# Patient Record
Sex: Male | Born: 1950 | Race: White | Hispanic: No | Marital: Married | Smoking: Never smoker
Health system: Southern US, Community
[De-identification: ages and names within clinical notes are randomized; demographics above are authoritative.]

## PROBLEM LIST (undated history)

## (undated) DIAGNOSIS — Z9089 Acquired absence of other organs: Secondary | ICD-10-CM

## (undated) DIAGNOSIS — G002 Streptococcal meningitis: Secondary | ICD-10-CM

## (undated) DIAGNOSIS — K5904 Chronic idiopathic constipation: Secondary | ICD-10-CM

## (undated) DIAGNOSIS — F329 Major depressive disorder, single episode, unspecified: Secondary | ICD-10-CM

## (undated) DIAGNOSIS — R51 Headache: Secondary | ICD-10-CM

## (undated) DIAGNOSIS — Z87898 Personal history of other specified conditions: Secondary | ICD-10-CM

## (undated) DIAGNOSIS — G20 Parkinson's disease: Secondary | ICD-10-CM

## (undated) DIAGNOSIS — J3489 Other specified disorders of nose and nasal sinuses: Secondary | ICD-10-CM

## (undated) DIAGNOSIS — F32A Depression, unspecified: Secondary | ICD-10-CM

## (undated) DIAGNOSIS — R011 Cardiac murmur, unspecified: Secondary | ICD-10-CM

## (undated) HISTORY — PX: VASECTOMY: SHX75

## (undated) HISTORY — DX: Chronic idiopathic constipation: K59.04

## (undated) HISTORY — PX: NASAL SINUS SURGERY: SHX719

## (undated) HISTORY — DX: Parkinson's disease: G20

## (undated) HISTORY — PX: COLONOSCOPY: SHX174

## (undated) HISTORY — DX: Other specified disorders of nose and nasal sinuses: J34.89

## (undated) HISTORY — DX: Streptococcal meningitis: G00.2

## (undated) HISTORY — DX: Acquired absence of other organs: Z90.89

## (undated) HISTORY — DX: Headache: R51

## (undated) HISTORY — DX: Cardiac murmur, unspecified: R01.1

## (undated) HISTORY — DX: Depression, unspecified: F32.A

## (undated) HISTORY — DX: Personal history of other specified conditions: Z87.898

## (undated) HISTORY — DX: Major depressive disorder, single episode, unspecified: F32.9

---
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
## 2006-10-23 ENCOUNTER — Ambulatory Visit: Payer: Self-pay | Admitting: Internal Medicine

## 2006-10-25 LAB — CONVERTED CEMR LAB
Alkaline Phosphatase: 69 units/L (ref 39–117)
BUN: 11 mg/dL (ref 6–23)
CO2: 29 meq/L (ref 19–32)
Cholesterol: 220 mg/dL (ref 0–200)
Creatinine, Ser: 1.3 mg/dL (ref 0.4–1.5)
Direct LDL: 157 mg/dL
HDL: 43.6 mg/dL (ref >39.0)
Potassium: 4.1 meq/L (ref 3.5–5.1)
Sodium: 141 meq/L (ref 135–145)
Total Bilirubin: 1 mg/dL (ref 0.3–1.2)
Total CHOL/HDL Ratio: 5
Triglycerides: 151 mg/dL — ABNORMAL HIGH (ref 0–149)

## 2006-11-09 ENCOUNTER — Ambulatory Visit: Payer: Self-pay | Admitting: Gastroenterology

## 2007-07-30 ENCOUNTER — Ambulatory Visit: Payer: Self-pay | Admitting: Internal Medicine

## 2007-07-30 LAB — CONVERTED CEMR LAB
Basophils Relative: 0.5 % (ref 0.0–1.0)
Eosinophils Relative: 4.5 % (ref 0.0–5.0)
Lymphocytes Relative: 18 % (ref 12.0–46.0)
MCHC: 34.8 g/dL (ref 30.0–36.0)
MCV: 87.2 fL (ref 78.0–100.0)
Monocytes Relative: 12.6 % — ABNORMAL HIGH (ref 3.0–11.0)
Neutro Abs: 5.4 10*3/uL (ref 1.4–7.7)
Neutrophils Relative %: 64.4 % (ref 43.0–77.0)
RBC: 4.48 M/uL (ref 4.22–5.81)
RDW: 12.7 % (ref 11.5–14.6)

## 2008-02-18 ENCOUNTER — Encounter: Payer: Self-pay | Admitting: *Deleted

## 2008-02-18 DIAGNOSIS — Z87898 Personal history of other specified conditions: Secondary | ICD-10-CM

## 2008-02-18 DIAGNOSIS — Z9089 Acquired absence of other organs: Secondary | ICD-10-CM

## 2008-02-18 DIAGNOSIS — R0789 Other chest pain: Secondary | ICD-10-CM

## 2008-02-18 HISTORY — DX: Personal history of other specified conditions: Z87.898

## 2008-02-18 HISTORY — DX: Acquired absence of other organs: Z90.89

## 2008-07-02 ENCOUNTER — Ambulatory Visit (HOSPITAL_COMMUNITY): Payer: Self-pay | Admitting: Psychiatry

## 2008-08-15 ENCOUNTER — Ambulatory Visit (HOSPITAL_COMMUNITY): Payer: Self-pay | Admitting: Psychiatry

## 2008-09-30 ENCOUNTER — Ambulatory Visit (HOSPITAL_COMMUNITY): Payer: Self-pay | Admitting: Psychiatry

## 2008-11-25 ENCOUNTER — Ambulatory Visit (HOSPITAL_COMMUNITY): Payer: Self-pay | Admitting: Psychiatry

## 2009-02-11 ENCOUNTER — Ambulatory Visit (HOSPITAL_COMMUNITY): Payer: Self-pay | Admitting: Psychiatry

## 2009-02-18 ENCOUNTER — Ambulatory Visit: Payer: Self-pay | Admitting: Endocrinology

## 2009-02-18 ENCOUNTER — Encounter: Payer: Self-pay | Admitting: Endocrinology

## 2009-02-18 ENCOUNTER — Telehealth (INDEPENDENT_AMBULATORY_CARE_PROVIDER_SITE_OTHER): Payer: Self-pay | Admitting: *Deleted

## 2009-02-18 DIAGNOSIS — R05 Cough: Secondary | ICD-10-CM

## 2009-02-18 DIAGNOSIS — J069 Acute upper respiratory infection, unspecified: Secondary | ICD-10-CM | POA: Insufficient documentation

## 2009-03-23 ENCOUNTER — Encounter: Payer: Self-pay | Admitting: Internal Medicine

## 2009-04-14 ENCOUNTER — Ambulatory Visit (HOSPITAL_COMMUNITY): Payer: Self-pay | Admitting: Psychiatry

## 2009-05-26 ENCOUNTER — Ambulatory Visit (HOSPITAL_COMMUNITY): Payer: Self-pay | Admitting: Psychiatry

## 2009-07-01 ENCOUNTER — Ambulatory Visit (HOSPITAL_COMMUNITY): Payer: Self-pay | Admitting: Psychiatry

## 2009-08-04 ENCOUNTER — Ambulatory Visit (HOSPITAL_COMMUNITY): Payer: Self-pay | Admitting: Psychiatry

## 2009-09-08 ENCOUNTER — Ambulatory Visit (HOSPITAL_COMMUNITY): Payer: Self-pay | Admitting: Psychiatry

## 2009-11-10 ENCOUNTER — Ambulatory Visit (HOSPITAL_COMMUNITY): Payer: Self-pay | Admitting: Psychiatry

## 2010-02-02 ENCOUNTER — Ambulatory Visit (HOSPITAL_COMMUNITY): Payer: Self-pay | Admitting: Psychiatry

## 2010-02-24 ENCOUNTER — Ambulatory Visit (HOSPITAL_COMMUNITY): Payer: Self-pay | Admitting: Psychiatry

## 2010-04-07 ENCOUNTER — Ambulatory Visit (HOSPITAL_COMMUNITY): Payer: Self-pay | Admitting: Psychiatry

## 2010-07-06 ENCOUNTER — Ambulatory Visit (HOSPITAL_COMMUNITY): Payer: Self-pay | Admitting: Psychiatry

## 2010-10-22 ENCOUNTER — Ambulatory Visit (HOSPITAL_COMMUNITY)
Admission: RE | Admit: 2010-10-22 | Discharge: 2010-10-22 | Payer: Self-pay | Source: Home / Self Care | Attending: Psychiatry | Admitting: Psychiatry

## 2010-11-15 ENCOUNTER — Ambulatory Visit (INDEPENDENT_AMBULATORY_CARE_PROVIDER_SITE_OTHER): Payer: BC Managed Care – PPO | Admitting: Internal Medicine

## 2010-11-15 ENCOUNTER — Other Ambulatory Visit: Payer: BC Managed Care – PPO

## 2010-11-15 ENCOUNTER — Encounter (INDEPENDENT_AMBULATORY_CARE_PROVIDER_SITE_OTHER): Payer: Self-pay | Admitting: *Deleted

## 2010-11-15 ENCOUNTER — Encounter: Payer: Self-pay | Admitting: Internal Medicine

## 2010-11-15 ENCOUNTER — Other Ambulatory Visit: Payer: Self-pay | Admitting: Internal Medicine

## 2010-11-15 DIAGNOSIS — G4710 Hypersomnia, unspecified: Secondary | ICD-10-CM | POA: Insufficient documentation

## 2010-11-15 LAB — CBC WITH DIFFERENTIAL/PLATELET
Basophils Relative: 0.5 % (ref 0.0–3.0)
Eosinophils Absolute: 0.3 10*3/uL (ref 0.0–0.7)
Eosinophils Relative: 4.6 % (ref 0.0–5.0)
Lymphs Abs: 2 10*3/uL (ref 0.7–4.0)
MCHC: 34.3 g/dL (ref 30.0–36.0)
Monocytes Absolute: 0.5 10*3/uL (ref 0.1–1.0)
Neutrophils Relative %: 52.2 % (ref 43.0–77.0)
Platelets: 279 10*3/uL (ref 150.0–400.0)
RBC: 4.33 Mil/uL (ref 4.22–5.81)

## 2010-11-15 LAB — LIPID PANEL
Cholesterol: 198 mg/dL (ref 0–200)
VLDL: 27.2 mg/dL (ref 0.0–40.0)

## 2010-11-15 LAB — BASIC METABOLIC PANEL
Calcium: 9.1 mg/dL (ref 8.4–10.5)
Creatinine, Ser: 1.1 mg/dL (ref 0.4–1.5)
GFR: 72.49 mL/min (ref >60.00)
Sodium: 141 mEq/L (ref 135–145)

## 2010-11-15 LAB — HEPATIC FUNCTION PANEL
Alkaline Phosphatase: 72 U/L (ref 39–117)
Bilirubin, Direct: 0.1 mg/dL (ref 0.0–0.3)
Total Bilirubin: 1 mg/dL (ref 0.3–1.2)

## 2010-11-16 ENCOUNTER — Encounter: Payer: Self-pay | Admitting: Internal Medicine

## 2010-11-16 LAB — TESTOSTERONE: Testosterone: 192.28 ng/dL — ABNORMAL LOW (ref 350.00–890.00)

## 2010-11-24 NOTE — Assessment & Plan Note (Signed)
Summary: sleeping 12-16 hrs a day/last ov 2008   Vital Signs:  Patient profile:   60 year old male Height:      76 inches Weight:      282 pounds BMI:     34.45 O2 Sat:      95 % on Room air Temp:     97.9 degrees F oral Pulse rate:   73 / minute BP sitting:   128 / 80  (left arm) Cuff size:   large  Vitals Entered By: Bill Salinas CMA (November 15, 2010 4:08 PM)  O2 Flow:  Room air CC: pt sleeping 16 hours a day at times/ ab   CC:  pt sleeping 16 hours a day at times/ ab.  History of Present Illness: Patient presents due to excessive somnolence. He is sleeping 16 hrs a day. He reports that he is never feeling well resting. His wife reports that he snores and has breath holding. She is concerned that he has sleep apnea. He is followed by psychiatry for depression and possibly narcolepsy vs shift work sleep disorder. He is taking nuvigil after work before sleep. He is uncertain as to his diagnoses. His care is provided by Jorje Guild PA-c, who is requesting lab - CBC, CMet testosterone level.   He reports that his health has been fine. No major medical illness.   Current Medications (verified): 1)  Bupropion 200mg  .... 1 Tablet Two Times A Day 2)  Celexa 20 Mg Tabs (Citalopram Hydrobromide) .Marland Kitchen.. 1 Tab Daily 3)  Nuvigil 4)  Deplin 1mg  .... 1 Tab Once Daily  Allergies: No Known Drug Allergies  Past History:  Past Medical History: Last updated: 02/18/2008 Hx of CHEST TIGHTNESS (ICD-786.59) * Hx of BRIGHT RED BLOOD WITH STOOL HEADACHES, HX OF (ICD-V13.8) CARDIAC MURMUR, HX OF AS A CHILD (ICD-V12.50) Hx of STREPTOCOCCAL MENINGITIS AS AN INFANT. (ICD-320.2)    Past Surgical History: Last updated: 02/18/2008 * Hx of LACERATION IN THE SUBPATELLAR REGION WITH SUTURE REPAIR * Hx of SINUS SURGERY X4 AS A CHILD VASECTOMY, HX OF (ICD-V26.52) APPENDECTOMY, HX OF (ICD-V45.79)  Review of Systems  The patient denies anorexia, fever, weight loss, weight gain, vision loss, decreased  hearing, syncope, dyspnea on exertion, peripheral edema, headaches, abdominal pain, hematochezia, muscle weakness, and difficulty walking.    Physical Exam  General:  alert, well-developed, well-nourished, well-hydrated, and normal appearance.   Head:  normocephalic and atraumatic.   Eyes:  pupils equal and pupils round.  C&S clear Neck:  supple.   Lungs:  normal respiratory effort and normal breath sounds.   Heart:  normal rate and regular rhythm.   Neurologic:  flat affect. appears somnolent. No focal findings   Impression & Recommendations:  Problem # 1:  HYPERSOMNIA, PERSISTENT (ICD-307.44) Patient with severe hypersomnolence. Question of psychologic vs shift work sleep disorder vs sleep apnea.  Plan - r/o metabolic causes           set up for sleep study.   Orders: Sleep Study (Sleep Study) TLB-Lipid Panel (80061-LIPID) TLB-BMP (Basic Metabolic Panel-BMET) (80048-METABOL) TLB-CBC Platelet - w/Differential (85025-CBCD) TLB-TSH (Thyroid Stimulating Hormone) (84443-TSH) TLB-Hepatic/Liver Function Pnl (80076-HEPATIC) TLB-B12 + Folate Pnl (52841_32440-N02/VOZ) TLB-Testosterone, Total (84403-TESTO)  labs are normal except for low testosterone.   Complete Medication List: 1)  Bupropion 200mg   .... 1 tablet two times a day 2)  Celexa 20 Mg Tabs (Citalopram hydrobromide) .Marland Kitchen.. 1 tab daily 3)  Nuvigil  4)  Deplin 1mg   .... 1 tab once daily  Orders Added: 1)  Sleep Study [Sleep Study] 2)  TLB-Lipid Panel [80061-LIPID] 3)  TLB-BMP (Basic Metabolic Panel-BMET) [80048-METABOL] 4)  TLB-CBC Platelet - w/Differential [85025-CBCD] 5)  TLB-TSH (Thyroid Stimulating Hormone) [84443-TSH] 6)  TLB-Hepatic/Liver Function Pnl [80076-HEPATIC] 7)  TLB-B12 + Folate Pnl [82746_82607-B12/FOL] 8)  TLB-Testosterone, Total [84403-TESTO] 9)  Est. Patient Level III [40981]

## 2010-11-24 NOTE — Letter (Signed)
[facility] [address] [place], [place]  [address] Phone: [phone]      November 16, 2010   [facility] [address] [place], [place] [address]  RE:  LAB RESULTS  Dear  [name] [name],  The following is an interpretation of your most recent lab tests.  Please take note of any instructions provided or changes to medications that have resulted from your lab work.  ELECTROLYTES:  Good - no changes needed  KIDNEY FUNCTION TESTS:  Good - no changes needed  LIVER FUNCTION TESTS:  Good - no changes needed  Health professionals look at cholesterol as more involved than just the total cholesterol. We consider the level of LDL (bad) cholesterol, HDL (good), cholesterol, and Triglycerides (Grease) in the blood.  1. Your LDL should be under 100, and the HDL should be over 45, if you have any vascular disease such as heart attack, angina, stroke, TIA (mini stroke), claudication (pain in the legs when you walk due to poor circulation),  Abdominal Aortic Aneurysm (AAA), diabetes or prediabetes.  2. Your LDL should be under 130 if you have any two of the following:     a. Smoke or chew tobacco,     b. High blood pressure (if you are on medication or over 140/90 without medication),     c. Male gender,    d. HDL below 40,    e. A male relative (father, brother, or son), who have had any vascular event          as described in #1. above under the age of 37, or a male relative (mother,       sister, or daughter) who had an event as described above under age 69. (An HDL over 60 will subtract one risk factor from the total, so if you have two items in # 2 above, but an HDL over 60, you then fall into category # 3 below).  3. Your LDL should be under 160 if you have any one of the above.  Triglycerides should be under 200 with the ideal being under 150.  For diabetes or pre-diabetes, the ideal HgbA1C should be under 6.0%.  If you fall into any of the above categories,  you should make a follow up appointment to discuss this with your physician.  LIPID PANEL:  Good - no changes needed Triglyceride: 136.0   Cholesterol: 198   LDL: 121   HDL: 50.10   Chol/HDL%:  4  THYROID STUDIES:  Thyroid studies normal TSH: 1.16     DIABETIC STUDIES:  Excellent - no changes needed Blood Glucose: 96    CBC:  Good - no changes needed B12 is normal. Testosterone is low.  Labs look good but testosterone is low. I do not think this explains your somnolence but we can discuss treatment after your workup is complete.  Please come see me if you have any questions about these lab results.   Sincerely Yours,    Jacques Navy MD  Patient: [name] [name] Note: All result statuses are Final unless otherwise noted.  Tests: (1) Lipid Panel (LIPID)   Cholesterol               198 mg/dL                   3-086     ATP III Classification            Desirable:  < 200 mg/dL  Borderline High:  200 - 239 mg/dL               High:  > = 240 mg/dL   Triglycerides             136.0 mg/dL                 1.6-109.6     Normal:  <150 mg/dL     Borderline High:  045 - 199 mg/dL   HDL                       40.98 mg/dL                 >11.91   VLDL Cholesterol          27.2 mg/dL                  4.7-82.9   LDL Cholesterol      [H]  562 mg/dL                   1-30  CHO/HDL Ratio:  CHD Risk                             4                    Men          Women     1/2 Average Risk     3.4          3.3     Average Risk          5.0          4.4     2X Average Risk          9.6          7.1     3X Average Risk          15.0          11.0                           Tests: (2) BMP (METABOL)   Sodium                    141 mEq/L                   135-145   Potassium                 4.1 mEq/L                   3.5-5.1   Chloride                  101 mEq/L                   96-112   Carbon Dioxide            28 mEq/L                    19-32   Glucose                    96 mg/dL                    86-57   BUN  14 mg/dL                    1-61   Creatinine                1.1 mg/dL                   0.9-6.0   Calcium                   9.1 mg/dL                   4.5-40.9   GFR                       72.49 mL/min                >60.00  Tests: (3) CBC Platelet w/Diff (CBCD)   White Cell Count          5.9 K/uL                    4.5-10.5   Red Cell Count            4.33 Mil/uL                 4.22-5.81   Hemoglobin                13.4 g/dL                   81.1-91.4   Hematocrit                39.0 %                      39.0-52.0   MCV                       90.1 fl                     78.0-100.0   MCHC                      34.3 g/dL                   78.2-95.6   RDW                       13.5 %                      11.5-14.6   Platelet Count            279.0 K/uL                  150.0-400.0   Neutrophil %              52.2 %                      43.0-77.0   Lymphocyte %              34.4 %                      12.0-46.0   Monocyte %                8.3 %  3.0-12.0   Eosinophils%              4.6 %                       0.0-5.0   Basophils %               0.5 %                       0.0-3.0   Neutrophill Absolute      3.1 K/uL                    1.4-7.7   Lymphocyte Absolute       2.0 K/uL                    0.7-4.0   Monocyte Absolute         0.5 K/uL                    0.1-1.0  Eosinophils, Absolute                             0.3 K/uL                    0.0-0.7   Basophils Absolute        0.0 K/uL                    0.0-0.1  Tests: (4) TSH (TSH)   FastTSH                   1.16 uIU/mL                 0.35-5.50  Tests: (5) Hepatic/Liver Function Panel (HEPATIC)   Total Bilirubin           1.0 mg/dL                   1.6-1.0   Direct Bilirubin          0.1 mg/dL                   9.6-0.4   Alkaline Phosphatase      72 U/L                      39-117   AST                       18 U/L                       0-37   ALT                       18 U/L                      0-53   Total Protein             7.1 g/dL                    5.4-0.9   Albumin                   4.0 g/dL  3.5-5.2  Tests: (6) B12 + Folate Panel (B12/FOL)   Vitamin B12               274 pg/mL                   211-911   Folate                    >24.8 ng/mL                 >5.9  Tests: (7) Testosterone, Total (TESTO)   Testosterone         [L]  192.28 ng/dL                578.46-962.95

## 2010-12-07 ENCOUNTER — Encounter (HOSPITAL_BASED_OUTPATIENT_CLINIC_OR_DEPARTMENT_OTHER): Payer: BC Managed Care – PPO

## 2010-12-13 ENCOUNTER — Ambulatory Visit (HOSPITAL_BASED_OUTPATIENT_CLINIC_OR_DEPARTMENT_OTHER): Payer: BC Managed Care – PPO | Attending: Internal Medicine

## 2010-12-13 DIAGNOSIS — G4733 Obstructive sleep apnea (adult) (pediatric): Secondary | ICD-10-CM | POA: Insufficient documentation

## 2010-12-28 ENCOUNTER — Telehealth: Payer: Self-pay | Admitting: Internal Medicine

## 2010-12-28 DIAGNOSIS — G4733 Obstructive sleep apnea (adult) (pediatric): Secondary | ICD-10-CM

## 2010-12-28 NOTE — Telephone Encounter (Signed)
patinet had a positive sleep study with AHI of 67 - needs to see Dr. Shelle Iron for treatment planning, i.e. CPAP

## 2010-12-28 NOTE — Telephone Encounter (Signed)
Returned call to pt, lmovm advising per MD. I advised that he will receive a call with referral info

## 2010-12-31 ENCOUNTER — Encounter (HOSPITAL_COMMUNITY): Payer: BC Managed Care – PPO | Admitting: Physician Assistant

## 2010-12-31 DIAGNOSIS — F332 Major depressive disorder, recurrent severe without psychotic features: Secondary | ICD-10-CM

## 2011-01-13 ENCOUNTER — Encounter: Payer: Self-pay | Admitting: Internal Medicine

## 2011-01-13 ENCOUNTER — Telehealth: Payer: Self-pay | Admitting: *Deleted

## 2011-01-13 DIAGNOSIS — G4733 Obstructive sleep apnea (adult) (pediatric): Secondary | ICD-10-CM

## 2011-01-13 NOTE — Telephone Encounter (Signed)
Done-ref to pul

## 2011-01-13 NOTE — Telephone Encounter (Signed)
Pt's wife called pulmonary very upset that an apt was not made w/them. No referral was put in for pulmonary. I gave verbal referral and pt has apt for Monday at 9:15 (arrive at 9am). Pt's wife aware of apt - please put in referral so pulmonary can see order. Thanks

## 2011-01-14 ENCOUNTER — Encounter: Payer: Self-pay | Admitting: Pulmonary Disease

## 2011-01-17 ENCOUNTER — Ambulatory Visit (INDEPENDENT_AMBULATORY_CARE_PROVIDER_SITE_OTHER): Payer: BC Managed Care – PPO | Admitting: Pulmonary Disease

## 2011-01-17 ENCOUNTER — Encounter: Payer: Self-pay | Admitting: Pulmonary Disease

## 2011-01-17 VITALS — BP 120/84 | HR 77 | Temp 98.5°F | Ht 76.0 in | Wt 285.8 lb

## 2011-01-17 DIAGNOSIS — G4733 Obstructive sleep apnea (adult) (pediatric): Secondary | ICD-10-CM | POA: Insufficient documentation

## 2011-01-17 NOTE — Progress Notes (Signed)
  Subjective:    Patient ID: [name] [name], male    DOB: 1951/03/23, 60 y.o.   MRN: [id number]  HPI The pt is a 59y/o male who I have been asked to see for management of osa.  He recently underwent npsg where he was found to have an AHI 67/hr with desats to 83%.  The pt has been noted to have loud snoring, but no one has ever commented on an abnormal breathing pattern during sleep.  However, he sleeps during the day when no one is around.  He has non restorative sleep, but surprisingly denies any significant sleepiness during the day with periods of inactivity.  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 His weight is up 25 pounds + over the last 2 years.  His most recent epworth score is 13.  Please see sleep questionnaire below for sleep hygiene.    Review of Systems  Constitutional: Negative for fever and unexpected weight change.  HENT: Negative for ear pain, nosebleeds, congestion, sore throat, rhinorrhea, sneezing, trouble swallowing, dental problem, postnasal drip and sinus pressure.   Eyes: Negative for redness and itching.  Respiratory: Negative for cough, chest tightness, shortness of breath and wheezing.   Cardiovascular: Negative for palpitations and leg swelling.  Gastrointestinal: Negative for nausea and vomiting.  Genitourinary: Negative for dysuria.  Musculoskeletal: Negative for joint swelling.  Skin: Negative for rash.  Neurological: Negative for headaches.  Hematological: Does not bruise/bleed easily.  Psychiatric/Behavioral: Positive for dysphoric mood. The patient is not nervous/anxious.        Objective:   Physical Exam Constitutional: OW male, no acute distress  HENT: deviated septum to right with narrowing without discharge  Oropharynx without exudate, palate and uvula are long and thick  Eyes:  Perrla, eomi, no scleral icterus  Neck:  No JVD, no TMG  Cardiovascular:  Normal rate, regular rhythm, no rubs or gallops.  No murmurs  Intact distal pulses  Pulmonary :  Normal breath sounds, no stridor or respiratory distress   No rales, rhonchi, or wheezing  Abdominal:  Soft, nondistended, bowel sounds present.  No tenderness noted.   Musculoskeletal:  Mild lower extremity edema noted.  Lymph Nodes:  No cervical lymphadenopathy noted  Skin:  No cyanosis noted  Neurologic:  Alert, appropriate, moves all 4 extremities without obvious deficit.         Assessment & Plan:

## 2011-01-17 NOTE — Patient Instructions (Signed)
Will start you on cpap at moderate pressure to allow for desensitization.  Please call if tolerance issues. Work on weight loss followup with me in 5weeks.

## 2011-01-17 NOTE — Assessment & Plan Note (Signed)
The pt has severe osa by his sleep study that will require treatment with cpap while working on weight loss.  I have had a long discussion with the pt about sleep apnea, including its impact on QOL and CV health.  He is willing to give cpap a try.  I will set the patient up on cpap at a moderate pressure level to allow for desensitization, and will troubleshoot the device over the next 4-6weeks if needed.  The pt is to call me if having issues with tolerance.  Will then optimize the pressure once patient is able to wear cpap on a consistent basis.

## 2011-02-07 ENCOUNTER — Institutional Professional Consult (permissible substitution): Payer: BC Managed Care – PPO | Admitting: Pulmonary Disease

## 2011-02-14 ENCOUNTER — Encounter: Payer: Self-pay | Admitting: Endocrinology

## 2011-02-18 NOTE — Assessment & Plan Note (Signed)
[facility]                           PRIMARY CARE OFFICE NOTE   NAME:[name] [name]                    MRN:          [id number]  DATE:10/23/2006                            DOB:          Jul 08, 1951    [name] [name] is [name] 60 year old Caucasian [name] who presents to  establish as [name] [name].   CHIEF COMPLAINT:  1. The [name] has [name] concern for diabetes.  2. Chest tightness which the [name] says is stress related, it is      nonexertional and does not radiate to his arm or his neck and is      not accompanied shortness of breath or diaphoresis.  3. Derm. The [name] is concerned about [name] spot on his back which is      becoming enlarged and inflamed and is somewhat tender.   PAST MEDICAL HISTORY:   SURGICAL:  1. Sinus surgery x4 as [name] [name].  2. Appendectomy in 1968.  3. Vasectomy in 1991.   TRAUMA:  The [name] had [name] major laceration in the subpatellar region of  the left leg requiring suture repair which occurred as [name] [name].   MEDICAL:  1. The [name] had the usual childhood diseases.  2. Spinal meningitis as [name] newborn.  3. Cardiac murmur diagnosed as [name] [name].  4. Frequent headaches daily over the last 6-8 months with [name]      frontotemporal [name].  5. History of bright red blood with stool.   CURRENT MEDICATIONS:  1. Bupropion SR 150 mg b.i.d.  2. Lexapro 30 mg daily.   PHYSICIAN ROSTER:  Dr. Jamas [name] for psychiatry.   MEDICATION ALLERGIES:  None.   FAMILY HISTORY:  Positive for alcoholism in [name] [name]. Father  had lung cancer and was [name] smoker. This has just been diagnosed. The  father is 83. Mother is 26, she has glaucoma. The [name] has older  brother who has scoliosis, detached retina, history of addictions in the  past, [name] younger sister whose in good health. There is [name] family history  for dementia and Alzheimer's in the grandparents.   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 [name] [name] has been with [employer] as [name] [name] for the  last 15 years. [name] [name] does work [name] [name]. [name] [name] has been married 30 years,  [name] [name] has 2 daughters age 30 and 77, [name] son age 76. [name] [name] reports his marriage  is in good health. The [name] has no significant unusual stressors.   HABITS:  Tobacco none. Alcohol none. No recreational drug use.   REVIEW OF SYSTEMS:  Negative for any constitutional problems. No  ophthalmology, ENT, cardiovascular, respiratory, GI or GU complaints.  MUSCULOSKELETAL:  Significant for chest tightness as noted. DERM:  Significant for an area of concern on his back.   PHYSICAL EXAMINATION:  VITAL SIGNS:  Temperature was 98.8, blood  pressure 133/77, pulse 74, weight 282, height 6 foot.  GENERAL:  This is [name] heavy set Caucasian male in no acute distress.  HEENT:  Normocephalic, atraumatic. EACs and TMs are normal. Oropharynx  with native dentition in good repair. No buccal or palatal lesions were  noted. The posterior pharynx was clear. Conjunctiva and sclera was  clear. PERRLA. EOMI. Funduscopic exam was unremarkable.  NECK:  Supple without thyromegaly.  NODES:  No adenopathy was noted in the cervical or supraclavicular  regions.  CHEST:  No CVA tenderness. Lungs were clear to auscultation and  percussion.  CARDIOVASCULAR:  2+ radial pulses. No JVD or carotid bruits. [name] [name] had [name]  quiet precordium with [name] regular rate and rhythm without murmurs, rubs or  gallops.  ABDOMEN:  Soft, no guarding, no rebound. No organosplenomegaly was  appreciated.  GENITALIA:  Normal male phallus, bilaterally descended testicles without  masses. No sign of inguinal hernia.  RECTAL:  Revealed normal sphincter tone. Prostate is smooth, round,  normal size and contour without nodules.  EXTREMITIES:  Without clubbing, cyanosis or edema. No deformity was  noted.  DERM:  The [name] has an area which appears to be an inflamed comedone  on his back at the left flank. There is [name]  small scale over this.   [name] 12-lead electrocardiogram revealed [name] normal sinus rhythm with no  abnormalities noted. Laboratory ordered and pending and includes [name] lipid  panel, general comprehensive metabolic panel. PSAs were ordered.   ASSESSMENT/PLAN:  1. Chest tightness. I suspect this may be stress related. EKG is      normal, examination is unremarkable. Cardiac risk factors are low.      Plan is continued observation and we may need to address stress      issues at his next visit.  2. Derm. [name] with what appears to be an inflamed comedone. The      plan is warm compresses b.i.d. The [name] to use covering dressing      to avoid abrasion. This should resolve on its own but if not can      always have this incised.  3. Health maintenance. The [name]'s laboratories were ordered and      pending including [name] check for diabetes. EKG was unremarkable. The      [name] is [name] candidate for colorectal cancer screening and will be      referred to GI for the same.   SUMMARY:  [name] [name] is [name] pleasant [name] who seems to be medically stable.  [name] [name] will return for laboratory and be notified by phone tree. [name] [name] should  be contacted by GI for scheduling of colonoscopy. [name] [name] is to return if the  place on his back does not improve or if his chest tightness continues.     [name] Gess Norins, MD  Electronically Signed    MEN/MedQ  DD: 10/24/2006  DT: 10/24/2006  Job #: [id number]   cc:   [name] [name]

## 2011-02-22 ENCOUNTER — Encounter: Payer: Self-pay | Admitting: Pulmonary Disease

## 2011-02-22 ENCOUNTER — Ambulatory Visit (INDEPENDENT_AMBULATORY_CARE_PROVIDER_SITE_OTHER): Payer: BC Managed Care – PPO | Admitting: Pulmonary Disease

## 2011-02-22 VITALS — BP 136/74 | HR 80 | Temp 98.5°F | Ht 76.0 in | Wt 282.0 lb

## 2011-02-22 DIAGNOSIS — G4733 Obstructive sleep apnea (adult) (pediatric): Secondary | ICD-10-CM

## 2011-02-22 NOTE — Assessment & Plan Note (Signed)
The pt has been wearing cpap compliantly, but has not seen a big difference in his sleep or alertness.  I have reminded him that we have yet to optimize his pressure for him, and this may make a significant difference.  He also works third shift, and this may contribute to his "daytime symptoms".  Will see how he responds before considering other issues.

## 2011-02-22 NOTE — Patient Instructions (Signed)
Will have your machine set on auto for 2 weeks to optimize your pressure.  Will let you know the results once I receive your download. Continue to work on weight loss followup with me in 6mos, but would like for you to call me 4 weeks after we have optimzed your pressure to give me some feedback as to how things are going on the new pressure.

## 2011-02-22 NOTE — Progress Notes (Signed)
  Subjective:    Patient ID: [name] [name], male    DOB: 10-05-1950, 60 y.o.   MRN: [id number]  HPI The pt comes in today for f/u of his severe osa.  He was started on cpap at the last visit, and denies any issues with mask fit or pressure.  He is wearing compliantly, but does not feel it has made a significant difference to his sleep and alertness while awake.  He is a third shift worker, and this can contribute to his symptoms.     Review of Systems  Constitutional: Negative for fever and unexpected weight change.  HENT: Positive for congestion and sneezing. Negative for ear pain, nosebleeds, sore throat, rhinorrhea, trouble swallowing, dental problem, postnasal drip and sinus pressure.   Eyes: Negative for redness and itching.  Respiratory: Negative for cough, chest tightness, shortness of breath and wheezing.   Cardiovascular: Negative for palpitations and leg swelling.  Gastrointestinal: Negative for nausea and vomiting.  Genitourinary: Negative for dysuria.  Musculoskeletal: Negative for joint swelling.  Skin: Negative for rash.  Neurological: Negative for headaches.  Hematological: Does not bruise/bleed easily.  Psychiatric/Behavioral: Positive for dysphoric mood. The patient is not nervous/anxious.        Objective:   Physical Exam Ow male in nad No skin breakdown or pressure necrosis from cpap mask Cor with rrr LE with mild edema, no cyanosis Alert and oriented, does not appear overly sleepy, moves all 4        Assessment & Plan:

## 2011-03-01 ENCOUNTER — Encounter (HOSPITAL_COMMUNITY): Payer: BC Managed Care – PPO | Admitting: Physician Assistant

## 2011-03-01 DIAGNOSIS — F332 Major depressive disorder, recurrent severe without psychotic features: Secondary | ICD-10-CM

## 2011-05-10 ENCOUNTER — Encounter (HOSPITAL_COMMUNITY): Payer: BC Managed Care – PPO | Admitting: Physician Assistant

## 2011-05-10 DIAGNOSIS — F332 Major depressive disorder, recurrent severe without psychotic features: Secondary | ICD-10-CM

## 2011-05-18 ENCOUNTER — Encounter (HOSPITAL_COMMUNITY): Payer: BC Managed Care – PPO | Admitting: Physician Assistant

## 2011-05-19 ENCOUNTER — Encounter (HOSPITAL_COMMUNITY): Payer: BC Managed Care – PPO | Admitting: Physician Assistant

## 2011-05-19 DIAGNOSIS — F332 Major depressive disorder, recurrent severe without psychotic features: Secondary | ICD-10-CM

## 2011-06-08 ENCOUNTER — Encounter (INDEPENDENT_AMBULATORY_CARE_PROVIDER_SITE_OTHER): Payer: BC Managed Care – PPO | Admitting: Physician Assistant

## 2011-06-08 DIAGNOSIS — F332 Major depressive disorder, recurrent severe without psychotic features: Secondary | ICD-10-CM

## 2011-06-12 ENCOUNTER — Other Ambulatory Visit: Payer: Self-pay | Admitting: Pulmonary Disease

## 2011-06-12 DIAGNOSIS — G4733 Obstructive sleep apnea (adult) (pediatric): Secondary | ICD-10-CM

## 2011-07-05 ENCOUNTER — Encounter (INDEPENDENT_AMBULATORY_CARE_PROVIDER_SITE_OTHER): Payer: BC Managed Care – PPO | Admitting: Physician Assistant

## 2011-07-05 DIAGNOSIS — F332 Major depressive disorder, recurrent severe without psychotic features: Secondary | ICD-10-CM

## 2011-07-21 ENCOUNTER — Other Ambulatory Visit: Payer: Self-pay | Admitting: *Deleted

## 2011-07-21 ENCOUNTER — Other Ambulatory Visit (INDEPENDENT_AMBULATORY_CARE_PROVIDER_SITE_OTHER): Payer: BC Managed Care – PPO

## 2011-07-21 DIAGNOSIS — F332 Major depressive disorder, recurrent severe without psychotic features: Secondary | ICD-10-CM

## 2011-07-21 DIAGNOSIS — Z79899 Other long term (current) drug therapy: Secondary | ICD-10-CM

## 2011-07-21 LAB — CBC WITH DIFFERENTIAL/PLATELET
Eosinophils Relative: 2.7 % (ref 0.0–5.0)
HCT: 43.8 % (ref 39.0–52.0)
Hemoglobin: 14.7 g/dL (ref 13.0–17.0)
Lymphs Abs: 2 10*3/uL (ref 0.7–4.0)
Monocytes Relative: 5.8 % (ref 3.0–12.0)
Platelets: 317 10*3/uL (ref 150.0–400.0)
RBC: 4.81 Mil/uL (ref 4.22–5.81)
WBC: 7.9 10*3/uL (ref 4.5–10.5)

## 2011-07-21 LAB — LIPID PANEL
HDL: 49.9 mg/dL (ref >39.00)
Total CHOL/HDL Ratio: 4
VLDL: 20.4 mg/dL (ref 0.0–40.0)

## 2011-07-21 LAB — COMPREHENSIVE METABOLIC PANEL
Albumin: 4 g/dL (ref 3.5–5.2)
CO2: 27 mEq/L (ref 19–32)
GFR: 67.39 mL/min (ref >60.00)
Glucose, Bld: 84 mg/dL (ref 70–99)
Sodium: 140 mEq/L (ref 135–145)
Total Bilirubin: 1 mg/dL (ref 0.3–1.2)
Total Protein: 7.1 g/dL (ref 6.0–8.3)

## 2011-07-21 LAB — PSA: PSA: 1.15 ng/mL (ref 0.10–4.00)

## 2011-07-21 LAB — HEMOGLOBIN A1C: Hgb A1c MFr Bld: 5.9 % (ref 4.6–6.5)

## 2011-07-21 NOTE — Progress Notes (Signed)
Pt here requesting labs for order from Jorje Guild, PA-C at [facility] Fax labs to [phone]. Labs entered.

## 2011-07-24 ENCOUNTER — Encounter: Payer: Self-pay | Admitting: Internal Medicine

## 2011-07-26 ENCOUNTER — Encounter (HOSPITAL_COMMUNITY): Payer: BC Managed Care – PPO | Admitting: Physician Assistant

## 2011-08-02 ENCOUNTER — Encounter (INDEPENDENT_AMBULATORY_CARE_PROVIDER_SITE_OTHER): Payer: BC Managed Care – PPO | Admitting: Physician Assistant

## 2011-08-02 DIAGNOSIS — F332 Major depressive disorder, recurrent severe without psychotic features: Secondary | ICD-10-CM

## 2011-08-09 ENCOUNTER — Encounter (HOSPITAL_COMMUNITY): Payer: BC Managed Care – PPO | Admitting: Physician Assistant

## 2011-08-23 ENCOUNTER — Ambulatory Visit: Payer: BC Managed Care – PPO | Admitting: Pulmonary Disease

## 2011-08-23 ENCOUNTER — Other Ambulatory Visit (HOSPITAL_COMMUNITY): Payer: Self-pay

## 2011-08-24 ENCOUNTER — Encounter (HOSPITAL_COMMUNITY): Payer: Self-pay | Admitting: *Deleted

## 2011-08-24 ENCOUNTER — Ambulatory Visit (INDEPENDENT_AMBULATORY_CARE_PROVIDER_SITE_OTHER): Payer: BC Managed Care – PPO | Admitting: Physician Assistant

## 2011-08-24 DIAGNOSIS — F332 Major depressive disorder, recurrent severe without psychotic features: Secondary | ICD-10-CM

## 2011-08-24 MED ORDER — TESTOSTERONE CYPIONATE 200 MG/ML IM SOLN
300.0000 mg | INTRAMUSCULAR | Status: AC
  Administered 2011-08-24: 300 mg via INTRAMUSCULAR

## 2011-08-24 MED ORDER — CITALOPRAM HYDROBROMIDE 20 MG PO TABS: 20.0000 mg | ORAL_TABLET | Freq: Every day | ORAL | Status: DC

## 2011-08-24 MED ORDER — L-METHYLFOLATE 15 MG PO TABS: 15.0000 mg | ORAL_TABLET | Freq: Every day | ORAL | Status: DC

## 2011-08-24 MED ORDER — BUPROPION HCL ER (XL) 300 MG PO TB24: 300.0000 mg | ORAL_TABLET | Freq: Every day | ORAL | Status: DC

## 2011-08-24 MED ORDER — TESTOSTERONE CYPIONATE 200 MG/ML IM SOLN: 300.0000 mg | INTRAMUSCULAR | Status: DC

## 2011-08-24 NOTE — Progress Notes (Signed)
   [facility] Follow-up Outpatient Visit  [name] [name] 01-13-1951  Date: 08/24/11    Subjective: Pt reports feeling about the same.  States his energy may be some improved, but attributes that to being off work for 10 days.  Went to [place] to visit his 30-yo daughter.  Continues to endorse improved labido, but denies improvement in mood.  There were no vitals filed for this visit.  Mental Status Examination  Appearance: Casual Alert: Yes Attention: good  Cooperative: Yes Eye Contact: Good Speech: Clear and even Psychomotor Activity: Normal Memory/Concentration: Intact Oriented: person, place, time/date and situation Mood: Depressed, but improving Affect: Restricted Thought Processes and Associations: Linear Fund of Knowledge: Good Thought Content:  Insight: Poor Judgement: Fair  Diagnosis: Major depressive disorder  Treatment Plan: Continue current medications.  Return in three weeks for injection.  Order labs at that time.  Selenne Coggin, PA

## 2011-08-24 NOTE — Progress Notes (Signed)
Addended by: Tonny Bollman on: 08/24/2011 06:09 PM   Modules accepted: Orders

## 2011-09-14 ENCOUNTER — Ambulatory Visit (INDEPENDENT_AMBULATORY_CARE_PROVIDER_SITE_OTHER): Payer: BC Managed Care – PPO | Admitting: *Deleted

## 2011-09-14 ENCOUNTER — Other Ambulatory Visit (HOSPITAL_COMMUNITY): Payer: Self-pay | Admitting: *Deleted

## 2011-09-14 DIAGNOSIS — F332 Major depressive disorder, recurrent severe without psychotic features: Secondary | ICD-10-CM

## 2011-09-14 DIAGNOSIS — Z79899 Other long term (current) drug therapy: Secondary | ICD-10-CM

## 2011-09-14 DIAGNOSIS — F329 Major depressive disorder, single episode, unspecified: Secondary | ICD-10-CM

## 2011-09-14 MED ORDER — TESTOSTERONE CYPIONATE 200 MG/ML IM SOLN: 300.0000 mg | INTRAMUSCULAR | Status: AC

## 2011-09-14 MED ORDER — TESTOSTERONE CYPIONATE 200 MG/ML IM SOLN: 300.0000 mg | INTRAMUSCULAR | Status: DC

## 2011-09-14 NOTE — Progress Notes (Signed)
   [facility] Follow-up Outpatient Visit  [name] [name] 1951/05/18  Date: 09/14/11   Subjective: [name] [name] comes in today and reports that he is rather sad. He states that this time of year that is common for him. He reports that his sleep and appetite are good. He denies any homicidal or suicidal thoughts. He denies any auditory or visual hallucinations. He has returned to work since his last visit, and that is a stressor for him. He reports that things at home are "about the same."  There were no vitals filed for this visit.  Mental Status Examination  Appearance: Well groomed and casually dressed Alert: Yes Attention: good  Cooperative: Yes Eye Contact: Fair Speech: Clear and even Psychomotor Activity: Decreased Memory/Concentration: Intact Oriented: person, place, time/date and situation Mood: Depressed Affect: Restricted Thought Processes and Associations: Logical Fund of Knowledge: Fair Thought Content:  Insight: Fair Judgement: Good  Diagnosis: Maj. depressive disorder, recurrent, severe  Treatment Plan: [name] [name] received an injection of testosterone while here for his appointment today. He has not had any lab work performed recently, so we will order that to be done before his next visit. We will continue his other medications, as well as the testosterone as currently prescribed. He will return for followup in 3 weeks.  Indigo Barbian, PA

## 2011-10-05 ENCOUNTER — Other Ambulatory Visit (HOSPITAL_COMMUNITY): Payer: Self-pay | Admitting: *Deleted

## 2011-10-05 ENCOUNTER — Other Ambulatory Visit: Payer: Self-pay | Admitting: *Deleted

## 2011-10-05 ENCOUNTER — Other Ambulatory Visit: Payer: Self-pay | Admitting: Endocrinology

## 2011-10-05 ENCOUNTER — Ambulatory Visit (INDEPENDENT_AMBULATORY_CARE_PROVIDER_SITE_OTHER): Payer: BC Managed Care – PPO | Admitting: *Deleted

## 2011-10-05 ENCOUNTER — Other Ambulatory Visit (INDEPENDENT_AMBULATORY_CARE_PROVIDER_SITE_OTHER): Payer: BC Managed Care – PPO

## 2011-10-05 DIAGNOSIS — F339 Major depressive disorder, recurrent, unspecified: Secondary | ICD-10-CM

## 2011-10-05 DIAGNOSIS — Z0000 Encounter for general adult medical examination without abnormal findings: Secondary | ICD-10-CM

## 2011-10-05 LAB — COMPREHENSIVE METABOLIC PANEL
ALT: 23 U/L (ref 0–53)
Albumin: 3.9 g/dL (ref 3.5–5.2)
Alkaline Phosphatase: 63 U/L (ref 39–117)
Potassium: 4.4 mEq/L (ref 3.5–5.1)
Sodium: 141 mEq/L (ref 135–145)
Total Bilirubin: 0.6 mg/dL (ref 0.3–1.2)
Total Protein: 7 g/dL (ref 6.0–8.3)

## 2011-10-05 LAB — CBC WITH DIFFERENTIAL/PLATELET
Basophils Absolute: 0 10*3/uL (ref 0.0–0.1)
HCT: 45.8 % (ref 39.0–52.0)
Lymphs Abs: 1.5 10*3/uL (ref 0.7–4.0)
MCV: 89.8 fl (ref 78.0–100.0)
Monocytes Absolute: 0.4 10*3/uL (ref 0.1–1.0)
Monocytes Relative: 7.6 % (ref 3.0–12.0)
Neutrophils Relative %: 60.2 % (ref 43.0–77.0)
Platelets: 282 10*3/uL (ref 150.0–400.0)
RDW: 14 % (ref 11.5–14.6)
WBC: 5.5 10*3/uL (ref 4.5–10.5)

## 2011-10-05 LAB — LIPID PANEL
LDL Cholesterol: 102 mg/dL — ABNORMAL HIGH (ref 0–99)
Total CHOL/HDL Ratio: 4
VLDL: 39.2 mg/dL (ref 0.0–40.0)

## 2011-10-05 MED ORDER — TESTOSTERONE CYPIONATE 200 MG/ML IM SOLN: 300.0000 mg | INTRAMUSCULAR | Status: DC

## 2011-10-05 MED ORDER — TESTOSTERONE CYPIONATE 200 MG/ML IM SOLN: 300.0000 mg | Freq: Once | INTRAMUSCULAR | Status: DC

## 2011-10-05 MED ORDER — TESTOSTERONE CYPIONATE 200 MG/ML IM SOLN
300.0000 mg | Freq: Once | INTRAMUSCULAR | Status: AC
  Administered 2011-10-05: 300 mg via INTRAMUSCULAR

## 2011-10-05 NOTE — Progress Notes (Signed)
   [facility] Follow-up Outpatient Visit  WEILAND TOMICH 1950-11-27  Date: 10/05/11   Subjective: [name] [name] presents today to receive his testosterone injection and to followup on his depression. He endorses depressed mood with poor energy and low interest. He denies any suicidal or homicidal ideation. He denies any auditory or visual hallucinations. 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 His affect was significantly brighter. That was during a period of time when he had been off of work for 10 days. Kenshin admits that his job is a significant source of irritation, but has feelings of guilt when he takes time off from work because he does not get paid as well for his vacation time as he does when he works odd shifts.  There were no vitals filed for this visit.  Mental Status Examination  Appearance: Casual Alert: Yes Attention: good  Cooperative: Yes Eye Contact: Good Speech: Clear and even Psychomotor Activity: Decreased Memory/Concentration: Intact Oriented: person, place, time/date and situation Mood: Depressed Affect: Restricted Thought Processes and Associations: Linear Fund of Knowledge: Good Thought Content:  Insight: Good Judgement: Good  Diagnosis: Maj. depressive disorder, recurrent, severe  Treatment Plan: We will continue his medications as currently prescribed and encourage Jeriah to have his lab work performed at his primary care provider. He will return in 3 weeks for followup  Glendy Barsanti, PA

## 2011-10-05 NOTE — Progress Notes (Signed)
Addended by: Tonny Bollman on: 10/05/2011 05:41 PM   Modules accepted: Orders

## 2011-10-06 LAB — TESTOSTERONE: Testosterone: 142.04 ng/dL — ABNORMAL LOW (ref 350.00–890.00)

## 2011-10-06 LAB — PSA: PSA: 0.83 ng/mL (ref 0.10–4.00)

## 2011-10-17 ENCOUNTER — Other Ambulatory Visit (HOSPITAL_COMMUNITY): Payer: Self-pay | Admitting: Physician Assistant

## 2011-10-17 DIAGNOSIS — F332 Major depressive disorder, recurrent severe without psychotic features: Secondary | ICD-10-CM

## 2011-10-26 ENCOUNTER — Ambulatory Visit (INDEPENDENT_AMBULATORY_CARE_PROVIDER_SITE_OTHER): Payer: BC Managed Care – PPO | Admitting: *Deleted

## 2011-10-26 DIAGNOSIS — E291 Testicular hypofunction: Secondary | ICD-10-CM

## 2011-10-26 DIAGNOSIS — F331 Major depressive disorder, recurrent, moderate: Secondary | ICD-10-CM

## 2011-10-26 MED ORDER — TESTOSTERONE CYPIONATE 200 MG/ML IM SOLN: 400.0000 mg | INTRAMUSCULAR | Status: DC

## 2011-10-26 NOTE — Progress Notes (Signed)
   [facility] Follow-up Outpatient Visit  [name] [name] [birth year]  Date: 10/26/11   Subjective: [name] [name] presents today to followup on his depression. We reviewed his labs that were drawn almost 3 weeks ago, and his testosterone level had dropped significantly. He continues to complain of depressed mood and low energy. He is taking all his medications as prescribed. He denies any suicidal or homicidal ideation. He denies any auditory or visual hallucinations  There were no vitals filed for this visit.  Mental Status Examination  Appearance: Casual Alert: Yes Attention: good  Cooperative: Yes Eye Contact: Good Speech: Clear and even Psychomotor Activity: Decreased Memory/Concentration: Intact Oriented: person, place, time/date and situation Mood: Depressed Affect: Congruent and Constricted Thought Processes and Associations: Logical Fund of Knowledge: Good Thought Content:  Insight: Fair Judgement: Good  Diagnosis: Maj. depressive disorder, recurrent, severe  Treatment Plan: We will increase his testosterone injection to 400 mg every 3 weeks, and after 2 injections, including today's, we will check labs. He will come back for his next injection in 3 weeks. We will continue the Celexa at 40 mg at bedtime, as well as Wellbutrin XL 300 mg daily, and  L-Methylfolate 15 mg daily  Hani Campusano, PA

## 2011-10-27 MED ORDER — TESTOSTERONE CYPIONATE 200 MG/ML IM SOLN
400.0000 mg | INTRAMUSCULAR | Status: AC
  Administered 2011-10-26: 400 mg via INTRAMUSCULAR

## 2011-10-27 NOTE — Progress Notes (Signed)
Addended by: Tonny Bollman on: 10/27/2011 05:12 PM   Modules accepted: Orders

## 2011-11-16 ENCOUNTER — Encounter (HOSPITAL_COMMUNITY): Payer: Self-pay | Admitting: *Deleted

## 2011-11-16 ENCOUNTER — Ambulatory Visit (INDEPENDENT_AMBULATORY_CARE_PROVIDER_SITE_OTHER): Payer: BC Managed Care – PPO | Admitting: Physician Assistant

## 2011-11-16 ENCOUNTER — Other Ambulatory Visit (HOSPITAL_COMMUNITY): Payer: Self-pay

## 2011-11-16 DIAGNOSIS — Z79899 Other long term (current) drug therapy: Secondary | ICD-10-CM

## 2011-11-16 DIAGNOSIS — F332 Major depressive disorder, recurrent severe without psychotic features: Secondary | ICD-10-CM

## 2011-11-16 MED ORDER — LISDEXAMFETAMINE DIMESYLATE 50 MG PO CAPS: 50.0000 mg | ORAL_CAPSULE | ORAL | Status: DC

## 2011-11-16 NOTE — Progress Notes (Signed)
Vyvanse 50 mg authorized through [facility] for 09/15/11 (backdated) to 09/14/13.

## 2011-12-07 ENCOUNTER — Ambulatory Visit (INDEPENDENT_AMBULATORY_CARE_PROVIDER_SITE_OTHER): Payer: BC Managed Care – PPO | Admitting: Physician Assistant

## 2011-12-07 DIAGNOSIS — F339 Major depressive disorder, recurrent, unspecified: Secondary | ICD-10-CM

## 2011-12-07 DIAGNOSIS — F988 Other specified behavioral and emotional disorders with onset usually occurring in childhood and adolescence: Secondary | ICD-10-CM

## 2011-12-07 MED ORDER — LISDEXAMFETAMINE DIMESYLATE 70 MG PO CAPS: 70.0000 mg | ORAL_CAPSULE | ORAL | Status: DC

## 2011-12-07 MED ORDER — TESTOSTERONE CYPIONATE 200 MG/ML IM SOLN
400.0000 mg | INTRAMUSCULAR | Status: DC
  Administered 2011-12-07 – 2011-12-28 (×2): 400 mg via INTRAMUSCULAR
  Administered 2012-01-18: 300 mg via INTRAMUSCULAR

## 2011-12-15 ENCOUNTER — Other Ambulatory Visit (INDEPENDENT_AMBULATORY_CARE_PROVIDER_SITE_OTHER): Payer: BC Managed Care – PPO

## 2011-12-15 ENCOUNTER — Other Ambulatory Visit: Payer: Self-pay | Admitting: *Deleted

## 2011-12-15 DIAGNOSIS — Z79899 Other long term (current) drug therapy: Secondary | ICD-10-CM

## 2011-12-15 LAB — CBC WITH DIFFERENTIAL/PLATELET
Basophils Absolute: 0 10*3/uL (ref 0.0–0.1)
HCT: 45.6 % (ref 39.0–52.0)
Lymphs Abs: 1.9 10*3/uL (ref 0.7–4.0)
MCV: 90.7 fl (ref 78.0–100.0)
Monocytes Absolute: 0.6 10*3/uL (ref 0.1–1.0)
Platelets: 286 10*3/uL (ref 150.0–400.0)
RDW: 13.5 % (ref 11.5–14.6)

## 2011-12-15 LAB — COMPREHENSIVE METABOLIC PANEL
ALT: 17 U/L (ref 0–53)
Alkaline Phosphatase: 59 U/L (ref 39–117)
Glucose, Bld: 96 mg/dL (ref 70–99)
Sodium: 138 mEq/L (ref 135–145)
Total Bilirubin: 0.3 mg/dL (ref 0.3–1.2)
Total Protein: 6.6 g/dL (ref 6.0–8.3)

## 2011-12-15 LAB — TESTOSTERONE: Testosterone: 1051.9 ng/dL — ABNORMAL HIGH (ref 350.00–890.00)

## 2011-12-15 LAB — PSA: PSA: 0.92 ng/mL (ref 0.10–4.00)

## 2011-12-15 LAB — LIPID PANEL
HDL: 48.3 mg/dL (ref >39.00)
Triglycerides: 106 mg/dL (ref 0.0–149.0)
VLDL: 21.2 mg/dL (ref 0.0–40.0)

## 2011-12-16 ENCOUNTER — Encounter: Payer: Self-pay | Admitting: Internal Medicine

## 2011-12-20 NOTE — Progress Notes (Signed)
   [facility] Follow-up Outpatient Visit  [name] [name] 1951-01-23  Date: 12/07/11   Subjective: [name] [name] presents today to followup on his medications prescribed for depression. His wife came with him to his appointment today, and reports that he seems to have decompensated. She reports that he is back to where he was before we started treatment. [name] [name] agrees that his depression is severe.his job continues to be a significant stressor and a main reason why his mood is so poor. He continues to want to sleep excessively. He feels that he is ability to focus and concentrate is affected. He continues to experience low energy, lack of interest, anhedonia, and depressed mood. He denies any suicidal or homicidal ideation. He denies any auditory or visual hallucinations.  There were no vitals filed for this visit.  Mental Status Examination  Appearance: Disheveled Alert: Yes Attention: good  Cooperative: Yes Eye Contact: Good Speech: Slow Psychomotor Activity: Psychomotor Retardation Memory/Concentration: Intact Oriented: person, place, time/date and situation Mood: Depressed and Hopeless Affect: Constricted and Depressed Thought Processes and Associations: Coherent and Logical Fund of Knowledge: Fair Thought Content:  Insight: Fair Judgement: Good  Diagnosis: Maj. Depressive disorder, recurrent, severe; ADHD inattentive type.  Treatment Plan: We will continue all of his other meds, and had a dose of Intuniv to target his distractibility, inattention this, and increase his energy.He will followup in one month.  [name] Caudell, PA

## 2011-12-26 NOTE — Progress Notes (Signed)
   [facility] Follow-up Outpatient Visit  [name] [name] 19-Feb-1951  Date: 11/16/11   Subjective: Shrey presents today with his wife for a followup visit for his treatment of major depressive disorder. His wife reports that he has decompensated significantly, and feels that he is back where he was before he began treatment. He continues to endorse a need to sleep all the time except when he is at work. He denies any auditory or visual hallucinations. He denies any suicidal or homicidal ideation.  There were no vitals filed for this visit.  Mental Status Examination  Appearance: Disheveled grooming and casual dress Alert: Yes Attention: good  Cooperative: Yes Eye Contact: Fair Speech: Clear and even Psychomotor Activity: Psychomotor Retardation Memory/Concentration: Fair Oriented: person, place, time/date and situation Mood: Depressed, Hopeless, Irritable and Worthless Affect: Constricted Thought Processes and Associations: Logical Fund of Knowledge: Fair Thought Content: Normal Insight: Fair Judgement: Good  Diagnosis: Maj. depressive disorder, recurrent, severe; ADHD, inattentive type  Treatment Plan: We will add to his medication Vyvanse 50 mg and followup in 3 weeks.  Adalyne Lovick, PA-C

## 2011-12-28 ENCOUNTER — Ambulatory Visit (INDEPENDENT_AMBULATORY_CARE_PROVIDER_SITE_OTHER): Payer: BC Managed Care – PPO | Admitting: Physician Assistant

## 2011-12-28 DIAGNOSIS — F332 Major depressive disorder, recurrent severe without psychotic features: Secondary | ICD-10-CM

## 2011-12-28 DIAGNOSIS — F988 Other specified behavioral and emotional disorders with onset usually occurring in childhood and adolescence: Secondary | ICD-10-CM

## 2011-12-28 MED ORDER — LISDEXAMFETAMINE DIMESYLATE 70 MG PO CAPS: 70.0000 mg | ORAL_CAPSULE | ORAL | Status: DC

## 2012-01-02 NOTE — Progress Notes (Signed)
   [facility] Follow-up Outpatient Visit  [name] [name] [birth year]  Date: 12/28/11   Subjective: [name] [name] presents today to followup on his medications prescribed for depression and ADHD. He reports that the Vyvanse helps to improve his mood and energy, and that he feels more stable. He states that his mood is not quite as up and down. He is more alert and aware while at work. He denies any side effects to the Vyvanse that are bothersome. He brought in with him his laboratory results that were drawn on 12/15/2011. He denies any suicidal or homicidal ideation. He denies any auditory or visual hallucinations.  Objective: His laboratory results from 12/15/2011 show a normal CBC with differential, a normal comprehensive metabolic panel, and a normal PSA, a normal lipid panel, and an elevated testosterone of 1051.90 (reference range 350 -890)  There were no vitals filed for this visit.  Mental Status Examination  Appearance: Disheveled and casual Alert: Yes Attention: good  Cooperative: Yes Eye Contact: Good Speech: Clear and even Psychomotor Activity: Psychomotor Retardation Memory/Concentration: Intact Oriented: person, place, time/date and situation Mood: Depressed Affect: Constricted Thought Processes and Associations: Goal Directed and Logical Fund of Knowledge: Good Thought Content: Normal Insight: Good Judgement: Good  Diagnosis: Maj. depressive disorder, recurrent, severe; attention deficit disorder inattentive type  Treatment Plan: We will continue his Vyvanse at 70 mg daily, decrease his testosterone cypionate injection to 300 mg every 3 weeks. He will present in 3 weeks for his next injection, and we will order the labs to be taken after the injection. We'll also continue his Wellbutrin XL 300 mg daily, Celexa 40 mg daily, L. methyl folate 15 mg daily.  [name] Duce, PA-C

## 2012-01-06 ENCOUNTER — Other Ambulatory Visit (HOSPITAL_COMMUNITY): Payer: Self-pay | Admitting: Physician Assistant

## 2012-01-06 DIAGNOSIS — F332 Major depressive disorder, recurrent severe without psychotic features: Secondary | ICD-10-CM

## 2012-01-18 ENCOUNTER — Ambulatory Visit (INDEPENDENT_AMBULATORY_CARE_PROVIDER_SITE_OTHER): Payer: BC Managed Care – PPO | Admitting: Physician Assistant

## 2012-01-18 DIAGNOSIS — F332 Major depressive disorder, recurrent severe without psychotic features: Secondary | ICD-10-CM

## 2012-01-18 DIAGNOSIS — F988 Other specified behavioral and emotional disorders with onset usually occurring in childhood and adolescence: Secondary | ICD-10-CM

## 2012-01-18 DIAGNOSIS — F909 Attention-deficit hyperactivity disorder, unspecified type: Secondary | ICD-10-CM

## 2012-01-18 DIAGNOSIS — Z79899 Other long term (current) drug therapy: Secondary | ICD-10-CM

## 2012-01-18 MED ORDER — TESTOSTERONE CYPIONATE 200 MG/ML IM SOLN: 300.0000 mg | INTRAMUSCULAR | Status: DC

## 2012-01-18 MED ORDER — LISDEXAMFETAMINE DIMESYLATE 70 MG PO CAPS: 70.0000 mg | ORAL_CAPSULE | ORAL | Status: DC

## 2012-01-20 ENCOUNTER — Encounter: Payer: Self-pay | Admitting: *Deleted

## 2012-01-20 ENCOUNTER — Ambulatory Visit (INDEPENDENT_AMBULATORY_CARE_PROVIDER_SITE_OTHER): Payer: BC Managed Care – PPO | Admitting: Internal Medicine

## 2012-01-20 ENCOUNTER — Encounter: Payer: Self-pay | Admitting: Internal Medicine

## 2012-01-20 ENCOUNTER — Other Ambulatory Visit (INDEPENDENT_AMBULATORY_CARE_PROVIDER_SITE_OTHER): Payer: BC Managed Care – PPO

## 2012-01-20 VITALS — BP 102/72 | HR 61 | Temp 97.5°F | Ht 76.0 in | Wt 273.0 lb

## 2012-01-20 DIAGNOSIS — N453 Epididymo-orchitis: Secondary | ICD-10-CM

## 2012-01-20 DIAGNOSIS — N452 Orchitis: Secondary | ICD-10-CM

## 2012-01-20 DIAGNOSIS — R55 Syncope and collapse: Secondary | ICD-10-CM

## 2012-01-20 LAB — URINALYSIS, ROUTINE W REFLEX MICROSCOPIC
Hgb urine dipstick: NEGATIVE
Ketones, ur: NEGATIVE
Leukocytes, UA: NEGATIVE
Nitrite: NEGATIVE
Urobilinogen, UA: 1 (ref 0.0–1.0)
pH: 6 (ref 5.0–8.0)

## 2012-01-20 MED ORDER — CIPROFLOXACIN HCL 500 MG PO TABS: 500.0000 mg | ORAL_TABLET | Freq: Two times a day (BID) | ORAL | Status: AC

## 2012-01-20 NOTE — Patient Instructions (Signed)
Take all new medications as prescribed Please go to LAB in the Basement for the urine tests to be done today You will be contacted by phone if any changes need to be made immediately.  Otherwise, you will receive a letter about your results with an explanation. You are given the work note today

## 2012-01-21 ENCOUNTER — Encounter: Payer: Self-pay | Admitting: Internal Medicine

## 2012-01-21 NOTE — Progress Notes (Signed)
Subjective:    Patient ID: [name] [name], male    DOB: January 13, 1951, 61 y.o.   MRN: [id number]  HPI  Here with wife;  C/o 2-3 days acute onset left testicle pain/swelling, low grade temp, feeling poorly overall, less appetite, but  Denies urinary symptoms such as dysuria, frequency, urgency,or hematuria.  Similar to episode approx 5 yrs ago tx per urology/  Pt denies chest pain, increased sob or doe, wheezing, orthopnea, PND, increased LE swelling, palpitations, dizziness or syncope.  Pt denies polydipsia, polyuria.   Pt denies new neurological symptoms such as new headache, or facial or extremity weakness or numbness, though unfortunately during the exam today while pt was standing, and as I finished the GU exam specifically very tender left testicle, pt had acute dizziness for a few seconds, then frank syncope.  I managed to grasp the right arm above the wrist and was able to slow the fall but he still sat hard to the buttocks on the floor, then slipped backwards to left occiput to the floor, with head and neck becoming wedged between chair leg and wall.  Chair was lifted and moved immediately and pt become cognizant I estimate in less than 10 seconds asking what had happened.  There was no obvious injury from the fall such as laceration, bruise or swelling, specifically to the left base of the neck most wedged by the chair leg, and the left occiput.  Pt stood up less than 30 seconds after regained consciousness, declined vitals or to lie down, but drank some water, sat to rest in the exam room in a chair for approx 15 min with wife, and was able to ambulate and leave without further complaints.   Past Medical History  Diagnosis Date  . Headache   . Cardiac murmur     as a child  . Streptococcal meningitis     as an infant  . Depression   .  OSA (obstructive sleep apnea) 01/17/2011    npsg 2012:  AHI 67/hr. Auto titration 2012:  Optimal pressure 12cm.   Marland Kitchen HEADACHES, HX OF 02/18/2008    Qualifier:  Diagnosis of  By: Genelle Gather CMA, [place]    . APPENDECTOMY, HX OF 02/18/2008    Qualifier: Diagnosis of  By: Genelle Gather CMA, [place]     Past Surgical History  Procedure Date  . Nasal sinus surgery     x 4 as a child  . Vasectomy   . Appendectomy 1967    reports that he has never smoked. He has never used smokeless tobacco. He reports that he does not drink alcohol or use illicit drugs. family history includes Lung cancer in his father. No Known Allergies Current Outpatient Prescriptions on File Prior to Visit  Medication Sig Dispense Refill  . buPROPion (WELLBUTRIN XL) 300 MG 24 hr tablet TAKE ONE TABLET BY MOUTH ONE TIME DAILY  30 tablet  2  . citalopram (CELEXA) 40 MG tablet TAKE ONE TABLET BY MOUTH ONE TIME DAILY  30 tablet  2  . L-methylfolate Calcium 15 MG TABS TAKE ONE TABLET BY MOUTH ONE TIME DAILY  30 tablet  2  . lisdexamfetamine (VYVANSE) 70 MG capsule Take 1 capsule (70 mg total) by mouth every morning.  30 capsule  0  . testosterone cypionate (DEPOTESTOTERONE CYPIONATE) 200 MG/ML injection Inject 1.5 mLs (300 mg total) into the muscle every 14 (fourteen) days.  10 mL  0   Review of Systems Review of Systems  Constitutional: Negative for diaphoresis and  unexpected weight change.  Gastrointestinal: Negative for vomiting and blood in stool.  Genitourinary: Negative for hematuria and decreased urine volume.  Musculoskeletal: Negative for gait problem.  Skin: Negative for color change and wound.  Neurological: Negative for tremors and numbness.  Psychiatric/Behavioral: Negative for decreased concentration. The patient is not hyperactive.      Objective:   Physical Exam BP 102/72  Pulse 61  Temp(Src) 97.5 F (36.4 C) (Oral)  Ht 6\' 4"  (1.93 m)  Wt 273 lb (123.832 kg)  BMI 33.23 kg/m2  SpO2 95% Physical Exam  VS noted Constitutional: Pt appears well-developed and well-nourished.  HENT: Head: Normocephalic.  Right Ear: External ear normal.  Left Ear: External ear normal.    Eyes: Conjunctivae and EOM are normal. Pupils are equal, round, and reactive to light.  Neck: Normal range of motion. Neck supple.  Cardiovascular: Normal rate and regular rhythm.   Pulmonary/Chest: Effort normal and breath sounds normal.  Abd:  Soft, NT, non-distended, + BS GU: normal male penis without d/c,  Scrotum without erythema or swelling,  Right testicle normal to palpation, but left testicle with mild sweling but severe tender without mass, fluctuance Neurological: Pt is alert. Motor/gait intact after fall Skin: Skin is warm. No erythema.  Psychiatric: Pt behavior is normal. Thought content normal after syncope     Assessment & Plan:

## 2012-01-21 NOTE — Assessment & Plan Note (Signed)
Brief, c/w vasovagal due to pain with GU exam, no overt injury and pt able to leave without complaint, all questions answered

## 2012-01-21 NOTE — Assessment & Plan Note (Addendum)
Moderate at least it seems, for antibx course,  to f/u any worsening symptoms or concerns, for urine studies

## 2012-01-22 LAB — URINE CULTURE
Colony Count: NO GROWTH
Organism ID, Bacteria: NO GROWTH

## 2012-01-23 ENCOUNTER — Encounter: Payer: Self-pay | Admitting: Internal Medicine

## 2012-01-23 NOTE — Progress Notes (Signed)
   [facility] Follow-up Outpatient Visit  [name] [name] 1951-02-26  Date: 01/18/2012   Subjective: [name] [name] presents today to followup on medications prescribed for his depression and low testosterone, as well as ADHD. He reports that with the Vyvanse, he feels better than he has in a long time. He feels that this combination of medications is the best he's been on. He denies any undesirable side effects. He reports that he is staying awake a couple more hours per day, which gives him more time to spend with his wife. He is eating a healthier diet, and reports that instead of a hamburger at work, he will eat a piece of fruit. He still craves sweets and eats a significant amount of ice cream and snack cakes. He denies any suicidal or homicidal ideation. He denies any auditory or visual hallucinations.  There were no vitals filed for this visit.  Mental Status Examination  Appearance: Casual Alert: Yes Attention: good  Cooperative: Yes Eye Contact: Good Speech: Clear and even Psychomotor Activity: Psychomotor Retardation Memory/Concentration: Intact Oriented: person, place, time/date and situation Mood: Depressed Affect: Constricted Thought Processes and Associations: Goal Directed and Logical Fund of Knowledge: Fair Thought Content: Normal Insight: Fair Judgement: Good  Diagnosis: Maj. depressive disorder, recurrent, severe; ADHD inattentive type.  Treatment Plan: We will continue his Marinus Maw XL 300 mg daily, Celexa 40 mg at bedtime, Elavil for folate 15 mg daily, Vyvanse 70 mg daily, and testosterone injection 300 mg every 3 weeks. We will have him get some lab work done in approximately one week, and he will followup in 3 weeks.  Neythan Kozlov, PA-C

## 2012-01-24 ENCOUNTER — Ambulatory Visit (INDEPENDENT_AMBULATORY_CARE_PROVIDER_SITE_OTHER): Payer: BC Managed Care – PPO | Admitting: Internal Medicine

## 2012-01-24 ENCOUNTER — Encounter: Payer: Self-pay | Admitting: Internal Medicine

## 2012-01-24 ENCOUNTER — Other Ambulatory Visit (INDEPENDENT_AMBULATORY_CARE_PROVIDER_SITE_OTHER): Payer: BC Managed Care – PPO

## 2012-01-24 VITALS — BP 100/70 | HR 71 | Temp 98.4°F | Resp 16 | Wt 275.0 lb

## 2012-01-24 DIAGNOSIS — N453 Epididymo-orchitis: Secondary | ICD-10-CM

## 2012-01-24 DIAGNOSIS — R739 Hyperglycemia, unspecified: Secondary | ICD-10-CM

## 2012-01-24 DIAGNOSIS — N451 Epididymitis: Secondary | ICD-10-CM

## 2012-01-24 DIAGNOSIS — E291 Testicular hypofunction: Secondary | ICD-10-CM

## 2012-01-24 DIAGNOSIS — R7309 Other abnormal glucose: Secondary | ICD-10-CM

## 2012-01-24 DIAGNOSIS — R5381 Other malaise: Secondary | ICD-10-CM

## 2012-01-24 DIAGNOSIS — R5383 Other fatigue: Secondary | ICD-10-CM

## 2012-01-24 LAB — CBC WITH DIFFERENTIAL/PLATELET
Eosinophils Relative: 3.3 % (ref 0.0–5.0)
HCT: 49.6 % (ref 39.0–52.0)
Hemoglobin: 16.4 g/dL (ref 13.0–17.0)
Lymphs Abs: 1.9 10*3/uL (ref 0.7–4.0)
Monocytes Relative: 6.3 % (ref 3.0–12.0)
Neutro Abs: 4.2 10*3/uL (ref 1.4–7.7)
RBC: 5.51 Mil/uL (ref 4.22–5.81)
WBC: 6.7 10*3/uL (ref 4.5–10.5)

## 2012-01-24 NOTE — Progress Notes (Signed)
  Subjective:    Patient ID: [name] [name], male    DOB: Jun 15, 1951, 61 y.o.   MRN: [id number]  HPI [name] [name] was seen last week for left testicular pain. Dr. Jonny Ruiz diagnosed orchitis and started him on cipro. He was very tender on exam that date and had a syncopal episode. He reports that he had a similar problem in '10 evaluated by Dr. Aldean Ast - U/S confirmed epididimytis and he responded to NSAIDS. He continues to have a great deal of pain and intermittent swelling of the left testicular area. He has felt cold but no documented fever. His symptoms are very similar to this previous episode.   Past Medical History  Diagnosis Date  . Headache   . Cardiac murmur     as a child  . Streptococcal meningitis     as an infant  . Depression   .  OSA (obstructive sleep apnea) 01/17/2011    npsg 2012:  AHI 67/hr. Auto titration 2012:  Optimal pressure 12cm.   Marland Kitchen HEADACHES, HX OF 02/18/2008    Qualifier: Diagnosis of  By: Genelle Gather CMA, [place]    . APPENDECTOMY, HX OF 02/18/2008    Qualifier: Diagnosis of  By: Genelle Gather CMA, [place]     Past Surgical History  Procedure Date  . Nasal sinus surgery     x 4 as a child  . Vasectomy   . Appendectomy 1967   Family History  Problem Relation Age of Onset  . Lung cancer Father    History   Social History  . Marital Status: Married    Spouse Name: N/A    Number of Children: Y  . Years of Education: N/A   Occupational History  . 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 Smoking status: Never Smoker   . Smokeless tobacco: Never Used  . Alcohol Use: No  . Drug Use: No  . Sexually Active: Not on file   Other Topics Concern  . Not on file   Social History Narrative  . No narrative on file       Review of Systems System review is negative for any constitutional, cardiac, pulmonary, GI or neuro symptoms or complaints other than as described in the HPI.     Objective:   Physical Exam Filed Vitals:   01/24/12 1629  BP: 100/70  Pulse: 71  Temp: 98.4 F (36.9 C)  Resp: 16   Gen'l- haggard appearing white man who is uncomfortable Cor - RRR Pulm -normal respirations. GU- right testicle normal, left testicle feels ok but he is very tender toward to upper pole and the epididymis is very tender. The spermatic chord is normal        Assessment & Plan:

## 2012-01-24 NOTE — Patient Instructions (Signed)
Epididymis - based on history and exam this is epididymitis. Plan - finish the cipro. Start taking ibuprofen 600 mg three or four times a day. Watch for GI side effects.   Epididymitis Epididymitis is a swelling (inflammation) of the epididymis. The epididymis is a cord-like structure along the back part of the testicle. Epididymitis is usually, but not always, caused by infection. This is usually a sudden problem beginning with chills, fever and pain behind the scrotum and in the testicle. There may be swelling and redness of the testicle. DIAGNOSIS   Physical examination will reveal a tender, swollen epididymis. Sometimes, cultures are obtained from the urine or from prostate secretions to help find out if there is an infection or if the cause is a different problem. Sometimes, blood work is performed to see if your white blood cell count is elevated and if a germ (bacterial) or viral infection is present. Using this knowledge, an appropriate medicine which kills germs (antibiotic) can be chosen by your caregiver. A viral infection causing epididymitis will most often go away (resolve) without treatment. HOME CARE INSTRUCTIONS    Hot sitz baths for 20 minutes, 4 times per day, may help relieve pain.   Only take over-the-counter or prescription medicines for pain, discomfort or fever as directed by your caregiver.   Take all medicines, including antibiotics, as directed. Take the antibiotics for the full prescribed length of time even if you are feeling better.   It is very important to keep all follow-up appointments.  SEEK IMMEDIATE MEDICAL CARE IF:    You have a fever.   You have pain not relieved with medicines.   You have any worsening of your problems.   Your pain seems to come and go.   You develop pain, redness, and swelling in the scrotum and surrounding areas.  MAKE SURE YOU:    Understand these instructions.   Will watch your condition.   Will get help right away if you  are not doing well or get worse.  Document Released: 09/16/2000 Document Revised: 09/08/2011 Document Reviewed: 08/06/2009 [facility] Patient Information 2012 [place], [place].

## 2012-01-25 ENCOUNTER — Encounter: Payer: Self-pay | Admitting: Internal Medicine

## 2012-01-25 LAB — COMPREHENSIVE METABOLIC PANEL
ALT: 19 U/L (ref 0–53)
Alkaline Phosphatase: 71 U/L (ref 39–117)
CO2: 30 mEq/L (ref 19–32)
Sodium: 140 mEq/L (ref 135–145)
Total Bilirubin: 0.6 mg/dL (ref 0.3–1.2)
Total Protein: 7.5 g/dL (ref 6.0–8.3)

## 2012-01-25 LAB — TESTOSTERONE, FREE, TOTAL, SHBG: Testosterone-% Free: 2.8 % (ref 1.6–2.9)

## 2012-01-26 DIAGNOSIS — R739 Hyperglycemia, unspecified: Secondary | ICD-10-CM | POA: Insufficient documentation

## 2012-01-26 DIAGNOSIS — N451 Epididymitis: Secondary | ICD-10-CM | POA: Insufficient documentation

## 2012-01-26 NOTE — Assessment & Plan Note (Signed)
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 Mild varicocele.   Plan - complete Cipro prescription           Ibuprofen 600 mg qid - GI precautions           For persistent pain - GU referral.

## 2012-01-26 NOTE — Assessment & Plan Note (Signed)
Non-fasting serum glucose 119  Plan - prudent diet           A1C for confirmation.

## 2012-01-26 NOTE — Assessment & Plan Note (Signed)
Treated at [facility] with frequent monitoring and dose adjustment.  Plan - testosterone level  Addendum: total serum testosterone 713

## 2012-02-06 ENCOUNTER — Other Ambulatory Visit (HOSPITAL_COMMUNITY): Payer: Self-pay | Admitting: Physician Assistant

## 2012-02-08 ENCOUNTER — Ambulatory Visit (INDEPENDENT_AMBULATORY_CARE_PROVIDER_SITE_OTHER): Payer: BC Managed Care – PPO | Admitting: Physician Assistant

## 2012-02-08 DIAGNOSIS — F988 Other specified behavioral and emotional disorders with onset usually occurring in childhood and adolescence: Secondary | ICD-10-CM

## 2012-02-08 DIAGNOSIS — F332 Major depressive disorder, recurrent severe without psychotic features: Secondary | ICD-10-CM

## 2012-02-08 DIAGNOSIS — F909 Attention-deficit hyperactivity disorder, unspecified type: Secondary | ICD-10-CM

## 2012-02-08 MED ORDER — TESTOSTERONE CYPIONATE 200 MG/ML IM SOLN: 300.0000 mg | INTRAMUSCULAR | Status: DC

## 2012-02-08 NOTE — Progress Notes (Signed)
   [name] [name]  [name] [name] [birth year]  Date: 02/08/2012   Subjective: [name] [name] presents today to followup on his medications prescribed for depression and ADHD. He reports that he had a bad night at work last night. He explains that he took his Vyvanse but he did not feel as good as he usually does. He also reports that he suffered a bout of orchitis and was treated with Cipro and ibuprofen. He states that his mood is about the same. He does continue to have more energy and is sleeping less than he had been prior to starting the Vyvanse. He reports that he is continuing to eat a healthier diet, and seeing some benefit from that. She denies any suicidal or homicidal ideation. He denies any auditory or visual hallucinations.  There were no vitals filed for this [name].  Labs: [name] [name] broadband the printed results of his lab work done at his primary care provider. His testosterone level is within range at 713.52, his sex hormone binding free testosterone and free testosterone percentage are also within range. His CMP is normal except for an elevated glucose level of 119, but this was not a fasting test. Also his GFR is slightly low at 55.22. His CBC with differential is completely normal. This report will be scanned into the chart  Mental Status Examination  Appearance: Fairly groomed and casually dressed Alert: Yes Attention: good  Cooperative: Yes Eye Contact: Good Speech: Clear and coherent Psychomotor Activity: Psychomotor Retardation Memory/Concentration: Intact Oriented: person, place, time/date and situation Mood: Depressed Affect: Constricted Thought Processes and Associations: Goal Directed and Linear Fund of Knowledge: Good Thought Content: Normal Insight: Good Judgement: Good  Diagnosis: Maj. depressive disorder, recurrent, severe; ADHD, inattentive type  Treatment Plan: We will continue the Vyvanse at 70 mg daily, and consider an  increase if he is building a tolerance. We will also continue his Wellbutrin XL 300 mg daily and Celexa 40 mg daily, as well as his L. methyl folate calcium 15 mg daily. We will continue to have him come for testosterone injections every 3 weeks at 300 mg per injection.  Kamoni Depree, PA-C

## 2012-02-29 ENCOUNTER — Ambulatory Visit (HOSPITAL_COMMUNITY): Payer: Self-pay | Admitting: Physician Assistant

## 2012-03-01 ENCOUNTER — Ambulatory Visit (INDEPENDENT_AMBULATORY_CARE_PROVIDER_SITE_OTHER): Payer: BC Managed Care – PPO | Admitting: Physician Assistant

## 2012-03-01 DIAGNOSIS — F332 Major depressive disorder, recurrent severe without psychotic features: Secondary | ICD-10-CM

## 2012-03-01 DIAGNOSIS — F988 Other specified behavioral and emotional disorders with onset usually occurring in childhood and adolescence: Secondary | ICD-10-CM

## 2012-03-01 MED ORDER — LISDEXAMFETAMINE DIMESYLATE 70 MG PO CAPS: 70.0000 mg | ORAL_CAPSULE | ORAL | Status: DC

## 2012-03-13 DIAGNOSIS — F988 Other specified behavioral and emotional disorders with onset usually occurring in childhood and adolescence: Secondary | ICD-10-CM | POA: Insufficient documentation

## 2012-03-13 DIAGNOSIS — F332 Major depressive disorder, recurrent severe without psychotic features: Secondary | ICD-10-CM | POA: Insufficient documentation

## 2012-03-13 NOTE — Progress Notes (Signed)
   [facility] Follow-up Outpatient Visit  [name] [name] 15-Nov-1950  Date: 03/01/2012   Subjective: [name] [name] presents today to followup on medications prescribed for depression and ADHD. He reports that he is doing somewhat better, and feels that the Vyvanse is very helpful. He states that this combination of medications is the best days had so far. He normally takes the Vyvanse before going in to work at night, but because he is on vacation for a couple of days, he took the Vyvanse just prior to his appointment today. He has developed a rash on his legs and face, and asked if medication could possibly be causing that. He denies any suicidal or homicidal ideation. He denies any auditory or visual hallucinations.  There were no vitals filed for this visit.  Mental Status Examination  Appearance: Casual Alert: Yes Attention: good  Cooperative: Yes Eye Contact: Good Speech: Clear and coherent Psychomotor Activity: Psychomotor Retardation Memory/Concentration: Intact Oriented: person, place, time/date and situation Mood: Dysphoric Affect: Constricted Thought Processes and Associations: Linear Fund of Knowledge: Good Thought Content: Normal Insight: Fair Judgement: Good  Diagnosis: Maj. depressive disorder, recurrent, severe; ADHD inattentive type  Treatment Plan: We will continue his Wellbutrin XL 300 mg daily, Celexa 40 mg at bedtime, L. methyl folate 15 mg daily, Vyvanse 70 mg daily, and testosterone cypionate 300 mg injection every 3 weeks. He will return for followup in 3 weeks.  Cecilia Vancleve, PA-C

## 2012-03-21 ENCOUNTER — Ambulatory Visit (INDEPENDENT_AMBULATORY_CARE_PROVIDER_SITE_OTHER): Payer: BC Managed Care – PPO | Admitting: Physician Assistant

## 2012-03-21 DIAGNOSIS — F332 Major depressive disorder, recurrent severe without psychotic features: Secondary | ICD-10-CM

## 2012-03-21 DIAGNOSIS — Z79899 Other long term (current) drug therapy: Secondary | ICD-10-CM

## 2012-03-21 MED ORDER — TESTOSTERONE CYPIONATE 200 MG/ML IM SOLN: 300.0000 mg | INTRAMUSCULAR | Status: DC

## 2012-03-21 NOTE — Progress Notes (Signed)
   [facility] Follow-up Outpatient Visit  [name] [name] 1951/04/30  Date: 03/21/2012   Subjective: [name] [name] presents today to followup on his treatment for depression. He reports that his mood is "okay." He complains that he has been unable to maintain an erection for the past week to 10 days. He reports that his erection has lost before he is able to achieve an orgasm. He denies any decrease in libido. He states that his sleep is good, that he comes home from work and goes to sleep about 8 AM and sleeps until about 2 PM, then he'll be up for 2 more hours before lying back down for a couple hours before going to work. He reports when he does not take the Vyvanse he feels like "crap." He reports that his diet is still improved. He denies any suicidal or homicidal ideation. He denies any auditory or visual hallucinations.  There were no vitals filed for this visit.  Mental Status Examination  Appearance: Well groomed and casually dressed Alert: Yes Attention: good  Cooperative: Yes Eye Contact: Good Speech: Clear and coherent Psychomotor Activity: Psychomotor Retardation Memory/Concentration: Intact Oriented: person, place, time/date and situation Mood: Depressed Affect: Constricted Thought Processes and Associations: Circumstantial and Logical Fund of Knowledge: Good Thought Content: Normal Insight: Good Judgement: Good  Diagnosis: Maj. depressive disorder, recurrent, severe  Treatment Plan: We will continue his medications as follows: Wellbutrin XL 300 mg daily, Celexa 40 mg at bedtime, L. methyl folate 15 mg daily, Vyvanse 70 mg daily, and testosterone cypionate 300 mg IM every 3 weeks. We will go ahead and check labs including testosterone level. If labs do not reveal anything significant, he will need referral to a urologist to followup on his inability to maintain an erection. He will return for an injection in 3 weeks, and he will have a followup appointment in 6  weeks.  [name] Rudden, PA-C

## 2012-04-03 ENCOUNTER — Other Ambulatory Visit (HOSPITAL_COMMUNITY): Payer: Self-pay | Admitting: Physician Assistant

## 2012-04-11 ENCOUNTER — Ambulatory Visit (HOSPITAL_COMMUNITY): Payer: Self-pay | Admitting: Physician Assistant

## 2012-04-12 ENCOUNTER — Ambulatory Visit (HOSPITAL_COMMUNITY): Payer: Self-pay | Admitting: Physician Assistant

## 2012-04-16 ENCOUNTER — Other Ambulatory Visit (HOSPITAL_COMMUNITY): Payer: Self-pay | Admitting: Physician Assistant

## 2012-04-16 ENCOUNTER — Ambulatory Visit (INDEPENDENT_AMBULATORY_CARE_PROVIDER_SITE_OTHER): Payer: BC Managed Care – PPO | Admitting: *Deleted

## 2012-04-16 DIAGNOSIS — F988 Other specified behavioral and emotional disorders with onset usually occurring in childhood and adolescence: Secondary | ICD-10-CM

## 2012-04-16 DIAGNOSIS — F332 Major depressive disorder, recurrent severe without psychotic features: Secondary | ICD-10-CM

## 2012-04-16 MED ORDER — LISDEXAMFETAMINE DIMESYLATE 70 MG PO CAPS: 70.0000 mg | ORAL_CAPSULE | ORAL | Status: DC

## 2012-04-16 MED ORDER — TESTOSTERONE CYPIONATE 200 MG/ML IM SOLN: 300.0000 mg | INTRAMUSCULAR | Status: DC

## 2012-05-03 ENCOUNTER — Ambulatory Visit (INDEPENDENT_AMBULATORY_CARE_PROVIDER_SITE_OTHER): Payer: BC Managed Care – PPO | Admitting: Physician Assistant

## 2012-05-03 DIAGNOSIS — F331 Major depressive disorder, recurrent, moderate: Secondary | ICD-10-CM

## 2012-05-03 DIAGNOSIS — F988 Other specified behavioral and emotional disorders with onset usually occurring in childhood and adolescence: Secondary | ICD-10-CM

## 2012-05-03 MED ORDER — TESTOSTERONE CYPIONATE 200 MG/ML IM SOLN: 300.0000 mg | INTRAMUSCULAR | Status: DC

## 2012-05-03 MED ORDER — LISDEXAMFETAMINE DIMESYLATE 70 MG PO CAPS: 70.0000 mg | ORAL_CAPSULE | ORAL | Status: DC

## 2012-05-03 NOTE — Progress Notes (Signed)
Testosterone Cypionate 300mg , 1.5 ml injected into L upper outer quadrant

## 2012-05-08 NOTE — Progress Notes (Addendum)
   [facility] Follow-up Outpatient Visit  [name] [name] [birth year]  Date: 05/03/2012   Subjective: [name] [name] presents today to followup on his treatment for her depression and ADHD. He feels that the Vyvanse has improved his mood and energy, as well as his ability to focus significantly. He does continue to report that he sleeps excessively, but he has improved somewhat. He continues to change his diet so he is eating less junk food and more healthy items. He denies any suicidal or homicidal ideation. He denies any auditory or visual hallucinations.  There were no vitals filed for this visit.  Mental Status Examination  Appearance: Fairly groomed and casually dressed, weight equals 272.8 pounds Alert: Yes Attention: good  Cooperative: Yes Eye Contact: Good Speech: Clear and coherent Psychomotor Activity: Psychomotor Retardation Memory/Concentration: Intact Oriented: person, place, time/date and situation Mood: Depressed Affect: Congruent and Constricted Thought Processes and Associations: Linear and Logical Fund of Knowledge: Good Thought Content: Normal Insight: Good Judgement: Good  Diagnosis: Maj. depressive disorder, recurrent, moderate; ADHD, inattentive type  Treatment Plan: We will continue his Wellbutrin XL 300 mg daily, citalopram 40 mg at bedtime, Detrol and 15 mg daily, Vyvanse 70 mg daily, and testosterone cypionate 300 mg injection every 3 weeks. He will return in 3 weeks for followup.  Constanza Mincy, PA-C

## 2012-05-14 ENCOUNTER — Telehealth (HOSPITAL_COMMUNITY): Payer: Self-pay | Admitting: Physician Assistant

## 2012-05-14 ENCOUNTER — Telehealth (HOSPITAL_COMMUNITY): Payer: Self-pay

## 2012-05-14 NOTE — Telephone Encounter (Signed)
05/14/12 9:06am  Pt's wife called in reference to her husband and son having side effects with medication - celexa 40mg  the side effects are developing breast and problems with ejectlation - the wife sounded upset stating crisis - informed her that she could take them to the Urgent care, Emergency room or bring them here to [facility] for assessment - the wife stated that it was not that type of crisis - she needed to speak with Hessie Diener - informed Mrs. Baez that Hessie Diener is in out in-patient unit this morning will in outpatient this afternoon but I will give Hessie Diener the message - Mrs. Stauch gave her cell#[phone] as well for contact./sh

## 2012-05-14 NOTE — Telephone Encounter (Signed)
Returned call to patient's wife regarding concerns that patient experiencing difficulty with ejaculation. Recommended the patient take half dose of Celexa, and will discuss at next appointment.

## 2012-05-24 ENCOUNTER — Ambulatory Visit (INDEPENDENT_AMBULATORY_CARE_PROVIDER_SITE_OTHER): Payer: BC Managed Care – PPO | Admitting: *Deleted

## 2012-05-24 DIAGNOSIS — F339 Major depressive disorder, recurrent, unspecified: Secondary | ICD-10-CM

## 2012-05-24 MED ORDER — TESTOSTERONE CYPIONATE 200 MG/ML IM SOLN
300.0000 mg | INTRAMUSCULAR | Status: DC
  Administered 2012-05-24: 300 mg via INTRAMUSCULAR

## 2012-05-24 MED ORDER — TESTOSTERONE CYPIONATE 200 MG/ML IM SOLN: 300.0000 mg | INTRAMUSCULAR | Status: DC

## 2012-05-31 ENCOUNTER — Ambulatory Visit (HOSPITAL_COMMUNITY): Payer: Self-pay | Admitting: Physician Assistant

## 2012-06-04 ENCOUNTER — Other Ambulatory Visit (HOSPITAL_COMMUNITY): Payer: Self-pay | Admitting: Physician Assistant

## 2012-06-14 ENCOUNTER — Ambulatory Visit (HOSPITAL_COMMUNITY): Payer: BC Managed Care – PPO | Admitting: *Deleted

## 2012-06-14 ENCOUNTER — Ambulatory Visit (INDEPENDENT_AMBULATORY_CARE_PROVIDER_SITE_OTHER): Payer: BC Managed Care – PPO | Admitting: Physician Assistant

## 2012-06-14 DIAGNOSIS — F332 Major depressive disorder, recurrent severe without psychotic features: Secondary | ICD-10-CM

## 2012-06-14 DIAGNOSIS — F988 Other specified behavioral and emotional disorders with onset usually occurring in childhood and adolescence: Secondary | ICD-10-CM

## 2012-06-14 MED ORDER — LISDEXAMFETAMINE DIMESYLATE 50 MG PO CAPS: 100.0000 mg | ORAL_CAPSULE | ORAL | Status: DC

## 2012-06-14 MED ORDER — ESCITALOPRAM OXALATE 20 MG PO TABS: ORAL_TABLET | ORAL | Status: DC

## 2012-06-14 MED ORDER — TESTOSTERONE CYPIONATE 200 MG/ML IM SOLN
300.0000 mg | INTRAMUSCULAR | Status: DC
  Administered 2012-06-14: 300 mg via INTRAMUSCULAR

## 2012-06-14 NOTE — Addendum Note (Signed)
Addended by: Tonny Bollman on: 06/14/2012 05:06 PM   Modules accepted: Orders

## 2012-06-14 NOTE — Progress Notes (Signed)
   [facility] Behavior[facility] Follow-up Outpatient Visit  JULIANA WHELAN [birth year]  Date: 06/14/2012   Subjective: [name] [name] presents today to followup on his treatment for depression. He reports that his depression seems to have worsened recently. He reports that nothing has changed, but he is more depressed. He wonders if his Vyvanse dose could be increased. Approximately one month ago we decreased his dose of Celexa from 40 mg to 20 mg do to some complaints of inability to maintain an erection. His erectile dysfunction has resolved. He has recently had 2 days off from work and has to go back tomorrow. He has no suicidal or homicidal ideation. He has no auditory or visual hallucinations.  There were no vitals filed for this visit.  Mental Status Examination  Appearance: Disheveled and casually dressed Alert: Yes Attention: good  Cooperative: Yes Eye Contact: Good Speech: Slow and decreased volume Psychomotor Activity: Psychomotor Retardation Memory/Concentration: Impaired memory/concentration intact Oriented: person, place, time/date and situation Mood: Depressed Affect: Constricted Thought Processes and Associations: Logical Fund of Knowledge: Fair Thought Content: Normal Insight: Fair Judgement: Good  Diagnosis: Maj. depressive disorder recurrent severe, ADHD inattentive type  Treatment Plan: We will change his Celexa to Lexapro 20 mg daily, and increase his Vyvanse to 100 mg daily. We will continue his testosterone injections as prescribed, and he has been asked again to obtain laboratory studies including his testosterone level. He will return for followup in 3 weeks.  Zaylia Riolo, PA-C

## 2012-06-21 ENCOUNTER — Ambulatory Visit (HOSPITAL_COMMUNITY): Payer: Self-pay | Admitting: Physician Assistant

## 2012-07-04 ENCOUNTER — Other Ambulatory Visit (HOSPITAL_COMMUNITY): Payer: Self-pay | Admitting: Physician Assistant

## 2012-07-04 NOTE — Telephone Encounter (Signed)
RX request for Celexa.Contacted pt.Pt asleep(works nights)Spoke to wife.Per progress note/plan 06/14/12:Pt was to stop Celexa and begin Lexapro.Wife states Lexapro RX was filled and is in the home. She will have pt call when he wakes.

## 2012-07-05 ENCOUNTER — Ambulatory Visit (INDEPENDENT_AMBULATORY_CARE_PROVIDER_SITE_OTHER): Payer: BC Managed Care – PPO | Admitting: *Deleted

## 2012-07-05 DIAGNOSIS — F339 Major depressive disorder, recurrent, unspecified: Secondary | ICD-10-CM

## 2012-07-05 DIAGNOSIS — F332 Major depressive disorder, recurrent severe without psychotic features: Secondary | ICD-10-CM

## 2012-07-05 MED ORDER — TESTOSTERONE CYPIONATE 200 MG/ML IM SOLN: 300.0000 mg | INTRAMUSCULAR | Status: DC

## 2012-07-05 MED ORDER — TESTOSTERONE CYPIONATE 200 MG/ML IM SOLN
300.0000 mg | INTRAMUSCULAR | Status: DC
  Administered 2012-07-05: 300 mg via INTRAMUSCULAR

## 2012-07-05 NOTE — Telephone Encounter (Signed)
Pt in office for injection, states he has enough Lexapro. Has not started it yet.Was waiting to finish entire prescription of Celexa

## 2012-07-26 ENCOUNTER — Ambulatory Visit (INDEPENDENT_AMBULATORY_CARE_PROVIDER_SITE_OTHER): Payer: BC Managed Care – PPO | Admitting: Physician Assistant

## 2012-07-26 DIAGNOSIS — E291 Testicular hypofunction: Secondary | ICD-10-CM

## 2012-07-26 DIAGNOSIS — F332 Major depressive disorder, recurrent severe without psychotic features: Secondary | ICD-10-CM

## 2012-07-26 DIAGNOSIS — F339 Major depressive disorder, recurrent, unspecified: Secondary | ICD-10-CM

## 2012-07-26 DIAGNOSIS — F331 Major depressive disorder, recurrent, moderate: Secondary | ICD-10-CM

## 2012-07-26 DIAGNOSIS — F988 Other specified behavioral and emotional disorders with onset usually occurring in childhood and adolescence: Secondary | ICD-10-CM

## 2012-07-26 MED ORDER — ESCITALOPRAM OXALATE 20 MG PO TABS: 20.0000 mg | ORAL_TABLET | Freq: Every day | ORAL | Status: DC

## 2012-07-26 MED ORDER — TESTOSTERONE CYPIONATE 200 MG/ML IM SOLN
300.0000 mg | INTRAMUSCULAR | Status: DC
  Administered 2012-07-26: 300 mg via INTRAMUSCULAR

## 2012-07-26 MED ORDER — TESTOSTERONE CYPIONATE 200 MG/ML IM SOLN: 300.0000 mg | INTRAMUSCULAR | Status: DC

## 2012-07-26 MED ORDER — BUPROPION HCL ER (XL) 300 MG PO TB24: 300.0000 mg | ORAL_TABLET | Freq: Every day | ORAL | Status: DC

## 2012-07-26 MED ORDER — L-METHYLFOLATE CALCIUM 15 MG PO TABS: 15.0000 mg | ORAL_TABLET | Freq: Every day | ORAL | Status: DC

## 2012-07-26 MED ORDER — LISDEXAMFETAMINE DIMESYLATE 70 MG PO CAPS: 70.0000 mg | ORAL_CAPSULE | ORAL | Status: DC

## 2012-07-26 NOTE — Progress Notes (Signed)
   [facility] Behavior[facility] Follow-up Outpatient Visit  [name] [name] 1950-11-04  Date: 07/26/2012   Subjective: Haygen presents today to followup on his treatment for depression. He talks a lot about his son, Romeo Apple, who will graduate with honors in May. He reports that the increased dose of Vyvanse made no difference in his mood, and he is having to pay extra, so he would like to decrease the dose back to 70 mg. He reports that his sleep and appetite are good. He denies any suicidal or homicidal ideation. He denies any auditory or visual hallucinations. He has not yet gotten the lab work done that was ordered in June.  There were no vitals filed for this visit.  Mental Status Examination  Appearance: Disheveled Alert: Yes Attention: good  Cooperative: Yes Eye Contact: Good Speech: Clear and coherent Psychomotor Activity: Psychomotor Retardation Memory/Concentration: Intact Oriented: person, place, time/date and situation Mood: Depressed Affect: Constricted Thought Processes and Associations: Linear Fund of Knowledge: Good Thought Content: Normal Insight: Fair Judgement: Fair  Diagnosis: Maj. depressive disorder, recurrent, moderate; ADHD, inattentive type  Treatment Plan: We will decrease his Vyvanse to 70 mg daily, continue his Wellbutrin XL 300 mg daily, Lexapro 20 mg daily, Deplin 15 mg daily, and testosterone cypionate IM injection 300 mg every 3 weeks. He is to get his lab work performed in approximately one week, and he will return for followup in 3 weeks.  Daniele Dillow, PA-C

## 2012-08-03 ENCOUNTER — Other Ambulatory Visit (HOSPITAL_COMMUNITY): Payer: Self-pay | Admitting: Physician Assistant

## 2012-08-06 NOTE — Progress Notes (Signed)
Patient ID: [name] [name], male   DOB: Nov 15, 1950, 61 y.o.   MRN: [id number] No further not required

## 2012-08-10 ENCOUNTER — Other Ambulatory Visit (HOSPITAL_COMMUNITY): Payer: Self-pay | Admitting: Physician Assistant

## 2012-08-16 ENCOUNTER — Other Ambulatory Visit (INDEPENDENT_AMBULATORY_CARE_PROVIDER_SITE_OTHER): Payer: BC Managed Care – PPO

## 2012-08-16 ENCOUNTER — Ambulatory Visit (INDEPENDENT_AMBULATORY_CARE_PROVIDER_SITE_OTHER): Payer: BC Managed Care – PPO | Admitting: Physician Assistant

## 2012-08-16 ENCOUNTER — Telehealth: Payer: Self-pay | Admitting: Internal Medicine

## 2012-08-16 DIAGNOSIS — E291 Testicular hypofunction: Secondary | ICD-10-CM

## 2012-08-16 DIAGNOSIS — F332 Major depressive disorder, recurrent severe without psychotic features: Secondary | ICD-10-CM

## 2012-08-16 DIAGNOSIS — N453 Epididymo-orchitis: Secondary | ICD-10-CM

## 2012-08-16 DIAGNOSIS — R7309 Other abnormal glucose: Secondary | ICD-10-CM

## 2012-08-16 DIAGNOSIS — F331 Major depressive disorder, recurrent, moderate: Secondary | ICD-10-CM

## 2012-08-16 DIAGNOSIS — F988 Other specified behavioral and emotional disorders with onset usually occurring in childhood and adolescence: Secondary | ICD-10-CM

## 2012-08-16 DIAGNOSIS — Z0000 Encounter for general adult medical examination without abnormal findings: Secondary | ICD-10-CM

## 2012-08-16 DIAGNOSIS — R739 Hyperglycemia, unspecified: Secondary | ICD-10-CM

## 2012-08-16 DIAGNOSIS — E785 Hyperlipidemia, unspecified: Secondary | ICD-10-CM

## 2012-08-16 LAB — CBC WITH DIFFERENTIAL/PLATELET
Basophils Absolute: 0 10*3/uL (ref 0.0–0.1)
Eosinophils Absolute: 0.3 10*3/uL (ref 0.0–0.7)
HCT: 45.3 % (ref 39.0–52.0)
Hemoglobin: 15.1 g/dL (ref 13.0–17.0)
Lymphocytes Relative: 33.7 % (ref 12.0–46.0)
Lymphs Abs: 2.1 10*3/uL (ref 0.7–4.0)
MCHC: 33.3 g/dL (ref 30.0–36.0)
Neutro Abs: 3.3 10*3/uL (ref 1.4–7.7)
RDW: 13.3 % (ref 11.5–14.6)

## 2012-08-16 LAB — COMPREHENSIVE METABOLIC PANEL
ALT: 22 U/L (ref 0–53)
AST: 20 U/L (ref 0–37)
Albumin: 3.9 g/dL (ref 3.5–5.2)
Alkaline Phosphatase: 77 U/L (ref 39–117)
BUN: 14 mg/dL (ref 6–23)
Calcium: 8.7 mg/dL (ref 8.4–10.5)
Chloride: 105 mEq/L (ref 96–112)
Potassium: 4.3 mEq/L (ref 3.5–5.1)
Sodium: 140 mEq/L (ref 135–145)

## 2012-08-16 LAB — HEMOGLOBIN A1C: Hgb A1c MFr Bld: 6.1 % (ref 4.6–6.5)

## 2012-08-16 LAB — TESTOSTERONE: Testosterone: 102.28 ng/dL — ABNORMAL LOW (ref 300–890)

## 2012-08-16 MED ORDER — TESTOSTERONE CYPIONATE 200 MG/ML IM SOLN: 300.0000 mg | INTRAMUSCULAR | Status: DC

## 2012-08-16 MED ORDER — TESTOSTERONE CYPIONATE 200 MG/ML IM SOLN
300.0000 mg | INTRAMUSCULAR | Status: DC
  Administered 2012-08-16: 300 mg via INTRAMUSCULAR

## 2012-08-16 NOTE — Telephone Encounter (Signed)
Patient is on his way to the office for blood work that was ordered by [employer] [facility] and per his wife we will need to authorize this blood work before he can have it done, per wife we do this for the patient every few months

## 2012-08-16 NOTE — Progress Notes (Signed)
   [facility] Follow-up Outpatient Visit  [name] [name] November 13, 1950  Date: 08/16/2012   Subjective: [name] [name] presents today to followup on his treatment for depression and ADHD. He reports that his mood has been "okay." He reports he has been sleeping more, but he has been working 12 hour days 5-6 days per week for several months now. He states his appetite is good. He feels the Vyvanse does not give him to lift at work that he feels he needs. He denies any suicidal or homicidal ideation. He denies any auditory or visual hallucinations.  There were no vitals filed for this visit.  Mental Status Examination  Appearance: Disheveled Alert: Yes Attention: good  Cooperative: Yes Eye Contact: Good Speech: Clear and coherent Psychomotor Activity: Psychomotor Retardation Memory/Concentration: Intact Oriented: person, place, time/date and situation Mood: Depressed Affect: Constricted Thought Processes and Associations: Linear Fund of Knowledge: Good Thought Content: Normal Insight: Fair Judgement: Fair  Diagnosis: Maj. depressive disorder, recurrent, moderate; ADHD, inattentive type  Treatment Plan: We will continue his Vyvanse 70 mg daily, Wellbutrin XL 300 mg daily, Lexapro 20 mg daily, L. methyl folate 15 mg daily, and testosterone cypionate 300 mg every 3 weeks. He will return in 3 weeks for followup.  Eusebia Grulke, PA-C

## 2012-08-16 NOTE — Telephone Encounter (Signed)
Lab called and requested orders. Orders done.

## 2012-08-17 ENCOUNTER — Telehealth: Payer: Self-pay | Admitting: *Deleted

## 2012-08-17 LAB — TESTOSTERONE, % FREE: Testosterone-% Free: 2.2 % (ref 1.6–2.9)

## 2012-08-17 NOTE — Telephone Encounter (Signed)
Left vm with pt to return call

## 2012-08-20 ENCOUNTER — Telehealth: Payer: Self-pay | Admitting: *Deleted

## 2012-08-20 NOTE — Telephone Encounter (Signed)
Called and spoke with pt's wife. Told her [name] [name] testosterone is low. He knows he was previously being treated at outside clinic. Dr. Debby Bud would be happy to see in office to discuss treatment. [name] [name] state he is being treated at behavioral health for his low testosterone.

## 2012-09-02 ENCOUNTER — Other Ambulatory Visit (HOSPITAL_COMMUNITY): Payer: Self-pay | Admitting: Physician Assistant

## 2012-09-03 MED ORDER — CITALOPRAM HYDROBROMIDE 40 MG PO TABS: 20.0000 mg | ORAL_TABLET | Freq: Every day | ORAL | Status: DC

## 2012-09-03 NOTE — Telephone Encounter (Signed)
Received EPIC message that RX sent this am for Celexa not received by pharmacy. Resent RX @1227 

## 2012-09-03 NOTE — Addendum Note (Signed)
Addended by: Tonny Bollman on: 09/03/2012 12:28 PM   Modules accepted: Orders

## 2012-09-06 ENCOUNTER — Ambulatory Visit (INDEPENDENT_AMBULATORY_CARE_PROVIDER_SITE_OTHER): Payer: BC Managed Care – PPO | Admitting: Physician Assistant

## 2012-09-06 DIAGNOSIS — F339 Major depressive disorder, recurrent, unspecified: Secondary | ICD-10-CM

## 2012-09-06 DIAGNOSIS — F332 Major depressive disorder, recurrent severe without psychotic features: Secondary | ICD-10-CM

## 2012-09-06 DIAGNOSIS — F988 Other specified behavioral and emotional disorders with onset usually occurring in childhood and adolescence: Secondary | ICD-10-CM

## 2012-09-06 MED ORDER — TESTOSTERONE CYPIONATE 200 MG/ML IM SOLN: 400.0000 mg | INTRAMUSCULAR | Status: DC

## 2012-09-06 NOTE — Progress Notes (Signed)
[facility] Progress Note  BRET VANESSEN [id number] 61 y.o.  09/06/2012 4:08 PM  Chief Complaint: Depression and ADHD  History of Present Illness: [name] [name] presents today to followup on his treatment for depression and ADHD. He reports that he has been more depressed, and expresses a desire to isolate. He denies any suicidal or homicidal ideation. He denies any auditory or visual hallucinations. He also reports he is been experiencing some mild anxiety, and feels like people are putting him down at work. He has been working a lot, although he does not like his job. He feels he needs to work a lot for financial reasons. His wife does not help with her finances, as she has back problems. She has not tried to get disability, and [name] [name] seems hesitant to go down that path.  Suicidal Ideation: No Plan Formed: NA Patient has means to carry out plan: NA  Homicidal Ideation: No Plan Formed: NA Patient has means to carry out plan: NA  Review of Systems: Psychiatric: Agitation: No Hallucination: No Depressed Mood: Yes Insomnia: No Hypersomnia: Yes Altered Concentration: Yes Feels Worthless: Yes Grandiose Ideas: No Belief In Special Powers: No New/Increased Substance Abuse: No Compulsions: No  Neurologic: Headache: No Seizure: No Paresthesias: No    Outpatient Encounter Prescriptions as of 09/06/2012  Medication Sig Dispense Refill  . buPROPion (WELLBUTRIN XL) 300 MG 24 hr tablet Take 1 tablet (300 mg total) by mouth daily.  90 tablet  0  . buPROPion (WELLBUTRIN XL) 300 MG 24 hr tablet TAKE ONE TABLET BY MOUTH ONE TIME DAILY  90 tablet  0  . citalopram (CELEXA) 40 MG tablet Take 0.5 tablets (20 mg total) by mouth daily.  30 tablet  2  . L-methylfolate Calcium 15 MG TABS Take 15 mg by mouth daily.  90 tablet  0  . lisdexamfetamine (VYVANSE) 70 MG capsule Take 1 capsule (70 mg total) by mouth every morning.  30 capsule  0  . lisdexamfetamine (VYVANSE) 70 MG capsule Take 1  capsule (70 mg total) by mouth every morning.  30 capsule  0  . lisdexamfetamine (VYVANSE) 70 MG capsule Take 1 capsule (70 mg total) by mouth every morning.  30 capsule  0  . testosterone cypionate (DEPOTESTOTERONE CYPIONATE) 200 MG/ML injection Inject 2 mLs (400 mg total) into the muscle every 21 ( twenty-one) days.  10 mL  1  . [DISCONTINUED] testosterone cypionate (DEPOTESTOTERONE CYPIONATE) 200 MG/ML injection Inject 1.5 mLs (300 mg total) into the muscle every 21 ( twenty-one) days.  10 mL  1     Physical Exam: Constitutional:  There were no vitals taken for this visit.  General Appearance: alert, oriented, no acute distress, well nourished, obese and Fairly groomed and casually dressed  Musculoskeletal: Strength & Muscle Tone: within normal limits Gait & Station: normal Patient leans: N/A  Psychiatric: Speech (describe rate, volume, coherence, spontaneity, and abnormalities if any): Slow rate with normal volume, clear and coherent  Thought Process (describe rate, content, abstract reasoning, and computation): Normal rate and content, as well as reasoning and computation  Associations: Relevant  Thoughts: normal  Mental Status: Orientation: oriented to person, place, time/date and situation Mood & Affect: depressed affect Attention Span & Concentration: Normal  Medical Decision Making (Choose Three): Established Problem, Worsening (2), Review or order medicine tests (1) and Review of New Medication or Change in Dosage (2)  Assessment: Axis I: Maj. depressive disorder, recurrent, severe; ADHD, inattentive type  Axis II: Deferred  Axis III: Obesity,  low testosterone, hyper cholesterolemia, hypertriglyceridemia  Axis IV: Work stresses  Axis V: 55   Plan: We will increase his testosterone to 400 mg every 3 weeks, and repeat labs prior to his return in 3 weeks. We'll continue his Wellbutrin XL 300 mg daily, Celexa 40 mg at bedtime, Deplin 15 mg daily, Vyvanse 70 mg  daily. If lab results do not show improvement in testosterone levels, we will consider dosing the testosterone cypionate IM more frequently, or consider a change to transdermal testosterone supplementation.  Saron Tweed, PA-C 09/06/2012

## 2012-09-24 ENCOUNTER — Ambulatory Visit (INDEPENDENT_AMBULATORY_CARE_PROVIDER_SITE_OTHER): Payer: BC Managed Care – PPO | Admitting: *Deleted

## 2012-09-24 DIAGNOSIS — F332 Major depressive disorder, recurrent severe without psychotic features: Secondary | ICD-10-CM

## 2012-09-24 MED ORDER — TESTOSTERONE CYPIONATE 200 MG/ML IM SOLN
400.0000 mg | INTRAMUSCULAR | Status: DC
  Administered 2012-09-24: 400 mg via INTRAMUSCULAR

## 2012-09-27 ENCOUNTER — Ambulatory Visit (HOSPITAL_COMMUNITY): Payer: Self-pay | Admitting: Physician Assistant

## 2012-10-18 ENCOUNTER — Ambulatory Visit (INDEPENDENT_AMBULATORY_CARE_PROVIDER_SITE_OTHER): Payer: BC Managed Care – PPO | Admitting: Physician Assistant

## 2012-10-18 DIAGNOSIS — F988 Other specified behavioral and emotional disorders with onset usually occurring in childhood and adolescence: Secondary | ICD-10-CM

## 2012-10-18 DIAGNOSIS — F332 Major depressive disorder, recurrent severe without psychotic features: Secondary | ICD-10-CM

## 2012-10-18 MED ORDER — LISDEXAMFETAMINE DIMESYLATE 70 MG PO CAPS: 70.0000 mg | ORAL_CAPSULE | ORAL | Status: DC

## 2012-10-24 ENCOUNTER — Encounter (HOSPITAL_COMMUNITY): Payer: Self-pay | Admitting: *Deleted

## 2012-10-24 NOTE — Progress Notes (Signed)
Authorization received  for Vyvanse 70 mg from [facility], effective 11/15/12 to 11/15/13

## 2012-10-26 ENCOUNTER — Telehealth (HOSPITAL_COMMUNITY): Payer: Self-pay

## 2012-11-05 NOTE — Progress Notes (Signed)
   [facility] Behavioral Health Follow-up Outpatient Visit  [name] [name] 1950-10-07  Date:  10/18/2012  Subjective: [name] [name] presents today to followup on his treatment for depression and ADHD. When asked how he was doing he states "so-so." He reports there's been no improvement in his mood. We have discussed changing his testosterone to a transdermal version for better consistency and delivery, but he states he's not interested in changing. He endorses some anxiety and dread when he has to return to work after having a period of time off. He complains that he is unable to maintain an erection. He reports that he bought a new car and they have gotten a 25-year-old poor collie mix dog. He denies any suicidal or homicidal ideation. He denies any auditory or visual hallucinations.  There were no vitals filed for this visit.  Mental Status Examination  Appearance: Disheveled Alert: Yes Attention: good  Cooperative: Yes Eye Contact: Good Speech: Clear and coherent Psychomotor Activity: Psychomotor Retardation Memory/Concentration: Intact Oriented: person, place, time/date and situation Mood: Depressed Affect: Constricted Thought Processes and Associations: Linear Fund of Knowledge: Fair Thought Content: Normal Insight: Fair Judgement: Good  Diagnosis: Maj. depressive disorder, recurrent, severe; ADHD, inattentive type  Treatment Plan: He has been directed to talk to his primary care provider about his erectile dysfunction. We will get some lab work to check his testosterone level. Continue his Wellbutrin XL 300 mg daily, Celexa 40 mg at bedtime, Vyvanse 70 mg daily, and testosterone cypionate 400 mg every 3 weeks. He will return for followup in 3 weeks.  Clinton Dragone, PA-C

## 2012-11-08 ENCOUNTER — Ambulatory Visit (INDEPENDENT_AMBULATORY_CARE_PROVIDER_SITE_OTHER): Payer: BC Managed Care – PPO | Admitting: Physician Assistant

## 2012-11-08 DIAGNOSIS — F332 Major depressive disorder, recurrent severe without psychotic features: Secondary | ICD-10-CM

## 2012-11-08 DIAGNOSIS — F339 Major depressive disorder, recurrent, unspecified: Secondary | ICD-10-CM

## 2012-11-08 DIAGNOSIS — F988 Other specified behavioral and emotional disorders with onset usually occurring in childhood and adolescence: Secondary | ICD-10-CM

## 2012-11-08 MED ORDER — CITALOPRAM HYDROBROMIDE 20 MG PO TABS: 20.0000 mg | ORAL_TABLET | Freq: Every day | ORAL | Status: DC

## 2012-11-08 MED ORDER — TESTOSTERONE CYPIONATE 200 MG/ML IM SOLN: 400.0000 mg | INTRAMUSCULAR | Status: DC

## 2012-11-08 MED ORDER — LISDEXAMFETAMINE DIMESYLATE 70 MG PO CAPS: 70.0000 mg | ORAL_CAPSULE | ORAL | Status: DC

## 2012-11-08 MED ORDER — TESTOSTERONE CYPIONATE 200 MG/ML IM SOLN
400.0000 mg | INTRAMUSCULAR | Status: DC
  Administered 2012-11-08: 400 mg via INTRAMUSCULAR

## 2012-11-08 MED ORDER — BUPROPION HCL ER (XL) 300 MG PO TB24: 300.0000 mg | ORAL_TABLET | ORAL | Status: DC

## 2012-11-08 MED ORDER — L-METHYLFOLATE CALCIUM 15 MG PO TABS: 15.0000 mg | ORAL_TABLET | Freq: Every day | ORAL | Status: DC

## 2012-11-09 NOTE — Progress Notes (Signed)
   [facility] Follow-up Outpatient Visit  EILAM SHREWSBURY 07/03/1951  Date: 11/08/2012   Subjective: [name] [name] presents today to follow up on his treatment for depression and ADHD. He states that his mood is "okay." He continues to be concerned about his inability to maintain an erection. He has not made an appointment to see his primary care physician, Dr. Debby Bud. He has not gotten any testosterone labs drawn. He continues to complain of poor energy. He brought an energy drink in 4 this provider to look at the ingredients to see if any of them might be a causative factor in his ED. The product contained no caffeine, and was made up mostly of different fruit juices and vitamins.  There were no vitals filed for this visit.  Mental Status Examination  Appearance: Casual Alert: Yes Attention: good  Cooperative: Yes Eye Contact: Good Speech: Clear and coherent Psychomotor Activity: Psychomotor Retardation Memory/Concentration: Intact Oriented: person, place, time/date and situation Mood: Depressed Affect: Constricted Thought Processes and Associations: Linear Fund of Knowledge: Fair Thought Content: Normal Insight: Fair Judgement: Fair  Diagnosis: Maj. depressive disorder, recurrent, severe; ADHD, inattentive type  Treatment Plan: We will continue his Wellbutrin XL 300 mg daily, Celexa 40 mg at bedtime, Vyvanse 70 mg daily, and testosterone cypionate 400 mg every 3 weeks. We will obtain testosterone levels. He will return for followup in 3 weeks.  Denisha Hoel, PA-C

## 2012-11-29 ENCOUNTER — Telehealth: Payer: Self-pay | Admitting: *Deleted

## 2012-11-29 ENCOUNTER — Ambulatory Visit (INDEPENDENT_AMBULATORY_CARE_PROVIDER_SITE_OTHER): Payer: BC Managed Care – PPO | Admitting: Physician Assistant

## 2012-11-29 MED ORDER — LISDEXAMFETAMINE DIMESYLATE 70 MG PO CAPS: 70.0000 mg | ORAL_CAPSULE | ORAL | Status: DC

## 2012-11-29 NOTE — Telephone Encounter (Signed)
[name] Sims, PA at [facility] [phone]) called stating he saw pt today. While there he told Mr [name] [name] that for 1 month he has been having chest pain that are substernal of nature with tightening of chest and short of breath while at work. Mr [name] [name] requested that Dr Debby Bud see pt and do a cardiac work up of pt. Please advise.

## 2012-11-29 NOTE — Telephone Encounter (Signed)
Tried calling home number - no ans. Left msg - will try in the AM

## 2012-11-29 NOTE — Progress Notes (Signed)
   [facility] Behavioral Health Follow-up Outpatient Visit  [name] [name] [birth year]  Date: 11/29/2012   Subjective: [name] [name] presents today to followup on his treatment for depression and ADHD. He reports that he has been doing well recently. He had to go to [place] to help his daughter move, and got snowed in for approximately 10 days. He reports that his sleep is good, and he is actually sleeping about one to 2 hours less than he had been, which is an improvement. He states his energy is okay. His appetite is good, but his diet is "so-so." He also describes his mood as "so-so."    [name] [name] offhandedly states "have I told you I have been having chest pain at work?" He reports that he has substernal chest pain that is "tightening" in nature, that can be debilitating, and is sometimes associated with shortness of breath, that lasts for up to one hour. He reports that the pain relieves when he increases at his activity. He also reports that if he takes a deep breath, squats down, or leans forward the pain decreases.  I discussed with this patient that he needs to have a cardiac workup.   There were no vitals filed for this visit.  Mental Status Examination  Appearance:  Casual Alert: Yes Attention: good  Cooperative: Yes Eye Contact: Good Speech:  Clear and coherent Psychomotor Activity: Normal Memory/Concentration:  Intact Oriented: person, place, time/date and situation Mood: Dysphoric Affect: Constricted Thought Processes and Associations: Linear Fund of Knowledge: Good Thought Content: Normal  Insight: Fair Judgement: Fair  Diagnosis:  Maj. depressive disorder, recurrent, moderate; ADHD, inattentive type  Treatment Plan:  We will continue his Wellbutrin XL 300 mg daily, Celexa 20 mg at bedtime, L. methyl folate 15 mg daily, Vyvanse 70 mg daily, and testosterone cypionate 400 mg every 3 weeks. He is instructed to contact his primary care provider, Dr. Debby Bud, to arrange a cardiac study.  This provider called Dr. Debby Bud' office to initiate that communication. He also has not gotten his laboratory work done and he was told that he needs to get that checked, as well as have his cardiac workup, before I can continue his testosterone further  Jailine Lieder, PA-C

## 2012-11-30 ENCOUNTER — Telehealth: Payer: Self-pay | Admitting: *Deleted

## 2012-11-30 NOTE — Telephone Encounter (Signed)
THANK YOU

## 2012-11-30 NOTE — Telephone Encounter (Signed)
haven't had a chance to check with [name] [name] today. Please try to contact him this afternoon.   Thanks

## 2012-11-30 NOTE — Telephone Encounter (Signed)
Called pt and spoke with him about his sx. Pt states that it comes and goes. The chest pain and tightening happens mainly while at work. It comes on him when he is standing in one position for a long period of time. Pt works at the [employer] at night. Pt states he can stand in that one area for hours. When he moves around is eases. Pt feels he is ok to wait for appt. He has made an appt for Thurs, March 6th at 9:30 am. Advised pt that if the sx worsen or become more often he needs to call 911. Pt understood.

## 2012-12-04 ENCOUNTER — Encounter (HOSPITAL_COMMUNITY): Payer: Self-pay | Admitting: *Deleted

## 2012-12-05 ENCOUNTER — Encounter (HOSPITAL_COMMUNITY): Payer: Self-pay | Admitting: *Deleted

## 2012-12-05 MED ORDER — TESTOSTERONE CYPIONATE 200 MG/ML IM SOLN
400.0000 mg | INTRAMUSCULAR | Status: DC
  Administered 2012-12-05: 400 mg via INTRAMUSCULAR

## 2012-12-05 NOTE — Progress Notes (Signed)
Late entry charted:  Injection: 400 mg (2ml) of  Testosterone Cypionate 200 mg/ ml given 11/29/12, LUOQ

## 2012-12-05 NOTE — Telephone Encounter (Signed)
Encounter opened in error

## 2012-12-06 ENCOUNTER — Encounter: Payer: Self-pay | Admitting: Internal Medicine

## 2012-12-06 ENCOUNTER — Ambulatory Visit (INDEPENDENT_AMBULATORY_CARE_PROVIDER_SITE_OTHER): Payer: BC Managed Care – PPO | Admitting: Internal Medicine

## 2012-12-06 ENCOUNTER — Other Ambulatory Visit: Payer: BC Managed Care – PPO

## 2012-12-06 ENCOUNTER — Telehealth (HOSPITAL_COMMUNITY): Payer: Self-pay | Admitting: *Deleted

## 2012-12-06 VITALS — BP 124/68 | HR 64 | Temp 97.3°F | Resp 20 | Wt 275.0 lb

## 2012-12-06 DIAGNOSIS — F339 Major depressive disorder, recurrent, unspecified: Secondary | ICD-10-CM

## 2012-12-06 DIAGNOSIS — Z1211 Encounter for screening for malignant neoplasm of colon: Secondary | ICD-10-CM

## 2012-12-06 DIAGNOSIS — E291 Testicular hypofunction: Secondary | ICD-10-CM

## 2012-12-06 DIAGNOSIS — R079 Chest pain, unspecified: Secondary | ICD-10-CM

## 2012-12-06 DIAGNOSIS — R0789 Other chest pain: Secondary | ICD-10-CM

## 2012-12-06 MED ORDER — SILDENAFIL CITRATE 50 MG PO TABS: 50.0000 mg | ORAL_TABLET | Freq: Every day | ORAL | Status: DC | PRN

## 2012-12-06 NOTE — Telephone Encounter (Signed)
Labs reordered as requested by [facility]: Status of Future

## 2012-12-06 NOTE — Progress Notes (Signed)
Subjective:     Patient ID: [name] [name], male   DOB: 07-19-1951, 62 y.o.   MRN: [id number]  HPI Pt is a 62 yo man with a hx of depression and ADHD presents with a 1-2 month hx of substernal chest pain of a sharp wrenching nature that occurs daily at work.  Onset is gradual over approximately 20 mins, it does not radiate he denies sweating, and feeling of doom.  He denies dyspepsia and sour taste in his mouth.  At its worst he rates the pain a 9/10 and is associated with shortness of breath (pt denies hyperventilation with pain) and a light headedness which he describes as his head floating.  The pain lasts for hours, he has not tried any medications to alleviate the pain but states that it is somewhat relieved with increased activity, bending, and squatting.  When questioned about increased stressors in his life pt reports a 6 month hx of difficulty maintaining an erection and an inability to complete intercourse.  Current Outpatient Prescriptions on File Prior to Visit  Medication Sig Dispense Refill  . buPROPion (WELLBUTRIN XL) 300 MG 24 hr tablet Take 1 tablet (300 mg total) by mouth every morning.  90 tablet  1  . citalopram (CELEXA) 20 MG tablet Take 1 tablet (20 mg total) by mouth daily.  90 tablet  1  . L-methylfolate Calcium 15 MG TABS Take 15 mg by mouth daily.  90 tablet  1  . lisdexamfetamine (VYVANSE) 70 MG capsule Take 1 capsule (70 mg total) by mouth every morning.  30 capsule  0  . testosterone cypionate (DEPOTESTOTERONE CYPIONATE) 200 MG/ML injection Inject 2 mLs (400 mg total) into the muscle every 21 ( twenty-one) days.  10 mL  1  . lisdexamfetamine (VYVANSE) 70 MG capsule Take 1 capsule (70 mg total) by mouth every morning.  30 capsule  0  . lisdexamfetamine (VYVANSE) 70 MG capsule Take 1 capsule (70 mg total) by mouth every morning.  30 capsule  0   No current facility-administered medications on file prior to visit.     Past Medical History  Diagnosis Date  . Headache    . Cardiac murmur     as a child  . Streptococcal meningitis     as an infant  . Depression   .  OSA (obstructive sleep apnea) 01/17/2011    npsg 2012:  AHI 67/hr. Auto titration 2012:  Optimal pressure 12cm.   Marland Kitchen HEADACHES, HX OF 02/18/2008    Qualifier: Diagnosis of  By: Genelle Gather CMA, [place]    . APPENDECTOMY, HX OF 02/18/2008    Qualifier: Diagnosis of  By: Genelle Gather CMA, [place]     Past Surgical History  Procedure Laterality Date  . Nasal sinus surgery      x 4 as a child  . Vasectomy    . Appendectomy  1967   Family History  Problem Relation Age of Onset  . Lung cancer Father    History   Social History  . Marital Status: Married    Spouse Name: N/A    Number of Children: Y  . Years of Education: N/A   Occupational History  . 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 Smoking status: Never Smoker   . Smokeless tobacco: Never Used  . Alcohol Use: No  . Drug Use: No  . Sexually Active: Not on file   Other Topics Concern  . Not on  file   Social History Narrative  . No narrative on file    Review of Systems  Constitutional: Negative for activity change and appetite change.  HENT: Negative for sore throat and trouble swallowing.   Respiratory: Positive for chest tightness and shortness of breath (occurs with the chest pain). Negative for cough.   Cardiovascular: Positive for chest pain.  Gastrointestinal: Negative for nausea, vomiting, diarrhea and constipation.  Genitourinary:       Difficulty maintaining erection   Neurological: Positive for light-headedness. Negative for numbness.  Psychiatric/Behavioral: Positive for dysphoric mood (pt reports this has improved). Negative for sleep disturbance. The patient is nervous/anxious (with regards to erectile dysfunction).        Objective:   Physical Exam Filed Vitals:   12/06/12 0942  BP: 124/68  Pulse: 64  Temp: 97.3 F (36.3 C)  Resp: 20   General: well nourished, well  developed male, sitting comfortably in chair in NAD HENT: Head - [place]/AT, Eyes - EOMI, PERRLA, Throat - MMM, oropharynx clear and no-erythematous CV: RRR, II/VI systolic murmur heard best at upper right sternal border, normal S1/S2 no S3 or S4 appreciated, 2+ radial pulses bilaterally Pulm: CTA bilaterally, no wheezes or crackles Abd: bowel sounds present in all four quadrants, soft, non-tender, non-distended, old appendectomy scar in RLQ Skin: skin of hands is very dry skin with multiple cracks with eschars Neuro: motor function grossly intact, walks unassisted with normal gait Psych: affect is constricted  EKG: 3/6 Rate - 65, Rhythm - sinus, PR - 168, QT - 392, QTcH - 400, normal EKG         Assessment/Plan:        1. Chest pain - atypical for heart related pain and is more suggestive of GI related/acid related pain. EKG is normal. Risk profile is low to moderate: male gender, overweight.   Plan Treat possible GI origin of pain: take otc Ranitidine 150 mg twice a day on a regular basis. For a change in the nature of the pain - pain with exertion, pain being a crushing, squeezing pressure like pain, radiation to arm or jaw, sweats - CALL   2. Erectile dysfunction - new onset: check testosterone level; check with Mr. Watt about drug side effects; trial of Viagra 50 mg as needed.  3. Health Maintenance - pt has never had a screening colonoscopy and would like a referral to GI medicine today to arrange for one.

## 2012-12-06 NOTE — Patient Instructions (Addendum)
Chest pain - atypical for heart related pain and is more suggestive of GI related/acid related pain. EKG is normal. Risk profile is low to moderate: male gender, overweight.   Plan Treat possible GI origin of pain: take otc Ranitidine 150 mg twice a day on a regular basis  For a change in the nature of the pain - pain with exertion, pain being a crushing, squeezing pressure like pain, radiation to arm or jaw, sweats - CALL  Erectile dysfunction - new onset: check testosterone level; check with Mr. Watt about drug side effects; trial of Viagra 50 mg as needed.

## 2012-12-08 NOTE — Assessment & Plan Note (Signed)
Erectile dysfunction - new onset: check testosterone level; check with Mr. Watt about drug side effects; trial of Viagra 50 mg as needed.

## 2012-12-08 NOTE — Assessment & Plan Note (Signed)
Chest pain - atypical for heart related pain and is more suggestive of GI related/acid related pain. EKG is normal. Risk profile is low to moderate: male gender, overweight.  Plan Treat possible GI origin of pain: take otc Ranitidine 150 mg twice a day on a regular basis. For a change in the nature of the pain - pain with exertion, pain being a crushing, squeezing pressure like pain, radiation to arm or jaw, sweats - CALL

## 2012-12-09 LAB — SEX HORMONE BINDING GLOBULIN: Sex Hormone Binding: 19 nmol/L (ref 13–71)

## 2012-12-09 LAB — TESTOSTERONE, % FREE: Testosterone-% Free: 2.8 % (ref 1.6–2.9)

## 2012-12-12 ENCOUNTER — Telehealth: Payer: Self-pay | Admitting: Internal Medicine

## 2012-12-12 NOTE — Telephone Encounter (Signed)
Pt needs the equipment for his c-pap machine.  Dr. Shelle Iron originally set this up with [facility].

## 2012-12-12 NOTE — Telephone Encounter (Signed)
Any reason why he did not make this request to Dr. Shelle Iron? Last saw [name] [name] in 2012 - might want to have follow up.    Need more specific information about what equipment he needs. Also - [facility] - need their number.

## 2012-12-13 NOTE — Telephone Encounter (Signed)
Wife informed.  She will talk to her husband to see if he want to make a follow up with Dr. Shelle Iron.

## 2012-12-18 ENCOUNTER — Ambulatory Visit (INDEPENDENT_AMBULATORY_CARE_PROVIDER_SITE_OTHER): Payer: BC Managed Care – PPO | Admitting: *Deleted

## 2012-12-18 MED ORDER — TESTOSTERONE CYPIONATE 200 MG/ML IM SOLN
400.0000 mg | INTRAMUSCULAR | Status: DC
  Administered 2012-12-18: 400 mg via INTRAMUSCULAR

## 2012-12-18 NOTE — Progress Notes (Signed)
Today's injection tolerated well. No problems noted by patient from previous injections

## 2013-01-08 ENCOUNTER — Ambulatory Visit (INDEPENDENT_AMBULATORY_CARE_PROVIDER_SITE_OTHER): Payer: BC Managed Care – PPO | Admitting: Physician Assistant

## 2013-01-08 DIAGNOSIS — F331 Major depressive disorder, recurrent, moderate: Secondary | ICD-10-CM

## 2013-01-08 DIAGNOSIS — F988 Other specified behavioral and emotional disorders with onset usually occurring in childhood and adolescence: Secondary | ICD-10-CM

## 2013-01-08 DIAGNOSIS — F332 Major depressive disorder, recurrent severe without psychotic features: Secondary | ICD-10-CM

## 2013-01-08 MED ORDER — LISDEXAMFETAMINE DIMESYLATE 70 MG PO CAPS: 70.0000 mg | ORAL_CAPSULE | ORAL | Status: DC

## 2013-01-08 MED ORDER — TESTOSTERONE CYPIONATE 200 MG/ML IM SOLN
400.0000 mg | INTRAMUSCULAR | Status: DC
  Administered 2013-01-08: 400 mg via INTRAMUSCULAR

## 2013-01-08 NOTE — Addendum Note (Signed)
Addended by: Tonny Bollman on: 01/08/2013 03:35 PM   Modules accepted: Orders

## 2013-01-08 NOTE — Progress Notes (Signed)
   [facility] Follow-up Outpatient Visit  [name] [name] 12-Jul-1951  Date: 01/08/2013   Subjective: [name] presents today to followup on his treatment for depression and ADHD. He reports that he is "tired." He states that now as this time that he is normally sleeping. He left work, went home and got some sleep, and got up to come to his appointment, and plans to go back home to sleep some more. He did have his chest pain checked out by his primary care provider as I requested, and the report shows that it is a suspected GI problem, and he was told to take ranitidine. He reports that his mood is about the same. He denies any suicidal or homicidal ideation. He denies any auditory or visual hallucinations. His lab work shows normal testosterone levels.  There were no vitals filed for this visit.  Mental Status Examination  Appearance: Disheveled Alert: No Attention: good  Cooperative: Yes Eye Contact: Fair Speech: Clear and coherent Psychomotor Activity: Psychomotor Retardation Memory/Concentration: Fair Oriented: person, place, time/date and situation Mood: Depressed Affect: Blunt Thought Processes and Associations: Logical Fund of Knowledge: Fair Thought Content: Normal Insight: Fair Judgement: Fair  Diagnosis: Maj. depressive disorder, recurrent, moderate; ADHD, inattentive type  Treatment Plan: We will continue his Wellbutrin XL 300 mg daily, Celexa 20 mg at bedtime, L. methyl folate 15 mg daily, Vyvanse 70 mg daily, and testosterone cypionate 400 mg every 3 weeks. He will return for followup in 6 weeks. He will return for his testosterone injections every 3 weeks.   Ruthanne Mcneish, PA-C

## 2013-01-29 ENCOUNTER — Ambulatory Visit (INDEPENDENT_AMBULATORY_CARE_PROVIDER_SITE_OTHER): Payer: BC Managed Care – PPO | Admitting: *Deleted

## 2013-01-29 DIAGNOSIS — F332 Major depressive disorder, recurrent severe without psychotic features: Secondary | ICD-10-CM

## 2013-01-29 MED ORDER — TESTOSTERONE CYPIONATE 200 MG/ML IM SOLN
400.0000 mg | INTRAMUSCULAR | Status: DC
  Administered 2013-01-29: 400 mg via INTRAMUSCULAR

## 2013-01-29 NOTE — Progress Notes (Signed)
While giving injection LUOQ, noted what appeared to be a flat round inset with legs at patient's waistband area on left side.Insect appeared to be a tick.. Asked pt if he had been outside recently, he affirmed he was doing yard work in recent days. Unable to dislodge insect wiping with alcohol wipe. Instructed pt when he got home to have spouse use tweezers and gently pull on insect to remove it, then wash area with soap and water. Report any redness or swelling at the site to primary care MD. Informed A.Watt, PA.

## 2013-02-20 ENCOUNTER — Ambulatory Visit (INDEPENDENT_AMBULATORY_CARE_PROVIDER_SITE_OTHER): Payer: BC Managed Care – PPO | Admitting: Physician Assistant

## 2013-02-20 VITALS — BP 127/86 | HR 68 | Ht 76.0 in | Wt 265.0 lb

## 2013-02-20 DIAGNOSIS — F332 Major depressive disorder, recurrent severe without psychotic features: Secondary | ICD-10-CM

## 2013-02-20 DIAGNOSIS — F988 Other specified behavioral and emotional disorders with onset usually occurring in childhood and adolescence: Secondary | ICD-10-CM

## 2013-02-20 MED ORDER — LISDEXAMFETAMINE DIMESYLATE 70 MG PO CAPS: 70.0000 mg | ORAL_CAPSULE | ORAL | Status: DC

## 2013-02-20 MED ORDER — TESTOSTERONE CYPIONATE 200 MG/ML IM SOLN
400.0000 mg | INTRAMUSCULAR | Status: DC
  Administered 2013-02-20: 400 mg via INTRAMUSCULAR

## 2013-02-20 MED ORDER — CITALOPRAM HYDROBROMIDE 40 MG PO TABS: 40.0000 mg | ORAL_TABLET | Freq: Every day | ORAL | Status: DC

## 2013-02-25 ENCOUNTER — Encounter (HOSPITAL_COMMUNITY): Payer: Self-pay | Admitting: Physician Assistant

## 2013-02-25 NOTE — Progress Notes (Signed)
[facility] [id number] Progress Note  [name] [name] [id number] 62 y.o.  02/20/2013 3:21 PM  Chief Complaint: followup visit for depression and ADHD  History of Present Illness: [name] [name] presents today to followup on his treatment for depression and ADHD. He endorses a significant amount of increased anxiety, especially at work. He reports that he experiences chest pain during these moments, but denies any shortness of breath or lightheadedness. He reports this occurs only at work, and he experiences a sense of dread associated with having to go to work. At this provider's insistence, [name] [name] saw his primary care provider to rule out any cardiac etiology of this chest pain. His primary care provider feels that the pain is associated with gastrointestinal abnormalities, possibly esophageal spasms. [name] [name] insists that the chest pain is due to anxiety.  He denies that there has been any change in his level of depression. He denies any suicidal or homicidal ideation. He denies any auditory or visual hallucinations. He reports that his sleep and appetite are good.  Suicidal Ideation: No Plan Formed: No Patient has means to carry out plan: No  Homicidal Ideation: No Plan Formed: No Patient has means to carry out plan: No  Review of Systems: Psychiatric: Agitation: No Hallucination: No Depressed Mood: Yes Insomnia: No Hypersomnia: No Altered Concentration: No Feels Worthless: No Grandiose Ideas: No Belief In Special Powers: No New/Increased Substance Abuse: No Compulsions: No  Neurologic: Headache: No Seizure: No Paresthesias: No  Past Medical History: obstructive sleep apnea,  Family History: father, lung cancer  Social: married, one son, employed by the [employer]  Outpatient Encounter Prescriptions as of 02/20/2013  Medication Sig Dispense Refill  . buPROPion (WELLBUTRIN XL) 300 MG 24 hr tablet Take 1 tablet (300 mg total) by mouth every morning.  90  tablet  1  . citalopram (CELEXA) 40 MG tablet Take 1 tablet (40 mg total) by mouth daily.  90 tablet  0  . L-methylfolate Calcium 15 MG TABS Take 15 mg by mouth daily.  90 tablet  1  . lisdexamfetamine (VYVANSE) 70 MG capsule Take 1 capsule (70 mg total) by mouth every morning.  30 capsule  0  . lisdexamfetamine (VYVANSE) 70 MG capsule Take 1 capsule (70 mg total) by mouth every morning.  30 capsule  0  . lisdexamfetamine (VYVANSE) 70 MG capsule Take 1 capsule (70 mg total) by mouth every morning.  30 capsule  0  . sildenafil (VIAGRA) 50 MG tablet Take 1 tablet (50 mg total) by mouth daily as needed for erectile dysfunction.  4 tablet  3  . testosterone cypionate (DEPOTESTOTERONE CYPIONATE) 200 MG/ML injection Inject 2 mLs (400 mg total) into the muscle every 21 ( twenty-one) days.  10 mL  1  . [DISCONTINUED] citalopram (CELEXA) 20 MG tablet Take 1 tablet (20 mg total) by mouth daily.  90 tablet  1  . [DISCONTINUED] lisdexamfetamine (VYVANSE) 70 MG capsule Take 1 capsule (70 mg total) by mouth every morning.  30 capsule  0  . [DISCONTINUED] lisdexamfetamine (VYVANSE) 70 MG capsule Take 1 capsule (70 mg total) by mouth every morning.  30 capsule  0   Facility-Administered Encounter Medications as of 02/20/2013  Medication Dose Route Frequency Provider Last Rate Last Dose  . testosterone cypionate (DEPOTESTOTERONE CYPIONATE) injection 400 mg  400 mg Intramuscular Q21 days Jorje Guild, PA-C   400 mg at 02/20/13 1603    Past Psychiatric History/Hospitalization(s): Anxiety: Yes Bipolar Disorder: No Depression: Yes Mania: No Psychosis: No  Schizophrenia: No Personality Disorder: No Hospitalization for psychiatric illness: No History of Electroconvulsive Shock Therapy: No Prior Suicide Attempts: No  Physical Exam: Constitutional:  BP 127/86  Pulse 68  Ht 6\' 4"  (1.93 m)  Wt 265 lb (120.203 kg)  BMI 32.27 kg/m2  General Appearance: alert, oriented, no acute distress, obese and casually  groomed and dressed  Musculoskeletal: Strength & Muscle Tone: within normal limits Gait & Station: normal Patient leans: N/A  Psychiatric: Speech (describe rate, volume, coherence, spontaneity, and abnormalities if any): Clear and coherent with a regular rate and rhythm, and normal volume  Thought Process (describe rate, content, abstract reasoning, and computation): within normal limits  Associations: Intact  Thoughts: normal  Mental Status: Orientation: oriented to person, place, time/date and situation Mood & Affect: depressed affect Attention Span & Concentration: intact  Medical Decision Making (Choose Three): Established Problem, Stable/Improving (1), Review of Psycho-Social Stressors (1), Review and summation of old records (2) and Review of New Medication or Change in Dosage (2)  Assessment: Axis I: major depressive disorder, recurrent, severe; ADHD  Axis II: deferred  Axis III: obstructive sleep apnea  Axis IV: moderate  Axis V: 60   Plan: we will increase his Celexa back to 40 mg at bedtime. We will continue his Wellbutrin XL 300 mg daily, Treacy Holcomb folate 15 mg daily, Vyvanse 70 mg daily, and testosterone cypionate 400 mg injection every 3 weeks. He will return for an office visit at his next testosterone injection appointment in 3 weeks.  Amara Justen, PA-C 02/25/2013

## 2013-02-27 ENCOUNTER — Ambulatory Visit (HOSPITAL_COMMUNITY): Payer: Self-pay | Admitting: Physician Assistant

## 2013-03-13 ENCOUNTER — Ambulatory Visit (HOSPITAL_COMMUNITY): Payer: BC Managed Care – PPO | Admitting: *Deleted

## 2013-03-14 ENCOUNTER — Ambulatory Visit (INDEPENDENT_AMBULATORY_CARE_PROVIDER_SITE_OTHER): Payer: BC Managed Care – PPO | Admitting: *Deleted

## 2013-03-14 DIAGNOSIS — F332 Major depressive disorder, recurrent severe without psychotic features: Secondary | ICD-10-CM

## 2013-03-14 MED ORDER — TESTOSTERONE CYPIONATE 200 MG/ML IM SOLN
400.0000 mg | INTRAMUSCULAR | Status: DC
  Administered 2013-03-14: 400 mg via INTRAMUSCULAR

## 2013-04-03 ENCOUNTER — Ambulatory Visit (INDEPENDENT_AMBULATORY_CARE_PROVIDER_SITE_OTHER): Payer: BC Managed Care – PPO | Admitting: Physician Assistant

## 2013-04-03 VITALS — BP 117/71 | HR 68 | Ht 76.0 in | Wt 260.6 lb

## 2013-04-03 DIAGNOSIS — F988 Other specified behavioral and emotional disorders with onset usually occurring in childhood and adolescence: Secondary | ICD-10-CM

## 2013-04-03 DIAGNOSIS — F332 Major depressive disorder, recurrent severe without psychotic features: Secondary | ICD-10-CM

## 2013-04-03 MED ORDER — TESTOSTERONE CYPIONATE 200 MG/ML IM SOLN
400.0000 mg | INTRAMUSCULAR | Status: DC
  Administered 2013-04-03: 400 mg via INTRAMUSCULAR

## 2013-04-08 ENCOUNTER — Encounter (HOSPITAL_COMMUNITY): Payer: Self-pay | Admitting: Physician Assistant

## 2013-04-08 NOTE — Progress Notes (Signed)
   [facility] Behavioral Health Follow-up Outpatient Visit  REMY VOILES 03-09-1951  Date: 04/03/2013   Subjective: [name] [name] presents today to followup on his treatment for depression and ADHD. He states that he is doing "all right." He is experiencing less anxiety than he was at his last appointment, and feels that the increased dose of Celexa is helping. He reports that his depression is "okay." He endorses sleeping excessive hours, but states that he needs that much sleep. He is currently working few hours at work because there is less male during the summer. He reports his hours will increase again in August. He states that he is walking the dog 30 minutes one to 2 times daily. He endorses a good appetite and he is trying to eat less sugar. He denies any suicidal or homicidal ideation. He denies any auditory or visual hallucinations.  Filed Vitals:   04/03/13 1600  BP: 117/71  Pulse: 68    Mental Status Examination  Appearance: Casual Alert: Yes Attention: good  Cooperative: Yes Eye Contact: Good Speech: Clear and coherent Psychomotor Activity: Psychomotor Retardation Memory/Concentration: Intact Oriented: person, place, time/date and situation Mood: Depressed Affect: Constricted Thought Processes and Associations: Linear Fund of Knowledge: Good Thought Content: Normal Insight: Good Judgement: Good  Diagnosis: Maj. depressive disorder, recurrent, severe; ADHD, inattentive type  Treatment Plan: We will continue his Celexa 40 mg at bedtime, Wellbutrin XL 300 mg daily, methyl folate 15 mg daily, Vyvanse 70 mg daily, and testosterone cypionate 400 mg injection every 3 weeks. He will return for an office visit in 9 weeks.  Omare Bilotta, PA-C

## 2013-04-24 ENCOUNTER — Other Ambulatory Visit (HOSPITAL_COMMUNITY): Payer: Self-pay | Admitting: Physician Assistant

## 2013-04-24 ENCOUNTER — Ambulatory Visit (INDEPENDENT_AMBULATORY_CARE_PROVIDER_SITE_OTHER): Payer: BC Managed Care – PPO | Admitting: *Deleted

## 2013-04-24 VITALS — BP 128/88 | HR 78 | Ht 76.0 in | Wt 263.4 lb

## 2013-04-24 DIAGNOSIS — F332 Major depressive disorder, recurrent severe without psychotic features: Secondary | ICD-10-CM

## 2013-04-24 DIAGNOSIS — F988 Other specified behavioral and emotional disorders with onset usually occurring in childhood and adolescence: Secondary | ICD-10-CM

## 2013-04-24 MED ORDER — LISDEXAMFETAMINE DIMESYLATE 70 MG PO CAPS: 70.0000 mg | ORAL_CAPSULE | ORAL | Status: DC

## 2013-04-24 MED ORDER — TESTOSTERONE CYPIONATE 200 MG/ML IM SOLN
400.0000 mg | INTRAMUSCULAR | Status: DC
  Administered 2013-04-24: 400 mg via INTRAMUSCULAR

## 2013-05-09 ENCOUNTER — Telehealth (HOSPITAL_COMMUNITY): Payer: Self-pay | Admitting: Physician Assistant

## 2013-05-09 NOTE — Telephone Encounter (Signed)
Called patient to inform him that this provider supervising physician does not want this provider to prescribe or administer testosterone, and that he needed to contact his primary care physician, Dr. Debby Bud, to arrange continuation of that medication. Patient understands and agrees.

## 2013-05-10 ENCOUNTER — Telehealth: Payer: Self-pay | Admitting: *Deleted

## 2013-05-10 DIAGNOSIS — F339 Major depressive disorder, recurrent, unspecified: Secondary | ICD-10-CM

## 2013-05-10 MED ORDER — TESTOSTERONE CYPIONATE 200 MG/ML IM SOLN: 400.0000 mg | INTRAMUSCULAR | Status: DC

## 2013-05-10 NOTE — Telephone Encounter (Signed)
Pt called states he has been getting Testosterone injections at [facility] but they are discontinuing that treatment.  Pt requests Dr Debby Bud to order the administration of the injections here at [facility].

## 2013-05-10 NOTE — Telephone Encounter (Signed)
Spoke with pt, advised Rx ready for pick up 

## 2013-05-10 NOTE — Telephone Encounter (Signed)
Rx done. He is to pick up and schedule nurse visit q21 days for injection.

## 2013-05-13 ENCOUNTER — Other Ambulatory Visit (HOSPITAL_COMMUNITY): Payer: Self-pay | Admitting: Physician Assistant

## 2013-05-15 ENCOUNTER — Ambulatory Visit (HOSPITAL_COMMUNITY): Payer: Self-pay | Admitting: *Deleted

## 2013-06-05 ENCOUNTER — Ambulatory Visit (INDEPENDENT_AMBULATORY_CARE_PROVIDER_SITE_OTHER): Payer: BC Managed Care – PPO | Admitting: Physician Assistant

## 2013-06-05 ENCOUNTER — Encounter (HOSPITAL_COMMUNITY): Payer: Self-pay | Admitting: Physician Assistant

## 2013-06-05 VITALS — BP 122/80 | HR 60 | Ht 76.0 in | Wt 257.0 lb

## 2013-06-05 DIAGNOSIS — F332 Major depressive disorder, recurrent severe without psychotic features: Secondary | ICD-10-CM

## 2013-06-05 MED ORDER — VORTIOXETINE HBR 10 MG PO TABS: 10.0000 mg | ORAL_TABLET | Freq: Every day | ORAL | Status: DC

## 2013-06-05 NOTE — Progress Notes (Signed)
[facility] [id number] Progress Note  [name] [name] [id number] 62 y.o.  06/05/2013 4:01 PM  Chief Complaint:  Increasing depression  History of Present Illness: [name] [name] presents today to followup on his treatment for depression and ADHD.  He reports that he has been getting increasingly depressed. He reports he stopped taking the Vyvanse about a month ago because he felt that it was not helping. He has contacted Dr. Debby Bud about continuing his testosterone, and Dr. Debby Bud has agreed to administer it. He reports that he is sleeping excessively. His appetite is okay, but he is craving sweets. He reports that he is having trouble while driving keeping his car between the lines. He denies any suicidal or homicidal ideation. He denies any auditory or visual hallucinations.  Suicidal Ideation: No Plan Formed: No Patient has means to carry out plan: No  Homicidal Ideation: No Plan Formed: No Patient has means to carry out plan: No  Review of Systems: Psychiatric: Agitation: No Hallucination: No Depressed Mood: Yes Insomnia: No Hypersomnia: Yes Altered Concentration: No Feels Worthless: No Grandiose Ideas: No Belief In Special Powers: No New/Increased Substance Abuse: No Compulsions: No  Neurologic: Headache: No Seizure: No Paresthesias: No  Past Medical History: Hypogonadism  Outpatient Encounter Prescriptions as of 06/05/2013  Medication Sig Dispense Refill  . buPROPion (WELLBUTRIN XL) 300 MG 24 hr tablet Take one tablet by mouth every morning  90 tablet  0  . citalopram (CELEXA) 40 MG tablet Take 1 tablet (40 mg total) by mouth daily.  90 tablet  0  . L-Methylfolate 15 MG TABS Take one tablet by mouth one time daily  90 tablet  0  . lisdexamfetamine (VYVANSE) 70 MG capsule Take 1 capsule (70 mg total) by mouth every morning.  30 capsule  0  . lisdexamfetamine (VYVANSE) 70 MG capsule Take 1 capsule (70 mg total) by mouth every morning.  30 capsule  0  . lisdexamfetamine  (VYVANSE) 70 MG capsule Take 1 capsule (70 mg total) by mouth every morning.  30 capsule  0  . sildenafil (VIAGRA) 50 MG tablet Take 1 tablet (50 mg total) by mouth daily as needed for erectile dysfunction.  4 tablet  3  . Vortioxetine HBr (BRINTELLIX) 10 MG TABS Take 10 mg by mouth daily.  30 tablet  0  . [DISCONTINUED] testosterone cypionate (DEPOTESTOTERONE CYPIONATE) 200 MG/ML injection Inject 2 mLs (400 mg total) into the muscle every 21 ( twenty-one) days.  10 mL  1   No facility-administered encounter medications on file as of 06/05/2013.    Past Psychiatric History/Hospitalization(s): Anxiety: No Bipolar Disorder: No Depression: Yes Mania: No Psychosis: No Schizophrenia: No Personality Disorder: No Hospitalization for psychiatric illness: No History of Electroconvulsive Shock Therapy: No Prior Suicide Attempts: No  Physical Exam: Constitutional:  BP 122/80  Pulse 60  Ht 6\' 4"  (1.93 m)  Wt 257 lb (116.574 kg)  BMI 31.3 kg/m2  General Appearance: alert, oriented, no acute distress, well nourished and casually dressed  Musculoskeletal: Strength & Muscle Tone: decreased Gait & Station: normal Patient leans: N/A  Psychiatric: Speech (describe rate, volume, coherence, spontaneity, and abnormalities if any): Clear and coherent at a rate rate and rhythm and normal volume  Thought Process (describe rate, content, abstract reasoning, and computation):  Within normal limits  Associations: Intact  Thoughts: normal  Mental Status: Orientation: oriented to person, place, time/date and situation Mood & Affect: depressed affect Attention Span & Concentration: Intact  Medical Decision Making (Choose Three): Review of Psycho-Social  Stressors (1), Established Problem, Worsening (2) and Review of New Medication or Change in Dosage (2)  Assessment: Axis I: Maj. depressive disorder, recurrent, severe  Axis II: Deferred  Axis III: Hypogonadism  Axis IV: Moderate  Axis V:  50   Plan: We will initiate brain calyx at 10 mg daily, then begin to taper the Celexa. We'll continue his Wellbutrin XL 300 mg daily, and Devlin 15 mg daily. We will discontinue the Vyvanse as he stopped taking it. He will return for followup in 4 weeks.  [name] Doolen, PA-C 06/05/2013

## 2013-06-06 ENCOUNTER — Ambulatory Visit (INDEPENDENT_AMBULATORY_CARE_PROVIDER_SITE_OTHER): Payer: BC Managed Care – PPO

## 2013-06-06 DIAGNOSIS — E291 Testicular hypofunction: Secondary | ICD-10-CM

## 2013-06-06 MED ORDER — TESTOSTERONE CYPIONATE 100 MG/ML IM SOLN
400.0000 mg | Freq: Once | INTRAMUSCULAR | Status: AC
  Administered 2013-06-06: 400 mg via INTRAMUSCULAR

## 2013-06-19 ENCOUNTER — Encounter: Payer: Self-pay | Admitting: Internal Medicine

## 2013-06-20 ENCOUNTER — Ambulatory Visit (INDEPENDENT_AMBULATORY_CARE_PROVIDER_SITE_OTHER): Payer: BC Managed Care – PPO | Admitting: Pulmonary Disease

## 2013-06-20 ENCOUNTER — Encounter: Payer: Self-pay | Admitting: Pulmonary Disease

## 2013-06-20 VITALS — BP 122/78 | HR 70 | Temp 97.8°F | Ht 76.0 in | Wt 264.2 lb

## 2013-06-20 DIAGNOSIS — G4733 Obstructive sleep apnea (adult) (pediatric): Secondary | ICD-10-CM

## 2013-06-20 NOTE — Patient Instructions (Addendum)
Continue to work on weight loss You have to make the decision whether wearing cpap to reduce your cardiovascular risk is worth the inconvenience of cpap.  If so, continue wearing.  If not, then can discontinue. followup with me in one year if you decide to stay on cpap.

## 2013-06-20 NOTE — Assessment & Plan Note (Signed)
The patient never felt that CPAP helped his sleep or daytime alertness despite wearing on optimal pressure.  He therefore discontinued the device.  I have explained to him that he has severe sleep apnea, and although I am pleased about his weight loss, it is not enough to resolve his degree of sleep apnea.  I reminded him of the cardiovascular helped risk of severe sleep apnea, and recommended that he continue wearing the device until he could lose further weight.  The patient does not want to do this at this time, and I have asked him to weigh the inconvenience of CPAP with the cardiovascular risk.

## 2013-06-20 NOTE — Progress Notes (Signed)
  Subjective:    Patient ID: [name] [name], male    DOB: 03-Jul-1951, 62 y.o.   MRN: [id number]  HPI Patient comes in today for followup of his severe obstructive sleep apnea.  He has decided against wearing CPAP because he did not feel that it helped his sleep or daytime alertness, and it was a bother and inconvenience.  I have explained to the patient that his degree of sleep apnea is a significant cardiovascular risk.  He has lost 18 pounds since the last visit, but I have explained to him this is not adequate to get rid of his sleep apnea.   Review of Systems  Constitutional: Negative for fever and unexpected weight change.  HENT: Negative for ear pain, nosebleeds, congestion, sore throat, rhinorrhea, sneezing, trouble swallowing, dental problem, postnasal drip and sinus pressure.   Eyes: Negative for redness and itching.  Respiratory: Negative for cough, chest tightness, shortness of breath and wheezing.   Cardiovascular: Negative for palpitations and leg swelling.  Gastrointestinal: Negative for nausea and vomiting.  Genitourinary: Negative for dysuria.  Musculoskeletal: Negative for joint swelling.  Skin: Negative for rash.  Neurological: Negative for headaches.  Hematological: Does not bruise/bleed easily.  Psychiatric/Behavioral: Negative for dysphoric mood. The patient is not nervous/anxious.        Objective:   Physical Exam Overweight male in no acute distress Nose without purulence or discharge noted No skin breakdown or pressure necrosis from the CPAP mask Neck without lymphadenopathy or thyromegaly Lower extremities with no significant edema, no cyanosis Alert, appears mildly sleepy, moves all 4 extremities       Assessment & Plan:

## 2013-06-27 ENCOUNTER — Encounter: Payer: Self-pay | Admitting: Family Medicine

## 2013-06-27 ENCOUNTER — Ambulatory Visit (INDEPENDENT_AMBULATORY_CARE_PROVIDER_SITE_OTHER): Payer: BC Managed Care – PPO | Admitting: Family Medicine

## 2013-06-27 ENCOUNTER — Ambulatory Visit (INDEPENDENT_AMBULATORY_CARE_PROVIDER_SITE_OTHER): Payer: BC Managed Care – PPO

## 2013-06-27 ENCOUNTER — Other Ambulatory Visit: Payer: Self-pay

## 2013-06-27 VITALS — BP 132/70 | HR 56

## 2013-06-27 DIAGNOSIS — M629 Disorder of muscle, unspecified: Secondary | ICD-10-CM

## 2013-06-27 DIAGNOSIS — E291 Testicular hypofunction: Secondary | ICD-10-CM

## 2013-06-27 MED ORDER — TESTOSTERONE CYPIONATE 200 MG/ML IM SOLN
400.0000 mg | Freq: Once | INTRAMUSCULAR | Status: AC
  Administered 2013-06-27: 400 mg via INTRAMUSCULAR

## 2013-06-27 MED ORDER — TESTOSTERONE CYPIONATE 200 MG/ML IM SOLN: 400.0000 mg | INTRAMUSCULAR | Status: DC

## 2013-06-27 MED ORDER — MELOXICAM 15 MG PO TABS: 15.0000 mg | ORAL_TABLET | Freq: Every day | ORAL | Status: DC

## 2013-06-27 NOTE — Progress Notes (Signed)
I'm seeing this patient by the request  of:  Dr. Debby Bud  CC: Left foot pain  HPI: Patient is a pleasant 62 year old gentleman who works for the [employer] who is having left foot pain. Patient states it is been multiple weeks duration and seems to be getting worse. Patient does not remember any true injury. Patient states that the pain is mostly in the medial part of the arch and seems to radiate towards his toes. Patient states that the pain is worse everyday.  Worse with first step in AM or after sitting a long amount of time. Better when not bearing weight. Severity 9/10.  Patient describes the pain as a shearing sensation.  Past medical, surgical, family and social history reviewed. Medications reviewed all in the electronic medical record.   Review of Systems: No headache, visual changes, nausea, vomiting, diarrhea, constipation, dizziness, abdominal pain, skin rash, fevers, chills, night sweats, weight loss, swollen lymph nodes, body aches, joint swelling, muscle aches, chest pain, shortness of breath, mood changes.   Objective:    Blood pressure 132/70, pulse 56, SpO2 97.00%.   General: No apparent distress alert and oriented x3 mood and affect normal, dressed appropriately.  HEENT: Pupils equal, extraocular movements intact Respiratory: Patient's speak in full sentences and does not appear short of breath Cardiovascular: No lower extremity edema, non tender, no erythema Skin: Warm dry intact with no signs of infection or rash on extremities or on axial skeleton. Abdomen: Soft nontender Neuro: Cranial nerves II through XII are intact, neurovascularly intact in all extremities with 2+ DTRs and 2+ pulses. Lymph: No lymphadenopathy of posterior or anterior cervical chain or axillae bilaterally.  Gait antalgic gait secondary to pain  MSK: Non tender with full range of motion and good stability and symmetric strength and tone of shoulders, elbows, wrist, hip, knee and ankles bilaterally.   Normal inspection with no visable or palpable fat pad atrophy and no visible swelling/erythema. Ankle: No visible erythema or swelling. Range of motion is full in all directions. Strength is 5/5 in all directions. Stable lateral and medial ligaments; squeeze test and kleiger test unremarkable; Talar dome nontender; No pain at base of 5th MT; No tenderness over cuboid; No tenderness over N spot or navicular prominence No tenderness on posterior aspects of lateral and medial malleolus No sign of peroneal tendon subluxations or tenderness to palpation Negative tarsal tunnel tinel's Able to walk 4 steps. Patient is tender at medial insertion of plantar fascia into calcaneus. Great toe motion: normal Arch shape: pes cavus Other foot breakdown: very tender along the plantar medial arch.  MSK US performed of: Left foot and ankle This study was ordered, performed, and interpreted by Terrilee Files D.O.  Foot/Ankle:   All structures visualized.   Talar dome unremarkable  Ankle mortise without effusion. Peroneus longus and brevis tendons unremarkable on long and transverse views without sheath effusions. Posterior tibialis, flexor hallucis longus, and flexor digitorum longus tendons unremarkable on long and transverse views without sheath effusions. Achilles tendon visualized along length of tendon and unremarkable on long and transverse views without sheath effusion. Anterior Talofibular Ligament and Calcaneofibular Ligaments unremarkable and intact. Deltoid Ligament unremarkable and intact. Plantar fascia does have areas where there appears to be a tear in the fascia itself. This is approximately 2 cm from its origin off of the calcaneus. There is some Doppler flow in the area. Patient also has what appears to be a fibroma that measures approximate 0.5 cm in length just proximal to  the MTP of the along the medial arch planar fashion measures this 1.08 cm  IMPRESSION:  Plantar fascial tear with  fibroma     Impression and Recommendations:     This case required medical decision making of moderate complexity.

## 2013-06-27 NOTE — Patient Instructions (Signed)
Very nice to meet you Please read handouts on Plantar Fascitis.  STRETCHING and Strengthening program critically important.  Strengthening on foot and calf muscles as seen in handout. Calf raises, 2 legged,  Foot massage with tennis ball. Ice baths 2 times a day. Meloxicam daily for 10 days.   Towel Scrunches: get a towel or hand towel, use toes to pick up and scrunch up the towel.  Marble pick-ups, practice picking up marbles with toes and placing into a cup  NEEDS TO BE DONE EVERY DAY  Recommended over the counter insoles. ([employer]co about $30 at [employer])  A rigid shoe with good arch support helps: Dansko (great), Lelon Frohlich No easily bendable shoes.   Wear boot with activity for next 2 weeks.   Come back in 3 weeks.

## 2013-06-27 NOTE — Assessment & Plan Note (Signed)
Patient has what appears to be a plantar fascial tear. Plantar Fascitis: We reviewed that stretching is critically important to the treatment of PF. Reviewed footwear. Rigid soles have been shown to help with PF.  Put in cam walker due to severity of pain and trouble with ambulation.  Night splints can help. Reviewed rehab of stretching and calf raises.  Meloxicam daily for 10 days and discussed icing protocol Could benefit from a corticosteroid injection, orthotics, or other measures if conservative treatment fails. Return for further evaluation 3 weeks.

## 2013-07-08 ENCOUNTER — Telehealth: Payer: Self-pay | Admitting: Pulmonary Disease

## 2013-07-08 NOTE — Telephone Encounter (Signed)
I do have cmn it is in Dr Shelle Iron folder to be signed this weekend.  Will inform [facility] .Kandice Hams

## 2013-07-08 NOTE — Telephone Encounter (Signed)
Alida please advise if you have seen a CMN on pt? thanks

## 2013-07-09 ENCOUNTER — Telehealth: Payer: Self-pay | Admitting: Pulmonary Disease

## 2013-07-09 NOTE — Telephone Encounter (Addendum)
I spoke with the pt spouse and she is upset that the pt came in on 06-20-13 and he has not been given his supplies for his cpap yet. She states this is due to the fact that [facility] has not signed the CMN. I apologized for this and advised that we have the form for Dr. Shelle Iron to sign and that I will speak with him and try to get it signed today. I advised I will also speak with [facility].   I called [facility] and spoke with Jasmine December and asked could they not give the pt the supplies because we have the cmn and it will be signed and faxed back. She states she already spoke with the pt spouse today and advised they are shipping supplies to the pt today.   I called the pt spouse back and advised. She states she understands this. I took the CMN to Dr. Shelle Iron to sign and he signed it and it was faxed to [facility] and placed in scan folder. Carron Curie, CMA

## 2013-07-17 ENCOUNTER — Ambulatory Visit (INDEPENDENT_AMBULATORY_CARE_PROVIDER_SITE_OTHER): Payer: BC Managed Care – PPO | Admitting: Family Medicine

## 2013-07-17 ENCOUNTER — Ambulatory Visit (INDEPENDENT_AMBULATORY_CARE_PROVIDER_SITE_OTHER): Payer: BC Managed Care – PPO | Admitting: *Deleted

## 2013-07-17 ENCOUNTER — Encounter: Payer: Self-pay | Admitting: Family Medicine

## 2013-07-17 VITALS — BP 142/82 | HR 75

## 2013-07-17 DIAGNOSIS — E291 Testicular hypofunction: Secondary | ICD-10-CM

## 2013-07-17 DIAGNOSIS — M629 Disorder of muscle, unspecified: Secondary | ICD-10-CM

## 2013-07-17 DIAGNOSIS — M84375A Stress fracture, left foot, initial encounter for fracture: Secondary | ICD-10-CM | POA: Insufficient documentation

## 2013-07-17 DIAGNOSIS — M8430XA Stress fracture, unspecified site, initial encounter for fracture: Secondary | ICD-10-CM

## 2013-07-17 MED ORDER — TESTOSTERONE CYPIONATE 200 MG/ML IM SOLN
400.0000 mg | Freq: Once | INTRAMUSCULAR | Status: AC
  Administered 2013-07-17: 400 mg via INTRAMUSCULAR

## 2013-07-17 NOTE — Patient Instructions (Signed)
Good to see you The plantar fascia looks much better I do think you have a new stress fracture Only wear the boot without the insoles.  I think this may have contributed You can try a regular shoe and if this feels better than great Ice baths 20 minutes 2 times a day would be helpful Do the exercsies still but only 3 times a week Come back again in 2 weeks. Marland Kitchen

## 2013-07-17 NOTE — Assessment & Plan Note (Signed)
Unfortunately patient was wearing the boot day and night and this did never allow for the swelling to come out of the joint. The swelling but increased pressure on the metatarsal heads which unfortunately didn't lead to the stress fracture. Patient also did have a arch support to should not have been in the boot that probably increase the likelihood of discomfort. Patient will wear the boot now without any insole in the boot. Patient will decrease exercises 3 times a week. We discussed vitamin D supplementation. Patient will come back again in 2 weeks for further evaluation.

## 2013-07-17 NOTE — Assessment & Plan Note (Signed)
Patient's tear seems to be improving. I do think patient was using the but somewhat more than necessary and unfortunately wearing them at night which is giving him the myositis we see on the top of his foot. The patient will decrease the exercises 3 times a week. Encourage him to try shoe and if this feels better than great otherwise continue to wear the boot. I do want to see him again in 2 weeks to make sure he is doing better.

## 2013-07-17 NOTE — Progress Notes (Signed)
CC: Left foot pain plantar fasciitis  HPI: Patient is a very pleasant 62 year old gentleman coming in for followup of left foot pain. Patient was diagnosed with a plantar fascial tear as well as a fibroma. Patient states that he is doing significantly better with no pain on the bottom of his foot but unfortunately over the course last 3 days has had worsening pain on the top of his foot. Patient has been wearing the boot at all times and unfortunately has been wearing it at night as well. Patient is also put in a arch support in the boot to see if that would help and he thinks that might have even made it worse. Patient denies any numbness. Overall he states he is doing much better because he does not need any pain medications but still is having pain that is now new. He describes it as a sharp pain and rates it 7/10 in severity. Patient is able to do all his ambulation though as long as he wears the boot.  Past medical, surgical, family and social history reviewed. Medications reviewed all in the electronic medical record.   Review of Systems: No headache, visual changes, nausea, vomiting, diarrhea, constipation, dizziness, abdominal pain, skin rash, fevers, chills, night sweats, weight loss, swollen lymph nodes, body aches, joint swelling, muscle aches, chest pain, shortness of breath, mood changes.   Objective:    Blood pressure 142/82, pulse 75, SpO2 96.00%.   General: No apparent distress alert and oriented x3 mood and affect normal, dressed appropriately.  HEENT: Pupils equal, extraocular movements intact Respiratory: Patient's speak in full sentences and does not appear short of breath Cardiovascular: No lower extremity edema, non tender, no erythema Skin: Warm dry intact with no signs of infection or rash on extremities or on axial skeleton. Abdomen: Soft nontender Neuro: Cranial nerves II through XII are intact, neurovascularly intact in all extremities with 2+ DTRs and 2+  pulses. Lymph: No lymphadenopathy of posterior or anterior cervical chain or axillae bilaterally.  Gait antalgic gait secondary to pain  MSK: Non tender with full range of motion and good stability and symmetric strength and tone of shoulders, elbows, wrist, hip, knee and ankles bilaterally.  Normal inspection with no visable or palpable fat pad atrophy and no visible swelling/erythema. Ankle: No visible erythema or swelling. Range of motion is full in all directions. Strength is 5/5 in all directions. Stable lateral and medial ligaments; squeeze test and kleiger test unremarkable; Talar dome nontender; No pain at base of 5th MT; No tenderness over cuboid; No tenderness over N spot or navicular prominence No tenderness on posterior aspects of lateral and medial malleolus No sign of peroneal tendon subluxations or tenderness to palpation Negative tarsal tunnel tinel's Able to walk 4 steps. Patient is no longer tender at the medial insertion of the plantar fascia Great toe motion: normal Arch shape: pes cavus Other foot breakdown: breakdown of transverse arch patient is tender to palpation unfortunately of the dorsal aspect especially of the fourth metatarsal. Patient does have trace effusion over this area as well.   MSK US performed of: Left foot and ankle This study was ordered, performed, and interpreted by Terrilee Files D.O.  Foot/Ankle:   All structures visualized.   Talar dome unremarkable  Ankle mortise without effusion. Peroneus longus and brevis tendons unremarkable on long and transverse views without sheath effusions. Posterior tibialis, flexor hallucis longus, and flexor digitorum longus tendons unremarkable on long and transverse views without sheath effusions. Achilles tendon visualized along  length of tendon and unremarkable on long and transverse views without sheath effusion. Anterior Talofibular Ligament and Calcaneofibular Ligaments unremarkable and intact. Deltoid  Ligament unremarkable and intact. Plantar fascia does have areas where there appears to be a tear in the fascia itself. Tear seems to be improving. Plantar fascia itself measures 0.87. This is significantly decreased from prior exam. Patient does have some what appears to be inflammation of the muscles on the dorsal aspect of the foot. There also seems to be a very small stress fracture with no increase Doppler flow over the fourth metatarsal just distal to the MTP joint. IMPRESSION:  Plantar fascial tear with fibroma that is improving as well as new stress fracture     Impression and Recommendations:     This case required medical decision making of moderate complexity.

## 2013-07-24 ENCOUNTER — Encounter (HOSPITAL_COMMUNITY): Payer: Self-pay | Admitting: Physician Assistant

## 2013-07-24 ENCOUNTER — Encounter (INDEPENDENT_AMBULATORY_CARE_PROVIDER_SITE_OTHER): Payer: Self-pay

## 2013-07-24 ENCOUNTER — Ambulatory Visit (INDEPENDENT_AMBULATORY_CARE_PROVIDER_SITE_OTHER): Payer: BC Managed Care – PPO | Admitting: Physician Assistant

## 2013-07-24 DIAGNOSIS — F332 Major depressive disorder, recurrent severe without psychotic features: Secondary | ICD-10-CM

## 2013-07-24 DIAGNOSIS — F988 Other specified behavioral and emotional disorders with onset usually occurring in childhood and adolescence: Secondary | ICD-10-CM

## 2013-07-24 MED ORDER — FLUOXETINE HCL 40 MG PO CAPS: 40.0000 mg | ORAL_CAPSULE | Freq: Every day | ORAL | Status: DC

## 2013-07-24 NOTE — Progress Notes (Signed)
   [facility] Follow-up Outpatient Visit  [name] [name] April 13, 1951  Date: 07/24/2013   Subjective: [name] [name] presents today to followup on history that for depression. Reports that his mood has been "so-so." He complains that he feels shunned by his coworkers, which increases his depression. He was unable to afford the Brintellix.  We reviewed medications tried in the past, and he has never been on Prozac. He reports that Cymbalta and Effexor caused his depression to increase. He denies any suicidal or homicidal ideation. He denies any auditory or visual hallucinations.  There were no vitals filed for this visit.  Mental Status Examination  Appearance: Casual Alert: Yes Attention: good  Cooperative: Yes Eye Contact: Good Speech: Clear and coherent Psychomotor Activity: Psychomotor Retardation Memory/Concentration: Intact Oriented: person, place, time/date and situation Mood: Depressed Affect: Constricted Thought Processes and Associations: Linear Fund of Knowledge: Good Thought Content: Normal Insight: Fair Judgement: Fair  Diagnosis: Maj. depressive disorder, recurrent, severe  Treatment Plan: We will cross taper his Celexa to Prozac 40 mg daily. We will continue his Wellbutrin XL 300 mg daily, and Deplin 15 mg daily.  Todd Argabright, PA-C

## 2013-07-29 ENCOUNTER — Encounter: Payer: Self-pay | Admitting: Internal Medicine

## 2013-08-06 ENCOUNTER — Telehealth: Payer: Self-pay

## 2013-08-06 NOTE — Telephone Encounter (Signed)
Patient wife called twice saying that her husbands foot is swollen and painful. Was requesting call [phone]

## 2013-08-06 NOTE — Telephone Encounter (Signed)
Pt has an upcoming appt with Dr. Katrinka Blazing 11.5.14.

## 2013-08-07 ENCOUNTER — Ambulatory Visit (INDEPENDENT_AMBULATORY_CARE_PROVIDER_SITE_OTHER): Payer: BC Managed Care – PPO | Admitting: Family Medicine

## 2013-08-07 ENCOUNTER — Ambulatory Visit: Payer: Self-pay | Admitting: Internal Medicine

## 2013-08-07 ENCOUNTER — Ambulatory Visit (INDEPENDENT_AMBULATORY_CARE_PROVIDER_SITE_OTHER): Payer: BC Managed Care – PPO

## 2013-08-07 ENCOUNTER — Encounter: Payer: Self-pay | Admitting: Family Medicine

## 2013-08-07 DIAGNOSIS — E291 Testicular hypofunction: Secondary | ICD-10-CM

## 2013-08-07 DIAGNOSIS — G5782 Other specified mononeuropathies of left lower limb: Secondary | ICD-10-CM

## 2013-08-07 DIAGNOSIS — G5762 Lesion of plantar nerve, left lower limb: Secondary | ICD-10-CM

## 2013-08-07 DIAGNOSIS — G5760 Lesion of plantar nerve, unspecified lower limb: Secondary | ICD-10-CM

## 2013-08-07 DIAGNOSIS — M629 Disorder of muscle, unspecified: Secondary | ICD-10-CM

## 2013-08-07 MED ORDER — TESTOSTERONE CYPIONATE 100 MG/ML IM SOLN
400.0000 mg | Freq: Once | INTRAMUSCULAR | Status: AC
  Administered 2013-08-07: 400 mg via INTRAMUSCULAR

## 2013-08-07 MED ORDER — MELOXICAM 15 MG PO TABS: 15.0000 mg | ORAL_TABLET | Freq: Every day | ORAL | Status: DC

## 2013-08-07 NOTE — Assessment & Plan Note (Signed)
Patient had injection today and tolerated the procedure well. I do think patient is to come out of the boot whenever possible. Encourage him to get the foot elevated as much as possible. I expect him to improve. If patient is still having some pain the next followup I will consider making custom orthotics.

## 2013-08-07 NOTE — Progress Notes (Signed)
CC: Left foot pain plantar fasciitis, and dorsal swelling followup  HPI: Patient is a very pleasant 62 year old gentleman coming in for followup of left foot pain. Patient is here for followup of the dorsal swelling. Patient was wearing the Cam Walker at night which I think Costen to have significant discomfort. Patient states since that time unfortunately that pain has not gone away. Patient is able to point now to a localized area mostly between the third and fourth toes that seems to be severe pain. Worse with ambulation and can have a dull ache at night. Patient denies any numbness in the toes. Patient states that the swelling has improved. Patient states to the plantar fasciitis does seem to completely resolve at this time.  Patient has not tried wearing shoes at this time.  Past medical, surgical, family and social history reviewed. Medications reviewed all in the electronic medical record.   Review of Systems: No headache, visual changes, nausea, vomiting, diarrhea, constipation, dizziness, abdominal pain, skin rash, fevers, chills, night sweats, weight loss, swollen lymph nodes, body aches, joint swelling, muscle aches, chest pain, shortness of breath, mood changes.   Physical exam  Vitals review     General: No apparent distress alert and oriented x3 mood and affect normal, dressed appropriately.  HEENT: Pupils equal, extraocular movements intact Respiratory: Patient's speak in full sentences and does not appear short of breath Cardiovascular: No lower extremity edema, non tender, no erythema Skin: Warm dry intact with no signs of infection or rash on extremities or on axial skeleton. Abdomen: Soft nontender Neuro: Cranial nerves II through XII are intact, neurovascularly intact in all extremities with 2+ DTRs and 2+ pulses. Lymph: No lymphadenopathy of posterior or anterior cervical chain or axillae bilaterally.  Gait antalgic gait secondary to pain  MSK: Non tender with full range  of motion and good stability and symmetric strength and tone of shoulders, elbows, wrist, hip, knee and ankles bilaterally.  Normal inspection with no visable or palpable fat pad atrophy and no visible swelling/erythema. Ankle: No visible erythema or swelling. Range of motion is full in all directions. Strength is 5/5 in all directions. Stable lateral and medial ligaments; squeeze test and kleiger test unremarkable; Talar dome nontender; No pain at base of 5th MT; No tenderness over cuboid; No tenderness over N spot or navicular prominence No tenderness on posterior aspects of lateral and medial malleolus No sign of peroneal tendon subluxations or tenderness to palpation Negative tarsal tunnel tinel's Able to walk 4 steps. Patient is no longer tender at the medial insertion of the plantar fascia Great toe motion: normal Arch shape: pes cavus Other foot breakdown: breakdown of transverse arch patient is tender to palpation unfortunately of the dorsal aspect especially of the third and fourth fourth metatarsal . Patient does have trace effusion over this area as well. Positive compression sign the   MSK US performed of: Left foot and ankle This study was ordered, performed, and interpreted by Terrilee Files D.O.  Foot/Ankle:   All structures visualized.   Talar dome unremarkable  Ankle mortise without effusion. Peroneus longus and brevis tendons unremarkable on long and transverse views without sheath effusions. Posterior tibialis, flexor hallucis longus, and flexor digitorum longus tendons unremarkable on long and transverse views without sheath effusions. Achilles tendon visualized along length of tendon and unremarkable on long and transverse views without sheath effusion. Anterior Talofibular Ligament and Calcaneofibular Ligaments unremarkable and intact. Deltoid Ligament unremarkable and intact. Patient though between the third and fourth metatarsal  area and does have an area of  fluctuance as well as what appears to be an enlarged nerve. This corresponds well with the neuroma. Looking at the bones there is no further signs of stress fracture or stress reaction.  Impression: Morton's neuroma  After verbal consent patient was prepped with alcohol swabs in a sterile fashion then had a 27-gauge half inch needle inserted between the third and fourth metatarsal with ultrasound guidance. Images were taken. Patient did have injection of 1 cc of Marcaine and 1 cc of Kenalog 40 mg/dL. Patient tolerated the procedure well with minimal blood loss. Patient did have some improvement in pain immediately. Post injection instructions given.    Impression and Recommendations:     This case required medical decision making of moderate complexity.

## 2013-08-07 NOTE — Assessment & Plan Note (Signed)
Patient seems to completely resolve most of his pain at this time. Patient is to come out of the boot in the next 48 hours. Patient is to wear insoles that he had over-the-counter. Patient will come back in 2-3 weeks. Patient does have some breakdown of the transverse arch as well as hammering of the second and third toes on this left foot. Patient may respond well to custom orthotics and we may consider this a followup.

## 2013-08-07 NOTE — Patient Instructions (Addendum)
Wear the boot for 48 hours.  The transition to shoes.  Continue the medicine if you need it.  Ice 20 minutes still 2 times a day Come back in 2-3 weeks in shoes hopefully.  Morton's Neuroma in Sports  (Interdigital Plantar Neuroma) Morton's neuroma is a condition of the nervous system that results in pain or loss of feeling in the toes. The disease is caused by the bones of the foot squeezing the nerve that runs between two toes (interdigital nerve). The third and fourth toes are most likely to be affected by this disease. SYMPTOMS   Tingling, numbness, burning, or electric shocks in the front of the foot, often involving the third and fourth toes, although it may involve any other pair of toes.  Pain and tenderness in the front of the foot, that gets worse when walking.  Pain that gets worse when pressure is applied to the foot (wearing shoes).  Severe pain in the front of the foot, when standing on the front of the foot (on tiptoes), such as with running, jumping, pivoting, or dancing. CAUSES  Morton's neuroma is caused by swelling of the nerve between two toes. This swelling causes the nerve to be pinched between the bones of the foot. RISK INCREASES WITH:  Recurring foot or ankle injuries.  Poor fitting or worn shoes, with minimal padding and shock absorbers.  Loose ligaments of the foot, causing thickening of the nerve.  Poor foot strength and flexibility. PREVENTION  Warm up and stretch properly before activity.  Maintain physical fitness:  Foot and ankle flexibility.  Muscle strength and endurance.  Cardiovascular fitness.  Wear properly fitted and padded shoes.  Wear arch supports (orthotics), when needed. PROGNOSIS  If treated properly, Morton's neuroma can usually be cured with non-surgical treatment. For certain cases, surgery may be needed. RELATED COMPLICATIONS  Permanent numbness and pain in the foot.  Inability to participate in athletics, because of  pain. TREATMENT Treatment first involves stopping any activities that make the symptoms worse. The use of ice and medicine will help reduce pain and inflammation. Wearing shoes with a wide toe box, and an orthotic arch support or metatarsal bar, may also reduce pain. Your caregiver may give you a corticosteroid injection, to further reduce inflammation. If non-surgical treatment is unsuccessful, surgery may be needed. Surgery to fix Morton's neuroma is often performed as an outpatient procedure, meaning you can go home the same day as the surgery. The procedure involves removing the source of pressure on the nerve. If it is necessary to remove the nerve, you can expect persistent numbness. MEDICATION  If pain medicine is needed, nonsteroidal anti-inflammatory medicines (aspirin and ibuprofen), or other minor pain relievers (acetaminophen), are often advised.  Do not take pain medicine for 7 days before surgery.  Prescription pain relievers are usually prescribed only after surgery. Use only as directed and only as much as you need.  Corticosteroid injections are used in extreme cases, to reduce inflammation. These injections should be done only if necessary, because they may be given only a limited number of times. HEAT AND COLD  Cold treatment (icing) should be applied for 10 to 15 minutes every 2 to 3 hours for inflammation and pain, and immediately after activity that aggravates your symptoms. Use ice packs or an ice massage.  Heat treatment may be used before performing stretching and strengthening activities prescribed by your caregiver, physical therapist, or athletic trainer. Use a heat pack or a warm water soak. SEEK MEDICAL CARE  IF:   Symptoms get worse or do not improve in 2 weeks, despite treatment.  After surgery you develop increasing pain, swelling, redness, increased warmth, bleeding, drainage of fluids, or fever.  New, unexplained symptoms develop. (Drugs used in treatment may  produce side effects.) Document Released: 07/27/2005 Document Revised: 12/12/2011 Document Reviewed: 01/01/2009 [facility] Patient Information 2014 [place], [place].

## 2013-08-11 ENCOUNTER — Other Ambulatory Visit (HOSPITAL_COMMUNITY): Payer: Self-pay | Admitting: Physician Assistant

## 2013-08-18 ENCOUNTER — Other Ambulatory Visit (HOSPITAL_COMMUNITY): Payer: Self-pay | Admitting: Physician Assistant

## 2013-08-18 DIAGNOSIS — F332 Major depressive disorder, recurrent severe without psychotic features: Secondary | ICD-10-CM

## 2013-08-19 NOTE — Telephone Encounter (Signed)
Chart reviewed Appt 08/28/13  with Dr. Lolly Mustache Refill appropriate

## 2013-08-28 ENCOUNTER — Encounter (HOSPITAL_COMMUNITY): Payer: Self-pay | Admitting: Psychiatry

## 2013-08-28 ENCOUNTER — Encounter: Payer: Self-pay | Admitting: Family Medicine

## 2013-08-28 ENCOUNTER — Ambulatory Visit (INDEPENDENT_AMBULATORY_CARE_PROVIDER_SITE_OTHER): Payer: BC Managed Care – PPO | Admitting: *Deleted

## 2013-08-28 ENCOUNTER — Ambulatory Visit (INDEPENDENT_AMBULATORY_CARE_PROVIDER_SITE_OTHER): Payer: BC Managed Care – PPO | Admitting: Psychiatry

## 2013-08-28 ENCOUNTER — Ambulatory Visit (INDEPENDENT_AMBULATORY_CARE_PROVIDER_SITE_OTHER): Payer: BC Managed Care – PPO | Admitting: Family Medicine

## 2013-08-28 VITALS — BP 140/78 | HR 69 | Ht 76.0 in | Wt 262.0 lb

## 2013-08-28 VITALS — BP 154/92 | HR 66

## 2013-08-28 DIAGNOSIS — F329 Major depressive disorder, single episode, unspecified: Secondary | ICD-10-CM

## 2013-08-28 DIAGNOSIS — G5762 Lesion of plantar nerve, left lower limb: Secondary | ICD-10-CM

## 2013-08-28 DIAGNOSIS — M2420 Disorder of ligament, unspecified site: Secondary | ICD-10-CM

## 2013-08-28 DIAGNOSIS — Z79899 Other long term (current) drug therapy: Secondary | ICD-10-CM

## 2013-08-28 DIAGNOSIS — G5760 Lesion of plantar nerve, unspecified lower limb: Secondary | ICD-10-CM

## 2013-08-28 DIAGNOSIS — E291 Testicular hypofunction: Secondary | ICD-10-CM

## 2013-08-28 DIAGNOSIS — M629 Disorder of muscle, unspecified: Secondary | ICD-10-CM

## 2013-08-28 DIAGNOSIS — F332 Major depressive disorder, recurrent severe without psychotic features: Secondary | ICD-10-CM

## 2013-08-28 MED ORDER — TESTOSTERONE CYPIONATE 200 MG/ML IM SOLN
400.0000 mg | Freq: Once | INTRAMUSCULAR | Status: AC
  Administered 2013-08-28: 400 mg via INTRAMUSCULAR

## 2013-08-28 MED ORDER — FLUOXETINE HCL 40 MG PO CAPS: 40.0000 mg | ORAL_CAPSULE | Freq: Every day | ORAL | Status: DC

## 2013-08-28 NOTE — Progress Notes (Signed)
Pre-visit discussion using our clinic review tool. No additional management support is needed unless otherwise documented below in the visit note.  

## 2013-08-28 NOTE — Assessment & Plan Note (Signed)
Seems to be mostly resolved at this time. Discuss with him to watch out and monitor for any other recurrent symptoms. Hopefully he'll do very well. Patient has recurrent symptoms we will consider doing another steroid injection versus gabapentin.

## 2013-08-28 NOTE — Assessment & Plan Note (Signed)
Patient is starting to have some more pain on the plantar aspect of his foot. I would like patient to restart his conservative therapy doing exercises at least 3 times a week as well as icing protocol. Patient has worsening pain her pain is not completely resolved in 6 weeks and will come back and we'll make him custom orthotics.

## 2013-08-28 NOTE — Progress Notes (Addendum)
[facility] [address] Progress Note  [name] [name] [id number] 62 y.o.  08/28/2013 11:48 AM  Chief Complaint:  I think Prozac is helping me.  History of Present Illness:  [name] [name] is 62 year old Caucasian employed married man who came for his appointment.  He's been seen in this office since September 2009 by physician Asst. Rae Halsted who has left the practice.  This is the first time I am seeing this patient.  Patient has long history of depression .  In the past few years he had tried multiple psychotropic medication .  Recently he tried Prozac because his Paxil and Celexa was not working.  He is taking Prozac 40 mg along with Wellbutrin XL 300 mg.  Patient is showing some improvement .  He is less depressed and less irritable.  He is sleeping better.  He has a better energy level .  However he still has lack of motivation and social isolation.  He denies any tremors or shakes from the medication.  He likes Prozac better than Celexa.  He denies any paranoia, hallucination or any suicidal thoughts.  He sees Dr. Debby Bud for hypogonadism and low testosterone level. His appetite is okay, but he has craving for sweets.  He denies drinking alcohol or using any illegal substances.  Patient is not seeing any therapist.  Suicidal Ideation: No Plan Formed: No Patient has means to carry out plan: No  Homicidal Ideation: No Plan Formed: No Patient has means to carry out plan: No  Review of Systems: Psychiatric: Agitation: No Hallucination: No Depressed Mood: Yes Insomnia: No Hypersomnia: Yes Altered Concentration: No Feels Worthless: No Grandiose Ideas: No Belief In Special Powers: No New/Increased Substance Abuse: No Compulsions: No  Neurologic: Headache: No Seizure: No Paresthesias: No  Past Medical History: Hypogonadism  Social history. Patient is employed and married.  He works third shift a Comptroller.  He has 3 children.  Outpatient Encounter Prescriptions as of  08/28/2013  Medication Sig  . buPROPion (WELLBUTRIN XL) 300 MG 24 hr tablet TAKE ONE TABLET BY MOUTH EVERY MORNING   . FLUoxetine (PROZAC) 40 MG capsule Take 1 capsule (40 mg total) by mouth daily.  . L-methylfolate Calcium 15 MG TABS TAKE ONE TABLET BY MOUTH ONE TIME DAILY   . meloxicam (MOBIC) 15 MG tablet Take 1 tablet (15 mg total) by mouth daily.  Marland Kitchen testosterone cypionate (DEPO-TESTOSTERONE) 200 MG/ML injection Inject 2 mLs (400 mg total) into the muscle every 21 ( twenty-one) days.  . [DISCONTINUED] FLUoxetine (PROZAC) 40 MG capsule Take 1 capsule (40 mg total) by mouth daily.    Past Psychiatric History/Hospitalization(s): Patient denies any history of suicidal attempt or any inpatient psychiatric treatment.  He denies any history of mania, psychosis, paranoia or any hallucination.  In the past he had tried Effexor, Abilify, Paxil, Cymbalta, Zoloft, amitriptyline, Pristiq, Britnellex, Celexa, Deplin and Vyvanse.  He is seen in this office since September 2009.  He has seen multiple psychiatrists in the past.  Anxiety: No Bipolar Disorder: No Depression: Yes Mania: No Psychosis: No Schizophrenia: No Personality Disorder: No Hospitalization for psychiatric illness: No History of Electroconvulsive Shock Therapy: No Prior Suicide Attempts: No  Physical Exam: Constitutional:  BP 140/78  Pulse 69  Ht 6\' 4"  (1.93 m)  Wt 262 lb (118.842 kg)  BMI 31.90 kg/m2  General Appearance: alert, oriented, no acute distress, well nourished and casually dressed.   Musculoskeletal: Strength & Muscle Tone: decreased Gait & Station: normal Patient leans: N/A  Psychiatric: Speech (  describe rate, volume, coherence, spontaneity, and abnormalities if any): Clear and coherent at a rate rate and rhythm and normal volume  Thought Process (describe rate, content, abstract reasoning, and computation):  Slow however no flight of ideas or any loose association.    Associations: Intact and Some thought  blocking  Thoughts: normal  Mental Status: Orientation: oriented to person, place, time/date and situation Mood & Affect: depressed affect Attention Span & Concentration: Intact  Medical Decision Making (Choose Three): Established Problem, Stable/Improving (1), Review of Psycho-Social Stressors (1), Review or order clinical lab tests (1), Decision to obtain old records (1), Review and summation of old records (2), Review of Last Therapy Session (1) and Review of New Medication or Change in Dosage (2)  Assessment: Axis I: Maj. depressive disorder, recurrent, severe  Axis II: Deferred  Axis III: Hypogonadism  Axis IV: Moderate  Axis V: 50   Plan:  I reviewed his records, psychosocial history, past history and current medication.  Patient is doing better on Prozac 40 mg and Wellbutrin XL 300 mg daily.  He does not have any side effects.  We do not have any blood work results in a while.  Recommend CBC, CMP hemoglobin A1c.  Continue current medication.  Recommend counseling the patient does not see any improvement with current medication.  Followup in 2 months.  Time spent 25 minutes.  More than 50% of the time spent in psychoeducation, counseling and coordination of care.  Discuss safety plan that anytime having active suicidal thoughts or homicidal thoughts then patient need to call 911 or go to the local emergency room.  Andreyah Natividad T., MD 08/28/2013

## 2013-08-28 NOTE — Patient Instructions (Signed)
Good to see you Start stretches and exercises again 2-3 times a week Consider icing after work for 20 minutes Come back in 6 weeks if not great then we will make you custom orthotics Happy New Year!

## 2013-08-28 NOTE — Progress Notes (Signed)
  CC: Left foot pain   HPI: Patient is following up for left foot pain. Patient was having a Morton's neuroma at last visit. Patient did have an injection at that time. Patient continue with some home exercises, anti-inflammatories, as well as icing. Patient states he is doing much better. Patient is no longer having any pain between the toes. Patient though feels that the plantar fasciitis is coming back somewhat. This is very minimal. Patient is no longer doing the exercises, taking anti-inflammatories, or icing. Patient only notices it vaguely from time to time. Does not stop him from any activities in no nighttime awakening.  Past medical, surgical, family and social history reviewed. Medications reviewed all in the electronic medical record.   Review of Systems: No headache, visual changes, nausea, vomiting, diarrhea, constipation, dizziness, abdominal pain, skin rash, fevers, chills, night sweats, weight loss, swollen lymph nodes, body aches, joint swelling, muscle aches, chest pain, shortness of breath, mood changes.   Physical exam  Blood pressure 154/92, pulse 66, SpO2 97.00%.     General: No apparent distress alert and oriented x3 mood and affect normal, dressed appropriately.  HEENT: Pupils equal, extraocular movements intact Respiratory: Patient's speak in full sentences and does not appear short of breath Cardiovascular: No lower extremity edema, non tender, no erythema Skin: Warm dry intact with no signs of infection or rash on extremities or on axial skeleton. Abdomen: Soft nontender Neuro: Cranial nerves II through XII are intact, neurovascularly intact in all extremities with 2+ DTRs and 2+ pulses. Lymph: No lymphadenopathy of posterior or anterior cervical chain or axillae bilaterally.  Gait antalgic gait secondary to pain  MSK: Non tender with full range of motion and good stability and symmetric strength and tone of shoulders, elbows, wrist, hip, knee and ankles bilaterally.   Normal inspection with no visable or palpable fat pad atrophy and no visible swelling/erythema. Ankle: No visible erythema or swelling. Range of motion is full in all directions. Strength is 5/5 in all directions. Stable lateral and medial ligaments; squeeze test and kleiger test unremarkable; Talar dome nontender; No pain at base of 5th MT; No tenderness over cuboid; No tenderness over N spot or navicular prominence No tenderness on posterior aspects of lateral and medial malleolus No sign of peroneal tendon subluxations or tenderness to palpation Negative tarsal tunnel tinel's Able to walk 4 steps. Patient does have minimal tenderness at the medial insertion of the plantar fascia: Along the plantar aspect of the foot. Great toe motion: normal Arch shape: pes cavus Other foot breakdown: breakdown of transverse arch p    Impression and Recommendations:     This case required medical decision making of moderate complexity.

## 2013-09-18 ENCOUNTER — Ambulatory Visit: Payer: Self-pay

## 2013-09-19 ENCOUNTER — Ambulatory Visit (INDEPENDENT_AMBULATORY_CARE_PROVIDER_SITE_OTHER): Payer: BC Managed Care – PPO

## 2013-09-19 DIAGNOSIS — E291 Testicular hypofunction: Secondary | ICD-10-CM

## 2013-09-19 MED ORDER — TESTOSTERONE CYPIONATE 200 MG/ML IM SOLN
400.0000 mg | Freq: Once | INTRAMUSCULAR | Status: AC
  Administered 2013-09-19: 400 mg via INTRAMUSCULAR

## 2013-10-02 ENCOUNTER — Other Ambulatory Visit (INDEPENDENT_AMBULATORY_CARE_PROVIDER_SITE_OTHER): Payer: BC Managed Care – PPO

## 2013-10-02 ENCOUNTER — Encounter: Payer: Self-pay | Admitting: Internal Medicine

## 2013-10-02 ENCOUNTER — Ambulatory Visit (INDEPENDENT_AMBULATORY_CARE_PROVIDER_SITE_OTHER): Payer: BC Managed Care – PPO | Admitting: Internal Medicine

## 2013-10-02 ENCOUNTER — Ambulatory Visit (INDEPENDENT_AMBULATORY_CARE_PROVIDER_SITE_OTHER)
Admission: RE | Admit: 2013-10-02 | Discharge: 2013-10-02 | Disposition: A | Discharge status: Home / Self Care | Payer: BC Managed Care – PPO | Source: Ambulatory Visit | Attending: Internal Medicine | Admitting: Internal Medicine

## 2013-10-02 VITALS — BP 108/60 | HR 75 | Temp 98.5°F | Resp 16 | Ht 76.0 in | Wt 262.0 lb

## 2013-10-02 DIAGNOSIS — R05 Cough: Secondary | ICD-10-CM

## 2013-10-02 DIAGNOSIS — R0789 Other chest pain: Secondary | ICD-10-CM

## 2013-10-02 DIAGNOSIS — J189 Pneumonia, unspecified organism: Secondary | ICD-10-CM

## 2013-10-02 DIAGNOSIS — R7309 Other abnormal glucose: Secondary | ICD-10-CM

## 2013-10-02 DIAGNOSIS — R059 Cough, unspecified: Secondary | ICD-10-CM

## 2013-10-02 LAB — HEMOGLOBIN A1C: Hgb A1c MFr Bld: 6 % (ref 4.6–6.5)

## 2013-10-02 LAB — CBC WITH DIFFERENTIAL/PLATELET
Basophils Absolute: 0 10*3/uL (ref 0.0–0.1)
Basophils Relative: 0.4 % (ref 0.0–3.0)
Eosinophils Absolute: 0.2 10*3/uL (ref 0.0–0.7)
Hemoglobin: 15.4 g/dL (ref 13.0–17.0)
Lymphocytes Relative: 26.6 % (ref 12.0–46.0)
MCHC: 33.8 g/dL (ref 30.0–36.0)
MCV: 89.3 fl (ref 78.0–100.0)
Monocytes Absolute: 0.5 10*3/uL (ref 0.1–1.0)
Neutrophils Relative %: 58.9 % (ref 43.0–77.0)
Platelets: 266 10*3/uL (ref 150.0–400.0)
RBC: 5.08 Mil/uL (ref 4.22–5.81)
RDW: 13.9 % (ref 11.5–14.6)

## 2013-10-02 LAB — TSH: TSH: 1.2 u[IU]/mL (ref 0.35–5.50)

## 2013-10-02 LAB — COMPREHENSIVE METABOLIC PANEL
Alkaline Phosphatase: 68 U/L (ref 39–117)
BUN: 12 mg/dL (ref 6–23)
Calcium: 9 mg/dL (ref 8.4–10.5)
Creatinine, Ser: 1.2 mg/dL (ref 0.4–1.5)
Glucose, Bld: 101 mg/dL — ABNORMAL HIGH (ref 70–99)
Sodium: 139 mEq/L (ref 135–145)
Total Bilirubin: 0.7 mg/dL (ref 0.3–1.2)

## 2013-10-02 LAB — CARDIAC PANEL
Relative Index: 2.5 calc (ref 0.0–2.5)
Total CK: 61 U/L (ref 7–232)

## 2013-10-02 LAB — BRAIN NATRIURETIC PEPTIDE: Pro B Natriuretic peptide (BNP): 11 pg/mL (ref 0.0–100.0)

## 2013-10-02 MED ORDER — MOXIFLOXACIN HCL 400 MG PO TABS: 400.0000 mg | ORAL_TABLET | Freq: Every day | ORAL | Status: DC

## 2013-10-02 MED ORDER — HYDROCODONE-HOMATROPINE 5-1.5 MG/5ML PO SYRP: 5.0000 mL | ORAL_SOLUTION | Freq: Three times a day (TID) | ORAL | Status: DC | PRN

## 2013-10-02 NOTE — Assessment & Plan Note (Signed)
His CXR is normal 

## 2013-10-02 NOTE — Progress Notes (Signed)
Subjective:    Patient ID: [name] [name], male    DOB: 03-06-1951, 62 y.o.   MRN: [id number]  Cough This is a new problem. The current episode started in the past 7 days. The problem has been gradually worsening. The problem occurs every few hours. The cough is productive of purulent sputum. Associated symptoms include chest pain (tightness in his chest, this is a recurrent probelm for him), chills, a fever, shortness of breath and sweats. Pertinent negatives include no ear congestion, ear pain, headaches, heartburn, hemoptysis, myalgias, nasal congestion, postnasal drip, rash, rhinorrhea, sore throat, weight loss or wheezing. The symptoms are aggravated by cold air. He has tried OTC cough suppressant for the symptoms. The treatment provided no relief. There is no history of asthma, bronchiectasis, bronchitis, COPD, emphysema, environmental allergies or pneumonia.      Review of Systems  Constitutional: Positive for fever and chills. Negative for weight loss, diaphoresis, activity change, appetite change, fatigue and unexpected weight change.  HENT: Negative.  Negative for ear pain, postnasal drip, rhinorrhea, sinus pressure, sneezing, sore throat, tinnitus, trouble swallowing and voice change.   Eyes: Negative.   Respiratory: Positive for cough and shortness of breath. Negative for apnea, hemoptysis, choking, chest tightness, wheezing and stridor.   Cardiovascular: Positive for chest pain (tightness in his chest, this is a recurrent probelm for him). Negative for palpitations and leg swelling.  Gastrointestinal: Negative.  Negative for heartburn, nausea, vomiting, abdominal pain, diarrhea, constipation and blood in stool.  Endocrine: Negative.   Genitourinary: Negative.   Musculoskeletal: Negative.  Negative for myalgias.  Skin: Negative.  Negative for rash.  Allergic/Immunologic: Negative.  Negative for environmental allergies.  Neurological: Negative.  Negative for headaches.    Hematological: Negative.  Negative for adenopathy. Does not bruise/bleed easily.  Psychiatric/Behavioral: Negative.        Objective:   Physical Exam  Vitals reviewed. Constitutional: He is oriented to person, place, and time. He appears well-developed and well-nourished.  Non-toxic appearance. He does not have a sickly appearance. He does not appear ill. No distress.  HENT:  Head: Normocephalic and atraumatic.  Mouth/Throat: Oropharynx is clear and moist. No oropharyngeal exudate.  Eyes: Conjunctivae are normal. Right eye exhibits no discharge. Left eye exhibits no discharge. No scleral icterus.  Neck: Normal range of motion. Neck supple. No JVD present. No tracheal deviation present. No thyromegaly present.  Cardiovascular: Normal rate, regular rhythm, normal heart sounds and intact distal pulses.  Exam reveals no gallop and no friction rub.   No murmur heard. Pulmonary/Chest: Effort normal. No accessory muscle usage or stridor. Not tachypneic. No respiratory distress. He has no decreased breath sounds. He has no wheezes. He has no rhonchi. He has rales in the right lower field.  Abdominal: Soft. Bowel sounds are normal. He exhibits no distension and no mass. There is no tenderness. There is no rebound and no guarding.  Musculoskeletal: Normal range of motion. He exhibits no edema and no tenderness.  Lymphadenopathy:    He has no cervical adenopathy.  Neurological: He is oriented to person, place, and time.  Skin: Skin is warm and dry. No rash noted. He is not diaphoretic. No erythema. No pallor.  Psychiatric: He has a normal mood and affect. His behavior is normal. Judgment and thought content normal.     Lab Results  Component Value Date   WBC 6.2 08/16/2012   HGB 15.1 08/16/2012   HCT 45.3 08/16/2012   PLT 284.0 08/16/2012   GLUCOSE 94 08/16/2012  CHOL 208* 08/16/2012   TRIG 165.0* 08/16/2012   HDL 54.60 08/16/2012   LDLDIRECT 129.0 08/16/2012   LDLCALC 98 12/15/2011    ALT 22 08/16/2012   AST 20 08/16/2012   NA 140 08/16/2012   K 4.3 08/16/2012   CL 105 08/16/2012   CREATININE 1.1 08/16/2012   BUN 14 08/16/2012   CO2 27 08/16/2012   TSH 1.16 11/15/2010   PSA 1.14 08/16/2012   HGBA1C 6.1 08/16/2012       Assessment & Plan:

## 2013-10-02 NOTE — Patient Instructions (Signed)

## 2013-10-02 NOTE — Assessment & Plan Note (Signed)
I will check his A1C to see if he has developed DM2 

## 2013-10-02 NOTE — Progress Notes (Signed)
Pre visit review using our clinic review tool, if applicable. No additional management support is needed unless otherwise documented below in the visit note. 

## 2013-10-02 NOTE — Assessment & Plan Note (Signed)
His EKG and CXR are normal His labs and cardiac enzymes are all normal I don't think this pain is cardiac but when he gets better I have asked him to have an ETT done to see if there is concerned for CAD

## 2013-10-02 NOTE — Assessment & Plan Note (Signed)
I will treat the infection with avelox and he will take a cough suppressant

## 2013-10-04 ENCOUNTER — Telehealth: Payer: Self-pay | Admitting: Internal Medicine

## 2013-10-04 NOTE — Telephone Encounter (Signed)
He has had recurrent episodes of chest pain The ETT gives a lot more info about heart disease than just the EKG

## 2013-10-04 NOTE — Telephone Encounter (Signed)
Pt  Scheduled for exercise tolerance test.  For 10-21-2012@2 :00 pm - Diagnosis: Other chest pain. I called to give appt time to pt  Wife wants to know why he has to have an exercise tolerance test if his ekg was normal . PLEASE ADVISE -Pt  Wife refusing appt .

## 2013-10-07 ENCOUNTER — Telehealth: Payer: Self-pay | Admitting: Internal Medicine

## 2013-10-07 NOTE — Telephone Encounter (Signed)
Form is on your desk. Patient is not in the lobby.

## 2013-10-07 NOTE — Telephone Encounter (Signed)
10/07/2013  Pt is in lobby stating that he spoke with someone this morning regarding having Dr. Linda Hedges filling out paperwork.  Pt would not drop off paperwork without speaking with Dr. Linda Hedges assistant.  Pt is sitting in lobby.  Please advise.

## 2013-10-09 ENCOUNTER — Ambulatory Visit: Payer: Self-pay | Admitting: Family Medicine

## 2013-10-09 ENCOUNTER — Ambulatory Visit: Payer: BC Managed Care – PPO

## 2013-10-10 ENCOUNTER — Telehealth: Payer: Self-pay

## 2013-10-10 NOTE — Telephone Encounter (Signed)
Received correspondence for patient's insurance stating Depo Testosterone 200 mg/ml is not approvable at this time since the indicated use of this medication does not meet the service benefit plans criteria for medical necessity. Information is in correspondence folder for Dr Linda Hedges

## 2013-10-16 ENCOUNTER — Ambulatory Visit: Payer: Self-pay

## 2013-10-17 ENCOUNTER — Telehealth: Payer: Self-pay | Admitting: Internal Medicine

## 2013-10-17 NOTE — Telephone Encounter (Signed)
Pt's wife called to say Mr [name] [name] was unaware he was referred until they got a automated reminder.  He isn't sure why he is being referred.  I don't see a designated party release to talk to the wife.

## 2013-10-21 ENCOUNTER — Encounter: Payer: BC Managed Care – PPO | Admitting: Nurse Practitioner

## 2013-10-30 ENCOUNTER — Encounter: Payer: Self-pay | Admitting: Internal Medicine

## 2013-10-30 ENCOUNTER — Encounter (HOSPITAL_COMMUNITY): Payer: Self-pay | Admitting: Psychiatry

## 2013-10-30 ENCOUNTER — Ambulatory Visit (INDEPENDENT_AMBULATORY_CARE_PROVIDER_SITE_OTHER): Payer: BC Managed Care – PPO | Admitting: Internal Medicine

## 2013-10-30 ENCOUNTER — Ambulatory Visit (INDEPENDENT_AMBULATORY_CARE_PROVIDER_SITE_OTHER): Payer: BC Managed Care – PPO | Admitting: Psychiatry

## 2013-10-30 VITALS — BP 136/79 | HR 72 | Ht 76.0 in | Wt 259.0 lb

## 2013-10-30 VITALS — BP 122/80 | HR 68 | Temp 98.0°F | Wt 260.0 lb

## 2013-10-30 DIAGNOSIS — N453 Epididymo-orchitis: Secondary | ICD-10-CM

## 2013-10-30 DIAGNOSIS — F332 Major depressive disorder, recurrent severe without psychotic features: Secondary | ICD-10-CM

## 2013-10-30 DIAGNOSIS — N451 Epididymitis: Secondary | ICD-10-CM | POA: Insufficient documentation

## 2013-10-30 DIAGNOSIS — F329 Major depressive disorder, single episode, unspecified: Secondary | ICD-10-CM

## 2013-10-30 MED ORDER — BUPROPION HCL ER (XL) 300 MG PO TB24: ORAL_TABLET | ORAL | Status: DC

## 2013-10-30 MED ORDER — FLUOXETINE HCL 40 MG PO CAPS: 40.0000 mg | ORAL_CAPSULE | Freq: Every day | ORAL | Status: DC

## 2013-10-30 MED ORDER — LEVOFLOXACIN 500 MG PO TABS: 500.0000 mg | ORAL_TABLET | Freq: Every day | ORAL | Status: DC

## 2013-10-30 NOTE — Progress Notes (Signed)
Pre visit review using our clinic review tool, if applicable. No additional management support is needed unless otherwise documented below in the visit note. 

## 2013-10-30 NOTE — Progress Notes (Signed)
[place] Progress Note  [name] [name] [id number] 62 y.o.  10/30/2013 8:51 AM  Chief Complaint:  Medication management and followup.  History of Present Illness:  [name] [name] came for his followup appointment.  He is compliant with Wellbutrin and Prozac.  He denies any side effects.  He has been sick around [employer] .  He was given antibiotic for the cough and he recently finished antibiotics.  He was disappointed because his testosterone was not approved by [employer].  He has noticed decreased sex drive, decreased energy and decreased motivation to do things.  He is sleeping more than usual.  He is complaining of testicle swelling and he is scheduled to see Dr. Linda Hedges at 11:30 today who is managing his hypogonadism.  Overall his depression is stable.  He is less irritable and less tearful.  He denies any suicidal thoughts or homicidal thought but remains isolated and withdrawn.  He denies any tremors or shakes.  He likes the combination of Prozac and Wellbutrin.  His appetite is okay .  He lost 3 pounds from his last visit.  He denied any suicidal thoughts or homicidal thoughts.  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  He has some concern about his job .  Patient has a blood work done.  His CBC, comprehensive metabolic panel and hemoglobin A1c is normal.  Suicidal Ideation: No Plan Formed: No Patient has means to carry out plan: No  Homicidal Ideation: No Plan Formed: No Patient has means to carry out plan: No  Review of Systems: Psychiatric: Agitation: No Hallucination: No Depressed Mood: Yes Insomnia: No Hypersomnia: Yes Altered Concentration: No Feels Worthless: No Grandiose Ideas: No Belief In Special Powers: No New/Increased Substance Abuse: No Compulsions: No  Neurologic: Headache: No Seizure: No Paresthesias: No  Past Medical History: Hypogonadism  Social history. Patient is employed and married.  He works third  shift a Insurance claims handler.  He has 3 children.  Outpatient Encounter Prescriptions as of 10/30/2013  Medication Sig  . buPROPion (WELLBUTRIN XL) 300 MG 24 hr tablet TAKE ONE TABLET BY MOUTH EVERY MORNING  . FLUoxetine (PROZAC) 40 MG capsule Take 1 capsule (40 mg total) by mouth daily.  . L-methylfolate Calcium 15 MG TABS TAKE ONE TABLET BY MOUTH ONE TIME DAILY   . [DISCONTINUED] buPROPion (WELLBUTRIN XL) 300 MG 24 hr tablet TAKE ONE TABLET BY MOUTH EVERY MORNING   . [DISCONTINUED] FLUoxetine (PROZAC) 40 MG capsule Take 1 capsule (40 mg total) by mouth daily.  Marland Kitchen HYDROcodone-homatropine (HYCODAN) 5-1.5 MG/5ML syrup Take 5 mLs by mouth every 8 (eight) hours as needed for cough.  . meloxicam (MOBIC) 15 MG tablet Take 1 tablet (15 mg total) by mouth daily.  Marland Kitchen moxifloxacin (AVELOX) 400 MG tablet Take 1 tablet (400 mg total) by mouth daily.  Marland Kitchen testosterone cypionate (DEPO-TESTOSTERONE) 200 MG/ML injection Inject 2 mLs (400 mg total) into the muscle every 21 ( twenty-one) days.    Past Psychiatric History/Hospitalization(s): Patient denies any history of suicidal attempt or any inpatient psychiatric treatment.  He denies any history of mania, psychosis, paranoia or any hallucination.  In the past he had tried Effexor, Abilify, Paxil, Cymbalta, Zoloft, amitriptyline, Pristiq, Britnellex, Celexa, Deplin and Vyvanse.  He is seen in this office since September 2009.  He has seen multiple psychiatrists in the past.  Anxiety: No Bipolar Disorder: No Depression: Yes Mania: No Psychosis: No Schizophrenia: No Personality Disorder: No Hospitalization for psychiatric illness: No History of  Electroconvulsive Shock Therapy: No Prior Suicide Attempts: No  Physical Exam: Constitutional:  BP 136/79  Pulse 72  Ht 6\' 4"  (1.93 m)  Wt 259 lb (117.482 kg)  BMI 31.54 kg/m2  General Appearance: alert, oriented, no acute distress, well nourished and casually dressed.   Musculoskeletal: Strength & Muscle Tone:  decreased Gait & Station: normal Patient leans: N/A  Psychiatric: Speech (describe rate, volume, coherence, spontaneity, and abnormalities if any): Clear and coherent at a rate rate and rhythm and normal volume  Thought Process (describe rate, content, abstract reasoning, and computation):  Slow however no flight of ideas or any loose association.    Associations: Intact and Some thought blocking  Thoughts: normal  Mental Status: Orientation: oriented to person, place, time/date and situation Mood & Affect: depressed affect Attention Span & Concentration: Intact  Medical Decision Making (Choose Three): Established Problem, Stable/Improving (1), Review of Psycho-Social Stressors (1), Review or order clinical lab tests (1), Review and summation of old records (2), Review of Last Therapy Session (1) and Review of New Medication or Change in Dosage (2)  Assessment: Axis I: Maj. depressive disorder, recurrent, severe  Axis II: Deferred  Axis III: Hypogonadism  Axis IV: Moderate  Axis V: 50   Plan:  I reviewed his records, blood results and his current medication.  Patient has noticed decreased energy and decreased sex strive since he is not taking testosterone which is not approved by his insurance company. Patient is scheduled to see Dr. Linda Hedges today and he will discussed with him to get prior authorization.  I will continue Wellbutrin and Paxil at present dose.  Followup in 3 months.  Time spent 25 minutes.  More than 50% of the time spent in psychoeducation, counseling and coordination of care.  Discuss safety plan that anytime having active suicidal thoughts or homicidal thoughts then patient need to call 911 or go to the local emergency room.  Ressie Slevin T., MD 10/30/2013

## 2013-10-30 NOTE — Progress Notes (Signed)
Subjective:    Patient ID: [name] [name], male    DOB: 1950-11-05, 63 y.o.   MRN: [id number]  HPI [name] [name] presents for swollen right testicle for the last 2 days. He has had no fever, no pain unless he manipulates the testicle. He has a history of epididymitis left in '10 and '13. No pain with urination. He has not had any trauma.  Past Medical History  Diagnosis Date  . Headache(784.0)   . Cardiac murmur     as a child  . Streptococcal meningitis     as an infant  . Depression   .  OSA (obstructive sleep apnea) 01/17/2011    npsg 2012:  AHI 67/hr. Auto titration 2012:  Optimal pressure 12cm.   Marland Kitchen HEADACHES, HX OF 02/18/2008    Qualifier: Diagnosis of  By: Danny Lawless CMA, [place]    . APPENDECTOMY, HX OF 02/18/2008    Qualifier: Diagnosis of  By: [place], [place]     Past Surgical History  Procedure Laterality Date  . Nasal sinus surgery      x 4 as a child  . Vasectomy    . Appendectomy  1967   Family History  Problem Relation Age of Onset  . Lung cancer Father    History   Social History  . Marital Status: Married    Spouse Name: N/A    Number of Children: Y  . Years of Education: N/A   Occupational History  . 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 Smoking status: Never Smoker   . Smokeless tobacco: Never Used  . Alcohol Use: No  . Drug Use: No  . Sexual Activity: Yes    Birth Control/ Protection: None   Other Topics Concern  . Not on file   Social History Narrative  . No narrative on file     Current Outpatient Prescriptions on File Prior to Visit  Medication Sig Dispense Refill  . buPROPion (WELLBUTRIN XL) 300 MG 24 hr tablet TAKE ONE TABLET BY MOUTH EVERY MORNING  90 tablet  0  . FLUoxetine (PROZAC) 40 MG capsule Take 1 capsule (40 mg total) by mouth daily.  90 capsule  0  . HYDROcodone-homatropine (HYCODAN) 5-1.5 MG/5ML syrup Take 5 mLs by mouth every 8 (eight) hours as needed for cough.  120 mL  0  .  L-methylfolate Calcium 15 MG TABS TAKE ONE TABLET BY MOUTH ONE TIME DAILY   90 tablet  0  . meloxicam (MOBIC) 15 MG tablet Take 1 tablet (15 mg total) by mouth daily.  30 tablet  0  . moxifloxacin (AVELOX) 400 MG tablet Take 1 tablet (400 mg total) by mouth daily.  10 tablet  0  . testosterone cypionate (DEPO-TESTOSTERONE) 200 MG/ML injection Inject 2 mLs (400 mg total) into the muscle every 21 ( twenty-one) days.  10 mL  0   No current facility-administered medications on file prior to visit.     Review of Systems System review is negative for any constitutional, cardiac, pulmonary, GI or neuro symptoms or complaints other than as described in the HPI.     Objective:   Physical Exam Filed Vitals:   10/30/13 1201  BP: 122/80  Pulse: 68  Temp: 98 F (36.7 C)   Gen'l - haggard appearing man in no acute distress Cor- RRR Pulm - normal respirations. Genitalia - right testicle normal size by palpation but enlarge at the epididymis with  marked tenderness. No mass in the scrotum.       Assessment & Plan:

## 2013-10-30 NOTE — Assessment & Plan Note (Signed)
Epididymitis - you have had this before on the left. No way to prevent this.  PLan   levqauin 500 mg once a day for 7 days  Elevated scrotum when possible, at other times good support with jockey shorts or jock-strap  When sitting with scrotum elevated with a towel apply cool pak/ice pak.  Alarm signs - fever, chills, worsening pain or increased swelling.

## 2013-10-30 NOTE — Patient Instructions (Signed)
Epididymitis - you have had this before on the left. No way to prevent this.  PLan   levqauin 500 mg once a day for 7 days  Elevated scrotum when possible, at other times good support with jockey shorts or jock-strap  When sitting with scrotum elevated with a towel apply cool pak/ice pak.  Alarm signs - fever, chills, worsening pain or increased swelling.    Epididymitis Epididymitis is a swelling (inflammation) of the epididymis. The epididymis is a cord-like structure along the back part of the testicle. Epididymitis is usually, but not always, caused by infection. This is usually a sudden problem beginning with chills, fever and pain behind the scrotum and in the testicle. There may be swelling and redness of the testicle. DIAGNOSIS  Physical examination will reveal a tender, swollen epididymis. Sometimes, cultures are obtained from the urine or from prostate secretions to help find out if there is an infection or if the cause is a different problem. Sometimes, blood work is performed to see if your white blood cell count is elevated and if a germ (bacterial) or viral infection is present. Using this knowledge, an appropriate medicine which kills germs (antibiotic) can be chosen by your caregiver. A viral infection causing epididymitis will most often go away (resolve) without treatment. HOME CARE INSTRUCTIONS   Hot sitz baths for 20 minutes, 4 times per day, may help relieve pain.  Only take over-the-counter or prescription medicines for pain, discomfort or fever as directed by your caregiver.  Take all medicines, including antibiotics, as directed. Take the antibiotics for the full prescribed length of time even if you are feeling better.  It is very important to keep all follow-up appointments. SEEK IMMEDIATE MEDICAL CARE IF:   You have a fever.  You have pain not relieved with medicines.  You have any worsening of your problems.  Your pain seems to come and go.  You develop  pain, redness, and swelling in the scrotum and surrounding areas. MAKE SURE YOU:   Understand these instructions.  Will watch your condition.  Will get help right away if you are not doing well or get worse. Document Released: 09/16/2000 Document Revised: 12/12/2011 Document Reviewed: 08/06/2009 [facility] Patient Information 2014 [place], [place].

## 2013-11-13 ENCOUNTER — Other Ambulatory Visit (HOSPITAL_COMMUNITY): Payer: Self-pay | Admitting: Psychiatry

## 2013-11-14 ENCOUNTER — Other Ambulatory Visit (HOSPITAL_COMMUNITY): Payer: Self-pay | Admitting: Psychiatry

## 2013-11-14 MED ORDER — L-METHYLFOLATE CALCIUM 15 MG PO TABS: ORAL_TABLET | ORAL | Status: DC

## 2014-01-29 ENCOUNTER — Encounter (HOSPITAL_COMMUNITY): Payer: Self-pay | Admitting: Psychiatry

## 2014-01-29 ENCOUNTER — Ambulatory Visit (INDEPENDENT_AMBULATORY_CARE_PROVIDER_SITE_OTHER): Payer: BC Managed Care – PPO | Admitting: Psychiatry

## 2014-01-29 VITALS — BP 119/66 | HR 65 | Ht 76.0 in | Wt 256.0 lb

## 2014-01-29 DIAGNOSIS — F332 Major depressive disorder, recurrent severe without psychotic features: Secondary | ICD-10-CM

## 2014-01-29 MED ORDER — CITALOPRAM HYDROBROMIDE 40 MG PO TABS: 40.0000 mg | ORAL_TABLET | Freq: Every day | ORAL | Status: DC

## 2014-01-29 MED ORDER — BUPROPION HCL ER (XL) 300 MG PO TB24: ORAL_TABLET | ORAL | Status: DC

## 2014-01-29 NOTE — Progress Notes (Addendum)
[place] [phone] Progress Note  [name] [name] [id number] 63 y.o.  01/29/2014 9:09 AM  Chief Complaint:  My medicine is not working.  I'm feeling more anxious and depressed.  History of Present Illness:  [name] [name] came for his followup appointment with his wife.  He is compliant with Wellbutrin and Prozac.  However he does not see any improvement with this medication.  His wife brings list of medication which includes Effexor, Cymbalta , Remeron , trazodone and Prestiq.  She wanted him to try these medication.  However he had tried Cymbalta Effexor and Prestiq in the past.  I mentioned that Remeron and trazodone will cause more sedation.  Patient already had excessive sedation and decreased energy.  Patient endorsed increased anxiety and nervousness during the day.  He is not getting his testosterone injection for hypogonadism .  He was seen by Adella Hare.  Patient told he was unable to get prior authorization for this office.  His wife is concerned about his lack of energy, social isolation, crying spells and decrease sex strive.  Patient and his wife like to retry Celexa which had helped him in the past.  Patient does not report any side effects.  He does no heavy tremors and shakes.  He has blood work in December 2014 which shows hemoglobin A1c 6.0.  His wife endorsed that his father recently deceased because of complications of Alzheimer disease.  The patient is sleeping on and off.  He gets some time 80 tearful.  He denies any suicidal thoughts and homicidal thoughts.  He denies any hallucination or any paranoia.  He admitted not eating well and he has lost 4 pounds from his last visit.  His blood pressure is normal.  He admitted sometimes the stress from the job. 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    Suicidal Ideation: No Plan Formed: No Patient has means to carry out plan: No  Homicidal Ideation: No Plan Formed: No Patient has means to carry out plan:  No  Review of Systems: Psychiatric: Agitation: No Hallucination: No Depressed Mood: Yes Insomnia: No Hypersomnia: Yes Altered Concentration: No Feels Worthless: No Grandiose Ideas: No Belief In Special Powers: No New/Increased Substance Abuse: No Compulsions: No  Neurologic: Headache: No Seizure: No Paresthesias: No  Past Medical History: Hypogonadism  Social history. Patient is employed and married.  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  Outpatient Encounter Prescriptions as of 01/29/2014  Medication Sig  . buPROPion (WELLBUTRIN XL) 300 MG 24 hr tablet TAKE ONE TABLET BY MOUTH EVERY MORNING  . citalopram (CELEXA) 40 MG tablet Take 1 tablet (40 mg total) by mouth daily.  Marland Kitchen HYDROcodone-homatropine (HYCODAN) 5-1.5 MG/5ML syrup Take 5 mLs by mouth every 8 (eight) hours as needed for cough.  . L-methylfolate Calcium 15 MG TABS TAKE ONE TABLET BY MOUTH ONE TIME DAILY  . levofloxacin (LEVAQUIN) 500 MG tablet Take 1 tablet (500 mg total) by mouth daily.  . meloxicam (MOBIC) 15 MG tablet Take 1 tablet (15 mg total) by mouth daily.  Marland Kitchen moxifloxacin (AVELOX) 400 MG tablet Take 1 tablet (400 mg total) by mouth daily.  Marland Kitchen testosterone cypionate (DEPO-TESTOSTERONE) 200 MG/ML injection Inject 2 mLs (400 mg total) into the muscle every 21 ( twenty-one) days.  . [DISCONTINUED] buPROPion (WELLBUTRIN XL) 300 MG 24 hr tablet TAKE ONE TABLET BY MOUTH EVERY MORNING  . [DISCONTINUED] FLUoxetine (PROZAC) 40 MG capsule Take 1 capsule (40 mg total)  by mouth daily.    Past Psychiatric History/Hospitalization(s): Patient denies any history of suicidal attempt or any inpatient psychiatric treatment.  He denies any history of mania, psychosis, paranoia or any hallucination.  In the past he had tried Effexor, Abilify, Paxil, Cymbalta, Zoloft, amitriptyline, Pristiq, Britnellex, Celexa, Deplin and Vyvanse.  He is seen in this office since September 2009.  He has seen multiple  psychiatrists in the past.  Anxiety: No Bipolar Disorder: No Depression: Yes Mania: No Psychosis: No Schizophrenia: No Personality Disorder: No Hospitalization for psychiatric illness: No History of Electroconvulsive Shock Therapy: No Prior Suicide Attempts: No  Physical Exam: Constitutional:  BP 119/66  Pulse 65  Ht 6\' 4"  (1.93 m)  Wt 256 lb (116.121 kg)  BMI 31.17 kg/m2  General Appearance: alert, oriented, no acute distress, well nourished and casually dressed.   Musculoskeletal: Strength & Muscle Tone: decreased Gait & Station: normal Patient leans: N/A   Mental Status Examination: Patient is casually dressed and poorly groomed.  His speech is slow with decreased volume and tone.  His thought process is slow but coherent.  He has sometimes thought blocking .  His affect is flat.  He denies any auditory or visual hallucination.  Denies any active or passive suicidal thoughts and homicidal thoughts.  There were no delusions or any paranoia.  His attention and concentration is poor .  There is delayed in his response to question .  He described his mood sad and depressed.  He is alert and oriented x3.  There were no tremors or shakes.  His psychomotor activity is decreased.  His fund of knowledge is average.  His insight judgment and impulse control is okay.  Established Problem, Stable/Improving (1), Review of Psycho-Social Stressors (1), Review or order clinical lab tests (1), Review and summation of old records (2), Review of Last Therapy Session (1) and Review of New Medication or Change in Dosage (2)  Assessment: Axis I: Maj. depressive disorder, recurrent, severe  Axis II: Deferred  Axis III: Hypogonadism  Axis IV: Moderate  Axis V: 50   Plan:  I reviewed his blood results and his current medication.  I also called his primary care physician's office .  His primary care physician is retired and he has no current primary care physician.  I was told that he should  call [facility] for his medical needs.  I discussed with him and his wife with other treatment options.  We discussed ECT but patient and his wife refused.  We also talked about TMX and patient and his wife willing to explore the option.  I would discontinue Prozac since patient does not see any improvement.  We will start Celexa 40 mg which has helped him in the past for  Longest period of time.  I will continue Wellbutrin at present dose.  I recommended to contact his primary care physician office to schedule appointment and to discuss other treatment option for his hypogonadism .  I will see him again in 4 weeks.  Discussed risks and benefits of medication.  Patient will call us back if he is interested in TMX.  Time spent 25 minutes.  More than 50% of the time spent in psychoeducation, counseling and coordination of care.  Discuss safety plan that anytime having active suicidal thoughts or homicidal thoughts then patient need to call 911 or go to the local emergency room.  Blakeleigh Domek T., MD 01/29/2014

## 2014-02-03 ENCOUNTER — Ambulatory Visit: Payer: Self-pay | Admitting: Internal Medicine

## 2014-02-03 DIAGNOSIS — Z0289 Encounter for other administrative examinations: Secondary | ICD-10-CM

## 2014-02-03 DIAGNOSIS — R4689 Other symptoms and signs involving appearance and behavior: Secondary | ICD-10-CM | POA: Insufficient documentation

## 2014-02-06 ENCOUNTER — Ambulatory Visit (INDEPENDENT_AMBULATORY_CARE_PROVIDER_SITE_OTHER): Payer: BC Managed Care – PPO | Admitting: Internal Medicine

## 2014-02-06 ENCOUNTER — Encounter: Payer: Self-pay | Admitting: Internal Medicine

## 2014-02-06 ENCOUNTER — Other Ambulatory Visit (INDEPENDENT_AMBULATORY_CARE_PROVIDER_SITE_OTHER): Payer: BC Managed Care – PPO

## 2014-02-06 ENCOUNTER — Other Ambulatory Visit: Payer: Self-pay | Admitting: Internal Medicine

## 2014-02-06 VITALS — BP 118/80 | HR 74 | Temp 97.4°F | Resp 13 | Wt 256.4 lb

## 2014-02-06 DIAGNOSIS — G4730 Sleep apnea, unspecified: Secondary | ICD-10-CM

## 2014-02-06 DIAGNOSIS — G4733 Obstructive sleep apnea (adult) (pediatric): Secondary | ICD-10-CM

## 2014-02-06 DIAGNOSIS — R5383 Other fatigue: Principal | ICD-10-CM

## 2014-02-06 DIAGNOSIS — R0789 Other chest pain: Secondary | ICD-10-CM

## 2014-02-06 DIAGNOSIS — R9431 Abnormal electrocardiogram [ECG] [EKG]: Secondary | ICD-10-CM

## 2014-02-06 DIAGNOSIS — R5381 Other malaise: Secondary | ICD-10-CM

## 2014-02-06 DIAGNOSIS — R6882 Decreased libido: Secondary | ICD-10-CM

## 2014-02-06 DIAGNOSIS — D649 Anemia, unspecified: Secondary | ICD-10-CM

## 2014-02-06 LAB — HEPATIC FUNCTION PANEL
ALT: 20 U/L (ref 0–53)
AST: 17 U/L (ref 0–37)
Albumin: 3.9 g/dL (ref 3.5–5.2)
Alkaline Phosphatase: 57 U/L (ref 39–117)
BILIRUBIN DIRECT: 0.1 mg/dL (ref 0.0–0.3)
BILIRUBIN TOTAL: 0.9 mg/dL (ref 0.2–1.2)
Total Protein: 7.1 g/dL (ref 6.0–8.3)

## 2014-02-06 LAB — CBC WITH DIFFERENTIAL/PLATELET
Basophils Absolute: 0 10*3/uL (ref 0.0–0.1)
Basophils Relative: 0.2 % (ref 0.0–3.0)
EOS PCT: 7.8 % — AB (ref 0.0–5.0)
Eosinophils Absolute: 0.4 10*3/uL (ref 0.0–0.7)
HEMATOCRIT: 37.5 % — AB (ref 39.0–52.0)
Hemoglobin: 12.6 g/dL — ABNORMAL LOW (ref 13.0–17.0)
LYMPHS ABS: 1.4 10*3/uL (ref 0.7–4.0)
Lymphocytes Relative: 27.9 % (ref 12.0–46.0)
MCHC: 33.6 g/dL (ref 30.0–36.0)
MCV: 91.4 fl (ref 78.0–100.0)
MONOS PCT: 7.4 % (ref 3.0–12.0)
Monocytes Absolute: 0.4 10*3/uL (ref 0.1–1.0)
NEUTROS PCT: 56.7 % (ref 43.0–77.0)
Neutro Abs: 2.8 10*3/uL (ref 1.4–7.7)
Platelets: 292 10*3/uL (ref 150.0–400.0)
RBC: 4.11 Mil/uL — ABNORMAL LOW (ref 4.22–5.81)
RDW: 13.4 % (ref 11.5–15.5)
WBC: 4.9 10*3/uL (ref 4.0–10.5)

## 2014-02-06 LAB — BASIC METABOLIC PANEL
BUN: 13 mg/dL (ref 6–23)
CO2: 29 mEq/L (ref 19–32)
Calcium: 9.3 mg/dL (ref 8.4–10.5)
Chloride: 107 mEq/L (ref 96–112)
Creatinine, Ser: 1.1 mg/dL (ref 0.4–1.5)
GFR: 72.44 mL/min (ref >60.00)
Glucose, Bld: 85 mg/dL (ref 70–99)
POTASSIUM: 3.8 meq/L (ref 3.5–5.1)
Sodium: 141 mEq/L (ref 135–145)

## 2014-02-06 LAB — TROPONIN I: Troponin I: <0.01 ng/mL (ref <0.06)

## 2014-02-06 LAB — TSH: TSH: 0.8 u[IU]/mL (ref 0.35–4.50)

## 2014-02-06 NOTE — Patient Instructions (Addendum)
Your next office appointment will be determined based upon review of your pending labs & x-rays. Those instructions will be transmitted to you through My Chart  OR  by mail;whichever process is your choice to receive results & recommendations . Take the EKG to all doctor  visits. There are nonspecific changes . If the old EKG is not available for comparison; it may result in unnecessary hospitalization for observation with significant unnecessary expense.

## 2014-02-06 NOTE — Progress Notes (Signed)
   Subjective:    Patient ID: [name] [name], male    DOB: 1951-02-18, 63 y.o.   MRN: [id number]  HPI  Over the past 6 months he has been having substernal chest pain which last hours & occuring only at work. It is described as a chest tightness which is nonradiating. It does seem to get BETTER with physical activity. He also feels as if he cannot get a deep enough breath.  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 He works @ night  The pain typically resolves after he gets home; this did not occur today as the pain persisted until he went to bed.  His Psychiatrist has proposed using transcranial magnetic stimulation.  He has a history of testosterone deficiency. Insurance disallowed the supplement as it was being prescribed to treat depression in addition to documented testosterone deficiency.Free testosterone was 22.4 in 11/13 ( nl > 47). He describes decreased libido & fatigue    Review of Systems  He denies unexplained weight loss, abdominal pain, significant dyspepsia, dysphagia, or melena. He has occasional rectal bleeding. No colonoscopy to date despite his knowledge of [place]. He denies dysuria, pyuria, hematuria, frequency, nocturia or polyuria. Intolerant to CPAP.      Objective:   Physical Exam  Significant or distinguishing  findings on physical exam include profoundly flat affect with masked facies. Splitting of the first heart sound. Increase in resistance in the extremities to ROM testing. Negative Homans. Gen.:  well-nourished in appearance. Cooperative throughout exam. Head: Normocephalic without obvious abnormalities Eyes: No corneal or conjunctival inflammation noted. Pupils equal round reactive to light and accommodation. Extraocular motion intact.Nose: External nasal exam reveals no deformity or inflammation. Nasal mucosa are pink and moist. No lesions or exudates noted.   Mouth: Oral mucosa and oropharynx reveal no lesions or exudates. Teeth in good  repair. Neck: No deformities, masses, or tenderness noted. Range of motion & Thyroid normal. Lungs: Normal respiratory effort; chest expands symmetrically. Lungs are clear to auscultation without rales, wheezes, or increased work of breathing. Heart: Normal rate and rhythm. Normal  S2. No gallop, click, or rub. No murmur. Abdomen: Bowel sounds normal; abdomen soft and nontender. No masses, organomegaly or hernias noted.                            Musculoskeletal/extremities: No deformity or scoliosis noted of  the thoracic or lumbar spine.  No clubbing, cyanosis, edema, or significant extremity  deformity noted. Range of motion normal .Tone & strength normal. Hand joints normal Fingernail  health good. Able to lie down & sit up w/o help. Negative SLR bilaterally Vascular: Carotid, radial artery, dorsalis pedis and  posterior tibial pulses are full and equal. No bruits present. Neurologic: Oriented x3. Deep tendon reflexes symmetrical and normal.  Gait normal   Skin: Intact without suspicious lesions or rashes. Lymph: No cervical, axillary lymphadenopathy present. Psych: as noted        Assessment & Plan:  #1 atypical chest pain; compared to 10/02/13 there's been loss of T voltage in leads 1, 2, 3, and aVF. The voltage is lower in V 4-6 as well.  #2 fatigue  #3 Decreased libido with past history of low testosterone  #4 sleep apnea, untreated  #5 intermittent rectal bleeding; no colonoscopy to date  See orders

## 2014-02-06 NOTE — Progress Notes (Signed)
Pre visit review using our clinic review tool, if applicable. No additional management support is needed unless otherwise documented below in the visit note. 

## 2014-02-07 ENCOUNTER — Encounter: Payer: Self-pay | Admitting: Internal Medicine

## 2014-02-07 ENCOUNTER — Telehealth: Payer: Self-pay | Admitting: Internal Medicine

## 2014-02-07 ENCOUNTER — Ambulatory Visit (INDEPENDENT_AMBULATORY_CARE_PROVIDER_SITE_OTHER): Payer: BC Managed Care – PPO | Admitting: Internal Medicine

## 2014-02-07 ENCOUNTER — Other Ambulatory Visit: Payer: Self-pay | Admitting: Internal Medicine

## 2014-02-07 VITALS — BP 112/68 | HR 64 | Ht 76.0 in | Wt 255.8 lb

## 2014-02-07 DIAGNOSIS — R0789 Other chest pain: Secondary | ICD-10-CM

## 2014-02-07 DIAGNOSIS — R079 Chest pain, unspecified: Secondary | ICD-10-CM

## 2014-02-07 DIAGNOSIS — R9431 Abnormal electrocardiogram [ECG] [EKG]: Secondary | ICD-10-CM

## 2014-02-07 LAB — D-DIMER, QUANTITATIVE: D-Dimer, Quant: 0.67 ug/mL-FEU — ABNORMAL HIGH (ref 0.00–0.48)

## 2014-02-07 MED ORDER — OMEPRAZOLE 20 MG PO CPDR: 20.0000 mg | DELAYED_RELEASE_CAPSULE | Freq: Every day | ORAL | Status: DC

## 2014-02-07 NOTE — Telephone Encounter (Signed)
Patient notified, letter generated. He states he has an appointment with cardiology today at 4 pm

## 2014-02-07 NOTE — Patient Instructions (Signed)
Your physician has recommended you make the following change in your medication:   BEGIN OMEPRAZOLE 20 MG BY MOUTH ONCE A DAY  Your physician recommends that you schedule a follow-up appointment AS NEEDED WITH DR ROSS.

## 2014-02-07 NOTE — Telephone Encounter (Signed)
Patient states that Dr. Linna Darner asked him not to work tonight. He states that he is scheduled to work for the next five nights and wants to know if Dr. Linna Darner wants him to be off for more than one night or not. Please advise.

## 2014-02-07 NOTE — Telephone Encounter (Signed)
Please remain out of work until 02/10/2014. Cardiology consultation requested as soon as possible.

## 2014-02-07 NOTE — Progress Notes (Signed)
HPI  Patinet is a 63 yo who was referred by [facility] for evaluatoin of CP and abnormal EKG The patient has no known history of CAD He has complaints of chest pressure (squeezing)  Occurs at work  He works at post [employer].  Lasts for period he is there  DOes not have at home Last weekend mowed the lawn (push)  Denied SOB  No CP Episodes at work are not associated with food intake  He denies problems swallowing things     No Known Allergies  Current Outpatient Prescriptions  Medication Sig Dispense Refill  . buPROPion (WELLBUTRIN XL) 300 MG 24 hr tablet TAKE ONE TABLET BY MOUTH EVERY MORNING  90 tablet  0  . citalopram (CELEXA) 40 MG tablet Take 1 tablet (40 mg total) by mouth daily.  30 tablet  0  . L-methylfolate Calcium 15 MG TABS TAKE ONE TABLET BY MOUTH ONE TIME DAILY  90 tablet  0   No current facility-administered medications for this visit.    Past Medical History  Diagnosis Date  . Headache(784.0)   . Cardiac murmur     as a child  . Streptococcal meningitis     as an infant  . Depression   .  OSA (obstructive sleep apnea) 01/17/2011    npsg 2012:  AHI 67/hr. Auto titration 2012:  Optimal pressure 12cm.   Marland Kitchen HEADACHES, HX OF 02/18/2008    Qualifier: Diagnosis of  By: Danny Lawless CMA, [place]    . APPENDECTOMY, HX OF 02/18/2008    Qualifier: Diagnosis of  By: [place], [place]      Past Surgical History  Procedure Laterality Date  . Nasal sinus surgery      x 4 as a child  . Vasectomy    . Appendectomy  1967    Family History  Problem Relation Age of Onset  . Lung cancer Father     History   Social History  . Marital Status: Married    Spouse Name: N/A    Number of Children: Y  . Years of Education: N/A   Occupational History  . 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 Smoking status: Never Smoker   . Smokeless tobacco: Never Used  . Alcohol Use: No  . Drug Use: No  . Sexual Activity: Yes    Birth Control/ Protection: None    Other Topics Concern  . Not on file   Social History Narrative  . No narrative on file    Review of Systems:  All systems reviewed.  They are negative to the above problem except as previously stated.  Vital Signs: BP 112/68  Pulse 64  Ht 6\' 4"  (1.93 m)  Wt 255 lb 12.8 oz (116.03 kg)  BMI 31.15 kg/m2  Physical Exam  HEENT:  Normocephalic, atraumatic. EOMI, PERRLA.  Neck: JVP is normal.  No bruits.  Lungs: clear to auscultation. No rales no wheezes.  Heart: Regular rate and rhythm. Normal S1, S2. No S3.   No significant murmurs. PMI not displaced.  Abdomen:  Supple, nontender. Normal bowel sounds. No masses. No hepatomegaly.  Extremities:   Good distal pulses throughout. No lower extremity edema.  Musculoskeletal :moving all extremities.  Neuro:   alert and oriented x3.  CN II-XII grossly intact.  EKG  SR 60  Nonspecific ST T wave changes    Assessment and Plan:  1. CP  It does not appear to be cardiac  in origin.  Occurs at work  He admits to increased stress but it is very predictible  Has not had at home.  Not with vigorous physcial activity Had EKG yesterday that did show some t wave flattening that was nonspecific  I would recomm treating for reflux.  If this does not help, i would recomm trying another position at work.  Will not sched definite f/u

## 2014-02-10 ENCOUNTER — Encounter (HOSPITAL_COMMUNITY): Payer: Self-pay | Admitting: Psychiatry

## 2014-02-10 ENCOUNTER — Ambulatory Visit (INDEPENDENT_AMBULATORY_CARE_PROVIDER_SITE_OTHER): Payer: BC Managed Care – PPO | Admitting: Psychiatry

## 2014-02-10 ENCOUNTER — Other Ambulatory Visit (HOSPITAL_COMMUNITY): Payer: Self-pay | Admitting: Psychiatry

## 2014-02-10 ENCOUNTER — Telehealth: Payer: Self-pay | Admitting: *Deleted

## 2014-02-10 DIAGNOSIS — F988 Other specified behavioral and emotional disorders with onset usually occurring in childhood and adolescence: Secondary | ICD-10-CM

## 2014-02-10 DIAGNOSIS — F332 Major depressive disorder, recurrent severe without psychotic features: Secondary | ICD-10-CM | POA: Insufficient documentation

## 2014-02-10 NOTE — Progress Notes (Signed)
Psychiatric Assessment Adult  Patient Identification:  [name] [name] Date of Evaluation:  02/10/2014 Chief Complaint: [name] [name] Consult  History of Chief Complaint:  No chief complaint on file.   HPI Patient is a 63 year old Caucasian male, with history of depression since early twenties. The medications he has used in the past: lithium, abilify, prozac, paxil, brintellix, Pristiq. He's currently on bupropion XL 300 mg and  celexa 40 mg. He has never been hospitalized for psychiatric reasons. No family history of depression. He reports hypersomnia and appetite fluctuates. Patient denies psychological (e.g no schizophrenia) or neurological conditions (e.g seizures, or CVA) He denies metal objects, no bullets, no fragments, no defibrillators, or pacemakers, no tumors, or any medical conditions that would preclude him from [place]. Medical history of OSA in the past. He lost weight and OSA disappeared. Beck score is 41, which is extreme depression, and 22 on PHQ9, which is severe depression. He denies SI/HI/AVH. Depression has affected every domain in his life. He currently works in [employer], and was a former Company secretary. He has tried every class of medications for depression, without relief. He is looking forward to a new alternative treatment option.  Review of Systems Physical Exam  Depressive Symptoms: depressed mood, anhedonia, hypersomnia, psychomotor retardation, fatigue, feelings of worthlessness/guilt, difficulty concentrating, hopelessness, impaired memory, anxiety, hypersomnia, loss of energy/fatigue, disturbed sleep, weight loss, decreased appetite,  (Hypo) Manic Symptoms:   Elevated Mood:  No Irritable Mood:  Yes Grandiosity:  No Distractibility:  Yes Labiality of Mood:  No Delusions:  No Hallucinations:  No Impulsivity:  No Sexually Inappropriate Behavior:  No Financial Extravagance:  No Flight of Ideas:  No  Anxiety Symptoms: Excessive Worry:  Yes Panic Symptoms:   No Agoraphobia:  No Obsessive Compulsive: No  Symptoms: None, Specific Phobias:  No Social Anxiety:  Yes  Psychotic Symptoms:  Hallucinations: No None Delusions:  No Paranoia:  No   Ideas of Reference:  No  PTSD Symptoms: Ever had a traumatic exposure:  No Had a traumatic exposure in the last month:  No Re-experiencing: No None Hypervigilance:  No Hyperarousal: No None Avoidance: No None  Traumatic Brain Injury: No   Past Psychiatric History: Diagnosis: MDD, recurrent, severe   Hospitalizations: never   Outpatient Care: yes   Substance Abuse Care: yes   Self-Mutilation: none   Suicidal Attempts: none   Violent Behaviors: none    Past Medical History:   Past Medical History  Diagnosis Date  . Headache(784.0)   . Cardiac murmur     as a child  . Streptococcal meningitis     as an infant  . Depression   .  OSA (obstructive sleep apnea) 01/17/2011    npsg 2012:  AHI 67/hr. Auto titration 2012:  Optimal pressure 12cm.   Marland Kitchen HEADACHES, HX OF 02/18/2008    Qualifier: Diagnosis of  By: Danny Lawless CMA, [place]    . APPENDECTOMY, HX OF 02/18/2008    Qualifier: Diagnosis of  By: Danny Lawless CMA, [place]     History of Loss of Consciousness:  No Seizure History:  No Cardiac History:  Yes Heart Murmur  Allergies:  No Known Allergies Current Medications:  Current Outpatient Prescriptions  Medication Sig Dispense Refill  . buPROPion (WELLBUTRIN XL) 300 MG 24 hr tablet TAKE ONE TABLET BY MOUTH EVERY MORNING  90 tablet  0  . citalopram (CELEXA) 40 MG tablet Take 1 tablet (40 mg total) by mouth daily.  30 tablet  0  . L-methylfolate Calcium 15 MG  TABS TAKE ONE TABLET BY MOUTH ONE TIME DAILY  90 tablet  0  . omeprazole (PRILOSEC) 20 MG capsule Take 1 capsule (20 mg total) by mouth daily.  30 capsule  11   No current facility-administered medications for this visit.    Previous Psychotropic Medications:  Medication Dose   celexa   40 mg    prozac   40 mg    lexapro   20 mg     brintellix  20 mg   pristiq           Substance Abuse History in the last 12 months: none  Substance Age of 1st Use Last Use Amount Specific Type  Nicotine      Alcohol      Cannabis      Opiates      Cocaine      Methamphetamines      LSD      Ecstasy      Benzodiazepines      Caffeine      Inhalants      Others:                         Social History: Current Place of Residence: [place] Place of Birth: [place]  Family Members: lives with wife, and daughter, 37  Marital Status:  Married Children:3  Sons: 29  Daughters: 29, 43  Relationships: yes  Education:  Dentist Problems/Performance: regular Religious Beliefs/Practices: christian  History of Abuse: none Occupational Experiences: yes, 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 Lung cancer Father     Mental Status Examination/Evaluation: Objective:  Appearance: Casual and Guarded  Eye Contact::  Fair  Speech:  Slow  Volume:  Decreased  Mood:  Dysphoric   Affect:  Congruent, Depressed, Flat and Inappropriate  Thought Process:  Goal Directed, Linear and Logical  Orientation:  Full (Time, Place, and Person)  Thought Content:  Obsessions and Rumination  Suicidal Thoughts:  No  Homicidal Thoughts:  No  Judgement:  Fair  Insight:  Fair  Psychomotor Activity:  Psychomotor Retardation  Akathisia:  No  Handed:  Right  AIMS (if indicated):  No Abnormal Movement   Assets:  Leisure Time Physical Health Resilience Social Support    Laboratory/X-Ray Psychological Evaluation(s)   NA  Dr. Dwyane Dee, MD/Radhika Dershem Kallie Edward, NP   Assessment:  Axis I: Major Depression, Recurrent severe  AXIS I Major Depression, Recurrent severe  AXIS II Deferred  AXIS III Past Medical History  Diagnosis Date  . Headache(784.0)   . Cardiac murmur     as a child  . Streptococcal  meningitis     as an infant  . Depression   .  OSA (obstructive sleep apnea) 01/17/2011    npsg 2012:  AHI 67/hr. Auto titration 2012:  Optimal pressure 12cm.   Marland Kitchen HEADACHES, HX OF 02/18/2008    Qualifier: Diagnosis of  By: Danny Lawless CMA, [place]    . APPENDECTOMY, HX OF 02/18/2008    Qualifier: Diagnosis of  By: [place], [place]       AXIS IV economic problems, educational problems, housing problems, occupational problems, other psychosocial or environmental problems, problems related to legal system/crime, problems related to social environment, problems with access to health care [name] and problems with primary support group  AXIS V 41-50 serious symptoms  Treatment Plan/Recommendations:Patient is a 63 year old Caucasian male, with history of depression since early twenties. The medications used: lithium, abilify, prozac, paxil, brintellix, Pristiq. He's currently on bupropion XL 300 mg and  celexa 40 mg. Never been hospitalized for psychiatric reasons. He reports having hypersomnia and appetite fluctuates. He has poor concentration, psychomotor retardation.  Patient denies psychological (e.g no schizophrenia) or neurological conditions (e.g seizures, or CVA) He denies metal objects, no bullets, no fragments, no defibrillators, or pacemakers, no tumors, or any medical conditions that would preclude him from [place]. Medical history of OSA in the past. He lost weight and OSA disappeared. Beck score is 41, which is extreme depression, and 22 on PHQ9, which is severe depression. He denies SI/HI/AVH. He is looking forward to a new treatment option and would benefit from [place]. Patient has severe depression and has used every class of medication, without relief. Discussed R/R/B/O of [place], with him and the the wife.  Plan of Care: MDD, recurrent, severe  Laboratory:  NA  Psychotherapy: in the past, couple of years, [employer] and [employer] banker, corporate; employee assistance program via [employer]   Medications:  Celexa 40 mg po QD; Bupropion XL 300 mg po QD  Routine PRN Medications:  No  Consultations: [place] consult  Safety Concerns:  None   Other:      Madison Hickman, NP 5/11/20151:04 PM

## 2014-02-10 NOTE — Telephone Encounter (Signed)
Office Message [place], [place] [address] p. [phone] f. [phone] ToShelba Flake Fax: [phone] From: [facility] Date/ Time: 02/06/2014 8:52 PM Taken By: Geradine Girt, RN Caller: [place]: Randell Loop Patient: [name] [name], [name] [name] DOB: [birth year] Phone: [phone] Reason for Call: Janett Billow is calling from [facility] regarding a STAT Troponin I ordered on [name] by W. Hopper5/04/2014 3:12:00 PM. The results were Troponin I <0.01 No indication of mycardial injury. Also need source for D-dimer - not labeled as serum or plasma. Please call [phone] Option #2 for verification.

## 2014-02-17 ENCOUNTER — Other Ambulatory Visit (HOSPITAL_COMMUNITY): Payer: Self-pay | Admitting: *Deleted

## 2014-02-17 DIAGNOSIS — F339 Major depressive disorder, recurrent, unspecified: Secondary | ICD-10-CM

## 2014-02-17 MED ORDER — L-METHYLFOLATE CALCIUM 15 MG PO TABS: ORAL_TABLET | ORAL | Status: DC

## 2014-02-26 ENCOUNTER — Ambulatory Visit (INDEPENDENT_AMBULATORY_CARE_PROVIDER_SITE_OTHER): Payer: BC Managed Care – PPO | Admitting: Psychiatry

## 2014-02-26 ENCOUNTER — Encounter (HOSPITAL_COMMUNITY): Payer: Self-pay | Admitting: Psychiatry

## 2014-02-26 VITALS — BP 118/68 | HR 75 | Ht 76.0 in | Wt 258.0 lb

## 2014-02-26 DIAGNOSIS — F332 Major depressive disorder, recurrent severe without psychotic features: Secondary | ICD-10-CM

## 2014-02-26 MED ORDER — CITALOPRAM HYDROBROMIDE 40 MG PO TABS: 40.0000 mg | ORAL_TABLET | Freq: Every day | ORAL | Status: DC

## 2014-02-26 NOTE — Progress Notes (Addendum)
[place] [phone] Progress Note  [name] [name] [id number] 63 y.o.  02/26/2014 9:48 AM  Chief Complaint:  I still feel depressed and anxious but Celexa is better than Prozac.    History of Present Illness:  [name] [name] came for his followup appointment.  On the last visit we started her on Celexa and discontinue Prozac.  He is taking Celexa without any side effects.  He feels some improvement in his mood however he remains overall anxious and depressed.  He was able to see Dr. Linna Darner however he was having chest pain and he did not discussed about testosterone refill.  Because of abnormal EKG he was recommended to see cardiologist.  Patient saw the cardiologist and found that his chest pain is not a cardiac origin.  The patient also consulted for TMX and now coordinator is trying to get approval from insurance.  Patient mentioned that his chest pain was severe but accidentally when he ran out from the [facility] folate, his chest pain resolved.  He still has some anxiety and occasional restlessness but the intensity of the chest pain has reduced.  Patient is hoping that he can get testosterone treatment from his primary care physician.  Patient denies any tremors or shakes.  He continued to endorse chronic feelings of hopelessness and worthlessness but denies any active or passive suicidal thoughts or homicidal thoughts.  He has a blood work which was done at Dr. [employer] office.  His appetite is okay.  His weight is stable.  His vitals are stable.  Patient admitted to occasional crying and tearfulness and remains isolated and withdrawn.  His energy level is low.  His attention and concentration is fair.  He endorsed hypersomnia .  Patient is very hopeful that he may get TMX treatment for the depression and also testosterone for hypogonadism.  Patient denies any hallucination or any paranoia.  Patient is not drinking or using any illegal substances.  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  He has a supportive wife.  Suicidal Ideation: No Plan Formed: No Patient has means to carry out plan: No  Homicidal Ideation: No Plan Formed: No Patient has means to carry out plan: No  Review of Systems: Psychiatric: Agitation: No Hallucination: No Depressed Mood: Yes Insomnia: No Hypersomnia: Yes Altered Concentration: No Feels Worthless: No Grandiose Ideas: No Belief In Special Powers: No New/Increased Substance Abuse: No Compulsions: No  Neurologic: Headache: No Seizure: No Paresthesias: No  Past Medical History: Hypogonadism  Social history. Patient is employed and married.  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  Outpatient Encounter Prescriptions as of 02/26/2014  Medication Sig  . buPROPion (WELLBUTRIN XL) 300 MG 24 hr tablet TAKE ONE TABLET BY MOUTH EVERY MORNING  . citalopram (CELEXA) 40 MG tablet Take 1 tablet (40 mg total) by mouth daily.  Marland Kitchen omeprazole (PRILOSEC) 20 MG capsule Take 1 capsule (20 mg total) by mouth daily.  . [DISCONTINUED] citalopram (CELEXA) 40 MG tablet Take 1 tablet (40 mg total) by mouth daily.  . [DISCONTINUED] L-methylfolate Calcium 15 MG TABS TAKE ONE TABLET BY MOUTH ONE TIME DAILY    Past Psychiatric History/Hospitalization(s): Patient denies any history of suicidal attempt or any inpatient psychiatric treatment.  He denies any history of mania, psychosis, paranoia or any hallucination.  In the past he had tried Effexor, Abilify, Paxil, Cymbalta, Zoloft, amitriptyline, Pristiq, Britnellex, Celexa, Deplin and Vyvanse.  He is seen in this office since  September 2009.  He has seen multiple psychiatrists in the past.  Anxiety: No Bipolar Disorder: No Depression: Yes Mania: No Psychosis: No Schizophrenia: No Personality Disorder: No Hospitalization for psychiatric illness: No History of Electroconvulsive Shock Therapy: No Prior Suicide Attempts: No  Physical Exam: Constitutional:  BP 118/68  Pulse 75   Ht 6\' 4"  (1.93 m)  Wt 258 lb (117.028 kg)  BMI 31.42 kg/m2  Recent Results (from the past 2160 hour(s))  BASIC METABOLIC PANEL     Status: None   Collection Time    02/06/14  3:12 PM      Result Value Ref Range   Sodium 141  135 - 145 mEq/L   Potassium 3.8  3.5 - 5.1 mEq/L   Chloride 107  96 - 112 mEq/L   CO2 29  19 - 32 mEq/L   Glucose, Bld 85  70 - 99 mg/dL   BUN 13  6 - 23 mg/dL   Creatinine, Ser 1.1  0.4 - 1.5 mg/dL   Calcium 9.3  8.4 - 10.5 mg/dL   GFR 72.44  >60.00 mL/min  CBC WITH DIFFERENTIAL     Status: Abnormal   Collection Time    02/06/14  3:12 PM      Result Value Ref Range   WBC 4.9  4.0 - 10.5 K/uL   RBC 4.11 (*) 4.22 - 5.81 Mil/uL   Hemoglobin 12.6 (*) 13.0 - 17.0 g/dL   HCT 37.5 (*) 39.0 - 52.0 %   MCV 91.4  78.0 - 100.0 fl   MCHC 33.6  30.0 - 36.0 g/dL   RDW 13.4  11.5 - 15.5 %   Platelets 292.0  150.0 - 400.0 K/uL   Neutrophils Relative % 56.7  43.0 - 77.0 %   Lymphocytes Relative 27.9  12.0 - 46.0 %   Monocytes Relative 7.4  3.0 - 12.0 %   Eosinophils Relative 7.8 (*) 0.0 - 5.0 %   Basophils Relative 0.2  0.0 - 3.0 %   Neutro Abs 2.8  1.4 - 7.7 K/uL   Lymphs Abs 1.4  0.7 - 4.0 K/uL   Monocytes Absolute 0.4  0.1 - 1.0 K/uL   Eosinophils Absolute 0.4  0.0 - 0.7 K/uL   Basophils Absolute 0.0  0.0 - 0.1 K/uL  HEPATIC FUNCTION PANEL     Status: None   Collection Time    02/06/14  3:12 PM      Result Value Ref Range   Total Bilirubin 0.9  0.2 - 1.2 mg/dL   Bilirubin, Direct 0.1  0.0 - 0.3 mg/dL   Alkaline Phosphatase 57  39 - 117 U/L   AST 17  0 - 37 U/L   ALT 20  0 - 53 U/L   Total Protein 7.1  6.0 - 8.3 g/dL   Albumin 3.9  3.5 - 5.2 g/dL  TSH     Status: None   Collection Time    02/06/14  3:12 PM      Result Value Ref Range   TSH 0.80  0.35 - 4.50 uIU/mL  D-DIMER, QUANTITATIVE     Status: Abnormal   Collection Time    02/06/14  3:12 PM      Result Value Ref Range   D-Dimer, Quant 0.67 (*) 0.00 - 0.48 ug/mL-FEU   Comment: At the inhouse  established cutoff value of 0.48 ug/mL FEU, this     methology has been documented in the literature to have a sensitivity     and  negative predictive value of at least 98-99%.  The test result     should be correlated with an assessment of the clinical probability of     DVT/VTE.  TROPONIN I     Status: None   Collection Time    02/06/14  3:12 PM      Result Value Ref Range   Troponin I <0.01  <0.06 ng/mL   Comment: No indication of myocardial injury.    General Appearance: well nourished  Musculoskeletal: Strength & Muscle Tone: decreased Gait & Station: normal Patient leans: N/A   Mental Status Examination: Patient is casually dressed and poorly groomed.  His speech is slow with decreased volume and tone.  His thought process is slow but coherent.  He has sometimes thought blocking .  His affect is flat.  He denies any auditory or visual hallucination.  He denies any active or passive suicidal thoughts and homicidal thoughts.  There were no delusions or any paranoia.  His attention and concentration is poor .  There is delayed in his response to question .  He described his mood sad and depressed.  He is alert and oriented x3.  There were no tremors or shakes.  His psychomotor activity is decreased.  His fund of knowledge is average.  His insight judgment and impulse control is okay.  Established Problem, Stable/Improving (1), Review or order clinical lab tests (1), Review and summation of old records (2), Review of Last Therapy Session (1), Review of Medication Regimen & Side Effects (2) and Review of New Medication or Change in Dosage (2)  Assessment: Axis I: Maj. depressive disorder, recurrent, severe  Axis II: Deferred  Axis III: Hypogonadism  Axis IV: Moderate  Axis V: 50   Plan:  I reviewed his blood results and his current medication.  I would discontinue Methyl folate because he has noticed that his chest pain is improved since the accident he did not refill the  prescription.  I encouraged him to keep appointment with her primary care physician and to discuss about testosterone treatment for his hypogonadism .  We will followup on TMX treatment.  Continue Celexa and Wellbutrin at present dose.  Patient does not have any tremors or shakes.  I offer counseling at this time patient is willing to see a counselor if needed.  Patient was seen by nurse practitioner in this office for TMX consult.  We will followup on that.  Recommended to call us back if he has any question or any concern.  Followup in 6 weeks.  Time spent 25 minutes.  More than 50% of the time spent in psychoeducation, counseling and coordination of care.  Discuss safety plan that anytime having active suicidal thoughts or homicidal thoughts then patient need to call 911 or go to the local emergency room.  Rudolph Daoust T., MD 02/26/2014

## 2014-03-06 ENCOUNTER — Institutional Professional Consult (permissible substitution): Payer: Self-pay | Admitting: Cardiovascular Disease

## 2014-04-17 ENCOUNTER — Ambulatory Visit (HOSPITAL_COMMUNITY): Payer: Self-pay | Admitting: Psychiatry

## 2014-04-24 ENCOUNTER — Ambulatory Visit (INDEPENDENT_AMBULATORY_CARE_PROVIDER_SITE_OTHER): Payer: BC Managed Care – PPO | Admitting: Psychiatry

## 2014-04-24 ENCOUNTER — Encounter (HOSPITAL_COMMUNITY): Payer: Self-pay | Admitting: *Deleted

## 2014-04-24 ENCOUNTER — Encounter (HOSPITAL_COMMUNITY): Payer: Self-pay | Admitting: Psychiatry

## 2014-04-24 VITALS — BP 126/74 | HR 66 | Ht 76.0 in | Wt 264.4 lb

## 2014-04-24 DIAGNOSIS — F332 Major depressive disorder, recurrent severe without psychotic features: Secondary | ICD-10-CM

## 2014-04-24 MED ORDER — BUPROPION HCL ER (XL) 300 MG PO TB24: ORAL_TABLET | ORAL | Status: DC

## 2014-04-24 MED ORDER — CITALOPRAM HYDROBROMIDE 40 MG PO TABS: 40.0000 mg | ORAL_TABLET | Freq: Every day | ORAL | Status: DC

## 2014-04-24 NOTE — Progress Notes (Signed)
[facility] [phone] Progress Note  [name] [name] [id number] 63 y.o.  04/24/2014 3:33 PM  Chief Complaint:  I am not feeling well.  My brother died 2 weeks ago.      History of Present Illness:  [name] [name] came for his followup appointment with his wife.  He is not doing very well.  His brother was killed on July 11 in a motor vehicle accident.  Patient told he has history of drinking .  A few months ago his sister-in-law committed suicide and in Dec 07, 2022 patient's father died in [place].  The patient's wife mentioned that he is been very sad depressed and crying.  Last week he has to flew [place] to inform his mother about his brother's death .  Patient told it was a very difficult for him.  Lately he is also under a lot of stress because his brothers illegitimate son is causing issues for them.  He is hiding the will but is causing a lot of stress in the family members.  Patient is experiencing more sadness and crying.  He has difficulty driving and sometime he loses his attention and concentration.  He gets easily tearful and remembering about his past.  His wife notice that he is been more isolated withdrawn and depressed.  On his last visit he stopped the Methyl Folate which was causing chest pain.  He has no more chest pain.  He denies any shakes or tremors.  He is taking Celexa and Wellbutrin.  He denies any agitation, anger, mood swing.  He denies any suicidal thoughts or homicidal thoughts but endorsed feelings of hopelessness worthlessness and no desire to do anything.  His energy level is low.  He is going to work but sometime he is unable to process anything and he gets lost .  His wife is driving him to doctor's appointment.  Patient denies any paranoia or any sedation.  His insurance has not approved so far far TMX treatment.  His appetite is low.  His vitals are stable.   Patient is not drinking or using any illegal substances.  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  He has a supportive wife.  Suicidal Ideation: No Plan Formed: No Patient has means to carry out plan: No  Homicidal Ideation: No Plan Formed: No Patient has means to carry out plan: No  Review of Systems  Constitutional: Positive for malaise/fatigue.  Skin: Negative for itching and rash.  Neurological: Positive for weakness. Negative for headaches.  Psychiatric/Behavioral: Positive for depression. The patient is nervous/anxious and has insomnia.    Psychiatric: Agitation: No Hallucination: No Depressed Mood: Yes Insomnia: Yes Hypersomnia: Yes Altered Concentration: No Feels Worthless: Yes Grandiose Ideas: No Belief In Special Powers: No New/Increased Substance Abuse: No Compulsions: No  Neurologic: Headache: No Seizure: No Paresthesias: No  Past Medical History: Hypogonadism  Social history. Patient is employed and married.  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  Outpatient Encounter Prescriptions as of 04/24/2014  Medication Sig  . buPROPion (WELLBUTRIN XL) 300 MG 24 hr tablet TAKE ONE TABLET BY MOUTH EVERY MORNING  . citalopram (CELEXA) 40 MG tablet Take 1 tablet (40 mg total) by mouth daily.  Marland Kitchen omeprazole (PRILOSEC) 20 MG capsule Take 1 capsule (20 mg total) by mouth daily.  . [DISCONTINUED] buPROPion (WELLBUTRIN XL) 300 MG 24 hr tablet TAKE ONE TABLET BY MOUTH EVERY MORNING  . [DISCONTINUED] citalopram (CELEXA) 40 MG tablet Take  1 tablet (40 mg total) by mouth daily.    Past Psychiatric History/Hospitalization(s): Patient denies any history of suicidal attempt or any inpatient psychiatric treatment.  He denies any history of mania, psychosis, paranoia or any hallucination.  In the past he had tried Effexor, Abilify, Paxil, Cymbalta, Zoloft, amitriptyline, Pristiq, Britnellex, Celexa, Deplin and Vyvanse.  He is seen in this office since September 2009.  He has seen multiple psychiatrists in the past.  Anxiety: No Bipolar Disorder:  No Depression: Yes Mania: No Psychosis: No Schizophrenia: No Personality Disorder: No Hospitalization for psychiatric illness: No History of Electroconvulsive Shock Therapy: No Prior Suicide Attempts: No  Physical Exam: Constitutional:  BP 126/74  Pulse 66  Ht 6\' 4"  (1.93 m)  Wt 264 lb 6.4 oz (119.931 kg)  BMI 32.20 kg/m2  Recent Results (from the past 2160 hour(s))  BASIC METABOLIC PANEL     Status: None   Collection Time    02/06/14  3:12 PM      Result Value Ref Range   Sodium 141  135 - 145 mEq/L   Potassium 3.8  3.5 - 5.1 mEq/L   Chloride 107  96 - 112 mEq/L   CO2 29  19 - 32 mEq/L   Glucose, Bld 85  70 - 99 mg/dL   BUN 13  6 - 23 mg/dL   Creatinine, Ser 1.1  0.4 - 1.5 mg/dL   Calcium 9.3  8.4 - 10.5 mg/dL   GFR 72.44  >60.00 mL/min  CBC WITH DIFFERENTIAL     Status: Abnormal   Collection Time    02/06/14  3:12 PM      Result Value Ref Range   WBC 4.9  4.0 - 10.5 K/uL   RBC 4.11 (*) 4.22 - 5.81 Mil/uL   Hemoglobin 12.6 (*) 13.0 - 17.0 g/dL   HCT 37.5 (*) 39.0 - 52.0 %   MCV 91.4  78.0 - 100.0 fl   MCHC 33.6  30.0 - 36.0 g/dL   RDW 13.4  11.5 - 15.5 %   Platelets 292.0  150.0 - 400.0 K/uL   Neutrophils Relative % 56.7  43.0 - 77.0 %   Lymphocytes Relative 27.9  12.0 - 46.0 %   Monocytes Relative 7.4  3.0 - 12.0 %   Eosinophils Relative 7.8 (*) 0.0 - 5.0 %   Basophils Relative 0.2  0.0 - 3.0 %   Neutro Abs 2.8  1.4 - 7.7 K/uL   Lymphs Abs 1.4  0.7 - 4.0 K/uL   Monocytes Absolute 0.4  0.1 - 1.0 K/uL   Eosinophils Absolute 0.4  0.0 - 0.7 K/uL   Basophils Absolute 0.0  0.0 - 0.1 K/uL  HEPATIC FUNCTION PANEL     Status: None   Collection Time    02/06/14  3:12 PM      Result Value Ref Range   Total Bilirubin 0.9  0.2 - 1.2 mg/dL   Bilirubin, Direct 0.1  0.0 - 0.3 mg/dL   Alkaline Phosphatase 57  39 - 117 U/L   AST 17  0 - 37 U/L   ALT 20  0 - 53 U/L   Total Protein 7.1  6.0 - 8.3 g/dL   Albumin 3.9  3.5 - 5.2 g/dL  TSH     Status: None   Collection  Time    02/06/14  3:12 PM      Result Value Ref Range   TSH 0.80  0.35 - 4.50 uIU/mL  D-DIMER, QUANTITATIVE  Status: Abnormal   Collection Time    02/06/14  3:12 PM      Result Value Ref Range   D-Dimer, Quant 0.67 (*) 0.00 - 0.48 ug/mL-FEU   Comment: At the inhouse established cutoff value of 0.48 ug/mL FEU, this     methology has been documented in the literature to have a sensitivity     and negative predictive value of at least 98-99%.  The test result     should be correlated with an assessment of the clinical probability of     DVT/VTE.  TROPONIN I     Status: None   Collection Time    02/06/14  3:12 PM      Result Value Ref Range   Troponin I <0.01  <0.06 ng/mL   Comment: No indication of myocardial injury.    General Appearance: well nourished  Musculoskeletal: Strength & Muscle Tone: decreased Gait & Station: normal Patient leans: N/A   Mental Status Examination: Patient is casually dressed and poorly groomed.  His speech is slow with decreased volume and tone.  His thought process is slow but coherent.  He has sometimes thought blocking .  His affect is flat.  He denies any auditory or visual hallucination.  He denies any active or passive suicidal thoughts and homicidal thoughts.  There were no delusions or any paranoia.  His attention and concentration is poor .  There is delayed in his response to question .  He described his mood sad and depressed.  He is alert and oriented x3.  There were no tremors or shakes.  His psychomotor activity is decreased.  His fund of knowledge is average.  His insight judgment and impulse control is okay.  Established Problem, Stable/Improving (1), Review of Psycho-Social Stressors (1), New Problem, with no additional work-up planned (3), Review of Last Therapy Session (1), Review of Medication Regimen & Side Effects (2) and Review of New Medication or Change in Dosage (2)  Assessment: Axis I: Maj. depressive disorder, recurrent,  severe  Axis II: Deferred  Axis III: Hypogonadism  Axis IV: Moderate  Axis V: 50   Plan:  Reassurance given.  Recommended grief counseling and given the number of hospice.  I will also schedule appointment with a therapist in this office if he is unable to see hospice.  At this time patient is unable to function very well.  I will take him off from work for 2 weeks.  Recommended to keep his current medication is Wellbutrin and Celexa.  Patient does not have any side effects of medication.  We will continue to followup on TMX treatment which is in the process of insurance approval.  I will see him again in 2 weeks.  Recommended to call us back if he has any question or a concern.  Time spent 25 minutes.  More than 50% of the time spent in psychoeducation, counseling and coordination of care.  Discuss safety plan that anytime having active suicidal thoughts or homicidal thoughts then patient need to call 911 or go to the local emergency room.  Serenidy Waltz T., MD 04/24/2014

## 2014-04-29 NOTE — Progress Notes (Signed)
Patient ID: [name] [name], male   DOB: 1951-02-25, 63 y.o.   MRN: [id number] Writer reached out to [employer] to determine criteria for [facility] tx for MDD. BCBS[employer]P informed Probation officer that all [employer] [facility] claims base don medical necessity. Per policy, pt must be off all anti-depressant medication during course of [place]. Discussed this with pt's psychiatrist. Pt scheduled for follow up in this clinic on 05/23/14. Will reconsider Tenst[facility] as a tx option at that time.

## 2014-05-23 ENCOUNTER — Ambulatory Visit (HOSPITAL_COMMUNITY): Payer: Self-pay | Admitting: Psychiatry

## 2014-05-28 ENCOUNTER — Encounter (HOSPITAL_COMMUNITY): Payer: Self-pay | Admitting: *Deleted

## 2014-05-28 ENCOUNTER — Ambulatory Visit (INDEPENDENT_AMBULATORY_CARE_PROVIDER_SITE_OTHER): Payer: BC Managed Care – PPO | Admitting: Psychiatry

## 2014-05-28 ENCOUNTER — Encounter (HOSPITAL_COMMUNITY): Payer: Self-pay | Admitting: Psychiatry

## 2014-05-28 VITALS — BP 112/73 | HR 80 | Ht 76.0 in | Wt 258.4 lb

## 2014-05-28 DIAGNOSIS — F332 Major depressive disorder, recurrent severe without psychotic features: Secondary | ICD-10-CM

## 2014-05-28 MED ORDER — ARIPIPRAZOLE 5 MG PO TABS: ORAL_TABLET | ORAL | Status: DC

## 2014-05-28 NOTE — Progress Notes (Signed)
[name] [phone] Progress Note  [name] [id number] 63 y.o.  05/28/2014 9:27 AM  Chief Complaint:  I am not getting better.  I'm sleeping too much.        History of Present Illness:  [name] [name] came for his followup appointment.  He continued to endorse depression and lack of motivation.  He is sleeping too much.  On his last visit we took him off from the work for 2 weeks .  He started work and first week of August.  Patient reported he didn't some improvement and he was not working.  Patient works third shift .  Patient mentioned that he felt much better when he slept in the night and awake in the morning.  He is going to hospice counseling however he does not feel any improvement in his depression.  He has no motivation to do many things.  His energy level is very low.  His appetite is decreased and he has lost 6 pounds in 2 weeks.  He is very isolated, withdrawn and hopeless.  Though he denies any suicidal thoughts or homicidal thoughts but he reports lack of motivation, anhedonia and hopeless feeling.  He is taking his Celexa and Wellbutrin.  He has no side effects.  We had tried to get authorization for TMX however his insurance wants him to be off from antidepressant.  I have discussed with the patient and he has some concern coming off from his antidepressant.  We also talked about an intensive outpatient program but I do believe they shouldn't require more intense and high level of care.  I talk about ECT but patient is not interested in ECT and he is very scared with shock treatment.  She denies any paranoia or hallucinations.  Denies any agitation, anger or any mood swings.  He endorsed profound sadness, crying, worthless feeling.  He continues to have grief from the loss of his brother.  His daughter is getting married in September in [place].  He is hoping to attend the ceremony but he sometimes feels not motivated to do anything.  Patient does not drink or use any  illegal substances.  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  He lives with his wife who is very supportive.  Suicidal Ideation: No Plan Formed: No Patient has means to carry out plan: No  Homicidal Ideation: No Plan Formed: No Patient has means to carry out plan: No  Review of Systems  Constitutional: Positive for weight loss and malaise/fatigue.  Skin: Negative for itching and rash.  Neurological: Positive for weakness. Negative for headaches.  Psychiatric/Behavioral: Positive for depression. The patient is nervous/anxious and has insomnia.    Psychiatric: Agitation: No Hallucination: No Depressed Mood: Yes Insomnia: Yes Hypersomnia: Yes Altered Concentration: No Feels Worthless: Yes Grandiose Ideas: No Belief In Special Powers: No New/Increased Substance Abuse: No Compulsions: No  Neurologic: Headache: No Seizure: No Paresthesias: No  Past Medical History: Hypogonadism  Social history. Patient is employed and married.  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  Outpatient Encounter Prescriptions as of 05/28/2014  Medication Sig  . ARIPiprazole (ABILIFY) 5 MG tablet Take 1/2 tab for 1 week and than 1 tab daily  . buPROPion (WELLBUTRIN XL) 300 MG 24 hr tablet TAKE ONE TABLET BY MOUTH EVERY MORNING  . citalopram (CELEXA) 40 MG tablet Take 1 tablet (40 mg total) by mouth daily.  Marland Kitchen omeprazole (PRILOSEC) 20 MG capsule Take 1  capsule (20 mg total) by mouth daily.    Past Psychiatric History/Hospitalization(s): Patient denies any history of suicidal attempt or any inpatient psychiatric treatment.  He denies any history of mania, psychosis, paranoia or any hallucination.  In the past he had tried Effexor, Abilify, Paxil, Cymbalta, Zoloft, amitriptyline, Pristiq, Britnellex, Celexa, Deplin and Vyvanse.  He is seen in this office since September 2009.  He has seen multiple psychiatrists in the past.  Anxiety: No Bipolar Disorder:  No Depression: Yes Mania: No Psychosis: No Schizophrenia: No Personality Disorder: No Hospitalization for psychiatric illness: No History of Electroconvulsive Shock Therapy: No Prior Suicide Attempts: No  Physical Exam: Constitutional:  BP 112/73  Pulse 80  Ht 6\' 4"  (1.93 m)  Wt 258 lb 6.4 oz (117.209 kg)  BMI 31.47 kg/m2  No results found for this or any previous visit (from the past 2160 hour(s)).  General Appearance: well nourished  Musculoskeletal: Strength & Muscle Tone: decreased Gait & Station: normal Patient leans: N/A   Mental Status Examination: Patient is casually dressed and poorly groomed.  His speech is slow with decreased volume and tone.  His response to the question is slow.  His thought process is slow but coherent.  He has sometimes thought blocking .  His affect is flat.  He denies any auditory or visual hallucination.  He denies any active or passive suicidal thoughts and homicidal thoughts.  There were no delusions or any paranoia.  His attention and concentration is poor .   He described his mood sad and depressed.  He is alert and oriented x3.  There were no tremors or shakes.  His psychomotor activity is decreased.  His fund of knowledge is average.  His insight judgment and impulse control is okay.  Established Problem, Stable/Improving (1), Review of Psycho-Social Stressors (1), Review and summation of old records (2), Review of Last Therapy Session (1), Review of Medication Regimen & Side Effects (2) and Review of New Medication or Change in Dosage (2)  Assessment: Axis I: Maj. depressive disorder, recurrent, severe  Axis II: Deferred  Axis III: Hypogonadism  Axis IV: Moderate  Axis V: 50   Plan:  I had a long discussion with the patient about treatment options.  I reviewed his record.  I recommended to try low-dose Abilify to help with depression.  Patient has taken Abilify in the past but he do not remember the response.  I will add Abilify  5 mg half tablet for one week and then 1 tablet daily to augment Celexa and Wellbutrin.  We also discussed intensive outpatient program but I do believe patient need higher level of care and TMX treatment/ECT.  Patient is fearful about shock treatment .  We are going to contact his insurance will authorize TMX.  Recommended to see more often grief counselor from hospice.  Patient is also scheduled to see a therapist in this office however appointment is in the first week of November.  At this time patient does not have any side effects of medication.  Continue to monitor Approval for TMx treatment.  I explained the duration, risk and prognosis of TMX treatment.  I will see him again in 2 weeks. Recommended to call us back if he has any question or a concern.  Time spent 25 minutes.  More than 50% of the time spent in psychoeducation, counseling and coordination of care.  Discuss safety plan that anytime having active suicidal thoughts or homicidal thoughts then patient need to call 911 or  go to the local emergency room.  Maressa Apollo T., MD 05/28/2014

## 2014-05-28 NOTE — Progress Notes (Signed)
Patient ID: [name] [name], male   DOB: 04/14/1951, 63 y.o.   MRN: [id number] Pt's psychiatrist and writer discussed the possibility of Transcranial Magnetic Stimulation treatment for this pt to treat his severe chronic depression. Psychiatrist recommended pt receive this tx. Writer discussed this with pt, including criteria specified by pt's insurance. Pt stated he wants to move forward with TMS tx. Pt completed a PHQ-9 with a score of 22 (severe depression). Will reach out to pt's insurance for more information on coverage, and pending approval, will schedule [employer] determination.

## 2014-06-02 ENCOUNTER — Other Ambulatory Visit (HOSPITAL_COMMUNITY): Payer: Self-pay | Admitting: Psychiatry

## 2014-06-02 ENCOUNTER — Telehealth (HOSPITAL_COMMUNITY): Payer: Self-pay | Admitting: Psychiatry

## 2014-06-02 ENCOUNTER — Telehealth (HOSPITAL_COMMUNITY): Payer: Self-pay

## 2014-06-02 NOTE — Telephone Encounter (Signed)
I returned patient's phone call.  He took Abilify 5 mg and felt dizzy and having sweats.  He forgot that he was told to take half tablet.  He is very anxious and he does not want to try Abilify.  I explained psychotropic medication side effects and that efficacy time.  Patient does not want to try again Abilify.

## 2014-06-11 ENCOUNTER — Ambulatory Visit (INDEPENDENT_AMBULATORY_CARE_PROVIDER_SITE_OTHER): Payer: BC Managed Care – PPO | Admitting: Psychiatry

## 2014-06-11 ENCOUNTER — Encounter (HOSPITAL_COMMUNITY): Payer: Self-pay | Admitting: Psychiatry

## 2014-06-11 VITALS — BP 118/62 | HR 77 | Ht 76.0 in | Wt 264.4 lb

## 2014-06-11 DIAGNOSIS — F332 Major depressive disorder, recurrent severe without psychotic features: Secondary | ICD-10-CM

## 2014-06-11 NOTE — Progress Notes (Signed)
[facility] Progress Note  [name] [name] [id number] 63 y.o.  06/11/2014 1:50 PM  Chief Complaint:  Medication management and follow up.        History of Present Illness:  [name] [name] came for his followup appointment.  He tried Abilify however after taking one dose of Abilify he felt very dizzy and sick.  He stopped the Abilify.  However he is seeing a grief counselor regularly .  He is feeling better from counseling.  He is trying to get his schedule in the morning.  He does not like her shift .  We are still trying to get approval from his insurance for TMX treatment.  The patient is going next week [place] for his daughter's wedding .  He is taking his Celexa and Wellbutrin.  His appetite is improved from the past.  He is sleeping a few hours .  He continues to have chronic depression with lack of motivation and feeling of hopelessness and worthlessness.  However he denies any suicidal thoughts or homicidal thought.  He has no tremors or shakes.  He lost to continue his current medication.  He mentioned his crying spells are less intense and less frequent from the past.  His appetite is okay.  His vitals are stable. Patient does not drink or use any illegal substances.  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  He lives with his wife who is very supportive.  Suicidal Ideation: No Plan Formed: No Patient has means to carry out plan: No  Homicidal Ideation: No Plan Formed: No Patient has means to carry out plan: No  Review of Systems  Skin: Negative for itching and rash.  Neurological: Negative for headaches.  Psychiatric/Behavioral: Positive for depression. The patient is nervous/anxious.    Psychiatric: Agitation: No Hallucination: No Depressed Mood: Yes Insomnia: Yes Hypersomnia: Yes Altered Concentration: No Feels Worthless: No Grandiose Ideas: No Belief In Special Powers: No New/Increased Substance Abuse: No Compulsions:  No  Neurologic: Headache: No Seizure: No Paresthesias: No  Past Medical History: Hypogonadism  Social history. Patient is employed and married.  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  Outpatient Encounter Prescriptions as of 06/11/2014  Medication Sig  . buPROPion (WELLBUTRIN XL) 300 MG 24 hr tablet TAKE ONE TABLET BY MOUTH EVERY MORNING  . citalopram (CELEXA) 40 MG tablet Take 1 tablet (40 mg total) by mouth daily.  Marland Kitchen omeprazole (PRILOSEC) 20 MG capsule Take 1 capsule (20 mg total) by mouth daily.  . [DISCONTINUED] ARIPiprazole (ABILIFY) 5 MG tablet Take 1/2 tab for 1 week and than 1 tab daily    Past Psychiatric History/Hospitalization(s): Patient denies any history of suicidal attempt or any inpatient psychiatric treatment.  He denies any history of mania, psychosis, paranoia or any hallucination.  In the past he had tried Effexor, Abilify, Paxil, Cymbalta, Zoloft, amitriptyline, Pristiq, Britnellex, Celexa, Deplin and Vyvanse.  He is seen in this office since September 2009.  He has seen multiple psychiatrists in the past.  Anxiety: No Bipolar Disorder: No Depression: Yes Mania: No Psychosis: No Schizophrenia: No Personality Disorder: No Hospitalization for psychiatric illness: No History of Electroconvulsive Shock Therapy: No Prior Suicide Attempts: No  Physical Exam: Constitutional:  BP 118/62  Pulse 77  Ht 6\' 4"  (1.93 m)  Wt 264 lb 6.4 oz (119.931 kg)  BMI 32.20 kg/m2  No results found for this or any previous visit (from the past 2160 hour(s)).  General  Appearance: well nourished  Musculoskeletal: Strength & Muscle Tone: decreased Gait & Station: normal Patient leans: N/A   Mental Status Examination: Patient is casually dressed and poorly groomed.  His speech is slow with decreased volume and tone.  His response to the question is slow.  His thought process is slow but coherent.  He has sometimes thought blocking .  His affect is flat.   He denies any auditory or visual hallucination.  He denies any active or passive suicidal thoughts and homicidal thoughts.  There were no delusions or any paranoia.  His attention and concentration is poor .   He described his mood sad and depressed.  He is alert and oriented x3.  There were no tremors or shakes.  His psychomotor activity is decreased.  His fund of knowledge is average.  His insight judgment and impulse control is okay.  Established Problem, Stable/Improving (1), Review of Psycho-Social Stressors (1), Review of Last Therapy Session (1) and Review of Medication Regimen & Side Effects (2)  Assessment: Axis I: Maj. depressive disorder, recurrent, severe  Axis II: Deferred  Axis III: Hypogonadism  Axis IV: Moderate  Axis V: 50   Plan:  Recommended the continued grief counseling.  At this time he does not want to change his medication.  I will continue Celexa and Wellbutrin at present dose .  I will discontinue Abilify since patient has dizziness .  We will continue to followup on insurance to authorize TMX.  Recommended to see grief counselor from hospice.  Recommended to call us back if he has any question or any concern.  I will see him again in 3 months.  ARFEEN,SYED T., MD 06/11/2014

## 2014-06-24 ENCOUNTER — Telehealth (HOSPITAL_COMMUNITY): Payer: Self-pay | Admitting: *Deleted

## 2014-06-24 NOTE — Telephone Encounter (Signed)
Patient left VM:MD said he could write a letter for patient recomending he change from Night shift to Day shift. When could he do this?

## 2014-06-30 ENCOUNTER — Encounter (HOSPITAL_COMMUNITY): Payer: Self-pay | Admitting: *Deleted

## 2014-06-30 ENCOUNTER — Telehealth (HOSPITAL_COMMUNITY): Payer: Self-pay | Admitting: *Deleted

## 2014-06-30 NOTE — Telephone Encounter (Signed)
Spoke to [place] D[employer] to inquire if written correspondence sent by this writer several weeks ago had been received. This Probation officer had sent written correspondence to [facility] Claims department requesting written clarification on BCBS FEP billing criteria for Transcranial Magnetic Stimulation for MDD. Peggye Form was not able to locate this correspondence in the electronic system. Sherly spoke with supervisor who verified that coverage for this service is based on medical neciessity, and that no prior authorization is necessary for outpatient services. Peggye Form was unable to provide medical necessity criteria for this service.

## 2014-07-01 ENCOUNTER — Encounter (HOSPITAL_COMMUNITY): Payer: Self-pay | Admitting: *Deleted

## 2014-07-01 ENCOUNTER — Telehealth (HOSPITAL_COMMUNITY): Payer: Self-pay | Admitting: *Deleted

## 2014-07-01 NOTE — Telephone Encounter (Signed)
Contacted patient as requested in VM: Patient needs letter "To whom it may concern", requests the wording "due to a medical condition", requests letter head without Behavioral Health office information and requests letter be mailed to him at his home

## 2014-07-28 ENCOUNTER — Ambulatory Visit (INDEPENDENT_AMBULATORY_CARE_PROVIDER_SITE_OTHER): Payer: BC Managed Care – PPO | Admitting: Internal Medicine

## 2014-07-28 ENCOUNTER — Encounter: Payer: Self-pay | Admitting: Internal Medicine

## 2014-07-28 VITALS — BP 108/64 | HR 67 | Temp 98.0°F | Resp 14 | Ht 76.0 in | Wt 262.0 lb

## 2014-07-28 DIAGNOSIS — R0789 Other chest pain: Secondary | ICD-10-CM

## 2014-07-28 DIAGNOSIS — F332 Major depressive disorder, recurrent severe without psychotic features: Secondary | ICD-10-CM

## 2014-07-28 DIAGNOSIS — J349 Unspecified disorder of nose and nasal sinuses: Secondary | ICD-10-CM | POA: Insufficient documentation

## 2014-07-28 NOTE — Assessment & Plan Note (Signed)
Feel that this mood disorder is causing his chest discomfort and pain. He is not well controlled currently with celexa and wellbutrin. He continues to follow with psychiatry.

## 2014-07-28 NOTE — Progress Notes (Signed)
Pre visit review using our clinic review tool, if applicable. No additional management support is needed unless otherwise documented below in the visit note. 

## 2014-07-28 NOTE — Patient Instructions (Signed)
We have cleaned out your ears today and you should be hearing better now.   I do not think that nitrates would help with this chest pain. I do think that the depression and mood may be causing the pain and it will be really important to work on that.   You can try taking a tums or ibuprofen or tylenol for the pain when you get it to see what will help you with the pain.   Come back if you have more problems with your ears and keep your appointment to establish care with me.

## 2014-07-28 NOTE — Assessment & Plan Note (Signed)
He still struggles with sinus congestion. Ear lavage bilaterally completed today and cleared.

## 2014-07-28 NOTE — Assessment & Plan Note (Addendum)
Do not feel that this is cardiac in nature and do not feel that nitrates would help with the pain. Do feel that he needs to get his depression under better control. Omeprazole has not helped the pains. He will trial aspirin or tylenol for the pain if he needs to take something.

## 2014-07-28 NOTE — Progress Notes (Signed)
   Subjective:    Patient ID: [name] [name], male    DOB: [birth year], 63 y.o.   MRN: [id number]  HPI The patient is a 63 YO man who is coming in today for an acute visit for ear blockage. He has chronic problems with his sinuses but denies exacerbation of that right now. Denies fevers, chills. He does have some hearing loss from the blockage. No drainage from his ears. He is still having the chest discomfort he was seen for previously and most of this he associates with work. 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 His depression is not well controlled at this time and they are working on options to treat this better.   Review of Systems  Constitutional: Negative for fever, chills, activity change, appetite change and fatigue.  HENT: Positive for congestion, ear discharge, hearing loss and rhinorrhea. Negative for ear pain, facial swelling, sinus pressure, sneezing and sore throat.   Respiratory: Negative for cough, chest tightness, shortness of breath and wheezing.   Cardiovascular: Positive for chest pain. Negative for palpitations and leg swelling.  Gastrointestinal: Negative for abdominal pain, diarrhea, constipation and abdominal distention.      Objective:   Physical Exam  Constitutional: He appears well-developed and well-nourished. No distress.  Flat affect  HENT:  Head: Normocephalic and atraumatic.  Mouth/Throat: No oropharyngeal exudate.  Bilaterally ears blocked with dark colored wax.  Eyes: EOM are normal.  Neck: Normal range of motion.  Cardiovascular: Normal rate and regular rhythm.   Pulmonary/Chest: Effort normal and breath sounds normal.  Abdominal: Soft. Bowel sounds are normal.   Filed Vitals:   07/28/14 0922  BP: 108/64  Pulse: 67  Temp: 98 F (36.7 C)  TempSrc: Oral  Resp: 14  Height: 6\' 4"  (1.93 m)  Weight: 262 lb (118.842 kg)  SpO2: 96%     Assessment & Plan:  Ear lavage bilaterally performed.

## 2014-08-08 ENCOUNTER — Other Ambulatory Visit (HOSPITAL_COMMUNITY): Payer: Self-pay | Admitting: Psychiatry

## 2014-08-08 ENCOUNTER — Telehealth (HOSPITAL_COMMUNITY): Payer: Self-pay

## 2014-08-08 ENCOUNTER — Telehealth (HOSPITAL_COMMUNITY): Payer: Self-pay | Admitting: Psychiatry

## 2014-08-08 DIAGNOSIS — F332 Major depressive disorder, recurrent severe without psychotic features: Secondary | ICD-10-CM

## 2014-08-08 NOTE — Telephone Encounter (Signed)
Katrina, mom picked up prescription on 08/08/14  DL [id number]  dlo

## 2014-08-10 ENCOUNTER — Other Ambulatory Visit (HOSPITAL_COMMUNITY): Payer: Self-pay | Admitting: Psychiatry

## 2014-08-11 ENCOUNTER — Ambulatory Visit (INDEPENDENT_AMBULATORY_CARE_PROVIDER_SITE_OTHER): Payer: BC Managed Care – PPO | Admitting: Psychiatry

## 2014-08-11 DIAGNOSIS — F332 Major depressive disorder, recurrent severe without psychotic features: Secondary | ICD-10-CM

## 2014-08-11 NOTE — Telephone Encounter (Signed)
Received faxed refill requests for  Abilify 5 mg daily, Celexa 40 mg daily-#90, Bupropion XL 300 mg daily-#90. Per MD progress note 06/11/14 - Abilify discontinued due to side effects. Will fax note back to pharmacy. Other medications refilled

## 2014-08-13 NOTE — Progress Notes (Signed)
THERAPIST PROGRESS NOTE  Presenting Problem Chief Complaint: Depression  What are the main stressors in your life right now, how long? Pt. Reports multiple recent losses including his dog who died in October 27, 2013, sister-in-law who died in February 2355, His 61 year old father died in March 2015, April 2015 his daughter's boss died, brother died in Apr 26, 2014. Pt. Reports that he was very close to his brother and has significant anger and blames his brother for his death.  Previous mental health services Have you ever been treated for a mental health problem? Yes     Are you currently seeing a therapist or counselor, counselor's name? No   Have you ever had a mental health hospitalization, how many times, length of stay? No   Have you ever been treated with medication? Yes   Have you ever had suicidal thoughts or attempted suicide, when, how? No  Risk factors for Suicide Demographic factors:  Male Current mental status: Suicidal ideation Loss factors: Loss of significant relationship Historical factors: none Risk Reduction factors: Living with another person, especially a relative Clinical factors:  Depression:   Anhedonia Cognitive features that contribute to risk: Polarized thinking    SUICIDE RISK:  Mild:  Suicidal ideation of limited frequency, intensity, duration, and specificity.  There are no identifiable plans, no associated intent, mild dysphoria and related symptoms, good self-control (both objective and subjective assessment), few other risk factors, and identifiable protective factors, including available and accessible social support.  Social/family history Have you been married, how many times?  One time. Pt. Has been married for 38 years.  Do you have children?  Pt. Has 58 year old daughter, 52 year old daughter, and 80 year old son.   Who lives in your current household? Pt. Lives with his wife and 5 year old daughter  Military history: No    Family of  origin (childhood history)  Where did you grow up? Pt. Reports that he [place]"  Do you have siblings, step/half siblings, list names, relation, sex, age? Yes . Pt. Reports anger towards his brother who died 04/26/2014. Pt. Blames his brother for his death.  Are your parents separated/divorced, when and why? No   Are your parents alive? No   Social supports (personal and professional): Pt.'s wife is supportive  Education How many grades have you completed? high school diploma/GED Did you have any problems in school, what type? No  Medications prescribed for these problems? 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 Pt. Reports that the work environment is very tense.   Legal history None   Trauma/Abuse history: Have you ever been exposed to any form of abuse, what type? No   Have you ever been exposed to something traumatic, describe? No   Substance use None reported  Mental Status: General Appearance Brayton Mars:  Casual Eye Contact:  Good Motor Behavior:  Psychomotor Retardation Speech:   Blocked Level of Consciousness:  Lethargic Mood:  Depressed Affect:  Constricted and Depressed Anxiety Level:  moderate Thought Process:  Coherent Thought Content:  WNL Perception:  Normal Judgment:  Fair Insight:  Present Cognition:  WNL  Diagnosis AXIS I Depressive Disorder NOS  AXIS II No diagnosis  AXIS III Past Medical History  Diagnosis Date  . Headache(784.0)   . Cardiac murmur  as a child  . Streptococcal meningitis     as an infant  . Depression   .  OSA (obstructive sleep apnea) 01/17/2011    npsg 2012:  AHI 67/hr. Auto titration 2012:  Optimal pressure 12cm.   Marland Kitchen HEADACHES, HX OF 02/18/2008    Qualifier: Diagnosis of  By: Danny Lawless CMA, [place]    . APPENDECTOMY, HX OF 02/18/2008    Qualifier: Diagnosis of   By: Danny Lawless CMA, [place]      AXIS IV other psychosocial or environmental problems  AXIS V 51-60 moderate symptoms   Plan: Continue with CBT based therapy. Pt. To return in 2-4 weeks.   _________________________________________       Eloise Levels, Ph.D., [place], [place], [facility] 08/13/2014

## 2014-08-14 ENCOUNTER — Encounter (HOSPITAL_COMMUNITY): Payer: Self-pay | Admitting: Psychiatry

## 2014-08-14 ENCOUNTER — Ambulatory Visit (INDEPENDENT_AMBULATORY_CARE_PROVIDER_SITE_OTHER): Payer: BC Managed Care – PPO | Admitting: Psychiatry

## 2014-08-14 VITALS — BP 120/75 | HR 72 | Ht 76.0 in | Wt 257.8 lb

## 2014-08-14 DIAGNOSIS — F332 Major depressive disorder, recurrent severe without psychotic features: Secondary | ICD-10-CM

## 2014-08-14 MED ORDER — LURASIDONE HCL 20 MG PO TABS: 20.0000 mg | ORAL_TABLET | Freq: Two times a day (BID) | ORAL | Status: DC

## 2014-08-14 NOTE — Progress Notes (Signed)
[facility] Progress Note  [name] [name] [id number] 63 y.o.  08/14/2014 10:49 AM  Chief Complaint:  Medication management and follow up.        History of Present Illness:  [name] [name] came for his followup appointment.  He is complaining of increased depression and fatigue.  He had tried Abilify but it makes him more tired.  He decided not to pursue further TMX treatment because his insurance does not approve.  He is taking Celexa and Wellbutrin but he feels some time very depressed, sad with lack of energy and motivation.  He recently seen his primary care physician however there were no changes in his medication.  He used to get testosterone but his insurance does not approve.  Patient feels some time very tired.  He is sleeping on and off.  He is trying to get morning ships however he has not heard from his employed.  He started counseling and he wants to continue to see [name] [name].  His next appointment is before Thanksgiving.  Patient visited [place] to attend his daughter's sweating.  He had a good time.  However he gets very tired.  Patient denies any hallucination or any paranoia.  He denies any psychosis.  However he admitted some time crying spells and feeling of hopelessness and worthlessness.  He denies any active or passive suicidal thoughts or homicidal thought.  He is not drinking or using any illegal substances. His appetite is okay.  His vitals are stable.  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  He lives with his wife who is very supportive.  Suicidal Ideation: No Plan Formed: No Patient has means to carry out plan: No  Homicidal Ideation: No Plan Formed: No Patient has means to carry out plan: No  Review of Systems  Constitutional: Positive for malaise/fatigue.  Skin: Negative for itching and rash.  Neurological: Negative for headaches.  Psychiatric/Behavioral: Positive for depression. The patient is nervous/anxious.     Psychiatric: Agitation: No Hallucination: No Depressed Mood: Yes Insomnia: Yes Hypersomnia: Yes Altered Concentration: No Feels Worthless: Yes Grandiose Ideas: No Belief In Special Powers: No New/Increased Substance Abuse: No Compulsions: No  Neurologic: Headache: No Seizure: No Paresthesias: No  Past Medical History: Hypogonadism  Social history. Patient is employed and married.  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  Outpatient Encounter Prescriptions as of 08/14/2014  Medication Sig  . B Complex-C (B-COMPLEX WITH VITAMIN C) tablet Take 1 tablet by mouth daily.  Marland Kitchen buPROPion (WELLBUTRIN XL) 300 MG 24 hr tablet TAKE ONE TABLET BY MOUTH EVERY MORNING   . citalopram (CELEXA) 40 MG tablet TAKE ONE TABLET BY MOUTH ONE TIME DAILY   . Lurasidone HCl 20 MG TABS Take 1 tablet (20 mg total) by mouth 2 (two) times daily.  . Omega 3-6-9 Fatty Acids (OMEGA 3-6-9 PO) Take by mouth.  Marland Kitchen omeprazole (PRILOSEC) 20 MG capsule Take 1 capsule (20 mg total) by mouth daily.  . TURMERIC CURCUMIN PO Take by mouth.    Past Psychiatric History/Hospitalization(s): Patient denies any history of suicidal attempt or any inpatient psychiatric treatment.  He denies any history of mania, psychosis, paranoia or any hallucination.  In the past he had tried Effexor, Abilify, Paxil, Cymbalta, Zoloft, amitriptyline, Pristiq, Britnellex, Celexa, Deplin and Vyvanse.  He is seen in this office since September 2009.  He has seen multiple psychiatrists in the past.  Anxiety: No Bipolar Disorder: No Depression: Yes Mania:  No Psychosis: No Schizophrenia: No Personality Disorder: No Hospitalization for psychiatric illness: No History of Electroconvulsive Shock Therapy: No Prior Suicide Attempts: No  Physical Exam: Constitutional:  BP 120/75 mmHg  Pulse 72  Ht 6\' 4"  (1.93 m)  Wt 257 lb 12.8 oz (116.937 kg)  BMI 31.39 kg/m2  No results found for this or any previous visit (from the  past 2160 hour(s)).  General Appearance: well nourished  Musculoskeletal: Strength & Muscle Tone: decreased Gait & Station: normal Patient leans: N/A   Mental Status Examination: Patient is casually dressed and poorly groomed.  His speech is slow with decreased volume and tone.  His response to the question is slow.  His thought process is slow but coherent.  He has sometimes thought blocking .  His affect is flat.  He denies any auditory or visual hallucination.  He denies any active or passive suicidal thoughts and homicidal thoughts.  There were no delusions or any paranoia.  His attention and concentration is poor .   He described his mood sad and depressed.  He is alert and oriented x3.  There were no tremors or shakes.  His psychomotor activity is decreased.  His fund of knowledge is average.  His insight judgment and impulse control is okay.  Established Problem, Stable/Improving (1), Review of Psycho-Social Stressors (1), Established Problem, Worsening (2), Review of Last Therapy Session (1), Review of Medication Regimen & Side Effects (2) and Review of New Medication or Change in Dosage (2)  Assessment: Axis I: Maj. depressive disorder, recurrent, severe  Axis II: Deferred  Axis III: Hypogonadism  Axis IV: Moderate  Axis V: 50   Plan:  Patient is feeling more sad than usual.  His depression is getting worse.  I gave him option to increase Wellbutrin 450 but he wanted to try latuda which he had heard recently helps the depression.  He is hoping that new medication will help his depression.  I will start Latuda 20 mg twice a day and he will continue Celexa and Wellbutrin for now.  I discussed medication side effects especially metabolic syndrome, tremors and shakes.  I recommended to call us back if he has any question or if he feel worsening of the symptoms.  I encouraged him to keep appointment with [name] [name].  I will see him again in 3 weeks. I also believe his lack of  energy is because of low testosterone and he is not getting any testosterone treatment.  His insurance does not approve.  I encouraged to contact his primary care physician if there is any alternative treatment available.  He promised that he will call his primary care physician. Time spent 25 minutes.  More than 50% of the time spent in psychoeducation, counseling and coordination of care.  Discuss safety plan that anytime having active suicidal thoughts or homicidal thoughts then patient need to call 911 or go to the local emergency room.   Joyous Gleghorn T., MD 08/14/2014

## 2014-08-15 ENCOUNTER — Telehealth (HOSPITAL_COMMUNITY): Payer: Self-pay | Admitting: *Deleted

## 2014-08-15 NOTE — Telephone Encounter (Signed)
Patient's wife called stating they saw Dr. Adele Schilder and the medication he prescribed is too expensive. Pt started on Latuda. Cost estimated around $700 with insurance. Asking what can be done or is there an alternative. Explained that a message would be sent to Dr. Adele Schilder and someone would call back next week as the Dr. Adele Schilder has left for the day.

## 2014-08-19 NOTE — Telephone Encounter (Signed)
Contacted patient and informed him per Dr. Adele Schilder: As [name] [name] is too expensive, MD could order Geodon, which is similar, if pt would like. Patient states he would like to try Geodon.

## 2014-08-20 ENCOUNTER — Other Ambulatory Visit (HOSPITAL_COMMUNITY): Payer: Self-pay | Admitting: Psychiatry

## 2014-08-20 MED ORDER — ZIPRASIDONE HCL 20 MG PO CAPS: 20.0000 mg | ORAL_CAPSULE | Freq: Two times a day (BID) | ORAL | Status: DC

## 2014-08-21 ENCOUNTER — Other Ambulatory Visit (HOSPITAL_COMMUNITY): Payer: Self-pay | Admitting: Psychiatry

## 2014-08-21 ENCOUNTER — Telehealth (HOSPITAL_COMMUNITY): Payer: Self-pay | Admitting: *Deleted

## 2014-08-21 MED ORDER — LURASIDONE HCL 20 MG PO TABS: 20.0000 mg | ORAL_TABLET | Freq: Every day | ORAL | Status: DC

## 2014-08-21 NOTE — Telephone Encounter (Signed)
Pt's wife left VM: Received message from pharmacy that RX filled for generic Geodon as ordered by MD. They read about med on Drugs.com and it states that if taking Citalopram, pt should not take Geodon. Want to speak with MD

## 2014-08-21 NOTE — Telephone Encounter (Signed)
I returned patient's wife will call.  She is concerned about drug interaction of Geodon with Celexa.  She does not want to stop Celexa because patient had a good response with Celexa.  I recommended to try Latuda samples which we have given in the past but patient could not afford.  We have some samples available and we will see if Latuda helps.  If it does help then we will consider getting prior authorization or enroll inpatient assistant program.  Discussed medication side effects.  Recommended to discontinue Geodon.  Samples of Latuda 20 mg daily is given.  Recommended to call us back if he has any question, concern or if any side effects.

## 2014-08-25 ENCOUNTER — Telehealth (HOSPITAL_COMMUNITY): Payer: Self-pay | Admitting: *Deleted

## 2014-08-25 ENCOUNTER — Ambulatory Visit (INDEPENDENT_AMBULATORY_CARE_PROVIDER_SITE_OTHER): Payer: BC Managed Care – PPO | Admitting: Psychiatry

## 2014-08-25 DIAGNOSIS — F332 Major depressive disorder, recurrent severe without psychotic features: Secondary | ICD-10-CM

## 2014-08-25 NOTE — Telephone Encounter (Signed)
Was in office to see therapist- Patient stated he is having panic attacks on the way to work and requested medication for anxiety

## 2014-08-26 ENCOUNTER — Telehealth (HOSPITAL_COMMUNITY): Payer: Self-pay | Admitting: Psychiatry

## 2014-08-26 MED ORDER — LURASIDONE HCL 20 MG PO TABS: 20.0000 mg | ORAL_TABLET | Freq: Every day | ORAL | Status: DC

## 2014-08-26 NOTE — Progress Notes (Signed)
   THERAPIST PROGRESS NOTE  Session Time: 3:00-4:00  Participation Level: Minimal  Behavioral Response: CasualAlertDepressed  Type of Therapy: Individual Therapy  Treatment Goals addressed: Anxiety, Depression  Interventions: Solution Focused  Summary: [name] [name] is a 63 y.o. male who presents with major depressive disorder, panic disorder   Suicidal/Homicidal: Nowithout intent/plan  Therapist Response: Pt. Was joined in session with his wife [name]). Maggie presents as Pt.'s advocate. Pt. And wife focused on job dissatisfaction and being able to move to [place]. Distinguished situational and chemical depression. Pt. Appears to have primarily chemical type, aggravated by job disatisfaction with poor self-care  habits. Pt. Reported concern about panic attacks before work. Pt.'s wife shared concern about being latuda based on her research as indicated for bippolar disorder. Pt. Reported sleeping most of the day. Pt.'s wife reported high sugar consumption from candy and preference for bread and butter. Pt. Was encouraged to monitor sugar and simple carbohydrate consumption as they would contribute to lethargy. Pt.'s wife reported that Pt. Is uncomfortably anxious when he consumes caffeine. Pt. Was encouraged to incorporate 20-30 minutes of physical exercise into daily routine. Referral to triad yoga as for anxiety, depression, and lethargy.  Plan: Continue with solution focused based therapy. Return again in 3-4 weeks.  Diagnosis: Axis I: Depressive Disorder NOS    Axis II: No diagnosis    Nancie Neas, [facility] 08/26/2014

## 2014-08-26 NOTE — Telephone Encounter (Signed)
I returned phone call.  I spoke to his wife who mentioned that patient started seeing Eloise Levels and have panic attack but he is doing breathing exercises which is helping him.  His wife also mentioned that he is taking Latuda without any side effects.  I recommended to continue counseling and breathing exercise and give more time to [place].  We will defer any new medication at this time.  Patient has appointment in 2 weeks.  We will get more samples of Latuda.

## 2014-09-04 ENCOUNTER — Ambulatory Visit (INDEPENDENT_AMBULATORY_CARE_PROVIDER_SITE_OTHER): Payer: BC Managed Care – PPO | Admitting: Internal Medicine

## 2014-09-04 ENCOUNTER — Other Ambulatory Visit (INDEPENDENT_AMBULATORY_CARE_PROVIDER_SITE_OTHER): Payer: BC Managed Care – PPO

## 2014-09-04 ENCOUNTER — Encounter: Payer: Self-pay | Admitting: Internal Medicine

## 2014-09-04 VITALS — BP 122/84 | HR 61 | Temp 97.8°F | Resp 14 | Ht 76.0 in | Wt 259.8 lb

## 2014-09-04 DIAGNOSIS — R269 Unspecified abnormalities of gait and mobility: Secondary | ICD-10-CM

## 2014-09-04 DIAGNOSIS — F332 Major depressive disorder, recurrent severe without psychotic features: Secondary | ICD-10-CM

## 2014-09-04 DIAGNOSIS — E291 Testicular hypofunction: Secondary | ICD-10-CM

## 2014-09-04 DIAGNOSIS — R5383 Other fatigue: Secondary | ICD-10-CM

## 2014-09-04 LAB — FOLATE: Folate: 14.8 ng/mL (ref >5.9)

## 2014-09-04 LAB — VITAMIN B12: Vitamin B-12: 416 pg/mL (ref 211–911)

## 2014-09-04 NOTE — Progress Notes (Signed)
Pre visit review using our clinic review tool, if applicable. No additional management support is needed unless otherwise documented below in the visit note. 

## 2014-09-04 NOTE — Patient Instructions (Signed)
We will have you go down to the lab so that we can check on the testosterone and the vitamin levels that could be causing the slowness. We will also order an MRI scan of your brain so that we can make sure there are no changes. If there are no changes on the MRI scan we may end up sending you to a neurologist to see if they think he would get benefit from Parkinson's drugs.   We will call you back with those results. We will let you know if you need to go see the neurologist. We will see back in about 6 months as long as things are going well. If you have any new problems or questions please feel free to call the office.  Parkinson Disease Parkinson disease is a disorder of the central nervous system, which includes the brain and spinal cord. A person with this disease slowly loses the ability to completely control body movements. Within the brain, there is a group of nerve cells (basal ganglia) that help control movement. The basal ganglia are damaged and do not work properly in a person with Parkinson disease. In addition, the basal ganglia produce and use a brain chemical called dopamine. The dopamine chemical sends messages to other parts of the body to control and coordinate body movements. Dopamine levels are low in a person with Parkinson disease. If the dopamine levels are low, then the body does not receive the correct messages it needs to move normally.  CAUSES  The exact reason why the basal ganglia get damaged is not known. Some medical researchers have thought that infection, genes, environment, and certain medicines may contribute to the cause.  SYMPTOMS   An early symptom of Parkinson disease is often an uncontrolled shaking (tremor) of the hands. The tremor will often disappear when the affected hand is consciously used.  As the disease progresses, walking, talking, getting out of a chair, and new movements become more difficult.  Muscles get stiff and movements become  slower.  Balance and coordination become harder.  Depression, trouble swallowing, urinary problems, constipation, and sleep problems can occur.  Later in the disease, memory and thought processes may deteriorate. DIAGNOSIS  There are no specific tests to diagnose Parkinson disease. You may be referred to a neurologist for evaluation. Your caregiver will ask about your medical history, symptoms, and perform a physical exam. Blood tests and imaging tests of your brain may be performed to rule out other diseases. The imaging tests may include an MRI or a CT scan. TREATMENT  The goal of treatment is to relieve symptoms. Medicines may be prescribed once the symptoms become troublesome. Medicine will not stop the progression of the disease, but medicine can make movement and balance better and help control tremors. Speech and occupational therapy may also be prescribed. Sometimes, surgical treatment of the brain can be done in young people. HOME CARE INSTRUCTIONS  Get regular exercise and rest periods during the day to help prevent exhaustion and depression.  If getting dressed becomes difficult, replace buttons and zippers with Velcro and elastic on your clothing.  Take all medicine as directed by your caregiver.  Install grab bars or railings in your home to prevent falls.  Go to speech or occupational therapy as directed.  Keep all follow-up visits as directed by your caregiver. SEEK MEDICAL CARE IF:  Your symptoms are not controlled with your medicine.  You fall.  You have trouble swallowing or choke on your food. MAKE SURE  YOU:  Understand these instructions.  Will watch your condition.  Will get help right away if you are not doing well or get worse. Document Released: 09/16/2000 Document Revised: 01/14/2013 Document Reviewed: 10/19/2011 [facility] Patient Information 2015 [place], [place]. This information is not intended to replace advice given to you by your health care  provider. Make sure you discuss any questions you have with your health care provider.

## 2014-09-05 ENCOUNTER — Encounter (HOSPITAL_COMMUNITY): Payer: Self-pay | Admitting: Psychiatry

## 2014-09-05 ENCOUNTER — Ambulatory Visit (INDEPENDENT_AMBULATORY_CARE_PROVIDER_SITE_OTHER): Payer: BC Managed Care – PPO | Admitting: Psychiatry

## 2014-09-05 VITALS — BP 122/61 | HR 72 | Wt 261.0 lb

## 2014-09-05 DIAGNOSIS — F332 Major depressive disorder, recurrent severe without psychotic features: Secondary | ICD-10-CM

## 2014-09-05 LAB — TESTOSTERONE, FREE, TOTAL, SHBG
Sex Hormone Binding: 26 nmol/L (ref 13–71)
TESTOSTERONE FREE: 42.7 pg/mL — AB (ref 47.0–244.0)
TESTOSTERONE-% FREE: 2.2 % (ref 1.6–2.9)
TESTOSTERONE: 197 ng/dL — AB (ref 300–890)

## 2014-09-05 MED ORDER — LURASIDONE HCL 20 MG PO TABS: 20.0000 mg | ORAL_TABLET | Freq: Every day | ORAL | Status: DC

## 2014-09-05 NOTE — Progress Notes (Signed)
[name] [name] Progress Note  [name] [name] [id number] 63 y.o.  09/05/2014 9:27 AM  Chief Complaint:  Medication management and follow up.        History of Present Illness:  [name] [name] came for his followup appointment.  He is taking Latuda 20 mg and he is feeling better. He noticed improvement in his mood and energy but he is also very anxious and having panic attack at work. He recently seen his PCP and recommended MRI brain to r/o Parkinson Disease. He has noticed gait issues, tremors and generalized weakness in past 1-2 years and now getting worst. He is schedule to have MRI and lab test and will get appointment to see neurologist. He knows depression is common in parkinson. He likes latuda and also seeing jennifer brown for counseling. We discussed that latuda may worsening his PD symptoms but also recommended to wait until he gets the Neurology visit. He wants to continue current medication for now. He had a good thanksgiving. His affect is improved from past. He denies any side effects of medication. He is getting Testerone and sleeping better. His level of energy is slightly improved. His job remains stressful. He denies any crying spells, paranoia or anger. He is not drinking or using any illegal substances. His appetite is okay.  His vitals are stable.  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  He lives with his wife who is very supportive.  Suicidal Ideation: No Plan Formed: No Patient has means to carry out plan: No  Homicidal Ideation: No Plan Formed: No Patient has means to carry out plan: No  Review of Systems  Constitutional: Positive for malaise/fatigue.  Skin: Negative for itching and rash.  Neurological: Positive for tremors and weakness. Negative for headaches.       Unsteady gait.  Psychiatric/Behavioral: Positive for depression. The patient is nervous/anxious.    Psychiatric: Agitation: No Hallucination: No Depressed Mood:  Yes Insomnia: No Hypersomnia: Yes Altered Concentration: No Feels Worthless: No Grandiose Ideas: No Belief In Special Powers: No New/Increased Substance Abuse: No Compulsions: No  Neurologic: Headache: No Seizure: No Paresthesias: No  Past Medical History: Hypogonadism  Social history. Patient is employed and married.  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  Outpatient Encounter Prescriptions as of 09/05/2014  Medication Sig  . B Complex-C (B-COMPLEX WITH VITAMIN C) tablet Take 1 tablet by mouth daily.  Marland Kitchen buPROPion (WELLBUTRIN XL) 300 MG 24 hr tablet TAKE ONE TABLET BY MOUTH EVERY MORNING   . citalopram (CELEXA) 40 MG tablet TAKE ONE TABLET BY MOUTH ONE TIME DAILY   . Lurasidone HCl 20 MG TABS Take 1 tablet (20 mg total) by mouth daily after breakfast.  . Omega 3-6-9 Fatty Acids (OMEGA 3-6-9 PO) Take by mouth.  . TURMERIC CURCUMIN PO Take by mouth.  . [DISCONTINUED] Lurasidone HCl 20 MG TABS Take 1 tablet (20 mg total) by mouth daily after breakfast.    Past Psychiatric History/Hospitalization(s): Patient denies any history of suicidal attempt or any inpatient psychiatric treatment.  He denies any history of mania, psychosis, paranoia or any hallucination.  In the past he had tried Effexor, Abilify, Paxil, Cymbalta, Zoloft, amitriptyline, Pristiq, Britnellex, Celexa, Deplin and Vyvanse.  He is seen in this office since September 2009.  He has seen multiple psychiatrists in the past.  Anxiety: No Bipolar Disorder: No Depression: Yes Mania: No Psychosis: No Schizophrenia: No Personality Disorder: No Hospitalization for psychiatric illness:  No History of Electroconvulsive Shock Therapy: No Prior Suicide Attempts: No  Physical Exam: Constitutional:  BP 122/61 mmHg  Pulse 72  Wt 261 lb (118.389 kg)  Recent Results (from the past 2160 hour(s))  Testosterone, free, total     Status: Abnormal (Preliminary result)   Collection Time: 09/04/14 12:15 PM   Result Value Ref Range   Testosterone 197 (L) 300 - 890 ng/dL    Comment:           Tanner Stage       Male              Male               I              < 30 ng/dL        < 10 ng/dL               II             < 150 ng/dL       < 30 ng/dL               III            100-320 ng/dL     < 35 ng/dL               IV             200-970 ng/dL     15-40 ng/dL               V/Adult        300-890 ng/dL     10-70 ng/dL      Sex Hormone Binding  nmol/L   Testosterone, Free  pg/mL   Testosterone-% Free  %  B12     Status: None   Collection Time: 09/04/14 12:15 PM  Result Value Ref Range   Vitamin B-12 416 211 - 911 pg/mL  Folate     Status: None   Collection Time: 09/04/14 12:15 PM  Result Value Ref Range   Folate 14.8 >5.9 ng/mL    General Appearance: well nourished  Musculoskeletal: Strength & Muscle Tone: decreased Gait & Station: unsteady Patient leans: N/A   Mental Status Examination: Patient is casually dressed and fairly groomed.  His speech is slow with decreased volume and tone.  His response to the question is slow.  His thought process is slow but coherent.  He has sometimes thought blocking .  His affect is flat.  He denies any auditory or visual hallucination.  He denies any active or passive suicidal thoughts and homicidal thoughts.  There were no delusions or any paranoia.  His attention and concentration is fair. He described his mood anxious and his affect is improved from the past.  He is alert and oriented x3.  There were no tremors or shakes.  His psychomotor activity is decreased.  His fund of knowledge is average.  His insight judgment and impulse control is okay.  Established Problem, Stable/Improving (1), Review of Psycho-Social Stressors (1), New Problem, with no additional work-up planned (3), Review of Last Therapy Session (1), Review of Medication Regimen & Side Effects (2) and Review of New Medication or Change in Dosage (2)  Assessment: Axis I: Maj.  depressive disorder, recurrent, severe  Axis II: Deferred  Axis III: Hypogonadism   Plan:  Patient is taking Latuda 20 mg daily.  He has noticed improvement in his mood and his  affect however he is anxious because he may have Parkinson disease.  He is scheduled to have brain MRI , B-12 and other lab tests.  He is also see a neurologist however has not schedule the appointment yet.  He also feeling better since he started seeing Eloise Levels.  I had a long discussion with the patient about psychiatric medicine can cause worsening of the Parkinson.  However he has noticed improved in his mood with the latuda.  He is taking Latuda 20 mg samples since he cannot afford brand name medication.  At this time patient does not have any other side effects.  We will follow-up with closely with the blood test and MRI.  I recommended to call us back once he scheduled to see neurologist so we can collaborate treatment since some psychiatric medication may worsen Parkinson.  Patient understand and acknowledged.  We will continue Celexa 40 mg daily and Wellbutrin 300 mg daily for now.  Samples of Latuda 20 mg is given today.  Encouraged to keep appointment with Eloise Levels.  I will see him again in 6 weeks. Time spent 25 minutes.  More than 50% of the time spent in psychoeducation, counseling and coordination of care.  Discuss safety plan that anytime having active suicidal thoughts or homicidal thoughts then patient need to call 911 or go to the local emergency room.   ARFEEN,SYED T., MD 09/05/2014

## 2014-09-05 NOTE — Assessment & Plan Note (Signed)
Check free testosterone level. Not clearly related to his depression.

## 2014-09-05 NOTE — Progress Notes (Signed)
   Subjective:    Patient ID: [name] [name], male    DOB: 08-20-1951, 63 y.o.   MRN: [id number]  HPI Patient 63 year old man coming in today to follow-up on his mood. He has been seen by behavioral health and his medications have been changed several times since our last visit. It was suggested to him that his low testosterone levels in the past may have been related to his low moods now that he is not on testosterone replacement. He is still having difficulty with chest discomfort however has been associated with his panic disorder and hopefully as he does better with that he will have less problems. In addition he and his wife have both been noticing that he's been much more lethargic recently and having hard times initiating motion. There is history of Parkinson's and his family.  Review of Systems  Constitutional: Negative for fever, chills, activity change, appetite change and fatigue.  HENT: Negative for ear pain, facial swelling, sinus pressure, sneezing and sore throat.   Respiratory: Negative for cough, chest tightness, shortness of breath and wheezing.   Cardiovascular: Positive for chest pain. Negative for palpitations and leg swelling.  Gastrointestinal: Negative for abdominal pain, diarrhea, constipation and abdominal distention.  Neurological:       Slowness in initiating movements  Psychiatric/Behavioral: Positive for dysphoric mood and decreased concentration.      Objective:   Physical Exam  Constitutional: He appears well-developed and well-nourished. No distress.  Flat affect  HENT:  Head: Normocephalic and atraumatic.  Mouth/Throat: No oropharyngeal exudate.  Difficult to stay with her masked facies versus depressed affect  Eyes: EOM are normal.  Neck: Normal range of motion.  Cardiovascular: Normal rate and regular rhythm.   Pulmonary/Chest: Effort normal and breath sounds normal.  Abdominal: Soft. Bowel sounds are normal.  Psychiatric:  Depressed affect, slow  in speech   Filed Vitals:   09/04/14 1126  BP: 122/84  Pulse: 61  Temp: 97.8 F (36.6 C)  TempSrc: Oral  Resp: 14  Height: 6\' 4"  (1.93 m)  Weight: 259 lb 12.8 oz (117.845 kg)  SpO2: 96%      Assessment & Plan:

## 2014-09-05 NOTE — Assessment & Plan Note (Signed)
Unclear if his fatigue is related to his major depression disorder versus possibly undiscovered diagnoses. Check nutritional vitamins, MRI brain. If no findings will refer to neurology.

## 2014-09-06 ENCOUNTER — Encounter: Payer: Self-pay | Admitting: Internal Medicine

## 2014-09-11 ENCOUNTER — Telehealth: Payer: Self-pay | Admitting: Internal Medicine

## 2014-09-11 NOTE — Telephone Encounter (Signed)
Order for MRI has been sent to [place]. I spoke w/pt's spouse and she is aware.

## 2014-09-11 NOTE — Telephone Encounter (Signed)
Could one of you ladies please call this patients wife.  She is upset b/c Dr. Doug Sou told her she would have heard something back before now on the schedule date for an MRI.  She is requesting a phone call as soon as possible.

## 2014-09-12 ENCOUNTER — Ambulatory Visit
Admission: RE | Admit: 2014-09-12 | Discharge: 2014-09-12 | Disposition: A | Discharge status: Home / Self Care | Payer: BC Managed Care – PPO | Source: Ambulatory Visit | Attending: Internal Medicine | Admitting: Internal Medicine

## 2014-09-12 DIAGNOSIS — R269 Unspecified abnormalities of gait and mobility: Secondary | ICD-10-CM

## 2014-09-15 ENCOUNTER — Telehealth (HOSPITAL_COMMUNITY): Payer: Self-pay | Admitting: *Deleted

## 2014-09-15 NOTE — Addendum Note (Signed)
Addended by: Vertell Novak A on: 09/15/2014 12:42 PM   Modules accepted: Orders

## 2014-09-15 NOTE — Telephone Encounter (Signed)
Patient left VM stating he wanted to be contacted regarding some symptoms and side effects he is experiencing Phoned patient - left generic message on un named VM - MD office calling back.Please call office at his convenience.

## 2014-09-16 ENCOUNTER — Telehealth (HOSPITAL_COMMUNITY): Payer: Self-pay | Admitting: Psychiatry

## 2014-09-16 MED ORDER — BENZTROPINE MESYLATE 0.5 MG PO TABS: 0.5000 mg | ORAL_TABLET | Freq: Every day | ORAL | Status: DC

## 2014-09-16 NOTE — Telephone Encounter (Signed)
I returned patient's phone call.  I spoke to his wife who mentioned that MRI brain was normal and she is relieved that patient has no Parkinson.  However he is still have symptoms of stiffness tremors and shakes.  He is scheduled to see neurologist in few days.  He is feeling better with latuda and wondering if something else can help his shakes and tremors.  I recommended to try Cogentin low-dose to help the side effects of latuda and keep appointment with neurologist.  If symptoms does not improve call us back.

## 2014-09-16 NOTE — Telephone Encounter (Signed)
I returned patient's phone call at his home and left a message.

## 2014-09-17 ENCOUNTER — Other Ambulatory Visit: Payer: Self-pay

## 2014-09-18 ENCOUNTER — Ambulatory Visit (INDEPENDENT_AMBULATORY_CARE_PROVIDER_SITE_OTHER): Payer: BC Managed Care – PPO | Admitting: Neurology

## 2014-09-18 ENCOUNTER — Encounter: Payer: Self-pay | Admitting: Neurology

## 2014-09-18 VITALS — BP 140/64 | HR 64 | Ht 76.0 in | Wt 257.0 lb

## 2014-09-18 DIAGNOSIS — F332 Major depressive disorder, recurrent severe without psychotic features: Secondary | ICD-10-CM

## 2014-09-18 DIAGNOSIS — R251 Tremor, unspecified: Secondary | ICD-10-CM

## 2014-09-18 DIAGNOSIS — G20 Parkinson's disease: Secondary | ICD-10-CM

## 2014-09-18 NOTE — Patient Instructions (Signed)
1. We are referring you to [facility] for a DaT scan. We will make sure this scan is covered by your insurance and then they will contact you directly to set up a time for this testing. If you do not hear from them they can be contacted at [phone]. We will follow up after scan.

## 2014-09-18 NOTE — Progress Notes (Addendum)
[name] [name] was seen today in the movement disorders clinic for neurologic consultation at the request of Olga Millers, MD.  The consultation is for the evaluation of slowness, flat affect and to r/o a neurologic disorder.  This patient is accompanied in the office by his spouse who supplements the history.  The first symptom(s) the patient noticed was a feeling of weakness that has been going on for about a year.  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  For about a year and a half he has noted a "body" tremor and his wife has noted a hand tremor in both hands (at rest).  He is stiff like he is "a 63 year old man."  Once he is in bed he finds it is hard to move.  The patient has a hx of significant depression and is on multiple medications related to this.  He was started on cogentin just yesterday, but is also on celexa, wellbutrin and latuda.  He has been on [place] for 1.5 months.  Prior to that he was on abilify (tried on 2 separate times but most recently was only on it for a day) and geodon(doesn't remember this but looks like it may have been given in the past).  He has been on risperdal but cannot remember how long ago.  ECT was offered but his wife does not want him to proceed with that  Specific Symptoms:  Tremor: Yes.   Voice: no per pt, weaker per wife Sleep: interrupted by bathroom  Vivid Dreams:  Yes.    Acting out dreams:  No. Wet Pillows: Yes.   Postural symptoms:  Yes.    (feet feel "cemented to the ground" for 6 months) Falls?  No. Bradykinesia symptoms: shuffling gait, slow movements, difficulty getting out of a chair and difficulty regaining balance Loss of smell:  Yes.   (attributes to sinus condition) Loss of taste:  No. Urinary Incontinence:  Yes.   Difficulty Swallowing:  No. Handwriting, micrographia: Yes.   (R hand dominant) Trouble with ADL's:  No. (slower than previous  though)  Trouble buttoning clothing: No. Depression:  Yes.   Memory changes:  Yes.   Hallucinations:  No.  visual distortions: No. N/V:  No. Lightheaded:  Yes.    Syncope: No. Diplopia:  No. (blurry vision) Dyskinesia:  No.  Neuroimaging has previously been performed.  It is available for my review today and I reviewed it with him.  There is no BG disease.  ALLERGIES:  No Known Allergies  CURRENT MEDICATIONS:  Outpatient Encounter Prescriptions as of 09/18/2014  Medication Sig  . B Complex-C (B-COMPLEX WITH VITAMIN C) tablet Take 1 tablet by mouth daily.  . benztropine (COGENTIN) 0.5 MG tablet Take 1 tablet (0.5 mg total) by mouth daily.  Marland Kitchen buPROPion (WELLBUTRIN XL) 300 MG 24 hr tablet TAKE ONE TABLET BY MOUTH EVERY MORNING   . citalopram (CELEXA) 40 MG tablet TAKE ONE TABLET BY MOUTH ONE TIME DAILY   . Lurasidone HCl 20 MG TABS Take 1 tablet (20 mg total) by mouth daily after breakfast.  . Omega 3-6-9 Fatty Acids (OMEGA 3-6-9 PO) Take by mouth.  . TURMERIC CURCUMIN PO Take by mouth.    PAST MEDICAL HISTORY:   Past Medical History  Diagnosis Date  . Headache(784.0)   . Cardiac murmur     as a child  . Streptococcal meningitis  as an infant  . Depression   .  OSA (obstructive sleep apnea) 01/17/2011    npsg 2012:  AHI 67/hr. Auto titration 2012:  Optimal pressure 12cm.   Marland Kitchen HEADACHES, HX OF 02/18/2008    Qualifier: Diagnosis of  By: Danny Lawless CMA, [place]    . APPENDECTOMY, HX OF 02/18/2008    Qualifier: Diagnosis of  By: Danny Lawless CMA, [place]      PAST SURGICAL HISTORY:   Past Surgical History  Procedure Laterality Date  . Nasal sinus surgery      x 4 as a child  . Vasectomy    . Appendectomy  1967    SOCIAL HISTORY:   History   Social History  . Marital Status: Married    Spouse Name: N/A    Number of Children: Y  . Years of Education: N/A   Occupational History  . 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 Smoking status: Never  Smoker   . Smokeless tobacco: Never Used  . Alcohol Use: No  . Drug Use: No  . Sexual Activity: Yes    Birth Control/ Protection: None   Other Topics Concern  . Not on file   Social History Narrative    FAMILY HISTORY:   Family Status  Relation Status Death Age  . Father Deceased     lung cancer, alzheimer's  . Mother Alive     HTN, A fib  . Brother Deceased     accident  . Sister Alive     healthy  . Son Alive     healthy  . Daughter Alive     healthy  . Daughter Alive     healthy  . Maternal Aunt Alive     Parkinson's Disease    ROS:  A complete 10 system review of systems was obtained and was unremarkable apart from what is mentioned above.  PHYSICAL EXAMINATION:    VITALS:   Filed Vitals:   09/18/14 0923  BP: 140/64  Pulse: 64  Height: 6\' 4"  (1.93 m)  Weight: 257 lb (116.574 kg)    GEN:  The patient appears stated age and is in NAD. HEENT:  Normocephalic, atraumatic.  The mucous membranes are moist. The superficial temporal arteries are without ropiness or tenderness. CV:  RRR Lungs:  CTAB Neck/HEME:  There are no carotid bruits bilaterally.  Neurological examination:  Orientation: The patient is alert and oriented x3. Fund of knowledge is appropriate.  Recent and remote memory are intact.  Attention and concentration are normal.    Able to name objects and repeat phrases. Cranial nerves: There is good facial symmetry. 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  Pupils are equal round and reactive to light bilaterally. Fundoscopic exam reveals clear margins bilaterally. Extraocular muscles are intact. There are no square wave jerks.  The visual fields are full to confrontational testing. The speech is fluent and clear.  He is hypophonic.  The patient is able to make the gutteral sounds without difficulty. Soft palate rises symmetrically and there is no tongue deviation. Hearing is intact to conversational tone. Sensation: Sensation is intact to light and  pinprick throughout (facial, trunk, extremities). Vibration is intact at the bilateral big toe. There is no extinction with double simultaneous stimulation. There is no sensory dermatomal level identified. Motor: Strength is 5/5 in the bilateral upper and lower extremities.   Shoulder shrug is equal and symmetric.  There is no pronator drift. Deep tendon  reflexes: Deep tendon reflexes are 2+/4 at the bilateral biceps, triceps, brachioradialis, patella and 1/4 at the bilateral achilles. Plantar responses are downgoing bilaterally.  Movement examination: Tone: There is normal tone in the bilateral upper extremities.  The tone in the lower extremities is increased bilaterally.  Abnormal movements: mild tremor on the L with distraction procedures (and can only be felt) Coordination:  There is decremation with RAM's, seen with finger taps bilaterally, hand opening and closing on the L, and alternation of supination/pronation bilaterally.   Gait and Station: The patient has some difficulty arising out of a deep-seated chair without the use of the hands.  He rocks to get out of the chair.   The patient's stride length is decreased with decreased arm swing and some shuffling.  The patient has a positive pull test.      ASSESSMENT/PLAN:  Parkinsonism with tremor  -While he certainly could have idiopathic PD, especially since he has only been on the [place] for one month and sx's preceded that, he was exposed to multiple other antipsychotics and I am unsure of the timeline of those exposures, particularly of the risperdal.  I will try to get [facility] med hx from [facility].  In any case, I did tell them that IF this is idiopathic PD, I would be leery about having an atypical antipsychotic (except seroquel) unless absolutely necessary because it will worsen symptoms of parkinsonism.  I do understand, however, that he has had very difficult to treat depression and he will be continuing to work with psychiatry in that regard.   His wife is pretty firm that she doesn't want him off of Latuda.  -Talked to them about a DaT scan as they really don't want to try and get off of the [place].  I'm not sure that his insurance will pay for it but we will check.  -They asked me about the benztropine started yesterday.  I have no objection to it but told them that it won't help parkinsonism, but it may help tremor.  -They asked me about levodopa.  I told them that I am not willing to start that without knowing if this is a pre-synaptic or post-synaptic event.  In addition, it will be difficult to treat if it is a pre-synaptic event (dopamine loss) if a dopamine blocking drug is present.  Will discuss further if we are able to get a DaT scan done.    -Reviewed MRI brain with them  -Discusssed safety.  -f/u after above completed.  Much greater than 50% of 60 min visit in counseling with pt/wife.   ADDENDUM: [facility] contacted. He has been filling medications at this pharmacy since 2012 and he has no history of filling Risperdal here.

## 2014-10-01 ENCOUNTER — Telehealth (HOSPITAL_COMMUNITY): Payer: Self-pay | Admitting: *Deleted

## 2014-10-01 ENCOUNTER — Telehealth: Payer: Self-pay | Admitting: *Deleted

## 2014-10-01 NOTE — Telephone Encounter (Signed)
Spoke with patient and discussed message below. He will make an appt with his psychiatrist to discuss options and follow up with [place] after. He will call with any problems prior.

## 2014-10-01 NOTE — Telephone Encounter (Signed)
Wife left VM: on 09/23/14. VM heard by this writer on 10/01/14. NOTE: Wife Requested call back on date of call-09/23/14  Pt is scheduled for DAT scan on January  7th at [facility] By Dr. Murlean Hark. He was instructed by MD to stop Wellbutrin 8 days before and Benztropine 5 days before scan. Wife wants to know if this is safe?   **Per notes from other provider's office in chart dated 10/02/14, pt has decided not to have have DAT scan at this time due to cost.

## 2014-10-01 NOTE — Telephone Encounter (Signed)
Spoke with patient and they can not afford Dat Scan - with insurance coverage it is still over $2000.00. Please advise next steps without scan.

## 2014-10-01 NOTE — Telephone Encounter (Signed)
Based on history alone, it sounds like he probably has parkinsons disease.  However, as I told him before, the Anette Guarneri is going to make his parkinsonism worse and they need to take this very serious and it going to make treatment difficult.  I think that they need to talk with psychiatry about diagnosis to see what other options are and then make appt here.  Would prefer if you talk to pt himself

## 2014-10-01 NOTE — Telephone Encounter (Signed)
Patents wife would like for you to call ASAP in reference to his DAT scann.  Call back number [phone]

## 2014-10-23 ENCOUNTER — Ambulatory Visit (INDEPENDENT_AMBULATORY_CARE_PROVIDER_SITE_OTHER): Payer: BLUE CROSS/BLUE SHIELD | Admitting: Psychiatry

## 2014-10-23 ENCOUNTER — Encounter (HOSPITAL_COMMUNITY): Payer: Self-pay | Admitting: Psychiatry

## 2014-10-23 ENCOUNTER — Telehealth: Payer: Self-pay | Admitting: Neurology

## 2014-10-23 VITALS — BP 135/81 | HR 65 | Ht 76.0 in | Wt 257.8 lb

## 2014-10-23 DIAGNOSIS — F332 Major depressive disorder, recurrent severe without psychotic features: Secondary | ICD-10-CM

## 2014-10-23 MED ORDER — BUPROPION HCL ER (XL) 300 MG PO TB24: 300.0000 mg | ORAL_TABLET | Freq: Every morning | ORAL | Status: DC

## 2014-10-23 MED ORDER — CITALOPRAM HYDROBROMIDE 40 MG PO TABS: 40.0000 mg | ORAL_TABLET | Freq: Every day | ORAL | Status: DC

## 2014-10-23 NOTE — Telephone Encounter (Signed)
Spoke with pts psychiatrist, who kindly called me about the pt.  He is going to d/c Latuda and leave on Celexa and wellbutrin.  Jade, please make sure pt has f/u with me

## 2014-10-23 NOTE — Telephone Encounter (Signed)
Follow up appt made

## 2014-10-23 NOTE — Progress Notes (Signed)
[name] [name] [phone] Progress Note  [name] [name] [id number] 64 y.o.  10/23/2014 11:23 AM  Chief Complaint:  Medication management and follow up.        History of Present Illness:  [name] [name] came with his wife for his followup appointment.  He recently seen his neurologist Dr. [name] [name] and he is given diagnosis of Parkinson.  Patient and his wife is very concerned about the diagnosis .  Even though Anette Guarneri is helping the mood and depression get concern that it may not help the Parkinson tremors.  Patient supposed to have DAT scan however due to expense he had not decided for this test.  Patient and his wife endorsed improvement in his depression however due to recent diagnosis of Parkinson there has been a lot of anxiety and nervousness.  Patient continues to sleep more than 8 hours and he mentioned decreased energy and fatigue.  He mentioned generalized weakness and his movement has been slow from the past.  His wife is concerned about rapid decline in his energy level, walking.  Patient denies any paranoia or any hallucination.  He denies any crying spells or any irritability or anger.  His wife is very supportive.  Patient denies any suicidal thoughts or homicidal thought.  He is taking Celexa and Wellbutrin.  We have been giving samples of Latuda.  Patient denies drinking or using any illegal substances.  He continues to work at [employer].  Suicidal Ideation: No Plan Formed: No Patient has means to carry out plan: No  Homicidal Ideation: No Plan Formed: No Patient has means to carry out plan: No  Review of Systems  Constitutional: Positive for malaise/fatigue.  Skin: Negative for itching and rash.  Neurological: Positive for tremors and weakness.  Psychiatric/Behavioral: Positive for depression. The patient is nervous/anxious.    Psychiatric: Agitation: No Hallucination: No Depressed Mood: Yes Insomnia: No Hypersomnia: Yes Altered Concentration: No Feels  Worthless: No Grandiose Ideas: No Belief In Special Powers: No New/Increased Substance Abuse: No Compulsions: No  Neurologic: Headache: No Seizure: No Paresthesias: No  Past Medical History: Hypogonadism  Social history. Patient is employed and married.  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  Outpatient Encounter Prescriptions as of 10/23/2014  Medication Sig  . [DISCONTINUED] Lurasidone HCl 20 MG TABS Take 1 tablet (20 mg total) by mouth daily after breakfast.  . B Complex-C (B-COMPLEX WITH VITAMIN C) tablet Take 1 tablet by mouth daily.  Marland Kitchen buPROPion (WELLBUTRIN XL) 300 MG 24 hr tablet Take 1 tablet (300 mg total) by mouth every morning.  . citalopram (CELEXA) 40 MG tablet Take 1 tablet (40 mg total) by mouth daily.  . Omega 3-6-9 Fatty Acids (OMEGA 3-6-9 PO) Take by mouth.  . TURMERIC CURCUMIN PO Take by mouth.  . [DISCONTINUED] benztropine (COGENTIN) 0.5 MG tablet Take 1 tablet (0.5 mg total) by mouth daily.  . [DISCONTINUED] buPROPion (WELLBUTRIN XL) 300 MG 24 hr tablet TAKE ONE TABLET BY MOUTH EVERY MORNING   . [DISCONTINUED] citalopram (CELEXA) 40 MG tablet TAKE ONE TABLET BY MOUTH ONE TIME DAILY     Past Psychiatric History/Hospitalization(s): Patient denies any history of suicidal attempt or any inpatient psychiatric treatment.  He denies any history of mania, psychosis, paranoia or any hallucination.  In the past he had tried Effexor, Abilify, Paxil, Cymbalta, Zoloft, amitriptyline, Pristiq, Britnellex, Celexa, Deplin and Vyvanse.  He is seen in this office since September 2009.  He has seen multiple psychiatrists in the  past.  Anxiety: No Bipolar Disorder: No Depression: Yes Mania: No Psychosis: No Schizophrenia: No Personality Disorder: No Hospitalization for psychiatric illness: No History of Electroconvulsive Shock Therapy: No Prior Suicide Attempts: No  Physical Exam: Constitutional:  BP 135/81 mmHg  Pulse 65  Ht 6\' 4"  (1.93 m)  Wt  257 lb 12.8 oz (116.937 kg)  BMI 31.39 kg/m2  Recent Results (from the past 2160 hour(s))  Testosterone, free, total     Status: Abnormal   Collection Time: 09/04/14 12:15 PM  Result Value Ref Range   Testosterone 197 (L) 300 - 890 ng/dL    Comment:           Tanner Stage       Male              Male               I              < 30 ng/dL        < 10 ng/dL               II             < 150 ng/dL       < 30 ng/dL               III            100-320 ng/dL     < 35 ng/dL               IV             200-970 ng/dL     15-40 ng/dL               V/Adult        300-890 ng/dL     10-70 ng/dL      Sex Hormone Binding 26 13 - 71 nmol/L   Testosterone, Free 42.7 (L) 47.0 - 244.0 pg/mL    Comment:   The concentration of free testosterone is derived from a mathematical expression based on constants for the binding of testosterone to sex hormone-binding globulin and albumin.    Testosterone-% Free 2.2 1.6 - 2.9 %  B12     Status: None   Collection Time: 09/04/14 12:15 PM  Result Value Ref Range   Vitamin B-12 416 211 - 911 pg/mL  Folate     Status: None   Collection Time: 09/04/14 12:15 PM  Result Value Ref Range   Folate 14.8 >5.9 ng/mL    General Appearance: well nourished  Musculoskeletal: Strength & Muscle Tone: decreased Gait & Station: unsteady Patient leans: N/A   Mental Status Examination: Patient is casually dressed and fairly groomed.  His speech is slow with decreased volume and tone.  His response to the question is slow.  His thought process is slow but coherent.  He has sometimes thought blocking .  His affect is flat.  He denies any auditory or visual hallucination.  He denies any active or passive suicidal thoughts and homicidal thoughts.  There were no delusions or any paranoia.  His attention and concentration is fair. He described his mood anxious and his affect is improved from the past.  He is alert and oriented x3.  There were no tremors or shakes.  His  psychomotor activity is decreased.  His fund of knowledge is average.  His insight judgment and impulse control is okay.  Established Problem, Stable/Improving (1), Review  of Psycho-Social Stressors (1), Discuss test with performing physician (1), Review and summation of old records (2), Review of Last Therapy Session (1), Review of Medication Regimen & Side Effects (2) and Review of New Medication or Change in Dosage (2)  Assessment: Axis I: Maj. depressive disorder, recurrent, severe  Axis II: Deferred  Axis III: Hypogonadism   Plan:  I talked to his neurologist Dr. [name] [name] at [phone] and discuss psychotropic medication.  She does not want Latuda to be prescribed because it may cause worsening of Parkinson.  Patient has not started Parkinson treatment yet and he will see the neurologist very soon.  I recommended to decrease Latuda 10 mg for 5 days and then 5 mg for another 5 days and stop.  I will also discontinue Cogentin .  For now we will continue Wellbutrin and Celexa only and recommended to see Eloise Levels for counseling.  If symptoms started to get worse we will consider adding low-dose Seroquel however it may cause excessive sedation.  I will see him again in 6 weeks.  I encouraged to keep appointment with neurologist so he can start Parkinson treatment.  Patient and his wife agree with the plan.  I will see him again in 6 weeks. Time spent 25 minutes.  More than 50% of the time spent in psychoeducation, counseling and coordination of care.  Discuss safety plan that anytime having active suicidal thoughts or homicidal thoughts then patient need to call 911 or go to the local emergency room.   Helaine Yackel T., MD 10/23/2014

## 2014-10-23 NOTE — Telephone Encounter (Signed)
Left message on machine for patient to call back. To schedule follow up appt in about 4 weeks.

## 2014-11-03 DIAGNOSIS — G20A1 Parkinson's disease without dyskinesia, without mention of fluctuations: Secondary | ICD-10-CM

## 2014-11-03 DIAGNOSIS — G20 Parkinson's disease: Secondary | ICD-10-CM

## 2014-11-03 HISTORY — DX: Parkinson's disease without dyskinesia, without mention of fluctuations: G20.A1

## 2014-11-03 HISTORY — DX: Parkinson's disease: G20

## 2014-11-12 ENCOUNTER — Encounter: Payer: Self-pay | Admitting: Neurology

## 2014-11-20 ENCOUNTER — Ambulatory Visit (INDEPENDENT_AMBULATORY_CARE_PROVIDER_SITE_OTHER): Payer: BLUE CROSS/BLUE SHIELD | Admitting: Neurology

## 2014-11-20 ENCOUNTER — Telehealth: Payer: Self-pay | Admitting: Neurology

## 2014-11-20 ENCOUNTER — Encounter: Payer: Self-pay | Admitting: Neurology

## 2014-11-20 VITALS — BP 126/62 | HR 84 | Ht 76.0 in | Wt 244.0 lb

## 2014-11-20 DIAGNOSIS — R630 Anorexia: Secondary | ICD-10-CM

## 2014-11-20 DIAGNOSIS — G20 Parkinson's disease: Secondary | ICD-10-CM

## 2014-11-20 DIAGNOSIS — R634 Abnormal weight loss: Secondary | ICD-10-CM

## 2014-11-20 MED ORDER — PRAMIPEXOLE DIHYDROCHLORIDE 0.5 MG PO TABS: 0.5000 mg | ORAL_TABLET | Freq: Three times a day (TID) | ORAL | Status: DC

## 2014-11-20 MED ORDER — PRAMIPEXOLE DIHYDROCHLORIDE 0.125 MG PO TABS: ORAL_TABLET | ORAL | Status: DC

## 2014-11-20 NOTE — Patient Instructions (Signed)
1. Start mirapex (pramipexole) as follows:  0.125 mg - 1 tablet three times per day for a week, then 2 tablets three times per day for a week and then fill the 0.5 mg tablet and take that, 1 pill three times per day. 2. You have been referred to [facility]. They will call you directly to schedule an appointment.  Please call [phone] if you do not hear from them.

## 2014-11-20 NOTE — Progress Notes (Signed)
[name] [name] was seen today in the movement disorders clinic for neurologic consultation at the request of Olga Millers, MD.  The consultation is for the evaluation of slowness, flat affect and to r/o a neurologic disorder.  This patient is accompanied in the office by his spouse who supplements the history.  The first symptom(s) the patient noticed was a feeling of weakness that has been going on for about a year.  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  For about a year and a half he has noted a "body" tremor and his wife has noted a hand tremor in both hands (at rest).  He is stiff like he is "a 64 year old man."  Once he is in bed he finds it is hard to move.  The patient has a hx of significant depression and is on multiple medications related to this.  He was started on cogentin just yesterday, but is also on celexa, wellbutrin and latuda.  He has been on [place] for 1.5 months.  Prior to that he was on abilify (tried on 2 separate times but most recently was only on it for a day) and geodon(doesn't remember this but looks like it may have been given in the past).  He has been on risperdal but cannot remember how long ago.  ECT was offered but his wife does not want him to proceed with that  11/20/14 update:  The patient returns today for follow-up.  He is accompanied by his wife who supplements the history.  He has been off of [place] for about 2 weeks.  Unfortunately, he was unable to afford the dat scan.  He is on just Wellbutrin and Celexa for depression and his Cogentin and has been discontinued.  He has noted a decreased appetite and dry mouth and having trouble sleeping because of stiffness.  Pt states that he is almost afraid to go to sleep.  Missing 2 days of work per week (works nights) but a lot of that because of anxiety.  His wife notices increased tremor.  He has slow at work.  Admits to some  lightheadedness but no syncope.  Note diplopia.  No hallucinations.  His wife is doing most of the driving because he is having difficulty staying in his own lane.  Neuroimaging has previously been performed.  It is available for my review today and I reviewed it with him.  There is no BG disease.  ALLERGIES:  No Known Allergies  CURRENT MEDICATIONS:  Outpatient Encounter Prescriptions as of 11/20/2014  Medication Sig  . B Complex-C (B-COMPLEX WITH VITAMIN C) tablet Take 1 tablet by mouth daily.  Marland Kitchen buPROPion (WELLBUTRIN XL) 300 MG 24 hr tablet Take 1 tablet (300 mg total) by mouth every morning.  . citalopram (CELEXA) 40 MG tablet Take 1 tablet (40 mg total) by mouth daily.  . Omega 3-6-9 Fatty Acids (OMEGA 3-6-9 PO) Take by mouth.  . TURMERIC CURCUMIN PO Take by mouth.    PAST MEDICAL HISTORY:   Past Medical History  Diagnosis Date  . Headache(784.0)   . Cardiac murmur     as a child  . Streptococcal meningitis     as an infant  . Depression   .  OSA (obstructive sleep apnea) 01/17/2011    npsg 2012:  AHI 67/hr. Auto titration 2012:  Optimal pressure 12cm.   Marland Kitchen HEADACHES, HX OF 02/18/2008  Qualifier: Diagnosis of  By: [place], [place]    . APPENDECTOMY, HX OF 02/18/2008    Qualifier: Diagnosis of  By: Danny Lawless CMA, [place]      PAST SURGICAL HISTORY:   Past Surgical History  Procedure Laterality Date  . Nasal sinus surgery      x 4 as a child  . Vasectomy    . Appendectomy  1967    SOCIAL HISTORY:   History   Social History  . Marital Status: Married    Spouse Name: N/A  . Number of Children: Y  . Years of Education: N/A   Occupational History  . 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 Smoking status: Never Smoker   . Smokeless tobacco: Never Used  . Alcohol Use: No  . Drug Use: No  . Sexual Activity: Yes    Birth Control/ Protection: None   Other Topics Concern  . Not on file   Social History Narrative    FAMILY  HISTORY:   Family Status  Relation Status Death Age  . Father Deceased     lung cancer, alzheimer's  . Mother Alive     HTN, A fib  . Brother Deceased     accident  . Sister Alive     healthy  . Son Alive     healthy  . Daughter Alive     healthy  . Daughter Alive     healthy  . Maternal Aunt Alive     Parkinson's Disease    ROS:  A complete 10 system review of systems was obtained and was unremarkable apart from what is mentioned above.  PHYSICAL EXAMINATION:    VITALS:   Filed Vitals:   11/20/14 1042  BP: 126/62  Pulse: 84  Height: 6\' 4"  (1.93 m)  Weight: 244 lb (110.678 kg)   Wt Readings from Last 3 Encounters:  11/20/14 244 lb (110.678 kg)  10/23/14 257 lb 12.8 oz (116.937 kg)  09/18/14 257 lb (116.574 kg)     GEN:  The patient appears stated age and is in NAD. HEENT:  Normocephalic, atraumatic.  The mucous membranes are moist. The superficial temporal arteries are without ropiness or tenderness. CV:  RRR Lungs:  CTAB Neck/HEME:  There are no carotid bruits bilaterally.  Neurological examination:  Orientation: The patient is alert and oriented x3. Fund of knowledge is appropriate.  Recent and remote memory are intact.  Attention and concentration are normal.    Able to name objects and repeat phrases. Cranial nerves: There is good facial symmetry. 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  Pupils are equal round and reactive to light bilaterally. Fundoscopic exam reveals clear margins bilaterally. Extraocular muscles are intact. There are no square wave jerks.  The visual fields are full to confrontational testing. The speech is fluent and clear.  He is hypophonic.  The patient is able to make the gutteral sounds without difficulty. Soft palate rises symmetrically and there is no tongue deviation. Hearing is intact to conversational tone. Sensation: Sensation is intact to light and pinprick throughout (facial, trunk, extremities). Vibration is intact at the  bilateral big toe. There is no extinction with double simultaneous stimulation. There is no sensory dermatomal level identified. Motor: Strength is 5/5 in the bilateral upper and lower extremities.   Shoulder shrug is equal and symmetric.  There is no pronator drift. Deep tendon reflexes: Deep tendon reflexes are 2+/4 at the bilateral biceps, triceps, brachioradialis,  patella and 1/4 at the bilateral achilles. Plantar responses are downgoing bilaterally.  Movement examination: Tone: There is mild increased left upper extremity tone, but only with activation procedure. Abnormal movements: No tremor noted today even with distraction Coordination:  There is decremation with RAM's, seen with finger taps bilaterally, hand opening and closing on the L. Gait and Station: The patient has no difficulty arising out of a deep-seated chair without the use of the hands.  He rocks to get out of the chair.   The patient's stride length is decreased with decreased arm swing.  His pull test was negative today.  ASSESSMENT/PLAN:  Parkinsonism   -While he certainly could have idiopathic PD, especially since he was only on the Latuda for 6-8 weeks and sx's preceded that, he was exposed to multiple other antipsychotics and I am unsure of the timeline of those exposures.  He has only been off of [place] for about 2 weeks, and his examination is actually somewhat improved compared to last visit, even though they do not endorse that.  I explained to them that technically, it would take [place] up to 6 months to now off of antipsychotic medication whether or not this was idiopathic Parkinson's disease.  He could not afford the dat scan.  They absolutely do not want to wait that long for treatment.  We talked about options.  We ultimately decided on a dopamine agonist, Mirapex.  I talked to them extensively about risks/benefits/side effects in detail, including but not limited to the risk of sleep attacks and compulsive behaviors.   Understanding was expressed.  He will work up to 0.5 mg 3 times per day.  -Talked to them about community resources.  Gave them information on the [employer] bicycle program.  Referred him to the neuro rehabilitation center.  -Stressed to him the importance of safe, cardiovascular exercise.  -Told the patient that I do not think that he should be driving, given that he is having difficulty staying in his own lane.  -I did fill out the patient's FMLA form, but I also told him that because many of his complaints were related to anxiety associated with going to work, I thought that his psychiatrist should also fill this out.  He is currently missing 2 nights per week of work, which is fairly excessive for Parkinson's alone and I told him if he misses that much, then he should pursue short-term/long-term disability, but he does not think that he can afford to do that. 2.  Appetite change with weight loss  -I told the patient that I do not think that this is related to parkinsonism or Parkinson's disease.  I do think that this could be related to depression.  He does not think that this is the case.  I told him that weight loss does not occur early on in the diagnosis of Parkinson's and loss of appetite is not associated with it.  I explained to him that this should be pursued with his medical doctor and/or psychiatrist. 3.  Follow-up in the next few months, sooner should new neurologic issues arise.  Greater than 50% of the 60 minute visit spent in counseling with the patient and his wife.   ADDENDUM: [facility] contacted. He has been filling medications at this pharmacy since 2012 and he has no history of filling Risperdal here.

## 2014-11-20 NOTE — Telephone Encounter (Signed)
FMLA form completed and mailed to [employer] HR department at [address] [place], [place] [address]. Copy to scanning.

## 2014-12-01 ENCOUNTER — Ambulatory Visit: Payer: BLUE CROSS/BLUE SHIELD | Attending: Neurology | Admitting: Physical Therapy

## 2014-12-01 ENCOUNTER — Ambulatory Visit: Payer: BLUE CROSS/BLUE SHIELD | Admitting: Occupational Therapy

## 2014-12-01 ENCOUNTER — Encounter: Payer: Self-pay | Admitting: Occupational Therapy

## 2014-12-01 DIAGNOSIS — R269 Unspecified abnormalities of gait and mobility: Secondary | ICD-10-CM | POA: Diagnosis not present

## 2014-12-01 DIAGNOSIS — R279 Unspecified lack of coordination: Secondary | ICD-10-CM

## 2014-12-01 DIAGNOSIS — R293 Abnormal posture: Secondary | ICD-10-CM

## 2014-12-01 DIAGNOSIS — Z7409 Other reduced mobility: Secondary | ICD-10-CM

## 2014-12-01 DIAGNOSIS — G20 Parkinson's disease: Secondary | ICD-10-CM | POA: Diagnosis not present

## 2014-12-01 DIAGNOSIS — R258 Other abnormal involuntary movements: Secondary | ICD-10-CM

## 2014-12-01 DIAGNOSIS — R29898 Other symptoms and signs involving the musculoskeletal system: Secondary | ICD-10-CM

## 2014-12-01 NOTE — Therapy (Signed)
[facility] [address] [place], [place], [address] Phone: [phone]   Fax:  [phone]  Occupational Therapy Evaluation  Patient Details  Name: [name] [name] MRN: [id number] Date of Birth: 03/24/1951 Referring Provider:  Olga Millers, MD  Encounter Date: 12/01/2014      OT End of Session - 12/01/14 0855    Visit Number 1   Number of Visits 17   Date for OT Re-Evaluation 01/29/15   Authorization Type BCBS 75 visit limit combined, no auth   OT Start Time [phone]   OT Stop Time 0843   OT Time Calculation (min) 56 min   Activity Tolerance Patient tolerated treatment well   Behavior During Therapy Flat affect      Past Medical History  Diagnosis Date  . Headache(784.0)   . Cardiac murmur     as a child  . Streptococcal meningitis     as an infant  . Depression   .  OSA (obstructive sleep apnea) 01/17/2011    npsg 2012:  AHI 67/hr. Auto titration 2012:  Optimal pressure 12cm.   Marland Kitchen HEADACHES, HX OF 02/18/2008    Qualifier: Diagnosis of  By: Danny Lawless CMA, [place]    . APPENDECTOMY, HX OF 02/18/2008    Qualifier: Diagnosis of  By: [place], [place]      Past Surgical History  Procedure Laterality Date  . Nasal sinus surgery      x 4 as a child  . Vasectomy    . Appendectomy  1967    There were no vitals taken for this visit.  Visit Diagnosis:  Bradykinesia  Rigidity  Lack of coordination  Abnormal posture  Decreased functional mobility and endurance      Subjective Assessment - 12/01/14 0754    Symptoms Pt reports weakness with difficulty with pushing objects and weakness, difficulty staying in lane while driving.  Pt only driving to work.   Pertinent History Diagnosed with Parkinson's approx 1 month ago.   Currently in Pain? No/denies          [facility] OT Assessment - 12/01/14 0001    Assessment   Diagnosis Parkinson's disease   Onset Date 11/20/14   Assessment Newly diagnosed  with Parkinson's disease.   Prior Therapy --  none   Balance Screen   Has the patient fallen in the past 6 months Yes   How many times? 1   Has the patient had a decrease in activity level because of a fear of falling?  No   Is the patient reluctant to leave their home because of a fear of falling?  No   Home  Environment   Family/patient expects to be discharged to: Private residence   Lives With Daughter;Spouse  62 y.o. 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   Vocation Requirements lifting up to 70lbs, pushing   Leisure stopped doing yardwork, sleeps alot now, no longer goes to yard sales or visit family out of time   ADL   Eating/Feeding Modified independent  difficulty opening packages   Grooming Modified independent  difficulty shaving, missing areas   Upper Body Bathing Modified independent  difficulty reaching back   Lower Body Bathing Modified independent   Upper Body Dressing Minimal assistance;Needs assist for fasteners  difficulty putting on jacket   Lower Body Dressing Needs assist for fasteners  difficulty tying, difficulty donning shoes/socks   Toilet Tranfer Modified independent   Toileting - Clothing Manipulation Modified independent   Tub/Shower Transfer Modified independent   Transfers/Ambulation Related to ADL's difficulty with bed mobility, getting up from recliner/sofa   ADL comments increased time for all tasks   IADL   Shopping --  needs increased time, does with wife   Prior Level of Function Light Housekeeping wife performs   Prior Level of Function Meal Prep wife performs   Programmer, applications own vehicle;Relies on family or friends for transportation  only driving to work (last 3-43mo), difficulty transferring   Medication Management --  difficulty remembering to take middle medicine   Prior  Level of Function Financial Management Pt reports that he/wife do together, but pt used to write checks and doesn't write them any longer   Mobility   Mobility Status History of falls;Independent;Freezing  1 fall out of bed   Written Expression   Dominant Hand Right   Handwriting 100% legible;Severe micrographia   Vision Assessment   Eye Alignment Impaired (comment)   Vision Assessment Vision not tested  significant change in past yr (blurriness), new glasses now   Activity Tolerance   Activity Tolerance --  limits activity, less leisure activities, sleeps alot   Cognition   Memory Impaired   Memory Impairment Decreased short term memory   Bradyphrenia Yes   Observation/Other Assessments   Observations forward lean, decreased arm swing, rounded shoulders   Standing Functional Reach Test R-12 inches, L-13 inches   Physical Performance Test   Yes   Simulated Eating Time (seconds) 15.35   Donning Doffing Jacket Time (seconds) 24.53   Coordination   9 Hole Peg Test Right;Left   Right 9 Hole Peg Test 32.59   Left 9 Hole Peg Test 35.66   Box and Blocks R-37blocks, L-45blocks   Tremors intermittently in both hands per pt/wife report   Tone   Assessment Location Right Upper Extremity;Left Upper Extremity   ROM / Strength   AROM / PROM / Strength AROM   AROM   Overall AROM  Within functional limits for tasks performed   RUE Tone   RUE Tone --  mild-mod   LUE Tone   LUE Tone --  mild-mod                       OT Education - 12/01/14 0851    Education provided Yes   Education Details Medication timing (take during awake hours)--pt to contact MD with questions/problems related to medicine; addressing symptoms with big movements/exercise; importance of exercise/medication in treatment of PD, recommended no lift chair at this time   Person(s) Educated Patient;Spouse   Methods Explanation;Verbal cues   Comprehension Verbalized understanding          OT Short  Term Goals - 12/01/14 0859    OT SHORT TERM GOAL #1   Title Pt will be independent with HEP--due 12/30/14   Time 4   Period Weeks   Status New   OT SHORT TERM GOAL #2   Title Pt will verbalize understanding of PD symptoms and ways to prevent future complications.--due 12/30/14   Time 4   Period Weeks   Status New   OT SHORT TERM GOAL #3   Title Pt will verbalize understanding of PD-specific appropriate community resources.--due 12/30/14   Time 4   Period Weeks   Status New   OT SHORT TERM GOAL #4   Title Pt will  complete dressing fasteners mod I using strategies/AE prn.--due 12/30/14   Time 4   Period Weeks   Status New   OT SHORT TERM GOAL #5   Title Pt will improve coordination/functional reaching as shown by improving score on box and blocks test by at least 10 with RUE.--due 12/30/14   Baseline 37   Time 4   Period Weeks   Status New           OT Long Term Goals - 12/01/14 1914    OT LONG TERM GOAL #1   Title Pt will verbalize understanding of AE/strategies for ADLs/IADLs prn.--due 01/29/15   Time 8   Period Weeks   Status New   OT LONG TERM GOAL #2   Title Pt will improve coordination for ADLs as shown by improving time on 9-hole peg test by at least 5 bilaterally.--due 01/29/15   Baseline R-32.59sec, L-35.66sec   Time 8   Period Weeks   Status New   OT LONG TERM GOAL #3   Title Pt will improve coordination/functional reaching as shown by improving score on box and blocks test by at least 5 with LUE.--due 01/29/15   Baseline 45   Time 8   Period Weeks   Status New   OT LONG TERM GOAL #4   Title Pt will improve coordination/functional reaching as shown by improving score on box and blocks test by at least 10 with RUE.--due 01/29/15   Baseline 37   Time 8   Period Weeks   Status New   OT LONG TERM GOAL #5   Title Pt will improve dressing ability as shown by improving time on PPT#4 by at least 5sec.--due 01/29/15   Baseline 24.53sec   Time 8   Period Weeks    Status New   Long Term Additional Goals   Additional Long Term Goals Yes   OT LONG TERM GOAL #6   Title Pt will improve bradykinesia as shown by improving time on PPT#2 by at least 5sec.--due 01/29/15   Baseline 15.35   Time 8   Period Weeks   Status New               Plan - 12/01/14 7829    Clinical Impression Statement Pt newly diagnosed with PD and with hx of depression presents today with bradykinesia, rigidity, abnormal posture, decreased coordination, decreased functional mobility affecting ADLS/IADL performance.   Pt will benefit from skilled therapeutic intervention in order to improve on the following deficits (Retired) Decreased coordination;Decreased endurance;Impaired tone;Decreased activity tolerance;Decreased balance;Decreased knowledge of use of DME;Impaired UE functional use;Decreased cognition;Decreased mobility;Impaired perceived functional ability  bradykinesia, decreased posture   Rehab Potential Good   OT Frequency 2x / week  +eval   OT Duration 4 weeks   OT Treatment/Interventions Self-care/ADL training;Moist Heat;DME and/or AE instruction;Splinting;Patient/family education;Balance training;Therapeutic exercises;Therapeutic exercise;Therapeutic activities;Cognitive remediation/compensation;Passive range of motion;Functional Mobility Training;Neuromuscular education;Cryotherapy;Electrical Stimulation;Energy conservation;Manual Therapy   Plan PWR! moves HEP   Consulted and Agree with Plan of Care Patient;Family member/caregiver   Family Member Consulted wife        Problem List Patient Active Problem List   Diagnosis Date Noted  . Sinus disease 07/28/2014  . MDD (major depressive disorder), recurrent episode, severe 02/10/2014  . Nonspecific abnormal electrocardiogram (ECG) (EKG) 02/07/2014  . Non-compliant behavior 02/03/2014  . Other abnormal glucose 10/02/2013  . Morton's neuroma of left foot 08/07/2013  . Stress fracture of left foot 07/17/2013  .  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  Major depressive disorder, recurrent episode, severe 03/13/2012  . Hyperglycemia 01/26/2012  . Hypogonadism male 01/24/2012  . Syncope and collapse 01/20/2012  .  OSA (obstructive sleep apnea) 01/17/2011  . CHEST TIGHTNESS 02/18/2008    Deshundra Waller, OTR/L 12/01/2014, 9:12 AM  [facility] [address] [place], [place], [address] Phone: [phone]   Fax:  [phone]

## 2014-12-02 DIAGNOSIS — G20 Parkinson's disease: Secondary | ICD-10-CM | POA: Insufficient documentation

## 2014-12-02 DIAGNOSIS — R269 Unspecified abnormalities of gait and mobility: Secondary | ICD-10-CM | POA: Insufficient documentation

## 2014-12-02 NOTE — Therapy (Signed)
[facility] [address] [place], [place], [address] Phone: [phone]   Fax:  [phone]  Physical Therapy Evaluation  Patient Details  Name: [name] [name] MRN: [id number] Date of Birth: 1950/10/17 Referring Provider:  Olga Millers, MD  Encounter Date: 12/01/2014      PT End of Session - 12/02/14 0816    Visit Number 1   Number of Visits 17   Date for PT Re-Evaluation 01/30/15   Authorization Type BCBS 75 visit limit combined   PT Start Time 0848   PT Stop Time 0930   PT Time Calculation (min) 42 min   Activity Tolerance Patient tolerated treatment well   Behavior During Therapy [facility] for tasks assessed/performed      Past Medical History  Diagnosis Date  . Headache(784.0)   . Cardiac murmur     as a child  . Streptococcal meningitis     as an infant  . Depression   .  OSA (obstructive sleep apnea) 01/17/2011    npsg 2012:  AHI 67/hr. Auto titration 2012:  Optimal pressure 12cm.   Marland Kitchen HEADACHES, HX OF 02/18/2008    Qualifier: Diagnosis of  By: Danny Lawless CMA, [place]    . APPENDECTOMY, HX OF 02/18/2008    Qualifier: Diagnosis of  By: [place], [place]      Past Surgical History  Procedure Laterality Date  . Nasal sinus surgery      x 4 as a child  . Vasectomy    . Appendectomy  1967    There were no vitals taken for this visit.  Visit Diagnosis:  Abnormality of gait  Bradykinesia  Posture abnormality  Postural instability      Subjective Assessment - 12/01/14 0851    Symptoms Pt is a 64 year old male who presents to OP PT with muscles stiffness and tremors, freezing with gait, difficulty with getting up from chair and bed, weakness, shuffling with gait, which patient has noticed progressivley over the past year, especially over the past few months.  Pt began Mirapex 2 weeks ago.  Pt has had one fall out of bed; no other falls.     Patient Stated Goals Pt's goal for therapy is to get  stronger and move better.   Currently in Pain? No/denies          [facility] PT Assessment - 12/01/14 0857    Assessment   Medical Diagnosis Parkinson's disease   Precautions   Precautions Fall   Precaution Comments pt describes freezing episodes with gait; some incontinence   Balance Screen   Has the patient fallen in the past 6 months Yes   How many times? 1  fell out of bed   Has the patient had a decrease in activity level because of a fear of falling?  No  fearful of falling due to chest pains with anxiety   Is the patient reluctant to leave their home because of a fear of falling?  No   Prior Function   Level of Independence Independent with basic ADLs;Independent with homemaking with ambulation;Independent with gait;Independent with transfers   Vocation Full time employment   Vocation Requirements lifting up to 70lbs, pushing  work is harder, pt feels weaker   Leisure stopped doing yardwork, sleeps alot now, no longer goes to yard sales or visit family out of time   Posture/Postural Control   Posture/Postural Control Postural limitations   Postural Limitations Rounded Shoulders;Forward head   ROM / Strength  AROM / PROM / Strength --  quads 3+ to 4/5; lower ext 4/5; stiffness bil with p/ROM   Transfers   Transfers Sit to Stand;Stand to Sit   Sit to Stand From chair/3-in-1;Five times sit to stand;7: Independent;Without upper extremity assist  5x sit<>stand 16.14 sec   Stand to Sit 7: Independent;Without upper extremity assist;To chair/3-in-1   Ambulation/Gait   Ambulation/Gait Yes   Ambulation/Gait Assistance 5: Supervision   Ambulation Distance (Feet) 200 Feet   Assistive device None   Gait Pattern Decreased step length - right;Decreased step length - left;Decreased dorsiflexion - left;Shuffle;Trunk flexed;Narrow base of support;Poor foot clearance - left  decr. arm swing   Ambulation Surface Level;Indoor   Gait velocity 16.63 sec=1.97 ft/sec   Gait velocity -  backwards 13.54 sec in 10 ft; 0.74 ft/sec   Stairs Yes   Stairs Assistance 6: Modified independent (Device/Increase time)   Stair Management Technique Two rails;Alternating pattern   Number of Stairs 4   Gait Comments overall slowed gait pattern   Standardized Balance Assessment   Standardized Balance Assessment Timed Up and Go Test   Timed Up and Go Test   TUG Normal TUG;Manual TUG;Cognitive TUG   Normal TUG (seconds) 19.16  decr. L arm swing, narrow BOS   Manual TUG (seconds) 20.44   Cognitive TUG (seconds) 23.42   Functional Gait  Assessment   Gait assessed  Yes   Gait Level Surface Walks 20 ft, slow speed, abnormal gait pattern, evidence for imbalance or deviates 10-15 in outside of the 12 in walkway width. Requires more than 7 sec to ambulate 20 ft.  11.15 sec   Change in Gait Speed Able to change speed, demonstrates mild gait deviations, deviates 6-10 in outside of the 12 in walkway width, or no gait deviations, unable to achieve a major change in velocity, or uses a change in velocity, or uses an assistive device.   Gait with Horizontal Head Turns Performs head turns with moderate changes in gait velocity, slows down, deviates 10-15 in outside 12 in walkway width but recovers, can continue to walk.   Gait with Vertical Head Turns Performs task with moderate change in gait velocity, slows down, deviates 10-15 in outside 12 in walkway width but recovers, can continue to walk.   Gait and Pivot Turn Turns slowly, requires verbal cueing, or requires several small steps to catch balance following turn and stop  4.33 sec   Step Over Obstacle Is able to step over one shoe box (4.5 in total height) but must slow down and adjust steps to clear box safely. May require verbal cueing.   Gait with Narrow Base of Support Is able to ambulate for 10 steps heel to toe with no staggering.   Gait with Eyes Closed Walks 20 ft, slow speed, abnormal gait pattern, evidence for imbalance, deviates 10-15 in  outside 12 in walkway width. Requires more than 9 sec to ambulate 20 ft.  19.04 sec   Ambulating Backwards Walks 20 ft, slow speed, abnormal gait pattern, evidence for imbalance, deviates 10-15 in outside 12 in walkway width.  13.54 sec in 10 ft   Steps Alternating feet, must use rail.   Total Score 14                            PT Short Term Goals - 12/02/14 0820    PT SHORT TERM GOAL #1   Title Pt will be independent with HEP for  improved transfers, balance, and gait. (Target 12/30/14)   Time 4   Period Weeks   Status New   PT SHORT TERM GOAL #2   Title Pt will improve 5x sit<>stand to less than or equal to 11.5 seconds for improved transfer efficiency and safety.   Baseline 16.14 sec   Time 4   Period Weeks   Status New   PT SHORT TERM GOAL #3   Title Pt will improve Functional Gait Assessment score to at least 18/30 for decreased fall risk.   Baseline 14/30   Time 4   Period Weeks   Status New   PT SHORT TERM GOAL #4   Title Pt will improve TUG score to less than or equal to 13.5 seconds for decreased fall risk.   Baseline TUG 19.16 sec   Time 4   Period Weeks   Status New   PT SHORT TERM GOAL #5   Title Pt will verbalize understanding of local Parkinson's-disease related resources.   Time 4   Period Weeks   Status New           PT Long Term Goals - 12/02/14 7106    PT LONG TERM GOAL #1   Title Pt will verbalize understanding of fall prevention techniques within the home environment. (Target 01/30/15)   Time 8   Period Weeks   Status New   PT LONG TERM GOAL #2   Title Pt will improve gait velocity to at least 2.62 ft/sec for improved gait efficiency and safety.   Time 8   Period Weeks   Status New   PT LONG TERM GOAL #3   Title Pt will improve Functional Gait Assessment to at least 22/30 for decreased fall risk.   Time 8   Period Weeks   Status New   PT LONG TERM GOAL #4   Title Pt will improve TUG cognitive score to less than or  equal to 15 seconds for decreased fall risk.   Time 8   Period Weeks   Status New   PT LONG TERM GOAL #5   Title Pt will verbalize plans for continued community fitness upon D/C from PT.   Time 8   Period Weeks   Status New               Plan - 12/02/14 0817    Clinical Impression Statement Pt is a 64 year old male who presents to PT with newly diagnosed Parkinson's disease, starting on Mirapex several weeks ago.  Pt presents with decreased timing and coordination with gait, difficulty with transfers, bradykinesia, rigidity, postural instability, and shuffling type gait pattern.  Per Timed Up and Go scores, Functional Gait Assessment score, pt is at risk for falls.  Pt would benefit from skilled PT to address functional strength, balance, transfers, and gait for overall improved functional mobility and decreased fall risk.   Pt will benefit from skilled therapeutic intervention in order to improve on the following deficits Abnormal gait;Decreased activity tolerance;Decreased balance;Decreased endurance;Decreased mobility;Difficulty walking;Decreased strength;Improper body mechanics;Postural dysfunction   Rehab Potential Good   PT Frequency 2x / week   PT Duration 8 weeks  plus eval   PT Treatment/Interventions ADLs/Self Care Home Management;Gait training;Stair training;Functional mobility training;Therapeutic activities;Neuromuscular re-education;Balance training;Therapeutic exercise;Patient/family education   PT Next Visit Plan Transfers, postural education for body mechanics with ADLs and work; gait; initiate HEP   PT Home Exercise Plan PWR! Moves, transfers, walking program   Consulted and Agree with Plan of Care  Patient;Family member/caregiver   Family Member Consulted wife          G-Codes - 12-16-2014 0828    Functional Assessment Tool Used TUG 19.16 sec, TUG cog 23.42, TUG man 20.44; gait velocity 1.97 ft/sec, 5x sit<>stand 16.14 sec; Functional Gait Assessment 14/30    Functional Limitation Mobility: Walking and moving around   Mobility: Walking and Moving Around Current Status [phone]) At least 40 percent but less than 60 percent impaired, limited or restricted   Mobility: Walking and Moving Around Goal Status [phone]) At least 20 percent but less than 40 percent impaired, limited or restricted       Problem List Patient Active Problem List   Diagnosis Date Noted  . Sinus disease 07/28/2014  . MDD (major depressive disorder), recurrent episode, severe 02/10/2014  . Nonspecific abnormal electrocardiogram (ECG) (EKG) 02/07/2014  . Non-compliant behavior 02/03/2014  . Other abnormal glucose 10/02/2013  . Morton's neuroma of left foot 08/07/2013  . Stress fracture of left foot 07/17/2013  . 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 Major depressive disorder, recurrent episode, severe 03/13/2012  . Hyperglycemia 01/26/2012  . Hypogonadism male 01/24/2012  . Syncope and collapse 01/20/2012  .  OSA (obstructive sleep apnea) 01/17/2011  . CHEST TIGHTNESS 02/18/2008    Koran Seabrook W. Dec 16, 2014, 8:29 AM  Mady Haagensen, PT December 16, 2014 8:30 AM Phone: [phone] Fax: [facility] [address] [place], [place], [address] Phone: [phone]   Fax:  [phone]

## 2014-12-03 ENCOUNTER — Ambulatory Visit (HOSPITAL_COMMUNITY): Payer: Self-pay | Admitting: Psychiatry

## 2014-12-04 ENCOUNTER — Ambulatory Visit (INDEPENDENT_AMBULATORY_CARE_PROVIDER_SITE_OTHER): Payer: Federal, State, Local not specified - PPO | Admitting: Psychiatry

## 2014-12-04 ENCOUNTER — Encounter (HOSPITAL_COMMUNITY): Payer: Self-pay | Admitting: Psychiatry

## 2014-12-04 ENCOUNTER — Ambulatory Visit: Payer: BLUE CROSS/BLUE SHIELD | Attending: Neurology | Admitting: Speech Pathology

## 2014-12-04 VITALS — BP 115/76 | HR 80 | Ht 76.0 in | Wt 244.6 lb

## 2014-12-04 DIAGNOSIS — G20 Parkinson's disease: Secondary | ICD-10-CM | POA: Diagnosis not present

## 2014-12-04 DIAGNOSIS — F332 Major depressive disorder, recurrent severe without psychotic features: Secondary | ICD-10-CM | POA: Diagnosis not present

## 2014-12-04 DIAGNOSIS — R490 Dysphonia: Secondary | ICD-10-CM

## 2014-12-04 MED ORDER — CITALOPRAM HYDROBROMIDE 40 MG PO TABS: 40.0000 mg | ORAL_TABLET | Freq: Every day | ORAL | Status: DC

## 2014-12-04 MED ORDER — BUPROPION HCL ER (XL) 300 MG PO TB24: 300.0000 mg | ORAL_TABLET | Freq: Every morning | ORAL | Status: DC

## 2014-12-04 NOTE — Patient Instructions (Signed)
   ABDOMINAL BREATHING  . Place your hand on your belly . Breathe in through your nose and fill your belly with air, watching your hand move outward . Breathe out through your mouth and watch your belly move in. An audible "sh" may help   Think of your belly as a balloon, when you fill with air, the balloon gets bigger. As the air goes out, the balloon deflates. Twice a day 10 minutes    Practice breathing in and out in front of a mirror, watching your belly Breathe in for a count of 5 and breathe out for a count of 5  LOUD [facility]! 5-10 minutes twice  Loud oral reading - big breath before each sentence - Think Loud! Think Shout!    Provided by: Barnabas Lister. ST [phone]

## 2014-12-04 NOTE — Progress Notes (Signed)
[name] [phone] Progress Note  [name] [name] [id number] 64 y.o.  12/04/2014 11:37 AM  Chief Complaint:  I have stopped taking latuda.  I still have shakes but it is getting better.          History of Present Illness:  [name] [name] came with his wife for his followup appointment.  He was recently seen by her neurologist Dr. [name] Tat and he is now taking medication for Parkinson.  He is very anxious and nervous with a diagnosis but overall his depression is better.  He is back to work 5 days a week but he admitted some time feels very tired, lethargic and slow.  His neurologist had completed FMLA papers.  His wife is also very concerned about his prognosis and future.  Patient started to have forgetfulness and memory impairment.  He is only driving from home to work and coming back from work to home.  Patient told his wife calls very frequently during the day if he is okay and make sure that he is not losing the directions and maps.  He admitted some time forgetful conversation and does not remember his medication dosage and directions.  He believes his depression is overall stable on Wellbutrin and Celexa and since he stopped latuda he no longer having any nightmares and bad dreams.  However he is still feels sometimes very anxious and having chest pain but he has seen cardiologist few times and his EKG was normal.  He feels sometimes very tired week and lack of motivation to do many things.  He is not sure how long he will continue to work.  I have seen rapid decline in his physical impairment in recent months.  Patient and his wife has discussion about filing disability but patient like to work if he can handle his job without any problem.  His wife is very supportive.  Patient denies any anger, paranoia, hallucination or any crying spells.  He denies drinking or using any illegal substances.  He appears physically tired and he is slow to answer.  He reported his appetite is okay and his  vitals are stable.  Patient is working at [employer] for many years.  Suicidal Ideation: No Plan Formed: No Patient has means to carry out plan: No  Homicidal Ideation: No Plan Formed: No Patient has means to carry out plan: No  Review of Systems  Neurological: Positive for tremors.  Psychiatric/Behavioral: Positive for depression and memory loss. The patient is nervous/anxious.    Psychiatric: Agitation: No Hallucination: No Depressed Mood: Yes Insomnia: No Hypersomnia: Yes Altered Concentration: No Feels Worthless: No Grandiose Ideas: No Belief In Special Powers: No New/Increased Substance Abuse: No Compulsions: No  Neurologic: Headache: No Seizure: No Paresthesias: No  Past Medical History: Hypogonadism  Social history. Patient is employed and married.  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  Outpatient Encounter Prescriptions as of 12/04/2014  Medication Sig  . B Complex-C (B-COMPLEX WITH VITAMIN C) tablet Take 1 tablet by mouth daily.  Marland Kitchen buPROPion (WELLBUTRIN XL) 300 MG 24 hr tablet Take 1 tablet (300 mg total) by mouth every morning.  . citalopram (CELEXA) 40 MG tablet Take 1 tablet (40 mg total) by mouth daily.  . Omega 3-6-9 Fatty Acids (OMEGA 3-6-9 PO) Take by mouth.  . pramipexole (MIRAPEX) 0.125 MG tablet Take 1 tablet TID for one week, then 2 tablets TID for one week, then switch to 0.5 mg tablets  . pramipexole (  MIRAPEX) 0.5 MG tablet Take 1 tablet (0.5 mg total) by mouth 3 (three) times daily.  . TURMERIC CURCUMIN PO Take by mouth.  . [DISCONTINUED] buPROPion (WELLBUTRIN XL) 300 MG 24 hr tablet Take 1 tablet (300 mg total) by mouth every morning.  . [DISCONTINUED] citalopram (CELEXA) 40 MG tablet Take 1 tablet (40 mg total) by mouth daily.    Past Psychiatric History/Hospitalization(s): Patient denies any history of suicidal attempt or any inpatient psychiatric treatment.  He denies any history of mania, psychosis, paranoia or any  hallucination.  In the past he had tried Effexor, Abilify, Paxil, Cymbalta, Zoloft, amitriptyline, Pristiq, Britnellex, Celexa, Deplin and Vyvanse.  He is seen in this office since September 2009.  He has seen multiple psychiatrists in the past.  Anxiety: No Bipolar Disorder: No Depression: Yes Mania: No Psychosis: No Schizophrenia: No Personality Disorder: No Hospitalization for psychiatric illness: No History of Electroconvulsive Shock Therapy: No Prior Suicide Attempts: No  Physical Exam: Constitutional:  BP 115/76 mmHg  Pulse 80  Ht 6\' 4"  (1.93 m)  Wt 244 lb 9.6 oz (110.95 kg)  BMI 29.79 kg/m2  No results found for this or any previous visit (from the past 2160 hour(s)).  General Appearance: well nourished  Musculoskeletal: Strength & Muscle Tone: decreased Gait & Station: unsteady Patient leans: N/A   Mental Status Examination: Patient is casually dressed and fairly groomed.  He maintained fair eye contact.  His speech is slow with decreased volume and tone.  His response to the question is slow.  His thought process is slow but coherent.  He has sometimes thought blocking .  His affect is flat.  He denies any auditory or visual hallucination.  He denies any active or passive suicidal thoughts and homicidal thoughts.  There were no delusions or any paranoia.  His attention and concentration is fair. He described his mood anxious and his affect is flat.  He has some difficulty remembering things but he is alert and oriented 3.  He has mild tremors in his hands and his legs. His psychomotor activity is decreased.  His fund of knowledge is average.  His insight judgment and impulse control is okay.  Established Problem, Stable/Improving (1), Review of Psycho-Social Stressors (1), Review and summation of old records (2), New Problem, with no additional work-up planned (3), Review of Last Therapy Session (1), Review of Medication Regimen & Side Effects (2) and Review of New  Medication or Change in Dosage (2)  Assessment: Axis I: Maj. depressive disorder, recurrent, severe  Axis II: Deferred  Axis III: Hypogonadism, Parkinson   Plan:  Patient is no longer taking latuda and he does not feel his depression is getting worse.  Actually he is feeling better since he does not have any nightmares and dreams.  He wants to continue Celexa 40 mg daily and Wellbutrin XL 300 mg daily.  He still have anxiety and some time chest pain which could be panic attack if cardiac workup is negative but patient does not want to take any more medication at this time.  We discussed if his anxiety continues to get worse then we will consider increasing Wellbutrin.  Patient has FMLA papers signed by his neurologist.  I encourage him to discuss with his neurologist about long-term prognosis due to his physical limitation and worsening of Parkinson symptoms.  His memory impairment is getting divorced which could be factor that limit his employment.  Recommended to call us back if he has any question, concern if he  feel worsening of the symptom.  I will see him again in 2 months.  Patient and his wife agree with the plan.  I will see him again in 6 weeks. Time spent 25 minutes.  More than 50% of the time spent in psychoeducation, counseling and coordination of care.  Discuss safety plan that anytime having active suicidal thoughts or homicidal thoughts then patient need to call 911 or go to the local emergency room.   Tymeir Weathington T., MD 12/04/2014

## 2014-12-04 NOTE — Therapy (Signed)
[facility] [address] [place], [place], [address] Phone: [phone]   Fax:  [phone]  Speech Language Pathology Evaluation  Patient Details  Name: TILER BRANDIS MRN: [id number] Date of Birth: Jul 26, 1951 Referring Provider:  Olga Millers, MD  Encounter Date: 12/04/2014      End of Session - 12/04/14 1303    Visit Number 1   Number of Visits 16   Date for SLP Re-Evaluation 01/29/15   Authorization Type 75 visits combined   SLP Start Time 6270   SLP Stop Time  1303   SLP Time Calculation (min) 43 min   Activity Tolerance Patient tolerated treatment well      Past Medical History  Diagnosis Date  . Headache(784.0)   . Cardiac murmur     as a child  . Streptococcal meningitis     as an infant  . Depression   .  OSA (obstructive sleep apnea) 01/17/2011    npsg 2012:  AHI 67/hr. Auto titration 2012:  Optimal pressure 12cm.   Marland Kitchen HEADACHES, HX OF 02/18/2008    Qualifier: Diagnosis of  By: Danny Lawless CMA, [place]    . APPENDECTOMY, HX OF 02/18/2008    Qualifier: Diagnosis of  By: Danny Lawless CMA, [place]    . Parkinson disease 11/2014    Past Surgical History  Procedure Laterality Date  . Nasal sinus surgery      x 4 as a child  . Vasectomy    . Appendectomy  1967    There were no vitals taken for this visit.  Visit Diagnosis: Hypokinetic Parkinsonian dysphonia - Plan: SLP plan of care cert/re-cert      Subjective Assessment - 12/04/14 1234    Symptoms "I don't know why I am here"   Currently in Pain? No/denies          SLP Evaluation [facility] - 12/04/14 1234    SLP Visit Information   SLP Received On 12/04/14   Onset Date 6 weeks ago   Medical Diagnosis PD   Subjective   Patient/Family Stated Goal "To be able to speak more clearly with higher volume"   General Information   Mobility Status independently   Prior Functional Status   Cognitive/Linguistic Baseline Baseline deficits   Baseline  deficit details memory loss   Vocation Full time employment   Cognition   Overall Cognitive Status Impaired/Different from baseline   Area of Impairment Memory   Memory Decreased short-term memory   Oral Motor/Sensory Function   Overall Oral Motor/Sensory Function Appears within functional limits for tasks assessed   Motor Speech   Overall Motor Speech Impaired   Respiration Impaired   Level of Impairment Word   Phonation Hoarse;Low vocal intensity   Intelligibility Intelligibility reduced   Conversation 25-49% accurate   Motor Speech Errors Unaware   Effective Techniques Increased vocal intensity           ADULT SLP TREATMENT - 12/04/14 1316    Cognitive-Linquistic Treatment   Skilled Treatment Pt trained in abdominal breathing with usual moderate A. He achieved average of 73dB on oral reading short sentences and breathing before each sentence with usual min verball and visual cues. Initiated training in "think loud, think shout" with usual mod A.          SLP Education - 12/04/14 1301    Education provided Yes   Education Details symptoms of hypokinetic dysarthria, HEP (loud /a/), abdominal breathing, Think loud!   Person(s) Educated Patient;Spouse  Methods Explanation;Demonstration;Handout   Comprehension Verbalized understanding          SLP Short Term Goals - 12/04/14 1311    SLP SHORT TERM GOAL #1   Title Pt will demonstrate abdominal breathing in structured simple conversation for 10 minutes with occassional minimal cues.12/29/14   Time 4   Period Weeks   Status New   SLP SHORT TERM GOAL #2   Title Pt will demonstrate average of 84dB for sustained /a/ over 3 sessions with rare minimal assistance (12/29/14)   Time 4   Period Weeks   Status New   SLP SHORT TERM GOAL #3   Title Pt will average 70dB in simple structured speech tasks over 20 minutes with occassional minimal assistance (12/29/14)   Time 4   Period Weeks          SLP Long Term Goals -  12/04/14 1313    SLP LONG TERM GOAL #1   Title Demonstrate abdominal breathing for mildly complex conversation for 15 minutes with occassional minimal cues (01/29/15)   Time 8   Period Weeks   Status New   SLP LONG TERM GOAL #2   Title Pt will maintain an average of 70dBfor mildly complex conversation over 15 minutes with rare minimal assistance (01/29/15)   Time 8   Period Weeks   Status New   SLP LONG TERM GOAL #3   Title Pt will converse audibly in noisy environment for 15 minutes with rare minimal assistance (01/29/15)   Time 8   Period Weeks   Status New          Plan - 12/04/14 1304    Clinical Impression Statement 64 y.o. male dx with PD 6 weeks ago, with symptoms for 1 year. Pt presents with moderate hypokinetic dysarthria. Average conversation volume as measured by sound level meter 30cm from mouth is 65dB., with 70dB being [facility]. Spouse reports she frequently to consistently has to ask Mr Loya to repeat himself at home. His sutained /a/ is 71dB. Pt is noted to have shallow breathing. WHen trained to take deep abdmonial breaths and give loud /a/, his volume incrased to 80dB.  Mr. Shea Stakes is also noted to have a hoarse voice. He is judged to be 75% intellgibile in quiet environment, however Mrs. Neutsel rated her husband to be less than 50% intelligibile at home. 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 He does note have to talk much at work. He presents with flat affect and monotone speech which also affects his communication.  Mr. Shea Stakes verbalized reduced awareness of his speech  Difficulties and flat affect. I recommend skilled ST to maxmize intelligibility and reduce caregiver burden   Duration --  8 weeks   Treatment/Interventions Compensatory strategies;Functional tasks;Patient/family education;SLP instruction and feedback;Internal/external aids   Potential to Achieve Goals Fair   Potential Considerations Severity of impairments   Consulted and Agree with Plan  of Care Patient;Family member/caregiver        Problem List Patient Active Problem List   Diagnosis Date Noted  . Sinus disease 07/28/2014  . MDD (major depressive disorder), recurrent episode, severe 02/10/2014  . Nonspecific abnormal electrocardiogram (ECG) (EKG) 02/07/2014  . Non-compliant behavior 02/03/2014  . Other abnormal glucose 10/02/2013  . Morton's neuroma of left foot 08/07/2013  . Stress fracture of left foot 07/17/2013  . 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 Major depressive disorder, recurrent episode, severe 03/13/2012  . Hyperglycemia 01/26/2012  . Hypogonadism male 01/24/2012  .  Syncope and collapse 01/20/2012  .  OSA (obstructive sleep apnea) 01/17/2011  . CHEST TIGHTNESS 02/18/2008    Lovvorn, Annye Rusk, SLP 12/04/2014, 2:46 PM  [facility] [address] [place], [place], [address] Phone: [phone]   Fax:  [phone]

## 2014-12-08 ENCOUNTER — Ambulatory Visit: Payer: BLUE CROSS/BLUE SHIELD

## 2014-12-08 ENCOUNTER — Telehealth: Payer: Self-pay | Admitting: *Deleted

## 2014-12-08 DIAGNOSIS — G20 Parkinson's disease: Secondary | ICD-10-CM | POA: Diagnosis not present

## 2014-12-08 DIAGNOSIS — R490 Dysphonia: Secondary | ICD-10-CM

## 2014-12-08 MED ORDER — ROPINIROLE HCL 1 MG PO TABS: 1.0000 mg | ORAL_TABLET | Freq: Three times a day (TID) | ORAL | Status: DC

## 2014-12-08 NOTE — Telephone Encounter (Signed)
Patient calling not feeling well this morning a lot more shaky then before. C/b [phone]

## 2014-12-08 NOTE — Telephone Encounter (Signed)
No.  Can change to requip 1mg  tid without titrating (make sure that they know that these aren't the same drug and not same dosages so that they don't call back thinking that I made a mistake)

## 2014-12-08 NOTE — Telephone Encounter (Signed)
Spoke with patient and he states since he has started taking the 0.5 mg Mirapex TID (last Thurday) his tremors have gotten a lot worse then before. They are no longer just in hands, but he is having them in his arms and legs. He is having a worse time with some motor functions like zipping zippers and tying shoes. He has also had an issue with falling asleep suddenly happening daily for the last couple of days, even at work. Please advise.

## 2014-12-08 NOTE — Telephone Encounter (Signed)
Patient's wife made aware. Also wanted to let you know that he is currently doing PT/OT/ST and the [facility] class. They would like to switch to Requip. What dose would you like to switch him to and would he need to titrate or just switch since he is currently taking the Mirapex?

## 2014-12-08 NOTE — Therapy (Signed)
[facility] [address] [place], [place], [address] Phone: [phone]   Fax:  [phone]  Speech Language Pathology Treatment  Patient Details  Name: [name] [name] MRN: [id number] Date of Birth: 07-06-1951 Referring Provider:  Olga Millers, MD  Encounter Date: 12/08/2014      End of Session - 12/08/14 1155    Visit Number 2   Number of Visits 16   Date for SLP Re-Evaluation 01/29/15   SLP Start Time 0804   SLP Stop Time  0845   SLP Time Calculation (min) 41 min   Activity Tolerance Patient tolerated treatment well      Past Medical History  Diagnosis Date  . Headache(784.0)   . Cardiac murmur     as a child  . Streptococcal meningitis     as an infant  . Depression   .  OSA (obstructive sleep apnea) 01/17/2011    npsg 2012:  AHI 67/hr. Auto titration 2012:  Optimal pressure 12cm.   Marland Kitchen HEADACHES, HX OF 02/18/2008    Qualifier: Diagnosis of  By: Danny Lawless CMA, [place]    . APPENDECTOMY, HX OF 02/18/2008    Qualifier: Diagnosis of  By: Danny Lawless CMA, [place]    . Parkinson disease 11/2014    Past Surgical History  Procedure Laterality Date  . Nasal sinus surgery      x 4 as a child  . Vasectomy    . Appendectomy  1967    There were no vitals taken for this visit.  Visit Diagnosis: Hypokinetic Parkinsonian dysphonia      Subjective Assessment - 12/08/14 0847    Symptoms "I did" (re: did pt practice loud /a/ at home)             ADULT SLP TREATMENT - 12/08/14 0847    General Information   Behavior/Cognition Alert;Cooperative;Pleasant mood   Treatment Provided   Treatment provided Cognitive-Linquistic   Pain Assessment   Pain Assessment No/denies pain   Cognitive-Linquistic Treatment   Treatment focused on Dysarthria   Skilled Treatment Pt with loud /a/ with average 82dB and occasional min-mod A for remaining loud throughout the /a/. By end of 5 reps, cues faded to min A rarely. SLP  facilitated practice at sentence level responses with average 70dB and rare min A. SLP further shaped pt's resopiration with loud /a/ as pt told SLP hewas "light headed". Pt needed A with making steeper breath to begin loud /a/.    Assessment / Recommendations / Plan   Plan Continue with current plan of care   Progression Toward Goals   Progression toward goals Progressing toward goals          SLP Education - 12/08/14 0852    Education provided Yes   Education Details loud /a/ breathing   Person(s) Educated Patient   Methods Explanation   Comprehension Verbalized understanding;Returned demonstration          SLP Short Term Goals - 12/08/14 1156    SLP SHORT TERM GOAL #1   Title Pt will demonstrate abdominal breathing in structured simple conversation for 10 minutes with occassional minimal cues.12/29/14   Time 4   Period Weeks   Status On-going   SLP SHORT TERM GOAL #2   Title Pt will demonstrate average of 84dB for sustained /a/ over 3 sessions with rare minimal assistance (12/29/14)   Time 4   Period Weeks   Status On-going   SLP SHORT TERM GOAL #3   Title  Pt will average 70dB in simple structured speech tasks over 20 minutes with occassional minimal assistance (12/29/14)   Time 4   Period Weeks   Status On-going          SLP Long Term Goals - 12/08/14 1156    SLP LONG TERM GOAL #1   Title Demonstrate abdominal breathing for mildly complex conversation for 15 minutes with occassional minimal cues (01/29/15)   Time 8   Period Weeks   Status On-going   SLP LONG TERM GOAL #2   Title Pt will maintain an average of 70dBfor mildly complex conversation over 15 minutes with rare minimal assistance (01/29/15)   Time 8   Period Weeks   Status On-going   SLP LONG TERM GOAL #3   Title Pt will converse audibly in noisy environment for 15 minutes with rare minimal assistance (01/29/15)   Time 8   Period Weeks   Status On-going          Plan - 12/08/14 1155    Clinical  Impression Statement Pt with very flat affect. Showed success today with structured speech tasks, but no carryover into spontaneous conversational speech.   Speech Therapy Frequency 2x / week   Duration --  7 weeks   Treatment/Interventions Compensatory strategies;Functional tasks;Patient/family education;SLP instruction and feedback;Internal/external aids   Potential to Achieve Goals Fair   Potential Considerations Severity of impairments        Problem List Patient Active Problem List   Diagnosis Date Noted  . Sinus disease 07/28/2014  . MDD (major depressive disorder), recurrent episode, severe 02/10/2014  . Nonspecific abnormal electrocardiogram (ECG) (EKG) 02/07/2014  . Non-compliant behavior 02/03/2014  . Other abnormal glucose 10/02/2013  . Morton's neuroma of left foot 08/07/2013  . Stress fracture of left foot 07/17/2013  . 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 Major depressive disorder, recurrent episode, severe 03/13/2012  . Hyperglycemia 01/26/2012  . Hypogonadism male 01/24/2012  . Syncope and collapse 01/20/2012  .  OSA (obstructive sleep apnea) 01/17/2011  . CHEST TIGHTNESS 02/18/2008    [place], SLP 12/08/2014, 11:57 AM  [facility] [address] [place], [place], [address] Phone: [phone]   Fax:  [phone]

## 2014-12-08 NOTE — Telephone Encounter (Signed)
I cannot think of any reason why the mirapex would make tremor worse.  I can see how it could contribute to fatigue.  I remember well that his wife wanted him on levodopa and I was a little resistant as a first drug at his age.  Is he wanting to d/c the mirapex?  If so, we could try requip instead

## 2014-12-08 NOTE — Telephone Encounter (Signed)
Patient's wife made aware and RX sent to pharmacy.

## 2014-12-09 ENCOUNTER — Ambulatory Visit: Payer: BLUE CROSS/BLUE SHIELD

## 2014-12-09 ENCOUNTER — Ambulatory Visit: Payer: BLUE CROSS/BLUE SHIELD | Admitting: Occupational Therapy

## 2014-12-09 DIAGNOSIS — R258 Other abnormal involuntary movements: Secondary | ICD-10-CM

## 2014-12-09 DIAGNOSIS — R279 Unspecified lack of coordination: Secondary | ICD-10-CM

## 2014-12-09 DIAGNOSIS — R293 Abnormal posture: Secondary | ICD-10-CM

## 2014-12-09 DIAGNOSIS — Z7409 Other reduced mobility: Secondary | ICD-10-CM

## 2014-12-09 DIAGNOSIS — R29898 Other symptoms and signs involving the musculoskeletal system: Secondary | ICD-10-CM

## 2014-12-09 DIAGNOSIS — G20 Parkinson's disease: Secondary | ICD-10-CM | POA: Diagnosis not present

## 2014-12-09 NOTE — Patient Instructions (Signed)
Tips to reduce freezing episodes with standing or walking:  1. Stand tall with your feet wide, so that you can rock and weight shift through your hips. 2. Don't try to fight the freeze: if you begin taking slower, faster, smaller steps, STOP, get your posture tall, and RESET your posture and balance.  Take a deep breath before taking the BIG step to start again. 3. March in place, with high knee stepping, to get started walking again. 4. Use auditory cues:  Count out loud, think of a familiar tune or song or cadence, use pocket metronome, to use rhythm to get started walking again. 5. Use visual cues:  Use a line to step over, use laser pointer line to step over, (using BIG steps) to start walking again. 6. Use visual targets to keep your posture tall (look ahead and focus on an object or target at eye level). 7. As you approach where your destination with walking, count your steps out loud and/or focus on your target with your eyes until you are fully there. 8. Use appropriate assistive device, as advised by your physical therapist to assist with taking longer, consistent steps.     Knee Extension: Sit to Stand (Eccentric)   Sit in a chair, lean far forward and reach hands over head as you stand up tall, hands high and fingers wide. Perform 10x making sure to keep your weight even on your feet so you aren't falling backwards on your heels   .  Copyright  VHI. All rights reserved.

## 2014-12-09 NOTE — Therapy (Addendum)
[facility] [address] [place], [place], [address] Phone: [phone]   Fax:  [phone]  Occupational Therapy Treatment  Patient Details  Name: [name] [name] MRN: [id number] Date of Birth: 1950/12/23 Referring Provider:  Olga Millers, MD  Encounter Date: 12/09/2014      OT End of Session - 12/09/14 0851    Visit Number 2   Number of Visits 17   Date for OT Re-Evaluation 01/29/15   Authorization Type BC[employer] 75 visit limit combined, no auth   OT Start Time [phone]   OT Stop Time 0930   OT Time Calculation (min) 39 min   Activity Tolerance Patient tolerated treatment well   Behavior During Therapy [facility] for tasks assessed/performed      Past Medical History  Diagnosis Date  . Headache(784.0)   . Cardiac murmur     as a child  . Streptococcal meningitis     as an infant  . Depression   .  OSA (obstructive sleep apnea) 01/17/2011    npsg 2012:  AHI 67/hr. Auto titration 2012:  Optimal pressure 12cm.   Marland Kitchen HEADACHES, HX OF 02/18/2008    Qualifier: Diagnosis of  By: Danny Lawless CMA, [place]    . APPENDECTOMY, HX OF 02/18/2008    Qualifier: Diagnosis of  By: Danny Lawless CMA, [place]    . Parkinson disease 11/2014    Past Surgical History  Procedure Laterality Date  . Nasal sinus surgery      x 4 as a child  . Vasectomy    . Appendectomy  1967    There were no vitals taken for this visit.  Visit Diagnosis:  Bradykinesia  Rigidity  Lack of coordination  Abnormal posture      Subjective Assessment - 12/09/14 0851    Symptoms Pt denies pain   Pertinent History Diagnosed with Parkinson's approx 1 month ago.   Currently in Pain? No/denies                 OT Treatments/Exercises (OP) - 12/09/14 0001    Fine Motor Coordination   Fine Motor Coordination Flipping cards;Dealing card with thumb;Tossing ball  rotating ball   Flipping cards min v.c. for big movement, bilaterally   Dealing card with  thumb min v.c for big movement bilaterally   Tossing ball between hands, min v.c./ min difficulty   Neurological Re-education Exercises   Reciprocal Movements Arm bike x 6 mins level 1 for conditioning min v.c. for speed, pt able to maintain 38-42 RPM           [facility]) - 12/09/14 0916    PWR! exercises Moves in sitting   PWR! Up 20 reps min v.c./ demonstration   PWR! Rock 20 reps min v.c./ demonstration   PWR! Twist 20 reps min v.c. /demonstration   PWR! Step 10 reps min vc. /demonstration   Comments therapist issued handout for homework             OT Education - 12/09/14 1243    Education provided Yes   Education Details PWR! moves seated basic 4   Person(s) Educated Patient   Methods Explanation;Demonstration;Verbal cues;Handout   Comprehension Verbalized understanding;Returned demonstration;Verbal cues required          OT Short Term Goals - 12/01/14 0859    OT SHORT TERM GOAL #1   Title Pt will be independent with HEP--due 12/30/14   Time 4   Period Weeks   Status New   OT  SHORT TERM GOAL #2   Title Pt will verbalize understanding of PD symptoms and ways to prevent future complications.--due 12/30/14   Time 4   Period Weeks   Status New   OT SHORT TERM GOAL #3   Title Pt will verbalize understanding of PD-specific appropriate community resources.--due 12/30/14   Time 4   Period Weeks   Status New   OT SHORT TERM GOAL #4   Title Pt will complete dressing fasteners mod I using strategies/AE prn.--due 12/30/14   Time 4   Period Weeks   Status New   OT SHORT TERM GOAL #5   Title Pt will improve coordination/functional reaching as shown by improving score on box and blocks test by at least 10 with RUE.--due 12/30/14   Baseline 37   Time 4   Period Weeks   Status New           OT Long Term Goals - 12/01/14 7824    OT LONG TERM GOAL #1   Title Pt will verbalize understanding of AE/strategies for ADLs/IADLs prn.--due 01/29/15   Time 8   Period Weeks    Status New   OT LONG TERM GOAL #2   Title Pt will improve coordination for ADLs as shown by improving time on 9-hole peg test by at least 5 bilaterally.--due 01/29/15   Baseline R-32.59sec, L-35.66sec   Time 8   Period Weeks   Status New   OT LONG TERM GOAL #3   Title Pt will improve coordination/functional reaching as shown by improving score on box and blocks test by at least 5 with LUE.--due 01/29/15   Baseline 45   Time 8   Period Weeks   Status New   OT LONG TERM GOAL #4   Title Pt will improve coordination/functional reaching as shown by improving score on box and blocks test by at least 10 with RUE.--due 01/29/15   Baseline 37   Time 8   Period Weeks   Status New   OT LONG TERM GOAL #5   Title Pt will improve dressing ability as shown by improving time on PPT#4 by at least 5sec.--due 01/29/15   Baseline 24.53sec   Time 8   Period Weeks   Status New   Long Term Additional Goals   Additional Long Term Goals Yes   OT LONG TERM GOAL #6   Title Pt will improve bradykinesia as shown by improving time on PPT#2 by at least 5sec.--due 01/29/15   Baseline 15.35   Time 8   Period Weeks   Status New               Plan - 12/09/14 1240    Clinical Impression Statement Pt is progressing towards goals. He requires verbal cues for large amplitude movements with exercises.   Pt will benefit from skilled therapeutic intervention in order to improve on the following deficits (Retired) Decreased coordination;Decreased endurance;Impaired tone;Decreased activity tolerance;Decreased balance;Decreased knowledge of use of DME;Impaired UE functional use;Decreased cognition;Decreased mobility;Impaired perceived functional ability   Plan Reinforce PWR! seated, issue coordination HEP.   OT Home Exercise Plan PWR! moves seated   Consulted and Agree with Plan of Care Patient     Pt reports dizziness today and reports that Dr. Carles Collet changed his dosage of a medication. Therapist recommended pt  call Dr. Carles Collet if dizziness persists. BP= 112/71.   Problem List Patient Active Problem List   Diagnosis Date Noted  . Sinus disease 07/28/2014  . MDD (major depressive disorder), recurrent  episode, severe 02/10/2014  . Nonspecific abnormal electrocardiogram (ECG) (EKG) 02/07/2014  . Non-compliant behavior 02/03/2014  . Other abnormal glucose 10/02/2013  . Morton's neuroma of left foot 08/07/2013  . Stress fracture of left foot 07/17/2013  . 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 Major depressive disorder, recurrent episode, severe 03/13/2012  . Hyperglycemia 01/26/2012  . Hypogonadism male 01/24/2012  . Syncope and collapse 01/20/2012  .  OSA (obstructive sleep apnea) 01/17/2011  . CHEST TIGHTNESS 02/18/2008    RINE,KATHRYN 12/09/2014, 12:43 PM Theone Murdoch, OTR/L [phone] Phone: [phone] 12:43 PM 12/09/2014 [facility] [address] [place], [place], [address] Phone: [phone]   Fax:  [phone]

## 2014-12-09 NOTE — Therapy (Signed)
[facility] [address] [place], [place], [address] Phone: [phone]   Fax:  [phone]  Physical Therapy Treatment  Patient Details  Name: [name] [name] MRN: [id number] Date of Birth: 12/09/1950 Referring Provider:  Olga Millers, MD  Encounter Date: 12/09/2014      PT End of Session - 12/10/14 1726    Visit Number 2   Number of Visits 17   Date for PT Re-Evaluation 01/30/15   Authorization Type [employer]BS 75 visit limit combined   PT Start Time 0930   PT Stop Time 1015   PT Time Calculation (min) 45 min      Past Medical History  Diagnosis Date  . Headache(784.0)   . Cardiac murmur     as a child  . Streptococcal meningitis     as an infant  . Depression   .  OSA (obstructive sleep apnea) 01/17/2011    npsg 2012:  AHI 67/hr. Auto titration 2012:  Optimal pressure 12cm.   Marland Kitchen HEADACHES, HX OF 02/18/2008    Qualifier: Diagnosis of  By: Danny Lawless CMA, [place]    . APPENDECTOMY, HX OF 02/18/2008    Qualifier: Diagnosis of  By: Danny Lawless CMA, [place]    . Parkinson disease 11/2014    Past Surgical History  Procedure Laterality Date  . Nasal sinus surgery      x 4 as a child  . Vasectomy    . Appendectomy  1967    There were no vitals taken for this visit.  Visit Diagnosis:  Bradykinesia  Lack of coordination  Abnormal posture  Decreased functional mobility and endurance      Subjective Assessment - 12/10/14 0816    Symptoms Feel fine today; just running late due to traffic   Currently in Pain? No/denies      Taught [employer]; low floor supine exercises. Pt performed x10 of each: PWR UP for posture; PWR! Rock ro weight shift, PWR! Twist for trunk rotation and PWR! Step for transitional movement. Verbal cues required for intensity of movement, and for tips for proper coordination. Exercises also improve efficiency of bed mobility.  Performed sit to stands x10 with large amplitude BUE  movement (reaching overhead)  Discussed Walking program education.  Discussed Freezing strategies.                 [facility]) - 12/10/14 8101    PWR! exercises Moves in sitting;Moves in standing   PWR! Up 20   PWR! Rock 20   PWR! Twist 20   PWR! Step 20   PWR! Up 10   PWR! Rock 10   PWR! Twist 10   PWR Step 10             PT Education - 12/10/14 1725    Education provided Yes   Education Details PWR! Supine HEP and Sit to stand with large amplitude UE movement, and tips to avoid freezing   Person(s) Educated Patient   Methods Explanation;Demonstration;Handout;Tactile cues;Verbal cues   Comprehension Verbalized understanding;Returned demonstration          PT Short Term Goals - 12/02/14 0820    PT SHORT TERM GOAL #1   Title Pt will be independent with HEP for improved transfers, balance, and gait. (Target 12/30/14)   Time 4   Period Weeks   Status New   PT SHORT TERM GOAL #2   Title Pt will improve 5x sit<>stand to less than or equal to 11.5 seconds  for improved transfer efficiency and safety.   Baseline 16.14 sec   Time 4   Period Weeks   Status New   PT SHORT TERM GOAL #3   Title Pt will improve Functional Gait Assessment score to at least 18/30 for decreased fall risk.   Baseline 14/30   Time 4   Period Weeks   Status New   PT SHORT TERM GOAL #4   Title Pt will improve TUG score to less than or equal to 13.5 seconds for decreased fall risk.   Baseline TUG 19.16 sec   Time 4   Period Weeks   Status New   PT SHORT TERM GOAL #5   Title Pt will verbalize understanding of local Parkinson's-disease related resources.   Time 4   Period Weeks   Status New           PT Long Term Goals - 12/02/14 4709    PT LONG TERM GOAL #1   Title Pt will verbalize understanding of fall prevention techniques within the home environment. (Target 01/30/15)   Time 8   Period Weeks   Status New   PT LONG TERM GOAL #2   Title Pt will improve gait velocity  to at least 2.62 ft/sec for improved gait efficiency and safety.   Time 8   Period Weeks   Status New   PT LONG TERM GOAL #3   Title Pt will improve Functional Gait Assessment to at least 22/30 for decreased fall risk.   Time 8   Period Weeks   Status New   PT LONG TERM GOAL #4   Title Pt will improve TUG cognitive score to less than or equal to 15 seconds for decreased fall risk.   Time 8   Period Weeks   Status New   PT LONG TERM GOAL #5   Title Pt will verbalize plans for continued community fitness upon D/C from PT.   Time 8   Period Weeks   Status New               Plan - 12/10/14 1727    Clinical Impression Statement Pt demonstrates impaired balance and notable impairment in functional mobility. He required verbal and tactile cues initially for PWR supine exercises but was able to eventually perform them without assistance. Continue per POC   PT Next Visit Plan Review PWR supine exercises and add standing PWR.        Problem List Patient Active Problem List   Diagnosis Date Noted  . Sinus disease 07/28/2014  . MDD (major depressive disorder), recurrent episode, severe 02/10/2014  . Nonspecific abnormal electrocardiogram (ECG) (EKG) 02/07/2014  . Non-compliant behavior 02/03/2014  . Other abnormal glucose 10/02/2013  . Morton's neuroma of left foot 08/07/2013  . Stress fracture of left foot 07/17/2013  . 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 Major depressive disorder, recurrent episode, severe 03/13/2012  . Hyperglycemia 01/26/2012  . Hypogonadism male 01/24/2012  . Syncope and collapse 01/20/2012  .  OSA (obstructive sleep apnea) 01/17/2011  . CHEST TIGHTNESS 02/18/2008    Delrae Sawyers, PT,DPT,NCS 12/10/2014 5:34 PM Phone [phone] FAX [phone]         [facility] [address] [place], [place], [address] Phone: [phone]   Fax:   [phone]

## 2014-12-10 ENCOUNTER — Ambulatory Visit: Payer: BLUE CROSS/BLUE SHIELD | Admitting: Physical Therapy

## 2014-12-10 ENCOUNTER — Ambulatory Visit: Payer: BLUE CROSS/BLUE SHIELD

## 2014-12-10 DIAGNOSIS — R258 Other abnormal involuntary movements: Secondary | ICD-10-CM

## 2014-12-10 DIAGNOSIS — R269 Unspecified abnormalities of gait and mobility: Secondary | ICD-10-CM

## 2014-12-10 DIAGNOSIS — G20 Parkinson's disease: Secondary | ICD-10-CM | POA: Diagnosis not present

## 2014-12-10 DIAGNOSIS — R490 Dysphonia: Secondary | ICD-10-CM

## 2014-12-10 NOTE — Therapy (Signed)
[facility] [address] [place], [place], [address] Phone: [phone]   Fax:  [phone]  Speech Language Pathology Treatment  Patient Details  Name: [name] [name] MRN: [id number] Date of Birth: 08/15/1951 Referring Provider:  Olga Millers, MD  Encounter Date: 12/10/2014      End of Session - 12/10/14 0931    Visit Number 3   Number of Visits 16   Date for SLP Re-Evaluation 01/29/15   SLP Start Time 0848   SLP Stop Time  0931   SLP Time Calculation (min) 43 min   Activity Tolerance Patient tolerated treatment well      Past Medical History  Diagnosis Date  . Headache(784.0)   . Cardiac murmur     as a child  . Streptococcal meningitis     as an infant  . Depression   .  OSA (obstructive sleep apnea) 01/17/2011    npsg 2012:  AHI 67/hr. Auto titration 2012:  Optimal pressure 12cm.   Marland Kitchen HEADACHES, HX OF 02/18/2008    Qualifier: Diagnosis of  By: Danny Lawless CMA, [place]    . APPENDECTOMY, HX OF 02/18/2008    Qualifier: Diagnosis of  By: Danny Lawless CMA, [place]    . Parkinson disease 11/2014    Past Surgical History  Procedure Laterality Date  . Nasal sinus surgery      x 4 as a child  . Vasectomy    . Appendectomy  1967    There were no vitals taken for this visit.  Visit Diagnosis: Hypokinetic Parkinsonian dysphonia      Subjective Assessment - 12/10/14 0849    Symptoms "Not twice a day, but I'm working on it."             ADULT SLP TREATMENT - 12/10/14 0850    General Information   Behavior/Cognition Alert;Cooperative;Pleasant mood;Other (comment)  very flat affect   Treatment Provided   Treatment provided Cognitive-Linquistic   Pain Assessment   Pain Assessment No/denies pain   Cognitive-Linquistic Treatment   Treatment focused on Dysarthria   Skilled Treatment Pt with mod difficulty with muscle knowledge of abdominal push for loud "ah" with WNL voicing. Trained pt with loud  "HEY!" which was with WNL voicing. SLP facilitated pt loud /a/ production using initial "HEY!" to train muscles for appropriate push for WNL voicing. When SLP trained pt using this technique, pt's awareness of WNL voicing improved and he was approx 40% successful with WNL voicing for loud /a/. SLP modified pt's home exercises to have loud "HEY!" reps instead of loud /a/ reps, currently.  Long discussion about post-therapy need to continue exercises.    Assessment / Recommendations / Plan   Plan Continue with current plan of care   Progression Toward Goals   Progression toward goals Progressing toward goals          SLP Education - 12/10/14 0929    Education Details Necessity to copmlete HEPs after d/c, HEP for facial muscles   Person(s) Educated Patient   Methods Explanation;Demonstration;Verbal cues;Handout   Comprehension Verbalized understanding;Returned demonstration;Verbal cues required          SLP Short Term Goals - 12/10/14 0933    SLP SHORT TERM GOAL #1   Title Pt will demonstrate abdominal breathing in structured simple conversation for 10 minutes with occassional minimal cues.12/29/14   Time 4   Period Weeks   Status On-going   SLP SHORT TERM GOAL #2   Title Pt will demonstrate average  of 84dB for sustained /a/ over 3 sessions with rare minimal assistance (12/29/14)   Time 4   Period Weeks   Status On-going   SLP SHORT TERM GOAL #3   Title Pt will average 70dB in simple structured speech tasks over 20 minutes with occassional minimal assistance (12/29/14)   Time 4   Period Weeks   Status On-going          SLP Long Term Goals - 12/10/14 0934    SLP LONG TERM GOAL #1   Title Demonstrate abdominal breathing for mildly complex conversation for 15 minutes with occassional minimal cues (01/29/15)   Time 8   Period Weeks   Status On-going   SLP LONG TERM GOAL #2   Title Pt will maintain an average of 70dBfor mildly complex conversation over 15 minutes with rare minimal  assistance (01/29/15)   Time 8   Period Weeks   Status On-going   SLP LONG TERM GOAL #3   Title Pt will converse audibly in noisy environment for 15 minutes with rare minimal assistance (01/29/15)   Time 8   Period Weeks   Status On-going          Plan - 12/10/14 0931    Clinical Impression Statement Pt with cont very flat affect, requiring HEP for facial muscles provided today. Showed success today with apprpriate voicing with "HEY! but only 30-40% WNL voicing with loud /a/. SLP modified pt's loud practice to "HEY" instead of /a/.   Duration --  7 weeks   Treatment/Interventions Compensatory strategies;Functional tasks;Patient/family education;SLP instruction and feedback;Internal/external aids   Potential to Achieve Goals Fair   Potential Considerations Severity of impairments        Problem List Patient Active Problem List   Diagnosis Date Noted  . Sinus disease 07/28/2014  . MDD (major depressive disorder), recurrent episode, severe 02/10/2014  . Nonspecific abnormal electrocardiogram (ECG) (EKG) 02/07/2014  . Non-compliant behavior 02/03/2014  . Other abnormal glucose 10/02/2013  . Morton's neuroma of left foot 08/07/2013  . Stress fracture of left foot 07/17/2013  . 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 Major depressive disorder, recurrent episode, severe 03/13/2012  . Hyperglycemia 01/26/2012  . Hypogonadism male 01/24/2012  . Syncope and collapse 01/20/2012  .  OSA (obstructive sleep apnea) 01/17/2011  . CHEST TIGHTNESS 02/18/2008    [facility], SLP 12/10/2014, 9:34 AM  [facility] [address] [place], [place], [address] Phone: [phone]   Fax:  [phone]

## 2014-12-10 NOTE — Patient Instructions (Addendum)
  Hold off on practicing loud "ah" for the time being   Perform loud "HEY!!" 3 repetitions, 3 times - twice a day  --hold your hand on your abdomen to get the idea of the abdominal push needed for the loud "ah"s  In front of a mirror, perform 10 BIG smiles (think of something that would make you really happy), then 10 BIG frowns

## 2014-12-11 NOTE — Therapy (Signed)
[facility] [address] [place], [place], [address] Phone: [phone]   Fax:  [phone]  Physical Therapy Treatment  Patient Details  Name: [name] [name] MRN: [id number] Date of Birth: 12/22/1950 Referring Provider:  Olga Millers, MD  Encounter Date: 12/10/2014      PT End of Session - 12/11/14 1154    Visit Number 3   Number of Visits 17   Date for PT Re-Evaluation 01/30/15   Authorization Type BCBS 75 visit limit combined   PT Start Time 0814   PT Stop Time 0845   PT Time Calculation (min) 31 min   Activity Tolerance Patient tolerated treatment well   Behavior During Therapy [facility] for tasks assessed/performed      Past Medical History  Diagnosis Date  . Headache(784.0)   . Cardiac murmur     as a child  . Streptococcal meningitis     as an infant  . Depression   .  OSA (obstructive sleep apnea) 01/17/2011    npsg 2012:  AHI 67/hr. Auto titration 2012:  Optimal pressure 12cm.   Marland Kitchen HEADACHES, HX OF 02/18/2008    Qualifier: Diagnosis of  By: Danny Lawless CMA, [place]    . APPENDECTOMY, HX OF 02/18/2008    Qualifier: Diagnosis of  By: Danny Lawless CMA, [place]    . Parkinson disease 11/2014    Past Surgical History  Procedure Laterality Date  . Nasal sinus surgery      x 4 as a child  . Vasectomy    . Appendectomy  1967    There were no vitals taken for this visit.  Visit Diagnosis:  Bradykinesia  Abnormality of gait      Subjective Assessment - 12/10/14 0816    Symptoms Feel fine today; just running late due to traffic   Currently in Pain? No/denies            Gait activities at end of session with particular cues for large amplitude step length, arm swing and posture.  Used walking poles for facilitation of reciprocal arm swing.           [facility]) - 12/10/14 5400    PWR! exercises Moves in sitting;Moves in standing   PWR! Up --   PWR! Kevin! Twist --   PWR! Step  --   PWR! Up 10   PWR! Rock 10   PWR! Twist 10   PWR Step 10   Comments At counter for intermittent UE support; visual and verbal cues for technique   PWR! Up 20   PWR! Rock 20   PWR! Twist 20   PWR! Step 20   Comments Cues provided for technique and reason/functional connection to each PWR! Moves exercise             PT Education - 12/10/14 1725    Education provided Yes   Education Details PWR! Supine HEP and Sit to stand with large amplitude UE movement, and tips to avoid freezing   Person(s) Educated Patient   Methods Explanation;Demonstration;Handout;Tactile cues;Verbal cues   Comprehension Verbalized understanding;Returned demonstration          PT Short Term Goals - 12/02/14 0820    PT SHORT TERM GOAL #1   Title Pt will be independent with HEP for improved transfers, balance, and gait. (Target 12/30/14)   Time 4   Period Weeks   Status New   PT SHORT TERM GOAL #2   Title Pt will  improve 5x sit<>stand to less than or equal to 11.5 seconds for improved transfer efficiency and safety.   Baseline 16.14 sec   Time 4   Period Weeks   Status New   PT SHORT TERM GOAL #3   Title Pt will improve Functional Gait Assessment score to at least 18/30 for decreased fall risk.   Baseline 14/30   Time 4   Period Weeks   Status New   PT SHORT TERM GOAL #4   Title Pt will improve TUG score to less than or equal to 13.5 seconds for decreased fall risk.   Baseline TUG 19.16 sec   Time 4   Period Weeks   Status New   PT SHORT TERM GOAL #5   Title Pt will verbalize understanding of local Parkinson's-disease related resources.   Time 4   Period Weeks   Status New           PT Long Term Goals - 12/02/14 1610    PT LONG TERM GOAL #1   Title Pt will verbalize understanding of fall prevention techniques within the home environment. (Target 01/30/15)   Time 8   Period Weeks   Status New   PT LONG TERM GOAL #2   Title Pt will improve gait velocity to at least 2.62 ft/sec  for improved gait efficiency and safety.   Time 8   Period Weeks   Status New   PT LONG TERM GOAL #3   Title Pt will improve Functional Gait Assessment to at least 22/30 for decreased fall risk.   Time 8   Period Weeks   Status New   PT LONG TERM GOAL #4   Title Pt will improve TUG cognitive score to less than or equal to 15 seconds for decreased fall risk.   Time 8   Period Weeks   Status New   PT LONG TERM GOAL #5   Title Pt will verbalize plans for continued community fitness upon D/C from PT.   Time 8   Period Weeks   Status New               Plan - 12/11/14 1206    Pt will benefit from skilled therapeutic intervention in order to improve on the following deficits Abnormal gait;Decreased activity tolerance;Decreased balance;Decreased endurance;Decreased mobility;Difficulty walking;Decreased strength;Improper body mechanics;Postural dysfunction   Rehab Potential Good   PT Frequency 2x / week   PT Duration 8 weeks   PT Treatment/Interventions ADLs/Self Care Home Management;Gait training;Stair training;Functional mobility training;Therapeutic activities;Neuromuscular re-education;Balance training;Therapeutic exercise;Patient/family education   PT Next Visit Plan Add standing PWR! Moves to [employer] and Agree with Plan of Care Patient        Problem List Patient Active Problem List   Diagnosis Date Noted  . Sinus disease 07/28/2014  . MDD (major depressive disorder), recurrent episode, severe 02/10/2014  . Nonspecific abnormal electrocardiogram (ECG) (EKG) 02/07/2014  . Non-compliant behavior 02/03/2014  . Other abnormal glucose 10/02/2013  . Morton's neuroma of left foot 08/07/2013  . Stress fracture of left foot 07/17/2013  . 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 Major depressive disorder, recurrent episode, severe 03/13/2012  . Hyperglycemia 01/26/2012  . Hypogonadism male 01/24/2012  . Syncope and collapse  01/20/2012  .  OSA (obstructive sleep apnea) 01/17/2011  . CHEST TIGHTNESS 02/18/2008    Jalea Bronaugh W. 12/11/2014, 12:10 PM  Mady Haagensen, PT 12/11/2014 12:11 PM Phone: [phone] Fax: [place]  [facility] [address] [place], [place], [address] Phone: [phone]   Fax:  [phone]

## 2014-12-16 ENCOUNTER — Ambulatory Visit: Payer: BLUE CROSS/BLUE SHIELD

## 2014-12-16 ENCOUNTER — Ambulatory Visit: Payer: BLUE CROSS/BLUE SHIELD | Admitting: Occupational Therapy

## 2014-12-16 ENCOUNTER — Ambulatory Visit: Payer: BLUE CROSS/BLUE SHIELD | Admitting: Physical Therapy

## 2014-12-16 DIAGNOSIS — R293 Abnormal posture: Secondary | ICD-10-CM

## 2014-12-16 DIAGNOSIS — R279 Unspecified lack of coordination: Secondary | ICD-10-CM

## 2014-12-16 DIAGNOSIS — R29898 Other symptoms and signs involving the musculoskeletal system: Secondary | ICD-10-CM

## 2014-12-16 DIAGNOSIS — R258 Other abnormal involuntary movements: Secondary | ICD-10-CM

## 2014-12-16 DIAGNOSIS — G20A1 Parkinson's disease without dyskinesia, without mention of fluctuations: Secondary | ICD-10-CM

## 2014-12-16 DIAGNOSIS — R490 Dysphonia: Secondary | ICD-10-CM

## 2014-12-16 DIAGNOSIS — G20 Parkinson's disease: Secondary | ICD-10-CM | POA: Diagnosis not present

## 2014-12-16 NOTE — Patient Instructions (Addendum)
PWR! Hands ? ?With arms stretched out in front of you (elbows straight), perform the following: ?PWR! Rock: Move wrists up and down BIG ?PWR! Twist: Twist palms up and down BIG ? ?Then, start with elbows bent and hands closed. ?PWR! Step: Touch index finger to thumb while keeping other fingers straight. Flick fingers out BIG (thumb out/straighten fingers). Repeat with other fingers. (Step your thumb to each finger). ?PWR! Hands: Push hands out BIG. Elbows straight, wrists up, fingers open and spread apart BIG. (Can also perform by pushing down on table, chair, knees. Push above head, out to the side, behind you, in front of you.) ? ? ?** Make each movement big and deliberate so that you feel the movement. ? ?Perform at least 10 repetitions 1x/day, but perform PWR! hands throughout the day when you are having trouble using your hands (picking up/manipulating small objects, writing, eating, typing, sewing, buttoning, etc.). ? ? ? ?Coordination Exercises ? ?Perform the following exercises for 20 minutes 1 times per day. Perform with both hand(s). Perform using big movements. ? ?Flipping Cards: Place deck of cards on the table. Flip cards over by opening your hand big to grasp and then turn your palm up big. ?Deal cards: Hold 1/2 or whole deck in your hand. Use thumb to push card off top of deck with one big push. ?

## 2014-12-16 NOTE — Therapy (Signed)
[facility] [address] [place], [place], [address] Phone: [phone]   Fax:  [phone]  Speech Language Pathology Treatment  Patient Details  Name: [name] [name] MRN: [id number] Date of Birth: [birth year] Referring Provider:  Olga Millers, MD  Encounter Date: 12/16/2014      End of Session - 12/16/14 0934    Visit Number 4   Number of Visits 16   Date for SLP Re-Evaluation 01/29/15   SLP Start Time 0849   SLP Stop Time  0931   SLP Time Calculation (min) 42 min   Activity Tolerance Patient tolerated treatment well      Past Medical History  Diagnosis Date  . Headache(784.0)   . Cardiac murmur     as a child  . Streptococcal meningitis     as an infant  . Depression   .  OSA (obstructive sleep apnea) 01/17/2011    npsg 2012:  AHI 67/hr. Auto titration 2012:  Optimal pressure 12cm.   Marland Kitchen HEADACHES, HX OF 02/18/2008    Qualifier: Diagnosis of  By: Danny Lawless CMA, [place]    . APPENDECTOMY, HX OF 02/18/2008    Qualifier: Diagnosis of  By: Danny Lawless CMA, [place]    . Parkinson disease 11/2014    Past Surgical History  Procedure Laterality Date  . Nasal sinus surgery      x 4 as a child  . Vasectomy    . Appendectomy  1967    There were no vitals filed for this visit.  Visit Diagnosis: Hypokinetic Parkinsonian dysphonia      Subjective Assessment - 12/16/14 0855    Symptoms "It's been annoying." (re: homework)               ADULT SLP TREATMENT - 12/16/14 0856    General Information   Behavior/Cognition --  flat affect/masked facies   Treatment Provided   Treatment provided Cognitive-Linquistic   Pain Assessment   Pain Assessment No/denies pain   Cognitive-Linquistic Treatment   Treatment focused on Dysarthria   Skilled Treatment Last session, SLP also worked with pt with facial exercises. Facial exercises today with mod A including modeling for pt to exaggerate expressions (happy,  sad, frustrated/mad, surprised). SLP facilitated pt's practice with loud /a/ from pt practiced "hey!"     Assessment / Recommendations / [place] with current plan of care   Progression Toward Goals   Progression toward goals Progressing toward goals          SLP Education - 12/16/14 0933    Education provided Yes   Education Details facial expression practice, loud /a/ with "Hey!"   Person(s) Educated Patient   Methods Explanation;Demonstration;Verbal cues;Other (comment)  modeling   Comprehension Verbalized understanding;Returned demonstration;Verbal cues required          SLP Short Term Goals - 12/16/14 0935    SLP SHORT TERM GOAL #1   Title Pt will demonstrate abdominal breathing in structured simple conversation for 10 minutes with occassional minimal cues.12/29/14   Time 3   Period Weeks   Status On-going   SLP SHORT TERM GOAL #2   Title Pt will demonstrate average of 84dB for sustained /a/ over 3 sessions with rare minimal assistance (12/29/14)   Time 3   Period Weeks   Status On-going   SLP SHORT TERM GOAL #3   Title Pt will average 70dB in simple structured speech tasks over 20 minutes with occassional minimal assistance (12/29/14)  Time 3   Period Weeks   Status On-going          SLP Long Term Goals - 12/16/14 0935    SLP LONG TERM GOAL #1   Title Demonstrate abdominal breathing for mildly complex conversation for 15 minutes with occassional minimal cues (01/29/15)   Time 7   Period Weeks   Status On-going   SLP LONG TERM GOAL #2   Title Pt will maintain an average of 70dBfor mildly complex conversation over 15 minutes with rare minimal assistance (01/29/15)   Time 7   Period Weeks   Status On-going   SLP LONG TERM GOAL #3   Title Pt will converse audibly in noisy environment for 15 minutes with rare minimal assistance (01/29/15)   Time 7   Period Weeks   Status On-going          Plan - 12/16/14 0934    Clinical Impression Statement Pt  cont with reduced facial expression but is practicing facial expressions at home. Loud /a/ better today, paired with initial "Hey!" for pt to feel abdominal push.   Speech Therapy Frequency 2x / week   Duration --  6 weeks   Treatment/Interventions Compensatory strategies;Functional tasks;Patient/family education;SLP instruction and feedback;Internal/external aids   Potential to Achieve Goals Fair   Potential Considerations Severity of impairments        Problem List Patient Active Problem List   Diagnosis Date Noted  . Sinus disease 07/28/2014  . MDD (major depressive disorder), recurrent episode, severe 02/10/2014  . Nonspecific abnormal electrocardiogram (ECG) (EKG) 02/07/2014  . Non-compliant behavior 02/03/2014  . Other abnormal glucose 10/02/2013  . Morton's neuroma of left foot 08/07/2013  . Stress fracture of left foot 07/17/2013  . 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 Major depressive disorder, recurrent episode, severe 03/13/2012  . Hyperglycemia 01/26/2012  . Hypogonadism male 01/24/2012  . Syncope and collapse 01/20/2012  .  OSA (obstructive sleep apnea) 01/17/2011  . CHEST TIGHTNESS 02/18/2008    Alois Mincer,SLP 12/16/2014, 9:36 AM  [facility] [address] [place], [place], [address] Phone: [phone]   Fax:  [phone]

## 2014-12-16 NOTE — Patient Instructions (Signed)
HIP: Hamstrings - Short Sitting   Rest leg on raised surface or you can prop your foot on the floor. Keep knee straight. Sit tall and keep your chest tall. Hold _30__ seconds. _3__ reps per set, _2-3__ sets per day Copyright  VHI. All rights reserved.  Knee to Chest (Flexion)   Pull knee toward chest. Feel stretch in lower back or buttock area. Breathing deeply, Hold _30___ seconds. Repeat with other knee. Repeat _3___ times. Do _2-3___ sessions per day.  http://gt2.exer.us/225   Copyright  VHI. All rights reserved.

## 2014-12-16 NOTE — Therapy (Signed)
[facility] [address] [place], [place], [address] Phone: [phone]   Fax:  [phone]  Occupational Therapy Treatment  Patient Details  Name: [name] [name] MRN: [id number] Date of Birth: [birth year] Referring Provider:  Olga Millers, MD  Encounter Date: 12/16/2014      OT End of Session - 12/16/14 1005    Visit Number 3   Number of Visits 17   Date for OT Re-Evaluation 01/29/15   Authorization Type BCBS 75 visit limit combined, no auth   OT Start Time [phone]   OT Stop Time 1015   OT Time Calculation (min) 39 min   Activity Tolerance Patient tolerated treatment well   Behavior During Therapy [facility] for tasks assessed/performed      Past Medical History  Diagnosis Date  . Headache(784.0)   . Cardiac murmur     as a child  . Streptococcal meningitis     as an infant  . Depression   .  OSA (obstructive sleep apnea) 01/17/2011    npsg 2012:  AHI 67/hr. Auto titration 2012:  Optimal pressure 12cm.   Marland Kitchen HEADACHES, HX OF 02/18/2008    Qualifier: Diagnosis of  By: Danny Lawless CMA, [place]    . APPENDECTOMY, HX OF 02/18/2008    Qualifier: Diagnosis of  By: Danny Lawless CMA, [place]    . Parkinson disease 11/2014    Past Surgical History  Procedure Laterality Date  . Nasal sinus surgery      x 4 as a child  . Vasectomy    . Appendectomy  1967    There were no vitals filed for this visit.  Visit Diagnosis:  Bradykinesia  Rigidity  Lack of coordination      Subjective Assessment - 12/16/14 0945    Symptoms Pt reports that he feels a little better.   Currently in Pain? No/denies                    OT Treatments/Exercises (OP) - 12/16/14 0001    ADLs   Driving Pt reports that he makes wide turn to the R when driving.  Discussed that this may be due to bradykinesia with LUE not turning wheel as big as needed to make tighter turn.  Discussed using big movements for this and recommended [employer]  Over Parkinson's Communit y[employer] Meeting today as topic is driving today.  Also recommended POP in the future as a resource.                OT Education - 12/16/14 1012    Education Details PWR! hands HEP, initiated coordination HEP, importance of big movements in PD (research, how PD symptoms affect UE function, use to prevent future complications)   Person(s) Educated Patient   Methods Explanation;Demonstration;Tactile cues;Verbal cues;Handout   Comprehension Verbalized understanding;Returned demonstration;Verbal cues required;Need further instruction          OT Short Term Goals - 12/01/14 0859    OT SHORT TERM GOAL #1   Title Pt will be independent with HEP--due 12/30/14   Time 4   Period Weeks   Status New   OT SHORT TERM GOAL #2   Title Pt will verbalize understanding of PD symptoms and ways to prevent future complications.--due 12/30/14   Time 4   Period Weeks   Status New   OT SHORT TERM GOAL #3   Title Pt will verbalize understanding of PD-specific appropriate community resources.--due 12/30/14   Time 4   Period Weeks  Status New   OT SHORT TERM GOAL #4   Title Pt will complete dressing fasteners mod I using strategies/AE prn.--due 12/30/14   Time 4   Period Weeks   Status New   OT SHORT TERM GOAL #5   Title Pt will improve coordination/functional reaching as shown by improving score on box and blocks test by at least 10 with RUE.--due 12/30/14   Baseline 37   Time 4   Period Weeks   Status New           OT Long Term Goals - 12/01/14 8270    OT LONG TERM GOAL #1   Title Pt will verbalize understanding of AE/strategies for ADLs/IADLs prn.--due 01/29/15   Time 8   Period Weeks   Status New   OT LONG TERM GOAL #2   Title Pt will improve coordination for ADLs as shown by improving time on 9-hole peg test by at least 5 bilaterally.--due 01/29/15   Baseline R-32.59sec, L-35.66sec   Time 8   Period Weeks   Status New   OT LONG TERM GOAL #3   Title Pt  will improve coordination/functional reaching as shown by improving score on box and blocks test by at least 5 with LUE.--due 01/29/15   Baseline 45   Time 8   Period Weeks   Status New   OT LONG TERM GOAL #4   Title Pt will improve coordination/functional reaching as shown by improving score on box and blocks test by at least 10 with RUE.--due 01/29/15   Baseline 37   Time 8   Period Weeks   Status New   OT LONG TERM GOAL #5   Title Pt will improve dressing ability as shown by improving time on PPT#4 by at least 5sec.--due 01/29/15   Baseline 24.53sec   Time 8   Period Weeks   Status New   Long Term Additional Goals   Additional Long Term Goals Yes   OT LONG TERM GOAL #6   Title Pt will improve bradykinesia as shown by improving time on PPT#2 by at least 5sec.--due 01/29/15   Baseline 15.35   Time 8   Period Weeks   Status New               Plan - 12/16/14 1006    Clinical Impression Statement Pt responds well to cues for big movments after initial mod cueing.   Plan review and add to coordination HEP, reinforce big movements        Problem List Patient Active Problem List   Diagnosis Date Noted  . Sinus disease 07/28/2014  . MDD (major depressive disorder), recurrent episode, severe 02/10/2014  . Nonspecific abnormal electrocardiogram (ECG) (EKG) 02/07/2014  . Non-compliant behavior 02/03/2014  . Other abnormal glucose 10/02/2013  . Morton's neuroma of left foot 08/07/2013  . Stress fracture of left foot 07/17/2013  . 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 Major depressive disorder, recurrent episode, severe 03/13/2012  . Hyperglycemia 01/26/2012  . Hypogonadism male 01/24/2012  . Syncope and collapse 01/20/2012  .  OSA (obstructive sleep apnea) 01/17/2011  . CHEST TIGHTNESS 02/18/2008    [facility] 12/16/2014, 1:29 PM  [facility] [address] [place], [place], [address] Phone: [phone]   Fax:  [place], OTR/L 12/16/2014 1:29 PM

## 2014-12-17 NOTE — Therapy (Signed)
[facility] [address] [place], [place], [address] Phone: [phone]   Fax:  [phone]  Physical Therapy Treatment  Patient Details  Name: [name] [name] MRN: [id number] Date of Birth: 1951/02/17 Referring Provider:  Olga Millers, MD  Encounter Date: 12/16/2014      PT End of Session - 12/17/14 0811    Visit Number 4   Number of Visits 17   Date for PT Re-Evaluation 01/30/15   Authorization Type BCBS 75 visit limit combined   PT Start Time 1022   PT Stop Time 1101   PT Time Calculation (min) 39 min   Activity Tolerance Patient tolerated treatment well      Past Medical History  Diagnosis Date  . Headache(784.0)   . Cardiac murmur     as a child  . Streptococcal meningitis     as an infant  . Depression   .  OSA (obstructive sleep apnea) 01/17/2011    npsg 2012:  AHI 67/hr. Auto titration 2012:  Optimal pressure 12cm.   Marland Kitchen HEADACHES, HX OF 02/18/2008    Qualifier: Diagnosis of  By: Danny Lawless CMA, [place]    . APPENDECTOMY, HX OF 02/18/2008    Qualifier: Diagnosis of  By: Danny Lawless CMA, [place]    . Parkinson disease 11/2014    Past Surgical History  Procedure Laterality Date  . Nasal sinus surgery      x 4 as a child  . Vasectomy    . Appendectomy  1967    There were no vitals filed for this visit.  Visit Diagnosis:  Bradykinesia  Rigidity  Abnormal posture      Subjective Assessment - 12/16/14 1023    Symptoms No changes, no falls since last visit   Currently in Pain? No/denies                 The following stretches were performed after standing PWR! Moves exercises as a result of pt complaining of slight increase in low back pain with step and weightshift in posterior direction.  Pt has no c/o pain after stretching exercises are completed.      [facility] Adult PT Treatment/Exercise - 12/16/14 1053    Exercises   Exercises Lumbar   Lumbar Exercises: Stretches   Active  Hamstring Stretch 3 reps;30 seconds   Active Hamstring Stretch Limitations seated position   Single Knee to Chest Stretch 3 reps;20 seconds           [facility]) - 12/16/14 1041    PWR! exercises Moves in standing   PWR! Up 10   PWR! Rock 20   PWR! Twist 20   PWR Step 20   Comments At counter-with UE support; added to HEP    PWR! Moves performed to address posture, weightshifting, trunk rotation, and initial large amplitude stepping, with verbal, visual and tactile cues provided for large amplitude movement patterns.  At counter, pt also performs forward step and weightshift x 10 reps each side, then back step and weightshift x 10 reps each side with UE support at counter.         PT Education - 12/17/14 0810    Education provided Yes   Education Details Added PWR! Moves Basic 4 in standing, seated hamstring stretch and supine single knee to chest stretch   Person(s) Educated Patient   Methods Explanation;Demonstration;Tactile cues;Verbal cues;Handout   Comprehension Verbalized understanding;Verbal cues required;Need further instruction          PT  Short Term Goals - 12/02/14 0820    PT SHORT TERM GOAL #1   Title Pt will be independent with HEP for improved transfers, balance, and gait. (Target 12/30/14)   Time 4   Period Weeks   Status New   PT SHORT TERM GOAL #2   Title Pt will improve 5x sit<>stand to less than or equal to 11.5 seconds for improved transfer efficiency and safety.   Baseline 16.14 sec   Time 4   Period Weeks   Status New   PT SHORT TERM GOAL #3   Title Pt will improve Functional Gait Assessment score to at least 18/30 for decreased fall risk.   Baseline 14/30   Time 4   Period Weeks   Status New   PT SHORT TERM GOAL #4   Title Pt will improve TUG score to less than or equal to 13.5 seconds for decreased fall risk.   Baseline TUG 19.16 sec   Time 4   Period Weeks   Status New   PT SHORT TERM GOAL #5   Title Pt will verbalize understanding of  local Parkinson's-disease related resources.   Time 4   Period Weeks   Status New           PT Long Term Goals - 12/02/14 9147    PT LONG TERM GOAL #1   Title Pt will verbalize understanding of fall prevention techniques within the home environment. (Target 01/30/15)   Time 8   Period Weeks   Status New   PT LONG TERM GOAL #2   Title Pt will improve gait velocity to at least 2.62 ft/sec for improved gait efficiency and safety.   Time 8   Period Weeks   Status New   PT LONG TERM GOAL #3   Title Pt will improve Functional Gait Assessment to at least 22/30 for decreased fall risk.   Time 8   Period Weeks   Status New   PT LONG TERM GOAL #4   Title Pt will improve TUG cognitive score to less than or equal to 15 seconds for decreased fall risk.   Time 8   Period Weeks   Status New   PT LONG TERM GOAL #5   Title Pt will verbalize plans for continued community fitness upon D/C from PT.   Time 8   Period Weeks   Status New               Plan - 12/17/14 8295    Clinical Impression Statement Pt requires verbal and visual cues for technique of large amplitude movements.  Pt does report increased awareness of deliberate arm swing and step length with gait.  Pt will continue to benefit from further skilled PT to address functional mobility, gait, transfers, posture and balance.   Pt will benefit from skilled therapeutic intervention in order to improve on the following deficits Abnormal gait;Decreased activity tolerance;Decreased balance;Decreased endurance;Decreased mobility;Difficulty walking;Decreased strength;Improper body mechanics;Postural dysfunction   Rehab Potential Good   PT Frequency 2x / week   PT Duration 8 weeks  wk 2 of 8   PT Treatment/Interventions ADLs/Self Care Home Management;Gait training;Stair training;Functional mobility training;Therapeutic activities;Neuromuscular re-education;Balance training;Therapeutic exercise;Patient/family education   PT Next  Visit Plan Review stretches and PWR! Moves in standing   Consulted and Agree with Plan of Care Patient        Problem List Patient Active Problem List   Diagnosis Date Noted  . Sinus disease 07/28/2014  . MDD (major depressive  disorder), recurrent episode, severe 02/10/2014  . Nonspecific abnormal electrocardiogram (ECG) (EKG) 02/07/2014  . Non-compliant behavior 02/03/2014  . Other abnormal glucose 10/02/2013  . Morton's neuroma of left foot 08/07/2013  . Stress fracture of left foot 07/17/2013  . 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 Major depressive disorder, recurrent episode, severe 03/13/2012  . Hyperglycemia 01/26/2012  . Hypogonadism male 01/24/2012  . Syncope and collapse 01/20/2012  .  OSA (obstructive sleep apnea) 01/17/2011  . CHEST TIGHTNESS 02/18/2008    Daronte Shostak W. 12/17/2014, 8:16 AM  Mady Haagensen, PT 12/17/2014 8:19 AM Phone: [phone] Fax: [place] [facility] [address] [place], [place], [address] Phone: [phone]   Fax:  [phone]

## 2014-12-19 ENCOUNTER — Ambulatory Visit: Payer: BLUE CROSS/BLUE SHIELD

## 2014-12-19 ENCOUNTER — Ambulatory Visit: Payer: BLUE CROSS/BLUE SHIELD | Admitting: Occupational Therapy

## 2014-12-19 ENCOUNTER — Ambulatory Visit: Payer: BLUE CROSS/BLUE SHIELD | Admitting: Physical Therapy

## 2014-12-19 DIAGNOSIS — G20A1 Parkinson's disease without dyskinesia, without mention of fluctuations: Secondary | ICD-10-CM

## 2014-12-19 DIAGNOSIS — G20 Parkinson's disease: Secondary | ICD-10-CM | POA: Diagnosis not present

## 2014-12-19 DIAGNOSIS — R258 Other abnormal involuntary movements: Secondary | ICD-10-CM

## 2014-12-19 DIAGNOSIS — R29898 Other symptoms and signs involving the musculoskeletal system: Secondary | ICD-10-CM

## 2014-12-19 DIAGNOSIS — R490 Dysphonia: Secondary | ICD-10-CM

## 2014-12-19 DIAGNOSIS — R279 Unspecified lack of coordination: Secondary | ICD-10-CM

## 2014-12-19 NOTE — Patient Instructions (Signed)
  Coordination Activities  Perform the following activities for 20 minutes 1-2 times per day with both hand(s).   Rotate ball in fingertips (clockwise and counter-clockwise).  Toss ball between hands.  Toss ball in air and catch with the same hand.  Flip cards 1 at a time as fast as you can.  Deal cards with your thumb (Hold deck in hand and push card off top with thumb).  Pick up coins, buttons, marbles, dried beans/pasta of different sizes and place in container.  Pick up coins and place in container or coin bank.  Pick up coins and stack.  Pick up coins one at a time until you get 5-10 in your hand, then move coins from palm to fingertips to stack one at a time.  Practice writing and/or typing.   Rotate nuts and bolts

## 2014-12-19 NOTE — Therapy (Signed)
[facility] [address] [place], [place], [address] Phone: [phone]   Fax:  [phone]  Speech Language Pathology Treatment  Patient Details  Name: [name] [name] MRN: [id number] Date of Birth: Apr 13, 1951 Referring Provider:  Olga Millers, MD  Encounter Date: 12/19/2014      End of Session - 12/19/14 0931    Visit Number 5   Number of Visits 16   Date for SLP Re-Evaluation 01/29/15   SLP Start Time 0849   SLP Stop Time  0931   SLP Time Calculation (min) 42 min   Activity Tolerance Patient tolerated treatment well      Past Medical History  Diagnosis Date  . Headache(784.0)   . Cardiac murmur     as a child  . Streptococcal meningitis     as an infant  . Depression   .  OSA (obstructive sleep apnea) 01/17/2011    npsg 2012:  AHI 67/hr. Auto titration 2012:  Optimal pressure 12cm.   Marland Kitchen HEADACHES, HX OF 02/18/2008    Qualifier: Diagnosis of  By: Danny Lawless CMA, [place]    . APPENDECTOMY, HX OF 02/18/2008    Qualifier: Diagnosis of  By: Danny Lawless CMA, [place]    . Parkinson disease 11/2014    Past Surgical History  Procedure Laterality Date  . Nasal sinus surgery      x 4 as a child  . Vasectomy    . Appendectomy  1967    There were no vitals filed for this visit.  Visit Diagnosis: Hypokinetic Parkinsonian dysphonia      Subjective Assessment - 12/19/14 0908    Symptoms Pt has completed homework (loud /a/ and facial exercises) once per day. SLP reminded pt of why x2/day prescribed.               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 Sentence responses with average 71dB (definitions). Three ascending or descending items given one item in a category average 70dB.    Assessment / Recommendations / Plan   Plan Continue with current plan of care   Progression Toward Goals   Progression toward goals Progressing toward goals          SLP Education - 12/19/14 0931    Education provided Yes   Education Details reviewed why pt is completing loud /a/ and facial exercises   Person(s) Educated Patient   Methods Explanation   Comprehension Verbalized understanding;Need further instruction          SLP Short Term Goals - 12/16/14 0935    SLP SHORT TERM GOAL #1   Title Pt will demonstrate abdominal breathing in structured simple conversation for 10 minutes with occassional minimal cues.12/29/14   Time 3   Period Weeks   Status On-going   SLP SHORT TERM GOAL #2   Title Pt will demonstrate average of 84dB for sustained /a/ over 3 sessions with rare minimal assistance (12/29/14)   Time 3   Period Weeks   Status On-going   SLP SHORT TERM GOAL #3   Title Pt will average 70dB in simple structured speech tasks over 20 minutes  with occassional minimal assistance (12/29/14)   Time 3   Period Weeks   Status On-going          SLP Long Term Goals - 12/16/14 0935    SLP LONG TERM GOAL #1   Title Demonstrate abdominal breathing for mildly complex conversation for 15 minutes with occassional minimal cues (01/29/15)   Time 7   Period Weeks   Status On-going   SLP LONG TERM GOAL #2   Title Pt will maintain an average of 70dBfor mildly complex conversation over 15 minutes with rare minimal assistance (01/29/15)   Time 7   Period Weeks   Status On-going   SLP LONG TERM GOAL #3   Title Pt will converse audibly in noisy environment for 15 minutes with rare minimal assistance (01/29/15)   Time 7   Period Weeks   Status On-going          Plan - 12/19/14 0932    Clinical Impression Statement Pt cont with  reduced facial expression but is practicing facial expressions at home. Loud /a/ better today, paired with initial "Hey!" for pt to feel abdominal push.   Speech Therapy Frequency 2x / week   Duration --  6 weeks   Treatment/Interventions Compensatory strategies;Functional tasks;Patient/family education;SLP instruction and feedback;Internal/external aids   Potential to Achieve Goals Fair   Potential Considerations Severity of impairments        Problem List Patient Active Problem List   Diagnosis Date Noted  . Sinus disease 07/28/2014  . MDD (major depressive disorder), recurrent episode, severe 02/10/2014  . Nonspecific abnormal electrocardiogram (ECG) (EKG) 02/07/2014  . Non-compliant behavior 02/03/2014  . Other abnormal glucose 10/02/2013  . Morton's neuroma of left foot 08/07/2013  . Stress fracture of left foot 07/17/2013  . 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 Major depressive disorder, recurrent episode, severe 03/13/2012  . Hyperglycemia 01/26/2012  . Hypogonadism male 01/24/2012  . Syncope and collapse 01/20/2012  .  OSA (obstructive sleep apnea) 01/17/2011  . CHEST TIGHTNESS 02/18/2008    [facility], SLP 12/19/2014, 9:33 AM  [facility] [address] [place], [place], [address] Phone: [phone]   Fax:  [phone]

## 2014-12-19 NOTE — Patient Instructions (Signed)
  Please complete the assigned speech therapy homework and before your next session.

## 2014-12-19 NOTE — Patient Instructions (Signed)
Tips to reduce freezing episodes with standing or walking:  1. Stand tall with your feet wide, so that you can rock and weight shift through your hips. 2. Don't try to fight the freeze: if you begin taking slower, faster, smaller steps, STOP, get your posture tall, and RESET your posture and balance.  Take a deep breath before taking the BIG step to start again. 3. March in place, with high knee stepping, to get started walking again. 4. Use auditory cues:  Count out loud, think of a familiar tune or song or cadence, use pocket metronome, to use rhythm to get started walking again. 5. Use visual cues:  Use a line to step over, use laser pointer line to step over, (using BIG steps) to start walking again. 6. Use visual targets to keep your posture tall (look ahead and focus on an object or target at eye level). 7. As you approach where your destination with walking, count your steps out loud and/or focus on your target with your eyes until you are fully there. 8. Use appropriate assistive device, as advised by your physical therapist to assist with taking longer, consistent steps. 9.     

## 2014-12-19 NOTE — Therapy (Signed)
[facility] [address] [place], [place], [address] Phone: [phone]   Fax:  [phone]  Occupational Therapy Treatment  Patient Details  Name: [name] [name] MRN: [id number] Date of Birth: 12/22/1950 Referring Provider:  Olga Millers, MD  Encounter Date: 12/19/2014      OT End of Session - 12/19/14 0820    Visit Number 4   Number of Visits 17   Date for OT Re-Evaluation 01/29/15   Authorization Type [employer] 75 visit limit combined, no auth   OT Start Time 0805   OT Stop Time 0845   OT Time Calculation (min) 40 min   Activity Tolerance Patient tolerated treatment well   Behavior During Therapy [facility] for tasks assessed/performed      Past Medical History  Diagnosis Date  . Headache(784.0)   . Cardiac murmur     as a child  . Streptococcal meningitis     as an infant  . Depression   .  OSA (obstructive sleep apnea) 01/17/2011    npsg 2012:  AHI 67/hr. Auto titration 2012:  Optimal pressure 12cm.   Marland Kitchen HEADACHES, HX OF 02/18/2008    Qualifier: Diagnosis of  By: Danny Lawless CMA, [place]    . APPENDECTOMY, HX OF 02/18/2008    Qualifier: Diagnosis of  By: Danny Lawless CMA, [place]    . Parkinson disease 11/2014    Past Surgical History  Procedure Laterality Date  . Nasal sinus surgery      x 4 as a child  . Vasectomy    . Appendectomy  1967    There were no vitals filed for this visit.  Visit Diagnosis:  Lack of coordination  Bradykinesia  Rigidity      Subjective Assessment - 12/19/14 0809    Pertinent History Diagnosed with Parkinson's approx 1 month ago.   Currently in Pain? No/denies                    OT Treatments/Exercises (OP) - 12/19/14 0001    Fine Motor Coordination   Fine Motor Coordination Flipping cards;Dealing card with thumb;Tossing ball   Small Pegboard copying small peg design for cognitive component, min v.c.   Flipping cards min v.c. for big movement, bilaterally    Dealing card with thumb min v.c for big movement bilaterally   Picking up coins min v.c., bilaterallly   Manipulating coins min v.c. bilaterally   Stacking coins min v.c. bilaterally   Tossing ball between hands, min v.c./ min difficulty, tossing in each hand, then rotating min v.c.   Nuts and Bolts min v.c., bilaterally   Neurological Re-education Exercises   Reciprocal Movements Arm bike x 6 mins, for conditioning, min v.c. for speed, pt able to maintain 40 RPM           [facility]) - 12/19/14 0819    PWR! Up 10   PWR! Rock 10   PWR! Twist 10   PWR! Step 10   Comments min v.c./ demonstration for each exercise             OT Education - 12/19/14 0840    Education provided Yes   Education Details reviewed PWR! hands, and added to coordiantion HEP.   Person(s) Educated Patient   Methods Explanation;Demonstration;Verbal cues;Handout   Comprehension Verbalized understanding;Returned demonstration;Verbal cues required          OT Short Term Goals - 12/01/14 0859    OT SHORT TERM GOAL #1   Title Pt  will be independent with HEP--due 12/30/14   Time 4   Period Weeks   Status New   OT SHORT TERM GOAL #2   Title Pt will verbalize understanding of PD symptoms and ways to prevent future complications.--due 12/30/14   Time 4   Period Weeks   Status New   OT SHORT TERM GOAL #3   Title Pt will verbalize understanding of PD-specific appropriate community resources.--due 12/30/14   Time 4   Period Weeks   Status New   OT SHORT TERM GOAL #4   Title Pt will complete dressing fasteners mod I using strategies/AE prn.--due 12/30/14   Time 4   Period Weeks   Status New   OT SHORT TERM GOAL #5   Title Pt will improve coordination/functional reaching as shown by improving score on box and blocks test by at least 10 with RUE.--due 12/30/14   Baseline 37   Time 4   Period Weeks   Status New           OT Long Term Goals - 12/01/14 1610    OT LONG TERM GOAL #1   Title Pt  will verbalize understanding of AE/strategies for ADLs/IADLs prn.--due 01/29/15   Time 8   Period Weeks   Status New   OT LONG TERM GOAL #2   Title Pt will improve coordination for ADLs as shown by improving time on 9-hole peg test by at least 5 bilaterally.--due 01/29/15   Baseline R-32.59sec, L-35.66sec   Time 8   Period Weeks   Status New   OT LONG TERM GOAL #3   Title Pt will improve coordination/functional reaching as shown by improving score on box and blocks test by at least 5 with LUE.--due 01/29/15   Baseline 45   Time 8   Period Weeks   Status New   OT LONG TERM GOAL #4   Title Pt will improve coordination/functional reaching as shown by improving score on box and blocks test by at least 10 with RUE.--due 01/29/15   Baseline 37   Time 8   Period Weeks   Status New   OT LONG TERM GOAL #5   Title Pt will improve dressing ability as shown by improving time on PPT#4 by at least 5sec.--due 01/29/15   Baseline 24.53sec   Time 8   Period Weeks   Status New   Long Term Additional Goals   Additional Long Term Goals Yes   OT LONG TERM GOAL #6   Title Pt will improve bradykinesia as shown by improving time on PPT#2 by at least 5sec.--due 01/29/15   Baseline 15.35   Time 8   Period Weeks   Status New               Plan - 12/19/14 9604    Clinical Impression Statement Pt is progressing towards goals for fine motor coordination.   Plan big movements for ADLS, coordination   OT Home Exercise Plan PWR! moves seated issued last week, reviewed coordination/ PWR! hands HEP and added to HEP-12/19/14   Consulted and Agree with Plan of Care Patient        Problem List Patient Active Problem List   Diagnosis Date Noted  . Sinus disease 07/28/2014  . MDD (major depressive disorder), recurrent episode, severe 02/10/2014  . Nonspecific abnormal electrocardiogram (ECG) (EKG) 02/07/2014  . Non-compliant behavior 02/03/2014  . Other abnormal glucose 10/02/2013  . Morton's  neuroma of left foot 08/07/2013  . Stress fracture of left foot  07/17/2013  . 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 Major depressive disorder, recurrent episode, severe 03/13/2012  . Hyperglycemia 01/26/2012  . Hypogonadism male 01/24/2012  . Syncope and collapse 01/20/2012  .  OSA (obstructive sleep apnea) 01/17/2011  . CHEST TIGHTNESS 02/18/2008    [facility] 12/19/2014, 8:47 AM Theone Murdoch, OTR/L [phone] Phone: [phone] 8:47 AM 12/19/2014   [facility] [address] [place], [place], [address] Phone: [phone]   Fax:  [phone]

## 2014-12-22 ENCOUNTER — Telehealth: Payer: Self-pay | Admitting: Neurology

## 2014-12-22 ENCOUNTER — Ambulatory Visit: Payer: BLUE CROSS/BLUE SHIELD | Admitting: Physical Therapy

## 2014-12-22 ENCOUNTER — Ambulatory Visit: Payer: BLUE CROSS/BLUE SHIELD | Admitting: Occupational Therapy

## 2014-12-22 ENCOUNTER — Encounter: Payer: Self-pay | Admitting: Occupational Therapy

## 2014-12-22 ENCOUNTER — Ambulatory Visit: Payer: BLUE CROSS/BLUE SHIELD

## 2014-12-22 DIAGNOSIS — R269 Unspecified abnormalities of gait and mobility: Secondary | ICD-10-CM

## 2014-12-22 DIAGNOSIS — R279 Unspecified lack of coordination: Secondary | ICD-10-CM

## 2014-12-22 DIAGNOSIS — Z7409 Other reduced mobility: Secondary | ICD-10-CM

## 2014-12-22 DIAGNOSIS — R258 Other abnormal involuntary movements: Secondary | ICD-10-CM

## 2014-12-22 DIAGNOSIS — R29898 Other symptoms and signs involving the musculoskeletal system: Secondary | ICD-10-CM

## 2014-12-22 DIAGNOSIS — G20 Parkinson's disease: Secondary | ICD-10-CM | POA: Diagnosis not present

## 2014-12-22 DIAGNOSIS — G20A1 Parkinson's disease without dyskinesia, without mention of fluctuations: Secondary | ICD-10-CM

## 2014-12-22 DIAGNOSIS — R293 Abnormal posture: Secondary | ICD-10-CM

## 2014-12-22 DIAGNOSIS — R490 Dysphonia: Secondary | ICD-10-CM

## 2014-12-22 NOTE — Therapy (Signed)
[facility] [address] [place], [place], [address] Phone: [phone]   Fax:  [phone]  Speech Language Pathology Treatment  Patient Details  Name: [name] [name] MRN: [id number] Date of Birth: [birth year] Referring Provider:  Olga Millers, MD  Encounter Date: 12/22/2014      End of Session - 12/22/14 0929    Visit Number 6   Number of Visits 16   Date for SLP Re-Evaluation 01/29/15   SLP Start Time 0849   SLP Stop Time  0930   SLP Time Calculation (min) 41 min   Activity Tolerance Patient tolerated treatment well      Past Medical History  Diagnosis Date  . Headache(784.0)   . Cardiac murmur     as a child  . Streptococcal meningitis     as an infant  . Depression   .  OSA (obstructive sleep apnea) 01/17/2011    npsg 2012:  AHI 67/hr. Auto titration 2012:  Optimal pressure 12cm.   Marland Kitchen HEADACHES, HX OF 02/18/2008    Qualifier: Diagnosis of  By: Danny Lawless CMA, [place]    . APPENDECTOMY, HX OF 02/18/2008    Qualifier: Diagnosis of  By: Danny Lawless CMA, [place]    . Parkinson disease 11/2014    Past Surgical History  Procedure Laterality Date  . Nasal sinus surgery      x 4 as a child  . Vasectomy    . Appendectomy  1967    There were no vitals filed for this visit.  Visit Diagnosis: Hypokinetic Parkinsonian dysphonia      Subjective Assessment - 12/22/14 0904    Symptoms Pt completed exercises once/day over the weekend. 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 loud /a/ to and from  work and facial exercises during meal prior to going to work. (approx 7-8 minute discussion). loud /a/ copmleted with "hey!" at beginning in order for pt to recall abdominal push - average 83dB, with rough vocal quality x1, then WNL for last 4 reps.Cards describing 3-step sequences average 72dB. Telling SLP monetary amounts with intial cue for loudness. Sentence responses with mod complex information average 72dB with rare min A. SLP cued pt for louder speech BETWEEN reps of sentence tasks, by the end of the task pt self corrected with an initial quieter response. Question and answer with pt average loudness 71dB with rare min A.     Assessment / Recommendations / Plan   Plan Continue with current plan of care   Progression Toward Goals   Progression toward goals Progressing toward goals          SLP Education - 12/22/14 0929    Education provided Yes   Education Details When to perform HEP a second time per day   Person(s) Educated Patient   Methods Explanation   Comprehension Verbalized understanding          SLP Short Term Goals - 12/22/14 0931    SLP SHORT TERM GOAL #1   Title Pt will demonstrate abdominal breathing in structured simple  conversation for 10 minutes with occassional minimal cues.12/29/14   Time 2   Period Weeks   Status On-going   SLP SHORT TERM GOAL #2   Title Pt will demonstrate average of 84dB for sustained /a/ over 3 sessions with rare minimal assistance (12/29/14)   Time 2   Period Weeks   Status On-going   SLP SHORT TERM GOAL #3   Title Pt will average 70dB in simple structured speech tasks over 20 minutes with occassional minimal assistance (12/29/14)   Status Achieved          SLP Long Term Goals - 12/22/14 0931    SLP LONG TERM GOAL #1   Title Demonstrate abdominal breathing for mildly complex conversation for 15 minutes with occassional minimal cues (01/29/15)   Time 6   Period Weeks   Status On-going   SLP LONG TERM GOAL #2   Title Pt will  maintain an average of 70dBfor mildly complex conversation over 15 minutes with rare minimal assistance (01/29/15)   Time 6   Period Weeks   Status On-going   SLP LONG TERM GOAL #3   Title Pt will converse audibly in noisy environment for 15 minutes with rare minimal assistance (01/29/15)   Time 6   Period Weeks   Status On-going          Plan - 12/22/14 0930    Clinical Impression Statement . Loud /a/  cont paired with initial "Hey!" for pt to feel abdominal push, pt beginning to transfer louder speech to between tasks/repetitions of same task.   Speech Therapy Frequency 2x / week   Duration --  5 weeks   Treatment/Interventions Compensatory strategies;Functional tasks;Patient/family education;SLP instruction and feedback;Internal/external aids   Potential to Achieve Goals Fair   Potential Considerations Severity of impairments        Problem List Patient Active Problem List   Diagnosis Date Noted  . Sinus disease 07/28/2014  . MDD (major depressive disorder), recurrent episode, severe 02/10/2014  . Nonspecific abnormal electrocardiogram (ECG) (EKG) 02/07/2014  . Non-compliant behavior 02/03/2014  . Other abnormal glucose 10/02/2013  . Morton's neuroma of left foot 08/07/2013  . Stress fracture of left foot 07/17/2013  . 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 Major depressive disorder, recurrent episode, severe 03/13/2012  . Hyperglycemia 01/26/2012  . Hypogonadism male 01/24/2012  . Syncope and collapse 01/20/2012  .  OSA (obstructive sleep apnea) 01/17/2011  . CHEST TIGHTNESS 02/18/2008    [facility], SLP 12/22/2014, 9:33 AM  [facility] [address] [place], [place], [address] Phone: [phone]   Fax:  [phone]

## 2014-12-22 NOTE — Telephone Encounter (Signed)
Patient's wife made aware that he is assessed by rehab to determine how long he should undergo therapy.

## 2014-12-22 NOTE — Telephone Encounter (Signed)
Pt wife Joycelyn Schmid called and wants to talk to someone about Rehab. She states that he was to have rehab for 63month and now he is sch for April to and wants to check with [place] to see if he is suppose to have another month of rehab please call [phone]

## 2014-12-22 NOTE — Therapy (Signed)
[facility] [address] [place], [place], [address] Phone: [phone]   Fax:  [phone]  Physical Therapy Treatment  Patient Details  Name: [name] [name] MRN: [id number] Date of Birth: [birth year] Referring Provider:  Olga Millers, MD  Encounter Date: 12/19/2014      PT End of Session - 12/22/14 0735    Visit Number 5   Number of Visits 17   Date for PT Re-Evaluation 01/30/15   Authorization Type BCBS 75 visit limit combined   PT Start Time 0935   PT Stop Time 1015   PT Time Calculation (min) 40 min   Equipment Utilized During Treatment Gait belt   Activity Tolerance Patient tolerated treatment well      Past Medical History  Diagnosis Date  . Headache(784.0)   . Cardiac murmur     as a child  . Streptococcal meningitis     as an infant  . Depression   .  OSA (obstructive sleep apnea) 01/17/2011    npsg 2012:  AHI 67/hr. Auto titration 2012:  Optimal pressure 12cm.   Marland Kitchen HEADACHES, HX OF 02/18/2008    Qualifier: Diagnosis of  By: Danny Lawless CMA, [place]    . APPENDECTOMY, HX OF 02/18/2008    Qualifier: Diagnosis of  By: Danny Lawless CMA, [place]    . Parkinson disease 11/2014    Past Surgical History  Procedure Laterality Date  . Nasal sinus surgery      x 4 as a child  . Vasectomy    . Appendectomy  1967    There were no vitals filed for this visit.  Visit Diagnosis:  Bradykinesia  Rigidity                     [facility] Adult PT Treatment/Exercise - 12/22/14 0001    Lumbar Exercises: Stretches   Active Hamstring Stretch 3 reps;30 seconds   Active Hamstring Stretch Limitations seated position   Single Knee to Chest Stretch 3 reps;20 seconds  Reviewed as part of HEP-pt independent     Discussed how to implement these stretches throughout the day to allow for improved flexibility.  Discussed and therapist demonstrated progression of seated hamstring stretches over time for  improved stretch.  Neuro Re-education:  Pt performs standing PWR! Moves Basic 4 with chair for support:  PWR! Up x 10 for improved posture, PWR! Rock x 10 for improved weightshifting, PWR! Twist x 10 for improved trunk rotation, and PWR! Step x 10 for improved initial stepping with gait.  Discussed and demonstrated how each of these movement patterns fits into functional movements and strategies for improved, large amplitude movement throughout pt's day.  Pt verbalizes understanding.  Pt performs sit<>stand from 18 and 20 inch surfaces with improved momentum and posture.  [place] activities with initial stepping activity for improved ability to decrease freezing with initiation of gait.    Provided instructions and discussion of various techniques to decrease freezing episodes with gait.           PT Education - 12/22/14 0734    Education provided Yes   Education Details Reviewed HEP; tips to reduce freezing episodes with standing and gait   Person(s) Educated Patient   Methods Explanation;Demonstration;Handout   Comprehension Verbalized understanding;Returned demonstration          PT Short Term Goals - 12/02/14 0820    PT SHORT TERM GOAL #1   Title Pt will be independent with HEP for improved transfers, balance,  and gait. (Target 12/30/14)   Time 4   Period Weeks   Status New   PT SHORT TERM GOAL #2   Title Pt will improve 5x sit<>stand to less than or equal to 11.5 seconds for improved transfer efficiency and safety.   Baseline 16.14 sec   Time 4   Period Weeks   Status New   PT SHORT TERM GOAL #3   Title Pt will improve Functional Gait Assessment score to at least 18/30 for decreased fall risk.   Baseline 14/30   Time 4   Period Weeks   Status New   PT SHORT TERM GOAL #4   Title Pt will improve TUG score to less than or equal to 13.5 seconds for decreased fall risk.   Baseline TUG 19.16 sec   Time 4   Period Weeks   Status New   PT SHORT TERM GOAL #5   Title  Pt will verbalize understanding of local Parkinson's-disease related resources.   Time 4   Period Weeks   Status New           PT Long Term Goals - 12/02/14 7412    PT LONG TERM GOAL #1   Title Pt will verbalize understanding of fall prevention techniques within the home environment. (Target 01/30/15)   Time 8   Period Weeks   Status New   PT LONG TERM GOAL #2   Title Pt will improve gait velocity to at least 2.62 ft/sec for improved gait efficiency and safety.   Time 8   Period Weeks   Status New   PT LONG TERM GOAL #3   Title Pt will improve Functional Gait Assessment to at least 22/30 for decreased fall risk.   Time 8   Period Weeks   Status New   PT LONG TERM GOAL #4   Title Pt will improve TUG cognitive score to less than or equal to 15 seconds for decreased fall risk.   Time 8   Period Weeks   Status New   PT LONG TERM GOAL #5   Title Pt will verbalize plans for continued community fitness upon D/C from PT.   Time 8   Period Weeks   Status New               Plan - 12/22/14 0735    Pt will benefit from skilled therapeutic intervention in order to improve on the following deficits Abnormal gait;Decreased activity tolerance;Decreased balance;Decreased endurance;Decreased mobility;Difficulty walking;Decreased strength;Improper body mechanics;Postural dysfunction   Rehab Potential Good   PT Frequency 2x / week   PT Duration 8 weeks  wk 2 of 8   PT Treatment/Interventions ADLs/Self Care Home Management;Gait training;Stair training;Functional mobility training;Therapeutic activities;Neuromuscular re-education;Balance training;Therapeutic exercise;Patient/family education   PT Next Visit Plan Review techniques to reduce freezing; work on gait and large amplitude standing movements   Consulted and Agree with Plan of Care Patient        Problem List Patient Active Problem List   Diagnosis Date Noted  . Sinus disease 07/28/2014  . MDD (major depressive  disorder), recurrent episode, severe 02/10/2014  . Nonspecific abnormal electrocardiogram (ECG) (EKG) 02/07/2014  . Non-compliant behavior 02/03/2014  . Other abnormal glucose 10/02/2013  . Morton's neuroma of left foot 08/07/2013  . Stress fracture of left foot 07/17/2013  . 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 Major depressive disorder, recurrent episode, severe 03/13/2012  . Hyperglycemia 01/26/2012  . Hypogonadism male 01/24/2012  . Syncope and  collapse 01/20/2012  .  OSA (obstructive sleep apnea) 01/17/2011  . CHEST TIGHTNESS 02/18/2008    Latausha Flamm W.for treatment provided on 12/19/14 12/22/2014, 7:42 AM Mady Haagensen, PT 12/22/2014 7:42 AM Phone: [phone] Fax: [facility] [address] [place], [place], [address] Phone: [phone]   Fax:  [phone]

## 2014-12-22 NOTE — Therapy (Signed)
[facility] [address] [place], [place], [address] Phone: [phone]   Fax:  [phone]  Occupational Therapy Treatment  Patient Details  Name: [name] [name] MRN: [id number] Date of Birth: 01-13-1951 Referring Provider:  Olga Millers, MD  Encounter Date: 12/22/2014      OT End of Session - 12/22/14 0810    Visit Number 5   Number of Visits 17   Date for OT Re-Evaluation 01/29/15   Authorization Type BCBS 75 visit limit combined, no auth   OT Start Time 0750   OT Stop Time 0830   OT Time Calculation (min) 40 min   Activity Tolerance Patient tolerated treatment well   Behavior During Therapy [facility] for tasks assessed/performed      Past Medical History  Diagnosis Date  . Headache(784.0)   . Cardiac murmur     as a child  . Streptococcal meningitis     as an infant  . Depression   .  OSA (obstructive sleep apnea) 01/17/2011    npsg 2012:  AHI 67/hr. Auto titration 2012:  Optimal pressure 12cm.   Marland Kitchen HEADACHES, HX OF 02/18/2008    Qualifier: Diagnosis of  By: Danny Lawless CMA, [place]    . APPENDECTOMY, HX OF 02/18/2008    Qualifier: Diagnosis of  By: Danny Lawless CMA, [place]    . Parkinson disease 11/2014    Past Surgical History  Procedure Laterality Date  . Nasal sinus surgery      x 4 as a child  . Vasectomy    . Appendectomy  1967    There were no vitals filed for this visit.  Visit Diagnosis:  Bradykinesia  Rigidity  Lack of coordination  Abnormal posture  Decreased functional mobility and endurance      Subjective Assessment - 12/22/14 0808    Symptoms Still having chest pain at work   Currently in Pain? No/denies                    OT Treatments/Exercises (OP) - 12/22/14 0001    ADLs   UB Dressing PWR! hands prior to buttoning/unbuttoning with min v.c.   LB Dressing Pulling bag into palm for simulated donning socks/clothing adjustment with each hand with min v.c. for  big movements            PWR [facility]) - 12/22/14 7026    PWR! exercises Moves in [place];Moves in prone   PWR! Up 10   PWR! Rock 10   PWR! Twist 10   PWR! Step 10   Comments x10 each (basic 4) in modified quadraped with min v.c.   PWR! Up 10   PWR! Rock 10   PWR! Twist 10   PWR! Step 10   Comments x10 each (basic 4) in prone with min v.c.                OT Short Term Goals - 12/01/14 0859    OT SHORT TERM GOAL #1   Title Pt will be independent with HEP--due 12/30/14   Time 4   Period Weeks   Status New   OT SHORT TERM GOAL #2   Title Pt will verbalize understanding of PD symptoms and ways to prevent future complications.--due 12/30/14   Time 4   Period Weeks   Status New   OT SHORT TERM GOAL #3   Title Pt will verbalize understanding of PD-specific appropriate community resources.--due 12/30/14   Time 4   Period Weeks  Status New   OT SHORT TERM GOAL #4   Title Pt will complete dressing fasteners mod I using strategies/AE prn.--due 12/30/14   Time 4   Period Weeks   Status New   OT SHORT TERM GOAL #5   Title Pt will improve coordination/functional reaching as shown by improving score on box and blocks test by at least 10 with RUE.--due 12/30/14   Baseline 37   Time 4   Period Weeks   Status New           OT Long Term Goals - 12/01/14 1443    OT LONG TERM GOAL #1   Title Pt will verbalize understanding of AE/strategies for ADLs/IADLs prn.--due 01/29/15   Time 8   Period Weeks   Status New   OT LONG TERM GOAL #2   Title Pt will improve coordination for ADLs as shown by improving time on 9-hole peg test by at least 5 bilaterally.--due 01/29/15   Baseline R-32.59sec, L-35.66sec   Time 8   Period Weeks   Status New   OT LONG TERM GOAL #3   Title Pt will improve coordination/functional reaching as shown by improving score on box and blocks test by at least 5 with LUE.--due 01/29/15   Baseline 45   Time 8   Period Weeks   Status New   OT LONG TERM  GOAL #4   Title Pt will improve coordination/functional reaching as shown by improving score on box and blocks test by at least 10 with RUE.--due 01/29/15   Baseline 37   Time 8   Period Weeks   Status New   OT LONG TERM GOAL #5   Title Pt will improve dressing ability as shown by improving time on PPT#4 by at least 5sec.--due 01/29/15   Baseline 24.53sec   Time 8   Period Weeks   Status New   Long Term Additional Goals   Additional Long Term Goals Yes   OT LONG TERM GOAL #6   Title Pt will improve bradykinesia as shown by improving time on PPT#2 by at least 5sec.--due 01/29/15   Baseline 15.35   Time 8   Period Weeks   Status New               Plan - 12/22/14 0813    Clinical Impression Statement Pt is progressing towards goals and is responding well to cues for big movement.   Plan PWR! Moves in prone and quadraped (in able or modified quadraped in needed) and add to HEP if appropriate   OT Home Exercise Plan PWR! moves seated issued last week, reviewed coordination/ PWR! hands HEP and added to HEP-12/19/14        Problem List Patient Active Problem List   Diagnosis Date Noted  . Sinus disease 07/28/2014  . MDD (major depressive disorder), recurrent episode, severe 02/10/2014  . Nonspecific abnormal electrocardiogram (ECG) (EKG) 02/07/2014  . Non-compliant behavior 02/03/2014  . Other abnormal glucose 10/02/2013  . Morton's neuroma of left foot 08/07/2013  . Stress fracture of left foot 07/17/2013  . 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 Major depressive disorder, recurrent episode, severe 03/13/2012  . Hyperglycemia 01/26/2012  . Hypogonadism male 01/24/2012  . Syncope and collapse 01/20/2012  .  OSA (obstructive sleep apnea) 01/17/2011  . CHEST TIGHTNESS 02/18/2008    [facility] 12/22/2014, 8:44 AM  [facility] [address] [place], [place],  [address] Phone: [phone]   Fax:  [place] [facility] [place], OTR/L 12/22/2014 8:45 AM

## 2014-12-23 ENCOUNTER — Encounter: Payer: Self-pay | Admitting: Occupational Therapy

## 2014-12-23 NOTE — Therapy (Signed)
[facility] [address] [place], [place], [address] Phone: [phone]   Fax:  [phone]  Physical Therapy Treatment  Patient Details  Name: [name] [name] MRN: [id number] Date of Birth: [birth year] Referring Provider:  Olga Millers, MD  Encounter Date: 12/22/2014      PT End of Session - 12/23/14 1302    Visit Number 6   Number of Visits 17   Date for PT Re-Evaluation 01/30/15   Authorization Type BCBS 75 visit limit combined   PT Start Time 0937   PT Stop Time 1015   PT Time Calculation (min) 38 min   Equipment Utilized During Treatment Gait belt   Activity Tolerance Patient tolerated treatment well      Past Medical History  Diagnosis Date  . Headache(784.0)   . Cardiac murmur     as a child  . Streptococcal meningitis     as an infant  . Depression   .  OSA (obstructive sleep apnea) 01/17/2011    npsg 2012:  AHI 67/hr. Auto titration 2012:  Optimal pressure 12cm.   Marland Kitchen HEADACHES, HX OF 02/18/2008    Qualifier: Diagnosis of  By: Danny Lawless CMA, [place]    . APPENDECTOMY, HX OF 02/18/2008    Qualifier: Diagnosis of  By: Danny Lawless CMA, [place]    . Parkinson disease 11/2014    Past Surgical History  Procedure Laterality Date  . Nasal sinus surgery      x 4 as a child  . Vasectomy    . Appendectomy  1967    There were no vitals filed for this visit.  Visit Diagnosis:  Bradykinesia  Rigidity  Abnormality of gait      Subjective Assessment - 12/22/14 0940    Symptoms Worked all weekend, tired this morning; no pain   Currently in Pain? No/denies                       [facility] Adult PT Treatment/Exercise - 12/22/14 0941    Transfers   Transfers Sit to Stand;Stand to Sit   Sit to Stand From bed;7: Independent;With upper extremity assist;From chair/3-in-1  PWR! Up to stand, 10 reps each surface   Stand to Sit 7: Independent;Without upper extremity assist;To bed;To chair/3-in-1    Ambulation/Gait   Ambulation/Gait Yes   Ambulation/Gait Assistance 6: Modified independent (Device/Increase time)   Ambulation Distance (Feet) 200 Feet  3 reps   Assistive device --  used bilateral walking poles to facilitate UE arm swing   Gait Pattern Decreased step length - right;Decreased step length - left;Decreased dorsiflexion - left;Shuffle;Trunk flexed;Narrow base of support;Poor foot clearance - left  cues for improved posture,step length, foot clear, arm swing   Gait Comments Attempted gait training with walking poles x 200 ft, however, pt has difficulty with reciprocal sequence   High Level Balance   High Level Balance Activities Side stepping;Backward walking  forward walking, 3 reps 10 ft in bars, marching x 10 reps           [facility]) - 12/22/14 0947    PWR! exercises Moves in standing   PWR! Up x10   PWR! Rock 10   PWR! Twist 10   PWR Step 10  in parallel bars, forward and back directions x 10 each             PT Education - 12/22/14 0734    Education provided Yes   Education Details Reviewed HEP; tips  to reduce freezing episodes with standing and gait   Person(s) Educated Patient   Methods Explanation;Demonstration;Handout   Comprehension Verbalized understanding;Returned demonstration          PT Short Term Goals - 12/02/14 0820    PT SHORT TERM GOAL #1   Title Pt will be independent with HEP for improved transfers, balance, and gait. (Target 12/30/14)   Time 4   Period Weeks   Status New   PT SHORT TERM GOAL #2   Title Pt will improve 5x sit<>stand to less than or equal to 11.5 seconds for improved transfer efficiency and safety.   Baseline 16.14 sec   Time 4   Period Weeks   Status New   PT SHORT TERM GOAL #3   Title Pt will improve Functional Gait Assessment score to at least 18/30 for decreased fall risk.   Baseline 14/30   Time 4   Period Weeks   Status New   PT SHORT TERM GOAL #4   Title Pt will improve TUG score to less than  or equal to 13.5 seconds for decreased fall risk.   Baseline TUG 19.16 sec   Time 4   Period Weeks   Status New   PT SHORT TERM GOAL #5   Title Pt will verbalize understanding of local Parkinson's-disease related resources.   Time 4   Period Weeks   Status New           PT Long Term Goals - 12/02/14 1610    PT LONG TERM GOAL #1   Title Pt will verbalize understanding of fall prevention techniques within the home environment. (Target 01/30/15)   Time 8   Period Weeks   Status New   PT LONG TERM GOAL #2   Title Pt will improve gait velocity to at least 2.62 ft/sec for improved gait efficiency and safety.   Time 8   Period Weeks   Status New   PT LONG TERM GOAL #3   Title Pt will improve Functional Gait Assessment to at least 22/30 for decreased fall risk.   Time 8   Period Weeks   Status New   PT LONG TERM GOAL #4   Title Pt will improve TUG cognitive score to less than or equal to 15 seconds for decreased fall risk.   Time 8   Period Weeks   Status New   PT LONG TERM GOAL #5   Title Pt will verbalize plans for continued community fitness upon D/C from PT.   Time 8   Period Weeks   Status New               Plan - 12/23/14 1304    Clinical Impression Statement Pt appears to have increased overall ease of movement with standing balance activities in parallel bars, but pt does have significant difficulty with coordination of bilateral walking poles.   Pt will benefit from skilled therapeutic intervention in order to improve on the following deficits Abnormal gait;Decreased activity tolerance;Decreased balance;Decreased endurance;Decreased mobility;Difficulty walking;Decreased strength;Improper body mechanics;Postural dysfunction   Rehab Potential Good   PT Frequency 2x / week   PT Duration 8 weeks  this is week 3 of 8   PT Treatment/Interventions ADLs/Self Care Home Management;Gait training;Stair training;Functional mobility training;Therapeutic  activities;Neuromuscular re-education;Balance training;Therapeutic exercise;Patient/family education   PT Next Visit Plan gait, large amplitude standing movements and balance activities   Consulted and Agree with Plan of Care Patient        Problem List Patient  Active Problem List   Diagnosis Date Noted  . Sinus disease 07/28/2014  . MDD (major depressive disorder), recurrent episode, severe 02/10/2014  . Nonspecific abnormal electrocardiogram (ECG) (EKG) 02/07/2014  . Non-compliant behavior 02/03/2014  . Other abnormal glucose 10/02/2013  . Morton's neuroma of left foot 08/07/2013  . Stress fracture of left foot 07/17/2013  . 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 Major depressive disorder, recurrent episode, severe 03/13/2012  . Hyperglycemia 01/26/2012  . Hypogonadism male 01/24/2012  . Syncope and collapse 01/20/2012  .  OSA (obstructive sleep apnea) 01/17/2011  . CHEST TIGHTNESS 02/18/2008    Adithi Gammon W. 12/23/2014, 1:07 PM  Mady Haagensen, PT 12/23/2014 1:08 PM Phone: [phone] Fax: [facility] [address] [place], [place], [address] Phone: [phone]   Fax:  [phone]

## 2014-12-24 ENCOUNTER — Ambulatory Visit: Payer: BLUE CROSS/BLUE SHIELD

## 2014-12-24 ENCOUNTER — Ambulatory Visit: Payer: BLUE CROSS/BLUE SHIELD | Admitting: Occupational Therapy

## 2014-12-24 ENCOUNTER — Ambulatory Visit (INDEPENDENT_AMBULATORY_CARE_PROVIDER_SITE_OTHER): Payer: BLUE CROSS/BLUE SHIELD | Admitting: Psychiatry

## 2014-12-24 ENCOUNTER — Ambulatory Visit: Payer: BLUE CROSS/BLUE SHIELD | Admitting: Physical Therapy

## 2014-12-24 DIAGNOSIS — R29898 Other symptoms and signs involving the musculoskeletal system: Secondary | ICD-10-CM

## 2014-12-24 DIAGNOSIS — G20A1 Parkinson's disease without dyskinesia, without mention of fluctuations: Secondary | ICD-10-CM

## 2014-12-24 DIAGNOSIS — G20 Parkinson's disease: Secondary | ICD-10-CM

## 2014-12-24 DIAGNOSIS — R269 Unspecified abnormalities of gait and mobility: Secondary | ICD-10-CM

## 2014-12-24 DIAGNOSIS — F332 Major depressive disorder, recurrent severe without psychotic features: Secondary | ICD-10-CM | POA: Diagnosis not present

## 2014-12-24 DIAGNOSIS — R490 Dysphonia: Secondary | ICD-10-CM

## 2014-12-24 DIAGNOSIS — R258 Other abnormal involuntary movements: Secondary | ICD-10-CM

## 2014-12-24 DIAGNOSIS — R279 Unspecified lack of coordination: Secondary | ICD-10-CM

## 2014-12-24 NOTE — Therapy (Signed)
[facility] [address] [place], [place], [address] Phone: [phone]   Fax:  [phone]  Occupational Therapy Treatment  Patient Details  Name: [name] [name] MRN: [id number] Date of Birth: Feb 10, 1951 Referring Provider:  Olga Millers, MD  Encounter Date: 12/24/2014      OT End of Session - 12/24/14 0810    Visit Number 6   Number of Visits 17   Authorization Type BCBS 75 visit limit combined, no auth   OT Start Time 0804   OT Stop Time 0845   OT Time Calculation (min) 41 min   Activity Tolerance Patient tolerated treatment well   Behavior During Therapy [facility] for tasks assessed/performed      Past Medical History  Diagnosis Date  . Headache(784.0)   . Cardiac murmur     as a child  . Streptococcal meningitis     as an infant  . Depression   .  OSA (obstructive sleep apnea) 01/17/2011    npsg 2012:  AHI 67/hr. Auto titration 2012:  Optimal pressure 12cm.   Marland Kitchen HEADACHES, HX OF 02/18/2008    Qualifier: Diagnosis of  By: Danny Lawless CMA, [place]    . APPENDECTOMY, HX OF 02/18/2008    Qualifier: Diagnosis of  By: Danny Lawless CMA, [place]    . Parkinson disease 11/2014    Past Surgical History  Procedure Laterality Date  . Nasal sinus surgery      x 4 as a child  . Vasectomy    . Appendectomy  1967    There were no vitals filed for this visit.  Visit Diagnosis:  Bradykinesia  Rigidity  Lack of coordination      Subjective Assessment - 12/24/14 0810    Pertinent History Diagnosed with Parkinson's approx 1 month ago.   Currently in Pain? No/denies                       [facility]) - 12/24/14 0818    PWR! exercises Moves in prone;Moves in [place]! Up 10   PWR! Rock 10   PWR! Twist 10   PWR! Step 10   Comments performed in modified quadraped, min v.c.   PWR! Up 20   PWR! Rock 20   PWR! Twist 20   PWR! Step 10   Comments min v.c. for each exercise     Theraputic  activities: copying small peg design on vertical surface with bilateral UE's, min difficulty/ v.c. For increased fine motor coordination with cognitive component, pt demonstrates  greater difficulty with LUE, min drops..          OT Short Term Goals - 12/01/14 0859    OT SHORT TERM GOAL #1   Title Pt will be independent with HEP--due 12/30/14   Time 4   Period Weeks   Status New   OT SHORT TERM GOAL #2   Title Pt will verbalize understanding of PD symptoms and ways to prevent future complications.--due 12/30/14   Time 4   Period Weeks   Status New   OT SHORT TERM GOAL #3   Title Pt will verbalize understanding of PD-specific appropriate community resources.--due 12/30/14   Time 4   Period Weeks   Status New   OT SHORT TERM GOAL #4   Title Pt will complete dressing fasteners mod I using strategies/AE prn.--due 12/30/14   Time 4   Period Weeks   Status New   OT SHORT TERM GOAL #5  Title Pt will improve coordination/functional reaching as shown by improving score on box and blocks test by at least 10 with RUE.--due 12/30/14   Baseline 37   Time 4   Period Weeks   Status New           OT Long Term Goals - 12/01/14 0903    OT LONG TERM GOAL #1   Title Pt will verbalize understanding of AE/strategies for ADLs/IADLs prn.--due 01/29/15   Time 8   Period Weeks   Status New   OT LONG TERM GOAL #2   Title Pt will improve coordination for ADLs as shown by improving time on 9-hole peg test by at least 5 bilaterally.--due 01/29/15   Baseline R-32.59sec, L-35.66sec   Time 8   Period Weeks   Status New   OT LONG TERM GOAL #3   Title Pt will improve coordination/functional reaching as shown by improving score on box and blocks test by at least 5 with LUE.--due 01/29/15   Baseline 45   Time 8   Period Weeks   Status New   OT LONG TERM GOAL #4   Title Pt will improve coordination/functional reaching as shown by improving score on box and blocks test by at least 10 with RUE.--due  01/29/15   Baseline 37   Time 8   Period Weeks   Status New   OT LONG TERM GOAL #5   Title Pt will improve dressing ability as shown by improving time on PPT#4 by at least 5sec.--due 01/29/15   Baseline 24.53sec   Time 8   Period Weeks   Status New   Long Term Additional Goals   Additional Long Term Goals Yes   OT LONG TERM GOAL #6   Title Pt will improve bradykinesia as shown by improving time on PPT#2 by at least 5sec.--due 01/29/15   Baseline 15.35   Time 8   Period Weeks   Status New               Plan - 12/24/14 6433    Clinical Impression Statement Pt is progressing well towards goals.   OT Home Exercise Plan Has been issued:  PWR! moves seated, coordination/ PWR! hands HEP    Consulted and Agree with Plan of Care Patient        Problem List Patient Active Problem List   Diagnosis Date Noted  . Sinus disease 07/28/2014  . MDD (major depressive disorder), recurrent episode, severe 02/10/2014  . Nonspecific abnormal electrocardiogram (ECG) (EKG) 02/07/2014  . Non-compliant behavior 02/03/2014  . Other abnormal glucose 10/02/2013  . Morton's neuroma of left foot 08/07/2013  . Stress fracture of left foot 07/17/2013  . 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 Major depressive disorder, recurrent episode, severe 03/13/2012  . Hyperglycemia 01/26/2012  . Hypogonadism male 01/24/2012  . Syncope and collapse 01/20/2012  .  OSA (obstructive sleep apnea) 01/17/2011  . CHEST TIGHTNESS 02/18/2008    Mckenlee Mangham 12/24/2014, 8:33 AM Theone Murdoch, OTR/L [phone] Phone: [phone] 8:35 AM 12/24/2014 [facility] [address] [place], [place], [address] Phone: [phone]   Fax:  [phone]

## 2014-12-24 NOTE — Therapy (Signed)
[facility] [address] [place], [place], [address] Phone: [phone]   Fax:  [phone]  Speech Language Pathology Treatment  Patient Details  Name: [name] [name] MRN: [id number] Date of Birth: 1951-05-23 Referring Provider:  Olga Millers, MD  Encounter Date: 12/24/2014      End of Session - 12/24/14 1025    SLP Start Time 0936   SLP Stop Time  1017   SLP Time Calculation (min) 41 min   Activity Tolerance Patient tolerated treatment well      Past Medical History  Diagnosis Date  . Headache(784.0)   . Cardiac murmur     as a child  . Streptococcal meningitis     as an infant  . Depression   .  OSA (obstructive sleep apnea) 01/17/2011    npsg 2012:  AHI 67/hr. Auto titration 2012:  Optimal pressure 12cm.   Marland Kitchen HEADACHES, HX OF 02/18/2008    Qualifier: Diagnosis of  By: Danny Lawless CMA, [place]    . APPENDECTOMY, HX OF 02/18/2008    Qualifier: Diagnosis of  By: Danny Lawless CMA, [place]    . Parkinson disease 11/2014    Past Surgical History  Procedure Laterality Date  . Nasal sinus surgery      x 4 as a child  . Vasectomy    . Appendectomy  1967    There were no vitals filed for this visit.  Visit Diagnosis: Hypokinetic Parkinsonian dysphonia      Subjective Assessment - 12/24/14 0949    Symptoms Exercises completed once since Monday.               ADULT SLP TREATMENT - 12/24/14 1002    General Information   Behavior/Cognition Alert;Cooperative;Pleasant mood   Treatment Provided   Treatment provided Cognitive-Linquistic   Pain Assessment   Pain Assessment No/denies pain   Cognitive-Linquistic Treatment   Treatment focused on Dysarthria   Skilled Treatment Average loud /a/ 82dB with usual min-mod A for breathing and abdominal push. Conversation prior to loud /a/ average 68dB. Pt aware of low volume but states "something is keeping me from getting louder." SLP educated/reminded pt that  abdominal push/volume will likely take care of that and had pt perform 3 more loud /a/ with "hey!" initially. SLP initiated practice wiht louder voice in sentences with pt success 95% with occasional min A faded to rare min A  by task end.   Assessment / Recommendations / Plan   Plan Continue with current plan of care   Progression Toward Goals   Progression toward goals Progressing toward goals          SLP Education - 12/24/14 1017    Education provided Yes   Education Details performing HEP necessary BID, target of ST to behaviorally habiltualize incr'd loudness   Person(s) Educated Patient   Methods Explanation   Comprehension Verbalized understanding          SLP Short Term Goals - 12/24/14 1109    SLP SHORT TERM GOAL #1   Title Pt will demonstrate abdominal breathing in structured simple conversation for 10 minutes with occassional minimal cues.12/29/14   Time 2   Period Weeks   Status On-going   SLP SHORT TERM GOAL #2   Title Pt will demonstrate average of 84dB for sustained /a/ over 3 sessions with rare minimal assistance (12/29/14)   Time 2   Period Weeks   Status On-going   SLP SHORT TERM GOAL #3   Title  Pt will average 70dB in simple structured speech tasks over 20 minutes with occassional minimal assistance (12/29/14)   Status Achieved          SLP Long Term Goals - 12/24/14 1110    SLP LONG TERM GOAL #1   Title Demonstrate abdominal breathing for mildly complex conversation for 15 minutes with occassional minimal cues (01/29/15)   Time 6   Period Weeks   Status On-going   SLP LONG TERM GOAL #2   Title Pt will maintain an average of 70dBfor mildly complex conversation over 15 minutes with rare minimal assistance (01/29/15)   Time 6   Period Weeks   Status On-going   SLP LONG TERM GOAL #3   Title Pt will converse audibly in noisy environment for 15 minutes with rare minimal assistance (01/29/15)   Time 6   Period Weeks   Status On-going          Plan  - 12/24/14 1106    Clinical Impression Statement . Loud /a/  cont necessary to pair with initial "Hey!" for pt to feel abdominal push, pt beginning to transfer louder speech to between tasks/repetitions of same task during therapy session, but not upon entrance into ST room. Pt is generating improved facial expresssion.    Speech Therapy Frequency 2x / week   Duration --  5 weeks   Treatment/Interventions Compensatory strategies;Functional tasks;Patient/family education;SLP instruction and feedback;Internal/external aids   Potential to Achieve Goals Fair   Potential Considerations Severity of impairments        Problem List Patient Active Problem List   Diagnosis Date Noted  . Sinus disease 07/28/2014  . MDD (major depressive disorder), recurrent episode, severe 02/10/2014  . Nonspecific abnormal electrocardiogram (ECG) (EKG) 02/07/2014  . Non-compliant behavior 02/03/2014  . Other abnormal glucose 10/02/2013  . Morton's neuroma of left foot 08/07/2013  . Stress fracture of left foot 07/17/2013  . 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 Major depressive disorder, recurrent episode, severe 03/13/2012  . Hyperglycemia 01/26/2012  . Hypogonadism male 01/24/2012  . Syncope and collapse 01/20/2012  .  OSA (obstructive sleep apnea) 01/17/2011  . CHEST TIGHTNESS 02/18/2008    Ellee Wawrzyniak,SLP 12/24/2014, 11:11 AM  [facility] [address] [place], [place], [address] Phone: [phone]   Fax:  [phone]

## 2014-12-24 NOTE — Patient Instructions (Signed)
  Please complete the assigned speech therapy homework prior to your next session.  

## 2014-12-25 NOTE — Progress Notes (Signed)
   THERAPIST PROGRESS NOTE Session Time: 3:30-4:30  Participation Level: Minimal  Behavioral Response: CasualAlertFlatAffect with Mildly improved mood  Type of Therapy: Individual Therapy  Treatment Goals addressed: Anxiety, Depression  Interventions: Solution Focused  Summary: [name] [name] is a 64 y.o. male who presents with major depressive disorder, panic disorder   Suicidal/Homicidal: Nowithout intent/plan  Therapist Response: Pt. Came to session by himself. Pt. Continues to present with primarily flat affect, but with much improved mood compared to last session. Pt. Reports that he received Parkinson's diagnosis which has been difficult for his family, but feels hopeful with correct diagnosis and physical therapy that he can get the help that he needs. Pt. Reports that he received job transfer that he believes will be less stressful, will have better work hours from 2-10:30, and will allow him to sleep at night. Pt. Reports that he is setting better work/life boundaries by refusing overtime work. Pt. Reports that he has experienced some mild anxiety especially related to driving but that he uses breathing exercises to help manage. Pt. Continues to be focused on completing the next four years with current employer so that he and his wife can relocate to [place].   Plan: Continue with solution focused based therapy. Return again in 3-4 weeks.  Diagnosis:Axis I: Depressive Disorder NOS  Axis II: No diagnosis     Nancie Neas, [facility] 12/25/2014

## 2014-12-25 NOTE — Therapy (Signed)
[place] [facility] [address] [place], [place], [address] Phone: [phone]   Fax:  [phone]  Physical Therapy Treatment  Patient Details  Name: [name] [name] MRN: [id number] Date of Birth: 01-13-1951 Referring Provider:  Olga Millers, MD  Encounter Date: 12/24/2014      PT End of Session - 12/25/14 1252    Visit Number 7   Number of Visits 17   Date for PT Re-Evaluation 01/30/15   Authorization Type BCBS 75 visit limit combined   PT Start Time 0850   PT Stop Time 0930   PT Time Calculation (min) 40 min   Activity Tolerance Patient tolerated treatment well   Behavior During Therapy [facility] for tasks assessed/performed      Past Medical History  Diagnosis Date  . Headache(784.0)   . Cardiac murmur     as a child  . Streptococcal meningitis     as an infant  . Depression   .  OSA (obstructive sleep apnea) 01/17/2011    npsg 2012:  AHI 67/hr. Auto titration 2012:  Optimal pressure 12cm.   Marland Kitchen HEADACHES, HX OF 02/18/2008    Qualifier: Diagnosis of  By: Danny Lawless CMA, [place]    . APPENDECTOMY, HX OF 02/18/2008    Qualifier: Diagnosis of  By: Danny Lawless CMA, [place]    . Parkinson disease 11/2014    Past Surgical History  Procedure Laterality Date  . Nasal sinus surgery      x 4 as a child  . Vasectomy    . Appendectomy  1967    There were no vitals filed for this visit.  Visit Diagnosis:  Abnormality of gait      Subjective Assessment - 12/24/14 0851    Symptoms Got visits scheduled through end of April   Currently in Pain? No/denies                       [facility] Adult PT Treatment/Exercise - 12/24/14 2671    Ambulation/Gait   Ambulation/Gait Yes   Ambulation/Gait Assistance 7: Independent   Ambulation Distance (Feet) 450 Feet  treadmill 1 mph UE support x 3 minutes at 1 mph   Assistive device None   Gait Pattern --  treadmill cues for increased step length/heelstrike   Ambulation  Surface Level;Indoor   Gait Comments Gait activities with start/stops with cues for weightshifting to start with large steps; 4-square step activity for improved change of directions and regaining balance with quick start/stops in varied directions   High Level Balance   High Level Balance Activities Side stepping;Marching forwards;Marching backwards  Marching; forward/back walking   High Level Balance Comments At counter-forward step and weightshift, side step and weight shift over hurdle and balance disks, 15 reps each  Cues for increased awareness of large amplitude of LLE                PT Education - 12/25/14 1251    Education provided Yes   Education Details increased awareness of large amplitude movements on LLE for improved foot clearance with gait   Person(s) Educated Patient   Methods Explanation;Demonstration   Comprehension Verbalized understanding;Returned demonstration          PT Short Term Goals - 12/02/14 0820    PT SHORT TERM GOAL #1   Title Pt will be independent with HEP for improved transfers, balance, and gait. (Target 12/30/14)   Time 4   Period Weeks   Status New  PT SHORT TERM GOAL #2   Title Pt will improve 5x sit<>stand to less than or equal to 11.5 seconds for improved transfer efficiency and safety.   Baseline 16.14 sec   Time 4   Period Weeks   Status New   PT SHORT TERM GOAL #3   Title Pt will improve Functional Gait Assessment score to at least 18/30 for decreased fall risk.   Baseline 14/30   Time 4   Period Weeks   Status New   PT SHORT TERM GOAL #4   Title Pt will improve TUG score to less than or equal to 13.5 seconds for decreased fall risk.   Baseline TUG 19.16 sec   Time 4   Period Weeks   Status New   PT SHORT TERM GOAL #5   Title Pt will verbalize understanding of local Parkinson's-disease related resources.   Time 4   Period Weeks   Status New           PT Long Term Goals - 12/02/14 9381    PT LONG TERM GOAL #1    Title Pt will verbalize understanding of fall prevention techniques within the home environment. (Target 01/30/15)   Time 8   Period Weeks   Status New   PT LONG TERM GOAL #2   Title Pt will improve gait velocity to at least 2.62 ft/sec for improved gait efficiency and safety.   Time 8   Period Weeks   Status New   PT LONG TERM GOAL #3   Title Pt will improve Functional Gait Assessment to at least 22/30 for decreased fall risk.   Time 8   Period Weeks   Status New   PT LONG TERM GOAL #4   Title Pt will improve TUG cognitive score to less than or equal to 15 seconds for decreased fall risk.   Time 8   Period Weeks   Status New   PT LONG TERM GOAL #5   Title Pt will verbalize plans for continued community fitness upon D/C from PT.   Time 8   Period Weeks   Status New               Plan - 12/25/14 1252    Clinical Impression Statement With slowed gait pattern and increased awareness of step length and foot clearance on LLE, pt demonstrates improved overall gait pattern.  Pt will continue to benefit from further skilled PT to further address balance, gait and functional strength.   Pt will benefit from skilled therapeutic intervention in order to improve on the following deficits Abnormal gait;Decreased activity tolerance;Decreased balance;Decreased endurance;Decreased mobility;Difficulty walking;Decreased strength;Improper body mechanics;Postural dysfunction   Rehab Potential Good   PT Frequency 2x / week   PT Duration 8 weeks  wk 3 of 8   PT Treatment/Interventions ADLs/Self Care Home Management;Gait training;Stair training;Functional mobility training;Therapeutic activities;Neuromuscular re-education;Balance training;Therapeutic exercise;Patient/family education   PT Next Visit Plan Check goals next week; gait, large amplitude standing movements and balance activities; treadmill gait, 4-square step/large amplitude step activities   Consulted and Agree with Plan of Care  Patient        Problem List Patient Active Problem List   Diagnosis Date Noted  . Sinus disease 07/28/2014  . MDD (major depressive disorder), recurrent episode, severe 02/10/2014  . Nonspecific abnormal electrocardiogram (ECG) (EKG) 02/07/2014  . Non-compliant behavior 02/03/2014  . Other abnormal glucose 10/02/2013  . Morton's neuroma of left foot 08/07/2013  . Stress fracture of left foot 07/17/2013  .  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 Major depressive disorder, recurrent episode, severe 03/13/2012  . Hyperglycemia 01/26/2012  . Hypogonadism male 01/24/2012  . Syncope and collapse 01/20/2012  .  OSA (obstructive sleep apnea) 01/17/2011  . CHEST TIGHTNESS 02/18/2008    Aolanis Crispen W. 12/25/2014, 12:57 PM  Mady Haagensen, PT 12/25/2014 1:00 PM Phone: [phone] Fax: [place] [address] [place], [place], [address] Phone: [phone]   Fax:  [phone]

## 2014-12-29 ENCOUNTER — Encounter: Payer: Self-pay | Admitting: Occupational Therapy

## 2014-12-29 ENCOUNTER — Ambulatory Visit: Payer: BLUE CROSS/BLUE SHIELD

## 2014-12-29 ENCOUNTER — Ambulatory Visit: Payer: BLUE CROSS/BLUE SHIELD | Admitting: Occupational Therapy

## 2014-12-29 DIAGNOSIS — R293 Abnormal posture: Secondary | ICD-10-CM

## 2014-12-29 DIAGNOSIS — Z7409 Other reduced mobility: Secondary | ICD-10-CM

## 2014-12-29 DIAGNOSIS — R279 Unspecified lack of coordination: Secondary | ICD-10-CM

## 2014-12-29 DIAGNOSIS — R29898 Other symptoms and signs involving the musculoskeletal system: Secondary | ICD-10-CM

## 2014-12-29 DIAGNOSIS — G20 Parkinson's disease: Secondary | ICD-10-CM | POA: Diagnosis not present

## 2014-12-29 DIAGNOSIS — R258 Other abnormal involuntary movements: Secondary | ICD-10-CM

## 2014-12-29 DIAGNOSIS — R490 Dysphonia: Secondary | ICD-10-CM

## 2014-12-29 DIAGNOSIS — G20A1 Parkinson's disease without dyskinesia, without mention of fluctuations: Secondary | ICD-10-CM

## 2014-12-29 NOTE — Therapy (Signed)
[facility] [address] [place], [place], [address] Phone: [phone]   Fax:  [phone]  Physical Therapy Treatment  Patient Details  Name: [name] [name] MRN: [id number] Date of Birth: 29-Dec-1950 Referring Provider:  Olga Millers, MD  Encounter Date: 12/29/2014      PT End of Session - 12/29/14 1048    Visit Number 8   Number of Visits 17   Date for PT Re-Evaluation 01/30/15   Authorization Type [employer]BS 75 visit limit combined   PT Start Time 0935   PT Stop Time 1015   PT Time Calculation (min) 40 min      Past Medical History  Diagnosis Date  . Headache(784.0)   . Cardiac murmur     as a child  . Streptococcal meningitis     as an infant  . Depression   .  OSA (obstructive sleep apnea) 01/17/2011    npsg 2012:  AHI 67/hr. Auto titration 2012:  Optimal pressure 12cm.   Marland Kitchen HEADACHES, HX OF 02/18/2008    Qualifier: Diagnosis of  By: Danny Lawless CMA, [place]    . APPENDECTOMY, HX OF 02/18/2008    Qualifier: Diagnosis of  By: Danny Lawless CMA, [place]    . Parkinson disease 11/2014    Past Surgical History  Procedure Laterality Date  . Nasal sinus surgery      x 4 as a child  . Vasectomy    . Appendectomy  1967    There were no vitals filed for this visit.  Visit Diagnosis:  Lack of coordination  Decreased functional mobility and endurance      Subjective Assessment - 12/29/14 0937    Symptoms No pain, falls, or new complaints but feeling tired today.   Currently in Pain? No/denies            [facility] PT Assessment - 12/29/14 0001    Timed Up and Go Test   Normal TUG (seconds) 9.59   Functional Gait  Assessment   Gait assessed  Yes   Gait Level Surface Walks 20 ft in less than 7 sec but greater than 5.5 sec, uses assistive device, slower speed, mild gait deviations, or deviates 6-10 in outside of the 12 in walkway width.   Change in Gait Speed Able to smoothly change walking speed without  loss of balance or gait deviation. Deviate no more than 6 in outside of the 12 in walkway width.   Gait with Horizontal Head Turns Performs head turns smoothly with slight change in gait velocity (eg, minor disruption to smooth gait path), deviates 6-10 in outside 12 in walkway width, or uses an assistive device.   Gait with Vertical Head Turns Performs head turns with no change in gait. Deviates no more than 6 in outside 12 in walkway width.   Gait and Pivot Turn Pivot turns safely within 3 sec and stops quickly with no loss of balance.   Step Over Obstacle Is able to step over 2 stacked shoe boxes taped together (9 in total height) without changing gait speed. No evidence of imbalance.   Gait with Narrow Base of Support Ambulates 7-9 steps.   Gait with Eyes Closed Walks 20 ft, uses assistive device, slower speed, mild gait deviations, deviates 6-10 in outside 12 in walkway width. Ambulates 20 ft in less than 9 sec but greater than 7 sec.   Ambulating Backwards Walks 20 ft, uses assistive device, slower speed, mild gait deviations, deviates 6-10 in outside 12  in walkway width.   Steps Alternating feet, must use rail.   Total Score 24     Checked short term therapy goals-see above for TUG, Functional gait assessment. 5x sit to stand time was 13.27.  Reviewed all PT HEP exercises provided since eval. Pt required minimal cueing. Performed 3 repetitions of each exercise to evaluate level of independence with these.                        PT Short Term Goals - 12/29/14 0100    PT SHORT TERM GOAL #1   Title Pt will be independent with HEP for improved transfers, balance, and gait. (Target 12/30/14)   Time 4   Period Weeks   Status Partially Met  Pt is performing HEP consistently, required minimal cueing for correct form on some of the PWR! exercises. Added notes for improved performance to the exercise handout   PT SHORT TERM GOAL #2   Title Pt will improve 5x sit<>stand to less  than or equal to 11.5 seconds for improved transfer efficiency and safety.   Baseline 16.14 sec   Time 4   Period Weeks   Status Not Met  13.27 seconds on 12/29/14   PT SHORT TERM GOAL #3   Title Pt will improve Functional Gait Assessment score to at least 18/30 for decreased fall risk.   Baseline 14/30   Time 4   Period Weeks   Status Achieved  scored 24/30 on 12/29/14   PT SHORT TERM GOAL #4   Title Pt will improve TUG score to less than or equal to 13.5 seconds for decreased fall risk.   Baseline TUG 19.16 sec   Time 4   Period Weeks   Status Achieved  9.59 on 12/29/14   PT SHORT TERM GOAL #5   Title Pt will verbalize understanding of local Parkinson's-disease related resources.   Time 4   Period Weeks   Status On-going           PT Long Term Goals - 12/29/14 1053    PT LONG TERM GOAL #1   Title Pt will verbalize understanding of fall prevention techniques within the home environment. (Target 01/30/15)   Time 8   Period Weeks   Status New   PT LONG TERM GOAL #2   Title Pt will improve gait velocity to at least 2.62 ft/sec for improved gait efficiency and safety.   Time 8   Period Weeks   Status New   PT LONG TERM GOAL #3   Title Pt will improve Functional Gait Assessment to at least 22/30 for decreased fall risk.   Time 8   Period Weeks   Status Achieved  24/30 on 12/29/14   PT LONG TERM GOAL #4   Title Pt will improve TUG cognitive score to less than or equal to 15 seconds for decreased fall risk.   Time 8   Period Weeks   Status New   PT LONG TERM GOAL #5   Title Pt will verbalize plans for continued community fitness upon D/C from PT.   Time 8   Period Weeks   Status New               Plan - 12/29/14 1049    Clinical Impression Statement Pt most short term therapy goals today. He made significant improvement in TUG and functional gait assessment indicating significantly lower risk of falls than he had at start of therapy. Continue per  plan of care  to maximize functional independene.   PT Next Visit Plan Check final short term goal; gait, large amplitude standing movements and balance activities; treadmill gait, 4-square step/large amplitude step activities        Problem List Patient Active Problem List   Diagnosis Date Noted  . Sinus disease 07/28/2014  . MDD (major depressive disorder), recurrent episode, severe 02/10/2014  . Nonspecific abnormal electrocardiogram (ECG) (EKG) 02/07/2014  . Non-compliant behavior 02/03/2014  . Other abnormal glucose 10/02/2013  . Morton's neuroma of left foot 08/07/2013  . Stress fracture of left foot 07/17/2013  . 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 Major depressive disorder, recurrent episode, severe 03/13/2012  . Hyperglycemia 01/26/2012  . Hypogonadism male 01/24/2012  . Syncope and collapse 01/20/2012  .  OSA (obstructive sleep apnea) 01/17/2011  . CHEST TIGHTNESS 02/18/2008   Delrae Sawyers, PT,DPT,NCS 12/29/2014 10:56 AM Phone [phone] FAX [phone]         Harriet Masson 12/29/2014, 10:55 AM  [facility] [address] [place], [place], [address] Phone: [phone]   Fax:  [phone]

## 2014-12-29 NOTE — Therapy (Signed)
[facility] [address] [place], [place], [address] Phone: [phone]   Fax:  [phone]  Occupational Therapy Treatment  Patient Details  Name: [name] [name] MRN: [id number] Date of Birth: 1951/05/05 Referring Provider:  Olga Millers, MD  Encounter Date: 12/29/2014      OT End of Session - 12/29/14 0805    Visit Number 7   Number of Visits 17   Authorization Type BCBS 75 visit limit combined, no auth   OT Start Time 0754   OT Stop Time 0833   OT Time Calculation (min) 39 min   Activity Tolerance Patient tolerated treatment well   Behavior During Therapy [facility] for tasks assessed/performed      Past Medical History  Diagnosis Date  . Headache(784.0)   . Cardiac murmur     as a child  . Streptococcal meningitis     as an infant  . Depression   .  OSA (obstructive sleep apnea) 01/17/2011    npsg 2012:  AHI 67/hr. Auto titration 2012:  Optimal pressure 12cm.   Marland Kitchen HEADACHES, HX OF 02/18/2008    Qualifier: Diagnosis of  By: Danny Lawless CMA, [place]    . APPENDECTOMY, HX OF 02/18/2008    Qualifier: Diagnosis of  By: Danny Lawless CMA, [place]    . Parkinson disease 11/2014    Past Surgical History  Procedure Laterality Date  . Nasal sinus surgery      x 4 as a child  . Vasectomy    . Appendectomy  1967    There were no vitals filed for this visit.  Visit Diagnosis:  Bradykinesia  Rigidity  Lack of coordination  Abnormal posture  Decreased functional mobility and endurance      Subjective Assessment - 12/29/14 0803    Symptoms Has done PWR! moves in prone once or twice at home   Currently in Pain? No/denies                    OT Treatments/Exercises (OP) - 12/29/14 0001    ADLs   Overall ADLs Reviewed/continued instruction of ways to prevent future complications with big movements with ADLs, awareness, and exercise.  Pt verbalized understanding.    ADL Comments began checking STGs  and discussing progress.     Functional Reaching Activities   High Level In sitting, reaching overhead and behind to place large pegs in vertical pegboard with trunk rotation, wt. shift and PWR! reach/big movements with min v.c. initially and set-up for big movments.                OT Education - 12/29/14 8768    Education Details Reviewed PWR! Moves in [place] and instructed pt in PWR! Moves in [place] (basic 4)   Person(s) Educated Patient   Methods Explanation;Verbal cues;Handout;Demonstration   Comprehension Verbalized understanding;Returned demonstration;Verbal cues required          OT Short Term Goals - 12/29/14 0806    OT SHORT TERM GOAL #1   Title Pt will be independent with HEP--due 12/30/14   Time 4   Period Weeks   Status Achieved  12/29/14   OT SHORT TERM GOAL #2   Title Pt will verbalize understanding of PD symptoms and ways to prevent future complications.--due 12/30/14   Time 4   Period Weeks   Status Achieved  12/29/14   OT SHORT TERM GOAL #3   Title Pt will verbalize understanding of PD-specific appropriate community resources.--due 12/30/14  Time 4   Period Weeks   Status New   OT SHORT TERM GOAL #4   Title Pt will complete dressing fasteners mod I using strategies/AE prn.--due 12/30/14   Time 4   Period Weeks   Status New   OT SHORT TERM GOAL #5   Title Pt will improve coordination/functional reaching as shown by improving score on box and blocks test by at least 10 with RUE.--due 12/30/14   Baseline 37   Time 4   Period Weeks   Status New           OT Long Term Goals - 12/01/14 8416    OT LONG TERM GOAL #1   Title Pt will verbalize understanding of AE/strategies for ADLs/IADLs prn.--due 01/29/15   Time 8   Period Weeks   Status New   OT LONG TERM GOAL #2   Title Pt will improve coordination for ADLs as shown by improving time on 9-hole peg test by at least 5 bilaterally.--due 01/29/15   Baseline R-32.59sec, L-35.66sec   Time 8   Period  Weeks   Status New   OT LONG TERM GOAL #3   Title Pt will improve coordination/functional reaching as shown by improving score on box and blocks test by at least 5 with LUE.--due 01/29/15   Baseline 45   Time 8   Period Weeks   Status New   OT LONG TERM GOAL #4   Title Pt will improve coordination/functional reaching as shown by improving score on box and blocks test by at least 10 with RUE.--due 01/29/15   Baseline 37   Time 8   Period Weeks   Status New   OT LONG TERM GOAL #5   Title Pt will improve dressing ability as shown by improving time on PPT#4 by at least 5sec.--due 01/29/15   Baseline 24.53sec   Time 8   Period Weeks   Status New   Long Term Additional Goals   Additional Long Term Goals Yes   OT LONG TERM GOAL #6   Title Pt will improve bradykinesia as shown by improving time on PPT#2 by at least 5sec.--due 01/29/15   Baseline 15.35   Time 8   Period Weeks   Status New               Plan - 12/29/14 6063    Clinical Impression Statement STG #1 met.  Pt responds well to min cues for bigger movements.   Plan check STGs, coordination, big movements for ADLs   OT Home Exercise Plan Has been issued:  PWR! moves seated, coordination/ PWR! hands HEP, PWR! moves (basic 4) in prone and quadraped (12/29/14)    Consulted and Agree with Plan of Care Patient        Problem List Patient Active Problem List   Diagnosis Date Noted  . Sinus disease 07/28/2014  . MDD (major depressive disorder), recurrent episode, severe 02/10/2014  . Nonspecific abnormal electrocardiogram (ECG) (EKG) 02/07/2014  . Non-compliant behavior 02/03/2014  . Other abnormal glucose 10/02/2013  . Morton's neuroma of left foot 08/07/2013  . Stress fracture of left foot 07/17/2013  . 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 Major depressive disorder, recurrent episode, severe 03/13/2012  . Hyperglycemia 01/26/2012  . Hypogonadism male 01/24/2012  . Syncope and  collapse 01/20/2012  .  OSA (obstructive sleep apnea) 01/17/2011  . CHEST TIGHTNESS 02/18/2008    Kalii Chesmore 12/29/2014, 2:03 PM  [facility] [address] [place], [place], [address] Phone: [phone]   Fax:  [phone]    , OTR/L 12/29/2014 2:03 PM    

## 2014-12-29 NOTE — Therapy (Signed)
[facility] [address] [place], [place], [address] Phone: [phone]   Fax:  [phone]  Speech Language Pathology Treatment  Patient Details  Name: [name] [name] MRN: [id number] Date of Birth: 08/28/1951 Referring Provider:  Olga Millers, MD  Encounter Date: 12/29/2014      End of Session - 12/29/14 0927    Visit Number 8   Number of Visits 16   Date for SLP Re-Evaluation 01/29/15   SLP Start Time 0848   SLP Stop Time  0930   SLP Time Calculation (min) 42 min   Activity Tolerance Patient tolerated treatment well      Past Medical History  Diagnosis Date  . Headache(784.0)   . Cardiac murmur     as a child  . Streptococcal meningitis     as an infant  . Depression   .  OSA (obstructive sleep apnea) 01/17/2011    npsg 2012:  AHI 67/hr. Auto titration 2012:  Optimal pressure 12cm.   Marland Kitchen HEADACHES, HX OF 02/18/2008    Qualifier: Diagnosis of  By: Danny Lawless CMA, [place]    . APPENDECTOMY, HX OF 02/18/2008    Qualifier: Diagnosis of  By: Danny Lawless CMA, [place]    . Parkinson disease 11/2014    Past Surgical History  Procedure Laterality Date  . Nasal sinus surgery      x 4 as a child  . Vasectomy    . Appendectomy  1967    There were no vitals filed for this visit.  Visit Diagnosis: Hypokinetic Parkinsonian dysphonia      Subjective Assessment - 12/29/14 0853    Symptoms Pt reports he is tired, came from work this morning.   Currently in Pain? No/denies               ADULT SLP TREATMENT - 12/29/14 0853    General Information   Behavior/Cognition Alert;Cooperative;Pleasant mood  flat affect   Treatment Provided   Treatment provided Cognitive-Linquistic   Pain Assessment   Pain Assessment No/denies pain   Cognitive-Linquistic Treatment   Treatment focused on Dysarthria   Skilled Treatment Pt told SLP he continues to complete exercises "at least once/day". SLP again stressed the  importance of x2/day exercises and explained why. Sentence respones with  average 71dB and cue x1 for louder speech - pt aware he was softer in volume. Mod complex language with loud voice (average 70dB) 95% success for 5 minutes. Loud /a/ average 85dB   Assessment / Recommendations / Plan   Plan Continue with current plan of care   Progression Toward Goals   Progression toward goals Progressing toward goals          SLP Education - 12/29/14 0927    Education provided Yes   Education Details HEP needs to be done x2/day   Person(s) Educated Patient   Methods Explanation;Verbal cues          SLP Short Term Goals - 12/29/14 0929    SLP SHORT TERM GOAL #1   Title Pt will demonstrate abdominal breathing in structured simple conversation for 10 minutes with occassional minimal cues.12/29/14   Time 1   Period Weeks   Status On-going   SLP SHORT TERM GOAL #2   Title Pt will demonstrate average of 84dB for sustained /a/ over 3 sessions with rare minimal assistance (12/29/14)   Time 1   Period Weeks   Status On-going   SLP SHORT TERM GOAL #3   Title Pt  will average 70dB in simple structured speech tasks over 20 minutes with occassional minimal assistance (12/29/14)   Status Achieved          SLP Long Term Goals - 12/29/14 0931    SLP LONG TERM GOAL #1   Title Demonstrate abdominal breathing for mildly complex conversation for 15 minutes with occassional minimal cues (01/29/15)   Time 5   Period Weeks   Status On-going   SLP LONG TERM GOAL #2   Title Pt will maintain an average of 70dBfor mildly complex conversation over 15 minutes with rare minimal assistance (01/29/15)   Time 5   Period Weeks   Status On-going   SLP LONG TERM GOAL #3   Title Pt will converse audibly in noisy environment for 15 minutes with rare minimal assistance (01/29/15)   Time 5   Period Weeks   Status On-going          Plan - 12/29/14 0370    Clinical Impression Statement Loud /a/ with "Hey!" 60% of  the time was successful in making WNL vocal quality. Skilled ST necessary to carryover louder speech to communicative situations outside th etherapy room.   Speech Therapy Frequency 2x / week   Duration 4 weeks   Treatment/Interventions Compensatory strategies;Functional tasks;Patient/family education;SLP instruction and feedback;Internal/external aids   Potential to Achieve Goals Fair   Potential Considerations Severity of impairments        Problem List Patient Active Problem List   Diagnosis Date Noted  . Sinus disease 07/28/2014  . MDD (major depressive disorder), recurrent episode, severe 02/10/2014  . Nonspecific abnormal electrocardiogram (ECG) (EKG) 02/07/2014  . Non-compliant behavior 02/03/2014  . Other abnormal glucose 10/02/2013  . Morton's neuroma of left foot 08/07/2013  . Stress fracture of left foot 07/17/2013  . 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 Major depressive disorder, recurrent episode, severe 03/13/2012  . Hyperglycemia 01/26/2012  . Hypogonadism male 01/24/2012  . Syncope and collapse 01/20/2012  .  OSA (obstructive sleep apnea) 01/17/2011  . CHEST TIGHTNESS 02/18/2008    SCHINKE,CARL,SLP 12/29/2014, 9:32 AM  [facility] [address] [place], [place], [address] Phone: [phone]   Fax:  [phone]

## 2014-12-31 ENCOUNTER — Ambulatory Visit: Payer: BLUE CROSS/BLUE SHIELD | Admitting: Occupational Therapy

## 2014-12-31 ENCOUNTER — Ambulatory Visit: Payer: BLUE CROSS/BLUE SHIELD

## 2014-12-31 ENCOUNTER — Ambulatory Visit: Payer: BLUE CROSS/BLUE SHIELD | Admitting: Physical Therapy

## 2014-12-31 DIAGNOSIS — R269 Unspecified abnormalities of gait and mobility: Secondary | ICD-10-CM

## 2014-12-31 DIAGNOSIS — R490 Dysphonia: Secondary | ICD-10-CM

## 2014-12-31 DIAGNOSIS — Z7409 Other reduced mobility: Secondary | ICD-10-CM

## 2014-12-31 DIAGNOSIS — G20 Parkinson's disease: Secondary | ICD-10-CM | POA: Diagnosis not present

## 2014-12-31 DIAGNOSIS — R258 Other abnormal involuntary movements: Secondary | ICD-10-CM

## 2014-12-31 DIAGNOSIS — R279 Unspecified lack of coordination: Secondary | ICD-10-CM

## 2014-12-31 DIAGNOSIS — R293 Abnormal posture: Secondary | ICD-10-CM

## 2014-12-31 DIAGNOSIS — R29898 Other symptoms and signs involving the musculoskeletal system: Secondary | ICD-10-CM

## 2014-12-31 NOTE — Patient Instructions (Signed)
Continue facial exercises and loud /a/ x2/day at home.

## 2014-12-31 NOTE — Therapy (Signed)
[facility] [address] [place], [place], [address] Phone: [phone]   Fax:  [phone]  Occupational Therapy Treatment  Patient Details  Name: [name] [name] MRN: [id number] Date of Birth: 24-Aug-1951 Referring Provider:  Olga Millers, MD  Encounter Date: 12/31/2014      OT End of Session - 12/31/14 0852    Visit Number 8   Number of Visits 17   Date for OT Re-Evaluation 01/29/15   Authorization Type BCBS 75 visit limit combined, no auth   OT Start Time [phone]   OT Stop Time 0930   OT Time Calculation (min) 43 min   Activity Tolerance Patient tolerated treatment well   Behavior During Therapy [facility] for tasks assessed/performed      Past Medical History  Diagnosis Date  . Headache(784.0)   . Cardiac murmur     as a child  . Streptococcal meningitis     as an infant  . Depression   .  OSA (obstructive sleep apnea) 01/17/2011    npsg 2012:  AHI 67/hr. Auto titration 2012:  Optimal pressure 12cm.   Marland Kitchen HEADACHES, HX OF 02/18/2008    Qualifier: Diagnosis of  By: Danny Lawless CMA, [place]    . APPENDECTOMY, HX OF 02/18/2008    Qualifier: Diagnosis of  By: Danny Lawless CMA, [place]    . Parkinson disease 11/2014    Past Surgical History  Procedure Laterality Date  . Nasal sinus surgery      x 4 as a child  . Vasectomy    . Appendectomy  1967    There were no vitals filed for this visit.  Visit Diagnosis:  Rigidity  Abnormal posture  Decreased functional mobility and endurance  Lack of coordination  Bradykinesia      Subjective Assessment - 12/31/14 0851    Pertinent History Diagnosed with Parkinson's approx 1 month ago.   Currently in Pain? No/denies      Treatment: Therapist checked progress towards remaining goals. Reviewed PWR! quadraped exercises 10 reps each, min v.c. Functional reaching activity with bilateral UE's to place graded clothespins on vertical antennae, min v.c. Pt was  instructed in use of buttonhook, he returned demonstration. Armbike x 6 mins, for conditioning, min v.c. For speed , pt able to maintain 33-40 RPMs.                         OT Short Term Goals - 12/31/14 0852    OT SHORT TERM GOAL #1   Title Pt will be independent with HEP--due 12/30/14   Time 4   Period Weeks   Status Achieved  12/29/14   OT SHORT TERM GOAL #2   Title Pt will verbalize understanding of PD symptoms and ways to prevent future complications.--due 12/30/14   Time 4   Period Weeks   Status Achieved  12/29/14   OT SHORT TERM GOAL #3   Title Pt will verbalize understanding of PD-specific appropriate community resources.--due 12/30/14   Baseline verbalizes understanding of POP and community fitness opportunities   Time 4   Period Weeks   Status Achieved   OT SHORT TERM GOAL #4   Title Pt will complete dressing fasteners mod I using strategies/AE prn.--due 12/30/14   Baseline Pt demonstrates understanding of button hook use, pt continues to report difficulty tying apron/shoes   Time 4   Period Weeks   Status Partially Met   OT SHORT TERM GOAL #5   Title  Pt will improve coordination/functional reaching as shown by improving score on box and blocks test by at least 10 with RUE.--due 12/30/14   Baseline 47 blocks on 12/31/14   Time 4   Period Weeks   Status Achieved           OT Long Term Goals - 12/01/14 0903    OT LONG TERM GOAL #1   Title Pt will verbalize understanding of AE/strategies for ADLs/IADLs prn.--due 01/29/15   Time 8   Period Weeks   Status New   OT LONG TERM GOAL #2   Title Pt will improve coordination for ADLs as shown by improving time on 9-hole peg test by at least 5 bilaterally.--due 01/29/15   Baseline R-32.59sec, L-35.66sec   Time 8   Period Weeks   Status New   OT LONG TERM GOAL #3   Title Pt will improve coordination/functional reaching as shown by improving score on box and blocks test by at least 5 with LUE.--due 01/29/15    Baseline 45   Time 8   Period Weeks   Status New   OT LONG TERM GOAL #4   Title Pt will improve coordination/functional reaching as shown by improving score on box and blocks test by at least 10 with RUE.--due 01/29/15   Baseline 37   Time 8   Period Weeks   Status New   OT LONG TERM GOAL #5   Title Pt will improve dressing ability as shown by improving time on PPT#4 by at least 5sec.--due 01/29/15   Baseline 24.53sec   Time 8   Period Weeks   Status New   Long Term Additional Goals   Additional Long Term Goals Yes   OT LONG TERM GOAL #6   Title Pt will improve bradykinesia as shown by improving time on PPT#2 by at least 5sec.--due 01/29/15   Baseline 15.35   Time 8   Period Weeks   Status New               Plan - 12/31/14 0930    Clinical Impression Statement Pt demonstrates excellent progress achieving 4/5 STG's   Pt will benefit from skilled therapeutic intervention in order to improve on the following deficits (Retired) Decreased coordination;Decreased endurance;Impaired tone;Decreased activity tolerance;Decreased balance;Decreased knowledge of use of DME;Impaired UE functional use;Decreased cognition;Decreased mobility;Impaired perceived functional ability   Rehab Potential Good   OT Treatment/Interventions Self-care/ADL training;Moist Heat;DME and/or AE instruction;Splinting;Patient/family education;Balance training;Therapeutic exercises;Therapeutic exercise;Therapeutic activities;Cognitive remediation/compensation;Passive range of motion;Functional Mobility Training;Neuromuscular education;Cryotherapy;Electrical Stimulation;Energy conservation;Manual Therapy   Plan coordiantion, big movements for ADLs   OT Home Exercise Plan Has been issued:  PWR! moves seated, coordination/ PWR! hands HEP, PWR! moves (basic 4) in prone and quadraped (12/29/14)    Consulted and Agree with Plan of Care Patient        Problem List Patient Active Problem List   Diagnosis Date Noted   . Sinus disease 07/28/2014  . MDD (major depressive disorder), recurrent episode, severe 02/10/2014  . Nonspecific abnormal electrocardiogram (ECG) (EKG) 02/07/2014  . Non-compliant behavior 02/03/2014  . Other abnormal glucose 10/02/2013  . Morton's neuroma of left foot 08/07/2013  . Stress fracture of left foot 07/17/2013  . 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 Major depressive disorder, recurrent episode, severe 03/13/2012  . Hyperglycemia 01/26/2012  . Hypogonadism male 01/24/2012  . Syncope and collapse 01/20/2012  .  OSA (obstructive sleep apnea) 01/17/2011  . CHEST TIGHTNESS 02/18/2008    RINE,KATHRYN 12/31/2014, 9:32  AM Theone Murdoch, OTR/L [phone] Phone: [phone] 9:32 AM 12/31/2014 [facility] [address] [place], [place], [address] Phone: [phone]   Fax:  [phone]

## 2014-12-31 NOTE — Therapy (Signed)
[facility] [address] [place], [place], [address] Phone: [phone]   Fax:  [phone]  Speech Language Pathology Treatment  Patient Details  Name: [name] [name] MRN: [id number] Date of Birth: 1951-03-07 Referring Provider:  Olga Millers, MD  Encounter Date: 12/31/2014      End of Session - 12/31/14 1018    Visit Number 9   Number of Visits 16   Date for SLP Re-Evaluation 01/29/15   SLP Start Time 0935   SLP Stop Time  1016   SLP Time Calculation (min) 41 min   Activity Tolerance Patient tolerated treatment well      Past Medical History  Diagnosis Date  . Headache(784.0)   . Cardiac murmur     as a child  . Streptococcal meningitis     as an infant  . Depression   .  OSA (obstructive sleep apnea) 01/17/2011    npsg 2012:  AHI 67/hr. Auto titration 2012:  Optimal pressure 12cm.   Marland Kitchen HEADACHES, HX OF 02/18/2008    Qualifier: Diagnosis of  By: Danny Lawless CMA, [place]    . APPENDECTOMY, HX OF 02/18/2008    Qualifier: Diagnosis of  By: Danny Lawless CMA, [place]    . Parkinson disease 11/2014    Past Surgical History  Procedure Laterality Date  . Nasal sinus surgery      x 4 as a child  . Vasectomy    . Appendectomy  1967    There were no vitals filed for this visit.  Visit Diagnosis: Hypokinetic Parkinsonian dysphonia      Subjective Assessment - 12/31/14 0937    Symptoms "(loud "ah"s are) Shaky and tired sounding. Harder to do things when you're tired."   Currently in Pain? No/denies               ADULT SLP TREATMENT - 12/31/14 0938    General Information   Behavior/Cognition Alert;Cooperative;Pleasant mood   Treatment Provided   Treatment provided Cognitive-Linquistic   Pain Assessment   Pain Assessment No/denies pain   Cognitive-Linquistic Treatment   Treatment focused on Dysarthria   Skilled Treatment Loud /a/ with average 85dB and rare min A for loudness. No "Hey!" required.  Multi sentence tasks completed with average 71 dB (sequence pictures). Walking around in clinic looking at pictures to sequence, pt with what appeared to be WNL volume. In walking outside around building, pt with adequate/appeared WNL loudness as well.   Assessment / Recommendations / Plan   Plan Continue with current plan of care   Progression Toward Goals   Progression toward goals Progressing toward goals            SLP Short Term Goals - 12/31/14 1019    SLP SHORT TERM GOAL #1   Title Pt will demonstrate abdominal breathing in structured simple conversation for 10 minutes with occassional minimal cues.12/29/14   Time 1   Period Weeks   Status On-going   SLP SHORT TERM GOAL #2   Title Pt will demonstrate average of 84dB for sustained /a/ over 3 sessions with rare minimal assistance (12/29/14)   Time 1   Period Weeks   Status On-going   SLP SHORT TERM GOAL #3   Title Pt will average 70dB in simple structured speech tasks over 20 minutes with occassional minimal assistance (12/29/14)   Status Achieved          SLP Long Term Goals - 12/31/14 1020    SLP LONG TERM GOAL #  1   Title Demonstrate abdominal breathing for mildly complex conversation for 15 minutes with occassional minimal cues (01/29/15)   Time 5   Period Weeks   Status On-going   SLP LONG TERM GOAL #2   Title Pt will maintain an average of 70dBfor modcomplex conversation over 15 minutes with rare minimal assistance (01/29/15)   Time 5   Period Weeks   Status Revised   SLP LONG TERM GOAL #3   Title Pt will converse audibly in noisy environment for 15 minutes with rare minimal assistance (01/29/15)   Time 5   Period Weeks   Status On-going          Plan - 12/31/14 1018    Clinical Impression Statement Pt did not need "Hey!" prior to loud /a/ today. Pt's ability for WNL speech outside therapy room is improving. HEP for facial exercises will need to be assessed next visit.   Speech Therapy Frequency 2x / week    Duration 4 weeks   Treatment/Interventions Compensatory strategies;Functional tasks;Patient/family education;SLP instruction and feedback;Internal/external aids   Potential to Achieve Goals Good   Potential Considerations Severity of impairments        Problem List Patient Active Problem List   Diagnosis Date Noted  . Sinus disease 07/28/2014  . MDD (major depressive disorder), recurrent episode, severe 02/10/2014  . Nonspecific abnormal electrocardiogram (ECG) (EKG) 02/07/2014  . Non-compliant behavior 02/03/2014  . Other abnormal glucose 10/02/2013  . Morton's neuroma of left foot 08/07/2013  . Stress fracture of left foot 07/17/2013  . 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 Major depressive disorder, recurrent episode, severe 03/13/2012  . Hyperglycemia 01/26/2012  . Hypogonadism male 01/24/2012  . Syncope and collapse 01/20/2012  .  OSA (obstructive sleep apnea) 01/17/2011  . CHEST TIGHTNESS 02/18/2008    Garald Balding, SLP 12/31/2014, 10:22 AM  [facility] [address] [place], [place], [address] Phone: [phone]   Fax:  [phone]

## 2014-12-31 NOTE — Therapy (Signed)
[facility] [address] [place], [place], [address] Phone: [phone]   Fax:  [phone]  Physical Therapy Treatment  Patient Details  Name: [name] [name] MRN: [id number] Date of Birth: Oct 20, 1950 Referring Provider:  Olga Millers, MD  Encounter Date: 12/31/2014      PT End of Session - 12/31/14 1756    Visit Number 9   Number of Visits 17   Date for PT Re-Evaluation 01/30/15   Authorization Type BCBS 75 visit limit combined   PT Start Time 1024   PT Stop Time 1102   PT Time Calculation (min) 38 min   Activity Tolerance Patient tolerated treatment well   Behavior During Therapy [facility] for tasks assessed/performed      Past Medical History  Diagnosis Date  . Headache(784.0)   . Cardiac murmur     as a child  . Streptococcal meningitis     as an infant  . Depression   .  OSA (obstructive sleep apnea) 01/17/2011    npsg 2012:  AHI 67/hr. Auto titration 2012:  Optimal pressure 12cm.   Marland Kitchen HEADACHES, HX OF 02/18/2008    Qualifier: Diagnosis of  By: Danny Lawless CMA, [place]    . APPENDECTOMY, HX OF 02/18/2008    Qualifier: Diagnosis of  By: Danny Lawless CMA, [place]    . Parkinson disease 11/2014    Past Surgical History  Procedure Laterality Date  . Nasal sinus surgery      x 4 as a child  . Vasectomy    . Appendectomy  1967    There were no vitals filed for this visit.  Visit Diagnosis:  Abnormality of gait      Subjective Assessment - 12/31/14 1025    Symptoms No falls, no pain; feels like he has been shuffling more in the past 12 hours than usual.   Currently in Pain? No/denies                       [facility] Adult PT Treatment/Exercise - 12/31/14 1038    Ambulation/Gait   Ambulation/Gait Yes   Ambulation/Gait Assistance 7: Independent   Ambulation Distance (Feet) 600 Feet  then 200 ft   Assistive device None   Gait Pattern Decreased step length - right;Decreased step length -  left;Decreased dorsiflexion - left;Shuffle;Trunk flexed;Narrow base of support;Poor foot clearance - left   Ambulation Surface Level;Indoor   Gait Comments Gait activities with start/stops with cues for weightshifting to start with large steps; 4-square step activity for improved change of directions and regaining balance with quick start/stops in varied directions   Lumbar Exercises: Aerobic   Tread Mill 1.4>1.8 mph with bilateral UE support with cues for upright posture, increased heelstrike, increased foot clearance, with use of therapy ball as target for 1st minute at 0.8>1 mph     Verbal and visual cues provided during overground and treadmill gait activities for increased step length, increased foot clearance and increased heelstrike with gait.  Discussed stopping if pt feels he is shuffling, in order to "reset" with larger amplitude step length.  Forward/backward walking activities, then figure of 8 walking activities with changes of directions, then forward gait with large amplitude stepping over hula hoops to increase step length and height.           PT Education - 12/31/14 1753    Education provided Yes   Education Details Provided information on community PD resources-PWR! Exercise classes (pt will likelynot be able  to attend due to work schedule); [employer] challenge; [facility]   Person(s) Educated Patient   Methods Explanation;Handout   Comprehension Verbalized understanding          PT Short Term Goals - 12/31/14 1025    PT SHORT TERM GOAL #1   Title Pt will be independent with HEP for improved transfers, balance, and gait. (Target 12/30/14)   Status Partially Met   PT SHORT TERM GOAL #2   Title Pt will improve 5x sit<>stand to less than or equal to 11.5 seconds for improved transfer efficiency and safety.   Status Achieved   PT SHORT TERM GOAL #3   Title Pt will improve Functional Gait Assessment score to at least 18/30 for decreased fall risk.   Status  Achieved   PT SHORT TERM GOAL #4   Title Pt will improve TUG score to less than or equal to 13.5 seconds for decreased fall risk.   Status Achieved   PT SHORT TERM GOAL #5   Title Pt will verbalize understanding of local Parkinson's-disease related resources.   Status Achieved           PT Long Term Goals - 12/29/14 1053    PT LONG TERM GOAL #1   Title Pt will verbalize understanding of fall prevention techniques within the home environment. (Target 01/30/15)   Time 8   Period Weeks   Status New   PT LONG TERM GOAL #2   Title Pt will improve gait velocity to at least 2.62 ft/sec for improved gait efficiency and safety.   Time 8   Period Weeks   Status New   PT LONG TERM GOAL #3   Title Pt will improve Functional Gait Assessment to at least 22/30 for decreased fall risk.   Time 8   Period Weeks   Status Achieved  24/30 on 12/29/14   PT LONG TERM GOAL #4   Title Pt will improve TUG cognitive score to less than or equal to 15 seconds for decreased fall risk.   Time 8   Period Weeks   Status New   PT LONG TERM GOAL #5   Title Pt will verbalize plans for continued community fitness upon D/C from PT.   Time 8   Period Weeks   Status New               Plan - 12/31/14 1757    Clinical Impression Statement Pt met STG for community Parkinson's resources.  Pt will continue to benefit from further skilled PT to address large amplitude functional movement patterns.   Pt will benefit from skilled therapeutic intervention in order to improve on the following deficits Abnormal gait;Decreased activity tolerance;Decreased balance;Decreased endurance;Decreased mobility;Difficulty walking;Decreased strength;Improper body mechanics;Postural dysfunction   Rehab Potential Good   PT Frequency 2x / week   PT Duration 8 weeks  wk 4 of 8   PT Treatment/Interventions ADLs/Self Care Home Management;Gait training;Stair training;Functional mobility training;Therapeutic  activities;Neuromuscular re-education;Balance training;Therapeutic exercise;Patient/family education   PT Next Visit Plan gait, large amplitude standing movements and balance activities; treadmill gait, 4-square step/large amplitude step activities   Consulted and Agree with Plan of Care Patient        Problem List Patient Active Problem List   Diagnosis Date Noted  . Sinus disease 07/28/2014  . MDD (major depressive disorder), recurrent episode, severe 02/10/2014  . Nonspecific abnormal electrocardiogram (ECG) (EKG) 02/07/2014  . Non-compliant behavior 02/03/2014  . Other abnormal glucose 10/02/2013  . Morton's neuroma of  left foot 08/07/2013  . Stress fracture of left foot 07/17/2013  . 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 Major depressive disorder, recurrent episode, severe 03/13/2012  . Hyperglycemia 01/26/2012  . Hypogonadism male 01/24/2012  . Syncope and collapse 01/20/2012  .  OSA (obstructive sleep apnea) 01/17/2011  . CHEST TIGHTNESS 02/18/2008    Sharonne Ricketts W. 12/31/2014, 6:00 PM  Regnald Bowens [place], PT 12/31/2014 6:01 PM Phone: [phone] Fax: [facility] [address] [place], [place], [address] Phone: [phone]   Fax:  [phone]

## 2015-01-05 ENCOUNTER — Encounter: Payer: Self-pay | Admitting: Occupational Therapy

## 2015-01-06 ENCOUNTER — Ambulatory Visit: Payer: Federal, State, Local not specified - PPO | Attending: Neurology | Admitting: Occupational Therapy

## 2015-01-06 ENCOUNTER — Encounter: Payer: Self-pay | Admitting: Physical Therapy

## 2015-01-06 ENCOUNTER — Ambulatory Visit: Payer: Federal, State, Local not specified - PPO

## 2015-01-06 ENCOUNTER — Ambulatory Visit: Payer: Federal, State, Local not specified - PPO | Admitting: Physical Therapy

## 2015-01-06 DIAGNOSIS — R258 Other abnormal involuntary movements: Secondary | ICD-10-CM

## 2015-01-06 DIAGNOSIS — G20 Parkinson's disease: Secondary | ICD-10-CM | POA: Diagnosis present

## 2015-01-06 DIAGNOSIS — R29898 Other symptoms and signs involving the musculoskeletal system: Secondary | ICD-10-CM

## 2015-01-06 DIAGNOSIS — R269 Unspecified abnormalities of gait and mobility: Secondary | ICD-10-CM

## 2015-01-06 DIAGNOSIS — R490 Dysphonia: Secondary | ICD-10-CM

## 2015-01-06 DIAGNOSIS — Z7409 Other reduced mobility: Secondary | ICD-10-CM

## 2015-01-06 DIAGNOSIS — R279 Unspecified lack of coordination: Secondary | ICD-10-CM

## 2015-01-06 DIAGNOSIS — R293 Abnormal posture: Secondary | ICD-10-CM

## 2015-01-06 NOTE — Therapy (Signed)
[facility] [address] [place], [place], [address] Phone: [phone]   Fax:  [phone]  Speech Language Pathology Treatment  Patient Details  Name: [name] [name] MRN: [id number] Date of Birth: [birth year] Referring Provider:  Olga Millers, MD  Encounter Date: 01/06/2015      End of Session - 01/06/15 0934    Visit Number 10   Number of Visits 16   Date for SLP Re-Evaluation 01/29/15   SLP Start Time 0848   SLP Stop Time  0932   SLP Time Calculation (min) 44 min   Activity Tolerance Patient tolerated treatment well      Past Medical History  Diagnosis Date  . Headache(784.0)   . Cardiac murmur     as a child  . Streptococcal meningitis     as an infant  . Depression   .  OSA (obstructive sleep apnea) 01/17/2011    npsg 2012:  AHI 67/hr. Auto titration 2012:  Optimal pressure 12cm.   Marland Kitchen HEADACHES, HX OF 02/18/2008    Qualifier: Diagnosis of  By: Danny Lawless CMA, [place]    . APPENDECTOMY, HX OF 02/18/2008    Qualifier: Diagnosis of  By: Danny Lawless CMA, [place]    . Parkinson disease 11/2014    Past Surgical History  Procedure Laterality Date  . Nasal sinus surgery      x 4 as a child  . Vasectomy    . Appendectomy  1967    There were no vitals filed for this visit.  Visit Diagnosis: Hypokinetic Parkinsonian dysphonia      Subjective Assessment - 01/06/15 0850    Subjective "I usually do (the loud "ah"s) in the car." Pt cont to complete loud /a/ once per day, and is not performing facial exercises.               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 Explained to pt (again) why  loud /a/ x5/day necessary. Read unscrambled sentences with average 73dB. Initial min cues needed walking around clinic to remain loud. Pt with simple conversation with average 69dB and rare min A. Limited facial expression noted today.   Assessment / Recommendations / Plan   Plan Continue with current plan of care   Progression Toward Goals   Progression toward goals Progressing toward goals          SLP Education - 01/06/15 0933    Education Details Need to complete loud /a/ twice a day, rationale for doing so   Person(s) Educated Patient   Methods Explanation   Comprehension Verbalized understanding          SLP Short Term Goals - 01/06/15 1639    SLP SHORT TERM GOAL #1   Title Pt will demonstrate abdominal breathing in structured simple conversation for 10 minutes with occassional minimal cues.12/29/14   Time 1   Period Weeks   Status Not Met   SLP SHORT TERM GOAL #2   Title Pt will demonstrate average of 84dB for sustained /a/ over 3 sessions with rare minimal assistance (12/29/14)   Time 1   Period Weeks   Status Not Met   SLP SHORT TERM GOAL #3   Title Pt will  average 70dB in simple structured speech tasks over 20 minutes with occassional minimal assistance (12/29/14)   Status Achieved          SLP Long Term Goals - 01/06/15 1639    SLP LONG TERM GOAL #1   Title Demonstrate abdominal breathing for mildly complex conversation for 15 minutes with occassional minimal cues (01/29/15)   Time 4   Period Weeks   Status On-going   SLP LONG TERM GOAL #2   Title Pt will maintain an average of 70dBfor modcomplex conversation over 15 minutes with rare minimal assistance (01/29/15)   Time 4   Period Weeks   Status Revised   SLP LONG TERM GOAL #3   Title Pt will converse audibly in noisy environment for 15 minutes with rare minimal assistance (01/29/15)   Time 4   Period Weeks   Status On-going          Plan - 01/06/15 1637    Clinical Impression Statement "Hey" needed  today with loud /a/ due to incr'd vocal fry/rough vocalization. When pt utilized "hey", /a/ with WNL vocal quality.   Speech Therapy Frequency 2x / week   Duration --  3 weeks   Treatment/Interventions Compensatory strategies;Functional tasks;Patient/family education;SLP instruction and feedback;Internal/external aids   Potential to Achieve Goals Good   Potential Considerations Severity of impairments        Problem List Patient Active Problem List   Diagnosis Date Noted  . Sinus disease 07/28/2014  . MDD (major depressive disorder), recurrent episode, severe 02/10/2014  . Nonspecific abnormal electrocardiogram (ECG) (EKG) 02/07/2014  . Non-compliant behavior 02/03/2014  . Other abnormal glucose 10/02/2013  . Morton's neuroma of left foot 08/07/2013  . Stress fracture of left foot 07/17/2013  . 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 Major depressive disorder, recurrent episode, severe 03/13/2012  . Hyperglycemia 01/26/2012  . Hypogonadism male 01/24/2012  . Syncope and collapse 01/20/2012  .  OSA (obstructive sleep apnea) 01/17/2011  . CHEST TIGHTNESS 02/18/2008    [facility], SLP 01/06/2015, 4:40 PM  [facility] [address] [place], [place], [address] Phone: [phone]   Fax:  [phone]

## 2015-01-06 NOTE — Therapy (Signed)
[facility] [address] [place], [place], [address] Phone: [phone]   Fax:  [phone]  Physical Therapy Treatment  Patient Details  Name: [name] [name] MRN: [id number] Date of Birth: [birth year] Referring Provider:  Olga Millers, MD  Encounter Date: 01/06/2015      PT End of Session - 01/06/15 1311    Visit Number 10   Number of Visits 17   Date for PT Re-Evaluation 01/30/15   Authorization Type BCBS 75 visit limit combined   PT Start Time 0806   PT Stop Time 0846   PT Time Calculation (min) 40 min   Activity Tolerance Patient tolerated treatment well   Behavior During Therapy [facility] for tasks assessed/performed      Past Medical History  Diagnosis Date  . Headache(784.0)   . Cardiac murmur     as a child  . Streptococcal meningitis     as an infant  . Depression   .  OSA (obstructive sleep apnea) 01/17/2011    npsg 2012:  AHI 67/hr. Auto titration 2012:  Optimal pressure 12cm.   Marland Kitchen HEADACHES, HX OF 02/18/2008    Qualifier: Diagnosis of  By: Danny Lawless CMA, [place]    . APPENDECTOMY, HX OF 02/18/2008    Qualifier: Diagnosis of  By: Danny Lawless CMA, [place]    . Parkinson disease 11/2014    Past Surgical History  Procedure Laterality Date  . Nasal sinus surgery      x 4 as a child  . Vasectomy    . Appendectomy  1967    There were no vitals filed for this visit.  Visit Diagnosis:  Abnormality of gait      Subjective Assessment - 01/06/15 0812    Subjective No falls or changes.   Patient Stated Goals Pt's goal for therapy is to get stronger and move better.   Currently in Pain? No/denies                       [facility] Adult PT Treatment/Exercise - 01/06/15 1303    Ambulation/Gait   Ambulation/Gait Yes   Ambulation/Gait Assistance 7: Independent;5: Supervision;4: Min assist   Ambulation/Gait Assistance Details cues for heel strike, arm swing and intensity-PTA assisting with arm  swing using poles    Ambulation Distance (Feet) 800 Feet   Assistive device None   Gait Pattern Decreased step length - right;Decreased step length - left;Decreased dorsiflexion - left;Shuffle;Trunk flexed;Narrow base of support;Poor foot clearance - left   Ambulation Surface Level;Indoor   Lumbar Exercises: Aerobic   Stationary Bike Scifit level 2.0 all 4 extremities x 8 minutes with bouts of increased intensity   Tread Mill 1.4>1.8 mph with bilateral UE support with cues for upright posture, increased heelstrike, increased foot clearance x 8 minutes and again x 2 minutes with use of therapy ball as target for 1st minute at 0.8>1 mph                                            [facility]) - 01/06/15 1307    Step and Reach Forward Other reps (comment)  20-with UE support   Step and Reach Backward Other reps (comment)  20-with UE support   Step and Reach Sideways Other reps (comment)  20-with UE support   Rock and Reach Forward/Backward Other reps (comment)  20-with UE  support              PT Short Term Goals - 12/31/14 1025    PT SHORT TERM GOAL #1   Title Pt will be independent with HEP for improved transfers, balance, and gait. (Target 12/30/14)   Status Partially Met   PT SHORT TERM GOAL #2   Title Pt will improve 5x sit<>stand to less than or equal to 11.5 seconds for improved transfer efficiency and safety.   Status Achieved   PT SHORT TERM GOAL #3   Title Pt will improve Functional Gait Assessment score to at least 18/30 for decreased fall risk.   Status Achieved   PT SHORT TERM GOAL #4   Title Pt will improve TUG score to less than or equal to 13.5 seconds for decreased fall risk.   Status Achieved   PT SHORT TERM GOAL #5   Title Pt will verbalize understanding of local Parkinson's-disease related resources.   Status Achieved           PT Long Term Goals - 12/29/14 1053    PT LONG TERM GOAL #1   Title Pt will verbalize understanding of fall  prevention techniques within the home environment. (Target 01/30/15)   Time 8   Period Weeks   Status New   PT LONG TERM GOAL #2   Title Pt will improve gait velocity to at least 2.62 ft/sec for improved gait efficiency and safety.   Time 8   Period Weeks   Status New   PT LONG TERM GOAL #3   Title Pt will improve Functional Gait Assessment to at least 22/30 for decreased fall risk.   Time 8   Period Weeks   Status Achieved  24/30 on 12/29/14   PT LONG TERM GOAL #4   Title Pt will improve TUG cognitive score to less than or equal to 15 seconds for decreased fall risk.   Time 8   Period Weeks   Status New   PT LONG TERM GOAL #5   Title Pt will verbalize plans for continued community fitness upon D/C from PT.   Time 8   Period Weeks   Status New               Plan - 2015-01-24 1315    Clinical Impression Statement Pt continues to need cues for large amplitude movement and responds with cues but quickly reverts back to decreased amplitude.   Pt will benefit from skilled therapeutic intervention in order to improve on the following deficits Abnormal gait;Decreased activity tolerance;Decreased balance;Decreased endurance;Decreased mobility;Difficulty walking;Decreased strength;Improper body mechanics;Postural dysfunction   Rehab Potential Good   PT Frequency 2x / week   PT Duration 8 weeks   PT Treatment/Interventions ADLs/Self Care Home Management;Gait training;Stair training;Functional mobility training;Therapeutic activities;Neuromuscular re-education;Balance training;Therapeutic exercise;Patient/family education   PT Next Visit Plan gait, large amplitude standing movements and balance activities; treadmill gait, 4-square step/large amplitude step activities   Consulted and Agree with Plan of Care Patient          G-Codes - 2015/01/24 1946    Functional Assessment Tool Used --      Problem List Patient Active Problem List   Diagnosis Date Noted  . Sinus disease  07/28/2014  . MDD (major depressive disorder), recurrent episode, severe 02/10/2014  . Nonspecific abnormal electrocardiogram (ECG) (EKG) 02/07/2014  . Non-compliant behavior 02/03/2014  . Other abnormal glucose 10/02/2013  . Morton's neuroma of left foot 08/07/2013  . Stress fracture of left foot  07/17/2013  . 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 Major depressive disorder, recurrent episode, severe 03/13/2012  . Hyperglycemia 01/26/2012  . Hypogonadism male 01/24/2012  . Syncope and collapse 01/20/2012  .  OSA (obstructive sleep apnea) 01/17/2011  . CHEST TIGHTNESS 02/18/2008    Narda Bonds 01/06/2015, 8:24 PM  [facility] [address] [place], [place], [address] Phone: [phone]   Fax:  [place] 01/06/2015 8:24 PM Phone: [phone] Fax: [phone]

## 2015-01-06 NOTE — Therapy (Signed)
[facility] [address] [place], [place], [address] Phone: [phone]   Fax:  [phone]  Occupational Therapy Treatment  Patient Details  Name: [name] [name] MRN: [id number] Date of Birth: [birth year] Referring Provider:  Olga Millers, MD  Encounter Date: 01/06/2015      OT End of Session - 01/06/15 0940    Visit Number 9   Number of Visits 17   Date for OT Re-Evaluation 01/29/15   Authorization Type BCBS 75 visit limit combined, no auth   OT Start Time [phone]   OT Stop Time 1015   OT Time Calculation (min) 39 min   Activity Tolerance Patient tolerated treatment well   Behavior During Therapy [facility] for tasks assessed/performed      Past Medical History  Diagnosis Date  . Headache(784.0)   . Cardiac murmur     as a child  . Streptococcal meningitis     as an infant  . Depression   .  OSA (obstructive sleep apnea) 01/17/2011    npsg 2012:  AHI 67/hr. Auto titration 2012:  Optimal pressure 12cm.   Marland Kitchen HEADACHES, HX OF 02/18/2008    Qualifier: Diagnosis of  By: Danny Lawless CMA, [place]    . APPENDECTOMY, HX OF 02/18/2008    Qualifier: Diagnosis of  By: Danny Lawless CMA, [place]    . Parkinson disease 11/2014    Past Surgical History  Procedure Laterality Date  . Nasal sinus surgery      x 4 as a child  . Vasectomy    . Appendectomy  1967    There were no vitals filed for this visit.  Visit Diagnosis:  Bradykinesia  Rigidity  Lack of coordination  Decreased functional mobility and endurance  Abnormal posture      Subjective Assessment - 01/06/15 0938    Subjective  Pt reports he worked on cleaning out the attic this weekend   Pertinent History Diagnosed with Parkinson's approx 1 month ago.   Currently in Pain? No/denies      Treatment: Arm bike x 6 mins for reciprocal movement min v.c for speed, pt was able to maintain 38-41 RPM                 [facility]) - 01/06/15 0943    PWR!  exercises Moves in [place]! Up 20   PWR! Rock 20   PWR! Twist 20   PWR! Step 20   Comments performed modified quadraped over table               OT Short Term Goals - 12/31/14 0852    OT SHORT TERM GOAL #1   Title Pt will be independent with HEP--due 12/30/14   Time 4   Period Weeks   Status Achieved  12/29/14   OT SHORT TERM GOAL #2   Title Pt will verbalize understanding of PD symptoms and ways to prevent future complications.--due 12/30/14   Time 4   Period Weeks   Status Achieved  12/29/14   OT SHORT TERM GOAL #3   Title Pt will verbalize understanding of PD-specific appropriate community resources.--due 12/30/14   Baseline verbalizes understanding of POP and community fitness opportunities   Time 4   Period Weeks   Status Achieved   OT SHORT TERM GOAL #4   Title Pt will complete dressing fasteners mod I using strategies/AE prn.--due 12/30/14   Baseline Pt demonstrates understanding of button hook use, pt continues to report difficulty tying  apron/shoes   Time 4   Period Weeks   Status Partially Met   OT SHORT TERM GOAL #5   Title Pt will improve coordination/functional reaching as shown by improving score on box and blocks test by at least 10 with RUE.--due 12/30/14   Baseline 47 blocks on 12/31/14   Time 4   Period Weeks   Status Achieved           OT Long Term Goals - 12/01/14 0903    OT LONG TERM GOAL #1   Title Pt will verbalize understanding of AE/strategies for ADLs/IADLs prn.--due 01/29/15   Time 8   Period Weeks   Status New   OT LONG TERM GOAL #2   Title Pt will improve coordination for ADLs as shown by improving time on 9-hole peg test by at least 5 bilaterally.--due 01/29/15   Baseline R-32.59sec, L-35.66sec   Time 8   Period Weeks   Status New   OT LONG TERM GOAL #3   Title Pt will improve coordination/functional reaching as shown by improving score on box and blocks test by at least 5 with LUE.--due 01/29/15   Baseline 45   Time 8    Period Weeks   Status New   OT LONG TERM GOAL #4   Title Pt will improve coordination/functional reaching as shown by improving score on box and blocks test by at least 10 with RUE.--due 01/29/15   Baseline 37   Time 8   Period Weeks   Status New   OT LONG TERM GOAL #5   Title Pt will improve dressing ability as shown by improving time on PPT#4 by at least 5sec.--due 01/29/15   Baseline 24.53sec   Time 8   Period Weeks   Status New   Long Term Additional Goals   Additional Long Term Goals Yes   OT LONG TERM GOAL #6   Title Pt will improve bradykinesia as shown by improving time on PPT#2 by at least 5sec.--due 01/29/15   Baseline 15.35   Time 8   Period Weeks   Status New               Problem List Patient Active Problem List   Diagnosis Date Noted  . Sinus disease 07/28/2014  . MDD (major depressive disorder), recurrent episode, severe 02/10/2014  . Nonspecific abnormal electrocardiogram (ECG) (EKG) 02/07/2014  . Non-compliant behavior 02/03/2014  . Other abnormal glucose 10/02/2013  . Morton's neuroma of left foot 08/07/2013  . Stress fracture of left foot 07/17/2013  . 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 Major depressive disorder, recurrent episode, severe 03/13/2012  . Hyperglycemia 01/26/2012  . Hypogonadism male 01/24/2012  . Syncope and collapse 01/20/2012  .  OSA (obstructive sleep apnea) 01/17/2011  . CHEST TIGHTNESS 02/18/2008    RINE,KATHRYN 01/06/2015, 9:51 AM Theone Murdoch, OTR/L [phone] Phone: [phone] 9:52 AM 01/06/2015 [facility] [address] [place], [place], [address] Phone: [phone]   Fax:  [phone]

## 2015-01-06 NOTE — Patient Instructions (Signed)
You have to complete loud ah twice a day!

## 2015-01-07 ENCOUNTER — Ambulatory Visit (HOSPITAL_COMMUNITY): Payer: Self-pay | Admitting: Psychiatry

## 2015-01-08 ENCOUNTER — Encounter: Payer: Self-pay | Admitting: Occupational Therapy

## 2015-01-09 ENCOUNTER — Ambulatory Visit: Payer: Federal, State, Local not specified - PPO | Admitting: Occupational Therapy

## 2015-01-09 ENCOUNTER — Ambulatory Visit: Payer: Federal, State, Local not specified - PPO | Admitting: Physical Therapy

## 2015-01-09 ENCOUNTER — Ambulatory Visit: Payer: Federal, State, Local not specified - PPO

## 2015-01-09 DIAGNOSIS — Z7409 Other reduced mobility: Secondary | ICD-10-CM

## 2015-01-09 DIAGNOSIS — R490 Dysphonia: Secondary | ICD-10-CM

## 2015-01-09 DIAGNOSIS — G20 Parkinson's disease: Secondary | ICD-10-CM | POA: Diagnosis not present

## 2015-01-09 DIAGNOSIS — R258 Other abnormal involuntary movements: Secondary | ICD-10-CM

## 2015-01-09 DIAGNOSIS — R279 Unspecified lack of coordination: Secondary | ICD-10-CM

## 2015-01-09 DIAGNOSIS — R269 Unspecified abnormalities of gait and mobility: Secondary | ICD-10-CM

## 2015-01-09 DIAGNOSIS — R29898 Other symptoms and signs involving the musculoskeletal system: Secondary | ICD-10-CM

## 2015-01-09 NOTE — Therapy (Signed)
[facility] [address] [place], [place], [address] Phone: [phone]   Fax:  [phone]  Physical Therapy Treatment  Patient Details  Name: [name] [name] MRN: [id number] Date of Birth: [birth year] Referring Provider:  Olga Millers, MD  Encounter Date: 01/09/2015      PT End of Session - 01/09/15 1234    Visit Number 11   Number of Visits 17   Date for PT Re-Evaluation 01/30/15   Authorization Type BCBS 75 visit limit combined   PT Start Time 0934   PT Stop Time 1013   PT Time Calculation (min) 39 min   Activity Tolerance Patient tolerated treatment well   Behavior During Therapy [facility] for tasks assessed/performed      Past Medical History  Diagnosis Date  . Headache(784.0)   . Cardiac murmur     as a child  . Streptococcal meningitis     as an infant  . Depression   .  OSA (obstructive sleep apnea) 01/17/2011    npsg 2012:  AHI 67/hr. Auto titration 2012:  Optimal pressure 12cm.   Marland Kitchen HEADACHES, HX OF 02/18/2008    Qualifier: Diagnosis of  By: Danny Lawless CMA, [place]    . APPENDECTOMY, HX OF 02/18/2008    Qualifier: Diagnosis of  By: Danny Lawless CMA, [place]    . Parkinson disease 11/2014    Past Surgical History  Procedure Laterality Date  . Nasal sinus surgery      x 4 as a child  . Vasectomy    . Appendectomy  1967    There were no vitals filed for this visit.  Visit Diagnosis:  Abnormality of gait      Subjective Assessment - 01/09/15 0935    Subjective Changed work hours this week, to work more later afternoon to evening.  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   Currently in Pain? No/denies        Gait training:  Treadmill gait training x 8 minutes at between 1.2>1.5 mph with bilateral UE support.  PT provides intermittent cues for upright posture and increased step length, increased heelstrike.  Gait activities on treadmill with environmental scanning for improved dual  tasking while keeping large amplitude steps and foot clearance.  Over ground gait x >1000 ft indoor surfaces with use of walking poles for facilitation of increased arm swing and increased step length/foot clearance.  Without walking poles with quick/start stops and forward/backward walking and walking >400 ft with cognitive dual tasking activity.  Used walking poles bilaterally to address improved walking posture, arm swing and foot clearance for improved walking for exercise.  Discussed where to find walking poles, how to fit, benefits of walking poles used for people with PD, and locations for potential walking for exercise near where patient lives.                       PT Education - 01/09/15 1234    Education provided Yes   Education Details use of walking poles/where to find/benefits; locations for indoor walking near where pt lives   Person(s) Educated Patient   Methods Explanation;Demonstration;Handout   Comprehension Verbalized understanding;Returned demonstration          PT Short Term Goals - 12/31/14 1025    PT SHORT TERM GOAL #1   Title Pt will be independent with HEP for improved transfers, balance, and gait. (Target 12/30/14)   Status Partially Met   PT SHORT TERM  GOAL #2   Title Pt will improve 5x sit<>stand to less than or equal to 11.5 seconds for improved transfer efficiency and safety.   Status Achieved   PT SHORT TERM GOAL #3   Title Pt will improve Functional Gait Assessment score to at least 18/30 for decreased fall risk.   Status Achieved   PT SHORT TERM GOAL #4   Title Pt will improve TUG score to less than or equal to 13.5 seconds for decreased fall risk.   Status Achieved   PT SHORT TERM GOAL #5   Title Pt will verbalize understanding of local Parkinson's-disease related resources.   Status Achieved           PT Long Term Goals - 12/29/14 1053    PT LONG TERM GOAL #1   Title Pt will verbalize understanding of fall prevention  techniques within the home environment. (Target 01/30/15)   Time 8   Period Weeks   Status New   PT LONG TERM GOAL #2   Title Pt will improve gait velocity to at least 2.62 ft/sec for improved gait efficiency and safety.   Time 8   Period Weeks   Status New   PT LONG TERM GOAL #3   Title Pt will improve Functional Gait Assessment to at least 22/30 for decreased fall risk.   Time 8   Period Weeks   Status Achieved  24/30 on 12/29/14   PT LONG TERM GOAL #4   Title Pt will improve TUG cognitive score to less than or equal to 15 seconds for decreased fall risk.   Time 8   Period Weeks   Status New   PT LONG TERM GOAL #5   Title Pt will verbalize plans for continued community fitness upon D/C from PT.   Time 8   Period Weeks   Status New               Plan - 01/09/15 1235    Clinical Impression Statement Pt uses walking poles for improved walking intensity, foot clearance, arm swing, and posture with good sequence, and good response to using poles.   Pt will benefit from skilled therapeutic intervention in order to improve on the following deficits Abnormal gait;Decreased activity tolerance;Decreased balance;Decreased endurance;Decreased mobility;Difficulty walking;Decreased strength;Improper body mechanics;Postural dysfunction   Rehab Potential Good   PT Frequency 2x / week   PT Duration 8 weeks   PT Treatment/Interventions ADLs/Self Care Home Management;Gait training;Stair training;Functional mobility training;Therapeutic activities;Neuromuscular re-education;Balance training;Therapeutic exercise;Patient/family education   PT Next Visit Plan gait, large amplitude standing movements and balance activities; treadmill gait, 4-square step/large amplitude step activities, walking poles outdoors/dual task activities with gait   Consulted and Agree with Plan of Care Patient        Problem List Patient Active Problem List   Diagnosis Date Noted  . Sinus disease 07/28/2014  .  MDD (major depressive disorder), recurrent episode, severe 02/10/2014  . Nonspecific abnormal electrocardiogram (ECG) (EKG) 02/07/2014  . Non-compliant behavior 02/03/2014  . Other abnormal glucose 10/02/2013  . Morton's neuroma of left foot 08/07/2013  . Stress fracture of left foot 07/17/2013  . 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 Major depressive disorder, recurrent episode, severe 03/13/2012  . Hyperglycemia 01/26/2012  . Hypogonadism male 01/24/2012  . Syncope and collapse 01/20/2012  .  OSA (obstructive sleep apnea) 01/17/2011  . CHEST TIGHTNESS 02/18/2008    Rian Busche W. 01/09/2015, 12:37 PM  Mady Haagensen, PT 01/09/2015 12:37 PM Phone: [phone]  Fax: [facility] [address] [place], [place], [address] Phone: [phone]   Fax:  [phone]

## 2015-01-09 NOTE — Therapy (Addendum)
[facility] [address] [place], [place], [address] Phone: [phone]   Fax:  [phone]  Occupational Therapy Treatment  Patient Details  Name: [name] [name] MRN: [id number] Date of Birth: 05-01-1951 Referring Provider:  Olga Millers, MD  Encounter Date: 01/09/2015      OT End of Session - 01/14/15 0940    Visit Number 12   Number of Visits 17   Date for OT Re-Evaluation 01/29/15   Authorization Type [employer] 75 visit limit combined, no auth   OT Start Time [phone]   OT Stop Time 1015   OT Time Calculation (min) 42 min   Activity Tolerance Patient tolerated treatment well   Behavior During Therapy [facility] for tasks assessed/performed      Past Medical History  Diagnosis Date  . Headache(784.0)   . Cardiac murmur     as a child  . Streptococcal meningitis     as an infant  . Depression   .  OSA (obstructive sleep apnea) 01/17/2011    npsg 2012:  AHI 67/hr. Auto titration 2012:  Optimal pressure 12cm.   Marland Kitchen HEADACHES, HX OF 02/18/2008    Qualifier: Diagnosis of  By: Danny Lawless CMA, [place]    . APPENDECTOMY, HX OF 02/18/2008    Qualifier: Diagnosis of  By: Danny Lawless CMA, [place]    . Parkinson disease 11/2014    Past Surgical History  Procedure Laterality Date  . Nasal sinus surgery      x 4 as a child  . Vasectomy    . Appendectomy  1967    There were no vitals filed for this visit.  Visit Diagnosis:  Bradykinesia - Plan: Ot plan of care cert/re-cert  Rigidity - Plan: Ot plan of care cert/re-cert  Lack of coordination - Plan: Ot plan of care cert/re-cert  Decreased functional mobility and endurance - Plan: Ot plan of care cert/re-cert      Subjective Assessment - 01/14/15 0934    Subjective  Pt reports he worked some with theraband this a.m.   Pertinent History Diagnosed with Parkinson's approx 1 month ago.   Currently in Pain? No/denies   Multiple Pain Sites No            Treatment: Arm  bike x 6 mins level 1 for warm up/ conditioning, min v.c for speed, pt maintained 38-40 RPMs. Dynamic step and reach with trunk rotation, with emphasis on large amplitude movements and positioning, to toss scarves to target, min v.c, close supervision for balance. Tossing scarf  nd catching with PWR ! Hands, min v.c. Pt was instructed in big movements with ADLS and provided with handout.                 OT Education - 01/13/15 0932    Education provided Yes   Education Details red theraband    Person(s) Educated Patient   Methods Explanation;Demonstration;Handout   Comprehension Verbalized understanding;Returned demonstration;Verbal cues required          OT Short Term Goals - 12/31/14 0852    OT SHORT TERM GOAL #1   Title Pt will be independent with HEP--due 12/30/14   Time 4   Period Weeks   Status Achieved  12/29/14   OT SHORT TERM GOAL #2   Title Pt will verbalize understanding of PD symptoms and ways to prevent future complications.--due 12/30/14   Time 4   Period Weeks   Status Achieved  12/29/14   OT SHORT TERM GOAL #3  Title Pt will verbalize understanding of PD-specific appropriate community resources.--due 12/30/14   Baseline verbalizes understanding of POP and community fitness opportunities   Time 4   Period Weeks   Status Achieved   OT SHORT TERM GOAL #4   Title Pt will complete dressing fasteners mod I using strategies/AE prn.--due 12/30/14   Baseline Pt demonstrates understanding of button hook use, pt continues to report difficulty tying apron/shoes   Time 4   Period Weeks   Status Partially Met   OT SHORT TERM GOAL #5   Title Pt will improve coordination/functional reaching as shown by improving score on box and blocks test by at least 10 with RUE.--due 12/30/14   Baseline 47 blocks on 12/31/14   Time 4   Period Weeks   Status Achieved           OT Long Term Goals - 12/01/14 0903    OT LONG TERM GOAL #1   Title Pt will verbalize  understanding of AE/strategies for ADLs/IADLs prn.--due 01/29/15   Time 8   Period Weeks   Status New   OT LONG TERM GOAL #2   Title Pt will improve coordination for ADLs as shown by improving time on 9-hole peg test by at least 5 bilaterally.--due 01/29/15   Baseline R-32.59sec, L-35.66sec   Time 8   Period Weeks   Status New   OT LONG TERM GOAL #3   Title Pt will improve coordination/functional reaching as shown by improving score on box and blocks test by at least 5 with LUE.--due 01/29/15   Baseline 45   Time 8   Period Weeks   Status New   OT LONG TERM GOAL #4   Title Pt will improve coordination/functional reaching as shown by improving score on box and blocks test by at least 10 with RUE.--due 01/29/15   Baseline 37   Time 8   Period Weeks   Status New   OT LONG TERM GOAL #5   Title Pt will improve dressing ability as shown by improving time on PPT#4 by at least 5sec.--due 01/29/15   Baseline 24.53sec   Time 8   Period Weeks   Status New   Long Term Additional Goals   Additional Long Term Goals Yes   OT LONG TERM GOAL #6   Title Pt will improve bradykinesia as shown by improving time on PPT#2 by at least 5sec.--due 01/29/15   Baseline 15.35   Time 8   Period Weeks   Status New               Plan - 01/14/15 0940    Clinical Impression Statement Pt is progressing towards goals with good understanding of updated HEP.   Plan coordination, large amplitude movements   OT Home Exercise Plan Has been issued:  PWR! moves seated, coordination/ PWR! hands HEP, PWR! moves (basic 4) in prone and quadraped (12/29/14) ways to incorporate big movments with ADLS. red theraband issued 01/13/15   Consulted and Agree with Plan of Care Patient        Problem List Patient Active Problem List   Diagnosis Date Noted  . Sinus disease 07/28/2014  . MDD (major depressive disorder), recurrent episode, severe 02/10/2014  . Nonspecific abnormal electrocardiogram (ECG) (EKG) 02/07/2014   . Non-compliant behavior 02/03/2014  . Other abnormal glucose 10/02/2013  . Morton's neuroma of left foot 08/07/2013  . Stress fracture of left foot 07/17/2013  . 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 Major  depressive disorder, recurrent episode, severe 03/13/2012  . Hyperglycemia 01/26/2012  . Hypogonadism male 01/24/2012  . Syncope and collapse 01/20/2012  .  OSA (obstructive sleep apnea) 01/17/2011  . CHEST TIGHTNESS 02/18/2008    Tylar Merendino 01/14/2015, 9:49 AM Theone Murdoch, OTR/L [phone] Phone: [phone] 9:49 AM 01/14/2015 [facility] [address] [place], [place], [address] Phone: [phone]   Fax:  [phone]

## 2015-01-09 NOTE — Therapy (Signed)
[facility] [address] [place], [place], [address] Phone: [phone]   Fax:  [phone]  Speech Language Pathology Treatment  Patient Details  Name: [name] [name] MRN: [id number] Date of Birth: 1951/08/06 Referring Provider:  Olga Millers, MD  Encounter Date: 01/09/2015      End of Session - 01/09/15 0853    Visit Number 11   Number of Visits 16   Date for SLP Re-Evaluation 01/29/15   SLP Start Time 0803   SLP Stop Time  0846   SLP Time Calculation (min) 43 min   Activity Tolerance Patient tolerated treatment well      Past Medical History  Diagnosis Date  . Headache(784.0)   . Cardiac murmur     as a child  . Streptococcal meningitis     as an infant  . Depression   .  OSA (obstructive sleep apnea) 01/17/2011    npsg 2012:  AHI 67/hr. Auto titration 2012:  Optimal pressure 12cm.   Marland Kitchen HEADACHES, HX OF 02/18/2008    Qualifier: Diagnosis of  By: Danny Lawless CMA, [place]    . APPENDECTOMY, HX OF 02/18/2008    Qualifier: Diagnosis of  By: Danny Lawless CMA, [place]    . Parkinson disease 11/2014    Past Surgical History  Procedure Laterality Date  . Nasal sinus surgery      x 4 as a child  . Vasectomy    . Appendectomy  1967    There were no vitals filed for this visit.  Visit Diagnosis: Hypokinetic Parkinsonian dysphonia      Subjective Assessment - 01/09/15 0809    Subjective "I have been practicing." (loud /a/)               ADULT SLP TREATMENT - 01/09/15 0809    General Information   Behavior/Cognition Alert;Cooperative;Pleasant mood   Treatment Provided   Treatment provided Cognitive-Linquistic   Pain Assessment   Pain Assessment No/denies pain   Cognitive-Linquistic Treatment   Treatment focused on Dysarthria   Skilled Treatment Loud /a/ completed average 86dB. Pt responded with sentences with average 72dB. Pt with simple- mod complex conversation with average 70dB and abdominal  breathing.  Facial exercises completed    Assessment / Recommendations / Plan   Plan Continue with current plan of care   Progression Toward Goals   Progression toward goals Progressing toward goals          SLP Education - 01/09/15 0853    Education provided Yes   Education Details HEP for facial expressions   Methods Explanation;Demonstration   Comprehension Verbalized understanding;Returned demonstration;Verbal cues required          SLP Short Term Goals - 01/09/15 0819    SLP SHORT TERM GOAL #1   Title Pt will demonstrate abdominal breathing in structured simple conversation for 10 minutes with occassional minimal cues.12/29/14   Time --   Period --   Status Not Met   SLP SHORT TERM GOAL #2   Title Pt will demonstrate average of 84dB for sustained /a/ over 3 sessions with rare minimal assistance (12/29/14)   Time --   Period --   Status Not Met   SLP SHORT TERM GOAL #3   Title Pt will average 70dB in simple structured speech tasks over 20 minutes with occassional minimal assistance (12/29/14)   Status Achieved          SLP Long Term Goals - 01/09/15 0819    SLP LONG  TERM GOAL #1   Title Demonstrate abdominal breathing for mildly complex conversation for 15 minutes with occassional minimal cues (01/29/15)   Time 4   Period Weeks   Status Achieved   SLP LONG TERM GOAL #2   Title Pt will maintain an average of 70dBfor modcomplex conversation over 15 minutes with rare minimal assistance (01/29/15)   Time 4   Period Weeks   Status Revised   SLP LONG TERM GOAL #3   Title Pt will converse audibly in noisy environment for 15 minutes with rare minimal assistance (01/29/15)   Time 4   Period Weeks   Status On-going          Plan - 01/09/15 6237    Clinical Impression Statement Cont ST needed to transfer louder speech to more diverse number of situations/environments outside [facility].   Speech Therapy Frequency 2x / week   Duration --  3 weeks    Treatment/Interventions Compensatory strategies;Functional tasks;Patient/family education;SLP instruction and feedback;Internal/external aids   Potential to Achieve Goals Good   Potential Considerations Severity of impairments        Problem List Patient Active Problem List   Diagnosis Date Noted  . Sinus disease 07/28/2014  . MDD (major depressive disorder), recurrent episode, severe 02/10/2014  . Nonspecific abnormal electrocardiogram (ECG) (EKG) 02/07/2014  . Non-compliant behavior 02/03/2014  . Other abnormal glucose 10/02/2013  . Morton's neuroma of left foot 08/07/2013  . Stress fracture of left foot 07/17/2013  . 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 Major depressive disorder, recurrent episode, severe 03/13/2012  . Hyperglycemia 01/26/2012  . Hypogonadism male 01/24/2012  . Syncope and collapse 01/20/2012  .  OSA (obstructive sleep apnea) 01/17/2011  . CHEST TIGHTNESS 02/18/2008    [facility], SLP 01/09/2015, 9:29 AM  [facility] [address] [place], [place], [address] Phone: [phone]   Fax:  [phone]

## 2015-01-09 NOTE — Patient Instructions (Signed)
Practice facial expressions at least once/day; separate mad and sad expressions

## 2015-01-09 NOTE — Patient Instructions (Signed)
Check in to walking for exercises:  Some options are 2 local churches which open their fellowship halls for walking during the week: -[name] [name]   Can look into walking poles for walking long distances or outdoor surfaces for increased intensity of walking for exercise, increased arm swing and posture, increased foot clearance and step length: -Look up walking poles or trekking poles online-base model sets are about $20.  Make sure they have rubber tips -Could look at [employer] or sporting goods stores

## 2015-01-09 NOTE — Patient Instructions (Signed)

## 2015-01-12 ENCOUNTER — Encounter: Payer: Self-pay | Admitting: Occupational Therapy

## 2015-01-12 NOTE — Addendum Note (Signed)
Addended by: Vianne Bulls D on: 01/12/2015 01:15 PM   Modules accepted: Orders

## 2015-01-13 ENCOUNTER — Ambulatory Visit: Payer: Federal, State, Local not specified - PPO | Admitting: Physical Therapy

## 2015-01-13 ENCOUNTER — Ambulatory Visit: Payer: Federal, State, Local not specified - PPO | Admitting: Occupational Therapy

## 2015-01-13 ENCOUNTER — Ambulatory Visit: Payer: Federal, State, Local not specified - PPO

## 2015-01-13 DIAGNOSIS — R293 Abnormal posture: Secondary | ICD-10-CM

## 2015-01-13 DIAGNOSIS — Z7409 Other reduced mobility: Secondary | ICD-10-CM

## 2015-01-13 DIAGNOSIS — R269 Unspecified abnormalities of gait and mobility: Secondary | ICD-10-CM

## 2015-01-13 DIAGNOSIS — R279 Unspecified lack of coordination: Secondary | ICD-10-CM

## 2015-01-13 DIAGNOSIS — R258 Other abnormal involuntary movements: Secondary | ICD-10-CM

## 2015-01-13 DIAGNOSIS — G20 Parkinson's disease: Secondary | ICD-10-CM | POA: Diagnosis not present

## 2015-01-13 DIAGNOSIS — R490 Dysphonia: Secondary | ICD-10-CM

## 2015-01-13 DIAGNOSIS — R29898 Other symptoms and signs involving the musculoskeletal system: Secondary | ICD-10-CM

## 2015-01-13 NOTE — Therapy (Signed)
[facility] [address] [place], [place], [address] Phone: [phone]   Fax:  [phone]  Occupational Therapy Treatment  Patient Details  Name: [name] [name] MRN: [id number] Date of Birth: 1950/11/14 Referring Provider:  Olga Millers, MD  Encounter Date: 01/13/2015      OT End of Session - 01/13/15 0910    Visit Number 11   Number of Visits 17   Date for OT Re-Evaluation 01/29/15   Authorization Type [employer] 75 visit limit combined, no auth   OT Start Time [phone]   OT Stop Time 0930   OT Time Calculation (min) 40 min   Activity Tolerance Patient tolerated treatment well   Behavior During Therapy [facility] for tasks assessed/performed      Past Medical History  Diagnosis Date  . Headache(784.0)   . Cardiac murmur     as a child  . Streptococcal meningitis     as an infant  . Depression   .  OSA (obstructive sleep apnea) 01/17/2011    npsg 2012:  AHI 67/hr. Auto titration 2012:  Optimal pressure 12cm.   Marland Kitchen HEADACHES, HX OF 02/18/2008    Qualifier: Diagnosis of  By: Danny Lawless CMA, [place]    . APPENDECTOMY, HX OF 02/18/2008    Qualifier: Diagnosis of  By: Danny Lawless CMA, [place]    . Parkinson disease 11/2014    Past Surgical History  Procedure Laterality Date  . Nasal sinus surgery      x 4 as a child  . Vasectomy    . Appendectomy  1967    There were no vitals filed for this visit.  Visit Diagnosis:  Rigidity  Lack of coordination  Bradykinesia  Abnormal posture  Decreased functional mobility and endurance      Subjective Assessment - 01/13/15 0856    Pertinent History Diagnosed with Parkinson's approx 1 month ago.   Currently in Pain? No/denies        Pt was instructed in red theraband HEP for shoulder flexion, extension and triceps extension, 10 reps each. Armbike x 6 mins level 1 for conditioning, pt was able to maintain 40 RPM. Fine motor coordination task, placing O'connor pegs with  LUE and removing with emphasis on in hand manipulation, min v.c., PWR! Hands prior to task.                    OT Education - 01/13/15 0932    Education provided Yes   Education Details red theraband    Person(s) Educated Patient   Methods Explanation;Demonstration;Handout   Comprehension Verbalized understanding;Returned demonstration;Verbal cues required          OT Short Term Goals - 12/31/14 0852    OT SHORT TERM GOAL #1   Title Pt will be independent with HEP--due 12/30/14   Time 4   Period Weeks   Status Achieved  12/29/14   OT SHORT TERM GOAL #2   Title Pt will verbalize understanding of PD symptoms and ways to prevent future complications.--due 12/30/14   Time 4   Period Weeks   Status Achieved  12/29/14   OT SHORT TERM GOAL #3   Title Pt will verbalize understanding of PD-specific appropriate community resources.--due 12/30/14   Baseline verbalizes understanding of POP and community fitness opportunities   Time 4   Period Weeks   Status Achieved   OT SHORT TERM GOAL #4   Title Pt will complete dressing fasteners mod I using strategies/AE prn.--due 12/30/14  Baseline Pt demonstrates understanding of button hook use, pt continues to report difficulty tying apron/shoes   Time 4   Period Weeks   Status Partially Met   OT SHORT TERM GOAL #5   Title Pt will improve coordination/functional reaching as shown by improving score on box and blocks test by at least 10 with RUE.--due 12/30/14   Baseline 47 blocks on 12/31/14   Time 4   Period Weeks   Status Achieved           OT Long Term Goals - 12/01/14 0903    OT LONG TERM GOAL #1   Title Pt will verbalize understanding of AE/strategies for ADLs/IADLs prn.--due 01/29/15   Time 8   Period Weeks   Status New   OT LONG TERM GOAL #2   Title Pt will improve coordination for ADLs as shown by improving time on 9-hole peg test by at least 5 bilaterally.--due 01/29/15   Baseline R-32.59sec, L-35.66sec   Time 8    Period Weeks   Status New   OT LONG TERM GOAL #3   Title Pt will improve coordination/functional reaching as shown by improving score on box and blocks test by at least 5 with LUE.--due 01/29/15   Baseline 45   Time 8   Period Weeks   Status New   OT LONG TERM GOAL #4   Title Pt will improve coordination/functional reaching as shown by improving score on box and blocks test by at least 10 with RUE.--due 01/29/15   Baseline 37   Time 8   Period Weeks   Status New   OT LONG TERM GOAL #5   Title Pt will improve dressing ability as shown by improving time on PPT#4 by at least 5sec.--due 01/29/15   Baseline 24.53sec   Time 8   Period Weeks   Status New   Long Term Additional Goals   Additional Long Term Goals Yes   OT LONG TERM GOAL #6   Title Pt will improve bradykinesia as shown by improving time on PPT#2 by at least 5sec.--due 01/29/15   Baseline 15.35   Time 8   Period Weeks   Status New               Plan - 01/13/15 0857    OT Home Exercise Plan Has been issued:  PWR! moves seated, coordination/ PWR! hands HEP, PWR! moves (basic 4) in prone and quadraped (12/29/14) ways to incorporate big movments with ADLS.   Consulted and Agree with Plan of Care Patient        Problem List Patient Active Problem List   Diagnosis Date Noted  . Sinus disease 07/28/2014  . MDD (major depressive disorder), recurrent episode, severe 02/10/2014  . Nonspecific abnormal electrocardiogram (ECG) (EKG) 02/07/2014  . Non-compliant behavior 02/03/2014  . Other abnormal glucose 10/02/2013  . Morton's neuroma of left foot 08/07/2013  . Stress fracture of left foot 07/17/2013  . 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 Major depressive disorder, recurrent episode, severe 03/13/2012  . Hyperglycemia 01/26/2012  . Hypogonadism male 01/24/2012  . Syncope and collapse 01/20/2012  .  OSA (obstructive sleep apnea) 01/17/2011  . CHEST TIGHTNESS 02/18/2008     Bradie Sangiovanni 01/13/2015, 9:33 AM Theone Murdoch, OTR/L [phone] Phone: [phone] 9:33 AM 01/13/2015 [facility] [address] [place], [place], [address] Phone: [phone]   Fax:  [phone]

## 2015-01-13 NOTE — Therapy (Signed)
[facility] [address] [place], [place], [address] Phone: [phone]   Fax:  [phone]  Physical Therapy Treatment  Patient Details  Name: [name] [name] MRN: [id number] Date of Birth: 07-26-1951 Referring Provider:  Olga Millers, MD  Encounter Date: 01/13/2015      PT End of Session - 01/13/15 1714    Visit Number 12   Number of Visits 17   Date for PT Re-Evaluation 01/30/15   Authorization Type BCBS 75 visit limit combined   PT Start Time 0803   PT Stop Time 0845   PT Time Calculation (min) 42 min   Activity Tolerance Patient tolerated treatment well   Behavior During Therapy [facility] for tasks assessed/performed      Past Medical History  Diagnosis Date  . Headache(784.0)   . Cardiac murmur     as a child  . Streptococcal meningitis     as an infant  . Depression   .  OSA (obstructive sleep apnea) 01/17/2011    npsg 2012:  AHI 67/hr. Auto titration 2012:  Optimal pressure 12cm.   Marland Kitchen HEADACHES, HX OF 02/18/2008    Qualifier: Diagnosis of  By: Danny Lawless CMA, [place]    . APPENDECTOMY, HX OF 02/18/2008    Qualifier: Diagnosis of  By: Danny Lawless CMA, [place]    . Parkinson disease 11/2014    Past Surgical History  Procedure Laterality Date  . Nasal sinus surgery      x 4 as a child  . Vasectomy    . Appendectomy  1967    There were no vitals filed for this visit.  Visit Diagnosis:  Abnormality of gait  Bradykinesia      Subjective Assessment - 01/13/15 0806    Subjective doing well; no changes, feel I'm shuffling more today.   Currently in Pain? No/denies                       [facility] Adult PT Treatment/Exercise - 01/13/15 0001    Ambulation/Gait   Gait Comments Gait activities with obstacle course, including stepping over hurdles, around hula-hoops in figure-8 pattern, carrying objects in 1 hand, tossing ball with gait and obstacle negotiation   Lumbar Exercises: Aerobic    Stationary Bike Scifit level 2.0>2.2 all 4 extremities x 8 minutes with bouts of increased intensity  Cues for intensity >80 RPM; pt's self-selected speed is 71      Gait activities (continued)-Gait carrying 10 pound weighted box in bilateral UEs, indoor, level surfaces x 120 ft, cues for increased step length.  Squatting to put weighted box on floor then pick up x 3 reps.  Stair negotiation x 2 reps with one rail, step through pattern.  Gait with no device x 120 ft with cues for increased arm swing and increased step length, then gait x 400 ft with environmental scanning tasks looking at cards during gait.    Neuro re-education:  Standing on incline and decline on ramp-marching in place, forward step-taps x 10 reps each, then head turns x 10, head nods x 10, then standing with eyes closed x 10 seconds.  PT provided cues for upright posture and visual target for improved balance on compliant surface, decreased need for visual input looking at ground.            PT Short Term Goals - 12/31/14 1025    PT SHORT TERM GOAL #1   Title Pt will be independent with HEP for  improved transfers, balance, and gait. (Target 12/30/14)   Status Partially Met   PT SHORT TERM GOAL #2   Title Pt will improve 5x sit<>stand to less than or equal to 11.5 seconds for improved transfer efficiency and safety.   Status Achieved   PT SHORT TERM GOAL #3   Title Pt will improve Functional Gait Assessment score to at least 18/30 for decreased fall risk.   Status Achieved   PT SHORT TERM GOAL #4   Title Pt will improve TUG score to less than or equal to 13.5 seconds for decreased fall risk.   Status Achieved   PT SHORT TERM GOAL #5   Title Pt will verbalize understanding of local Parkinson's-disease related resources.   Status Achieved           PT Long Term Goals - 12/29/14 1053    PT LONG TERM GOAL #1   Title Pt will verbalize understanding of fall prevention techniques within the home environment.  (Target 01/30/15)   Time 8   Period Weeks   Status New   PT LONG TERM GOAL #2   Title Pt will improve gait velocity to at least 2.62 ft/sec for improved gait efficiency and safety.   Time 8   Period Weeks   Status New   PT LONG TERM GOAL #3   Title Pt will improve Functional Gait Assessment to at least 22/30 for decreased fall risk.   Time 8   Period Weeks   Status Achieved  24/30 on 12/29/14   PT LONG TERM GOAL #4   Title Pt will improve TUG cognitive score to less than or equal to 15 seconds for decreased fall risk.   Time 8   Period Weeks   Status New   PT LONG TERM GOAL #5   Title Pt will verbalize plans for continued community fitness upon D/C from PT.   Time 8   Period Weeks   Status New               Plan - 01/13/15 1714    Clinical Impression Statement Pt not noted to have any difficulty iwth foot clearance during varied gait activities today; however, he is still reporting feeling of shuffling with gait.  Advised pt to speak with physician if shuffling gait continues to be prolematic, especially certain times of the day.   Pt will benefit from skilled therapeutic intervention in order to improve on the following deficits Abnormal gait;Decreased activity tolerance;Decreased balance;Decreased endurance;Decreased mobility;Difficulty walking;Decreased strength;Improper body mechanics;Postural dysfunction   Rehab Potential Good   PT Frequency 2x / week   PT Duration 8 weeks   PT Treatment/Interventions ADLs/Self Care Home Management;Gait training;Stair training;Functional mobility training;Therapeutic activities;Neuromuscular re-education;Balance training;Therapeutic exercise;Patient/family education   PT Next Visit Plan gait, large amplitude standing movements and balance activities; treadmill gait, 4-square step/large amplitude step activities, walking poles outdoors/dual task activities with gait   Consulted and Agree with Plan of Care Patient        Problem  List Patient Active Problem List   Diagnosis Date Noted  . Sinus disease 07/28/2014  . MDD (major depressive disorder), recurrent episode, severe 02/10/2014  . Nonspecific abnormal electrocardiogram (ECG) (EKG) 02/07/2014  . Non-compliant behavior 02/03/2014  . Other abnormal glucose 10/02/2013  . Morton's neuroma of left foot 08/07/2013  . Stress fracture of left foot 07/17/2013  . 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 Major depressive disorder, recurrent episode, severe 03/13/2012  . Hyperglycemia 01/26/2012  .  Hypogonadism male 01/24/2012  . Syncope and collapse 01/20/2012  .  OSA (obstructive sleep apnea) 01/17/2011  . CHEST TIGHTNESS 02/18/2008    Anali Cabanilla W. 01/13/2015, 5:19 PM  Amylase No results found for: [facility] [address] [place], [place], [address] Phone: [phone]   Fax:  [phone]

## 2015-01-13 NOTE — Patient Instructions (Signed)
  Strengthening: Resisted Flexion   Hold tubing with _right then left arm(s) at side. Pull forward and up. Move shoulder through pain-free range of motion. Repeat __10__ times per set.  Do _1-2_ sessions per day , every other day   Strengthening: Resisted Extension   Hold tubing in _both____ hand(s), arm forward. Pull arm back, elbow straight. Repeat _10___ times per set. Do _1-2___ sessions per day, every other day.   Resisted Horizontal Abduction: Bilateral   Sit or stand, tubing in both hands, arms out in front. Keeping arms straight, pinch shoulder blades together and stretch arms out. Repeat _10___ times per set. Do _1-2___ sessions per day, every other day. Hold hands lower!    Elbow Extension: Resisted   Stand holding band with both hands right( then left_)____ elbow bent. Straighten elbow. Repeat _10___ times per set.  Do _1-2___ sessions per day, every other day.   Copyright  VHI. All rights reserved.

## 2015-01-13 NOTE — Therapy (Signed)
[facility] [address] [place], [place], [address] Phone: [phone]   Fax:  [phone]  Speech Language Pathology Treatment  Patient Details  Name: [name] [name] MRN: [id number] Date of Birth: May 14, 1951 Referring Provider:  Olga Millers, MD  Encounter Date: 01/13/2015      End of Session - 01/13/15 1011    Visit Number 12   Number of Visits 16   Date for SLP Re-Evaluation 01/29/15   SLP Start Time 0933   SLP Stop Time  1015   SLP Time Calculation (min) 42 min   Activity Tolerance Patient tolerated treatment well      Past Medical History  Diagnosis Date  . Headache(784.0)   . Cardiac murmur     as a child  . Streptococcal meningitis     as an infant  . Depression   .  OSA (obstructive sleep apnea) 01/17/2011    npsg 2012:  AHI 67/hr. Auto titration 2012:  Optimal pressure 12cm.   Marland Kitchen HEADACHES, HX OF 02/18/2008    Qualifier: Diagnosis of  By: Danny Lawless CMA, [place]    . APPENDECTOMY, HX OF 02/18/2008    Qualifier: Diagnosis of  By: Danny Lawless CMA, [place]    . Parkinson disease 11/2014    Past Surgical History  Procedure Laterality Date  . Nasal sinus surgery      x 4 as a child  . Vasectomy    . Appendectomy  1967    There were no vitals filed for this visit.  Visit Diagnosis: Hypokinetic Parkinsonian dysphonia      Subjective Assessment - 01/13/15 0942    Subjective Pt went out to eat Saturday at [employer] at 6 pm. Pt did not notice difficulty with anyone hearing/understanding him.               ADULT SLP TREATMENT - 01/13/15 0945    General Information   Behavior/Cognition Alert;Cooperative;Pleasant mood   Treatment Provided   Treatment provided Cognitive-Linquistic   Pain Assessment   Pain Assessment No/denies pain   Cognitive-Linquistic Treatment   Treatment focused on Dysarthria   Skilled Treatment Loud /a/ completed average 89dB with rare min A for vocal quality -  needed to do "hey" 60% of the time. Conversation - mod complex: pt benefitted from SLP cues rarely, to maintain volume at 70dB.   Assessment / Recommendations / Plan   Plan Continue with current plan of care   Progression Toward Goals   Progression toward goals Progressing toward goals            SLP Short Term Goals - 01/09/15 0819    SLP SHORT TERM GOAL #1   Title Pt will demonstrate abdominal breathing in structured simple conversation for 10 minutes with occassional minimal cues.12/29/14   Time --   Period --   Status Not Met   SLP SHORT TERM GOAL #2   Title Pt will demonstrate average of 84dB for sustained /a/ over 3 sessions with rare minimal assistance (12/29/14)   Time --   Period --   Status Not Met   SLP SHORT TERM GOAL #3   Title Pt will average 70dB in simple structured speech tasks over 20 minutes with occassional minimal assistance (12/29/14)   Status Achieved          SLP Long Term Goals - 01/13/15 1013    SLP LONG TERM GOAL #1   Title Demonstrate abdominal breathing for mildly complex conversation for 15 minutes with  occassional minimal cues (01/29/15)   Status Achieved   SLP LONG TERM GOAL #2   Title Pt will maintain an average of 70dBfor modcomplex conversation over 15 minutes with rare minimal assistance over 3 sessions (01/29/15)   Time 3   Period Weeks   Status Revised   SLP LONG TERM GOAL #3   Title Pt will converse audibly in noisy environment for 15 minutes with rare minimal assistance (01/29/15)   Time 3   Period Weeks   Status On-going          Plan - 01/13/15 1011    Clinical Impression Statement Cont ST needed to transfer louder speech to more diverse number of situations/environments outside [place] room.   Speech Therapy Frequency 2x / week   Duration 2 weeks   Treatment/Interventions Compensatory strategies;Functional tasks;Patient/family education;SLP instruction and feedback;Internal/external aids   Potential to Achieve Goals Good    Potential Considerations Severity of impairments        Problem List Patient Active Problem List   Diagnosis Date Noted  . Sinus disease 07/28/2014  . MDD (major depressive disorder), recurrent episode, severe 02/10/2014  . Nonspecific abnormal electrocardiogram (ECG) (EKG) 02/07/2014  . Non-compliant behavior 02/03/2014  . Other abnormal glucose 10/02/2013  . Morton's neuroma of left foot 08/07/2013  . Stress fracture of left foot 07/17/2013  . 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 Major depressive disorder, recurrent episode, severe 03/13/2012  . Hyperglycemia 01/26/2012  . Hypogonadism male 01/24/2012  . Syncope and collapse 01/20/2012  .  OSA (obstructive sleep apnea) 01/17/2011  . CHEST TIGHTNESS 02/18/2008    SCHINKE,CARL, SLP 01/13/2015, 10:14 AM  [facility] [address] [place], [place], [address] Phone: [phone]   Fax:  [phone]

## 2015-01-14 ENCOUNTER — Ambulatory Visit: Payer: Federal, State, Local not specified - PPO | Admitting: Physical Therapy

## 2015-01-14 ENCOUNTER — Ambulatory Visit: Payer: Federal, State, Local not specified - PPO | Admitting: Occupational Therapy

## 2015-01-14 ENCOUNTER — Ambulatory Visit: Payer: Federal, State, Local not specified - PPO

## 2015-01-14 DIAGNOSIS — R269 Unspecified abnormalities of gait and mobility: Secondary | ICD-10-CM

## 2015-01-14 DIAGNOSIS — R293 Abnormal posture: Secondary | ICD-10-CM

## 2015-01-14 DIAGNOSIS — G20 Parkinson's disease: Secondary | ICD-10-CM | POA: Diagnosis not present

## 2015-01-14 DIAGNOSIS — R490 Dysphonia: Secondary | ICD-10-CM

## 2015-01-14 DIAGNOSIS — R258 Other abnormal involuntary movements: Secondary | ICD-10-CM

## 2015-01-14 DIAGNOSIS — R29898 Other symptoms and signs involving the musculoskeletal system: Secondary | ICD-10-CM

## 2015-01-14 DIAGNOSIS — R279 Unspecified lack of coordination: Secondary | ICD-10-CM

## 2015-01-14 NOTE — Therapy (Signed)
[facility] [address] [place], [place], [address] Phone: [phone]   Fax:  [phone]  Occupational Therapy Treatment  Patient Details  Name: [name] [name] MRN: [id number] Date of Birth: 10/17/1950 Referring Provider:  Judie Bonus, MD  Encounter Date: 01/14/2015      OT End of Session - 01/14/15 0940    Visit Number 12   Number of Visits 17   Date for OT Re-Evaluation 01/29/15   Authorization Type BCBS 75 visit limit combined, no auth   OT Start Time [id number]   OT Stop Time 1015   OT Time Calculation (min) 42 min   Activity Tolerance Patient tolerated treatment well   Behavior During Therapy [facility] for tasks assessed/performed      Past Medical History  Diagnosis Date  . Headache(784.0)   . Cardiac murmur     as a child  . Streptococcal meningitis     as an infant  . Depression   .  OSA (obstructive sleep apnea) 01/17/2011    npsg 2012:  AHI 67/hr. Auto titration 2012:  Optimal pressure 12cm.   Marland Kitchen HEADACHES, HX OF 02/18/2008    Qualifier: Diagnosis of  By: Genelle Gather CMA, [place]    . APPENDECTOMY, HX OF 02/18/2008    Qualifier: Diagnosis of  By: Genelle Gather CMA, [place]    . Parkinson disease 11/2014    Past Surgical History  Procedure Laterality Date  . Nasal sinus surgery      x 4 as a child  . Vasectomy    . Appendectomy  1967    There were no vitals filed for this visit.  Visit Diagnosis:  Bradykinesia  Lack of coordination  Rigidity  Abnormal posture      Subjective Assessment - 01/14/15 0934    Subjective  Pt reports he worked some with theraband this a.m.   Pertinent History Diagnosed with Parkinson's approx 1 month ago.   Currently in Pain? No/denies   Multiple Pain Sites No           Treatment: Reviewed theraband HEP issued yesterday, 20 reps each, min v.c. initally then pt returned demonstration. Arm bike x 6 mins level 1 for conditioning. Dynamic step and reach to  toss scarves to target with each UE, min v.c.for large amplitude movements.                 OT Education - 01/13/15 0932    Education provided Yes   Education Details red theraband    Person(s) Educated Patient   Methods Explanation;Demonstration;Handout   Comprehension Verbalized understanding;Returned demonstration;Verbal cues required          OT Short Term Goals - 12/31/14 0852    OT SHORT TERM GOAL #1   Title Pt will be independent with HEP--due 12/30/14   Time 4   Period Weeks   Status Achieved  12/29/14   OT SHORT TERM GOAL #2   Title Pt will verbalize understanding of PD symptoms and ways to prevent future complications.--due 12/30/14   Time 4   Period Weeks   Status Achieved  12/29/14   OT SHORT TERM GOAL #3   Title Pt will verbalize understanding of PD-specific appropriate community resources.--due 12/30/14   Baseline verbalizes understanding of POP and community fitness opportunities   Time 4   Period Weeks   Status Achieved   OT SHORT TERM GOAL #4   Title Pt will complete dressing fasteners mod I using strategies/AE prn.--due 12/30/14  Baseline Pt demonstrates understanding of button hook use, pt continues to report difficulty tying apron/shoes   Time 4   Period Weeks   Status Partially Met   OT SHORT TERM GOAL #5   Title Pt will improve coordination/functional reaching as shown by improving score on box and blocks test by at least 10 with RUE.--due 12/30/14   Baseline 47 blocks on 12/31/14   Time 4   Period Weeks   Status Achieved           OT Long Term Goals - 12/01/14 0903    OT LONG TERM GOAL #1   Title Pt will verbalize understanding of AE/strategies for ADLs/IADLs prn.--due 01/29/15   Time 8   Period Weeks   Status New   OT LONG TERM GOAL #2   Title Pt will improve coordination for ADLs as shown by improving time on 9-hole peg test by at least 5 bilaterally.--due 01/29/15   Baseline R-32.59sec, L-35.66sec   Time 8   Period Weeks    Status New   OT LONG TERM GOAL #3   Title Pt will improve coordination/functional reaching as shown by improving score on box and blocks test by at least 5 with LUE.--due 01/29/15   Baseline 45   Time 8   Period Weeks   Status New   OT LONG TERM GOAL #4   Title Pt will improve coordination/functional reaching as shown by improving score on box and blocks test by at least 10 with RUE.--due 01/29/15   Baseline 37   Time 8   Period Weeks   Status New   OT LONG TERM GOAL #5   Title Pt will improve dressing ability as shown by improving time on PPT#4 by at least 5sec.--due 01/29/15   Baseline 24.53sec   Time 8   Period Weeks   Status New   Long Term Additional Goals   Additional Long Term Goals Yes   OT LONG TERM GOAL #6   Title Pt will improve bradykinesia as shown by improving time on PPT#2 by at least 5sec.--due 01/29/15   Baseline 15.35   Time 8   Period Weeks   Status New               Plan - 01/14/15 0940    Clinical Impression Statement Pt is progressing towards goals with good understanding of updated HEP.   OT Frequency 2x / week   OT Duration 8 weeks   Plan coordination, large amplitude movements   OT Home Exercise Plan Has been issued:  PWR! moves seated, coordination/ PWR! hands HEP, PWR! moves (basic 4) in prone and quadraped (12/29/14) ways to incorporate big movments with ADLS. red theraband issued 01/13/15   Consulted and Agree with Plan of Care Patient        Problem List Patient Active Problem List   Diagnosis Date Noted  . Sinus disease 07/28/2014  . MDD (major depressive disorder), recurrent episode, severe 02/10/2014  . Nonspecific abnormal electrocardiogram (ECG) (EKG) 02/07/2014  . Non-compliant behavior 02/03/2014  . Other abnormal glucose 10/02/2013  . Morton's neuroma of left foot 08/07/2013  . Stress fracture of left foot 07/17/2013  . 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 Major depressive disorder,  recurrent episode, severe 03/13/2012  . Hyperglycemia 01/26/2012  . Hypogonadism male 01/24/2012  . Syncope and collapse 01/20/2012  .  OSA (obstructive sleep apnea) 01/17/2011  . CHEST TIGHTNESS 02/18/2008    Clarke Peretz 01/14/2015, 1:26 PM Theone Murdoch, OTR/L  [phone] Phone: [phone] 1:26 PM 01/14/2015 [facility] [address] [place], [place], [address] Phone: [phone]   Fax:  [phone]

## 2015-01-14 NOTE — Therapy (Signed)
[facility] [address] [place], [place], [address] Phone: [phone]   Fax:  [phone]  Speech Language Pathology Treatment  Patient Details  Name: [name] [name] MRN: [id number] Date of Birth: 06/28/1951 Referring Provider:  Olga Millers, MD  Encounter Date: 01/14/2015      End of Session - 01/14/15 1059    Visit Number 13   Number of Visits 16   Date for SLP Re-Evaluation 01/29/15   SLP Start Time 1017   SLP Stop Time  1059   SLP Time Calculation (min) 42 min   Activity Tolerance Patient tolerated treatment well      Past Medical History  Diagnosis Date  . Headache(784.0)   . Cardiac murmur     as a child  . Streptococcal meningitis     as an infant  . Depression   .  OSA (obstructive sleep apnea) 01/17/2011    npsg 2012:  AHI 67/hr. Auto titration 2012:  Optimal pressure 12cm.   Marland Kitchen HEADACHES, HX OF 02/18/2008    Qualifier: Diagnosis of  By: Danny Lawless CMA, [place]    . APPENDECTOMY, HX OF 02/18/2008    Qualifier: Diagnosis of  By: Danny Lawless CMA, [place]    . Parkinson disease 11/2014    Past Surgical History  Procedure Laterality Date  . Nasal sinus surgery      x 4 as a child  . Vasectomy    . Appendectomy  1967    There were no vitals filed for this visit.  Visit Diagnosis: Hypokinetic Parkinsonian dysphonia      Subjective Assessment - 01/13/15 0942    Subjective Pt went out to eat Saturday at [employer] at 6 pm. Pt did not notice difficulty with anyone hearing/understanding him.               ADULT SLP TREATMENT - 01/14/15 1024    General Information   Behavior/Cognition Alert;Cooperative;Pleasant mood   Treatment Provided   Treatment provided Cognitive-Linquistic   Pain Assessment   Pain Assessment No/denies pain   Cognitive-Linquistic Treatment   Treatment focused on Dysarthria   Skilled Treatment Loud /a/ completed average 84dB with usual min A for vocal quality -  needed to do extended "hey" 50% of the time to feel relaxed larynx and abdominal push. 25 minute mod complex conversation within the clinic - mod complex: pt able to maintain volume at 70dB in a mod noisy environment, and modify louder speech in quieter and louder environments WNL.   Assessment / Recommendations / Plan   Plan Continue with current plan of care   Progression Toward Goals   Progression toward goals Progressing toward goals            SLP Short Term Goals - 01/09/15 0819    SLP SHORT TERM GOAL #1   Title Pt will demonstrate abdominal breathing in structured simple conversation for 10 minutes with occassional minimal cues.12/29/14   Time --   Period --   Status Not Met   SLP SHORT TERM GOAL #2   Title Pt will demonstrate average of 84dB for sustained /a/ over 3 sessions with rare minimal assistance (12/29/14)   Time --   Period --   Status Not Met   SLP SHORT TERM GOAL #3   Title Pt will average 70dB in simple structured speech tasks over 20 minutes with occassional minimal assistance (12/29/14)   Status Achieved          SLP Long Term  Goals - 01/14/15 1100    SLP LONG TERM GOAL #1   Title Demonstrate abdominal breathing for mildly complex conversation for 15 minutes with occassional minimal cues (01/29/15)   Status Achieved   SLP LONG TERM GOAL #2   Title Pt will maintain an average of 70dBfor modcomplex conversation over 15 minutes with rare minimal assistance over 3 sessions (01/29/15)   Time 3   Period Weeks   Status Revised   SLP LONG TERM GOAL #3   Title Pt will converse audibly in noisy environment for 15 minutes with rare minimal assistance (01/29/15)   Status Achieved          Plan - 01/14/15 1059    Clinical Impression Statement Cont ST needed to transfer louder speech to more diverse number of situations/environments outside [facility] room.   Speech Therapy Frequency 2x / week   Duration 2 weeks   Treatment/Interventions Compensatory  strategies;Functional tasks;Patient/family education;SLP instruction and feedback;Internal/external aids   Potential to Achieve Goals Good        Problem List Patient Active Problem List   Diagnosis Date Noted  . Sinus disease 07/28/2014  . MDD (major depressive disorder), recurrent episode, severe 02/10/2014  . Nonspecific abnormal electrocardiogram (ECG) (EKG) 02/07/2014  . Non-compliant behavior 02/03/2014  . Other abnormal glucose 10/02/2013  . Morton's neuroma of left foot 08/07/2013  . Stress fracture of left foot 07/17/2013  . 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 Major depressive disorder, recurrent episode, severe 03/13/2012  . Hyperglycemia 01/26/2012  . Hypogonadism male 01/24/2012  . Syncope and collapse 01/20/2012  .  OSA (obstructive sleep apnea) 01/17/2011  . CHEST TIGHTNESS 02/18/2008    Vergene Marland, SLP 01/14/2015, 11:01 AM  [facility] [address] [place], [place], [address] Phone: [phone]   Fax:  [phone]

## 2015-01-14 NOTE — Therapy (Signed)
[facility] [address] [place], [place], [address] Phone: [phone]   Fax:  [phone]  Physical Therapy Treatment  Patient Details  Name: [name] [name] MRN: [id number] Date of Birth: 08/26/1951 Referring Provider:  Olga Millers, MD  Encounter Date: 01/14/2015      PT End of Session - 01/14/15 1253    Visit Number 13   Number of Visits 17   Date for PT Re-Evaluation 01/30/15   Authorization Type BCBS 75 visit limit combined   PT Start Time 0851   PT Stop Time 0930   PT Time Calculation (min) 39 min   Activity Tolerance Patient tolerated treatment well   Behavior During Therapy [facility] for tasks assessed/performed      Past Medical History  Diagnosis Date  . Headache(784.0)   . Cardiac murmur     as a child  . Streptococcal meningitis     as an infant  . Depression   .  OSA (obstructive sleep apnea) 01/17/2011    npsg 2012:  AHI 67/hr. Auto titration 2012:  Optimal pressure 12cm.   Marland Kitchen HEADACHES, HX OF 02/18/2008    Qualifier: Diagnosis of  By: Danny Lawless CMA, [place]    . APPENDECTOMY, HX OF 02/18/2008    Qualifier: Diagnosis of  By: Danny Lawless CMA, [place]    . Parkinson disease 11/2014    Past Surgical History  Procedure Laterality Date  . Nasal sinus surgery      x 4 as a child  . Vasectomy    . Appendectomy  1967    There were no vitals filed for this visit.  Visit Diagnosis:  Abnormality of gait  Bradykinesia  Rigidity      Subjective Assessment - 01/14/15 0853    Subjective Tired today-no pain   Currently in Pain? No/denies                       [facility] Adult PT Treatment/Exercise - 01/14/15 0856    Ambulation/Gait   Ambulation/Gait Yes   Lumbar Exercises: Aerobic   Stationary Bike SciFit, Level 2.2, 4 extremities x 8 minutes  RPM >90 at times for incr. intensity        Neuro Re-education:  Compliant and unlevel surface work for balance:  Standing on mat  surface on incline/decline with marching in place x 10 reps, forward step taps x 10 reps, feet apart, then feet semi-tandem with head turns, head nods x 10 reps each then eyes closed x 10 seconds.  PT provides supervision and cues for upright posture.  Gait Activities:  Gait activities working on gait coordination-forward/back walking, forward marching with coordinated arm swing, side stepping with coordinated arm swing, sidestep bounding, braiding, at least 2 reps of 25 ft each.  Gait activities indoor and outdoor surfaces using bilateral walking poles x 500 ft with initial cues for sequence.  Pt able to sequence walking poles well on varied surfaces.            PT Short Term Goals - 12/31/14 1025    PT SHORT TERM GOAL #1   Title Pt will be independent with HEP for improved transfers, balance, and gait. (Target 12/30/14)   Status Partially Met   PT SHORT TERM GOAL #2   Title Pt will improve 5x sit<>stand to less than or equal to 11.5 seconds for improved transfer efficiency and safety.   Status Achieved   PT SHORT TERM GOAL #3   Title Pt  will improve Functional Gait Assessment score to at least 18/30 for decreased fall risk.   Status Achieved   PT SHORT TERM GOAL #4   Title Pt will improve TUG score to less than or equal to 13.5 seconds for decreased fall risk.   Status Achieved   PT SHORT TERM GOAL #5   Title Pt will verbalize understanding of local Parkinson's-disease related resources.   Status Achieved           PT Long Term Goals - 12/29/14 1053    PT LONG TERM GOAL #1   Title Pt will verbalize understanding of fall prevention techniques within the home environment. (Target 01/30/15)   Time 8   Period Weeks   Status New   PT LONG TERM GOAL #2   Title Pt will improve gait velocity to at least 2.62 ft/sec for improved gait efficiency and safety.   Time 8   Period Weeks   Status New   PT LONG TERM GOAL #3   Title Pt will improve Functional Gait Assessment to at least  22/30 for decreased fall risk.   Time 8   Period Weeks   Status Achieved  24/30 on 12/29/14   PT LONG TERM GOAL #4   Title Pt will improve TUG cognitive score to less than or equal to 15 seconds for decreased fall risk.   Time 8   Period Weeks   Status New   PT LONG TERM GOAL #5   Title Pt will verbalize plans for continued community fitness upon D/C from PT.   Time 8   Period Weeks   Status New               Plan - 01/14/15 1254    Clinical Impression Statement Pt utilized walking poles with good sequencing on indoor and outdoor surfaces.   Pt will benefit from skilled therapeutic intervention in order to improve on the following deficits Abnormal gait;Decreased activity tolerance;Decreased balance;Decreased endurance;Decreased mobility;Difficulty walking;Decreased strength;Improper body mechanics;Postural dysfunction   Rehab Potential Good   PT Frequency 2x / week   PT Duration 8 weeks   PT Treatment/Interventions ADLs/Self Care Home Management;Gait training;Stair training;Functional mobility training;Therapeutic activities;Neuromuscular re-education;Balance training;Therapeutic exercise;Patient/family education   PT Next Visit Plan gait, large amplitude standing movements and balance activities; treadmill gait, 4-square step/large amplitude step activities, walking poles outdoors/dual task activities with gait   Consulted and Agree with Plan of Care Patient        Problem List Patient Active Problem List   Diagnosis Date Noted  . Sinus disease 07/28/2014  . MDD (major depressive disorder), recurrent episode, severe 02/10/2014  . Nonspecific abnormal electrocardiogram (ECG) (EKG) 02/07/2014  . Non-compliant behavior 02/03/2014  . Other abnormal glucose 10/02/2013  . Morton's neuroma of left foot 08/07/2013  . Stress fracture of left foot 07/17/2013  . 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 Major depressive disorder, recurrent  episode, severe 03/13/2012  . Hyperglycemia 01/26/2012  . Hypogonadism male 01/24/2012  . Syncope and collapse 01/20/2012  .  OSA (obstructive sleep apnea) 01/17/2011  . CHEST TIGHTNESS 02/18/2008    Zerah Hilyer W. 01/14/2015, 12:56 PM  Mady Haagensen, PT 01/14/2015 12:57 PM Phone: [phone] Fax: [facility] [address] [place], [place], [address] Phone: [phone]   Fax:  [phone]

## 2015-01-14 NOTE — Patient Instructions (Signed)
Use the table I provided to track conversations.

## 2015-01-19 ENCOUNTER — Encounter: Payer: Self-pay | Admitting: Occupational Therapy

## 2015-01-20 ENCOUNTER — Ambulatory Visit: Payer: Federal, State, Local not specified - PPO | Admitting: Physical Therapy

## 2015-01-20 ENCOUNTER — Ambulatory Visit: Payer: Federal, State, Local not specified - PPO

## 2015-01-20 ENCOUNTER — Ambulatory Visit: Payer: Federal, State, Local not specified - PPO | Admitting: Occupational Therapy

## 2015-01-20 DIAGNOSIS — R279 Unspecified lack of coordination: Secondary | ICD-10-CM

## 2015-01-20 DIAGNOSIS — G20 Parkinson's disease: Secondary | ICD-10-CM | POA: Diagnosis not present

## 2015-01-20 DIAGNOSIS — R29898 Other symptoms and signs involving the musculoskeletal system: Secondary | ICD-10-CM

## 2015-01-20 DIAGNOSIS — R258 Other abnormal involuntary movements: Secondary | ICD-10-CM

## 2015-01-20 DIAGNOSIS — R490 Dysphonia: Secondary | ICD-10-CM

## 2015-01-20 DIAGNOSIS — R269 Unspecified abnormalities of gait and mobility: Secondary | ICD-10-CM

## 2015-01-20 NOTE — Therapy (Deleted)
[facility] [address] [place], [place], [address] Phone: [phone]   Fax:  [phone]  Occupational Therapy Treatment  Patient Details  Name: [name] [name] MRN: [id number] Date of Birth: [birth year] Referring Provider:  Olga Millers, MD  Encounter Date: 01/20/2015      OT End of Session - 01/20/15 0923    Visit Number 13   Number of Visits 17   Date for OT Re-Evaluation 01/29/15   Authorization Type BCBS 75 visit limit combined, no auth   OT Start Time 0848   OT Stop Time 0930   OT Time Calculation (min) 42 min   Activity Tolerance Patient tolerated treatment well   Behavior During Therapy [facility] for tasks assessed/performed      Past Medical History  Diagnosis Date  . Headache(784.0)   . Cardiac murmur     as a child  . Streptococcal meningitis     as an infant  . Depression   .  OSA (obstructive sleep apnea) 01/17/2011    npsg 2012:  AHI 67/hr. Auto titration 2012:  Optimal pressure 12cm.   Marland Kitchen HEADACHES, HX OF 02/18/2008    Qualifier: Diagnosis of  By: Danny Lawless CMA, [place]    . APPENDECTOMY, HX OF 02/18/2008    Qualifier: Diagnosis of  By: Danny Lawless CMA, [place]    . Parkinson disease 11/2014    Past Surgical History  Procedure Laterality Date  . Nasal sinus surgery      x 4 as a child  . Vasectomy    . Appendectomy  1967    There were no vitals filed for this visit.  Visit Diagnosis:  Bradykinesia  Rigidity  Lack of coordination      Subjective Assessment - 01/20/15 0927    Pertinent History Diagnosed with Parkinson's approx 1 month ago.   Currently in Pain? Yes   Pain Score 1    Pain Location Back   Pain Descriptors / Indicators Aching   Pain Onset In the past 7 days   Aggravating Factors  malpositioning, carrying boxes   Pain Relieving Factors repositioning   Effect of Pain on Daily Activities limits activity   Multiple Pain Sites No                          PWR [facility]) - 01/20/15 0931    PWR! exercises Moves in [place]! Up 10   PWR! Rock 10   PWR! Twist 10   PWR! Step 10   Comments performed mocdified over tabletop, min v.c.               OT Short Term Goals - 12/31/14 0852    OT SHORT TERM GOAL #1   Title Pt will be independent with HEP--due 12/30/14   Time 4   Period Weeks   Status Achieved  12/29/14   OT SHORT TERM GOAL #2   Title Pt will verbalize understanding of PD symptoms and ways to prevent future complications.--due 12/30/14   Time 4   Period Weeks   Status Achieved  12/29/14   OT SHORT TERM GOAL #3   Title Pt will verbalize understanding of PD-specific appropriate community resources.--due 12/30/14   Baseline verbalizes understanding of POP and community fitness opportunities   Time 4   Period Weeks   Status Achieved   OT SHORT TERM GOAL #4   Title Pt will complete dressing fasteners mod I using strategies/AE prn.--due  12/30/14   Baseline Pt demonstrates understanding of button hook use, pt continues to report difficulty tying apron/shoes   Time 4   Period Weeks   Status Partially Met   OT SHORT TERM GOAL #5   Title Pt will improve coordination/functional reaching as shown by improving score on box and blocks test by at least 10 with RUE.--due 12/30/14   Baseline 47 blocks on 12/31/14   Time 4   Period Weeks   Status Achieved           OT Long Term Goals - 12/01/14 0903    OT LONG TERM GOAL #1   Title Pt will verbalize understanding of AE/strategies for ADLs/IADLs prn.--due 01/29/15   Time 8   Period Weeks   Status New   OT LONG TERM GOAL #2   Title Pt will improve coordination for ADLs as shown by improving time on 9-hole peg test by at least 5 bilaterally.--due 01/29/15   Baseline R-32.59sec, L-35.66sec   Time 8   Period Weeks   Status New   OT LONG TERM GOAL #3   Title Pt will improve coordination/functional reaching as shown by improving score on box and blocks test by at least 5 with  LUE.--due 01/29/15   Baseline 45   Time 8   Period Weeks   Status New   OT LONG TERM GOAL #4   Title Pt will improve coordination/functional reaching as shown by improving score on box and blocks test by at least 10 with RUE.--due 01/29/15   Baseline 37   Time 8   Period Weeks   Status New   OT LONG TERM GOAL #5   Title Pt will improve dressing ability as shown by improving time on PPT#4 by at least 5sec.--due 01/29/15   Baseline 24.53sec   Time 8   Period Weeks   Status New   Long Term Additional Goals   Additional Long Term Goals Yes   OT LONG TERM GOAL #6   Title Pt will improve bradykinesia as shown by improving time on PPT#2 by at least 5sec.--due 01/29/15   Baseline 15.35   Time 8   Period Weeks   Status New               Problem List Patient Active Problem List   Diagnosis Date Noted  . Sinus disease 07/28/2014  . MDD (major depressive disorder), recurrent episode, severe 02/10/2014  . Nonspecific abnormal electrocardiogram (ECG) (EKG) 02/07/2014  . Non-compliant behavior 02/03/2014  . Other abnormal glucose 10/02/2013  . Morton's neuroma of left foot 08/07/2013  . Stress fracture of left foot 07/17/2013  . 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 Major depressive disorder, recurrent episode, severe 03/13/2012  . Hyperglycemia 01/26/2012  . Hypogonadism male 01/24/2012  . Syncope and collapse 01/20/2012  .  OSA (obstructive sleep apnea) 01/17/2011  . CHEST TIGHTNESS 02/18/2008    RINE,KATHRYN 01/20/2015, 9:56 AM  [facility] [address] [place], [place], [address] Phone: [phone]   Fax:  [phone]

## 2015-01-20 NOTE — Therapy (Signed)
[facility] [address] [place], [place], [address] Phone: [phone]   Fax:  [phone]  Speech Language Pathology Treatment  Patient Details  Name: [name] [name] MRN: [id number] Date of Birth: 10/06/1950 Referring Provider:  Olga Millers, MD  Encounter Date: 01/20/2015      End of Session - 01/20/15 0850    Visit Number 14   Number of Visits 16   Date for SLP Re-Evaluation 01/29/15   SLP Start Time 0807   SLP Stop Time  0848   SLP Time Calculation (min) 41 min   Activity Tolerance Patient tolerated treatment well      Past Medical History  Diagnosis Date  . Headache(784.0)   . Cardiac murmur     as a child  . Streptococcal meningitis     as an infant  . Depression   .  OSA (obstructive sleep apnea) 01/17/2011    npsg 2012:  AHI 67/hr. Auto titration 2012:  Optimal pressure 12cm.   Marland Kitchen HEADACHES, HX OF 02/18/2008    Qualifier: Diagnosis of  By: Danny Lawless CMA, [place]    . APPENDECTOMY, HX OF 02/18/2008    Qualifier: Diagnosis of  By: Danny Lawless CMA, [place]    . Parkinson disease 11/2014    Past Surgical History  Procedure Laterality Date  . Nasal sinus surgery      x 4 as a child  . Vasectomy    . Appendectomy  1967    There were no vitals filed for this visit.  Visit Diagnosis: Hypokinetic Parkinsonian dysphonia      Subjective Assessment - 01/20/15 0813    Subjective "Been up and down" (pt, re: voice)               ADULT SLP TREATMENT - 01/20/15 0813    General Information   Behavior/Cognition Alert;Cooperative;Pleasant mood   Treatment Provided   Treatment provided Cognitive-Linquistic   Pain Assessment   Pain Assessment No/denies pain   Cognitive-Linquistic Treatment   Treatment focused on Dysarthria   Skilled Treatment Pt has not been performing loud /a/ as directed - once a day.  Average loud /a/ today 85dB with rare min A for breath support. 20 minutes of conversation  today with average 70dB and occasional min A. Outside therapy room pt maintained adequate loudness.    Assessment / Recommendations / Plan   Plan Continue with current plan of care   Progression Toward Goals   Progression toward goals Progressing toward goals            SLP Short Term Goals - 01/09/15 0819    SLP SHORT TERM GOAL #1   Title Pt will demonstrate abdominal breathing in structured simple conversation for 10 minutes with occassional minimal cues.12/29/14   Time --   Period --   Status Not Met   SLP SHORT TERM GOAL #2   Title Pt will demonstrate average of 84dB for sustained /a/ over 3 sessions with rare minimal assistance (12/29/14)   Time --   Period --   Status Not Met   SLP SHORT TERM GOAL #3   Title Pt will average 70dB in simple structured speech tasks over 20 minutes with occassional minimal assistance (12/29/14)   Status Achieved          SLP Long Term Goals - 01/20/15 7564    SLP LONG TERM GOAL #1   Title Demonstrate abdominal breathing for mildly complex conversation for 15 minutes with occassional minimal cues (  01/29/15)   Status Achieved   SLP LONG TERM GOAL #2   Title Pt will maintain an average of 70dBfor modcomplex conversation over 15 minutes with rare minimal assistance over 3 sessions (01/29/15)   Time 2   Period Weeks   Status Revised   SLP LONG TERM GOAL #3   Title Pt will converse audibly in noisy environment for 15 minutes with rare minimal assistance (01/29/15)   Status Achieved          Plan - 01/20/15 0851    Clinical Impression Statement Cont ST needed to transfer louder speech to more diverse number of situations/environments outside [place] room.   Speech Therapy Frequency 2x / week   Duration 2 weeks   Treatment/Interventions Compensatory strategies;Functional tasks;Patient/family education;SLP instruction and feedback;Internal/external aids   Potential to Achieve Goals Good   Potential Considerations Severity of impairments    Consulted and Agree with Plan of Care Patient        Problem List Patient Active Problem List   Diagnosis Date Noted  . Sinus disease 07/28/2014  . MDD (major depressive disorder), recurrent episode, severe 02/10/2014  . Nonspecific abnormal electrocardiogram (ECG) (EKG) 02/07/2014  . Non-compliant behavior 02/03/2014  . Other abnormal glucose 10/02/2013  . Morton's neuroma of left foot 08/07/2013  . Stress fracture of left foot 07/17/2013  . 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 Major depressive disorder, recurrent episode, severe 03/13/2012  . Hyperglycemia 01/26/2012  . Hypogonadism male 01/24/2012  . Syncope and collapse 01/20/2012  .  OSA (obstructive sleep apnea) 01/17/2011  . CHEST TIGHTNESS 02/18/2008    [facility] ,SLP 01/20/2015, 8:52 AM  [facility] [address] [place], [place], [address] Phone: [phone]   Fax:  [phone]

## 2015-01-20 NOTE — Therapy (Addendum)
[facility] [address] [place], [place], [address] Phone: [phone]   Fax:  [phone]  Occupational Therapy Treatment  Patient Details  Name: [name] [name] MRN: [id number] Date of Birth: 11-25-1950 Referring Provider:  Olga Millers, MD  Encounter Date: 01/20/2015      OT End of Session - 01/20/15 0923    Visit Number 13   Number of Visits 17   Date for OT Re-Evaluation 01/29/15   Authorization Type BCBS 75 visit limit combined, no auth   OT Start Time 0848   OT Stop Time 0930   OT Time Calculation (min) 42 min   Activity Tolerance Patient tolerated treatment well   Behavior During Therapy [facility] for tasks assessed/performed      Past Medical History  Diagnosis Date  . Headache(784.0)   . Cardiac murmur     as a child  . Streptococcal meningitis     as an infant  . Depression   .  OSA (obstructive sleep apnea) 01/17/2011    npsg 2012:  AHI 67/hr. Auto titration 2012:  Optimal pressure 12cm.   Marland Kitchen HEADACHES, HX OF 02/18/2008    Qualifier: Diagnosis of  By: Danny Lawless CMA, [place]    . APPENDECTOMY, HX OF 02/18/2008    Qualifier: Diagnosis of  By: Danny Lawless CMA, [place]    . Parkinson disease 11/2014    Past Surgical History  Procedure Laterality Date  . Nasal sinus surgery      x 4 as a child  . Vasectomy    . Appendectomy  1967    There were no vitals filed for this visit.  Visit Diagnosis:  Bradykinesia  Rigidity  Lack of coordination      Subjective Assessment - 01/20/15 0927    Pertinent History Diagnosed with Parkinson's approx 1 month ago.   Currently in Pain? Yes   Pain Score 1    Pain Location Back   Pain Descriptors / Indicators Aching   Pain Onset In the past 7 days   Aggravating Factors  malpositioning, carrying boxes   Pain Relieving Factors repositioning   Effect of Pain on Daily Activities limits activity   Multiple Pain Sites No        Dynamic step/ reach with  PWR hands to place graded clothespins on vertical antennae, min v.c. for large amplitude movements Arm bike x 6 mins level 1 for conditioning. Placing [place] pegs in pegboard with LUE, PWR hands prior to task.                 P[facility]) - 01/20/15 0931    PWR! exercises Moves in [place]! Up 10   PWR! Rock 10   PWR! Twist 10   PWR! Step 10   Comments performed modified over tabletop, min v.c.               OT Short Term Goals - 12/31/14 0852    OT SHORT TERM GOAL #1   Title Pt will be independent with HEP--due 12/30/14   Time 4   Period Weeks   Status Achieved  12/29/14   OT SHORT TERM GOAL #2   Title Pt will verbalize understanding of PD symptoms and ways to prevent future complications.--due 12/30/14   Time 4   Period Weeks   Status Achieved  12/29/14   OT SHORT TERM GOAL #3   Title Pt will verbalize understanding of PD-specific appropriate community resources.--due 12/30/14   Baseline verbalizes  understanding of POP and community fitness opportunities   Time 4   Period Weeks   Status Achieved   OT SHORT TERM GOAL #4   Title Pt will complete dressing fasteners mod I using strategies/AE prn.--due 12/30/14   Baseline Pt demonstrates understanding of button hook use, pt continues to report difficulty tying apron/shoes   Time 4   Period Weeks   Status Partially Met   OT SHORT TERM GOAL #5   Title Pt will improve coordination/functional reaching as shown by improving score on box and blocks test by at least 10 with RUE.--due 12/30/14   Baseline 47 blocks on 12/31/14   Time 4   Period Weeks   Status Achieved           OT Long Term Goals - 12/01/14 0903    OT LONG TERM GOAL #1   Title Pt will verbalize understanding of AE/strategies for ADLs/IADLs prn.--due 01/29/15   Time 8   Period Weeks   Status New   OT LONG TERM GOAL #2   Title Pt will improve coordination for ADLs as shown by improving time on 9-hole peg test by at least 5  bilaterally.--due 01/29/15   Baseline R-32.59sec, L-35.66sec   Time 8   Period Weeks   Status New   OT LONG TERM GOAL #3   Title Pt will improve coordination/functional reaching as shown by improving score on box and blocks test by at least 5 with LUE.--due 01/29/15   Baseline 45   Time 8   Period Weeks   Status New   OT LONG TERM GOAL #4   Title Pt will improve coordination/functional reaching as shown by improving score on box and blocks test by at least 10 with RUE.--due 01/29/15   Baseline 37   Time 8   Period Weeks   Status New   OT LONG TERM GOAL #5   Title Pt will improve dressing ability as shown by improving time on PPT#4 by at least 5sec.--due 01/29/15   Baseline 24.53sec   Time 8   Period Weeks   Status New   Long Term Additional Goals   Additional Long Term Goals Yes   OT LONG TERM GOAL #6   Title Pt will improve bradykinesia as shown by improving time on PPT#2 by at least 5sec.--due 01/29/15   Baseline 15.35   Time 8   Period Weeks   Status New               Plan - 01/20/15 1009    Clinical Impression Statement Pt is progressing towards goals with increased overall awarenss of LUE limitations.   Plan coordination, large amplitude movments, anticipate d/c next week   OT Home Exercise Plan Has been issued:  PWR! moves seated, coordination/ PWR! hands HEP, PWR! moves (basic 4) in prone and quadraped (12/29/14) ways to incorporate big movments with ADLS. red theraband issued 01/13/15   Consulted and Agree with Plan of Care Patient        Problem List Patient Active Problem List   Diagnosis Date Noted  . Sinus disease 07/28/2014  . MDD (major depressive disorder), recurrent episode, severe 02/10/2014  . Nonspecific abnormal electrocardiogram (ECG) (EKG) 02/07/2014  . Non-compliant behavior 02/03/2014  . Other abnormal glucose 10/02/2013  . Morton's neuroma of left foot 08/07/2013  . Stress fracture of left foot 07/17/2013  . Attention deficit disorder  without mention of hyperactivity 03/13/2012  . Major depressive disorder, recurrent episode, severe 03/13/2012  . Hyperglycemia 01/26/2012  .  Hypogonadism male 01/24/2012  . Syncope and collapse 01/20/2012  .  OSA (obstructive sleep apnea) 01/17/2011  . CHEST TIGHTNESS 02/18/2008    RINE,KATHRYN 01/20/2015, 10:11 AM Theone Murdoch, OTR/L [phone] Phone: [phone] 10:11 AM 01/20/2015 [facility] [address] [place], [place], [address] Phone: [phone]   Fax:  [phone]

## 2015-01-20 NOTE — Therapy (Signed)
[facility] [address] [place], [place], [address] Phone: [phone]   Fax:  [phone]  Physical Therapy Treatment  Patient Details  Name: [name] [name] MRN: [id number] Date of Birth: 02-Sep-1951 Referring Provider:  Olga Millers, MD  Encounter Date: 01/20/2015      PT End of Session - 01/21/15 2205    Visit Number 14   Number of Visits 17   Date for PT Re-Evaluation 01/30/15   Authorization Type BCBS 75 visit limit combined   PT Start Time 0936   PT Stop Time 1015   PT Time Calculation (min) 39 min   Activity Tolerance Patient tolerated treatment well   Behavior During Therapy [facility] for tasks assessed/performed      Past Medical History  Diagnosis Date  . Headache(784.0)   . Cardiac murmur     as a child  . Streptococcal meningitis     as an infant  . Depression   .  OSA (obstructive sleep apnea) 01/17/2011    npsg 2012:  AHI 67/hr. Auto titration 2012:  Optimal pressure 12cm.   Marland Kitchen HEADACHES, HX OF 02/18/2008    Qualifier: Diagnosis of  By: Danny Lawless CMA, [place]    . APPENDECTOMY, HX OF 02/18/2008    Qualifier: Diagnosis of  By: Danny Lawless CMA, [place]    . Parkinson disease 11/2014    Past Surgical History  Procedure Laterality Date  . Nasal sinus surgery      x 4 as a child  . Vasectomy    . Appendectomy  1967    There were no vitals filed for this visit.  Visit Diagnosis:  Bradykinesia  Rigidity  Abnormality of gait      Subjective Assessment - 01/21/15 2155    Subjective Nothing new        On ramp: Pt stands on solid surface on incline/decline-Marching x 10 reps, forward step taps x 10 reps.  Standing with feet apart,  head turns x  10 reps, head nods x 10 reps, with eyes open.  PT provides supervision for balance.   Forward step ups- double leg, then single leg; forward step downs on 6 inch aerobic step, simulating carrying objects on attic steps.   Cues given for increased  deliberate movement to activate through lower extremity muscles for improved single leg balance.   Pt carries various weighted boxes 4 steps x 4 reps with cues for deliberate foot placement.    Gait negotiating stepping over hurdles for improved step height and step length.  Discussed pt using various strategies to remember large amplitude movements for improved foot clearance during times when he has difficulty with foot clearance and shuffling steps.                           PT Short Term Goals - 12/31/14 1025    PT SHORT TERM GOAL #1   Title Pt will be independent with HEP for improved transfers, balance, and gait. (Target 12/30/14)   Status Partially Met   PT SHORT TERM GOAL #2   Title Pt will improve 5x sit<>stand to less than or equal to 11.5 seconds for improved transfer efficiency and safety.   Status Achieved   PT SHORT TERM GOAL #3   Title Pt will improve Functional Gait Assessment score to at least 18/30 for decreased fall risk.   Status Achieved   PT SHORT TERM GOAL #4   Title Pt will  improve TUG score to less than or equal to 13.5 seconds for decreased fall risk.   Status Achieved   PT SHORT TERM GOAL #5   Title Pt will verbalize understanding of local Parkinson's-disease related resources.   Status Achieved           PT Long Term Goals - 12/29/14 1053    PT LONG TERM GOAL #1   Title Pt will verbalize understanding of fall prevention techniques within the home environment. (Target 01/30/15)   Time 8   Period Weeks   Status New   PT LONG TERM GOAL #2   Title Pt will improve gait velocity to at least 2.62 ft/sec for improved gait efficiency and safety.   Time 8   Period Weeks   Status New   PT LONG TERM GOAL #3   Title Pt will improve Functional Gait Assessment to at least 22/30 for decreased fall risk.   Time 8   Period Weeks   Status Achieved  24/30 on 12/29/14   PT LONG TERM GOAL #4   Title Pt will improve TUG cognitive score to less than  or equal to 15 seconds for decreased fall risk.   Time 8   Period Weeks   Status New   PT LONG TERM GOAL #5   Title Pt will verbalize plans for continued community fitness upon D/C from PT.   Time 8   Period Weeks   Status New               Plan - 01/21/15 2206    Clinical Impression Statement Pt experiences several freezing episodes during activities simulating carrying objects on steps, needing tactile cues for weightshifting.  Pt will continue to benefit from further skilled PT to work on functional mobility and balance.   Pt will benefit from skilled therapeutic intervention in order to improve on the following deficits Abnormal gait;Decreased activity tolerance;Decreased balance;Decreased endurance;Decreased mobility;Difficulty walking;Decreased strength;Improper body mechanics;Postural dysfunction   Rehab Potential Good   PT Frequency 2x / week   PT Duration 8 weeks   PT Treatment/Interventions ADLs/Self Care Home Management;Gait training;Stair training;Functional mobility training;Therapeutic activities;Neuromuscular re-education;Balance training;Therapeutic exercise;Patient/family education   PT Next Visit Plan gait, large amplitude standing movements and balance activities; walking poles on outdoor surfaces; plan for discharge next week   Consulted and Agree with Plan of Care Patient        Problem List Patient Active Problem List   Diagnosis Date Noted  . Sinus disease 07/28/2014  . MDD (major depressive disorder), recurrent episode, severe 02/10/2014  . Nonspecific abnormal electrocardiogram (ECG) (EKG) 02/07/2014  . Non-compliant behavior 02/03/2014  . Other abnormal glucose 10/02/2013  . Morton's neuroma of left foot 08/07/2013  . Stress fracture of left foot 07/17/2013  . 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 Major depressive disorder, recurrent episode, severe 03/13/2012  . Hyperglycemia 01/26/2012  . Hypogonadism male  01/24/2012  . Syncope and collapse 01/20/2012  .  OSA (obstructive sleep apnea) 01/17/2011  . CHEST TIGHTNESS 02/18/2008    MARRIOTT,AMY W. 01/21/2015, 10:09 PM  Mady Haagensen, PT 01/21/2015 10:10 PM Phone: [phone] Fax: [facility] [address] [place], [place], [address] Phone: [phone]   Fax:  [phone]

## 2015-01-21 ENCOUNTER — Ambulatory Visit (HOSPITAL_COMMUNITY): Payer: Self-pay | Admitting: Psychiatry

## 2015-01-22 ENCOUNTER — Ambulatory Visit: Payer: Federal, State, Local not specified - PPO | Admitting: Speech Pathology

## 2015-01-22 ENCOUNTER — Encounter: Payer: Self-pay | Admitting: Occupational Therapy

## 2015-01-22 ENCOUNTER — Ambulatory Visit: Payer: Federal, State, Local not specified - PPO | Admitting: Occupational Therapy

## 2015-01-22 ENCOUNTER — Ambulatory Visit: Payer: Federal, State, Local not specified - PPO

## 2015-01-22 DIAGNOSIS — Z7409 Other reduced mobility: Secondary | ICD-10-CM

## 2015-01-22 DIAGNOSIS — R29898 Other symptoms and signs involving the musculoskeletal system: Secondary | ICD-10-CM

## 2015-01-22 DIAGNOSIS — R279 Unspecified lack of coordination: Secondary | ICD-10-CM

## 2015-01-22 DIAGNOSIS — R293 Abnormal posture: Secondary | ICD-10-CM

## 2015-01-22 DIAGNOSIS — G20 Parkinson's disease: Secondary | ICD-10-CM | POA: Diagnosis not present

## 2015-01-22 DIAGNOSIS — R258 Other abnormal involuntary movements: Secondary | ICD-10-CM

## 2015-01-22 DIAGNOSIS — R490 Dysphonia: Secondary | ICD-10-CM

## 2015-01-22 DIAGNOSIS — R269 Unspecified abnormalities of gait and mobility: Secondary | ICD-10-CM

## 2015-01-22 NOTE — Patient Instructions (Signed)
Continue Hey - Ah,  Practice focused conversations with loud voice

## 2015-01-22 NOTE — Therapy (Signed)
[facility] [address] [place], [place], [address] Phone: [phone]   Fax:  [phone]  Physical Therapy Treatment  Patient Details  Name: [name] [name] MRN: [id number] Date of Birth: Sep 24, 1951 Referring Provider:  Judie Bonus, MD  Encounter Date: 01/22/2015      PT End of Session - 01/22/15 1140    Visit Number 15   Number of Visits 17   Date for PT Re-Evaluation 01/30/15   Authorization Type BCBS 75 visit limit combined   PT Start Time 0850   PT Stop Time 0930   PT Time Calculation (min) 40 min      Past Medical History  Diagnosis Date  . Headache(784.0)   . Cardiac murmur     as a child  . Streptococcal meningitis     as an infant  . Depression   .  OSA (obstructive sleep apnea) 01/17/2011    npsg 2012:  AHI 67/hr. Auto titration 2012:  Optimal pressure 12cm.   Marland Kitchen HEADACHES, HX OF 02/18/2008    Qualifier: Diagnosis of  By: Genelle Gather CMA, [place]    . APPENDECTOMY, HX OF 02/18/2008    Qualifier: Diagnosis of  By: Genelle Gather CMA, [place]    . Parkinson disease 11/2014    Past Surgical History  Procedure Laterality Date  . Nasal sinus surgery      x 4 as a child  . Vasectomy    . Appendectomy  1967    There were no vitals filed for this visit.  Visit Diagnosis:  Lack of coordination  Abnormality of gait      Subjective Assessment - 01/22/15 0852    Subjective (p) I'm shuffling more than I want to and I try to focus on this, but after 8 hours at work it's hard to focus on this   Currently in Pain? (p) No/denies     Gait training: Indoor level surfaces with poor reciprocal arm swing, then trialed use of walking poles with continued poor arm swing, then utilized poles with therapist holding back of poles to facilitate arm swing with pt responding well to this. Then trailed ambulating again on indoor, level surfaces with pt utilizing poles again and requiring verbal cues and counting  steps 1-2-1-2 to proplerly coordinate reciprocal movement with poles and steps. Progressed to ambulate outside on uneven concrete and grass and curbs >2000' with no signs of imbalance and with pt able to maintain sequencing and coordination of steps and poles.  Balance training Single limb stance with ball toss  Standing feet apart on compliant airex foam with ball toss with lateral weight shifting for "around the world toss" then with overhead toss for improved posture and larger amplitude movement.                       [facility]) - 01/22/15 0800    PWR! exercises Moves in [place];Moves in prone   PWR! Up 10   PWR! Rock 10    PWR! Twist 10 to each side   PWR! Step 10 to each side   Comments min v.c.   PWR! Up 20   PWR! Rock 10 ea side   PWR! Twist 10 ea side   PWR! Step 10 each side   Comments min v.c.                PT Short Term Goals - 12/31/14 1025    PT SHORT TERM GOAL #1  Title Pt will be independent with HEP for improved transfers, balance, and gait. (Target 12/30/14)   Status Partially Met   PT SHORT TERM GOAL #2   Title Pt will improve 5x sit<>stand to less than or equal to 11.5 seconds for improved transfer efficiency and safety.   Status Achieved   PT SHORT TERM GOAL #3   Title Pt will improve Functional Gait Assessment score to at least 18/30 for decreased fall risk.   Status Achieved   PT SHORT TERM GOAL #4   Title Pt will improve TUG score to less than or equal to 13.5 seconds for decreased fall risk.   Status Achieved   PT SHORT TERM GOAL #5   Title Pt will verbalize understanding of local Parkinson's-disease related resources.   Status Achieved           PT Long Term Goals - 12/29/14 1053    PT LONG TERM GOAL #1   Title Pt will verbalize understanding of fall prevention techniques within the home environment. (Target 01/30/15)   Time 8   Period Weeks   Status New   PT LONG TERM GOAL #2   Title Pt will improve gait velocity  to at least 2.62 ft/sec for improved gait efficiency and safety.   Time 8   Period Weeks   Status New   PT LONG TERM GOAL #3   Title Pt will improve Functional Gait Assessment to at least 22/30 for decreased fall risk.   Time 8   Period Weeks   Status Achieved  24/30 on 12/29/14   PT LONG TERM GOAL #4   Title Pt will improve TUG cognitive score to less than or equal to 15 seconds for decreased fall risk.   Time 8   Period Weeks   Status New   PT LONG TERM GOAL #5   Title Pt will verbalize plans for continued community fitness upon D/C from PT.   Time 8   Period Weeks   Status New               Plan - 01/22/15 1140    Clinical Impression Statement Pt did not have any freezing episodes today however we were not working on carrying objects today. Initially pt had notable difficulty coordinating reciprocal arm swing with and without walking poles but with practice demonstrated good coordination of this on outdoor, grassy surfaces without evidence of balance impairment during this task.   PT Next Visit Plan begin checking long term goals. Continue to simulate work environment and carrying objects/boxes        Problem List Patient Active Problem List   Diagnosis Date Noted  . Sinus disease 07/28/2014  . MDD (major depressive disorder), recurrent episode, severe 02/10/2014  . Nonspecific abnormal electrocardiogram (ECG) (EKG) 02/07/2014  . Non-compliant behavior 02/03/2014  . Other abnormal glucose 10/02/2013  . Morton's neuroma of left foot 08/07/2013  . Stress fracture of left foot 07/17/2013  . 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 Major depressive disorder, recurrent episode, severe 03/13/2012  . Hyperglycemia 01/26/2012  . Hypogonadism male 01/24/2012  . Syncope and collapse 01/20/2012  .  OSA (obstructive sleep apnea) 01/17/2011  . CHEST TIGHTNESS 02/18/2008    Delrae Sawyers, PT,DPT,NCS 01/22/2015 12:03 PM Phone  [phone] FAX [phone]         [facility] [address] [place], [place], [address] Phone: [phone]   Fax:  [phone]

## 2015-01-22 NOTE — Therapy (Signed)
[facility] [address] [place], [place], [address] Phone: [phone]   Fax:  [phone]  Speech Language Pathology Treatment  Patient Details  Name: [name] [name] MRN: [id number] Date of Birth: [birth year] Referring Provider:  Olga Millers, MD  Encounter Date: 01/22/2015      End of Session - 01/22/15 1014    SLP Stop Time  1013      Past Medical History  Diagnosis Date  . Headache(784.0)   . Cardiac murmur     as a child  . Streptococcal meningitis     as an infant  . Depression   .  OSA (obstructive sleep apnea) 01/17/2011    npsg 2012:  AHI 67/hr. Auto titration 2012:  Optimal pressure 12cm.   Marland Kitchen HEADACHES, HX OF 02/18/2008    Qualifier: Diagnosis of  By: Danny Lawless CMA, [place]    . APPENDECTOMY, HX OF 02/18/2008    Qualifier: Diagnosis of  By: Danny Lawless CMA, [place]    . Parkinson disease 11/2014    Past Surgical History  Procedure Laterality Date  . Nasal sinus surgery      x 4 as a child  . Vasectomy    . Appendectomy  1967    There were no vitals filed for this visit.  Visit Diagnosis: Hypokinetic Parkinsonian dysphonia      Subjective Assessment - 01/22/15 0933    Subjective "It's been going good"   Currently in Pain? 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 Loudness also facilitated with cues for more frequent and deep  breaths. Conversation  over 12 minutes  with average of  72dB with occassoinal min A.     Assessment / Recommendations / Plan   Plan Continue with current plan of care   Progression Toward Goals   Progression toward goals Progressing toward goals          SLP Education - 01/22/15 1007    Education provided Yes   Person(s) Educated Patient          SLP Short Term Goals - 01/09/15 0819    SLP SHORT TERM GOAL #1   Title Pt will demonstrate abdominal breathing in structured simple conversation for 10 minutes with occassional minimal cues.12/29/14   Time --   Period --   Status Not Met   SLP SHORT TERM GOAL #2   Title Pt will demonstrate average of 84dB for sustained /a/ over 3 sessions with rare minimal assistance (12/29/14)   Time --   Period --   Status Not Met   SLP SHORT TERM GOAL #3   Title Pt will average 70dB in simple structured speech tasks over 20 minutes with occassional minimal assistance (12/29/14)   Status Achieved          SLP Long Term Goals - 01/22/15 1010    SLP LONG TERM GOAL #1   Title Demonstrate abdominal breathing for mildly complex conversation for 15 minutes with occassional minimal cues (01/29/15)   Status Achieved   SLP LONG TERM GOAL #  2   Title Pt will maintain an average of 70dBfor modcomplex conversation over 15 minutes with rare minimal assistance over 3 sessions (01/29/15)   Time 2   Period Weeks   Status Revised   SLP LONG TERM GOAL #3   Title Pt will converse audibly in noisy environment for 15 minutes with rare minimal assistance (01/29/15)   Status Achieved          Plan - 01/22/15 1008    Clinical Impression Statement Cont ST needed to transfer louder speech to more diverse number of situations/environments outside [facility] room.   Speech Therapy Frequency 2x / week   Duration 2 weeks   Treatment/Interventions Compensatory strategies;Functional tasks;Patient/family education;SLP instruction and feedback;Internal/external aids        Problem List Patient Active Problem List   Diagnosis Date Noted  . Sinus disease 07/28/2014  . MDD (major depressive disorder), recurrent episode, severe 02/10/2014  .  Nonspecific abnormal electrocardiogram (ECG) (EKG) 02/07/2014  . Non-compliant behavior 02/03/2014  . Other abnormal glucose 10/02/2013  . Morton's neuroma of left foot 08/07/2013  . Stress fracture of left foot 07/17/2013  . 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 Major depressive disorder, recurrent episode, severe 03/13/2012  . Hyperglycemia 01/26/2012  . Hypogonadism male 01/24/2012  . Syncope and collapse 01/20/2012  .  OSA (obstructive sleep apnea) 01/17/2011  . CHEST TIGHTNESS 02/18/2008    Mckyle Solanki, Annye Rusk, SLP 01/22/2015, 10:14 AM  [facility] [address] [place], [place], [address] Phone: [phone]   Fax:  [phone]

## 2015-01-22 NOTE — Therapy (Signed)
[facility] [address] [place], [place], [address] Phone: [phone]   Fax:  [phone]  Occupational Therapy Treatment  Patient Details  Name: [name] [name] MRN: [id number] Date of Birth: June 29, 1951 Referring Provider:  Olga Millers, MD  Encounter Date: 01/22/2015      OT End of Session - 01/22/15 0755    Visit Number 14   Number of Visits 17   Date for OT Re-Evaluation 01/29/15   Authorization Type BCBS 75 visit limit combined, no auth   OT Start Time [id number]   OT Stop Time 0830   OT Time Calculation (min) 41 min   Activity Tolerance Patient tolerated treatment well   Behavior During Therapy [facility] for tasks assessed/performed      Past Medical History  Diagnosis Date  . Headache(784.0)   . Cardiac murmur     as a child  . Streptococcal meningitis     as an infant  . Depression   .  OSA (obstructive sleep apnea) 01/17/2011    npsg 2012:  AHI 67/hr. Auto titration 2012:  Optimal pressure 12cm.   Marland Kitchen HEADACHES, HX OF 02/18/2008    Qualifier: Diagnosis of  By: Danny Lawless CMA, [place]    . APPENDECTOMY, HX OF 02/18/2008    Qualifier: Diagnosis of  By: Danny Lawless CMA, [place]    . Parkinson disease 11/2014    Past Surgical History  Procedure Laterality Date  . Nasal sinus surgery      x 4 as a child  . Vasectomy    . Appendectomy  1967    There were no vitals filed for this visit.  Visit Diagnosis:  Bradykinesia  Rigidity  Lack of coordination  Abnormal posture  Decreased functional mobility and endurance      Subjective Assessment - 01/22/15 0753    Subjective  Pt reports that he no longer is having chest pain at work   Pertinent History Diagnosed with Parkinson's approx 1 month ago.   Currently in Pain? No/denies                      OT Treatments/Exercises (OP) - 01/22/15 0001    Functional Reaching Activities   High Level In sitting, reaching overhead to plcace small  pegs in vertical pegboard with each hand for increased coordination with set-up for big movements and min cues for PWR! hands prior to picking up pegs.           [facility]) - 01/22/15 0800    PWR! exercises Moves in [place];Moves in prone   PWR! Up 10   PWR! Rock 10    PWR! Twist 10 to each side   PWR! Step 10 to each side   Comments min v.c. In [place]! Up 20   PWR! Rock 10 ea side   PWR! Twist 10 ea side   PWR! Step 10 each side   Comments min v.c. In prone               OT Short Term Goals - 12/31/14 0852    OT SHORT TERM GOAL #1   Title Pt will be independent with HEP--due 12/30/14   Time 4   Period Weeks   Status Achieved  12/29/14   OT SHORT TERM GOAL #2   Title Pt will verbalize understanding of PD symptoms and ways to prevent future complications.--due 12/30/14   Time 4   Period Weeks   Status Achieved  12/29/14   OT SHORT TERM GOAL #3   Title Pt will verbalize understanding of PD-specific appropriate community resources.--due 12/30/14   Baseline verbalizes understanding of POP and community fitness opportunities   Time 4   Period Weeks   Status Achieved   OT SHORT TERM GOAL #4   Title Pt will complete dressing fasteners mod I using strategies/AE prn.--due 12/30/14   Baseline Pt demonstrates understanding of button hook use, pt continues to report difficulty tying apron/shoes   Time 4   Period Weeks   Status Partially Met   OT SHORT TERM GOAL #5   Title Pt will improve coordination/functional reaching as shown by improving score on box and blocks test by at least 10 with RUE.--due 12/30/14   Baseline 47 blocks on 12/31/14   Time 4   Period Weeks   Status Achieved           OT Long Term Goals - 12/01/14 0903    OT LONG TERM GOAL #1   Title Pt will verbalize understanding of AE/strategies for ADLs/IADLs prn.--due 01/29/15   Time 8   Period Weeks   Status New   OT LONG TERM GOAL #2   Title Pt will improve coordination for ADLs as shown by  improving time on 9-hole peg test by at least 5 bilaterally.--due 01/29/15   Baseline R-32.59sec, L-35.66sec   Time 8   Period Weeks   Status New   OT LONG TERM GOAL #3   Title Pt will improve coordination/functional reaching as shown by improving score on box and blocks test by at least 5 with LUE.--due 01/29/15   Baseline 45   Time 8   Period Weeks   Status New   OT LONG TERM GOAL #4   Title Pt will improve coordination/functional reaching as shown by improving score on box and blocks test by at least 10 with RUE.--due 01/29/15   Baseline 37   Time 8   Period Weeks   Status New   OT LONG TERM GOAL #5   Title Pt will improve dressing ability as shown by improving time on PPT#4 by at least 5sec.--due 01/29/15   Baseline 24.53sec   Time 8   Period Weeks   Status New   Long Term Additional Goals   Additional Long Term Goals Yes   OT LONG TERM GOAL #6   Title Pt will improve bradykinesia as shown by improving time on PPT#2 by at least 5sec.--due 01/29/15   Baseline 15.35   Time 8   Period Weeks   Status New               Plan - 01/22/15 0756    Clinical Impression Statement Pt is progressing towards goals with improved coordination and less cues for big movements.    Plan anticipate d/c next week, check goals, FOTO?   Consulted and Agree with Plan of Care Patient        Problem List Patient Active Problem List   Diagnosis Date Noted  . Sinus disease 07/28/2014  . MDD (major depressive disorder), recurrent episode, severe 02/10/2014  . Nonspecific abnormal electrocardiogram (ECG) (EKG) 02/07/2014  . Non-compliant behavior 02/03/2014  . Other abnormal glucose 10/02/2013  . Morton's neuroma of left foot 08/07/2013  . Stress fracture of left foot 07/17/2013  . 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 Major depressive disorder, recurrent episode, severe 03/13/2012  . Hyperglycemia 01/26/2012  . Hypogonadism male 01/24/2012  . Syncope  and collapse  01/20/2012  .  OSA (obstructive sleep apnea) 01/17/2011  . CHEST TIGHTNESS 02/18/2008    [facility] 01/22/2015, 8:31 AM  [facility] [address] [place], [place], [address] Phone: [phone]   Fax:  [place], OTR/L 01/22/2015 8:31 AM

## 2015-01-26 ENCOUNTER — Encounter: Payer: Self-pay | Admitting: Occupational Therapy

## 2015-01-27 ENCOUNTER — Ambulatory Visit: Payer: Federal, State, Local not specified - PPO | Admitting: Occupational Therapy

## 2015-01-27 ENCOUNTER — Ambulatory Visit: Payer: Federal, State, Local not specified - PPO | Admitting: Speech Pathology

## 2015-01-27 ENCOUNTER — Ambulatory Visit: Payer: Federal, State, Local not specified - PPO | Admitting: Physical Therapy

## 2015-01-27 DIAGNOSIS — R29898 Other symptoms and signs involving the musculoskeletal system: Secondary | ICD-10-CM

## 2015-01-27 DIAGNOSIS — R279 Unspecified lack of coordination: Secondary | ICD-10-CM

## 2015-01-27 DIAGNOSIS — G20 Parkinson's disease: Secondary | ICD-10-CM | POA: Diagnosis not present

## 2015-01-27 DIAGNOSIS — R490 Dysphonia: Secondary | ICD-10-CM

## 2015-01-27 DIAGNOSIS — R258 Other abnormal involuntary movements: Secondary | ICD-10-CM

## 2015-01-27 DIAGNOSIS — Z7409 Other reduced mobility: Secondary | ICD-10-CM

## 2015-01-27 DIAGNOSIS — R269 Unspecified abnormalities of gait and mobility: Secondary | ICD-10-CM

## 2015-01-27 NOTE — Patient Instructions (Addendum)
Tips to reduce freezing episodes with standing or walking:  1. Stand tall with your feet wide, so that you can rock and weight shift through your hips. 2. Don't try to fight the freeze: if you begin taking slower, faster, smaller steps, STOP, get your posture tall, and RESET your posture and balance.  Take a deep breath before taking the BIG step to start again. 3. March in place, with high knee stepping, to get started walking again. 4. Use auditory cues:  Count out loud, think of a familiar tune or song or cadence, use pocket metronome, to use rhythm to get started walking again. 5. Use visual cues:  Use a line to step over, use laser pointer line to step over, (using BIG steps) to start walking again. 6. Use visual targets to keep your posture tall (look ahead and focus on an object or target at eye level). 7. As you approach where your destination with walking, count your steps out loud and/or focus on your target with your eyes until you are fully there. 8. Use appropriate assistive device, as advised by your physical therapist to assist with taking longer, consistent steps. 9.      OPTIMAL FITNESS PROGRAM AFTER D/C from PT:  1)THERAPY EXERCISES- -Do these Exercises daily -Make a chart or flow sheet to track weekly exercises performed -THESE EXERCISES ARE VERY IMPORTANT TO TARGET YOUR PARKINSON'S SYMPTOMS -MAKE SURE TO PERFORM WITH DELIBERATE, LARGE MOVEMENT PATTERNS  2)WALKING -Walk for 20-30 minutes 2-3 times per week -Outdoor or indoor walking  -Use walking poles for more intensity for your walking  3)AEROBIC ACTIVITY -Use of machines (bike, seated steppers, arm bike) for 20-30 minutes, 2-3 times per week -[employer], [employer], other fitness center

## 2015-01-27 NOTE — Therapy (Signed)
[facility] [address] [place], [place], [address] Phone: [phone]   Fax:  [phone]  Physical Therapy Treatment  Patient Details  Name: [name] [name] MRN: [id number] Date of Birth: 05/04/1951 Referring Provider:  Olga Millers, MD  Encounter Date: 01/27/2015      PT End of Session - 01/27/15 2238    Visit Number 16   Number of Visits 17   Date for PT Re-Evaluation 01/30/15   Authorization Type BCBS 75 visit limit combined   PT Start Time 0935   PT Stop Time 1015   PT Time Calculation (min) 40 min   Activity Tolerance Patient tolerated treatment well   Behavior During Therapy [facility] for tasks assessed/performed      Past Medical History  Diagnosis Date  . Headache(784.0)   . Cardiac murmur     as a child  . Streptococcal meningitis     as an infant  . Depression   .  OSA (obstructive sleep apnea) 01/17/2011    npsg 2012:  AHI 67/hr. Auto titration 2012:  Optimal pressure 12cm.   Marland Kitchen HEADACHES, HX OF 02/18/2008    Qualifier: Diagnosis of  By: Danny Lawless CMA, [place]    . APPENDECTOMY, HX OF 02/18/2008    Qualifier: Diagnosis of  By: Danny Lawless CMA, [place]    . Parkinson disease 11/2014    Past Surgical History  Procedure Laterality Date  . Nasal sinus surgery      x 4 as a child  . Vasectomy    . Appendectomy  1967    There were no vitals filed for this visit.  Visit Diagnosis:  Bradykinesia  Abnormality of gait  Decreased functional mobility and endurance      Subjective Assessment - 01/27/15 0936    Subjective Not really anything new; realized that I can't stand on one foot without losing balance.  Pt also feels like he is having freezing episodes more with gait when he is trying to multi-task.   Currently in Pain? No/denies       Self Care: Discussed tips to reduce freezing episodes with walking while carrying things and with distractions.  Discussed use of wife to assist with  verbal or tactile cues in the times where he is freezing.  Discussed/practiced stair negotiation, making sure to take deliberate foot clearance.  Discussed upcoming doctor's visit and need to make list of questions for optimal discussion.    Discussed optimal continued and community fitness upon D/C from PT.  Provided handout.  Stair negotiation 3 reps with handrail with step through pattern, then 3 reps with no handrail, carrying basket of weighted balls, with step-to pattern with cues for deliberate foot placement.                            PT Education - 01/27/15 2237    Education provided Yes   Education Details Fall prevention/tips to reduce freezing, optimal community fitness upon D/C from PT.   Person(s) Educated Patient   Methods Explanation;Demonstration;Handout   Comprehension Verbalized understanding          PT Short Term Goals - 12/31/14 1025    PT SHORT TERM GOAL #1   Title Pt will be independent with HEP for improved transfers, balance, and gait. (Target 12/30/14)   Status Partially Met   PT SHORT TERM GOAL #2   Title Pt will improve 5x sit<>stand to less than or equal to  11.5 seconds for improved transfer efficiency and safety.   Status Achieved   PT SHORT TERM GOAL #3   Title Pt will improve Functional Gait Assessment score to at least 18/30 for decreased fall risk.   Status Achieved   PT SHORT TERM GOAL #4   Title Pt will improve TUG score to less than or equal to 13.5 seconds for decreased fall risk.   Status Achieved   PT SHORT TERM GOAL #5   Title Pt will verbalize understanding of local Parkinson's-disease related resources.   Status Achieved           PT Long Term Goals - 01/27/15 2240    PT LONG TERM GOAL #1   Title Pt will verbalize understanding of fall prevention techniques within the home environment. (Target 01/30/15)   Status Achieved   PT LONG TERM GOAL #2   Title Pt will improve gait velocity to at least 2.62 ft/sec for  improved gait efficiency and safety.   Time 8   Period Weeks   Status New   PT LONG TERM GOAL #3   Title Pt will improve Functional Gait Assessment to at least 22/30 for decreased fall risk.   Status Achieved   PT LONG TERM GOAL #4   Title Pt will improve TUG cognitive score to less than or equal to 15 seconds for decreased fall risk.   Time 8   Period Weeks   Status New   PT LONG TERM GOAL #5   Title Pt will verbalize plans for continued community fitness upon D/C from PT.   Status Achieved               Plan - 01/27/15 2239    Clinical Impression Statement Pt has met LTG #1, 3, and 5.  Anticipate pt progressing to meet additional LTGs.  Pt verbalized and demonstrates understanding of larger scale movement patterns during therapy sessions; however,  pt notes increased freezing episodes during functional activities in his day.  Plan for discharge next visit.   Pt will benefit from skilled therapeutic intervention in order to improve on the following deficits Abnormal gait;Decreased activity tolerance;Decreased balance;Decreased endurance;Decreased mobility;Difficulty walking;Decreased strength;Improper body mechanics;Postural dysfunction   Rehab Potential Good   PT Frequency 2x / week   PT Duration 8 weeks  wk 8 of 8   PT Next Visit Plan Discharge next visit; FOTO?   Consulted and Agree with Plan of Care Patient        Problem List Patient Active Problem List   Diagnosis Date Noted  . Sinus disease 07/28/2014  . MDD (major depressive disorder), recurrent episode, severe 02/10/2014  . Nonspecific abnormal electrocardiogram (ECG) (EKG) 02/07/2014  . Non-compliant behavior 02/03/2014  . Other abnormal glucose 10/02/2013  . Morton's neuroma of left foot 08/07/2013  . Stress fracture of left foot 07/17/2013  . 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 Major depressive disorder, recurrent episode, severe 03/13/2012  . Hyperglycemia  01/26/2012  . Hypogonadism male 01/24/2012  . Syncope and collapse 01/20/2012  .  OSA (obstructive sleep apnea) 01/17/2011  . CHEST TIGHTNESS 02/18/2008    Eschol Auxier W. 01/27/2015, 10:45 PM  Mady Haagensen, PT 01/27/2015 10:45 PM Phone: [phone] Fax: [facility] [address] [place], [place], [address] Phone: [phone]   Fax:  [phone]

## 2015-01-27 NOTE — Therapy (Signed)
[facility] [address] [place], [place], [address] Phone: [phone]   Fax:  [phone]  Occupational Therapy Treatment  Patient Details  Name: [name] [name] MRN: [id number] Date of Birth: 1950/11/07 Referring Provider:  Olga Millers, MD  Encounter Date: 01/27/2015      OT End of Session - 01/27/15 0931    Visit Number 15   Number of Visits 17   Date for OT Re-Evaluation 01/29/15   Authorization Type [employer] 75 visit limit combined, no auth   OT Start Time 0848   OT Stop Time 0930   OT Time Calculation (min) 42 min   Activity Tolerance Patient tolerated treatment well   Behavior During Therapy [facility] for tasks assessed/performed      Past Medical History  Diagnosis Date  . Headache(784.0)   . Cardiac murmur     as a child  . Streptococcal meningitis     as an infant  . Depression   .  OSA (obstructive sleep apnea) 01/17/2011    npsg 2012:  AHI 67/hr. Auto titration 2012:  Optimal pressure 12cm.   Marland Kitchen HEADACHES, HX OF 02/18/2008    Qualifier: Diagnosis of  By: Danny Lawless CMA, [place]    . APPENDECTOMY, HX OF 02/18/2008    Qualifier: Diagnosis of  By: Danny Lawless CMA, [place]    . Parkinson disease 11/2014    Past Surgical History  Procedure Laterality Date  . Nasal sinus surgery      x 4 as a child  . Vasectomy    . Appendectomy  1967    There were no vitals filed for this visit.  Visit Diagnosis:  Lack of coordination  Bradykinesia  Rigidity      Subjective Assessment - 01/27/15 0857    Subjective  Pt reports cleaning out garage with big movements   Pertinent History Diagnosed with Parkinson's approx 1 month ago.   Currently in Pain? No/denies   Multiple Pain Sites No        Treatement: Therapist started checking progress towards goals in prep for d/c. Arm bike x 6 mins level 1 for conditioning, pt able to maintain 38-40 RPM. Education provided regarding strategies for handwriting. Pt  demonstrates good legibility and letter size with printing (handout provided.)                        OT Short Term Goals - 01/27/15 0859    OT SHORT TERM GOAL #1   Title Pt will be independent with HEP--due 12/30/14   Time 4   Period Weeks   Status Achieved  12/29/14   OT SHORT TERM GOAL #2   Title Pt will verbalize understanding of PD symptoms and ways to prevent future complications.--due 12/30/14   Time 4   Period Weeks   Status Achieved  12/29/14   OT SHORT TERM GOAL #3   Title Pt will verbalize understanding of PD-specific appropriate community resources.--due 12/30/14   Baseline verbalizes understanding of POP and community fitness opportunities   Time 4   Period Weeks   Status Achieved   OT SHORT TERM GOAL #4   Title Pt will complete dressing fasteners mod I using strategies/AE prn.--due 12/30/14   Baseline Pt demonstrates understanding of button hook use, pt continues to report difficulty tying apron/shoes   Time 4   Period Weeks   Status Achieved   OT SHORT TERM GOAL #5   Title Pt will improve coordination/functional reaching as  shown by improving score on box and blocks test by at least 10 with RUE.--due 12/30/14   Baseline 47 blocks on 12/31/14   Time 4   Period Weeks   Status Achieved           OT Long Term Goals - 01/27/15 0900    OT LONG TERM GOAL #1   Title Pt will verbalize understanding of AE/strategies for ADLs/IADLs prn.--due 01/29/15   Time 8   Period Weeks   Status Achieved   OT LONG TERM GOAL #2   Title Pt will improve coordination for ADLs as shown by improving time on 9-hole peg test by at least 5 bilaterally.--due 01/29/15   Baseline RUE 28.50 secs, LUE 30.82 secs   Time 8   Period Weeks   Status Partially Met   OT LONG TERM GOAL #3   Title Pt will improve coordination/functional reaching as shown by improving score on box and blocks test by at least 5 with LUE.--due 01/29/15   Baseline 52 blocks-01/27/15   Time 8   Period Weeks    Status Achieved   OT LONG TERM GOAL #4   Title Pt will improve coordination/functional reaching as shown by improving score on box and blocks test by at least 10 with RUE.--due 01/29/15   Baseline 56 blocks- 01/27/15   Time 8   Period Weeks   Status Achieved   OT LONG TERM GOAL #5   Title Pt will improve dressing ability as shown by improving time on PPT#4 by at least 5sec.--due 01/29/15   Baseline 24.53sec   Time 8   Period Weeks   Status New   OT LONG TERM GOAL #6   Title Pt will improve bradykinesia as shown by improving time on PPT#2 by at least 5sec.--due 01/29/15   Baseline 12.44 secs- 01/27/15   Time 8   Period Weeks   Status Not Met               Plan - 01/27/15 0932    Clinical Impression Statement Pt demonstrates progress towards goals. Pt agrees with plans to d/c next visit.   Plan check remaining goal, FOTO, review HEP PRN?   OT Home Exercise Plan Has been issued:  PWR! moves seated, coordination/ PWR! hands HEP, PWR! moves (basic 4) in prone and quadraped (12/29/14) ways to incorporate big movments with ADLS. red theraband issued 01/13/15   Consulted and Agree with Plan of Care Patient        Problem List Patient Active Problem List   Diagnosis Date Noted  . Sinus disease 07/28/2014  . MDD (major depressive disorder), recurrent episode, severe 02/10/2014  . Nonspecific abnormal electrocardiogram (ECG) (EKG) 02/07/2014  . Non-compliant behavior 02/03/2014  . Other abnormal glucose 10/02/2013  . Morton's neuroma of left foot 08/07/2013  . Stress fracture of left foot 07/17/2013  . 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 Major depressive disorder, recurrent episode, severe 03/13/2012  . Hyperglycemia 01/26/2012  . Hypogonadism male 01/24/2012  . Syncope and collapse 01/20/2012  .  OSA (obstructive sleep apnea) 01/17/2011  . CHEST TIGHTNESS 02/18/2008    RINE,KATHRYN 01/27/2015, 1:09 PM Theone Murdoch, OTR/L [phone]  [phone] Phone: [phone] 1:09 PM 01/27/2015 [facility] [address] [place], [place], [address] Phone: [phone]   Fax:  [phone]

## 2015-01-27 NOTE — Therapy (Signed)
[facility] [address] [place], [place], [address] Phone: [phone]   Fax:  [phone]  Speech Language Pathology Treatment  Patient Details  Name: [name] [name] MRN: [id number] Date of Birth: 07/07/1951 Referring Provider:  Olga Millers, MD  Encounter Date: 01/27/2015      End of Session - 01/27/15 0848    Visit Number 16   Date for SLP Re-Evaluation 01/29/15   SLP Start Time 0803   SLP Stop Time  0848   SLP Time Calculation (min) 45 min   Activity Tolerance Patient tolerated treatment well      Past Medical History  Diagnosis Date  . Headache(784.0)   . Cardiac murmur     as a child  . Streptococcal meningitis     as an infant  . Depression   .  OSA (obstructive sleep apnea) 01/17/2011    npsg 2012:  AHI 67/hr. Auto titration 2012:  Optimal pressure 12cm.   Marland Kitchen HEADACHES, HX OF 02/18/2008    Qualifier: Diagnosis of  By: Danny Lawless CMA, [place]    . APPENDECTOMY, HX OF 02/18/2008    Qualifier: Diagnosis of  By: Danny Lawless CMA, [place]    . Parkinson disease 11/2014    Past Surgical History  Procedure Laterality Date  . Nasal sinus surgery      x 4 as a child  . Vasectomy    . Appendectomy  1967    There were no vitals filed for this visit.  Visit Diagnosis: Hypokinetic Parkinsonian dysphonia      Subjective Assessment - 01/27/15 0805    Subjective "I'm ok"   Currently in Pain? No/denies               ADULT SLP TREATMENT - 01/27/15 0806    General Information   Behavior/Cognition Alert;Cooperative;Pleasant mood   Treatment Provided   Treatment provided Cognitive-Linquistic   Pain Assessment   Pain Assessment No/denies pain   Cognitive-Linquistic Treatment   Treatment focused on Dysarthria   Skilled Treatment Sustained /a/ average 86dB - Pt with good awareness of when voice quality is hoarse and self corrects /a/ with mod I.  Pt participated in audible conversation for 20  minutes in gym with supervision cues. Required 2 min visual cues during conversation.   Assessment / Recommendations / Plan   Plan Continue with current plan of care   Progression Toward Goals   Progression toward goals Progressing toward goals            SLP Short Term Goals - 01/09/15 0819    SLP SHORT TERM GOAL #1   Title Pt will demonstrate abdominal breathing in structured simple conversation for 10 minutes with occassional minimal cues.12/29/14   Time --   Period --   Status Not Met   SLP SHORT TERM GOAL #2   Title Pt will demonstrate average of 84dB for sustained /a/ over 3 sessions with rare minimal assistance (12/29/14)   Time --   Period --   Status Not Met   SLP SHORT TERM GOAL #3   Title Pt will average 70dB in simple structured speech tasks over 20 minutes with occassional minimal assistance (12/29/14)   Status Achieved          SLP Long Term Goals - 01/27/15 0848    SLP LONG TERM GOAL #1   Title Demonstrate abdominal breathing for mildly complex conversation for 15 minutes with occassional minimal cues (01/29/15)   Time 1   Status  Achieved   SLP LONG TERM GOAL #2   Title Pt will maintain an average of 70dBfor modcomplex conversation over 15 minutes with rare minimal assistance over 3 sessions (01/29/15)   Time 1   Period Weeks   Status Revised   SLP LONG TERM GOAL #3   Title Pt will converse audibly in noisy environment for 15 minutes with rare minimal assistance (01/29/15)   Time 1   Status Achieved          Plan - 01/27/15 0846    Clinical Impression Statement Continue ST 1 more session to maximize carryover of loud volume outside of  therapy room.   Speech Therapy Frequency 2x / week   Duration 1 week   Treatment/Interventions Compensatory strategies;Functional tasks;Patient/family education;SLP instruction and feedback;Internal/external aids   Potential to Achieve Goals Good   Potential Considerations Severity of impairments   Consulted and Agree  with Plan of Care Patient        Problem List Patient Active Problem List   Diagnosis Date Noted  . Sinus disease 07/28/2014  . MDD (major depressive disorder), recurrent episode, severe 02/10/2014  . Nonspecific abnormal electrocardiogram (ECG) (EKG) 02/07/2014  . Non-compliant behavior 02/03/2014  . Other abnormal glucose 10/02/2013  . Morton's neuroma of left foot 08/07/2013  . Stress fracture of left foot 07/17/2013  . 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 Major depressive disorder, recurrent episode, severe 03/13/2012  . Hyperglycemia 01/26/2012  . Hypogonadism male 01/24/2012  . Syncope and collapse 01/20/2012  .  OSA (obstructive sleep apnea) 01/17/2011  . CHEST TIGHTNESS 02/18/2008    Lovvorn, Annye Rusk, SLP 01/27/2015, 8:50 AM  [facility] [address] [place], [place], [address] Phone: [phone]   Fax:  [phone]

## 2015-01-29 ENCOUNTER — Ambulatory Visit: Payer: Federal, State, Local not specified - PPO

## 2015-01-29 ENCOUNTER — Ambulatory Visit: Payer: Federal, State, Local not specified - PPO | Admitting: Occupational Therapy

## 2015-01-29 ENCOUNTER — Ambulatory Visit: Payer: Federal, State, Local not specified - PPO | Admitting: Physical Therapy

## 2015-01-29 ENCOUNTER — Encounter: Payer: Self-pay | Admitting: Occupational Therapy

## 2015-01-29 DIAGNOSIS — G20 Parkinson's disease: Secondary | ICD-10-CM

## 2015-01-29 DIAGNOSIS — R490 Dysphonia: Secondary | ICD-10-CM

## 2015-01-29 DIAGNOSIS — R29898 Other symptoms and signs involving the musculoskeletal system: Secondary | ICD-10-CM

## 2015-01-29 DIAGNOSIS — R258 Other abnormal involuntary movements: Secondary | ICD-10-CM

## 2015-01-29 DIAGNOSIS — R279 Unspecified lack of coordination: Secondary | ICD-10-CM

## 2015-01-29 DIAGNOSIS — R269 Unspecified abnormalities of gait and mobility: Secondary | ICD-10-CM

## 2015-01-29 NOTE — Therapy (Signed)
[facility] [address] [place], [place], [address] Phone: [phone]   Fax:  [phone]  Occupational Therapy Treatment  Patient Details  Name: [name] [name] MRN: [id number] Date of Birth: June 28, 1951 Referring Provider:  Olga Millers, MD  Encounter Date: 01/29/2015      OT End of Session - 01/29/15 0758    Visit Number 16   Number of Visits 17   Date for OT Re-Evaluation 01/29/15   Authorization Type BC[employer] 75 visit limit combined, no auth   OT Start Time [id number]   OT Stop Time 0836   OT Time Calculation (min) 45 min   Activity Tolerance Patient tolerated treatment well   Behavior During Therapy [facility] for tasks assessed/performed      Past Medical History  Diagnosis Date  . Headache(784.0)   . Cardiac murmur     as a child  . Streptococcal meningitis     as an infant  . Depression   .  OSA (obstructive sleep apnea) 01/17/2011    npsg 2012:  AHI 67/hr. Auto titration 2012:  Optimal pressure 12cm.   Marland Kitchen HEADACHES, HX OF 02/18/2008    Qualifier: Diagnosis of  By: Danny Lawless CMA, [place]    . APPENDECTOMY, HX OF 02/18/2008    Qualifier: Diagnosis of  By: Danny Lawless CMA, [place]    . Parkinson disease 11/2014    Past Surgical History  Procedure Laterality Date  . Nasal sinus surgery      x 4 as a child  . Vasectomy    . Appendectomy  1967    There were no vitals filed for this visit.  Visit Diagnosis:  Bradykinesia  Rigidity  Lack of coordination      Subjective Assessment - 01/29/15 0757    Subjective  Today is my last day   Currently in Pain? No/denies                      OT Treatments/Exercises (OP) - 01/29/15 0001    ADLs   ADL Comments Checked remaining goal and discussed progress, that PD typically affects one side more but everyone is different, importance of continuing HEP, and rationale/research that reports re-evaluation in approx 6 months for early intervention of  functional changes.  Reviewed coordination HEP.  Pt verbalized understanding/agreement.   Fine Motor Coordination   Flipping cards min v.c. for big movement, bilaterally   Dealing card with thumb min v.c for big movement bilaterally   Manipulating coins min v.c. bilaterally with each hand to stack   Stacking coins min v.c. bilaterally with each hand   Tossing ball with min v.c. for big movements   Other Fine Motor Exercises rotating ball in fingertips with min v.c. for big movement bilaterally   Neurological Re-education Exercises   Reciprocal Movements Arm bike x9mn level 3 for conditioning with min v.c. for speed, pt able to maintain 36-41rpm without rest.                  OT Short Term Goals - 01/27/15 0859    OT SHORT TERM GOAL #1   Title Pt will be independent with HEP--due 12/30/14   Time 4   Period Weeks   Status Achieved  12/29/14   OT SHORT TERM GOAL #2   Title Pt will verbalize understanding of PD symptoms and ways to prevent future complications.--due 12/30/14   Time 4   Period Weeks   Status Achieved  12/29/14  OT SHORT TERM GOAL #3   Title Pt will verbalize understanding of PD-specific appropriate community resources.--due 12/30/14   Baseline verbalizes understanding of POP and community fitness opportunities   Time 4   Period Weeks   Status Achieved   OT SHORT TERM GOAL #4   Title Pt will complete dressing fasteners mod I using strategies/AE prn.--due 12/30/14   Baseline Pt demonstrates understanding of button hook use, pt continues to report difficulty tying apron/shoes   Time 4   Period Weeks   Status Achieved   OT SHORT TERM GOAL #5   Title Pt will improve coordination/functional reaching as shown by improving score on box and blocks test by at least 10 with RUE.--due 12/30/14   Baseline 47 blocks on 12/31/14   Time 4   Period Weeks   Status Achieved           OT Long Term Goals - 01/29/15 0801    OT LONG TERM GOAL #1   Title Pt will verbalize  understanding of AE/strategies for ADLs/IADLs prn.--due 01/29/15   Time 8   Period Weeks   Status Achieved   OT LONG TERM GOAL #2   Title Pt will improve coordination for ADLs as shown by improving time on 9-hole peg test by at least 5 bilaterally.--due 01/29/15   Baseline RUE 28.50 secs, LUE 30.82 secs   Time 8   Period Weeks   Status Partially Met   OT LONG TERM GOAL #3   Title Pt will improve coordination/functional reaching as shown by improving score on box and blocks test by at least 5 with LUE.--due 01/29/15   Baseline 52 blocks-01/27/15   Time 8   Period Weeks   Status Achieved   OT LONG TERM GOAL #4   Title Pt will improve coordination/functional reaching as shown by improving score on box and blocks test by at least 10 with RUE.--due 01/29/15   Baseline 56 blocks- 01/27/15   Time 8   Period Weeks   Status Achieved   OT LONG TERM GOAL #5   Title Pt will improve dressing ability as shown by improving time on PPT#4 by at least 5sec.--due 01/29/15   Baseline 24.53sec   Time 8   Period Weeks   Status Achieved  01/29/15:  11.56sec   OT LONG TERM GOAL #6   Title Pt will improve bradykinesia as shown by improving time on PPT#2 by at least 5sec.--due 01/29/15   Baseline 12.44 secs- 01/27/15   Time 8   Period Weeks   Status Not Met               Plan - 01/29/15 0759    Clinical Impression Statement Pt has made good progres and is appropriate for d/c at this time.   Plan d/c OT, re-eval in approx 6 months (scheduled)   OT Home Exercise Plan Has been issued:  PWR! moves seated, coordination/ PWR! hands HEP, PWR! moves (basic 4) in prone and quadraped (12/29/14) ways to incorporate big movments with ADLS. red theraband issued 01/13/15   Consulted and Agree with Plan of Care Patient       OCCUPATIONAL THERAPY DISCHARGE SUMMARY   Remaining deficits: Bradykinesia, rigidity, posture changes, decreased functional mobility, decreased coordination, but all improved   Education /  Equipment: Pt instructed in PD-specific HEP, ways to prevent future complications, appropriate community resources, adaptive strategies for ADLs.  Pt verbalized understanding.  Plan: Patient agrees to discharge.  Patient goals were partially met. Patient is being  discharged due to meeting the stated rehab goals. and reaching maximal rehab potential at this time.  Pt met all STGs and 4/6 LTGs with additional LTG partially met.  Pt would benefit from re-evaluation in approx 6 months to assess for need for further therapy/functional changes due to progressive nature of diagnosis.  ?????     Problem List Patient Active Problem List   Diagnosis Date Noted  . Sinus disease 07/28/2014  . MDD (major depressive disorder), recurrent episode, severe 02/10/2014  . Nonspecific abnormal electrocardiogram (ECG) (EKG) 02/07/2014  . Non-compliant behavior 02/03/2014  . Other abnormal glucose 10/02/2013  . Morton's neuroma of left foot 08/07/2013  . Stress fracture of left foot 07/17/2013  . 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 Major depressive disorder, recurrent episode, severe 03/13/2012  . Hyperglycemia 01/26/2012  . Hypogonadism male 01/24/2012  . Syncope and collapse 01/20/2012  .  OSA (obstructive sleep apnea) 01/17/2011  . CHEST TIGHTNESS 02/18/2008    [facility] 01/29/2015, 8:48 AM  [facility] [address] [place], [place], [address] Phone: [phone]   Fax:  [place], OTR/L 01/29/2015 8:48 AM

## 2015-01-29 NOTE — Patient Instructions (Signed)
Consider using a cane for your night time walking after work when you are fatigued  You can usually find canes at drug stores or medical supply stores -You want to make sure it has an offset-handle -You could also look for the ones that collapse to a smaller size for more easily carrying into work -These typically cost around $20

## 2015-01-29 NOTE — Therapy (Signed)
[facility] [address] [place], [place], [address] Phone: [phone]   Fax:  [phone]  Physical Therapy Treatment  Patient Details  Name: [name] [name] MRN: [id number] Date of Birth: [birth year] Referring Provider:  Olga Millers, MD  Encounter Date: 01/29/2015      PT End of Session - 01/29/15 1605    Visit Number 17   Number of Visits 17   Date for PT Re-Evaluation 01/30/15   Authorization Type BCBS 75 visit limit combined   PT Start Time 0850   PT Stop Time 0930   PT Time Calculation (min) 40 min   Activity Tolerance Patient tolerated treatment well   Behavior During Therapy [facility] for tasks assessed/performed      Past Medical History  Diagnosis Date  . Headache(784.0)   . Cardiac murmur     as a child  . Streptococcal meningitis     as an infant  . Depression   .  OSA (obstructive sleep apnea) 01/17/2011    npsg 2012:  AHI 67/hr. Auto titration 2012:  Optimal pressure 12cm.   Marland Kitchen HEADACHES, HX OF 02/18/2008    Qualifier: Diagnosis of  By: Danny Lawless CMA, [place]    . APPENDECTOMY, HX OF 02/18/2008    Qualifier: Diagnosis of  By: Danny Lawless CMA, [place]    . Parkinson disease 11/2014    Past Surgical History  Procedure Laterality Date  . Nasal sinus surgery      x 4 as a child  . Vasectomy    . Appendectomy  1967    There were no vitals filed for this visit.  Visit Diagnosis:  Bradykinesia  Abnormality of gait      Subjective Assessment - 01/29/15 0850    Subjective No problems, no falls, no pain.   Currently in Pain? No/denies       Reviewed discussion of techniques to reduce freezing with gait.  Reviewed optimal continued fitness routine upon D/C.  Discussed use of recumbent bike at home.       [facility] PT Assessment - 01/29/15 0856    Transfers   Transfers Sit to Stand;Stand to Sit   Sit to Stand 7: Independent;Without upper extremity assist;From chair/3-in-1;Five times sit to  stand  5x sit<>stand 15.18 sec   Stand to Sit 7: Independent;Without upper extremity assist;To chair/3-in-1   Ambulation/Gait   Ambulation/Gait Yes   Gait velocity 13.22 sec=2.48 ft/sec  9.25 best speed with cueing   Standardized Balance Assessment   Standardized Balance Assessment Timed Up and Go Test   Timed Up and Go Test   TUG Normal TUG;Manual TUG;Cognitive TUG   Normal TUG (seconds) 11.91   Manual TUG (seconds) 11.23   Cognitive TUG (seconds) 12.57       Pt reports that often he notices increased shuffling and difficulty with step length upon leaving work late at night in the dark when he is fatigued.  PT suggests possibility of using cane for improved step length and additional stability during this time.  Pt ambulates 400 ft using single point cane, with good use of reciprocal pattern, with increased and consistent step length.    Added lateral weightshifting and lifting lower extremity with deliberate effort to improve  Single limb stance.                          PT Education - 01/29/15 1604    Education provided Yes   Education Details Use  of cane for night time fatigue after work shift; how to obtain cane, plans for d/c therapy/progress with goals, return to PT in approximately 6 months.   Person(s) Educated Patient   Methods Explanation;Demonstration;Handout   Comprehension Verbalized understanding;Returned demonstration          PT Short Term Goals - 12/31/14 1025    PT SHORT TERM GOAL #1   Title Pt will be independent with HEP for improved transfers, balance, and gait. (Target 12/30/14)   Status Partially Met   PT SHORT TERM GOAL #2   Title Pt will improve 5x sit<>stand to less than or equal to 11.5 seconds for improved transfer efficiency and safety.   Status Achieved   PT SHORT TERM GOAL #3   Title Pt will improve Functional Gait Assessment score to at least 18/30 for decreased fall risk.   Status Achieved   PT SHORT TERM GOAL #4    Title Pt will improve TUG score to less than or equal to 13.5 seconds for decreased fall risk.   Status Achieved   PT SHORT TERM GOAL #5   Title Pt will verbalize understanding of local Parkinson's-disease related resources.   Status Achieved           PT Long Term Goals - 01/29/15 0623    PT LONG TERM GOAL #1   Title Pt will verbalize understanding of fall prevention techniques within the home environment. (Target 01/30/15)   Status Achieved   PT LONG TERM GOAL #2   Title Pt will improve gait velocity to at least 2.62 ft/sec for improved gait efficiency and safety.   Baseline 2.48 ft/sec   Status Not Met   PT LONG TERM GOAL #3   Title Pt will improve Functional Gait Assessment to at least 22/30 for decreased fall risk.   Status Achieved   PT LONG TERM GOAL #4   Title Pt will improve TUG cognitive score to less than or equal to 15 seconds for decreased fall risk.   Status Achieved   PT LONG TERM GOAL #5   Title Pt will verbalize plans for continued community fitness upon D/C from PT.   Status Achieved               Plan - 01/29/15 1607    Clinical Impression Statement Pt met LTG # 4, but did not meet LTG #2.  Pt improved gait speed to 2.48 ft/sec.  Pt has demonstrated improvement in functional mobility throughout the course of therapy, though pt does continue to report having certain times of the day when movement is more difficult.  Pt is appropriate for D/C at this time.  Pt would benefit from return PT eval in approximately 6 months to reassess pt's functional mobility at that time.   Pt will benefit from skilled therapeutic intervention in order to improve on the following deficits Abnormal gait;Decreased activity tolerance;Decreased balance;Decreased endurance;Decreased mobility;Difficulty walking;Decreased strength;Improper body mechanics;Postural dysfunction   Rehab Potential Good   PT Frequency 2x / week   PT Duration 8 weeks   PT Next Visit Plan Discharge this  visit   Consulted and Agree with Plan of Care Patient        Problem List Patient Active Problem List   Diagnosis Date Noted  . Sinus disease 07/28/2014  . MDD (major depressive disorder), recurrent episode, severe 02/10/2014  . Nonspecific abnormal electrocardiogram (ECG) (EKG) 02/07/2014  . Non-compliant behavior 02/03/2014  . Other abnormal glucose 10/02/2013  . Morton's neuroma of left foot  08/07/2013  . Stress fracture of left foot 07/17/2013  . 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 Major depressive disorder, recurrent episode, severe 03/13/2012  . Hyperglycemia 01/26/2012  . Hypogonadism male 01/24/2012  . Syncope and collapse 01/20/2012  .  OSA (obstructive sleep apnea) 01/17/2011  . CHEST TIGHTNESS 02/18/2008  PHYSICAL THERAPY DISCHARGE SUMMARY  Visits from Start of Care: 17  Current functional level related to goals / functional outcomes:     PT Short Term Goals - 12/31/14 1025    PT SHORT TERM GOAL #1   Title Pt will be independent with HEP for improved transfers, balance, and gait. (Target 12/30/14)   Status Partially Met   PT SHORT TERM GOAL #2   Title Pt will improve 5x sit<>stand to less than or equal to 11.5 seconds for improved transfer efficiency and safety.   Status Achieved   PT SHORT TERM GOAL #3   Title Pt will improve Functional Gait Assessment score to at least 18/30 for decreased fall risk.   Status Achieved   PT SHORT TERM GOAL #4   Title Pt will improve TUG score to less than or equal to 13.5 seconds for decreased fall risk.   Status Achieved   PT SHORT TERM GOAL #5   Title Pt will verbalize understanding of local Parkinson's-disease related resources.   Status Achieved         PT Long Term Goals - 01/29/15 0347    PT LONG TERM GOAL #1   Title Pt will verbalize understanding of fall prevention techniques within the home environment. (Target 01/30/15)   Status Achieved   PT LONG TERM GOAL #2   Title Pt  will improve gait velocity to at least 2.62 ft/sec for improved gait efficiency and safety.   Baseline 2.48 ft/sec   Status Not Met   PT LONG TERM GOAL #3   Title Pt will improve Functional Gait Assessment to at least 22/30 for decreased fall risk.   Status Achieved   PT LONG TERM GOAL #4   Title Pt will improve TUG cognitive score to less than or equal to 15 seconds for decreased fall risk.   Status Achieved   PT LONG TERM GOAL #5   Title Pt will verbalize plans for continued community fitness upon D/C from PT.   Status Achieved       Remaining deficits: Bradykinesia, rigidity, decreased timing and coordination with gait (improved, however, pt continues to report some times of the day are more problematic for mobility.   Education / Equipment: Pt educated in HEP, fall prevention, techniques to reduce freezing, and community fitness activities.  Plan: Patient agrees to discharge.  Patient goals were not met. Patient is being discharged due to meeting the stated rehab goals.  ?????      Geisha Abernathy W. 01/29/2015, 4:11 PM  Frazier Butt., PT  [facility] [address] [place], [place], [address] Phone: [phone]   Fax:  [phone]

## 2015-01-29 NOTE — Therapy (Signed)
[facility] [address] [place], [place], [address] Phone: [phone]   Fax:  [phone]  Speech Language Pathology Treatment  Patient Details  Name: [name] [name] MRN: [id number] Date of Birth: May 05, 1951 Referring Provider:  Olga Millers, MD  Encounter Date: 01/29/2015      End of Session - 01/29/15 1312    Visit Number 1   Number of Visits 1   Date for SLP Re-Evaluation 01/29/15   SLP Start Time 0936   SLP Stop Time  1017   SLP Time Calculation (min) 41 min   Activity Tolerance Patient tolerated treatment well      Past Medical History  Diagnosis Date  . Headache(784.0)   . Cardiac murmur     as a child  . Streptococcal meningitis     as an infant  . Depression   .  OSA (obstructive sleep apnea) 01/17/2011    npsg 2012:  AHI 67/hr. Auto titration 2012:  Optimal pressure 12cm.   Marland Kitchen HEADACHES, HX OF 02/18/2008    Qualifier: Diagnosis of  By: Danny Lawless CMA, [place]    . APPENDECTOMY, HX OF 02/18/2008    Qualifier: Diagnosis of  By: Danny Lawless CMA, [place]    . Parkinson disease 11/2014    Past Surgical History  Procedure Laterality Date  . Nasal sinus surgery      x 4 as a child  . Vasectomy    . Appendectomy  1967    There were no vitals filed for this visit.  Visit Diagnosis: Hypokinetic Parkinsonian dysphonia - Plan: SLP plan of care cert/re-cert      Subjective Assessment - 01/29/15 0944    Subjective "I've been doing them (loud /a/)"               ADULT SLP TREATMENT - 01/29/15 0944    General Information   Behavior/Cognition Alert;Cooperative;Pleasant mood   Treatment Provided   Treatment provided Cognitive-Linquistic   Pain Assessment   Pain Assessment No/denies pain   Cognitive-Linquistic Treatment   Treatment focused on Dysarthria   Skilled Treatment Loud /a/ average 86dB. Sentence level responses with averagea 71dB consistently 20/20.    Assessment /  Recommendations / Plan   Plan Continue with current plan of care   Progression Toward Goals   Progression toward goals Progressing toward goals            SLP Short Term Goals - 01/09/15 0819    SLP SHORT TERM GOAL #1   Title Pt will demonstrate abdominal breathing in structured simple conversation for 10 minutes with occassional minimal cues.12/29/14   Time --   Period --   Status Not Met   SLP SHORT TERM GOAL #2   Title Pt will demonstrate average of 84dB for sustained /a/ over 3 sessions with rare minimal assistance (12/29/14)   Time --   Period --   Status Not Met   SLP SHORT TERM GOAL #3   Title Pt will average 70dB in simple structured speech tasks over 20 minutes with occassional minimal assistance (12/29/14)   Status Achieved          SLP Long Term Goals - 01/29/15 1018    SLP LONG TERM GOAL #1   Title Demonstrate abdominal breathing for mildly complex conversation for 15 minutes with occassional minimal cues (01/29/15)   Status Achieved   SLP LONG TERM GOAL #2   Title Pt will maintain an average of 70dBfor modcomplex conversation over 15 minutes  with rare minimal assistance over 3 sessions (01/29/15)   Status Achieved  goal renewed for today only - requiring MD auth for one visit   SLP LONG TERM GOAL #3   Title Pt will converse audibly in noisy environment for 15 minutes with rare minimal assistance (01/29/15)   Time 1   Status Achieved          Plan - 01/29/15 1313    Clinical Impression Statement Pt has met all LTGs and is satisfied wiht progress. Discharge pt appropriate at thist time - renewed for one session (today only).     SPEECH THERAPY DISCHARGE SUMMARY  Visits from Start of Care: 17  Current functional level related to goals / functional outcomes: Pt has met all LTGs and is pleased with progress in therapy. His speech loudness has improved considerably and he has reported family has positively commented on his loudness. He has been trained to  complete loud /a/ which should help loud speech to persist post-therapy course.   Remaining deficits: None    Education / Equipment: Loud /a/. Appropriate breathing. Plan: Patient agrees to discharge.  Patient goals were met. Patient is being discharged due to meeting the stated rehab goals.  ?????         Problem List Patient Active Problem List   Diagnosis Date Noted  . Sinus disease 07/28/2014  . MDD (major depressive disorder), recurrent episode, severe 02/10/2014  . Nonspecific abnormal electrocardiogram (ECG) (EKG) 02/07/2014  . Non-compliant behavior 02/03/2014  . Other abnormal glucose 10/02/2013  . Morton's neuroma of left foot 08/07/2013  . Stress fracture of left foot 07/17/2013  . 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 Major depressive disorder, recurrent episode, severe 03/13/2012  . Hyperglycemia 01/26/2012  . Hypogonadism male 01/24/2012  . Syncope and collapse 01/20/2012  .  OSA (obstructive sleep apnea) 01/17/2011  . CHEST TIGHTNESS 02/18/2008    Garald Balding, SLP 01/29/2015, 1:15 PM  [facility] [address] [place], [place], [address] Phone: [phone]   Fax:  [phone]

## 2015-02-03 ENCOUNTER — Encounter: Payer: Self-pay | Admitting: Occupational Therapy

## 2015-02-03 ENCOUNTER — Ambulatory Visit: Payer: Self-pay | Admitting: Physical Therapy

## 2015-02-04 ENCOUNTER — Ambulatory Visit (HOSPITAL_COMMUNITY): Payer: Self-pay | Admitting: Psychiatry

## 2015-02-05 ENCOUNTER — Ambulatory Visit (HOSPITAL_COMMUNITY): Payer: Self-pay | Admitting: Psychiatry

## 2015-02-06 ENCOUNTER — Encounter (HOSPITAL_COMMUNITY): Payer: Self-pay | Admitting: Psychiatry

## 2015-02-06 ENCOUNTER — Encounter: Payer: Self-pay | Admitting: Occupational Therapy

## 2015-02-06 ENCOUNTER — Ambulatory Visit (INDEPENDENT_AMBULATORY_CARE_PROVIDER_SITE_OTHER): Payer: BLUE CROSS/BLUE SHIELD | Admitting: Psychiatry

## 2015-02-06 ENCOUNTER — Ambulatory Visit: Payer: Self-pay | Admitting: Physical Therapy

## 2015-02-06 VITALS — BP 111/70 | HR 71 | Ht 76.0 in | Wt 257.2 lb

## 2015-02-06 DIAGNOSIS — F332 Major depressive disorder, recurrent severe without psychotic features: Secondary | ICD-10-CM

## 2015-02-06 DIAGNOSIS — G3184 Mild cognitive impairment, so stated: Secondary | ICD-10-CM | POA: Diagnosis not present

## 2015-02-06 MED ORDER — BUPROPION HCL ER (XL) 300 MG PO TB24: 300.0000 mg | ORAL_TABLET | Freq: Every morning | ORAL | Status: DC

## 2015-02-06 MED ORDER — CITALOPRAM HYDROBROMIDE 40 MG PO TABS: 40.0000 mg | ORAL_TABLET | Freq: Every day | ORAL | Status: DC

## 2015-02-06 NOTE — Progress Notes (Signed)
[facility] Progress Note  EIVAN GALLINA [id number] 64 y.o.  02/06/2015 9:33 AM  Chief Complaint:   Medication management and follow-up.           History of Present Illness:  [name] [name] came with his wife for his followup appointment.   He is taking Celexa and Wellbutrin and denies any side effects.  Overall he described his mood is improved from the past.  He still have decreased energy but his tremors are improved from the past.  He is scheduled to see Dr. [name] Tat for his follow-up appointment.  He is happy because he is working evening shift for past one month and he has seen much improvement since he switched the job.  He is sleeping at least 8-9 hours every night.  He is planning to get retirement next year.  He admitted some time issues with his wife due to financial strain that overall there is no irritability, anger or any mood swings.  He denies any paranoia or any hallucination.   He has gained a few more pounds from the last visit and he admitted increase in his sugar intake. He denies drinking or using any illegal substances.   His vitals are stable.  Patient is working at [employer] for many years.  Suicidal Ideation: No Plan Formed: No Patient has means to carry out plan: No  Homicidal Ideation: No Plan Formed: No Patient has means to carry out plan: No  Review of Systems  Constitutional: Positive for malaise/fatigue. Negative for weight loss.  HENT: Negative.   Neurological: Negative for dizziness.   Psychiatric: Agitation: No Hallucination: No Depressed Mood: No Insomnia: No Hypersomnia: Yes Altered Concentration: No Feels Worthless: No Grandiose Ideas: No Belief In Special Powers: No New/Increased Substance Abuse: No Compulsions: No  Neurologic: Headache: No Seizure: No Paresthesias: No  Past Medical History: Hypogonadism  Social history. Patient is employed and married.  He works third shift a Insurance claims handler.  He has 3  children.  Outpatient Encounter Prescriptions as of 02/06/2015  Medication Sig  . B Complex-C (B-COMPLEX WITH VITAMIN C) tablet Take 1 tablet by mouth daily.  Marland Kitchen buPROPion (WELLBUTRIN XL) 300 MG 24 hr tablet Take 1 tablet (300 mg total) by mouth every morning.  . citalopram (CELEXA) 40 MG tablet Take 1 tablet (40 mg total) by mouth daily.  . Omega 3-6-9 Fatty Acids (OMEGA 3-6-9 PO) Take by mouth.  . pramipexole (MIRAPEX) 0.125 MG tablet Take 1 tablet TID for one week, then 2 tablets TID for one week, then switch to 0.5 mg tablets  . pramipexole (MIRAPEX) 0.5 MG tablet Take 1 tablet (0.5 mg total) by mouth 3 (three) times daily.  Marland Kitchen rOPINIRole (REQUIP) 1 MG tablet Take 1 tablet (1 mg total) by mouth 3 (three) times daily.  . TURMERIC CURCUMIN PO Take by mouth.  . [DISCONTINUED] buPROPion (WELLBUTRIN XL) 300 MG 24 hr tablet Take 1 tablet (300 mg total) by mouth every morning.  . [DISCONTINUED] citalopram (CELEXA) 40 MG tablet Take 1 tablet (40 mg total) by mouth daily.   No facility-administered encounter medications on file as of 02/06/2015.    Past Psychiatric History/Hospitalization(s): Patient denies any history of suicidal attempt or any inpatient psychiatric treatment.  He denies any history of mania, psychosis, paranoia or any hallucination.  In the past he had tried Effexor, Abilify, Paxil, Cymbalta, Zoloft, amitriptyline, Pristiq, Britnellex, Celexa, Deplin and Vyvanse.  He is seen in this office since September 2009.  He has  seen multiple psychiatrists in the past.  Anxiety: No Bipolar Disorder: No Depression: Yes Mania: No Psychosis: No Schizophrenia: No Personality Disorder: No Hospitalization for psychiatric illness: No History of Electroconvulsive Shock Therapy: No Prior Suicide Attempts: No  Physical Exam: Constitutional:  BP 111/70 mmHg  Pulse 71  Ht 6\' 4"  (1.93 m)  Wt 257 lb 3.2 oz (116.665 kg)  BMI 31.32 kg/m2  No results found for this or any previous visit (from the  past 2160 hour(s)).  General Appearance: well nourished  Musculoskeletal: Strength & Muscle Tone: decreased Gait & Station: unsteady Patient leans: N/A   Mental Status Examination: Patient is casually dressed and fairly groomed.  He maintained fair eye contact.  His speech is slow with decreased volume and tone.  His response to the question is slow.  His thought process is slow but coherent.  His affect is flat.  He denies any auditory or visual hallucination.  He denies any active or passive suicidal thoughts and homicidal thoughts.  There were no delusions or any paranoia.  His attention and concentration is fair. He described his mood  euthymic and his affect is flat.  He has some difficulty remembering things but he is alert and oriented 3.  His psychomotor activity is decreased.  His fund of knowledge is average.  His insight judgment and impulse control is okay.  Established Problem, Stable/Improving (1), Review of Last Therapy Session (1) and Review of Medication Regimen & Side Effects (2)  Assessment: Axis I: Maj. depressive disorder, recurrent, severe cognitive disorder NOS ,   Axis II: Deferred  Axis III: Hypogonadism, Parkinson   Plan:  Patient is  stable on Wellbutrin and Celexa.  He has no side effects.  He is scheduled to see his neurologist for his follow-up appointment.  He is happy that he is taking his medication and working second shift. His memory impairment is stable.  Recommended to call us back if he has any question, concern or if he feel worsening of the symptom.  Follow-up in 3 months.  Benita Boonstra T., MD 02/06/2015

## 2015-02-10 ENCOUNTER — Encounter: Payer: Self-pay | Admitting: Occupational Therapy

## 2015-02-10 ENCOUNTER — Ambulatory Visit: Payer: Self-pay | Admitting: Physical Therapy

## 2015-02-12 ENCOUNTER — Encounter: Payer: Self-pay | Admitting: Occupational Therapy

## 2015-02-12 ENCOUNTER — Ambulatory Visit: Payer: Self-pay | Admitting: Physical Therapy

## 2015-02-18 ENCOUNTER — Ambulatory Visit (HOSPITAL_COMMUNITY): Payer: Self-pay | Admitting: Psychiatry

## 2015-02-19 ENCOUNTER — Ambulatory Visit (INDEPENDENT_AMBULATORY_CARE_PROVIDER_SITE_OTHER): Payer: Federal, State, Local not specified - PPO | Admitting: Neurology

## 2015-02-19 ENCOUNTER — Encounter: Payer: Self-pay | Admitting: Neurology

## 2015-02-19 ENCOUNTER — Telehealth: Payer: Self-pay | Admitting: Neurology

## 2015-02-19 VITALS — BP 110/80 | HR 68 | Ht 74.0 in | Wt 259.0 lb

## 2015-02-19 DIAGNOSIS — G20 Parkinson's disease: Secondary | ICD-10-CM | POA: Diagnosis not present

## 2015-02-19 DIAGNOSIS — F332 Major depressive disorder, recurrent severe without psychotic features: Secondary | ICD-10-CM | POA: Diagnosis not present

## 2015-02-19 DIAGNOSIS — R413 Other amnesia: Secondary | ICD-10-CM

## 2015-02-19 MED ORDER — ROPINIROLE HCL 1 MG PO TABS: ORAL_TABLET | ORAL | Status: DC

## 2015-02-19 NOTE — Patient Instructions (Signed)
1. Increase Requip to 2 tablets in the morning, 1 in the afternoon, 2 in the evening. Prescription sent to your pharmacy.  2. You have been referred to Dr Nathanial Rancher for Neuro Psych testing. They will call you directly to schedule an appointment. Please call [phone] if you do not hear from them.

## 2015-02-19 NOTE — Progress Notes (Signed)
[name] [name] was seen today in the movement disorders clinic for neurologic consultation at the request of Olga Millers, MD.  The consultation is for the evaluation of slowness, flat affect and to r/o a neurologic disorder.  This patient is accompanied in the office by his spouse who supplements the history.  The first symptom(s) the patient noticed was a feeling of weakness that has been going on for about a year.  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  For about a year and a half he has noted a "body" tremor and his wife has noted a hand tremor in both hands (at rest).  He is stiff like he is "a 64 year old man."  Once he is in bed he finds it is hard to move.  The patient has a hx of significant depression and is on multiple medications related to this.  He was started on cogentin just yesterday, but is also on celexa, wellbutrin and latuda.  He has been on [place] for 1.5 months.  Prior to that he was on abilify (tried on 2 separate times but most recently was only on it for a day) and geodon(doesn't remember this but looks like it may have been given in the past).  He has been on risperdal but cannot remember how long ago.  ECT was offered but his wife does not want him to proceed with that  11/20/14 update:  The patient returns today for follow-up.  He is accompanied by his wife who supplements the history.  He has been off of [place] for about 2 weeks.  Unfortunately, he was unable to afford the dat scan.  He is on just Wellbutrin and Celexa for depression and his Cogentin and has been discontinued.  He has noted a decreased appetite and dry mouth and having trouble sleeping because of stiffness.  Pt states that he is almost afraid to go to sleep.  Missing 2 days of work per week (works nights) but a lot of that because of anxiety.  His wife notices increased tremor.  He has slow at work.  Admits to some  lightheadedness but no syncope.  Note diplopia.  No hallucinations.  His wife is doing most of the driving because he is having difficulty staying in his own lane.  02/19/15 update:  The patient is following up today regarding his parkinsonism.  I reviewed records from his psychiatrist since last visit.  His psychiatry note from 12/04/14 indicates that the patient stated that tremor was better.  However, I received a call 4 days later stating that tremor was worse and he thought it was from the Mirapex.  He wanted to change the Mirapex to something else.  He was on Mirapex 0.5 mg 3 times per day at the time.  We ended up switching it to Requip 1 mg 3 times a day.  Fortunately he is doing better.  He denies any side effects with the Requip.  He currently takes it at 8 AM/2 PM/8 PM.  He does state that he notices that he "shuffles with the left leg."  He has not fallen but sometimes feels off balance.  He states that he finished his physical/occupational/speech therapy and feels that it went well.  He is not exercising on his own.  He initially states that he does not feel that he has time, but also states that he does not  go into work until 2 PM and works until 10:30 PM.  This is a change for him.  He was able to change his job at [employer] and seems like that is doing better.  His wife mentions that he continues to have memory loss that she thinks has increased, but she also states that she has noticed memory loss ever since starting on antidepressants.  He had one fall since last visit.  This was when he was taking the trash to the street.  He had no fractures with this, just a superficial scrape.    Neuroimaging has previously been performed.  It is available for my review today and I reviewed it with him.  There is no BG disease.  ALLERGIES:  No Known Allergies  CURRENT MEDICATIONS:  Outpatient Encounter Prescriptions as of 02/19/2015  Medication Sig  . B Complex-C (B-COMPLEX WITH VITAMIN C) tablet  Take 1 tablet by mouth daily.  Marland Kitchen buPROPion (WELLBUTRIN XL) 300 MG 24 hr tablet Take 1 tablet (300 mg total) by mouth every morning.  . citalopram (CELEXA) 40 MG tablet Take 1 tablet (40 mg total) by mouth daily.  . Omega 3-6-9 Fatty Acids (OMEGA 3-6-9 PO) Take by mouth.  Marland Kitchen rOPINIRole (REQUIP) 1 MG tablet Take 1 tablet (1 mg total) by mouth 3 (three) times daily.  . TURMERIC CURCUMIN PO Take by mouth.  . [DISCONTINUED] pramipexole (MIRAPEX) 0.125 MG tablet Take 1 tablet TID for one week, then 2 tablets TID for one week, then switch to 0.5 mg tablets  . [DISCONTINUED] pramipexole (MIRAPEX) 0.5 MG tablet Take 1 tablet (0.5 mg total) by mouth 3 (three) times daily.   No facility-administered encounter medications on file as of 02/19/2015.    PAST MEDICAL HISTORY:   Past Medical History  Diagnosis Date  . Headache(784.0)   . Cardiac murmur     as a child  . Streptococcal meningitis     as an infant  . Depression   .  OSA (obstructive sleep apnea) 01/17/2011    npsg 2012:  AHI 67/hr. Auto titration 2012:  Optimal pressure 12cm.   Marland Kitchen HEADACHES, HX OF 02/18/2008    Qualifier: Diagnosis of  By: Danny Lawless CMA, [place]    . APPENDECTOMY, HX OF 02/18/2008    Qualifier: Diagnosis of  By: Danny Lawless CMA, [place]    . Parkinson disease 11/2014    PAST SURGICAL HISTORY:   Past Surgical History  Procedure Laterality Date  . Nasal sinus surgery      x 4 as a child  . Vasectomy    . Appendectomy  1967    SOCIAL HISTORY:   History   Social History  . Marital Status: Married    Spouse Name: N/A  . Number of Children: Y  . Years of Education: N/A   Occupational History  . 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 Smoking status: Never Smoker   . Smokeless tobacco: Never Used  . Alcohol Use: No  . Drug Use: No  . Sexual Activity: Yes    Birth Control/ Protection: None   Other Topics Concern  . Not on file   Social History Narrative    FAMILY HISTORY:     Family Status  Relation Status Death Age  . Father Deceased     lung cancer, alzheimer's  . Mother Alive     HTN, A fib  . Brother Deceased     accident  .  Sister Alive     healthy  . Son Alive     healthy  . Daughter Alive     healthy  . Daughter Alive     healthy  . Maternal Aunt Alive     Parkinson's Disease    ROS:  A complete 10 system review of systems was obtained and was unremarkable apart from what is mentioned above.  PHYSICAL EXAMINATION:    VITALS:   Filed Vitals:   02/19/15 1016  BP: 110/80  Pulse: 68  Height: 6\' 2"  (1.88 m)  Weight: 259 lb (117.482 kg)   Wt Readings from Last 3 Encounters:  02/19/15 259 lb (117.482 kg)  02/06/15 257 lb 3.2 oz (116.665 kg)  12/04/14 244 lb 9.6 oz (110.95 kg)     GEN:  The patient appears stated age and is in NAD. HEENT:  Normocephalic, atraumatic.  The mucous membranes are moist. The superficial temporal arteries are without ropiness or tenderness. CV:  RRR Lungs:  CTAB Neck/HEME:  There are no carotid bruits bilaterally.  Neurological examination:  Orientation: The patient is alert and oriented x3. Fund of knowledge is appropriate.  Recent and remote memory are intact.  Attention and concentration are normal.    Able to name objects and repeat phrases. Cranial nerves: There is good facial symmetry. 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  Pupils are equal round and reactive to light bilaterally. Fundoscopic exam reveals clear margins bilaterally. Extraocular muscles are intact. There are no square wave jerks.  The visual fields are full to confrontational testing. The speech is fluent and clear.  He is hypophonic.  The patient is able to make the gutteral sounds without difficulty. Soft palate rises symmetrically and there is no tongue deviation. Hearing is intact to conversational tone. Sensation: Sensation is intact to light and pinprick throughout (facial, trunk, extremities). Vibration is intact at the bilateral big  toe. There is no extinction with double simultaneous stimulation. There is no sensory dermatomal level identified. Motor: Strength is 5/5 in the bilateral upper and lower extremities.   Shoulder shrug is equal and symmetric.  There is no pronator drift. Deep tendon reflexes: Deep tendon reflexes are 2+/4 at the bilateral biceps, triceps, brachioradialis, patella and 1/4 at the bilateral achilles. Plantar responses are downgoing bilaterally.  Movement examination: Tone: There is normal tone in the left upper extremity but increased in the left lower extremity, overall mild to moderate. Abnormal movements: No tremor noted today even with distraction Coordination:  There is decremation with RAM's, seen with hand opening and closing on the left, finger taps on the left, alternation of supination/pronation on the left, heel taps and toe taps on the left. Gait and Station: The patient has no difficulty arising out of a deep-seated chair without the use of the hands.  He rocks to get out of the chair.   The patient's stride length is just slightly decreased with decreased arm swing on the left.  He is able to run down the hall.  His pull test was negative today.  ASSESSMENT/PLAN:  Parkinsonism   -While he certainly could have idiopathic PD, especially since he was only on the Latuda for 6-8 weeks and sx's preceded that, he was exposed to multiple other antipsychotics and I am unsure of the timeline of those exposures.  He has only been off of [place] since early February, and his examination had improved when he got off of Latuda, even before starting on Requip.  He is doing better on Requip,  but we decided to go up on it today.  He will increase his Requip to 1 mg and take 2 tablets in the morning, one in the afternoon and 2 in the evening and move the dosages closer together.  -Stressed to him the importance of safe, cardiovascular exercise. 2.  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  I will refer him for neuropsych testing.  I do not think that Requip is contributing, as this started prior to Requip. 3.  Depression  -The patient reports that this has been stable.  He is not suicidal or homicidal.  He will remain on Celexa and Wellbutrin and remain under the care of psychiatry. 4.  Follow-up with me will be in the next 3 months, sooner should new neurologic issues arise.

## 2015-02-19 NOTE — Telephone Encounter (Signed)
Referral faxed to Dr Karen Sullivan at [phone] with confirmation received. They will contact patient directly with appt.       

## 2015-02-20 ENCOUNTER — Ambulatory Visit (HOSPITAL_COMMUNITY): Payer: Self-pay | Admitting: Psychiatry

## 2015-02-20 ENCOUNTER — Telehealth: Payer: Self-pay | Admitting: *Deleted

## 2015-02-20 NOTE — Telephone Encounter (Signed)
Patient made aware. He will go back to previous dosage.

## 2015-02-20 NOTE — Telephone Encounter (Signed)
Patients wife called he is having a reaction to yesterdays increase in medication please advise Call back [phone] Joycelyn Schmid)

## 2015-02-20 NOTE — Telephone Encounter (Signed)
Spoke with patient and he states he took 2 Requip yesterday evening and felt overly exhausted, body aches, increase stiffness, headache, and chest discomfort. He also states he had an increase tremor in his hands. He was working last night during these symptoms and states he feels so bad that he can't work today. He did take the 2 Requip again this morning and doesn't have all these symptoms but does feel like the tremors are worse, he is still having body aches and he is extremely tired. Please advise.

## 2015-02-20 NOTE — Telephone Encounter (Signed)
Just go back to what he previously was taking then.  This really doesn't make sense why one would have more tremor/stiffness with med (same issue with mirapex) but since he felt doing okay before just back down on the dosage

## 2015-02-23 ENCOUNTER — Ambulatory Visit (INDEPENDENT_AMBULATORY_CARE_PROVIDER_SITE_OTHER): Payer: Self-pay | Admitting: Psychiatry

## 2015-02-23 DIAGNOSIS — F329 Major depressive disorder, single episode, unspecified: Secondary | ICD-10-CM

## 2015-02-23 DIAGNOSIS — F332 Major depressive disorder, recurrent severe without psychotic features: Secondary | ICD-10-CM

## 2015-02-23 NOTE — Psych (Signed)
   THERAPIST PROGRESS NOTE  Session Time: 1:00-2:05  Participation Level: Minimal  Behavioral Response: CasualAlertFlatAffect with Mildly improved mood  Type of Therapy: Individual Therapy  Treatment Goals addressed: Anxiety, Depression  Interventions: Solution Focused  Summary: [name] [name] is a 64 y.o. male who presents with major depressive disorder, panic disorder   Suicidal/Homicidal: Nowithout intent/plan  Therapist Response: Pt. Came to today's session by himself. Pt. Presents with improved mood, smiles and makes appropriate eye contact, continues to have primarily flat affect. Pt. Reports that he has successfully transitioned to new position and is much happier and sleep has improved due to new schedule. Pt. Reports that he has completed 36 rehabilitation sessions for parkinson's and has applied for fmla. Pt. Reports that wife is worried about finances, but he feels less stressed and mother is able to help them financially each month. Pt. Reports that daughter has received treatment for seizure disorder and is able to drive which has removed stress from family. Pt. Reported that he and family likely will not go on vacation, but discussed local ideas that would not cost a lot of money. Session focused on acceptance of parkinson's diagnosis and ongoing commitment to self-care and managing stress.   Plan: Continue with solution focused based therapy. Return again in 3-4 weeks.  Diagnosis:Axis I: Depressive Disorder NOS  Axis II: No diagnosis   Nancie Neas, [facility] 02/23/2015

## 2015-03-05 ENCOUNTER — Ambulatory Visit: Payer: Self-pay | Admitting: Internal Medicine

## 2015-03-16 ENCOUNTER — Other Ambulatory Visit (INDEPENDENT_AMBULATORY_CARE_PROVIDER_SITE_OTHER): Payer: Federal, State, Local not specified - PPO

## 2015-03-16 ENCOUNTER — Encounter: Payer: Self-pay | Admitting: Internal Medicine

## 2015-03-16 ENCOUNTER — Ambulatory Visit (INDEPENDENT_AMBULATORY_CARE_PROVIDER_SITE_OTHER): Payer: Federal, State, Local not specified - PPO | Admitting: Internal Medicine

## 2015-03-16 VITALS — BP 114/70 | HR 65 | Temp 97.9°F | Resp 16 | Wt 254.0 lb

## 2015-03-16 DIAGNOSIS — F332 Major depressive disorder, recurrent severe without psychotic features: Secondary | ICD-10-CM

## 2015-03-16 DIAGNOSIS — R739 Hyperglycemia, unspecified: Secondary | ICD-10-CM

## 2015-03-16 LAB — LIPID PANEL
CHOLESTEROL: 180 mg/dL (ref 0–200)
HDL: 56.8 mg/dL (ref >39.00)
LDL Cholesterol: 105 mg/dL — ABNORMAL HIGH (ref 0–99)
NONHDL: 123.2
Total CHOL/HDL Ratio: 3
Triglycerides: 89 mg/dL (ref 0.0–149.0)
VLDL: 17.8 mg/dL (ref 0.0–40.0)

## 2015-03-16 LAB — COMPREHENSIVE METABOLIC PANEL
ALK PHOS: 63 U/L (ref 39–117)
ALT: 13 U/L (ref 0–53)
AST: 15 U/L (ref 0–37)
Albumin: 4.2 g/dL (ref 3.5–5.2)
BUN: 14 mg/dL (ref 6–23)
CO2: 28 meq/L (ref 19–32)
Calcium: 9.4 mg/dL (ref 8.4–10.5)
Chloride: 103 mEq/L (ref 96–112)
Creatinine, Ser: 1.12 mg/dL (ref 0.40–1.50)
GFR: 70.69 mL/min (ref >60.00)
Glucose, Bld: 94 mg/dL (ref 70–99)
Potassium: 4.3 mEq/L (ref 3.5–5.1)
SODIUM: 137 meq/L (ref 135–145)
TOTAL PROTEIN: 7.3 g/dL (ref 6.0–8.3)
Total Bilirubin: 0.6 mg/dL (ref 0.2–1.2)

## 2015-03-16 LAB — HEMOGLOBIN A1C: Hgb A1c MFr Bld: 5.8 % (ref 4.6–6.5)

## 2015-03-16 NOTE — Assessment & Plan Note (Signed)
Checking HgA1c today for his mental health medications and increased BMI.

## 2015-03-16 NOTE — Patient Instructions (Signed)
We will follow up on the results of your testing.   We are going to check your blood today and call you back with the results.   Come back in about 6 months or so, if you have new problems or questions please feel free to call us back.   Fall Prevention and Home Safety Falls cause injuries and can affect all age groups. It is possible to use preventive measures to significantly decrease the likelihood of falls. There are many simple measures which can make your home safer and prevent falls. OUTDOORS  Repair cracks and edges of walkways and driveways.  Remove high doorway thresholds.  Trim shrubbery on the main path into your home.  Have good outside lighting.  Clear walkways of tools, rocks, debris, and clutter.  Check that handrails are not broken and are securely fastened. Both sides of steps should have handrails.  Have leaves, snow, and ice cleared regularly.  Use sand or salt on walkways during winter months.  In the garage, clean up grease or oil spills. BATHROOM  Install night lights.  Install grab bars by the toilet and in the tub and shower.  Use non-skid mats or decals in the tub or shower.  Place a plastic non-slip stool in the shower to sit on, if needed.  Keep floors dry and clean up all water on the floor immediately.  Remove soap buildup in the tub or shower on a regular basis.  Secure bath mats with non-slip, double-sided rug tape.  Remove throw rugs and tripping hazards from the floors. BEDROOMS  Install night lights.  Make sure a bedside light is easy to reach.  Do not use oversized bedding.  Keep a telephone by your bedside.  Have a firm chair with side arms to use for getting dressed.  Remove throw rugs and tripping hazards from the floor. KITCHEN  Keep handles on pots and pans turned toward the center of the stove. Use back burners when possible.  Clean up spills quickly and allow time for drying.  Avoid walking on wet  floors.  Avoid hot utensils and knives.  Position shelves so they are not too high or low.  Place commonly used objects within easy reach.  If necessary, use a sturdy step stool with a grab bar when reaching.  Keep electrical cables out of the way.  Do not use floor polish or wax that makes floors slippery. If you must use wax, use non-skid floor wax.  Remove throw rugs and tripping hazards from the floor. STAIRWAYS  Never leave objects on stairs.  Place handrails on both sides of stairways and use them. Fix any loose handrails. Make sure handrails on both sides of the stairways are as long as the stairs.  Check carpeting to make sure it is firmly attached along stairs. Make repairs to worn or loose carpet promptly.  Avoid placing throw rugs at the top or bottom of stairways, or properly secure the rug with carpet tape to prevent slippage. Get rid of throw rugs, if possible.  Have an electrician put in a light switch at the top and bottom of the stairs. OTHER FALL PREVENTION TIPS  Wear low-heel or rubber-soled shoes that are supportive and fit well. Wear closed toe shoes.  When using a stepladder, make sure it is fully opened and both spreaders are firmly locked. Do not climb a closed stepladder.  Add color or contrast paint or tape to grab bars and handrails in your home. Place contrasting color  strips on first and last steps.  Learn and use mobility aids as needed. Install an electrical emergency response system.  Turn on lights to avoid dark areas. Replace light bulbs that burn out immediately. Get light switches that glow.  Arrange furniture to create clear pathways. Keep furniture in the same place.  Firmly attach carpet with non-skid or double-sided tape.  Eliminate uneven floor surfaces.  Select a carpet pattern that does not visually hide the edge of steps.  Be aware of all pets. OTHER HOME SAFETY TIPS  Set the water temperature for 120 F (48.8 C).  Keep  emergency numbers on or near the telephone.  Keep smoke detectors on every level of the home and near sleeping areas. Document Released: 09/09/2002 Document Revised: 03/20/2012 Document Reviewed: 12/09/2011 [facility] Patient Information 2015 [place], [place]. This information is not intended to replace advice given to you by your health care provider. Make sure you discuss any questions you have with your health care provider.

## 2015-03-16 NOTE — Progress Notes (Signed)
   Subjective:    Patient ID: [name] [name], male    DOB: [birth year], 64 y.o.   MRN: [id number]  HPI The patient is a 64 YO man who is coming in to follow up on her depression and parkinson's. Since last visit he has changed jobs and has better hours now but is more physically demanding and he is not able to do this as well which is frustrating to him. He is taking medicine for his parkinson's and feels this is helping but he feels weak overall and feels that he is losing some strength. His depression is okay and he feels that this is controlled. He has gotten off latuda since last visit. His wife is not with him today. He is going to do a neuro-psych evaluation this week for some concern of his wife's about memory problems. He is not sure it is a large problem although there is alzheimer's in the family which worries him.   Review of Systems  Constitutional: Negative for fever, chills, activity change, appetite change and fatigue.  HENT: Negative for ear pain, facial swelling, sinus pressure, sneezing and sore throat.   Respiratory: Negative for cough, chest tightness, shortness of breath and wheezing.   Cardiovascular: Negative for chest pain, palpitations and leg swelling.  Gastrointestinal: Negative for abdominal pain, diarrhea, constipation and abdominal distention.  Neurological: Positive for weakness.  Psychiatric/Behavioral: Positive for dysphoric mood and decreased concentration.      Objective:   Physical Exam  Constitutional: He appears well-developed and well-nourished. No distress.  Flat affect  HENT:  Head: Normocephalic and atraumatic.  Mouth/Throat: No oropharyngeal exudate.  Eyes: EOM are normal.  Neck: Normal range of motion.  Cardiovascular: Normal rate and regular rhythm.   Pulmonary/Chest: Effort normal and breath sounds normal.  Abdominal: Soft. Bowel sounds are normal.  Skin: Skin is warm and dry.  Psychiatric:  Less flat affect than last visit.    Filed  Vitals:   03/16/15 0838  BP: 114/70  Pulse: 65  Temp: 97.9 F (36.6 C)  TempSrc: Oral  Resp: 16  Weight: 254 lb (115.214 kg)  SpO2: 95%      Assessment & Plan:

## 2015-03-16 NOTE — Progress Notes (Signed)
Pre visit review using our clinic review tool, if applicable. No additional management support is needed unless otherwise documented below in the visit note. 

## 2015-03-16 NOTE — Assessment & Plan Note (Signed)
Unclear to me if he is well controlled with his celexa and wellbutrin. He seems to think he is improved but affect still slightly flat today. Seeing psychiatry.

## 2015-03-18 ENCOUNTER — Telehealth: Payer: Self-pay | Admitting: Internal Medicine

## 2015-03-18 NOTE — Telephone Encounter (Signed)
Patient received his lab results through [facility] and it shows some active problems of obstructive sleep apnea, chest tightness, hyperglycemia, hypogonadism, morton's neuroma of the left foot, non compliant behavior, abnormal electrocardiogram, syncope & collapse. These should be taken off of his chart. He stated that only thing that should be on there is depressive disorder and parkinson disease.

## 2015-03-18 NOTE — Telephone Encounter (Signed)
Can discuss at next office visit 

## 2015-03-30 ENCOUNTER — Other Ambulatory Visit (HOSPITAL_COMMUNITY): Payer: Self-pay | Admitting: Psychiatry

## 2015-03-30 ENCOUNTER — Ambulatory Visit (HOSPITAL_COMMUNITY): Payer: Self-pay | Admitting: Psychiatry

## 2015-04-27 ENCOUNTER — Ambulatory Visit (HOSPITAL_COMMUNITY): Payer: Self-pay | Admitting: Psychiatry

## 2015-05-11 ENCOUNTER — Ambulatory Visit (HOSPITAL_COMMUNITY): Payer: Self-pay | Admitting: Psychiatry

## 2015-05-25 ENCOUNTER — Ambulatory Visit (INDEPENDENT_AMBULATORY_CARE_PROVIDER_SITE_OTHER): Payer: Federal, State, Local not specified - PPO | Admitting: Neurology

## 2015-05-25 ENCOUNTER — Ambulatory Visit (HOSPITAL_COMMUNITY): Payer: Self-pay | Admitting: Psychiatry

## 2015-05-25 ENCOUNTER — Encounter: Payer: Self-pay | Admitting: Neurology

## 2015-05-25 VITALS — BP 108/64 | HR 73 | Ht 74.0 in | Wt 264.0 lb

## 2015-05-25 DIAGNOSIS — F332 Major depressive disorder, recurrent severe without psychotic features: Secondary | ICD-10-CM

## 2015-05-25 DIAGNOSIS — G3184 Mild cognitive impairment, so stated: Secondary | ICD-10-CM

## 2015-05-25 DIAGNOSIS — G20 Parkinson's disease: Secondary | ICD-10-CM

## 2015-05-25 MED ORDER — ROPINIROLE HCL ER 6 MG PO TB24: 1.0000 | ORAL_TABLET | Freq: Every day | ORAL | Status: DC

## 2015-05-25 NOTE — Progress Notes (Signed)
[name] [name] was seen today in the movement disorders clinic for neurologic consultation at the request of Olga Millers, MD.  The consultation is for the evaluation of slowness, flat affect and to r/o a neurologic disorder.  This patient is accompanied in the office by his spouse who supplements the history.  The first symptom(s) the patient noticed was a feeling of weakness that has been going on for about a year.  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  For about a year and a half he has noted a "body" tremor and his wife has noted a hand tremor in both hands (at rest).  He is stiff like he is "a 64 year old man."  Once he is in bed he finds it is hard to move.  The patient has a hx of significant depression and is on multiple medications related to this.  He was started on cogentin just yesterday, but is also on celexa, wellbutrin and latuda.  He has been on [place] for 1.5 months.  Prior to that he was on abilify (tried on 2 separate times but most recently was only on it for a day) and geodon(doesn't remember this but looks like it may have been given in the past).  He has been on risperdal but cannot remember how long ago.  ECT was offered but his wife does not want him to proceed with that  11/20/14 update:  The patient returns today for follow-up.  He is accompanied by his wife who supplements the history.  He has been off of [place] for about 2 weeks.  Unfortunately, he was unable to afford the dat scan.  He is on just Wellbutrin and Celexa for depression and his Cogentin and has been discontinued.  He has noted a decreased appetite and dry mouth and having trouble sleeping because of stiffness.  Pt states that he is almost afraid to go to sleep.  Missing 2 days of work per week (works nights) but a lot of that because of anxiety.  His wife notices increased tremor.  He has slow at work.  Admits to some  lightheadedness but no syncope.  Note diplopia.  No hallucinations.  His wife is doing most of the driving because he is having difficulty staying in his own lane.  02/19/15 update:  The patient is following up today regarding his parkinsonism.  I reviewed records from his psychiatrist since last visit.  His psychiatry note from 12/04/14 indicates that the patient stated that tremor was better.  However, I received a call 4 days later stating that tremor was worse and he thought it was from the Mirapex.  He wanted to change the Mirapex to something else.  He was on Mirapex 0.5 mg 3 times per day at the time.  We ended up switching it to Requip 1 mg 3 times a day.  Fortunately he is doing better.  He denies any side effects with the Requip.  He currently takes it at 8 AM/2 PM/8 PM.  He does state that he notices that he "shuffles with the left leg."  He has not fallen but sometimes feels off balance.  He states that he finished his physical/occupational/speech therapy and feels that it went well.  He is not exercising on his own.  He initially states that he does not feel that he has time, but also states that he does not  go into work until 2 PM and works until 10:30 PM.  This is a change for him.  He was able to change his job at [employer] and seems like that is doing better.  His wife mentions that he continues to have memory loss that she thinks has increased, but she also states that she has noticed memory loss ever since starting on antidepressants.  He had one fall since last visit.  This was when he was taking the trash to the street.  He had no fractures with this, just a superficial scrape.    05/25/15 update:  The patient presents today, accompanied by his wife who supplements the history.  His Requip was increased last visit so that was taking 2 tablets in the morning, one in afternoon and 2 in the evening and 1 day after I started this, I received a call from his wife that the patient did not  tolerate it and it made symptoms worse (which is what they reported with Mirapex).  While that was very odd to me, I told them to go back down to 1 mg 3 times per day.  He actually comes back on 1 mg, 1 in the AM, 2 in the afternoon and 1 in the evening.  He reports that he is doing worse.  He has more tremor and he feels tired in the evening.  Pts wife asks about trying to go back up on the requip slowly.   He had neuropsych testing in June at [facility].  I reviewed that.  There was no evidence of dementia.  There was evidence of mild cognitive impairment from Parkinson's disease as well as from chronic major depressive disorder.  It is recommended that the patient consider transcranial magnetic stimulation for depression, and he told the neuropsychologist that he considered in the past and opted against it.  She encouraged him to attend a Parkinson's support group and he told her that he tried that once and it was not helpful.  He still c/o "my memory has been terrible and especially over the last few weeks."  He feels that he is not depressed - "that is well controlled."  He is not doing CV exercise.  He is still in his new job but it seems more physical than his old job and he thinks he is not as fast in getting tasks done.  He asks about DBS.  Neuroimaging has previously been performed.  It is available for my review today and I reviewed it with him.  There is no BG disease.  ALLERGIES:  No Known Allergies  CURRENT MEDICATIONS:  Outpatient Encounter Prescriptions as of 05/25/2015  Medication Sig  . B Complex-C (B-COMPLEX WITH VITAMIN C) tablet Take 1 tablet by mouth daily.  Marland Kitchen buPROPion (WELLBUTRIN XL) 300 MG 24 hr tablet Take 1 tablet (300 mg total) by mouth every morning.  . citalopram (CELEXA) 40 MG tablet Take 1 tablet (40 mg total) by mouth daily.  . Omega 3-6-9 Fatty Acids (OMEGA 3-6-9 PO) Take by mouth.  Marland Kitchen rOPINIRole (REQUIP) 1 MG tablet Take 2 tablets in the morning, 1 tablet in the  afternoon, 2 in the evening. (Patient taking differently: Take by mouth. 1 tablet in the morning, 2 in the afternoon, 1 in the evening)  . TURMERIC CURCUMIN PO Take by mouth.   No facility-administered encounter medications on file as of 05/25/2015.    PAST MEDICAL HISTORY:   Past Medical History  Diagnosis Date  .  Headache(784.0)   . Cardiac murmur     as a child  . Streptococcal meningitis     as an infant  . Depression   .  OSA (obstructive sleep apnea) 01/17/2011    npsg 2012:  AHI 67/hr. Auto titration 2012:  Optimal pressure 12cm.   Marland Kitchen HEADACHES, HX OF 02/18/2008    Qualifier: Diagnosis of  By: Danny Lawless CMA, [place]    . APPENDECTOMY, HX OF 02/18/2008    Qualifier: Diagnosis of  By: Danny Lawless CMA, [place]    . Parkinson disease 11/2014    PAST SURGICAL HISTORY:   Past Surgical History  Procedure Laterality Date  . Nasal sinus surgery      x 4 as a child  . Vasectomy    . Appendectomy  1967    SOCIAL HISTORY:   Social History   Social History  . Marital Status: Married    Spouse Name: N/A  . Number of Children: Y  . Years of Education: N/A   Occupational History  . 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 Smoking status: Never Smoker   . Smokeless tobacco: Never Used  . Alcohol Use: No  . Drug Use: No  . Sexual Activity: Yes    Birth Control/ Protection: None   Other Topics Concern  . Not on file   Social History Narrative    FAMILY HISTORY:   Family Status  Relation Status Death Age  . Father Deceased     lung cancer, alzheimer's  . Mother Alive     HTN, A fib  . Brother Deceased     accident  . Sister Alive     healthy  . Son Alive     healthy  . Daughter Alive     healthy  . Daughter Alive     healthy  . Maternal Aunt Alive     Parkinson's Disease    ROS:  A complete 10 system review of systems was obtained and was unremarkable apart from what is mentioned above.  PHYSICAL EXAMINATION:    VITALS:   Filed  Vitals:   05/25/15 1111  BP: 108/64  Pulse: 73  Height: 6\' 2"  (1.88 m)  Weight: 264 lb (119.75 kg)   Wt Readings from Last 3 Encounters:  05/25/15 264 lb (119.75 kg)  03/16/15 254 lb (115.214 kg)  02/19/15 259 lb (117.482 kg)     GEN:  The patient appears stated age and is in NAD. HEENT:  Normocephalic, atraumatic.  The mucous membranes are moist. The superficial temporal arteries are without ropiness or tenderness. CV:  RRR Lungs:  CTAB Neck/HEME:  There are no carotid bruits bilaterally.  Neurological examination:  Orientation: The patient is alert and oriented x3. Fund of knowledge is appropriate.  Recent and remote memory are intact.  Attention and concentration are normal.    Able to name objects and repeat phrases. Cranial nerves: There is good facial symmetry. 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  Pupils are equal round and reactive to light bilaterally. Fundoscopic exam reveals clear margins bilaterally. Extraocular muscles are intact. There are no square wave jerks.  The visual fields are full to confrontational testing. The speech is fluent and clear.  He is hypophonic.  The patient is able to make the gutteral sounds without difficulty. Soft palate rises symmetrically and there is no tongue deviation. Hearing is intact to conversational tone. Sensation: Sensation is intact to light and pinprick throughout (facial,  trunk, extremities). Vibration is intact at the bilateral big toe. There is no extinction with double simultaneous stimulation. There is no sensory dermatomal level identified. Motor: Strength is 5/5 in the bilateral upper and lower extremities.   Shoulder shrug is equal and symmetric.  There is no pronator drift.   Movement examination: Tone: There is normal tone bilaterally, but there is a degree of gegenhalten/paratonia in the upper extremities bilaterally Abnormal movements: No tremor noted today even with distraction Coordination:  There is decremation with  RAM's, seen with hand opening and closing on the left, finger taps on the left, alternation of supination/pronation on the left, heel taps and toe taps on the left. Gait and Station: The patient has no difficulty arising out of a deep-seated chair without the use of the hands.   The patient's stride length is just slightly decreased with decreased arm swing on the left.    His pull test was negative today.  ASSESSMENT/PLAN:  1.  Parkinsonism  -While he certainly could have idiopathic PD, especially since he was only on the Ros[place] for 6-8 weeks and sx's preceded that, he was exposed to multiple other antipsychotics and I am unsure of the timeline of those exposures.  He has been off of [place] since early February, and his examination had improved when he got off of Latuda, even before starting on Requip.  He reports symptoms worsening (although his exam has not necessarily worsen) and has difficulty tolerating Requip increases.  I will try to switch him to Requip XL and increase to 6 mg at night (currently on a total of 4 mg daily).  -He asked me about DBS therapy.  While I reviewed it in detail with him, I also told him that I do not think he needs currently.  I also told him that even if he did need it currently, I do not necessarily think he would be a good candidate because of significant depression.    -He has a screening for physical therapy in October and suspect that he will need to be reenrolled.   -They asked me about Social Security disability.  He has about one more year left to work before retirement.  I told both of them that I would be happy to support him in anyway that I can, but I also cautioned them that I think he could potentially go downhill mentally (from depression) if he decides to leave work now.  He has worked all his life and finds some meaning in this.  2  Depression  -The patient reports that this has been stable.  He is not suicidal or homicidal.  He will remain on Celexa and  Wellbutrin and remain under the care of psychiatry. 3.  Mild cognitive impairment   -The patient had neuropsych testing done in June, 2016 and he does not have dementia.  I talked to him about mild cognitive impairment and also told him that the neuropsychologist as well as myself think that depression plays a significant role (pseudodementia).   4.  Follow-up with me will be in the next 3 months, sooner should new neurologic issues arise.  Much greater than 50% of this visit was spent in counseling with the patient and the family.  Total face to face time:  40 min

## 2015-05-27 ENCOUNTER — Telehealth: Payer: Self-pay | Admitting: Neurology

## 2015-05-27 NOTE — Telephone Encounter (Signed)
Spoke with patient's wife and she states they had to special order Requip 6 mg XL - they will remain on current dose until they get this dosage in.

## 2015-05-27 NOTE — Telephone Encounter (Signed)
Pt has a medication question/ can he continue taking "Ropinirole"? [phone]

## 2015-06-04 ENCOUNTER — Ambulatory Visit (INDEPENDENT_AMBULATORY_CARE_PROVIDER_SITE_OTHER): Payer: Federal, State, Local not specified - PPO | Admitting: Psychiatry

## 2015-06-04 ENCOUNTER — Encounter (HOSPITAL_COMMUNITY): Payer: Self-pay | Admitting: Psychiatry

## 2015-06-04 VITALS — BP 130/78 | HR 71 | Wt 264.0 lb

## 2015-06-04 DIAGNOSIS — F332 Major depressive disorder, recurrent severe without psychotic features: Secondary | ICD-10-CM

## 2015-06-04 MED ORDER — CITALOPRAM HYDROBROMIDE 40 MG PO TABS: 40.0000 mg | ORAL_TABLET | Freq: Every day | ORAL | Status: DC

## 2015-06-04 MED ORDER — BUPROPION HCL ER (XL) 300 MG PO TB24: 300.0000 mg | ORAL_TABLET | Freq: Every morning | ORAL | Status: DC

## 2015-06-04 NOTE — Progress Notes (Signed)
[facility] [phone] Progress Note  [name] [name] [id number] 64 y.o.  06/04/2015 1:48 PM  Chief Complaint:  I depression is much better.  I still have tremors and shakes.             History of Present Illness:  [name] [name] came for his follow-up appointment.  Usually he comes with his wife but today he told his wife was not feeling well and could not calm.  Overall he described his mood is much better.  He has more energy and he is less depressed and less anxious.  He recently seen his neurologist for the tremors and Parkinson.  He had tried MiraLAX but he felt his tremor got worse.  Now he is trying Requip but he does not see that he just improvement.  His appetite is okay.  He is working afternoon shift and he likes because he is able to sleep and the night and he still have some time during the day.  He is taking Celexa and Wellbutrin.  He likes his and died of present and does not feel that he needed to add more medication.  His affect is improved.  He is hoping that his tremors and Parkinson get under control with the Requip.  He is also concerned about his memory and recently he has psychological testing which shows mild cognitive impairment and depression.  Patient told that his neurologist did not give anything to help memory impairment.  He is thinking to get her tired next year however I explained that he should have some work either Psychologist, occupational a part-time to keep him busy.  Patient admitted that because he does not want to be too much time on his hand and does not want to get sad and depressed.  He denies any crying spells, feeling of hopelessness or worthlessness.  He has no agitation or anger.  He denies any crying spells.  He does not want to change his anti-depressed and.  In the past we have talked about [place] but patient does not want [place] because of financial strain.  Patient does not feel he need to add or try new medication for depression.  Patient denies drinking or using any  illegal substances.  His appetite is okay.  His vitals are stable.  He lives with his wife who is very supportive.  Suicidal Ideation: No Plan Formed: No Patient has means to carry out plan: No  Homicidal Ideation: No Plan Formed: No Patient has means to carry out plan: No  Review of Systems  Constitutional: Negative for weight loss.  HENT: Negative.   Neurological: Positive for tremors. Negative for dizziness.  Psychiatric/Behavioral: Negative for hallucinations.   Psychiatric: Agitation: No Hallucination: No Depressed Mood: No Insomnia: No Hypersomnia: Yes Altered Concentration: No Feels Worthless: No Grandiose Ideas: No Belief In Special Powers: No New/Increased Substance Abuse: No Compulsions: No  Neurologic: Headache: No Seizure: No Paresthesias: No  Past Medical History: Hypogonadism, Parkinson  Social history. Patient is employed and married.  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  Outpatient Encounter Prescriptions as of 06/04/2015  Medication Sig  . B Complex-C (B-COMPLEX WITH VITAMIN C) tablet Take 1 tablet by mouth daily.  Marland Kitchen buPROPion (WELLBUTRIN XL) 300 MG 24 hr tablet Take 1 tablet (300 mg total) by mouth every morning.  . citalopram (CELEXA) 40 MG tablet Take 1 tablet (40 mg total) by mouth daily.  . Omega 3-6-9 Fatty Acids (OMEGA 3-6-9 PO) Take by mouth.  Marland Kitchen  Ropinirole HCl (REQUIP XL) 6 MG TB24 Take 1 tablet (6 mg total) by mouth daily.  . TURMERIC CURCUMIN PO Take by mouth.  . [DISCONTINUED] buPROPion (WELLBUTRIN XL) 300 MG 24 hr tablet Take 1 tablet (300 mg total) by mouth every morning.  . [DISCONTINUED] citalopram (CELEXA) 40 MG tablet Take 1 tablet (40 mg total) by mouth daily.   No facility-administered encounter medications on file as of 06/04/2015.    Past Psychiatric History/Hospitalization(s): Patient denies any history of suicidal attempt or any inpatient psychiatric treatment.  He denies any history of mania, psychosis,  paranoia or any hallucination.  In the past he had tried Effexor, Abilify, Paxil, Cymbalta, Zoloft, amitriptyline, Pristiq, Britnellex, Celexa, Deplin and Vyvanse.  He is seen in this office since September 2009.  He has seen multiple psychiatrists in the past.  Anxiety: No Bipolar Disorder: No Depression: Yes Mania: No Psychosis: No Schizophrenia: No Personality Disorder: No Hospitalization for psychiatric illness: No History of Electroconvulsive Shock Therapy: No Prior Suicide Attempts: No  Physical Exam: Constitutional:  BP 130/78 mmHg  Pulse 71  Wt 264 lb (119.75 kg)  Recent Results (from the past 2160 hour(s))  Comp Met (CMET)     Status: None   Collection Time: 03/16/15  9:23 AM  Result Value Ref Range   Sodium 137 135 - 145 mEq/L   Potassium 4.3 3.5 - 5.1 mEq/L   Chloride 103 96 - 112 mEq/L   CO2 28 19 - 32 mEq/L   Glucose, Bld 94 70 - 99 mg/dL   BUN 14 6 - 23 mg/dL   Creatinine, Ser 1.12 0.40 - 1.50 mg/dL   Total Bilirubin 0.6 0.2 - 1.2 mg/dL   Alkaline Phosphatase 63 39 - 117 U/L   AST 15 0 - 37 U/L   ALT 13 0 - 53 U/L   Total Protein 7.3 6.0 - 8.3 g/dL   Albumin 4.2 3.5 - 5.2 g/dL   Calcium 9.4 8.4 - 10.5 mg/dL   GFR 70.69 >60.00 mL/min  HgB A1c     Status: None   Collection Time: 03/16/15  9:23 AM  Result Value Ref Range   Hgb A1c MFr Bld 5.8 4.6 - 6.5 %    Comment: Glycemic Control Guidelines for People with Diabetes:Non Diabetic:  <6%Goal of Therapy: <7%Additional Action Suggested:  >8%   Lipid panel     Status: Abnormal   Collection Time: 03/16/15  9:23 AM  Result Value Ref Range   Cholesterol 180 0 - 200 mg/dL    Comment: ATP III Classification       Desirable:  < 200 mg/dL               Borderline High:  200 - 239 mg/dL          High:  > = 240 mg/dL   Triglycerides 89.0 0.0 - 149.0 mg/dL    Comment: Normal:  <150 mg/dLBorderline High:  150 - 199 mg/dL   HDL 56.80 >39.00 mg/dL   VLDL 17.8 0.0 - 40.0 mg/dL   LDL Cholesterol 105 (H) 0 - 99 mg/dL    Total CHOL/HDL Ratio 3     Comment:                Men          Women1/2 Average Risk     3.4          3.3Average Risk          5.0  4.42X Average Risk          9.6          7.13X Average Risk          15.0          11.0                       NonHDL 123.20     Comment: NOTE:  Non-HDL goal should be 30 mg/dL higher than patient's LDL goal (i.e. LDL goal of < 70 mg/dL, would have non-HDL goal of < 100 mg/dL)    General Appearance: alert, oriented, no acute distress  Musculoskeletal: Strength & Muscle Tone: decreased Gait & Station: normal Patient leans: N/A   Mental Status Examination: Patient is casually dressed and fairly groomed.  He maintained fair eye contact.  His speech is slow with decreased volume and tone.  His response to the question is slow.  His thought process is slow but coherent.  His affect is flat.  He denies any auditory or visual hallucination.  He denies any active or passive suicidal thoughts and homicidal thoughts.  There were no delusions or any paranoia.  His attention and concentration is fair. He described his mood euthymic and his affect is flat.  He has some difficulty remembering things but he is alert and oriented 3.  His psychomotor activity is decreased.  His fund of knowledge is average.  His insight judgment and impulse control is okay.  Established Problem, Stable/Improving (1), Review or order clinical lab tests (1), Decision to obtain old records (1), Review and summation of old records (2), Review of Last Therapy Session (1) and Review of Medication Regimen & Side Effects (2)  Assessment: Axis I: Maj. depressive disorder, recurrent, severe.  Mild cognitive impairment.   Axis II: Deferred  Axis III: Hypogonadism, Parkinson   Plan:  I reviewed records from his neurologist blood work results , current medication and psychosocial stressors.  I also reviewed psychological testing which was done on June 14 by [facility] .  Results  reviewed and discussed with the patient that he has mild cognitive impairment and depression .  It was recommended to try [facility] however patient opt out due to financial reasons.  Patient is more focused and interested to get treatment for his Parkinson.  He is very hopeful that Lillia Dallas will help his tremors.  At this time he does not want to change the Wellbutrin and Celexa.  He is thinking about retirement next year but I suggested that he should keep himself busy by doing either part-time or volunteer work.  Patient agreed with the plan.  I also offered counseling but patient declined.  Continue Wellbutrin XL 300 mg daily and Celexa 40 mg daily.  Discussed medication side effects and benefits.  Recommended to call us back if he has any question or any concern.  Time spent 25 minutes.  More than 50% of the time is spent in psychoeducation, counseling and coordination of care.  Patient was given enough time to ask question about his medication, diagnoses her long-term prognosis.   Krysten Veronica T., MD 06/04/2015

## 2015-06-11 ENCOUNTER — Telehealth: Payer: Self-pay | Admitting: Neurology

## 2015-06-11 DIAGNOSIS — G20 Parkinson's disease: Secondary | ICD-10-CM

## 2015-06-11 NOTE — Telephone Encounter (Signed)
-----   Message from Simms, DO sent at 06/11/2015 12:00 PM EDT ----- Regarding: FW: referral to PT/OT/ST for PD   ----- Message -----    From: Hulda Marin, OT    Sent: 06/11/2015  11:21 AM      To: Hulda Marin, OT, Kaylyn Lim, # Subject: referral to PT/OT/ST for PD                    Dr. Carles Collet, We have Mr. [name] [name]  scheduled  for PT/OT/ST evals on October 4th as we recommended at his discharge. (He was in agreement with this plan). Please send a new prescription for PT/OT/ST if you are in agreement. Thanks, [employer], OTR/L

## 2015-06-11 NOTE — Telephone Encounter (Signed)
Order entered

## 2015-06-15 ENCOUNTER — Ambulatory Visit (HOSPITAL_COMMUNITY): Payer: Self-pay | Admitting: Psychiatry

## 2015-06-22 ENCOUNTER — Ambulatory Visit (HOSPITAL_COMMUNITY): Payer: Self-pay | Admitting: Psychiatry

## 2015-06-25 ENCOUNTER — Ambulatory Visit: Payer: Federal, State, Local not specified - PPO | Admitting: Occupational Therapy

## 2015-06-25 ENCOUNTER — Ambulatory Visit: Payer: Federal, State, Local not specified - PPO

## 2015-06-25 ENCOUNTER — Ambulatory Visit: Payer: Federal, State, Local not specified - PPO | Attending: Neurology | Admitting: Physical Therapy

## 2015-06-25 DIAGNOSIS — R258 Other abnormal involuntary movements: Secondary | ICD-10-CM

## 2015-06-25 DIAGNOSIS — G20 Parkinson's disease: Secondary | ICD-10-CM

## 2015-06-25 DIAGNOSIS — R2681 Unsteadiness on feet: Secondary | ICD-10-CM | POA: Insufficient documentation

## 2015-06-25 DIAGNOSIS — R279 Unspecified lack of coordination: Secondary | ICD-10-CM

## 2015-06-25 DIAGNOSIS — R4189 Other symptoms and signs involving cognitive functions and awareness: Secondary | ICD-10-CM | POA: Insufficient documentation

## 2015-06-25 DIAGNOSIS — R490 Dysphonia: Secondary | ICD-10-CM | POA: Insufficient documentation

## 2015-06-25 DIAGNOSIS — R278 Other lack of coordination: Secondary | ICD-10-CM

## 2015-06-25 DIAGNOSIS — Z7409 Other reduced mobility: Secondary | ICD-10-CM | POA: Diagnosis present

## 2015-06-25 DIAGNOSIS — R29898 Other symptoms and signs involving the musculoskeletal system: Secondary | ICD-10-CM

## 2015-06-25 DIAGNOSIS — R293 Abnormal posture: Secondary | ICD-10-CM | POA: Insufficient documentation

## 2015-06-25 DIAGNOSIS — R269 Unspecified abnormalities of gait and mobility: Secondary | ICD-10-CM | POA: Insufficient documentation

## 2015-06-25 NOTE — Therapy (Signed)
[facility] [address] [place], [place], [address] Phone: [phone]   Fax:  [phone]  Occupational Therapy Evaluation  Patient Details  Name: [name] [name] MRN: [id number] Date of Birth: 12/27/1950 Referring Provider:  Ludwig Clarks, DO  Encounter Date: 06/25/2015      OT End of Session - 06/25/15 2130    Visit Number 1   Number of Visits 17   Date for OT Re-Evaluation 08/25/15   Authorization Type [employer] 75 visit limit combined, no auth, **56 visits remaining combined   OT Start Time 1020   OT Stop Time 1105   OT Time Calculation (min) 45 min   Activity Tolerance Patient tolerated treatment well   Behavior During Therapy [facility] for tasks assessed/performed      Past Medical History  Diagnosis Date  . Headache(784.0)   . Cardiac murmur     as a child  . Streptococcal meningitis     as an infant  . Depression   .  OSA (obstructive sleep apnea) 01/17/2011    npsg 2012:  AHI 67/hr. Auto titration 2012:  Optimal pressure 12cm.   Marland Kitchen HEADACHES, HX OF 02/18/2008    Qualifier: Diagnosis of  By: Danny Lawless CMA, [place]    . APPENDECTOMY, HX OF 02/18/2008    Qualifier: Diagnosis of  By: Danny Lawless CMA, [place]    . Parkinson disease 11/2014    Past Surgical History  Procedure Laterality Date  . Nasal sinus surgery      x 4 as a child  . Vasectomy    . Appendectomy  1967    There were no vitals filed for this visit.  Visit Diagnosis:  Bradykinesia - Plan: Ot plan of care cert/re-cert  Rigidity - Plan: Ot plan of care cert/re-cert  Decreased coordination - Plan: Ot plan of care cert/re-cert  Unsteadiness - Plan: Ot plan of care cert/re-cert  Decreased functional mobility and endurance - Plan: Ot plan of care cert/re-cert  Posture abnormality - Plan: Ot plan of care cert/re-cert  Cognitive deficits - Plan: Ot plan of care cert/re-cert      Subjective Assessment - 06/25/15 1023    Subjective  "I have  more 'statue effect'"   Pertinent History dx PD 11/2014, hx of depression, mild cognitive impairment per recent neuropsych cognitive testing   Patient Stated Goals pt reports incr tremors, slower walking, more freezing   Currently in Pain? No/denies           [facility] OT Assessment - 06/25/15 1026    Assessment   Diagnosis Parkinsonism   Onset Date 11/20/14   Prior Therapy d/c OT, PT, ST 01/2015   Precautions   Precautions Fall   Precaution Comments Pt describes freezing episodes, more often than before; occurs when standing/body stops with thinking and cognitive tasks   Balance Screen   Has the patient fallen in the past 6 months No   Home  Environment   Family/patient expects to be discharged to: Private residence   Lives With Daughter;Spouse   Prior Function   Level of Independence Independent with basic ADLs;Independent with household mobility with device;Independent with community mobility without device;Independent with gait;Independent with transfers   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), placing signs on equipment   Vocation Requirements lifting up to 70lbs, pushing (having difficulty pushing carts, remembering task he is trying to complete)   Leisure enjoys listening to music/watching tv   ADL   Eating/Feeding Modified independent  min  difficulty opening containers/packages   Grooming --  incr time   Upper Body Bathing Modified independent   Lower Body Bathing Modified independent   Upper Body Dressing Increased time  occasional min difficulty with buttons   Lower Body Dressing --  difficulty tying shoes, has shoe horn   Toilet Tranfer Modified independent   Toileting - Clothing Manipulation Modified independent   Toileting -  Hygiene Modified Independent   Tub/Shower Transfer Modified independent   Transfers/Ambulation Related to ADL's difficulty with bed mobility, incr lower back pain in am, difficulty with car transfer, freezing episodes with  cognitive demands, has to use hands for transfers   ADL comments increased time for all tasks, forward head/lean, rounded shoulders and narrow stance noted   IADL   Shopping --  goes with wife   Prior Level of Function Light Housekeeping wife performs   Prior Level of Function Meal Prep wife performs   Prior Level of Market researcher --  drives on vehicle, difficulty staying in lane   Medication Management Is responsible for taking medication in correct dosages at correct time  forgets 1-2x/ month   Prior Level of Function Financial Management Pt reports that he/wife do together, but pt used to write checks and doesn't write them any longer   Mobility   Mobility Status Independent;Freezing   Mobility Status Comments Pt reports difficulty with freezing with divided attention/cognitive tasks   Written Expression   Dominant Hand Right   Handwriting 100% legible;Mild micrographia  today; however pt reports typically smaller   Vision - History   Baseline Vision Wears glasses all the time   Vision Assessment   Vision Assessment Vision not tested   Activity Tolerance   Activity Tolerance Comments Pt reports significant fatigue especially at mid day.   Cognition   Overall Cognitive Status Impaired/Different from baseline  neuropsych cognitive testing-mild cognitive impairment   Area of Impairment Memory;Attention   Current Attention Level Divided  pt reports difficulty, freezing, affecting driving   Attention Comments Pt reports difficulty staying in lane with visual distraction, and incr freezing with cognitive demands   Memory Decreased short-term memory   Memory Comments difficulty within work tasks   Bradyphrenia Yes  per records, depression may be contributing, no dementia   Observation/Other Assessments   Standing Functional Reach Test R-9", L-9"   Simulated Eating Time (seconds) 12.13   Donning Doffing Jacket Time (seconds) 21.53    Coordination   Right 9 Hole Peg Test 30.54   Left 9 Hole Peg Test 51.34   Box and Blocks R- 49blocks, L-48 blocks (with 2-3 blocks that did not count due to bradykinesia with each UE)   Tremors pt reports intermittent full body tremors or tremors in both hands   Coordination decr speed/rhythm with sequential opposition with BUEs   AROM   Overall AROM  Within functional limits for tasks performed   RUE Tone   RUE Tone Mild   LUE Tone   LUE Tone Mild                         OT Education - 06/25/15 2053    Education Details Discussed safety concerns with driving and cautioned pt re: driving if meds aren't working well, discussed possible driving eval and having family give feedback regarding driving.   Began discussing Voc Rehab as Heritage manager) Educated Patient   Methods Explanation   Comprehension Verbalized understanding  OT Short Term Goals - 06/25/15 2147    OT SHORT TERM GOAL #1   Title Pt will be independent with updated HEP and appropriate community resources prn--STGs due 07/25/15   Time 4   Period Weeks   Status New   OT SHORT TERM GOAL #2   Title Pt will improve coordination for ADLs as shown by improving time on 9-hole peg test by at least 8sec with LUE.   Baseline L-51.34sec   Time 4   Period Weeks   Status New   OT SHORT TERM GOAL #3   Title Pt will report incr ease with car transfers and bed mobility using stategies.   Time 4   Period Weeks   Status New   OT SHORT TERM GOAL #4   Title Pt will complete dressing fasteners mod I using strategies/AE prn.--due 12/30/14   Baseline pt reports difficulty tying shoes and sometimes buttons   Time 4   Period Weeks   Status New   OT SHORT TERM GOAL #5   Title Pt will verbalize understanding of cognitive compensation strategies/ways to promote cognitive skills prn.   Baseline pt reports incr difficulty with divided attention and memory   Time 4   Period Weeks   Status New            OT Long Term Goals - 06/25/15 2151    OT LONG TERM GOAL #1   Title Pt will verbalize understanding of updated AE/strategies for ADLs/IADLs prn.--check LTGs 08/24/15   Time 8   Period Weeks   Status New   OT LONG TERM GOAL #2   Title Pt will improve coordination for ADLs as shown by improving time on 9-hole peg test by at least 15sec with LUE.   Baseline R-30.54sec, L-51.34sec   Time 8   Status New   OT LONG TERM GOAL #3   Title Pt will improve coordination/functional reaching as shown by improving score on box and blocks test by at least 5 with BUE   Baseline R-49 blocks, L-48 blocks   Time 8   Period Weeks   Status New   OT LONG TERM GOAL #4   Title Pt will improve balance/functional reaching for ADLs as shown by improving standing functional reach test to at least 11inches bilaterally.   Baseline 9" bilaterally   Time 8   Period Weeks   Status New   OT LONG TERM GOAL #5   Title Pt will improve dressing ability as shown by improving time on PPT#4 by at least 5sec.--due 01/29/15   Baseline 21.53sec   Time 8   Period Weeks   Status New   OT LONG TERM GOAL #6   Title -------------------------               Plan - 06/25/15 2132    Clinical Impression Statement Pt is a 64 y.o. male who was diagnosed with Parkinsonism >1 year ago.  Pt has hx of depression.  Pt, who is known to this therapist, reports of increased freezing episodes, increased difficulty with divided attention/memory/cognition, increased difficulty with performing work tasks.  Pt presents with the following deficits that impact ADL/IADL performance:  bradykinesia, rigidity, decreased coordination, decreased functional mobility, posture abnormality, decreased balance for ADLs, freezing, decreased activity tolerance.  Pt would benefit from occupational therapy to address these deficits in order to prevent future complications, improve ADL performance, update/re-establish PD-specific HEP, and improve quality of  life.   Pt will benefit from skilled therapeutic intervention  in order to improve on the following deficits (Retired) Decreased coordination;Decreased endurance;Decreased activity tolerance;Impaired tone;Impaired UE functional use;Decreased knowledge of use of DME;Decreased balance;Decreased cognition;Decreased mobility  bradykinesia, abnormal posture   Rehab Potential Good   OT Frequency 2x / week   OT Duration 8 weeks  +eval   OT Treatment/Interventions Self-care/ADL training;Moist Heat;DME and/or AE instruction;Splinting;Patient/family education;Balance training;Therapeutic exercises;Therapeutic exercise;Therapeutic activities;Cognitive remediation/compensation;Passive range of motion;Functional Mobility Training;Neuromuscular education;Cryotherapy;Electrical Stimulation;Energy conservation;Manual Therapy   Plan begin update/review of previous HEP   Consulted and Agree with Plan of Care Patient        Problem List Patient Active Problem List   Diagnosis Date Noted  . MDD (major depressive disorder), recurrent episode, severe 02/10/2014  . Nonspecific abnormal electrocardiogram (ECG) (EKG) 02/07/2014  . Non-compliant behavior 02/03/2014  . Morton's neuroma of left foot 08/07/2013  . 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 Major depressive disorder, recurrent episode, severe 03/13/2012  . Hyperglycemia 01/26/2012  . Hypogonadism male 01/24/2012  . Syncope and collapse 01/20/2012  .  OSA (obstructive sleep apnea) 01/17/2011  . CHEST TIGHTNESS 02/18/2008    [place] 06/25/2015, 10:06 PM  [facility] [address] [place], [place], [address] Phone: [phone]   Fax:  [place], OTR/L 06/25/2015 10:06 PM

## 2015-06-25 NOTE — Therapy (Addendum)
[facility] [address] [place], [place], [address] Phone: [phone]   Fax:  [phone]  Speech Language Pathology Evaluation  Patient Details  Name: [name] [name] MRN: [id number] Date of Birth: [birth year] Referring Provider:  Olga Millers, MD  Encounter Date: 06/25/2015      End of Session - 06/25/15 1246    Visit Number 1   Number of Visits 16   Date for SLP Re-Evaluation 08/25/15   Authorization Type 56 visits remain as of 06-25-15   Authorization - Number of Visits 18   SLP Start Time [phone]   SLP Stop Time  1017   SLP Time Calculation (min) 43 min   Activity Tolerance Patient tolerated treatment well      Past Medical History  Diagnosis Date  . Headache(784.0)   . Cardiac murmur     as a child  . Streptococcal meningitis     as an infant  . Depression   .  OSA (obstructive sleep apnea) 01/17/2011    npsg 2012:  AHI 67/hr. Auto titration 2012:  Optimal pressure 12cm.   Marland Kitchen HEADACHES, HX OF 02/18/2008    Qualifier: Diagnosis of  By: Danny Lawless CMA, [place]    . APPENDECTOMY, HX OF 02/18/2008    Qualifier: Diagnosis of  By: Danny Lawless CMA, [place]    . Parkinson disease 11/2014    Past Surgical History  Procedure Laterality Date  . Nasal sinus surgery      x 4 as a child  . Vasectomy    . Appendectomy  1967    There were no vitals filed for this visit.  Visit Diagnosis: Hypokinetic Parkinsonian dysphonia      Subjective Assessment - 06/25/15 1003    Subjective Pt has not performed loud /a/ "in a long time".            SLP Evaluation [facility] - 06/25/15 1004    SLP Visit Information   SLP Received On 06/25/15   Onset Date approx 12 months   Medical Diagnosis Parkinsonism   Pain Assessment   Currently in Pain? No/denies   Prior Functional Status   Cognitive/Linguistic Baseline Baseline deficits   Baseline deficit details memory loss    Lives With Spouse;Family   Vocation Full time  employment   Cognition   Overall Cognitive Status Impaired/Different from baseline   Area of Impairment Memory   Memory Decreased short-term memory   Auditory Comprehension   Overall Auditory Comprehension Appears within functional limits for tasks assessed   Verbal Expression   Overall Verbal Expression Appears within functional limits for tasks assessed   Oral Motor/Sensory Function   Labial ROM Within Functional Limits   Labial Coordination Reduced   Lingual ROM Reduced right;Reduced left   Lingual Symmetry Abnormal symmetry left   Lingual Strength Reduced Left  slight   Lingual Coordination Reduced   Overall Oral Motor/Sensory Function Pt denies overt s/s aspiration during meals, however, reports "liquid building up" at bedtime   Motor Speech   Phonation Low vocal intensity;Hoarse   Effective Techniques Increased vocal intensity       15 minutes of simple conversational speech was at average 69dB (WNL= average 70-72dB) with range of 65-72dB, when a sound level meter was placed 30 cm away from pt's mouth. He remained at or above 70dB approx 75% of the time. Overall intelligibility for this listener in a quiet environment was approx 100%. Production of loud /a/ averaged 89dB (range of 83 to 95)  and occasional mod cues needed for loudness.    In simple conversatino after loud /a/, pt was asked to use the same amount of effort as with loud /a/. Loudness average with this increased effort was 71dB (range of 68 to 74) with rare min A for loudness. Pt would benefit from skilled ST in order to improve speech intelligibility and pt's QOL.                      SLP Short Term Goals - 06/25/15 1251    SLP SHORT TERM GOAL #1   Title Pt will produce loud /a/ average 88dB over 4 sessions   Time 4   Period Weeks   Status New   SLP SHORT TERM GOAL #2   Title Pt will demonstrate average of 70dB for in 10 minutes of mod complex conversaiton outside therapy room over two sessions    Time 4   Period Weeks   Status New   SLP SHORT TERM GOAL #3   Title pt will demo knowledge of s/s aspiration with modified independence   Time 4   Period Weeks   Status New          SLP Long Term Goals - 06/25/15 1253    SLP LONG TERM GOAL #1   Title pt to maintain average 70dB in 15 minutes mod complex conversation outside [place] room over three sessions   Time 8   Period Weeks  (or 16 visits)   Status New          Plan - 06/25/15 1248    Clinical Impression Statement Pt reports wife frequently tells him to incr his volume - "Stop mumbling." Pt presents today with loudness in ST room >/= 70dB 80% of the time. Pt admits he is striving to incr loudness when with SLP but does not do so at home. Skilled ST needed to assist pt in carryover of more intelligible speech to home/community.   Speech Therapy Frequency 2x / week   Duration --  8 weeks (4-6 possible), or 16 visits   Treatment/Interventions SLP instruction and feedback;Internal/external aids;Patient/family education;Functional tasks;Compensatory strategies   Potential to Achieve Goals Good        Problem List Patient Active Problem List   Diagnosis Date Noted  . MDD (major depressive disorder), recurrent episode, severe 02/10/2014  . Nonspecific abnormal electrocardiogram (ECG) (EKG) 02/07/2014  . Non-compliant behavior 02/03/2014  . Morton's neuroma of left foot 08/07/2013  . 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 Major depressive disorder, recurrent episode, severe 03/13/2012  . Hyperglycemia 01/26/2012  . Hypogonadism male 01/24/2012  . Syncope and collapse 01/20/2012  .  OSA (obstructive sleep apnea) 01/17/2011  . CHEST TIGHTNESS 02/18/2008    SCHINKE,CARL , MS, CCC-SLP  06/25/2015, 12:56 PM  [facility] [address] [place], [place], [address] Phone: [phone]   Fax:  [phone]

## 2015-06-25 NOTE — Patient Instructions (Signed)
Complete 5 loud "ah" twice a day

## 2015-06-26 NOTE — Therapy (Signed)
[facility] [address] [place], [place], [address] Phone: [phone]   Fax:  [phone]  Physical Therapy Evaluation  Patient Details  Name: [name] [name] MRN: [id number] Date of Birth: 1950/12/18 Referring Khiem Gargis:  Olga Millers, MD  Encounter Date: 06/25/2015      PT End of Session - 06/26/15 0752    Visit Number 1  New episode of care 06/25/15   Number of Visits 9   Date for PT Re-Evaluation 08/24/15   Authorization Type [employer] 75 visit limit combined (56 remain per eval note-PT to check with Scientist, clinical (histocompatibility and immunogenetics))   PT Start Time [phone]   PT Stop Time 0932   PT Time Calculation (min) 42 min   Activity Tolerance Patient tolerated treatment well   Behavior During Therapy [facility] for tasks assessed/performed      Past Medical History  Diagnosis Date  . Headache(784.0)   . Cardiac murmur     as a child  . Streptococcal meningitis     as an infant  . Depression   .  OSA (obstructive sleep apnea) 01/17/2011    npsg 2012:  AHI 67/hr. Auto titration 2012:  Optimal pressure 12cm.   Marland Kitchen HEADACHES, HX OF 02/18/2008    Qualifier: Diagnosis of  By: Danny Lawless CMA, [place]    . APPENDECTOMY, HX OF 02/18/2008    Qualifier: Diagnosis of  By: Danny Lawless CMA, [place]    . Parkinson disease 11/2014    Past Surgical History  Procedure Laterality Date  . Nasal sinus surgery      x 4 as a child  . Vasectomy    . Appendectomy  1967    There were no vitals filed for this visit.  Visit Diagnosis:  Bradykinesia  Abnormality of gait  Abnormal posture      Subjective Assessment - 06/25/15 0856    Subjective Pt is a 64 year old male with Parkinson's disease, who saw Dr. Carles Collet 05/25/15.  She has recommended return to therapy.  Patient has not had any falls in the past 6 months, but he does report feeling weaker than he was.  Pt does not consistently do HEP from previous bout of therapy; he uses recumbent bike at home  everyday.     Patient Stated Goals Pt's goal for therapy is to continue to get stronger and move better.   Currently in Pain? No/denies            [facility] PT Assessment - 06/25/15 7035    Assessment   Medical Diagnosis Parkinson's disease  noticing increased tremor; L side more involved   Onset Date/Surgical Date 05/25/15  MD visit   Precautions   Precautions Fall   Precaution Comments Pt describes freezing episodes, more often than before; occurs when standing/body stops with thinking and cognitive tasks   Balance Screen   Has the patient fallen in the past 6 months No   Has the patient had a decrease in activity level because of a fear of falling?  Yes   Is the patient reluctant to leave their home because of a fear of falling?  No   Home Social worker Private residence   Living Arrangements Alone;Spouse/significant other   Available Help at Discharge Family   Type of [place] to enter   Entrance Stairs-Number of Steps 5   Entrance Stairs-Rails Right   Home Layout Two level;Bed/bath upstairs   Prior Function   Level of  Independence Independent with basic ADLs;Independent with household mobility with device;Independent with community mobility without device;Independent with gait;Independent with transfers   Vocation Full time employment   Vocation Requirements lifting up to 70lbs, pushing   Observation/Other Assessments   Focus on Therapeutic Outcomes (FOTO)  Functional Status Intake score 45; Neuro QOL lower extremity:  43.1   Posture/Postural Control   Posture/Postural Control Postural limitations   Postural Limitations Forward head;Rounded Shoulders   ROM / Strength   AROM / PROM / Strength Strength   Strength   Overall Strength Comments grossly tested at least 4/5 to 5/5 throughout lower extremities  Pt feels he is weaker with work and functional activities   Transfers   Transfers Sit to Stand;Stand to [employer] to Stand 6:  Modified independent (Device/Increase time);Without upper extremity assist;From chair/3-in-1  prefers use of UE support on regular basis   Five time sit to stand comments  18.42   Stand to Sit 6: Modified independent (Device/Increase time);Without upper extremity assist;To chair/3-in-1   Ambulation/Gait   Ambulation/Gait Yes   Ambulation/Gait Assistance 7: Independent   Ambulation Distance (Feet) 200 Feet   Assistive device None   Gait Pattern Decreased arm swing - right;Decreased arm swing - left;Step-through pattern;Decreased step length - right;Decreased step length - left;Narrow base of support;Trunk flexed;Decreased trunk rotation   Ambulation Surface Level;Indoor   Gait velocity 11.26 sec = 2.91 ft/sec   Timed Up and Go Test   Normal TUG (seconds) 16.58   Manual TUG (seconds) 18.67   Cognitive TUG (seconds) 15.75   Functional Gait  Assessment   Gait assessed  Yes   Gait Level Surface Walks 20 ft, slow speed, abnormal gait pattern, evidence for imbalance or deviates 10-15 in outside of the 12 in walkway width. Requires more than 7 sec to ambulate 20 ft.  8.38 seconds   Change in Gait Speed Makes only minor adjustments to walking speed, or accomplishes a change in speed with significant gait deviations, deviates 10-15 in outside the 12 in walkway width, or changes speed but loses balance but is able to recover and continue walking.   Gait with Horizontal Head Turns Performs head turns smoothly with slight change in gait velocity (eg, minor disruption to smooth gait path), deviates 6-10 in outside 12 in walkway width, or uses an assistive device.   Gait with Vertical Head Turns Performs task with slight change in gait velocity (eg, minor disruption to smooth gait path), deviates 6 - 10 in outside 12 in walkway width or uses assistive device   Gait and Pivot Turn Pivot turns safely within 3 sec and stops quickly with no loss of balance.   Step Over Obstacle Is able to step over one shoe box  (4.5 in total height) but must slow down and adjust steps to clear box safely. May require verbal cueing.   Gait with Narrow Base of Support Ambulates less than 4 steps heel to toe or cannot perform without assistance.   Gait with Eyes Closed Walks 20 ft, slow speed, abnormal gait pattern, evidence for imbalance, deviates 10-15 in outside 12 in walkway width. Requires more than 9 sec to ambulate 20 ft.  11.74 sec   Ambulating Backwards Walks 20 ft, uses assistive device, slower speed, mild gait deviations, deviates 6-10 in outside 12 in walkway width.  20.28 sec in 20 ft = 0.98 ft/sec   Steps Alternating feet, must use rail.   Total Score 15   FGA comment: Scores < 22/30  indicates increased fall risk                             PT Short Term Goals - 12/31/14 1025    PT SHORT TERM GOAL #1   Title Pt will be independent with HEP for improved transfers, balance, and gait. (Target 12/30/14)   Status Partially Met   PT SHORT TERM GOAL #2   Title Pt will improve 5x sit<>stand to less than or equal to 11.5 seconds for improved transfer efficiency and safety.   Status Achieved   PT SHORT TERM GOAL #3   Title Pt will improve Functional Gait Assessment score to at least 18/30 for decreased fall risk.   Status Achieved   PT SHORT TERM GOAL #4   Title Pt will improve TUG score to less than or equal to 13.5 seconds for decreased fall risk.   Status Achieved   PT SHORT TERM GOAL #5   Title Pt will verbalize understanding of local Parkinson's-disease related resources.   Status Achieved           PT Long Term Goals - 06/26/15 0910    PT LONG TERM GOAL #1   Title Pt will be independent with progressive HEP for improved functional mobility and gait.  TARGET 07/25/15   Time 4   Period Weeks   Status New   PT LONG TERM GOAL #2   Title Pt will improve TUG score to less than or equal to 13.5 seconds for decreased fall risk.   Time 4   Period Weeks   Status New   PT LONG  TERM GOAL #3   Title Pt will improve TUG cognitive score to less than or equal to 15 seconds for decreased fall risk and improved ability to multi-task.   Time 4   Period Weeks   Status New   PT LONG TERM GOAL #4   Title Pt will improve Functional Gait Assessment to at least 20/30 for decreased fall risk.   Time 4   Period Weeks   Status New   PT LONG TERM GOAL #5   Title Pt will improve 5x sit<>stand to less than or equal to 15 seconds for improved efficiency and safety with transfers.   Time 4   Period Weeks   Status New   Additional Long Term Goals   Additional Long Term Goals Yes   PT LONG TERM GOAL #6   Title Pt will verbalize plans for continued community fitness upon D/C from PT.   Time 4   Period Weeks   Status New               Plan - 06/26/15 0754    Clinical Impression Statement Pt is a 64 year old male who presents to OP PT with Parkinson's disease, known to this patient from previous bout of therapy approximately 6 months ago.  Pt returns for PT eval, with noted slowing of TUG scores, transfers, and lowered Functional Gait assessment scores.  Pt is at fall risk per TUG and FGA scores.  Pt presents with bradykinesia, decreased timing and coordination of gait, postural changes, decreased functional strength with transfers, and difficulty with dual tasking activities.  Pt would benefit from skilled PT to focus on the above stated deficits for improved functional mobility and decreased fall risk.   Pt will benefit from skilled therapeutic intervention in order to improve on the following deficits Abnormal gait;Decreased balance;Decreased mobility;Decreased  coordination;Decreased strength;Difficulty walking;Postural dysfunction   Rehab Potential Good   PT Frequency 2x / week   PT Duration 4 weeks  plus eval   PT Treatment/Interventions ADLs/Self Care Home Management;Therapeutic exercise;Therapeutic activities;Functional mobility training;Gait training;Balance  training;Neuromuscular re-education;Patient/family education   PT Next Visit Plan Review HEP, review walking program, focus on intensity/large amplitude movement patterns   Consulted and Agree with Plan of Care Patient         Problem List Patient Active Problem List   Diagnosis Date Noted  . MDD (major depressive disorder), recurrent episode, severe 02/10/2014  . Nonspecific abnormal electrocardiogram (ECG) (EKG) 02/07/2014  . Non-compliant behavior 02/03/2014  . Morton's neuroma of left foot 08/07/2013  . 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 Major depressive disorder, recurrent episode, severe 03/13/2012  . Hyperglycemia 01/26/2012  . Hypogonadism male 01/24/2012  . Syncope and collapse 01/20/2012  .  OSA (obstructive sleep apnea) 01/17/2011  . CHEST TIGHTNESS 02/18/2008    MARRIOTT,AMY W. 06/26/2015, 9:15 AM Frazier Butt., PT [facility] [address] [place], [place], [address] Phone: [phone]   Fax:  [phone]

## 2015-07-01 ENCOUNTER — Ambulatory Visit: Payer: Federal, State, Local not specified - PPO | Admitting: Occupational Therapy

## 2015-07-01 ENCOUNTER — Ambulatory Visit: Payer: Federal, State, Local not specified - PPO | Admitting: Physical Therapy

## 2015-07-01 DIAGNOSIS — R269 Unspecified abnormalities of gait and mobility: Secondary | ICD-10-CM

## 2015-07-01 DIAGNOSIS — R258 Other abnormal involuntary movements: Secondary | ICD-10-CM

## 2015-07-01 DIAGNOSIS — R279 Unspecified lack of coordination: Secondary | ICD-10-CM

## 2015-07-01 DIAGNOSIS — R4189 Other symptoms and signs involving cognitive functions and awareness: Secondary | ICD-10-CM

## 2015-07-01 DIAGNOSIS — R29898 Other symptoms and signs involving the musculoskeletal system: Secondary | ICD-10-CM

## 2015-07-01 DIAGNOSIS — R278 Other lack of coordination: Secondary | ICD-10-CM

## 2015-07-01 NOTE — Patient Instructions (Signed)
Pelvic Tilt   Flatten back by tightening stomach muscles and buttocks.  Then gently arch low back.   Repeat __10__ times per set. Do ___2_ sets per session.   http://orth.exer.us/134   Copyright  VHI. All rights reserved.  Lower Trunk Rotation Stretch   Keeping back flat and feet together, rotate knees to left side. Hold __30__ seconds.  Then repeat to other side. Repeat __3__ times per set.   http://orth.exer.us/122   Copyright  VHI. All rights reserved.  Knee to Chest   Pull one knee to chest until a stretch is felt in the lower back and buttock. Repeat on other side.  You can have your other leg straight or bent, whichever is more comfortable on your back. Hold __15-30__ seconds each side. Repeat _3___ times.  Copyright  VHI. All rights reserved.   These would be good exercises to do first thing in the morning before getting up out of bed.  --TRY PLACING A PILLOW BETWEEN YOUR KNEES WHEN YOU ARE LYING ON YOUR SIDE

## 2015-07-01 NOTE — Therapy (Signed)
[facility] [address] [place], [place], [address] Phone: [phone]   Fax:  [phone]  Physical Therapy Treatment  Patient Details  Name: [name] [name] MRN: [id number] Date of Birth: [birth year] Referring Provider:  [name], *  Encounter Date: 07/01/2015      PT End of Session - 07/01/15 1222    Visit Number 2   Number of Visits 9   Date for PT Re-Evaluation 08/24/15   Authorization Type BCBS 75 visit limit combined (56 remain per eval note-PT to check with Scientist, clinical (histocompatibility and immunogenetics))   PT Start Time [phone]   PT Stop Time 0931   PT Time Calculation (min) 42 min   Activity Tolerance Patient tolerated treatment well   Behavior During Therapy [facility] for tasks assessed/performed      Past Medical History  Diagnosis Date  . Headache(784.0)   . Cardiac murmur     as a child  . Streptococcal meningitis     as an infant  . Depression   .  OSA (obstructive sleep apnea) 01/17/2011    npsg 2012:  AHI 67/hr. Auto titration 2012:  Optimal pressure 12cm.   Marland Kitchen HEADACHES, HX OF 02/18/2008    Qualifier: Diagnosis of  By: Danny Lawless CMA, [place]    . APPENDECTOMY, HX OF 02/18/2008    Qualifier: Diagnosis of  By: Danny Lawless CMA, [place]    . Parkinson disease 11/2014    Past Surgical History  Procedure Laterality Date  . Nasal sinus surgery      x 4 as a child  . Vasectomy    . Appendectomy  1967    There were no vitals filed for this visit.  Visit Diagnosis:  Bradykinesia  Rigidity  Abnormality of gait      Subjective Assessment - 07/01/15 0849    Subjective Didn't sleep well last night, so I'm sore today.   Currently in Pain? Yes   Pain Score 1    Pain Location Back   Pain Orientation Mid;Lower   Pain Descriptors / Indicators Sore   Pain Type Acute pain   Pain Onset Today   Aggravating Factors  sleeping positioning         Therapeutic Exercise: -Low back flexibility exercise:   Anterior/posterior pelvic tilts x 10 reps, 2 sets -Trunk rotation rocking 5 reps for relaxation, then stretch to R and to L 3 x 30 seconds -Single knee to chest 3 reps x 15 seconds -Initiated as HEP                    [facility] Adult PT Treatment/Exercise - 07/01/15 0907    Bed Mobility   Bed Mobility Rolling Right;Rolling Left;Right Sidelying to Sit;Sit to Supine;Sit to Sidelying Right;Supine to Sit  Cues for bed mobility technique   Rolling Right 7: Independent   Rolling Left 7: Independent  cues provided for arm placement once on L side   Right Sidelying to Sit 5: Supervision   Supine to Sit 5: Supervision  prefers to come up through long sit, not sidelying to sit   Sit to Supine 7: Independent   Transfers   Transfers Sit to Stand;Stand to Sit   Sit to Stand 6: Modified independent (Device/Increase time)   Stand to Sit 6: Modified independent (Device/Increase time)   Comments After bed mobility practice and stretching, pt reports less pain   Ambulation/Gait   Ambulation/Gait Yes   Ambulation/Gait Assistance 7: Independent;6: Modified independent (Device/Increase time)   Ambulation  Distance (Feet) 400 Feet  then 600 ft   Assistive device None   Gait Pattern Decreased arm swing - right;Decreased arm swing - left;Step-through pattern;Decreased step length - right;Decreased step length - left;Narrow base of support;Trunk flexed;Decreased trunk rotation  Gait pattern improves with use of walking poles, cues   Ambulation Surface Level;Indoor;Other (comment)  Treadmill   Gait Comments Treadmill gait training 6 minutes 1>1.5 mph with bilateral UE support, cues for improved step length.  Pt performs kicking large therapy ball at 1 mph x 30 seocnds for improved intensity and amplitude of movement.          Discussed bed mobility technique and use of pillows between knees for sleeping on side for back/hip alignment.      PT Education - 07/01/15 1220    Education provided  Yes   Education Details HEP-see instructions; awareness of walking pattern, need for increased intensity and amplitude of movement   Person(s) Educated Patient   Methods Explanation;Demonstration;Handout   Comprehension Verbalized understanding;Returned demonstration          PT Short Term Goals - 12/31/14 1025    PT SHORT TERM GOAL #1   Title Pt will be independent with HEP for improved transfers, balance, and gait. (Target 12/30/14)   Status Partially Met   PT SHORT TERM GOAL #2   Title Pt will improve 5x sit<>stand to less than or equal to 11.5 seconds for improved transfer efficiency and safety.   Status Achieved   PT SHORT TERM GOAL #3   Title Pt will improve Functional Gait Assessment score to at least 18/30 for decreased fall risk.   Status Achieved   PT SHORT TERM GOAL #4   Title Pt will improve TUG score to less than or equal to 13.5 seconds for decreased fall risk.   Status Achieved   PT SHORT TERM GOAL #5   Title Pt will verbalize understanding of local Parkinson's-disease related resources.   Status Achieved           PT Long Term Goals - 06/26/15 0910    PT LONG TERM GOAL #1   Title Pt will be independent with progressive HEP for improved functional mobility and gait.  TARGET 07/25/15   Time 4   Period Weeks   Status New   PT LONG TERM GOAL #2   Title Pt will improve TUG score to less than or equal to 13.5 seconds for decreased fall risk.   Time 4   Period Weeks   Status New   PT LONG TERM GOAL #3   Title Pt will improve TUG cognitive score to less than or equal to 15 seconds for decreased fall risk and improved ability to multi-task.   Time 4   Period Weeks   Status New   PT LONG TERM GOAL #4   Title Pt will improve Functional Gait Assessment to at least 20/30 for decreased fall risk.   Time 4   Period Weeks   Status New   PT LONG TERM GOAL #5   Title Pt will improve 5x sit<>stand to less than or equal to 15 seconds for improved efficiency and safety  with transfers.   Time 4   Period Weeks   Status New   Additional Long Term Goals   Additional Long Term Goals Yes   PT LONG TERM GOAL #6   Title Pt will verbalize plans for continued community fitness upon D/C from PT.   Time 4   Period Weeks  Status New               Plan - 07/01/15 1223    Clinical Impression Statement Pt having minimal back pain today after not sleeping well.  Addressed positioning during sleeping and with bed mobility as well as lumbar flexibility exercises.  Pt responds well to cues for increased amplitude and intensity with gait; however, pt unsure he will be able to carryover at home, especially late at night after work.     Pt will benefit from skilled therapeutic intervention in order to improve on the following deficits Abnormal gait;Decreased balance;Decreased mobility;Decreased coordination;Decreased strength;Difficulty walking;Postural dysfunction   Rehab Potential Good   PT Frequency 2x / week   PT Duration 4 weeks  plus eval   PT Treatment/Interventions ADLs/Self Care Home Management;Therapeutic exercise;Therapeutic activities;Functional mobility training;Gait training;Balance training;Neuromuscular re-education;Patient/family education   PT Next Visit Plan Review HEP, review walking program, focus on intensity/large amplitude movement patterns   Consulted and Agree with Plan of Care Patient        Problem List Patient Active Problem List   Diagnosis Date Noted  . MDD (major depressive disorder), recurrent episode, severe 02/10/2014  . Nonspecific abnormal electrocardiogram (ECG) (EKG) 02/07/2014  . Non-compliant behavior 02/03/2014  . Morton's neuroma of left foot 08/07/2013  . 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 Major depressive disorder, recurrent episode, severe 03/13/2012  . Hyperglycemia 01/26/2012  . Hypogonadism male 01/24/2012  . Syncope and collapse 01/20/2012  .  OSA (obstructive sleep  apnea) 01/17/2011  . CHEST TIGHTNESS 02/18/2008    MARRIOTT,AMY W. 07/01/2015, 12:25 PM  Frazier Butt., PT [facility] [address] [place], [place], [address] Phone: [phone]   Fax:  [phone]

## 2015-07-01 NOTE — Therapy (Signed)
[facility] [address] [place], [place], [address] Phone: [phone]   Fax:  [phone]  Occupational Therapy Treatment  Patient Details  Name: [name] [name] MRN: [id number] Date of Birth: 1951-05-20 Referring Provider:  [name], *  Encounter Date: 07/01/2015      OT End of Session - 07/01/15 0832    Visit Number 2   Number of Visits 17   Authorization Type BCBS 75 visit limit combined, no auth, **56 visits remaining combined   OT Start Time 0805   OT Stop Time 0845   OT Time Calculation (min) 40 min   Activity Tolerance Patient tolerated treatment well   Behavior During Therapy [facility] for tasks assessed/performed      Past Medical History  Diagnosis Date  . Headache(784.0)   . Cardiac murmur     as a child  . Streptococcal meningitis     as an infant  . Depression   .  OSA (obstructive sleep apnea) 01/17/2011    npsg 2012:  AHI 67/hr. Auto titration 2012:  Optimal pressure 12cm.   Marland Kitchen HEADACHES, HX OF 02/18/2008    Qualifier: Diagnosis of  By: Danny Lawless CMA, [place]    . APPENDECTOMY, HX OF 02/18/2008    Qualifier: Diagnosis of  By: Danny Lawless CMA, [place]    . Parkinson disease 11/2014    Past Surgical History  Procedure Laterality Date  . Nasal sinus surgery      x 4 as a child  . Vasectomy    . Appendectomy  1967    There were no vitals filed for this visit.  Visit Diagnosis:  Bradykinesia  Rigidity  Decreased coordination  Cognitive deficits      Subjective Assessment - 07/01/15 0811    Subjective  (p) Pt reports mild back pain and stiffness   Pertinent History (p) dx PD 11/2014, hx of depression, mild cognitive impairment per recent neuropsych cognitive testing   Patient Stated Goals (p) pt reports incr tremors, slower walking, more freezing   Currently in Pain? (p) Yes   Pain Score (p) 2    Pain Location (p) Back   Pain Type (p) Chronic pain   Pain Onset (p) More than a  month ago   Aggravating Factors  (p) malpositioning        Treatment: Arm bike x 6 mins level 1 for conditioning, pt was able to maintain 40 RPM PWR! Basic 4 in seated, 20 reps exercises 1-3, 10 reps for PWR! Step, min v.c. And demonstration for big techniques.  Fine motor coordination activities:Flipping playing cards, dealing with thumb, followed by practice sorting and separating pieces of paper as pt reports this is a task pt has difficulty with at work. Pt required min v.c. and PWR! Hands prior to activity. Pt practiced opening a jar, and screwing/ unscrewing nuts and bolts with bilateral UE's min v.c. For big movements/ technique.                      OT Short Term Goals - 06/25/15 2147    OT SHORT TERM GOAL #1   Title Pt will be independent with updated HEP and appropriate community resources prn--STGs due 07/25/15   Time 4   Period Weeks   Status New   OT SHORT TERM GOAL #2   Title Pt will improve coordination for ADLs as shown by improving time on 9-hole peg test by at least 8sec with LUE.   Baseline  L-51.34sec   Time 4   Period Weeks   Status New   OT SHORT TERM GOAL #3   Title Pt will report incr ease with car transfers and bed mobility using stategies.   Time 4   Period Weeks   Status New   OT SHORT TERM GOAL #4   Title Pt will complete dressing fasteners mod I using strategies/AE prn.--due 12/30/14   Baseline pt reports difficulty tying shoes and sometimes buttons   Time 4   Period Weeks   Status New   OT SHORT TERM GOAL #5   Title Pt will verbalize understanding of cognitive compensation strategies/ways to promote cognitive skills prn.   Baseline pt reports incr difficulty with divided attention and memory   Time 4   Period Weeks   Status New           OT Long Term Goals - 06/25/15 2151    OT LONG TERM GOAL #1   Title Pt will verbalize understanding of updated AE/strategies for ADLs/IADLs prn.--check LTGs 08/24/15   Time 8   Period  Weeks   Status New   OT LONG TERM GOAL #2   Title Pt will improve coordination for ADLs as shown by improving time on 9-hole peg test by at least 15sec with LUE.   Baseline R-30.54sec, L-51.34sec   Time 8   Status New   OT LONG TERM GOAL #3   Title Pt will improve coordination/functional reaching as shown by improving score on box and blocks test by at least 5 with BUE   Baseline R-49 blocks, L-48 blocks   Time 8   Period Weeks   Status New   OT LONG TERM GOAL #4   Title Pt will improve balance/functional reaching for ADLs as shown by improving standing functional reach test to at least 11inches bilaterally.   Baseline 9" bilaterally   Time 8   Period Weeks   Status New   OT LONG TERM GOAL #5   Title Pt will improve dressing ability as shown by improving time on PPT#4 by at least 5sec.--due 01/29/15   Baseline 21.53sec   Time 8   Period Weeks   Status New   OT LONG TERM GOAL #6   Title -------------------------               Plan - 07/01/15 7829    Clinical Impression Statement Pt is progresing towards goals. Pt verbalizes understanding of importance of resuming PD specific HEP.   Pt will benefit from skilled therapeutic intervention in order to improve on the following deficits (Retired) Decreased coordination;Decreased endurance;Decreased activity tolerance;Impaired tone;Impaired UE functional use;Decreased knowledge of use of DME;Decreased balance;Decreased cognition;Decreased mobility   Rehab Potential Good   OT Frequency 2x / week   OT Duration 8 weeks   OT Treatment/Interventions Self-care/ADL training;Moist Heat;DME and/or AE instruction;Splinting;Patient/family education;Balance training;Therapeutic exercises;Therapeutic exercise;Therapeutic activities;Cognitive remediation/compensation;Passive range of motion;Functional Mobility Training;Neuromuscular education;Cryotherapy;Electrical Stimulation;Energy conservation;Manual Therapy   Plan continue to update/ review  previous HEP, pt to bing in notebook of exercises next [place] Has been issued:  PWR! moves seated, coordination/ PWR! hands HEP, PWR! moves (basic 4) in prone and quadraped (12/29/14) ways to incorporate big movments with ADLS. red theraband issued 01/13/15   Consulted and Agree with Plan of Care Patient        Problem List Patient Active Problem List   Diagnosis Date Noted  . MDD (major depressive disorder), recurrent episode, severe 02/10/2014  .  Nonspecific abnormal electrocardiogram (ECG) (EKG) 02/07/2014  . Non-compliant behavior 02/03/2014  . Morton's neuroma of left foot 08/07/2013  . 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 Major depressive disorder, recurrent episode, severe 03/13/2012  . Hyperglycemia 01/26/2012  . Hypogonadism male 01/24/2012  . Syncope and collapse 01/20/2012  .  OSA (obstructive sleep apnea) 01/17/2011  . CHEST TIGHTNESS 02/18/2008    RINE,KATHRYN 07/01/2015, 8:38 AM Theone Murdoch, OTR/L [phone] Phone: [phone] 8:39 AM 07/01/2015 [facility] [address] [place], [place], [address] Phone: [phone]   Fax:  [phone]

## 2015-07-07 ENCOUNTER — Ambulatory Visit: Payer: Federal, State, Local not specified - PPO | Admitting: Occupational Therapy

## 2015-07-07 ENCOUNTER — Ambulatory Visit: Payer: Self-pay | Admitting: Physical Therapy

## 2015-07-08 ENCOUNTER — Encounter: Payer: Self-pay | Admitting: Speech Pathology

## 2015-07-08 ENCOUNTER — Encounter: Payer: Self-pay | Admitting: Occupational Therapy

## 2015-07-08 ENCOUNTER — Ambulatory Visit: Payer: Self-pay | Admitting: Physical Therapy

## 2015-07-11 ENCOUNTER — Other Ambulatory Visit (HOSPITAL_COMMUNITY): Payer: Self-pay | Admitting: Psychiatry

## 2015-07-15 ENCOUNTER — Ambulatory Visit: Payer: Federal, State, Local not specified - PPO | Admitting: Occupational Therapy

## 2015-07-15 ENCOUNTER — Ambulatory Visit: Payer: Federal, State, Local not specified - PPO | Attending: Neurology | Admitting: Physical Therapy

## 2015-07-15 ENCOUNTER — Ambulatory Visit: Payer: Federal, State, Local not specified - PPO

## 2015-07-15 DIAGNOSIS — R4189 Other symptoms and signs involving cognitive functions and awareness: Secondary | ICD-10-CM | POA: Insufficient documentation

## 2015-07-15 DIAGNOSIS — R278 Other lack of coordination: Secondary | ICD-10-CM

## 2015-07-15 DIAGNOSIS — R269 Unspecified abnormalities of gait and mobility: Secondary | ICD-10-CM | POA: Diagnosis present

## 2015-07-15 DIAGNOSIS — R4701 Aphasia: Secondary | ICD-10-CM | POA: Diagnosis present

## 2015-07-15 DIAGNOSIS — Z7409 Other reduced mobility: Secondary | ICD-10-CM | POA: Diagnosis present

## 2015-07-15 DIAGNOSIS — R258 Other abnormal involuntary movements: Secondary | ICD-10-CM | POA: Insufficient documentation

## 2015-07-15 DIAGNOSIS — R490 Dysphonia: Secondary | ICD-10-CM

## 2015-07-15 DIAGNOSIS — R2681 Unsteadiness on feet: Secondary | ICD-10-CM | POA: Insufficient documentation

## 2015-07-15 DIAGNOSIS — R293 Abnormal posture: Secondary | ICD-10-CM | POA: Diagnosis present

## 2015-07-15 DIAGNOSIS — R29898 Other symptoms and signs involving the musculoskeletal system: Secondary | ICD-10-CM

## 2015-07-15 DIAGNOSIS — R279 Unspecified lack of coordination: Secondary | ICD-10-CM | POA: Diagnosis present

## 2015-07-15 DIAGNOSIS — R1310 Dysphagia, unspecified: Secondary | ICD-10-CM | POA: Diagnosis present

## 2015-07-15 NOTE — Patient Instructions (Signed)
WALKING  Walking is a great form of exercise to increase your strength, endurance and overall fitness.  A walking program can help you start slowly and gradually build endurance as you go.  Everyone's ability is different, so each person's starting point will be different.  You do not have to follow them exactly.  The are just samples. You should simply find out what's right for you and stick to that program.   In the beginning, you'll start off walking 2-3 times a day for short distances.  As you get stronger, you'll be walking further at just 1-2 times per day.  A. You Can Walk For A Certain Length Of Time Each Day    Walk 5 minutes 3 times per day.  Increase 2 minutes every 2 days (3 times per day).  Work up to 25-30 minutes (1-2 times per day).   Example:   Day 1-2 5 minutes 3 times per day   Day 7-8 12 minutes 2-3 times per day   Day 13-14 25 minutes 1-2 times per day  B. You Can Walk For a Certain Distance Each Day     Distance can be substituted for time.    Example:   3 trips to mailbox (at road)   3 trips to corner of block   3 trips around the block   

## 2015-07-15 NOTE — Patient Instructions (Signed)
Practice TWO sets of loud /a/ (5 reps) each day to help you with maintaining louder speech.

## 2015-07-15 NOTE — Therapy (Addendum)
[facility] [address] [place], [place], [address] Phone: [phone]   Fax:  [phone]  Occupational Therapy Treatment  Patient Details  Name: [name] [name] MRN: [id number] Date of Birth: Aug 16, 1951 Referring Provider:  [name], *  Encounter Date: 07/15/2015      OT End of Session - 07/15/15 1056    Visit Number 3   Date for OT Re-Evaluation 08/25/15   Authorization Type BCBS 75 visit limit combined, no auth, **56 visits remaining combined   OT Start Time 0935   OT Stop Time 1018   OT Time Calculation (min) 43 min   Activity Tolerance Patient tolerated treatment well   Behavior During Therapy [facility] for tasks assessed/performed      Past Medical History  Diagnosis Date  . Headache(784.0)   . Cardiac murmur     as a child  . Streptococcal meningitis     as an infant  . Depression   .  OSA (obstructive sleep apnea) 01/17/2011    npsg 2012:  AHI 67/hr. Auto titration 2012:  Optimal pressure 12cm.   Marland Kitchen HEADACHES, HX OF 02/18/2008    Qualifier: Diagnosis of  By: Danny Lawless CMA, [place]    . APPENDECTOMY, HX OF 02/18/2008    Qualifier: Diagnosis of  By: Danny Lawless CMA, [place]    . Parkinson disease 11/2014    Past Surgical History  Procedure Laterality Date  . Nasal sinus surgery      x 4 as a child  . Vasectomy    . Appendectomy  1967    There were no vitals filed for this visit.  Visit Diagnosis:  Bradykinesia  Rigidity  Decreased coordination  Cognitive deficits      Subjective Assessment - 07/15/15 1104    Subjective  Pt reports his mother in law passed away   Pertinent History dx PD 11/2014, hx of depression, mild cognitive impairment per recent neuropsych cognitive testing   Patient Stated Goals pt reports incr tremors, slower walking, more freezing   Currently in Pain? No/denies        Treatment: Reviewed PWR seated exercises basic 4, 10-20 reps each exercise, min v.c. and  demonstration required. Therapist discussed impact of cognitive deficits on driving and importance of not driving continuously for long time periods, importance of rest breaks and minimizing distractions.(Pt reports driving 7 hours without switching out with his wife). Pt reports increased short term memory deficits. Therapist provided education regarding Memory compensations and keeping thinking skills sharp. Pt reports he has not returned to work since the funeral because of the way he feels. He does not feel as though he can do his job well right now. Therapist encouraged pt to walk, ride his stationary bike and to perform seated PWR! Exercises in the meantime.                    OT Education - 07/15/15 1051    Education provided Yes   Education Details Keeping thinking skills sharp, memory compensations, importance of limitating distractions when driving and not driving for long time periods, importance of regular exercise   Person(s) Educated Patient   Methods Explanation;Verbal cues;Handout   Comprehension Verbalized understanding          OT Short Term Goals - 06/25/15 2147    OT SHORT TERM GOAL #1   Title Pt will be independent with updated HEP and appropriate community resources prn--STGs due 07/25/15   Time 4   Period  Weeks   Status New   OT SHORT TERM GOAL #2   Title Pt will improve coordination for ADLs as shown by improving time on 9-hole peg test by at least 8sec with LUE.   Baseline L-51.34sec   Time 4   Period Weeks   Status New   OT SHORT TERM GOAL #3   Title Pt will report incr ease with car transfers and bed mobility using stategies.   Time 4   Period Weeks   Status New   OT SHORT TERM GOAL #4   Title Pt will complete dressing fasteners mod I using strategies/AE prn.--due 12/30/14   Baseline pt reports difficulty tying shoes and sometimes buttons   Time 4   Period Weeks   Status New   OT SHORT TERM GOAL #5   Title Pt will verbalize  understanding of cognitive compensation strategies/ways to promote cognitive skills prn.   Baseline pt reports incr difficulty with divided attention and memory   Time 4   Period Weeks   Status New           OT Long Term Goals - 06/25/15 2151    OT LONG TERM GOAL #1   Title Pt will verbalize understanding of updated AE/strategies for ADLs/IADLs prn.--check LTGs 08/24/15   Time 8   Period Weeks   Status New   OT LONG TERM GOAL #2   Title Pt will improve coordination for ADLs as shown by improving time on 9-hole peg test by at least 15sec with LUE.   Baseline R-30.54sec, L-51.34sec   Time 8   Status New   OT LONG TERM GOAL #3   Title Pt will improve coordination/functional reaching as shown by improving score on box and blocks test by at least 5 with BUE   Baseline R-49 blocks, L-48 blocks   Time 8   Period Weeks   Status New   OT LONG TERM GOAL #4   Title Pt will improve balance/functional reaching for ADLs as shown by improving standing functional reach test to at least 11inches bilaterally.   Baseline 9" bilaterally   Time 8   Period Weeks   Status New   OT LONG TERM GOAL #5   Title Pt will improve dressing ability as shown by improving time on PPT#4 by at least 5sec.--due 01/29/15   Baseline 21.53sec   Time 8   Period Weeks   Status New   OT LONG TERM GOAL #6   Title -------------------------               Plan - 07/15/15 1053    Clinical Impression Statement Pt has been out of town since his mother in law passed away. Pt reports increased short term memory deficits and not feeling as though he is moving as well.   Pt will benefit from skilled therapeutic intervention in order to improve on the following deficits (Retired) Decreased coordination;Decreased endurance;Decreased activity tolerance;Impaired tone;Impaired UE functional use;Decreased knowledge of use of DME;Decreased balance;Decreased cognition;Decreased mobility   Rehab Potential Good   OT Frequency  2x / week   OT Duration 8 weeks   OT Treatment/Interventions Self-care/ADL training;Moist Heat;DME and/or AE instruction;Splinting;Patient/family education;Balance training;Therapeutic exercises;Therapeutic exercise;Therapeutic activities;Cognitive remediation/compensation;Passive range of motion;Functional Mobility Training;Neuromuscular education;Cryotherapy;Electrical Stimulation;Energy conservation;Manual Therapy   Plan update/ review HEP, pt to bring in notebook next visit   OT Home Exercise Plan issued 07/15/15:memory compensations/ keeping thinking skills sharp   Consulted and Agree with Plan of Care Patient  Problem List Patient Active Problem List   Diagnosis Date Noted  . MDD (major depressive disorder), recurrent episode, severe ([place]) 02/10/2014  . Nonspecific abnormal electrocardiogram (ECG) (EKG) 02/07/2014  . Non-compliant behavior 02/03/2014  . Morton's neuroma of left foot 08/07/2013  . 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 Major depressive disorder, recurrent episode, severe ([place]) 03/13/2012  . Hyperglycemia 01/26/2012  . Hypogonadism male 01/24/2012  . Syncope and collapse 01/20/2012  .  OSA (obstructive sleep apnea) 01/17/2011  . CHEST TIGHTNESS 02/18/2008    RINE,KATHRYN 07/15/2015, 11:04 AM Theone Murdoch, OTR/L [phone] Phone: [phone] 11:04 AM 07/15/2015 [facility] [address] [place], [place], [address] Phone: [phone]   Fax:  [phone]

## 2015-07-15 NOTE — Therapy (Signed)
[facility] [address] [place], [place], [address] Phone: [phone]   Fax:  [phone]  Speech Language Pathology Treatment  Patient Details  Name: [name] [name] MRN: [id number] Date of Birth: 1950-10-16 Referring Provider:  [name], *  Encounter Date: 07/15/2015      End of Session - 07/15/15 1222    Visit Number 2   Number of Visits 16   Date for SLP Re-Evaluation 08/25/15   Authorization Type 56 visits remain as of 06-25-15   Authorization - Visit Number 1   Authorization - Number of Visits 18   SLP Start Time 1517   SLP Stop Time  1100   SLP Time Calculation (min) 41 min   Activity Tolerance Patient tolerated treatment well      Past Medical History  Diagnosis Date  . Headache(784.0)   . Cardiac murmur     as a child  . Streptococcal meningitis     as an infant  . Depression   .  OSA (obstructive sleep apnea) 01/17/2011    npsg 2012:  AHI 67/hr. Auto titration 2012:  Optimal pressure 12cm.   Marland Kitchen HEADACHES, HX OF 02/18/2008    Qualifier: Diagnosis of  By: Danny Lawless CMA, [place]    . APPENDECTOMY, HX OF 02/18/2008    Qualifier: Diagnosis of  By: Danny Lawless CMA, [place]    . Parkinson disease 11/2014    Past Surgical History  Procedure Laterality Date  . Nasal sinus surgery      x 4 as a child  . Vasectomy    . Appendectomy  1967    There were no vitals filed for this visit.  Visit Diagnosis: Hypokinetic Parkinsonian dysphonia      Subjective Assessment - 07/15/15 1026    Subjective Pt has noticed his speech has not improved in conversation since eval.               ADULT SLP TREATMENT - 07/15/15 1028    General Information   Behavior/Cognition Alert;Cooperative;Pleasant mood   Treatment Provided   Treatment provided Cognitive-Linquistic   Pain Assessment   Pain Assessment No/denies pain   Cognitive-Linquistic Treatment   Treatment focused on Dysarthria   Skilled  Treatment SLP targeted recalibrating pt's conversational speaking volume with loud /a/ at average 92dB. In structured multiple sentence tasks pt maintained 70dB approx 85% of the time. Pt unaware of difference between softer than WNL speech responses and WNL volume responses.   Assessment / Recommendations / Plan   Plan Continue with current plan of care   Progression Toward Goals   Progression toward goals Progressing toward goals            SLP Short Term Goals - 07/15/15 1224    SLP SHORT TERM GOAL #1   Title Pt will produce loud /a/ average 88dB over 4 sessions   Time 4   Period Weeks   Status On-going   SLP SHORT TERM GOAL #2   Title Pt will demonstrate average of 70dB for in 10 minutes of mod complex conversaiton outside therapy room over two sessions   Time 4   Period Weeks   Status On-going   SLP SHORT TERM GOAL #3   Title pt will demo knowledge of s/s aspiration with modified independence   Time 4   Period Weeks   Status On-going          SLP Long Term Goals - 07/15/15 1225    SLP LONG  TERM GOAL #1   Title pt to maintain average 70dB in 15 minutes mod complex conversation outside [place] room over three sessions   Time 8   Period Weeks  (or 16 visits)   Status On-going          Plan - 07/15/15 1222    Clinical Impression Statement Pt was only completing loud /a/ 5 reps/day. SLP emphasized 5 reps BID and provided ideas of how to accopmlish this. Pt unaware of difference in feeling of louder WNL speech and softer than WNL speech at this time. Skilled ST needed to cont to incr pt's awareness of appropriate loudness during conversation and work on carryover of louder speech from [place] room to outside [place].   Speech Therapy Frequency 2x / week   Duration --  8 weeks    Treatment/Interventions SLP instruction and feedback;Internal/external aids;Patient/family education;Functional tasks;Compensatory strategies   Potential to Achieve Goals Good   Potential Considerations  Severity of impairments        Problem List Patient Active Problem List   Diagnosis Date Noted  . MDD (major depressive disorder), recurrent episode, severe ([place]) 02/10/2014  . Nonspecific abnormal electrocardiogram (ECG) (EKG) 02/07/2014  . Non-compliant behavior 02/03/2014  . Morton's neuroma of left foot 08/07/2013  . 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 Major depressive disorder, recurrent episode, severe ([place]) 03/13/2012  . Hyperglycemia 01/26/2012  . Hypogonadism male 01/24/2012  . Syncope and collapse 01/20/2012  .  OSA (obstructive sleep apnea) 01/17/2011  . CHEST TIGHTNESS 02/18/2008    SCHINKE,CARL , MS, CCC-SLP  07/15/2015, 12:26 PM  [facility] [address] [place], [place], [address] Phone: [phone]   Fax:  [phone]

## 2015-07-15 NOTE — Therapy (Signed)
[facility] [address] [place], [place], [address] Phone: [phone]   Fax:  [phone]  Physical Therapy Treatment  Patient Details  Name: [name] [name] MRN: [id number] Date of Birth: 12-Jul-1951 Referring Provider:  [name], *  Encounter Date: 07/15/2015      PT End of Session - 07/15/15 0941    Visit Number 3   Number of Visits 9   Authorization Type BCBS 75 visit limit combined (56 remain per eval note-PT to check with insurance coordinator)-17 were used by PT previously; 50 ttoal used this year; avoid going over 25 visits this bout of therapy-07/15/15 AWM per email with KG   PT Start Time 0851   PT Stop Time 0931   PT Time Calculation (min) 40 min   Activity Tolerance Patient tolerated treatment well   Behavior During Therapy [facility] for tasks assessed/performed      Past Medical History  Diagnosis Date  . Headache(784.0)   . Cardiac murmur     as a child  . Streptococcal meningitis     as an infant  . Depression   .  OSA (obstructive sleep apnea) 01/17/2011    npsg 2012:  AHI 67/hr. Auto titration 2012:  Optimal pressure 12cm.   Marland Kitchen HEADACHES, HX OF 02/18/2008    Qualifier: Diagnosis of  By: Danny Lawless CMA, [place]    . APPENDECTOMY, HX OF 02/18/2008    Qualifier: Diagnosis of  By: Danny Lawless CMA, [place]    . Parkinson disease 11/2014    Past Surgical History  Procedure Laterality Date  . Nasal sinus surgery      x 4 as a child  . Vasectomy    . Appendectomy  1967    There were no vitals filed for this visit.  Visit Diagnosis:  Bradykinesia  Abnormality of gait  Posture abnormality      Subjective Assessment - 07/15/15 0854    Subjective Was out of town due to mother-in-law passing away; had to go to [place].  Seems that I'm having more freezing up episodes-happens randomly.   Currently in Pain? No/denies                         [facility] Adult PT  Treatment/Exercise - 07/15/15 0858    Ambulation/Gait   Ambulation/Gait Yes   Ambulation/Gait Assistance 7: Independent   Ambulation Distance (Feet) 400 Feet  indoors, then 600 ft outdoors    Assistive device None   Gait Pattern Decreased arm swing - right;Decreased arm swing - left;Step-through pattern;Decreased step length - right;Decreased step length - left;Narrow base of support;Trunk flexed;Decreased trunk rotation   Ambulation Surface Level;Indoor;Unlevel;Outdoor   Gait Comments Gait activities on level ground x 350 ft with stop/starts with cues for staggered stance foot position for rocking forward/back to initiate weightshift to start walking again.   High Level Balance   High Level Balance Comments At counter-lateral weigthshifting 15 reps, then stagger stance anterior/posterior weigthshifting x 15 reps each, with added single arm swing (intermittent UE support with one UE) to use as means of initiating gait from standing and from gait positions when he experiences freezing episodes   Exercises   Exercises Lumbar   Lumbar Exercises: Stretches   Single Knee to Chest Stretch 3 reps;20 seconds   Pelvic Tilt Other (comment)  10 reps   Lumbar Exercises: Supine   Other Supine Lumbar Exercises Trunk rotation rocking x 10 reps, then 3 reps 30 second  hold    Self Care:  Brief review of HEP given last visit-as noted above-pt return demo understanding of HEP. -Discussed pt's reports of increased tremor and less ease of movement/increased freezing episodes in relation to increased stressors in patient's life (mother in law passing, having to drive to and from [place]) -Provided patient with walking program progression for addition of walking program to HEP routine.            PT Education - 07/15/15 0940    Education provided Yes   Education Details Walking program; use of lateral and anterior/posterior weightshifting as means to prevent/less freezing episode   Person(s) Educated  Patient   Methods Explanation;Demonstration;Handout   Comprehension Returned demonstration;Verbalized understanding;Need further instruction          PT Short Term Goals - 12/31/14 1025    PT SHORT TERM GOAL #1   Title Pt will be independent with HEP for improved transfers, balance, and gait. (Target 12/30/14)   Status Partially Met   PT SHORT TERM GOAL #2   Title Pt will improve 5x sit<>stand to less than or equal to 11.5 seconds for improved transfer efficiency and safety.   Status Achieved   PT SHORT TERM GOAL #3   Title Pt will improve Functional Gait Assessment score to at least 18/30 for decreased fall risk.   Status Achieved   PT SHORT TERM GOAL #4   Title Pt will improve TUG score to less than or equal to 13.5 seconds for decreased fall risk.   Status Achieved   PT SHORT TERM GOAL #5   Title Pt will verbalize understanding of local Parkinson's-disease related resources.   Status Achieved           PT Long Term Goals - 06/26/15 0910    PT LONG TERM GOAL #1   Title Pt will be independent with progressive HEP for improved functional mobility and gait.  TARGET 07/25/15   Time 4   Period Weeks   Status New   PT LONG TERM GOAL #2   Title Pt will improve TUG score to less than or equal to 13.5 seconds for decreased fall risk.   Time 4   Period Weeks   Status New   PT LONG TERM GOAL #3   Title Pt will improve TUG cognitive score to less than or equal to 15 seconds for decreased fall risk and improved ability to multi-task.   Time 4   Period Weeks   Status New   PT LONG TERM GOAL #4   Title Pt will improve Functional Gait Assessment to at least 20/30 for decreased fall risk.   Time 4   Period Weeks   Status New   PT LONG TERM GOAL #5   Title Pt will improve 5x sit<>stand to less than or equal to 15 seconds for improved efficiency and safety with transfers.   Time 4   Period Weeks   Status New   Additional Long Term Goals   Additional Long Term Goals Yes   PT  LONG TERM GOAL #6   Title Pt will verbalize plans for continued community fitness upon D/C from PT.   Time 4   Period Weeks   Status New               Plan - 07/15/15 0943    Clinical Impression Statement Pt has been absent from therapy due to mother-in-law passing away.  Pt reports increased tremors and overall not feeling well recently.  Pt has difficulty sequencing anterior/posterior weightshifting activities today.  Pt will continue to benefit from further skilled PT to address balance, gait and functional mobility.   Pt will benefit from skilled therapeutic intervention in order to improve on the following deficits Abnormal gait;Decreased balance;Decreased mobility;Decreased coordination;Decreased strength;Difficulty walking;Postural dysfunction   Rehab Potential Good   PT Frequency 2x / week   PT Duration 4 weeks  plus eval   PT Treatment/Interventions ADLs/Self Care Home Management;Therapeutic exercise;Therapeutic activities;Functional mobility training;Gait training;Balance training;Neuromuscular re-education;Patient/family education   PT Next Visit Plan Review/follow up on walking program, weightshifting activities and education on strategies to reduce freezing; per pt request-work on transfers-varied heights and turning to sit safely   Consulted and Agree with Plan of Care Patient        Problem List Patient Active Problem List   Diagnosis Date Noted  . MDD (major depressive disorder), recurrent episode, severe ([place]) 02/10/2014  . Nonspecific abnormal electrocardiogram (ECG) (EKG) 02/07/2014  . Non-compliant behavior 02/03/2014  . Morton's neuroma of left foot 08/07/2013  . 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 Major depressive disorder, recurrent episode, severe ([place]) 03/13/2012  . Hyperglycemia 01/26/2012  . Hypogonadism male 01/24/2012  . Syncope and collapse 01/20/2012  .  OSA (obstructive sleep apnea) 01/17/2011  . CHEST  TIGHTNESS 02/18/2008    Joelle Flessner W. 07/15/2015, 9:47 AM Frazier Butt., PT [place] [place] [facility] [address] [place], [place], [address] Phone: [phone]   Fax:  [phone]

## 2015-07-15 NOTE — Patient Instructions (Signed)
Keeping Thinking Skills Sharp: 1. Jigsaw puzzles 2. Card/board games 3. Talking on the phone/social events 4. Lumosity.com 5. Online games 6. Word serches/crossword puzzles 7.  Logic puzzles 8. Aerobic exercise (stationary bike) 9. Eating balanced diet (fruits & veggies) 10. Drink water 11. Try something new--new recipe, hobby 12. Crafts 13. Do a variety of activities that are challenging 14. Add cognitive activities to walking/exercising (think of animal/food/city with each letter of the alphabet, counting backwards, thinking of as many vegetables as you can, etc.).--Only do this  If safe (no freezing/falls). Memory Compensation Strategies  1. Use "WARM" strategy.  W= write it down  A= associate it  R= repeat it  M= make a mental note  2.   You can keep a Memory Notebook.  Use a 3-ring notebook with sections for the following: calendar, important names and phone numbers,  medications, doctors' names/phone numbers, lists/reminders, and a section to journal what you did  each day.   3.    Use a calendar to write appointments down.  4.    Write yourself a schedule for the day.  This can be placed on the calendar or in a separate section of the Memory Notebook.  Keeping a  regular schedule can help memory.  5.    Use medication organizer with sections for each day or morning/evening pills.  You may need help loading it  6.    Keep a basket, or pegboard by the door.  Place items that you need to take out with you in the basket or on the pegboard.  You may also want to  include a message board for reminders.  7.    Use sticky notes.  Place sticky notes with reminders in a place where the task is performed.  For example: " turn off the  stove" placed by the stove, "lock the door" placed on the door at eye level, " take your medications" on  the bathroom mirror or by the place where you normally take your medications.  8.    Use alarms/timers.  Use while cooking to remind yourself  to check on food or as a reminder to take your medicine, or as a  reminder to make a call, or as a reminder to perform another task, etc.  

## 2015-07-16 ENCOUNTER — Ambulatory Visit: Payer: Federal, State, Local not specified - PPO | Admitting: Occupational Therapy

## 2015-07-16 DIAGNOSIS — R258 Other abnormal involuntary movements: Secondary | ICD-10-CM

## 2015-07-16 DIAGNOSIS — R29898 Other symptoms and signs involving the musculoskeletal system: Secondary | ICD-10-CM

## 2015-07-16 DIAGNOSIS — R279 Unspecified lack of coordination: Secondary | ICD-10-CM

## 2015-07-16 DIAGNOSIS — R4189 Other symptoms and signs involving cognitive functions and awareness: Secondary | ICD-10-CM

## 2015-07-16 DIAGNOSIS — R278 Other lack of coordination: Secondary | ICD-10-CM

## 2015-07-16 NOTE — Therapy (Signed)
[facility] [address] [place], [place], [address] Phone: [phone]   Fax:  [phone]  Occupational Therapy Treatment  Patient Details  Name: [name] [name] MRN: [id number] Date of Birth: 29-Oct-1950 Referring Provider:  [name], *  Encounter Date: 07/16/2015      OT End of Session - 07/16/15 1121    Visit Number 4   Number of Visits 17   Date for OT Re-Evaluation 08/25/15   Authorization Type BCBS 75 visit limit combined, no auth, **56 visits remaining combined   OT Start Time 0935   OT Stop Time 1015   OT Time Calculation (min) 40 min   Activity Tolerance Patient tolerated treatment well   Behavior During Therapy [facility] for tasks assessed/performed      Past Medical History  Diagnosis Date  . Headache(784.0)   . Cardiac murmur     as a child  . Streptococcal meningitis     as an infant  . Depression   .  OSA (obstructive sleep apnea) 01/17/2011    npsg 2012:  AHI 67/hr. Auto titration 2012:  Optimal pressure 12cm.   Marland Kitchen HEADACHES, HX OF 02/18/2008    Qualifier: Diagnosis of  By: Danny Lawless CMA, [place]    . APPENDECTOMY, HX OF 02/18/2008    Qualifier: Diagnosis of  By: Danny Lawless CMA, [place]    . Parkinson disease 11/2014    Past Surgical History  Procedure Laterality Date  . Nasal sinus surgery      x 4 as a child  . Vasectomy    . Appendectomy  1967    There were no vitals filed for this visit.  Visit Diagnosis:  Bradykinesia  Rigidity  Decreased coordination  Cognitive deficits      Subjective Assessment - 07/16/15 1014    Pertinent History dx PD 11/2014, hx of depression, mild cognitive impairment per recent neuropsych cognitive testing   Patient Stated Goals pt reports incr tremors, slower walking, more freezing   Currently in Pain? No/denies       Treatment: Arm bike x 6 mins level 1 for conditioning, pt able to maintain 40 RPM PWR! basic 4 in supine x 10 reps each,  min v.c./ demonstration initially, (this was previously issued yet pt. has not been performing.) Therapist reinforced how this could assist with bed mobility, and had pt perform PWR! twist to roll to the side of bed to sit up. Reviewed PWR! Hands 5-10 reps of each exercise, min v.c. And demonstration required. Pt returned demonstration of PWR! Supine and PWR! Hands.                          OT Short Term Goals - 07/16/15 0949    OT SHORT TERM GOAL #1   Title Pt will be independent with updated HEP and appropriate community resources prn--STGs due 07/25/15   Time 4   Period Weeks   Status On-going   OT SHORT TERM GOAL #2   Title Pt will improve coordination for ADLs as shown by improving time on 9-hole peg test by at least 8sec with LUE.   Baseline L-51.34sec   Time 4   Period Weeks   Status On-going   OT SHORT TERM GOAL #3   Title Pt will report incr ease with car transfers and bed mobility using stategies.   Time 4   Period Weeks   Status On-going   OT SHORT TERM GOAL #4  Title Pt will complete dressing fasteners mod I using strategies/AE prn.--due 12/30/14   Baseline pt reports difficulty tying shoes and sometimes buttons   Time 4   Period Weeks   Status On-going   OT SHORT TERM GOAL #5   Title Pt will verbalize understanding of cognitive compensation strategies/ways to promote cognitive skills prn.   Baseline pt reports incr difficulty with divided attention and memory   Time 4   Period Weeks   Status On-going           OT Long Term Goals - 06/25/15 2151    OT LONG TERM GOAL #1   Title Pt will verbalize understanding of updated AE/strategies for ADLs/IADLs prn.--check LTGs 08/24/15   Time 8   Period Weeks   Status New   OT LONG TERM GOAL #2   Title Pt will improve coordination for ADLs as shown by improving time on 9-hole peg test by at least 15sec with LUE.   Baseline R-30.54sec, L-51.34sec   Time 8   Status New   OT LONG TERM GOAL #3    Title Pt will improve coordination/functional reaching as shown by improving score on box and blocks test by at least 5 with BUE   Baseline R-49 blocks, L-48 blocks   Time 8   Period Weeks   Status New   OT LONG TERM GOAL #4   Title Pt will improve balance/functional reaching for ADLs as shown by improving standing functional reach test to at least 11inches bilaterally.   Baseline 9" bilaterally   Time 8   Period Weeks   Status New   OT LONG TERM GOAL #5   Title Pt will improve dressing ability as shown by improving time on PPT#4 by at least 5sec.--due 01/29/15   Baseline 21.53sec   Time 8   Period Weeks   Status New   OT LONG TERM GOAL #6   Title -------------------------               Plan - 07/16/15 1014    Clinical Impression Statement Pt is progressing towards goals. He demonstrates understanding of supine PWR! exercises and PWR! hands.   Pt will benefit from skilled therapeutic intervention in order to improve on the following deficits (Retired) Decreased coordination;Decreased endurance;Decreased activity tolerance;Impaired tone;Impaired UE functional use;Decreased knowledge of use of DME;Decreased balance;Decreased cognition;Decreased mobility   OT Frequency 2x / week   OT Duration 8 weeks   OT Treatment/Interventions Self-care/ADL training;Moist Heat;DME and/or AE instruction;Splinting;Patient/family education;Balance training;Therapeutic exercises;Therapeutic exercise;Therapeutic activities;Cognitive remediation/compensation;Passive range of motion;Functional Mobility Training;Neuromuscular education;Cryotherapy;Electrical Stimulation;Energy conservation;Manual Therapy   Plan  discussion regarding work vs. diasbility, coordination activities, progress HEP.   OT Home Exercise Plan issued 07/15/15:memory compensations/ keeping thinking skills sharp, reviewed supine PWR!, PWR! Hands!   Consulted and Agree with Plan of Care Patient        Problem List Patient Active  Problem List   Diagnosis Date Noted  . MDD (major depressive disorder), recurrent episode, severe ([place]) 02/10/2014  . Nonspecific abnormal electrocardiogram (ECG) (EKG) 02/07/2014  . Non-compliant behavior 02/03/2014  . Morton's neuroma of left foot 08/07/2013  . 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 Major depressive disorder, recurrent episode, severe ([place]) 03/13/2012  . Hyperglycemia 01/26/2012  . Hypogonadism male 01/24/2012  . Syncope and collapse 01/20/2012  .  OSA (obstructive sleep apnea) 01/17/2011  . CHEST TIGHTNESS 02/18/2008    RINE,KATHRYN 07/16/2015, 11:34 AM Theone Murdoch, OTR/L [phone] Phone: [phone] 11:34 AM 07/16/2015  [facility] [address] [place], [place], [address] Phone: [phone]   Fax:  [phone]

## 2015-07-17 ENCOUNTER — Ambulatory Visit: Payer: Federal, State, Local not specified - PPO

## 2015-07-17 ENCOUNTER — Telehealth: Payer: Self-pay | Admitting: Internal Medicine

## 2015-07-17 ENCOUNTER — Ambulatory Visit: Payer: Federal, State, Local not specified - PPO | Admitting: Physical Therapy

## 2015-07-17 DIAGNOSIS — R490 Dysphonia: Secondary | ICD-10-CM

## 2015-07-17 DIAGNOSIS — R258 Other abnormal involuntary movements: Secondary | ICD-10-CM

## 2015-07-17 NOTE — Patient Instructions (Addendum)
Getting Into / Out of Bed    Lower self to lie down on one side by raising legs and lowering head at the same time. Use arms to assist moving without twisting. Bend both knees to roll onto back if desired. To sit up, start from lying on side, and use same move-ments in reverse. Keep trunk aligned with legs.    Copyright  VHI. All rights reserved.    Sit to Stand Transfers:  1. Scoot out to the edge of the chair 2. Place your feet flat on the floor, shoulder width apart.  Make sure your feet are tucked just under your knees. 3. Lean forward (nose over toes) with momentum, and stand up tall with your best posture.  If you need to use your arms, use them as a quick boost up to stand. 4. If you are in a low or soft chair, you can lean back and then forward up to stand, in order to get more momentum. 5. Once you are standing, make sure you are looking ahead and standing tall.  To sit down:  1. Back up until you feel the chair behind your legs. 2. Bend at you hips, reaching  Back for you chair, if needed, then slowly squat to sit down on your chair.

## 2015-07-17 NOTE — Patient Instructions (Signed)
Focus on louder speech in all conversations over the weekend. Keep that volume up!

## 2015-07-17 NOTE — Therapy (Signed)
[facility] [address] [place], [place], [address] Phone: [phone]   Fax:  [phone]  Physical Therapy Treatment  Patient Details  Name: [name] [name] MRN: [id number] Date of Birth: 07/06/1951 No Data Recorded  Encounter Date: 07/17/2015      PT End of Session - 07/17/15 1237    Visit Number 4   Number of Visits 9   Date for PT Re-Evaluation 08/24/15   Authorization Type BCBS 75 visit limit combined (56 remain per eval note-PT to check with insurance coordinator)-17 were used by PT previously; 50 ttoal used this year; avoid going over 25 visits this bout of therapy-07/15/15 AWM per email with KG   PT Start Time 1018   PT Stop Time 1100   PT Time Calculation (min) 42 min   Activity Tolerance Patient tolerated treatment well   Behavior During Therapy [facility] for tasks assessed/performed      Past Medical History  Diagnosis Date  . Headache(784.0)   . Cardiac murmur     as a child  . Streptococcal meningitis     as an infant  . Depression   .  OSA (obstructive sleep apnea) 01/17/2011    npsg 2012:  AHI 67/hr. Auto titration 2012:  Optimal pressure 12cm.   Marland Kitchen HEADACHES, HX OF 02/18/2008    Qualifier: Diagnosis of  By: Danny Lawless CMA, [place]    . APPENDECTOMY, HX OF 02/18/2008    Qualifier: Diagnosis of  By: Danny Lawless CMA, [place]    . Parkinson disease 11/2014    Past Surgical History  Procedure Laterality Date  . Nasal sinus surgery      x 4 as a child  . Vasectomy    . Appendectomy  1967    There were no vitals filed for this visit.  Visit Diagnosis:  Bradykinesia      Subjective Assessment - 07/17/15 1020    Subjective Feeling better today-planning to go back to work on Tuesday   Currently in Pain? No/denies                         [facility] Adult PT Treatment/Exercise - 07/17/15 1022    Bed Mobility   Bed Mobility Right Sidelying to Sit;Supine to Sit;Sit to Supine;Sit to  Sidelying Right  Practiced at least 5 reps with cues for best technique   Right Sidelying to Sit 6: Modified independent (Device/Increase time)   Supine to Sit 6: Modified independent (Device/Increase time)   Sit to Supine 6: Modified independent (Device/Increase time)  Extra time; repeated 5 reps with cues for momentum/technique   Transfers   Transfers Sit to Stand;Stand to Sit   Sit to Stand 6: Modified independent (Device/Increase time)   Stand to Sit 6: Modified independent (Device/Increase time)   Number of Reps 10 reps;Other sets (comment)  4 sets; varied surfaces   Transfer Cueing Cues for proper transfer technique; cues for increased forward lean, momentum to stand   Comments Round robin activity with varied height surfaces for sit<>stand, with side stepping and turning to sit, with focus on safe turn to sit technique with and without use of hands for controlled descent  Multiple reps                 PT Education - 07/17/15 1236    Education provided Yes   Education Details Transfer technique, bed mobility technique   Person(s) Educated Patient   Methods Explanation;Demonstration;Verbal cues;Handout   Comprehension  Returned demonstration;Verbalized understanding          PT Short Term Goals - 12/31/14 1025    PT SHORT TERM GOAL #1   Title Pt will be independent with HEP for improved transfers, balance, and gait. (Target 12/30/14)   Status Partially Met   PT SHORT TERM GOAL #2   Title Pt will improve 5x sit<>stand to less than or equal to 11.5 seconds for improved transfer efficiency and safety.   Status Achieved   PT SHORT TERM GOAL #3   Title Pt will improve Functional Gait Assessment score to at least 18/30 for decreased fall risk.   Status Achieved   PT SHORT TERM GOAL #4   Title Pt will improve TUG score to less than or equal to 13.5 seconds for decreased fall risk.   Status Achieved   PT SHORT TERM GOAL #5   Title Pt will verbalize understanding of local  Parkinson's-disease related resources.   Status Achieved           PT Long Term Goals - 07/17/15 1243    PT LONG TERM GOAL #1   Title Pt will be independent with progressive HEP for improved functional mobility and gait.  TARGET 07/25/15  EXTEND GOAL CHECK DATE by 2 weeks, as weeks remain in POC-UPDATED TARGET:  08/08/15   Time 4   Period Weeks   Status New   PT LONG TERM GOAL #2   Title Pt will improve TUG score to less than or equal to 13.5 seconds for decreased fall risk.   Time 4   Period Weeks   Status New   PT LONG TERM GOAL #3   Title Pt will improve TUG cognitive score to less than or equal to 15 seconds for decreased fall risk and improved ability to multi-task.   Time 4   Period Weeks   Status New   PT LONG TERM GOAL #4   Title Pt will improve Functional Gait Assessment to at least 20/30 for decreased fall risk.   Time 4   Period Weeks   Status New   PT LONG TERM GOAL #5   Title Pt will improve 5x sit<>stand to less than or equal to 15 seconds for improved efficiency and safety with transfers.   Time 4   Period Weeks   Status New   PT LONG TERM GOAL #6   Title Pt will verbalize plans for continued community fitness upon D/C from PT.   Time 4   Period Weeks   Status New               Plan - 07/17/15 1237    Clinical Impression Statement Pt responds well to cueing for transfer and bed mobility technique.  He does need increased cues for improved use of momentum and large amplitude movements.  Pt able to verbalize transfer and bed mobility "take home" points at end of session.  Pt will continue to benefit from further skilled PT to address blance, gait and functional mobility.  Pt is progressing towards LTGs; due to patient's absence from therapy from death in family, extend goal check date by 2 weeks as visits/weeks remain in [place].   Pt will benefit from skilled therapeutic intervention in order to improve on the following deficits Abnormal gait;Decreased  balance;Decreased mobility;Decreased coordination;Decreased strength;Difficulty walking;Postural dysfunction   Rehab Potential Good   PT Frequency 2x / week   PT Duration 4 weeks  plus eval   PT Treatment/Interventions ADLs/Self Care Home Management;Therapeutic  exercise;Therapeutic activities;Functional mobility training;Gait training;Balance training;Neuromuscular re-education;Patient/family education   PT Next Visit Plan Review/follow-up on  transfers-varied heights and turning to sit safely;  weightshifting activities and education on strategies to reduce freezing   Consulted and Agree with Plan of Care Patient        Problem List Patient Active Problem List   Diagnosis Date Noted  . MDD (major depressive disorder), recurrent episode, severe ([place]) 02/10/2014  . Nonspecific abnormal electrocardiogram (ECG) (EKG) 02/07/2014  . Non-compliant behavior 02/03/2014  . Morton's neuroma of left foot 08/07/2013  . 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 Major depressive disorder, recurrent episode, severe ([place]) 03/13/2012  . Hyperglycemia 01/26/2012  . Hypogonadism male 01/24/2012  . Syncope and collapse 01/20/2012  .  OSA (obstructive sleep apnea) 01/17/2011  . CHEST TIGHTNESS 02/18/2008    Keaghan Bowens W. 07/17/2015, 12:45 PM Frazier Butt., PT [place] [address] [place], [place], [address] Phone: [phone]   Fax:  [phone]  Name: PURNELL DAIGLE MRN: [id number] Date of Birth: 02/24/1951

## 2015-07-17 NOTE — Therapy (Signed)
[facility] [address] [place], [place], [address] Phone: [phone]   Fax:  [phone]  Speech Language Pathology Treatment  Patient Details  Name: [name] [name] MRN: [id number] Date of Birth: Nov 19, 1950 No Data Recorded  Encounter Date: 07/17/2015      End of Session - 07/17/15 1147    Visit Number 3   Number of Visits 16   Date for SLP Re-Evaluation 08/25/15   Authorization Type 60 remain   Authorization - Visit Number 2   Authorization - Number of Visits 18   SLP Start Time 8413   SLP Stop Time  1147   SLP Time Calculation (min) 42 min   Activity Tolerance Patient tolerated treatment well      Past Medical History  Diagnosis Date  . Headache(784.0)   . Cardiac murmur     as a child  . Streptococcal meningitis     as an infant  . Depression   .  OSA (obstructive sleep apnea) 01/17/2011    npsg 2012:  AHI 67/hr. Auto titration 2012:  Optimal pressure 12cm.   Marland Kitchen HEADACHES, HX OF 02/18/2008    Qualifier: Diagnosis of  By: Danny Lawless CMA, [place]    . APPENDECTOMY, HX OF 02/18/2008    Qualifier: Diagnosis of  By: Danny Lawless CMA, [place]    . Parkinson disease 11/2014    Past Surgical History  Procedure Laterality Date  . Nasal sinus surgery      x 4 as a child  . Vasectomy    . Appendectomy  1967    There were no vitals filed for this visit.  Visit Diagnosis: Hypokinetic Parkinsonian dysphonia      Subjective Assessment - 07/17/15 1118    Subjective Pt more animated in facial musculature today - more smiling and raising eyebrows.               ADULT SLP TREATMENT - 07/17/15 1121    General Information   Behavior/Cognition Alert;Cooperative;Pleasant mood   Treatment Provided   Treatment provided Cognitive-Linquistic   Pain Assessment   Pain Assessment No/denies pain   Cognitive-Linquistic Treatment   Treatment focused on Dysarthria   Skilled Treatment SLP reviewed facial exercises  with pt and encouraged doing them twice a day for about 5 minutes. Mod complex conversation completed with 70dB average volume 90% of the time, over 20 minutes. In the last 5 minutes pt's loudness average reduced to 68dB. Noted pt's harshness in voice. SLP attempted loud "Hey!" and harshness subsided aprox 65% of the time. With incr'd abdominal push, harshness was not notably different.    Assessment / Recommendations / Plan   Plan Continue with current plan of care   Progression Toward Goals   Progression toward goals Progressing toward goals            SLP Short Term Goals - 07/17/15 1155    SLP SHORT TERM GOAL #1   Title Pt will produce loud /a/ average 88dB over 4 sessions   Time 4   Period Weeks   Status On-going   SLP SHORT TERM GOAL #2   Title Pt will demonstrate average of 70dB for in 10 minutes of mod complex conversaiton outside therapy room over two sessions   Status Achieved   SLP SHORT TERM GOAL #3   Title pt will demo knowledge of s/s aspiration with modified independence   Time 4   Period Weeks   Status On-going  SLP Long Term Goals - 07/17/15 1155    SLP LONG TERM GOAL #1   Title pt to maintain average 70dB in 15 minutes mod complex conversation outside [place] room over three sessions   Period Weeks  (or 16 visits)   Status Achieved   SLP LONG TERM GOAL #2   Title Pt will maintain an average of 70dBfor modcomplex conversation over 20 minutes with rare minimal assistance over 3 sessions (01/29/15)   Time 8   Period Weeks   Status New          Plan - 07/17/15 1150    Clinical Impression Statement Pt's speech today in 25 minute conversation remained at or above 70dB for approx 20 minutes. Skilled St remains necessary to cont to carryover WNL loudness to speaking situations outside ST room.   Speech Therapy Frequency 2x / week   Duration --  8 weeks   Treatment/Interventions SLP instruction and feedback;Internal/external aids;Patient/family  education;Functional tasks;Compensatory strategies   Potential to Achieve Goals Good   Potential Considerations Severity of impairments        Problem List Patient Active Problem List   Diagnosis Date Noted  . MDD (major depressive disorder), recurrent episode, severe ([place]) 02/10/2014  . Nonspecific abnormal electrocardiogram (ECG) (EKG) 02/07/2014  . Non-compliant behavior 02/03/2014  . Morton's neuroma of left foot 08/07/2013  . 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 Major depressive disorder, recurrent episode, severe ([place]) 03/13/2012  . Hyperglycemia 01/26/2012  . Hypogonadism male 01/24/2012  . Syncope and collapse 01/20/2012  .  OSA (obstructive sleep apnea) 01/17/2011  . CHEST TIGHTNESS 02/18/2008    SCHINKE,CARL , MS, CCC-SLP  07/17/2015, 12:51 PM  [facility] [address] [place], [place], [address] Phone: [phone]   Fax:  [phone]   Name: [name] [name] MRN: [id number] Date of Birth: 06-Apr-1951

## 2015-07-20 ENCOUNTER — Ambulatory Visit: Payer: Federal, State, Local not specified - PPO | Admitting: Physical Therapy

## 2015-07-20 ENCOUNTER — Ambulatory Visit: Payer: Federal, State, Local not specified - PPO | Admitting: Occupational Therapy

## 2015-07-20 ENCOUNTER — Encounter: Payer: Self-pay | Admitting: Occupational Therapy

## 2015-07-20 ENCOUNTER — Ambulatory Visit: Payer: Federal, State, Local not specified - PPO

## 2015-07-20 DIAGNOSIS — G20 Parkinson's disease: Secondary | ICD-10-CM

## 2015-07-20 DIAGNOSIS — R29898 Other symptoms and signs involving the musculoskeletal system: Secondary | ICD-10-CM

## 2015-07-20 DIAGNOSIS — R258 Other abnormal involuntary movements: Secondary | ICD-10-CM

## 2015-07-20 DIAGNOSIS — R4189 Other symptoms and signs involving cognitive functions and awareness: Secondary | ICD-10-CM

## 2015-07-20 DIAGNOSIS — Z7409 Other reduced mobility: Secondary | ICD-10-CM

## 2015-07-20 DIAGNOSIS — R293 Abnormal posture: Secondary | ICD-10-CM

## 2015-07-20 DIAGNOSIS — R490 Dysphonia: Secondary | ICD-10-CM

## 2015-07-20 DIAGNOSIS — R2681 Unsteadiness on feet: Secondary | ICD-10-CM

## 2015-07-20 DIAGNOSIS — R278 Other lack of coordination: Secondary | ICD-10-CM

## 2015-07-20 DIAGNOSIS — R279 Unspecified lack of coordination: Secondary | ICD-10-CM

## 2015-07-20 NOTE — Therapy (Signed)
[facility] [address] [place], [place], [address] Phone: [phone]   Fax:  [phone]  Occupational Therapy Treatment  Patient Details  Name: [name] [name] MRN: [id number] Date of Birth: 07-31-1951 No Data Recorded  Encounter Date: 07/20/2015      OT End of Session - 07/20/15 1011    Visit Number 5   Number of Visits 17   Date for OT Re-Evaluation 08/25/15   Authorization Type BCBS 75 visit limit combined, no auth, **? 25 visits remaining combined   OT Start Time 0933   OT Stop Time 1015   OT Time Calculation (min) 42 min   Activity Tolerance Patient tolerated treatment well   Behavior During Therapy [facility] for tasks assessed/performed      Past Medical History  Diagnosis Date  . Headache(784.0)   . Cardiac murmur     as a child  . Streptococcal meningitis     as an infant  . Depression   .  OSA (obstructive sleep apnea) 01/17/2011    npsg 2012:  AHI 67/hr. Auto titration 2012:  Optimal pressure 12cm.   Marland Kitchen HEADACHES, HX OF 02/18/2008    Qualifier: Diagnosis of  By: Danny Lawless CMA, [place]    . APPENDECTOMY, HX OF 02/18/2008    Qualifier: Diagnosis of  By: Danny Lawless CMA, [place]    . Parkinson disease ([place]) 11/2014    Past Surgical History  Procedure Laterality Date  . Nasal sinus surgery      x 4 as a child  . Vasectomy    . Appendectomy  1967    There were no vitals filed for this visit.  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  Pt reports that he would like to keep working until at least 2018.  Pt agreeable to referral to [employer].  Pt reports that he has been using seated bike  x29min a day at home but has not been  performing [employer] HEP.   Pertinent History dx PD 11/2014, hx of depression, mild cognitive impairment per recent neuropsych cognitive testing   Patient Stated Goals pt reports incr tremors, slower walking, more freezing   Currently in Pain? No/denies                      OT Treatments/Exercises (OP) - 07/20/15 0001    ADLs   Work Long discussion regarding work and recommendation for [employer] services they provide to support pt in continuing to work.  Pt agreed to referral.  Pt given contact info and referral faxed to [facility].   Neurological Re-education Exercises   Reciprocal Movements Arm bike x40min level 3 for conditioning with min v.c. for speed.  Pt maintained 40-52rpms without rest.           [facility] - 07/20/15 1012    PWR! exercises Moves in supine   Comments x20 each with min verbal/demo cues for sequence/big movements in supine               OT Short Term Goals - 07/16/15 0949    OT SHORT TERM GOAL #1   Title Pt will be independent with updated HEP and appropriate community resources  prn--STGs due 07/25/15   Time 4   Period Weeks   Status On-going   OT SHORT TERM GOAL #2   Title Pt will improve coordination for ADLs as shown by improving time on 9-hole peg test by at least 8sec with LUE.   Baseline L-51.34sec   Time 4   Period Weeks   Status On-going   OT SHORT TERM GOAL #3   Title Pt will report incr ease with car transfers and bed mobility using stategies.   Time 4   Period Weeks   Status On-going   OT SHORT TERM GOAL #4   Title Pt will complete dressing fasteners mod I using strategies/AE prn.--due 12/30/14   Baseline pt reports difficulty tying shoes and sometimes buttons   Time 4   Period Weeks   Status On-going   OT SHORT TERM GOAL #5   Title Pt will verbalize understanding of cognitive compensation strategies/ways to promote cognitive skills prn.   Baseline pt reports incr difficulty with divided attention and memory    Time 4   Period Weeks   Status On-going           OT Long Term Goals - 06/25/15 2151    OT LONG TERM GOAL #1   Title Pt will verbalize understanding of updated AE/strategies for ADLs/IADLs prn.--check LTGs 08/24/15   Time 8   Period Weeks   Status New   OT LONG TERM GOAL #2   Title Pt will improve coordination for ADLs as shown by improving time on 9-hole peg test by at least 15sec with LUE.   Baseline R-30.54sec, L-51.34sec   Time 8   Status New   OT LONG TERM GOAL #3   Title Pt will improve coordination/functional reaching as shown by improving score on box and blocks test by at least 5 with BUE   Baseline R-49 blocks, L-48 blocks   Time 8   Period Weeks   Status New   OT LONG TERM GOAL #4   Title Pt will improve balance/functional reaching for ADLs as shown by improving standing functional reach test to at least 11inches bilaterally.   Baseline 9" bilaterally   Time 8   Period Weeks   Status New   OT LONG TERM GOAL #5   Title Pt will improve dressing ability as shown by improving time on PPT#4 by at least 5sec.--due 01/29/15   Baseline 21.53sec   Time 8   Period Weeks   Status New   OT LONG TERM GOAL #6   Title -------------------------               Problem List Patient Active Problem List   Diagnosis Date Noted  . MDD (major depressive disorder), recurrent episode, severe ([place]) 02/10/2014  . Nonspecific abnormal electrocardiogram (ECG) (EKG) 02/07/2014  . Non-compliant behavior 02/03/2014  . Morton's neuroma of left foot 08/07/2013  . 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 Major depressive disorder, recurrent episode, severe ([place]) 03/13/2012  . Hyperglycemia 01/26/2012  . Hypogonadism male 01/24/2012  . Syncope and collapse 01/20/2012  .  OSA (obstructive sleep apnea) 01/17/2011  . CHEST TIGHTNESS 02/18/2008    [facility] 07/20/2015, 12:58 PM  [facility] [address] [place], [place], [address] Phone: [phone]   Fax:  [phone]  Name: NIVAAN DICENZO MRN: [id number] Date of Birth: Oct 02, 1951  Vianne Bulls, OTR/L 07/20/2015 12:59 PM

## 2015-07-20 NOTE — Therapy (Signed)
[facility] [address] [place], [place], [address] Phone: [phone]   Fax:  [phone]  Speech Language Pathology Treatment  Patient Details  Name: [name] [name] MRN: [id number] Date of Birth: [birth year] No Data Recorded  Encounter Date: 07/20/2015      End of Session - 07/20/15 1104    Visit Number 4   Number of Visits 16   Date for SLP Re-Evaluation 08/25/15   Authorization Type 8   Authorization - Visit Number 3   Authorization - Number of Visits 18   SLP Start Time 2725   SLP Stop Time  1100   SLP Time Calculation (min) 42 min   Activity Tolerance Patient tolerated treatment well      Past Medical History  Diagnosis Date  . Headache(784.0)   . Cardiac murmur     as a child  . Streptococcal meningitis     as an infant  . Depression   .  OSA (obstructive sleep apnea) 01/17/2011    npsg 2012:  AHI 67/hr. Auto titration 2012:  Optimal pressure 12cm.   Marland Kitchen HEADACHES, HX OF 02/18/2008    Qualifier: Diagnosis of  By: Danny Lawless CMA, [place]    . APPENDECTOMY, HX OF 02/18/2008    Qualifier: Diagnosis of  By: Danny Lawless CMA, [place]    . Parkinson disease ([place]) 11/2014    Past Surgical History  Procedure Laterality Date  . Nasal sinus surgery      x 4 as a child  . Vasectomy    . Appendectomy  1967    There were no vitals filed for this visit.  Visit Diagnosis: Hypokinetic Parkinsonian dysphonia      Subjective Assessment - 07/20/15 1028    Subjective Pt has been practicing his HEP for facial musculature over the weekend. "My wife didn't tell me I was mumbling as much this weekend."               ADULT SLP TREATMENT - 07/20/15 1029    General Information   Behavior/Cognition Alert;Cooperative;Pleasant mood   Treatment Provided   Treatment provided Cognitive-Linquistic   Pain Assessment   Pain Assessment No/denies pain   Cognitive-Linquistic Treatment   Treatment focused on Dysarthria    Skilled Treatment In order to recalibrate the pt's speech loudness loud /a/ was practiced and produced with average 84dB with SLP cues to maintain speaking pitch - pt's pitch raised with each subsequent loud /a/ rep. In multisentence tasks pt maintained 70dB 95% of the time. In conversation outside the building, pt maintained volume understood by SLP in both louder and more quiet environments.    Assessment / Recommendations / Plan   Plan Continue with current plan of care   Progression Toward Goals   Progression toward goals Progressing toward goals            SLP Short Term Goals - 07/20/15 1106    SLP SHORT TERM GOAL #1   Title Pt will produce loud /a/ average 88dB over 4 sessions   Time 4   Period Weeks   Status On-going   SLP SHORT TERM GOAL #2   Title Pt will demonstrate average of 70dB for in 10 minutes of mod complex conversaiton outside therapy room over two sessions   Status Achieved   SLP SHORT TERM GOAL #3   Title pt will demo knowledge of s/s aspiration with modified independence   Time 4   Period Weeks   Status On-going  SLP Long Term Goals - 07/20/15 1106    SLP LONG TERM GOAL #1   Title pt to maintain average 70dB in 15 minutes mod complex conversation outside [place] room over three sessions   Period Weeks  (or 16 visits)   Status Achieved   SLP LONG TERM GOAL #2   Title Pt will maintain an average of 70dBfor modcomplex conversation over 20 minutes with rare minimal assistance over 3 sessions (01/29/15)   Time 8   Period Weeks   Status New          Plan - 07/20/15 1105    Clinical Impression Statement Pt's speech today in 15 minute conversation in and outside [place] room remained at or above 70dB 90% of the time. Skilled St remains necessary to cont to carryover WNL loudness to speaking situations outside ST room.   Speech Therapy Frequency 2x / week   Duration --  7 weeks   Treatment/Interventions SLP instruction and feedback;Internal/external  aids;Patient/family education;Functional tasks;Compensatory strategies   Potential to Achieve Goals Good        Problem List Patient Active Problem List   Diagnosis Date Noted  . MDD (major depressive disorder), recurrent episode, severe ([place]) 02/10/2014  . Nonspecific abnormal electrocardiogram (ECG) (EKG) 02/07/2014  . Non-compliant behavior 02/03/2014  . Morton's neuroma of left foot 08/07/2013  . 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 Major depressive disorder, recurrent episode, severe ([place]) 03/13/2012  . Hyperglycemia 01/26/2012  . Hypogonadism male 01/24/2012  . Syncope and collapse 01/20/2012  .  OSA (obstructive sleep apnea) 01/17/2011  . CHEST TIGHTNESS 02/18/2008    Chasidy Janak , MS, CCC-SLP  07/20/2015, 11:07 AM  [facility] [address] [place], [place], [address] Phone: [phone]   Fax:  [phone]   Name: [name] [name] MRN: [id number] Date of Birth: 06-19-1951

## 2015-07-20 NOTE — Therapy (Signed)
[facility] [address] [place], [place], [address] Phone: [phone]   Fax:  [phone]  Physical Therapy Treatment  Patient Details  Name: [name] [name] MRN: [id number] Date of Birth: 02-10-1951 No Data Recorded  Encounter Date: 07/20/2015      PT End of Session - 07/20/15 0944    Visit Number 5   Number of Visits 9   Date for PT Re-Evaluation 08/24/15   Authorization Type BCBS 75 visit limit combined (56 remain per eval note-PT to check with insurance coordinator)-17 were used by PT previously; 50 ttoal used this year; avoid going over 25 visits this bout of therapy-07/15/15 AWM per email with KG   PT Start Time 0852   PT Stop Time 0932   PT Time Calculation (min) 40 min   Activity Tolerance Patient tolerated treatment well   Behavior During Therapy [facility] for tasks assessed/performed      Past Medical History  Diagnosis Date  . Headache(784.0)   . Cardiac murmur     as a child  . Streptococcal meningitis     as an infant  . Depression   .  OSA (obstructive sleep apnea) 01/17/2011    npsg 2012:  AHI 67/hr. Auto titration 2012:  Optimal pressure 12cm.   Marland Kitchen HEADACHES, HX OF 02/18/2008    Qualifier: Diagnosis of  By: Danny Lawless CMA, [place]    . APPENDECTOMY, HX OF 02/18/2008    Qualifier: Diagnosis of  By: Danny Lawless CMA, [place]    . Parkinson disease 11/2014    Past Surgical History  Procedure Laterality Date  . Nasal sinus surgery      x 4 as a child  . Vasectomy    . Appendectomy  1967    There were no vitals filed for this visit.  Visit Diagnosis:  Bradykinesia  Unsteadiness      Subjective Assessment - 07/20/15 0938    Subjective Planning to go back to work tomorrow.  Denies changes.  Reports using his recumbent bike at home every day x 15 minutes.  Improved affect today.   Patient Stated Goals Pt's goal for therapy is to continue to get stronger and move better.   Currently in Pain?  No/denies      Therapeutic Exercise-Scifit x 9 minutes at level 3.5 all 4 extremities with rpm>80 with bouts of 100rpm  Neuro-stepping over hurdles forward/backward with intermittent UE support, weight shifting to targets placed on floor, river stones, cone taps, cone double taps and cone tipping/uprighting.  Rocker board both directions work balance reactions, weight shifting and ankle strategies.        PT Education - 07/20/15 [phone]    Education provided Yes   Education Details purpose of weight shifting activities   Person(s) Educated Patient   Methods Explanation;Demonstration   Comprehension Verbalized understanding          PT Short Term Goals - 12/31/14 1025    PT SHORT TERM GOAL #1   Title Pt will be independent with HEP for improved transfers, balance, and gait. (Target 12/30/14)   Status Partially Met   PT SHORT TERM GOAL #2   Title Pt will improve 5x sit<>stand to less than or equal to 11.5 seconds for improved transfer efficiency and safety.   Status Achieved   PT SHORT TERM GOAL #3   Title Pt will improve Functional Gait Assessment score to at least 18/30 for decreased fall risk.   Status Achieved   PT SHORT TERM GOAL #  4   Title Pt will improve TUG score to less than or equal to 13.5 seconds for decreased fall risk.   Status Achieved   PT SHORT TERM GOAL #5   Title Pt will verbalize understanding of local Parkinson's-disease related resources.   Status Achieved           PT Long Term Goals - 07/17/15 1243    PT LONG TERM GOAL #1   Title Pt will be independent with progressive HEP for improved functional mobility and gait.  TARGET 07/25/15  EXTEND GOAL CHECK DATE by 2 weeks, as weeks remain in POC-UPDATED TARGET:  08/08/15   Time 4   Period Weeks   Status New   PT LONG TERM GOAL #2   Title Pt will improve TUG score to less than or equal to 13.5 seconds for decreased fall risk.   Time 4   Period Weeks   Status New   PT LONG TERM GOAL #3   Title Pt will  improve TUG cognitive score to less than or equal to 15 seconds for decreased fall risk and improved ability to multi-task.   Time 4   Period Weeks   Status New   PT LONG TERM GOAL #4   Title Pt will improve Functional Gait Assessment to at least 20/30 for decreased fall risk.   Time 4   Period Weeks   Status New   PT LONG TERM GOAL #5   Title Pt will improve 5x sit<>stand to less than or equal to 15 seconds for improved efficiency and safety with transfers.   Time 4   Period Weeks   Status New   PT LONG TERM GOAL #6   Title Pt will verbalize plans for continued community fitness upon D/C from PT.   Time 4   Period Weeks   Status New               Plan - 07/20/15 0944    Clinical Impression Statement Pt with occassional LOB with weight shifting for SLS activities today but recovered without assist.  Continue PT per POC.   Pt will benefit from skilled therapeutic intervention in order to improve on the following deficits Abnormal gait;Decreased balance;Decreased mobility;Decreased coordination;Decreased strength;Difficulty walking;Postural dysfunction   Rehab Potential Good   PT Frequency 2x / week   PT Duration 4 weeks  plus eval   PT Treatment/Interventions ADLs/Self Care Home Management;Therapeutic exercise;Therapeutic activities;Functional mobility training;Gait training;Balance training;Neuromuscular re-education;Patient/family education   PT Next Visit Plan Follow up on any issues with returning to work on 07/21/15.  Education on strategies to reduce freezing.   PT Home Exercise Plan PWR! Moves, transfers, walking program   Consulted and Agree with Plan of Care Patient        Problem List Patient Active Problem List   Diagnosis Date Noted  . MDD (major depressive disorder), recurrent episode, severe ([place]) 02/10/2014  . Nonspecific abnormal electrocardiogram (ECG) (EKG) 02/07/2014  . Non-compliant behavior 02/03/2014  . Morton's neuroma of left foot 08/07/2013   . 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 Major depressive disorder, recurrent episode, severe ([place]) 03/13/2012  . Hyperglycemia 01/26/2012  . Hypogonadism male 01/24/2012  . Syncope and collapse 01/20/2012  .  OSA (obstructive sleep apnea) 01/17/2011  . CHEST TIGHTNESS 02/18/2008    Narda Bonds 07/20/2015, 9:47 AM  [facility] [address] [place], [place], [address] Phone: [phone]   Fax:  [phone]  Name:  [name] [name] MRN: [id number] Date of Birth: 12-25-1950    [place], [place] 07/20/2015 9:47 AM Phone: [phone] Fax: [phone]

## 2015-07-22 ENCOUNTER — Ambulatory Visit: Payer: Federal, State, Local not specified - PPO

## 2015-07-22 ENCOUNTER — Ambulatory Visit: Payer: Federal, State, Local not specified - PPO | Admitting: Physical Therapy

## 2015-07-22 ENCOUNTER — Ambulatory Visit: Payer: Federal, State, Local not specified - PPO | Admitting: Occupational Therapy

## 2015-07-22 VITALS — BP 110/65

## 2015-07-22 DIAGNOSIS — R1310 Dysphagia, unspecified: Secondary | ICD-10-CM

## 2015-07-22 DIAGNOSIS — R278 Other lack of coordination: Secondary | ICD-10-CM

## 2015-07-22 DIAGNOSIS — R279 Unspecified lack of coordination: Secondary | ICD-10-CM

## 2015-07-22 DIAGNOSIS — R258 Other abnormal involuntary movements: Secondary | ICD-10-CM

## 2015-07-22 DIAGNOSIS — R4189 Other symptoms and signs involving cognitive functions and awareness: Secondary | ICD-10-CM

## 2015-07-22 DIAGNOSIS — R269 Unspecified abnormalities of gait and mobility: Secondary | ICD-10-CM

## 2015-07-22 DIAGNOSIS — R490 Dysphonia: Secondary | ICD-10-CM

## 2015-07-22 DIAGNOSIS — R29898 Other symptoms and signs involving the musculoskeletal system: Secondary | ICD-10-CM

## 2015-07-22 NOTE — Patient Instructions (Signed)
Tips to reduce freezing episodes with standing or walking:  1. Stand tall with your feet wide, so that you can rock and weight shift through your hips. 2. Don't try to fight the freeze: if you begin taking slower, faster, smaller steps, STOP, get your posture tall, and RESET your posture and balance.  Take a deep breath before taking the BIG step to start again. 3. March in place, with high knee stepping, to get started walking again. 4. Use auditory cues:  Count out loud, think of a familiar tune or song or cadence, use pocket metronome, to use rhythm to get started walking again. 5. Use visual cues:  Use a line to step over, use laser pointer line to step over, (using BIG steps) to start walking again. 6. Use visual targets to keep your posture tall (look ahead and focus on an object or target at eye level). 7. As you approach where your destination with walking, count your steps out loud and/or focus on your target with your eyes until you are fully there. 8. Use appropriate assistive device, as advised by your physical therapist to assist with taking longer, consistent steps. 9.     

## 2015-07-22 NOTE — Therapy (Signed)
[facility] [address] [place], [place], [address] Phone: [phone]   Fax:  [phone]  Speech Language Pathology Treatment  Patient Details  Name: [name] [name] MRN: [id number] Date of Birth: 1951/09/13 No Data Recorded  Encounter Date: 07/22/2015      End of Session - 07/22/15 1130    Visit Number 5   Number of Visits 16   Date for SLP Re-Evaluation 08/25/15   Authorization Type 30   Authorization - Visit Number 4   Authorization - Number of Visits 18   SLP Start Time 1020   SLP Stop Time  1100   SLP Time Calculation (min) 40 min   Activity Tolerance Patient tolerated treatment well      Past Medical History  Diagnosis Date  . Headache(784.0)   . Cardiac murmur     as a child  . Streptococcal meningitis     as an infant  . Depression   .  OSA (obstructive sleep apnea) 01/17/2011    npsg 2012:  AHI 67/hr. Auto titration 2012:  Optimal pressure 12cm.   Marland Kitchen HEADACHES, HX OF 02/18/2008    Qualifier: Diagnosis of  By: Danny Lawless CMA, [place]    . APPENDECTOMY, HX OF 02/18/2008    Qualifier: Diagnosis of  By: Danny Lawless CMA, [place]    . Parkinson disease ([place]) 11/2014    Past Surgical History  Procedure Laterality Date  . Nasal sinus surgery      x 4 as a child  . Vasectomy    . Appendectomy  1967    There were no vitals filed for this visit.  Visit Diagnosis: Hypokinetic Parkinsonian dysphonia  Dysphagia      Subjective Assessment - 07/22/15 1038    Subjective Pt has been practicing his HEP for facial musculature over the weekend. "My wife didn't tell me I was mumbling as much this weekend."               ADULT SLP TREATMENT - 07/22/15 1038    General Information   Behavior/Cognition Alert;Cooperative;Pleasant mood   Treatment Provided   Treatment provided Cognitive-Linquistic   Pain Assessment   Pain Assessment 0-10   Pain Score 1    Pain Location legs   Pain Descriptors /  Indicators Sore   Pain Intervention(s) Monitored during session   Cognitive-Linquistic Treatment   Treatment focused on Dysarthria   Skilled Treatment (swallow tx):Pt had difficulty with oral propulsion of pills this AM. SLP suggested pt take sips H2O prior to taking meds, or a 1/2 teaspoon of coconut or olive oil prioro to presentation. SLP assessed water first technique with pseudo-meds (Tic Tacs/M&Ms). Pt best success came with 2 smaller "pills" at a time, or one larger "pill" at a time. SLP suggested pt administer meds one large at a time or 2 smaller at a a time with a water wash first. (speech tx): Pt's conversation with SLP prior to starting formal therapy tasks pt's loudness was 69dB. Pt reports difficulty with initiation getting to work, then describing it as "the longest 8 hours of the day." SLP targeted increased loudness of conversation after working with pt's dysphagia with pills by loud /a/ and pt produced average 90dB. In multisentence tasks pt maintained average loudness of 70dB, and in conversation around clinic pt produced speech able to be heard 100% of the time in min noisy environment.    Assessment / Recommendations / Plan   Plan Continue with current plan of care  Progression Toward Goals   Progression toward goals Progressing toward goals          SLP Education - 07/22/15 1130    Education provided Yes   Education Details pill dysphagia compensations   Person(s) Educated Patient   Methods Explanation;Demonstration   Comprehension Verbalized understanding;Returned demonstration          SLP Short Term Goals - 07/22/15 1132    SLP SHORT TERM GOAL #1   Title Pt will produce loud /a/ average 88dB over 4 sessions   Time 3   Period Weeks   Status On-going   SLP SHORT TERM GOAL #2   Title Pt will demonstrate average of 70dB for in 10 minutes of mod complex conversaiton outside therapy room over two sessions   Status Achieved   SLP [place] #3   Title pt  will demo knowledge of s/s aspiration with modified independence   Time 3   Period Weeks   Status On-going   SLP SHORT TERM GOAL #4   Title pt will tell SLP two compensations for pill dysphagia   Time 3   Period Weeks   Status New          SLP Long Term Goals - 07/22/15 1133    SLP LONG TERM GOAL #1   Title pt to maintain average 70dB in 15 minutes mod complex conversation outside [place] room over three sessions   Period Weeks  (or 16 visits)   Status Achieved   SLP LONG TERM GOAL #2   Title Pt will maintain an average of 70dBfor modcomplex conversation over 20 minutes with rare minimal assistance over 3 sessions (01/29/15)   Time 8   Period Weeks   Status New          Plan - 07/22/15 1131    Clinical Impression Statement Pt now has some compensations for pill administration. Speech in min noisy environment for 7 minutes was understood by SLP 100%. Skilled ST remains necessary to get consistency with carryover of WNL loudness across more speaking situations. SLP added short term goal for pill dysphagia compensations.   Speech Therapy Frequency 2x / week   Duration --  7 weeks   Treatment/Interventions SLP instruction and feedback;Internal/external aids;Patient/family education;Functional tasks;Compensatory strategies;Aspiration precaution training   Potential to Achieve Goals Good   Potential Considerations Severity of impairments        Problem List Patient Active Problem List   Diagnosis Date Noted  . MDD (major depressive disorder), recurrent episode, severe ([place]) 02/10/2014  . Nonspecific abnormal electrocardiogram (ECG) (EKG) 02/07/2014  . Non-compliant behavior 02/03/2014  . Morton's neuroma of left foot 08/07/2013  . 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 Major depressive disorder, recurrent episode, severe ([place]) 03/13/2012  . Hyperglycemia 01/26/2012  . Hypogonadism male 01/24/2012  . Syncope and collapse 01/20/2012  .  OSA  (obstructive sleep apnea) 01/17/2011  . CHEST TIGHTNESS 02/18/2008    Pamula Luther , MS, CCC-SLP  07/22/2015, 11:34 AM  [facility] [address] [place], [place], [address] Phone: [phone]   Fax:  [phone]   Name: KAZ AULD MRN: [id number] Date of Birth: [birth year]

## 2015-07-22 NOTE — Patient Instructions (Signed)
Try the pills one at a time if large, or two at a time if small, with water wash first. Or try 1/2 teaspoon of olive or coconut oil prior to pill administration.  Tell me if you are still having difficulty swallowing pills, or begin to have difficulty with food or drinks.

## 2015-07-22 NOTE — Therapy (Signed)
[facility] [address] [place], [place], [address] Phone: [phone]   Fax:  [phone]  Occupational Therapy Treatment  Patient Details  Name: [name] [name] MRN: [id number] Date of Birth: Sep 01, 1951 No Data Recorded  Encounter Date: 07/22/2015      OT End of Session - 07/22/15 0952    Visit Number 6   Number of Visits 17   Date for OT Re-Evaluation 08/25/15   Authorization Type BCBS 75 visit limit combined, no auth, **25 visits remaining combined   OT Start Time 0848   OT Stop Time 0930   OT Time Calculation (min) 42 min   Activity Tolerance Patient tolerated treatment well   Behavior During Therapy [facility] for tasks assessed/performed      Past Medical History  Diagnosis Date  . Headache(784.0)   . Cardiac murmur     as a child  . Streptococcal meningitis     as an infant  . Depression   .  OSA (obstructive sleep apnea) 01/17/2011    npsg 2012:  AHI 67/hr. Auto titration 2012:  Optimal pressure 12cm.   Marland Kitchen HEADACHES, HX OF 02/18/2008    Qualifier: Diagnosis of  By: Danny Lawless CMA, [place]    . APPENDECTOMY, HX OF 02/18/2008    Qualifier: Diagnosis of  By: Danny Lawless CMA, [place]    . Parkinson disease ([place]) 11/2014    Past Surgical History  Procedure Laterality Date  . Nasal sinus surgery      x 4 as a child  . Vasectomy    . Appendectomy  1967    There were no vitals filed for this visit.  Visit Diagnosis:  Bradykinesia  Rigidity  Decreased coordination  Cognitive deficits      Subjective Assessment - 07/22/15 0859    Subjective  Pt reports work was hard yesterday   Pertinent History dx PD 11/2014, hx of depression, mild cognitive impairment per recent neuropsych cognitive testing   Patient Stated Goals pt reports incr tremors, slower walking, more freezing   Pain Score 2    Pain Location Generalized   Pain Descriptors / Indicators Sore   Pain Type Acute pain   Pain Onset In the past 7  days   Pain Frequency Intermittent   Aggravating Factors  overwork   Pain Relieving Factors rest         Treatment: PWR! Basic 4 in quadraped 10 reps each(PWR! Step modified with chair back) min -mod v.c. Arm bike x 6 mins level 1 for conditioning. Theraputic activities: copying small peg design with LUE for fine motor coordination, with cognitive component, increased time required. Discussion with pt regarding benefits of Voc Rehab. Pt has appointment yet is unsure if he is going to attend.                     OT Short Term Goals - 07/16/15 0949    OT SHORT TERM GOAL #1   Title Pt will be independent with updated HEP and appropriate community resources prn--STGs due 07/25/15   Time 4   Period Weeks   Status On-going   OT SHORT TERM GOAL #2   Title Pt will improve coordination for ADLs as shown by improving time on 9-hole peg test by at least 8sec with LUE.   Baseline L-51.34sec   Time 4   Period Weeks   Status On-going   OT SHORT TERM GOAL #3   Title Pt will report incr ease with car  transfers and bed mobility using stategies.   Time 4   Period Weeks   Status On-going   OT SHORT TERM GOAL #4   Title Pt will complete dressing fasteners mod I using strategies/AE prn.--due 12/30/14   Baseline pt reports difficulty tying shoes and sometimes buttons   Time 4   Period Weeks   Status On-going   OT SHORT TERM GOAL #5   Title Pt will verbalize understanding of cognitive compensation strategies/ways to promote cognitive skills prn.   Baseline pt reports incr difficulty with divided attention and memory   Time 4   Period Weeks   Status On-going           OT Long Term Goals - 06/25/15 2151    OT LONG TERM GOAL #1   Title Pt will verbalize understanding of updated AE/strategies for ADLs/IADLs prn.--check LTGs 08/24/15   Time 8   Period Weeks   Status New   OT LONG TERM GOAL #2   Title Pt will improve coordination for ADLs as shown by improving time on  9-hole peg test by at least 15sec with LUE.   Baseline R-30.54sec, L-51.34sec   Time 8   Status New   OT LONG TERM GOAL #3   Title Pt will improve coordination/functional reaching as shown by improving score on box and blocks test by at least 5 with BUE   Baseline R-49 blocks, L-48 blocks   Time 8   Period Weeks   Status New   OT LONG TERM GOAL #4   Title Pt will improve balance/functional reaching for ADLs as shown by improving standing functional reach test to at least 11inches bilaterally.   Baseline 9" bilaterally   Time 8   Period Weeks   Status New   OT LONG TERM GOAL #5   Title Pt will improve dressing ability as shown by improving time on PPT#4 by at least 5sec.--due 01/29/15   Baseline 21.53sec   Time 8   Period Weeks   Status New   OT LONG TERM GOAL #6   Title -------------------------               Plan - 07/22/15 0930    Clinical Impression Statement Pt is progressing towards goals. He demonstrates understanding of PWR! exercises in quadraped.    Pt will benefit from skilled therapeutic intervention in order to improve on the following deficits (Retired) Decreased coordination;Decreased endurance;Decreased activity tolerance;Impaired tone;Impaired UE functional use;Decreased knowledge of use of DME;Decreased balance;Decreased cognition;Decreased mobility   Rehab Potential Good   OT Frequency 2x / week   OT Duration 8 weeks   Plan reinforce PWR! quadraped   OT Home Exercise Plan issued 07/15/15:memory compensations/ keeping thinking skills sharp, reviewed supine PWR!, PWR! Hands! Issued quadraped PWR! 07/22/15   Consulted and Agree with Plan of Care Patient        Problem List Patient Active Problem List   Diagnosis Date Noted  . MDD (major depressive disorder), recurrent episode, severe ([place]) 02/10/2014  . Nonspecific abnormal electrocardiogram (ECG) (EKG) 02/07/2014  . Non-compliant behavior 02/03/2014  . Morton's neuroma of left foot 08/07/2013  .  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 Major depressive disorder, recurrent episode, severe ([place]) 03/13/2012  . Hyperglycemia 01/26/2012  . Hypogonadism male 01/24/2012  . Syncope and collapse 01/20/2012  .  OSA (obstructive sleep apnea) 01/17/2011  . CHEST TIGHTNESS 02/18/2008    Brinnley Lacap 07/22/2015, 9:54 AM Theone Murdoch, OTR/L [phone] Phone: [phone]  9:54 AM 07/22/2015 [facility] [address] [place], [place], [address] Phone: [phone]   Fax:  [phone]  Name: [name] [name] MRN: [id number] Date of Birth: November 13, 1950

## 2015-07-23 NOTE — Therapy (Signed)
[facility] [address] [place], [place], [address] Phone: [phone]   Fax:  [phone]  Physical Therapy Treatment  Patient Details  Name: [name] [name] MRN: [id number] Date of Birth: Feb 10, 1951 No Data Recorded  Encounter Date: 07/22/2015      PT End of Session - 07/23/15 1257    Visit Number 6   Number of Visits 9   Date for PT Re-Evaluation 08/24/15   Authorization Type BCBS 75 visit limit combined (56 remain per eval note-PT to check with insurance coordinator)-17 were used by PT previously; 50 ttoal used this year; avoid going over 25 visits this bout of therapy-07/15/15 AWM per email with KG   PT Start Time [phone]   PT Stop Time 1016   PT Time Calculation (min) 40 min   Activity Tolerance Patient tolerated treatment well   Behavior During Therapy [facility] for tasks assessed/performed      Past Medical History  Diagnosis Date  . Headache(784.0)   . Cardiac murmur     as a child  . Streptococcal meningitis     as an infant  . Depression   .  OSA (obstructive sleep apnea) 01/17/2011    npsg 2012:  AHI 67/hr. Auto titration 2012:  Optimal pressure 12cm.   Marland Kitchen HEADACHES, HX OF 02/18/2008    Qualifier: Diagnosis of  By: Danny Lawless CMA, [place]    . APPENDECTOMY, HX OF 02/18/2008    Qualifier: Diagnosis of  By: Danny Lawless CMA, [place]    . Parkinson disease ([place]) 11/2014    Past Surgical History  Procedure Laterality Date  . Nasal sinus surgery      x 4 as a child  . Vasectomy    . Appendectomy  1967    Filed Vitals:   07/22/15 0939  BP: 110/65    Visit Diagnosis:  Bradykinesia  Abnormality of gait      Subjective Assessment - 07/22/15 0939    Subjective Went back to work yesterday.  Sore from working and I have headache (which is unusual for me).     Currently in Pain? Yes   Pain Score 2    Pain Location --  back, head, legs, shoulders   Pain Descriptors / Indicators Sore;Aching   Pain Type  Acute pain   Pain Onset Yesterday   Aggravating Factors  work activities   Pain Relieving Factors Tylenol                         [facility] Adult PT Treatment/Exercise - 07/23/15 0001    Transfers   Sit to Stand 6: Modified independent (Device/Increase time)   Stand to Sit 6: Modified independent (Device/Increase time)   Number of Reps 10 reps  cues for intensity of movement   Comments Squat sit<>stand x 10 reps, for functional lower extremity strengthening in squat position   Self-Care   Self-Care Other Self-Care Comments   Other Self-Care Comments  Tips to reduce freezing episodes of gait; practiced weightshifting lateral and stagger stance forward; discussed PWR! Up posture to stop freezing episodes; marching in place/marching turns, visualizing targets   Therapeutic Activites    Therapeutic Activities Work Simulation   Work Simulation Practiced squatting to simulate picking up objects from floor, as at work; 15 reps with cues for proper squat technique with [employer]! Up to stand; practiced sidestep squats to varied targets to pick up items from floor, simulating work   Exercises   Exercises Knee/Hip;Ankle  Lumbar Exercises: Stretches   Active Hamstring Stretch 3 reps;30 seconds  seated   Ankle Exercises: Stretches   Gastroc Stretch 3 reps;30 seconds  each:  standing at 2" block; runner's stretch                PT Education - 07/23/15 1241    Education provided Yes   Education Details tips to reduce freezing with gait; lifting/squatting technique   Person(s) Educated Patient   Methods Explanation;Demonstration;Handout   Comprehension Verbalized understanding;Returned demonstration;Tactile cues required          PT Short Term Goals - 12/31/14 1025    PT SHORT TERM GOAL #1   Title Pt will be independent with HEP for improved transfers, balance, and gait. (Target 12/30/14)   Status Partially Met   PT SHORT TERM GOAL #2   Title Pt will improve 5x  sit<>stand to less than or equal to 11.5 seconds for improved transfer efficiency and safety.   Status Achieved   PT SHORT TERM GOAL #3   Title Pt will improve Functional Gait Assessment score to at least 18/30 for decreased fall risk.   Status Achieved   PT SHORT TERM GOAL #4   Title Pt will improve TUG score to less than or equal to 13.5 seconds for decreased fall risk.   Status Achieved   PT SHORT TERM GOAL #5   Title Pt will verbalize understanding of local Parkinson's-disease related resources.   Status Achieved           PT Long Term Goals - 07/17/15 1243    PT LONG TERM GOAL #1   Title Pt will be independent with progressive HEP for improved functional mobility and gait.  TARGET 07/25/15  EXTEND GOAL CHECK DATE by 2 weeks, as weeks remain in POC-UPDATED TARGET:  08/08/15   Time 4   Period Weeks   Status New   PT LONG TERM GOAL #2   Title Pt will improve TUG score to less than or equal to 13.5 seconds for decreased fall risk.   Time 4   Period Weeks   Status New   PT LONG TERM GOAL #3   Title Pt will improve TUG cognitive score to less than or equal to 15 seconds for decreased fall risk and improved ability to multi-task.   Time 4   Period Weeks   Status New   PT LONG TERM GOAL #4   Title Pt will improve Functional Gait Assessment to at least 20/30 for decreased fall risk.   Time 4   Period Weeks   Status New   PT LONG TERM GOAL #5   Title Pt will improve 5x sit<>stand to less than or equal to 15 seconds for improved efficiency and safety with transfers.   Time 4   Period Weeks   Status New   PT LONG TERM GOAL #6   Title Pt will verbalize plans for continued community fitness upon D/C from PT.   Time 4   Period Weeks   Status New               Plan - 07/23/15 1258    Clinical Impression Statement With cues, pt able to more effectively squat to pick up objects from floor, simulating work tasks.  Discussed and practiced varied methods of reduce freezing  episodes with gait.  Pt will benefit from further skilled PT to further address balance, gait, transfers, and strength.   Pt will benefit from skilled therapeutic intervention in  order to improve on the following deficits Abnormal gait;Decreased balance;Decreased mobility;Decreased coordination;Decreased strength;Difficulty walking;Postural dysfunction   Rehab Potential Good   PT Frequency 2x / week   PT Duration 4 weeks  plus eval   PT Treatment/Interventions ADLs/Self Care Home Management;Therapeutic exercise;Therapeutic activities;Functional mobility training;Gait training;Balance training;Neuromuscular re-education;Patient/family education   PT Next Visit Plan Practice squats, functional strengthening activities-progress towards goals   PT Home Exercise Plan PWR! Moves, transfers, walking program   Consulted and Agree with Plan of Care Patient        Problem List Patient Active Problem List   Diagnosis Date Noted  . MDD (major depressive disorder), recurrent episode, severe ([place]) 02/10/2014  . Nonspecific abnormal electrocardiogram (ECG) (EKG) 02/07/2014  . Non-compliant behavior 02/03/2014  . Morton's neuroma of left foot 08/07/2013  . 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 Major depressive disorder, recurrent episode, severe ([place]) 03/13/2012  . Hyperglycemia 01/26/2012  . Hypogonadism male 01/24/2012  . Syncope and collapse 01/20/2012  .  OSA (obstructive sleep apnea) 01/17/2011  . CHEST TIGHTNESS 02/18/2008    Armya Westerhoff W. 07/23/2015, 1:06 PM Frazier Butt., PT [facility] [address] [place], [place], [address] Phone: [phone]   Fax:  [phone]  Name: [name] [name] MRN: [id number] Date of Birth: 25-Apr-1951

## 2015-07-28 ENCOUNTER — Ambulatory Visit: Payer: Federal, State, Local not specified - PPO

## 2015-07-28 ENCOUNTER — Encounter: Payer: Self-pay | Admitting: Occupational Therapy

## 2015-07-28 ENCOUNTER — Ambulatory Visit: Payer: Federal, State, Local not specified - PPO | Admitting: Occupational Therapy

## 2015-07-28 ENCOUNTER — Ambulatory Visit: Payer: Federal, State, Local not specified - PPO | Admitting: Physical Therapy

## 2015-07-28 DIAGNOSIS — Z7409 Other reduced mobility: Secondary | ICD-10-CM

## 2015-07-28 DIAGNOSIS — G20 Parkinson's disease: Secondary | ICD-10-CM

## 2015-07-28 DIAGNOSIS — R1310 Dysphagia, unspecified: Secondary | ICD-10-CM

## 2015-07-28 DIAGNOSIS — R278 Other lack of coordination: Secondary | ICD-10-CM

## 2015-07-28 DIAGNOSIS — R490 Dysphonia: Secondary | ICD-10-CM

## 2015-07-28 DIAGNOSIS — R258 Other abnormal involuntary movements: Secondary | ICD-10-CM

## 2015-07-28 DIAGNOSIS — R4701 Aphasia: Secondary | ICD-10-CM

## 2015-07-28 DIAGNOSIS — R279 Unspecified lack of coordination: Secondary | ICD-10-CM

## 2015-07-28 DIAGNOSIS — R29898 Other symptoms and signs involving the musculoskeletal system: Secondary | ICD-10-CM

## 2015-07-28 DIAGNOSIS — R293 Abnormal posture: Secondary | ICD-10-CM

## 2015-07-28 DIAGNOSIS — R4189 Other symptoms and signs involving cognitive functions and awareness: Secondary | ICD-10-CM

## 2015-07-28 NOTE — Therapy (Signed)
[facility] [address] [place], [place], [address] Phone: [phone]   Fax:  [phone]  Occupational Therapy Treatment  Patient Details  Name: [name] [name] MRN: [id number] Date of Birth: [birth year] No Data Recorded  Encounter Date: 07/28/2015      OT End of Session - 07/28/15 0901    Visit Number 7   Number of Visits 17   Date for OT Re-Evaluation 08/25/15   Authorization Type BCBS 75 visit limit combined, no auth, **25 visits remaining combined   Authorization - Visit Number 7   Authorization - Number of Visits 8   OT Start Time [id number]   OT Stop Time 0930   OT Time Calculation (min) 39 min   Activity Tolerance Patient tolerated treatment well   Behavior During Therapy [facility] for tasks assessed/performed      Past Medical History  Diagnosis Date  . Headache(784.0)   . Cardiac murmur     as a child  . Streptococcal meningitis     as an infant  . Depression   .  OSA (obstructive sleep apnea) 01/17/2011    npsg 2012:  AHI 67/hr. Auto titration 2012:  Optimal pressure 12cm.   Marland Kitchen HEADACHES, HX OF 02/18/2008    Qualifier: Diagnosis of  By: Genelle Gather CMA, [place]    . APPENDECTOMY, HX OF 02/18/2008    Qualifier: Diagnosis of  By: Genelle Gather CMA, [place]    . Parkinson disease ([facility]) 11/2014    Past Surgical History  Procedure Laterality Date  . Nasal sinus surgery      x 4 as a child  . Vasectomy    . Appendectomy  1967    There were no vitals filed for this visit.  Visit Diagnosis:  Bradykinesia  Rigidity  Decreased coordination  Cognitive deficits  Posture abnormality  Decreased functional mobility and endurance      Subjective Assessment - 07/28/15 0851    Subjective  Pt reports feeling a little lightheaded and the "start" of a headache at beginning of session, but later reports that he "feels ok"   Pertinent History dx PD 11/2014, hx of depression, mild cognitive impairment per recent  neuropsych cognitive testing   Patient Stated Goals pt reports incr tremors, slower walking, more freezing   Currently in Pain? No/denies                      OT Treatments/Exercises (OP) - 07/28/15 0001    ADLs   Functional Mobility Discussed use of big movement strategies for car transfers.  Pt verbalized understanding.   ADL Comments began checking goals and discussing progress in prep for anticipated d/c next visit.  (See goals section).  Pt educated in recommendation for follow up evaluation in again in approx 6-20months.  Pt verbalized understanding.           PWR [facility]) - 07/28/15 1050    PWR! exercises Moves in [place]! Up 10   PWR! Rock 10   PWR! Twist 10 to each side   PWR! Step 10 to each side   Comments in quadraped with min v.c.             OT Education - 07/28/15 1052    Education Details Emphasized importance of performing HEP at home (as pt reports using bike but no other exercise); Recommended at least 1 position of PWR! Moves/coordination HEP per day in addition to cardio   Person(s) Educated Patient  Methods Explanation   Comprehension Verbalized understanding          OT Short Term Goals - 07/28/15 0904    OT SHORT TERM GOAL #1   Title Pt will be independent with updated HEP and appropriate community resources prn--STGs due 07/25/15   Time 4   Period Weeks   Status On-going   OT SHORT TERM GOAL #2   Title Pt will improve coordination for ADLs as shown by improving time on 9-hole peg test by at least 8sec with LUE.   Baseline L-51.34sec   Time 4   Period Weeks   Status Achieved  07/28/15:  30.31sec   OT SHORT TERM GOAL #3   Title Pt will report incr ease with car transfers and bed mobility using stategies.   Time 4   Period Weeks   Status Partially Met  Met  07/28/15:  met for bed mobility, not fully met with all car transfers   OT SHORT TERM GOAL #4   Title Pt will complete dressing fasteners mod I using  strategies/AE prn.--due 12/30/14   Baseline pt reports difficulty tying shoes and sometimes buttons   Time 4   Period Weeks   Status Achieved  07/28/15   OT SHORT TERM GOAL #5   Title Pt will verbalize understanding of cognitive compensation strategies/ways to promote cognitive skills prn.   Baseline pt reports incr difficulty with divided attention and memory   Time 4   Period Weeks   Status On-going           OT Long Term Goals - 07/28/15 8469    OT LONG TERM GOAL #1   Title Pt will verbalize understanding of updated AE/strategies for ADLs/IADLs prn.--check LTGs 08/24/15   Time 8   Period Weeks   Status New   OT LONG TERM GOAL #2   Title Pt will improve coordination for ADLs as shown by improving time on 9-hole peg test by at least 15sec with LUE.   Baseline R-30.54sec, L-51.34sec   Time 8   Status Achieved  07/28/15  30.31sec   OT LONG TERM GOAL #3   Title Pt will improve coordination/functional reaching as shown by improving score on box and blocks test by at least 5 with BUE   Baseline R-49 blocks, L-48 blocks   Time 8   Period Weeks   Status New   OT LONG TERM GOAL #4   Title Pt will improve balance/functional reaching for ADLs as shown by improving standing functional reach test to at least 11inches bilaterally.   Baseline 9" bilaterally   Time 8   Period Weeks   Status Achieved  07/28/15:  R-13", L-12inches   OT LONG TERM GOAL #5   Title Pt will improve dressing ability as shown by improving time on PPT#4 by at least 5sec.--due 01/29/15   Baseline 21.53sec   Time 8   Period Weeks   Status New   OT LONG TERM GOAL #6   Title -------------------------               Plan - 07/28/15 1054    Plan review coordination HEP, check remaining goals and anticipate d/c, issue HEP flowsheet   OT Home Exercise Plan issued 07/15/15:memory compensations/ keeping thinking skills sharp, reviewed supine PWR!, PWR! Hands!   Consulted and Agree with Plan of Care Patient         Problem List Patient Active Problem List   Diagnosis Date Noted  . MDD (major depressive disorder),  recurrent episode, severe ([place]) 02/10/2014  . Nonspecific abnormal electrocardiogram (ECG) (EKG) 02/07/2014  . Non-compliant behavior 02/03/2014  . Morton's neuroma of left foot 08/07/2013  . 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 Major depressive disorder, recurrent episode, severe ([place]) 03/13/2012  . Hyperglycemia 01/26/2012  . Hypogonadism male 01/24/2012  . Syncope and collapse 01/20/2012  .  OSA (obstructive sleep apnea) 01/17/2011  . CHEST TIGHTNESS 02/18/2008    [facility] 07/28/2015, 11:00 AM  [facility] [address] [place], [place], [address] Phone: [phone]   Fax:  [phone]  Name: JOANDRY SLAGTER MRN: [id number] Date of Birth: 01-03-1951  Vianne Bulls, OTR/L 07/28/2015 11:00 AM

## 2015-07-28 NOTE — Therapy (Signed)
[facility] [address] [place], [place], [address] Phone: [phone]   Fax:  [phone]  Speech Language Pathology Treatment  Patient Details  Name: [name] [name] MRN: [id number] Date of Birth: [birth year] No Data Recorded  Encounter Date: 07/28/2015      End of Session - 07/28/15 1012    Visit Number 6   Number of Visits 16   Date for SLP Re-Evaluation 08/25/15   Authorization - Visit Number 5   Authorization - Number of Visits 18   SLP Start Time 0933   SLP Stop Time  6629   SLP Time Calculation (min) 42 min   Activity Tolerance Patient tolerated treatment well      Past Medical History  Diagnosis Date  . Headache(784.0)   . Cardiac murmur     as a child  . Streptococcal meningitis     as an infant  . Depression   .  OSA (obstructive sleep apnea) 01/17/2011    npsg 2012:  AHI 67/hr. Auto titration 2012:  Optimal pressure 12cm.   Marland Kitchen HEADACHES, HX OF 02/18/2008    Qualifier: Diagnosis of  By: Danny Lawless CMA, [place]    . APPENDECTOMY, HX OF 02/18/2008    Qualifier: Diagnosis of  By: Danny Lawless CMA, [place]    . Parkinson disease ([place]) 11/2014    Past Surgical History  Procedure Laterality Date  . Nasal sinus surgery      x 4 as a child  . Vasectomy    . Appendectomy  1967    There were no vitals filed for this visit.  Visit Diagnosis: Hypokinetic Parkinsonian dysphonia  Dysphagia  Expressive aphasia      Subjective Assessment - 07/28/15 0939    Subjective Pt reports pills have been easier to take since last session.                ADULT SLP TREATMENT - 07/28/15 0934    General Information   Behavior/Cognition Alert;Cooperative;Pleasant mood   Treatment Provided   Treatment provided Cognitive-Linquistic   Pain Assessment   Pain Assessment No/denies pain   Cognitive-Linquistic Treatment   Treatment focused on Dysarthria;Aphasia   Skilled Treatment Pt reports getting choked  rarely on saliva. SLP suggested lozenge or gum to cue pt to swallow. Pt's conversation prior to loud /a/ was average 69dB. Loud /a/ with initial cues for abdominal push average 96dB. In simple and mod complex conversation pt maintained 70dB with rare min A over 19 minutes. Discussion wiht pt re: aphasia he is experiencing presently.. Pt would like to cont with tx to address aphasia, after speech goals are met. He told pt two ways to combat pill dysphagia.   Assessment / Recommendations / Plan   Plan Continue with current plan of care   Progression Toward Goals   Progression toward goals Progressing toward goals          SLP Education - 07/28/15 1210    Education provided Yes   Education Details Aphasia in Parkinson's disease   Person(s) Educated Patient   Methods Explanation   Comprehension Verbalized understanding          SLP Short Term Goals - 07/28/15 1016    SLP SHORT TERM GOAL #1   Title Pt will produce loud /a/ average 88dB over 4 sessions   Time 2   Period Weeks   Status On-going   SLP SHORT TERM GOAL #2   Title Pt will demonstrate average of 70dB for in  10 minutes of mod complex conversaiton outside therapy room over two sessions   Status Achieved   SLP SHORT TERM GOAL #3   Title pt will demo knowledge of s/s aspiration with modified independence   Time 2   Period Weeks   Status On-going   SLP SHORT TERM GOAL #4   Title pt will tell SLP two compensations for pill dysphagia   Time --   Period --   Status Achieved   SLP SHORT TERM GOAL #5   Title pt will tell SLP three ways to compensate for aphasia   Time 2   Period Weeks   Status New          SLP Long Term Goals - 07/28/15 1016    SLP LONG TERM GOAL #1   Title pt to maintain average 70dB in 15 minutes mod complex conversation outside [place] room over three sessions   Period Weeks  (or 16 visits)   Status Achieved   SLP LONG TERM GOAL #2   Title Pt will maintain an average of 70dBfor modcomplex  conversation over 20 minutes with rare minimal assistance over 3 sessions (01/29/15)   Time 6   Period Weeks   Status New   SLP LONG TERM GOAL #3   Title Pt will demo compensations for verbal expression in 10 minutes conversation   Time 6   Period Weeks   Status New          Plan - 07/28/15 1012    Clinical Impression Statement Pt would like to cont with ST for aphasia-like symptoms after loudness tx concludes. Skilled ST remains needed to finish up recalibration of speech loudness, and to cont with expressive language (verbal).   Speech Therapy Frequency 2x / week   Duration --  5 weeks   Treatment/Interventions SLP instruction and feedback;Internal/external aids;Patient/family education;Functional tasks;Compensatory strategies;Aspiration precaution training   Potential to Achieve Goals Good   Potential Considerations Severity of impairments        Problem List Patient Active Problem List   Diagnosis Date Noted  . MDD (major depressive disorder), recurrent episode, severe ([place]) 02/10/2014  . Nonspecific abnormal electrocardiogram (ECG) (EKG) 02/07/2014  . Non-compliant behavior 02/03/2014  . Morton's neuroma of left foot 08/07/2013  . 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 Major depressive disorder, recurrent episode, severe ([place]) 03/13/2012  . Hyperglycemia 01/26/2012  . Hypogonadism male 01/24/2012  . Syncope and collapse 01/20/2012  .  OSA (obstructive sleep apnea) 01/17/2011  . CHEST TIGHTNESS 02/18/2008    SCHINKE,CARL , MS, CCC-SLP  07/28/2015, 12:13 PM  [facility] [address] [place], [place], [address] Phone: [phone]   Fax:  [phone]   Name: [name] [name] MRN: [id number] Date of Birth: September 16, 1951

## 2015-07-30 ENCOUNTER — Ambulatory Visit: Payer: Federal, State, Local not specified - PPO

## 2015-07-30 ENCOUNTER — Ambulatory Visit: Payer: Federal, State, Local not specified - PPO | Admitting: Occupational Therapy

## 2015-07-30 ENCOUNTER — Ambulatory Visit: Payer: Federal, State, Local not specified - PPO | Admitting: Physical Therapy

## 2015-07-30 DIAGNOSIS — R278 Other lack of coordination: Secondary | ICD-10-CM

## 2015-07-30 DIAGNOSIS — R258 Other abnormal involuntary movements: Secondary | ICD-10-CM | POA: Diagnosis not present

## 2015-07-30 DIAGNOSIS — R4701 Aphasia: Secondary | ICD-10-CM

## 2015-07-30 DIAGNOSIS — R490 Dysphonia: Secondary | ICD-10-CM

## 2015-07-30 DIAGNOSIS — R29898 Other symptoms and signs involving the musculoskeletal system: Secondary | ICD-10-CM

## 2015-07-30 DIAGNOSIS — G20 Parkinson's disease: Secondary | ICD-10-CM

## 2015-07-30 DIAGNOSIS — R1310 Dysphagia, unspecified: Secondary | ICD-10-CM

## 2015-07-30 DIAGNOSIS — R293 Abnormal posture: Secondary | ICD-10-CM

## 2015-07-30 DIAGNOSIS — R279 Unspecified lack of coordination: Secondary | ICD-10-CM

## 2015-07-30 DIAGNOSIS — R4189 Other symptoms and signs involving cognitive functions and awareness: Secondary | ICD-10-CM

## 2015-07-30 DIAGNOSIS — R2681 Unsteadiness on feet: Secondary | ICD-10-CM

## 2015-07-30 DIAGNOSIS — R269 Unspecified abnormalities of gait and mobility: Secondary | ICD-10-CM

## 2015-07-30 NOTE — Patient Instructions (Addendum)
Performing Daily Activities with Big Movements  Pick at least one activity a day and perform with BIG, DELIBERATE movements/effort. This can make the activity easier and turn daily activities into exercise!  If you are standing during the activity, make sure to keep feet apart and stand with good/big/PWR! UP posture.  Examples:  Dressing - Push arms in sleeves, twist when putting on jacket, push foot into pants, open hands to pull down shirt/put on socks/pull up pants  Bathing - Wash/dry with long strokes  Brushing your teeth - Big, slow movements  Cutting food - Long deliberate cuts  Opening jar/bottle - Move as much as you can with each turn  Picking up a cup/bottle - Open hand up big and get object all the way in palm  Hanging up clothes/getting clothes down from closet - Reach with big effort  Putting away groceries/dishes - Reach with big effort  Wiping counter/table - Move in big, long strokes  Stirring while cooking - Exaggerate movement  Cleaning windows - Move in big, long strokes  Sweeping - Move arms in big, long strokes  Vacuuming - Push with big movement  Folding clothes - Exaggerate arm movements  Washing car - Move in big, long strokes  Raking - Move arms in big, long strokes  Changing light bulb - Move as much as you can with each turn  Using a screwdriver - Move as much as you can with each turn  Walking into a store/restaurant - Walk with big steps, swing arms if able  Standing up from a chair/recliner/sofa - Scoot forward, lean forward, and stand with big effort                                 (Exercise) Monday Tuesday Wednesday Thursday Friday Saturday Sunday

## 2015-07-30 NOTE — Patient Instructions (Signed)
Slow your rate of speech down and that will help you say the right words.

## 2015-07-30 NOTE — Therapy (Signed)
[facility] [address] [place], [place], [address] Phone: [phone]   Fax:  [phone]  Physical Therapy Treatment  Patient Details  Name: [name] [name] MRN: [id number] Date of Birth: January 21, 1951 No Data Recorded  Encounter Date: 07/30/2015      PT End of Session - 07/30/15 1446    Visit Number 7   Number of Visits 9   Date for PT Re-Evaluation 08/24/15   Authorization Type BCBS 75 visit limit combined (56 remain per eval note-PT to check with insurance coordinator)-17 were used by PT previously; 50 ttoal used this year; avoid going over 25 visits this bout of therapy-07/15/15 AWM per email with KG   PT Start Time [phone]   PT Stop Time 1016   PT Time Calculation (min) 42 min   Activity Tolerance Patient tolerated treatment well   Behavior During Therapy [facility] for tasks assessed/performed      Past Medical History  Diagnosis Date  . Headache(784.0)   . Cardiac murmur     as a child  . Streptococcal meningitis     as an infant  . Depression   .  OSA (obstructive sleep apnea) 01/17/2011    npsg 2012:  AHI 67/hr. Auto titration 2012:  Optimal pressure 12cm.   Marland Kitchen HEADACHES, HX OF 02/18/2008    Qualifier: Diagnosis of  By: Danny Lawless CMA, [place]    . APPENDECTOMY, HX OF 02/18/2008    Qualifier: Diagnosis of  By: Danny Lawless CMA, [place]    . Parkinson disease ([place]) 11/2014    Past Surgical History  Procedure Laterality Date  . Nasal sinus surgery      x 4 as a child  . Vasectomy    . Appendectomy  1967    There were no vitals filed for this visit.  Visit Diagnosis:  Abnormality of gait  Bradykinesia      Subjective Assessment - 07/30/15 1445    Subjective Denies falls or changes.  Feels like he walks a lot during the day at work.   Patient Stated Goals Pt's goal for therapy is to continue to get stronger and move better.   Currently in Pain? No/denies       Reviewed and performed Sitting and Standing  PWR! Exercises with verbal cues for intensity and technique.  Provided handout for optimal community fitness and helped pt prioritize exercises.  Instructed pt to perform Walking Program 5x/week, Cardio 3x/week, PWR! Exercises daily (pick 2 from sitting, standing, all 4's and supine and alternate exercises each day)       PT Education - 07/30/15 1445    Education provided Yes   Education Details Sitting PWR!, Standing PWR!;community fitness plan   Person(s) Educated Patient   Methods Explanation;Demonstration;Verbal cues;Handout   Comprehension Verbalized understanding;Returned demonstration;Verbal cues required          PT Short Term Goals - 12/31/14 1025    PT SHORT TERM GOAL #1   Title Pt will be independent with HEP for improved transfers, balance, and gait. (Target 12/30/14)   Status Partially Met   PT SHORT TERM GOAL #2   Title Pt will improve 5x sit<>stand to less than or equal to 11.5 seconds for improved transfer efficiency and safety.   Status Achieved   PT SHORT TERM GOAL #3   Title Pt will improve Functional Gait Assessment score to at least 18/30 for decreased fall risk.   Status Achieved   PT SHORT TERM GOAL #4   Title Pt  will improve TUG score to less than or equal to 13.5 seconds for decreased fall risk.   Status Achieved   PT SHORT TERM GOAL #5   Title Pt will verbalize understanding of local Parkinson's-disease related resources.   Status Achieved           PT Long Term Goals - 07/17/15 1243    PT LONG TERM GOAL #1   Title Pt will be independent with progressive HEP for improved functional mobility and gait.  TARGET 07/25/15  EXTEND GOAL CHECK DATE by 2 weeks, as weeks remain in POC-UPDATED TARGET:  08/08/15   Time 4   Period Weeks   Status New   PT LONG TERM GOAL #2   Title Pt will improve TUG score to less than or equal to 13.5 seconds for decreased fall risk.   Time 4   Period Weeks   Status New   PT LONG TERM GOAL #3   Title Pt will improve TUG  cognitive score to less than or equal to 15 seconds for decreased fall risk and improved ability to multi-task.   Time 4   Period Weeks   Status New   PT LONG TERM GOAL #4   Title Pt will improve Functional Gait Assessment to at least 20/30 for decreased fall risk.   Time 4   Period Weeks   Status New   PT LONG TERM GOAL #5   Title Pt will improve 5x sit<>stand to less than or equal to 15 seconds for improved efficiency and safety with transfers.   Time 4   Period Weeks   Status New   PT LONG TERM GOAL #6   Title Pt will verbalize plans for continued community fitness upon D/C from PT.   Time 4   Period Weeks   Status New               Plan - 07/30/15 1447    Clinical Impression Statement Pt continues to need cues for intensity of movements.  Discussed optimal community fitness and helped pt prioritize HEP from OT and PT (with OT's input).  Continue PT per POC.   Pt will benefit from skilled therapeutic intervention in order to improve on the following deficits Abnormal gait;Decreased balance;Decreased mobility;Decreased coordination;Decreased strength;Difficulty walking;Postural dysfunction   Rehab Potential Good   PT Frequency 2x / week   PT Duration 4 weeks  plus eval   PT Treatment/Interventions ADLs/Self Care Home Management;Therapeutic exercise;Therapeutic activities;Functional mobility training;Gait training;Balance training;Neuromuscular re-education;Patient/family education   PT Next Visit Plan Practice squats, functional strengthening activities-progress towards goals   PT Home Exercise Plan PWR! Moves, transfers, walking program   Consulted and Agree with Plan of Care Patient        Problem List Patient Active Problem List   Diagnosis Date Noted  . MDD (major depressive disorder), recurrent episode, severe ([place]) 02/10/2014  . Nonspecific abnormal electrocardiogram (ECG) (EKG) 02/07/2014  . Non-compliant behavior 02/03/2014  . Morton's neuroma of left foot  08/07/2013  . 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 Major depressive disorder, recurrent episode, severe ([place]) 03/13/2012  . Hyperglycemia 01/26/2012  . Hypogonadism male 01/24/2012  . Syncope and collapse 01/20/2012  .  OSA (obstructive sleep apnea) 01/17/2011  . CHEST TIGHTNESS 02/18/2008    Narda Bonds 07/30/2015, 2:49 PM  [facility] [address] [place], [place], [address] Phone: [phone]   Fax:  [phone]  Name: [name] [name] MRN: [id number]  Date of Birth: 04-23-1951    [place], [place] 07/30/2015 2:49 PM Phone: [phone] Fax: [phone]

## 2015-07-30 NOTE — Therapy (Signed)
[facility] [address] [place], [place], [address] Phone: [phone]   Fax:  [phone]  Occupational Therapy Treatment  Patient Details  Name: [name] [name] MRN: [id number] Date of Birth: [birth year] No Data Recorded  Encounter Date: 07/30/2015      OT End of Session - 07/30/15 0854    Visit Number 8   Number of Visits 17   Date for OT Re-Evaluation 08/25/15   Authorization Type BCBS 75 visit limit combined, no auth, **25 visits remaining combined   Authorization - Visit Number 8   Authorization - Number of Visits 8   OT Start Time 0850   OT Stop Time 0930   OT Time Calculation (min) 40 min   Activity Tolerance Patient tolerated treatment well   Behavior During Therapy [facility] for tasks assessed/performed      Past Medical History  Diagnosis Date  . Headache(784.0)   . Cardiac murmur     as a child  . Streptococcal meningitis     as an infant  . Depression   .  OSA (obstructive sleep apnea) 01/17/2011    npsg 2012:  AHI 67/hr. Auto titration 2012:  Optimal pressure 12cm.   Marland Kitchen HEADACHES, HX OF 02/18/2008    Qualifier: Diagnosis of  By: Danny Lawless CMA, [place]    . APPENDECTOMY, HX OF 02/18/2008    Qualifier: Diagnosis of  By: Danny Lawless CMA, [place]    . Parkinson disease ([place]) 11/2014    Past Surgical History  Procedure Laterality Date  . Nasal sinus surgery      x 4 as a child  . Vasectomy    . Appendectomy  1967    There were no vitals filed for this visit.  Visit Diagnosis:  Bradykinesia  Rigidity  Posture abnormality  Decreased coordination  Cognitive deficits  Unsteadiness      Subjective Assessment - 07/30/15 0853    Subjective  "doing good"   Pertinent History dx PD 11/2014, hx of depression, mild cognitive impairment per recent neuropsych cognitive testing   Patient Stated Goals pt reports incr tremors, slower walking, more freezing   Currently in Pain? No/denies                       OT Treatments/Exercises (OP) - 07/30/15 0001    ADLs   Overall ADLs Pt instructed in use of flowsheet.  Recommended pt perform cardiovascular exercise for at least 83mn per day and perform at least 1 position of PWR! moves/coordination HEP per day.  Pt verbalized understanding.   UB Dressing Practiced donning/doffing jacket using big movment strategies.  Pt returned demo after initial instruction.   Functional Mobility Instructed pt in use of strategy to help prevent freezing.  Pt instructed to keep feet apart/staggered for wider base of support in sitting and when stopping and to use wt. shifts if he does freeze.  Pt verbalized understanding.   ADL Comments Checked remaining goals and discussed progress--see goals section.  Recommended pt return for re-eval in approx 6 months.  Pt verbalized understanding.  Pt instructed in use of big movement strategies for ADLs.  Pt verbalized understanding.                OT Education - 07/30/15 1055    Education Details big movements for ADLs; HEP flowsheet and how to incorporate various exercises into week   Person(s) Educated Patient   Methods Explanation;Demonstration;Verbal cues;Handout   Comprehension Verbalized understanding;Returned demonstration;Verbal cues  required          OT Short Term Goals - 07/30/15 0943    OT SHORT TERM GOAL #1   Title Pt will be independent with updated HEP and appropriate community resources prn--STGs due 07/25/15   Time 4   Period Weeks   Status Achieved  07/30/15:  (voc rehab, community exercise, POP)   OT SHORT TERM GOAL #2   Title Pt will improve coordination for ADLs as shown by improving time on 9-hole peg test by at least 8sec with LUE.   Baseline L-51.34sec   Time 4   Period Weeks   Status Achieved  07/28/15:  30.31sec   OT SHORT TERM GOAL #3   Title Pt will report incr ease with car transfers and bed mobility using stategies.   Time 4   Period Weeks    Status Partially Met  Met  07/28/15:  met for bed mobility, not fully met with all car transfers   OT SHORT TERM GOAL #4   Title Pt will complete dressing fasteners mod I using strategies/AE prn.--due 12/30/14   Baseline pt reports difficulty tying shoes and sometimes buttons   Time 4   Period Weeks   Status Achieved  07/28/15   OT SHORT TERM GOAL #5   Title Pt will verbalize understanding of cognitive compensation strategies/ways to promote cognitive skills prn.   Baseline pt reports incr difficulty with divided attention and memory   Time 4   Period Weeks   Status Achieved           OT Long Term Goals - 07/30/15 0902    OT LONG TERM GOAL #1   Title Pt will verbalize understanding of updated AE/strategies for ADLs/IADLs prn.--check LTGs 08/24/15   Time 8   Period Weeks   Status Achieved  07/30/15   OT LONG TERM GOAL #2   Title Pt will improve coordination for ADLs as shown by improving time on 9-hole peg test by at least 15sec with LUE.   Baseline R-30.54sec, L-51.34sec   Time 8   Status Achieved  07/28/15  30.31sec   OT LONG TERM GOAL #3   Title Pt will improve coordination/functional reaching as shown by improving score on box and blocks test by at least 5 with BUE   Baseline R-49 blocks, L-48 blocks   Time 8   Period Weeks   Status Not Met  07/30/15:  R-51, L-45blocks   OT LONG TERM GOAL #4   Title Pt will improve balance/functional reaching for ADLs as shown by improving standing functional reach test to at least 11inches bilaterally.   Baseline 9" bilaterally   Time 8   Period Weeks   Status Achieved  07/28/15:  R-13", L-12inches   OT LONG TERM GOAL #5   Title Pt will improve dressing ability as shown by improving time on PPT#4 by at least 5sec.--due 01/29/15   Baseline 21.53sec   Time 8   Period Weeks   Status Achieved  07/30/15:  12.38sec   OT LONG TERM GOAL #6   Title -------------------------               Plan - 07/30/15 6301    Clinical  Impression Statement Pt has made good progress with balance for ADLs and coordiantion.  Pt is appropriate for d/c from OT at this time.   Plan d/c OT, re-eval in approx 6 months    OT Home Exercise Plan issued 07/15/15:memory compensations/ keeping thinking skills sharp, reviewed supine  PWR!, PWR! Hands!, quadraped PWR! moves, 07/30/15 reviewed big movements for ADLs   Consulted and Agree with Plan of Care Patient       OCCUPATIONAL THERAPY DISCHARGE SUMMARY  Visits from Start of Care: 8  Current functional level related to goals / functional outcomes: See above   Remaining deficits: Bradykinesia, rigidity, decreased coordination, abnormal posture, decr balance for ADLs--all improved    Education / Equipment: Pt instructed in updated HEP, cognitive tips/activities to encourage cognition, Voc Rehabilitation services, strategies for ADLs.  Plan: Patient agrees to discharge.  Patient goals were partially met. Patient is being discharged due to                                                    reaching maximal rehab potential at this time.  Pt would benefit from re-evaluation/occupational therapy screen in approx 6 months to assess for need for further therapy/functional changes due to progressive nature of diagnosis.  ?????          Problem List Patient Active Problem List   Diagnosis Date Noted  . MDD (major depressive disorder), recurrent episode, severe ([place]) 02/10/2014  . Nonspecific abnormal electrocardiogram (ECG) (EKG) 02/07/2014  . Non-compliant behavior 02/03/2014  . Morton's neuroma of left foot 08/07/2013  . 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 Major depressive disorder, recurrent episode, severe ([place]) 03/13/2012  . Hyperglycemia 01/26/2012  . Hypogonadism male 01/24/2012  . Syncope and collapse 01/20/2012  .  OSA (obstructive sleep apnea) 01/17/2011  . CHEST TIGHTNESS 02/18/2008    [facility] 07/30/2015, 12:39  PM  [facility] [address] [place], [place], [address] Phone: [phone]   Fax:  [phone]  Name: [name] [name] MRN: [id number] Date of Birth: 03/12/1951  Vianne Bulls, OTR/L 07/30/2015 12:39 PM

## 2015-07-30 NOTE — Therapy (Signed)
[facility] [address] [place], [place], [address] Phone: [phone]   Fax:  [phone]  Speech Language Pathology Treatment  Patient Details  Name: [name] [name] MRN: [id number] Date of Birth: May 09, 1951 No Data Recorded  Encounter Date: 07/30/2015      End of Session - 07/30/15 0849    Visit Number 7   Number of Visits 16   Date for SLP Re-Evaluation 08/25/15   Authorization Type 37   Authorization - Visit Number 6   Authorization - Number of Visits 60   SLP Start Time 0803   SLP Stop Time  0845   SLP Time Calculation (min) 42 min   Activity Tolerance Patient tolerated treatment well      Past Medical History  Diagnosis Date  . Headache(784.0)   . Cardiac murmur     as a child  . Streptococcal meningitis     as an infant  . Depression   .  OSA (obstructive sleep apnea) 01/17/2011    npsg 2012:  AHI 67/hr. Auto titration 2012:  Optimal pressure 12cm.   Marland Kitchen HEADACHES, HX OF 02/18/2008    Qualifier: Diagnosis of  By: Danny Lawless CMA, [place]    . APPENDECTOMY, HX OF 02/18/2008    Qualifier: Diagnosis of  By: Danny Lawless CMA, [place]    . Parkinson disease ([place]) 11/2014    Past Surgical History  Procedure Laterality Date  . Nasal sinus surgery      x 4 as a child  . Vasectomy    . Appendectomy  1967    There were no vitals filed for this visit.  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 No/denies               ADULT SLP TREATMENT - 07/30/15 0808    General Information   Behavior/Cognition Alert;Cooperative;Pleasant mood   Treatment Provided   Treatment provided Cognitive-Linquistic   Cognitive-Linquistic Treatment   Treatment focused on Dysarthria;Aphasia   Skilled Treatment Pt bought hard candy to suck on during the day in order to decr episodes of choking on saliva, which has  "worked thus far", pt reported. Loud /a/ completed with average 94dB. Multi sentence responses at average 70dB. In walking around clinic today pt was intelligible 100% of the time. Pt with "acessible" instead of "acceptable" and reiterated to pt that this was an example of difficulty pt has with language. SLP stressed pt's compensatory measure of slowing his speech rate to provide more time for brain to sequence phonemes correctly.     Assessment / Recommendations / Plan   Plan Continue with current plan of care   Progression Toward Goals   Progression toward goals Progressing toward goals          SLP Education - 07/30/15 1703    Education provided Yes   Education Details compensations for dysnomia   Person(s) Educated Patient   Methods Explanation   Comprehension Verbalized understanding;Need further instruction;Verbal cues required          SLP Short Term Goals - 07/30/15 0850    SLP SHORT TERM GOAL #1   Title Pt will produce loud /a/ average 88dB over 4 sessions   Time 2   Period Weeks   Status On-going   SLP SHORT TERM GOAL #2   Title Pt will demonstrate average of 70dB for in 10 minutes of  mod complex conversaiton outside therapy room over two sessions   Status Achieved   SLP SHORT TERM GOAL #3   Title pt will demo knowledge of s/s aspiration with modified independence   Time 2   Period Weeks   Status On-going   SLP SHORT TERM GOAL #4   Title pt will tell SLP two compensations for pill dysphagia   Status Achieved   SLP SHORT TERM GOAL #5   Title pt will tell SLP three ways to compensate for aphasia   Time 2   Period Weeks   Status New          SLP Long Term Goals - 07/30/15 1705    SLP LONG TERM GOAL #1   Title pt to maintain average 70dB in 15 minutes mod complex conversation outside [place] room over three sessions   Period Weeks  (or 16 visits)   Status Achieved   SLP LONG TERM GOAL #2   Title Pt will maintain an average of 70dBfor modcomplex conversation  over 20 minutes with rare minimal assistance over 3 sessions    Time 6   Period Weeks   Status New   SLP LONG TERM GOAL #3   Title Pt will demo compensations for verbal expression in 10 minutes conversation   Time 6   Period Weeks   Status New          Plan - 07/30/15 0850    Speech Therapy Frequency 2x / week   Duration --  5 weeks   Treatment/Interventions SLP instruction and feedback;Internal/external aids;Patient/family education;Functional tasks;Compensatory strategies;Aspiration precaution training   Potential to Achieve Goals Good   Potential Considerations Severity of impairments        Problem List Patient Active Problem List   Diagnosis Date Noted  . MDD (major depressive disorder), recurrent episode, severe ([place]) 02/10/2014  . Nonspecific abnormal electrocardiogram (ECG) (EKG) 02/07/2014  . Non-compliant behavior 02/03/2014  . Morton's neuroma of left foot 08/07/2013  . 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 Major depressive disorder, recurrent episode, severe ([place]) 03/13/2012  . Hyperglycemia 01/26/2012  . Hypogonadism male 01/24/2012  . Syncope and collapse 01/20/2012  .  OSA (obstructive sleep apnea) 01/17/2011  . CHEST TIGHTNESS 02/18/2008    SCHINKE,CARL , MS, CCC-SLP  07/30/2015, 5:08 PM  [facility] [address] [place], [place], [address] Phone: [phone]   Fax:  [phone]   Name: [name] [name] MRN: [id number] Date of Birth: 03-11-1951

## 2015-08-04 ENCOUNTER — Encounter: Payer: Self-pay | Admitting: Occupational Therapy

## 2015-08-04 ENCOUNTER — Encounter: Payer: Self-pay | Admitting: Speech Pathology

## 2015-08-04 ENCOUNTER — Ambulatory Visit: Payer: Self-pay | Admitting: Physical Therapy

## 2015-08-06 ENCOUNTER — Ambulatory Visit: Payer: Self-pay | Admitting: Physical Therapy

## 2015-08-06 ENCOUNTER — Encounter: Payer: Self-pay | Admitting: Occupational Therapy

## 2015-08-10 ENCOUNTER — Encounter: Payer: Federal, State, Local not specified - PPO | Admitting: Occupational Therapy

## 2015-08-10 ENCOUNTER — Ambulatory Visit: Payer: Federal, State, Local not specified - PPO | Admitting: Physical Therapy

## 2015-08-10 ENCOUNTER — Ambulatory Visit: Payer: Federal, State, Local not specified - PPO | Attending: Neurology

## 2015-08-10 DIAGNOSIS — R269 Unspecified abnormalities of gait and mobility: Secondary | ICD-10-CM | POA: Diagnosis present

## 2015-08-10 DIAGNOSIS — G20 Parkinson's disease: Secondary | ICD-10-CM

## 2015-08-10 DIAGNOSIS — R1310 Dysphagia, unspecified: Secondary | ICD-10-CM | POA: Insufficient documentation

## 2015-08-10 DIAGNOSIS — R258 Other abnormal involuntary movements: Secondary | ICD-10-CM

## 2015-08-10 DIAGNOSIS — R490 Dysphonia: Secondary | ICD-10-CM | POA: Diagnosis present

## 2015-08-10 DIAGNOSIS — R4701 Aphasia: Secondary | ICD-10-CM | POA: Insufficient documentation

## 2015-08-10 NOTE — Patient Instructions (Signed)
Keep the loud "ah" practice at 5 reps, twice a day  Consider other family members driving on long trips.

## 2015-08-10 NOTE — Therapy (Signed)
[facility] [address] [place], [place], [address] Phone: [phone]   Fax:  [phone]  Physical Therapy Treatment  Patient Details  Name: [name] [name] MRN: [id number] Date of Birth: June 21, 1951 No Data Recorded  Encounter Date: 08/10/2015      PT End of Session - 08/10/15 2026    Visit Number 8   Number of Visits 9   Date for PT Re-Evaluation 08/24/15   Authorization Type [employer] 75 visit limit combined (56 remain per eval note-PT to check with insurance coordinator)-17 were used by PT previously; 50 ttoal used this year; avoid going over 25 visits this bout of therapy-07/15/15 AWM per email with KG   PT Start Time 0851   PT Stop Time 0929   PT Time Calculation (min) 38 min   Activity Tolerance Patient tolerated treatment well   Behavior During Therapy [facility] for tasks assessed/performed      Past Medical History  Diagnosis Date  . Headache(784.0)   . Cardiac murmur     as a child  . Streptococcal meningitis     as an infant  . Depression   .  OSA (obstructive sleep apnea) 01/17/2011    npsg 2012:  AHI 67/hr. Auto titration 2012:  Optimal pressure 12cm.   Marland Kitchen HEADACHES, HX OF 02/18/2008    Qualifier: Diagnosis of  By: Danny Lawless CMA, [place]    . APPENDECTOMY, HX OF 02/18/2008    Qualifier: Diagnosis of  By: Danny Lawless CMA, [place]    . Parkinson disease ([place]) 11/2014    Past Surgical History  Procedure Laterality Date  . Nasal sinus surgery      x 4 as a child  . Vasectomy    . Appendectomy  1967    There were no vitals filed for this visit.  Visit Diagnosis:  Abnormality of gait  Bradykinesia      Subjective Assessment - 08/10/15 0855    Subjective No falls, no changes since last visit.  Was out of town in [place] last week celebrating mother's birthday.   Currently in Pain? Yes   Pain Score 1    Pain Location Back   Pain Orientation Lower;Left   Pain Descriptors / Indicators Aching   Pain Type  Acute pain   Pain Onset Today   Pain Frequency Intermittent   Aggravating Factors  way I slept   Pain Relieving Factors moving through the day alleviate pain            [facility] PT Assessment - 08/10/15 0901    Timed Up and Go Test   Normal TUG (seconds) 12.49  14.21 sec, then 12.24 secon (avg = 12.98 sec)   Cognitive TUG (seconds) 14.5   Functional Gait  Assessment   Gait assessed  Yes   Gait Level Surface Walks 20 ft in less than 7 sec but greater than 5.5 sec, uses assistive device, slower speed, mild gait deviations, or deviates 6-10 in outside of the 12 in walkway width.  6.86 sec   Change in Gait Speed Able to change speed, demonstrates mild gait deviations, deviates 6-10 in outside of the 12 in walkway width, or no gait deviations, unable to achieve a major change in velocity, or uses a change in velocity, or uses an assistive device.   Gait with Horizontal Head Turns Performs head turns smoothly with no change in gait. Deviates no more than 6 in outside 12 in walkway width   Gait with Vertical Head Turns Performs  head turns with no change in gait. Deviates no more than 6 in outside 12 in walkway width.   Gait and Pivot Turn Pivot turns safely within 3 sec and stops quickly with no loss of balance.   Step Over Obstacle Is able to step over one shoe box (4.5 in total height) without changing gait speed. No evidence of imbalance.   Gait with Narrow Base of Support Ambulates 4-7 steps.   Gait with Eyes Closed Walks 20 ft, slow speed, abnormal gait pattern, evidence for imbalance, deviates 10-15 in outside 12 in walkway width. Requires more than 9 sec to ambulate 20 ft.  10.14 sec   Ambulating Backwards Walks 20 ft, uses assistive device, slower speed, mild gait deviations, deviates 6-10 in outside 12 in walkway width.  16.38   Steps Alternating feet, must use rail.   Total Score 21                     [facility] Adult PT Treatment/Exercise - 08/10/15 0901    Transfers    Transfers Sit to Stand;Stand to Sit   Sit to Stand 6: Modified independent (Device/Increase time);From elevated surface;Without upper extremity assist;From chair/3-in-1;From bed   Stand to Sit 6: Modified independent (Device/Increase time);To elevated surface;Without upper extremity assist;To chair/3-in-1   Number of Reps 10 reps;Other sets (comment)  from 22", 18", 16" surfaces, 10 reps each   Comments Squatting to pick up cones from floor x 10 reps for functional strengthening in squat position                PT Education - 08/10/15 2025    Education provided Yes   Education Details Importance of ongoing exercise   Person(s) Educated Patient   Methods Explanation   Comprehension Verbalized understanding          PT Short Term Goals - 12/31/14 1025    PT SHORT TERM GOAL #1   Title Pt will be independent with HEP for improved transfers, balance, and gait. (Target 12/30/14)   Status Partially Met   PT SHORT TERM GOAL #2   Title Pt will improve 5x sit<>stand to less than or equal to 11.5 seconds for improved transfer efficiency and safety.   Status Achieved   PT SHORT TERM GOAL #3   Title Pt will improve Functional Gait Assessment score to at least 18/30 for decreased fall risk.   Status Achieved   PT SHORT TERM GOAL #4   Title Pt will improve TUG score to less than or equal to 13.5 seconds for decreased fall risk.   Status Achieved   PT SHORT TERM GOAL #5   Title Pt will verbalize understanding of local Parkinson's-disease related resources.   Status Achieved           PT Long Term Goals - 08/10/15 0859    PT LONG TERM GOAL #1   Title Pt will be independent with progressive HEP for improved functional mobility and gait.  TARGET 07/25/15  EXTEND GOAL CHECK DATE by 2 weeks, as weeks remain in POC-UPDATED TARGET:  08/08/15   Time 4   Period Weeks   Status On-going   PT LONG TERM GOAL #2   Title Pt will improve TUG score to less than or equal to 13.5 seconds for  decreased fall risk.   Time 4   Period Weeks   Status Achieved   PT LONG TERM GOAL #3   Title Pt will improve TUG cognitive score to less than  or equal to 15 seconds for decreased fall risk and improved ability to multi-task.   Baseline 14.5 sec 08/10/15   Time 4   Period Weeks   Status Achieved   PT LONG TERM GOAL #4   Title Pt will improve Functional Gait Assessment to at least 20/30 for decreased fall risk.   Time 4   Period Weeks   Status Achieved   PT LONG TERM GOAL #5   Title Pt will improve 5x sit<>stand to less than or equal to 15 seconds for improved efficiency and safety with transfers.   Baseline 14.49 seconds 08/10/15   Time 4   Period Weeks   Status Achieved   PT LONG TERM GOAL #6   Title Pt will verbalize plans for continued community fitness upon D/C from PT.   Time 4   Period Weeks   Status On-going               Plan - 08/10/15 2028    Clinical Impression Statement Pt has met LTG #2, 3, 4, and 5.  LTG# 1 and 6 to be fully assessed next visit upon discharge.  Explained to patient improved functional gains with therapy and stressed benefits of continued exercise upon D/C.   Pt will benefit from skilled therapeutic intervention in order to improve on the following deficits Abnormal gait;Decreased balance;Decreased mobility;Decreased coordination;Decreased strength;Difficulty walking;Postural dysfunction   Rehab Potential Good   PT Frequency 2x / week   PT Duration 4 weeks  plus eval   PT Treatment/Interventions ADLs/Self Care Home Management;Therapeutic exercise;Therapeutic activities;Functional mobility training;Gait training;Balance training;Neuromuscular re-education;Patient/family education   PT Next Visit Plan Plan for discharge next visit-   PT Home Exercise Plan PWR! Moves, transfers, walking program   Consulted and Agree with Plan of Care Patient        Problem List Patient Active Problem List   Diagnosis Date Noted  . MDD (major depressive  disorder), recurrent episode, severe ([place]) 02/10/2014  . Nonspecific abnormal electrocardiogram (ECG) (EKG) 02/07/2014  . Non-compliant behavior 02/03/2014  . Morton's neuroma of left foot 08/07/2013  . 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 Major depressive disorder, recurrent episode, severe ([place]) 03/13/2012  . Hyperglycemia 01/26/2012  . Hypogonadism male 01/24/2012  . Syncope and collapse 01/20/2012  .  OSA (obstructive sleep apnea) 01/17/2011  . CHEST TIGHTNESS 02/18/2008    Donyell Carrell W. 08/10/2015, 8:31 PM Frazier Butt., PT [facility] [address] [place], [place], [address] Phone: [phone]   Fax:  [phone]  Name: [name] [name] MRN: [id number] Date of Birth: 10-Oct-1950

## 2015-08-10 NOTE — Therapy (Signed)
[facility] [address] [place], [place], [address] Phone: [phone]   Fax:  [phone]  Speech Language Pathology Treatment  Patient Details  Name: [name] [name] MRN: [id number] Date of Birth: 04-Jan-1951 No Data Recorded  Encounter Date: 08/10/2015      End of Session - 08/10/15 1104    Visit Number 8   Number of Visits 16   Date for SLP Re-Evaluation 08/25/15   Authorization - Visit Number 7   Authorization - Number of Visits 18   SLP Start Time 4327   SLP Stop Time  1100   SLP Time Calculation (min) 42 min   Activity Tolerance Patient tolerated treatment well      Past Medical History  Diagnosis Date  . Headache(784.0)   . Cardiac murmur     as a child  . Streptococcal meningitis     as an infant  . Depression   .  OSA (obstructive sleep apnea) 01/17/2011    npsg 2012:  AHI 67/hr. Auto titration 2012:  Optimal pressure 12cm.   Marland Kitchen HEADACHES, HX OF 02/18/2008    Qualifier: Diagnosis of  By: Danny Lawless CMA, [place]    . APPENDECTOMY, HX OF 02/18/2008    Qualifier: Diagnosis of  By: Danny Lawless CMA, [place]    . Parkinson disease ([place]) 11/2014    Past Surgical History  Procedure Laterality Date  . Nasal sinus surgery      x 4 as a child  . Vasectomy    . Appendectomy  1967    There were no vitals filed for this visit.  Visit Diagnosis: Expressive aphasia  Hypokinetic Parkinsonian dysphonia  Dysphagia      Subjective Assessment - 08/10/15 1040    Subjective Pt did not routinely accomplish loud /a/ as prescribed last week.                ADULT SLP TREATMENT - 08/10/15 1030    General Information   Behavior/Cognition Alert;Cooperative;Pleasant mood   Treatment Provided   Treatment provided Cognitive-Linquistic   Pain Assessment   Pain Assessment No/denies pain   Cognitive-Linquistic Treatment   Treatment focused on Dysarthria;Aphasia   Skilled Treatment Pt reports driving the  majority of the time to [place] (approx 12 hour trip). SLP strongly cautioned pt to reconsider that situation again and maybe have other family members drive. Loud /a/ used to recalibrate pt's loudness at average 97dB with min A rarely for voice quality. In conversaiton (min complex), pt req'd min-mod cues occasionally for loudness, with average loudness 68dB without cues.    Assessment / Recommendations / Plan   Plan Continue with current plan of care   Progression Toward Goals   Progression toward goals Not progressing toward goals (comment)  regression in conversation likely due to less /a/ practice          SLP Education - 08/10/15 1104    Education provided Yes   Education Details driving long trips   [employer]) Educated Patient   Methods Explanation   Comprehension Verbalized understanding          SLP Short Term Goals - 08/10/15 1107    SLP SHORT TERM GOAL #1   Title Pt will produce loud /a/ average 88dB over 4 sessions   Time 2   Period Weeks   Status Partially Met   SLP SHORT TERM GOAL #2   Title Pt will demonstrate average of 70dB for in 10 minutes of mod complex conversaiton outside therapy  room over two sessions   Status Achieved   SLP SHORT TERM GOAL #3   Title pt will demo knowledge of s/s aspiration with modified independence   Time 2   Period Weeks   Status Deferred  for work on speech   SLP SHORT TERM GOAL #4   Title pt will tell SLP two compensations for pill dysphagia   Status Achieved   SLP SHORT TERM GOAL #5   Title pt will tell SLP three ways to compensate for aphasia   Time 2   Period Weeks   Status Not Met          SLP Long Term Goals - 08/10/15 1108    SLP LONG TERM GOAL #1   Title pt to maintain average 70dB in 15 minutes mod complex conversation outside [place] room over three sessions   Period Weeks  (or 16 visits)   Status On-going  pt regressed with this goal 08-10-15, after vacation of one week   SLP LONG TERM GOAL #2   Title Pt will  maintain an average of 70dBfor modcomplex conversation over 20 minutes with rare minimal assistance over 3 sessions    Time 6   Period Weeks   Status On-going   SLP LONG TERM GOAL #3   Title Pt will demo compensations for verbal expression in 10 minutes conversation   Time 4   Period Weeks   Status On-going   SLP LONG TERM GOAL #4   Title pt to demo knowledge of 3 signs/symptoms aspiration PNA   Time 4   Period Weeks   Status New          Plan - 08/10/15 1105    Clinical Impression Statement Pt with regression in conversaion likely due to not adhering to practice of loud /a/ while on vacation last week. Greater frequency today as well with rough sounding voice. Cues needed in conversation for loudness.   Speech Therapy Frequency 2x / week   Duration 4 weeks   Treatment/Interventions SLP instruction and feedback;Internal/external aids;Patient/family education;Functional tasks;Compensatory strategies;Aspiration precaution training   Potential to Achieve Goals Good   Potential Considerations Severity of impairments        Problem List Patient Active Problem List   Diagnosis Date Noted  . MDD (major depressive disorder), recurrent episode, severe ([place]) 02/10/2014  . Nonspecific abnormal electrocardiogram (ECG) (EKG) 02/07/2014  . Non-compliant behavior 02/03/2014  . Morton's neuroma of left foot 08/07/2013  . 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 Major depressive disorder, recurrent episode, severe ([place]) 03/13/2012  . Hyperglycemia 01/26/2012  . Hypogonadism male 01/24/2012  . Syncope and collapse 01/20/2012  .  OSA (obstructive sleep apnea) 01/17/2011  . CHEST TIGHTNESS 02/18/2008    Norris Bodley , MS, CCC-SLP  08/10/2015, 12:19 PM  [facility] [address] [place], [place], [address] Phone: [phone]   Fax:  [phone]   Name: [name] [name] MRN:  [id number] Date of Birth: [birth year]

## 2015-08-12 ENCOUNTER — Encounter: Payer: Self-pay | Admitting: Occupational Therapy

## 2015-08-12 ENCOUNTER — Ambulatory Visit: Payer: Federal, State, Local not specified - PPO

## 2015-08-12 ENCOUNTER — Ambulatory Visit: Payer: Federal, State, Local not specified - PPO | Admitting: Physical Therapy

## 2015-08-12 DIAGNOSIS — R269 Unspecified abnormalities of gait and mobility: Secondary | ICD-10-CM

## 2015-08-12 DIAGNOSIS — R4701 Aphasia: Secondary | ICD-10-CM | POA: Diagnosis not present

## 2015-08-12 DIAGNOSIS — G20 Parkinson's disease: Secondary | ICD-10-CM

## 2015-08-12 DIAGNOSIS — R258 Other abnormal involuntary movements: Secondary | ICD-10-CM

## 2015-08-12 DIAGNOSIS — R490 Dysphonia: Secondary | ICD-10-CM

## 2015-08-12 DIAGNOSIS — R1310 Dysphagia, unspecified: Secondary | ICD-10-CM

## 2015-08-12 NOTE — Therapy (Signed)
[facility] [address] [place], [place], [address] Phone: [phone]   Fax:  [phone]  Speech Language Pathology Treatment  Patient Details  Name: [name] [name] MRN: [id number] Date of Birth: May 25, 1951 No Data Recorded  Encounter Date: 08/12/2015      End of Session - 08/12/15 1228    Visit Number 9   Number of Visits 16   Date for SLP Re-Evaluation 08/25/15   Authorization - Visit Number 8   Authorization - Number of Visits 18   SLP Start Time 1020   SLP Stop Time  1101   SLP Time Calculation (min) 41 min   Activity Tolerance Patient tolerated treatment well      Past Medical History  Diagnosis Date  . Headache(784.0)   . Cardiac murmur     as a child  . Streptococcal meningitis     as an infant  . Depression   .  OSA (obstructive sleep apnea) 01/17/2011    npsg 2012:  AHI 67/hr. Auto titration 2012:  Optimal pressure 12cm.   Marland Kitchen HEADACHES, HX OF 02/18/2008    Qualifier: Diagnosis of  By: Danny Lawless CMA, [place]    . APPENDECTOMY, HX OF 02/18/2008    Qualifier: Diagnosis of  By: Danny Lawless CMA, [place]    . Parkinson disease ([place]) 11/2014    Past Surgical History  Procedure Laterality Date  . Nasal sinus surgery      x 4 as a child  . Vasectomy    . Appendectomy  1967    There were no vitals filed for this visit.  Visit Diagnosis: Hypokinetic Parkinsonian dysphonia  Expressive aphasia  Dysphagia      Subjective Assessment - 08/12/15 1026    Subjective Pt completed loud /a/ reps once yesterday.                ADULT SLP TREATMENT - 08/12/15 1029    General Information   Behavior/Cognition Alert;Cooperative;Pleasant mood   Treatment Provided   Treatment provided Cognitive-Linquistic   Pain Assessment   Pain Assessment No/denies pain   Cognitive-Linquistic Treatment   Treatment focused on Dysarthria;Aphasia   Skilled Treatment Pt reports he would like to complete his ST today  and work on aphasia at a later date. SLP encouraged pt to complete loud /a/ reps twice a day in order to afford him the best opportunity to maintain louder speech. Loud /a/ completed today with average 88dB in order to objectify "loud" in pt's verbal output. Two-sentence responses with 85% maintaining WNLspeech loudness. Pt's voice quality was WNL approx 50% of the time. In simple conversation of 12 minutes pt maintained 70dB 85% of the time.   Assessment / Recommendations / Plan   Plan Continue with current plan of care   Progression Toward Goals   Progression toward goals --  pt desires d/c today. See goal update and d/c summary            SLP Short Term Goals - 08/12/15 1230    SLP SHORT TERM GOAL #1   Title Pt will produce loud /a/ average 88dB over 4 sessions   Time 2   Period Weeks   Status Partially Met   SLP SHORT TERM GOAL #2   Title Pt will demonstrate average of 70dB for in 10 minutes of mod complex conversaiton outside therapy room over two sessions   Status Achieved   SLP SHORT TERM GOAL #3   Title pt will demo knowledge of s/s  aspiration with modified independence   Time 2   Period Weeks   Status Deferred  for work on speech   [place] #4   Title pt will tell SLP two compensations for pill dysphagia   Status Achieved   SLP SHORT TERM GOAL #5   Title pt will tell SLP three ways to compensate for aphasia   Time 2   Period Weeks   Status Not Met          SLP Long Term Goals - 08/12/15 1230    SLP LONG TERM GOAL #1   Title pt to maintain average 70dB in 15 minutes mod complex conversation outside [place] room over three sessions   Period Weeks  (or 16 visits)   Status Partially Met  pt regressed with this goal 08-10-15, after vacation of one week   SLP LONG TERM GOAL #2   Title Pt will maintain an average of 70dBfor modcomplex conversation over 20 minutes with rare minimal assistance over 3 sessions    Time 6   Period Weeks   Status Not Met   SLP  LONG TERM GOAL #3   Title Pt will demo compensations for verbal expression in 10 minutes conversation   Time 4   Period Weeks   Status Deferred   SLP LONG TERM GOAL #4   Title pt to demo knowledge of 3 signs/symptoms aspiration PNA   Time 4   Period Weeks   Status Not Met  only enforced for a miniimal amount of time          Plan - 08/12/15 1229    Clinical Impression Statement Pt maintained 70dB or louder average today in 10+ minutes simple conversation. SLP reiterated strongly pt must cont with loud /a/ daily. See d/c summary   Potential Considerations Severity of impairments     SPEECH THERAPY DISCHARGE SUMMARY  Visits from Start of Care: 9  Current functional level related to goals / functional outcomes: See goal summary above. 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 Pt meant in his next therapy course, not in addition to this therapy course after work on speech deficits, as SLP thought. Pt arrived today requesting discharge. Pt remarked to SLP that his PT/PTA were "shocked" that he did his exercises between sessions because he normally does not do them.  The pt has had a challenge with completing loud /a/ routinely.  Remaining deficits: Mild dysarthria, worsening with more complex conversation due to incr'd difficulty with word finding.   Education / Equipment: Need for loud /a/ as a speech recalibration agent, need to complete /a/ as directed. Plan: Patient agrees to discharge.  Patient goals were partially met. Patient is being discharged due to being pleased with the current functional level.  ?????        Problem List Patient Active Problem List   Diagnosis Date Noted  . MDD (major depressive disorder), recurrent episode, severe ([place]) 02/10/2014  . Nonspecific abnormal electrocardiogram (ECG) (EKG) 02/07/2014  . Non-compliant behavior 02/03/2014  . Morton's neuroma of left  foot 08/07/2013  . 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 Major depressive disorder, recurrent episode, severe ([place]) 03/13/2012  . Hyperglycemia 01/26/2012  . Hypogonadism male 01/24/2012  . Syncope and collapse 01/20/2012  .  OSA (obstructive sleep apnea) 01/17/2011  . CHEST TIGHTNESS 02/18/2008    SCHINKE,CARL , MS, CCC-SLP  08/12/2015, 4:31 PM  [facility]  [facility] [address] [place], [place], [address] Phone: [phone]   Fax:  [phone]   Name: [name] [name] MRN: [id number] Date of Birth: 11/16/1950

## 2015-08-12 NOTE — Patient Instructions (Signed)
You must continue with loud "ah" 5 times, twice each day.

## 2015-08-13 NOTE — Therapy (Signed)
[facility] [address] [place], [place], [address] Phone: [phone]   Fax:  [phone]  Physical Therapy Treatment  Patient Details  Name: [name] [name] MRN: [id number] Date of Birth: 1951-04-26 No Data Recorded  Encounter Date: 08/12/2015      PT End of Session - 08/13/15 1447    Visit Number 9   Number of Visits 9   Date for PT Re-Evaluation 08/24/15   Authorization Type [employer] 75 visit limit combined (56 remain per eval note-PT to check with insurance coordinator)-17 were used by PT previously; 50 ttoal used this year; avoid going over 25 visits this bout of therapy-07/15/15 AWM per email with KG   PT Start Time 0846   PT Stop Time 0926   PT Time Calculation (min) 40 min   Activity Tolerance Patient tolerated treatment well   Behavior During Therapy [facility] for tasks assessed/performed      Past Medical History  Diagnosis Date  . Headache(784.0)   . Cardiac murmur     as a child  . Streptococcal meningitis     as an infant  . Depression   .  OSA (obstructive sleep apnea) 01/17/2011    npsg 2012:  AHI 67/hr. Auto titration 2012:  Optimal pressure 12cm.   Marland Kitchen HEADACHES, HX OF 02/18/2008    Qualifier: Diagnosis of  By: Danny Lawless CMA, [place]    . APPENDECTOMY, HX OF 02/18/2008    Qualifier: Diagnosis of  By: Danny Lawless CMA, [place]    . Parkinson disease ([place]) 11/2014    Past Surgical History  Procedure Laterality Date  . Nasal sinus surgery      x 4 as a child  . Vasectomy    . Appendectomy  1967    There were no vitals filed for this visit.  Visit Diagnosis:  Abnormality of gait  Bradykinesia      Subjective Assessment - 08/12/15 0852    Subjective Did exercises in the past several days-plan to continue   Currently in Pain? Yes  intermittent pain in mouth-had root canal 2 days ago            [facility] PT Assessment - 08/13/15 0001    Observation/Other Assessments   Focus on Therapeutic Outcomes  (FOTO)  Functional Status measure:  73: neuro QOL:  43.1                     [facility] Adult PT Treatment/Exercise - 08/13/15 0001    Ambulation/Gait   Ambulation/Gait Yes   Ambulation/Gait Assistance 7: Independent   Ambulation/Gait Assistance Details Cues provided for upright posture, arm swing, foot clearance   Ambulation Distance (Feet) 400 Feet   Assistive device None           [facility]) - 08/12/15 0854    PWR! exercises Moves in sitting;Moves in standing   PWR! Up x 20   PWR! Rock x 20   PWR! Twist x 20    PWR Step x 20   Comments PWR! Standing:  Pt performs exercises well as part of HEP   PWR! Up x 20   PWR! Rock x 20   PWR! Twist x 20   PWR! Step x 20   Comments PWR! Sitting:  Pt performs well as part of HEP    Pt performs HEP independently; needs verbal cues for intensity.   Self Care:  FOTO completed, as noted above.  Pt agrees to return eval in 6 months.  PT Education - 08/12/15 [phone]    Education provided Yes   Education Details  reviewed HEP; reviewed optimal PD fitness upon D/C from PT; plans for discharge, plans for return in  78month   Person(s) Educated Patient   Methods Explanation   Comprehension Verbalized understanding             PT Long Term Goals - 08/12/15 01694   PT LONG TERM GOAL #1   Title Pt will be independent with progressive HEP for improved functional mobility and gait.  TARGET 07/25/15  EXTEND GOAL CHECK DATE by 2 weeks, as weeks remain in POC-UPDATED TARGET:  08/08/15   Time 4   Period Weeks   Status Achieved   PT LONG TERM GOAL #2   Title Pt will improve TUG score to less than or equal to 13.5 seconds for decreased fall risk.   Time 4   Period Weeks   Status Achieved   PT LONG TERM GOAL #3   Title Pt will improve TUG cognitive score to less than or equal to 15 seconds for decreased fall risk and improved ability to multi-task.   Baseline 14.5 sec 08/10/15   Time 4   Period Weeks   Status Achieved   PT  LONG TERM GOAL #4   Title Pt will improve Functional Gait Assessment to at least 20/30 for decreased fall risk.   Time 4   Period Weeks   Status Achieved   PT LONG TERM GOAL #5   Title Pt will improve 5x sit<>stand to less than or equal to 15 seconds for improved efficiency and safety with transfers.   Baseline 14.49 seconds 08/10/15   Time 4   Period Weeks   Status Achieved   PT LONG TERM GOAL #6   Title Pt will verbalize plans for continued community fitness upon D/C from PT.   Time 4   Period Weeks   Status Achieved               Plan - 08/13/15 1449    Clinical Impression Statement Pt has met all LTGs.  Pt is independent with HEP and appears to be more motivated for exercising at home.  Pt has made functional gains during this course of therapy, and appears to be ready for D/C.   Pt will benefit from skilled therapeutic intervention in order to improve on the following deficits Abnormal gait;Decreased balance;Decreased mobility;Decreased coordination;Decreased strength;Difficulty walking;Postural dysfunction   Rehab Potential Good   PT Frequency 2x / week   PT Duration 4 weeks  plus eval   PT Treatment/Interventions ADLs/Self Care Home Management;Therapeutic exercise;Therapeutic activities;Functional mobility training;Gait training;Balance training;Neuromuscular re-education;Patient/family education   PT Next Visit Plan Discharge this visit   PT Home Exercise Plan PWR! Moves, transfers, walking program   Consulted and Agree with Plan of Care Patient        Problem List Patient Active Problem List   Diagnosis Date Noted  . MDD (major depressive disorder), recurrent episode, severe ([place] 02/10/2014  . Nonspecific abnormal electrocardiogram (ECG) (EKG) 02/07/2014  . Non-compliant behavior 02/03/2014  . Morton's neuroma of left foot 08/07/2013  . 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 Major depressive disorder, recurrent episode,  severe ([place] 03/13/2012  . Hyperglycemia 01/26/2012  . Hypogonadism male 01/24/2012  . Syncope and collapse 01/20/2012  .  OSA (obstructive sleep apnea) 01/17/2011  . CHEST TIGHTNESS 02/18/2008    Vannie Hilgert W. 08/13/2015, 3:04 PM  MFrazier Butt, PT  [facility] [address] [place], [place], [address] Phone: [phone]   Fax:  [phone]  Name: [name] [name] MRN: [id number] Date of Birth: [birth year]   PHYSICAL THERAPY DISCHARGE SUMMARY  Visits from Start of Care: 9  Current functional level related to goals / functional outcomes: See long term goals above-pt has met 5 of 5 goals.   Remaining deficits: Bradykinesia, posture, abnormal gait   Education / Equipment: Educated in [employer], optimal fitness program.  Plan: Patient agrees to discharge.  Patient goals were met. Patient is being discharged due to meeting the stated rehab goals.  ?????       Mady Haagensen, PT 08/13/2015 3:08 PM Phone: [phone] Fax: [phone]

## 2015-08-24 ENCOUNTER — Ambulatory Visit (HOSPITAL_COMMUNITY): Payer: Self-pay | Admitting: Psychiatry

## 2015-08-25 ENCOUNTER — Ambulatory Visit (INDEPENDENT_AMBULATORY_CARE_PROVIDER_SITE_OTHER): Payer: Federal, State, Local not specified - PPO | Admitting: Neurology

## 2015-08-25 ENCOUNTER — Encounter: Payer: Self-pay | Admitting: Neurology

## 2015-08-25 VITALS — BP 126/72 | HR 74 | Ht 74.0 in | Wt 279.0 lb

## 2015-08-25 DIAGNOSIS — G20 Parkinson's disease: Secondary | ICD-10-CM | POA: Diagnosis not present

## 2015-08-25 DIAGNOSIS — G3184 Mild cognitive impairment, so stated: Secondary | ICD-10-CM

## 2015-08-25 DIAGNOSIS — F332 Major depressive disorder, recurrent severe without psychotic features: Secondary | ICD-10-CM

## 2015-08-25 MED ORDER — ROPINIROLE HCL 2 MG PO TABS: ORAL_TABLET | ORAL | Status: DC

## 2015-08-25 NOTE — Progress Notes (Signed)
[name] [name] was seen today in the movement disorders clinic for neurologic consultation at the request of [name], MD.  The consultation is for the evaluation of slowness, flat affect and to r/o a neurologic disorder.  This patient is accompanied in the office by his spouse who supplements the history.  The first symptom(s) the patient noticed was a feeling of weakness that has been going on for about a year.  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  For about a year and a half he has noted a "body" tremor and his wife has noted a hand tremor in both hands (at rest).  He is stiff like he is "a 64 year old man."  Once he is in bed he finds it is hard to move.  The patient has a hx of significant depression and is on multiple medications related to this.  He was started on cogentin just yesterday, but is also on celexa, wellbutrin and latuda.  He has been on [place] for 1.5 months.  Prior to that he was on abilify (tried on 2 separate times but most recently was only on it for a day) and geodon(doesn't remember this but looks like it may have been given in the past).  He has been on risperdal but cannot remember how long ago.  ECT was offered but his wife does not want him to proceed with that  11/20/14 update:  The patient returns today for follow-up.  He is accompanied by his wife who supplements the history.  He has been off of [place] for about 2 weeks.  Unfortunately, he was unable to afford the dat scan.  He is on just Wellbutrin and Celexa for depression and his Cogentin and has been discontinued.  He has noted a decreased appetite and dry mouth and having trouble sleeping because of stiffness.  Pt states that he is almost afraid to go to sleep.  Missing 2 days of work per week (works nights) but a lot of that because of anxiety.  His wife notices increased tremor.  He has slow at work.  Admits to some  lightheadedness but no syncope.  Note diplopia.  No hallucinations.  His wife is doing most of the driving because he is having difficulty staying in his own lane.  02/19/15 update:  The patient is following up today regarding his parkinsonism.  I reviewed records from his psychiatrist since last visit.  His psychiatry note from 12/04/14 indicates that the patient stated that tremor was better.  However, I received a call 4 days later stating that tremor was worse and he thought it was from the Mirapex.  He wanted to change the Mirapex to something else.  He was on Mirapex 0.5 mg 3 times per day at the time.  We ended up switching it to Requip 1 mg 3 times a day.  Fortunately he is doing better.  He denies any side effects with the Requip.  He currently takes it at 8 AM/2 PM/8 PM.  He does state that he notices that he "shuffles with the left leg."  He has not fallen but sometimes feels off balance.  He states that he finished his physical/occupational/speech therapy and feels that it went well.  He is not exercising on his own.  He initially states that he does not feel that he has time, but also states that he does not  go into work until 2 PM and works until 10:30 PM.  This is a change for him.  He was able to change his job at [employer] and seems like that is doing better.  His wife mentions that he continues to have memory loss that she thinks has increased, but she also states that she has noticed memory loss ever since starting on antidepressants.  He had one fall since last visit.  This was when he was taking the trash to the street.  He had no fractures with this, just a superficial scrape.    05/25/15 update:  The patient presents today, accompanied by his wife who supplements the history.  His Requip was increased last visit so that was taking 2 tablets in the morning, one in afternoon and 2 in the evening and 1 day after I started this, I received a call from his wife that the patient did not  tolerate it and it made symptoms worse (which is what they reported with Mirapex).  While that was very odd to me, I told them to go back down to 1 mg 3 times per day.  He actually comes back on 1 mg, 1 in the AM, 2 in the afternoon and 1 in the evening.  He reports that he is doing worse.  He has more tremor and he feels tired in the evening.  Pts wife asks about trying to go back up on the requip slowly.   He had neuropsych testing in June at [facility].  I reviewed that.  There was no evidence of dementia.  There was evidence of mild cognitive impairment from Parkinson's disease as well as from chronic major depressive disorder.  It is recommended that the patient consider transcranial magnetic stimulation for depression, and he told the neuropsychologist that he considered in the past and opted against it.  She encouraged him to attend a Parkinson's support group and he told her that he tried that once and it was not helpful.  He still c/o "my memory has been terrible and especially over the last few weeks."  He feels that he is not depressed - "that is well controlled."  He is not doing CV exercise.  He is still in his new job but it seems more physical than his old job and he thinks he is not as fast in getting tasks done.  He asks about DBS.  08/25/15 update:  The patient is following up today, accompanied by his wife who supplements the history.  I have reviewed records since our last visit.  He is on Requip XL, which was increased to 6 mg at night last visit.  He changed that back to 2 mg tid of the regular ropinirole because of cost.   He is enrolled in physical therapy.  "I'm doing bad."  When asked to describe how he feels bad, he has trouble describing it.  He had a root canal and hasn't felt well since.  He hasn't been back to work for 4 weeks.  He feels that his speech is low and states that he went to voice therapy 2 weeks ago.  He isn't practicing the voice therapy faithfully.  He is  riding the bike on the highest resistance 20 mins a day.  He is having word finding trouble.   No falls but feels that he is "weaker."  He asks me about going to "light duty" at work and possibly retirement in the near future.  He  continues to see psychiatry for his depression.  He last saw psychiatry on 06/04/2015.  No changes were made in medication as he stated that mood was good and continues to reaffirm that.    Neuroimaging has previously been performed.  It is available for my review today and I reviewed it with him.  There is no BG disease.  ALLERGIES:  No Known Allergies  CURRENT MEDICATIONS:  Outpatient Encounter Prescriptions as of 08/25/2015  Medication Sig  . B Complex-C (B-COMPLEX WITH VITAMIN C) tablet Take 1 tablet by mouth daily.  Marland Kitchen buPROPion (WELLBUTRIN XL) 300 MG 24 hr tablet Take 1 tablet (300 mg total) by mouth every morning.  . citalopram (CELEXA) 40 MG tablet Take 1 tablet (40 mg total) by mouth daily.  . Omega 3-6-9 Fatty Acids (OMEGA 3-6-9 PO) Take by mouth.  . Ropinirole HCl (REQUIP XL) 6 MG TB24 Take 1 tablet (6 mg total) by mouth daily. (Patient taking differently: Take 6 tablets by mouth daily. )  . TURMERIC CURCUMIN PO Take by mouth.   No facility-administered encounter medications on file as of 08/25/2015.    PAST MEDICAL HISTORY:   Past Medical History  Diagnosis Date  . Headache(784.0)   . Cardiac murmur     as a child  . Streptococcal meningitis     as an infant  . Depression   .  OSA (obstructive sleep apnea) 01/17/2011    npsg 2012:  AHI 67/hr. Auto titration 2012:  Optimal pressure 12cm.   Marland Kitchen HEADACHES, HX OF 02/18/2008    Qualifier: Diagnosis of  By: Danny Lawless CMA, [place]    . APPENDECTOMY, HX OF 02/18/2008    Qualifier: Diagnosis of  By: Danny Lawless CMA, [place]    . Parkinson disease ([place]) 11/2014    PAST SURGICAL HISTORY:   Past Surgical History  Procedure Laterality Date  . Nasal sinus surgery      x 4 as a child  . Vasectomy    . Appendectomy   1967    SOCIAL HISTORY:   Social History   Social History  . Marital Status: Married    Spouse Name: N/A  . Number of Children: Y  . Years of Education: N/A   Occupational History  . 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 Smoking status: Never Smoker   . Smokeless tobacco: Never Used  . Alcohol Use: No  . Drug Use: No  . Sexual Activity: Yes    Birth Control/ Protection: None   Other Topics Concern  . Not on file   Social History Narrative    FAMILY HISTORY:   Family Status  Relation Status Death Age  . Father Deceased     lung cancer, alzheimer's  . Mother Alive     HTN, A fib  . Brother Deceased     accident  . Sister Alive     healthy  . Son Alive     healthy  . Daughter Alive     healthy  . Daughter Alive     healthy  . Maternal Aunt Alive     Parkinson's Disease    ROS:  A complete 10 system review of systems was obtained and was unremarkable apart from what is mentioned above.  PHYSICAL EXAMINATION:    VITALS:   There were no vitals filed for this visit. Wt Readings from Last 3 Encounters:  06/04/15 264 lb (119.75 kg)  05/25/15 264 lb (119.75 kg)  03/16/15 254 lb (115.214 kg)     GEN:  The patient appears stated age and is in NAD. HEENT:  Normocephalic, atraumatic.  The mucous membranes are moist. The superficial temporal arteries are without ropiness or tenderness. CV:  RRR Lungs:  CTAB Neck/HEME:  There are no carotid bruits bilaterally.  Neurological examination:  Orientation: The patient is alert and oriented x3. Fund of knowledge is appropriate.  Recent and remote memory are intact.  Attention and concentration are normal.    Able to name objects and repeat phrases. Cranial nerves: There is good facial symmetry. 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  Pupils are equal round and reactive to light bilaterally. Fundoscopic exam reveals clear margins bilaterally. Extraocular muscles are intact. There are no  square wave jerks.  The visual fields are full to confrontational testing. The speech is fluent and clear.  He is hypophonic.  The patient is able to make the gutteral sounds without difficulty. Soft palate rises symmetrically and there is no tongue deviation. Hearing is intact to conversational tone. Sensation: Sensation is intact to light and pinprick throughout (facial, trunk, extremities). Vibration is intact at the bilateral big toe. There is no extinction with double simultaneous stimulation. There is no sensory dermatomal level identified. Motor: Strength is 5/5 in the bilateral upper and lower extremities.   Shoulder shrug is equal and symmetric.  There is no pronator drift.   Movement examination: Tone: There is normal tone bilaterally, but there is a degree of gegenhalten/paratonia in the upper extremities bilaterally Abnormal movements: No tremor noted today even with distraction Coordination:  There is decremation with RAM's, seen with hand opening and closing on the left, finger taps on the left, alternation of supination/pronation on the left, heel taps and toe taps on the left. Gait and Station: The patient has no difficulty arising out of a deep-seated chair without the use of the hands.   The patient's stride length is just slightly decreased with decreased arm swing on the left.    His pull test was negative today.  ASSESSMENT/PLAN:  1.  Parkinsonism  -While he certainly could have idiopathic PD, especially since he was only on the [place] for 6-8 weeks and sx's preceded that, he was exposed to multiple other antipsychotics and I am unsure of the timeline of those exposures.  He has been off of [place] since early February, and his examination had improved when he got off of Latuda, even before starting on Requip.    -He will increase requip to 4 mg in the AM and continue 2 mg in the afternoon and evening.  -I wrote him a letter for work.  Told him that I suspect that he needs a form  but he asked me for a note accomodating light duty 2  Depression  -The patient reports that this has been stable.  He is not suicidal or homicidal.  He will remain on Celexa and Wellbutrin and remain under the care of psychiatry. 3.  Mild cognitive impairment   -The patient had neuropsych testing done in June, 2016 and he does not have dementia.  I talked to him about mild cognitive impairment and also told him that the neuropsychologist as well as myself think that depression plays a significant role (pseudodementia).   Continues to report memory issues and I continue to think that those are from depression.  Talked about importance of routine. 4.  Follow-up with me will be in the next 3 months, sooner should new neurologic issues arise.  In the meantime, he is to ask PCP about RX viagra (asked me about it and told him this is far out of my area of expertise).   Much greater than 50% of this visit was spent in counseling with the patient and the family.  Total face to face time:  25 min

## 2015-09-03 ENCOUNTER — Telehealth: Payer: Self-pay | Admitting: Neurology

## 2015-09-03 NOTE — Telephone Encounter (Signed)
Spoke with patient and he would like it added to her letter that he can only work 4 hours a day until January. He could give no reason for this to end in January. I explained to patient that she would probably be okay writing that he could work 4 hours a day but it would need to be permanent since she is treating him for a chronic condition and not a temporary one. Patient will think this over and let us know if he wants this added to her note. He will call back.

## 2015-09-03 NOTE — Telephone Encounter (Signed)
Pt wants to talk to [name] [name] he wants  A letter for work from Dr Tat but would not tell me what kind please call at [phone]

## 2015-09-07 ENCOUNTER — Telehealth: Payer: Self-pay | Admitting: Neurology

## 2015-09-07 ENCOUNTER — Ambulatory Visit (INDEPENDENT_AMBULATORY_CARE_PROVIDER_SITE_OTHER): Payer: Federal, State, Local not specified - PPO | Admitting: Psychiatry

## 2015-09-07 ENCOUNTER — Encounter: Payer: Self-pay | Admitting: Neurology

## 2015-09-07 ENCOUNTER — Encounter (HOSPITAL_COMMUNITY): Payer: Self-pay | Admitting: Psychiatry

## 2015-09-07 VITALS — BP 114/74 | HR 81 | Ht 74.0 in | Wt 281.6 lb

## 2015-09-07 DIAGNOSIS — F332 Major depressive disorder, recurrent severe without psychotic features: Secondary | ICD-10-CM | POA: Diagnosis not present

## 2015-09-07 DIAGNOSIS — G3184 Mild cognitive impairment, so stated: Secondary | ICD-10-CM | POA: Diagnosis not present

## 2015-09-07 MED ORDER — CITALOPRAM HYDROBROMIDE 40 MG PO TABS: 40.0000 mg | ORAL_TABLET | Freq: Every day | ORAL | Status: DC

## 2015-09-07 MED ORDER — BUPROPION HCL ER (XL) 300 MG PO TB24: 300.0000 mg | ORAL_TABLET | Freq: Every morning | ORAL | Status: DC

## 2015-09-07 NOTE — Progress Notes (Signed)
[name] Progress Note  HUDIE [name] [id number] 64 y.o.  09/07/2015 2:54 PM  Chief Complaint:  Medication management and follow-up.              History of Present Illness:  [name] [name] came for his follow-up appointment with his wife.  He is taking his psychiatric medication and denies any depressive symptoms.  However he is still complaining of tremors and memory impairment.  He has been not going to work since October but he like to go back part-time next week.  He saw a neurologist [name] Tat and now he is taking Requip up to 8 mg.  He has not seen unremarkable improvement in his shakes and tremors .  He does not report any hopelessness, anhedonia or any suicidal thoughts but admitted fatigue, lack of energy, slow thinking and having difficulty finding words.  He is anxious because he is not getting treatment for Parkinson .  He told that his neurologist does not feel comfortable treating him for levodopa as he is too young .  I called neurologist and spoke to Dr. [name] who mentioned that patient does not have enough symptoms to treat with levodopa but she agreed to give a levodopa challenge in the office.  At this time I will not change any his antidepressant since he does not exhibit any depression.  Though he still have cognitive impairment, sluggishness, flat affect , tremors which could be symptoms of Parkinson.  He denies any hallucination, paranoia, agitation, mania or any suicidal thoughts.  Patient also does not want to change his antidepressant.  His appetite is okay.  His vitals are stable.  Patient denies drinking or using any illegal substances.  He lives with his wife was very supportive.  Suicidal Ideation: No Plan Formed: No Patient has means to carry out plan: No  Homicidal Ideation: No Plan Formed: No Patient has means to carry out plan: No  Review of Systems  Constitutional: Negative for weight loss.  HENT: Negative.   Cardiovascular: Negative for  chest pain and palpitations.  Musculoskeletal: Negative for myalgias and back pain.  Skin: Negative for itching and rash.  Neurological: Positive for tremors. Negative for dizziness and headaches.  Psychiatric/Behavioral: Negative for depression, suicidal ideas, hallucinations and substance abuse. The patient does not have insomnia.    Psychiatric: Agitation: No Hallucination: No Depressed Mood: No Insomnia: No Hypersomnia: Yes Altered Concentration: No Feels Worthless: No Grandiose Ideas: No Belief In Special Powers: No New/Increased Substance Abuse: No Compulsions: No  Neurologic: Headache: No Seizure: No Paresthesias: No  Past Medical History: Hypogonadism, Parkinson  Social history. Patient is employed and married.  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  Outpatient Encounter Prescriptions as of 09/07/2015  Medication Sig  . B Complex-C (B-COMPLEX WITH VITAMIN C) tablet Take 1 tablet by mouth daily.  Marland Kitchen buPROPion (WELLBUTRIN XL) 300 MG 24 hr tablet Take 1 tablet (300 mg total) by mouth every morning.  . citalopram (CELEXA) 40 MG tablet Take 1 tablet (40 mg total) by mouth daily.  . Omega 3-6-9 Fatty Acids (OMEGA 3-6-9 PO) Take by mouth.  Marland Kitchen rOPINIRole (REQUIP) 1 MG tablet 2 tablets TID  . rOPINIRole (REQUIP) 2 MG tablet Take 2 tablets in the AM, 1 in the afternoon and 1 in the evening  . TURMERIC CURCUMIN PO Take by mouth.  . [DISCONTINUED] buPROPion (WELLBUTRIN XL) 300 MG 24 hr tablet Take 1 tablet (300 mg total) by mouth every morning.  . [  DISCONTINUED] citalopram (CELEXA) 40 MG tablet Take 1 tablet (40 mg total) by mouth daily.   No facility-administered encounter medications on file as of 09/07/2015.    Past Psychiatric History/Hospitalization(s): Patient denies any history of suicidal attempt or any inpatient psychiatric treatment.  He denies any history of mania, psychosis, paranoia or any hallucination.  In the past he had tried Effexor,  Abilify, Paxil, Cymbalta, Zoloft, amitriptyline, Pristiq, Britnellex, Celexa, Deplin and Vyvanse.  He is seen in this office since September 2009.  He has seen multiple psychiatrists in the past.  Anxiety: No Bipolar Disorder: No Depression: Yes Mania: No Psychosis: No Schizophrenia: No Personality Disorder: No Hospitalization for psychiatric illness: No History of Electroconvulsive Shock Therapy: No Prior Suicide Attempts: No  Physical Exam: Constitutional:  BP 114/74 mmHg  Pulse 81  Ht 6\' 2"  (1.88 m)  Wt 281 lb 9.6 oz (127.733 kg)  BMI 36.14 kg/m2  No results found for this or any previous visit (from the past 2160 hour(s)).  General Appearance: alert, oriented, no acute distress  Musculoskeletal: Strength & Muscle Tone: decreased Gait & Station: normal Patient leans: N/A   Mental Status Examination: Patient is casually dressed and fairly groomed.  He maintained fair eye contact.  His speech is slow with decreased volume and tone.  His response to the question is slow.  His thought process is slow but coherent.  His affect is flat.  He denies any auditory or visual hallucination.  He denies any active or passive suicidal thoughts and homicidal thoughts.  There were no delusions or any paranoia.  His attention and concentration is fair. He described his mood euthymic and his affect is flat.  He has some difficulty remembering things but he is alert and oriented 3.  His psychomotor activity is decreased.  His fund of knowledge is average.  His insight judgment and impulse control is okay.  Established Problem, Stable/Improving (1), Review or order clinical lab tests (1), Discuss test with performing physician (1), Decision to obtain old records (1), Review and summation of old records (2), Review of Last Therapy Session (1) and Review of Medication Regimen & Side Effects (2)  Assessment: Axis I: Maj. depressive disorder, recurrent, severe.  Mild cognitive impairment.   Axis II:  Deferred  Axis III: Hypogonadism, Parkinson   Plan:  I reviewed records from his neurologist, current medication and psychosocial stressors.  He is hoping increase weekly would help his tremors .  He is scheduled to go back to part-time work next Tuesday.  I called neurologist and did discuss parkinsonian symptoms in detail.  His neurologist Dr. [name] Tat agree to give levodopa challenge to see if patient can tolerate treatment.  At this time we will defer any medication, patient also does not want to change the Wellbutrin and Celexa. I also offered counseling but patient declined.  Continue Wellbutrin XL 300 mg daily and Celexa 40 mg daily.  Discussed medication side effects and benefits.  Recommended to call us back if he has any question or any concern.  Time spent 25 minutes.  More than 50% of the time is spent in psychoeducation, counseling and coordination of care.  Patient was given enough time to ask question about his medication, diagnoses her long-term prognosis.  Follow-up in 3 months  ARFEEN,SYED T., MD 09/07/2015

## 2015-09-07 NOTE — Telephone Encounter (Signed)
LMOM for patient to call back. To set up on/off test for patient by request of behavioral health. Awaiting call back.

## 2015-09-14 ENCOUNTER — Telehealth: Payer: Self-pay | Admitting: Neurology

## 2015-09-14 NOTE — Telephone Encounter (Signed)
Pt needs to talk to someone about his medication levedopa please call [phone]

## 2015-09-14 NOTE — Telephone Encounter (Signed)
  Patient called to cancel on/off testing. He is not interested in taking Levodopa at this time.

## 2015-09-15 ENCOUNTER — Ambulatory Visit (INDEPENDENT_AMBULATORY_CARE_PROVIDER_SITE_OTHER): Payer: Federal, State, Local not specified - PPO | Admitting: Internal Medicine

## 2015-09-15 ENCOUNTER — Encounter: Payer: Self-pay | Admitting: Internal Medicine

## 2015-09-15 VITALS — BP 130/84 | HR 69 | Temp 98.6°F | Resp 16 | Ht 74.0 in | Wt 285.0 lb

## 2015-09-15 DIAGNOSIS — N529 Male erectile dysfunction, unspecified: Secondary | ICD-10-CM | POA: Insufficient documentation

## 2015-09-15 DIAGNOSIS — R259 Unspecified abnormal involuntary movements: Secondary | ICD-10-CM | POA: Insufficient documentation

## 2015-09-15 DIAGNOSIS — F332 Major depressive disorder, recurrent severe without psychotic features: Secondary | ICD-10-CM

## 2015-09-15 MED ORDER — SILDENAFIL CITRATE 50 MG PO TABS: 50.0000 mg | ORAL_TABLET | Freq: Every day | ORAL | Status: DC | PRN

## 2015-09-15 NOTE — Assessment & Plan Note (Signed)
Has made some progress with requip. Not much change with increasing dose from 6 to 8 mg daily. He is taking TID. They were scheduled to try levodopa but have decided to go with the neurologist's advice. Will forward to her to keep her informed of progress.

## 2015-09-15 NOTE — Patient Instructions (Signed)
We have sent in the viagra to try. Take 1 pill as needed for erections. Please seek medical attention for erections lasting more than 4 hours.   Keep up the good work with exercise and I will send a note to Dr. Carles Collet with the summary for today and what you wanted me to convey.

## 2015-09-15 NOTE — Progress Notes (Signed)
Pre visit review using our clinic review tool, if applicable. No additional management support is needed unless otherwise documented below in the visit note. 

## 2015-09-15 NOTE — Assessment & Plan Note (Signed)
Appears to be stable at this time on celexa and wellbutrin. Seeing psych closely for monitoring.

## 2015-09-15 NOTE — Progress Notes (Signed)
   Subjective:    Patient ID: [name] [name], male    DOB: [birth year], 64 y.o.   MRN: [id number]  HPI The patient is a 64 YO man coming in with several concerns. He is wanting to try viagra for ED. Has had the problem for some time. Has not tried anything for it in the past. Is on antidepressants and he feels that they may be the cause of some of the trouble but mood is doing well and wants to continue on that.  Next concern is his inability to do much of anything. He has tried going back to work for half time and doing a different job. He was unable to finish a shift recently and his wife had to go pick him up as coworkers did not think he was safe to drink himself home. He cannot describe what happened or why he was having problems. He does 20 minutes on an exercise bike daily. His neurologist recently changed his requip and he has not noticed much more benefit. Perhaps some difference in the tremors. Feels like he cannot do anything. Was out of work all November.   Review of Systems  Constitutional: Positive for fatigue. Negative for fever, chills, activity change and appetite change.  HENT: Negative for ear pain, facial swelling, sinus pressure, sneezing and sore throat.   Respiratory: Negative for cough, chest tightness, shortness of breath and wheezing.   Cardiovascular: Negative for chest pain, palpitations and leg swelling.  Gastrointestinal: Negative for abdominal pain, diarrhea, constipation and abdominal distention.  Musculoskeletal: Positive for gait problem.  Neurological: Positive for weakness. Negative for dizziness, light-headedness, numbness and headaches.  Psychiatric/Behavioral: Positive for decreased concentration. Negative for dysphoric mood.      Objective:   Physical Exam  Constitutional: He appears well-developed and well-nourished. No distress.  HENT:  Head: Normocephalic and atraumatic.  Mouth/Throat: No oropharyngeal exudate.  Eyes: EOM are normal.  Neck:  Normal range of motion.  Cardiovascular: Normal rate and regular rhythm.   Pulmonary/Chest: Effort normal and breath sounds normal.  Abdominal: Soft. Bowel sounds are normal.  Skin: Skin is warm and dry.  Psychiatric:  Less flat affect than last visit. Casually groomed   Filed Vitals:   09/15/15 0827  BP: 130/84  Pulse: 69  Temp: 98.6 F (37 C)  TempSrc: Oral  Resp: 16  Height: 6\' 2"  (1.88 m)  Weight: 285 lb (129.275 kg)  SpO2: 98%      Assessment & Plan:

## 2015-09-15 NOTE — Assessment & Plan Note (Signed)
Rx for viagra for him to try. No contraindications. Reminded about erections lasting longer than 4 hours he needs to seek medical attention.

## 2015-10-09 ENCOUNTER — Ambulatory Visit: Payer: Self-pay | Admitting: Neurology

## 2015-10-12 ENCOUNTER — Telehealth: Payer: Self-pay | Admitting: Neurology

## 2015-10-12 NOTE — Telephone Encounter (Signed)
VM-Holly with [employer] Disability called and left a message in regards to PT to receive a call back at [phone] [facility]

## 2015-10-13 NOTE — Telephone Encounter (Signed)
Received fax requesting records from 06/2015 to present. Progress note from 08/25/2015 faxed to [phone] with confirmation received.

## 2015-10-19 DIAGNOSIS — Z0279 Encounter for issue of other medical certificate: Secondary | ICD-10-CM

## 2015-11-12 ENCOUNTER — Telehealth: Payer: Self-pay | Admitting: Neurology

## 2015-11-12 DIAGNOSIS — G20 Parkinson's disease: Secondary | ICD-10-CM

## 2015-11-12 NOTE — Telephone Encounter (Signed)
Referral entered  

## 2015-11-12 NOTE — Telephone Encounter (Signed)
-----   Message from Delton Prairie, OT sent at 11/12/2015  1:26 PM EST ----- Regarding: request for referrals Dr. Carles Collet,   [name] [name] is scheduled for OT, PT, ST evaluations 01/05/16 as recommended at discharge.   Please send referrasl via Epic if you agree.  Thanks,  Vianne Bulls, OTR/L 11/12/2015 1:21 PM

## 2015-11-19 ENCOUNTER — Telehealth: Payer: Self-pay | Admitting: *Deleted

## 2015-11-19 DIAGNOSIS — G20 Parkinson's disease: Secondary | ICD-10-CM

## 2015-11-19 NOTE — Telephone Encounter (Signed)
Please call patient about a letter for his employer.  [phone]

## 2015-11-19 NOTE — Telephone Encounter (Signed)
Spoke with patient. We previously wrote a note for patient to work no more than 4 hours a day. He has been working 10 hours a week (4 hours one day, then 3 hours two more days) but he needs a letter stating that he can not work more than 10 hours a week. Okay if I write this? He would like it mailed to him.

## 2015-11-19 NOTE — Telephone Encounter (Signed)
Spoke with patient's wife and discussed how we could not write this because it doesn't make sense from a medical standpoint. She wants to know if we can write for 2 hours a day 5 days a week to equal the 10 hours. Please advise.

## 2015-11-19 NOTE — Telephone Encounter (Signed)
I'm not sure that really makes sense from a PD standpoint, if that is what his disability is for.  Why would med work for 4 hours one day but only 3 the next or vice versa and then none some days?

## 2015-11-20 NOTE — Telephone Encounter (Addendum)
Patient and wife made aware. They state that if they do not get a letter limiting him to 10 hours per week then his social security disability could be cut off. I advised that Dr. Carles Collet has to base her work restrictions on medical reasons alone and can not change his work restrictions further without objective evidence on his capacity to work. Referral sent to [facility] they will call patient with an appt. Phone number also provided for patient to expedite appt with their office if they desire.

## 2015-11-20 NOTE — Telephone Encounter (Signed)
I would need him to do an FCE for this and happy to send him for FCE but I cannot keep changing work regulations without some type of objective evidence

## 2015-11-20 NOTE — Addendum Note (Signed)
Addended byAnnamaria Helling on: 11/20/2015 02:39 PM   Modules accepted: Orders

## 2015-11-30 ENCOUNTER — Encounter: Payer: Self-pay | Admitting: Neurology

## 2015-11-30 ENCOUNTER — Ambulatory Visit (INDEPENDENT_AMBULATORY_CARE_PROVIDER_SITE_OTHER): Payer: Federal, State, Local not specified - PPO | Admitting: Neurology

## 2015-11-30 VITALS — BP 124/62 | HR 72 | Ht 74.0 in | Wt 281.0 lb

## 2015-11-30 DIAGNOSIS — F331 Major depressive disorder, recurrent, moderate: Secondary | ICD-10-CM

## 2015-11-30 DIAGNOSIS — G20 Parkinson's disease: Secondary | ICD-10-CM

## 2015-11-30 DIAGNOSIS — G4733 Obstructive sleep apnea (adult) (pediatric): Secondary | ICD-10-CM | POA: Diagnosis not present

## 2015-11-30 MED ORDER — ROPINIROLE HCL 2 MG PO TABS: ORAL_TABLET | ORAL | Status: DC

## 2015-11-30 NOTE — Progress Notes (Signed)
[name] [name] was seen today in the movement disorders clinic for neurologic consultation at the request of [name], MD.  The consultation is for the evaluation of slowness, flat affect and to r/o a neurologic disorder.  This patient is accompanied in the office by his spouse who supplements the history.  The first symptom(s) the patient noticed was a feeling of weakness that has been going on for about a year.  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  For about a year and a half he has noted a "body" tremor and his wife has noted a hand tremor in both hands (at rest).  He is stiff like he is "a 65 year old man."  Once he is in bed he finds it is hard to move.  The patient has a hx of significant depression and is on multiple medications related to this.  He was started on cogentin just yesterday, but is also on celexa, wellbutrin and latuda.  He has been on [place] for 1.5 months.  Prior to that he was on abilify (tried on 2 separate times but most recently was only on it for a day) and geodon(doesn't remember this but looks like it may have been given in the past).  He has been on risperdal but cannot remember how long ago.  ECT was offered but his wife does not want him to proceed with that  11/20/14 update:  The patient returns today for follow-up.  He is accompanied by his wife who supplements the history.  He has been off of [place] for about 2 weeks.  Unfortunately, he was unable to afford the dat scan.  He is on just Wellbutrin and Celexa for depression and his Cogentin and has been discontinued.  He has noted a decreased appetite and dry mouth and having trouble sleeping because of stiffness.  Pt states that he is almost afraid to go to sleep.  Missing 2 days of work per week (works nights) but a lot of that because of anxiety.  His wife notices increased tremor.  He has slow at work.  Admits to some  lightheadedness but no syncope.  Note diplopia.  No hallucinations.  His wife is doing most of the driving because he is having difficulty staying in his own lane.  02/19/15 update:  The patient is following up today regarding his parkinsonism.  I reviewed records from his psychiatrist since last visit.  His psychiatry note from 12/04/14 indicates that the patient stated that tremor was better.  However, I received a call 4 days later stating that tremor was worse and he thought it was from the Mirapex.  He wanted to change the Mirapex to something else.  He was on Mirapex 0.5 mg 3 times per day at the time.  We ended up switching it to Requip 1 mg 3 times a day.  Fortunately he is doing better.  He denies any side effects with the Requip.  He currently takes it at 8 AM/2 PM/8 PM.  He does state that he notices that he "shuffles with the left leg."  He has not fallen but sometimes feels off balance.  He states that he finished his physical/occupational/speech therapy and feels that it went well.  He is not exercising on his own.  He initially states that he does not feel that he has time, but also states that he does not  go into work until 2 PM and works until 10:30 PM.  This is a change for him.  He was able to change his job at [employer] and seems like that is doing better.  His wife mentions that he continues to have memory loss that she thinks has increased, but she also states that she has noticed memory loss ever since starting on antidepressants.  He had one fall since last visit.  This was when he was taking the trash to the street.  He had no fractures with this, just a superficial scrape.    05/25/15 update:  The patient presents today, accompanied by his wife who supplements the history.  His Requip was increased last visit so that was taking 2 tablets in the morning, one in afternoon and 2 in the evening and 1 day after I started this, I received a call from his wife that the patient did not  tolerate it and it made symptoms worse (which is what they reported with Mirapex).  While that was very odd to me, I told them to go back down to 1 mg 3 times per day.  He actually comes back on 1 mg, 1 in the AM, 2 in the afternoon and 1 in the evening.  He reports that he is doing worse.  He has more tremor and he feels tired in the evening.  Pts wife asks about trying to go back up on the requip slowly.   He had neuropsych testing in June at [facility].  I reviewed that.  There was no evidence of dementia.  There was evidence of mild cognitive impairment from Parkinson's disease as well as from chronic major depressive disorder.  It is recommended that the patient consider transcranial magnetic stimulation for depression, and he told the neuropsychologist that he considered in the past and opted against it.  She encouraged him to attend a Parkinson's support group and he told her that he tried that once and it was not helpful.  He still c/o "my memory has been terrible and especially over the last few weeks."  He feels that he is not depressed - "that is well controlled."  He is not doing CV exercise.  He is still in his new job but it seems more physical than his old job and he thinks he is not as fast in getting tasks done.  He asks about DBS.  08/25/15 update:  The patient is following up today, accompanied by his wife who supplements the history.  I have reviewed records since our last visit.  He is on Requip XL, which was increased to 6 mg at night last visit.  He changed that back to 2 mg tid of the regular ropinirole because of cost.   He is enrolled in physical therapy.  "I'm doing bad."  When asked to describe how he feels bad, he has trouble describing it.  He had a root canal and hasn't felt well since.  He hasn't been back to work for 4 weeks.  He feels that his speech is low and states that he went to voice therapy 2 weeks ago.  He isn't practicing the voice therapy faithfully.  He is  riding the bike on the highest resistance 20 mins a day.  He is having word finding trouble.   No falls but feels that he is "weaker."  He asks me about going to "light duty" at work and possibly retirement in the near future.  He  continues to see psychiatry for his depression.  He last saw psychiatry on 06/04/2015.  No changes were made in medication as he stated that mood was good and continues to reaffirm that.    11/30/15 update:  The patient follows up today, accompanied by his wife who supplements the history.  Last visit, his ropinirole was increased to 4 mg in the morning (8am but goes back to bed for another 1 hour or so), another 2 mg in the afternoon (1-2 pm) and evening (8-8:30pm).  I changed him to light duty at work last visit at his request.  He called me recently and wanted changed from being able to work 4 hours per day to a total of only 10 hours per week.  I told him that he would need a functional capacity evaluation for this, as I was not sure that this made sense from a Parkinson standpoint, given his stage of Parkinson's disease.  He was fairly frustrated with this recommendation, but I told him that I needed some objective evidence.  He reports that he is awaiting an appt for the FCE (has called).   He has been seeing psychiatry and I  actually received a call from his psychiatrist back in December that his psychiatrist felt that he needed to be on levodopa.  I did not think that was the case, but I was willing to do a levodopa challenge to see if that was the case.  He was scheduled to have that done, but the patient canceled that.  Does report that he thinks that depression under good control.   He denies falls.  Some lightheadedness when first gets up, but no near syncope.  Using bike daily for 15-20 minutes.  No hallucinations; rare visual distortions.  Wife thinks that he seems more stiff and has involuntary "tics and shaking" at night.  Waking up with AM headache.  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  Neuroimaging has previously been performed.  It is available for my review today and I reviewed it with him.  There is no BG disease.  ALLERGIES:  No Known Allergies  CURRENT MEDICATIONS:  Outpatient Encounter Prescriptions as of 11/30/2015  Medication Sig  . B Complex-C (B-COMPLEX WITH VITAMIN C) tablet Take 1 tablet by mouth daily.  Marland Kitchen buPROPion (WELLBUTRIN XL) 300 MG 24 hr tablet Take 1 tablet (300 mg total) by mouth every morning.  . citalopram (CELEXA) 40 MG tablet Take 1 tablet (40 mg total) by mouth daily.  . Omega 3-6-9 Fatty Acids (OMEGA 3-6-9 PO) Take by mouth.  Marland Kitchen rOPINIRole (REQUIP) 2 MG tablet Take 2 tablets in the AM, 1 in the afternoon and 1 in the evening  . sildenafil (VIAGRA) 50 MG tablet Take 1 tablet (50 mg total) by mouth daily as needed for erectile dysfunction.  . TURMERIC CURCUMIN PO Take by mouth.  . [DISCONTINUED] rOPINIRole (REQUIP) 1 MG tablet 2 tablets TID   No facility-administered encounter medications on file as of 11/30/2015.    PAST MEDICAL HISTORY:   Past Medical History  Diagnosis Date  . Headache(784.0)   . Cardiac murmur     as a child  . Streptococcal meningitis     as an infant  . Depression   .  OSA (obstructive sleep apnea) 01/17/2011    npsg 2012:  AHI 67/hr. Auto titration 2012:  Optimal pressure 12cm.   Marland Kitchen HEADACHES, HX OF 02/18/2008    Qualifier: Diagnosis  of  By: Danny Lawless CMA, [place]    . APPENDECTOMY, HX OF 02/18/2008    Qualifier: Diagnosis of  By: Danny Lawless CMA, [place]    . Parkinson disease ([place]) 11/2014    PAST SURGICAL HISTORY:   Past Surgical History  Procedure Laterality Date  . Nasal sinus surgery      x 4 as a child  . Vasectomy    . Appendectomy  1967    SOCIAL HISTORY:   Social History   Social History  . Marital Status: Married    Spouse Name: N/A  . Number of Children: Y  . Years of Education: N/A   Occupational History  . 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 Smoking status: Never Smoker   . Smokeless tobacco: Never Used  . Alcohol Use: No  . Drug Use: No  . Sexual Activity: Yes    Birth Control/ Protection: None   Other Topics Concern  . Not on file   Social History Narrative    FAMILY HISTORY:   Family Status  Relation Status Death Age  . Father Deceased     lung cancer, alzheimer's  . Mother Alive     HTN, A fib  . Brother Deceased     accident  . Sister Alive     healthy  . Son Alive     healthy  . Daughter Alive     healthy  . Daughter Alive     healthy  . Maternal Aunt Alive     Parkinson's Disease    ROS:  A complete 10 system review of systems was obtained and was unremarkable apart from what is mentioned above.  PHYSICAL EXAMINATION:    VITALS:   Filed Vitals:   11/30/15 0809  BP: 124/62  Pulse: 72  Height: 6\' 2"  (1.88 m)  Weight: 281 lb (127.461 kg)   Wt Readings from Last 3 Encounters:  11/30/15 281 lb (127.461 kg)  09/15/15 285 lb (129.275 kg)  09/07/15 281 lb 9.6 oz (127.733 kg)     GEN:  The patient appears stated age and is in NAD. HEENT:  Normocephalic, atraumatic.  The mucous membranes are moist. The superficial temporal arteries are without ropiness or tenderness. CV:  RRR Lungs:  CTAB Neck/HEME:  There are no carotid bruits bilaterally.  Neurological examination:  Orientation: The patient is alert and oriented x3. Fund of knowledge is appropriate.  Recent and remote memory are intact.  Attention and concentration are normal.    Able to name objects and repeat phrases. Cranial nerves: There is good facial symmetry. 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  Pupils are equal round and reactive to light bilaterally.  The speech is fluent and clear.  He is hypophonic.  The patient is able to make the gutteral sounds without difficulty. Soft palate rises symmetrically and there is no tongue deviation. Hearing is intact to conversational tone. Sensation: Sensation  is intact to light touch throughout Motor: Strength is 5/5 in the bilateral upper and lower extremities.   Shoulder shrug is equal and symmetric.  There is no pronator drift.   Movement examination: Tone: There is normal tone bilaterally, but there is a degree of gegenhalten/paratonia in the upper extremities bilaterally Abnormal movements: Rare tremor with ambulation in the LUE Coordination:  There is decremation with RAM's, seen with hand opening and closing on the left, finger taps on the left, alternation of supination/pronation on the left, heel  taps and toe taps on the left. Gait and Station: The patient has no difficulty arising out of a deep-seated chair without the use of the hands.   The patient's stride length is  decreased with decreased arm swing on the left and slight hand tremor on the L.    His pull test was negative today.  ASSESSMENT/PLAN:  1.  Parkinsonism  -While he certainly could have idiopathic PD, especially since he was only on the [place] for 6-8 weeks and sx's preceded that, he was exposed to multiple other antipsychotics and I am unsure of the timeline of those exposures.  He has been off of [place] since early February, and his examination had improved when he got off of [place], even before starting on Requip.  Asked him to not spread requip dosages so far apart.    -Move dosages of requip closer together  -send referral for PT/OT  -awaiting FCE 2  Depression  -The patient reports that this has been stable.  He is not suicidal or homicidal.  He will remain on Celexa and Wellbutrin and remain under the care of psychiatry.  -has appt with psychiatry in one week 3.  Mild cognitive impairment   -The patient had neuropsych testing done in June, 2016 and he does not have dementia.  I talked to him about mild cognitive impairment and also told him that the neuropsychologist as well as myself think that depression plays a significant role (pseudodementia).   Continues to  report memory issues and I continue to think that those are from depression.  Talked about importance of routine. 4.  OSAS  -has AM headache.  Is noncompliant with CPAP.  Doesn't want to do split night study but he and I talked about morbidity and mortality of untreated OSAS.   5.  Follow-up with me will be in the next 4 months, sooner should new neurologic issues arise.      Much greater than 50% of this visit was spent in counseling with the patient and the family.  Total face to face time:  25 min

## 2015-11-30 NOTE — Patient Instructions (Signed)
1. You have been referred to [facility] for physical and occupational. They will call you directly to schedule an appointment.  Please call [phone] if you do not hear from them.

## 2015-12-07 ENCOUNTER — Encounter (HOSPITAL_COMMUNITY): Payer: Self-pay | Admitting: Psychiatry

## 2015-12-07 ENCOUNTER — Ambulatory Visit (INDEPENDENT_AMBULATORY_CARE_PROVIDER_SITE_OTHER): Payer: Federal, State, Local not specified - PPO | Admitting: Psychiatry

## 2015-12-07 VITALS — BP 127/82 | HR 69 | Ht 74.0 in | Wt 279.2 lb

## 2015-12-07 DIAGNOSIS — F332 Major depressive disorder, recurrent severe without psychotic features: Secondary | ICD-10-CM | POA: Diagnosis not present

## 2015-12-07 DIAGNOSIS — G3184 Mild cognitive impairment, so stated: Secondary | ICD-10-CM | POA: Diagnosis not present

## 2015-12-07 MED ORDER — CITALOPRAM HYDROBROMIDE 40 MG PO TABS: 40.0000 mg | ORAL_TABLET | Freq: Every day | ORAL | Status: DC

## 2015-12-07 MED ORDER — BUPROPION HCL ER (XL) 300 MG PO TB24: 300.0000 mg | ORAL_TABLET | Freq: Every morning | ORAL | Status: DC

## 2015-12-07 NOTE — Progress Notes (Signed)
[facility] Progress Note  [name] [name] [id number] 65 y.o.  12/07/2015 10:46 AM  Chief Complaint:  Medication management and follow-up.              History of Present Illness:  [name] [name] came for his follow-up appointment with his wife.  He is doing much better with his medication.  His wife also endorse that his depression is much better .  He sleeping good.  He continues to work at [employer] 10 hours a week.  He recently seen his neurologist for the management off his Parkinson disease.  Sometime he complaining of nausea and dizziness which she believed due to medication but otherwise he is tolerating his medication without any problem.  His appetite is okay.  Denies any irritability, anger, paranoia or any hallucination.  His thinking remains slow but his answers are appropriate.  He believed Requip is helping his parkinsonian symptoms.  Patient is supposed to get levodopa challenge test but he canceled the test.  Like to continue Celexa and Wellbutrin.  He denies any crying spells.  He likes working 10 hours a week .  His wife also endorse much improvement in his affect and energy level.  His appetite is okay.  His vitals are stable.  Suicidal Ideation: No Plan Formed: No Patient has means to carry out plan: No  Homicidal Ideation: No Plan Formed: No Patient has means to carry out plan: No  Review of Systems  Constitutional: Negative for weight loss.  HENT: Negative.   Cardiovascular: Negative for chest pain and palpitations.  Musculoskeletal: Negative for myalgias and back pain.  Skin: Negative for itching and rash.  Neurological: Positive for tremors. Negative for dizziness and headaches.  Psychiatric/Behavioral: Negative for depression, suicidal ideas, hallucinations and substance abuse. The patient does not have insomnia.    Psychiatric: Agitation: No Hallucination: No Depressed Mood: No Insomnia: No Hypersomnia: No Altered Concentration: No Feels  [place]: No Grandiose Ideas: No Belief In Special Powers: No New/Increased Substance Abuse: No Compulsions: No  Neurologic: Headache: No Seizure: No Paresthesias: No  Past Medical History: Hypogonadism, Parkinson  Social history. Patient is employed and married.  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  Outpatient Encounter Prescriptions as of 12/07/2015  Medication Sig  . B Complex-C (B-COMPLEX WITH VITAMIN C) tablet Take 1 tablet by mouth daily.  Marland Kitchen buPROPion (WELLBUTRIN XL) 300 MG 24 hr tablet Take 1 tablet (300 mg total) by mouth every morning.  . citalopram (CELEXA) 40 MG tablet Take 1 tablet (40 mg total) by mouth daily.  . Omega 3-6-9 Fatty Acids (OMEGA 3-6-9 PO) Take by mouth.  Marland Kitchen rOPINIRole (REQUIP) 2 MG tablet Take 2 tablets in the AM, 1 in the afternoon and 1 in the evening  . sildenafil (VIAGRA) 50 MG tablet Take 1 tablet (50 mg total) by mouth daily as needed for erectile dysfunction.  . TURMERIC CURCUMIN PO Take by mouth.  . [DISCONTINUED] buPROPion (WELLBUTRIN XL) 300 MG 24 hr tablet Take 1 tablet (300 mg total) by mouth every morning.  . [DISCONTINUED] citalopram (CELEXA) 40 MG tablet Take 1 tablet (40 mg total) by mouth daily.   No facility-administered encounter medications on file as of 12/07/2015.    Past Psychiatric History/Hospitalization(s): Patient denies any history of suicidal attempt or any inpatient psychiatric treatment.  He denies any history of mania, psychosis, paranoia or any hallucination.  In the past he had tried Effexor, Abilify, Paxil, Cymbalta, Zoloft, amitriptyline, Pristiq,  Britnellex, Celexa, Deplin and Vyvanse.  He is seen in this office since September 2009.  He has seen multiple psychiatrists in the past.  Anxiety: No Bipolar Disorder: No Depression: Yes Mania: No Psychosis: No Schizophrenia: No Personality Disorder: No Hospitalization for psychiatric illness: No History of Electroconvulsive Shock Therapy:  No Prior Suicide Attempts: No  Physical Exam: Constitutional:  BP 127/82 mmHg  Pulse 69  Ht 6\' 2"  (1.88 m)  Wt 279 lb 3.2 oz (126.644 kg)  BMI 35.83 kg/m2  No results found for this or any previous visit (from the past 2160 hour(s)).  General Appearance: alert, oriented, no acute distress  Musculoskeletal: Strength & Muscle Tone: decreased Gait & Station: normal Patient leans: N/A   Mental Status Examination: Patient is casually dressed and fairly groomed.  He maintained fair eye contact.  His speech is slow with decreased volume and tone.  His response to the question is slow.  His thought process is slow but coherent.  His affect is flat.  He denies any auditory or visual hallucination.  He denies any active or passive suicidal thoughts and homicidal thoughts.  There were no delusions or any paranoia.  His attention and concentration is fair. He described his mood euthymic and his affect is flat.  He has some difficulty remembering things but he is alert and oriented 3.  His psychomotor activity is decreased.  His fund of knowledge is average.  His insight judgment and impulse control is okay.  Established Problem, Stable/Improving (1), Review of Last Therapy Session (1) and Review of Medication Regimen & Side Effects (2)  Assessment: Axis I: Maj. depressive disorder, recurrent, severe.  Mild cognitive impairment.   Axis II: Deferred  Axis III: Hypogonadism, Parkinson   Plan:  Patient is doing better on Celexa 40 mg daily and Wellbutrin XL 300 mg daily.  I recommended to try Celexa in the nighttime if he believed it is causing nausea in the morning.  We also discussed postural hypotension and suggested to take some time when he changes his posture to avoid dizziness.  Discussed medication side effects and benefits.  Recommended to call us back if he has any question or any concern.  Patient will need a letter stating that he needs 10 hours a week .  We will provide letter .  I  will see him again in 3 months.  Discuss safety plan that anytime having active suicidal thoughts or homicidal thought any need to call 911 or go to the local emergency room.   Abby Stines T., MD 12/07/2015

## 2015-12-16 ENCOUNTER — Ambulatory Visit: Payer: Federal, State, Local not specified - PPO | Admitting: Occupational Therapy

## 2015-12-16 ENCOUNTER — Ambulatory Visit: Payer: Federal, State, Local not specified - PPO | Attending: Neurology | Admitting: Physical Therapy

## 2015-12-16 ENCOUNTER — Ambulatory Visit: Payer: Federal, State, Local not specified - PPO | Admitting: Speech Pathology

## 2015-12-16 DIAGNOSIS — R471 Dysarthria and anarthria: Secondary | ICD-10-CM | POA: Diagnosis present

## 2015-12-16 DIAGNOSIS — R269 Unspecified abnormalities of gait and mobility: Secondary | ICD-10-CM | POA: Insufficient documentation

## 2015-12-16 DIAGNOSIS — R1310 Dysphagia, unspecified: Secondary | ICD-10-CM | POA: Insufficient documentation

## 2015-12-16 DIAGNOSIS — R278 Other lack of coordination: Secondary | ICD-10-CM

## 2015-12-16 DIAGNOSIS — R4189 Other symptoms and signs involving cognitive functions and awareness: Secondary | ICD-10-CM

## 2015-12-16 DIAGNOSIS — R258 Other abnormal involuntary movements: Secondary | ICD-10-CM | POA: Diagnosis present

## 2015-12-16 DIAGNOSIS — R29898 Other symptoms and signs involving the musculoskeletal system: Secondary | ICD-10-CM | POA: Diagnosis present

## 2015-12-16 DIAGNOSIS — R2681 Unsteadiness on feet: Secondary | ICD-10-CM | POA: Diagnosis present

## 2015-12-16 DIAGNOSIS — R4701 Aphasia: Secondary | ICD-10-CM | POA: Diagnosis present

## 2015-12-16 DIAGNOSIS — R279 Unspecified lack of coordination: Secondary | ICD-10-CM | POA: Diagnosis present

## 2015-12-16 DIAGNOSIS — Z7409 Other reduced mobility: Secondary | ICD-10-CM | POA: Diagnosis present

## 2015-12-16 DIAGNOSIS — R293 Abnormal posture: Secondary | ICD-10-CM | POA: Diagnosis present

## 2015-12-16 NOTE — Therapy (Signed)
[facility] [address] [place], [place], [address] Phone: [phone]   Fax:  [phone]  Speech Language Pathology Evaluation  Patient Details  Name: [name] [name] MRN: [id number] Date of Birth: [birth year] Referring Provider: Dr. [name] [name]  Encounter Date: 12/16/2015      End of Session - 12/16/15 1115    Visit Number 1   Number of Visits 17   Date for SLP Re-Evaluation 02/10/16   SLP Start Time [id number]   SLP Stop Time  1058   SLP Time Calculation (min) 43 min   Activity Tolerance Patient tolerated treatment well      Past Medical History  Diagnosis Date  . Headache(784.0)   . Cardiac murmur     as a child  . Streptococcal meningitis     as an infant  . Depression   .  OSA (obstructive sleep apnea) 01/17/2011    npsg 2012:  AHI 67/hr. Auto titration 2012:  Optimal pressure 12cm.   Marland Kitchen HEADACHES, HX OF 02/18/2008    Qualifier: Diagnosis of  By: [name] [name], [name] [name]    . APPENDECTOMY, HX OF 02/18/2008    Qualifier: Diagnosis of  By: [name] [name], [name] [name]    . Parkinson disease ([place]) 11/2014    Past Surgical History  Procedure Laterality Date  . Nasal sinus surgery      x 4 as a child  . Vasectomy    . Appendectomy  1967    There were no vitals filed for this visit.  Visit Diagnosis: Dysarthria - Plan: SLP plan of care cert/re-cert          SLP Evaluation [facility] - 12/16/15 1023    SLP Visit Information   SLP Received On 12/16/15   Referring Provider Dr. [name] [name]   Onset Date approx 1 year ago   Medical Diagnosis Parkinson's Disease   Subjective   Subjective "I just feel weak all over"   Patient/Family Stated Goal "To be louder"   Pain Assessment   Currently in Pain? No/denies   General Information   Behavioral/Cognition bradyphrenia   Mobility Status walks independenlty - PT eval today   Prior Functional Status   Cognitive/Linguistic Baseline Baseline deficits   Baseline deficit  details memory loss   Type of Home House    Lives With Spouse;Family   Vocation Full time employment   Pain Assessment   Pain Assessment No/denies pain   Oral Motor/Sensory Function   Overall Oral Motor/Sensory Function Appears within functional limits for tasks assessed   Motor Speech   Overall Motor Speech Impaired   Respiration Impaired   Level of Impairment Sentence   Phonation Low vocal intensity;Hoarse   Resonance Within functional limits   Intelligibility Intelligibility reduced   Word 75-100% accurate   Phrase 50-74% accurate   Sentence 50-74% accurate   Conversation 50-74% accurate   Plan   Duration Other (comment)  6 weeks                      ADULT SLP TREATMENT - 12/16/15 1023    Cognitive-Linquistic Treatment   Treatment focused on Dysarthria   Skilled Treatment Initiated training for loud /a/ with occasional min A. Pt reports he has not been performing loud /a/ regularly at home. He required initial instruction and modeling for breath support. Facilitated loud volume  for structured speech tasks with occasional min to mod A, cues to think shout and to feel like he is talking  too loud. Pt averaged 70dB on tasks with mod A.            SLP Education - 12/16/15 1114    Education provided Yes   Education Details Breath support for volume, being aware of swallowing more frequently to manage saliva, loud /a/, compensations for dysarthria   Person(s) Educated Patient   Methods Explanation;Handout   Comprehension Verbalized understanding;Verbal cues required          SLP Short Term Goals - 12/16/15 1118    SLP SHORT TERM GOAL #1   Title Pt will average loud /a/ of 88dB over 3 sessions   Time 4   Period Weeks   Status New   SLP SHORT TERM GOAL #2   Title Pt will average 70dB during structured speech tasks and simple conversation with occasional min A   Time 4   Period Weeks   Status New   SLP SHORT TERM GOAL #3   Title Pt will utlize  compensations to reduce coughing on his saliva while at rest, reporting coughing on saliva reduced by 25% subjectively.   Time 4   Period Weeks   Status New          SLP Long Term Goals - 12/16/15 1121    SLP LONG TERM GOAL #1   Title pt to maintain average 70dB in 15 minutes mod complex conversation outside [place] room over two sessions   Time 8   Period Weeks   Status New   SLP LONG TERM GOAL #2   Title Pt will maintain an average of 70dBfor modcomplex conversation over 12 minutes with rare minimal assistance over 2 sessions    Time 8   Period Weeks   Status New          Plan - 12/16/15 1116    Clinical Impression Statement Mr. [name] [name] is a 65 y.o. male who is known to [place]. 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 Today he presents with mild to moderate dysarthria/dysphonia. He reports minimal difficulty speaking over the phone. Mr. [name] [name] reports his wife asks him to repeat himself occassionally during the day.  10 minutes of conversational speech was reduced today, at average 67dB (WNL= average 70-72dB) with range of 66 to 69dB, when a sound level meter was placed 30 cm away from pt's mouth. Overall intelligibility for this listener in a quiet environment was approx 90%. Production of loud /a/ averaged 90dB (range of 84 to 93dB) and mod cues usually needed for loudness.   Oral motor assessment revealed WFL lingual ROM and WFL lingual strength. Labial ROM was [facility] and strength was [facility]. Velar ROM appeared [facility]   Pt rated effort level at 8/10 for production of loud /a/ (10=maximal effort). In oral reading and simple description tasks, pt was asked to use the same amount of effort as with loud /a/. Loudness average with this increased effort was 70dB (range of 68 to 72) with mod A occasionallyfor loudness. Pt would benefit from skilled ST in order to improve speech intelligibility and pt's QOL.  Pt denies difficulty swallowing pills and meals, however  reports usual coughing on saliva that pools in his throat while at rest. Educated pt to increase awareness of need to swallow more frequently and to use hard swallow. He denies significant aphasia. Mr,. Zirkelbach named 12 items for simple category in 1 min which is [facility]. He named 9 items for mildly complex category, low average on cursory aphasia assessment.  Speech Therapy Frequency 2x / week   Duration --  8 weeks   Treatment/Interventions SLP instruction and feedback;Internal/external aids;Patient/family education;Functional tasks;Compensatory strategies;Aspiration precaution training   Potential to Achieve Goals Good   Potential Considerations Severity of impairments   Consulted and Agree with Plan of Care Patient          G-Codes - 12-24-15 1122    Functional Limitations Motor speech   Motor Speech Current Status [id number]) At least 20 percent but less than 40 percent impaired, limited or restricted   Motor Speech Goal Status [id number]) At least 1 percent but less than 20 percent impaired, limited or restricted      Problem List Patient Active Problem List   Diagnosis Date Noted  . Parkinsonian features 09/15/2015  . ED (erectile dysfunction) 09/15/2015  . MDD (major depressive disorder), recurrent episode, severe ([place]) 02/10/2014  . Nonspecific abnormal electrocardiogram (ECG) (EKG) 02/07/2014  . Non-compliant behavior 02/03/2014  . Morton's neuroma of left foot 08/07/2013  . 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 Hyperglycemia 01/26/2012  . Hypogonadism male 01/24/2012  . Syncope and collapse 01/20/2012  .  OSA (obstructive sleep apnea) 01/17/2011  . CHEST TIGHTNESS 02/18/2008     Yanel Dombrosky, Annye Rusk MS, CCC-SLP 12/24/15, 11:28 AM  [facility] [address] [place], [place], [address] Phone: [phone]   Fax:  [phone]  Name: [name] [name] MRN: [id number] Date of  Birth: [birth year]

## 2015-12-16 NOTE — Patient Instructions (Signed)
  Big Breath - Loud "AH!!" 5x twice a day  Feel like you are talking too loud   Read aloud with big breath feeling like you are shouting. Breathe at periods or commas  Be aware of swallowing more frequently - swallow hard

## 2015-12-16 NOTE — Therapy (Signed)
[facility] [address] [place], [place], [address] Phone: [phone]   Fax:  [phone]  Occupational Therapy Evaluation  Patient Details  Name: [name] [name] MRN: [id number] Date of Birth: 09/12/1951 Referring Provider: Dr. Carles Collet  Encounter Date: 12/16/2015      OT End of Session - 12/16/15 1332    Visit Number 1   Number of Visits 17   Date for OT Re-Evaluation 02/13/16   Authorization Type [id number] visit limit   Authorization - Visit Number --   Authorization - Number of Visits --   OT Start Time 1105   OT Stop Time 1145   OT Time Calculation (min) 40 min      Past Medical History  Diagnosis Date  . Headache(784.0)   . Cardiac murmur     as a child  . Streptococcal meningitis     as an infant  . Depression   .  OSA (obstructive sleep apnea) 01/17/2011    npsg 2012:  AHI 67/hr. Auto titration 2012:  Optimal pressure 12cm.   Marland Kitchen HEADACHES, HX OF 02/18/2008    Qualifier: Diagnosis of  By: Danny Lawless CMA, [place]    . APPENDECTOMY, HX OF 02/18/2008    Qualifier: Diagnosis of  By: Danny Lawless CMA, [place]    . Parkinson disease ([place]) 11/2014    Past Surgical History  Procedure Laterality Date  . Nasal sinus surgery      x 4 as a child  . Vasectomy    . Appendectomy  1967    There were no vitals filed for this visit.  Visit Diagnosis:  Bradykinesia - Plan: Ot plan of care cert/re-cert  Rigidity - Plan: Ot plan of care cert/re-cert  Posture abnormality - Plan: Ot plan of care cert/re-cert  Decreased coordination - Plan: Ot plan of care cert/re-cert  Cognitive deficits - Plan: Ot plan of care cert/re-cert  Abnormal posture - Plan: Ot plan of care cert/re-cert  Unsteadiness - Plan: Ot plan of care cert/re-cert      Subjective Assessment - 12/16/15 1107    Subjective  Pt with PD reports increased stiffness and weakness which impacts ADLs   Pertinent History dx PD 11/2014, hx of depression, mild  cognitive impairment per recent neuropsych cognitive testing   Patient Stated Goals Pt reports increased weakness   Currently in Pain? No/denies           [facility] OT Assessment - 12/16/15 0001    Assessment   Diagnosis Parkinsonism   Referring Provider Dr. Carles Collet   Onset Date 11/30/15   Assessment Pt reports overall increased weakness and ADLS take longer.   Prior Therapy OT/PT/ST   Precautions   Precautions Fall   Precaution Comments freezing episodes at times yet improved per pt. reports   Balance Screen   Has the patient fallen in the past 6 months No   Home  Environment   Family/patient expects to be discharged to: Private residence   Lives With Spouse   Prior Function   Level of Independence Independent with basic ADLs;Independent with household mobility with device   Vocation Part time employment   Vocation Requirements post office   Leisure listening to music/ watch TV   ADL   Eating/Feeding Modified independent   Grooming Independent   Upper Body Bathing Modified independent   Lower Body Bathing Modified independent   Upper Body Dressing Increased time;Independent   Lower Body Dressing Increased time;Modified independent   Toileting - Clothing Manipulation Modified independent  Toileting -  Hygiene Modified Independent   ADL comments Pt reports he requires increased time for all ADLs., Pt reports difficulty with bed mobility Pt is still driving, therapist recommends pt does not drive for long time periods without rest breaks   IADL   Light Housekeeping Performs light daily tasks such as dishwashing, bed making   Prior Level of Function Meal Prep wife performs   Prior Level of Function Community Mobility independent   Medication Management Is responsible for taking medication in correct dosages at correct time;Has difficulty remembering to take medication  reports    Prior Level of Function Financial Management Pt reports that he/wife do together, but pt used to write  checks and doesn't write them any longer   Mobility   Mobility Status Independent;Freezing   Written Expression   Dominant Hand Right   Handwriting 100% legible;Mild micrographia   Vision - History   Baseline Vision Wears glasses all the time   Patient Visual Report Other (comment)  Pt reports increased blurry vision   Vision Assessment   Vision Assessment Vision not tested   Activity Tolerance   Activity Tolerance Comments Pt reports increased fatigue, daily naps   Cognition   Overall Cognitive Status Impaired/Different from baseline   Area of Impairment Memory;Attention   Attention Comments difficulty with alternating/ divided attention   Memory Decreased short-term memory   Memory Comments Pt reports forgetting to turn off light switch   Observation/Other Assessments   Standing Functional Reach Test R- 12", L 11"   Simulated Eating Time (seconds) 14.22 secs   Donning Doffing Jacket Time (seconds) 40.72 secs   Coordination   Right 9 Hole Peg Test 26.97 secs   Left 9 Hole Peg Test 30.13 secs   Box and Blocks R- 56 blocks, L-55 blocks    Tremors pt reports tremors in both hands   AROM   Overall AROM  Within functional limits for tasks performed                           OT Short Term Goals - 12/16/15 1339    OT SHORT TERM GOAL #1   Title Pt will be independent with updated HEP    Period Weeks   Status New   OT SHORT TERM GOAL #2   Title Pt will demonstrate improved ease with feeding as evidenced by performing PPT#2  in 11 secs or less.   Baseline 14.22   Time 4   Period Weeks   Status New   OT SHORT TERM GOAL #3   Title Pt will verbalize understanding of cognitive compensation strategies/ways to promote cognitive skills prn.   Time 4   Period Weeks   Status New   OT SHORT TERM GOAL #4   Title Test 3 button/ unbutton and set goal as appropriate   Time 4   Period Weeks   Status New   OT SHORT TERM GOAL #5   Title Pt will demonstrate increased  ease with dressing as evidenced by decreasing PPT#4  by 5 secs.   Time 4   Period Weeks   Status New           OT Long Term Goals - 12/16/15 1349    OT LONG TERM GOAL #1   Title Pt will verbalize understanding of updated AE/strategies for ADLs/IADLs prn    Time 8   Status New   OT LONG TERM GOAL #2   Title Pt  will improve dressing ability as shown by improving time on PPT#4 by at least 10 sec.   Baseline 40.72 secs   Time 8   Period Weeks   Status New   OT LONG TERM GOAL #3   Title Pt will demonstrate ability to write a paragraph with 100% legibility and no significant decrease in letter size.   Time 8   Period Weeks   Status New   OT LONG TERM GOAL #4   Title Pt will verbalize understanding of ways to prevent future PD-related complications and verbalize understanding of community resources.   Time 8   Period Weeks   Status New            Plan - 12/16/15 1335    Clinical Impression Statement Pt with Parkinson's disease and a history of depression presents to occupational therapy with bradykinesia, tremor, decreased coordination, cognitive deficits, abnormal posture which impdes preformance of ADLS/IADLS. Pt can benefiit from skilled occupational therapy.   Pt will benefit from skilled therapeutic intervention in order to improve on the following deficits (Retired) Decreased coordination;Decreased endurance;Decreased activity tolerance;Impaired tone;Impaired UE functional use;Decreased knowledge of use of DME;Decreased balance;Decreased cognition;Decreased mobility   Rehab Potential Good   OT Frequency 2x / week   OT Duration 8 weeks   OT Treatment/Interventions Self-care/ADL training;Moist Heat;DME and/or AE instruction;Splinting;Patient/family education;Balance training;Therapeutic exercises;Therapeutic exercise;Therapeutic activities;Cognitive remediation/compensation;Passive range of motion;Functional Mobility Training;Neuromuscular education;Cryotherapy;Electrical  Stimulation;Energy conservation;Manual Therapy   Plan test 3 button/ unbutton-set goal PRN, initiate HEP   Consulted and Agree with Plan of Care Patient        Problem List Patient Active Problem List   Diagnosis Date Noted  . Parkinsonian features 09/15/2015  . ED (erectile dysfunction) 09/15/2015  . MDD (major depressive disorder), recurrent episode, severe ([place]) 02/10/2014  . Nonspecific abnormal electrocardiogram (ECG) (EKG) 02/07/2014  . Non-compliant behavior 02/03/2014  . Morton's neuroma of left foot 08/07/2013  . 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 Hyperglycemia 01/26/2012  . Hypogonadism male 01/24/2012  . Syncope and collapse 01/20/2012  .  OSA (obstructive sleep apnea) 01/17/2011  . CHEST TIGHTNESS 02/18/2008    Gregoire Bennis 12/16/2015, 5:30 PM Theone Murdoch, OTR/L [phone] Phone: [phone] 5:30 PM 12/16/2015 [facility] [address] [place], [place], [address] Phone: [phone]   Fax:  [phone]  Name: [name] [name] MRN: [id number] Date of Birth: 12/25/1950

## 2015-12-17 DIAGNOSIS — R269 Unspecified abnormalities of gait and mobility: Secondary | ICD-10-CM | POA: Diagnosis not present

## 2015-12-17 NOTE — Therapy (Signed)
[facility] [address] [place], [place], [address] Phone: [phone]   Fax:  [phone]  Physical Therapy Evaluation  Patient Details  Name: [name] [name] MRN: [id number] Date of Birth: [birth year] Referring Provider: [name] Tat  Encounter Date: 12/16/2015      PT End of Session - 12/17/15 1417    Visit Number 1  New episode of care   Number of Visits 17   Date for PT Re-Evaluation 08/24/15   Authorization Type BCBS FED  75 visit limit combined   PT Start Time 1150   PT Stop Time 1232   PT Time Calculation (min) 42 min   Activity Tolerance Patient tolerated treatment well   Behavior During Therapy [facility] for tasks assessed/performed      Past Medical History  Diagnosis Date  . Headache(784.0)   . Cardiac murmur     as a child  . Streptococcal meningitis     as an infant  . Depression   .  OSA (obstructive sleep apnea) 01/17/2011    npsg 2012:  AHI 67/hr. Auto titration 2012:  Optimal pressure 12cm.   Marland Kitchen HEADACHES, HX OF 02/18/2008    Qualifier: Diagnosis of  By: Danny Lawless CMA, [place]    . APPENDECTOMY, HX OF 02/18/2008    Qualifier: Diagnosis of  By: Danny Lawless CMA, [place]    . Parkinson disease ([place]) 11/2014    Past Surgical History  Procedure Laterality Date  . Nasal sinus surgery      x 4 as a child  . Vasectomy    . Appendectomy  1967    There were no vitals filed for this visit.  Visit Diagnosis:  Abnormality of gait  Bradykinesia  Rigidity  Abnormal posture  Postural instability      Subjective Assessment - 12/16/15 1151    Subjective Pt reports overall feeling of increased weakness, stiffness upon waking up.  Reports no falls.  Less frequent freezing episodes, but increased tremors.   Patient Stated Goals Pt's goal for therapy is to address the shuffling with walking.   Currently in Pain? No/denies            [facility] PT Assessment - 12/16/15 1156    Assessment   Medical  Diagnosis Parkinson's disease   Referring Provider [name] Tat   Precautions   Precautions Fall   Balance Screen   Has the patient fallen in the past 6 months No   Has the patient had a decrease in activity level because of a fear of falling?  No   Is the patient reluctant to leave their home because of a fear of falling?  No   Home Environment   Living Environment Private residence   Living Arrangements Spouse/significant other   Available Help at Discharge Family   Type of [place] to enter   Entrance Stairs-Number of Steps 5   Entrance Stairs-Rails Right   [place] Two level;Bed/bath upstairs   Additional Comments Pt describes difficulty with ascending steps-takes extra effort   Prior Function   Level of Independence Independent with household mobility without device;Independent with basic ADLs  Takes extra effort   Posture/Postural Control   Posture/Postural Control Postural limitations   Postural Limitations Forward head;Rounded Shoulders   ROM / Strength   AROM / PROM / Strength AROM;Strength;PROM   AROM   Overall AROM  Within functional limits for tasks performed   PROM   Overall PROM Comments increased stiffness with  P/ROM   Strength   Overall Strength Comments grossly tested at least 4/5    Bed Mobility   Bed Mobility --  Reports incr. difficulty scooting, rolling/turning   Transfers   Transfers Sit to Stand;Stand to Sit   Sit to Stand 6: Modified independent (Device/Increase time);Without upper extremity assist;From chair/3-in-1   Five time sit to stand comments  17.81   1 episode of posterior push on back of legs at chair   Stand to Sit 6: Modified independent (Device/Increase time);Without upper extremity assist;To chair/3-in-1   Comments 5x sit<>stand 14.5 sec at discharge   Ambulation/Gait   Ambulation/Gait Yes   Ambulation/Gait Assistance 7: Independent   Assistive device None   Gait Pattern Decreased arm swing - right;Decreased  arm swing - left;Step-through pattern;Decreased step length - right;Decreased step length - left;Narrow base of support;Trunk flexed;Decreased trunk rotation   Ambulation Surface Level;Indoor   Gait velocity 11.92 sec = 2.75 ft/sec   Timed Up and Go Test   Normal TUG (seconds) 13.3   Manual TUG (seconds) 13.05   Cognitive TUG (seconds) 13.62   High Level Balance   High Level Balance Comments Single limb stance:  2.69 sec on L; 2.15 seconds on R.  Posterior push and release test-pt able to regain balance without taking step.   Functional Gait  Assessment   Gait assessed  Yes   Gait Level Surface Walks 20 ft in less than 7 sec but greater than 5.5 sec, uses assistive device, slower speed, mild gait deviations, or deviates 6-10 in outside of the 12 in walkway width.  9.18   Change in Gait Speed Able to change speed, demonstrates mild gait deviations, deviates 6-10 in outside of the 12 in walkway width, or no gait deviations, unable to achieve a major change in velocity, or uses a change in velocity, or uses an assistive device.   Gait with Horizontal Head Turns Performs head turns smoothly with slight change in gait velocity (eg, minor disruption to smooth gait path), deviates 6-10 in outside 12 in walkway width, or uses an assistive device.   Gait with Vertical Head Turns Performs task with slight change in gait velocity (eg, minor disruption to smooth gait path), deviates 6 - 10 in outside 12 in walkway width or uses assistive device   Gait and Pivot Turn Pivot turns safely within 3 sec and stops quickly with no loss of balance.   Step Over Obstacle Is able to step over one shoe box (4.5 in total height) but must slow down and adjust steps to clear box safely. May require verbal cueing.   Gait with Narrow Base of Support Ambulates less than 4 steps heel to toe or cannot perform without assistance.   Gait with Eyes Closed Cannot walk 20 ft without assistance, severe gait deviations or imbalance,  deviates greater than 15 in outside 12 in walkway width or will not attempt task.   Ambulating Backwards Walks 20 ft, slow speed, abnormal gait pattern, evidence for imbalance, deviates 10-15 in outside 12 in walkway width.   Steps Alternating feet, must use rail.  descending steps-foot catches previous step   Total Score 15   FGA comment: Scores <22/30 indicate increased fall risk (pt has decreased FGA score since dischage 08/10/15.)                             PT Short Term Goals - 12/17/15 1657    PT SHORT  TERM GOAL #1   Title Pt will be independent with HEP for improved bed mobility, transfers, balance and gait.  TARGET 01/15/16   Time 4   Period Weeks   Status New   PT SHORT TERM GOAL #2   Title Pt will improve 5x sit<>stand to less than or equal 14 seconds for improved transfer efficiency and safety.   Time 4   Period Weeks   Status New   PT SHORT TERM GOAL #3   Title Pt will improve single limb stance to at least 4 seconds bilateral lower extremities for improved stair and obstacle negotiation.   Time 4   Period Weeks   Status New   PT SHORT TERM GOAL #4   Title 3 minute walk test to be completed, with pt able to ambulate at least 50 ft greater than baseline, for improved endurance for long distance gait for work.   Time 4   Period Weeks   Status New   PT SHORT TERM GOAL #5   Title Pt will verbalize at least 25% improvement in bed mobility.   Time 4   Period Weeks   Status New           PT Long Term Goals - 12/17/15 1705    PT LONG TERM GOAL #1   Title Pt will verbalize understanding of fall prevention/tips to reduce freezing with gait.  TARGET 02/15/16   Time 8   Period Weeks   Status New   PT LONG TERM GOAL #2   Title Pt will perform at least 8 of 10 reps of sit<>stand from 18 inch surfaces or lower, with minimal to no UE support for improved transfer efficiency and safety.   Time 8   Period Weeks   Status New   PT LONG TERM GOAL #3    Title Pt will improve Functional Gait Assessment to at least 19/30 for decreased fall risk.   Time 8   Period Weeks   PT LONG TERM GOAL #4   Title Pt will negotiate at least 4 steps with one handrail, step through pattern, modified independently without loss of balance.   Time 8   Period Weeks   Status New   PT LONG TERM GOAL #5   Title Pt will verbalize understanding of plans for community fitness upon D/C from PT.   Time 8   Period Weeks   Status New               Plan - 12/17/15 1648    Clinical Impression Statement Pt is a 65 year old male who presents to OP PT with history of Parkinson's disease, who returns to OP PT with reports of overall decreased flexibility when trying to put on clothes, increased shuffling with giat, increased difficulty with bed mobility and decreased confidence with stair negotiation.  Pt presents with bradykinesia, rigidity, decreased functional strength, slowed transfers, decreased timing and coordination of gait, decreased balance, decreased single limb stance.  Pt presents with at least 3 co-morbidities.  Pt's condition is evolving due to pt's decline since last bout of therapy.  Pt would benefit from skilled PT to address the above stated deficits to improve functional mobility and decrease fall risk.   Pt will benefit from skilled therapeutic intervention in order to improve on the following deficits Abnormal gait;Decreased balance;Decreased mobility;Decreased strength;Difficulty walking;Impaired flexibility;Postural dysfunction   Rehab Potential Good   PT Frequency 2x / week   PT Duration 8 weeks  plus eval  PT Treatment/Interventions ADLs/Self Care Home Management;Therapeutic exercise;Therapeutic activities;Functional mobility training;Gait training;Balance training;Neuromuscular re-education   PT Next Visit Plan Review/revisit HEP-initiate HEP for PWR! standing Moves; work on single limb stance (check MMT of hip), stair negotiation, bed mobility           G-Codes - 12-20-2015 1710    Functional Assessment Tool Used gait velocity 2.69 ft/sec; Functional Gait Assessment 15/30; SLS R 2.15 sec, SLS L 2.69 sec, 5x sit<>stand 17.81 sec   Functional Limitation Mobility: Walking and moving around   Mobility: Walking and Moving Around Current Status [phone]) At least 20 percent but less than 40 percent impaired, limited or restricted   Mobility: Walking and Moving Around Goal Status [phone]) At least 1 percent but less than 20 percent impaired, limited or restricted       Problem List Patient Active Problem List   Diagnosis Date Noted  . Parkinsonian features 09/15/2015  . ED (erectile dysfunction) 09/15/2015  . MDD (major depressive disorder), recurrent episode, severe ([place]) 02/10/2014  . Nonspecific abnormal electrocardiogram (ECG) (EKG) 02/07/2014  . Non-compliant behavior 02/03/2014  . Morton's neuroma of left foot 08/07/2013  . 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 Hyperglycemia 01/26/2012  . Hypogonadism male 01/24/2012  . Syncope and collapse 01/20/2012  .  OSA (obstructive sleep apnea) 01/17/2011  . CHEST TIGHTNESS 02/18/2008    MARRIOTT,AMY W. 12/20/2015, 5:12 PM  Frazier Butt., PT  [facility] [address] [place], [place], [address] Phone: [phone]   Fax:  [phone]  Name: ILYAN CORIELL MRN: [id number] Date of Birth: 1950/11/01

## 2015-12-18 ENCOUNTER — Ambulatory Visit: Payer: Federal, State, Local not specified - PPO

## 2015-12-18 ENCOUNTER — Ambulatory Visit: Payer: Federal, State, Local not specified - PPO | Admitting: Occupational Therapy

## 2015-12-18 DIAGNOSIS — R471 Dysarthria and anarthria: Secondary | ICD-10-CM

## 2015-12-18 DIAGNOSIS — R278 Other lack of coordination: Secondary | ICD-10-CM

## 2015-12-18 DIAGNOSIS — R29898 Other symptoms and signs involving the musculoskeletal system: Secondary | ICD-10-CM

## 2015-12-18 DIAGNOSIS — R279 Unspecified lack of coordination: Secondary | ICD-10-CM

## 2015-12-18 DIAGNOSIS — R258 Other abnormal involuntary movements: Secondary | ICD-10-CM

## 2015-12-18 DIAGNOSIS — R1310 Dysphagia, unspecified: Secondary | ICD-10-CM

## 2015-12-18 DIAGNOSIS — R4189 Other symptoms and signs involving cognitive functions and awareness: Secondary | ICD-10-CM

## 2015-12-18 DIAGNOSIS — R293 Abnormal posture: Secondary | ICD-10-CM

## 2015-12-18 DIAGNOSIS — R269 Unspecified abnormalities of gait and mobility: Secondary | ICD-10-CM | POA: Diagnosis not present

## 2015-12-18 NOTE — Therapy (Signed)
[facility] [address] [place], [place], [address] Phone: [phone]   Fax:  [phone]  Occupational Therapy Treatment  Patient Details  Name: [name] [name] MRN: [id number] Date of Birth: 08/16/1951 Referring Provider: Dr. Carles Collet  Encounter Date: 12/18/2015      OT End of Session - 12/18/15 1242    Visit Number 2   Number of Visits 17   Date for OT Re-Evaluation 02/13/16   Authorization Type BCBS-75 visit limit   OT Start Time [id number]   OT Stop Time 1315   OT Time Calculation (min) 40 min      Past Medical History  Diagnosis Date  . Headache(784.0)   . Cardiac murmur     as a child  . Streptococcal meningitis     as an infant  . Depression   .  OSA (obstructive sleep apnea) 01/17/2011    npsg 2012:  AHI 67/hr. Auto titration 2012:  Optimal pressure 12cm.   Marland Kitchen HEADACHES, HX OF 02/18/2008    Qualifier: Diagnosis of  By: Danny Lawless CMA, [place]    . APPENDECTOMY, HX OF 02/18/2008    Qualifier: Diagnosis of  By: Danny Lawless CMA, [place]    . Parkinson disease ([place]) 11/2014    Past Surgical History  Procedure Laterality Date  . Nasal sinus surgery      x 4 as a child  . Vasectomy    . Appendectomy  1967    There were no vitals filed for this visit.  Visit Diagnosis:  Bradykinesia  Rigidity  Posture abnormality  Decreased coordination  Cognitive deficits      Subjective Assessment - 12/18/15 1242    Pertinent History dx PD 11/2014, hx of depression, mild cognitive impairment per recent neuropsych cognitive testing   Patient Stated Goals Pt reports increased weakness   Currently in Pain? No/denies           Arm bike x 6 mins level 1 for conditioning/ reciprocal movement, pt maintained 40RPM Lengthy discussion regarding perceptual changes ptt is having that cause him to place itmes at edge of countertop of to sit too close to the edge of the mat. Pt also reports his feet ar no always clearing  stairs.  Pt was encouraged to work with PT regarding the stairs and too be aware of position and reach back with both hands  before sitting down. Pt was encouraged to make a concious effort to use bigger movements to side items back on the countertop at home.              PWR [facility]) - 12/18/15 1255    PWR! exercises Moves in [place]! Up 10   PWR! Rock 10   PWR! Twist 10 to each   PWR! Step 10 to each    Comments in modified quadraped  min v.c.             OT Education - 12/18/15 1255    Education provided Yes   Education Details modified quadraped PWR!   Person(s) Educated Patient   Methods Explanation;Demonstration;Verbal cues   Comprehension Verbalized understanding;Returned demonstration          OT Short Term Goals - 12/16/15 1339    OT SHORT TERM GOAL #1   Title Pt will be independent with updated HEP    Period Weeks   Status New   OT SHORT TERM GOAL #2   Title Pt will demonstrate improved ease with feeding as evidenced  by performing PPT#2  in 11 secs or less.   Baseline 14.22   Time 4   Period Weeks   Status New   OT SHORT TERM GOAL #3   Title Pt will verbalize understanding of cognitive compensation strategies/ways to promote cognitive skills prn.   Time 4   Period Weeks   Status New   OT SHORT TERM GOAL #4   Title Test 3 button/ unbutton and set goal as appropriate   Time 4   Period Weeks   Status New   OT SHORT TERM GOAL #5   Title Pt will demonstrate increased ease with dressing as evidenced by decreasing PPT#4  by 5 secs.   Time 4   Period Weeks   Status New           OT Long Term Goals - 12/16/15 1349    OT LONG TERM GOAL #1   Title Pt will verbalize understanding of updated AE/strategies for ADLs/IADLs prn    Time 8   Status New   OT LONG TERM GOAL #2   Title Pt will improve dressing ability as shown by improving time on PPT#4 by at least 10 sec.   Baseline 40.72 secs   Time 8   Period Weeks   Status New   OT LONG  TERM GOAL #3   Title Pt will demonstrate ability to write a paragraph with 100% legibility and no significant decrease in letter size.   Time 8   Period Weeks   Status New   OT LONG TERM GOAL #4   Title Pt will verbalize understanding of ways to prevent future PD-related complications and verbalize understanding of community resources.   Time 8   Period Weeks   Status New   OT LONG TERM GOAL #5   Title -----------------------------------------------------------------------------------------------------------   OT LONG TERM GOAL #6   Title --------------------------------------------------------               Plan - 12/18/15 1332    Clinical Impression Statement Pt is progressing towards goals. Pt returned demonstration of PWR! moves in modified quadraped.   Pt will benefit from skilled therapeutic intervention in order to improve on the following deficits (Retired) Decreased coordination;Decreased endurance;Decreased activity tolerance;Impaired tone;Impaired UE functional use;Decreased knowledge of use of DME;Decreased balance;Decreased cognition;Decreased mobility   Rehab Potential Good   OT Frequency 2x / week   OT Duration 8 weeks   Plan test 3 button/ unbutton-set goal as appropriate, PWR!  exercises in quadraped, supine in prep for bed mobility        Problem List Patient Active Problem List   Diagnosis Date Noted  . Parkinsonian features 09/15/2015  . ED (erectile dysfunction) 09/15/2015  . MDD (major depressive disorder), recurrent episode, severe ([place]) 02/10/2014  . Nonspecific abnormal electrocardiogram (ECG) (EKG) 02/07/2014  . Non-compliant behavior 02/03/2014  . Morton's neuroma of left foot 08/07/2013  . 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 Hyperglycemia 01/26/2012  . Hypogonadism male 01/24/2012  . Syncope and collapse 01/20/2012  .  OSA (obstructive sleep apnea) 01/17/2011  . CHEST TIGHTNESS 02/18/2008     RINE,KATHRYN 12/18/2015, 1:37 PM Theone Murdoch, OTR/L [phone] Phone: [phone] 1:37 PM 12/18/2015 [facility] [address] [place], [place], [address] Phone: [phone]   Fax:  [phone]  Name: [name] [name] MRN: [id number] Date of Birth: August 29, 1951

## 2015-12-18 NOTE — Therapy (Signed)
[facility] [address] [place], [place], [address] Phone: [phone]   Fax:  [phone]  Speech Language Pathology Treatment  Patient Details  Name: [name] [name] MRN: [id number] Date of Birth: 1951-01-24 Referring Provider: Dr. [name] Tat  Encounter Date: 12/18/2015      End of Session - 12/18/15 1206    Visit Number 2   Number of Visits 17   Date for SLP Re-Evaluation 02/10/16   Authorization Type 25   Authorization - Visit Number 1   Authorization - Number of Visits 25   SLP Start Time [id number]   SLP Stop Time  1230   SLP Time Calculation (min) 41 min   Activity Tolerance Patient tolerated treatment well      Past Medical History  Diagnosis Date  . Headache(784.0)   . Cardiac murmur     as a child  . Streptococcal meningitis     as an infant  . Depression   .  OSA (obstructive sleep apnea) 01/17/2011    npsg 2012:  AHI 67/hr. Auto titration 2012:  Optimal pressure 12cm.   Marland Kitchen HEADACHES, HX OF 02/18/2008    Qualifier: Diagnosis of  By: Danny Lawless CMA, [place]    . APPENDECTOMY, HX OF 02/18/2008    Qualifier: Diagnosis of  By: Danny Lawless CMA, [place]    . Parkinson disease ([place]) 11/2014    Past Surgical History  Procedure Laterality Date  . Nasal sinus surgery      x 4 as a child  . Vasectomy    . Appendectomy  1967    There were no vitals filed for this visit.  Visit Diagnosis: Dysarthria  Dysphagia      Subjective Assessment - 12/18/15 1154    Subjective Pt reported what he and SLP did during eval.   Currently in Pain? No/denies               ADULT SLP TREATMENT - 12/18/15 1211    General Information   Behavior/Cognition Alert;Cooperative;Pleasant mood   Treatment Provided   Treatment provided Cognitive-Linquistic   Pain Assessment   Pain Assessment No/denies pain   Cognitive-Linquistic Treatment   Treatment focused on Dysarthria   Skilled Treatment Educated pt re: medication  for PD and timing of food. Suggested pt ask his pharmacist re: if he needs to refrain from food when taking meds. Loud /a/ was targeted today to recalibrate speech loudness in conversation with average 92dB. Pt with phlegm/congestion on last two trials, did not cough/throat clear without cues from SLP. Needed cues to swallow - SLP educated why swallowing was necessary. Semi-structured tasks (sentence and multisentence tasks)  completed with average 73dB. In conversation, pt maintained 70dB approx 60% of the time.   Assessment / Recommendations / Plan   Plan Continue with current plan of care   Progression Toward Goals   Progression toward goals Progressing toward goals          SLP Education - 12/18/15 1205    Education provided Yes   Education Details Timing of Parkinson's meds with food - suggested pt ask his pharmacist re: take meds with or without food is best, need to swallow after cough and rationale for this   Person(s) Educated Patient   Methods Explanation;Demonstration   Comprehension Verbalized understanding;Returned demonstration          SLP Short Term Goals - 12/18/15 1155    SLP SHORT TERM GOAL #1   Title Pt will average loud /a/ of  88dB over 3 sessions   Time 4   Period Weeks   Status New   SLP SHORT TERM GOAL #2   Title Pt will average 70dB during structured speech tasks and simple conversation with occasional min A   Time 4   Period Weeks   Status New   SLP SHORT TERM GOAL #3   Title Pt will utlize compensations to reduce coughing on his saliva while at rest, reporting coughing on saliva reduced by 25% subjectively.   Time 4   Period Weeks   Status New          SLP Long Term Goals - 12/18/15 1632    SLP LONG TERM GOAL #1   Title pt to maintain average 70dB in 15 minutes mod complex conversation outside [place] room over two sessions   Time 8   Period Weeks   Status New   SLP LONG TERM GOAL #2   Title Pt will maintain an average of 70dBfor modcomplex  conversation over 12 minutes with rare minimal assistance over 2 sessions    Time 8   Period Weeks   Status New          Plan - 12/18/15 1615    Clinical Impression Statement Mild to moderate dysarthria/dysphonia continues. He would cont to benefit from skilled ST addressing reduced voice loudness.    Speech Therapy Frequency 2x / week   Duration --  8 weeks   Treatment/Interventions SLP instruction and feedback;Internal/external aids;Patient/family education;Functional tasks;Compensatory strategies;[place]iration precaution training   Potential to Achieve Goals Good   Potential Considerations Severity of impairments   Consulted and Agree with Plan of Care Patient        Problem List Patient Active Problem List   Diagnosis Date Noted  . Parkinsonian features 09/15/2015  . ED (erectile dysfunction) 09/15/2015  . MDD (major depressive disorder), recurrent episode, severe ([place]) 02/10/2014  . Nonspecific abnormal electrocardiogram (ECG) (EKG) 02/07/2014  . Non-compliant behavior 02/03/2014  . Morton's neuroma of left foot 08/07/2013  . 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 Hyperglycemia 01/26/2012  . Hypogonadism male 01/24/2012  . Syncope and collapse 01/20/2012  .  OSA (obstructive sleep apnea) 01/17/2011  . CHEST TIGHTNESS 02/18/2008    SCHINKE,CARL ,MS, CCC-SLP  12/18/2015, 4:35 PM  [facility] [address] [place], [place], [address] Phone: [phone]   Fax:  [phone]   Name: [name] [name] MRN: [id number] Date of Birth: May 17, 1951

## 2015-12-21 ENCOUNTER — Ambulatory Visit: Payer: Federal, State, Local not specified - PPO

## 2015-12-21 ENCOUNTER — Encounter: Payer: Self-pay | Admitting: Physical Therapy

## 2015-12-21 ENCOUNTER — Ambulatory Visit: Payer: Federal, State, Local not specified - PPO | Admitting: Physical Therapy

## 2015-12-21 ENCOUNTER — Ambulatory Visit: Payer: Federal, State, Local not specified - PPO | Admitting: Occupational Therapy

## 2015-12-21 DIAGNOSIS — Z7409 Other reduced mobility: Secondary | ICD-10-CM

## 2015-12-21 DIAGNOSIS — R2681 Unsteadiness on feet: Secondary | ICD-10-CM

## 2015-12-21 DIAGNOSIS — R4701 Aphasia: Secondary | ICD-10-CM

## 2015-12-21 DIAGNOSIS — R258 Other abnormal involuntary movements: Secondary | ICD-10-CM

## 2015-12-21 DIAGNOSIS — R278 Other lack of coordination: Secondary | ICD-10-CM

## 2015-12-21 DIAGNOSIS — R279 Unspecified lack of coordination: Secondary | ICD-10-CM

## 2015-12-21 DIAGNOSIS — R29898 Other symptoms and signs involving the musculoskeletal system: Secondary | ICD-10-CM

## 2015-12-21 DIAGNOSIS — R4189 Other symptoms and signs involving cognitive functions and awareness: Secondary | ICD-10-CM

## 2015-12-21 DIAGNOSIS — R293 Abnormal posture: Secondary | ICD-10-CM

## 2015-12-21 DIAGNOSIS — R471 Dysarthria and anarthria: Secondary | ICD-10-CM

## 2015-12-21 DIAGNOSIS — R269 Unspecified abnormalities of gait and mobility: Secondary | ICD-10-CM | POA: Diagnosis not present

## 2015-12-21 DIAGNOSIS — R1310 Dysphagia, unspecified: Secondary | ICD-10-CM

## 2015-12-21 NOTE — Therapy (Signed)
[facility] [address] [place], [place], [address] Phone: [phone]   Fax:  [phone]  Occupational Therapy Treatment  Patient Details  Name: [name] [name] MRN: [id number] Date of Birth: 1950-12-02 Referring Provider: Dr. Carles Collet  Encounter Date: 12/21/2015      OT End of Session - 12/21/15 1156    Visit Number 3   Number of Visits 17   Date for OT Re-Evaluation 02/13/16   Authorization Type BCBS-75 visit limit   Authorization - Visit Number 3   OT Start Time [id number]   OT Stop Time 1230   OT Time Calculation (min) 42 min   Activity Tolerance Patient tolerated treatment well   Behavior During Therapy Flat affect      Past Medical History  Diagnosis Date  . Headache(784.0)   . Cardiac murmur     as a child  . Streptococcal meningitis     as an infant  . Depression   .  OSA (obstructive sleep apnea) 01/17/2011    npsg 2012:  AHI 67/hr. Auto titration 2012:  Optimal pressure 12cm.   Marland Kitchen HEADACHES, HX OF 02/18/2008    Qualifier: Diagnosis of  By: Danny Lawless CMA, [place]    . APPENDECTOMY, HX OF 02/18/2008    Qualifier: Diagnosis of  By: Danny Lawless CMA, [place]    . Parkinson disease ([place]) 11/2014    Past Surgical History  Procedure Laterality Date  . Nasal sinus surgery      x 4 as a child  . Vasectomy    . Appendectomy  1967    There were no vitals filed for this visit.  Visit Diagnosis:  Bradykinesia  Rigidity  Posture abnormality  Decreased coordination  Unsteadiness  Cognitive deficits  Decreased functional mobility and endurance      Subjective Assessment - 12/21/15 1155    Subjective  "This shouldn't be this hard"   Pertinent History dx PD 11/2014, hx of depression, mild cognitive impairment per recent neuropsych cognitive testing   Patient Stated Goals Pt reports increased weakness   Currently in Pain? No/denies            [facility] OT Assessment - 12/21/15 1200    Observation/Other  Assessments   Observations Buttoning/unbuttoning 3 buttons along table in 43.22sec                     PWR [facility]) - 12/21/15 1202    PWR! exercises Moves in [place];Moves in supine   PWR! Up 15   PWR! Rock 10   PWR! Twist 20   PWR! Step 20   Comments in modified quadraped on mat with min cues for performance/large amplitude.     PWR! Up 15   PWR! Rock 20   PWR! Twist 20   PWR! Step 20   Comments with min cues for incr movement amplitude/effort             OT Education - 12/21/15 1311    Education Details Supine PWR! Moves (basic 4)   Person(s) Educated Patient   Methods Explanation;Demonstration;Verbal cues;Handout   Comprehension Verbalized understanding;Returned demonstration;Verbal cues required          OT Short Term Goals - 12/21/15 1204    OT SHORT TERM GOAL #1   Title Pt will be independent with updated HEP    Period Weeks   Status New   OT SHORT TERM GOAL #2   Title Pt will demonstrate improved ease with feeding as evidenced  by performing PPT#2  in 11 secs or less.   Baseline 14.22   Time 4   Period Weeks   Status New   OT SHORT TERM GOAL #3   Title Pt will verbalize understanding of cognitive compensation strategies/ways to promote cognitive skills prn.   Time 4   Period Weeks   Status New   OT SHORT TERM GOAL #4   Title Pt will demo incr ease with dressing as shown by ability to button/unbutton 3 buttons on table in less than 38sec.   Baseline 43.22sec   Time 4   Period Weeks   Status New   OT SHORT TERM GOAL #5   Title Pt will demonstrate increased ease with dressing as evidenced by decreasing PPT#4  by 5 secs.   Time 4   Period Weeks   Status New           OT Long Term Goals - 12/16/15 1349    OT LONG TERM GOAL #1   Title Pt will verbalize understanding of updated AE/strategies for ADLs/IADLs prn    Time 8   Status New   OT LONG TERM GOAL #2   Title Pt will improve dressing ability as shown by improving time on PPT#4  by at least 10 sec.   Baseline 40.72 secs   Time 8   Period Weeks   Status New   OT LONG TERM GOAL #3   Title Pt will demonstrate ability to write a paragraph with 100% legibility and no significant decrease in letter size.   Time 8   Period Weeks   Status New   OT LONG TERM GOAL #4   Title Pt will verbalize understanding of ways to prevent future PD-related complications and verbalize understanding of community resources.   Time 8   Period Weeks   Status New   OT LONG TERM GOAL #5   Title -----------------------------------------------------------------------------------------------------------   OT LONG TERM GOAL #6   Title --------------------------------------------------------               Plan - 12/21/15 1217    Clinical Impression Statement Pt responds well to cueing for large amplitude movements but fatigues quickly.  Pt encouraged to perform HEP regularly at home.   Plan review PWR! exercises in supine, PWR! hands (basic 4)   OT Home Exercise Plan Education provided:  PWR! moves in supine (basic 4) 12/21/15   Consulted and Agree with Plan of Care Patient        Problem List Patient Active Problem List   Diagnosis Date Noted  . Parkinsonian features 09/15/2015  . ED (erectile dysfunction) 09/15/2015  . MDD (major depressive disorder), recurrent episode, severe ([place]) 02/10/2014  . Nonspecific abnormal electrocardiogram (ECG) (EKG) 02/07/2014  . Non-compliant behavior 02/03/2014  . Morton's neuroma of left foot 08/07/2013  . 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 Hyperglycemia 01/26/2012  . Hypogonadism male 01/24/2012  . Syncope and collapse 01/20/2012  .  OSA (obstructive sleep apnea) 01/17/2011  . CHEST TIGHTNESS 02/18/2008    [facility] 12/21/2015, 1:11 PM  [facility] [address] [place], [place], [address] Phone: [phone]   Fax:   [phone]  Name: [name] [name] MRN: [id number] Date of Birth: [birth year]  Vianne Bulls, OTR/L [facility] [address] [place], [place]  [address] [phone] phone [phone] 12/21/2015 1:11 PM

## 2015-12-21 NOTE — Therapy (Signed)
[facility] [address] [place], [place], [address] Phone: [phone]   Fax:  [phone]  Speech Language Pathology Treatment  Patient Details  Name: [name] [name] MRN: [id number] Date of Birth: [birth year] Referring Provider: Dr. [name] Tat  Encounter Date: 12/21/2015      End of Session - 12/21/15 1141    Visit Number 3   Number of Visits 17   Date for SLP Re-Evaluation 02/10/16   Authorization Type 25   Authorization - Visit Number 2   Authorization - Number of Visits 25   SLP Start Time 1017   SLP Stop Time  1100   SLP Time Calculation (min) 43 min   Activity Tolerance Patient tolerated treatment well      Past Medical History  Diagnosis Date  . Headache(784.0)   . Cardiac murmur     as a child  . Streptococcal meningitis     as an infant  . Depression   .  OSA (obstructive sleep apnea) 01/17/2011    npsg 2012:  AHI 67/hr. Auto titration 2012:  Optimal pressure 12cm.   Marland Kitchen HEADACHES, HX OF 02/18/2008    Qualifier: Diagnosis of  By: Danny Lawless CMA, [place]    . APPENDECTOMY, HX OF 02/18/2008    Qualifier: Diagnosis of  By: Danny Lawless CMA, [place]    . Parkinson disease ([place]) 11/2014    Past Surgical History  Procedure Laterality Date  . Nasal sinus surgery      x 4 as a child  . Vasectomy    . Appendectomy  1967    There were no vitals filed for this visit.  Visit Diagnosis: Dysarthria  Dysphagia  Expressive aphasia      Subjective Assessment - 12/21/15 1024    Subjective "(My talking) is probably pretty low - I need to think about it for it to be loud enough or else it doesn't happen." (pt at 68dB)   Currently in Pain? No/denies               ADULT SLP TREATMENT - 12/21/15 1032    General Information   Behavior/Cognition Alert;Cooperative;Pleasant mood   Treatment Provided   Treatment provided Cognitive-Linquistic   Cognitive-Linquistic Treatment   Treatment focused on  Dysarthria   Skilled Treatment SLP targeted pt's loud /a/ to recalibrate his vocal loudness for conversation, average 93dB. In picture description tasks (multisentence) pt maintained WNL volume 90% of the time. In various conversational topics following this structured task the pt was variable in his consistency of loudness.    Assessment / Recommendations / Plan   Plan Continue with current plan of care   Progression Toward Goals   Progression toward goals Progressing toward goals            SLP Short Term Goals - 12/21/15 1040    SLP SHORT TERM GOAL #1   Title Pt will average loud /a/ of 88dB over 3 sessions   Time 3   Period Weeks   Status On-going   SLP SHORT TERM GOAL #2   Title Pt will average 70dB during structured speech tasks and simple conversation with occasional min A   Time 3   Period Weeks   Status On-going   SLP SHORT TERM GOAL #3   Title Pt will utlize compensations to reduce coughing on his saliva while at rest, reporting coughing on saliva reduced by 25% subjectively.   Time 3   Period Weeks   Status On-going  SLP Long Term Goals - 12/21/15 1041    SLP LONG TERM GOAL #1   Title pt to maintain average 70dB in 15 minutes mod complex conversation outside [place] room over two sessions   Time 7   Period Weeks   Status On-going   SLP LONG TERM GOAL #2   Title Pt will maintain an average of 70dBfor modcomplex conversation over 12 minutes with rare minimal assistance over 2 sessions    Time 7   Period Weeks   Status On-going          Plan - 12/21/15 1142    Clinical Impression Statement Mild to moderate dysarthria/dysphonia continues. He would cont to benefit from skilled ST addressing reduced voice loudness.    Speech Therapy Frequency 2x / week   Duration --  7 weeks   Treatment/Interventions SLP instruction and feedback;Internal/external aids;Patient/family education;Functional tasks;Compensatory strategies;Aspiration precaution training    Potential to Achieve Goals Good   Potential Considerations Severity of impairments   Consulted and Agree with Plan of Care Patient        Problem List Patient Active Problem List   Diagnosis Date Noted  . Parkinsonian features 09/15/2015  . ED (erectile dysfunction) 09/15/2015  . MDD (major depressive disorder), recurrent episode, severe ([place]) 02/10/2014  . Nonspecific abnormal electrocardiogram (ECG) (EKG) 02/07/2014  . Non-compliant behavior 02/03/2014  . Morton's neuroma of left foot 08/07/2013  . 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 Hyperglycemia 01/26/2012  . Hypogonadism male 01/24/2012  . Syncope and collapse 01/20/2012  .  OSA (obstructive sleep apnea) 01/17/2011  . CHEST TIGHTNESS 02/18/2008    [name] [name] ,MS, CCC-SLP  12/21/2015, 11:43 AM  [facility] [address] [place], [place], [address] Phone: [phone]   Fax:  [phone]   Name: [name] [name] MRN: [id number] Date of Birth: 12-29-1950

## 2015-12-21 NOTE — Patient Instructions (Signed)
  Please complete the assigned speech therapy homework and return it to your next session.  

## 2015-12-21 NOTE — Therapy (Signed)
[facility] [address] [place], [place], [address] Phone: [phone]   Fax:  [phone]  Physical Therapy Treatment  Patient Details  Name: [name] [name] MRN: [id number] Date of Birth: 09/09/1951 Referring Provider: [name] Tat  Encounter Date: 12/21/2015      PT End of Session - 12/21/15 1156    Visit Number 2   Number of Visits 17   Date for PT Re-Evaluation 08/24/15   Authorization Type BCBS FED  75 visit limit combined   PT Start Time 1105   PT Stop Time 1145   PT Time Calculation (min) 40 min   Activity Tolerance Patient tolerated treatment well      Past Medical History  Diagnosis Date  . Headache(784.0)   . Cardiac murmur     as a child  . Streptococcal meningitis     as an infant  . Depression   .  OSA (obstructive sleep apnea) 01/17/2011    npsg 2012:  AHI 67/hr. Auto titration 2012:  Optimal pressure 12cm.   Marland Kitchen HEADACHES, HX OF 02/18/2008    Qualifier: Diagnosis of  By: Danny Lawless CMA, [place]    . APPENDECTOMY, HX OF 02/18/2008    Qualifier: Diagnosis of  By: Danny Lawless CMA, [place]    . Parkinson disease ([place]) 11/2014    Past Surgical History  Procedure Laterality Date  . Nasal sinus surgery      x 4 as a child  . Vasectomy    . Appendectomy  1967    There were no vitals filed for this visit.  Visit Diagnosis:  Bradykinesia  Rigidity  Unsteadiness  Postural instability  Decreased coordination  Lack of coordination  Decreased functional mobility and endurance      Subjective Assessment - 12/21/15 1107    Subjective Didn't work on [employer]! HEP. "I guess I'm lazy." Has a recumbent bike he goes on at home 30 min/day.   Patient Stated Goals Pt's goal for therapy is to address the shuffling with walking.   Currently in Pain? No/denies   Pain Onset Today            [facility] PT Assessment - 12/21/15 0001    ROM / Strength   AROM / PROM / Strength Strength  MMT for L/R hip is 5/5  Flex, Ext, ABd.   Bed Mobility   Bed Mobility Rolling Right;Rolling Left;Right Sidelying to Sit;Left Sidelying to Sit;Supine to Sit;Sitting - Scoot to Edge of Bed;Sit to Supine;Sit to Sidelying Right;Sit to Sidelying Left;Scooting to [facility]  Working on sequencing transitions in/out of positions and problem solving difficulties with movement.                        P[facility]) - 12/21/15 1110    PWR! Up x20   PWR! Rock [employer]! Twist x20   PWR Step x20   Comments Standing             PT Education - 12/21/15 1123    Education provided Yes   Education Details Performed and reviewed PWR! HEP. Edu. Pt on bed mobility sequence to aid with difficulties in movement.   Person(s) Educated Patient   Methods Explanation;Demonstration;Verbal cues;Handout   Comprehension Verbalized understanding;Returned demonstration;Need further instruction          PT Short Term Goals - 12/17/15 1657    PT SHORT TERM GOAL #1   Title Pt will be independent with HEP for improved bed  mobility, transfers, balance and gait.  TARGET 01/15/16   Time 4   Period Weeks   Status New   PT SHORT TERM GOAL #2   Title Pt will improve 5x sit<>stand to less than or equal 14 seconds for improved transfer efficiency and safety.   Time 4   Period Weeks   Status New   PT SHORT TERM GOAL #3   Title Pt will improve single limb stance to at least 4 seconds bilateral lower extremities for improved stair and obstacle negotiation.   Time 4   Period Weeks   Status New   PT SHORT TERM GOAL #4   Title 3 minute walk test to be completed, with pt able to ambulate at least 50 ft greater than baseline, for improved endurance for long distance gait for work.   Time 4   Period Weeks   Status New   PT SHORT TERM GOAL #5   Title Pt will verbalize at least 25% improvement in bed mobility.   Time 4   Period Weeks   Status New           PT Long Term Goals - 12/17/15 1705    PT LONG TERM GOAL #1   Title Pt  will verbalize understanding of fall prevention/tips to reduce freezing with gait.  TARGET 02/15/16   Time 8   Period Weeks   Status New   PT LONG TERM GOAL #2   Title Pt will perform at least 8 of 10 reps of sit<>stand from 18 inch surfaces or lower, with minimal to no UE support for improved transfer efficiency and safety.   Time 8   Period Weeks   Status New   PT LONG TERM GOAL #3   Title Pt will improve Functional Gait Assessment to at least 19/30 for decreased fall risk.   Time 8   Period Weeks   PT LONG TERM GOAL #4   Title Pt will negotiate at least 4 steps with one handrail, step through pattern, modified independently without loss of balance.   Time 8   Period Weeks   Status New   PT LONG TERM GOAL #5   Title Pt will verbalize understanding of plans for community fitness upon D/C from PT.   Time 8   Period Weeks   Status New               Plan - 12/21/15 1157    Clinical Impression Statement Skilled session focused on HEP Review and problems solving difficulties with bed mobility and working on sequencing with movement transitions.  Pt had carry over of instruction given this session.   Pt will benefit from skilled therapeutic intervention in order to improve on the following deficits Abnormal gait;Decreased balance;Decreased mobility;Decreased strength;Difficulty walking;Impaired flexibility;Postural dysfunction   Rehab Potential Good   PT Frequency 2x / week   PT Duration 8 weeks  plus eval   PT Treatment/Interventions ADLs/Self Care Home Management;Therapeutic exercise;Therapeutic activities;Functional mobility training;Gait training;Balance training;Neuromuscular re-education   PT Next Visit Plan Review/revisit HEP-initiate HEP for PWR! standing Moves; work on single limb stance (check MMT of hip), stair negotiation, bed mobility   PT Home Exercise Plan Balance training, walking programs, and transitions, speed and amplitude with mobility.        Problem  List Patient Active Problem List   Diagnosis Date Noted  . Parkinsonian features 09/15/2015  . ED (erectile dysfunction) 09/15/2015  . MDD (major depressive disorder), recurrent episode, severe ([place]) 02/10/2014  .  Nonspecific abnormal electrocardiogram (ECG) (EKG) 02/07/2014  . Non-compliant behavior 02/03/2014  . Morton's neuroma of left foot 08/07/2013  . 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 Hyperglycemia 01/26/2012  . Hypogonadism male 01/24/2012  . Syncope and collapse 01/20/2012  .  OSA (obstructive sleep apnea) 01/17/2011  . CHEST TIGHTNESS 02/18/2008   Bjorn Loser, PTA  12/21/2015, 12:05 PM [facility] [address] [place], [place], [address] Phone: [phone]   Fax:  [phone]  Name: [name] [name] MRN: [id number] Date of Birth: Jun 26, 1951

## 2015-12-25 ENCOUNTER — Ambulatory Visit: Payer: Federal, State, Local not specified - PPO | Admitting: Occupational Therapy

## 2015-12-25 ENCOUNTER — Ambulatory Visit: Payer: Federal, State, Local not specified - PPO

## 2015-12-25 DIAGNOSIS — R279 Unspecified lack of coordination: Secondary | ICD-10-CM

## 2015-12-25 DIAGNOSIS — R258 Other abnormal involuntary movements: Secondary | ICD-10-CM

## 2015-12-25 DIAGNOSIS — R4701 Aphasia: Secondary | ICD-10-CM

## 2015-12-25 DIAGNOSIS — R269 Unspecified abnormalities of gait and mobility: Secondary | ICD-10-CM | POA: Diagnosis not present

## 2015-12-25 DIAGNOSIS — R1310 Dysphagia, unspecified: Secondary | ICD-10-CM

## 2015-12-25 DIAGNOSIS — R29898 Other symptoms and signs involving the musculoskeletal system: Secondary | ICD-10-CM

## 2015-12-25 DIAGNOSIS — R471 Dysarthria and anarthria: Secondary | ICD-10-CM

## 2015-12-25 DIAGNOSIS — R278 Other lack of coordination: Secondary | ICD-10-CM

## 2015-12-25 DIAGNOSIS — R293 Abnormal posture: Secondary | ICD-10-CM

## 2015-12-25 DIAGNOSIS — R2681 Unsteadiness on feet: Secondary | ICD-10-CM

## 2015-12-25 NOTE — Therapy (Signed)
[facility] [address] [place], [place], [address] Phone: [phone]   Fax:  [phone]  Occupational Therapy Treatment  Patient Details  Name: [name] [name] MRN: [id number] Date of Birth: 06-Apr-1951 Referring Provider: Dr. Carles Collet  Encounter Date: 12/25/2015      OT End of Session - 12/25/15 1144    Visit Number 4   Number of Visits 17   Date for OT Re-Evaluation 02/13/16   Authorization Type BCBS-75 visit limit   Authorization - Visit Number 4   Authorization - Number of Visits 8   OT Start Time 0933   OT Stop Time 1015   OT Time Calculation (min) 42 min   Activity Tolerance Patient tolerated treatment well   Behavior During Therapy [facility] for tasks assessed/performed      Past Medical History  Diagnosis Date  . Headache(784.0)   . Cardiac murmur     as a child  . Streptococcal meningitis     as an infant  . Depression   .  OSA (obstructive sleep apnea) 01/17/2011    npsg 2012:  AHI 67/hr. Auto titration 2012:  Optimal pressure 12cm.   Marland Kitchen HEADACHES, HX OF 02/18/2008    Qualifier: Diagnosis of  By: Danny Lawless CMA, [place]    . APPENDECTOMY, HX OF 02/18/2008    Qualifier: Diagnosis of  By: Danny Lawless CMA, [place]    . Parkinson disease ([place]) 11/2014    Past Surgical History  Procedure Laterality Date  . Nasal sinus surgery      x 4 as a child  . Vasectomy    . Appendectomy  1967    There were no vitals filed for this visit.  Visit Diagnosis:  Bradykinesia  Rigidity  Decreased coordination  Unsteadiness  Abnormal posture      Subjective Assessment - 12/25/15 1002    Pertinent History dx PD 11/2014, hx of depression, mild cognitive impairment per recent neuropsych cognitive testing   Patient Stated Goals Pt reports increased weakness   Currently in Pain? Yes   Pain Score 3    Pain Location Neck   Pain Descriptors / Indicators Aching   Pain Type Acute pain   Pain Onset Today   Pain Frequency  Intermittent   Aggravating Factors  malpositioning   Pain Relieving Factors moving                 Arm  Bike x 6 mins level 1 for conditioning, pt able to maintain 40 RPM Flipping playing cards with large amplitude movements, mod  v.c./ demo PWR! Hands prior to task        PWR [facility]) - 12/25/15 1145    PWR! exercises Moves in [place]! Up 20   PWR! Rock 10   PWR! Twist 20   PWR! Step 20   Comments quadraped on mat, except PWR! Step, min v.c.-issued as HEP pt verbalizes understanding.               OT Short Term Goals - 12/21/15 1204    OT SHORT TERM GOAL #1   Title Pt will be independent with updated HEP    Period Weeks   Status New   OT SHORT TERM GOAL #2   Title Pt will demonstrate improved ease with feeding as evidenced by performing PPT#2  in 11 secs or less.   Baseline 14.22   Time 4   Period Weeks   Status New   OT SHORT TERM GOAL #  3   Title Pt will verbalize understanding of cognitive compensation strategies/ways to promote cognitive skills prn.   Time 4   Period Weeks   Status New   OT SHORT TERM GOAL #4   Title Pt will demo incr ease with dressing as shown by ability to button/unbutton 3 buttons on table in less than 38sec.   Baseline 43.22sec   Time 4   Period Weeks   Status New   OT SHORT TERM GOAL #5   Title Pt will demonstrate increased ease with dressing as evidenced by decreasing PPT#4  by 5 secs.   Time 4   Period Weeks   Status New           OT Long Term Goals - 12/16/15 1349    OT LONG TERM GOAL #1   Title Pt will verbalize understanding of updated AE/strategies for ADLs/IADLs prn    Time 8   Status New   OT LONG TERM GOAL #2   Title Pt will improve dressing ability as shown by improving time on PPT#4 by at least 10 sec.   Baseline 40.72 secs   Time 8   Period Weeks   Status New   OT LONG TERM GOAL #3   Title Pt will demonstrate ability to write a paragraph with 100% legibility and no significant decrease  in letter size.   Time 8   Period Weeks   Status New   OT LONG TERM GOAL #4   Title Pt will verbalize understanding of ways to prevent future PD-related complications and verbalize understanding of community resources.   Time 8   Period Weeks   Status New   OT LONG TERM GOAL #5   Title -----------------------------------------------------------------------------------------------------------   OT LONG TERM GOAL #6   Title --------------------------------------------------------               Plan - 12/25/15 1007    Clinical Impression Statement Pt is progressing towards goals for PD specific HEP.   Pt will benefit from skilled therapeutic intervention in order to improve on the following deficits (Retired) Decreased coordination;Decreased endurance;Decreased activity tolerance;Impaired tone;Impaired UE functional use;Decreased knowledge of use of DME;Decreased balance;Decreased cognition;Decreased mobility   Plan coordination HEP, review PWR!moves in supine   OT Home Exercise Plan Education provided:  PWR! moves in supine (basic 4) 12/21/15, PWR1 moves quadraped 12/25/15   Consulted and Agree with Plan of Care Patient        Problem List Patient Active Problem List   Diagnosis Date Noted  . Parkinsonian features 09/15/2015  . ED (erectile dysfunction) 09/15/2015  . MDD (major depressive disorder), recurrent episode, severe ([place]) 02/10/2014  . Nonspecific abnormal electrocardiogram (ECG) (EKG) 02/07/2014  . Non-compliant behavior 02/03/2014  . Morton's neuroma of left foot 08/07/2013  . 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 Hyperglycemia 01/26/2012  . Hypogonadism male 01/24/2012  . Syncope and collapse 01/20/2012  .  OSA (obstructive sleep apnea) 01/17/2011  . CHEST TIGHTNESS 02/18/2008    [name] [name] 12/25/2015, 11:46 AM [name] [name], OTR/L [phone] Phone: [phone] 11:46 AM 12/25/2015 [facility] [address] [place], [place], [address] Phone: [phone]   Fax:  [phone]  Name: [name] [name] MRN: [id number] Date of Birth: Feb 13, 1951

## 2015-12-25 NOTE — Therapy (Signed)
[facility] [address] [place], [place], [address] Phone: [phone]   Fax:  [phone]  Speech Language Pathology Treatment  Patient Details  Name: [name] [name] MRN: [id number] Date of Birth: 11/17/1950 Referring Provider: Dr. [name] Tat  Encounter Date: 12/25/2015      End of Session - 12/25/15 0929    Visit Number 4   Number of Visits 17   Date for SLP Re-Evaluation 02/10/16   Authorization Type 25   Authorization - Visit Number 3   Authorization - Number of Visits 25   SLP Start Time [id number]   SLP Stop Time  0930   SLP Time Calculation (min) 43 min   Activity Tolerance Patient tolerated treatment well      Past Medical History  Diagnosis Date  . Headache(784.0)   . Cardiac murmur     as a child  . Streptococcal meningitis     as an infant  . Depression   .  OSA (obstructive sleep apnea) 01/17/2011    npsg 2012:  AHI 67/hr. Auto titration 2012:  Optimal pressure 12cm.   Marland Kitchen HEADACHES, HX OF 02/18/2008    Qualifier: Diagnosis of  By: Danny Lawless CMA, [place]    . APPENDECTOMY, HX OF 02/18/2008    Qualifier: Diagnosis of  By: Danny Lawless CMA, [place]    . Parkinson disease ([name]) 11/2014    Past Surgical History  Procedure Laterality Date  . Nasal sinus surgery      x 4 as a child  . Vasectomy    . Appendectomy  1967    There were no vitals filed for this visit.  Visit Diagnosis: Dysarthria  Dysphagia  Expressive aphasia      Subjective Assessment - 12/25/15 0845    Subjective "I didn't get enough sleep last night."   Currently in Pain? No/denies               ADULT SLP TREATMENT - 12/25/15 0854    General Information   Behavior/Cognition Alert;Cooperative;Pleasant mood   Treatment Provided   Treatment provided Cognitive-Linquistic   Cognitive-Linquistic Treatment   Treatment focused on Dysarthria   Skilled Treatment SLP targeted pt's loud /a/ to recalibrate his vocal loudness for  conversation, average 92dB. In various conversational topics following loud /a/ the pt was variable in his consistency of loudness, however he maintained 70dB with rare min A 85% of the time.   Assessment / Recommendations / Plan   Plan Continue with current plan of care   Progression Toward Goals   Progression toward goals Progressing toward goals            SLP Short Term Goals - 12/25/15 0930    SLP SHORT TERM GOAL #1   Title Pt will average loud /a/ of 88dB over 3 sessions   Time --   Period --   Status Achieved   SLP SHORT TERM GOAL #2   Title Pt will average 70dB during structured speech tasks and simple conversation with occasional min A   Time --   Period --   Status Achieved   SLP SHORT TERM GOAL #3   Title Pt will utlize compensations to reduce coughing on his saliva while at rest, reporting coughing on saliva reduced by 25% subjectively.   Time 3   Period Weeks   Status On-going          SLP Long Term Goals - 12/25/15 0934    SLP LONG TERM GOAL #1  Title pt to maintain average 70dB in 15 minutes mod complex conversation outside [place] room over two sessions   Time 7   Period Weeks   Status On-going   SLP LONG TERM GOAL #2   Title Pt will maintain an average of 70dBfor modcomplex conversation over 12 minutes with rare minimal assistance over 2 sessions    Period Weeks   Status On-going   SLP LONG TERM GOAL #3   Period Weeks          Plan - 12/25/15 [id number]    Clinical Impression Statement Mild to moderate dysarthria/dysphonia continues. He would cont to benefit from skilled ST addressing reduced voice loudness.    Speech Therapy Frequency 2x / week   Duration --  7 weeks   Treatment/Interventions SLP instruction and feedback;Internal/external aids;Patient/family education;Functional tasks;Compensatory strategies;Aspiration precaution training   Potential to Achieve Goals Good   Potential Considerations Severity of impairments   Consulted and Agree with  Plan of Care Patient        Problem List Patient Active Problem List   Diagnosis Date Noted  . Parkinsonian features 09/15/2015  . ED (erectile dysfunction) 09/15/2015  . MDD (major depressive disorder), recurrent episode, severe ([place]) 02/10/2014  . Nonspecific abnormal electrocardiogram (ECG) (EKG) 02/07/2014  . Non-compliant behavior 02/03/2014  . Morton's neuroma of left foot 08/07/2013  . 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 Hyperglycemia 01/26/2012  . Hypogonadism male 01/24/2012  . Syncope and collapse 01/20/2012  .  OSA (obstructive sleep apnea) 01/17/2011  . CHEST TIGHTNESS 02/18/2008    Hoda Hon ,MS, CCC-SLP  12/25/2015, 9:35 AM  [facility] [address] [place], [place], [address] Phone: [phone]   Fax:  [phone]   Name: [name] [name] MRN: [id number] Date of Birth: 03-Oct-1951

## 2015-12-28 ENCOUNTER — Ambulatory Visit: Payer: Federal, State, Local not specified - PPO

## 2015-12-28 ENCOUNTER — Ambulatory Visit: Payer: Federal, State, Local not specified - PPO | Admitting: Occupational Therapy

## 2015-12-28 DIAGNOSIS — R2681 Unsteadiness on feet: Secondary | ICD-10-CM

## 2015-12-28 DIAGNOSIS — Z7409 Other reduced mobility: Secondary | ICD-10-CM

## 2015-12-28 DIAGNOSIS — R471 Dysarthria and anarthria: Secondary | ICD-10-CM

## 2015-12-28 DIAGNOSIS — R269 Unspecified abnormalities of gait and mobility: Secondary | ICD-10-CM | POA: Diagnosis not present

## 2015-12-28 DIAGNOSIS — R1310 Dysphagia, unspecified: Secondary | ICD-10-CM

## 2015-12-28 DIAGNOSIS — R29898 Other symptoms and signs involving the musculoskeletal system: Secondary | ICD-10-CM

## 2015-12-28 DIAGNOSIS — R258 Other abnormal involuntary movements: Secondary | ICD-10-CM

## 2015-12-28 DIAGNOSIS — R279 Unspecified lack of coordination: Secondary | ICD-10-CM

## 2015-12-28 DIAGNOSIS — R4701 Aphasia: Secondary | ICD-10-CM

## 2015-12-28 DIAGNOSIS — R293 Abnormal posture: Secondary | ICD-10-CM

## 2015-12-28 DIAGNOSIS — R278 Other lack of coordination: Secondary | ICD-10-CM

## 2015-12-28 NOTE — Patient Instructions (Signed)
Coordination Exercises  Perform the following exercises for 20 minutes 1 times per day. Perform with both hand(s). Perform using big movements.   Flipping Cards: Place deck of cards on the table. Flip cards over by opening your hand big to grasp and then turn your palm up big, opening hand fully to release.  Deal cards: Hold 1/2 or whole deck in your hand. Use thumb to push card off top of deck with one big push.  Rotate ball with fingertips: Pick up with fingers/thumb and move as much as you can with each turn/movement (clockwise and counter-clockwise).  Toss ball from one hand to the other: Toss big/high.  Toss ball in the air and catch with the same hand: Toss big/high.  Juggle 2 balls: Do not go fast. Pause after each toss.  Rotate 2 golf balls in your hand: Both directions.  Pick up 5-10 coins one at a time and hold in palm. Pick up with big, intentional movements. Do not drag coin to the edge.Then, move coins from palm to fingertips one at time to stack.  Practice writing: Slow down, write big, and focus on forming each letter.

## 2015-12-28 NOTE — Therapy (Signed)
[facility] [address] [place], [place], [address] Phone: [phone]   Fax:  [phone]  Speech Language Pathology Treatment  Patient Details  Name: [name] [name] MRN: [id number] Date of Birth: 1951/06/10 Referring Provider: Dr. [name] Tat  Encounter Date: 12/28/2015      End of Session - 12/28/15 0854    Visit Number 5   Number of Visits 17   Date for SLP Re-Evaluation 02/10/16   Authorization Type 25   Authorization - Visit Number 4   Authorization - Number of Visits 25   SLP Start Time 0805   SLP Stop Time  0846   SLP Time Calculation (min) 41 min   Activity Tolerance Patient tolerated treatment well      Past Medical History  Diagnosis Date  . Headache(784.0)   . Cardiac murmur     as a child  . Streptococcal meningitis     as an infant  . Depression   .  OSA (obstructive sleep apnea) 01/17/2011    npsg 2012:  AHI 67/hr. Auto titration 2012:  Optimal pressure 12cm.   Marland Kitchen HEADACHES, HX OF 02/18/2008    Qualifier: Diagnosis of  By: Danny Lawless CMA, [place]    . APPENDECTOMY, HX OF 02/18/2008    Qualifier: Diagnosis of  By: Danny Lawless CMA, [place]    . Parkinson disease ([place]) 11/2014    Past Surgical History  Procedure Laterality Date  . Nasal sinus surgery      x 4 as a child  . Vasectomy    . Appendectomy  1967    There were no vitals filed for this visit.  Visit Diagnosis: Dysarthria  Dysphagia  Expressive aphasia      Subjective Assessment - 12/28/15 0825    Subjective "I'm really tired this morning."               ADULT SLP TREATMENT - 12/28/15 0825    General Information   Behavior/Cognition Alert;Cooperative;Pleasant mood   Treatment Provided   Treatment provided Cognitive-Linquistic   Cognitive-Linquistic Treatment   Treatment focused on Dysarthria   Skilled Treatment SLP targeted pt's loud /a/ to recalibrate his vocal loudness for conversation, average 90dB. In various  conversational topics following loud /a/ the pt was variable in his consistency of loudness, however, as like last session, he maintained 70dB with rare min A 85% of the time.   Assessment / Recommendations / Plan   Plan Continue with current plan of care   Progression Toward Goals   Progression toward goals Progressing toward goals            SLP Short Term Goals - 12/28/15 0855    SLP SHORT TERM GOAL #1   Title Pt will average loud /a/ of 88dB over 3 sessions   Status Achieved   SLP SHORT TERM GOAL #2   Title Pt will average 70dB during structured speech tasks and simple conversation with occasional min A   Status Achieved   SLP SHORT TERM GOAL #3   Title Pt will utlize compensations to reduce coughing on his saliva while at rest, reporting coughing on saliva reduced by 25% subjectively.   Time 2   Period Weeks   Status On-going          SLP Long Term Goals - 12/28/15 0856    SLP LONG TERM GOAL #1   Title pt to maintain average 70dB in 15 minutes mod complex conversation outside [place] room over two sessions  Time 6   Period Weeks   Status On-going   SLP LONG TERM GOAL #2   Title Pt will maintain an average of 70dBfor modcomplex conversation over 12 minutes with rare minimal assistance over 2 sessions    Time 6   Period Weeks   Status On-going   SLP LONG TERM GOAL #3   Period Weeks          Plan - 12/28/15 [id number]    Clinical Impression Statement Mild to moderate dysarthria/dysphonia continues. Pt is becoming more consistent with conversation of varying topics.  He would cont to benefit from skilled ST addressing reduced voice loudness.    Speech Therapy Frequency 2x / week   Duration --  6 weeks   Treatment/Interventions SLP instruction and feedback;Internal/external aids;Patient/family education;Functional tasks;Compensatory strategies;Aspiration precaution training   Potential to Achieve Goals Good   Potential Considerations Severity of impairments   Consulted  and Agree with Plan of Care Patient        Problem List Patient Active Problem List   Diagnosis Date Noted  . Parkinsonian features 09/15/2015  . ED (erectile dysfunction) 09/15/2015  . MDD (major depressive disorder), recurrent episode, severe ([place]) 02/10/2014  . Nonspecific abnormal electrocardiogram (ECG) (EKG) 02/07/2014  . Non-compliant behavior 02/03/2014  . Morton's neuroma of left foot 08/07/2013  . 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 Hyperglycemia 01/26/2012  . Hypogonadism male 01/24/2012  . Syncope and collapse 01/20/2012  .  OSA (obstructive sleep apnea) 01/17/2011  . CHEST TIGHTNESS 02/18/2008    SCHINKE,CARL ,MS, CCC-SLP  12/28/2015, 8:57 AM  [facility] [address] [place], [place], [address] Phone: [phone]   Fax:  [phone]   Name: [name] [name] MRN: [id number] Date of Birth: April 17, 1951

## 2015-12-28 NOTE — Therapy (Signed)
[name] [address] [place], [place], [address] Phone: [phone]   Fax:  [phone]  Occupational Therapy Treatment  Patient Details  Name: [name] [name] MRN: [id number] Date of Birth: [birth year] Referring Provider: Dr. Carles Collet  Encounter Date: 12/28/2015      OT End of Session - 12/28/15 0857    Visit Number 5   Number of Visits 17   Date for OT Re-Evaluation 02/13/16   Authorization Type [id number] visit limit   Authorization - Visit Number 5   Authorization - Number of Visits 8   OT Start Time 0851   OT Stop Time 0941   OT Time Calculation (min) 50 min   Activity Tolerance Patient tolerated treatment well   Behavior During Therapy [facility] for tasks assessed/performed      Past Medical History  Diagnosis Date  . Headache(784.0)   . Cardiac murmur     as a child  . Streptococcal meningitis     as an infant  . Depression   .  OSA (obstructive sleep apnea) 01/17/2011    npsg 2012:  AHI 67/hr. Auto titration 2012:  Optimal pressure 12cm.   Marland Kitchen HEADACHES, HX OF 02/18/2008    Qualifier: Diagnosis of  By: Danny Lawless CMA, [place]    . APPENDECTOMY, HX OF 02/18/2008    Qualifier: Diagnosis of  By: Danny Lawless CMA, [place]    . Parkinson disease ([place]) 11/2014    Past Surgical History  Procedure Laterality Date  . Nasal sinus surgery      x 4 as a child  . Vasectomy    . Appendectomy  1967    There were no vitals filed for this visit.  Visit Diagnosis:  Bradykinesia  Rigidity  Decreased coordination  Unsteadiness  Abnormal posture  Decreased functional mobility and endurance      Subjective Assessment - 12/28/15 0857    Subjective  "Sleepy this morning"   Pertinent History dx PD 11/2014, hx of depression, mild cognitive impairment per recent neuropsych cognitive testing   Patient Stated Goals Pt reports increased weakness   Currently in Pain? No/denies          PWR! Moves (basic 4) in supine  x15-20 each with min cues For incr movement amplitude.  Arm bike x61min level 1 for reciprocal movement with cues/target of at least 40rpms for intensity while maintaining movement amplitude/reciprocal movement.   Pt maintained 40-50rpms.    Self-care:  Pt instructed in use of journal and repetition and incr focus for memory as well as benefits of aerobic ex in regards to cognition.  Pt instructed in safe ways to add dual task to use of stationary bike and to continue to challenge cognition.  Pt verbalized understanding.                OT Education - 12/28/15 [phone]    Education Details Coordination HEP with focus on large amplitude; Typical changes in Cognition related to PD   Person(s) Educated Patient   Methods Explanation;Handout;Demonstration;Verbal cues   Comprehension Verbalized understanding;Returned demonstration;Verbal cues required          OT Short Term Goals - 12/21/15 1204    OT SHORT TERM GOAL #1   Title Pt will be independent with updated HEP    Period Weeks   Status New   OT SHORT TERM GOAL #2   Title Pt will demonstrate improved ease with feeding as evidenced by performing PPT#2  in 11 secs or less.  Baseline 14.22   Time 4   Period Weeks   Status New   OT SHORT TERM GOAL #3   Title Pt will verbalize understanding of cognitive compensation strategies/ways to promote cognitive skills prn.   Time 4   Period Weeks   Status New   OT SHORT TERM GOAL #4   Title Pt will demo incr ease with dressing as shown by ability to button/unbutton 3 buttons on table in less than 38sec.   Baseline 43.22sec   Time 4   Period Weeks   Status New   OT SHORT TERM GOAL #5   Title Pt will demonstrate increased ease with dressing as evidenced by decreasing PPT#4  by 5 secs.   Time 4   Period Weeks   Status New           OT Long Term Goals - 12/16/15 1349    OT LONG TERM GOAL #1   Title Pt will verbalize understanding of updated AE/strategies for ADLs/IADLs prn     Time 8   Status New   OT LONG TERM GOAL #2   Title Pt will improve dressing ability as shown by improving time on PPT#4 by at least 10 sec.   Baseline 40.72 secs   Time 8   Period Weeks   Status New   OT LONG TERM GOAL #3   Title Pt will demonstrate ability to write a paragraph with 100% legibility and no significant decrease in letter size.   Time 8   Period Weeks   Status New   OT LONG TERM GOAL #4   Title Pt will verbalize understanding of ways to prevent future PD-related complications and verbalize understanding of community resources.   Time 8   Period Weeks   Status New   OT LONG TERM GOAL #5   Title -----------------------------------------------------------------------------------------------------------   OT LONG TERM GOAL #6   Title --------------------------------------------------------               Plan - 12/28/15 0900    Clinical Impression Statement Pt is progressing towards goals and demo good movement amplitude with cueing.   Plan review coordination HEP; PWR! hands (basic 4) HEP; keeping thinking skills sharp/memory compensation handouts   OT Home Exercise Plan Education provided:  PWR! moves in supine (basic 4) 12/21/15, PWR1 moves quadraped 12/25/15   Consulted and Agree with Plan of Care Patient        Problem List Patient Active Problem List   Diagnosis Date Noted  . Parkinsonian features 09/15/2015  . ED (erectile dysfunction) 09/15/2015  . MDD (major depressive disorder), recurrent episode, severe ([place]) 02/10/2014  . Nonspecific abnormal electrocardiogram (ECG) (EKG) 02/07/2014  . Non-compliant behavior 02/03/2014  . Morton's neuroma of left foot 08/07/2013  . 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 Hyperglycemia 01/26/2012  . Hypogonadism male 01/24/2012  . Syncope and collapse 01/20/2012  .  OSA (obstructive sleep apnea) 01/17/2011  . CHEST TIGHTNESS 02/18/2008    [facility] 12/28/2015, 10:08  AM  [facility] [address] [place], [place], [address] Phone: [phone]   Fax:  [phone]  Name: [name] [name] MRN: [id number] Date of Birth: 1951/07/26  Vianne Bulls, OTR/L [facility] [address] [place], [place]  [address] [phone] phone [phone] 12/28/2015 10:08 AM   Vianne Bulls, OTR/L [facility] [address] [place], [place]  [address] [phone] phone [phone] 12/28/2015 10:08 AM

## 2016-01-03 ENCOUNTER — Other Ambulatory Visit (HOSPITAL_COMMUNITY): Payer: Self-pay | Admitting: Psychiatry

## 2016-01-05 ENCOUNTER — Ambulatory Visit: Payer: Federal, State, Local not specified - PPO | Admitting: Speech Pathology

## 2016-01-05 ENCOUNTER — Ambulatory Visit: Payer: Federal, State, Local not specified - PPO | Admitting: Occupational Therapy

## 2016-01-05 ENCOUNTER — Ambulatory Visit: Payer: Federal, State, Local not specified - PPO | Attending: Neurology | Admitting: Physical Therapy

## 2016-01-05 ENCOUNTER — Ambulatory Visit: Payer: Self-pay | Admitting: Physical Therapy

## 2016-01-05 ENCOUNTER — Other Ambulatory Visit (HOSPITAL_COMMUNITY): Payer: Self-pay | Admitting: Psychiatry

## 2016-01-05 DIAGNOSIS — R490 Dysphonia: Secondary | ICD-10-CM | POA: Insufficient documentation

## 2016-01-05 DIAGNOSIS — R269 Unspecified abnormalities of gait and mobility: Secondary | ICD-10-CM | POA: Insufficient documentation

## 2016-01-05 DIAGNOSIS — R471 Dysarthria and anarthria: Secondary | ICD-10-CM

## 2016-01-05 DIAGNOSIS — R1310 Dysphagia, unspecified: Secondary | ICD-10-CM | POA: Diagnosis present

## 2016-01-05 DIAGNOSIS — R293 Abnormal posture: Secondary | ICD-10-CM | POA: Insufficient documentation

## 2016-01-05 DIAGNOSIS — R29898 Other symptoms and signs involving the musculoskeletal system: Secondary | ICD-10-CM | POA: Diagnosis present

## 2016-01-05 DIAGNOSIS — R2689 Other abnormalities of gait and mobility: Secondary | ICD-10-CM | POA: Insufficient documentation

## 2016-01-05 DIAGNOSIS — R41840 Attention and concentration deficit: Secondary | ICD-10-CM | POA: Insufficient documentation

## 2016-01-05 DIAGNOSIS — R4701 Aphasia: Secondary | ICD-10-CM | POA: Diagnosis present

## 2016-01-05 DIAGNOSIS — R29818 Other symptoms and signs involving the nervous system: Secondary | ICD-10-CM

## 2016-01-05 DIAGNOSIS — R278 Other lack of coordination: Secondary | ICD-10-CM | POA: Diagnosis present

## 2016-01-05 DIAGNOSIS — R2681 Unsteadiness on feet: Secondary | ICD-10-CM | POA: Diagnosis present

## 2016-01-05 NOTE — Therapy (Addendum)
[facility] [address] [place], [place], [address] Phone: [phone]   Fax:  [phone]  Speech Language Pathology Treatment  Patient Details  Name: UNKOWN DEFRONZO MRN: [id number] Date of Birth: 1951/03/16 Referring Provider: Dr. [name] Tat  Encounter Date: 01/05/2016      End of Session - 01/07/16 1018    Visit Number 7   Number of Visits 17   Date for SLP Re-Evaluation 02/10/16   Authorization Type 25   Authorization - Visit Number 7   Authorization - Number of Visits 25   SLP Start Time 0930   SLP Stop Time  1017   SLP Time Calculation (min) 47 min      Past Medical History  Diagnosis Date  . Headache(784.0)   . Cardiac murmur     as a child  . Streptococcal meningitis     as an infant  . Depression   .  OSA (obstructive sleep apnea) 01/17/2011    npsg 2012:  AHI 67/hr. Auto titration 2012:  Optimal pressure 12cm.   Marland Kitchen HEADACHES, HX OF 02/18/2008    Qualifier: Diagnosis of  By: Danny Lawless CMA, [place]    . APPENDECTOMY, HX OF 02/18/2008    Qualifier: Diagnosis of  By: Danny Lawless CMA, [place]    . Parkinson disease ([place]) 11/2014    Past Surgical History  Procedure Laterality Date  . Nasal sinus surgery      x 4 as a child  . Vasectomy    . Appendectomy  1967    There were no vitals filed for this visit.  Visit Diagnosis: Dysarthria and anarthria - Plan: SLP plan of care cert/re-cert      Subjective Assessment - 01/07/16 0939    Subjective "Can I try the /a/ without Hey or hello?"    Currently in Pain? No/denies               ADULT SLP TREATMENT - 01/07/16 0939    General Information   Behavior/Cognition Alert;Cooperative;Pleasant mood   Treatment Provided   Treatment provided Cognitive-Linquistic   Cognitive-Linquistic Treatment   Treatment focused on Dysarthria;Voice   Skilled Treatment Loud /a/ to recalibrate voice average 88dB, without hoarseness today. Structured speech tasks  average of  70dB with rare min A. Simple conversation over 10 minutes average 70B with rare min A with intermittent hoarseness - pt required cues for hard swallows to clear voice.   Assessment / Recommendations / Plan   Plan Continue with current plan of care   Progression Toward Goals   Progression toward goals Progressing toward goals          SLP Education - 01/07/16 1017    Education provided Yes   Education Details compensations for saliva management   Person(s) Educated Patient   Methods Explanation;Demonstration   Comprehension Verbal cues required;Verbalized understanding          SLP Short Term Goals - 01/07/16 1015    SLP SHORT TERM GOAL #1   Title Pt will average loud /a/ of 88dB over 3 sessions   Status Achieved   SLP SHORT TERM GOAL #2   Title Pt will average 70dB during structured speech tasks and simple conversation with occasional min A   Status Achieved   SLP SHORT TERM GOAL #3   Title Pt will utlize compensations to reduce coughing on his saliva while at rest, reporting coughing on saliva reduced by 25% subjectively.   Time 1   Period Weeks  Status On-going          SLP Long Term Goals - 01/05/16 1209    SLP LONG TERM GOAL #1   Title pt to maintain average 70dB in 15 minutes mod complex conversation outside [place] room over two sessions   Time 5   Period Weeks   Status On-going   SLP LONG TERM GOAL #2   Title Pt will maintain an average of 70dBfor modcomplex conversation over 12 minutes with rare minimal assistance over 2 sessions    Time 5   Period Weeks   Status On-going   SLP LONG TERM GOAL #3   Period Weeks          Plan - 01/07/16 1014    Clinical Impression Statement Pt maintaining average of 70dB duirng structured speech tasks and simple conversation with occasional min visual and  verbal cues to reduce volume decay. Continue skilled ST to maximize intellgibility.   Speech Therapy Frequency 2x / week   Treatment/Interventions SLP  instruction and feedback;Internal/external aids;Patient/family education;Functional tasks;Compensatory strategies;Aspiration precaution training   Potential to Achieve Goals Good   Potential Considerations Severity of impairments   Consulted and Agree with Plan of Care Patient        Problem List Patient Active Problem List   Diagnosis Date Noted  . Parkinsonian features 09/15/2015  . ED (erectile dysfunction) 09/15/2015  . MDD (major depressive disorder), recurrent episode, severe ([place]) 02/10/2014  . Nonspecific abnormal electrocardiogram (ECG) (EKG) 02/07/2014  . Non-compliant behavior 02/03/2014  . Morton's neuroma of left foot 08/07/2013  . 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 Hyperglycemia 01/26/2012  . Hypogonadism male 01/24/2012  . Syncope and collapse 01/20/2012  .  OSA (obstructive sleep apnea) 01/17/2011  . CHEST TIGHTNESS 02/18/2008    Lovvorn, Annye Rusk MS, CCC-SLP 01/07/2016, 1:10 PM  [facility] [address] [place], [place], [address] Phone: [phone]   Fax:  [phone]   Name: [name] [name] MRN: [id number] Date of Birth: 10/14/1950

## 2016-01-05 NOTE — Patient Instructions (Addendum)
When practicing /ah/, start with hello or hey. When you hear gravel or frogginess in your voice, stop vocalizing.   Make sure you are hydrating well--- getting plenty of water. Try swallowing rather than coughing.   Practice at the sentence level after completing your /ah/s.

## 2016-01-05 NOTE — Telephone Encounter (Signed)
Given on March 6 for 90 days.  Too soon to refill

## 2016-01-05 NOTE — Therapy (Signed)
[facility] [address] [place], [place], [address] Phone: [phone]   Fax:  [phone]  Physical Therapy Treatment  Patient Details  Name: [name] [name] MRN: [id number] Date of Birth: December 27, 1950 Referring Provider: [name] Tat  Encounter Date: 01/05/2016      PT End of Session - 01/05/16 1306    Visit Number 3   Number of Visits 17   Date for PT Re-Evaluation 08/24/15   Authorization Type BCBS FED  75 visit limit combined   PT Start Time 1019   PT Stop Time 1100   PT Time Calculation (min) 41 min   Activity Tolerance Patient tolerated treatment well   Behavior During Therapy [facility] for tasks assessed/performed      Past Medical History  Diagnosis Date  . Headache(784.0)   . Cardiac murmur     as a child  . Streptococcal meningitis     as an infant  . Depression   .  OSA (obstructive sleep apnea) 01/17/2011    npsg 2012:  AHI 67/hr. Auto titration 2012:  Optimal pressure 12cm.   Marland Kitchen HEADACHES, HX OF 02/18/2008    Qualifier: Diagnosis of  By: Danny Lawless CMA, [place]    . APPENDECTOMY, HX OF 02/18/2008    Qualifier: Diagnosis of  By: Danny Lawless CMA, [place]    . Parkinson disease ([place]) 11/2014    Past Surgical History  Procedure Laterality Date  . Nasal sinus surgery      x 4 as a child  . Vasectomy    . Appendectomy  1967    There were no vitals filed for this visit.  Visit Diagnosis:  Abnormality of gait      Subjective Assessment - 01/05/16 1029    Subjective Has been doing exercises intermittently.  Does not remember reviewing bed mobility.   Patient Stated Goals Pt's goal for therapy is to address the shuffling with walking.   Currently in Pain? No/denies                         [facility] Adult PT Treatment/Exercise - 01/05/16 1030    Transfers   Transfers Sit to Stand;Stand to Sit   Sit to Stand 6: Modified independent (Device/Increase time);Without upper extremity assist;From  chair/3-in-1   Stand to Sit 6: Modified independent (Device/Increase time);Without upper extremity assist;To chair/3-in-1   Ambulation/Gait   Ambulation/Gait Yes   Ambulation/Gait Assistance 5: Supervision   Ambulation Distance (Feet) 551 Feet  3' walk test   Assistive device None   Gait Pattern Decreased arm swing - right;Decreased arm swing - left;Step-through pattern;Decreased step length - right;Decreased step length - left;Narrow base of support;Trunk flexed;Decreased trunk rotation   Ambulation Surface Level;Indoor   Stairs Yes   Stairs Assistance 5: Supervision   Stairs Assistance Details (indicate cue type and reason) difficulty with perception and getting full foot on step   Stair Management Technique Two rails;Alternating pattern   Number of Stairs 12   Height of Stairs 8   Knee/Hip Exercises: Aerobic   Recumbent Bike Scifit level 2.5 all 4 extremities x 8 minutes with rpm>100   Knee/Hip Exercises: Standing   Forward Step Up Both;15 reps;Hand Hold: 2;Step Height: 6"   Forward Step Up Limitations needs UE assist   Step Down Both;15 reps;Step Height: 6";Hand Hold: 2  needs UE assist   Other Standing Knee Exercises taps to 6", 12", 6" and floor with UE assist x 20 reps working on clearing  foot to step and hip flexion           [facility]) - 01/05/16 1254    PWR! exercises Moves in standing   PWR! Up 20   PWR! Rock 20   PWR! Twist 20   PWR Step 20   Comments in standing with cues for intensity             PT Education - 01/05/16 1305    Education provided Yes   Education Details Reviewed standing PWR!   Person(s) Educated Patient   Methods Explanation;Demonstration   Comprehension Verbalized understanding          PT Short Term Goals - 12/17/15 1657    PT SHORT TERM GOAL #1   Title Pt will be independent with HEP for improved bed mobility, transfers, balance and gait.  TARGET 01/15/16   Time 4   Period Weeks   Status New   PT SHORT TERM GOAL #2    Title Pt will improve 5x sit<>stand to less than or equal 14 seconds for improved transfer efficiency and safety.   Time 4   Period Weeks   Status New   PT SHORT TERM GOAL #3   Title Pt will improve single limb stance to at least 4 seconds bilateral lower extremities for improved stair and obstacle negotiation.   Time 4   Period Weeks   Status New   PT SHORT TERM GOAL #4   Title 3 minute walk test to be completed, with pt able to ambulate at least 50 ft greater than baseline, for improved endurance for long distance gait for work.   Time 4   Period Weeks   Status New   PT SHORT TERM GOAL #5   Title Pt will verbalize at least 25% improvement in bed mobility.   Time 4   Period Weeks   Status New           PT Long Term Goals - 12/17/15 1705    PT LONG TERM GOAL #1   Title Pt will verbalize understanding of fall prevention/tips to reduce freezing with gait.  TARGET 02/15/16   Time 8   Period Weeks   Status New   PT LONG TERM GOAL #2   Title Pt will perform at least 8 of 10 reps of sit<>stand from 18 inch surfaces or lower, with minimal to no UE support for improved transfer efficiency and safety.   Time 8   Period Weeks   Status New   PT LONG TERM GOAL #3   Title Pt will improve Functional Gait Assessment to at least 19/30 for decreased fall risk.   Time 8   Period Weeks   PT LONG TERM GOAL #4   Title Pt will negotiate at least 4 steps with one handrail, step through pattern, modified independently without loss of balance.   Time 8   Period Weeks   Status New   PT LONG TERM GOAL #5   Title Pt will verbalize understanding of plans for community fitness upon D/C from PT.   Time 8   Period Weeks   Status New               Plan - 01/05/16 1306    Clinical Impression Statement Pt with difficulty with hip flexion range with repetition on step.  Continue PT per POC.   Pt will benefit from skilled therapeutic intervention in order to improve on the following deficits  Abnormal gait;Decreased balance;Decreased mobility;Decreased strength;Difficulty  walking;Impaired flexibility;Postural dysfunction   Rehab Potential Good   PT Frequency 2x / week   PT Duration 8 weeks  plus eval   PT Treatment/Interventions ADLs/Self Care Home Management;Therapeutic exercise;Therapeutic activities;Functional mobility training;Gait training;Balance training;Neuromuscular re-education   PT Next Visit Plan Review/revisit HEP-initiate HEP for PWR! standing Moves; work on single limb stance (check MMT of hip), stair negotiation, bed mobility   PT Home Exercise Plan Walking program, speed and amplitude with mobility.   Consulted and Agree with Plan of Care Patient        Problem List Patient Active Problem List   Diagnosis Date Noted  . Parkinsonian features 09/15/2015  . ED (erectile dysfunction) 09/15/2015  . MDD (major depressive disorder), recurrent episode, severe ([place]) 02/10/2014  . Nonspecific abnormal electrocardiogram (ECG) (EKG) 02/07/2014  . Non-compliant behavior 02/03/2014  . Morton's neuroma of left foot 08/07/2013  . 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 Hyperglycemia 01/26/2012  . Hypogonadism male 01/24/2012  . Syncope and collapse 01/20/2012  .  OSA (obstructive sleep apnea) 01/17/2011  . CHEST TIGHTNESS 02/18/2008    Narda Bonds 01/05/2016, 1:14 PM  [facility] [address] [place], [place], [address] Phone: [phone]   Fax:  [phone]  Name: [name] [name] MRN: [id number] Date of Birth: 02/19/1951    [place], [place] 01/05/2016 1:14 PM Phone: [phone] Fax: [phone]

## 2016-01-05 NOTE — Patient Instructions (Signed)
  PWR! Hands  With arms stretched out in front of you (elbows straight), perform the following:  PWR! Rock: Move wrists up and down [employer]! Twist: Twist palms up and down BIG  Then, start with elbows bent and hands closed.  PWR! Step: Touch index finger to thumb while keeping other fingers straight. Flick fingers out BIG (thumb out/straighten fingers). Repeat with other fingers. (Step your thumb to each finger).  PWR! Hands: Push hands out BIG. Elbows straight, wrists up, fingers open and spread apart BIG. (Can also perform by pushing down on table, chair, knees. Push above head, out to the side, behind you, in front of you.)   ** Make each movement big and deliberate so that you feel the movement.  Perform at least 10 repetitions 1x/day, but perform PWR! hands throughout the day when you are having trouble using your hands (picking up/manipulating small objects, writing, eating, typing, sewing, buttoning, etc.).   Coordination Exercises  Perform the following exercises for 20 minutes 1 times per day. Perform with both hand(s). Perform using big movements.   Flipping Cards: Place deck of cards on the table. Flip cards over by opening your hand big to grasp and then turn your palm up big, opening hand fully to release.  Deal cards: Hold 1/2 or whole deck in your hand. Use thumb to push card off top of deck with one big push.  Rotate ball with fingertips: Pick up with fingers/thumb and move as much as you can with each turn/movement (clockwise and counter-clockwise).  Toss ball from one hand to the other: Toss big/high.  Deliberately open with toss and deliberately close hand after catch.  Toss ball in the air and catch with the same hand: Toss big/high.  Deliberately open with toss and deliberately close hand after catch.  Rotate 2 golf balls in your hand: Both directions.  Pick up 5-10 coins one at a time and hold in palm. Then, move coins from palm to fingertips one at a time  to stack.  Practice writing: Slow down, write big, and focus on forming each letter.  Fasten nuts/bolts or put on bottle caps: Turn as much/as big as you can with each turn.

## 2016-01-05 NOTE — Therapy (Signed)
[facility] [address] [place], [place], [address] Phone: [phone]   Fax:  [phone]  Occupational Therapy Treatment  Patient Details  Name: [name] [name] MRN: [id number] Date of Birth: Mar 04, 1951 Referring Provider: Dr. Carles Collet  Encounter Date: 01/05/2016      OT End of Session - 01/05/16 1217    Visit Number 6   Number of Visits 17   Date for OT Re-Evaluation 02/13/16   Authorization Type BCBS-75 visit limit   OT Start Time 1104   OT Stop Time 1152   OT Time Calculation (min) 48 min   Activity Tolerance Patient tolerated treatment well   Behavior During Therapy [facility] for tasks assessed/performed      Past Medical History  Diagnosis Date  . Headache(784.0)   . Cardiac murmur     as a child  . Streptococcal meningitis     as an infant  . Depression   .  OSA (obstructive sleep apnea) 01/17/2011    npsg 2012:  AHI 67/hr. Auto titration 2012:  Optimal pressure 12cm.   Marland Kitchen HEADACHES, HX OF 02/18/2008    Qualifier: Diagnosis of  By: Danny Lawless CMA, [place]    . APPENDECTOMY, HX OF 02/18/2008    Qualifier: Diagnosis of  By: Danny Lawless CMA, [place]    . Parkinson disease ([place]) 11/2014    Past Surgical History  Procedure Laterality Date  . Nasal sinus surgery      x 4 as a child  . Vasectomy    . Appendectomy  1967    There were no vitals filed for this visit.  Visit Diagnosis:  Other lack of coordination  Other symptoms and signs involving the nervous system  Other symptoms and signs involving the musculoskeletal system  Attention and concentration deficit  Abnormal posture      Subjective Assessment - 01/05/16 1225    Subjective  Pt reports that he has trouble trying to count his exercises   Pertinent History dx PD 11/2014, hx of depression, mild cognitive impairment per recent neuropsych cognitive testing   Patient Stated Goals Pt reports increased weakness   Currently in Pain? No/denies                               OT Education - 01/05/16 1225    Education Details Coordination HEP (reissued) and PWR! hands (basic 4); importance of trying to work divided attnetion in safe ways (counting exercises); emphasis on large movement with each repetition every time and allow time for full movement   Person(s) Educated Patient   Methods Explanation;Demonstration;Handout;Verbal cues   Comprehension Verbalized understanding;Returned demonstration;Verbal cues required--min-mod cues, mod difficulty counting ex with performance (dual task)          OT Short Term Goals - 12/21/15 1204    OT SHORT TERM GOAL #1   Title Pt will be independent with updated HEP    Period Weeks   Status New   OT SHORT TERM GOAL #2   Title Pt will demonstrate improved ease with feeding as evidenced by performing PPT#2  in 11 secs or less.   Baseline 14.22   Time 4   Period Weeks   Status New   OT SHORT TERM GOAL #3   Title Pt will verbalize understanding of cognitive compensation strategies/ways to promote cognitive skills prn.   Time 4   Period Weeks   Status New   OT SHORT TERM GOAL #  4   Title Pt will demo incr ease with dressing as shown by ability to button/unbutton 3 buttons on table in less than 38sec.   Baseline 43.22sec   Time 4   Period Weeks   Status New   OT SHORT TERM GOAL #5   Title Pt will demonstrate increased ease with dressing as evidenced by decreasing PPT#4  by 5 secs.   Time 4   Period Weeks   Status New           OT Long Term Goals - 12/16/15 1349    OT LONG TERM GOAL #1   Title Pt will verbalize understanding of updated AE/strategies for ADLs/IADLs prn    Time 8   Status New   OT LONG TERM GOAL #2   Title Pt will improve dressing ability as shown by improving time on PPT#4 by at least 10 sec.   Baseline 40.72 secs   Time 8   Period Weeks   Status New   OT LONG TERM GOAL #3   Title Pt will demonstrate ability to write a paragraph with 100%  legibility and no significant decrease in letter size.   Time 8   Period Weeks   Status New   OT LONG TERM GOAL #4   Title Pt will verbalize understanding of ways to prevent future PD-related complications and verbalize understanding of community resources.   Time 8   Period Weeks   Status New   OT LONG TERM GOAL #5   Title -----------------------------------------------------------------------------------------------------------   OT LONG TERM GOAL #6   Title --------------------------------------------------------               Plan - 01/05/16 1220    Clinical Impression Statement Pt is progressing towards goals and demo improved movement amplitude with cueing and repetition.  Pt demo difficulty with simple divided attention/dual task (counting exercises with focus on large amplitude).   Plan keeping thinking skills sharp/memory compensation handouts, continue with large amplitude movements for functional tasks, Arm bike   OT Home Exercise Plan Education provided:  PWR! moves in supine (basic 4) 12/21/15, PWR! moves quadraped 12/25/15; 01/05/16 PWR! hands (basic 4), coordination HEP   Consulted and Agree with Plan of Care Patient        Problem List Patient Active Problem List   Diagnosis Date Noted  . Parkinsonian features 09/15/2015  . ED (erectile dysfunction) 09/15/2015  . MDD (major depressive disorder), recurrent episode, severe ([place]) 02/10/2014  . Nonspecific abnormal electrocardiogram (ECG) (EKG) 02/07/2014  . Non-compliant behavior 02/03/2014  . Morton's neuroma of left foot 08/07/2013  . 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 Hyperglycemia 01/26/2012  . Hypogonadism male 01/24/2012  . Syncope and collapse 01/20/2012  .  OSA (obstructive sleep apnea) 01/17/2011  . CHEST TIGHTNESS 02/18/2008    [facility] 01/05/2016, 12:30 PM  [facility] [address] [place], [place], [address] Phone: [phone]   Fax:  [phone]  Name: [name] [name] MRN: [id number] Date of Birth: 1951-01-17   Vianne Bulls, OTR/L [facility] [address] [place], [place]  [address] [phone] phone [phone] 01/05/2016 12:31 PM

## 2016-01-07 ENCOUNTER — Ambulatory Visit: Payer: Federal, State, Local not specified - PPO | Admitting: Physical Therapy

## 2016-01-07 ENCOUNTER — Ambulatory Visit: Payer: Federal, State, Local not specified - PPO | Admitting: Occupational Therapy

## 2016-01-07 ENCOUNTER — Ambulatory Visit: Payer: Federal, State, Local not specified - PPO | Admitting: Speech Pathology

## 2016-01-07 DIAGNOSIS — R278 Other lack of coordination: Secondary | ICD-10-CM

## 2016-01-07 DIAGNOSIS — R29898 Other symptoms and signs involving the musculoskeletal system: Secondary | ICD-10-CM

## 2016-01-07 DIAGNOSIS — R269 Unspecified abnormalities of gait and mobility: Secondary | ICD-10-CM | POA: Diagnosis not present

## 2016-01-07 DIAGNOSIS — R29818 Other symptoms and signs involving the nervous system: Secondary | ICD-10-CM

## 2016-01-07 DIAGNOSIS — R471 Dysarthria and anarthria: Secondary | ICD-10-CM

## 2016-01-07 DIAGNOSIS — R41840 Attention and concentration deficit: Secondary | ICD-10-CM

## 2016-01-07 DIAGNOSIS — R293 Abnormal posture: Secondary | ICD-10-CM

## 2016-01-07 NOTE — Patient Instructions (Signed)
Keeping Thinking Skills Sharp: 1. Jigsaw puzzles 2. Card/board games 3. Talking on the phone/social events 4. Lumosity.com 5. Online games 6. Word serches/crossword puzzles 7.  Logic puzzles 8. Aerobic exercise (stationary bike) 9. Eating balanced diet (fruits & veggies) 10. Drink water 11. Try something new--new recipe, hobby 12. Crafts 13. Do a variety of activities that are challenging 14. Add cognitive activities to walking/exercising (think of animal/food/city with each letter of the alphabet, counting backwards, thinking of as many vegetables as you can, etc.).--Only do this  If safe (no freezing/falls). Memory Compensation Strategies  1. Use "WARM" strategy.  W= write it down  A= associate it  R= repeat it  M= make a mental note  2.   You can keep a Memory Notebook.  Use a 3-ring notebook with sections for the following: calendar, important names and phone numbers,  medications, doctors' names/phone numbers, lists/reminders, and a section to journal what you did  each day.   3.    Use a calendar to write appointments down.  4.    Write yourself a schedule for the day.  This can be placed on the calendar or in a separate section of the Memory Notebook.  Keeping a  regular schedule can help memory.  5.    Use medication organizer with sections for each day or morning/evening pills.  You may need help loading it  6.    Keep a basket, or pegboard by the door.  Place items that you need to take out with you in the basket or on the pegboard.  You may also want to  include a message board for reminders.  7.    Use sticky notes.  Place sticky notes with reminders in a place where the task is performed.  For example: " turn off the  stove" placed by the stove, "lock the door" placed on the door at eye level, " take your medications" on  the bathroom mirror or by the place where you normally take your medications.  8.    Use alarms/timers.  Use while cooking to remind yourself  to check on food or as a reminder to take your medicine, or as a  reminder to make a call, or as a reminder to perform another task, etc.  

## 2016-01-07 NOTE — Addendum Note (Signed)
Addended by: Georgiann Hahn A on: 01/07/2016 01:10 PM   Modules accepted: Orders

## 2016-01-07 NOTE — Therapy (Signed)
[facility] [address] [place], [place], [address] Phone: [phone]   Fax:  [phone]  Speech Language Pathology Treatment  Patient Details  Name: [name] [name] MRN: [id number] Date of Birth: [birth year] Referring Provider: Dr. [name] Tat  Encounter Date: 01/07/2016      End of Session - 01/07/16 1018    Visit Number 7   Number of Visits 17   Date for SLP Re-Evaluation 02/10/16   Authorization Type 25   Authorization - Visit Number 7   Authorization - Number of Visits 25   SLP Start Time 0930   SLP Stop Time  1017   SLP Time Calculation (min) 47 min      Past Medical History  Diagnosis Date  . Headache(784.0)   . Cardiac murmur     as a child  . Streptococcal meningitis     as an infant  . Depression   .  OSA (obstructive sleep apnea) 01/17/2011    npsg 2012:  AHI 67/hr. Auto titration 2012:  Optimal pressure 12cm.   Marland Kitchen HEADACHES, HX OF 02/18/2008    Qualifier: Diagnosis of  By: Danny Lawless CMA, [place]    . APPENDECTOMY, HX OF 02/18/2008    Qualifier: Diagnosis of  By: Danny Lawless CMA, [place]    . Parkinson disease ([place]) 11/2014    Past Surgical History  Procedure Laterality Date  . Nasal sinus surgery      x 4 as a child  . Vasectomy    . Appendectomy  1967    There were no vitals filed for this visit.  Visit Diagnosis: Dysarthria      Subjective Assessment - 01/07/16 0939    Subjective "Can I try the /a/ without Hey or hello?"    Currently in Pain? No/denies               ADULT SLP TREATMENT - 01/07/16 0939    General Information   Behavior/Cognition Alert;Cooperative;Pleasant mood   Treatment Provided   Treatment provided Cognitive-Linquistic   Cognitive-Linquistic Treatment   Treatment focused on Dysarthria;Voice   Skilled Treatment Loud /a/ to recalibrate voice average 88dB, without hoarseness today. Structured speech tasks average of  70dB with rare min A. Simple  conversation over 10 minutes average 70B with rare min A with intermittent hoarseness - pt required cues for hard swallows to clear voice.   Assessment / Recommendations / Plan   Plan Continue with current plan of care   Progression Toward Goals   Progression toward goals Progressing toward goals          SLP Education - 01/07/16 1017    Education provided Yes   Education Details compensations for saliva management   Person(s) Educated Patient   Methods Explanation;Demonstration   Comprehension Verbal cues required;Verbalized understanding          SLP Short Term Goals - 01/07/16 1015    SLP SHORT TERM GOAL #1   Title Pt will average loud /a/ of 88dB over 3 sessions   Status Achieved   SLP SHORT TERM GOAL #2   Title Pt will average 70dB during structured speech tasks and simple conversation with occasional min A   Status Achieved   SLP SHORT TERM GOAL #3   Title Pt will utlize compensations to reduce coughing on his saliva while at rest, reporting coughing on saliva reduced by 25% subjectively.   Time 1   Period Weeks   Status On-going  SLP Long Term Goals - 01/05/16 1209    SLP LONG TERM GOAL #1   Title pt to maintain average 70dB in 15 minutes mod complex conversation outside [place] room over two sessions   Time 5   Period Weeks   Status On-going   SLP LONG TERM GOAL #2   Title Pt will maintain an average of 70dBfor modcomplex conversation over 12 minutes with rare minimal assistance over 2 sessions    Time 5   Period Weeks   Status On-going   SLP LONG TERM GOAL #3   Period Weeks          Plan - 01/07/16 1014    Clinical Impression Statement Pt maintaining average of 70dB duirng structured speech tasks and simple conversation with occasional min visual and  verbal cues to reduce volume decay. Continue skilled ST to maximize intellgibility.   Speech Therapy Frequency 2x / week   Treatment/Interventions SLP instruction and feedback;Internal/external  aids;Patient/family education;Functional tasks;Compensatory strategies;Aspiration precaution training   Potential to Achieve Goals Good   Potential Considerations Severity of impairments   Consulted and Agree with Plan of Care Patient        Problem List Patient Active Problem List   Diagnosis Date Noted  . Parkinsonian features 09/15/2015  . ED (erectile dysfunction) 09/15/2015  . MDD (major depressive disorder), recurrent episode, severe ([place]) 02/10/2014  . Nonspecific abnormal electrocardiogram (ECG) (EKG) 02/07/2014  . Non-compliant behavior 02/03/2014  . Morton's neuroma of left foot 08/07/2013  . 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 Hyperglycemia 01/26/2012  . Hypogonadism male 01/24/2012  . Syncope and collapse 01/20/2012  .  OSA (obstructive sleep apnea) 01/17/2011  . CHEST TIGHTNESS 02/18/2008    Alucard Fearnow, Annye Rusk MS, CCC-SLP 01/07/2016, 10:18 AM  [facility] [address] [place], [place], [address] Phone: [phone]   Fax:  [phone]   Name: [name] [name] MRN: [id number] Date of Birth: June 29, 1951

## 2016-01-07 NOTE — Therapy (Signed)
[facility] [address] [place], [place], [address] Phone: [phone]   Fax:  [phone]  Occupational Therapy Treatment  Patient Details  Name: [name] [name] MRN: [id number] Date of Birth: [birth year] Referring Provider: Dr. Carles Collet  Encounter Date: 01/07/2016      OT End of Session - 01/07/16 0924    Visit Number 7   Number of Visits 17   Date for OT Re-Evaluation 02/13/16   Authorization Type BCBS-75 visit limit   Authorization - Visit Number 7   Authorization - Number of Visits 8   OT Start Time [phone]   OT Stop Time 0930   OT Time Calculation (min) 38 min   Activity Tolerance Patient tolerated treatment well   Behavior During Therapy [facility] for tasks assessed/performed      Past Medical History  Diagnosis Date  . Headache(784.0)   . Cardiac murmur     as a child  . Streptococcal meningitis     as an infant  . Depression   .  OSA (obstructive sleep apnea) 01/17/2011    npsg 2012:  AHI 67/hr. Auto titration 2012:  Optimal pressure 12cm.   Marland Kitchen HEADACHES, HX OF 02/18/2008    Qualifier: Diagnosis of  By: Danny Lawless CMA, [place]    . APPENDECTOMY, HX OF 02/18/2008    Qualifier: Diagnosis of  By: Danny Lawless CMA, [place]    . Parkinson disease ([place]) 11/2014    Past Surgical History  Procedure Laterality Date  . Nasal sinus surgery      x 4 as a child  . Vasectomy    . Appendectomy  1967    There were no vitals filed for this visit.  Visit Diagnosis:  Other symptoms and signs involving the musculoskeletal system  Other symptoms and signs involving the nervous system  Other lack of coordination  Attention and concentration deficit  Abnormal posture      Subjective Assessment - 01/07/16 0858    Pertinent History dx PD 11/2014, hx of depression, mild cognitive impairment per recent neuropsych cognitive testing   Patient Stated Goals Pt reports increased weakness        Treatment: Reviewed PWR! Hands 10  reps each exercise. Tossing, catching and rotating a ball with large amplitude movements, min v.c. Education regarding keeping thinking skills sharp/ memory compensations, pt verbalizes understanding. Dynamic step and reach, to copy small peg design with LUE, min v.c. For larger amplitude movements. Pt demonstrated ability to carry on a conversation while performing task.  Pt correctly completed the design with only min difficulty.                      OT Education - 01/07/16 0902    Education provided Yes   Education Details keeping thinking skills sharp/ memory compensations   Person(s) Educated Patient   Methods Explanation;Demonstration   Comprehension Verbalized understanding;Returned demonstration          OT Short Term Goals - 12/21/15 1204    OT SHORT TERM GOAL #1   Title Pt will be independent with updated HEP    Period Weeks   Status New   OT SHORT TERM GOAL #2   Title Pt will demonstrate improved ease with feeding as evidenced by performing PPT#2  in 11 secs or less.   Baseline 14.22   Time 4   Period Weeks   Status New   OT SHORT TERM GOAL #3   Title Pt will verbalize understanding of  cognitive compensation strategies/ways to promote cognitive skills prn.   Time 4   Period Weeks   Status New   OT SHORT TERM GOAL #4   Title Pt will demo incr ease with dressing as shown by ability to button/unbutton 3 buttons on table in less than 38sec.   Baseline 43.22sec   Time 4   Period Weeks   Status New   OT SHORT TERM GOAL #5   Title Pt will demonstrate increased ease with dressing as evidenced by decreasing PPT#4  by 5 secs.   Time 4   Period Weeks   Status New           OT Long Term Goals - 12/16/15 1349    OT LONG TERM GOAL #1   Title Pt will verbalize understanding of updated AE/strategies for ADLs/IADLs prn    Time 8   Status New   OT LONG TERM GOAL #2   Title Pt will improve dressing ability as shown by improving time on PPT#4 by at  least 10 sec.   Baseline 40.72 secs   Time 8   Period Weeks   Status New   OT LONG TERM GOAL #3   Title Pt will demonstrate ability to write a paragraph with 100% legibility and no significant decrease in letter size.   Time 8   Period Weeks   Status New   OT LONG TERM GOAL #4   Title Pt will verbalize understanding of ways to prevent future PD-related complications and verbalize understanding of community resources.   Time 8   Period Weeks   Status New   OT LONG TERM GOAL #5   Title -----------------------------------------------------------------------------------------------------------   OT LONG TERM GOAL #6   Title --------------------------------------------------------               Plan - 01/07/16 [id number]    Clinical Impression Statement Pt is progressing towards goals  for functional activities with large amplitude movements.   Pt will benefit from skilled therapeutic intervention in order to improve on the following deficits (Retired) Decreased coordination;Decreased endurance;Decreased activity tolerance;Impaired tone;Impaired UE functional use;Decreased knowledge of use of DME;Decreased balance;Decreased cognition;Decreased mobility   Rehab Potential Good   OT Frequency 2x / week   OT Duration 8 weeks   OT Treatment/Interventions Self-care/ADL training;Moist Heat;DME and/or AE instruction;Splinting;Patient/family education;Balance training;Therapeutic exercises;Therapeutic exercise;Therapeutic activities;Cognitive remediation/compensation;Passive range of motion;Functional Mobility Training;Neuromuscular education;Cryotherapy;Electrical Stimulation;Energy conservation;Manual Therapy   OT Home Exercise Plan Education provided:  PWR! moves in supine (basic 4) 12/21/15, PWR! moves quadraped 12/25/15; 01/05/16 PWR! hands (basic 4), coordination HEP, keeping thinking skills sharp/ memory compensations   Consulted and Agree with Plan of Care Patient   Family Member Consulted wife         Problem List Patient Active Problem List   Diagnosis Date Noted  . Parkinsonian features 09/15/2015  . ED (erectile dysfunction) 09/15/2015  . MDD (major depressive disorder), recurrent episode, severe ([place]) 02/10/2014  . Nonspecific abnormal electrocardiogram (ECG) (EKG) 02/07/2014  . Non-compliant behavior 02/03/2014  . Morton's neuroma of left foot 08/07/2013  . 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 Hyperglycemia 01/26/2012  . Hypogonadism male 01/24/2012  . Syncope and collapse 01/20/2012  .  OSA (obstructive sleep apnea) 01/17/2011  . CHEST TIGHTNESS 02/18/2008    RINE,KATHRYN 01/07/2016, 9:25 AM Theone Murdoch, OTR/L [phone] Phone: [phone] 9:26 AM 01/07/2016 [facility] [address] [place], [place], [address] Phone: [phone]   Fax:  [phone]  Name: [name] [name] MRN: [id number] Date of Birth: 1950/11/29

## 2016-01-07 NOTE — Therapy (Signed)
[facility] [address] [place], [place], [address] Phone: [phone]   Fax:  [phone]  Physical Therapy Treatment  Patient Details  Name: [name] [name] MRN: [id number] Date of Birth: [birth year] Referring Provider: [name] Tat  Encounter Date: 01/07/2016      PT End of Session - 01/07/16 0852    Visit Number 4   Number of Visits 17   Date for PT Re-Evaluation 08/24/15   Authorization Type BCBS FED  75 visit limit combined   PT Start Time 0805   PT Stop Time 0845   PT Time Calculation (min) 40 min   Activity Tolerance Patient tolerated treatment well   Behavior During Therapy [facility] for tasks assessed/performed      Past Medical History  Diagnosis Date  . Headache(784.0)   . Cardiac murmur     as a child  . Streptococcal meningitis     as an infant  . Depression   .  OSA (obstructive sleep apnea) 01/17/2011    npsg 2012:  AHI 67/hr. Auto titration 2012:  Optimal pressure 12cm.   Marland Kitchen HEADACHES, HX OF 02/18/2008    Qualifier: Diagnosis of  By: Danny Lawless CMA, [place]    . APPENDECTOMY, HX OF 02/18/2008    Qualifier: Diagnosis of  By: Danny Lawless CMA, [place]    . Parkinson disease ([place]) 11/2014    Past Surgical History  Procedure Laterality Date  . Nasal sinus surgery      x 4 as a child  . Vasectomy    . Appendectomy  1967    There were no vitals filed for this visit.  Visit Diagnosis:  Other symptoms and signs involving the musculoskeletal system      Subjective Assessment - 01/07/16 0848    Subjective Denies falls or changes.   Patient Stated Goals Pt's goal for therapy is to address the shuffling with walking.   Currently in Pain? No/denies                         [facility] Adult PT Treatment/Exercise - 01/07/16 0001    Knee/Hip Exercises: Aerobic   Recumbent Bike Scifit level 5.0 all 4 extremities x 10 minutes with rpm>65   Knee/Hip Exercises: Standing   Knee Flexion Both;2 sets;15  reps;Other (comment)  5# weight   Hip Flexion Both;2 sets;15 reps;Other (comment);Knee straight  5# weight   SLS bil LE x 10 sec x 3 with intermittent UE support   Other Standing Knee Exercises bil hip abduction x 15 x 2 with 5# weight   Other Standing Knee Exercises tandem stance bil LE x 10 sec x 3;tandem gait x 8' x 6 with intermittent UE support                  PT Short Term Goals - 12/17/15 1657    PT SHORT TERM GOAL #1   Title Pt will be independent with HEP for improved bed mobility, transfers, balance and gait.  TARGET 01/15/16   Time 4   Period Weeks   Status New   PT SHORT TERM GOAL #2   Title Pt will improve 5x sit<>stand to less than or equal 14 seconds for improved transfer efficiency and safety.   Time 4   Period Weeks   Status New   PT SHORT TERM GOAL #3   Title Pt will improve single limb stance to at least 4 seconds bilateral lower extremities for improved stair and  obstacle negotiation.   Time 4   Period Weeks   Status New   PT SHORT TERM GOAL #4   Title 3 minute walk test to be completed, with pt able to ambulate at least 50 ft greater than baseline, for improved endurance for long distance gait for work.   Time 4   Period Weeks   Status New   PT SHORT TERM GOAL #5   Title Pt will verbalize at least 25% improvement in bed mobility.   Time 4   Period Weeks   Status New           PT Long Term Goals - 12/17/15 1705    PT LONG TERM GOAL #1   Title Pt will verbalize understanding of fall prevention/tips to reduce freezing with gait.  TARGET 02/15/16   Time 8   Period Weeks   Status New   PT LONG TERM GOAL #2   Title Pt will perform at least 8 of 10 reps of sit<>stand from 18 inch surfaces or lower, with minimal to no UE support for improved transfer efficiency and safety.   Time 8   Period Weeks   Status New   PT LONG TERM GOAL #3   Title Pt will improve Functional Gait Assessment to at least 19/30 for decreased fall risk.   Time 8    Period Weeks   PT LONG TERM GOAL #4   Title Pt will negotiate at least 4 steps with one handrail, step through pattern, modified independently without loss of balance.   Time 8   Period Weeks   Status New   PT LONG TERM GOAL #5   Title Pt will verbalize understanding of plans for community fitness upon D/C from PT.   Time 8   Period Weeks   Status New               Plan - 01/07/16 0853    Clinical Impression Statement Pt works hard during therapy sessions and reports increased mobility after session.  Continue PT per POC.   Pt will benefit from skilled therapeutic intervention in order to improve on the following deficits Abnormal gait;Decreased balance;Decreased mobility;Decreased strength;Difficulty walking;Impaired flexibility;Postural dysfunction   Rehab Potential Good   PT Frequency 2x / week   PT Duration 8 weeks  plus eval   PT Treatment/Interventions ADLs/Self Care Home Management;Therapeutic exercise;Therapeutic activities;Functional mobility training;Gait training;Balance training;Neuromuscular re-education   PT Next Visit Plan Begin checking goals.   PT Home Exercise Plan Walking program, speed and amplitude with mobility.   Consulted and Agree with Plan of Care Patient        Problem List Patient Active Problem List   Diagnosis Date Noted  . Parkinsonian features 09/15/2015  . ED (erectile dysfunction) 09/15/2015  . MDD (major depressive disorder), recurrent episode, severe ([place]) 02/10/2014  . Nonspecific abnormal electrocardiogram (ECG) (EKG) 02/07/2014  . Non-compliant behavior 02/03/2014  . Morton's neuroma of left foot 08/07/2013  . 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 Hyperglycemia 01/26/2012  . Hypogonadism male 01/24/2012  . Syncope and collapse 01/20/2012  .  OSA (obstructive sleep apnea) 01/17/2011  . CHEST TIGHTNESS 02/18/2008    Narda Bonds 01/07/2016, 8:55 AM  [facility] [address] [place], [place], [address] Phone: [phone]   Fax:  [phone]  Name: [name] [name] MRN: [id number] Date of Birth: 22-Nov-1950    Narda Bonds, [place] 01/07/2016 8:55  AM Phone: [phone] Fax: [phone]

## 2016-01-11 ENCOUNTER — Ambulatory Visit: Payer: Federal, State, Local not specified - PPO | Admitting: Physical Therapy

## 2016-01-11 DIAGNOSIS — R2689 Other abnormalities of gait and mobility: Secondary | ICD-10-CM

## 2016-01-11 DIAGNOSIS — R269 Unspecified abnormalities of gait and mobility: Secondary | ICD-10-CM | POA: Diagnosis not present

## 2016-01-11 DIAGNOSIS — R2681 Unsteadiness on feet: Secondary | ICD-10-CM

## 2016-01-11 DIAGNOSIS — R293 Abnormal posture: Secondary | ICD-10-CM

## 2016-01-11 NOTE — Therapy (Signed)
[facility] [address] [place], [place], [address] Phone: [phone]   Fax:  [phone]  Physical Therapy Treatment  Patient Details  Name: [name] [name] MRN: [id number] Date of Birth: 12/19/1950 Referring Provider: [name] Tat  Encounter Date: 01/11/2016      PT End of Session - 01/11/16 1444    Visit Number 5   Number of Visits 17   Date for PT Re-Evaluation 08/24/15   Authorization Type BCBS FED  75 visit limit combined   PT Start Time 0803   PT Stop Time 0846   PT Time Calculation (min) 43 min   Activity Tolerance Patient tolerated treatment well   Behavior During Therapy [facility] for tasks assessed/performed      Past Medical History  Diagnosis Date  . Headache(784.0)   . Cardiac murmur     as a child  . Streptococcal meningitis     as an infant  . Depression   .  OSA (obstructive sleep apnea) 01/17/2011    npsg 2012:  AHI 67/hr. Auto titration 2012:  Optimal pressure 12cm.   Marland Kitchen HEADACHES, HX OF 02/18/2008    Qualifier: Diagnosis of  By: Danny Lawless CMA, [place]    . APPENDECTOMY, HX OF 02/18/2008    Qualifier: Diagnosis of  By: Danny Lawless CMA, [place]    . Parkinson disease ([place]) 11/2014    Past Surgical History  Procedure Laterality Date  . Nasal sinus surgery      x 4 as a child  . Vasectomy    . Appendectomy  1967    There were no vitals filed for this visit.      Subjective Assessment - 01/11/16 0805    Subjective Feel stiff today, since it's so early in the morning.  Generally, I feel I'm getting weaker-getting dressed takes longer.   Patient Stated Goals Pt's goal for therapy is to address the shuffling with walking.   Currently in Pain? No/denies                         [facility] Adult PT Treatment/Exercise - 01/11/16 0805    Transfers   Transfers Sit to Stand;Stand to Sit   Sit to Stand 6: Modified independent (Device/Increase time);Without upper extremity assist;From  chair/3-in-1   Five time sit to stand comments  17.08   Stand to Sit 6: Modified independent (Device/Increase time);Without upper extremity assist;To chair/3-in-1   Number of Reps 10 reps  from 24" mat surface   Ambulation/Gait   Ambulation/Gait Yes   Ambulation/Gait Assistance 5: Supervision   Ambulation Distance (Feet) 505 Feet  3 minute walk; then 582 ft in 3 minute with cues   Assistive device None   Gait Pattern Decreased arm swing - right;Decreased arm swing - left;Step-through pattern;Decreased step length - right;Decreased step length - left;Narrow base of support;Trunk flexed;Decreased trunk rotation   Ambulation Surface Level;Indoor   Gait Comments Instructed patient in walking program for home.   Lumbar Exercises: Aerobic   Stationary Bike SciFit, Level 5.0, 4 extremities x 8 minutes  RPM > 65         Self Care:  Educated pt in importance of HEP performance at home regularly.  Educated patient in walking program, as well as importance of solid, consistent walking program to reinforce good walking pattern.  Provided patient with list of locations he could do level surfaces, indoor walking.  Discussed progress towards goals, plans to continue to address bed mobility  and work towards [employer].        PT Education - 01/11/16 1443    Education provided Yes   Education Details Sit<>stand transfers as part of HEP, walking program   Person(s) Educated Patient   Methods Explanation;Handout   Comprehension Verbalized understanding          PT Short Term Goals - 01/11/16 0813    PT SHORT TERM GOAL #1   Title Pt will be independent with HEP for improved bed mobility, transfers, balance and gait.  TARGET 01/15/16   Time 4   Period Weeks   Status New   PT SHORT TERM GOAL #2   Title Pt will improve 5x sit<>stand to less than or equal 14 seconds for improved transfer efficiency and safety.   Baseline 17.08 sec 01/11/16   Time 4   Period Weeks   Status Not Met   PT SHORT TERM  GOAL #3   Title Pt will improve single limb stance to at least 4 seconds bilateral lower extremities for improved stair and obstacle negotiation.   Baseline 8 seconds RLE, 1 second LLE   Time 4   Period Weeks   Status Partially Met   PT SHORT TERM GOAL #4   Title 3 minute walk test to be completed, with pt able to ambulate at least 50 ft greater than baseline, for improved endurance for long distance gait for work.   Baseline 581 ft at best 01/11/16   Time 4   Period Weeks   Status Not Met   PT SHORT TERM GOAL #5   Title Pt will verbalize at least 25% improvement in bed mobility.   Time 4   Period Weeks   Status On-going           PT Long Term Goals - 12/17/15 1705    PT LONG TERM GOAL #1   Title Pt will verbalize understanding of fall prevention/tips to reduce freezing with gait.  TARGET 02/15/16   Time 8   Period Weeks   Status New   PT LONG TERM GOAL #2   Title Pt will perform at least 8 of 10 reps of sit<>stand from 18 inch surfaces or lower, with minimal to no UE support for improved transfer efficiency and safety.   Time 8   Period Weeks   Status New   PT LONG TERM GOAL #3   Title Pt will improve Functional Gait Assessment to at least 19/30 for decreased fall risk.   Time 8   Period Weeks   PT LONG TERM GOAL #4   Title Pt will negotiate at least 4 steps with one handrail, step through pattern, modified independently without loss of balance.   Time 8   Period Weeks   Status New   PT LONG TERM GOAL #5   Title Pt will verbalize understanding of plans for community fitness upon D/C from PT.   Time 8   Period Weeks   Status New               Plan - 01/11/16 1446    Clinical Impression Statement Pt has not met LTG #2, 4.  LTG #3 partially met.  Pt is progressing towards goals, only more slowly than anticipated (pt feels his strength is less than it was, pt reports not doing exercises consistently at home, and pt has only been seen 5 PT visits since eval).  Pt  will continue to benefit from further skilled PT to address balance, functional strengthening  and gait activities for improved functional mobility.   Rehab Potential Good   PT Frequency 2x / week   PT Duration 8 weeks  plus eval   PT Treatment/Interventions ADLs/Self Care Home Management;Therapeutic exercise;Therapeutic activities;Functional mobility training;Gait training;Balance training;Neuromuscular re-education   PT Next Visit Plan Check HEP and work on bed mobility; review walking program, review sit<>stand transfers as part of HEP   PT Home Exercise Plan Walking program, speed and amplitude with mobility.   Consulted and Agree with Plan of Care Patient      Patient will benefit from skilled therapeutic intervention in order to improve the following deficits and impairments:  Abnormal gait, Decreased balance, Decreased mobility, Decreased strength, Difficulty walking, Impaired flexibility, Postural dysfunction  Visit Diagnosis: Other abnormalities of gait and mobility  Abnormal posture  Unsteadiness on feet     Problem List Patient Active Problem List   Diagnosis Date Noted  . Parkinsonian features 09/15/2015  . ED (erectile dysfunction) 09/15/2015  . MDD (major depressive disorder), recurrent episode, severe ([place]) 02/10/2014  . Nonspecific abnormal electrocardiogram (ECG) (EKG) 02/07/2014  . Non-compliant behavior 02/03/2014  . Morton's neuroma of left foot 08/07/2013  . 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 Hyperglycemia 01/26/2012  . Hypogonadism male 01/24/2012  . Syncope and collapse 01/20/2012  .  OSA (obstructive sleep apnea) 01/17/2011  . CHEST TIGHTNESS 02/18/2008    MARRIOTT,AMY W. 01/11/2016, 2:51 PM Frazier Butt., PT [facility] [address] [place], [place], [address] Phone: [phone]   Fax:  [phone]  Name: [name] [name] MRN: [id number] Date  of Birth: 1951-06-26

## 2016-01-11 NOTE — Patient Instructions (Addendum)
WALKING  Walking is a great form of exercise to increase your strength, endurance and overall fitness.  A walking program can help you start slowly and gradually build endurance as you go.  Everyone's ability is different, so each person's starting point will be different.  You do not have to follow them exactly.  The are just samples. You should simply find out what's right for you and stick to that program.   In the beginning, you'll start off walking 2-3 times a day for short distances.  As you get stronger, you'll be walking further at just 1-2 times per day.  A. You Can Walk For A Certain Length Of Time Each Day    Walk 5 minutes 3 times per day.  Increase 2 minutes every 2 days (3 times per day).  Work up to 25-30 minutes (1-2 times per day).   Example:   Day 1-2 5 minutes 3 times per day   Day 7-8 12 minutes 2-3 times per day   Day 13-14 25 minutes 1-2 times per day  B. You Can Walk For a Certain Distance Each Day     Distance can be substituted for time.    Example:   3 trips to mailbox (at road)   3 trips to corner of block   3 trips around the block  Sit to Stand Transfers:  1. Scoot out to the edge of the chair 2. Place your feet flat on the floor, shoulder width apart.  Make sure your feet are tucked just under your knees. 3. Lean forward (nose over toes) with momentum, and stand up tall with your best posture.  If you need to use your arms, use them as a quick boost up to stand. 4. If you are in a low or soft chair, you can lean back and then forward up to stand, in order to get more momentum. 5. Once you are standing, make sure you are looking ahead and standing tall.  To sit down:  1. Back up until you feel the chair behind your legs. 2. Bend at you hips, reaching  Back for you chair, if needed, then slowly squat to sit down on your chair.  REPEAT SIT TO STAND TRANSFER 10 TIMES FROM YOUR BED, 1-2 TIMES PER DAY, FOR LEG STRENGTHENING

## 2016-01-12 ENCOUNTER — Ambulatory Visit: Payer: Federal, State, Local not specified - PPO | Admitting: Occupational Therapy

## 2016-01-12 ENCOUNTER — Ambulatory Visit: Payer: Federal, State, Local not specified - PPO | Admitting: Physical Therapy

## 2016-01-12 DIAGNOSIS — R41840 Attention and concentration deficit: Secondary | ICD-10-CM

## 2016-01-12 DIAGNOSIS — R269 Unspecified abnormalities of gait and mobility: Secondary | ICD-10-CM | POA: Diagnosis not present

## 2016-01-12 DIAGNOSIS — R278 Other lack of coordination: Secondary | ICD-10-CM

## 2016-01-12 DIAGNOSIS — R293 Abnormal posture: Secondary | ICD-10-CM

## 2016-01-12 DIAGNOSIS — R29818 Other symptoms and signs involving the nervous system: Secondary | ICD-10-CM

## 2016-01-12 DIAGNOSIS — R2689 Other abnormalities of gait and mobility: Secondary | ICD-10-CM

## 2016-01-12 DIAGNOSIS — R29898 Other symptoms and signs involving the musculoskeletal system: Secondary | ICD-10-CM

## 2016-01-12 NOTE — Therapy (Signed)
[facility] [address] [place], [place], [address] Phone: [phone]   Fax:  [phone]  Occupational Therapy Treatment  Patient Details  Name: [name] [name] MRN: [id number] Date of Birth: 01-27-1951 Referring Provider: Dr. Carles Collet  Encounter Date: 01/12/2016      OT End of Session - 01/12/16 0927    Visit Number 8   Number of Visits 17   Date for OT Re-Evaluation 02/13/16   Authorization Type BCBS-75 visit limit   Authorization Time Period 4   Authorization - Visit Number 8   Authorization - Number of Visits 8   OT Start Time 0848   OT Stop Time 0930   OT Time Calculation (min) 42 min   Activity Tolerance Patient tolerated treatment well   Behavior During Therapy [facility] for tasks assessed/performed      Past Medical History  Diagnosis Date  . Headache(784.0)   . Cardiac murmur     as a child  . Streptococcal meningitis     as an infant  . Depression   .  OSA (obstructive sleep apnea) 01/17/2011    npsg 2012:  AHI 67/hr. Auto titration 2012:  Optimal pressure 12cm.   Marland Kitchen HEADACHES, HX OF 02/18/2008    Qualifier: Diagnosis of  By: Danny Lawless CMA, [place]    . APPENDECTOMY, HX OF 02/18/2008    Qualifier: Diagnosis of  By: Danny Lawless CMA, [place]    . Parkinson disease ([place]) 11/2014    Past Surgical History  Procedure Laterality Date  . Nasal sinus surgery      x 4 as a child  . Vasectomy    . Appendectomy  1967    There were no vitals filed for this visit.      Subjective Assessment - 01/12/16 0916    Subjective  Pt reports that he has trouble trying to count his exercises   Pertinent History dx PD 11/2014, hx of depression, mild cognitive impairment per recent neuropsych cognitive testing   Patient Stated Goals Pt reports increased weakness   Currently in Pain? Yes   Pain Score 3    Pain Location Back   Pain Descriptors / Indicators Aching   Pain Type Chronic pain   Pain Onset More than a month ago   Pain Frequency Intermittent   Aggravating Factors  malpositioning   Pain Relieving Factors repositioning   Multiple Pain Sites No         Treatment: Arm bike x 6 mins level 1 for conditioning, pt maintained greater than 40RPM Dynamic reaching with  trunk rotation to high surface with bilateral UE's to place items further back on the target surface and emphasis on big grasp/ release , min v.c. For large amplitude movement  tying shoe seated edge of mat with foot on footstool, pt demonstrates increased difficulty reaching his foot due to rigidity               [facility]) - 01/12/16 1040    PWR! exercises Moves in [place]! Up 10   PWR! Rock 20   PWR! Twist 10   PWR! Step 10   Comments min v.c., pt reports difficulty multi-tasking and counting exercises.               OT Short Term Goals - 01/12/16 1039    OT SHORT TERM GOAL #1   Title Pt will be independent with updated HEP    Period Weeks   Status New  OT SHORT TERM GOAL #2   Title Pt will demonstrate improved ease with feeding as evidenced by performing PPT#2  in 11 secs or less.   Baseline 14.22   Time 4   Period Weeks   Status New   OT SHORT TERM GOAL #3   Title Pt will verbalize understanding of cognitive compensation strategies/ways to promote cognitive skills prn.   Time 4   Period Weeks   Status New   OT SHORT TERM GOAL #4   Title Pt will demo incr ease with dressing as shown by ability to button/unbutton 3 buttons on table in less than 38sec.   Baseline 43.22sec   Time 4   Period Weeks   Status New   OT SHORT TERM GOAL #5   Title Pt will demonstrate increased ease with dressing as evidenced by decreasing PPT#4  by 5 secs.   Time 4   Period Weeks   Status New           OT Long Term Goals - 12/16/15 1349    OT LONG TERM GOAL #1   Title Pt will verbalize understanding of updated AE/strategies for ADLs/IADLs prn    Time 8   Status New   OT LONG TERM GOAL #2   Title Pt will  improve dressing ability as shown by improving time on PPT#4 by at least 10 sec.   Baseline 40.72 secs   Time 8   Period Weeks   Status New   OT LONG TERM GOAL #3   Title Pt will demonstrate ability to write a paragraph with 100% legibility and no significant decrease in letter size.   Time 8   Period Weeks   Status New   OT LONG TERM GOAL #4   Title Pt will verbalize understanding of ways to prevent future PD-related complications and verbalize understanding of community resources.   Time 8   Period Weeks   Status New   OT LONG TERM GOAL #5   Title -----------------------------------------------------------------------------------------------------------   OT LONG TERM GOAL #6   Title --------------------------------------------------------               Plan - 01/12/16 [id number]    Clinical Impression Statement Pt is progressing towards goals for large amplitude movments with ADLS/IADLS.   Rehab Potential Good   OT Frequency 2x / week   OT Duration 8 weeks   OT Treatment/Interventions Self-care/ADL training;Moist Heat;DME and/or AE instruction;Splinting;Patient/family education;Balance training;Therapeutic exercises;Therapeutic exercise;Therapeutic activities;Cognitive remediation/compensation;Passive range of motion;Functional Mobility Training;Neuromuscular education;Cryotherapy;Electrical Stimulation;Energy conservation;Manual Therapy   Plan reinforce large amplitude movments with functional tasks, start chacking STG's   OT Home Exercise Plan Education provided:  PWR! moves in supine (basic 4) 12/21/15, PWR! moves quadraped 12/25/15; 01/05/16 PWR! hands (basic 4), coordination HEP, keeping thinking skills sharp/ memory compensations   Consulted and Agree with Plan of Care Patient      Patient will benefit from skilled therapeutic intervention in order to improve the following deficits and impairments:  Decreased coordination, Decreased endurance, Decreased activity tolerance,  Impaired tone, Impaired UE functional use, Decreased knowledge of use of DME, Decreased balance, Decreased cognition, Decreased mobility  Visit Diagnosis: Other symptoms and signs involving the musculoskeletal system  Other symptoms and signs involving the nervous system  Other lack of coordination  Attention and concentration deficit  Abnormal posture    Problem List Patient Active Problem List   Diagnosis Date Noted  . Parkinsonian features 09/15/2015  . ED (erectile dysfunction) 09/15/2015  . MDD (major depressive  disorder), recurrent episode, severe ([place]) 02/10/2014  . Nonspecific abnormal electrocardiogram (ECG) (EKG) 02/07/2014  . Non-compliant behavior 02/03/2014  . Morton's neuroma of left foot 08/07/2013  . 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 Hyperglycemia 01/26/2012  . Hypogonadism male 01/24/2012  . Syncope and collapse 01/20/2012  .  OSA (obstructive sleep apnea) 01/17/2011  . CHEST TIGHTNESS 02/18/2008    RINE,KATHRYN 01/12/2016, 10:43 AM Theone Murdoch, OTR/L [phone] Phone: [phone] 10:43 AM 01/12/2016 [facility] [address] [place], [place], [address] Phone: [phone]   Fax:  [phone]  Name: [name] [name] MRN: [id number] Date of Birth: 16-Jul-1951

## 2016-01-12 NOTE — Therapy (Signed)
[facility] [address] [place], [place], [address] Phone: [phone]   Fax:  [phone]  Physical Therapy Treatment  Patient Details  Name: [name] [name] MRN: [id number] Date of Birth: [birth year] Referring Provider: [name] Tat  Encounter Date: 01/12/2016      PT End of Session - 01/12/16 1131    Visit Number 6   Number of Visits 17   Date for PT Re-Evaluation 08/24/15   Authorization Type BCBS FED  75 visit limit combined   PT Start Time 0934   PT Stop Time 1016   PT Time Calculation (min) 42 min   Activity Tolerance Patient tolerated treatment well   Behavior During Therapy [facility] [place] for tasks assessed/performed      Past Medical History  Diagnosis Date  . Headache(784.0)   . Cardiac murmur     as a child  . Streptococcal meningitis     as an infant  . Depression   .  OSA (obstructive sleep apnea) 01/17/2011    npsg 2012:  AHI 67/hr. Auto titration 2012:  Optimal pressure 12cm.   Marland Kitchen HEADACHES, HX OF 02/18/2008    Qualifier: Diagnosis of  By: Danny Lawless CMA, [place]    . APPENDECTOMY, HX OF 02/18/2008    Qualifier: Diagnosis of  By: Danny Lawless CMA, [place]    . Parkinson disease ([place]) 11/2014    Past Surgical History  Procedure Laterality Date  . Nasal sinus surgery      x 4 as a child  . Vasectomy    . Appendectomy  1967    There were no vitals filed for this visit.      Subjective Assessment - 01/12/16 0935    Subjective Still feels stiff.  Needs to replace mattress.   Patient Stated Goals Pt's goal for therapy is to address the shuffling with walking.   Currently in Pain? Yes   Pain Score 1    Pain Location Back   Pain Orientation Left;Lower   Pain Descriptors / Indicators Aching   Pain Type Chronic pain   Pain Onset More than a month ago   Pain Frequency Intermittent   Aggravating Factors  malpositioning   Pain Relieving Factors repositioning            [facility] Adult PT  Treatment/Exercise - 01/12/16 0001    Bed Mobility   Bed Mobility Rolling Right;Rolling Left;Right Sidelying to Sit;Sit to Sidelying Right   Rolling Right 5: Supervision   Rolling Left 5: Supervision   Right Sidelying to Sit 5: Supervision   Supine to Sit 5: Supervision   Sit to Supine 5: Supervision   Sit to Supine - Details (indicate cue type and reason) pt climbs into bed on hands and knees then transitions into sidelying   Sit to Sidelying Right 5: Supervision           [facility]) - 01/12/16 1125    PWR! exercises Moves in supine;Moves in standing   PWR! Up 20   PWR! Rock 20   PWR! Twist 20   PWR! Step 10   Comments in supine with cues for intensity and technique   PWR! Up 20   PWR! Rock 20   PWR! Twist 20   PWR Step 20   Comments in standing with cues for intensity and technique;LOB x 1 but recovered without assistance             PT Education - 01/12/16 1129    Education provided Yes  Education Details Importance of HEP, bringing in previous HEP and will help pt prioritize exercises, reviewed walking program   Person(s) Educated Patient   Methods Explanation;Demonstration   Comprehension Verbalized understanding;Need further instruction          PT Short Term Goals - 01/12/16 1130    PT SHORT TERM GOAL #1   Title Pt will be independent with HEP for improved bed mobility, transfers, balance and gait.  TARGET 01/15/16   Baseline needs cues for intensity   Time 4   Period Weeks   Status Partially Met   PT SHORT TERM GOAL #2   Title Pt will improve 5x sit<>stand to less than or equal 14 seconds for improved transfer efficiency and safety.   Baseline 17.08 sec 01/11/16   Time 4   Period Weeks   Status Not Met   PT SHORT TERM GOAL #3   Title Pt will improve single limb stance to at least 4 seconds bilateral lower extremities for improved stair and obstacle negotiation.   Baseline 8 seconds RLE, 1 second LLE   Time 4   Period Weeks   Status Partially Met    PT SHORT TERM GOAL #4   Title 3 minute walk test to be completed, with pt able to ambulate at least 50 ft greater than baseline, for improved endurance for long distance gait for work.   Baseline 581 ft at best 01/11/16   Time 4   Period Weeks   Status Not Met   PT SHORT TERM GOAL #5   Title Pt will verbalize at least 25% improvement in bed mobility.   Baseline pt reports still having difficulty and thinks it is due to his mattress   Time 4   Period Weeks   Status Not Met           PT Long Term Goals - 12/17/15 1705    PT LONG TERM GOAL #1   Title Pt will verbalize understanding of fall prevention/tips to reduce freezing with gait.  TARGET 02/15/16   Time 8   Period Weeks   Status New   PT LONG TERM GOAL #2   Title Pt will perform at least 8 of 10 reps of sit<>stand from 18 inch surfaces or lower, with minimal to no UE support for improved transfer efficiency and safety.   Time 8   Period Weeks   Status New   PT LONG TERM GOAL #3   Title Pt will improve Functional Gait Assessment to at least 19/30 for decreased fall risk.   Time 8   Period Weeks   PT LONG TERM GOAL #4   Title Pt will negotiate at least 4 steps with one handrail, step through pattern, modified independently without loss of balance.   Time 8   Period Weeks   Status New   PT LONG TERM GOAL #5   Title Pt will verbalize understanding of plans for community fitness upon D/C from PT.   Time 8   Period Weeks   Status New               Plan - 01/12/16 1135    Clinical Impression Statement STG# 1 partially met.  STG# 5 unmet.  Continues to need cues for intensity.  Reporting difficulty with counting exercises while maintaining proper technique.  Unsure if pt is doing exercises consistently at home.  Discussed helping pt prioritize exercises for home use.  Continue PT per POC.   Rehab Potential Good  PT Frequency 2x / week   PT Duration 8 weeks  plus eval   PT Treatment/Interventions ADLs/Self Care  Home Management;Therapeutic exercise;Therapeutic activities;Functional mobility training;Gait training;Balance training;Neuromuscular re-education   PT Next Visit Plan Review pt's handouts of HEP if he brings them in;PWR! in all/any position   PT Home Exercise Plan Walking program, speed and amplitude with mobility.   Consulted and Agree with Plan of Care Patient      Patient will benefit from skilled therapeutic intervention in order to improve the following deficits and impairments:  Abnormal gait, Decreased balance, Decreased mobility, Decreased strength, Difficulty walking, Impaired flexibility, Postural dysfunction  Visit Diagnosis: Other abnormalities of gait and mobility  Abnormal posture     Problem List Patient Active Problem List   Diagnosis Date Noted  . Parkinsonian features 09/15/2015  . ED (erectile dysfunction) 09/15/2015  . MDD (major depressive disorder), recurrent episode, severe ([place]) 02/10/2014  . Nonspecific abnormal electrocardiogram (ECG) (EKG) 02/07/2014  . Non-compliant behavior 02/03/2014  . Morton's neuroma of left foot 08/07/2013  . 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 Hyperglycemia 01/26/2012  . Hypogonadism male 01/24/2012  . Syncope and collapse 01/20/2012  .  OSA (obstructive sleep apnea) 01/17/2011  . CHEST TIGHTNESS 02/18/2008    Narda Bonds 01/12/2016, 11:46 AM  [facility] [address] [place], [place], [address] Phone: [phone]   Fax:  [phone]  Name: [name] [name] MRN: [id number] Date of Birth: May 07, 1951    [facility] [place], [place] 01/12/2016 11:46 AM Phone: [phone] Fax: [phone]

## 2016-01-14 ENCOUNTER — Ambulatory Visit: Payer: Self-pay | Admitting: Physical Therapy

## 2016-01-14 ENCOUNTER — Ambulatory Visit: Payer: Federal, State, Local not specified - PPO | Admitting: *Deleted

## 2016-01-14 DIAGNOSIS — R471 Dysarthria and anarthria: Secondary | ICD-10-CM

## 2016-01-14 DIAGNOSIS — R269 Unspecified abnormalities of gait and mobility: Secondary | ICD-10-CM | POA: Diagnosis not present

## 2016-01-14 DIAGNOSIS — R490 Dysphonia: Secondary | ICD-10-CM

## 2016-01-14 NOTE — Addendum Note (Signed)
Addended by: Vianne Bulls D on: 01/14/2016 01:50 PM   Modules accepted: Orders

## 2016-01-14 NOTE — Patient Instructions (Signed)
Discussed continued attention to vocal quality and hydration.

## 2016-01-14 NOTE — Therapy (Signed)
[facility] [address] [place], [place], [address] Phone: [phone]   Fax:  [phone]  Speech Language Pathology Treatment  Patient Details  Name: [name] [name] MRN: [id number] Date of Birth: [birth year] Referring Provider: Dr. [name] Tat  Encounter Date: 01/14/2016      End of Session - 01/14/16 0931    Visit Number 8   Number of Visits 17   Date for SLP Re-Evaluation 02/10/16   Authorization Type 25   Authorization - Visit Number 8   Authorization - Number of Visits 25   SLP Start Time [phone]   SLP Stop Time  0928   SLP Time Calculation (min) 45 min   Activity Tolerance Patient tolerated treatment well      Past Medical History  Diagnosis Date  . Headache(784.0)   . Cardiac murmur     as a child  . Streptococcal meningitis     as an infant  . Depression   .  OSA (obstructive sleep apnea) 01/17/2011    npsg 2012:  AHI 67/hr. Auto titration 2012:  Optimal pressure 12cm.   Marland Kitchen HEADACHES, HX OF 02/18/2008    Qualifier: Diagnosis of  By: Danny Lawless CMA, [place]    . APPENDECTOMY, HX OF 02/18/2008    Qualifier: Diagnosis of  By: Danny Lawless CMA, [place]    . Parkinson disease ([place]) 11/2014    Past Surgical History  Procedure Laterality Date  . Nasal sinus surgery      x 4 as a child  . Vasectomy    . Appendectomy  1967    There were no vitals filed for this visit.      Subjective Assessment - 01/14/16 0847    Subjective "I'm feeling stiff. Working on getting louder."   Currently in Pain? No/denies               ADULT SLP TREATMENT - 01/14/16 0900    General Information   Behavior/Cognition Alert;Cooperative;Pleasant mood   Cognitive-Linquistic Treatment   Treatment focused on Dysarthria;Voice   Skilled Treatment Loud /a/ average 95 dB with excellent vocal quality. Pt requested water when vocal quality become hoarse. Structured sentence level activity completed at mid-70dBs. Conversational  speech maintained in the mid-60's with min- mod cueing and occasional sips of water.    Assessment / Recommendations / Plan   Plan Continue with current plan of care          SLP Education - 01/14/16 0930    Education provided Yes   Education Details Continued attention to vocal quality and hydration.   Person(s) Educated Patient   Methods Explanation   Comprehension Verbalized understanding          SLP Short Term Goals - 01/14/16 1132    SLP SHORT TERM GOAL #3   Title Pt will utlize compensations to reduce coughing on his saliva while at rest, reporting coughing on saliva reduced by 25% subjectively.   Status Unable to assess          SLP Long Term Goals - 01/14/16 1133    SLP LONG TERM GOAL #1   Title pt to maintain average 70dB in 15 minutes mod complex conversation outside [place] room over two sessions   Time 4   Period Weeks   Status On-going   SLP LONG TERM GOAL #2   Title Pt will maintain an average of 70dBfor modcomplex conversation over 12 minutes with rare minimal assistance over 2 sessions    Time 4  Period Weeks   Status On-going          Plan - 01/14/16 1131    Clinical Impression Statement Pt maintaining average of 70dB duirng structured speech tasks and simple conversation with occasional min visual and  verbal cues to maintain adeqaute vocal intensity and quality.. Continue skilled ST to maximize intellgibility.   Speech Therapy Frequency 2x / week   Treatment/Interventions SLP instruction and feedback;Internal/external aids;Patient/family education;Functional tasks;Compensatory strategies;Aspiration precaution training   Potential to Achieve Goals Good   Consulted and Agree with Plan of Care Patient      Patient will benefit from skilled therapeutic intervention in order to improve the following deficits and impairments:   Dysarthria  Dysphonia    Problem List Patient Active Problem List   Diagnosis Date Noted  . Parkinsonian features  09/15/2015  . ED (erectile dysfunction) 09/15/2015  . MDD (major depressive disorder), recurrent episode, severe ([place]) 02/10/2014  . Nonspecific abnormal electrocardiogram (ECG) (EKG) 02/07/2014  . Non-compliant behavior 02/03/2014  . Morton's neuroma of left foot 08/07/2013  . 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 Hyperglycemia 01/26/2012  . Hypogonadism male 01/24/2012  . Syncope and collapse 01/20/2012  .  OSA (obstructive sleep apnea) 01/17/2011  . CHEST TIGHTNESS 02/18/2008    Vinetta Bergamo MA, CCC-SLP 01/14/2016, 11:36 AM  [facility] [address] [place], [place], [address] Phone: [phone]   Fax:  [phone]   Name: [name] [name] MRN: [id number] Date of Birth: 05-Oct-1950

## 2016-01-19 ENCOUNTER — Ambulatory Visit: Payer: Federal, State, Local not specified - PPO | Admitting: Occupational Therapy

## 2016-01-19 ENCOUNTER — Ambulatory Visit: Payer: Federal, State, Local not specified - PPO | Admitting: Physical Therapy

## 2016-01-19 ENCOUNTER — Ambulatory Visit: Payer: Federal, State, Local not specified - PPO

## 2016-01-19 DIAGNOSIS — R4701 Aphasia: Secondary | ICD-10-CM

## 2016-01-19 DIAGNOSIS — R293 Abnormal posture: Secondary | ICD-10-CM

## 2016-01-19 DIAGNOSIS — R471 Dysarthria and anarthria: Secondary | ICD-10-CM

## 2016-01-19 DIAGNOSIS — R269 Unspecified abnormalities of gait and mobility: Secondary | ICD-10-CM | POA: Diagnosis not present

## 2016-01-19 DIAGNOSIS — R1310 Dysphagia, unspecified: Secondary | ICD-10-CM

## 2016-01-19 DIAGNOSIS — R29898 Other symptoms and signs involving the musculoskeletal system: Secondary | ICD-10-CM

## 2016-01-19 DIAGNOSIS — R278 Other lack of coordination: Secondary | ICD-10-CM

## 2016-01-19 DIAGNOSIS — R29818 Other symptoms and signs involving the nervous system: Secondary | ICD-10-CM

## 2016-01-19 NOTE — Therapy (Signed)
[facility] [address] [place], [place], [address] Phone: [phone]   Fax:  [phone]  Speech Language Pathology Treatment  Patient Details  Name: [name] [name] MRN: [id number] Date of Birth: 16-Mar-1951 Referring Provider: Dr. [name] Tat  Encounter Date: 01/19/2016      End of Session - 01/19/16 1003    Visit Number 9   Number of Visits 17   Date for SLP Re-Evaluation 02/10/16   Authorization Type 25   Authorization - Visit Number 9   Authorization - Number of Visits 25   SLP Start Time 0932   SLP Stop Time  [id number]   SLP Time Calculation (min) 43 min   Activity Tolerance Patient tolerated treatment well      Past Medical History  Diagnosis Date  . Headache(784.0)   . Cardiac murmur     as a child  . Streptococcal meningitis     as an infant  . Depression   .  OSA (obstructive sleep apnea) 01/17/2011    npsg 2012:  AHI 67/hr. Auto titration 2012:  Optimal pressure 12cm.   Marland Kitchen HEADACHES, HX OF 02/18/2008    Qualifier: Diagnosis of  By: Danny Lawless CMA, [place]    . APPENDECTOMY, HX OF 02/18/2008    Qualifier: Diagnosis of  By: Danny Lawless CMA, [place]    . Parkinson disease ([place]) 11/2014    Past Surgical History  Procedure Laterality Date  . Nasal sinus surgery      x 4 as a child  . Vasectomy    . Appendectomy  1967    There were no vitals filed for this visit.      Subjective Assessment - 01/19/16 0940    Subjective Pt with volume at 69dB average prior to loud /a/. "How's my volume?"               ADULT SLP TREATMENT - 01/19/16 0941    General Information   Behavior/Cognition Alert;Cooperative;Pleasant mood   Treatment Provided   Treatment provided Cognitive-Linquistic   Cognitive-Linquistic Treatment   Treatment focused on Dysarthria;Voice   Skilled Treatment Pt's conversation prior to loud /a/ was average 69dB (simple to mod complex). Loud /a/ to recalibrate pt's voice for WNL  conversational loudness with average 91dB and effort states as 8.5/10 (10=most effort possible). Pt with what appeared to be pharyngeal congestion, routinely. In semi-structured multisentence tasks, pt with average 71 dB.     Assessment / Recommendations / Plan   Plan Continue with current plan of care   Progression Toward Goals   Progression toward goals Progressing toward goals            SLP Short Term Goals - 01/19/16 1011    SLP SHORT TERM GOAL #3   Title Pt will utlize compensations to reduce coughing on his saliva while at rest, reporting coughing on saliva reduced by 25% subjectively.   Status Achieved          SLP Long Term Goals - 01/19/16 1011    SLP LONG TERM GOAL #1   Title pt to maintain average 70dB in 15 minutes mod complex conversation outside [place] room over two sessions   Time 4   Period Weeks   Status On-going   SLP LONG TERM GOAL #2   Title Pt will maintain an average of 70dBfor modcomplex conversation over 12 minutes with rare minimal assistance over 2 sessions    Time 4   Period Weeks   Status On-going  Plan - 01/19/16 1004    Clinical Impression Statement Pt maintaining average above 70dB duirng semi-structured speech tasks and simple to mod complex conversation with rare min verbal cues to maintain adeqaute volume.. Continue skilled ST to maximize intellgibility.   Speech Therapy Frequency 2x / week   Duration 4 weeks   Treatment/Interventions SLP instruction and feedback;Internal/external aids;Patient/family education;Functional tasks;Compensatory strategies;Aspiration precaution training   Potential to Achieve Goals Good   Potential Considerations Severity of impairments      Patient will benefit from skilled therapeutic intervention in order to improve the following deficits and impairments:   Dysarthria and anarthria  Dysphagia  Aphasia    Problem List Patient Active Problem List   Diagnosis Date Noted  . Parkinsonian  features 09/15/2015  . ED (erectile dysfunction) 09/15/2015  . MDD (major depressive disorder), recurrent episode, severe ([place]) 02/10/2014  . Nonspecific abnormal electrocardiogram (ECG) (EKG) 02/07/2014  . Non-compliant behavior 02/03/2014  . Morton's neuroma of left foot 08/07/2013  . 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 Hyperglycemia 01/26/2012  . Hypogonadism male 01/24/2012  . Syncope and collapse 01/20/2012  .  OSA (obstructive sleep apnea) 01/17/2011  . CHEST TIGHTNESS 02/18/2008    Garrell Flagg ,MS, CCC-SLP  01/19/2016, 10:14 AM  [facility] [address] [place], [place], [address] Phone: [phone]   Fax:  [phone]   Name: [name] [name] MRN: [id number] Date of Birth: Aug 23, 1951

## 2016-01-19 NOTE — Patient Instructions (Signed)
Keeping Thinking Skills Sharp: 1. Jigsaw puzzles 2. Card/board games 3. Talking on the phone/social events 4. Lumosity.com 5. Online games 6. Word serches/crossword puzzles 7.  Logic puzzles 8. Aerobic exercise (stationary bike) 9. Eating balanced diet (fruits & veggies) 10. Drink water 11. Try something new--new recipe, hobby 12. Crafts 13. Do a variety of activities that are challenging 14. Add cognitive activities to walking/exercising (think of animal/food/city with each letter of the alphabet, counting backwards, thinking of as many vegetables as you can, etc.).--Only do this  If safe (no freezing/falls). Memory Compensation Strategies  1. Use "WARM" strategy.  W= write it down  A= associate it  R= repeat it  M= make a mental note  2.   You can keep a Memory Notebook.  Use a 3-ring notebook with sections for the following: calendar, important names and phone numbers,  medications, doctors' names/phone numbers, lists/reminders, and a section to journal what you did  each day.   3.    Use a calendar to write appointments down.  4.    Write yourself a schedule for the day.  This can be placed on the calendar or in a separate section of the Memory Notebook.  Keeping a  regular schedule can help memory.  5.    Use medication organizer with sections for each day or morning/evening pills.  You may need help loading it  6.    Keep a basket, or pegboard by the door.  Place items that you need to take out with you in the basket or on the pegboard.  You may also want to  include a message board for reminders.  7.    Use sticky notes.  Place sticky notes with reminders in a place where the task is performed.  For example: " turn off the  stove" placed by the stove, "lock the door" placed on the door at eye level, " take your medications" on  the bathroom mirror or by the place where you normally take your medications.  8.    Use alarms/timers.  Use while cooking to remind yourself  to check on food or as a reminder to take your medicine, or as a  reminder to make a call, or as a reminder to perform another task, etc.  

## 2016-01-19 NOTE — Therapy (Signed)
[address] [place], [place], [address] Phone: [phone]   Fax:  [phone]  Occupational Therapy Treatment  Patient Details  Name: [name] [name] MRN: [id number] Date of Birth: 11-05-1950 Referring Provider: Dr. Carles Collet  Encounter Date: 01/19/2016      OT End of Session - 01/19/16 1050    Visit Number 9   Number of Visits 17   Date for OT Re-Evaluation 02/13/16   Authorization Type [id number] visit limit   Authorization - Visit Number 9   Authorization - Number of Visits 10   OT Start Time 0849   OT Stop Time 0930   OT Time Calculation (min) 41 min   Activity Tolerance Patient tolerated treatment well   Behavior During Therapy [facility] for tasks assessed/performed      Past Medical History  Diagnosis Date  . Headache(784.0)   . Cardiac murmur     as a child  . Streptococcal meningitis     as an infant  . Depression   .  OSA (obstructive sleep apnea) 01/17/2011    npsg 2012:  AHI 67/hr. Auto titration 2012:  Optimal pressure 12cm.   Marland Kitchen HEADACHES, HX OF 02/18/2008    Qualifier: Diagnosis of  By: Danny Lawless CMA, [place]    . APPENDECTOMY, HX OF 02/18/2008    Qualifier: Diagnosis of  By: Danny Lawless CMA, [place]    . Parkinson disease ([place]) 11/2014    Past Surgical History  Procedure Laterality Date  . Nasal sinus surgery      x 4 as a child  . Vasectomy    . Appendectomy  1967    There were no vitals filed for this visit.      Subjective Assessment - 01/19/16 1047    Subjective  Pt reports neck and back tightness   Pertinent History dx PD 11/2014, hx of depression, mild cognitive impairment per recent neuropsych cognitive testing   Patient Stated Goals Pt reports increased weakness   Currently in Pain? Yes   Pain Score 3    Pain Location Back  neck   Pain Descriptors / Indicators Aching   Pain Type Chronic pain   Pain Onset More than a month ago   Pain Frequency Intermittent   Aggravating  Factors  malposistioning   Pain Relieving Factors reposistioning/ exercise   Multiple Pain Sites No          Pt reports significant tightness today. Warm up on Arm bike x 6 mins level, with pt maintaining 40 rpm for conditioning.  PWR! Hands followed by fastening / unfastening buttons, min v.c. For larger amplitude movements and use of flicks/ PWR! hands Reviewed strategies to keep thinking skills sharp/ memory strategies. Pt to bring in his exercise booklet tomorrow.               PWR [facility]) - 01/19/16 1051    PWR! exercises Moves in [place]! Up 20   PWR! Rock 10   PWR! Twist 10   Comments min v.c. for technique               OT Short Term Goals - 01/19/16 [id number]    OT SHORT TERM GOAL #1   Title Pt will be independent with updated HEP    Period Weeks   Status New   OT SHORT TERM GOAL #2   Title Pt will demonstrate improved ease with feeding as evidenced by performing PPT#2  in 11 secs or less.  Baseline 14.22   Time 4   Period Weeks   Status On-going   OT SHORT TERM GOAL #3   Title Pt will verbalize understanding of cognitive compensation strategies/ways to promote cognitive skills prn.   Time 4   Period Weeks   Status On-going   OT SHORT TERM GOAL #4   Title Pt will demo incr ease with dressing as shown by ability to button/unbutton 3 buttons on table in less than 38sec.   Baseline 43.22sec   Time 4   Period Weeks   Status On-going  39.97 secs   OT SHORT TERM GOAL #5   Title Pt will demonstrate increased ease with dressing as evidenced by decreasing PPT#4  by 5 secs.   Time 4   Period Weeks   Status On-going           OT Long Term Goals - 12/16/15 1349    OT LONG TERM GOAL #1   Title Pt will verbalize understanding of updated AE/strategies for ADLs/IADLs prn    Time 8   Status New   OT LONG TERM GOAL #2   Title Pt will improve dressing ability as shown by improving time on PPT#4 by at least 10 sec.   Baseline 40.72 secs   Time  8   Period Weeks   Status New   OT LONG TERM GOAL #3   Title Pt will demonstrate ability to write a paragraph with 100% legibility and no significant decrease in letter size.   Time 8   Period Weeks   Status New   OT LONG TERM GOAL #4   Title Pt will verbalize understanding of ways to prevent future PD-related complications and verbalize understanding of community resources.   Time 8   Period Weeks   Status New   OT LONG TERM GOAL #5   Title -----------------------------------------------------------------------------------------------------------   OT LONG TERM GOAL #6   Title --------------------------------------------------------               Plan - 01/19/16 [id number]    Clinical Impression Statement Pt is progressing towards goals. He responds well to cueing for larger amplitude movements and pt reports that the exercises help his back and neck tightness.   Rehab Potential Good   OT Frequency 2x / week   OT Duration 8 weeks   OT Treatment/Interventions Self-care/ADL training;Moist Heat;DME and/or AE instruction;Splinting;Patient/family education;Balance training;Therapeutic exercises;Therapeutic exercise;Therapeutic activities;Cognitive remediation/compensation;Passive range of motion;Functional Mobility Training;Neuromuscular education;Cryotherapy;Electrical Stimulation;Energy conservation;Manual Therapy   Plan practice donning/ doffing jacket, check short term goals   OT Home Exercise Plan Education provided:  PWR! moves in supine (basic 4) 12/21/15, PWR! moves quadraped 12/25/15; 01/05/16 PWR! hands (basic 4), coordination HEP, keeping thinking skills sharp/ memory compensations   Consulted and Agree with Plan of Care Patient      Patient will benefit from skilled therapeutic intervention in order to improve the following deficits and impairments:  Decreased coordination, Decreased endurance, Decreased activity tolerance, Impaired tone, Impaired UE functional use, Decreased  knowledge of use of DME, Decreased balance, Decreased cognition, Decreased mobility  Visit Diagnosis: Other lack of coordination  Other symptoms and signs involving the musculoskeletal system  Other symptoms and signs involving the nervous system  Abnormal posture    Problem List Patient Active Problem List   Diagnosis Date Noted  . Parkinsonian features 09/15/2015  . ED (erectile dysfunction) 09/15/2015  . MDD (major depressive disorder), recurrent episode, severe ([place]) 02/10/2014  . Nonspecific abnormal electrocardiogram (ECG) (EKG) 02/07/2014  . Non-compliant  behavior 02/03/2014  . Morton's neuroma of left foot 08/07/2013  . 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 Hyperglycemia 01/26/2012  . Hypogonadism male 01/24/2012  . Syncope and collapse 01/20/2012  .  OSA (obstructive sleep apnea) 01/17/2011  . CHEST TIGHTNESS 02/18/2008    RINE,KATHRYN 01/19/2016, 10:53 AM Theone Murdoch, OTR/L [phone] Phone: [phone] 10:53 AM 01/19/2016 [facility] [address] [place], [place], [address] Phone: [phone]   Fax:  [phone]  Name: [name] [name] MRN: [id number] Date of Birth: 07/16/1951

## 2016-01-20 ENCOUNTER — Ambulatory Visit: Payer: Federal, State, Local not specified - PPO | Admitting: Physical Therapy

## 2016-01-20 ENCOUNTER — Ambulatory Visit: Payer: Federal, State, Local not specified - PPO

## 2016-01-20 ENCOUNTER — Ambulatory Visit: Payer: Federal, State, Local not specified - PPO | Admitting: Occupational Therapy

## 2016-01-20 DIAGNOSIS — R471 Dysarthria and anarthria: Secondary | ICD-10-CM

## 2016-01-20 DIAGNOSIS — R269 Unspecified abnormalities of gait and mobility: Secondary | ICD-10-CM | POA: Diagnosis not present

## 2016-01-20 DIAGNOSIS — R293 Abnormal posture: Secondary | ICD-10-CM

## 2016-01-20 DIAGNOSIS — R1310 Dysphagia, unspecified: Secondary | ICD-10-CM

## 2016-01-20 DIAGNOSIS — R29898 Other symptoms and signs involving the musculoskeletal system: Secondary | ICD-10-CM

## 2016-01-20 DIAGNOSIS — R29818 Other symptoms and signs involving the nervous system: Secondary | ICD-10-CM

## 2016-01-20 DIAGNOSIS — R278 Other lack of coordination: Secondary | ICD-10-CM

## 2016-01-20 NOTE — Therapy (Signed)
[facility] [address] [place], [place], [address] Phone: [phone]   Fax:  [phone]  Occupational Therapy Treatment  Patient Details  Name: [name] [name] MRN: [id number] Date of Birth: May 29, 1951 Referring Provider: Dr. Carles Collet  Encounter Date: 01/20/2016      OT End of Session - 01/20/16 0940    Visit Number 10   Number of Visits 17   Authorization Type BCBS-75 visit limit   Authorization - Visit Number 10   OT Start Time 0936   OT Stop Time 1015   OT Time Calculation (min) 39 min   Activity Tolerance Patient tolerated treatment well   Behavior During Therapy [facility] for tasks assessed/performed      Past Medical History  Diagnosis Date  . Headache(784.0)   . Cardiac murmur     as a child  . Streptococcal meningitis     as an infant  . Depression   .  OSA (obstructive sleep apnea) 01/17/2011    npsg 2012:  AHI 67/hr. Auto titration 2012:  Optimal pressure 12cm.   Marland Kitchen HEADACHES, HX OF 02/18/2008    Qualifier: Diagnosis of  By: Danny Lawless CMA, [place]    . APPENDECTOMY, HX OF 02/18/2008    Qualifier: Diagnosis of  By: Danny Lawless CMA, [place]    . Parkinson disease ([place]) 11/2014    Past Surgical History  Procedure Laterality Date  . Nasal sinus surgery      x 4 as a child  . Vasectomy    . Appendectomy  1967    There were no vitals filed for this visit.      Subjective Assessment - 01/19/16 1047    Subjective  Pt reports neck and back tightness   Pertinent History dx PD 11/2014, hx of depression, mild cognitive impairment per recent neuropsych cognitive testing   Patient Stated Goals Pt reports increased weakness   Currently in Pain? Yes   Pain Score 3    Pain Location Back  neck   Pain Descriptors / Indicators Aching   Pain Type Chronic pain   Pain Onset More than a month ago   Pain Frequency Intermittent   Aggravating Factors  malposistioning   Pain Relieving Factors reposistioning/ exercise   Multiple Pain Sites No         Arm bike x 6 mins level 1 for conditioning, pt maintained 45-53 RPM Donning/ doffing jacket using adapted techniques x 4 reps after therapist demonstration with min-mod v.c for larger amplitude movements. Pt will benefit from reinforcement.          Reviewed PWR! step in quadraped x 10 reps, pt returned demonstration.      PWR [facility]) - 01/19/16 1051    PWR! exercises Moves in prone   PWR! Up 10   PWR! Rock 10   PWR! Twist, PWR! step 10 each   Comments min v.c. for technique               OT Short Term Goals - 01/19/16 [id number]    OT SHORT TERM GOAL #1   Title Pt will be independent with updated HEP    Period Weeks   Status New   OT SHORT TERM GOAL #2   Title Pt will demonstrate improved ease with feeding as evidenced by performing PPT#2  in 11 secs or less.   Baseline 14.22   Time 4   Period Weeks   Status On-going   OT SHORT TERM GOAL #3   Title  Pt will verbalize understanding of cognitive compensation strategies/ways to promote cognitive skills prn.   Time 4   Period Weeks   Status On-going   OT SHORT TERM GOAL #4   Title Pt will demo incr ease with dressing as shown by ability to button/unbutton 3 buttons on table in less than 38sec.   Baseline 43.22sec   Time 4   Period Weeks   Status On-going  39.97 secs   OT SHORT TERM GOAL #5   Title Pt will demonstrate increased ease with dressing as evidenced by decreasing PPT#4  by 5 secs.   Time 4   Period Weeks   Status On-going           OT Long Term Goals - 12/16/15 1349    OT LONG TERM GOAL #1   Title Pt will verbalize understanding of updated AE/strategies for ADLs/IADLs prn    Time 8   Status New   OT LONG TERM GOAL #2   Title Pt will improve dressing ability as shown by improving time on PPT#4 by at least 10 sec.   Baseline 40.72 secs   Time 8   Period Weeks   Status New   OT LONG TERM GOAL #3   Title Pt will demonstrate ability to write a paragraph with  100% legibility and no significant decrease in letter size.   Time 8   Period Weeks   Status New   OT LONG TERM GOAL #4   Title Pt will verbalize understanding of ways to prevent future PD-related complications and verbalize understanding of community resources.   Time 8   Period Weeks   Status New   OT LONG TERM GOAL #5   Title -----------------------------------------------------------------------------------------------------------   OT LONG TERM GOAL #6   Title --------------------------------------------------------               Plan - 01/19/16 [id number]    Clinical Impression Statement Pt is progressing towards goals. He responds well to cueing for larger amplitude movements and pt reports that the exercises help his back and neck tightness.   Rehab Potential Good   OT Frequency 2x / week   OT Duration 8 weeks   OT Treatment/Interventions Self-care/ADL training;Moist Heat;DME and/or AE instruction;Splinting;Patient/family education;Balance training;Therapeutic exercises;Therapeutic exercise;Therapeutic activities;Cognitive remediation/compensation;Passive range of motion;Functional Mobility Training;Neuromuscular education;Cryotherapy;Electrical Stimulation;Energy conservation;Manual Therapy   Plan practice donning/ doffing jacket, check short term goals   OT Home Exercise Plan Education provided:  PWR! moves in supine (basic 4) 12/21/15, PWR! moves quadraped 12/25/15; 01/05/16 PWR! hands (basic 4), coordination HEP, keeping thinking skills sharp/ memory compensations   Consulted and Agree with Plan of Care Patient      Patient will benefit from skilled therapeutic intervention in order to improve the following deficits and impairments:     Visit Diagnosis: Other lack of coordination  Other symptoms and signs involving the nervous system  Other symptoms and signs involving the musculoskeletal system  Abnormal posture    Problem List Patient Active Problem List    Diagnosis Date Noted  . Parkinsonian features 09/15/2015  . ED (erectile dysfunction) 09/15/2015  . MDD (major depressive disorder), recurrent episode, severe ([place]) 02/10/2014  . Nonspecific abnormal electrocardiogram (ECG) (EKG) 02/07/2014  . Non-compliant behavior 02/03/2014  . Morton's neuroma of left foot 08/07/2013  . 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 Hyperglycemia 01/26/2012  . Hypogonadism male 01/24/2012  . Syncope and collapse 01/20/2012  .  OSA (obstructive sleep apnea) 01/17/2011  . CHEST TIGHTNESS  02/18/2008    Carlo Lorson 01/20/2016, 1:28 PM Theone Murdoch, OTR/L [phone] Phone: [phone] 1:28 PM 01/20/2016 [facility] [address] [place], [place], [address] Phone: [phone]   Fax:  [phone]  Name: TAYLIN BOSSEN MRN: [id number] Date of Birth: 07-Jul-1951

## 2016-01-20 NOTE — Therapy (Signed)
[facility] [address] [place], [place], [address] Phone: [phone]   Fax:  [phone]  Speech Language Pathology Treatment  Patient Details  Name: [name] [name] MRN: [id number] Date of Birth: [birth year] Referring Provider: Dr. [name] Tat  Encounter Date: 01/20/2016      End of Session - 01/20/16 1353    Visit Number 10   Number of Visits 17   Date for SLP Re-Evaluation 02/10/16   Authorization Type 25   Authorization - Visit Number 10   Authorization - Number of Visits 25   SLP Start Time [id number]   SLP Stop Time  0932   SLP Time Calculation (min) 40 min   Activity Tolerance Patient tolerated treatment well      Past Medical History  Diagnosis Date  . Headache(784.0)   . Cardiac murmur     as a child  . Streptococcal meningitis     as an infant  . Depression   .  OSA (obstructive sleep apnea) 01/17/2011    npsg 2012:  AHI 67/hr. Auto titration 2012:  Optimal pressure 12cm.   Marland Kitchen HEADACHES, HX OF 02/18/2008    Qualifier: Diagnosis of  By: Danny Lawless CMA, [place]    . APPENDECTOMY, HX OF 02/18/2008    Qualifier: Diagnosis of  By: Danny Lawless CMA, [place]    . Parkinson disease ([place]) 11/2014    Past Surgical History  Procedure Laterality Date  . Nasal sinus surgery      x 4 as a child  . Vasectomy    . Appendectomy  1967    There were no vitals filed for this visit.      Subjective Assessment - 01/20/16 0912    Subjective Pt brought organizer/planner/memory notebook (3-ring) to therapy today.   Currently in Pain? No/denies               ADULT SLP TREATMENT - 01/20/16 0913    General Information   Behavior/Cognition Alert;Cooperative;Pleasant mood   Treatment Provided   Treatment provided Cognitive-Linquistic   Cognitive-Linquistic Treatment   Treatment focused on Dysarthria;Voice   Skilled Treatment Pt with quesiton about how to improve cognition. SLP provided education and discussed with  pt for approx 10 minutes re: Constant Therapy. Pt's loud /a/ to recalibrate conversational loudness was measured at average 92dB. Multiple sentence stimuli were completed with average 69dB without cues and 71dB with min cues rarely. Conversation (simple to mod complex) of 7 minutes x2 req'd rare min SLP cues for loudness to remain average 70dB.   Assessment / Recommendations / Plan   Plan Continue with current plan of care   Progression Toward Goals   Progression toward goals Progressing toward goals          SLP Education - 01/20/16 1351    Education provided Yes   Education Details Constant Therapy handout   Person(s) Educated Patient   Methods Explanation;Handout   Comprehension Verbalized understanding          SLP Short Term Goals - 01/19/16 1011    SLP SHORT TERM GOAL #3   Title Pt will utlize compensations to reduce coughing on his saliva while at rest, reporting coughing on saliva reduced by 25% subjectively.   Status Achieved          SLP Long Term Goals - 01/19/16 1011    SLP LONG TERM GOAL #1   Title pt to maintain average 70dB in 15 minutes mod complex conversation outside [place] room over two  sessions   Time 4   Period Weeks   Status On-going   SLP LONG TERM GOAL #2   Title Pt will maintain an average of 70dBfor modcomplex conversation over 12 minutes with rare minimal assistance over 2 sessions    Time 4   Period Weeks   Status On-going          Plan - 01/20/16 1353    Clinical Impression Statement Pt maintaining average above 70dB duirng semi-structured speech tasks and simple to mod complex conversation with rare min verbal cues to maintain adeqaute volume.. Continue skilled ST to maximize intellgibility.   Speech Therapy Frequency 2x / week   Duration 4 weeks   Treatment/Interventions SLP instruction and feedback;Internal/external aids;Patient/family education;Functional tasks;Compensatory strategies;Aspiration precaution training   Potential to Achieve  Goals Good   Potential Considerations Severity of impairments      Patient will benefit from skilled therapeutic intervention in order to improve the following deficits and impairments:   Dysarthria and anarthria  Dysphagia    Problem List Patient Active Problem List   Diagnosis Date Noted  . Parkinsonian features 09/15/2015  . ED (erectile dysfunction) 09/15/2015  . MDD (major depressive disorder), recurrent episode, severe ([place]) 02/10/2014  . Nonspecific abnormal electrocardiogram (ECG) (EKG) 02/07/2014  . Non-compliant behavior 02/03/2014  . Morton's neuroma of left foot 08/07/2013  . 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 Hyperglycemia 01/26/2012  . Hypogonadism male 01/24/2012  . Syncope and collapse 01/20/2012  .  OSA (obstructive sleep apnea) 01/17/2011  . CHEST TIGHTNESS 02/18/2008    [name] [name] ,MS, CCC-SLP  01/20/2016, 1:54 PM  [facility] [address] [place], [place], [address] Phone: [phone]   Fax:  [phone]   Name: [name] [name] MRN: [id number] Date of Birth: 1951/03/12

## 2016-01-20 NOTE — Therapy (Signed)
[facility] [address] [place], [place], [address] Phone: [phone]   Fax:  [phone]  Physical Therapy Treatment  Patient Details  Name: [name] [name] MRN: [id number] Date of Birth: 24-Dec-1950 Referring Provider: [name] Tat  Encounter Date: 01/20/2016      PT End of Session - 01/20/16 1239    Visit Number 8   Number of Visits 17   Date for PT Re-Evaluation 08/24/15   Authorization Type BCBS FED  75 visit limit combined   PT Start Time 0803   PT Stop Time 0845   PT Time Calculation (min) 42 min   Activity Tolerance Patient tolerated treatment well   Behavior During Therapy [facility] for tasks assessed/performed      Past Medical History  Diagnosis Date  . Headache(784.0)   . Cardiac murmur     as a child  . Streptococcal meningitis     as an infant  . Depression   .  OSA (obstructive sleep apnea) 01/17/2011    npsg 2012:  AHI 67/hr. Auto titration 2012:  Optimal pressure 12cm.   Marland Kitchen HEADACHES, HX OF 02/18/2008    Qualifier: Diagnosis of  By: Danny Lawless CMA, [place]    . APPENDECTOMY, HX OF 02/18/2008    Qualifier: Diagnosis of  By: Danny Lawless CMA, [place]    . Parkinson disease ([place]) 11/2014    Past Surgical History  Procedure Laterality Date  . Nasal sinus surgery      x 4 as a child  . Vasectomy    . Appendectomy  1967    There were no vitals filed for this visit.      Subjective Assessment - 01/20/16 0809    Subjective Denies changes since last visit.   Patient Stated Goals Pt's goal for therapy is to address the shuffling with walking.   Currently in Pain? No/denies                         [facility] Adult PT Treatment/Exercise - 01/20/16 0001    Knee/Hip Exercises: Aerobic   Recumbent Bike Scifit level 5.0 all 4 extremities x 8 minutes with rpm>65 for flexibility and endurance       PWR! Moves Basic 4 of Seated, Sitting and Supine x 20 with cues for intensity and  technique        PT Education - 01/20/16 1238    Education provided Yes   Education Details Reviewed PWR! Seated, standing and supine;  PWR! Moves community class   Person(s) Educated Patient   Methods Explanation;Demonstration;Handout   Comprehension Verbalized understanding;Returned demonstration          PT Short Term Goals - 01/12/16 1130    PT SHORT TERM GOAL #1   Title Pt will be independent with HEP for improved bed mobility, transfers, balance and gait.  TARGET 01/15/16   Baseline needs cues for intensity   Time 4   Period Weeks   Status Partially Met   PT SHORT TERM GOAL #2   Title Pt will improve 5x sit<>stand to less than or equal 14 seconds for improved transfer efficiency and safety.   Baseline 17.08 sec 01/11/16   Time 4   Period Weeks   Status Not Met   PT SHORT TERM GOAL #3   Title Pt will improve single limb stance to at least 4 seconds bilateral lower extremities for improved stair and obstacle negotiation.   Baseline 8 seconds RLE, 1 second LLE  Time 4   Period Weeks   Status Partially Met   PT SHORT TERM GOAL #4   Title 3 minute walk test to be completed, with pt able to ambulate at least 50 ft greater than baseline, for improved endurance for long distance gait for work.   Baseline 581 ft at best 01/11/16   Time 4   Period Weeks   Status Not Met   PT SHORT TERM GOAL #5   Title Pt will verbalize at least 25% improvement in bed mobility.   Baseline pt reports still having difficulty and thinks it is due to his mattress   Time 4   Period Weeks   Status Not Met           PT Long Term Goals - 12/17/15 1705    PT LONG TERM GOAL #1   Title Pt will verbalize understanding of fall prevention/tips to reduce freezing with gait.  TARGET 02/15/16   Time 8   Period Weeks   Status New   PT LONG TERM GOAL #2   Title Pt will perform at least 8 of 10 reps of sit<>stand from 18 inch surfaces or lower, with minimal to no UE support for improved transfer  efficiency and safety.   Time 8   Period Weeks   Status New   PT LONG TERM GOAL #3   Title Pt will improve Functional Gait Assessment to at least 19/30 for decreased fall risk.   Time 8   Period Weeks   PT LONG TERM GOAL #4   Title Pt will negotiate at least 4 steps with one handrail, step through pattern, modified independently without loss of balance.   Time 8   Period Weeks   Status New   PT LONG TERM GOAL #5   Title Pt will verbalize understanding of plans for community fitness upon D/C from PT.   Time 8   Period Weeks   Status New               Plan - 01/20/16 8315    Clinical Impression Statement Pt continues to need cues for intensity with exercises and cues on proper technique.  Continue PT per POC.   Rehab Potential Good   PT Frequency 2x / week   PT Duration 8 weeks  plus eval   PT Treatment/Interventions ADLs/Self Care Home Management;Therapeutic exercise;Therapeutic activities;Functional mobility training;Gait training;Balance training;Neuromuscular re-education   PT Next Visit Plan Pt to bring in booklet and review remaining exercises from previous session and revise as needed   PT Home Exercise Plan Walking program, speed and amplitude with mobility.   Consulted and Agree with Plan of Care Patient      Patient will benefit from skilled therapeutic intervention in order to improve the following deficits and impairments:  Abnormal gait, Decreased balance, Decreased mobility, Decreased strength, Difficulty walking, Impaired flexibility, Postural dysfunction  Visit Diagnosis: Other symptoms and signs involving the musculoskeletal system     Problem List Patient Active Problem List   Diagnosis Date Noted  . Parkinsonian features 09/15/2015  . ED (erectile dysfunction) 09/15/2015  . MDD (major depressive disorder), recurrent episode, severe ([place]) 02/10/2014  . Nonspecific abnormal electrocardiogram (ECG) (EKG) 02/07/2014  . Non-compliant behavior  02/03/2014  . Morton's neuroma of left foot 08/07/2013  . 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 Hyperglycemia 01/26/2012  . Hypogonadism male 01/24/2012  . Syncope and collapse 01/20/2012  .  OSA (obstructive sleep apnea) 01/17/2011  . CHEST  TIGHTNESS 02/18/2008    Narda Bonds 01/20/2016, 12:42 PM  [facility] [address] [place], [place], [address] Phone: [phone]   Fax:  [phone]  Name: [name] [name] MRN: [id number] Date of Birth: [birth year]    [place], [place] 01/20/2016 12:43 PM Phone: [phone] Fax: [phone]

## 2016-01-21 ENCOUNTER — Encounter: Payer: Self-pay | Admitting: Occupational Therapy

## 2016-01-21 ENCOUNTER — Ambulatory Visit: Payer: Self-pay | Admitting: Physical Therapy

## 2016-01-26 ENCOUNTER — Ambulatory Visit: Payer: Federal, State, Local not specified - PPO | Admitting: Occupational Therapy

## 2016-01-26 ENCOUNTER — Ambulatory Visit: Payer: Federal, State, Local not specified - PPO | Admitting: Speech Pathology

## 2016-01-26 ENCOUNTER — Ambulatory Visit: Payer: Federal, State, Local not specified - PPO | Admitting: Physical Therapy

## 2016-01-26 DIAGNOSIS — R293 Abnormal posture: Secondary | ICD-10-CM

## 2016-01-26 DIAGNOSIS — R2689 Other abnormalities of gait and mobility: Secondary | ICD-10-CM

## 2016-01-26 DIAGNOSIS — R29898 Other symptoms and signs involving the musculoskeletal system: Secondary | ICD-10-CM

## 2016-01-26 DIAGNOSIS — R278 Other lack of coordination: Secondary | ICD-10-CM

## 2016-01-26 DIAGNOSIS — R29818 Other symptoms and signs involving the nervous system: Secondary | ICD-10-CM

## 2016-01-26 DIAGNOSIS — R269 Unspecified abnormalities of gait and mobility: Secondary | ICD-10-CM | POA: Diagnosis not present

## 2016-01-26 DIAGNOSIS — R471 Dysarthria and anarthria: Secondary | ICD-10-CM

## 2016-01-26 NOTE — Therapy (Signed)
[facility] [address] [place], [place], [address] Phone: [phone]   Fax:  [phone]  Speech Language Pathology Treatment  Patient Details  Name: [name] [name] MRN: [id number] Date of Birth: 1951/07/26 Referring Provider: Dr. [name] Tat  Encounter Date: 01/26/2016      End of Session - 01/26/16 1203    Visit Number 11   Number of Visits 17   Date for SLP Re-Evaluation 02/10/16   Authorization Type 25   Authorization - Visit Number 11   SLP Start Time 0933   SLP Stop Time  [id number]   SLP Time Calculation (min) 41 min      Past Medical History  Diagnosis Date  . Headache(784.0)   . Cardiac murmur     as a child  . Streptococcal meningitis     as an infant  . Depression   .  OSA (obstructive sleep apnea) 01/17/2011    npsg 2012:  AHI 67/hr. Auto titration 2012:  Optimal pressure 12cm.   Marland Kitchen HEADACHES, HX OF 02/18/2008    Qualifier: Diagnosis of  By: Danny Lawless CMA, [place]    . APPENDECTOMY, HX OF 02/18/2008    Qualifier: Diagnosis of  By: Danny Lawless CMA, [place]    . Parkinson disease ([place]) 11/2014    Past Surgical History  Procedure Laterality Date  . Nasal sinus surgery      x 4 as a child  . Vasectomy    . Appendectomy  1967    There were no vitals filed for this visit.      Subjective Assessment - 01/26/16 0941    Subjective "I didn't bring in my organizer today"   Currently in Pain? No/denies               ADULT SLP TREATMENT - 01/26/16 0942    General Information   Behavior/Cognition Alert;Cooperative;Pleasant mood   Treatment Provided   Treatment provided Cognitive-Linquistic   Cognitive-Linquistic Treatment   Treatment focused on Dysarthria;Voice   Skilled Treatment Loud /a/ to recalibrate loudness with hoarse voice consistently, attmpted to use precededing "Hey" and "Hello"  without success in reducing hoarseness. Water provided to pt.  Structured speech tasks average of  69dB  wtih verbal sequencing. Simple conversation average 69dB with verbal and visual cues for breath support and loud volume.    Assessment / Recommendations / Plan   Plan Continue with current plan of care   Progression Toward Goals   Progression toward goals Progressing toward goals            SLP Short Term Goals - 01/26/16 1202    SLP SHORT TERM GOAL #1   Time 3   Period Weeks   Status Achieved   SLP SHORT TERM GOAL #2   Title Pt will average 70dB during structured speech tasks and simple conversation with occasional min A   Period Weeks   Status Achieved   SLP SHORT TERM GOAL #3   Title Pt will utlize compensations to reduce coughing on his saliva while at rest, reporting coughing on saliva reduced by 25% subjectively.   Status Achieved          SLP Long Term Goals - 01/26/16 1203    SLP LONG TERM GOAL #1   Title pt to maintain average 70dB in 15 minutes mod complex conversation outside [place] room over two sessions   Time 3   Period Weeks   Status On-going   SLP LONG TERM GOAL #2  Title Pt will maintain an average of 70dBfor modcomplex conversation over 12 minutes with rare minimal assistance over 2 sessions    Time 3   Period Weeks   Status On-going          Plan - 01/26/16 1200    Clinical Impression Statement Pt required min A to maintain loudness during simple conversation and structured tasks. Continue skilled ST to maximize intelligiblity and carryover of compensations for dysarthria   Speech Therapy Frequency 2x / week   Treatment/Interventions SLP instruction and feedback;Internal/external aids;Patient/family education;Functional tasks;Compensatory strategies;Aspiration precaution training   Potential to Achieve Goals Good   Potential Considerations Severity of impairments   Consulted and Agree with Plan of Care Patient      Patient will benefit from skilled therapeutic intervention in order to improve the following deficits and impairments:   Dysarthria  and anarthria    Problem List Patient Active Problem List   Diagnosis Date Noted  . Parkinsonian features 09/15/2015  . ED (erectile dysfunction) 09/15/2015  . MDD (major depressive disorder), recurrent episode, severe ([place]) 02/10/2014  . Nonspecific abnormal electrocardiogram (ECG) (EKG) 02/07/2014  . Non-compliant behavior 02/03/2014  . Morton's neuroma of left foot 08/07/2013  . 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 Hyperglycemia 01/26/2012  . Hypogonadism male 01/24/2012  . Syncope and collapse 01/20/2012  .  OSA (obstructive sleep apnea) 01/17/2011  . CHEST TIGHTNESS 02/18/2008    [place], Annye Rusk MS, CCC-SLP 01/26/2016, 12:04 PM  [facility] [address] [place], [place], [address] Phone: [phone]   Fax:  [phone]   Name: [name] [name] MRN: [id number] Date of Birth: 07-17-1951

## 2016-01-26 NOTE — Therapy (Signed)
[facility] [address] [place], [place], [address] Phone: [phone]   Fax:  [phone]  Physical Therapy Treatment  Patient Details  Name: [name] [name] MRN: [id number] Date of Birth: 05-15-1951 Referring Provider: [name] Tat  Encounter Date: 01/26/2016      PT End of Session - 01/26/16 1308    Visit Number 9   Number of Visits 17   Date for PT Re-Evaluation 08/24/15   Authorization Type BCBS FED  75 visit limit combined   PT Start Time 0804   PT Stop Time 0843   PT Time Calculation (min) 39 min   Activity Tolerance Patient tolerated treatment well   Behavior During Therapy [facility] for tasks assessed/performed      Past Medical History  Diagnosis Date  . Headache(784.0)   . Cardiac murmur     as a child  . Streptococcal meningitis     as an infant  . Depression   .  OSA (obstructive sleep apnea) 01/17/2011    npsg 2012:  AHI 67/hr. Auto titration 2012:  Optimal pressure 12cm.   Marland Kitchen HEADACHES, HX OF 02/18/2008    Qualifier: Diagnosis of  By: Danny Lawless CMA, [place]    . APPENDECTOMY, HX OF 02/18/2008    Qualifier: Diagnosis of  By: Danny Lawless CMA, [place]    . Parkinson disease ([place]) 11/2014    Past Surgical History  Procedure Laterality Date  . Nasal sinus surgery      x 4 as a child  . Vasectomy    . Appendectomy  1967    There were no vitals filed for this visit.      Subjective Assessment - 01/26/16 0805    Subjective No changes reported since last visit.   Patient Stated Goals Pt's goal for therapy is to address the shuffling with walking.   Currently in Pain? No/denies                         [facility] Adult PT Treatment/Exercise - 01/26/16 0808    Transfers   Transfers Sit to Stand;Stand to Sit   Sit to Stand 6: Modified independent (Device/Increase time);Without upper extremity assist;From chair/3-in-1   Stand to Sit 6: Modified independent (Device/Increase time);Without upper  extremity assist;To chair/3-in-1   Number of Reps 10 reps;Other sets (comment)  from 20" then 18" surfaces   Comments Sit<>stand repetitions for improved functional strengthening   Ambulation/Gait   Ambulation/Gait Yes   Ambulation/Gait Assistance 5: Supervision   Ambulation/Gait Assistance Details Cues provided for relaxed, large amplitude arm swing, increased step length and best posture.   Ambulation Distance (Feet) 600 Feet   Assistive device None   Gait Pattern Decreased arm swing - right;Decreased arm swing - left;Step-through pattern;Decreased step length - right;Decreased step length - left;Narrow base of support;Trunk flexed;Decreased trunk rotation   Ambulation Surface Level;Indoor   Pre-Gait Activities Heel/toe raises 2 sets x 10 reps, then stagger stance forward/back weigthshifting x 10 reps prior to gait for improved heelstrike.   Gait Comments --           [facility]) - 01/26/16 5397    PWR! exercises Moves in standing   PWR! Step 8  per pt request, as he has difficulty with this   Comments Cues for rocking back then step the foot diagonal/forward out to side with large amplitude effort.   PWR! Up 20   PWR! Rock 20   PWR! Twist 20  PWR Step 20    Comments Cues for intensity and large amplitude of movement   PWR! Sit to Stand followed by PWR! Up, PWR! [place], [place]! Twist, PWR! Step x 5 reps each as progression of exercises               PT Short Term Goals - 01/12/16 1130    PT SHORT TERM GOAL #1   Title Pt will be independent with HEP for improved bed mobility, transfers, balance and gait.  TARGET 01/15/16   Baseline needs cues for intensity   Time 4   Period Weeks   Status Partially Met   PT SHORT TERM GOAL #2   Title Pt will improve 5x sit<>stand to less than or equal 14 seconds for improved transfer efficiency and safety.   Baseline 17.08 sec 01/11/16   Time 4   Period Weeks   Status Not Met   PT SHORT TERM GOAL #3   Title Pt will improve single limb  stance to at least 4 seconds bilateral lower extremities for improved stair and obstacle negotiation.   Baseline 8 seconds RLE, 1 second LLE   Time 4   Period Weeks   Status Partially Met   PT SHORT TERM GOAL #4   Title 3 minute walk test to be completed, with pt able to ambulate at least 50 ft greater than baseline, for improved endurance for long distance gait for work.   Baseline 581 ft at best 01/11/16   Time 4   Period Weeks   Status Not Met   PT SHORT TERM GOAL #5   Title Pt will verbalize at least 25% improvement in bed mobility.   Baseline pt reports still having difficulty and thinks it is due to his mattress   Time 4   Period Weeks   Status Not Met           PT Long Term Goals - 12/17/15 1705    PT LONG TERM GOAL #1   Title Pt will verbalize understanding of fall prevention/tips to reduce freezing with gait.  TARGET 02/15/16   Time 8   Period Weeks   Status New   PT LONG TERM GOAL #2   Title Pt will perform at least 8 of 10 reps of sit<>stand from 18 inch surfaces or lower, with minimal to no UE support for improved transfer efficiency and safety.   Time 8   Period Weeks   Status New   PT LONG TERM GOAL #3   Title Pt will improve Functional Gait Assessment to at least 19/30 for decreased fall risk.   Time 8   Period Weeks   PT LONG TERM GOAL #4   Title Pt will negotiate at least 4 steps with one handrail, step through pattern, modified independently without loss of balance.   Time 8   Period Weeks   Status New   PT LONG TERM GOAL #5   Title Pt will verbalize understanding of plans for community fitness upon D/C from PT.   Time 8   Period Weeks   Status New               Plan - 01/26/16 1309    Clinical Impression Statement Pt continues to need cues for intensity with exercises and with gait for improved coordination of arm swing and foot clearance with gait.  Pt will continue to benefit from further skilled PT to address posture, balance, gait and  transfers.   Rehab Potential Good  PT Frequency 2x / week   PT Duration 8 weeks  plus eval   PT Treatment/Interventions ADLs/Self Care Home Management;Therapeutic exercise;Therapeutic activities;Functional mobility training;Gait training;Balance training;Neuromuscular re-education   PT Next Visit Plan Consider adding Sit<>stand with PWR! Moves as part of HEP.   PT Home Exercise Plan Walking program, speed and amplitude with mobility.  GCODE next visit   Consulted and Agree with Plan of Care Patient      Patient will benefit from skilled therapeutic intervention in order to improve the following deficits and impairments:  Abnormal gait, Decreased balance, Decreased mobility, Decreased strength, Difficulty walking, Impaired flexibility, Postural dysfunction  Visit Diagnosis: Abnormal posture  Other abnormalities of gait and mobility     Problem List Patient Active Problem List   Diagnosis Date Noted  . Parkinsonian features 09/15/2015  . ED (erectile dysfunction) 09/15/2015  . MDD (major depressive disorder), recurrent episode, severe ([place]) 02/10/2014  . Nonspecific abnormal electrocardiogram (ECG) (EKG) 02/07/2014  . Non-compliant behavior 02/03/2014  . Morton's neuroma of left foot 08/07/2013  . 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 Hyperglycemia 01/26/2012  . Hypogonadism male 01/24/2012  . Syncope and collapse 01/20/2012  .  OSA (obstructive sleep apnea) 01/17/2011  . CHEST TIGHTNESS 02/18/2008    Orlanda Lemmerman W. 01/26/2016, 1:13 PM Frazier Butt., PT [facility] [address] [place], [place], [address] Phone: [phone]   Fax:  [phone]  Name: [name] [name] MRN: [id number] Date of Birth: [birth year]

## 2016-01-26 NOTE — Therapy (Signed)
[facility] [address] [place], [place], [address] Phone: [phone]   Fax:  [phone]  Occupational Therapy Treatment  Patient Details  Name: [name] [name] MRN: [id number] Date of Birth: 1951-06-11 Referring Provider: Dr. Carles Collet  Encounter Date: 01/26/2016      OT End of Session - 01/26/16 1005    Visit Number 11   Number of Visits 17   Date for OT Re-Evaluation 02/13/16   Authorization Type BCBS-75 visit limit   OT Start Time 0851   OT Stop Time 0930   OT Time Calculation (min) 39 min   Activity Tolerance Patient tolerated treatment well   Behavior During Therapy [facility] for tasks assessed/performed      Past Medical History  Diagnosis Date  . Headache(784.0)   . Cardiac murmur     as a child  . Streptococcal meningitis     as an infant  . Depression   .  OSA (obstructive sleep apnea) 01/17/2011    npsg 2012:  AHI 67/hr. Auto titration 2012:  Optimal pressure 12cm.   Marland Kitchen HEADACHES, HX OF 02/18/2008    Qualifier: Diagnosis of  By: Danny Lawless CMA, [place]    . APPENDECTOMY, HX OF 02/18/2008    Qualifier: Diagnosis of  By: Danny Lawless CMA, [place]    . Parkinson disease ([place]) 11/2014    Past Surgical History  Procedure Laterality Date  . Nasal sinus surgery      x 4 as a child  . Vasectomy    . Appendectomy  1967    There were no vitals filed for this visit.      Subjective Assessment - 01/26/16 1002    Subjective  Deneis pain   Pertinent History dx PD 11/2014, hx of depression, mild cognitive impairment per recent neuropsych cognitive testing   Patient Stated Goals Pt reports increased weakness   Currently in Pain? No/denies        Treatment: arm bike x 6 mins level 1 for conditioning, pt maintained >45 RPM Reviewed adapted strategy for donning jacket, pt practiced multiple times with good carryover as evidenced by pt meeting STG 5. Therapist checked progress towards short term goals and reviewed  with pt. Flipping and dealing playing cards with larger amplitude movements, min -mod v.c.                        OT Short Term Goals - 01/26/16 0908    OT SHORT TERM GOAL #1   Title Pt will be independent with updated HEP    Period Weeks   Status (p) On-going   OT SHORT TERM GOAL #2   Title Pt will demonstrate improved ease with feeding as evidenced by performing PPT#2  in 11 secs or less.   Baseline 14.22   Time 4   Period Weeks   Status (p) On-going  12.75, 12.38   OT SHORT TERM GOAL #3   Title Pt will verbalize understanding of cognitive compensation strategies/ways to promote cognitive skills prn.   Time 4   Period Weeks   Status (p) Achieved   OT SHORT TERM GOAL #4   Title Pt will demo incr ease with dressing as shown by ability to button/unbutton 3 buttons on table in less than 38sec.   Baseline 43.22sec   Time 4   Period Weeks   Status (p) Achieved  25.78 secs   OT SHORT TERM GOAL #5   Title Pt will demonstrate increased ease  with dressing as evidenced by decreasing PPT#4  by 5 secs.   Time 4   Period Weeks   Status Achieved  11.59 secs           OT Long Term Goals - 12/16/15 1349    OT LONG TERM GOAL #1   Title Pt will verbalize understanding of updated AE/strategies for ADLs/IADLs prn    Time 8   Status New   OT LONG TERM GOAL #2   Title Pt will improve dressing ability as shown by improving time on PPT#4 by at least 10 sec.   Baseline 40.72 secs   Time 8   Period Weeks   Status New   OT LONG TERM GOAL #3   Title Pt will demonstrate ability to write a paragraph with 100% legibility and no significant decrease in letter size.   Time 8   Period Weeks   Status New   OT LONG TERM GOAL #4   Title Pt will verbalize understanding of ways to prevent future PD-related complications and verbalize understanding of community resources.   Time 8   Period Weeks   Status New   OT LONG TERM GOAL #5   Title  -----------------------------------------------------------------------------------------------------------   OT LONG TERM GOAL #6   Title --------------------------------------------------------               Plan - 01/26/16 1004    Clinical Impression Statement Pt demonstrates good progress towards short term goals . He demonstrates carryover of adapted techniques for donning jacket   Rehab Potential Good   OT Frequency 2x / week   OT Duration 8 weeks   OT Treatment/Interventions Self-care/ADL training;Moist Heat;DME and/or AE instruction;Splinting;Patient/family education;Balance training;Therapeutic exercises;Therapeutic exercise;Therapeutic activities;Cognitive remediation/compensation;Passive range of motion;Functional Mobility Training;Neuromuscular education;Cryotherapy;Electrical Stimulation;Energy conservation;Manual Therapy   Plan continue to review coordination activities   OT Home Exercise Plan Education provided:  PWR! moves in supine (basic 4) 12/21/15, PWR! moves quadraped 12/25/15; 01/05/16 PWR! hands (basic 4), coordination HEP, keeping thinking skills sharp/ memory compensations   Consulted and Agree with Plan of Care Patient      Patient will benefit from skilled therapeutic intervention in order to improve the following deficits and impairments:  Decreased coordination, Decreased endurance, Decreased activity tolerance, Impaired tone, Impaired UE functional use, Decreased knowledge of use of DME, Decreased balance, Decreased cognition, Decreased mobility  Visit Diagnosis: Other lack of coordination  Other symptoms and signs involving the nervous system  Other symptoms and signs involving the musculoskeletal system  Abnormal posture    Problem List Patient Active Problem List   Diagnosis Date Noted  . Parkinsonian features 09/15/2015  . ED (erectile dysfunction) 09/15/2015  . MDD (major depressive disorder), recurrent episode, severe ([place]) 02/10/2014  .  Nonspecific abnormal electrocardiogram (ECG) (EKG) 02/07/2014  . Non-compliant behavior 02/03/2014  . Morton's neuroma of left foot 08/07/2013  . 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 Hyperglycemia 01/26/2012  . Hypogonadism male 01/24/2012  . Syncope and collapse 01/20/2012  .  OSA (obstructive sleep apnea) 01/17/2011  . CHEST TIGHTNESS 02/18/2008    RINE,KATHRYN 01/26/2016, 10:06 AM Theone Murdoch, OTR/L [phone] Phone: [phone] 10:06 AM 01/26/2016 [facility] [address] [place], [place], [address] Phone: [phone]   Fax:  [phone]  Name: [name] [name] MRN: [id number] Date of Birth: 09-09-1951

## 2016-01-28 ENCOUNTER — Ambulatory Visit: Payer: Federal, State, Local not specified - PPO | Admitting: Physical Therapy

## 2016-01-28 ENCOUNTER — Ambulatory Visit: Payer: Federal, State, Local not specified - PPO

## 2016-01-28 ENCOUNTER — Ambulatory Visit: Payer: Federal, State, Local not specified - PPO | Admitting: Occupational Therapy

## 2016-01-28 DIAGNOSIS — R471 Dysarthria and anarthria: Secondary | ICD-10-CM

## 2016-01-28 DIAGNOSIS — R29818 Other symptoms and signs involving the nervous system: Secondary | ICD-10-CM

## 2016-01-28 DIAGNOSIS — R29898 Other symptoms and signs involving the musculoskeletal system: Secondary | ICD-10-CM

## 2016-01-28 DIAGNOSIS — R269 Unspecified abnormalities of gait and mobility: Secondary | ICD-10-CM | POA: Diagnosis not present

## 2016-01-28 DIAGNOSIS — R2689 Other abnormalities of gait and mobility: Secondary | ICD-10-CM

## 2016-01-28 DIAGNOSIS — R278 Other lack of coordination: Secondary | ICD-10-CM

## 2016-01-28 NOTE — Therapy (Signed)
[facility] [address] [place], [place], [address] Phone: [phone]   Fax:  [phone]  Physical Therapy Treatment  Patient Details  Name: [name] [name] MRN: [id number] Date of Birth: Sep 10, 1951 Referring Provider: [name] Tat  Encounter Date: 01/28/2016      PT End of Session - 01/28/16 1308    Visit Number 10   Number of Visits 17   Date for PT Re-Evaluation 02/16/16   Authorization Type BCBS FED  75 visit limit combined   PT Start Time 0804   PT Stop Time 0844   PT Time Calculation (min) 40 min   Activity Tolerance Patient tolerated treatment well   Behavior During Therapy [facility] for tasks assessed/performed      Past Medical History  Diagnosis Date  . Headache(784.0)   . Cardiac murmur     as a child  . Streptococcal meningitis     as an infant  . Depression   .  OSA (obstructive sleep apnea) 01/17/2011    npsg 2012:  AHI 67/hr. Auto titration 2012:  Optimal pressure 12cm.   Marland Kitchen HEADACHES, HX OF 02/18/2008    Qualifier: Diagnosis of  By: Danny Lawless CMA, [place]    . APPENDECTOMY, HX OF 02/18/2008    Qualifier: Diagnosis of  By: Danny Lawless CMA, [place]    . Parkinson disease ([place]) 11/2014    Past Surgical History  Procedure Laterality Date  . Nasal sinus surgery      x 4 as a child  . Vasectomy    . Appendectomy  1967    There were no vitals filed for this visit.      Subjective Assessment - 01/28/16 0807    Subjective Little stiff and tired this morning.   Patient Stated Goals Pt's goal for therapy is to address the shuffling with walking.   Currently in Pain? No/denies                         [facility] Adult PT Treatment/Exercise - 01/28/16 0833    Transfers   Transfers Sit to Stand;Stand to Sit   Sit to Stand 6: Modified independent (Device/Increase time);Without upper extremity assist;From chair/3-in-1   Stand to Sit 6: Modified independent (Device/Increase time);Without upper  extremity assist;To chair/3-in-1   Number of Reps 10 reps  from mat surface (24")   Ambulation/Gait   Ambulation/Gait Yes   Ambulation/Gait Assistance 5: Supervision   Ambulation/Gait Assistance Details Initial cues for use of walking poles, reciprocal pattern   Ambulation Distance (Feet) 200 Feet  ft x 2 indoors, then 800 ft indoors   Assistive device --  bilateral walking poles   Gait Pattern Step-through pattern  Good consistency with reciporcal pattern with walking poles   Ambulation Surface Level;Indoor;Outdoor;Paved   Pre-Gait Activities Heel/toe raises x 20 reps, then stagger stance forward/back rocking x 15 reps with added arm swing.   High Level Balance   High Level Balance Activities Other (comment)   High Level Balance Comments Step and stop gait activity with coordinated arms x 25 ft, then step and pause activity with reciprocal arm swing x 25 ft (2 reps) with cues for coordinated activity           P[facility]) - 01/28/16 0809    PWR! exercises Moves in standing   PWR! Up 10   PWR! Rock 10   PWR! Twist 10   PWR Step 10   Comments PWR! Sti<>stand each time followed  by each PWR! Move             PT Education - 01/28/16 1307    Education provided Yes   Education Details Addition of sit<>stand with each standing PWR! Move, to be performed 1-2x/wk (verbally added, as pt does not have HEP handouts)   Person(s) Educated Patient   Methods Explanation   Comprehension Verbalized understanding;Returned demonstration          PT Short Term Goals - 01/12/16 1130    PT SHORT TERM GOAL #1   Title Pt will be independent with HEP for improved bed mobility, transfers, balance and gait.  TARGET 01/15/16   Baseline needs cues for intensity   Time 4   Period Weeks   Status Partially Met   PT SHORT TERM GOAL #2   Title Pt will improve 5x sit<>stand to less than or equal 14 seconds for improved transfer efficiency and safety.   Baseline 17.08 sec 01/11/16   Time 4    Period Weeks   Status Not Met   PT SHORT TERM GOAL #3   Title Pt will improve single limb stance to at least 4 seconds bilateral lower extremities for improved stair and obstacle negotiation.   Baseline 8 seconds RLE, 1 second LLE   Time 4   Period Weeks   Status Partially Met   PT SHORT TERM GOAL #4   Title 3 minute walk test to be completed, with pt able to ambulate at least 50 ft greater than baseline, for improved endurance for long distance gait for work.   Baseline 581 ft at best 01/11/16   Time 4   Period Weeks   Status Not Met   PT SHORT TERM GOAL #5   Title Pt will verbalize at least 25% improvement in bed mobility.   Baseline pt reports still having difficulty and thinks it is due to his mattress   Time 4   Period Weeks   Status Not Met           PT Long Term Goals - 12/17/15 1705    PT LONG TERM GOAL #1   Title Pt will verbalize understanding of fall prevention/tips to reduce freezing with gait.  TARGET 02/15/16   Time 8   Period Weeks   Status New   PT LONG TERM GOAL #2   Title Pt will perform at least 8 of 10 reps of sit<>stand from 18 inch surfaces or lower, with minimal to no UE support for improved transfer efficiency and safety.   Time 8   Period Weeks   Status New   PT LONG TERM GOAL #3   Title Pt will improve Functional Gait Assessment to at least 19/30 for decreased fall risk.   Time 8   Period Weeks   PT LONG TERM GOAL #4   Title Pt will negotiate at least 4 steps with one handrail, step through pattern, modified independently without loss of balance.   Time 8   Period Weeks   Status New   PT LONG TERM GOAL #5   Title Pt will verbalize understanding of plans for community fitness upon D/C from PT.   Time 8   Period Weeks   Status New               Plan - 01/28/16 1309    Clinical Impression Statement Pt sequences walking poles well after initial instruction.  Pt does have slight difficulty maintaining sequence with added conversation  tasks.  Pt  will continue to benefit from further skilled PT to address posture, balance, gait and transfers.   Rehab Potential Good   PT Frequency 2x / week   PT Duration 8 weeks  plus eval   PT Treatment/Interventions ADLs/Self Care Home Management;Therapeutic exercise;Therapeutic activities;Functional mobility training;Gait training;Balance training;Neuromuscular re-education   PT Next Visit Plan Continue to work on intensity of movement, coordinated UE/lower extremity activities for improved dynamic gait and balance.   PT Home Exercise Plan Walking program, speed and amplitude with mobility.     Consulted and Agree with Plan of Care Patient      Patient will benefit from skilled therapeutic intervention in order to improve the following deficits and impairments:  Abnormal gait, Decreased balance, Decreased mobility, Decreased strength, Difficulty walking, Impaired flexibility, Postural dysfunction  Visit Diagnosis: Other abnormalities of gait and mobility       G-Codes - 02/03/2016 1311    Functional Assessment Tool Used 5x sit<>stnad 17.08 sec, 3 minute walk:  581 ft   Functional Limitation Mobility: Walking and moving around   Mobility: Walking and Moving Around Current Status [phone]) At least 20 percent but less than 40 percent impaired, limited or restricted   Mobility: Walking and Moving Around Goal Status [phone]) At least 1 percent but less than 20 percent impaired, limited or restricted      Problem List Patient Active Problem List   Diagnosis Date Noted  . Parkinsonian features 09/15/2015  . ED (erectile dysfunction) 09/15/2015  . MDD (major depressive disorder), recurrent episode, severe ([place]) 02/10/2014  . Nonspecific abnormal electrocardiogram (ECG) (EKG) 02/07/2014  . Non-compliant behavior 02/03/2014  . Morton's neuroma of left foot 08/07/2013  . 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 Hyperglycemia 01/26/2012  . Hypogonadism male  01/24/2012  . Syncope and collapse 01/20/2012  .  OSA (obstructive sleep apnea) 01/17/2011  . CHEST TIGHTNESS 02/18/2008    Mushka Laconte W. 02/03/16, 1:12 PM Frazier Butt., PT  [facility] [address] [place], [place], [address] Phone: [phone]   Fax:  [phone]  Name: [name] [name] MRN: [id number] Date of Birth: [birth year]

## 2016-01-28 NOTE — Therapy (Signed)
[facility] [address] [place], [place], [address] Phone: [phone]   Fax:  [phone]  Occupational Therapy Treatment  Patient Details  Name: [name] [name] MRN: [id number] Date of Birth: [birth year] Referring Provider: Dr. Carles Collet  Encounter Date: 01/28/2016      OT End of Session - 01/28/16 0928    Visit Number 12   Number of Visits 17   Date for OT Re-Evaluation 02/13/16   Authorization Type BCBS-75 visit limit   OT Start Time 0848   OT Stop Time 0930   OT Time Calculation (min) 42 min   Activity Tolerance Patient tolerated treatment well   Behavior During Therapy [facility] for tasks assessed/performed      Past Medical History  Diagnosis Date  . Headache(784.0)   . Cardiac murmur     as a child  . Streptococcal meningitis     as an infant  . Depression   .  OSA (obstructive sleep apnea) 01/17/2011    npsg 2012:  AHI 67/hr. Auto titration 2012:  Optimal pressure 12cm.   Marland Kitchen HEADACHES, HX OF 02/18/2008    Qualifier: Diagnosis of  By: Danny Lawless CMA, [place]    . APPENDECTOMY, HX OF 02/18/2008    Qualifier: Diagnosis of  By: Danny Lawless CMA, [place]    . Parkinson disease ([place]) 11/2014    Past Surgical History  Procedure Laterality Date  . Nasal sinus surgery      x 4 as a child  . Vasectomy    . Appendectomy  1967    There were no vitals filed for this visit.      Subjective Assessment - 01/28/16 0916    Subjective  No reports of pain   Pertinent History dx PD 11/2014, hx of depression, mild cognitive impairment per recent neuropsych cognitive testing   Patient Stated Goals Pt reports increased weakness       Arm bike x 6 mins level 1 for conditioning, pt maintained 40 RPM. Fine motor coordination activities : flipping and dealing cards, then extending fingers to slide large cards off table top 5 in a row for emphasis on large amplitude movements with bilateral UE's Picking up and stacking coins then  counting out of hand with min v.c for larger amplitude movements. Ambulating while tossing a ball and performing category generation for cognitive component/ dual tasking, min v.c./ difficulty                        OT Short Term Goals - 01/26/16 0908    OT SHORT TERM GOAL #1   Title Pt will be independent with updated HEP    Period Weeks   Status On-going   OT SHORT TERM GOAL #2   Title Pt will demonstrate improved ease with feeding as evidenced by performing PPT#2  in 11 secs or less.   Baseline 14.22   Time 4   Period Weeks   Status On-going  12.75, 12.38   OT SHORT TERM GOAL #3   Title Pt will verbalize understanding of cognitive compensation strategies/ways to promote cognitive skills prn.   Time 4   Period Weeks   Status Achieved   OT SHORT TERM GOAL #4   Title Pt will demo incr ease with dressing as shown by ability to button/unbutton 3 buttons on table in less than 38sec.   Baseline 43.22sec   Time 4   Period Weeks   Status Achieved  25.78 secs  OT SHORT TERM GOAL #5   Title Pt will demonstrate increased ease with dressing as evidenced by decreasing PPT#4  by 5 secs.   Time 4   Period Weeks   Status Achieved  11.59 secs           OT Long Term Goals - 12/16/15 1349    OT LONG TERM GOAL #1   Title Pt will verbalize understanding of updated AE/strategies for ADLs/IADLs prn    Time 8   Status New   OT LONG TERM GOAL #2   Title Pt will improve dressing ability as shown by improving time on PPT#4 by at least 10 sec.   Baseline 40.72 secs   Time 8   Period Weeks   Status New   OT LONG TERM GOAL #3   Title Pt will demonstrate ability to write a paragraph with 100% legibility and no significant decrease in letter size.   Time 8   Period Weeks   Status New   OT LONG TERM GOAL #4   Title Pt will verbalize understanding of ways to prevent future PD-related complications and verbalize understanding of community resources.   Time 8   Period  Weeks   Status New   OT LONG TERM GOAL #5   Title -----------------------------------------------------------------------------------------------------------   OT LONG TERM GOAL #6   Title --------------------------------------------------------               Plan - 01/28/16 0919    Clinical Impression Statement Pt is progressing towards goals for fine motor coordination activities with larger amplitude movements   Rehab Potential Good   OT Frequency 2x / week   OT Duration 8 weeks   OT Treatment/Interventions Self-care/ADL training;Moist Heat;DME and/or AE instruction;Splinting;Patient/family education;Balance training;Therapeutic exercises;Therapeutic exercise;Therapeutic activities;Cognitive remediation/compensation;Passive range of motion;Functional Mobility Training;Neuromuscular education;Cryotherapy;Electrical Stimulation;Energy conservation;Manual Therapy   Plan large amplitude movments with functional activity, dual tasking,    Consulted and Agree with Plan of Care Patient      Patient will benefit from skilled therapeutic intervention in order to improve the following deficits and impairments:  Decreased coordination, Decreased endurance, Decreased activity tolerance, Impaired tone, Impaired UE functional use, Decreased knowledge of use of DME, Decreased balance, Decreased cognition, Decreased mobility  Visit Diagnosis: Other lack of coordination  Other symptoms and signs involving the nervous system  Other symptoms and signs involving the musculoskeletal system    Problem List Patient Active Problem List   Diagnosis Date Noted  . Parkinsonian features 09/15/2015  . ED (erectile dysfunction) 09/15/2015  . MDD (major depressive disorder), recurrent episode, severe ([place]) 02/10/2014  . Nonspecific abnormal electrocardiogram (ECG) (EKG) 02/07/2014  . Non-compliant behavior 02/03/2014  . Morton's neuroma of left foot 08/07/2013  . Attention deficit disorder without  mention of hyperactivity 03/13/2012  . Hyperglycemia 01/26/2012  . Hypogonadism male 01/24/2012  . Syncope and collapse 01/20/2012  .  OSA (obstructive sleep apnea) 01/17/2011  . CHEST TIGHTNESS 02/18/2008    RINE,KATHRYN 01/28/2016, 9:29 AM Theone Murdoch, OTR/L [phone] Phone: [phone] 9:29 AM 01/28/2016 [facility] [address] [place], [place], [address] Phone: [phone]   Fax:  [phone]  Name: [name] [name] MRN: [id number] Date of Birth: 09/24/1951

## 2016-01-28 NOTE — Therapy (Signed)
[facility] [address] [place], [place], [address] Phone: [phone]   Fax:  [phone]  Speech Language Pathology Treatment  Patient Details  Name: [name] [name] MRN: [id number] Date of Birth: 10-May-1951 Referring Provider: Dr. [name] Tat  Encounter Date: 01/28/2016      End of Session - 01/28/16 1022    Visit Number 12   Number of Visits 17   Authorization Type 25   Authorization - Visit Number 12   SLP Start Time 0932   SLP Stop Time  1016   SLP Time Calculation (min) 44 min   Activity Tolerance Patient tolerated treatment well      Past Medical History  Diagnosis Date  . Headache(784.0)   . Cardiac murmur     as a child  . Streptococcal meningitis     as an infant  . Depression   .  OSA (obstructive sleep apnea) 01/17/2011    npsg 2012:  AHI 67/hr. Auto titration 2012:  Optimal pressure 12cm.   Marland Kitchen HEADACHES, HX OF 02/18/2008    Qualifier: Diagnosis of  By: Danny Lawless CMA, [place]    . APPENDECTOMY, HX OF 02/18/2008    Qualifier: Diagnosis of  By: Danny Lawless CMA, [place]    . Parkinson disease ([place]) 11/2014    Past Surgical History  Procedure Laterality Date  . Nasal sinus surgery      x 4 as a child  . Vasectomy    . Appendectomy  1967    There were no vitals filed for this visit.      Subjective Assessment - 01/28/16 0941    Subjective "I couldn't get my loud ahs right last time."   Currently in Pain? No/denies               ADULT SLP TREATMENT - 01/28/16 0943    General Information   Behavior/Cognition Alert;Cooperative;Pleasant mood   Treatment Provided   Treatment provided Cognitive-Linquistic   Cognitive-Linquistic Treatment   Treatment focused on Dysarthria;Voice   Skilled Treatment Pt with loud /a/ for recalibration of loudness in conversation. Pt had more success today with limited raspiness/hoarseness primarily due to SLP visual cues. Multiple sentence tasks  (semi-structured)  Outdoors, pt had more success with louder voice and better vocal quality, for 12 minute conversation. SLP had no difficulty understanding pt over traffic noise.   Assessment / Recommendations / Plan   Plan Continue with current plan of care   Progression Toward Goals   Progression toward goals Progressing toward goals            SLP Short Term Goals - 01/26/16 1202    SLP SHORT TERM GOAL #1   Time 3   Period Weeks   Status Achieved   SLP SHORT TERM GOAL #2   Title Pt will average 70dB during structured speech tasks and simple conversation with occasional min A   Period Weeks   Status Achieved   SLP SHORT TERM GOAL #3   Title Pt will utlize compensations to reduce coughing on his saliva while at rest, reporting coughing on saliva reduced by 25% subjectively.   Status Achieved          SLP Long Term Goals - 01/28/16 1023    SLP LONG TERM GOAL #1   Title pt to maintain average 70dB in 15 minutes mod complex conversation outside [place] room over two sessions   Time 3   Period Weeks   Status On-going   SLP LONG  TERM GOAL #2   Title Pt will maintain an average of 70dBfor modcomplex conversation over 12 minutes with rare minimal assistance over 2 sessions    Time 3   Period Weeks   Status On-going          Plan - 01/28/16 1022    Clinical Impression Statement Pt required min A to maintain loudness during simple conversation and structured tasks. Conversation outdoors was better than indoors. Continue skilled ST to maximize intelligiblity and carryover of compensations for dysarthria   Speech Therapy Frequency 2x / week   Duration --  3 weeks   Treatment/Interventions SLP instruction and feedback;Internal/external aids;Patient/family education;Functional tasks;Compensatory strategies;Aspiration precaution training   Potential to Achieve Goals Good   Potential Considerations Severity of impairments      Patient will benefit from skilled therapeutic  intervention in order to improve the following deficits and impairments:   Dysarthria and anarthria    Problem List Patient Active Problem List   Diagnosis Date Noted  . Parkinsonian features 09/15/2015  . ED (erectile dysfunction) 09/15/2015  . MDD (major depressive disorder), recurrent episode, severe ([place]) 02/10/2014  . Nonspecific abnormal electrocardiogram (ECG) (EKG) 02/07/2014  . Non-compliant behavior 02/03/2014  . Morton's neuroma of left foot 08/07/2013  . 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 Hyperglycemia 01/26/2012  . Hypogonadism male 01/24/2012  . Syncope and collapse 01/20/2012  .  OSA (obstructive sleep apnea) 01/17/2011  . CHEST TIGHTNESS 02/18/2008    Priseis Cratty ,MS, CCC-SLP  01/28/2016, 1:15 PM  [facility] [address] [place], [place], [address] Phone: [phone]   Fax:  [phone]   Name: [name] [name] MRN: [id number] Date of Birth: [birth year]

## 2016-02-02 ENCOUNTER — Ambulatory Visit: Payer: Federal, State, Local not specified - PPO | Admitting: Occupational Therapy

## 2016-02-02 ENCOUNTER — Ambulatory Visit: Payer: Federal, State, Local not specified - PPO | Attending: Neurology | Admitting: Physical Therapy

## 2016-02-02 ENCOUNTER — Ambulatory Visit: Payer: Federal, State, Local not specified - PPO | Admitting: Speech Pathology

## 2016-02-02 VITALS — BP 128/73 | HR 72

## 2016-02-02 DIAGNOSIS — R471 Dysarthria and anarthria: Secondary | ICD-10-CM | POA: Insufficient documentation

## 2016-02-02 DIAGNOSIS — R278 Other lack of coordination: Secondary | ICD-10-CM | POA: Insufficient documentation

## 2016-02-02 DIAGNOSIS — R29818 Other symptoms and signs involving the nervous system: Secondary | ICD-10-CM

## 2016-02-02 DIAGNOSIS — R2689 Other abnormalities of gait and mobility: Secondary | ICD-10-CM | POA: Insufficient documentation

## 2016-02-02 DIAGNOSIS — R29898 Other symptoms and signs involving the musculoskeletal system: Secondary | ICD-10-CM | POA: Diagnosis present

## 2016-02-02 DIAGNOSIS — R293 Abnormal posture: Secondary | ICD-10-CM | POA: Diagnosis present

## 2016-02-02 NOTE — Therapy (Signed)
[facility] [address] [place], [place], [address] Phone: [phone]   Fax:  [phone]  Physical Therapy Treatment  Patient Details  Name: [name] [name] MRN: [id number] Date of Birth: 23-Nov-1950 Referring Provider: [name] Tat  Encounter Date: 02/02/2016      PT End of Session - 02/02/16 0856    Visit Number 11   Number of Visits 17   Date for PT Re-Evaluation 02/16/16   Authorization Type BCBS FED  75 visit limit combined   PT Start Time 0804   PT Stop Time 0847   PT Time Calculation (min) 43 min   Activity Tolerance Patient tolerated treatment well   Behavior During Therapy [facility] for tasks assessed/performed      Past Medical History  Diagnosis Date  . Headache(784.0)   . Cardiac murmur     as a child  . Streptococcal meningitis     as an infant  . Depression   .  OSA (obstructive sleep apnea) 01/17/2011    npsg 2012:  AHI 67/hr. Auto titration 2012:  Optimal pressure 12cm.   Marland Kitchen HEADACHES, HX OF 02/18/2008    Qualifier: Diagnosis of  By: Danny Lawless CMA, [place]    . APPENDECTOMY, HX OF 02/18/2008    Qualifier: Diagnosis of  By: Danny Lawless CMA, [place]    . Parkinson disease ([place]) 11/2014    Past Surgical History  Procedure Laterality Date  . Nasal sinus surgery      x 4 as a child  . Vasectomy    . Appendectomy  1967    Filed Vitals:   02/02/16 0807  BP: 128/73  Pulse: 72        Subjective Assessment - 02/02/16 0806    Subjective LIttle stiff this morning.  Sometimes able to do the exercises first thing in the morning.   Patient Stated Goals Pt's goal for therapy is to address the shuffling with walking.   Currently in Pain? No/denies                         [facility] Adult PT Treatment/Exercise - 02/02/16 0001    Transfers   Transfers Sit to Stand;Stand to Sit   Sit to Stand 6: Modified independent (Device/Increase time);Without upper extremity assist;From chair/3-in-1    Stand to Sit 6: Modified independent (Device/Increase time);Without upper extremity assist;To chair/3-in-1   Number of Reps Other reps (comment)  5 reps   Ambulation/Gait   Ambulation/Gait Yes   Ambulation/Gait Assistance 5: Supervision   Ambulation/Gait Assistance Details Initial cues for walking poles, reciprocal pattern with poles  Pt has 2 episodes of foot catching with gait.   Ambulation Distance (Feet) 600 Feet  then 100 ft no device   Assistive device --  bilateral walking poles   Gait Pattern Step-through pattern  Good consistency with reciprocal pattern with poles   Ambulation Surface Level;Indoor   Gait Comments After initial cueing with poles to drag poles, then begin reciprocal pattern, pt able to sequence poles well; needs occasional cues for re-sequencing poles after turning/changing directions.   Self-Care   Self-Care Other Self-Care Comments   Other Self-Care Comments  Pt brought in his HEP book-went over individual exercises and discussed importance of consistent exercise/HEP performance.  Discussed/reviewed exercise chart as a means for organizing exercise.  Provided patient with info and discussed [employer] [facility]/scholarships available per [employer]..  Discussed using seated/standing PWR! Moves especially as means of warm up-activity after sitting  too [facility]) - 02/02/16 0827    PWR! exercises Moves in sitting;Moves in standing   PWR! Up 5 reps   PWR! Rock 5 reps   PWR! Twist 5 reps   PWR Step 5 reps   Comments PWR! Sit<>Stand in between each standing PWR! Move position, performed as HEP review, and as means to warm-up prior to gait activities due to concerns of stiffness.  Pt does require cues for technique and sequence of exercise.   PWR! Up 5 reps   PWR! Rock 5 reps   PWR! Twist 5 reps   PWR! Step 5 reps   Comments PWR! Moves in sitting performed as warm-up exercise prior to gait activities due to pt's c/o stiffness              PT Education - 02/02/16 0836    Education provided Yes   Education Details Discussed importance of continued consistent HEP performance; use of exercise chart for prioritizing exercise; [employer] [facility] and scholarship information   Person(s) Educated Patient   Methods Explanation;Handout   Comprehension Verbalized understanding          PT Short Term Goals - 01/12/16 1130    PT SHORT TERM GOAL #1   Title Pt will be independent with HEP for improved bed mobility, transfers, balance and gait.  TARGET 01/15/16   Baseline needs cues for intensity   Time 4   Period Weeks   Status Partially Met   PT SHORT TERM GOAL #2   Title Pt will improve 5x sit<>stand to less than or equal 14 seconds for improved transfer efficiency and safety.   Baseline 17.08 sec 01/11/16   Time 4   Period Weeks   Status Not Met   PT SHORT TERM GOAL #3   Title Pt will improve single limb stance to at least 4 seconds bilateral lower extremities for improved stair and obstacle negotiation.   Baseline 8 seconds RLE, 1 second LLE   Time 4   Period Weeks   Status Partially Met   PT SHORT TERM GOAL #4   Title 3 minute walk test to be completed, with pt able to ambulate at least 50 ft greater than baseline, for improved endurance for long distance gait for work.   Baseline 581 ft at best 01/11/16   Time 4   Period Weeks   Status Not Met   PT SHORT TERM GOAL #5   Title Pt will verbalize at least 25% improvement in bed mobility.   Baseline pt reports still having difficulty and thinks it is due to his mattress   Time 4   Period Weeks   Status Not Met           PT Long Term Goals - 12/17/15 1705    PT LONG TERM GOAL #1   Title Pt will verbalize understanding of fall prevention/tips to reduce freezing with gait.  TARGET 02/15/16   Time 8   Period Weeks   Status New   PT LONG TERM GOAL #2   Title Pt will perform at least 8 of 10 reps of sit<>stand from 18 inch surfaces or lower, with  minimal to no UE support for improved transfer efficiency and safety.   Time 8   Period Weeks   Status New   PT LONG TERM GOAL #3   Title Pt will improve Functional Gait Assessment to at least 19/30 for decreased fall risk.  Time 8   Period Weeks   PT LONG TERM GOAL #4   Title Pt will negotiate at least 4 steps with one handrail, step through pattern, modified independently without loss of balance.   Time 8   Period Weeks   Status New   PT LONG TERM GOAL #5   Title Pt will verbalize understanding of plans for community fitness upon D/C from PT.   Time 8   Period Weeks   Status New               Plan - 02/02/16 1287    Clinical Impression Statement Pt continues to need cueing for optimal performance of PWR! Moves exercises during session for sitting and standing.  Reiterated importance of consistent HEP performance, due to pt's report of inconsistent ex performance of HEP.   Rehab Potential Good   PT Frequency 2x / week   PT Duration 8 weeks  plus eval   PT Treatment/Interventions ADLs/Self Care Home Management;Therapeutic exercise;Therapeutic activities;Functional mobility training;Gait training;Balance training;Neuromuscular re-education   PT Next Visit Plan Continue to work on intensity of movement, coordinated UE/lower extremity activities for improved dynamic gait and balance.  Gait with walking poles; follow up on [facility]   PT Home Exercise Plan Walking program, speed and amplitude with mobility.     Consulted and Agree with Plan of Care Patient      Patient will benefit from skilled therapeutic intervention in order to improve the following deficits and impairments:  Abnormal gait, Decreased balance, Decreased mobility, Decreased strength, Difficulty walking, Impaired flexibility, Postural dysfunction  Visit Diagnosis: Other abnormalities of gait and mobility  Abnormal posture     Problem List Patient Active Problem List   Diagnosis Date Noted  . Parkinsonian  features 09/15/2015  . ED (erectile dysfunction) 09/15/2015  . MDD (major depressive disorder), recurrent episode, severe ([place]) 02/10/2014  . Nonspecific abnormal electrocardiogram (ECG) (EKG) 02/07/2014  . Non-compliant behavior 02/03/2014  . Morton's neuroma of left foot 08/07/2013  . 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 Hyperglycemia 01/26/2012  . Hypogonadism male 01/24/2012  . Syncope and collapse 01/20/2012  .  OSA (obstructive sleep apnea) 01/17/2011  . CHEST TIGHTNESS 02/18/2008    Liem Copenhaver W. 02/02/2016, 8:59 AM  Frazier Butt., PT  [facility] [address] [place], [place], [address] Phone: [phone]   Fax:  [phone]  Name: [name] [name] MRN: [id number] Date of Birth: 27-Jan-1951

## 2016-02-02 NOTE — Therapy (Signed)
[facility] [address] [place], [place], [address] Phone: [phone]   Fax:  [phone]  Occupational Therapy Treatment  Patient Details  Name: [name] [name] MRN: [id number] Date of Birth: 1951/05/28 Referring Provider: Dr. Carles Collet  Encounter Date: 02/02/2016      OT End of Session - 02/02/16 0856    Visit Number 13   Number of Visits 17   Date for OT Re-Evaluation 02/13/16   Authorization Type BCBS-75 visit limit   OT Start Time 0851   OT Stop Time 0930   OT Time Calculation (min) 39 min   Activity Tolerance Patient tolerated treatment well   Behavior During Therapy [facility] for tasks assessed/performed      Past Medical History  Diagnosis Date  . Headache(784.0)   . Cardiac murmur     as a child  . Streptococcal meningitis     as an infant  . Depression   .  OSA (obstructive sleep apnea) 01/17/2011    npsg 2012:  AHI 67/hr. Auto titration 2012:  Optimal pressure 12cm.   Marland Kitchen HEADACHES, HX OF 02/18/2008    Qualifier: Diagnosis of  By: Danny Lawless CMA, [place]    . APPENDECTOMY, HX OF 02/18/2008    Qualifier: Diagnosis of  By: Danny Lawless CMA, [place]    . Parkinson disease ([place]) 11/2014    Past Surgical History  Procedure Laterality Date  . Nasal sinus surgery      x 4 as a child  . Vasectomy    . Appendectomy  1967    There were no vitals filed for this visit.      Subjective Assessment - 02/02/16 0855    Subjective  No reports of pain   Pertinent History dx PD 11/2014, hx of depression, mild cognitive impairment per recent neuropsych cognitive testing   Patient Stated Goals Pt reports increased weakness   Currently in Pain? No/denies           Arm bike x 6 mins level1 for conditioning, pt maintained 40 RPM Donning/ doffing jacket with adapted strategy, x3. Pt. Demonstrates significant improvement in PPT#4 see goals. Dynamic reaching with trunk rotation to place/ remove large pegs from peg board, min  v.c. For techniques               [facility]) - 02/02/16 0901    PWR! exercises Moves in [place]! Up 10   PWR! Rock 10   PWR! Twist 20   PWR! Step 10   Comments min v.c. for large amplitude movements               OT Short Term Goals - 01/29/16 1256    OT SHORT TERM GOAL #1   Title Pt will be independent with updated HEP    Period Weeks   Status Achieved   OT SHORT TERM GOAL #2   Title Pt will demonstrate improved ease with feeding as evidenced by performing PPT#2  in 11 secs or less.   Baseline 14.22   Time 4   Period Weeks   Status On-going  12.75, 12.38   OT SHORT TERM GOAL #3   Title Pt will verbalize understanding of cognitive compensation strategies/ways to promote cognitive skills prn.   Time 4   Period Weeks   Status Achieved   OT SHORT TERM GOAL #4   Title Pt will demo incr ease with dressing as shown by ability to button/unbutton 3 buttons on table in less than  38sec.   Baseline 43.22sec   Time 4   Period Weeks   Status Achieved  25.78 secs   OT SHORT TERM GOAL #5   Title Pt will demonstrate increased ease with dressing as evidenced by decreasing PPT#4  by 5 secs.   Time 4   Period Weeks   Status Achieved  11.59 secs           OT Long Term Goals - 12/16/15 1349    OT LONG TERM GOAL #1   Title Pt will verbalize understanding of updated AE/strategies for ADLs/IADLs prn    Time 8   Status New   OT LONG TERM GOAL #2   Title Pt will improve dressing ability as shown by improving time on PPT#4 by at least 10 sec.   Baseline 40.72 secs   Time 8   Period Weeks   Status New   OT LONG TERM GOAL #3   Title Pt will demonstrate ability to write a paragraph with 100% legibility and no significant decrease in letter size.   Time 8   Period Weeks   Status New   OT LONG TERM GOAL #4   Title Pt will verbalize understanding of ways to prevent future PD-related complications and verbalize understanding of community resources.   Time 8    Period Weeks   Status New   OT LONG TERM GOAL #5   Title -----------------------------------------------------------------------------------------------------------   OT LONG TERM GOAL #6   Title --------------------------------------------------------             Patient will benefit from skilled therapeutic intervention in order to improve the following deficits and impairments:  Decreased coordination, bradykinesia, abnormal posture, cognition, rigidity    Visit Diagnosis: Other lack of coordination  Other symptoms and signs involving the nervous system  Other symptoms and signs involving the musculoskeletal system  Abnormal posture    Problem List Patient Active Problem List   Diagnosis Date Noted  . Parkinsonian features 09/15/2015  . ED (erectile dysfunction) 09/15/2015  . MDD (major depressive disorder), recurrent episode, severe ([place]) 02/10/2014  . Nonspecific abnormal electrocardiogram (ECG) (EKG) 02/07/2014  . Non-compliant behavior 02/03/2014  . Morton's neuroma of left foot 08/07/2013  . 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 Hyperglycemia 01/26/2012  . Hypogonadism male 01/24/2012  . Syncope and collapse 01/20/2012  .  OSA (obstructive sleep apnea) 01/17/2011  . CHEST TIGHTNESS 02/18/2008    RINE,KATHRYN 02/02/2016, 9:04 AM Theone Murdoch, OTR/L [phone] Phone: [phone] 9:31 AM 02/02/2016 [facility] [address] [place], [place], [address] Phone: [phone]   Fax:  [phone]  Name: [name] [name] MRN: [id number] Date of Birth: [birth year]

## 2016-02-02 NOTE — Therapy (Signed)
[facility] [address] [place], [place], [address] Phone: [phone]   Fax:  [phone]  Speech Language Pathology Treatment  Patient Details  Name: [name] [name] MRN: [id number] Date of Birth: 10/08/1950 Referring Provider: Dr. [name] Tat  Encounter Date: 02/02/2016      End of Session - 02/02/16 1017    Visit Number 13   Number of Visits 17   Date for SLP Re-Evaluation 02/10/16   SLP Start Time 0934   SLP Stop Time  1017   SLP Time Calculation (min) 43 min      Past Medical History  Diagnosis Date  . Headache(784.0)   . Cardiac murmur     as a child  . Streptococcal meningitis     as an infant  . Depression   .  OSA (obstructive sleep apnea) 01/17/2011    npsg 2012:  AHI 67/hr. Auto titration 2012:  Optimal pressure 12cm.   Marland Kitchen HEADACHES, HX OF 02/18/2008    Qualifier: Diagnosis of  By: Danny Lawless CMA, [place]    . APPENDECTOMY, HX OF 02/18/2008    Qualifier: Diagnosis of  By: Danny Lawless CMA, [place]    . Parkinson disease ([place]) 11/2014    Past Surgical History  Procedure Laterality Date  . Nasal sinus surgery      x 4 as a child  . Vasectomy    . Appendectomy  1967    There were no vitals filed for this visit.      Subjective Assessment - 02/02/16 0940    Subjective "I think I can do my /a/ today"   Currently in Pain? No/denies               ADULT SLP TREATMENT - 02/02/16 0942    General Information   Behavior/Cognition Alert;Cooperative;Pleasant mood   Treatment Provided   Treatment provided Cognitive-Linquistic   Cognitive-Linquistic Treatment   Treatment focused on Dysarthria;Voice   Skilled Treatment Recalibrated volume with loud /a/ - hoarseness reduced with easier onset, then increased loudness - average 90dB. Structured speech tasks average 70dB with rare min visual cues to maintain loudness.  Simple conversation over 10 minutes  average 70dB with occasional min cues for breath  support and to maintain loudness.    Assessment / Recommendations / Plan   Plan Continue with current plan of care   Progression Toward Goals   Progression toward goals Progressing toward goals          SLP Education - 02/02/16 1013    Education provided Yes   Education Details Breath support for loudness   Person(s) Educated Patient   Methods Explanation;Handout   Comprehension Verbalized understanding          SLP Short Term Goals - 02/02/16 1016    SLP SHORT TERM GOAL #1   Time 3   Period Weeks   Status Achieved   SLP SHORT TERM GOAL #2   Title Pt will average 70dB during structured speech tasks and simple conversation with occasional min A   Period Weeks   Status Achieved   SLP SHORT TERM GOAL #3   Title Pt will utlize compensations to reduce coughing on his saliva while at rest, reporting coughing on saliva reduced by 25% subjectively.   Status Achieved          SLP Long Term Goals - 02/02/16 1016    SLP LONG TERM GOAL #1   Title pt to maintain average 70dB in 15 minutes mod complex  conversation outside [place] room over two sessions   Time 2   Period Weeks   Status On-going   SLP LONG TERM GOAL #2   Title Pt will maintain an average of 70dBfor modcomplex conversation over 12 minutes with rare minimal assistance over 2 sessions    Time 2   Period Weeks   Status On-going          Plan - 02/02/16 1013    Clinical Impression Statement Pt requires min A to maintain loud volume during conversation  - conitnue skilled ST to maximize loudness and intellgibility   Speech Therapy Frequency 2x / week   Treatment/Interventions SLP instruction and feedback;Internal/external aids;Patient/family education;Functional tasks;Compensatory strategies;Aspiration precaution training   Potential to Achieve Goals Good   Potential Considerations Severity of impairments   Consulted and Agree with Plan of Care Patient      Patient will benefit from skilled therapeutic  intervention in order to improve the following deficits and impairments:   Dysarthria    Problem List Patient Active Problem List   Diagnosis Date Noted  . Parkinsonian features 09/15/2015  . ED (erectile dysfunction) 09/15/2015  . MDD (major depressive disorder), recurrent episode, severe ([place]) 02/10/2014  . Nonspecific abnormal electrocardiogram (ECG) (EKG) 02/07/2014  . Non-compliant behavior 02/03/2014  . Morton's neuroma of left foot 08/07/2013  . 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 Hyperglycemia 01/26/2012  . Hypogonadism male 01/24/2012  . Syncope and collapse 01/20/2012  .  OSA (obstructive sleep apnea) 01/17/2011  . CHEST TIGHTNESS 02/18/2008    Tira Lafferty, Annye Rusk MS, CCC-SLP 02/02/2016, 10:18 AM  [facility] [address] [place], [place], [address] Phone: [phone]   Fax:  [phone]   Name: DEEPAK MOOK MRN: [id number] Date of Birth: 1951-07-04

## 2016-02-04 ENCOUNTER — Ambulatory Visit: Payer: Federal, State, Local not specified - PPO | Admitting: Physical Therapy

## 2016-02-04 ENCOUNTER — Ambulatory Visit: Payer: Federal, State, Local not specified - PPO | Admitting: Speech Pathology

## 2016-02-04 ENCOUNTER — Ambulatory Visit: Payer: Federal, State, Local not specified - PPO | Admitting: Occupational Therapy

## 2016-02-04 DIAGNOSIS — R29818 Other symptoms and signs involving the nervous system: Secondary | ICD-10-CM

## 2016-02-04 DIAGNOSIS — R293 Abnormal posture: Secondary | ICD-10-CM

## 2016-02-04 DIAGNOSIS — R471 Dysarthria and anarthria: Secondary | ICD-10-CM

## 2016-02-04 DIAGNOSIS — R2689 Other abnormalities of gait and mobility: Secondary | ICD-10-CM | POA: Diagnosis not present

## 2016-02-04 DIAGNOSIS — R29898 Other symptoms and signs involving the musculoskeletal system: Secondary | ICD-10-CM

## 2016-02-04 DIAGNOSIS — R278 Other lack of coordination: Secondary | ICD-10-CM

## 2016-02-04 NOTE — Therapy (Signed)
[facility] [address] [place], [place], [address] Phone: [phone]   Fax:  [phone]  Occupational Therapy Treatment  Patient Details  Name: [name] [name] MRN: [id number] Date of Birth: [birth year] Referring Provider: Dr. Carles Collet  Encounter Date: 02/04/2016      OT End of Session - 02/04/16 0915    Visit Number 14   Number of Visits 17   Date for OT Re-Evaluation 02/13/16   Authorization Type BCBS-75 visit limit   OT Start Time 0850   OT Stop Time 0930   OT Time Calculation (min) 40 min   Activity Tolerance Patient tolerated treatment well   Behavior During Therapy [facility] for tasks assessed/performed      Past Medical History  Diagnosis Date  . Headache(784.0)   . Cardiac murmur     as a child  . Streptococcal meningitis     as an infant  . Depression   .  OSA (obstructive sleep apnea) 01/17/2011    npsg 2012:  AHI 67/hr. Auto titration 2012:  Optimal pressure 12cm.   Marland Kitchen HEADACHES, HX OF 02/18/2008    Qualifier: Diagnosis of  By: Danny Lawless CMA, [place]    . APPENDECTOMY, HX OF 02/18/2008    Qualifier: Diagnosis of  By: Danny Lawless CMA, [place]    . Parkinson disease ([place]) 11/2014    Past Surgical History  Procedure Laterality Date  . Nasal sinus surgery      x 4 as a child  . Vasectomy    . Appendectomy  1967    There were no vitals filed for this visit.      Subjective Assessment - 02/04/16 0913    Pertinent History dx PD 11/2014, hx of depression, mild cognitive impairment per recent neuropsych cognitive testing   Patient Stated Goals Pt reports increased weakness   Currently in Pain? No/denies            Treatment: arm bike x 6 mins level 1 for conditioning, pt maintained 40RPM Pt practiced donning doffing jacket with adapted strategy and using foot stool for donning and tying shoes, min v.c. PWR! Hands x 10 reps following by handwriting activities with emphasis on larger letter size, min  v.c. Pt practiced using foam grip and he was issued one for home use.                    OT Short Term Goals - 01/29/16 1256    OT SHORT TERM GOAL #1   Title Pt will be independent with updated HEP    Period Weeks   Status Achieved   OT SHORT TERM GOAL #2   Title Pt will demonstrate improved ease with feeding as evidenced by performing PPT#2  in 11 secs or less.   Baseline 14.22   Time 4   Period Weeks   Status On-going  12.75, 12.38   OT SHORT TERM GOAL #3   Title Pt will verbalize understanding of cognitive compensation strategies/ways to promote cognitive skills prn.   Time 4   Period Weeks   Status Achieved   OT SHORT TERM GOAL #4   Title Pt will demo incr ease with dressing as shown by ability to button/unbutton 3 buttons on table in less than 38sec.   Baseline 43.22sec   Time 4   Period Weeks   Status Achieved  25.78 secs   OT SHORT TERM GOAL #5   Title Pt will demonstrate increased ease with dressing as evidenced by  decreasing PPT#4  by 5 secs.   Time 4   Period Weeks   Status Achieved  11.59 secs           OT Long Term Goals - 02/04/16 0909    OT LONG TERM GOAL #1   Title Pt will verbalize understanding of updated AE/strategies for ADLs/IADLs prn    Time 8   Status On-going   OT LONG TERM GOAL #2   Title Pt will improve dressing ability as shown by improving time on PPT#4 by at least 10 sec.   Baseline 40.72 secs   Time 8   Period Weeks   Status Achieved  12.53 secs   OT LONG TERM GOAL #3   Title Pt will demonstrate ability to write a paragraph with 100% legibility and no significant decrease in letter size.   Time 8   Period Weeks   Status On-going   OT LONG TERM GOAL #4   Title Pt will verbalize understanding of ways to prevent future PD-related complications and verbalize understanding of community resources.   Time 8   Period Weeks   Status On-going   OT LONG TERM GOAL #5   Title  -----------------------------------------------------------------------------------------------------------   OT LONG TERM GOAL #6   Title --------------------------------------------------------               Plan - 02/04/16 [id number]    Clinical Impression Statement Pt is progressing towards goals for ADLS/ handwriting. Pt agrees with plans to d/c next week.   Rehab Potential Good   OT Frequency 2x / week   OT Duration 8 weeks   OT Treatment/Interventions Self-care/ADL training;Moist Heat;DME and/or AE instruction;Splinting;Patient/family education;Balance training;Therapeutic exercises;Therapeutic exercise;Therapeutic activities;Cognitive remediation/compensation;Passive range of motion;Functional Mobility Training;Neuromuscular education;Cryotherapy;Electrical Stimulation;Energy conservation;Manual Therapy      Patient will benefit from skilled therapeutic intervention in order to improve the following deficits and impairments:  Decreased coordination, Decreased endurance, Decreased activity tolerance, Impaired tone, Impaired UE functional use, Decreased knowledge of use of DME, Decreased balance, Decreased cognition, Decreased mobility  Visit Diagnosis: Other lack of coordination  Other symptoms and signs involving the nervous system  Other symptoms and signs involving the musculoskeletal system  Abnormal posture    Problem List Patient Active Problem List   Diagnosis Date Noted  . Parkinsonian features 09/15/2015  . ED (erectile dysfunction) 09/15/2015  . MDD (major depressive disorder), recurrent episode, severe ([place]) 02/10/2014  . Nonspecific abnormal electrocardiogram (ECG) (EKG) 02/07/2014  . Non-compliant behavior 02/03/2014  . Morton's neuroma of left foot 08/07/2013  . 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 Hyperglycemia 01/26/2012  . Hypogonadism male 01/24/2012  . Syncope and collapse 01/20/2012  .  OSA (obstructive sleep  apnea) 01/17/2011  . CHEST TIGHTNESS 02/18/2008    RINE,KATHRYN 02/04/2016, 12:49 PM  [facility] [address] [place], [place], [address] Phone: [phone]   Fax:  [phone]  Name: SHERON ORTT MRN: [id number] Date of Birth: 06/20/1951

## 2016-02-04 NOTE — Therapy (Signed)
[facility] [address] [place], [place], [address] Phone: [phone]   Fax:  [phone]  Physical Therapy Treatment  Patient Details  Name: [name] [name] MRN: [id number] Date of Birth: [birth year] Referring Provider: [name] Tat  Encounter Date: 02/04/2016      PT End of Session - 02/04/16 2236    Visit Number 12   Number of Visits 17   Date for PT Re-Evaluation 02/16/16   Authorization Type BCBS FED  75 visit limit combined   PT Start Time 0804   PT Stop Time 0844   PT Time Calculation (min) 40 min   Activity Tolerance Patient tolerated treatment well   Behavior During Therapy [facility] for tasks assessed/performed      Past Medical History  Diagnosis Date  . Headache(784.0)   . Cardiac murmur     as a child  . Streptococcal meningitis     as an infant  . Depression   .  OSA (obstructive sleep apnea) 01/17/2011    npsg 2012:  AHI 67/hr. Auto titration 2012:  Optimal pressure 12cm.   Marland Kitchen HEADACHES, HX OF 02/18/2008    Qualifier: Diagnosis of  By: Danny Lawless CMA, [place]    . APPENDECTOMY, HX OF 02/18/2008    Qualifier: Diagnosis of  By: Danny Lawless CMA, [place]    . Parkinson disease ([place]) 11/2014    Past Surgical History  Procedure Laterality Date  . Nasal sinus surgery      x 4 as a child  . Vasectomy    . Appendectomy  1967    There were no vitals filed for this visit.      Subjective Assessment - 02/04/16 0806    Subjective Pt brings in walking poles today-wants to get them adjusted to the right height.  Got them from [facility].  Have not yet called [facility]   Currently in Pain? No/denies                         [facility] Adult PT Treatment/Exercise - 02/04/16 0807    Ambulation/Gait   Ambulation/Gait Yes   Ambulation/Gait Assistance 6: Modified independent (Device/Increase time)   Ambulation/Gait Assistance Details Provided cues for sequencing walking poles, how to reset sequence of walking  poles, cues for increased step length.   Ambulation Distance (Feet) 1500 Feet   Assistive device --  bilateral walking poles   Gait Pattern Step-through pattern   Ambulation Surface Level;Unlevel;Indoor;Outdoor   [employer] Activities Checked height of walking poles-discussed/demo walking pole technique   Gait Comments Pt has several episodes of getting out of sequence with walking poles and able to reset proper sequence.       Neuro Re-education: As a progression of current HEP, pt performs PWR! Moves in standing utilizing bilateral walking poles: -PWR! Upx 10 -PWR! Rock x 10 -PWR! Twist x 10 -PWR! Step x 10  PT provides visual and verbal cues for sequencing/using walking poles to coordinate with UE activity.  -Dynamic gait and balance exercises 20 ft, 3-5 reps each, with addition of coordinated arm movements  -Marching forward and back -Walking forward and back -Sidestepping            PT Education - 02/04/16 2235    Education provided Yes   Education Details Proper use of walking poles   Person(s) Educated Patient   Methods Explanation;Demonstration   Comprehension Verbalized understanding;Returned demonstration          PT Short Term Goals -  01/12/16 1130    PT SHORT TERM GOAL #1   Title Pt will be independent with HEP for improved bed mobility, transfers, balance and gait.  TARGET 01/15/16   Baseline needs cues for intensity   Time 4   Period Weeks   Status Partially Met   PT SHORT TERM GOAL #2   Title Pt will improve 5x sit<>stand to less than or equal 14 seconds for improved transfer efficiency and safety.   Baseline 17.08 sec 01/11/16   Time 4   Period Weeks   Status Not Met   PT SHORT TERM GOAL #3   Title Pt will improve single limb stance to at least 4 seconds bilateral lower extremities for improved stair and obstacle negotiation.   Baseline 8 seconds RLE, 1 second LLE   Time 4   Period Weeks   Status Partially Met   PT SHORT TERM GOAL #4    Title 3 minute walk test to be completed, with pt able to ambulate at least 50 ft greater than baseline, for improved endurance for long distance gait for work.   Baseline 581 ft at best 01/11/16   Time 4   Period Weeks   Status Not Met   PT SHORT TERM GOAL #5   Title Pt will verbalize at least 25% improvement in bed mobility.   Baseline pt reports still having difficulty and thinks it is due to his mattress   Time 4   Period Weeks   Status Not Met           PT Long Term Goals - 12/17/15 1705    PT LONG TERM GOAL #1   Title Pt will verbalize understanding of fall prevention/tips to reduce freezing with gait.  TARGET 02/15/16   Time 8   Period Weeks   Status New   PT LONG TERM GOAL #2   Title Pt will perform at least 8 of 10 reps of sit<>stand from 18 inch surfaces or lower, with minimal to no UE support for improved transfer efficiency and safety.   Time 8   Period Weeks   Status New   PT LONG TERM GOAL #3   Title Pt will improve Functional Gait Assessment to at least 19/30 for decreased fall risk.   Time 8   Period Weeks   PT LONG TERM GOAL #4   Title Pt will negotiate at least 4 steps with one handrail, step through pattern, modified independently without loss of balance.   Time 8   Period Weeks   Status New   PT LONG TERM GOAL #5   Title Pt will verbalize understanding of plans for community fitness upon D/C from PT.   Time 8   Period Weeks   Status New               Plan - 02/04/16 2237    Clinical Impression Statement Focused session today on large amplitude walking pattern using bilateral walking poles.  Pt able to get into proper sequence with verbal cues, and able to reset sequence with verbal cues.  Overall, walking poles seem to be a good fit for patient and he is interested in using them at home.  He does have difficulty upper and lower body sequencing of dynamic walking/balance tasks during session today.   Rehab Potential Good   PT Frequency 2x / week    PT Duration 8 weeks  plus eval   PT Treatment/Interventions ADLs/Self Care Home Management;Therapeutic exercise;Therapeutic activities;Functional mobility training;Gait training;Balance  training;Neuromuscular re-education   PT Next Visit Plan Continue to work on intensity of movement, coordinated UE/lower extremity activities for improved dynamic gait and balance.  Gait with walking poles; follow up on [facility]   PT Home Exercise Plan Review walking poles, begin checking goals and plan for D/C next week.   Consulted and Agree with Plan of Care Patient      Patient will benefit from skilled therapeutic intervention in order to improve the following deficits and impairments:  Abnormal gait, Decreased balance, Decreased mobility, Decreased strength, Difficulty walking, Impaired flexibility, Postural dysfunction  Visit Diagnosis: Other abnormalities of gait and mobility  Abnormal posture     Problem List Patient Active Problem List   Diagnosis Date Noted  . Parkinsonian features 09/15/2015  . ED (erectile dysfunction) 09/15/2015  . MDD (major depressive disorder), recurrent episode, severe ([place]) 02/10/2014  . Nonspecific abnormal electrocardiogram (ECG) (EKG) 02/07/2014  . Non-compliant behavior 02/03/2014  . Morton's neuroma of left foot 08/07/2013  . 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 Hyperglycemia 01/26/2012  . Hypogonadism male 01/24/2012  . Syncope and collapse 01/20/2012  .  OSA (obstructive sleep apnea) 01/17/2011  . CHEST TIGHTNESS 02/18/2008    MARRIOTT,AMY W. 02/04/2016, 10:42 PM  [facility]razier Butt., PT  [facility] [address] [place], [place], [address] Phone: [phone]   Fax:  [phone]  Name: ESTEFANO VICTORY MRN: [id number] Date of Birth: 1950/11/03

## 2016-02-04 NOTE — Therapy (Signed)
[facility] [address] [place], [place], [address] Phone: [phone]   Fax:  [phone]  Speech Language Pathology Treatment  Patient Details  Name: [name] [name] MRN: [id number] Date of Birth: Nov 25, 1950 Referring Provider: Dr. [name] Tat  Encounter Date: 02/04/2016      End of Session - 02/04/16 1012    Visit Number 14   Number of Visits 17   Date for SLP Re-Evaluation 02/10/16   Authorization - Visit Number 14   Authorization - Number of Visits 25   SLP Start Time [id number]   SLP Stop Time  1013   SLP Time Calculation (min) 40 min      Past Medical History  Diagnosis Date  . Headache(784.0)   . Cardiac murmur     as a child  . Streptococcal meningitis     as an infant  . Depression   .  OSA (obstructive sleep apnea) 01/17/2011    npsg 2012:  AHI 67/hr. Auto titration 2012:  Optimal pressure 12cm.   Marland Kitchen HEADACHES, HX OF 02/18/2008    Qualifier: Diagnosis of  By: Danny Lawless CMA, [place]    . APPENDECTOMY, HX OF 02/18/2008    Qualifier: Diagnosis of  By: Danny Lawless CMA, [place]    . Parkinson disease ([place]) 11/2014    Past Surgical History  Procedure Laterality Date  . Nasal sinus surgery      x 4 as a child  . Vasectomy    . Appendectomy  1967    There were no vitals filed for this visit.      Subjective Assessment - 02/04/16 0940    Subjective "At home I'm nice and loud"   Currently in Pain? No/denies               ADULT SLP TREATMENT - 02/04/16 0941    General Information   Behavior/Cognition Alert;Cooperative;Pleasant mood   Treatment Provided   Treatment provided Cognitive-Linquistic   Cognitive-Linquistic Treatment   Treatment focused on Dysarthria;Voice   Skilled Treatment Loudness recalibrated with loud /a/ average of 90dB with rare min A and minimal hoarseness.  Pt maintained audible and intelligible speech walking outdside of clinic over 20 minute conversation. Pt raised volume  when we neared loud traffic with rare min A.    Assessment / Recommendations / Plan   Plan Continue with current plan of care          SLP Education - 02/04/16 1009    Education provided Yes   Education Details increase loud volume when in noisy environment   Person(s) Educated Patient   Methods Explanation;Handout   Comprehension Verbalized understanding          SLP Short Term Goals - 02/04/16 1011    SLP SHORT TERM GOAL #1   Time 3   Period Weeks   Status Achieved   SLP SHORT TERM GOAL #2   Title Pt will average 70dB during structured speech tasks and simple conversation with occasional min A   Period Weeks   Status Achieved   SLP SHORT TERM GOAL #3   Title Pt will utlize compensations to reduce coughing on his saliva while at rest, reporting coughing on saliva reduced by 25% subjectively.   Status Achieved          SLP Long Term Goals - 02/04/16 1012    SLP LONG TERM GOAL #1   Title pt to maintain average 70dB in 15 minutes mod complex conversation outside [place] room over  two sessions   Time 2   Period Weeks   Status Achieved   SLP LONG TERM GOAL #2   Title Pt will maintain an average of 70dBfor modcomplex conversation over 12 minutes with rare minimal assistance over 2 sessions    Time 2   Period Weeks   Status On-going          Plan - 02/04/16 1009    Clinical Impression Statement Pt with improved loudness walking outside of clinic 100% intellgible  - continue skilled ST 2 more visists to maximize carryover of compensations for dysarthria in a vareity of settings.    Speech Therapy Frequency 2x / week   Treatment/Interventions SLP instruction and feedback;Internal/external aids;Patient/family education;Functional tasks;Compensatory strategies;Aspiration precaution training   Potential to Achieve Goals Good   Potential Considerations Severity of impairments   Consulted and Agree with Plan of Care Patient      Patient will benefit from skilled  therapeutic intervention in order to improve the following deficits and impairments:   Dysarthria and anarthria    Problem List Patient Active Problem List   Diagnosis Date Noted  . Parkinsonian features 09/15/2015  . ED (erectile dysfunction) 09/15/2015  . MDD (major depressive disorder), recurrent episode, severe ([place]) 02/10/2014  . Nonspecific abnormal electrocardiogram (ECG) (EKG) 02/07/2014  . Non-compliant behavior 02/03/2014  . Morton's neuroma of left foot 08/07/2013  . 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 Hyperglycemia 01/26/2012  . Hypogonadism male 01/24/2012  . Syncope and collapse 01/20/2012  .  OSA (obstructive sleep apnea) 01/17/2011  . CHEST TIGHTNESS 02/18/2008    Maleek Craver, Annye Rusk MS, CCC-SLP 02/04/2016, 10:15 AM  [facility] [address] [place], [place], [address] Phone: [phone]   Fax:  [phone]   Name: [name] [name] MRN: [id number] Date of Birth: [birth year]

## 2016-02-09 ENCOUNTER — Encounter: Payer: Self-pay | Admitting: Speech Pathology

## 2016-02-09 ENCOUNTER — Encounter: Payer: Self-pay | Admitting: Occupational Therapy

## 2016-02-09 ENCOUNTER — Ambulatory Visit: Payer: Federal, State, Local not specified - PPO | Admitting: Speech Pathology

## 2016-02-09 ENCOUNTER — Ambulatory Visit: Payer: Federal, State, Local not specified - PPO | Admitting: Physical Therapy

## 2016-02-09 ENCOUNTER — Ambulatory Visit: Payer: Federal, State, Local not specified - PPO | Admitting: Occupational Therapy

## 2016-02-09 DIAGNOSIS — R2689 Other abnormalities of gait and mobility: Secondary | ICD-10-CM

## 2016-02-09 DIAGNOSIS — R29898 Other symptoms and signs involving the musculoskeletal system: Secondary | ICD-10-CM

## 2016-02-09 DIAGNOSIS — R293 Abnormal posture: Secondary | ICD-10-CM

## 2016-02-09 DIAGNOSIS — R471 Dysarthria and anarthria: Secondary | ICD-10-CM

## 2016-02-09 DIAGNOSIS — R29818 Other symptoms and signs involving the nervous system: Secondary | ICD-10-CM

## 2016-02-09 NOTE — Therapy (Signed)
[facility] [address] [place], [place], [address] Phone: [phone]   Fax:  [phone]  Physical Therapy Treatment  Patient Details  Name: [name] [name] MRN: [id number] Date of Birth: Jun 12, 1951 Referring Provider: [name] Tat  Encounter Date: 02/09/2016      PT End of Session - 02/09/16 1309    Visit Number 13   Number of Visits 17   Date for PT Re-Evaluation 02/16/16   Authorization Type BCBS FED  75 visit limit combined   PT Start Time 0803   PT Stop Time 0845   PT Time Calculation (min) 42 min   Activity Tolerance Patient tolerated treatment well   Behavior During Therapy [facility] for tasks assessed/performed      Past Medical History  Diagnosis Date  . Headache(784.0)   . Cardiac murmur     as a child  . Streptococcal meningitis     as an infant  . Depression   .  OSA (obstructive sleep apnea) 01/17/2011    npsg 2012:  AHI 67/hr. Auto titration 2012:  Optimal pressure 12cm.   Marland Kitchen HEADACHES, HX OF 02/18/2008    Qualifier: Diagnosis of  By: Danny Lawless CMA, [place]    . APPENDECTOMY, HX OF 02/18/2008    Qualifier: Diagnosis of  By: Danny Lawless CMA, [place]    . Parkinson disease ([place]) 11/2014    Past Surgical History  Procedure Laterality Date  . Nasal sinus surgery      x 4 as a child  . Vasectomy    . Appendectomy  1967    There were no vitals filed for this visit.      Subjective Assessment - 02/09/16 0805    Subjective Didn't bring in the walking poles today to therapy since it's raining.  Did use the poles several times over the weekend, for 10-15 minutes at a time.   Patient Stated Goals Pt's goal for therapy is to address the shuffling with walking.   Currently in Pain? No/denies                         [facility] Adult PT Treatment/Exercise - 02/09/16 0806    Transfers   Transfers Sit to Stand;Stand to Sit   Sit to Stand 6: Modified independent (Device/Increase time);Without  upper extremity assist;From chair/3-in-1   Stand to Sit 6: Modified independent (Device/Increase time);Without upper extremity assist;To chair/3-in-1   Number of Reps Other sets (comment);10 reps  10 reps each, from 22, 18, 16 inch surfaces   Comments Cues for practice/demo simulated car transfer, with cues to use PWR! Move step in sitting for ease of getting feet into car.  Practiced sit<>stand from lower (16") surface x 5 reps, with cues to use momentum for increased ease of transfer.   High Level Balance   High Level Balance Comments Standing lateral weightshifting x 10 reps, then marching x 10 reps.    as means to counteract freezing episodes   Self-Care   Self-Care Other Self-Care Comments   Other Self-Care Comments  Discussed/reviewed walking pole use.  Discussed tips to reduce freezing episodes with gait:  has episodes where his mind is racing, thinking of moving, but his feet don't move.  Provided patient with strategies to reduce freezing episodes.  Followed up about ACT-pt has not had a chance to call yet.   Lumbar Exercises: Aerobic   Stationary Bike SciFit, Level 5.0, 4 extremities x 8 minutes  Cues to incr. RPM  55>75 (rating work effort>6/10)      Discussed work level effort as a means to improve intensity of exercise-pt has exercise bike at home and is interested in using this method with his bike.     Self-Care continued-Discussed plans to discharge next visit; pt in agreement.      PT Education - 02/09/16 [phone]    Education provided Yes   Education Details Review/discussion of tips to reduce freezing with gait; rating work effort level with aerobic machines for exercise (as 6-8/10 work effort level)   Person(s) Educated Patient   Methods Explanation;Demonstration;Handout   Comprehension Verbalized understanding;Returned demonstration          PT Short Term Goals - 01/12/16 1130    PT SHORT TERM GOAL #1   Title Pt will be independent with HEP for improved bed  mobility, transfers, balance and gait.  TARGET 01/15/16   Baseline needs cues for intensity   Time 4   Period Weeks   Status Partially Met   PT SHORT TERM GOAL #2   Title Pt will improve 5x sit<>stand to less than or equal 14 seconds for improved transfer efficiency and safety.   Baseline 17.08 sec 01/11/16   Time 4   Period Weeks   Status Not Met   PT SHORT TERM GOAL #3   Title Pt will improve single limb stance to at least 4 seconds bilateral lower extremities for improved stair and obstacle negotiation.   Baseline 8 seconds RLE, 1 second LLE   Time 4   Period Weeks   Status Partially Met   PT SHORT TERM GOAL #4   Title 3 minute walk test to be completed, with pt able to ambulate at least 50 ft greater than baseline, for improved endurance for long distance gait for work.   Baseline 581 ft at best 01/11/16   Time 4   Period Weeks   Status Not Met   PT SHORT TERM GOAL #5   Title Pt will verbalize at least 25% improvement in bed mobility.   Baseline pt reports still having difficulty and thinks it is due to his mattress   Time 4   Period Weeks   Status Not Met           PT Long Term Goals - 02/09/16 9678    PT LONG TERM GOAL #1   Title Pt will verbalize understanding of fall prevention/tips to reduce freezing with gait.  TARGET 02/15/16   Time 8   Period Weeks   Status Achieved   PT LONG TERM GOAL #2   Title Pt will perform at least 8 of 10 reps of sit<>stand from 18 inch surfaces or lower, with minimal to no UE support for improved transfer efficiency and safety.   Time 8   Period Weeks   Status New   PT LONG TERM GOAL #3   Title Pt will improve Functional Gait Assessment to at least 19/30 for decreased fall risk.   Time 8   Period Weeks   PT LONG TERM GOAL #4   Title Pt will negotiate at least 4 steps with one handrail, step through pattern, modified independently without loss of balance.   Time 8   Period Weeks   Status New   PT LONG TERM GOAL #5   Title Pt will  verbalize understanding of plans for community fitness upon D/C from PT.   Time 8   Period Weeks   Status Achieved  Plan - 02/09/16 1310    Clinical Impression Statement Focused session today on transfer training, beginning goal check in prep for discharge next week.  Pt has met LTG #1 and #5.  Pt requires occasional verbal cues for large amplitude movement patterns and increased intensity of movement.  Pt will be appropriate for discharge next visit.   Rehab Potential Good   PT Frequency 2x / week   PT Duration 8 weeks  plus eval   PT Treatment/Interventions ADLs/Self Care Home Management;Therapeutic exercise;Therapeutic activities;Functional mobility training;Gait training;Balance training;Neuromuscular re-education   PT Next Visit Plan Check remaining goals, discharge/g-code next visit; plan follow up screen or eval in 6-9 months   PT Home Exercise Plan Review walking poles, begin checking goals and plan for D/C next week.   Consulted and Agree with Plan of Care Patient      Patient will benefit from skilled therapeutic intervention in order to improve the following deficits and impairments:  Abnormal gait, Decreased balance, Decreased mobility, Decreased strength, Difficulty walking, Impaired flexibility, Postural dysfunction  Visit Diagnosis: Other abnormalities of gait and mobility  Abnormal posture     Problem List Patient Active Problem List   Diagnosis Date Noted  . Parkinsonian features 09/15/2015  . ED (erectile dysfunction) 09/15/2015  . MDD (major depressive disorder), recurrent episode, severe ([place]) 02/10/2014  . Nonspecific abnormal electrocardiogram (ECG) (EKG) 02/07/2014  . Non-compliant behavior 02/03/2014  . Morton's neuroma of left foot 08/07/2013  . 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 Hyperglycemia 01/26/2012  . Hypogonadism male 01/24/2012  . Syncope and collapse 01/20/2012  .  OSA (obstructive  sleep apnea) 01/17/2011  . CHEST TIGHTNESS 02/18/2008    Rozella Servello W. 02/09/2016, 2:11 PM  Frazier Butt., PT  [facility] [address] [place], [place], [address] Phone: [phone]   Fax:  [phone]  Name: DINO BORNTREGER MRN: [id number] Date of Birth: 10/28/1950

## 2016-02-09 NOTE — Therapy (Signed)
[facility] [address] [place], [place], [address] Phone: [phone]   Fax:  [phone]  Speech Language Pathology Treatment  Patient Details  Name: [name] [name] MRN: [id number] Date of Birth: [birth year] Referring Provider: Dr. [name] Tat  Encounter Date: 02/09/2016      End of Session - 02/09/16 0933    Visit Number 15   Number of Visits 17   Date for SLP Re-Evaluation 02/10/16   Authorization - Visit Number 15   Authorization - Number of Visits 25   SLP Start Time [id number]   SLP Stop Time  0933   SLP Time Calculation (min) 46 min   Activity Tolerance Patient tolerated treatment well      Past Medical History  Diagnosis Date  . Headache(784.0)   . Cardiac murmur     as a child  . Streptococcal meningitis     as an infant  . Depression   .  OSA (obstructive sleep apnea) 01/17/2011    npsg 2012:  AHI 67/hr. Auto titration 2012:  Optimal pressure 12cm.   Marland Kitchen HEADACHES, HX OF 02/18/2008    Qualifier: Diagnosis of  By: Danny Lawless CMA, [place]    . APPENDECTOMY, HX OF 02/18/2008    Qualifier: Diagnosis of  By: Danny Lawless CMA, [place]    . Parkinson disease ([place]) 11/2014    Past Surgical History  Procedure Laterality Date  . Nasal sinus surgery      x 4 as a child  . Vasectomy    . Appendectomy  1967    There were no vitals filed for this visit.      Subjective Assessment - 02/09/16 0854    Subjective "I'm talking in a loud voice now"   Currently in Pain? No/denies               ADULT SLP TREATMENT - 02/09/16 0854    General Information   Behavior/Cognition Alert;Cooperative;Pleasant mood   Treatment Provided   Treatment provided Cognitive-Linquistic   Cognitive-Linquistic Treatment   Treatment focused on Dysarthria;Voice   Skilled Treatment Loud /a/ average of 95dB to recalibrate loud volume. Simple conversation in mildly noisy environment over  20 minutes with average 70db with rare min A  for breath support and loudness   Assessment / Recommendations / Plan   Plan Continue with current plan of care          SLP Education - 02/09/16 0930    Education provided Yes   Education Details Continue loud /a/ upon graduation,    Person(s) Educated Patient   Methods Demonstration;Handout   Comprehension Verbalized understanding;Returned demonstration          SLP Short Term Goals - 02/09/16 0932    SLP SHORT TERM GOAL #1   Time 3   Period Weeks   Status Achieved   SLP SHORT TERM GOAL #2   Title Pt will average 70dB during structured speech tasks and simple conversation with occasional min A   Period Weeks   Status Achieved   SLP SHORT TERM GOAL #3   Title Pt will utlize compensations to reduce coughing on his saliva while at rest, reporting coughing on saliva reduced by 25% subjectively.   Status Achieved          SLP Long Term Goals - 02/09/16 0932    SLP LONG TERM GOAL #1   Title pt to maintain average 70dB in 15 minutes mod complex conversation outside [place] room over two sessions  Time 1   Period Weeks   Status Achieved   SLP LONG TERM GOAL #2   Title Pt will maintain an average of 70dBfor modcomplex conversation over 12 minutes with rare minimal assistance over 2 sessions    Time 1   Period Weeks   Status On-going          Plan - 02/09/16 0931    Clinical Impression Statement Continue skilled ST 1 more session to maximize carryover of loud volume and maximize intelligiblity   Speech Therapy Frequency 2x / week   Treatment/Interventions SLP instruction and feedback;Internal/external aids;Patient/family education;Functional tasks;Compensatory strategies;Aspiration precaution training   Potential to Achieve Goals Good   Potential Considerations Severity of impairments      Patient will benefit from skilled therapeutic intervention in order to improve the following deficits and impairments:   Dysarthria and anarthria    Problem List Patient  Active Problem List   Diagnosis Date Noted  . Parkinsonian features 09/15/2015  . ED (erectile dysfunction) 09/15/2015  . MDD (major depressive disorder), recurrent episode, severe ([place]) 02/10/2014  . Nonspecific abnormal electrocardiogram (ECG) (EKG) 02/07/2014  . Non-compliant behavior 02/03/2014  . Morton's neuroma of left foot 08/07/2013  . 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 Hyperglycemia 01/26/2012  . Hypogonadism male 01/24/2012  . Syncope and collapse 01/20/2012  .  OSA (obstructive sleep apnea) 01/17/2011  . CHEST TIGHTNESS 02/18/2008    Chalise Pe, Annye Rusk MS, CCC-SLP 02/09/2016, 9:34 AM  [facility] [address] [place], [place], [address] Phone: [phone]   Fax:  [phone]   Name: [name] [name] MRN: [id number] Date of Birth: 23-Feb-1951

## 2016-02-09 NOTE — Patient Instructions (Signed)
  Consider Constant Therapy App on iPad or tablet  Consider external reminders such as stickers, post-its for shutting doors, lights, etc

## 2016-02-09 NOTE — Therapy (Addendum)
[facility] [address] [place], [place], [address] Phone: [phone]   Fax:  [phone]  Occupational Therapy Treatment  Patient Details  Name: [name] [name] MRN: [id number] Date of Birth: 06-Aug-1951 Referring Provider: Dr. Carles Collet  Encounter Date: 02/09/2016      OT End of Session - 02/11/16 1243    Visit Number 15   Number of Visits 17   Date for OT Re-Evaluation 02/13/16   Authorization Type BCBS-75 visit limit   OT Start Time 0935   OT Stop Time 1015   OT Time Calculation (min) 40 min   Activity Tolerance Patient tolerated treatment well   Behavior During Therapy [facility] for tasks assessed/performed      Past Medical History  Diagnosis Date  . Headache(784.0)   . Cardiac murmur     as a child  . Streptococcal meningitis     as an infant  . Depression   .  OSA (obstructive sleep apnea) 01/17/2011    npsg 2012:  AHI 67/hr. Auto titration 2012:  Optimal pressure 12cm.   Marland Kitchen HEADACHES, HX OF 02/18/2008    Qualifier: Diagnosis of  By: Danny Lawless CMA, [place]    . APPENDECTOMY, HX OF 02/18/2008    Qualifier: Diagnosis of  By: Danny Lawless CMA, [place]    . Parkinson disease ([place]) 11/2014    Past Surgical History  Procedure Laterality Date  . Nasal sinus surgery      x 4 as a child  . Vasectomy    . Appendectomy  1967    There were no vitals filed for this visit.      Subjective Assessment - 02/11/16 1234    Pertinent History dx PD 11/2014, hx of depression, mild cognitive impairment per recent neuropsych cognitive testing   Patient Stated Goals Pt reports increased weakness, Pt denies pain          Therapist started checking progress towards goals. Therapist discussed community resources and ways to prevent future PD related complications. Pt verbalized understanding  Pt practiced donning / doffing jacket with larger amplitude movements with improved performance x 3. Arm bike x 6 mins level 1 for  conditioning pt maintained 40 RPM. Pt practice writing using adapted strategies. He was able to write 3 sentences with 100% legibility and no significant decrease in letter size.                  OT Short Term Goals - 02/09/16 0941    OT SHORT TERM GOAL #1   Title Pt will be independent with updated HEP    Period Weeks   Status Achieved   OT SHORT TERM GOAL #2   Title Pt will demonstrate improved ease with feeding as evidenced by performing PPT#2  in 11 secs or less.   Baseline 14.22   Time 4   Period Weeks   Status Not Met   improved but not met 11.34 secs, 11.12 secs   OT SHORT TERM GOAL #3   Title Pt will verbalize understanding of cognitive compensation strategies/ways to promote cognitive skills prn.   Time 4   Period Weeks   Status Achieved   OT SHORT TERM GOAL #4   Title Pt will demo incr ease with dressing as shown by ability to button/unbutton 3 buttons on table in less than 38sec.   Baseline 43.22sec   Time 4   Period Weeks   Status Achieved  25.78 secs   OT SHORT TERM GOAL #5  Title Pt will demonstrate increased ease with dressing as evidenced by decreasing PPT#4  by 5 secs.   Time 4   Period Weeks   Status Achieved  11.59 secs           OT Long Term Goals - 02/11/16 1039    OT LONG TERM GOAL #1   Title Pt will verbalize understanding of updated AE/strategies for ADLs/IADLs prn    Time 8   Status Achieved   OT LONG TERM GOAL #2   Title Pt will improve dressing ability as shown by improving time on PPT#4 by at least 10 sec.   Baseline 40.72 secs   Time 8   Period Weeks   Status Achieved  12.53 secs   OT LONG TERM GOAL #3   Title Pt will demonstrate ability to write a paragraph with 100% legibility and no significant decrease in letter size.   Time 8   Period Weeks   Status Achieved   OT LONG TERM GOAL #4   Title Pt will verbalize understanding of ways to prevent future PD-related complications and verbalize understanding of community  resources.   Time 8   Period Weeks   Status Achieved   OT LONG TERM GOAL #5   Title -----------------------------------------------------------------------------------------------------------   OT LONG TERM GOAL #6   Title --------------------------------------------------------               Plan - 02/11/16 1239    Clinical Impression Statement Pt is progressing towards goals. He agrees with plans for d/c next visit.   Rehab Potential Good   OT Frequency 2x / week   OT Duration 8 weeks   OT Treatment/Interventions Self-care/ADL training;Moist Heat;DME and/or AE instruction;Splinting;Patient/family education;Balance training;Therapeutic exercises;Therapeutic exercise;Therapeutic activities;Cognitive remediation/compensation;Passive range of motion;Functional Mobility Training;Neuromuscular education;Cryotherapy;Electrical Stimulation;Energy conservation;Manual Therapy   Plan d/c next visit   OT Home Exercise Plan Education provided:  PWR! moves in supine (basic 4) 12/21/15, PWR! moves quadraped 12/25/15; 01/05/16 PWR! hands (basic 4), coordination HEP, keeping thinking skills sharp/ memory compensations   Consulted and Agree with Plan of Care Patient      Patient will benefit from skilled therapeutic intervention in order to improve the following deficits and impairments:  Decreased coordination, Decreased endurance, Decreased activity tolerance, Impaired tone, Impaired UE functional use, Decreased knowledge of use of DME, Decreased balance, Decreased cognition, Decreased mobilitydecreased coordination, bradykinesia, abnormal posture, rigidity  Visit Diagnosis: Other symptoms and signs involving the nervous system  Other symptoms and signs involving the musculoskeletal system  Abnormal posture    Problem List Patient Active Problem List   Diagnosis Date Noted  . Parkinsonian features 09/15/2015  . ED (erectile dysfunction) 09/15/2015  . MDD (major depressive disorder),  recurrent episode, severe ([place]) 02/10/2014  . Nonspecific abnormal electrocardiogram (ECG) (EKG) 02/07/2014  . Non-compliant behavior 02/03/2014  . Morton's neuroma of left foot 08/07/2013  . 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 Hyperglycemia 01/26/2012  . Hypogonadism male 01/24/2012  . Syncope and collapse 01/20/2012  .  OSA (obstructive sleep apnea) 01/17/2011  . CHEST TIGHTNESS 02/18/2008    RINE,KATHRYN 02/11/2016, 12:43 PM Theone Murdoch, OTR/L [phone] Phone: [phone] 12:43 PM 02/11/2016 [facility] [address] [place], [place], [address] Phone: [phone]   Fax:  [phone]  Name: GAY RAPE MRN: [id number] Date of Birth: Mar 22, 1951

## 2016-02-09 NOTE — Patient Instructions (Signed)

## 2016-02-11 ENCOUNTER — Ambulatory Visit: Payer: Federal, State, Local not specified - PPO | Admitting: Speech Pathology

## 2016-02-11 ENCOUNTER — Ambulatory Visit: Payer: Federal, State, Local not specified - PPO | Admitting: Physical Therapy

## 2016-02-11 ENCOUNTER — Ambulatory Visit: Payer: Federal, State, Local not specified - PPO | Admitting: Occupational Therapy

## 2016-02-11 DIAGNOSIS — R29898 Other symptoms and signs involving the musculoskeletal system: Secondary | ICD-10-CM

## 2016-02-11 DIAGNOSIS — R471 Dysarthria and anarthria: Secondary | ICD-10-CM

## 2016-02-11 DIAGNOSIS — R293 Abnormal posture: Secondary | ICD-10-CM

## 2016-02-11 DIAGNOSIS — R2689 Other abnormalities of gait and mobility: Secondary | ICD-10-CM | POA: Diagnosis not present

## 2016-02-11 DIAGNOSIS — R278 Other lack of coordination: Secondary | ICD-10-CM

## 2016-02-11 DIAGNOSIS — R29818 Other symptoms and signs involving the nervous system: Secondary | ICD-10-CM

## 2016-02-11 NOTE — Therapy (Signed)
[facility] [address] [place], [place], [address] Phone: [phone]   Fax:  [phone]  Speech Language Pathology Treatment  Patient Details  Name: [name] [name] MRN: [id number] Date of Birth: 09/10/1951 Referring Provider: Dr. [name] Tat  Encounter Date: 02/11/2016      End of Session - 02/11/16 0923    Visit Number 16   Number of Visits 17   Date for SLP Re-Evaluation 02/10/16   Authorization - Visit Number 13   SLP Start Time 8242   SLP Stop Time  0926   SLP Time Calculation (min) 39 min      Past Medical History  Diagnosis Date  . Headache(784.0)   . Cardiac murmur     as a child  . Streptococcal meningitis     as an infant  . Depression   .  OSA (obstructive sleep apnea) 01/17/2011    npsg 2012:  AHI 67/hr. Auto titration 2012:  Optimal pressure 12cm.   Marland Kitchen HEADACHES, HX OF 02/18/2008    Qualifier: Diagnosis of  By: Danny Lawless CMA, [place]    . APPENDECTOMY, HX OF 02/18/2008    Qualifier: Diagnosis of  By: Danny Lawless CMA, [place]    . Parkinson disease ([place]) 11/2014    Past Surgical History  Procedure Laterality Date  . Nasal sinus surgery      x 4 as a child  . Vasectomy    . Appendectomy  1967    There were no vitals filed for this visit.      Subjective Assessment - 02/11/16 0856    Subjective "I come back in November"               ADULT SLP TREATMENT - 02/11/16 0858    General Information   Behavior/Cognition Alert;Cooperative;Pleasant mood   Treatment Provided   Treatment provided Cognitive-Linquistic   Cognitive-Linquistic Treatment   Treatment focused on Dysarthria;Voice   Skilled Treatment Recalibrated volume with loud /a/ average of 95dB. 20 minute conversation average 71dB.  Pt instructed to continue loud /a/ 5x 5/7 days a week and be aware of any swallowing difficulties.   Assessment / Recommendations / Plan   Plan Discharge SLP treatment due to (comment);All goals  met   Progression Toward Goals   Progression toward goals Goals met, education completed, patient discharged from [employer] - 02/11/16 0919    Education provided Yes   Education Details Continue loud /a/ and loud volume   Person(s) Educated Patient   Methods Explanation   Comprehension Verbalized understanding     SPEECH THERAPY DISCHARGE SUMMARY  Visits from Start of Care: 16  Current functional level related to goals / functional outcomes: See goals below   Remaining deficits: PD induced hypokinetic dysarthira   Education / Equipment: Compensations for dysarthria, loud /a/, breath support for speech Plan: Patient agrees to discharge.  Patient goals were met. Patient is being discharged due to meeting the stated rehab goals.  ?????          SLP Short Term Goals - 02/11/16 0922    SLP SHORT TERM GOAL #1   Time 3   Period Weeks   Status Achieved   SLP SHORT TERM GOAL #2   Title Pt will average 70dB during structured speech tasks and simple conversation with occasional min A   Period Weeks   Status Achieved   SLP SHORT TERM GOAL #3   Title Pt  will utlize compensations to reduce coughing on his saliva while at rest, reporting coughing on saliva reduced by 25% subjectively.   Status Achieved          SLP Long Term Goals - 2016-02-21 6859    SLP LONG TERM GOAL #1   Title pt to maintain average 70dB in 15 minutes mod complex conversation outside [place] room over two sessions   Time 1   Period Weeks   Status Achieved   SLP LONG TERM GOAL #2   Title Pt will maintain an average of 70dBfor modcomplex conversation over 12 minutes with rare minimal assistance over 2 sessions    Time 1   Period Weeks   Status Achieved          Plan - 21-Feb-2016 0920    Clinical Impression Statement D/C ST - all goals met and education complete - Pt conversing at [facility] during conversation with supervision cues.    Speech Therapy Frequency 2x / week    Treatment/Interventions SLP instruction and feedback;Internal/external aids;Patient/family education;Functional tasks;Compensatory strategies;Aspiration precaution training   Potential to Achieve Goals Good   Potential Considerations Severity of impairments   Consulted and Agree with Plan of Care Patient      Patient will benefit from skilled therapeutic intervention in order to improve the following deficits and impairments:   Dysarthria and anarthria      G-Codes - Feb 21, 2016 0926    Functional Limitations Motor speech   Motor Speech Goal Status [phone]) At least 1 percent but less than 20 percent impaired, limited or restricted   Motor Speech Goal Status ([id number]) At least 1 percent but less than 20 percent impaired, limited or restricted      Problem List Patient Active Problem List   Diagnosis Date Noted  . Parkinsonian features 09/15/2015  . ED (erectile dysfunction) 09/15/2015  . MDD (major depressive disorder), recurrent episode, severe ([place]) Feb 20, 2014  . Nonspecific abnormal electrocardiogram (ECG) (EKG) 02/07/2014  . Non-compliant behavior 02/03/2014  . Morton's neuroma of left foot 08/07/2013  . 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 Hyperglycemia 01/26/2012  . Hypogonadism male 01/24/2012  . Syncope and collapse 01/20/2012  .  OSA (obstructive sleep apnea) 01/17/2011  . CHEST TIGHTNESS 02/18/2008    Kayan Blissett, Annye Rusk MS, CCC-SLP 2016-02-21, 9:27 AM  [facility] [address] [place], [place], [address] Phone: [phone]   Fax:  [phone]   Name: NEZIAH BRALEY MRN: [id number] Date of Birth: 1950/11/09

## 2016-02-11 NOTE — Therapy (Signed)
[facility] [address] [place], [place], [address] Phone: [phone]   Fax:  [phone]  Occupational Therapy Treatment  Patient Details  Name: [name] [name] MRN: [id number] Date of Birth: 05/04/1951 Referring Provider: Dr. Carles Collet  Encounter Date: 02/11/2016      OT End of Session - 02/11/16 1246    Visit Number 16   Number of Visits 17   Date for OT Re-Evaluation 02/13/16   Authorization Type [id number]-75 visit limit   OT Start Time 1018   OT Stop Time 1100   OT Time Calculation (min) 42 min   Activity Tolerance Patient tolerated treatment well   Behavior During Therapy [facility] for tasks assessed/performed      Past Medical History  Diagnosis Date  . Headache(784.0)   . Cardiac murmur     as a child  . Streptococcal meningitis     as an infant  . Depression   .  OSA (obstructive sleep apnea) 01/17/2011    npsg 2012:  AHI 67/hr. Auto titration 2012:  Optimal pressure 12cm.   Marland Kitchen HEADACHES, HX OF 02/18/2008    Qualifier: Diagnosis of  By: Danny Lawless CMA, [place]    . APPENDECTOMY, HX OF 02/18/2008    Qualifier: Diagnosis of  By: Danny Lawless CMA, [place]    . Parkinson disease ([place]) 11/2014    Past Surgical History  Procedure Laterality Date  . Nasal sinus surgery      x 4 as a child  . Vasectomy    . Appendectomy  1967    There were no vitals filed for this visit.      Subjective Assessment - 02/11/16 1234    Pertinent History dx PD 11/2014, hx of depression, mild cognitive impairment per recent neuropsych cognitive testing   Patient Stated Goals Pt reports increased weakness   Currently in Pain? Yes   Pain Score 1    Pain Location Generalized   Pain Descriptors / Indicators --  stiffness   Pain Type Acute pain   Pain Onset In the past 7 days   Pain Frequency Intermittent   Aggravating Factors  unsure   Pain Relieving Factors exercises alleviate   Multiple Pain Sites No          Reviewed PWR! Basic  4 seated,  10 reps each, min v.c Finished checking goals. Pt practiced tying shoes using foot stool for increased ease  following PWR! hands Discussed strategies at work to minimize back pain and stiffness, and ways tot incorporate large amplitude movements and trunk rotation into work activities Pt was provided with a flow sheet of exercises to perform, therapist reviewed importance of continued exercise.                      OT Short Term Goals - 02/09/16 0941    OT SHORT TERM GOAL #1   Title Pt will be independent with updated HEP    Period Weeks   Status Achieved   OT SHORT TERM GOAL #2   Title Pt will demonstrate improved ease with feeding as evidenced by performing PPT#2  in 11 secs or less.   Baseline 14.22   Time 4   Period Weeks   Status Not Met   improved but not met 11.34 secs, 11.12 secs   OT SHORT TERM GOAL #3   Title Pt will verbalize understanding of cognitive compensation strategies/ways to promote cognitive skills prn.   Time 4   Period Weeks  Status Achieved   OT SHORT TERM GOAL #4   Title Pt will demo incr ease with dressing as shown by ability to button/unbutton 3 buttons on table in less than 38sec.   Baseline 43.22sec   Time 4   Period Weeks   Status Achieved  25.78 secs   OT SHORT TERM GOAL #5   Title Pt will demonstrate increased ease with dressing as evidenced by decreasing PPT#4  by 5 secs.   Time 4   Period Weeks   Status Achieved  11.59 secs           OT Long Term Goals - 02/11/16 1039    OT LONG TERM GOAL #1   Title Pt will verbalize understanding of updated AE/strategies for ADLs/IADLs prn    Time 8   Status Achieved   OT LONG TERM GOAL #2   Title Pt will improve dressing ability as shown by improving time on PPT#4 by at least 10 sec.   Baseline 40.72 secs   Time 8   Period Weeks   Status Achieved  12.53 secs   OT LONG TERM GOAL #3   Title Pt will demonstrate ability to write a paragraph with 100% legibility and no  significant decrease in letter size.   Time 8   Period Weeks   Status Achieved   OT LONG TERM GOAL #4   Title Pt will verbalize understanding of ways to prevent future PD-related complications and verbalize understanding of community resources.   Time 8   Period Weeks   Status Achieved   OT LONG TERM GOAL #5   Title -----------------------------------------------------------------------------------------------------------   OT LONG TERM GOAL #6   Title --------------------------------------------------------               Plan - 02/11/16 1239    Clinical Impression Statement Pt is progressing towards goals. He agrees with plans for d/c next visit.   Rehab Potential Good   OT Frequency 2x / week   OT Duration 8 weeks   OT Treatment/Interventions Self-care/ADL training;Moist Heat;DME and/or AE instruction;Splinting;Patient/family education;Balance training;Therapeutic exercises;Therapeutic exercise;Therapeutic activities;Cognitive remediation/compensation;Passive range of motion;Functional Mobility Training;Neuromuscular education;Cryotherapy;Electrical Stimulation;Energy conservation;Manual Therapy   Plan d/c next visit   OT Home Exercise Plan Education provided:  PWR! moves in supine (basic 4) 12/21/15, PWR! moves quadraped 12/25/15; 01/05/16 PWR! hands (basic 4), coordination HEP, keeping thinking skills sharp/ memory compensations   Consulted and Agree with Plan of Care Patient      Patient will benefit from skilled therapeutic intervention in order to improve the following deficits and impairments:  Decreased coordination, Decreased endurance, Decreased activity tolerance, Impaired tone, Impaired UE functional use, Decreased knowledge of use of DME, Decreased balance, Decreased cognition, Decreased mobility  Visit Diagnosis: Other symptoms and signs involving the nervous system  Other symptoms and signs involving the musculoskeletal system  Abnormal posture  Other  abnormalities of gait and mobility  Other lack of coordination   OCCUPATIONAL THERAPY DISCHARGE SUMMARY   Current functional level related to goals / functional outcomes: Pt met all long term goals. Pt demonstrated good overall progress.   Remaining deficits: Bradykinesia, decreased coordination, abnormal posture, rigidity, decreased balance, cognitive deficits   Education / Equipment: Pt was educated regarding: HEP, adapted strategies for ADLS/IADLS, ways to prevent future PD related complications, and community resources.  Pt verbalized understanding of all education.  Plan: Patient agrees to discharge.  Patient goals were partially met. Patient is being discharged due to meeting the stated rehab goals.  ?????  Problem List Patient Active Problem List   Diagnosis Date Noted  . Parkinsonian features 09/15/2015  . ED (erectile dysfunction) 09/15/2015  . MDD (major depressive disorder), recurrent episode, severe ([place]) 02/10/2014  . Nonspecific abnormal electrocardiogram (ECG) (EKG) 02/07/2014  . Non-compliant behavior 02/03/2014  . Morton's neuroma of left foot 08/07/2013  . 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 Hyperglycemia 01/26/2012  . Hypogonadism male 01/24/2012  . Syncope and collapse 01/20/2012  .  OSA (obstructive sleep apnea) 01/17/2011  . CHEST TIGHTNESS 02/18/2008    [name] [name] 02/11/2016, 12:49 PM [name] [name], OTR/L [phone] Phone: [phone] 12:50 PM 02/11/2016 [facility] [address] [place], [place], [address] Phone: [phone]   Fax:  [phone]  Name: [name] [name] MRN: [id number] Date of Birth: 1950-10-09

## 2016-02-11 NOTE — Patient Instructions (Signed)
(  Exercise) Monday Tuesday Wednesday Thursday Friday Saturday Sunday   PWR!seated           PWR! standing           PWR! quadraped (all 4's)           PWR! Prone (on stomach)           PWR! Supine(on back, laying down)           PWR! hands           Coordination activities          walking

## 2016-02-12 DIAGNOSIS — R2689 Other abnormalities of gait and mobility: Secondary | ICD-10-CM | POA: Diagnosis not present

## 2016-02-12 NOTE — Therapy (Signed)
[facility] [address] [place], [place], [address] Phone: [phone]   Fax:  [phone]  Physical Therapy Treatment  Patient Details  Name: [name] [name] MRN: [id number] Date of Birth: 05/22/1951 Referring Provider: Lurena Joiner Tat  Encounter Date: 02/11/2016      PT End of Session - 02/12/16 0956    Visit Number 14   Number of Visits 17   Date for PT Re-Evaluation 02/16/16   Authorization Type BC[employer] FED  75 visit limit combined   PT Start Time 0934   PT Stop Time 1014   PT Time Calculation (min) 40 min   Activity Tolerance Patient tolerated treatment well   Behavior During Therapy [facility] for tasks assessed/performed      Past Medical History  Diagnosis Date  . Headache(784.0)   . Cardiac murmur     as a child  . Streptococcal meningitis     as an infant  . Depression   .  OSA (obstructive sleep apnea) 01/17/2011    npsg 2012:  AHI 67/hr. Auto titration 2012:  Optimal pressure 12cm.   Marland Kitchen HEADACHES, HX OF 02/18/2008    Qualifier: Diagnosis of  By: Genelle Gather CMA, [place]    . APPENDECTOMY, HX OF 02/18/2008    Qualifier: Diagnosis of  By: Genelle Gather CMA, [place]    . Parkinson disease (HCC) 11/2014    Past Surgical History  Procedure Laterality Date  . Nasal sinus surgery      x 4 as a child  . Vasectomy    . Appendectomy  1967    There were no vitals filed for this visit.      Subjective Assessment - 02/11/16 0933    Subjective Feel like my body is getting weaker-things that I normally do seem like they are getting harder.   Patient Stated Goals Pt's goal for therapy is to address the shuffling with walking.   Currently in Pain? Yes   Pain Score 1    Pain Location Generalized  general pain, soreness, stiffness   Pain Descriptors / Indicators --  stiffness   Pain Type Acute pain   Pain Onset In the past 7 days   Pain Frequency Intermittent   Aggravating Factors  unsure   Pain Relieving Factors  exercises alleviate            [facility] PT Assessment - 02/11/16 0948    Functional Gait  Assessment   Gait assessed  Yes   Gait Level Surface Walks 20 ft, slow speed, abnormal gait pattern, evidence for imbalance or deviates 10-15 in outside of the 12 in walkway width. Requires more than 7 sec to ambulate 20 ft.  8.47 sec   Change in Gait Speed Able to change speed, demonstrates mild gait deviations, deviates 6-10 in outside of the 12 in walkway width, or no gait deviations, unable to achieve a major change in velocity, or uses a change in velocity, or uses an assistive device.   Gait with Horizontal Head Turns Performs head turns smoothly with slight change in gait velocity (eg, minor disruption to smooth gait path), deviates 6-10 in outside 12 in walkway width, or uses an assistive device.   Gait with Vertical Head Turns Performs task with slight change in gait velocity (eg, minor disruption to smooth gait path), deviates 6 - 10 in outside 12 in walkway width or uses assistive device   Gait and Pivot Turn Pivot turns safely within 3 sec and stops quickly with no loss  of balance.   Step Over Obstacle Is able to step over one shoe box (4.5 in total height) but must slow down and adjust steps to clear box safely. May require verbal cueing.   Gait with Narrow Base of Support Ambulates 4-7 steps.   Gait with Eyes Closed Walks 20 ft, slow speed, abnormal gait pattern, evidence for imbalance, deviates 10-15 in outside 12 in walkway width. Requires more than 9 sec to ambulate 20 ft.   Ambulating Backwards Walks 20 ft, slow speed, abnormal gait pattern, evidence for imbalance, deviates 10-15 in outside 12 in walkway width.  25.91 sec   Steps Alternating feet, must use rail.   Total Score 16   FGA comment: improved from 15/30.                     [facility] Adult PT Treatment/Exercise - 02/12/16 0001    Transfers   Transfers Sit to Stand;Stand to Sit   Sit to Stand 6: Modified independent  (Device/Increase time);Without upper extremity assist;From chair/3-in-1   Five time sit to stand comments  12.93   Stand to Sit 6: Modified independent (Device/Increase time);Without upper extremity assist;To chair/3-in-1   Number of Reps Other sets (comment)   Comments Sit<>stand x 10 reps from 18" surface, then from 16" surface, no UE support, initial cues for technique and use of momentum   Ambulation/Gait   Ambulation/Gait Yes   Ambulation/Gait Assistance 6: Modified independent (Device/Increase time)   Ambulation/Gait Assistance Details Used walking poles on outdoor surfaces-pt has initial cues for sequencing of walking poles, but he is able to sequence and reset sequence as needed with min cues   Ambulation Distance (Feet) 1000 Feet   Assistive device None  bilateral walking poles   Gait Pattern Step-through pattern   Ambulation Surface Level;Unlevel;Indoor;Paved   Gait velocity 12.64 sec = 2.59 ft/sec         Self Care:       PT Education - 02/12/16 0800    Education provided Yes   Education Details Progress towards goals, plans for discharge this visit; having wife provide cueing strategies for gait and posture; continued community fitness ([employer] Class) and return to PT eval in 6 months   Person(s) Educated Patient   Methods Explanation   Comprehension Verbalized understanding          PT Short Term Goals - 01/12/16 1130    PT SHORT TERM GOAL #1   Title Pt will be independent with HEP for improved bed mobility, transfers, balance and gait.  TARGET 01/15/16   Baseline needs cues for intensity   Time 4   Period Weeks   Status Partially Met   PT SHORT TERM GOAL #2   Title Pt will improve 5x sit<>stand to less than or equal 14 seconds for improved transfer efficiency and safety.   Baseline 17.08 sec 01/11/16   Time 4   Period Weeks   Status Not Met   PT SHORT TERM GOAL #3   Title Pt will improve single limb stance to at least 4 seconds bilateral lower extremities  for improved stair and obstacle negotiation.   Baseline 8 seconds RLE, 1 second LLE   Time 4   Period Weeks   Status Partially Met   PT SHORT TERM GOAL #4   Title 3 minute walk test to be completed, with pt able to ambulate at least 50 ft greater than baseline, for improved endurance for long distance gait for work.  Baseline 581 ft at best 01/11/16   Time 4   Period Weeks   Status Not Met   PT SHORT TERM GOAL #5   Title Pt will verbalize at least 25% improvement in bed mobility.   Baseline pt reports still having difficulty and thinks it is due to his mattress   Time 4   Period Weeks   Status Not Met           PT Long Term Goals - 02/11/16 0941    PT LONG TERM GOAL #1   Title Pt will verbalize understanding of fall prevention/tips to reduce freezing with gait.  TARGET 02/15/16   Time 8   Period Weeks   Status Achieved   PT LONG TERM GOAL #2   Title Pt will perform at least 8 of 10 reps of sit<>stand from 18 inch surfaces or lower, with minimal to no UE support for improved transfer efficiency and safety.   Time 8   Period Weeks   Status Achieved   PT LONG TERM GOAL #3   Title Pt will improve Functional Gait Assessment to at least 19/30 for decreased fall risk.   Time 8   Period Weeks   Status Not Met   PT LONG TERM GOAL #4   Title Pt will negotiate at least 4 steps with one handrail, step through pattern, modified independently without loss of balance.   Time 8   Period Weeks   Status Partially Met   PT LONG TERM GOAL #5   Title Pt will verbalize understanding of plans for community fitness upon D/C from PT.   Time 8   Period Weeks   Status Achieved               Plan - 2016-02-17 0957    Clinical Impression Statement Pt has met LTG #1, 2, 5.  LTG # 3 not met for Functional Gait Assessment.  LTG #4 partially met for stair negotiation.  Discussed with stair negotiation to use step to pattern when carrying objects in hands.  Pt seems overall more hesitant with  gait activities today.  Discussed importance of continuing exercise at recommended frequency and intensity as well as using wife as resource for cueing during gait and functional tasks.  Pt is appropriate for discharge at this time.   Rehab Potential Good   PT Frequency 2x / week   PT Duration 8 weeks  plus eval   PT Treatment/Interventions ADLs/Self Care Home Management;Therapeutic exercise;Therapeutic activities;Functional mobility training;Gait training;Balance training;Neuromuscular re-education   PT Next Visit Plan Discharge this visit.  Recommend return PT eval in 6 months.   Consulted and Agree with Plan of Care Patient      Patient will benefit from skilled therapeutic intervention in order to improve the following deficits and impairments:  Abnormal gait, Decreased balance, Decreased mobility, Decreased strength, Difficulty walking, Impaired flexibility, Postural dysfunction  Visit Diagnosis: Other abnormalities of gait and mobility       G-Codes - 02-17-2016 1000    Functional Assessment Tool Used 5x sit<>stand 12.93 sec, FGA 16/30, gait velocity 2.59 ft/sec   Functional Limitation Mobility: Walking and moving around   Mobility: Walking and Moving Around Goal Status [phone]) At least 20 percent but less than 40 percent impaired, limited or restricted   Mobility: Walking and Moving Around Discharge Status [phone]) At least 20 percent but less than 40 percent impaired, limited or restricted      Problem List Patient Active Problem List  Diagnosis Date Noted  . Parkinsonian features 09/15/2015  . ED (erectile dysfunction) 09/15/2015  . MDD (major depressive disorder), recurrent episode, severe ([place]) 02/10/2014  . Nonspecific abnormal electrocardiogram (ECG) (EKG) 02/07/2014  . Non-compliant behavior 02/03/2014  . Morton's neuroma of left foot 08/07/2013  . 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 Hyperglycemia 01/26/2012  . Hypogonadism male  01/24/2012  . Syncope and collapse 01/20/2012  .  OSA (obstructive sleep apnea) 01/17/2011  . CHEST TIGHTNESS 02/18/2008    Zariya Minner W. 02/12/2016, 10:01 AM Frazier Butt., PT [facility] [address] [place], [place], [address] Phone: [phone]   Fax:  [phone]  Name: [name] [name] MRN: [id number] Date of Birth: June 12, 1951  PHYSICAL THERAPY DISCHARGE SUMMARY  Visits from Start of Care: 14  Current functional level related to goals / functional outcomes:     PT Long Term Goals - 02/11/16 0941    PT LONG TERM GOAL #1   Title Pt will verbalize understanding of fall prevention/tips to reduce freezing with gait.  TARGET 02/15/16   Time 8   Period Weeks   Status Achieved   PT LONG TERM GOAL #2   Title Pt will perform at least 8 of 10 reps of sit<>stand from 18 inch surfaces or lower, with minimal to no UE support for improved transfer efficiency and safety.   Time 8   Period Weeks   Status Achieved   PT LONG TERM GOAL #3   Title Pt will improve Functional Gait Assessment to at least 19/30 for decreased fall risk.   Time 8   Period Weeks   Status Not Met   PT LONG TERM GOAL #4   Title Pt will negotiate at least 4 steps with one handrail, step through pattern, modified independently without loss of balance.   Time 8   Period Weeks   Status Partially Met   PT LONG TERM GOAL #5   Title Pt will verbalize understanding of plans for community fitness upon D/C from PT.   Time 8   Period Weeks   Status Achieved    Pt has made progress with therapy, but typically requires cueing for improved amplitude and intensity of movement.   Remaining deficits: Bradykinesia, posture, balance   Education / Equipment: Pt has been educated in HEP, fall prevention and tips to reduce freezing episodes with gait, community fitness options.  Plan: Patient agrees to discharge.  Patient goals were partially met. Patient  is being discharged due to being pleased with the current functional level.  ?????    Mady Haagensen, PT 02/12/2016 10:04 AM Phone: [phone] Fax: [phone]

## 2016-03-08 ENCOUNTER — Encounter (HOSPITAL_COMMUNITY): Payer: Self-pay | Admitting: Psychiatry

## 2016-03-08 ENCOUNTER — Ambulatory Visit (INDEPENDENT_AMBULATORY_CARE_PROVIDER_SITE_OTHER): Payer: Federal, State, Local not specified - PPO | Admitting: Psychiatry

## 2016-03-08 VITALS — BP 120/82 | HR 68 | Ht 74.0 in | Wt 288.2 lb

## 2016-03-08 DIAGNOSIS — F332 Major depressive disorder, recurrent severe without psychotic features: Secondary | ICD-10-CM

## 2016-03-08 MED ORDER — CITALOPRAM HYDROBROMIDE 40 MG PO TABS: 40.0000 mg | ORAL_TABLET | Freq: Every day | ORAL | Status: DC

## 2016-03-08 MED ORDER — BUPROPION HCL ER (XL) 300 MG PO TB24
300.0000 mg | ORAL_TABLET | Freq: Every morning | ORAL | Status: DC
Start: 2016-03-08 — End: 2016-07-03

## 2016-03-08 NOTE — Progress Notes (Signed)
[facility] Progress Note  [name] [name] [id number] 65 y.o.  03/08/2016 9:57 AM  Chief Complaint:  Medication management and follow-up.              History of Present Illness:  [name] [name] came for his follow-up appointment.  He is taking his medication as prescribed and reported no side effects.  He admitted lately feeling forgetful and difficulty in concentration.  He does not remember very well and his wife gets frustrated because she believe he is not listening to her.  However he sleeping good.  He is working 10 hours a week at [employer].  He had a plan to go department next month and he is excited about it.  Sometime he has mild tremors and shakes but overall he is taking his medication as prescribed.  His sleep is good.  He continues to drive by himself but admitted some time forgetful.  Patient is scheduled to see his neurologist in 2 weeks.  He denies any paranoia, hallucination, anger issues or any feeling of hopelessness or worthlessness.  He likes Celexa and Wellbutrin which is working very well for him.  His appetite is okay.  His vitals are stable.  Patient denies drinking alcohol or using any illegal substances.   Suicidal Ideation: No Plan Formed: No Patient has means to carry out plan: No  Homicidal Ideation: No Plan Formed: No Patient has means to carry out plan: No  Review of Systems  Constitutional: Negative for weight loss.  HENT: Negative.   Cardiovascular: Negative for chest pain and palpitations.  Musculoskeletal: Negative for myalgias and back pain.  Skin: Negative for itching and rash.  Neurological: Positive for tremors. Negative for dizziness and headaches.  Psychiatric/Behavioral: Negative for depression, suicidal ideas, hallucinations and substance abuse. The patient does not have insomnia.    Psychiatric: Agitation: No Hallucination: No Depressed Mood: No Insomnia: No Hypersomnia: No Altered Concentration: No Feels Worthless:  No Grandiose Ideas: No Belief In Special Powers: No New/Increased Substance Abuse: No Compulsions: No  Neurologic: Headache: No Seizure: No Paresthesias: No  Past Medical History: Hypogonadism, Parkinson  Social history. Patient is employed and married.  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  Outpatient Encounter Prescriptions as of 03/08/2016  Medication Sig  . B Complex-C (B-COMPLEX WITH VITAMIN C) tablet Take 1 tablet by mouth daily.  Marland Kitchen buPROPion (WELLBUTRIN XL) 300 MG 24 hr tablet Take 1 tablet (300 mg total) by mouth every morning.  . citalopram (CELEXA) 40 MG tablet Take 1 tablet (40 mg total) by mouth daily.  . Omega 3-6-9 Fatty Acids (OMEGA 3-6-9 PO) Take by mouth.  Marland Kitchen rOPINIRole (REQUIP) 2 MG tablet Take 2 tablets in the AM, 1 in the afternoon and 1 in the evening  . sildenafil (VIAGRA) 50 MG tablet Take 1 tablet (50 mg total) by mouth daily as needed for erectile dysfunction.  . TURMERIC CURCUMIN PO Take by mouth.  . [DISCONTINUED] buPROPion (WELLBUTRIN XL) 300 MG 24 hr tablet Take 1 tablet (300 mg total) by mouth every morning.  . [DISCONTINUED] citalopram (CELEXA) 40 MG tablet Take 1 tablet (40 mg total) by mouth daily.   No facility-administered encounter medications on file as of 03/08/2016.    Past Psychiatric History/Hospitalization(s): Patient denies any history of suicidal attempt or any inpatient psychiatric treatment.  He denies any history of mania, psychosis, paranoia or any hallucination.  In the past he had tried Effexor, Abilify, Paxil, Cymbalta, Zoloft, amitriptyline,  Pristiq, Britnellex, Celexa, Deplin and Vyvanse.  He is seen in this office since September 2009.  He has seen multiple psychiatrists in the past.  Anxiety: No Bipolar Disorder: No Depression: Yes Mania: No Psychosis: No Schizophrenia: No Personality Disorder: No Hospitalization for psychiatric illness: No History of Electroconvulsive Shock Therapy: No Prior Suicide  Attempts: No  Physical Exam: Constitutional:  BP 120/82 mmHg  Pulse 68  Ht 6\' 2"  (1.88 m)  Wt 288 lb 3.2 oz (130.727 kg)  BMI 36.99 kg/m2  No results found for this or any previous visit (from the past 2160 hour(s)).  General Appearance: alert, oriented, no acute distress  Musculoskeletal: Strength & Muscle Tone: decreased Gait & Station: normal Patient leans: N/A   Mental Status Examination: Patient is casually dressed and fairly groomed.  He maintained fair eye contact.  His speech is slow with decreased volume and tone.  His response to the question is slow.  His thought process is slow but coherent.  His affect is neutral.  He denies any auditory or visual hallucination.  He denies any active or passive suicidal thoughts and homicidal thoughts.  There were no delusions or any paranoia.  His attention and concentration is fair. He has some difficulty remembering things but he is alert and oriented 3.  His psychomotor activity is decreased.  His fund of knowledge is average.  His insight judgment and impulse control is okay.  Established Problem, Stable/Improving (1), Review of Last Therapy Session (1) and Review of Medication Regimen & Side Effects (2)  Assessment: Axis I: Maj. depressive disorder, recurrent, severe.  Mild cognitive impairment.   Axis II: Deferred  Axis III: Hypogonadism, Parkinson   Plan:  Patient is stable on Celexa 40 mg daily and Wellbutrin XL 300 mg daily.  He has no side effects.  He is scheduled to see his neurologist in 2 weeks .  I recommended to discuss with his neurologist about his memory issues .  Patient understand he has Parkinson and sometime Parkinson can cause memory problem.  He does not have any more dizziness. Recommended to call us back if he has any question or any concern. Will see him again in 3 months.  Discuss safety plan that anytime having active suicidal thoughts or homicidal thought any need to call 911 or go to the local emergency  room.   ARFEEN,SYED T., MD 03/08/2016

## 2016-03-14 ENCOUNTER — Encounter: Payer: Self-pay | Admitting: Internal Medicine

## 2016-03-14 ENCOUNTER — Other Ambulatory Visit (INDEPENDENT_AMBULATORY_CARE_PROVIDER_SITE_OTHER): Payer: Federal, State, Local not specified - PPO

## 2016-03-14 ENCOUNTER — Telehealth: Payer: Self-pay | Admitting: Internal Medicine

## 2016-03-14 ENCOUNTER — Ambulatory Visit (INDEPENDENT_AMBULATORY_CARE_PROVIDER_SITE_OTHER): Payer: Federal, State, Local not specified - PPO | Admitting: Internal Medicine

## 2016-03-14 VITALS — BP 140/90 | HR 67 | Temp 97.7°F | Ht 74.0 in | Wt 290.0 lb

## 2016-03-14 DIAGNOSIS — R5383 Other fatigue: Secondary | ICD-10-CM

## 2016-03-14 DIAGNOSIS — N529 Male erectile dysfunction, unspecified: Secondary | ICD-10-CM | POA: Diagnosis not present

## 2016-03-14 DIAGNOSIS — F332 Major depressive disorder, recurrent severe without psychotic features: Secondary | ICD-10-CM

## 2016-03-14 DIAGNOSIS — R5382 Chronic fatigue, unspecified: Secondary | ICD-10-CM | POA: Diagnosis not present

## 2016-03-14 DIAGNOSIS — R259 Unspecified abnormal involuntary movements: Secondary | ICD-10-CM

## 2016-03-14 DIAGNOSIS — R739 Hyperglycemia, unspecified: Secondary | ICD-10-CM

## 2016-03-14 LAB — COMPREHENSIVE METABOLIC PANEL
ALBUMIN: 4.3 g/dL (ref 3.5–5.2)
ALK PHOS: 64 U/L (ref 39–117)
ALT: 16 U/L (ref 0–53)
AST: 16 U/L (ref 0–37)
BILIRUBIN TOTAL: 0.6 mg/dL (ref 0.2–1.2)
BUN: 19 mg/dL (ref 6–23)
CALCIUM: 9.4 mg/dL (ref 8.4–10.5)
CHLORIDE: 105 meq/L (ref 96–112)
CO2: 27 mEq/L (ref 19–32)
CREATININE: 1.2 mg/dL (ref 0.40–1.50)
GFR: 65.07 mL/min (ref >60.00)
Glucose, Bld: 104 mg/dL — ABNORMAL HIGH (ref 70–99)
Potassium: 4.2 mEq/L (ref 3.5–5.1)
SODIUM: 138 meq/L (ref 135–145)
TOTAL PROTEIN: 7.6 g/dL (ref 6.0–8.3)

## 2016-03-14 LAB — T4, FREE: FREE T4: 0.81 ng/dL (ref 0.60–1.60)

## 2016-03-14 LAB — CBC
HEMATOCRIT: 43.1 % (ref 39.0–52.0)
Hemoglobin: 14.5 g/dL (ref 13.0–17.0)
MCHC: 33.6 g/dL (ref 30.0–36.0)
MCV: 88.8 fl (ref 78.0–100.0)
Platelets: 282 10*3/uL (ref 150.0–400.0)
RBC: 4.86 Mil/uL (ref 4.22–5.81)
RDW: 13.3 % (ref 11.5–15.5)
WBC: 7.2 10*3/uL (ref 4.0–10.5)

## 2016-03-14 LAB — HEMOGLOBIN A1C: HEMOGLOBIN A1C: 6 % (ref 4.6–6.5)

## 2016-03-14 LAB — LIPID PANEL
CHOLESTEROL: 206 mg/dL — AB (ref 0–200)
HDL: 53.9 mg/dL (ref >39.00)
LDL CALC: 127 mg/dL — AB (ref 0–99)
NONHDL: 151.61
Total CHOL/HDL Ratio: 4
Triglycerides: 121 mg/dL (ref 0.0–149.0)
VLDL: 24.2 mg/dL (ref 0.0–40.0)

## 2016-03-14 LAB — TSH: TSH: 1.16 u[IU]/mL (ref 0.35–4.50)

## 2016-03-14 LAB — VITAMIN B12: Vitamin B-12: 494 pg/mL (ref 211–911)

## 2016-03-14 MED ORDER — KETOCONAZOLE 2 % EX CREA
1.0000 | TOPICAL_CREAM | Freq: Two times a day (BID) | CUTANEOUS | Status: DC
Start: 2016-03-14 — End: 2016-09-13

## 2016-03-14 NOTE — Progress Notes (Signed)
Pre visit review using our clinic review tool, if applicable. No additional management support is needed unless otherwise documented below in the visit note. 

## 2016-03-14 NOTE — Patient Instructions (Signed)
We are checking the labs today and will send the results on mychart.   We have sent in the cream for the face that you will use twice a day for 2 weeks or so which should help.

## 2016-03-14 NOTE — Telephone Encounter (Signed)
Patient's wife called to advise that they spoke to you regarding a referral to audiology. i did not see anything entered into the dictation as it stands.

## 2016-03-14 NOTE — Telephone Encounter (Signed)
The visit is not closed yet and yes we did discuss that and it will be done.

## 2016-03-15 ENCOUNTER — Encounter: Payer: Self-pay | Admitting: Internal Medicine

## 2016-03-15 DIAGNOSIS — R5383 Other fatigue: Secondary | ICD-10-CM | POA: Insufficient documentation

## 2016-03-15 NOTE — Assessment & Plan Note (Signed)
He is still following with neurology and has visit soon to discuss his concerns with them. Still using his requip which he feels is helping and PT/OT which was helping while he is doing. Not keeping up with it at home like he should.

## 2016-03-15 NOTE — Assessment & Plan Note (Signed)
Recheck for diabetes with HgA1c.

## 2016-03-15 NOTE — Assessment & Plan Note (Signed)
Suspect that this is not well controlled as they think. He is not getting the crying and waves of emotion which is a positive feature but I worry that some of his total lack of motivation and memory problems and fatigue are coming from uncontrolled depression.

## 2016-03-15 NOTE — Assessment & Plan Note (Signed)
Checking thyroid, B12, Hga1c, CBC, CMP today. It is a very difficult issue as I feel this could be coming some from untreated depression which he and his wife do not agree with and his psychiatrist does not agree. It is also possible that it could be related to his neurological disorder and this is a very frustrating issue for the patient and his wife. There are not any easy solutions although I think working with PT is helpful for him to try to keep him awake during the day. Address labs as needed if abnormal.

## 2016-03-15 NOTE — Progress Notes (Signed)
   Subjective:    Patient ID: [name] [name], male    DOB: September 30, 1951, 65 y.o.   MRN: [id number]  HPI The patient is a 65 YO man coming in for follow up on his medical conditions including his ED (was not able to afford viagra, not enough energy to complete, never tried viagra), and his parkinson's (he is seeing neurology still, his wife feels that his condition is worsening with tremors and stiffness, he is taking requip which does help his symptoms, he is seeing PT but not a lot of motivation to do his exercises at home, his speech is softer as well, energy is very poor), his depression (seeing psych and most recent note indicates stability, he does not admit to depression symptoms but lacks motivation and energy and sleeping a lot of the time), and he is also having new problem of rash on his face (looks like dry skin and lots of flaking with shaving, along his beard line and eye brows, has not tried anything on it). Working only 10 hours per week and intends to retire end of July. His wife is present and helps to provide history.   Review of Systems  Constitutional: Positive for activity change and fatigue. Negative for fever, chills and appetite change.  HENT: Negative for ear pain, facial swelling, sinus pressure, sneezing and sore throat.   Eyes: Negative.   Respiratory: Negative for cough, chest tightness, shortness of breath and wheezing.   Cardiovascular: Negative for chest pain, palpitations and leg swelling.  Gastrointestinal: Negative for abdominal pain, diarrhea, constipation and abdominal distention.  Musculoskeletal: Positive for gait problem.  Skin: Positive for rash.  Neurological: Positive for weakness. Negative for dizziness, light-headedness, numbness and headaches.  Psychiatric/Behavioral: Positive for sleep disturbance and decreased concentration. Negative for dysphoric mood.      Objective:   Physical Exam  Constitutional: He appears well-developed and well-nourished.  No distress.  HENT:  Head: Normocephalic and atraumatic.  Eyes: EOM are normal.  Neck: Normal range of motion.  Cardiovascular: Normal rate and regular rhythm.   Pulmonary/Chest: Effort normal and breath sounds normal. No respiratory distress. He has no wheezes.  Abdominal: Soft. Bowel sounds are normal. He exhibits no distension. There is no tenderness. There is no rebound.  Musculoskeletal: He exhibits no edema.  Skin: Skin is warm and dry.  Psychiatric:  Some mixing up of dates and words. Casually groomed   Filed Vitals:   03/14/16 0842  BP: 140/90  Pulse: 67  Temp: 97.7 F (36.5 C)  TempSrc: Oral  Height: 6\' 2"  (1.88 m)  Weight: 290 lb (131.543 kg)  SpO2: 96%      Assessment & Plan:

## 2016-03-15 NOTE — Assessment & Plan Note (Signed)
There were requesting samples which we do not keep, also checked for coupons which we do not have. I suspect that his underlying fatigue is the main driver. He has had mild low testosterone in the past which I do not think is contributing (I suspect that comes from his weight). Offered urology to talk about other options but they are not interested today.

## 2016-03-29 ENCOUNTER — Ambulatory Visit (INDEPENDENT_AMBULATORY_CARE_PROVIDER_SITE_OTHER): Payer: Federal, State, Local not specified - PPO | Admitting: Neurology

## 2016-03-29 ENCOUNTER — Telehealth: Payer: Self-pay | Admitting: Neurology

## 2016-03-29 ENCOUNTER — Encounter: Payer: Self-pay | Admitting: Neurology

## 2016-03-29 VITALS — BP 112/66 | HR 75 | Ht 74.0 in | Wt 287.0 lb

## 2016-03-29 DIAGNOSIS — G4733 Obstructive sleep apnea (adult) (pediatric): Secondary | ICD-10-CM | POA: Diagnosis not present

## 2016-03-29 DIAGNOSIS — G20A1 Parkinson's disease without dyskinesia, without mention of fluctuations: Secondary | ICD-10-CM

## 2016-03-29 DIAGNOSIS — F330 Major depressive disorder, recurrent, mild: Secondary | ICD-10-CM | POA: Diagnosis not present

## 2016-03-29 DIAGNOSIS — R32 Unspecified urinary incontinence: Secondary | ICD-10-CM | POA: Diagnosis not present

## 2016-03-29 DIAGNOSIS — G20 Parkinson's disease: Secondary | ICD-10-CM | POA: Diagnosis not present

## 2016-03-29 NOTE — Progress Notes (Signed)
[name] [name] was seen today in the movement disorders clinic for neurologic consultation at the request of [name], MD.  The consultation is for the evaluation of slowness, flat affect and to r/o a neurologic disorder.  This patient is accompanied in the office by his spouse who supplements the history.  The first symptom(s) the patient noticed was a feeling of weakness that has been going on for about a year.  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  For about a year and a half he has noted a "body" tremor and his wife has noted a hand tremor in both hands (at rest).  He is stiff like he is "a 65 year old man."  Once he is in bed he finds it is hard to move.  The patient has a hx of significant depression and is on multiple medications related to this.  He was started on cogentin just yesterday, but is also on celexa, wellbutrin and latuda.  He has been on [place] for 1.5 months.  Prior to that he was on abilify (tried on 2 separate times but most recently was only on it for a day) and geodon(doesn't remember this but looks like it may have been given in the past).  He has been on risperdal but cannot remember how long ago.  ECT was offered but his wife does not want him to proceed with that  11/20/14 update:  The patient returns today for follow-up.  He is accompanied by his wife who supplements the history.  He has been off of [place] for about 2 weeks.  Unfortunately, he was unable to afford the dat scan.  He is on just Wellbutrin and Celexa for depression and his Cogentin and has been discontinued.  He has noted a decreased appetite and dry mouth and having trouble sleeping because of stiffness.  Pt states that he is almost afraid to go to sleep.  Missing 2 days of work per week (works nights) but a lot of that because of anxiety.  His wife notices increased tremor.  He has slow at work.  Admits to some  lightheadedness but no syncope.  Note diplopia.  No hallucinations.  His wife is doing most of the driving because he is having difficulty staying in his own lane.  02/19/15 update:  The patient is following up today regarding his parkinsonism.  I reviewed records from his psychiatrist since last visit.  His psychiatry note from 12/04/14 indicates that the patient stated that tremor was better.  However, I received a call 4 days later stating that tremor was worse and he thought it was from the Mirapex.  He wanted to change the Mirapex to something else.  He was on Mirapex 0.5 mg 3 times per day at the time.  We ended up switching it to Requip 1 mg 3 times a day.  Fortunately he is doing better.  He denies any side effects with the Requip.  He currently takes it at 8 AM/2 PM/8 PM.  He does state that he notices that he "shuffles with the left leg."  He has not fallen but sometimes feels off balance.  He states that he finished his physical/occupational/speech therapy and feels that it went well.  He is not exercising on his own.  He initially states that he does not feel that he has time, but also states that he does not  go into work until 2 PM and works until 10:30 PM.  This is a change for him.  He was able to change his job at [employer] and seems like that is doing better.  His wife mentions that he continues to have memory loss that she thinks has increased, but she also states that she has noticed memory loss ever since starting on antidepressants.  He had one fall since last visit.  This was when he was taking the trash to the street.  He had no fractures with this, just a superficial scrape.    05/25/15 update:  The patient presents today, accompanied by his wife who supplements the history.  His Requip was increased last visit so that was taking 2 tablets in the morning, one in afternoon and 2 in the evening and 1 day after I started this, I received a call from his wife that the patient did not  tolerate it and it made symptoms worse (which is what they reported with Mirapex).  While that was very odd to me, I told them to go back down to 1 mg 3 times per day.  He actually comes back on 1 mg, 1 in the AM, 2 in the afternoon and 1 in the evening.  He reports that he is doing worse.  He has more tremor and he feels tired in the evening.  Pts wife asks about trying to go back up on the requip slowly.   He had neuropsych testing in June at [facility].  I reviewed that.  There was no evidence of dementia.  There was evidence of mild cognitive impairment from Parkinson's disease as well as from chronic major depressive disorder.  It is recommended that the patient consider transcranial magnetic stimulation for depression, and he told the neuropsychologist that he considered in the past and opted against it.  She encouraged him to attend a Parkinson's support group and he told her that he tried that once and it was not helpful.  He still c/o "my memory has been terrible and especially over the last few weeks."  He feels that he is not depressed - "that is well controlled."  He is not doing CV exercise.  He is still in his new job but it seems more physical than his old job and he thinks he is not as fast in getting tasks done.  He asks about DBS.  08/25/15 update:  The patient is following up today, accompanied by his wife who supplements the history.  I have reviewed records since our last visit.  He is on Requip XL, which was increased to 6 mg at night last visit.  He changed that back to 2 mg tid of the regular ropinirole because of cost.   He is enrolled in physical therapy.  "I'm doing bad."  When asked to describe how he feels bad, he has trouble describing it.  He had a root canal and hasn't felt well since.  He hasn't been back to work for 4 weeks.  He feels that his speech is low and states that he went to voice therapy 2 weeks ago.  He isn't practicing the voice therapy faithfully.  He is  riding the bike on the highest resistance 20 mins a day.  He is having word finding trouble.   No falls but feels that he is "weaker."  He asks me about going to "light duty" at work and possibly retirement in the near future.  He  continues to see psychiatry for his depression.  He last saw psychiatry on 06/04/2015.  No changes were made in medication as he stated that mood was good and continues to reaffirm that.    11/30/15 update:  The patient follows up today, accompanied by his wife who supplements the history.  Last visit, his ropinirole was increased to 4 mg in the morning (8am but goes back to bed for another 1 hour or so), another 2 mg in the afternoon (1-2 pm) and evening (8-8:30pm).  I changed him to light duty at work last visit at his request.  He called me recently and wanted changed from being able to work 4 hours per day to a total of only 10 hours per week.  I told him that he would need a functional capacity evaluation for this, as I was not sure that this made sense from a Parkinson standpoint, given his stage of Parkinson's disease.  He was fairly frustrated with this recommendation, but I told him that I needed some objective evidence.  He reports that he is awaiting an appt for the FCE (has called).   He has been seeing psychiatry and I  actually received a call from his psychiatrist back in December that his psychiatrist felt that he needed to be on levodopa.  I did not think that was the case, but I was willing to do a levodopa challenge to see if that was the case.  He was scheduled to have that done, but the patient canceled that.  Does report that he thinks that depression under good control.   He denies falls.  Some lightheadedness when first gets up, but no near syncope.  Using bike daily for 15-20 minutes.  No hallucinations; rare visual distortions.  Wife thinks that he seems more stiff and has involuntary "tics and shaking" at night.  Waking up with AM headache.  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  03/29/16 update:  The patient follows up today, accompanied by his wife who supplements the history.  Last visit, his ropinirole was increased to 4 mg in the morning, 2 mg in the afternoon and 2 mg in the evening.  Has intermittent spells of increased stiffness, slower on stairs.  Riding recumbant bike at home.  He has been seeing psychiatry and is still on celexa and wellbutrin.  I reviewed last records with Dr. Adele Schilder and pt continued to c/o memory issues (long standing c/o) and was told to discuss with me as may be PD related.  Pt had neuropsych testing done about a year ago and demonstrated no neurodegenerative memory change related to parkinsons but did feel that depression was an issue affecting cognition.  Thinks that memory is worse. No falls since last visit.  He has attended PT/OT/ST from March-May.  He c/o drooling.  Also c/o talking at night and some tremor at night.  Happens most night.  Also c/o snoring and wife thinks that is due to weight and trying to cut back on sugars.  Has OSAS but doesn't use the CPAP.  Has some visual distortions but no hallucinations.  Can be frightening.  Having some urinary incontinence.  Saw urology in the past but not for incontinence.    Neuroimaging has previously been performed.  It is available for my review today and I reviewed it with him.  There is no BG disease.  ALLERGIES:  No Known Allergies  CURRENT MEDICATIONS:  Outpatient Encounter  Prescriptions as of 03/29/2016  Medication Sig  . B Complex-C (B-COMPLEX WITH VITAMIN C) tablet Take 1 tablet by mouth daily.  Marland Kitchen buPROPion (WELLBUTRIN XL) 300 MG 24 hr tablet Take 1 tablet (300 mg total) by mouth every morning.  . citalopram (CELEXA) 40 MG tablet Take 1 tablet (40 mg total) by mouth daily.  Marland Kitchen ketoconazole (NIZORAL) 2 % cream Apply 1 application topically 2 (two) times daily.  . Omega 3-6-9 Fatty Acids (OMEGA 3-6-9 PO) Take by mouth.  Marland Kitchen rOPINIRole  (REQUIP) 2 MG tablet Take 2 tablets in the AM, 1 in the afternoon and 1 in the evening  . sildenafil (VIAGRA) 50 MG tablet Take 1 tablet (50 mg total) by mouth daily as needed for erectile dysfunction. (Patient not taking: Reported on 03/14/2016)  . TURMERIC CURCUMIN PO Take by mouth.   No facility-administered encounter medications on file as of 03/29/2016.    PAST MEDICAL HISTORY:   Past Medical History  Diagnosis Date  . Headache(784.0)   . Cardiac murmur     as a child  . Streptococcal meningitis     as an infant  . Depression   .  OSA (obstructive sleep apnea) 01/17/2011    npsg 2012:  AHI 67/hr. Auto titration 2012:  Optimal pressure 12cm.   Marland Kitchen HEADACHES, HX OF 02/18/2008    Qualifier: Diagnosis of  By: Danny Lawless CMA, [place]    . APPENDECTOMY, HX OF 02/18/2008    Qualifier: Diagnosis of  By: Danny Lawless CMA, [place]    . Parkinson disease ([place]) 11/2014    PAST SURGICAL HISTORY:   Past Surgical History  Procedure Laterality Date  . Nasal sinus surgery      x 4 as a child  . Vasectomy    . Appendectomy  1967    SOCIAL HISTORY:   Social History   Social History  . Marital Status: Married    Spouse Name: N/A  . Number of Children: Y  . Years of Education: N/A   Occupational History  . 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 Smoking status: Never Smoker   . Smokeless tobacco: Never Used  . Alcohol Use: No  . Drug Use: No  . Sexual Activity: Yes    Birth Control/ Protection: None   Other Topics Concern  . Not on file   Social History Narrative    FAMILY HISTORY:   Family Status  Relation Status Death Age  . Father Deceased     lung cancer, alzheimer's  . Mother Alive     HTN, A fib  . Brother Deceased     accident  . Sister Alive     healthy  . Son Alive     healthy  . Daughter Alive     healthy  . Daughter Alive     healthy  . Maternal Aunt Alive     Parkinson's Disease    ROS:  A complete 10 system review of systems  was obtained and was unremarkable apart from what is mentioned above.  PHYSICAL EXAMINATION:    VITALS:   There were no vitals filed for this visit. Wt Readings from Last 3 Encounters:  03/14/16 290 lb (131.543 kg)  03/08/16 288 lb 3.2 oz (130.727 kg)  12/07/15 279 lb 3.2 oz (126.644 kg)     GEN:  The patient appears stated age and is in NAD. HEENT:  Normocephalic, atraumatic.  The mucous membranes are moist. The  superficial temporal arteries are without ropiness or tenderness. CV:  RRR Lungs:  CTAB Neck/HEME:  There are no carotid bruits bilaterally.  Neurological examination:  Orientation: The patient is alert and oriented x3. Fund of knowledge is appropriate.  Recent and remote memory are intact.  Attention and concentration are normal.    Able to name objects and repeat phrases. Cranial nerves: There is good facial symmetry. 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  Pupils are equal round and reactive to light bilaterally.  The speech is fluent and clear.  He is hypophonic.  The patient is able to make the gutteral sounds without difficulty. Soft palate rises symmetrically and there is no tongue deviation. Hearing is intact to conversational tone. Sensation: Sensation is intact to light touch throughout Motor: Strength is 5/5 in the bilateral upper and lower extremities.   Shoulder shrug is equal and symmetric.  There is no pronator drift.   Movement examination: Tone: There is normal tone bilaterally Abnormal movements: none today Coordination:  There is decremation with RAM's, seen with hand opening and closing on the left, finger taps on the left, alternation of supination/pronation on the left, heel taps and toe taps on the left. Gait and Station: The patient has no difficulty arising out of a deep-seated chair without the use of the hands.   The patient's stride length is decreased with decreased arm swing on the left and slight hand tremor on the L.    His pull test was  negative today.  ASSESSMENT/PLAN:  1.  Parkinsonism  -While he certainly could have idiopathic PD, especially since he was only on the [place] for 6-8 weeks and sx's preceded that, he was exposed to multiple other antipsychotics and I am unsure of the timeline of those exposures.  He has been off of [place] since early February, and his examination had improved when he got off of Latuda, even before starting on Requip.  Asked him to not spread requip dosages so far apart.    -Move dosages of requip closer together  -info on [employer] and given information ACT gym.  He lives closer to the ACT gym 2  Depression  -The patient reports that this has been stable.  He is not suicidal or homicidal.  He will remain on Celexa and Wellbutrin and remain under the care of psychiatry.  -has appt with psychiatry in one week 3.  OSAS  -has AM headache.  Is noncompliant with CPAP.  Will schedule a split night study 4.  Sialorrhea  -refuses botox.  Will let me know if changes mind 5.  Memory loss  -neuropsych done a year ago and no evidence of neurodegenerative change.  Thinks getting worse and will repeat.  I still think that depression is a strong contributor.  Also think that sleep apnea is contributor.   6.  Urinary incontinence, frequency and nocturia  -will make appt with urology 7.  Follow-up with me will be in the next 4 months, sooner should new neurologic issues arise.      Much greater than 50% of this visit was spent in counseling with the patient and the family.  Total face to face time:  40 min.  Pt likely will be retiring in the next 5 weeks.

## 2016-03-29 NOTE — Telephone Encounter (Signed)
Notes faxed to [facility] at [phone] with confirmation received.

## 2016-03-29 NOTE — Patient Instructions (Signed)
1. Sleep study scheduled for Wednesday 05/11/16 at 8:00 pm at [facility]. They will mail a packet of information to you. They can be reached at [phone]. 2. You are scheduled at [facility] for an evaluation. Appt is scheduled for 04/08/16 at 12:45 pm with Dr. McDiarmid. If this is not a good date/time please call [phone] to reschedule.

## 2016-04-19 ENCOUNTER — Ambulatory Visit (INDEPENDENT_AMBULATORY_CARE_PROVIDER_SITE_OTHER): Payer: Federal, State, Local not specified - PPO | Admitting: Psychology

## 2016-04-19 ENCOUNTER — Encounter: Payer: Self-pay | Admitting: Psychology

## 2016-04-19 DIAGNOSIS — R413 Other amnesia: Secondary | ICD-10-CM | POA: Diagnosis not present

## 2016-04-19 DIAGNOSIS — G20 Parkinson's disease: Secondary | ICD-10-CM

## 2016-04-19 DIAGNOSIS — G3184 Mild cognitive impairment, so stated: Secondary | ICD-10-CM | POA: Diagnosis not present

## 2016-04-19 DIAGNOSIS — F331 Major depressive disorder, recurrent, moderate: Secondary | ICD-10-CM

## 2016-04-19 NOTE — Progress Notes (Signed)
NEUROPSYCHOLOGICAL INTERVIEW (CPT: [id number])  Name: [name] [name] Date of Birth: 1951-05-13 Date of Interview: 04/19/2016  Reason for Referral:  [name] [name] is a 65 y.o., married male who is referred for neuropsychological evaluation by Dr. [name] Tat of [facility] due to concerns about worsening memory in the context of Parkinson's disease. This patient is accompanied in the office by his wife who supplements the history.  History of Presenting Problem:  Mr. [name] [name] is known to me from a prior neuropsychological evaluation I completed with him at [facility] in June 2016. At that time, he and his wife were reporting weakness, stiffness, and tremor which had started approximately 18 mos prior, as well as cognitive decline over the past 12-18 months prior. Neuropsychological testing revealed relative weaknesses in processing speed, bilateral manual dexterity and aspects of executive functioning. 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 He also reported a long history of major depressive disorder.   At this present appointment, the patient and his wife report progressive cognitive decline since his last evaluation with me just over a year ago. They report worsening short term memory loss (i.e., forgetfulness for recent conversations and events, repeating statements/questions). They also report continued problems with attention lapses (i.e., forget to zip his pants after using the restroom, forget why he walked into a room), and oral expression (i.e., paraphasic errors, word finding difficulty, difficulty expressing thoughts). These cognitive symptoms reportedly occur on a daily basis. They denied any problems with visual-spatial perception.  Since his last evaluation, the patient has reduced his work hours to part-time (10 hours a week). He works four hour shifts. He was awarded [employer]. He  will be retiring at the end of this month. He drives very rarely; his wife does not let him drive most of the time. He thinks his driving has improved, and states that instead of "going all over the road" he is now just "going all over my lane". His wife thinks his peripheral vision has worsened. He has his yearly optometry exam in August. The patient continues to manage his medications with no reported difficulty. With regard to other IADLs, the patient states, "My wife is my caregiver. She does everything." She manages the bills and finances, cooking, and appointments.   Physically, he and his wife report that he moves very slowly. He has not had any falls. He cannot walk very much without getting very fatigued. He is sleeping more, and he falls asleep without meaning to during the day. He has not worn his CPAP in about two years; he had lost weight and no longer needed it, but he has gained weight again and probably needs to be wearing it. He is scheduled for a sleep study. His wife notes that he is talking in his sleep which is new for him. She stated he also moves around in his sleep. The patient reported worsening urinary incontinence. He started Myrbetriq and feels this is helping somewhat. The patient's wife reported that at his last PCP visit, his blood sugars were high, so they have made significant dietary changes to reduce his sugar intake.   With regard to mood, the patient reports, "My depression is gone." He denies sad mood and irritability, which were problems for him in the past. He does report a "lack of urgency" and possible lack of motivation. However, he denies apathy. He is concerned about his cognitive difficulties and worries that he  is developing Alzheimer's disease. His father, two paternal aunts, and his paternal grandmother all had dementia in their later years. He denies suicidal ideation or intention. When asked about visual distortions or hallucinations, he reports that he  sometimes sees something moving in his peripheral vision and thinks there is someone crossing into their yard, but when he turns his head, there is nothing there.   Psychiatric History: The patient has a history of chronic depression and has been treated with medication for 30-40 years. He has been tried on several antipsychotics in the past for depression including Geodon and Latuda. He denied history of psychiatric hospitalizations or suicide attempts. He is currently followed by a psychiatrist and is treated with Celexa and Wellbutrin.   Social History: Born/Raised: Born in [place], moved frequently as a child Education: Completed a bachelor's degree from [employer] Occupational history: Served in the [employer] for 6 years, worked in Artist positions, currently works for [employer] but is retiring at the end of this month. Is also collecting SSDI. Marital history: Married x40 years with three adult children. Alcohol/Tobacco/Substances: The patient does not drink alcohol. Has never been a smoker or tobacco user. No illicit drug use reported.  Medical History: Past Medical History  Diagnosis Date  . Headache(784.0)   . Cardiac murmur     as a child  . Streptococcal meningitis     as an infant  . Depression   .  OSA (obstructive sleep apnea) 01/17/2011    npsg 2012:  AHI 67/hr. Auto titration 2012:  Optimal pressure 12cm.   Marland Kitchen HEADACHES, HX OF 02/18/2008    Qualifier: Diagnosis of  By: Danny Lawless CMA, [place]    . APPENDECTOMY, HX OF 02/18/2008    Qualifier: Diagnosis of  By: Danny Lawless CMA, [place]    . Parkinson disease ([place]) 11/2014      Current Medications:  Outpatient Encounter Prescriptions as of 04/19/2016  Medication Sig  . B Complex-C (B-COMPLEX WITH VITAMIN C) tablet Take 1 tablet by mouth daily.  Marland Kitchen buPROPion (WELLBUTRIN XL) 300 MG 24 hr tablet Take 1 tablet (300 mg total) by mouth every morning.  . citalopram (CELEXA) 40 MG tablet Take 1 tablet (40 mg total) by mouth daily.  Marland Kitchen  ketoconazole (NIZORAL) 2 % cream Apply 1 application topically 2 (two) times daily.  . Omega 3-6-9 Fatty Acids (OMEGA 3-6-9 PO) Take by mouth.  Marland Kitchen rOPINIRole (REQUIP) 2 MG tablet Take 2 tablets in the AM, 1 in the afternoon and 1 in the evening  . sildenafil (VIAGRA) 50 MG tablet Take 1 tablet (50 mg total) by mouth daily as needed for erectile dysfunction.  . TURMERIC CURCUMIN PO Take by mouth.   No facility-administered encounter medications on file as of 04/19/2016.   The patient reported he is also now taking Myrbetriq.    Behavioral Observations:   Appearance: Appropriately dressed and groomed Gait: Ambulated independently, stiffness observed Speech: Fluent; monotone. Mild word finding difficulty, mild phonemic paraphasia on one occasion. Thought process: Generally linear although does become distracted at times and lose train of thought Affect: Blunted Interpersonal: Pleasant, appropriate   TESTING: There is medical necessity to proceed with neuropsychological assessment as the results will be used to aid in differential diagnosis and clinical decision-making and to inform specific treatment recommendations. Per the patient, his wife and medical records reviewed, there has been a change in cognitive functioning with worsening since his last evaluation a year ago. There is a reasonable suspicion of dementia due to PD.  Differential diagnoses also include AD and pseudodementia.   PLAN: The patient will return for a full battery of neuropsychological testing with a psychometrician under my supervision. Education regarding testing procedures was provided. Subsequently, the patient will see this provider for a follow-up session at which time his test performances and my impressions and treatment recommendations will be reviewed in detail.   Full neuropsychological evaluation report to follow.

## 2016-05-04 ENCOUNTER — Ambulatory Visit (INDEPENDENT_AMBULATORY_CARE_PROVIDER_SITE_OTHER): Payer: Federal, State, Local not specified - PPO | Admitting: Psychology

## 2016-05-04 DIAGNOSIS — R413 Other amnesia: Secondary | ICD-10-CM

## 2016-05-04 DIAGNOSIS — G20 Parkinson's disease: Secondary | ICD-10-CM

## 2016-05-04 DIAGNOSIS — G3184 Mild cognitive impairment, so stated: Secondary | ICD-10-CM | POA: Diagnosis not present

## 2016-05-05 NOTE — Progress Notes (Signed)
   Neuropsychology Note  [name] [name] returned today for 3 hours of neuropsychological testing with technician, Milana Kidney, BS, under the supervision of Dr. Macarthur Critchley. The patient did not appear overtly distressed by the testing session, per behavioral observation or via self-report to the technician. Rest breaks were offered. [name] [name] will return within 2 weeks for a feedback session with Dr. Si Raider at which time his test performances, clinical impressions and treatment recommendations will be reviewed in detail. The patient understands he can contact our office should he require our assistance before this time.  Full report to follow.

## 2016-05-11 ENCOUNTER — Ambulatory Visit (HOSPITAL_BASED_OUTPATIENT_CLINIC_OR_DEPARTMENT_OTHER): Payer: Federal, State, Local not specified - PPO | Attending: Neurology | Admitting: Internal Medicine

## 2016-05-11 VITALS — Ht 74.0 in | Wt 280.0 lb

## 2016-05-11 DIAGNOSIS — G4733 Obstructive sleep apnea (adult) (pediatric): Secondary | ICD-10-CM | POA: Diagnosis present

## 2016-05-11 DIAGNOSIS — R5383 Other fatigue: Secondary | ICD-10-CM | POA: Insufficient documentation

## 2016-05-11 DIAGNOSIS — G4700 Insomnia, unspecified: Secondary | ICD-10-CM | POA: Diagnosis not present

## 2016-05-11 DIAGNOSIS — I493 Ventricular premature depolarization: Secondary | ICD-10-CM | POA: Diagnosis not present

## 2016-05-11 DIAGNOSIS — R0683 Snoring: Secondary | ICD-10-CM | POA: Diagnosis not present

## 2016-05-11 DIAGNOSIS — E669 Obesity, unspecified: Secondary | ICD-10-CM | POA: Insufficient documentation

## 2016-05-11 DIAGNOSIS — Z6836 Body mass index (BMI) 36.0-36.9, adult: Secondary | ICD-10-CM | POA: Diagnosis not present

## 2016-05-11 NOTE — Progress Notes (Signed)
NEUROPSYCHOLOGICAL EVALUATION   Name:    [name] [name]  Date of Birth:   05/19/1951 Date of Interview:  04/19/2016 Date of Testing:  8/11/05/2015  Date of Feedback:  05/12/2016     Background Information:  Reason for Referral:  [name] [name] is a 65 y.o., married male referred by Dr. [name] Tat to assess his current level of cognitive functioning and assist in differential diagnosis. The current evaluation consisted of a review of available medical records, an interview with the patient and his wife, and the completion of a neuropsychological testing battery. Informed consent was obtained.  History of Presenting Problem:  Mr. [name] [name] is known to me from a prior neuropsychological evaluation I completed with him at [facility] in June 2016. At that time, he and his wife were reporting weakness, stiffness, and tremor which had started approximately 18 mos prior, as well as cognitive decline over the past 12-18 months prior. Neuropsychological testing revealed relative weaknesses in processing speed, bilateral manual dexterity and aspects of executive functioning. 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 He also reported a long history of major depressive disorder.   At this present appointment, the patient and his wife report progressive cognitive decline since his last evaluation with me just over a year ago. They report worsening short term memory loss (i.e., forgetfulness for recent conversations and events, repeating statements/questions). They also report continued problems with attention lapses (i.e., forget to zip his pants after using the restroom, forget why he walked into a room), and oral expression (i.e., paraphasic errors, word finding difficulty, difficulty expressing thoughts). These cognitive symptoms reportedly occur on a daily basis. They denied any problems with  visual-spatial perception.  Since his last evaluation, the patient has reduced his work hours to part-time (10 hours a week). He works four hour shifts. He was awarded [employer]. He will be retiring at the end of this month. He drives very rarely; his wife does not let him drive most of the time. He thinks his driving has improved, and states that instead of "going all over the road" he is now just "going all over my lane". His wife thinks his peripheral vision has worsened. He has his yearly optometry exam in August. The patient continues to manage his medications with no reported difficulty. With regard to other IADLs, the patient states, "My wife is my caregiver. She does everything." She manages the bills and finances, cooking, and appointments.   Physically, he and his wife report that he moves very slowly. He has not had any falls. He cannot walk very much without getting very fatigued. He is sleeping more, and he falls asleep without meaning to during the day. He has not worn his CPAP in about two years; he had lost weight and no longer needed it, but he has gained weight again and probably needs to be wearing it. He is scheduled for a sleep study. His wife notes that he is talking in his sleep which is new for him. She stated he also moves around in his sleep. The patient reported worsening urinary incontinence. He started Myrbetriq and feels this is helping somewhat. The patient's wife reported that at his last PCP visit, his blood sugars were high, so they have made significant dietary changes to reduce his sugar intake.   With regard to mood, the patient reports, "My depression is gone." He denies sad mood and irritability, which  were problems for him in the past. He does report a "lack of urgency" and possible lack of motivation. However, he denies apathy. He is concerned about his cognitive difficulties and worries that he is developing Alzheimer's disease. His father, two paternal aunts, and his  paternal grandmother all had dementia in their later years. He denies suicidal ideation or intention. When asked about visual distortions or hallucinations, he reports that he sometimes sees something moving in his peripheral vision and thinks there is someone crossing into their yard, but when he turns his head, there is nothing there.   Psychiatric History: The patient has a history of chronic depression and has been treated with medication for 30-40 years. He has been tried on several antipsychotics in the past for depression including Geodon and Latuda. He denied history of psychiatric hospitalizations or suicide attempts. He is currently followed by a psychiatrist and is treated with Celexa and Wellbutrin.   Social History: Born/Raised: Born in [place], moved frequently as a child Education: Completed a bachelor's degree from [employer] Occupational history: Served in the [employer] for 6 years, worked in Artist positions, currently works for [employer] but is retiring at the end of this month. Is also collecting SSDI. Marital history: Married x40 years with three adult children. Alcohol/Tobacco/Substances: The patient does not drink alcohol. Has never been a smoker or tobacco user. No illicit drug use reported.   Medical History:  Past Medical History:  Diagnosis Date  .  OSA (obstructive sleep apnea) 01/17/2011   npsg 2012:  AHI 67/hr. Auto titration 2012:  Optimal pressure 12cm.   . APPENDECTOMY, HX OF 02/18/2008   Qualifier: Diagnosis of  By: Rutledge, [place]    . Cardiac murmur    as a child  . Depression   . Headache(784.0)   . HEADACHES, HX OF 02/18/2008   Qualifier: Diagnosis of  By: Danny Lawless CMA, [place]    . Parkinson disease ([place]) 11/2014  . Streptococcal meningitis    as an infant    Current medications:  Outpatient Encounter Prescriptions as of 05/12/2016  Medication Sig  . B Complex-C (B-COMPLEX WITH VITAMIN C) tablet Take 1 tablet by mouth daily.  Marland Kitchen buPROPion  (WELLBUTRIN XL) 300 MG 24 hr tablet Take 1 tablet (300 mg total) by mouth every morning.  . citalopram (CELEXA) 40 MG tablet Take 1 tablet (40 mg total) by mouth daily.  Marland Kitchen ketoconazole (NIZORAL) 2 % cream Apply 1 application topically 2 (two) times daily.  . Omega 3-6-9 Fatty Acids (OMEGA 3-6-9 PO) Take by mouth.  Marland Kitchen rOPINIRole (REQUIP) 2 MG tablet Take 2 tablets in the AM, 1 in the afternoon and 1 in the evening  . sildenafil (VIAGRA) 50 MG tablet Take 1 tablet (50 mg total) by mouth daily as needed for erectile dysfunction.  . TURMERIC CURCUMIN PO Take by mouth.   No facility-administered encounter medications on file as of 05/12/2016.      Current Examination:  Behavioral Observations:  Appearance: Appropriately dressed and groomed Gait: Ambulated independently, stiffness observed Speech: Fluent; monotone. Mild word finding difficulty, mild phonemic paraphasia on one occasion. Thought process: Generally linear although does become distracted at times and lose train of thought Affect: Blunted Interpersonal: Pleasant, appropriate Orientation: Oriented to all spheres  Tests Administered: . Test of Premorbid Functioning (TOPF) . Wechsler Adult Intelligence Scale-Fourth Edition (WAIS-IV): Similarities, Block Design, Matrix Reasoning, Arithmetic, Symbol Search, Coding and Digit Span subtests . Wechsler Memory Scale-Fourth Edition (WMS-IV) Adult Version (ages 63-69): Logical Memory  I, II and Recognition subtests  . Wisconsin Verbal Learning Test - 2nd Edition (CVLT-2) Alternate Form . Rey Complex Figure Test (RCFT) . Neuropsychological Assessment Battery (NAB) Language Module, Form 1: Auditory Comprehension and Naming Subtests . Symbol Digit Modalities Test (SDMT) . Controlled Oral Word Association Test (COWAT) . Trail Making Test A and B . Clock drawing test . Generalized Anxiety Disorder - 7 item screener (GAD-7) . Beck Depression Inventory - Second edition (BDI-II) . Parkinson's  Disease Questionnaire (PDQ-39)  Test Results: Note: Standardized scores are presented only for use by appropriately trained professionals and to allow for any future test-retest comparison. These scores should not be interpreted without consideration of all the information that is contained in the rest of the report. The most recent standardization samples from the test publisher or other sources were used whenever possible to derive standard scores; scores were corrected for age, gender, ethnicity and education when available.   Test Scores:   Test Name Standardized Score Descriptor Clinically significant change from last evaluation  TOPF SS= 113 High average No change  WAIS-IV Subtests     Similarities ss= 11 Average Reduced (ss=14)  Block Design ss= 7 Low average Not administered at last eval  Matrix reasoning ss= 10 Average Mildly reduced (ss=12)  WAIS-IV Index scores     Working Memory SS= 97 Average No change  Processing Speed SS= 81 Low average No change  WMS-IV subtests     LM I ss= 13 High average No change  LM II ss= 12 High average No change  LM II recognition Cumulative percentage: >75 Above average No change  CVLT-II Scores     Trial 1 Z= -1.5 Borderline No change  Trial 5 Z= 1 High average No change  Trials 1-5 total T= 62 High average  No change  SD Free Recall Z= 2 Very superior  No change  SD Cued Recall Z= 2 Very superior  No change  LD Free Recall Z= 1 High average  No change  LD Cued Recall Z= 2 Very superior  No change  Recognition Discriminability (16/16 hits, 0 false positive) Z= 2 Very superior  No change  RCFT     Copy 33/36;  >16 %ile WNL No change  3 minute delay T= 44 Average Reduced (t=53)  30 minute delay T= 49 Average Reduced (t=62)  Recognition T= 48 Average No change  NAB Language subtests     Auditory comprehension T= 55 Average Not administered at last evaluation  Naming T= 54 Average Not administered at last evaluation  SDMT (Oral) Z= -1.8  Borderline Not administered at last evaluation  COWAT-FAS T= 46 Average No change  COWAT-Animals T= 40 Low average Reduced (t=53)  Trail Making Test A 0 errors T= 36 Borderline No change  Trail Making Test B 0 errors T= 17 Severely impaired Reduced (t=30)  Clock Drawing  WNL  No change  GAD-7 4/21 WNL Improved (11/21)  BDI-II 23/63 Moderate Improved (34/63)  PDQ-39     Mobility 67.5%  Reporting more problems  Activities of daily living 45.8%  Reporting more problems  Emotional well being 54.16%    Stigma 56.25%  Reporting more problems  Social support 58.3%    Cognitive impairment 68.75%  Reporting more problems  Communication 66.6%    Bodily discomfort 41.6%          Description of Test Results:  Premorbid verbal intellectual abilities were estimated to have been within the high average range based on a test  of word reading. Psychomotor processing speed was low average . When the motor component was removed, processing speed did not improve and in fact fell within the borderline impaired range. Auditory attention and working memory were average. Visual-spatial construction was low average to average. Language abilities were somewhat variable. Specifically, confrontation naming and auditory comprehension were intact, while semantic verbal fluency was low average, representing decline since his last evaluation. With regard to verbal memory, encoding and acquisition of non-contextual information (i.e., word list) was initially impaired on the first learning trial but improved to high average across five learning trials. After a brief interference task, free recall was very superior. After a delay, free recall was high average. Cued recall was superior. Performance on a yes/no recognition task was superior with 100% accuracy. On another verbal memory test, encoding and acquisition of contextual auditory information (i.e., short stories) was high average. After a delay, free recall was high  average. Performance on a yes/no recognition task was above average. With regard to non-verbal memory, delayed free recall of visual information was average, representing mild decline from his last evaluation. Performance on a yes/no recognition task was average. Executive functioning was variable. Mental flexibility and set-shifting were severely impaired on Trails B, not due to errors but due to increased time to complete the task. Verbal fluency with phonemic search restrictions was average. Verbal abstract reasoning was average, but reduced from his last evaluation. Non-verbal abstract reasoning was average, also mildly reduced from his last evaluation. Performance on a clock drawing task was intact. On self-report questionnaires, the patient's responses suggested an improvement in symptoms of depression and anxiety relative to his last evaluation. He did not endorse clinically significant anxiety. He did endorse symptoms consistent with depression, albeit reduced compared to last year. He denied sadness but endorsed loss of pleasure, presumably due to changes in physical functioning. He endorsed disappointment in himself, feelings of failure, and feelings of worthlessness. He denied suicidal ideation or intention.  On the PDQ-39, he reported that PD symptoms significantly interfere with quality of life, and he reported this has increased since last year. He endorsed reduced mobility, problems with ADLs, reduced emotional wellbeing, stigma, cognitive impairment, communication problems, and bodily discomfort.   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  While there were some areas of mild decline (e.g., semantic verbal fluency, abstract reasoning), results of this evaluation were largely consistent with his performances just over a year ago. Test results continue to reflect mild frontal-subcortical dysfunction  (i.e., weaknesses in processing speed, aspects of executive function, with relatively preserved memory and language). This could be due to PD and untreated sleep apnea. He does not meet criteria for a dementia syndrome based on his test results. Fortunately, he is reporting less depression and anxiety compared to last year; however, I suspect there are longstanding personality issues that may cause him to focus more on his deficits or have more of a negative bias in general.    Recommendations/Plan: Based on the findings of the present evaluation, the following recommendations are offered:  1. Treatment for sleep apnea is highly recommended. He is scheduled for a sleep study.  2. He may benefit from mindfulness exercises to assist in improving focus and also mood. 3. Increased activity is recommended. In particular, activities providing cardiovascular exercise, mental stimulation and social interaction are highly recommended.  4. He will continue to be followed by psychiatry. He is stable on his current  psychotropic medication regimen. 5. Neuropsychological re-evaluation can be performed in 1-2 years if there is a change in cognitive function or behavior reported or observed.    Feedback to Patient: Whalen Overholt Stuckert returned for a feedback appointment on 05/12/2016  to review the results of his neuropsychological evaluation with this provider. 30 minutes face-to-face time was spent reviewing his test results, my impressions and my recommendations as detailed above.    Total time spent on this patient's case: [id number] unit for interview with psychologist; [phone] units of testing by psychometrician under psychologist's supervision; [phone] units for medical record review, scoring of neuropsychological tests, interpretation of test results, preparation of this report, and review of results to the patient by psychologist.      Thank you for your referral of Dara A Fullman. Please feel free to  contact me if you have any questions or concerns regarding this report.

## 2016-05-12 ENCOUNTER — Ambulatory Visit (INDEPENDENT_AMBULATORY_CARE_PROVIDER_SITE_OTHER): Payer: Federal, State, Local not specified - PPO | Admitting: Psychology

## 2016-05-12 DIAGNOSIS — G20 Parkinson's disease: Secondary | ICD-10-CM

## 2016-05-12 DIAGNOSIS — G3184 Mild cognitive impairment, so stated: Secondary | ICD-10-CM

## 2016-05-12 DIAGNOSIS — R413 Other amnesia: Secondary | ICD-10-CM | POA: Diagnosis not present

## 2016-05-12 DIAGNOSIS — F331 Major depressive disorder, recurrent, moderate: Secondary | ICD-10-CM

## 2016-05-12 NOTE — Patient Instructions (Signed)
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      Recommendations/Plan: Based on the findings of the present evaluation, the following recommendations are offered:  1. Treatment for sleep apnea is highly recommended. He is scheduled for a sleep study.  2. He may benefit from mindfulness exercises to assist in improving focus and also mood. 3. Increased activity is recommended. In particular, activities providing cardiovascular exercise, mental stimulation and social interaction are highly recommended.  4. He will continue to be followed by psychiatry. He is stable on his current psychotropic medication regimen. 5. Neuropsychological re-evaluation can be performed in 1-2 years if there is a change in cognitive function or behavior reported or observed.

## 2016-05-17 ENCOUNTER — Telehealth: Payer: Self-pay | Admitting: Neurology

## 2016-05-17 NOTE — Telephone Encounter (Signed)
Spoke with patient's wife and she needs forms redone for after the date patient retired. She will bring them to patient's appt.   Patient is doing well- going to [facility] twice a week.   Will call with any questions prior to appt.

## 2016-05-17 NOTE — Telephone Encounter (Signed)
PT's wife [name] called and wanted a call back/Dawn CB#[phone]

## 2016-05-28 DIAGNOSIS — G4733 Obstructive sleep apnea (adult) (pediatric): Secondary | ICD-10-CM

## 2016-05-28 NOTE — Procedures (Signed)
   Patient Name: [name] [name], [name] [name] Date: 05/11/2016 Gender: Male D.O.B: 1950/12/11 Age (years): 84 Referring Provider: [name] Tat Height (inches): 7 Interpreting Physician: Baird Lyons MD, ABSM Weight (lbs): 280 RPSGT: Carolin Coy BMI: 36 MRN: [id number] Neck Size: 19.00 CLINICAL INFORMATION Sleep Study Type: NPSG Indication for sleep study: Fatigue, Obesity, OSA, Snoring Epworth Sleepiness Score: 15 Most recent polysomnogram dated 12/13/2010 revealed an AHI of 66.9/h and RDI of 73.8/h.  SLEEP STUDY TECHNIQUE As per the AASM Manual for the Scoring of Sleep and Associated Events v2.3 (April 2016) with a hypopnea requiring 4% desaturations. The channels recorded and monitored were frontal, central and occipital EEG, electrooculogram (EOG), submentalis EMG (chin), nasal and oral airflow, thoracic and abdominal wall motion, anterior tibialis EMG, snore microphone, electrocardiogram, and pulse oximetry.  MEDICATIONS Patient's medications include: charted for review Medications self-administered by patient during sleep study : No sleep medicine administered.  SLEEP ARCHITECTURE The study was initiated at 11:12:00 PM and ended at 5:31:07 AM. Sleep onset time was 4.4 minutes and the sleep efficiency was 56.2%. The total sleep time was 213.0 minutes. Stage REM latency was 343.5 minutes. The patient spent 40.85% of the night in stage N1 sleep, 45.07% in stage N2 sleep, 0.00% in stage N3 and 14.08% in REM. Alpha intrusion was absent. Supine sleep was 67.11%. Wake after sleep onset  161 minutes  RESPIRATORY PARAMETERS The overall apnea/hypopnea index (AHI) was 5.6 per hour. There were 3 total apneas, including 2 obstructive, 1 central and 0 mixed apneas. There were 17 hypopneas and 7 RERAs. The AHI during Stage REM sleep was 20.0 per hour. AHI while supine was 8.4 per hour. The mean oxygen saturation was 91.47%. The minimum SpO2 during sleep was 87.00%. Moderate snoring  was noted during this study.  CARDIAC DATA The 2 lead EKG demonstrated sinus rhythm. The mean heart rate was 64.40 beats per minute. Other EKG findings include: PVCs.  LEG MOVEMENT DATA The total PLMS were 0 with a resulting PLMS index of 0.00. Associated arousal with leg movement index was 0.0 .  IMPRESSIONS - Minimal obstructive sleep apnea occurred during this study (AHI = 5.6/h). - No significant central sleep apnea occurred during this study (CAI = 0.3/h). - Mild oxygen desaturation was noted during this study (Min O2 = 87.00%). - The patient snored with Moderate snoring volume. - Substantial difficulty maintaining sleep with frequent arousals and awakenings. Awake for 161 minutes after sleep onset - EKG findings include PVCs. - Clinically significant periodic limb movements did not occur during sleep. No significant associated arousals  DIAGNOSIS - Obstructive Sleep Apnea (327.23 [G47.33 ICD-10]) - Difficulty maintaining sleep- insomnia  RECOMMENDATIONS - Positional therapy avoiding supine position during sleep. - Very mild obstructive sleep apnea. Return to discuss treatment options. - Management for insomnia based on clinical assessment - Avoid alcohol, sedatives and other CNS depressants that may worsen sleep apnea and disrupt normal sleep architecture. - Sleep hygiene should be reviewed to assess factors that may improve sleep quality. - Weight management and regular exercise should be initiated or continued if appropriate.  [Electronically signed] 05/28/2016 10:33 AM  Baird Lyons MD, ABSM Diplomate, American Board of Sleep Medicine   NPI: [id number]  [place], American Board of Sleep Medicine  ELECTRONICALLY SIGNED ON:  05/28/2016, 10:27 AM [place] PH: [phone]   FX: [phone] [place]

## 2016-05-30 NOTE — Progress Notes (Signed)
[name] [name] was seen today in the movement disorders clinic for neurologic consultation at the request of [name], MD.  The consultation is for the evaluation of slowness, flat affect and to r/o a neurologic disorder.  This patient is accompanied in the office by his spouse who supplements the history.  The first symptom(s) the patient noticed was a feeling of weakness that has been going on for about a year.  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  For about a year and a half he has noted a "body" tremor and his wife has noted a hand tremor in both hands (at rest).  He is stiff like he is "a 65 year old man."  Once he is in bed he finds it is hard to move.  The patient has a hx of significant depression and is on multiple medications related to this.  He was started on cogentin just yesterday, but is also on celexa, wellbutrin and latuda.  He has been on [place] for 1.5 months.  Prior to that he was on abilify (tried on 2 separate times but most recently was only on it for a day) and geodon(doesn't remember this but looks like it may have been given in the past).  He has been on risperdal but cannot remember how long ago.  ECT was offered but his wife does not want him to proceed with that  11/20/14 update:  The patient returns today for follow-up.  He is accompanied by his wife who supplements the history.  He has been off of [place] for about 2 weeks.  Unfortunately, he was unable to afford the dat scan.  He is on just Wellbutrin and Celexa for depression and his Cogentin and has been discontinued.  He has noted a decreased appetite and dry mouth and having trouble sleeping because of stiffness.  Pt states that he is almost afraid to go to sleep.  Missing 2 days of work per week (works nights) but a lot of that because of anxiety.  His wife notices increased tremor.  He has slow at work.  Admits to some  lightheadedness but no syncope.  Note diplopia.  No hallucinations.  His wife is doing most of the driving because he is having difficulty staying in his own lane.  02/19/15 update:  The patient is following up today regarding his parkinsonism.  I reviewed records from his psychiatrist since last visit.  His psychiatry note from 12/04/14 indicates that the patient stated that tremor was better.  However, I received a call 4 days later stating that tremor was worse and he thought it was from the Mirapex.  He wanted to change the Mirapex to something else.  He was on Mirapex 0.5 mg 3 times per day at the time.  We ended up switching it to Requip 1 mg 3 times a day.  Fortunately he is doing better.  He denies any side effects with the Requip.  He currently takes it at 8 AM/2 PM/8 PM.  He does state that he notices that he "shuffles with the left leg."  He has not fallen but sometimes feels off balance.  He states that he finished his physical/occupational/speech therapy and feels that it went well.  He is not exercising on his own.  He initially states that he does not feel that he has time, but also states that he does not  go into work until 2 PM and works until 10:30 PM.  This is a change for him.  He was able to change his job at [employer] and seems like that is doing better.  His wife mentions that he continues to have memory loss that she thinks has increased, but she also states that she has noticed memory loss ever since starting on antidepressants.  He had one fall since last visit.  This was when he was taking the trash to the street.  He had no fractures with this, just a superficial scrape.    05/25/15 update:  The patient presents today, accompanied by his wife who supplements the history.  His Requip was increased last visit so that was taking 2 tablets in the morning, one in afternoon and 2 in the evening and 1 day after I started this, I received a call from his wife that the patient did not  tolerate it and it made symptoms worse (which is what they reported with Mirapex).  While that was very odd to me, I told them to go back down to 1 mg 3 times per day.  He actually comes back on 1 mg, 1 in the AM, 2 in the afternoon and 1 in the evening.  He reports that he is doing worse.  He has more tremor and he feels tired in the evening.  Pts wife asks about trying to go back up on the requip slowly.   He had neuropsych testing in June at [facility].  I reviewed that.  There was no evidence of dementia.  There was evidence of mild cognitive impairment from Parkinson's disease as well as from chronic major depressive disorder.  It is recommended that the patient consider transcranial magnetic stimulation for depression, and he told the neuropsychologist that he considered in the past and opted against it.  She encouraged him to attend a Parkinson's support group and he told her that he tried that once and it was not helpful.  He still c/o "my memory has been terrible and especially over the last few weeks."  He feels that he is not depressed - "that is well controlled."  He is not doing CV exercise.  He is still in his new job but it seems more physical than his old job and he thinks he is not as fast in getting tasks done.  He asks about DBS.  08/25/15 update:  The patient is following up today, accompanied by his wife who supplements the history.  I have reviewed records since our last visit.  He is on Requip XL, which was increased to 6 mg at night last visit.  He changed that back to 2 mg tid of the regular ropinirole because of cost.   He is enrolled in physical therapy.  "I'm doing bad."  When asked to describe how he feels bad, he has trouble describing it.  He had a root canal and hasn't felt well since.  He hasn't been back to work for 4 weeks.  He feels that his speech is low and states that he went to voice therapy 2 weeks ago.  He isn't practicing the voice therapy faithfully.  He is  riding the bike on the highest resistance 20 mins a day.  He is having word finding trouble.   No falls but feels that he is "weaker."  He asks me about going to "light duty" at work and possibly retirement in the near future.  He  continues to see psychiatry for his depression.  He last saw psychiatry on 06/04/2015.  No changes were made in medication as he stated that mood was good and continues to reaffirm that.    11/30/15 update:  The patient follows up today, accompanied by his wife who supplements the history.  Last visit, his ropinirole was increased to 4 mg in the morning (8am but goes back to bed for another 1 hour or so), another 2 mg in the afternoon (1-2 pm) and evening (8-8:30pm).  I changed him to light duty at work last visit at his request.  He called me recently and wanted changed from being able to work 4 hours per day to a total of only 10 hours per week.  I told him that he would need a functional capacity evaluation for this, as I was not sure that this made sense from a Parkinson standpoint, given his stage of Parkinson's disease.  He was fairly frustrated with this recommendation, but I told him that I needed some objective evidence.  He reports that he is awaiting an appt for the FCE (has called).   He has been seeing psychiatry and I  actually received a call from his psychiatrist back in December that his psychiatrist felt that he needed to be on levodopa.  I did not think that was the case, but I was willing to do a levodopa challenge to see if that was the case.  He was scheduled to have that done, but the patient canceled that.  Does report that he thinks that depression under good control.   He denies falls.  Some lightheadedness when first gets up, but no near syncope.  Using bike daily for 15-20 minutes.  No hallucinations; rare visual distortions.  Wife thinks that he seems more stiff and has involuntary "tics and shaking" at night.  Waking up with AM headache.  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  03/29/16 update:  The patient follows up today, accompanied by his wife who supplements the history.  Last visit, his ropinirole was increased to 4 mg in the morning, 2 mg in the afternoon and 2 mg in the evening.  Has intermittent spells of increased stiffness, slower on stairs.  Riding recumbant bike at home.  He has been seeing psychiatry and is still on celexa and wellbutrin.  I reviewed last records with Dr. Adele Schilder and pt continued to c/o memory issues (long standing c/o) and was told to discuss with me as may be PD related.  Pt had neuropsych testing done about a year ago and demonstrated no neurodegenerative memory change related to parkinsons but did feel that depression was an issue affecting cognition.  Thinks that memory is worse. No falls since last visit.  He has attended PT/OT/ST from March-May.  He c/o drooling.  Also c/o talking at night and some tremor at night.  Happens most night.  Also c/o snoring and wife thinks that is due to weight and trying to cut back on sugars.  Has OSAS but doesn't use the CPAP.  Has some visual distortions but no hallucinations.  Can be frightening.  Having some urinary incontinence.  Saw urology in the past but not for incontinence.    05/30/16 update:  The patient follows up today, accompanied by his wife who supplements the history.  He is on ropinirole, 4 mg in the morning, 2 mg in the afternoon and 2 mg in the evening.  He denies compulsive behaviors.  He denies sleep attacks.  He is now attending the [employer] gym and feels he is getting benefit from this.  He is getting scholarship information.  He had repeat neuropsych testing on August 2 with a follow-up with Dr. Si Raider on August 10.  This was largely consistent with his performance one year prior.  There is mild cognitive impairment consistent with Parkinson's disease and untreated obstructive sleep apnea syndrome.  There was evidence of major depressive disorder,  and Dr. Si Raider felt that there was long-standing personality issues that influenced the patient's view of the world in a negative fashion.  He saw urology since last visit and I did get a note from them.  It was not clear what medication he was tried on, just stated that he tried a beta 3 agonist (?  Myrbetriq).  Pt states that it was in fact myrbetriq that was added.  Pt states that it hasn't helped but wife thinks that he isn't getting up as much during the night.  He was getting up 3 times during the night and now he may get up 1-2 times during the night.  Having some dizziness when gets up for about 2 min.  Drinking 2 L of water a day.  Given up sugar completely.  I did order a split night PSG last time but results are not available.  He c/o increased drooling.  Neuroimaging has previously been performed.  It is available for my review today and I reviewed it with him.  There is no BG disease.  ALLERGIES:  No Known Allergies  CURRENT MEDICATIONS:  Outpatient Encounter Prescriptions as of 06/01/2016  Medication Sig  . B Complex-C (B-COMPLEX WITH VITAMIN C) tablet Take 1 tablet by mouth daily.  Marland Kitchen buPROPion (WELLBUTRIN XL) 300 MG 24 hr tablet Take 1 tablet (300 mg total) by mouth every morning.  . citalopram (CELEXA) 40 MG tablet Take 1 tablet (40 mg total) by mouth daily.  Marland Kitchen ketoconazole (NIZORAL) 2 % cream Apply 1 application topically 2 (two) times daily.  Marland Kitchen MYRBETRIQ 50 MG TB24 tablet Take 50 mg by mouth daily.  . Omega 3-6-9 Fatty Acids (OMEGA 3-6-9 PO) Take by mouth.  Marland Kitchen rOPINIRole (REQUIP) 2 MG tablet Take 2 tablets in the AM, 1 in the afternoon and 1 in the evening  . sildenafil (VIAGRA) 50 MG tablet Take 1 tablet (50 mg total) by mouth daily as needed for erectile dysfunction.  . TURMERIC CURCUMIN PO Take by mouth.   No facility-administered encounter medications on file as of 06/01/2016.     PAST MEDICAL HISTORY:   Past Medical History:  Diagnosis Date  .  OSA (obstructive sleep  apnea) 01/17/2011   npsg 2012:  AHI 67/hr. Auto titration 2012:  Optimal pressure 12cm.   . APPENDECTOMY, HX OF 02/18/2008   Qualifier: Diagnosis of  By: [place], [place]    . Cardiac murmur    as a child  . Depression   . Headache(784.0)   . HEADACHES, HX OF 02/18/2008   Qualifier: Diagnosis of  By: Danny Lawless CMA, [place]    . Parkinson disease ([place]) 11/2014  . Streptococcal meningitis    as an infant    PAST SURGICAL HISTORY:   Past Surgical History:  Procedure Laterality Date  . APPENDECTOMY  1967  . NASAL SINUS SURGERY     x 4 as a child  . VASECTOMY      SOCIAL HISTORY:   Social History   Social  History  . Marital status: Married    Spouse name: N/A  . Number of children: Y  . Years of education: N/A   Occupational History  . 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 Smoking status: Never Smoker  . Smokeless tobacco: Never Used  . Alcohol use No  . Drug use: No  . Sexual activity: Yes    Birth control/ protection: None   Other Topics Concern  . Not on file   Social History Narrative  . No narrative on file    FAMILY HISTORY:   Family Status  Relation Status  . Father Deceased   lung cancer, alzheimer's  . Mother Alive   HTN, A fib  . Brother Deceased   accident  . Sister Alive   healthy  . Son Alive   healthy  . Daughter Alive   healthy  . Daughter Alive   healthy  . Maternal Aunt Alive   Parkinson's Disease    ROS:  A complete 10 system review of systems was obtained and was unremarkable apart from what is mentioned above.  PHYSICAL EXAMINATION:    VITALS:   Vitals:   06/01/16 0847  BP: 132/84  Pulse: 72  Weight: 282 lb (127.9 kg)  Height: 6\' 2"  (1.88 m)   Wt Readings from Last 3 Encounters:  06/01/16 282 lb (127.9 kg)  05/11/16 280 lb (127 kg)  03/29/16 287 lb (130.2 kg)     GEN:  The patient appears stated age and is in NAD. HEENT:  Normocephalic, atraumatic.  The mucous membranes are moist.  The superficial temporal arteries are without ropiness or tenderness. CV:  RRR Lungs:  CTAB Neck/HEME:  There are no carotid bruits bilaterally.  Neurological examination:  Orientation: The patient is alert and oriented x3.  Cranial nerves: There is good facial symmetry. 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  Pupils are equal round and reactive to light bilaterally.  The speech is fluent and clear.  He is hypophonic.  The patient is able to make the gutteral sounds without difficulty. Soft palate rises symmetrically and there is no tongue deviation. Hearing is intact to conversational tone. Sensation: Sensation is intact to light touch throughout Motor: Strength is 5/5 in the bilateral upper and lower extremities.   Shoulder shrug is equal and symmetric.  There is no pronator drift.   Movement examination: Tone: There is normal tone bilaterally Abnormal movements: none today Coordination:  There is decremation with RAM's, seen with hand opening and closing on the left, finger taps on the left, alternation of supination/pronation on the left, heel taps and toe taps on the left. Gait and Station: The patient has no difficulty arising out of a deep-seated chair without the use of the hands.   The patient's stride length is decreased with decreased arm swing bilaterally (mild).    His pull test was negative today.  ASSESSMENT/PLAN:  1.  Parkinsonism  -While he certainly could have idiopathic PD, especially since he was only on the [place] for 6-8 weeks and sx's preceded that, he was exposed to multiple other antipsychotics and I am unsure of the timeline of those exposures.  He has been off of [place] since early February, and his examination had improved when he got off of Latuda, even before starting on Requip. Continue requip 4mg /2mg /2mg .  Thinks tremor is improved.  -Move dosages of requip closer together  -he is now in ACT classes with scholarship program  -  filled out further disability  forms today 2  Depression  -The patient reports that this has been stable.  He is not suicidal or homicidal.  He will remain on Celexa and Wellbutrin and remain under the care of psychiatry.  -has appt with psychiatry in one week 3.  OSAS  - Had study on 05/11/16 but results not yet available.  Will call sleep center and talk with them about that. 4.  Sialorrhea  -refuses botox but has asked me multiple times about what to do about this.  Will let me know if changes mind 5.  Dizziness  -drinking plenty of fluid  -refuses compression stocking/refuses abdominal compression binder  -told him that requip could contribute.    -needs to get up slowly 6.  Memory loss  -He had repeat neuropsych testing on August 2 with a follow-up with Dr. Si Raider on August 10.  This was largely consistent with his performance one year prior.  There is mild cognitive impairment consistent with Parkinson's disease and untreated obstructive sleep apnea syndrome.  There was evidence of major depressive disorder, and Dr. Si Raider felt that there was long-standing personality issues that influenced the patient's view of the world in a negative fashion. 7.  Urinary incontinence, frequency and nocturia  -following with [facility] and on myrbetriq 8.  Follow-up with me will be in the next 4 months, sooner should new neurologic issues arise.      Much greater than 50% of this visit was spent in counseling with the patient and the family.  Total face to face time:  45 min.

## 2016-06-01 ENCOUNTER — Ambulatory Visit (INDEPENDENT_AMBULATORY_CARE_PROVIDER_SITE_OTHER): Payer: Federal, State, Local not specified - PPO | Admitting: Neurology

## 2016-06-01 ENCOUNTER — Encounter: Payer: Self-pay | Admitting: Neurology

## 2016-06-01 VITALS — BP 132/84 | HR 72 | Ht 74.0 in | Wt 282.0 lb

## 2016-06-01 DIAGNOSIS — R413 Other amnesia: Secondary | ICD-10-CM | POA: Diagnosis not present

## 2016-06-01 DIAGNOSIS — G4733 Obstructive sleep apnea (adult) (pediatric): Secondary | ICD-10-CM

## 2016-06-01 DIAGNOSIS — G3184 Mild cognitive impairment, so stated: Secondary | ICD-10-CM | POA: Diagnosis not present

## 2016-06-01 DIAGNOSIS — F331 Major depressive disorder, recurrent, moderate: Secondary | ICD-10-CM

## 2016-06-01 DIAGNOSIS — R32 Unspecified urinary incontinence: Secondary | ICD-10-CM

## 2016-06-01 DIAGNOSIS — G20 Parkinson's disease: Secondary | ICD-10-CM

## 2016-06-01 DIAGNOSIS — K117 Disturbances of salivary secretion: Secondary | ICD-10-CM

## 2016-06-08 ENCOUNTER — Ambulatory Visit (HOSPITAL_COMMUNITY): Payer: Self-pay | Admitting: Psychiatry

## 2016-07-03 ENCOUNTER — Other Ambulatory Visit (HOSPITAL_COMMUNITY): Payer: Self-pay | Admitting: Psychiatry

## 2016-07-03 DIAGNOSIS — F332 Major depressive disorder, recurrent severe without psychotic features: Secondary | ICD-10-CM

## 2016-07-14 ENCOUNTER — Telehealth: Payer: Self-pay | Admitting: Neurology

## 2016-07-14 DIAGNOSIS — G20 Parkinson's disease: Secondary | ICD-10-CM

## 2016-07-14 NOTE — Telephone Encounter (Signed)
Order entered

## 2016-07-14 NOTE — Telephone Encounter (Signed)
-----   Message from Hulda Marin, OT sent at 07/14/2016 11:26 AM EDT ----- Dr. Carles Collet,  At the time of his previous discharge from therapy, Arvella Nigh, agreed that he would like to return  PT,OT, ST evals in 6 mons due to the progressive nature of PD. These evals are scheduled for 08/10/16. If you agree please send orders for PT,OT, ST Thanks, [employer], OTR/L

## 2016-07-28 ENCOUNTER — Ambulatory Visit (INDEPENDENT_AMBULATORY_CARE_PROVIDER_SITE_OTHER): Payer: Federal, State, Local not specified - PPO | Admitting: Psychiatry

## 2016-07-28 ENCOUNTER — Encounter (HOSPITAL_COMMUNITY): Payer: Self-pay | Admitting: Psychiatry

## 2016-07-28 DIAGNOSIS — F332 Major depressive disorder, recurrent severe without psychotic features: Secondary | ICD-10-CM | POA: Diagnosis not present

## 2016-07-28 DIAGNOSIS — Z79899 Other long term (current) drug therapy: Secondary | ICD-10-CM

## 2016-07-28 MED ORDER — CITALOPRAM HYDROBROMIDE 40 MG PO TABS: 40.0000 mg | ORAL_TABLET | Freq: Every day | ORAL | 0 refills | Status: DC

## 2016-07-28 MED ORDER — BUPROPION HCL ER (XL) 300 MG PO TB24: 300.0000 mg | ORAL_TABLET | Freq: Every morning | ORAL | 0 refills | Status: DC

## 2016-07-28 NOTE — Progress Notes (Signed)
[name] [name] Progress Note  [name] [name] [id number] 65 y.o.  07/28/2016 2:14 PM  Chief Complaint:  I got retirement in July.  I have not starting getting pension yet.  My depression is much better but I'm very concerned about my memory.               History of Present Illness:  [name] [name] came for his follow-up appointment with his wife.  He is now retired but he is complaining of financial issues because has not started pension yet.  He saw his neurologist in June and also he had neuropsych testing which shows dementia due to [place].  Patient is very concerned about is memory issues.  He is forgetful and he has poor attention and concentration.  He is also complaining of balance issue and he has difficulty walking and he was disappointed because his neurologist did not increase the dose or adding any medicine for Parkinson.  He has a blood work in June which shows cholesterol 206 , hemoglobin A1c is 6.0 , B12 T4 and CBC normal.  Patient and his wife also concerned about the side effects of levodopa as they were told these medicines are far more progressive Parkinson condition.  Patient and her husband worried that slowly and gradually he is going deep into Parkinson symptoms.  He started going to gym 2 times a week.  He feels Celexa and Wellbutrin helping his depression as he has no longer feeling any irritability, anger, frustration, sadness or any feeling of hopelessness.  He denies any active or passive suicidal thoughts.  His appetite is fair.  He denies any paranoia or any hallucination.  He is sleeping fairly well.  His tremors are chronic and stable.  Patient denies drinking alcohol or using any illegal substances.  His vital signs are stable.  Patient is planning to visit his mother in few weeks who lives in [place].  Once here time he will start more speech and physical therapy.  Suicidal Ideation: No Plan Formed: No Patient has means to carry out plan: No  Homicidal  Ideation: No Plan Formed: No Patient has means to carry out plan: No  Review of Systems  Constitutional: Negative for weight loss.  HENT: Negative.   Cardiovascular: Negative for chest pain and palpitations.  Musculoskeletal: Negative for back pain and myalgias.  Skin: Negative for itching and rash.  Neurological: Positive for tremors. Negative for dizziness and headaches.  Psychiatric/Behavioral: Negative for depression, hallucinations, substance abuse and suicidal ideas. The patient does not have insomnia.    Psychiatric: Agitation: No Hallucination: No Depressed Mood: No Insomnia: No Hypersomnia: No Altered Concentration: No Feels Worthless: No Grandiose Ideas: No Belief In Special Powers: No New/Increased Substance Abuse: No Compulsions: No  Neurologic: Headache: No Seizure: No Paresthesias: No  Past Medical History: Hypogonadism, Parkinson  Social history. Patient is employed and married.  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  Outpatient Encounter Prescriptions as of 07/28/2016  Medication Sig Dispense Refill  . B Complex-C (B-COMPLEX WITH VITAMIN C) tablet Take 1 tablet by mouth daily.    Marland Kitchen buPROPion (WELLBUTRIN XL) 300 MG 24 hr tablet TAKE 1 TABLET (300 MG TOTAL) BY MOUTH EVERY MORNING. 90 tablet 0  . citalopram (CELEXA) 40 MG tablet TAKE 1 TABLET (40 MG TOTAL) BY MOUTH DAILY. 90 tablet 0  . ketoconazole (NIZORAL) 2 % cream Apply 1 application topically 2 (two) times daily. 60 g 0  . MYRBETRIQ 50 MG  TB24 tablet Take 50 mg by mouth daily.  11  . Omega 3-6-9 Fatty Acids (OMEGA 3-6-9 PO) Take by mouth.    Marland Kitchen rOPINIRole (REQUIP) 2 MG tablet Take 2 tablets in the AM, 1 in the afternoon and 1 in the evening 360 tablet 1  . sildenafil (VIAGRA) 50 MG tablet Take 1 tablet (50 mg total) by mouth daily as needed for erectile dysfunction. 20 tablet 3  . TURMERIC CURCUMIN PO Take by mouth.     No facility-administered encounter medications on file as of  07/28/2016.     Past Psychiatric History/Hospitalization(s): Patient denies any history of suicidal attempt or any inpatient psychiatric treatment.  He denies any history of mania, psychosis, paranoia or any hallucination.  In the past he had tried Effexor, Abilify, Paxil, Cymbalta, Zoloft, amitriptyline, Pristiq, Britnellex, Celexa, Deplin and Vyvanse.  He is seen in this office since September 2009.  He has seen multiple psychiatrists in the past.  Anxiety: No Bipolar Disorder: No Depression: Yes Mania: No Psychosis: No Schizophrenia: No Personality Disorder: No Hospitalization for psychiatric illness: No History of Electroconvulsive Shock Therapy: No Prior Suicide Attempts: No  Physical Exam: Constitutional:  There were no vitals taken for this visit.  No results found for this or any previous visit (from the past 2160 hour(s)).  General Appearance: alert, oriented, no acute distress  Musculoskeletal: Strength & Muscle Tone: decreased Gait & Station: normal Patient leans: N/A   Mental Status Examination: Patient is casually dressed and fairly groomed.  He maintained fair eye contact.  His speech is slow with decreased volume and tone.  His response to the question is slow.  His thought process is slow but coherent.  His affect is flat.  He denies any auditory or visual hallucination.  He denies any active or passive suicidal thoughts and homicidal thoughts.  There were no delusions or any paranoia.  His attention and concentration is fair. He has some difficulty remembering things but he is alert and oriented 3.  His psychomotor activity is decreased.  His fund of knowledge is average.  His insight judgment and impulse control is okay.  Established Problem, Stable/Improving (1), Review or order clinical lab tests (1), Review and summation of old records (2), New Problem, with no additional work-up planned (3), Review of Last Therapy Session (1) and Review of Medication Regimen &  Side Effects (2)  Assessment: Axis I: Maj. depressive disorder, recurrent, severe.  Mild cognitive impairment.   Axis II: Deferred  Axis III: Hypogonadism, Parkinson  Plan:  I reviewed records from his primary care physician, neurology and also reviewed his neuropsych testing and blood work results.  Patient is very concerned about his memory decline.  I do believe he may get benefit to see a psychologist for cognitive therapy.  We will refer him to see his psychologist also did neuropsych testing.  His depression is stable on his current medication.  He has no side effects .  Continue Celexa 40 mg daily and Wellbutrin XL 300 mg daily.  Discussed associated symptoms related to Parkinson which includes dementia, decreased attention span and neurovegetative symptoms. Recommended to call us back if he has any question or any concern. Will see him again in 4 months.  Discuss safety plan that anytime having active suicidal thoughts or homicidal thought any need to call 911 or go to the local emergency room.   ARFEEN,SYED T., MD 07/28/2016  Patient ID: [name], male   DOB: 03/28/1951, 65 y.o.   MRN: [id number]

## 2016-08-09 ENCOUNTER — Encounter: Payer: Self-pay | Admitting: Occupational Therapy

## 2016-08-09 ENCOUNTER — Ambulatory Visit: Payer: Self-pay | Admitting: Physical Therapy

## 2016-08-10 ENCOUNTER — Ambulatory Visit: Payer: Federal, State, Local not specified - PPO | Admitting: Occupational Therapy

## 2016-08-10 ENCOUNTER — Ambulatory Visit: Payer: Federal, State, Local not specified - PPO | Attending: Neurology | Admitting: Physical Therapy

## 2016-08-10 ENCOUNTER — Ambulatory Visit: Payer: Federal, State, Local not specified - PPO

## 2016-08-10 DIAGNOSIS — R29898 Other symptoms and signs involving the musculoskeletal system: Secondary | ICD-10-CM

## 2016-08-10 DIAGNOSIS — R2681 Unsteadiness on feet: Secondary | ICD-10-CM | POA: Diagnosis present

## 2016-08-10 DIAGNOSIS — R278 Other lack of coordination: Secondary | ICD-10-CM

## 2016-08-10 DIAGNOSIS — R2689 Other abnormalities of gait and mobility: Secondary | ICD-10-CM | POA: Insufficient documentation

## 2016-08-10 DIAGNOSIS — R471 Dysarthria and anarthria: Secondary | ICD-10-CM | POA: Diagnosis present

## 2016-08-10 DIAGNOSIS — R259 Unspecified abnormal involuntary movements: Secondary | ICD-10-CM | POA: Insufficient documentation

## 2016-08-10 DIAGNOSIS — R29818 Other symptoms and signs involving the nervous system: Secondary | ICD-10-CM | POA: Insufficient documentation

## 2016-08-10 DIAGNOSIS — R41840 Attention and concentration deficit: Secondary | ICD-10-CM | POA: Insufficient documentation

## 2016-08-10 DIAGNOSIS — R293 Abnormal posture: Secondary | ICD-10-CM

## 2016-08-10 DIAGNOSIS — R269 Unspecified abnormalities of gait and mobility: Secondary | ICD-10-CM | POA: Insufficient documentation

## 2016-08-10 DIAGNOSIS — R4701 Aphasia: Secondary | ICD-10-CM | POA: Insufficient documentation

## 2016-08-10 DIAGNOSIS — R41841 Cognitive communication deficit: Secondary | ICD-10-CM | POA: Diagnosis present

## 2016-08-10 NOTE — Therapy (Signed)
[facility] [address] [place], [place], [address] Phone: [phone]   Fax:  [phone]  Physical Therapy Evaluation  Patient Details  Name: [name] [name] MRN: [id number] Date of Birth: Aug 03, 1951 Referring Provider: Tat  Encounter Date: 08/10/2016      PT End of Session - 08/10/16 1233    Visit Number 1   Number of Visits 17   Date for PT Re-Evaluation 10/09/16   Authorization Type [employer]-75 visit limit (14 visits already used from PT ending 02/2016)-GCODE   PT Start Time 1018   PT Stop Time 1101   PT Time Calculation (min) 43 min   Activity Tolerance Patient tolerated treatment well   Behavior During Therapy [facility] for tasks assessed/performed      Past Medical History:  Diagnosis Date  .  OSA (obstructive sleep apnea) 01/17/2011   npsg 2012:  AHI 67/hr. Auto titration 2012:  Optimal pressure 12cm.   . APPENDECTOMY, HX OF 02/18/2008   Qualifier: Diagnosis of  By: [place], [place]    . Cardiac murmur    as a child  . Depression   . Headache(784.0)   . HEADACHES, HX OF 02/18/2008   Qualifier: Diagnosis of  By: Danny Lawless CMA, [place]    . Parkinson disease ([place]) 11/2014  . Streptococcal meningitis    as an infant    Past Surgical History:  Procedure Laterality Date  . APPENDECTOMY  1967  . NASAL SINUS SURGERY     x 4 as a child  . VASECTOMY      There were no vitals filed for this visit.       Subjective Assessment - 08/10/16 1021    Subjective Pt retired in July from his work.  He reports having increased stiffness upon standing after long periods.  Working out two times a week at [employer].  Reports having trouble/decreased confidence on stairs.  No reported falls.   Patient Stated Goals Pt's goals for therapy are to improve sense of balance and stair negotiation.   Currently in Pain? No/denies            [facility] PT Assessment - 08/10/16 1024      Assessment   Medical Diagnosis  Parkinson's disease   Referring Provider Tat     Precautions   Precautions Fall     Balance Screen   Has the patient fallen in the past 6 months No   Has the patient had a decrease in activity level because of a fear of falling?  No   Is the patient reluctant to leave their home because of a fear of falling?  No     Home Environment   Living Environment Private residence   Living Arrangements Spouse/significant other   Available Help at Discharge Family   Type of [place] to enter   Entrance Stairs-Number of Steps 5   Entrance Stairs-Rails Right   Home Layout Two level;Bed/bath upstairs   Alternate Level Stairs-Rails Right   Additional Comments Pt describes difficulty with ascending steps while carrrying things-takes extra effort     Prior Function   Level of Independence Independent with basic ADLs;Independent with household mobility without device;Independent with community mobility without device  sometimes uses cane   Vocation Retired   Leisure goes to [employer] several times a week  stretching, strengthening activities     Observation/Other Assessments   Focus on Therapeutic Outcomes (FOTO)  NA     Posture/Postural  Control   Posture/Postural Control Postural limitations   Postural Limitations Rounded Shoulders;Forward head   Posture Comments L foot in increased internal rotation, supination, noted with gait      ROM / Strength   AROM / PROM / Strength Strength     Strength   Overall Strength Comments Grossly tested at least 4/5 bilateral lower extremities     Transfers   Transfers Sit to Stand;Stand to Sit   Sit to Stand 6: Modified independent (Device/Increase time);From chair/3-in-1;Without upper extremity assist   Five time sit to stand comments  11.43   Stand to Sit 6: Modified independent (Device/Increase time);Without upper extremity assist;To chair/3-in-1     Ambulation/Gait   Ambulation/Gait Yes   Ambulation/Gait Assistance 7:  Independent   Ambulation Distance (Feet) 250 Feet   Assistive device None   Gait Pattern Step-through pattern;Decreased arm swing - right;Decreased arm swing - left;Decreased step length - right;Decreased step length - left;Decreased trunk rotation;Trunk flexed;Poor foot clearance - left;Poor foot clearance - right   Ambulation Surface Level;Indoor   Gait velocity 13.98 sec =2.35 ft/sec   Stairs Yes  See notes below   Pre-Gait Activities Pt ascends/descends 4 steps with 1 handrail, alternating step pattern, modified independently.  While carrying box in bilateral hands (<5 lbs), pt ascends/descends 4 steps with step-to pattern, with supervision/min guard assistance, with hesitation/festination noted.   Gait Comments Pt reports difficulty on outdoor surfaces, hills, gravel at times needs to use cane.     Standardized Balance Assessment   Standardized Balance Assessment Timed Up and Go Test     Timed Up and Go Test   Normal TUG (seconds) 15.41   Manual TUG (seconds) 15.68   Cognitive TUG (seconds) 17.9   TUG Comments Scores >13.5 sec-15 seconds indicates increased fall risk.     High Level Balance   High Level Balance Comments MiniBESTest Score:  21/28 (difficulty iwth single limt stance on LLE, rising up on toes, gait activities-see full note)        Mini-BESTest: Balance Evaluation Systems Test  2005-2013 [employer]. All rights reserved. ________________________________________________________________________________________Anticipatory_________Subscore__4___/6 1. SIT TO STAND Instruction: "Cross your arms across your chest. Try not to use your hands unless you must.Do not let your legs lean against the back of the chair when you stand. Please stand up now." X(2) Normal: Comes to stand without use of hands and stabilizes independently. (1) Moderate: Comes to stand WITH use of hands on first attempt. (0) Severe: Unable to stand up from chair without  assistance, OR needs several attempts with use of hands. 2. RISE TO TOES Instruction: "Place your feet shoulder width apart. Place your hands on your hips. Try to rise as high as you can onto your toes. I will count out loud to 3 seconds. Try to hold this pose for at least 3 seconds. Look straight ahead. Rise now." (2) Normal: Stable for 3 s with maximum height. X(1) Moderate: Heels up, but not full range (smaller than when holding hands), OR noticeable instability for 3 s. (0) Severe: < 3 s. 3. STAND ON ONE LEG Instruction: "Look straight ahead. Keep your hands on your hips. Lift your leg off of the ground behind you without touching or resting your raised leg upon your other standing leg. Stay standing on one leg as long as you can. Look straight ahead. Lift now." Left: Time in Seconds Trial 1:__1.09 sec___Trial 2:_0.77 sec____ (2) Normal: 20 s. X(1) Moderate: < 20 s. (0) Severe: Unable.  Right: Time in Seconds Trial 1:_5.13 sec____Trial 2:__7.7 sec___ (2) Normal: 20 s. X(1) Moderate: < 20 s. (0) Severe: Unable To score each side separately use the trial with the longest time. To calculate the sub-score and total score use the side [left or right] with the lowest numerical score [i.e. the worse side]. ______________________________________________________________________________________Reactive Postural Control___________Subscore:__6___/6 4. COMPENSATORY STEPPING CORRECTION- FORWARD Instruction: "Stand with your feet shoulder width apart, arms at your sides. Lean forward against my hands beyond your forward limits. When I let go, do whatever is necessary, including taking a step, to avoid a fall." X(2) Normal: Recovers independently with a single, large step (second realignment step is allowed). (1) Moderate: More than one step used to recover equilibrium. (0) Severe: No step, OR would fall if not caught, OR falls spontaneously. 5. COMPENSATORY STEPPING CORRECTION- BACKWARD Instruction:  "Stand with your feet shoulder width apart, arms at your sides. Lean backward against my hands beyond your backward limits. When I let go, do whatever is necessary, including taking a step, to avoid a fall." X(2) Normal: Recovers independently with a single, large step. (1) Moderate: More than one step used to recover equilibrium. (0) Severe: No step, OR would fall if not caught, OR falls spontaneously. 6. COMPENSATORY STEPPING CORRECTION- LATERAL Instruction: "Stand with your feet together, arms down at your sides. Lean into my hand beyond your sideways limit. When I let go, do whatever is necessary, including taking a step, to avoid a fall." Left X(2) Normal: Recovers independently with 1 step (crossover or lateral OK). (1) Moderate: Several steps to recover equilibrium. (0) Severe: Falls, or cannot step. Right X(2) Normal: Recovers independently with 1 step (crossover or lateral OK). (1) Moderate: Several steps to recover equilibrium. (0) Severe: Falls, or cannot step. Use the side with the lowest score to calculate sub-score and total score. ____________________________________________________________________________________Sensory Orientation_____________Subscore:_____6____/6 7. STANCE (FEET TOGETHER); EYES OPEN, FIRM SURFACE Instruction: "Place your hands on your hips. Place your feet together until almost touching. Look straight ahead. Be as stable and still as possible, until I say stop." Time in seconds:________ X(2) Normal: 30 s. (1) Moderate: < 30 s. (0) Severe: Unable. 8. STANCE (FEET TOGETHER); EYES CLOSED, FOAM SURFACE Instruction: "Step onto the foam. Place your hands on your hips. Place your feet together until almost touching. Be as stable and still as possible, until I say stop. I will start timing when you close your eyes." Time in seconds:________ X(2) Normal: 30 s. (1) Moderate: < 30 s. (0) Severe: Unable. 9. INCLINE- EYES CLOSED Instruction: "Step onto the  incline ramp. Please stand on the incline ramp with your toes toward the top. Place your feet shoulder width apart and have your arms down at your sides. I will start timing when you close your eyes." Time in seconds:________ X(2) Normal: Stands independently 30 s and aligns with gravity. (1) Moderate: Stands independently <30 s OR aligns with surface. (0) Severe: Unable. _________________________________________________________________________________________Dynamic Gait ______Subscore_____5___/10 10. CHANGE IN GAIT SPEED Instruction: "Begin walking at your normal speed, when I tell you 'fast', walk as fast as you can. When I say 'slow', walk very slowly." (2) Normal: Significantly changes walking speed without imbalance. X(1) Moderate: Unable to change walking speed or signs of imbalance. (0) Severe: Unable to achieve significant change in walking speed AND signs of imbalance. [place] - HORIZONTAL Instruction: "Begin walking at your normal speed, when I say "right", turn your head and look to the right. When I say "left" turn your head  and look to the left. Try to keep yourself walking in a straight line." (2) Normal: performs head turns with no change in gait speed and good balance. X(1) Moderate: performs head turns with reduction in gait speed. (0) Severe: performs head turns with imbalance. 12. WALK WITH PIVOT TURNS Instruction: "Begin walking at your normal speed. When I tell you to 'turn and stop', turn as quickly as you can, face the opposite direction, and stop. After the turn, your feet should be close together." (2) Normal: Turns with feet close FAST (< 3 steps) with good balance. X(1) Moderate: Turns with feet close SLOW (>4 steps) with good balance. (0) Severe: Cannot turn with feet close at any speed without imbalance. 13. STEP OVER OBSTACLES Instruction: "Begin walking at your normal speed. When you get to the box, step over it, not around it and keep  walking." (2) Normal: Able to step over box with minimal change of gait speed and with good balance. X(1) Moderate: Steps over box but touches box OR displays cautious behavior by slowing gait. (0) Severe: Unable to step over box OR steps around box. 14. TIMED UP & GO WITH DUAL TASK [3 METER WALK] Instruction TUG: "When I say 'Go', stand up from chair, walk at your normal speed across the tape on the floor, turn around, and come back to sit in the chair." Instruction TUG with Dual Task: "Count backwards by threes starting at ___. When I say 'Go', stand up from chair, walk at your normal speed across the tape on the floor, turn around, and come back to sit in the chair. Continue counting backwards the entire time." TUG: ___15.41_____seconds; Dual Task TUG: _____17.9___seconds (2) Normal: No noticeable change in sitting, standing or walking while backward counting when compared to TUG without Dual Task. X(1) Moderate: Dual Task affects either counting OR walking (>10%) when compared to the TUG without Dual Task. (0) Severe: Stops counting while walking OR stops walking while counting. When scoring item 14, if subject's gait speed slows more than 10% between the TUG without and with a Dual Task the score should be decreased by a point. TOTAL SCORE: ___21_____/28                      PT Short Term Goals - 08/10/16 1239      PT SHORT TERM GOAL #1   Title Pt will be independent with HEP for balance, functional strengthening and gait.  TARGET 09/08/16   Time 4   Period Weeks   Status New     PT SHORT TERM GOAL #2   Title Pt will improve TUG score to less than or equal to 13.5 seconds for decreased fall risk.   Time 4   Period Weeks   Status New     PT SHORT TERM GOAL #3   Title Pt will improve single limb stance to at least 3 seconds on LLE for improved stair negotiation and obstacle negotiation.   Time 4   Period Weeks   Status New     PT SHORT TERM GOAL #4   Title  Pt will negotiate 4 steps,carrying objects resembling boxes or laundry that he would carry at home, step to pattern, with supervision.   Time 4   Period Weeks   Status New           PT Long Term Goals - 08/10/16 1244      PT LONG TERM GOAL #1   Title Pt will  verbalize understanding of fall prevention/tips to reduce freezing with gait.  TARGET 10/09/16   Time 8   Period Weeks   Status New     PT LONG TERM GOAL #2   Title Pt will improve TUG cognitive to less than or equal to 15 seconds for decreased fall risk.   Time 8   Period Weeks   Status New     PT LONG TERM GOAL #3   Title Pt will improve MiniBESTest to at least 24/28 for decreased fall risk.   Time 8   Period Weeks   Status New     PT LONG TERM GOAL #4   Title Pt will negotiate at least 4 steps carrying objects simulating household items, step to pattern, modified independently no LOB.   Time 8   Period Weeks     PT LONG TERM GOAL #5   Title Pt will improved gait velocity to at least 2.62 ft/sec for improved gait efficiency and safety.   Time 8   Period Weeks   Status New               Plan - 08/30/16 1234    Clinical Impression Statement Pt is a 65 year old male with history of Parkinson's disease, returning for PT evaluation following discharge from previous bout of PT 02/2016.  Pt has been working out at [employer]  and has slightly improved on 5xsit<>stand test.  Gait velocity appears slowed, with difficulty with single limb stance and stair negotiation while carrying objects.  Pt is at fall risk per MiniBESTest and TUG scores.  Pt presents with decreased timing/coordination of gait, bradykinesia, postural abnormality, decreased balance.  Pt would benefit from further skilled PT to address the above stated deficits in order to optimize functional mobility for participation in community and exercise activities and decrease fall risk.   Rehab Potential Good   PT Frequency 2x / week   PT Duration 8 weeks  plus  eval   PT Treatment/Interventions ADLs/Self Care Home Management;Functional mobility training;Gait training;Stair training;Therapeutic activities;Therapeutic exercise;Balance training;Neuromuscular re-education;Patient/family education   PT Next Visit Plan Review sitting and standing [employer] Moves; work on [employer] additions to HEP-forward step ups, forward step downs; work on [employer]product [employer] with and without carrying objects   Consulted and Agree with Plan of Care Patient      Patient will benefit from skilled therapeutic intervention in order to improve the following deficits and impairments:  Abnormal gait, Decreased balance, Decreased mobility, Difficulty walking, Decreased strength, Postural dysfunction  Visit Diagnosis: Other abnormalities of gait and mobility  Abnormal posture  Other symptoms and signs involving the nervous system  Unsteadiness on feet      G-Codes - 08/30/16 1247    Functional Assessment Tool Used gait velocity 2.35 ft/sec, Tug 15.41 sec, TUG cog 17.9, TUG manual 15.68; stairs carrying items step to pattern min guard/supervision iwth festination, MiniBESTest 21/28   Functional Limitation Mobility: Walking and moving around   Mobility: Walking and Moving Around Current Status [id number]) At least 40 percent but less than 60 percent impaired, limited or restricted   Mobility: Walking and Moving Around Goal Status [id number]) At least 20 percent but less than 40 percent impaired, limited or restricted       Problem List Patient Active Problem List   Diagnosis Date Noted  . Fatigue 03/15/2016  . Parkinsonian features 09/15/2015  . ED (erectile dysfunction) 09/15/2015  . MDD (major depressive disorder), recurrent episode, severe ([place]) 02/10/2014  . Nonspecific abnormal  electrocardiogram (ECG) (EKG) 02/07/2014  . Non-compliant behavior 02/03/2014  . Morton's neuroma of left foot 08/07/2013  . 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  Hyperglycemia 01/26/2012  . Hypogonadism male 01/24/2012  . Syncope and collapse 01/20/2012  .  OSA (obstructive sleep apnea) 01/17/2011  . CHEST TIGHTNESS 02/18/2008    Corine Solorio W. 08/10/2016, 12:48 PM Frazier Butt., PT [facility] [address] [place], [place], [address] Phone: [phone]   Fax:  [phone]  Name: [name] [name] MRN: [id number] Date of Birth: 1951-03-07

## 2016-08-10 NOTE — Patient Instructions (Signed)
5 loud "ah" twice a day. Use tongue depressor to exercise lips - put it in on rt side and GENTLY pull - keep your lips pressed TIGHTLY - 20x each side, twice a day

## 2016-08-10 NOTE — Therapy (Signed)
[facility] [address] [place], [place], [address] Phone: [phone]   Fax:  [phone]  Occupational Therapy Treatment  Patient Details  Name: [name] [name] MRN: [id number] Date of Birth: May 26, 1951 Referring Provider: Dr. Carles Collet  Encounter Date: 08/10/2016      OT End of Session - 08/10/16 1035    Visit Number 1   Number of Visits 13   Date for OT Re-Evaluation 09/23/16   Authorization Type BCBS   Authorization Time Period 75 visit limit combined need to check to see how many visits used   OT Start Time 0935   OT Stop Time 1015   OT Time Calculation (min) 40 min   Activity Tolerance Patient tolerated treatment well   Behavior During Therapy [facility] for tasks assessed/performed      Past Medical History:  Diagnosis Date  .  OSA (obstructive sleep apnea) 01/17/2011   npsg 2012:  AHI 67/hr. Auto titration 2012:  Optimal pressure 12cm.   . APPENDECTOMY, HX OF 02/18/2008   Qualifier: Diagnosis of  By: [place], [place]    . Cardiac murmur    as a child  . Depression   . Headache(784.0)   . HEADACHES, HX OF 02/18/2008   Qualifier: Diagnosis of  By: Danny Lawless CMA, [place]    . Parkinson disease ([place]) 11/2014  . Streptococcal meningitis    as an infant    Past Surgical History:  Procedure Laterality Date  . APPENDECTOMY  1967  . NASAL SINUS SURGERY     x 4 as a child  . VASECTOMY      There were no vitals filed for this visit.      Subjective Assessment - 08/10/16 1027    Subjective  Pt with PD returns to therapy with balance, rigidity and coordination deficits   Limitations see Epic   Patient Stated Goals improved balance and ease with ADLs   Currently in Pain? No/denies            [facility] OT Assessment - 08/10/16 0940      Assessment   Diagnosis Parkinson's disease   Referring Provider Dr. Carles Collet   Onset Date 07/14/16   Prior Therapy PT, OT, ST     Precautions   Precautions Fall     Balance Screen   Has the patient fallen in the past 6 months No   Has the patient had a decrease in activity level because of a fear of falling?  No   Is the patient reluctant to leave their home because of a fear of falling?  No     Home  Environment   Family/patient expects to be discharged to: Private residence   Type of [place] Two level   Lives With Spouse     Prior Function   Level of Independence Independent with basic ADLs   Vocation Retired   Leisure goes to [employer] several times a week     ADL   Eating/Feeding Modified independent   Grooming Modified independent   Upper Body Bathing Modified independent   Lower Body Bathing Modified independent   Upper Body Dressing Increased time;Independent   Lower Body Dressing Increased time;Modified independent   Tax adviser Independent     IADL   Light Housekeeping Performs light daily tasks such as dishwashing, bed making   Meal Prep Able to complete simple cold meal and snack prep  Medication Management Is responsible for taking medication in correct dosages at correct time   Financial Management --  Pt's wife handles     Mobility   Mobility Status Independent     Written Expression   Dominant Hand Right   Handwriting 100% legible;Mild micrographia     Vision - History   Baseline Vision Wears glasses all the time   Additional Comments No significant visual changes     Cognition   Overall Cognitive Status Impaired/Different from baseline   Memory Impaired   Memory Impairment Decreased short term memory     Observation/Other Assessments   Standing Functional Reach Test RUE 10.5 in, LUE 8 in   Physical Performance Test   Yes   Simulated Eating Time (seconds) 13.22 secs   Donning Doffing Jacket Time (seconds) 18.25 secs     Coordination   9 Hole Peg Test Right;Left   Right 9 Hole Peg Test 24.85 secs   Left 9 Hole Peg Test 44.57 secs   Box  and Blocks RUE 51 blocks, LUE 52 blocks   Tremors bilateral resting and action tremors   Coordination impaired for LUE     Tone   Assessment Location Right Upper Extremity;Left Upper Extremity     ROM / Strength   AROM / PROM / Strength AROM     AROM   Overall AROM  Deficits   Overall AROM Comments RUE shoulder flexion 135, LUE shoulder flexion 140, mildly decreased bilateral supination, pt demonstrates ability to perform full elbow ext, yet he requires cues to extend fully     RUE Tone   RUE Tone Mild  rigidity     LUE Tone   LUE Tone Mild  rigidity                            OT Short Term Goals - 08/10/16 1053      OT SHORT TERM GOAL #1   Title Pt will be independent with PD specific  HEP    Time 3   Period Weeks   Status New     OT SHORT TERM GOAL #2   Title Pt will demonstrate improved fine motor coordination as evidenced by decreasing LUE 9 hole peg test to 40 secs or less.   Baseline RUE 24.85 secs, LUE 44.57 secs   Time 3   Period Weeks   Status New     OT SHORT TERM GOAL #3   Title Pt will verbalize understanding of cognitive compensation strategies/ways to promote cognitive skills prn.   Time 3   Period Weeks   Status New           OT Long Term Goals - 08/10/16 1056      OT LONG TERM GOAL #1   Title Pt will verbalize understanding of adapted strategies for ADLs/IADLs prn    Time 6   Period Weeks   Status New     OT LONG TERM GOAL #2   Title Pt will demonstrate improved functional standing balance as evidenced by increasing LUE standing functional reach to 10 inches or greater   Baseline RUE 10.5 in, LUE 8 in   Time 6   Period Weeks   Status New     OT LONG TERM GOAL #3   Title Pt will demonstrate ability to write a paragraph with 100% legibility and no significant decrease in letter size.   Baseline Pt reports letter size decreases when  he writes a paragraph   Time 6   Period Weeks   Status New     OT LONG TERM GOAL  #4   Title Pt will verbalize understanding of ways to prevent future PD-related complications and verbalize understanding of community resources.   Time 6   Period Weeks   Status New               Plan - 08/10/16 1048    Clinical Impression Statement 65 y.o with Parkinson's disease and PMH for significant depression, sleep apnea. Pt has previously received therapy at this site and pt can benefit from additional skilled occupational therapy due to the progressive nature of PD. Pt presents with the following: cognitive deficits, decreased coordination, rigidity, decreased balance, bradykinesai, tremor. Pt can benefit from skilled occupational therapy to maximize pt's safety and independence with ADLs/ IADLs and to maintain quality of life. Pt retired this summer.   Rehab Potential Good   OT Frequency 2x / week  plus eval   OT Duration 6 weeks   OT Treatment/Interventions Self-care/ADL training;Therapeutic exercise;Cognitive remediation/compensation;Visual/perceptual remediation/compensation;Neuromuscular education;Parrafin;Moist Heat;Fluidtherapy;Energy conservation;Therapeutic exercises;Patient/family education;Balance training;Therapeutic activities;Passive range of motion;Manual Therapy;DME and/or AE instruction;Ultrasound;Cryotherapy   Plan initiate HEP   Consulted and Agree with Plan of Care Patient      Patient will benefit from skilled therapeutic intervention in order to improve the following deficits and impairments:  Decreased cognition, Impaired flexibility, Decreased mobility, Decreased coordination, Decreased endurance, Decreased range of motion, Decreased strength, Impaired UE functional use, Impaired tone, Impaired perceived functional ability, Decreased safety awareness, Difficulty walking, Decreased balance  Visit Diagnosis: Other symptoms and signs involving the nervous system - Plan: Ot plan of care cert/re-cert  Other symptoms and signs involving the musculoskeletal  system - Plan: Ot plan of care cert/re-cert  Abnormal posture - Plan: Ot plan of care cert/re-cert  Other abnormalities of gait and mobility - Plan: Ot plan of care cert/re-cert  Other lack of coordination - Plan: Ot plan of care cert/re-cert  Attention and concentration deficit - Plan: Ot plan of care cert/re-cert    Problem List Patient Active Problem List   Diagnosis Date Noted  . Fatigue 03/15/2016  . Parkinsonian features 09/15/2015  . ED (erectile dysfunction) 09/15/2015  . MDD (major depressive disorder), recurrent episode, severe ([place]) 02/10/2014  . Nonspecific abnormal electrocardiogram (ECG) (EKG) 02/07/2014  . Non-compliant behavior 02/03/2014  . Morton's neuroma of left foot 08/07/2013  . 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 Hyperglycemia 01/26/2012  . Hypogonadism male 01/24/2012  . Syncope and collapse 01/20/2012  .  OSA (obstructive sleep apnea) 01/17/2011  . CHEST TIGHTNESS 02/18/2008    RINE,KATHRYN 08/10/2016, 12:26 PM Theone Murdoch, OTR/L [phone] Phone: [phone] 12:26 PM 08/10/16 [facility] [address] [place], [place], [address] Phone: [phone]   Fax:  [phone]  Name: [name] [name] MRN: [id number] Date of Birth: July 15, 1951

## 2016-08-11 ENCOUNTER — Telehealth: Payer: Self-pay | Admitting: Neurology

## 2016-08-11 DIAGNOSIS — G20 Parkinson's disease: Secondary | ICD-10-CM

## 2016-08-11 NOTE — Telephone Encounter (Signed)
Patient dropped off a form that included needing specific details about limitations. Wife made aware he will need a functional capacity evaluation for this. Given number to [facility] and referral sent.

## 2016-08-11 NOTE — Therapy (Signed)
[facility] [address] [place], [place], [address] Phone: [phone]   Fax:  [phone]  Speech Language Pathology Evaluation  Patient Details  Name: [name] [name] MRN: [id number] Date of Birth: 1951-02-07 Referring Provider: Alonza Bogus, D.O.  Encounter Date: 08/10/2016      End of Session - 08/10/16 1325    Visit Number 1   Number of Visits 17   Date for SLP Re-Evaluation 10/28/16   Authorization Type 75 total visits - pt had 42 visits+3 evals from [facility] 2017   Authorization - Number of Visits --  combined   SLP Start Time 1103   SLP Stop Time  1145   SLP Time Calculation (min) 42 min   Activity Tolerance Patient tolerated treatment well      Past Medical History:  Diagnosis Date  .  OSA (obstructive sleep apnea) 01/17/2011   npsg 2012:  AHI 67/hr. Auto titration 2012:  Optimal pressure 12cm.   . APPENDECTOMY, HX OF 02/18/2008   Qualifier: Diagnosis of  By: [place], [place]    . Cardiac murmur    as a child  . Depression   . Headache(784.0)   . HEADACHES, HX OF 02/18/2008   Qualifier: Diagnosis of  By: Danny Lawless CMA, [place]    . Parkinson disease ([place]) 11/2014  . Streptococcal meningitis    as an infant    Past Surgical History:  Procedure Laterality Date  . APPENDECTOMY  1967  . NASAL SINUS SURGERY     x 4 as a child  . VASECTOMY      There were no vitals filed for this visit.      Subjective Assessment - 08/10/16 1117    Subjective "My family is asking me to repeat more often now, and I say the wrong words sometimes."            SLP Evaluation [facility] - 08/11/16 0001      SLP Visit Information   SLP Received On 08/10/16   Referring Provider Tat, [name], D.O.   Onset Date Diagnosed with PD since 2015   Medical Diagnosis Parkinson's Disease     General Information   HPI Symptoms before January 2015     Prior Functional Status   Cognitive/Linguistic Baseline Baseline  deficits    Lives With Spouse;Family   Vocation Retired     Associate Professor   Overall Cognitive Status History of cognitive impairments - at baseline     Auditory Comprehension   Overall Auditory Comprehension Appears within functional limits for tasks assessed     Verbal Expression   Overall Verbal Expression Impaired  by pt report     Oral Motor/Sensory Function   Overall Oral Motor/Sensory Function Impaired   Labial ROM Within Functional Limits   Labial Strength Reduced Right   Labial Coordination Reduced   Lingual ROM Reduced right;Reduced left   Lingual Symmetry Abnormal symmetry right   Lingual Strength Reduced Left  slight   Lingual Coordination Reduced     Motor Speech   Overall Motor Speech Impaired   Respiration Impaired   Level of Impairment Sentence   Phonation Low vocal intensity;Hoarse   Intelligibility Intelligible   Phonation Impaired   Volume Soft  69dB with hoarseness; 71dB with occasional cues, clear voice   Pitch --  reduced intonational pattern                         SLP Education - 08/10/16  72    Education provided Yes   Education Details HEP for labial strengthening, and for loud /a/, gum for reducing drooling during day   Person(s) Educated Patient   Methods Explanation;Demonstration;Verbal cues;Handout   Comprehension Verbalized understanding;Returned demonstration          SLP Short Term Goals - 08/11/16 1131      SLP SHORT TERM GOAL #1   Title Pt will average loud /a/ of 88dB over 3 sessions   Time 4   Period Weeks   Status New     SLP SHORT TERM GOAL #2   Title Pt will average 70dB during structured speech tasks and simple conversation with rare min A over 3 sessions   Time 4   Period Weeks   Status New     SLP SHORT TERM GOAL #3   Title pt will demo labial HEP with rare min A over three sessions   Time 4   Period Weeks   Status New     SLP SHORT TERM GOAL #4   Title pt will demo compensations for anomia  in 5 minutes simple conversation   Time 4   Period Weeks   Status New          SLP Long Term Goals - 08/11/16 1140      SLP LONG TERM GOAL #1   Title pt to maintain average 70dB in 15 minutes mod complex conversation outside [place] room over two sessions   Time 8   Period Weeks   Status New     SLP LONG TERM GOAL #2   Title pt will demo aphasia compenastions functionally in 8 minutes mod complex conversation over two sessions   Time 8   Period Weeks   Status New     SLP LONG TERM GOAL #3   Title pt will demo appropriate breath support for 8 minutes mod complex conversation    Time 8   Period Weeks   Status New          Plan - 08/10/16 1333    Clinical Impression Statement Pt presents with reduced conversational loudness from dysarthria due to Parkinson's disease, as well as hoarse voice also believed due to Parkinson's disease. Pt well-known to ST from previous therapy courses, and was performing better at discharge in May 2017. Pt would benefit from skilled ST addressing conversational loudness and other areas of language and cogntive-linguistics, as pt has reported dysnomic and working memory problems with verbal expression as well.    Speech Therapy Frequency 2x / week   Duration --  8 weeks   Treatment/Interventions Language facilitation;Internal/external aids;Compensatory techniques;SLP instruction and feedback;Multimodal communcation approach;Cognitive reorganization;Functional tasks;Cueing hierarchy;Patient/family education  any or all may be used   Potential to Achieve Goals Good   Potential Considerations Financial resources  insurance visit limitations   Consulted and Agree with Plan of Care Patient      Patient will benefit from skilled therapeutic intervention in order to improve the following deficits and impairments:   Dysarthria and anarthria  Aphasia    Problem List Patient Active Problem List   Diagnosis Date Noted  . Fatigue 03/15/2016  .  Parkinsonian features 09/15/2015  . ED (erectile dysfunction) 09/15/2015  . MDD (major depressive disorder), recurrent episode, severe ([place]) 02/10/2014  . Nonspecific abnormal electrocardiogram (ECG) (EKG) 02/07/2014  . Non-compliant behavior 02/03/2014  . Morton's neuroma of left foot 08/07/2013  . 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 Hyperglycemia 01/26/2012  .  Hypogonadism male 01/24/2012  . Syncope and collapse 01/20/2012  .  OSA (obstructive sleep apnea) 01/17/2011  . CHEST TIGHTNESS 02/18/2008    Chibuikem Thang ,MS, CCC-SLP  08/11/2016, 11:52 AM  [facility] [address] [place], [place], [address] Phone: [phone]   Fax:  [phone]  Name: [name] [name] MRN: [id number] Date of Birth: [birth year]

## 2016-08-12 ENCOUNTER — Telehealth: Payer: Self-pay | Admitting: Neurology

## 2016-08-12 NOTE — Telephone Encounter (Signed)
[name] [name] 04-24-2051. His wife called in to let you know that Romero needs to have a referral from Dr. Carles Collet sent to [facility]. She also said they had no appointments available until Dec/Jan.  His wife's cell # is [phone]. Thank you

## 2016-08-12 NOTE — Telephone Encounter (Signed)
Wife made aware referral was already placed after our phone call.   She will call back to schedule and advised she ask to be put on a waiting list.

## 2016-08-14 ENCOUNTER — Other Ambulatory Visit: Payer: Self-pay | Admitting: Neurology

## 2016-08-31 ENCOUNTER — Ambulatory Visit: Payer: Federal, State, Local not specified - PPO

## 2016-08-31 ENCOUNTER — Ambulatory Visit: Payer: Federal, State, Local not specified - PPO | Admitting: Occupational Therapy

## 2016-08-31 ENCOUNTER — Ambulatory Visit: Payer: Federal, State, Local not specified - PPO | Admitting: Physical Therapy

## 2016-08-31 ENCOUNTER — Encounter: Payer: Self-pay | Admitting: Physical Therapy

## 2016-08-31 DIAGNOSIS — R293 Abnormal posture: Secondary | ICD-10-CM

## 2016-08-31 DIAGNOSIS — R41841 Cognitive communication deficit: Secondary | ICD-10-CM

## 2016-08-31 DIAGNOSIS — R2689 Other abnormalities of gait and mobility: Secondary | ICD-10-CM | POA: Diagnosis not present

## 2016-08-31 DIAGNOSIS — R259 Unspecified abnormal involuntary movements: Secondary | ICD-10-CM

## 2016-08-31 DIAGNOSIS — R29818 Other symptoms and signs involving the nervous system: Secondary | ICD-10-CM

## 2016-08-31 DIAGNOSIS — R278 Other lack of coordination: Secondary | ICD-10-CM

## 2016-08-31 DIAGNOSIS — R4701 Aphasia: Secondary | ICD-10-CM

## 2016-08-31 DIAGNOSIS — R471 Dysarthria and anarthria: Secondary | ICD-10-CM

## 2016-08-31 DIAGNOSIS — R29898 Other symptoms and signs involving the musculoskeletal system: Secondary | ICD-10-CM

## 2016-08-31 DIAGNOSIS — R41840 Attention and concentration deficit: Secondary | ICD-10-CM

## 2016-08-31 NOTE — Therapy (Signed)
[facility] [address] [place], [place], [address] Phone: [phone]   Fax:  [phone]  Physical Therapy Treatment  Patient Details  Name: [name] [name] MRN: [id number] Date of Birth: 11-24-1950 Referring Provider: Tat  Encounter Date: 08/31/2016      PT End of Session - 08/31/16 1709    Visit Number 2   Number of Visits 17   Date for PT Re-Evaluation 10/09/16   Authorization Type BC[employer]-75 visit limit (14 visits already used from PT ending 02/2016)-GCODE   PT Start Time 1020   PT Stop Time 1100   PT Time Calculation (min) 40 min   Activity Tolerance Patient tolerated treatment well   Behavior During Therapy [facility] for tasks assessed/performed      Past Medical History:  Diagnosis Date  .  OSA (obstructive sleep apnea) 01/17/2011   npsg 2012:  AHI 67/hr. Auto titration 2012:  Optimal pressure 12cm.   . APPENDECTOMY, HX OF 02/18/2008   Qualifier: Diagnosis of  By: [place], [place]    . Cardiac murmur    as a child  . Depression   . Headache(784.0)   . HEADACHES, HX OF 02/18/2008   Qualifier: Diagnosis of  By: Danny Lawless CMA, [place]    . Parkinson disease ([place]) 11/2014  . Streptococcal meningitis    as an infant    Past Surgical History:  Procedure Laterality Date  . APPENDECTOMY  1967  . NASAL SINUS SURGERY     x 4 as a child  . VASECTOMY      There were no vitals filed for this visit.      Subjective Assessment - 08/31/16 1023    Subjective No falls since last visit. Stretching and exercises with trainer help with the siffness                         [facility] Adult PT Treatment/Exercise - 08/31/16 0001      Ambulation/Gait   Stairs Yes   Stairs Assistance 5: Supervision   Stairs Assistance Details (indicate cue type and reason) Pt noted that back on heel tends to catch on step when descending.  Progressed activity with carrying a weighted bag.                     Stair Management Technique One rail Right;Alternating pattern   Number of Stairs 4  x4           [facility]) - 08/31/16 1408    PWR! exercises Moves in standing   PWR! Up x10   PWR! Rock [employer]! Twist x20   PWR! Step x20   Comments min v.c. For technique      Moves in quadruped PWR! STEP  x10.      Balance Exercises - 08/31/16 1703      Balance Exercises: Standing   Step Ups Forward;6 inch;Intermittent UE support  balance with different LEs leading progressing with stepping forward           PT Education - 08/31/16 1707    Education provided Yes   Education Details Re-issued HEP PWR Moves! in standing.   Person(s) Educated Patient   Methods Explanation;Demonstration;Verbal cues;Handout   Comprehension Verbalized understanding;Returned demonstration;Verbal cues required;Need further instruction          PT Short Term Goals - 08/10/16 1239      PT SHORT TERM GOAL #1   Title Pt will  be independent with HEP for balance, functional strengthening and gait.  TARGET 09/08/16   Time 4   Period Weeks   Status New     PT SHORT TERM GOAL #2   Title Pt will improve TUG score to less than or equal to 13.5 seconds for decreased fall risk.   Time 4   Period Weeks   Status New     PT SHORT TERM GOAL #3   Title Pt will improve single limb stance to at least 3 seconds on LLE for improved stair negotiation and obstacle negotiation.   Time 4   Period Weeks   Status New     PT SHORT TERM GOAL #4   Title Pt will negotiate 4 steps,carrying objects resembling boxes or laundry that he would carry at home, step to pattern, with supervision.   Time 4   Period Weeks   Status New           PT Long Term Goals - 08/10/16 1244      PT LONG TERM GOAL #1   Title Pt will verbalize understanding of fall prevention/tips to reduce freezing with gait.  TARGET 10/09/16   Time 8   Period Weeks   Status New     PT LONG TERM GOAL #2   Title Pt will improve TUG cognitive to  less than or equal to 15 seconds for decreased fall risk.   Time 8   Period Weeks   Status New     PT LONG TERM GOAL #3   Title Pt will improve MiniBESTest to at least 24/28 for decreased fall risk.   Time 8   Period Weeks   Status New     PT LONG TERM GOAL #4   Title Pt will negotiate at least 4 steps carrying objects simulating household items, step to pattern, modified independently no LOB.   Time 8   Period Weeks     PT LONG TERM GOAL #5   Title Pt will improved gait velocity to at least 2.62 ft/sec for improved gait efficiency and safety.   Time 8   Period Weeks   Status New               Plan - 08/31/16 1710    Clinical Impression Statement Pt performed at supervision level carrying object while negotiating steps; pt tends to catch heel on step when descending.  Re-issued HEP PWR! Moves in standing.   Rehab Potential Good   PT Frequency 2x / week   PT Duration 8 weeks  plus eval   PT Treatment/Interventions ADLs/Self Care Home Management;Functional mobility training;Gait training;Stair training;Therapeutic activities;Therapeutic exercise;Balance training;Neuromuscular re-education;Patient/family education   PT Next Visit Plan Review sitting and standing PWR! Moves; work on [employer] additions to HEP-forward step ups, forward step downs; work on Industrial/product designer with and without carrying objects   Consulted and Agree with Plan of Care Patient      Patient will benefit from skilled therapeutic intervention in order to improve the following deficits and impairments:  Abnormal gait, Decreased balance, Decreased mobility, Difficulty walking, Decreased strength, Postural dysfunction  Visit Diagnosis: No diagnosis found.     Problem List Patient Active Problem List   Diagnosis Date Noted  . Fatigue 03/15/2016  . Parkinsonian features 09/15/2015  . ED (erectile dysfunction) 09/15/2015  . MDD (major depressive disorder), recurrent episode, severe ([place]) 02/10/2014  .  Nonspecific abnormal electrocardiogram (ECG) (EKG) 02/07/2014  . Non-compliant behavior 02/03/2014  . Morton's neuroma of left  foot 08/07/2013  . 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 Hyperglycemia 01/26/2012  . Hypogonadism male 01/24/2012  . Syncope and collapse 01/20/2012  .  OSA (obstructive sleep apnea) 01/17/2011  . CHEST TIGHTNESS 02/18/2008   Bjorn Loser, PTA  08/31/16, 5:16 PM [facility] [address] [place], [place], [address] Phone: [phone]   Fax:  [phone]  Name: [name] [name] MRN: [id number] Date of Birth: 08-Feb-1951

## 2016-08-31 NOTE — Therapy (Signed)
[facility] [address] [place], [place], [address] Phone: [phone]   Fax:  [phone]  Speech Language Pathology Treatment  Patient Details  Name: [name] [name] MRN: [id number] Date of Birth: Sep 19, 1951 Referring Provider: Alonza Bogus, D.O.  Encounter Date: 08/31/2016      End of Session - 08/31/16 1148    Visit Number 2   Number of Visits 17   Date for SLP Re-Evaluation 10/28/16   Authorization - Visit Number 1   Authorization - Number of Visits 10   SLP Start Time [id number]   SLP Stop Time  1146   SLP Time Calculation (min) 43 min   Activity Tolerance Patient tolerated treatment well      Past Medical History:  Diagnosis Date  .  OSA (obstructive sleep apnea) 01/17/2011   npsg 2012:  AHI 67/hr. Auto titration 2012:  Optimal pressure 12cm.   . APPENDECTOMY, HX OF 02/18/2008   Qualifier: Diagnosis of  By: [place], [place]    . Cardiac murmur    as a child  . Depression   . Headache(784.0)   . HEADACHES, HX OF 02/18/2008   Qualifier: Diagnosis of  By: Danny Lawless CMA, [place]    . Parkinson disease ([place]) 11/2014  . Streptococcal meningitis    as an infant    Past Surgical History:  Procedure Laterality Date  . APPENDECTOMY  1967  . NASAL SINUS SURGERY     x 4 as a child  . VASECTOMY      There were no vitals filed for this visit.             ADULT SLP TREATMENT - 08/31/16 1142      General Information   Behavior/Cognition Alert;Cooperative;Pleasant mood     Treatment Provided   Treatment provided Cognitive-Linquistic     Pain Assessment   Pain Assessment No/denies pain     Cognitive-Linquistic Treatment   Treatment focused on Cognition;Dysarthria   Skilled Treatment SLP facilitated WNL conversational loudness by pt producing loud /a/ - average 91dB with initial min verbal cues for loudness. SLP worked with pt's loudness in functional conversation in discussing memory strategies  and compensations with pt as he expressed frustration at leaving a room for something and not remembering what he was going to retrieve. (see pt instructions). Pt's average loudness was 67dB during this 25 minute discussion, with rare min verbal cues from SLP for loudness.      Assessment / Recommendations / Plan   Plan Continue with current plan of care     Progression Toward Goals   Progression toward goals Progressing toward goals          SLP Education - 08/31/16 1147    Education provided Yes   Education Details memory strategies/compensations, practical usage in pt's daily activities   Person(s) Educated Patient   Methods Explanation;Handout   Comprehension Verbalized understanding          SLP Short Term Goals - 08/31/16 1159      SLP SHORT TERM GOAL #1   Title Pt will average loud /a/ of 88dB over 3 sessions   Time 4   Period Weeks   Status On-going     SLP SHORT TERM GOAL #2   Title Pt will average 70dB during structured speech tasks and simple conversation with rare min A over 3 sessions   Time 4   Period Weeks   Status On-going     SLP SHORT TERM GOAL #  3   Title pt will demo labial HEP with rare min A over three sessions   Time 4   Period Weeks   Status On-going     SLP SHORT TERM GOAL #4   Title pt will demo compensations for anomia in 5 minutes simple conversation   Time 4   Period Weeks   Status On-going          SLP Long Term Goals - 08/31/16 1159      SLP LONG TERM GOAL #1   Title pt to maintain average 70dB in 15 minutes mod complex conversation outside [place] room over two sessions   Time 8   Period Weeks   Status On-going     SLP LONG TERM GOAL #2   Title pt will demo aphasia compenastions functionally in 8 minutes mod complex conversation over two sessions   Time 8   Period Weeks   Status On-going     SLP LONG TERM GOAL #3   Title pt will demo appropriate breath support for 8 minutes mod complex conversation    Time 8   Period  Weeks   Status On-going          Plan - 08/31/16 1149    Clinical Impression Statement Pt presents with reduced conversational loudness from dysarthria due to Parkinson's disease, as well as memory deficits also believed due to Parkinson's disease. Pt would cont to benefit from skilled ST addressing conversational loudness and other areas of language and cogntive-linguistics, as pt has reported dysnomic and working memory problems with verbal expression as well.    Speech Therapy Frequency 2x / week   Duration --  8 weeks   Treatment/Interventions Language facilitation;Internal/external aids;Compensatory techniques;SLP instruction and feedback;Multimodal communcation approach;Cognitive reorganization;Functional tasks;Cueing hierarchy;Patient/family education   Potential to Achieve Goals Good   Potential Considerations --  visit limitations (insurance)   Consulted and Agree with Plan of Care Patient      Patient will benefit from skilled therapeutic intervention in order to improve the following deficits and impairments:   Dysarthria and anarthria  Aphasia  Cognitive communication deficit    Problem List Patient Active Problem List   Diagnosis Date Noted  . Fatigue 03/15/2016  . Parkinsonian features 09/15/2015  . ED (erectile dysfunction) 09/15/2015  . MDD (major depressive disorder), recurrent episode, severe ([place]) 02/10/2014  . Nonspecific abnormal electrocardiogram (ECG) (EKG) 02/07/2014  . Non-compliant behavior 02/03/2014  . Morton's neuroma of left foot 08/07/2013  . 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 Hyperglycemia 01/26/2012  . Hypogonadism male 01/24/2012  . Syncope and collapse 01/20/2012  .  OSA (obstructive sleep apnea) 01/17/2011  . CHEST TIGHTNESS 02/18/2008    Zinnia Tindall ,MS, CCC-SLP  08/31/2016, 12:01 PM  [facility] [address] [place],  [place], [address] Phone: [phone]   Fax:  [phone]   Name: [name] [name] MRN: [id number] Date of Birth: October 12, 1950

## 2016-08-31 NOTE — Patient Instructions (Addendum)
    In order to recall it quickly and accurately, repeat the item you are going out of the room for, maybe picture yourself using the item.  To remember things throughout the day, you may want to use a 3x5" spiral notebook and keep it in your pocket. -have a page for each day - date each page. This will help if you have to go back to look something up later on.    =================================================================================== Memory Compensation Strategies  1. Use "WARM" strategy. W= write it down A=  associate it R=  repeat it M=  make a mental picture  2. You can keep a Social worker. Use a 3-ring notebook with sections for the following:  calendar, important names and phone numbers, medications, doctors' names/phone numbers, "to do list"/reminders, and a section to journal what you did each day  3. Use a calendar to write appointments down.  4. Write yourself a schedule for the day.  This can be placed on the calendar or in a separate section of the Memory Notebook.  Keeping a regular schedule can help memory.  5. Use medication organizer with sections for each day or morning/evening pills  You may need help loading it  6. Keep a basket, or pegboard by the door.   Place items that you need to take out with you in the basket or on the pegboard.  You may also want to include a message board for reminders.  7. Use sticky notes. Place sticky notes with reminders in a place where the task is performed.  For example:  "turn off the stove" placed by the stove, "lock the door" placed on the door at eye level, "take your medications" on the bathroom mirror or by the place where you normally take your medications  8. Use alarms/timers.  Use while cooking to remind yourself to check on food or as a reminder to take your medicine, or as a reminder to make a call, or as a reminder to perform another task, etc.  9. Use a small tape recorder to record important  information and notes for yourself.

## 2016-08-31 NOTE — Therapy (Signed)
[facility] [address] [place], [place], [address] Phone: [phone]   Fax:  [phone]  Occupational Therapy Treatment  Patient Details  Name: [name] [name] MRN: [id number] Date of Birth: 10-Apr-1951 Referring Provider: Dr. Carles Collet  Encounter Date: 08/31/2016      OT End of Session - 08/31/16 1403    Visit Number 2   Number of Visits 13   Date for OT Re-Evaluation 10/18/15  date extended as pt was seen for first visit today   Authorization Type [employer]   Authorization Time Period 75 visit limit combined need to check to see how many visits used   Authorization - Visit Number 2   Authorization - Number of Visits 11  remaining for year   OT Start Time 1150   OT Stop Time 1230   OT Time Calculation (min) 40 min   Activity Tolerance Patient tolerated treatment well   Behavior During Therapy [facility] for tasks assessed/performed      Past Medical History:  Diagnosis Date  .  OSA (obstructive sleep apnea) 01/17/2011   npsg 2012:  AHI 67/hr. Auto titration 2012:  Optimal pressure 12cm.   . APPENDECTOMY, HX OF 02/18/2008   Qualifier: Diagnosis of  By: [place], [place]    . Cardiac murmur    as a child  . Depression   . Headache(784.0)   . HEADACHES, HX OF 02/18/2008   Qualifier: Diagnosis of  By: Danny Lawless CMA, [place]    . Parkinson disease ([place]) 11/2014  . Streptococcal meningitis    as an infant    Past Surgical History:  Procedure Laterality Date  . APPENDECTOMY  1967  . NASAL SINUS SURGERY     x 4 as a child  . VASECTOMY      There were no vitals filed for this visit.       Arm bike x 6 mins level 1 for conditioning, pt maintained 38-40 rpm Discussion with pt regarding activities to perform at the gym and importance of stretching to avoid injury. Pt practiced opening and closing a small bottle using adapted strategies                [facility]) - 08/31/16 1408    PWR! exercises Moves  in sitting   PWR! Up x20   PWR! Rock [employer]! Twist x20   PWR! Step x20   Comments min v.c. and demonstration for large amplitude movements             OT Education - 08/31/16 1400    Education provided Yes   Education Details recommendations for UE stretching and limiting biceps curls at gym, PWR! seated   Person(s) Educated Patient   Methods Explanation;Demonstration;Verbal cues;Handout   Comprehension Verbalized understanding;Returned demonstration;Verbal cues required          OT Short Term Goals - 08/10/16 1053      OT SHORT TERM GOAL #1   Title Pt will be independent with PD specific  HEP    Time 3   Period Weeks   Status New     OT SHORT TERM GOAL #2   Title Pt will demonstrate improved fine motor coordination as evidenced by decreasing LUE 9 hole peg test to 40 secs or less.   Baseline RUE 24.85 secs, LUE 44.57 secs   Time 3   Period Weeks   Status New     OT SHORT TERM GOAL #3   Title Pt will  verbalize understanding of cognitive compensation strategies/ways to promote cognitive skills prn.   Time 3   Period Weeks   Status New           OT Long Term Goals - 08/10/16 1056      OT LONG TERM GOAL #1   Title Pt will verbalize understanding of adapted strategies for ADLs/IADLs prn    Time 6   Period Weeks   Status New     OT LONG TERM GOAL #2   Title Pt will demonstrate improved functional standing balance as evidenced by increasing LUE standing functional reach to 10 inches or greater   Baseline RUE 10.5 in, LUE 8 in   Time 6   Period Weeks   Status New     OT LONG TERM GOAL #3   Title Pt will demonstrate ability to write a paragraph with 100% legibility and no significant decrease in letter size.   Baseline Pt reports letter size decreases when he writes a paragraph   Time 6   Period Weeks   Status New     OT LONG TERM GOAL #4   Title Pt will verbalize understanding of ways to prevent future PD-related complications and verbalize  understanding of community resources.   Time 6   Period Weeks   Status New               Plan - 08/31/16 1401    Clinical Impression Statement Pt is progressing towards goals. today is pt's first visit since inital eval as pt wanted to wait to schedule visits when he could be seen by all three disciplines.   Rehab Potential Good   OT Frequency 2x / week   OT Duration 6 weeks   OT Treatment/Interventions Self-care/ADL training;Therapeutic exercise;Cognitive remediation/compensation;Visual/perceptual remediation/compensation;Neuromuscular education;Parrafin;Moist Heat;Fluidtherapy;Energy conservation;Therapeutic exercises;Patient/family education;Balance training;Therapeutic activities;Passive range of motion;Manual Therapy;DME and/or AE instruction;Ultrasound;Cryotherapy   Plan renew to extend end date, progress HEP(coordination activities)   Consulted and Agree with Plan of Care Patient      Patient will benefit from skilled therapeutic intervention in order to improve the following deficits and impairments:  Decreased cognition, Impaired flexibility, Decreased mobility, Decreased coordination, Decreased endurance, Decreased range of motion, Decreased strength, Impaired UE functional use, Impaired tone, Impaired perceived functional ability, Decreased safety awareness, Difficulty walking, Decreased balance  Visit Diagnosis: Other symptoms and signs involving the nervous system  Other symptoms and signs involving the musculoskeletal system  Abnormal posture  Other abnormalities of gait and mobility  Other lack of coordination  Attention and concentration deficit    Problem List Patient Active Problem List   Diagnosis Date Noted  . Fatigue 03/15/2016  . Parkinsonian features 09/15/2015  . ED (erectile dysfunction) 09/15/2015  . MDD (major depressive disorder), recurrent episode, severe ([place]) 02/10/2014  . Nonspecific abnormal electrocardiogram (ECG) (EKG) 02/07/2014  .  Non-compliant behavior 02/03/2014  . Morton's neuroma of left foot 08/07/2013  . 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 Hyperglycemia 01/26/2012  . Hypogonadism male 01/24/2012  . Syncope and collapse 01/20/2012  .  OSA (obstructive sleep apnea) 01/17/2011  . CHEST TIGHTNESS 02/18/2008    RINE,KATHRYN 08/31/2016, 2:09 PM  [facility] [address] [place], [place], [address] Phone: [phone]   Fax:  [phone]  Name: FELTON GARNET MRN: [id number] Date of Birth: 1951/02/18

## 2016-09-01 ENCOUNTER — Ambulatory Visit: Payer: Federal, State, Local not specified - PPO

## 2016-09-01 DIAGNOSIS — R29818 Other symptoms and signs involving the nervous system: Secondary | ICD-10-CM

## 2016-09-01 DIAGNOSIS — R2689 Other abnormalities of gait and mobility: Secondary | ICD-10-CM | POA: Diagnosis not present

## 2016-09-01 DIAGNOSIS — R29898 Other symptoms and signs involving the musculoskeletal system: Secondary | ICD-10-CM

## 2016-09-01 DIAGNOSIS — R269 Unspecified abnormalities of gait and mobility: Secondary | ICD-10-CM

## 2016-09-01 DIAGNOSIS — R259 Unspecified abnormal involuntary movements: Secondary | ICD-10-CM

## 2016-09-01 DIAGNOSIS — R2681 Unsteadiness on feet: Secondary | ICD-10-CM

## 2016-09-01 NOTE — Therapy (Signed)
[place], [place], [address] Phone: [phone]   Fax:  [phone]  Physical Therapy Evaluation  Patient Details  Name: [name] [name] MRN: [id number] Date of Birth: 12-02-1950 Referring Provider: Alonza Bogus , DO  Encounter Date: 09/01/2016      PT End of Session - 09/01/16 1613    PT Start Time 0715   PT Stop Time 1145   PT Time Calculation (min) 270 min   Activity Tolerance Patient tolerated treatment well;No increased pain   Behavior During Therapy WFL for tasks assessed/performed      Past Medical History:  Diagnosis Date  .  OSA (obstructive sleep apnea) 01/17/2011   npsg 2012:  AHI 67/hr. Auto titration 2012:  Optimal pressure 12cm.   . APPENDECTOMY, HX OF 02/18/2008   Qualifier: Diagnosis of  By: [place], [place]    . Cardiac murmur    as a child  . Depression   . Headache(784.0)   . HEADACHES, HX OF 02/18/2008   Qualifier: Diagnosis of  By: Danny Lawless CMA, [place]    . Parkinson disease ([place]) 11/2014  . Streptococcal meningitis    as an infant    Past Surgical History:  Procedure Laterality Date  . APPENDECTOMY  1967  . NASAL SINUS SURGERY     x 4 as a child  . VASECTOMY      There were no vitals filed for this visit.       Subjective Assessment - 09/01/16 1604    Subjective . Needs paper filled out for MD about functional abilities   Currently in Pain? No/denies                          [facility]) - 08/31/16 1408    PWR! exercises g   PWR! Up x20   PWR! Rock [employer]! Twist x20   PWR! Step x20   Comments min v.c. and demonstration for large amplitude movements          Balance Exercises - 08/31/16 1703      Balance Exercises: Standing   Step Ups Forward;6 inch;Intermittent UE support  balance with different LEs leading progressing with stepping           PT Education - 09/01/16 1607    Education provided Yes   Education Details  Reviewed results of FCE with Mr Kienle and his spouse   Person(s) Educated Patient   Methods Explanation   Comprehension Verbalized understanding          PT Short Term Goals - 08/10/16 1239      PT SHORT TERM GOAL #1   Title Pt will be independent with HEP for balance, functional strengthening and gait.  TARGET 09/08/16   Time 4   Period Weeks   Status New     PT SHORT TERM GOAL #2   Title Pt will improve TUG score to less than or equal to 13.5 seconds for decreased fall risk.   Time 4   Period Weeks   Status New     PT SHORT TERM GOAL #3   Title Pt will improve single limb stance to at least 3 seconds on LLE for improved stair negotiation and obstacle negotiation.   Time 4   Period Weeks   Status New     PT SHORT TERM GOAL #4   Title Pt will negotiate 4 steps,carrying objects resembling boxes or laundry that  he would carry at home, step to pattern, with supervision.   Time 4   Period Weeks   Status New           PT Long Term Goals - 08/10/16 1244      PT LONG TERM GOAL #1   Title Pt will verbalize understanding of fall prevention/tips to reduce freezing with gait.  TARGET 10/09/16   Time 8   Period Weeks   Status New     PT LONG TERM GOAL #2   Title Pt will improve TUG cognitive to less than or equal to 15 seconds for decreased fall risk.   Time 8   Period Weeks   Status New     PT LONG TERM GOAL #3   Title Pt will improve MiniBESTest to at least 24/28 for decreased fall risk.   Time 8   Period Weeks   Status New     PT LONG TERM GOAL #4   Title Pt will negotiate at least 4 steps carrying objects simulating household items, step to pattern, modified independently no LOB.   Time 8   Period Weeks     PT LONG TERM GOAL #5   Title Pt will improved gait velocity to at least 2.62 ft/sec for improved gait efficiency and safety.   Time 8   Period Weeks   Status New               Plan - 09/01/16 1614    Clinical Impression Statement He will  continue per Neuro plans. FCE results will be faxed and mailed to Dr Tat in next 1-2 days. Mr Genter was rated at a light level for work.   PT Treatment/Interventions ADLs/Self Care Home Management;Functional mobility training;Gait training;Stair training;Therapeutic activities;Therapeutic exercise;Balance training;Neuromuscular re-education;Patient/family education   PT Next Visit Plan Review sitting and standing PWR! Moves; work on [employer] additions to HEP-forward step ups, forward step downs; work on Industrial/product designer with and without carrying objects   Consulted and Agree with Plan of Care Patient;Family member/caregiver   Family Member Consulted spouse      Patient will benefit from skilled therapeutic intervention in order to improve the following deficits and impairments:  Abnormal gait, Decreased balance, Decreased mobility, Difficulty walking, Decreased strength, Postural dysfunction  Visit Diagnosis: Unsteadiness - Plan: PT plan of care cert/re-cert  Rigidity - Plan: PT plan of care cert/re-cert  Parkinsonian features - Plan: PT plan of care cert/re-cert  Unsteadiness on feet - Plan: PT plan of care cert/re-cert  Abnormality of gait - Plan: PT plan of care cert/re-cert     Problem List Patient Active Problem List   Diagnosis Date Noted  . Fatigue 03/15/2016  . Parkinsonian features 09/15/2015  . ED (erectile dysfunction) 09/15/2015  . MDD (major depressive disorder), recurrent episode, severe ([place]) 02/10/2014  . Nonspecific abnormal electrocardiogram (ECG) (EKG) 02/07/2014  . Non-compliant behavior 02/03/2014  . Morton's neuroma of left foot 08/07/2013  . 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 Hyperglycemia 01/26/2012  . Hypogonadism male 01/24/2012  . Syncope and collapse 01/20/2012  .  OSA (obstructive sleep apnea) 01/17/2011  . CHEST TIGHTNESS 02/18/2008    Darrel Hoover  PT 09/01/2016, 4:24 PM  [facility] [address] [place], [place], [address] Phone: [phone]   Fax:  [phone]  Name: IZSAK HARTUNIAN MRN: [id number] Date of Birth: 1951-09-11

## 2016-09-05 NOTE — Progress Notes (Deleted)
[name] [name] was seen today in the movement disorders clinic for neurologic consultation at the request of [name], MD.  The consultation is for the evaluation of slowness, flat affect and to r/o a neurologic disorder.  This patient is accompanied in the office by his spouse who supplements the history.  The first symptom(s) the patient noticed was a feeling of weakness that has been going on for about a year.  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  For about a year and a half he has noted a "body" tremor and his wife has noted a hand tremor in both hands (at rest).  He is stiff like he is "a 65 year old man."  Once he is in bed he finds it is hard to move.  The patient has a hx of significant depression and is on multiple medications related to this.  He was started on cogentin just yesterday, but is also on celexa, wellbutrin and latuda.  He has been on [place] for 1.5 months.  Prior to that he was on abilify (tried on 2 separate times but most recently was only on it for a day) and geodon(doesn't remember this but looks like it may have been given in the past).  He has been on risperdal but cannot remember how long ago.  ECT was offered but his wife does not want him to proceed with that  11/20/14 update:  The patient returns today for follow-up.  He is accompanied by his wife who supplements the history.  He has been off of [place] for about 2 weeks.  Unfortunately, he was unable to afford the dat scan.  He is on just Wellbutrin and Celexa for depression and his Cogentin and has been discontinued.  He has noted a decreased appetite and dry mouth and having trouble sleeping because of stiffness.  Pt states that he is almost afraid to go to sleep.  Missing 2 days of work per week (works nights) but a lot of that because of anxiety.  His wife notices increased tremor.  He has slow at work.  Admits to some  lightheadedness but no syncope.  Note diplopia.  No hallucinations.  His wife is doing most of the driving because he is having difficulty staying in his own lane.  02/19/15 update:  The patient is following up today regarding his parkinsonism.  I reviewed records from his psychiatrist since last visit.  His psychiatry note from 12/04/14 indicates that the patient stated that tremor was better.  However, I received a call 4 days later stating that tremor was worse and he thought it was from the Mirapex.  He wanted to change the Mirapex to something else.  He was on Mirapex 0.5 mg 3 times per day at the time.  We ended up switching it to Requip 1 mg 3 times a day.  Fortunately he is doing better.  He denies any side effects with the Requip.  He currently takes it at 8 AM/2 PM/8 PM.  He does state that he notices that he "shuffles with the left leg."  He has not fallen but sometimes feels off balance.  He states that he finished his physical/occupational/speech therapy and feels that it went well.  He is not exercising on his own.  He initially states that he does not feel that he has time, but also states that he does not  go into work until 2 PM and works until 10:30 PM.  This is a change for him.  He was able to change his job at [employer] and seems like that is doing better.  His wife mentions that he continues to have memory loss that she thinks has increased, but she also states that she has noticed memory loss ever since starting on antidepressants.  He had one fall since last visit.  This was when he was taking the trash to the street.  He had no fractures with this, just a superficial scrape.    05/25/15 update:  The patient presents today, accompanied by his wife who supplements the history.  His Requip was increased last visit so that was taking 2 tablets in the morning, one in afternoon and 2 in the evening and 1 day after I started this, I received a call from his wife that the patient did not  tolerate it and it made symptoms worse (which is what they reported with Mirapex).  While that was very odd to me, I told them to go back down to 1 mg 3 times per day.  He actually comes back on 1 mg, 1 in the AM, 2 in the afternoon and 1 in the evening.  He reports that he is doing worse.  He has more tremor and he feels tired in the evening.  Pts wife asks about trying to go back up on the requip slowly.   He had neuropsych testing in June at [facility].  I reviewed that.  There was no evidence of dementia.  There was evidence of mild cognitive impairment from Parkinson's disease as well as from chronic major depressive disorder.  It is recommended that the patient consider transcranial magnetic stimulation for depression, and he told the neuropsychologist that he considered in the past and opted against it.  She encouraged him to attend a Parkinson's support group and he told her that he tried that once and it was not helpful.  He still c/o "my memory has been terrible and especially over the last few weeks."  He feels that he is not depressed - "that is well controlled."  He is not doing CV exercise.  He is still in his new job but it seems more physical than his old job and he thinks he is not as fast in getting tasks done.  He asks about DBS.  08/25/15 update:  The patient is following up today, accompanied by his wife who supplements the history.  I have reviewed records since our last visit.  He is on Requip XL, which was increased to 6 mg at night last visit.  He changed that back to 2 mg tid of the regular ropinirole because of cost.   He is enrolled in physical therapy.  "I'm doing bad."  When asked to describe how he feels bad, he has trouble describing it.  He had a root canal and hasn't felt well since.  He hasn't been back to work for 4 weeks.  He feels that his speech is low and states that he went to voice therapy 2 weeks ago.  He isn't practicing the voice therapy faithfully.  He is  riding the bike on the highest resistance 20 mins a day.  He is having word finding trouble.   No falls but feels that he is "weaker."  He asks me about going to "light duty" at work and possibly retirement in the near future.  He  continues to see psychiatry for his depression.  He last saw psychiatry on 06/04/2015.  No changes were made in medication as he stated that mood was good and continues to reaffirm that.    11/30/15 update:  The patient follows up today, accompanied by his wife who supplements the history.  Last visit, his ropinirole was increased to 4 mg in the morning (8am but goes back to bed for another 1 hour or so), another 2 mg in the afternoon (1-2 pm) and evening (8-8:30pm).  I changed him to light duty at work last visit at his request.  He called me recently and wanted changed from being able to work 4 hours per day to a total of only 10 hours per week.  I told him that he would need a functional capacity evaluation for this, as I was not sure that this made sense from a Parkinson standpoint, given his stage of Parkinson's disease.  He was fairly frustrated with this recommendation, but I told him that I needed some objective evidence.  He reports that he is awaiting an appt for the FCE (has called).   He has been seeing psychiatry and I  actually received a call from his psychiatrist back in December that his psychiatrist felt that he needed to be on levodopa.  I did not think that was the case, but I was willing to do a levodopa challenge to see if that was the case.  He was scheduled to have that done, but the patient canceled that.  Does report that he thinks that depression under good control.   He denies falls.  Some lightheadedness when first gets up, but no near syncope.  Using bike daily for 15-20 minutes.  No hallucinations; rare visual distortions.  Wife thinks that he seems more stiff and has involuntary "tics and shaking" at night.  Waking up with AM headache.  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  03/29/16 update:  The patient follows up today, accompanied by his wife who supplements the history.  Last visit, his ropinirole was increased to 4 mg in the morning, 2 mg in the afternoon and 2 mg in the evening.  Has intermittent spells of increased stiffness, slower on stairs.  Riding recumbant bike at home.  He has been seeing psychiatry and is still on celexa and wellbutrin.  I reviewed last records with Dr. Adele Schilder and pt continued to c/o memory issues (long standing c/o) and was told to discuss with me as may be PD related.  Pt had neuropsych testing done about a year ago and demonstrated no neurodegenerative memory change related to parkinsons but did feel that depression was an issue affecting cognition.  Thinks that memory is worse. No falls since last visit.  He has attended PT/OT/ST from March-May.  He c/o drooling.  Also c/o talking at night and some tremor at night.  Happens most night.  Also c/o snoring and wife thinks that is due to weight and trying to cut back on sugars.  Has OSAS but doesn't use the CPAP.  Has some visual distortions but no hallucinations.  Can be frightening.  Having some urinary incontinence.  Saw urology in the past but not for incontinence.    05/30/16 update:  The patient follows up today, accompanied by his wife who supplements the history.  He is on ropinirole, 4 mg in the morning, 2 mg in the afternoon and 2 mg in the evening.  He denies compulsive behaviors.  He denies sleep attacks.  He is now attending the ACT gym and feels he is getting benefit from this.  He is getting scholarship information.  He had repeat neuropsych testing on August 2 with a follow-up with Dr. Si Raider on August 10.  This was largely consistent with his performance one year prior.  There is mild cognitive impairment consistent with Parkinson's disease and untreated obstructive sleep apnea syndrome.  There was evidence of major depressive disorder,  and Dr. Si Raider felt that there was long-standing personality issues that influenced the patient's view of the world in a negative fashion.  He saw urology since last visit and I did get a note from them.  It was not clear what medication he was tried on, just stated that he tried a beta 3 agonist (?  Myrbetriq).  Pt states that it was in fact myrbetriq that was added.  Pt states that it hasn't helped but wife thinks that he isn't getting up as much during the night.  He was getting up 3 times during the night and now he may get up 1-2 times during the night.  Having some dizziness when gets up for about 2 min.  Drinking 2 L of water a day.  Given up sugar completely.  I did order a split night PSG last time but results are not available.  He c/o increased drooling.  09/07/16 update:  Patient follows up today, accompanied by his wife who supplements the history.  He is on ropinirole, 4 Mill grams in the morning, 2 mg in the afternoon and 2 mg in the evening.  Pt denies falls.  Pt denies lightheadedness, near syncope.  No hallucinations.  Mood has been good.  He remains on a combination of Wellbutrin and Celexa for depression and thinks that this has been well controlled.  He saw psychiatry since our last visit.  He was referred to psychology for counseling.  Neuroimaging has previously been performed.  It is available for my review today and I reviewed it with him.  There is no BG disease.  ALLERGIES:  No Known Allergies  CURRENT MEDICATIONS:  Outpatient Encounter Prescriptions as of 09/07/2016  Medication Sig  . B Complex-C (B-COMPLEX WITH VITAMIN C) tablet Take 1 tablet by mouth daily.  Marland Kitchen buPROPion (WELLBUTRIN XL) 300 MG 24 hr tablet Take 1 tablet (300 mg total) by mouth every morning.  . citalopram (CELEXA) 40 MG tablet Take 1 tablet (40 mg total) by mouth daily.  Marland Kitchen ketoconazole (NIZORAL) 2 % cream Apply 1 application topically 2 (two) times daily.  Marland Kitchen MYRBETRIQ 50 MG TB24 tablet Take 50 mg by mouth  daily.  . Omega 3-6-9 Fatty Acids (OMEGA 3-6-9 PO) Take by mouth.  Marland Kitchen rOPINIRole (REQUIP) 2 MG tablet TAKE 2 TABLETS IN THE AM, 1 IN THE AFTERNOON AND 1 IN THE EVENING  . sildenafil (VIAGRA) 50 MG tablet Take 1 tablet (50 mg total) by mouth daily as needed for erectile dysfunction. (Patient not taking: Reported on 08/10/2016)  . TURMERIC CURCUMIN PO Take by mouth.   No facility-administered encounter medications on file as of 09/07/2016.     PAST MEDICAL HISTORY:   Past Medical History:  Diagnosis Date  .  OSA (obstructive sleep apnea) 01/17/2011   npsg 2012:  AHI 67/hr. Auto titration 2012:  Optimal pressure 12cm.   . APPENDECTOMY, HX OF 02/18/2008   Qualifier: Diagnosis of  By: [place], [place]    . Cardiac murmur  as a child  . Depression   . Headache(784.0)   . HEADACHES, HX OF 02/18/2008   Qualifier: Diagnosis of  By: Danny Lawless CMA, [place]    . Parkinson disease ([place]) 11/2014  . Streptococcal meningitis    as an infant    PAST SURGICAL HISTORY:   Past Surgical History:  Procedure Laterality Date  . APPENDECTOMY  1967  . NASAL SINUS SURGERY     x 4 as a child  . VASECTOMY      SOCIAL HISTORY:   Social History   Social History  . Marital status: Married    Spouse name: N/A  . Number of children: Y  . Years of education: N/A   Occupational History  . 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 Smoking status: Never Smoker  . Smokeless tobacco: Never Used  . Alcohol use No  . Drug use: No  . Sexual activity: Yes    Birth control/ protection: None   Other Topics Concern  . Not on file   Social History Narrative  . No narrative on file    FAMILY HISTORY:   Family Status  Relation Status  . Father Deceased   lung cancer, alzheimer's  . Mother Alive   HTN, A fib  . Brother Deceased   accident  . Sister Alive   healthy  . Son Alive   healthy  . Daughter Alive   healthy  . Daughter Alive   healthy  . Maternal Aunt Alive    Parkinson's Disease    ROS:  A complete 10 system review of systems was obtained and was unremarkable apart from what is mentioned above.  PHYSICAL EXAMINATION:    VITALS:   There were no vitals filed for this visit. Wt Readings from Last 3 Encounters:  06/01/16 282 lb (127.9 kg)  05/11/16 280 lb (127 kg)  03/29/16 287 lb (130.2 kg)     GEN:  The patient appears stated age and is in NAD. HEENT:  Normocephalic, atraumatic.  The mucous membranes are moist. The superficial temporal arteries are without ropiness or tenderness. CV:  RRR Lungs:  CTAB Neck/HEME:  There are no carotid bruits bilaterally.  Neurological examination:  Orientation: The patient is alert and oriented x3.  Cranial nerves: There is good facial symmetry. 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  Pupils are equal round and reactive to light bilaterally.  The speech is fluent and clear.  He is hypophonic.  The patient is able to make the gutteral sounds without difficulty. Soft palate rises symmetrically and there is no tongue deviation. Hearing is intact to conversational tone. Sensation: Sensation is intact to light touch throughout Motor: Strength is 5/5 in the bilateral upper and lower extremities.   Shoulder shrug is equal and symmetric.  There is no pronator drift.   Movement examination: Tone: There is normal tone bilaterally Abnormal movements: none today Coordination:  There is decremation with RAM's, seen with hand opening and closing on the left, finger taps on the left, alternation of supination/pronation on the left, heel taps and toe taps on the left. Gait and Station: The patient has no difficulty arising out of a deep-seated chair without the use of the hands.   The patient's stride length is decreased with decreased arm swing bilaterally (mild).    His pull test was negative today.  ASSESSMENT/PLAN:  1.  Parkinsonism  -While he certainly could have idiopathic PD, especially since he was  only on  the [place] for 6-8 weeks and sx's preceded that, he was exposed to multiple other antipsychotics and I am unsure of the timeline of those exposures.  He has been off of [place] since early February, and his examination had improved when he got off of [place], even before starting on Requip. Continue requip 4mg /2mg /2mg .  Thinks tremor is improved.  -Move dosages of requip closer together  -he is now in ACT classes with scholarship program  -filled out further disability forms today 2  Depression  -The patient reports that this has been stable.  He is not suicidal or homicidal.  He will remain on Celexa and Wellbutrin and remain under the care of psychiatry.  -has appt with psychiatry in one week 3.  OSAS  - Had study on 05/11/16 but results not yet available.  Will call sleep center and talk with them about that. 4.  Sialorrhea  -refuses botox but has asked me multiple times about what to do about this.  Will let me know if changes mind 5.  Dizziness  -drinking plenty of fluid  -refuses compression stocking/refuses abdominal compression binder  -told him that requip could contribute.    -needs to get up slowly 6.  Memory loss  -He had repeat neuropsych testing on August 2 with a follow-up with Dr. Si Raider on August 10.  This was largely consistent with his performance one year prior.  There is mild cognitive impairment consistent with Parkinson's disease and untreated obstructive sleep apnea syndrome.  There was evidence of major depressive disorder, and Dr. Si Raider felt that there was long-standing personality issues that influenced the patient's view of the world in a negative fashion.  There was not evidence of dementia at this point in time. 7.  Urinary incontinence, frequency and nocturia  -following with [facility] and on myrbetriq 8.  Follow-up with me will be in the next 4 months, sooner should new neurologic issues arise.      Much greater than 50% of this visit was spent in counseling  with the patient and the family.  Total face to face time:  45 min.

## 2016-09-06 NOTE — Progress Notes (Signed)
[name] [name] was seen today in the movement disorders clinic for neurologic consultation at the request of [name], MD.  The consultation is for the evaluation of slowness, flat affect and to r/o a neurologic disorder.  This patient is accompanied in the office by his spouse who supplements the history.  The first symptom(s) the patient noticed was a feeling of weakness that has been going on for about a year.  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  For about a year and a half he has noted a "body" tremor and his wife has noted a hand tremor in both hands (at rest).  He is stiff like he is "a 65 year old man."  Once he is in bed he finds it is hard to move.  The patient has a hx of significant depression and is on multiple medications related to this.  He was started on cogentin just yesterday, but is also on celexa, wellbutrin and latuda.  He has been on [place] for 1.5 months.  Prior to that he was on abilify (tried on 2 separate times but most recently was only on it for a day) and geodon(doesn't remember this but looks like it may have been given in the past).  He has been on risperdal but cannot remember how long ago.  ECT was offered but his wife does not want him to proceed with that  11/20/14 update:  The patient returns today for follow-up.  He is accompanied by his wife who supplements the history.  He has been off of [place] for about 2 weeks.  Unfortunately, he was unable to afford the dat scan.  He is on just Wellbutrin and Celexa for depression and his Cogentin and has been discontinued.  He has noted a decreased appetite and dry mouth and having trouble sleeping because of stiffness.  Pt states that he is almost afraid to go to sleep.  Missing 2 days of work per week (works nights) but a lot of that because of anxiety.  His wife notices increased tremor.  He has slow at work.  Admits to some  lightheadedness but no syncope.  Note diplopia.  No hallucinations.  His wife is doing most of the driving because he is having difficulty staying in his own lane.  02/19/15 update:  The patient is following up today regarding his parkinsonism.  I reviewed records from his psychiatrist since last visit.  His psychiatry note from 12/04/14 indicates that the patient stated that tremor was better.  However, I received a call 4 days later stating that tremor was worse and he thought it was from the Mirapex.  He wanted to change the Mirapex to something else.  He was on Mirapex 0.5 mg 3 times per day at the time.  We ended up switching it to Requip 1 mg 3 times a day.  Fortunately he is doing better.  He denies any side effects with the Requip.  He currently takes it at 8 AM/2 PM/8 PM.  He does state that he notices that he "shuffles with the left leg."  He has not fallen but sometimes feels off balance.  He states that he finished his physical/occupational/speech therapy and feels that it went well.  He is not exercising on his own.  He initially states that he does not feel that he has time, but also states that he does not  go into work until 2 PM and works until 10:30 PM.  This is a change for him.  He was able to change his job at [employer] and seems like that is doing better.  His wife mentions that he continues to have memory loss that she thinks has increased, but she also states that she has noticed memory loss ever since starting on antidepressants.  He had one fall since last visit.  This was when he was taking the trash to the street.  He had no fractures with this, just a superficial scrape.    05/25/15 update:  The patient presents today, accompanied by his wife who supplements the history.  His Requip was increased last visit so that was taking 2 tablets in the morning, one in afternoon and 2 in the evening and 1 day after I started this, I received a call from his wife that the patient did not  tolerate it and it made symptoms worse (which is what they reported with Mirapex).  While that was very odd to me, I told them to go back down to 1 mg 3 times per day.  He actually comes back on 1 mg, 1 in the AM, 2 in the afternoon and 1 in the evening.  He reports that he is doing worse.  He has more tremor and he feels tired in the evening.  Pts wife asks about trying to go back up on the requip slowly.   He had neuropsych testing in June at [facility].  I reviewed that.  There was no evidence of dementia.  There was evidence of mild cognitive impairment from Parkinson's disease as well as from chronic major depressive disorder.  It is recommended that the patient consider transcranial magnetic stimulation for depression, and he told the neuropsychologist that he considered in the past and opted against it.  She encouraged him to attend a Parkinson's support group and he told her that he tried that once and it was not helpful.  He still c/o "my memory has been terrible and especially over the last few weeks."  He feels that he is not depressed - "that is well controlled."  He is not doing CV exercise.  He is still in his new job but it seems more physical than his old job and he thinks he is not as fast in getting tasks done.  He asks about DBS.  08/25/15 update:  The patient is following up today, accompanied by his wife who supplements the history.  I have reviewed records since our last visit.  He is on Requip XL, which was increased to 6 mg at night last visit.  He changed that back to 2 mg tid of the regular ropinirole because of cost.   He is enrolled in physical therapy.  "I'm doing bad."  When asked to describe how he feels bad, he has trouble describing it.  He had a root canal and hasn't felt well since.  He hasn't been back to work for 4 weeks.  He feels that his speech is low and states that he went to voice therapy 2 weeks ago.  He isn't practicing the voice therapy faithfully.  He is  riding the bike on the highest resistance 20 mins a day.  He is having word finding trouble.   No falls but feels that he is "weaker."  He asks me about going to "light duty" at work and possibly retirement in the near future.  He  continues to see psychiatry for his depression.  He last saw psychiatry on 06/04/2015.  No changes were made in medication as he stated that mood was good and continues to reaffirm that.    11/30/15 update:  The patient follows up today, accompanied by his wife who supplements the history.  Last visit, his ropinirole was increased to 4 mg in the morning (8am but goes back to bed for another 1 hour or so), another 2 mg in the afternoon (1-2 pm) and evening (8-8:30pm).  I changed him to light duty at work last visit at his request.  He called me recently and wanted changed from being able to work 4 hours per day to a total of only 10 hours per week.  I told him that he would need a functional capacity evaluation for this, as I was not sure that this made sense from a Parkinson standpoint, given his stage of Parkinson's disease.  He was fairly frustrated with this recommendation, but I told him that I needed some objective evidence.  He reports that he is awaiting an appt for the FCE (has called).   He has been seeing psychiatry and I  actually received a call from his psychiatrist back in December that his psychiatrist felt that he needed to be on levodopa.  I did not think that was the case, but I was willing to do a levodopa challenge to see if that was the case.  He was scheduled to have that done, but the patient canceled that.  Does report that he thinks that depression under good control.   He denies falls.  Some lightheadedness when first gets up, but no near syncope.  Using bike daily for 15-20 minutes.  No hallucinations; rare visual distortions.  Wife thinks that he seems more stiff and has involuntary "tics and shaking" at night.  Waking up with AM headache.  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  03/29/16 update:  The patient follows up today, accompanied by his wife who supplements the history.  Last visit, his ropinirole was increased to 4 mg in the morning, 2 mg in the afternoon and 2 mg in the evening.  Has intermittent spells of increased stiffness, slower on stairs.  Riding recumbant bike at home.  He has been seeing psychiatry and is still on celexa and wellbutrin.  I reviewed last records with Dr. Adele Schilder and pt continued to c/o memory issues (long standing c/o) and was told to discuss with me as may be PD related.  Pt had neuropsych testing done about a year ago and demonstrated no neurodegenerative memory change related to parkinsons but did feel that depression was an issue affecting cognition.  Thinks that memory is worse. No falls since last visit.  He has attended PT/OT/ST from March-May.  He c/o drooling.  Also c/o talking at night and some tremor at night.  Happens most night.  Also c/o snoring and wife thinks that is due to weight and trying to cut back on sugars.  Has OSAS but doesn't use the CPAP.  Has some visual distortions but no hallucinations.  Can be frightening.  Having some urinary incontinence.  Saw urology in the past but not for incontinence.    05/30/16 update:  The patient follows up today, accompanied by his wife who supplements the history.  He is on ropinirole, 4 mg in the morning, 2 mg in the afternoon and 2 mg in the evening.  He denies compulsive behaviors.  He denies sleep attacks.  He is now attending the [employer] gym and feels he is getting benefit from this.  He is getting scholarship information.  He had repeat neuropsych testing on August 2 with a follow-up with Dr. Si Raider on August 10.  This was largely consistent with his performance one year prior.  There is mild cognitive impairment consistent with Parkinson's disease and untreated obstructive sleep apnea syndrome.  There was evidence of major depressive disorder,  and Dr. Si Raider felt that there was long-standing personality issues that influenced the patient's view of the world in a negative fashion.  He saw urology since last visit and I did get a note from them.  It was not clear what medication he was tried on, just stated that he tried a beta 3 agonist (?  Myrbetriq).  Pt states that it was in fact myrbetriq that was added.  Pt states that it hasn't helped but wife thinks that he isn't getting up as much during the night.  He was getting up 3 times during the night and now he may get up 1-2 times during the night.  Having some dizziness when gets up for about 2 min.  Drinking 2 L of water a day.  Given up sugar completely.  I did order a split night PSG last time but results are not available.  He c/o increased drooling.  09/14/16 update:  Patient follows up today, accompanied by his wife who supplements the history.  He is on ropinirole, 4 mg in the morning, 2 mg in the afternoon and 2 mg in the evening.  Pt had one fall on the ice when it snowed but that was it.  Pt denies lightheadedness, near syncope.  No hallucinations.  Mood has been good.  He remains on a combination of Wellbutrin and Celexa for depression and thinks that this has been well controlled.  He saw psychiatry since our last visit.  He was referred to psychology for counseling.  He had a functional capacity evaluation on 09/01/2016.  Stated that the patient could exert up to 20 pounds of force occasionally and up to 10 pounds of force frequently, but it also stated that physical demand requirements are in excess of those for sedentary work.  It stated that any job that the patient should engage in should be rated "light work" when it requires walking or standing to a significant degree, when it requires sitting most of the time but entails pushing and/or pulling of leg or arm controls and/or when the job requires working at a production rate paced entailing constant pushing and/or pulling a materials  even if the weight of his materials is negligible.  It did state that based on the evaluation, the client was capable of sustaining light level of work for an 8 hour per day, 40 hour per week job.  Asks me about drooling.  Does not want to try botox.  Asks me about cough and swollen ankles.  C/o talking in sleep.  Will waken him out sleep.  Having trouble getting in and out of the cars (not so much his truck)..  Hits his head on the door jam.  Started PT last week.  Still very sleepy during the day.    Neuroimaging has previously been performed.  It is available for my review today and I reviewed it with him.  There is no BG disease.  ALLERGIES:  No Known Allergies  CURRENT MEDICATIONS:  Outpatient Encounter  Prescriptions as of 09/14/2016  Medication Sig  . B Complex-C (B-COMPLEX WITH VITAMIN C) tablet Take 1 tablet by mouth daily.  Marland Kitchen buPROPion (WELLBUTRIN XL) 300 MG 24 hr tablet Take 1 tablet (300 mg total) by mouth every morning.  . citalopram (CELEXA) 40 MG tablet Take 1 tablet (40 mg total) by mouth daily.  . fesoterodine (TOVIAZ) 4 MG TB24 tablet Take 4 mg by mouth daily.  . Omega 3-6-9 Fatty Acids (OMEGA 3-6-9 PO) Take by mouth.  Marland Kitchen rOPINIRole (REQUIP) 2 MG tablet TAKE 2 TABLETS IN THE AM, 1 IN THE AFTERNOON AND 1 IN THE EVENING  . TURMERIC CURCUMIN PO Take by mouth.  . carbamide peroxide (DEBROX) 6.5 % otic solution Place 5 drops into the right ear 2 (two) times daily. (Patient not taking: Reported on 09/14/2016)  . [DISCONTINUED] ketoconazole (NIZORAL) 2 % cream Apply 1 application topically 2 (two) times daily. (Patient not taking: Reported on 09/13/2016)  . [DISCONTINUED] MYRBETRIQ 50 MG TB24 tablet Take 50 mg by mouth daily.  . [DISCONTINUED] sildenafil (VIAGRA) 50 MG tablet Take 1 tablet (50 mg total) by mouth daily as needed for erectile dysfunction. (Patient not taking: Reported on 09/13/2016)   No facility-administered encounter medications on file as of 09/14/2016.     PAST  MEDICAL HISTORY:   Past Medical History:  Diagnosis Date  .  OSA (obstructive sleep apnea) 01/17/2011   npsg 2012:  AHI 67/hr. Auto titration 2012:  Optimal pressure 12cm.   . APPENDECTOMY, HX OF 02/18/2008   Qualifier: Diagnosis of  By: [place], [place]    . Cardiac murmur    as a child  . Depression   . Headache(784.0)   . HEADACHES, HX OF 02/18/2008   Qualifier: Diagnosis of  By: Danny Lawless CMA, [place]    . Parkinson disease ([place]) 11/2014  . Streptococcal meningitis    as an infant    PAST SURGICAL HISTORY:   Past Surgical History:  Procedure Laterality Date  . APPENDECTOMY  1967  . NASAL SINUS SURGERY     x 4 as a child  . VASECTOMY      SOCIAL HISTORY:   Social History   Social History  . Marital status: Married    Spouse name: N/A  . Number of children: Y  . Years of education: N/A   Occupational History  . 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 Smoking status: Never Smoker  . Smokeless tobacco: Never Used  . Alcohol use No  . Drug use: No  . Sexual activity: Yes    Birth control/ protection: None   Other Topics Concern  . Not on file   Social History Narrative  . No narrative on file    FAMILY HISTORY:   Family Status  Relation Status  . Father Deceased   lung cancer, alzheimer's  . Mother Alive   HTN, A fib  . Brother Deceased   accident  . Sister Alive   healthy  . Son Alive   healthy  . Daughter Alive   healthy  . Daughter Alive   healthy  . Maternal Aunt Alive   Parkinson's Disease    ROS:  A complete 10 system review of systems was obtained and was unremarkable apart from what is mentioned above.  PHYSICAL EXAMINATION:    VITALS:   Vitals:   09/14/16 0829  BP: 122/76  Pulse: 74  Weight: 285 lb (129.3 kg)  Height:  6\' 2"  (1.88 m)   Wt Readings from Last 3 Encounters:  09/14/16 285 lb (129.3 kg)  09/13/16 285 lb (129.3 kg)  06/01/16 282 lb (127.9 kg)     GEN:  The patient appears stated  age and is in NAD. HEENT:  Normocephalic, atraumatic.  The mucous membranes are moist. The superficial temporal arteries are without ropiness or tenderness. CV:  RRR Lungs:  CTAB Neck/HEME:  There are no carotid bruits bilaterally.  Neurological examination:  Orientation: The patient is alert and oriented x3.  Cranial nerves: There is good facial symmetry. 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  Pupils are equal round and reactive to light bilaterally.  The speech is fluent and clear.  He is hypophonic.  The patient is able to make the gutteral sounds without difficulty. Soft palate rises symmetrically and there is no tongue deviation. Hearing is intact to conversational tone. Sensation: Sensation is intact to light touch throughout Motor: Strength is 5/5 in the bilateral upper and lower extremities.   Shoulder shrug is equal and symmetric.  There is no pronator drift.   Movement examination: Tone: There is normal tone bilaterally Abnormal movements: There is minor LUE tremor Coordination:  There is decremation with RAM's, seen with hand opening and closing on the left, finger taps on the left, alternation of supination/pronation on the left, heel taps and toe taps on the left. Gait and Station: The patient has no difficulty arising out of a deep-seated chair without the use of the hands.   The patient's stride length is decreased with decreased arm swing bilaterally (mild).    His pull test was negative today.  He is slow to jog down the hall.    ASSESSMENT/PLAN:  1.  Parkinsonism  - Continue requip 4mg /2mg /2mg .  Thinks tremor is improved.  -he is still in ACT classes with scholarship program and congratulated him on that  -asked me about cough and told him unsure of relationship.    -asks about swollen ankles.  Could be due to requip.  Wife does state that it is better (worsened after ate thanksgiving [place]) and not noted on examination today 2  Depression  -The patient reports that  this has been "resolved."  He is not suicidal or homicidal.  He will remain on Celexa and Wellbutrin and remain under the care of psychiatry. 3.  OSAS  - Had study on 05/11/16 but results STILL not in the system.  There is a scanned document but appear that these are just tech notes.  Seems that perhaps pt didn't sleep enough on the study to get results.  Don't see where AHI is even recorded.  Will have my MA contact sleep center and ask them about this. 4.  Sialorrhea  -refuses botox but has asked me multiple times about what to do about this.  Will let me know if changes mind  -offered robinul but doesn't want to to try it. 5.  Dizziness  -drinking plenty of fluid and markedly improved with that  -refuses compression stocking/refuses abdominal compression binder 6.  Memory loss  -He had repeat neuropsych testing on August 2 with a follow-up with Dr. Si Raider on August 10.  This was largely consistent with his performance one year prior.  There is mild cognitive impairment consistent with Parkinson's disease and untreated obstructive sleep apnea syndrome.  There was evidence of major depressive disorder, and Dr. Si Raider felt that there was long-standing personality issues that influenced the patient's view of the world in a negative  fashion.  There was not evidence of dementia at this point in time. 7.  REM behavior d/o  -just talking at night.  Doesn't want any medication.  Educated patient.  Will let me know if starts acting out the dreams/falling out of bed 7.  Urinary incontinence, frequency and nocturia  -following with [facility] and on toviaz and is helping 8.  Follow-up with me will be in the next 4 months, sooner should new neurologic issues arise.      Much greater than 50% of this visit was spent in counseling with the patient and the family.  Total face to face time:  35 min.

## 2016-09-07 ENCOUNTER — Ambulatory Visit: Payer: Federal, State, Local not specified - PPO | Attending: Neurology | Admitting: Physical Therapy

## 2016-09-07 ENCOUNTER — Encounter: Payer: Self-pay | Admitting: Physical Therapy

## 2016-09-07 ENCOUNTER — Ambulatory Visit: Payer: Self-pay | Admitting: Neurology

## 2016-09-07 ENCOUNTER — Ambulatory Visit: Payer: Federal, State, Local not specified - PPO | Admitting: Occupational Therapy

## 2016-09-07 ENCOUNTER — Ambulatory Visit: Payer: Federal, State, Local not specified - PPO

## 2016-09-07 DIAGNOSIS — R2681 Unsteadiness on feet: Secondary | ICD-10-CM

## 2016-09-07 DIAGNOSIS — R2689 Other abnormalities of gait and mobility: Secondary | ICD-10-CM

## 2016-09-07 DIAGNOSIS — R269 Unspecified abnormalities of gait and mobility: Secondary | ICD-10-CM | POA: Diagnosis present

## 2016-09-07 DIAGNOSIS — R29898 Other symptoms and signs involving the musculoskeletal system: Secondary | ICD-10-CM | POA: Insufficient documentation

## 2016-09-07 DIAGNOSIS — R4701 Aphasia: Secondary | ICD-10-CM | POA: Insufficient documentation

## 2016-09-07 DIAGNOSIS — R29818 Other symptoms and signs involving the nervous system: Secondary | ICD-10-CM

## 2016-09-07 DIAGNOSIS — R471 Dysarthria and anarthria: Secondary | ICD-10-CM | POA: Diagnosis present

## 2016-09-07 DIAGNOSIS — R293 Abnormal posture: Secondary | ICD-10-CM | POA: Insufficient documentation

## 2016-09-07 DIAGNOSIS — R278 Other lack of coordination: Secondary | ICD-10-CM | POA: Insufficient documentation

## 2016-09-07 DIAGNOSIS — R41841 Cognitive communication deficit: Secondary | ICD-10-CM | POA: Diagnosis present

## 2016-09-07 NOTE — Therapy (Signed)
[facility] [address] [place], [place], [address] Phone: [phone]   Fax:  [phone]  Occupational Therapy Treatment  Patient Details  Name: [name] [name] MRN: [id number] Date of Birth: 05-03-1951 Referring Provider: Dr. Carles Collet  Encounter Date: 09/07/2016      OT End of Session - 09/07/16 1417    Visit Number 3   Number of Visits 13   Authorization - Visit Number 3   Authorization - Number of Visits 11   OT Start Time [id number]   OT Stop Time [id number]   OT Time Calculation (min) 40 min      Past Medical History:  Diagnosis Date  .  OSA (obstructive sleep apnea) 01/17/2011   npsg 2012:  AHI 67/hr. Auto titration 2012:  Optimal pressure 12cm.   . APPENDECTOMY, HX OF 02/18/2008   Qualifier: Diagnosis of  By: [place], [place]    . Cardiac murmur    as a child  . Depression   . Headache(784.0)   . HEADACHES, HX OF 02/18/2008   Qualifier: Diagnosis of  By: Danny Lawless CMA, [place]    . Parkinson disease ([place]) 11/2014  . Streptococcal meningitis    as an infant    Past Surgical History:  Procedure Laterality Date  . APPENDECTOMY  1967  . NASAL SINUS SURGERY     x 4 as a child  . VASECTOMY      There were no vitals filed for this visit.            Discussion regarding activities that are challenging for pt. Pt reports difficulty navigating steps, getting in/ out of a car, standing from a chair with no arms, carrying items when walking and donning/ doffing a jacket. Pt demonstrates cognitive/ short term memory deficits which impact his performance of daily activities. Pt reports having an FCE yesterday,  Therapist discussed with pt that the PT had requested OT input regarding ADLS for the form that pt had requested to have filled out. Therapist asked if MD could be referred back to OT eval and progress notes, pt was in agreement.  Pt practiced sit to stand with PWR! Up and scooting a chair up under the  table several times using adapted strategy. Pt returned demonstration after education.           PWR [facility]) - 09/07/16 1645    PWR! exercises Moves in [place]! Up x20   PWR! [employer]! Twist x20   PWR! Step x20  modified hands on mat   Comments min v.c. for postiioning, and large amplitude movements               OT Short Term Goals - 08/10/16 1053      OT SHORT TERM GOAL #1   Title Pt will be independent with PD specific  HEP    Time 3   Period Weeks   Status New     OT SHORT TERM GOAL #2   Title Pt will demonstrate improved fine motor coordination as evidenced by decreasing LUE 9 hole peg test to 40 secs or less.   Baseline RUE 24.85 secs, LUE 44.57 secs   Time 3   Period Weeks   Status New     OT SHORT TERM GOAL #3   Title Pt will verbalize understanding of cognitive compensation strategies/ways to promote cognitive skills prn.   Time 3   Period Weeks   Status New  OT Long Term Goals - 08/10/16 1056      OT LONG TERM GOAL #1   Title Pt will verbalize understanding of adapted strategies for ADLs/IADLs prn    Time 6   Period Weeks   Status New     OT LONG TERM GOAL #2   Title Pt will demonstrate improved functional standing balance as evidenced by increasing LUE standing functional reach to 10 inches or greater   Baseline RUE 10.5 in, LUE 8 in   Time 6   Period Weeks   Status New     OT LONG TERM GOAL #3   Title Pt will demonstrate ability to write a paragraph with 100% legibility and no significant decrease in letter size.   Baseline Pt reports letter size decreases when he writes a paragraph   Time 6   Period Weeks   Status New     OT LONG TERM GOAL #4   Title Pt will verbalize understanding of ways to prevent future PD-related complications and verbalize understanding of community resources.   Time 6   Period Weeks   Status New               Plan - 09/07/16 1426    Clinical Impression Statement (P)   Pt is progressing towards goals. Pt can benefit from continued skilled OT to reinforce PD specific HEP and adapted strategies for ADLs.   Rehab Potential (P)  Good   OT Frequency (P)  2x / week   OT Duration (P)  6 weeks   OT Treatment/Interventions (P)  Self-care/ADL training;Therapeutic exercise;Cognitive remediation/compensation;Visual/perceptual remediation/compensation;Neuromuscular education;Parrafin;Moist Heat;Fluidtherapy;Energy conservation;Therapeutic exercises;Patient/family education;Balance training;Therapeutic activities;Passive range of motion;Manual Therapy;DME and/or AE instruction;Ultrasound;Cryotherapy   Plan (P)  continue skilled OT, end date extended   Consulted and Agree with Plan of Care (P)  Patient      Patient will benefit from skilled therapeutic intervention in order to improve the following deficits and impairments:  (P) Decreased cognition, Impaired flexibility, Decreased mobility, Decreased coordination, Decreased endurance, Decreased range of motion, Decreased strength, Impaired UE functional use, Impaired tone, Impaired perceived functional ability, Decreased safety awareness, Difficulty walking, Decreased balance  Visit Diagnosis: Unsteadiness on feet  Other abnormalities of gait and mobility  Abnormality of gait  Other symptoms and signs involving the nervous system  Unsteadiness  Rigidity    Problem List Patient Active Problem List   Diagnosis Date Noted  . Fatigue 03/15/2016  . Parkinsonian features 09/15/2015  . ED (erectile dysfunction) 09/15/2015  . MDD (major depressive disorder), recurrent episode, severe ([place]) 02/10/2014  . Nonspecific abnormal electrocardiogram (ECG) (EKG) 02/07/2014  . Non-compliant behavior 02/03/2014  . Morton's neuroma of left foot 08/07/2013  . 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 Hyperglycemia 01/26/2012  . Hypogonadism male 01/24/2012  . Syncope and collapse 01/20/2012  .   OSA (obstructive sleep apnea) 01/17/2011  . CHEST TIGHTNESS 02/18/2008    Wyat Infinger 09/07/2016, 5:01 PM Theone Murdoch, OTR/L [phone] Phone: [phone] 5:01 PM 09/07/16 [facility] [address] [place], [place], [address] Phone: [phone]   Fax:  [phone]  Name: MOXLEY LAMPORT MRN: [id number] Date of Birth: 02-02-1951

## 2016-09-07 NOTE — Therapy (Signed)
[facility] [address] [place], [place], [address] Phone: [phone]   Fax:  [phone]  Speech Language Pathology Treatment  Patient Details  Name: [name] [name] MRN: [id number] Date of Birth: 30-Dec-1950 Referring Provider: Alonza Bogus, D.O.  Encounter Date: 09/07/2016      End of Session - 09/07/16 1532    Visit Number 3   Number of Visits 17   Date for SLP Re-Evaluation 10/28/16   Authorization - Visit Number 2   Authorization - Number of Visits 10   SLP Start Time [id number]   SLP Stop Time  [id number]   SLP Time Calculation (min) 44 min   Activity Tolerance Patient tolerated treatment well      Past Medical History:  Diagnosis Date  .  OSA (obstructive sleep apnea) 01/17/2011   npsg 2012:  AHI 67/hr. Auto titration 2012:  Optimal pressure 12cm.   . APPENDECTOMY, HX OF 02/18/2008   Qualifier: Diagnosis of  By: [place], [place]    . Cardiac murmur    as a child  . Depression   . Headache(784.0)   . HEADACHES, HX OF 02/18/2008   Qualifier: Diagnosis of  By: Danny Lawless CMA, [place]    . Parkinson disease ([place]) 11/2014  . Streptococcal meningitis    as an infant    Past Surgical History:  Procedure Laterality Date  . APPENDECTOMY  1967  . NASAL SINUS SURGERY     x 4 as a child  . VASECTOMY      There were no vitals filed for this visit.      Subjective Assessment - 09/07/16 1522    Subjective "I don't have that liquid in my throat today." Pt does not exhibit wet voice.   Currently in Pain? No/denies               ADULT SLP TREATMENT - 09/07/16 1525      General Information   Behavior/Cognition Alert;Cooperative;Pleasant mood     Treatment Provided   Treatment provided Cognitive-Linquistic     Pain Assessment   Pain Assessment No/denies pain     Cognitive-Linquistic Treatment   Treatment focused on Cognition;Dysarthria   Skilled Treatment SLP facilitated WNL conversational loudness  by pt producing loud /a/ - average 95dB with initial min verbal cues for loudness. SLP worked with pt's loudness in functional conversation-  68dB between stimuli. In semi-structured tasks pt maintained average of 68dB, which improved to 69dB with nonverbal cues, occasionally. SLP also told pt to perform pitch raise/fall exercises.      Assessment / Recommendations / Plan   Plan Continue with current plan of care     Progression Toward Goals   Progression toward goals Progressing toward goals          SLP Education - 09/07/16 1532    Education provided Yes   Education Details high-low exercises   Person(s) Educated Patient   Methods Explanation;Handout;Demonstration;Verbal cues   Comprehension Verbal cues required;Verbalized understanding;Returned demonstration          SLP Short Term Goals - 09/07/16 1534      SLP SHORT TERM GOAL #1   Title Pt will average loud /a/ of 88dB over 3 sessions   Time 3   Period Weeks   Status On-going     SLP SHORT TERM GOAL #2   Title Pt will average 70dB during structured speech tasks and simple conversation with rare min A over 3 sessions   Time 3  Period Weeks   Status On-going     SLP SHORT TERM GOAL #3   Title pt will demo labial HEP with rare min A over three sessions   Time 3   Period Weeks   Status On-going     SLP SHORT TERM GOAL #4   Title pt will demo compensations for anomia in 5 minutes simple conversation   Time 3   Period Weeks   Status On-going          SLP Long Term Goals - 09/07/16 1535      SLP LONG TERM GOAL #1   Title pt to maintain average 70dB in 15 minutes mod complex conversation outside [place] room over two sessions   Time 7   Period Weeks   Status On-going     SLP LONG TERM GOAL #2   Title pt will demo aphasia compenastions functionally in 8 minutes mod complex conversation over two sessions   Time 7   Period Weeks   Status On-going     SLP LONG TERM GOAL #3   Title pt will demo appropriate  breath support for 8 minutes mod complex conversation    Time 7   Period Weeks   Status On-going          Plan - 09/07/16 1534    Clinical Impression Statement Pt presents with reduced conversational loudness from dysarthria due to Parkinson's disease. Pt would cont to benefit from skilled ST addressing conversational loudness and other areas of language and cogntive-linguistics, as pt has reported dysnomic and working memory problems with verbal expression as well.    Speech Therapy Frequency 2x / week   Duration --  7 weeks   Treatment/Interventions Language facilitation;Internal/external aids;Compensatory techniques;SLP instruction and feedback;Multimodal communcation approach;Cognitive reorganization;Functional tasks;Cueing hierarchy;Patient/family education   Potential to Achieve Goals Good      Patient will benefit from skilled therapeutic intervention in order to improve the following deficits and impairments:   Dysarthria and anarthria  Aphasia  Cognitive communication deficit    Problem List Patient Active Problem List   Diagnosis Date Noted  . Fatigue 03/15/2016  . Parkinsonian features 09/15/2015  . ED (erectile dysfunction) 09/15/2015  . MDD (major depressive disorder), recurrent episode, severe ([place]) 02/10/2014  . Nonspecific abnormal electrocardiogram (ECG) (EKG) 02/07/2014  . Non-compliant behavior 02/03/2014  . Morton's neuroma of left foot 08/07/2013  . 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 Hyperglycemia 01/26/2012  . Hypogonadism male 01/24/2012  . Syncope and collapse 01/20/2012  .  OSA (obstructive sleep apnea) 01/17/2011  . CHEST TIGHTNESS 02/18/2008    Elizabeth Paulsen ,MS, CCC-SLP  09/07/2016, 3:35 PM  [facility] [address] [place], [place], [address] Phone: [phone]   Fax:  [phone]   Name: [name] [name] MRN: [id number] Date of  Birth: [birth year]

## 2016-09-07 NOTE — Patient Instructions (Signed)
Add five high-lows to your regimen of practice (loud "ah")

## 2016-09-07 NOTE — Therapy (Signed)
[facility] [address] [place], [place], [address] Phone: [phone]   Fax:  [phone]  Physical Therapy Treatment  Patient Details  Name: [name] [name] MRN: [id number] Date of Birth: Feb 16, 1951 Referring Provider: Alonza Bogus , DO  Encounter Date: 09/07/2016      PT End of Session - 09/07/16 1443    Visit Number 4   Number of Visits 17   Date for PT Re-Evaluation 10/09/16   Authorization Type [employer]-75 visit limit (14 visits already used from PT ending 02/2016)-GCODE   PT Start Time 1315   PT Stop Time 1400   PT Time Calculation (min) 45 min   Equipment Utilized During Treatment --  Pt refused to wear a gait belt   Activity Tolerance Patient tolerated treatment well;No increased pain   Behavior During Therapy WFL for tasks assessed/performed      Past Medical History:  Diagnosis Date  .  OSA (obstructive sleep apnea) 01/17/2011   npsg 2012:  AHI 67/hr. Auto titration 2012:  Optimal pressure 12cm.   . APPENDECTOMY, HX OF 02/18/2008   Qualifier: Diagnosis of  By: [place], [place]    . Cardiac murmur    as a child  . Depression   . Headache(784.0)   . HEADACHES, HX OF 02/18/2008   Qualifier: Diagnosis of  By: Danny Lawless CMA, [place]    . Parkinson disease ([place]) 11/2014  . Streptococcal meningitis    as an infant    Past Surgical History:  Procedure Laterality Date  . APPENDECTOMY  1967  . NASAL SINUS SURGERY     x 4 as a child  . VASECTOMY      There were no vitals filed for this visit.      Subjective Assessment - 09/07/16 1321    Subjective No falls since last visit. Pt hasn't been working on [employer] given to him, example standing PWR! moves but has gone to the gym and worked on stationary bike.                          [facility] Adult PT Treatment/Exercise - 09/07/16 0001      Ambulation/Gait   Ambulation/Gait Yes   Ambulation/Gait Assistance 5. Supervison Pt self  corrected hitting objest on right side. Cues for visual scanning, increased arm swing,   Ambulation Distance (Feet) 500 Feet   Assistive device None   Gait Pattern Step-through pattern;Decreased arm swing - right;Decreased arm swing - left;Decreased step length - right;Decreased step length - left;Decreased trunk rotation;Trunk flexed;Poor foot clearance - left;Poor foot clearance - right   Ambulation Surface Level;Indoor   Gait Comments Inclined gait on treadmill to simulate driveway, speed up to 1.17mph, 5 min, progressing from 2-1 UE support, cues to increase steplength, and for posture     Knee/Hip Exercises: Aerobic   Elliptical Level 5.0, 4 min for strengthening in incline position, cues for less UE support.           PWR [facility]) - 09/07/16 1358 Standing   PWR! Up x15   PWR! [address] [place]   PWR! Twist x15   PWR Step x15   Comments min cues for technique             PT Education - 09/07/16 1343    Education provided Yes   Education Details Pt brought folder of past HEPs, routine schedules, and walking programs.  Updated HEP to include walking program 53min 3x/day and  PWR! Moves in standing 1x/day 5 days/week.   Person(s) Educated Patient   Methods Explanation   Comprehension Verbalized understanding          PT Short Term Goals - 08/10/16 1239      PT SHORT TERM GOAL #1   Title Pt will be independent with HEP for balance, functional strengthening and gait.  TARGET 09/08/16   Time 4   Period Weeks   Status New     PT SHORT TERM GOAL #2   Title Pt will improve TUG score to less than or equal to 13.5 seconds for decreased fall risk.   Time 4   Period Weeks   Status New     PT SHORT TERM GOAL #3   Title Pt will improve single limb stance to at least 3 seconds on LLE for improved stair negotiation and obstacle negotiation.   Time 4   Period Weeks   Status New     PT SHORT TERM GOAL #4   Title Pt will negotiate 4 steps,carrying objects resembling boxes or laundry  that he would carry at home, step to pattern, with supervision.   Time 4   Period Weeks   Status New           PT Long Term Goals - 08/10/16 1244      PT LONG TERM GOAL #1   Title Pt will verbalize understanding of fall prevention/tips to reduce freezing with gait.  TARGET 10/09/16   Time 8   Period Weeks   Status New     PT LONG TERM GOAL #2   Title Pt will improve TUG cognitive to less than or equal to 15 seconds for decreased fall risk.   Time 8   Period Weeks   Status New     PT LONG TERM GOAL #3   Title Pt will improve MiniBESTest to at least 24/28 for decreased fall risk.   Time 8   Period Weeks   Status New     PT LONG TERM GOAL #4   Title Pt will negotiate at least 4 steps carrying objects simulating household items, step to pattern, modified independently no LOB.   Time 8   Period Weeks     PT LONG TERM GOAL #5   Title Pt will improved gait velocity to at least 2.62 ft/sec for improved gait efficiency and safety.   Time 8   Period Weeks   Status New               Plan - 09/07/16 1444    Clinical Impression Statement Progressed HEP to include PWR! Moves in standing and walking program.  Progressed gait training with inclined surface using treadmill; pt requiring at least 1 UE support and cues for posture and steplength.   PT Treatment/Interventions ADLs/Self Care Home Management;Functional mobility training;Gait training;Stair training;Therapeutic activities;Therapeutic exercise;Balance training;Neuromuscular re-education;Patient/family education   PT Next Visit Plan Review sitting and standing PWR! Moves; work on [employer] additions to HEP-forward step ups, forward step downs; work on Industrial/product designer with and without carrying objects   Consulted and Agree with Plan of Care Patient;Family member/caregiver   Family Member Consulted spouse      Patient will benefit from skilled therapeutic intervention in order to improve the following deficits and  impairments:  Abnormal gait, Decreased balance, Decreased mobility, Difficulty walking, Decreased strength, Postural dysfunction  Visit Diagnosis: Unsteadiness on feet  Other abnormalities of gait and mobility  Abnormality of gait  Other symptoms and  signs involving the nervous system     Problem List Patient Active Problem List   Diagnosis Date Noted  . Fatigue 03/15/2016  . Parkinsonian features 09/15/2015  . ED (erectile dysfunction) 09/15/2015  . MDD (major depressive disorder), recurrent episode, severe ([place]) 02/10/2014  . Nonspecific abnormal electrocardiogram (ECG) (EKG) 02/07/2014  . Non-compliant behavior 02/03/2014  . Morton's neuroma of left foot 08/07/2013  . 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 Hyperglycemia 01/26/2012  . Hypogonadism male 01/24/2012  . Syncope and collapse 01/20/2012  .  OSA (obstructive sleep apnea) 01/17/2011  . CHEST TIGHTNESS 02/18/2008    Bjorn Loser, PTA  09/07/16, 4:03 PM [facility] [address] [place], [place], [address] Phone: [phone]   Fax:  [phone]  Name: LONAS CALVER MRN: [id number] Date of Birth: 1951-07-07

## 2016-09-13 ENCOUNTER — Other Ambulatory Visit (INDEPENDENT_AMBULATORY_CARE_PROVIDER_SITE_OTHER): Payer: Federal, State, Local not specified - PPO

## 2016-09-13 ENCOUNTER — Encounter: Payer: Self-pay | Admitting: Internal Medicine

## 2016-09-13 ENCOUNTER — Ambulatory Visit (INDEPENDENT_AMBULATORY_CARE_PROVIDER_SITE_OTHER): Payer: Federal, State, Local not specified - PPO | Admitting: Internal Medicine

## 2016-09-13 VITALS — BP 122/62 | HR 76 | Temp 98.0°F | Resp 16 | Ht 74.0 in | Wt 285.0 lb

## 2016-09-13 DIAGNOSIS — H60391 Other infective otitis externa, right ear: Secondary | ICD-10-CM | POA: Diagnosis not present

## 2016-09-13 DIAGNOSIS — R7301 Impaired fasting glucose: Secondary | ICD-10-CM | POA: Diagnosis not present

## 2016-09-13 DIAGNOSIS — H6120 Impacted cerumen, unspecified ear: Secondary | ICD-10-CM | POA: Insufficient documentation

## 2016-09-13 DIAGNOSIS — Z23 Encounter for immunization: Secondary | ICD-10-CM

## 2016-09-13 DIAGNOSIS — H6121 Impacted cerumen, right ear: Secondary | ICD-10-CM

## 2016-09-13 DIAGNOSIS — H60399 Other infective otitis externa, unspecified ear: Secondary | ICD-10-CM | POA: Insufficient documentation

## 2016-09-13 DIAGNOSIS — I872 Venous insufficiency (chronic) (peripheral): Secondary | ICD-10-CM

## 2016-09-13 LAB — COMPREHENSIVE METABOLIC PANEL
ALBUMIN: 4.2 g/dL (ref 3.5–5.2)
ALT: 16 U/L (ref 0–53)
AST: 14 U/L (ref 0–37)
Alkaline Phosphatase: 58 U/L (ref 39–117)
BUN: 16 mg/dL (ref 6–23)
CALCIUM: 9.2 mg/dL (ref 8.4–10.5)
CHLORIDE: 105 meq/L (ref 96–112)
CO2: 28 mEq/L (ref 19–32)
CREATININE: 1.15 mg/dL (ref 0.40–1.50)
GFR: 68.23 mL/min (ref >60.00)
Glucose, Bld: 100 mg/dL — ABNORMAL HIGH (ref 70–99)
Potassium: 4.3 mEq/L (ref 3.5–5.1)
Sodium: 139 mEq/L (ref 135–145)
Total Bilirubin: 0.7 mg/dL (ref 0.2–1.2)
Total Protein: 7.3 g/dL (ref 6.0–8.3)

## 2016-09-13 LAB — CBC
HEMATOCRIT: 39.2 % (ref 39.0–52.0)
Hemoglobin: 13.2 g/dL (ref 13.0–17.0)
MCHC: 33.7 g/dL (ref 30.0–36.0)
MCV: 89.4 fl (ref 78.0–100.0)
PLATELETS: 274 10*3/uL (ref 150.0–400.0)
RBC: 4.38 Mil/uL (ref 4.22–5.81)
RDW: 13.3 % (ref 11.5–15.5)
WBC: 5.5 10*3/uL (ref 4.0–10.5)

## 2016-09-13 LAB — BRAIN NATRIURETIC PEPTIDE: PRO B NATRI PEPTIDE: 84 pg/mL (ref 0.0–100.0)

## 2016-09-13 LAB — HEMOGLOBIN A1C: HEMOGLOBIN A1C: 5.9 % (ref 4.6–6.5)

## 2016-09-13 MED ORDER — CARBAMIDE PEROXIDE 6.5 % OT SOLN: 5.0000 [drp] | Freq: Two times a day (BID) | OTIC | 0 refills | Status: DC

## 2016-09-13 NOTE — Progress Notes (Signed)
Pre visit review using our clinic review tool, if applicable. No additional management support is needed unless otherwise documented below in the visit note. 

## 2016-09-13 NOTE — Assessment & Plan Note (Signed)
Rx for debrox ear drops to use at home to soften the wax and remove.

## 2016-09-13 NOTE — Patient Instructions (Signed)
We have sent in the ear drops to use in the right ear to help remove the wax. Use about 5 drops in the each twice a day for the next 3-5 days.   We are checking the labs and will send you the results on mychart.

## 2016-09-13 NOTE — Progress Notes (Signed)
   Subjective:    Patient ID: [name] [name], male    DOB: 01-Dec-1950, 65 y.o.   MRN: [id number]  HPI The patient is a 65 YO man coming in for right ear itching and congested. Has affected his hearing some. He has tried cleaning it out at home without benefit. Denies significant nasal congestion or drainage. He does have chronic sinus problems but not worse lately. No fevers or chills.  He also has noticed that since starting new bladder medicine he is having some swelling in his ankles. This was worse over thanksgiving and has gradually improved. Did not hurt and no rash. His family noticed and was concerned. They asked the urologist and were told it was not related to medicine but they looked it up and this was listed as side effect.   Review of Systems  Constitutional: Positive for fatigue. Negative for activity change, appetite change, fever and unexpected weight change.  HENT: Positive for congestion, ear discharge and ear pain. Negative for postnasal drip, rhinorrhea, sinus pain, sinus pressure, sore throat and trouble swallowing.   Eyes: Negative.   Respiratory: Negative.   Cardiovascular: Positive for leg swelling. Negative for chest pain and palpitations.  Gastrointestinal: Negative.   Skin: Negative.       Objective:   Physical Exam  Constitutional: He is oriented to person, place, and time. He appears well-developed and well-nourished.  HENT:  Head: Normocephalic and atraumatic.  Right ear with thick hard wax, oropharynx with mild drainage, no nasal crusting.  Eyes: EOM are normal.  Neck: Normal range of motion.  Cardiovascular: Normal rate.   Pulmonary/Chest: Effort normal. No respiratory distress. He has no wheezes. He has no rales.  Abdominal: Soft.  Musculoskeletal: He exhibits edema.  1+ edema to mid shin bilaterally.  Neurological: He is alert and oriented to person, place, and time.  Skin: Skin is warm and dry.   Vitals:   09/13/16 0834  BP: 122/62  Pulse: 76   Resp: 16  Temp: 98 F (36.7 C)  TempSrc: Oral  SpO2: 96%  Weight: 285 lb (129.3 kg)  Height: 6\' 2"  (1.88 m)      Assessment & Plan:  Tdap given at visit.

## 2016-09-13 NOTE — Assessment & Plan Note (Signed)
Likely worse with thanksgiving. Could be related to recent medication although he is not able to tell us the name of this medicine today. Checking CMP, BNP to rule out other etiology.

## 2016-09-14 ENCOUNTER — Ambulatory Visit (INDEPENDENT_AMBULATORY_CARE_PROVIDER_SITE_OTHER): Payer: Federal, State, Local not specified - PPO | Admitting: Neurology

## 2016-09-14 ENCOUNTER — Encounter: Payer: Self-pay | Admitting: Neurology

## 2016-09-14 VITALS — BP 122/76 | HR 74 | Ht 74.0 in | Wt 285.0 lb

## 2016-09-14 DIAGNOSIS — F331 Major depressive disorder, recurrent, moderate: Secondary | ICD-10-CM

## 2016-09-14 DIAGNOSIS — R413 Other amnesia: Secondary | ICD-10-CM | POA: Diagnosis not present

## 2016-09-14 DIAGNOSIS — G20 Parkinson's disease: Secondary | ICD-10-CM

## 2016-09-15 ENCOUNTER — Ambulatory Visit: Payer: Federal, State, Local not specified - PPO | Admitting: Occupational Therapy

## 2016-09-15 ENCOUNTER — Encounter: Payer: Self-pay | Admitting: Physical Therapy

## 2016-09-15 ENCOUNTER — Ambulatory Visit: Payer: Federal, State, Local not specified - PPO

## 2016-09-15 ENCOUNTER — Ambulatory Visit: Payer: Federal, State, Local not specified - PPO | Admitting: Physical Therapy

## 2016-09-15 DIAGNOSIS — R29818 Other symptoms and signs involving the nervous system: Secondary | ICD-10-CM

## 2016-09-15 DIAGNOSIS — R29898 Other symptoms and signs involving the musculoskeletal system: Secondary | ICD-10-CM

## 2016-09-15 DIAGNOSIS — R278 Other lack of coordination: Secondary | ICD-10-CM

## 2016-09-15 DIAGNOSIS — R2681 Unsteadiness on feet: Secondary | ICD-10-CM | POA: Diagnosis not present

## 2016-09-15 DIAGNOSIS — R2689 Other abnormalities of gait and mobility: Secondary | ICD-10-CM

## 2016-09-15 DIAGNOSIS — R293 Abnormal posture: Secondary | ICD-10-CM

## 2016-09-15 DIAGNOSIS — R471 Dysarthria and anarthria: Secondary | ICD-10-CM

## 2016-09-15 DIAGNOSIS — R4701 Aphasia: Secondary | ICD-10-CM

## 2016-09-15 DIAGNOSIS — R41841 Cognitive communication deficit: Secondary | ICD-10-CM

## 2016-09-15 NOTE — Patient Instructions (Signed)
   5 EACH!! - TWICE EVERY DAY   LOUD "AH"    HI-LO --- LO-HI    ANGRY-SURPRISED-JOYFUL-SCARED faces

## 2016-09-15 NOTE — Patient Instructions (Addendum)
Single Leg - Eyes Open    Holding support, lift right leg while maintaining balance over other leg. Progress to removing hands from support surface for longer periods of time. Hold__15__ seconds. Repeat _3__ times per session. Do _1-_2__ sessions per day.  Copyright  VHI. All rights reserved.

## 2016-09-15 NOTE — Therapy (Signed)
[facility] [address] [place], [place], [address] Phone: [phone]   Fax:  [phone]  Speech Language Pathology Treatment  Patient Details  Name: [name] [name] MRN: [id number] Date of Birth: [birth year] Referring Provider: Alonza Bogus, D.O.  Encounter Date: 09/15/2016      End of Session - 09/15/16 1052    Visit Number 4   Number of Visits 17   Date for SLP Re-Evaluation 10/28/16   Authorization - Visit Number 3   Authorization - Number of Visits 10   SLP Start Time [id number]   SLP Stop Time  [id number]   SLP Time Calculation (min) 42 min   Activity Tolerance Patient tolerated treatment well      Past Medical History:  Diagnosis Date  .  OSA (obstructive sleep apnea) 01/17/2011   npsg 2012:  AHI 67/hr. Auto titration 2012:  Optimal pressure 12cm.   . APPENDECTOMY, HX OF 02/18/2008   Qualifier: Diagnosis of  By: [place], [place]    . Cardiac murmur    as a child  . Depression   . Headache(784.0)   . HEADACHES, HX OF 02/18/2008   Qualifier: Diagnosis of  By: Danny Lawless CMA, [place]    . Parkinson disease ([place]) 11/2014  . Streptococcal meningitis    as an infant    Past Surgical History:  Procedure Laterality Date  . APPENDECTOMY  1967  . NASAL SINUS SURGERY     x 4 as a child  . VASECTOMY      There were no vitals filed for this visit.      Subjective Assessment - 09/15/16 0949    Subjective Pt without notable congestion in pharynx.   Currently in Pain? No/denies               ADULT SLP TREATMENT - 09/15/16 0952      General Information   Behavior/Cognition Alert;Cooperative;Pleasant mood     Treatment Provided   Treatment provided Cognitive-Linquistic     Cognitive-Linquistic Treatment   Treatment focused on Cognition;Dysarthria   Skilled Treatment SLP facilitated pt's louder conversational volume and incr'd ,intonation and facial expression with conversation. Conversation prior  to loud /a/ was average 70dB until 9 minutes then tended to decr to upper 60sdB. Pt's loud /a/ avergae 91dB.  After loud /a/ simple to mod complex conversation of 7 minutes with average low 70sdB. SLP reviewed pitch variation and facial exercises for extremes (surprised, angry, joyful, scared)     Assessment / Recommendations / [place] with current plan of care     Progression Toward Goals   Progression toward goals Progressing toward goals          SLP Education - 09/15/16 1052    Education provided Yes   Education Details reviewed HEP (loud /a/, pitch, facial expressions)   Person(s) Educated Patient   Methods Explanation;Demonstration;Verbal cues   Comprehension Verbalized understanding;Verbal cues required;Need further instruction          SLP Short Term Goals - 09/15/16 1054      SLP SHORT TERM GOAL #1   Title Pt will average loud /a/ of 88dB over 3 sessions   Time 2   Period Weeks   Status On-going     SLP SHORT TERM GOAL #2   Title Pt will average 70dB during structured speech tasks and simple conversation with rare min A over 3 sessions   Time 2   Period Weeks   Status On-going  SLP SHORT TERM GOAL #3   Title pt will demo labial HEP with rare min A over three sessions   Time 2   Period Weeks   Status On-going     SLP SHORT TERM GOAL #4   Title pt will demo compensations for anomia in 5 minutes simple conversation   Time 2   Period Weeks   Status On-going          SLP Long Term Goals - 09/15/16 1054      SLP LONG TERM GOAL #1   Title pt to maintain average 70dB in 15 minutes mod complex conversation outside [place] room over two sessions   Time 6   Period Weeks   Status On-going     SLP LONG TERM GOAL #2   Title pt will demo aphasia compenastions functionally in 8 minutes mod complex conversation over two sessions   Time 6   Period Weeks   Status On-going     SLP LONG TERM GOAL #3   Title pt will demo appropriate breath support for  8 minutes mod complex conversation    Time 6   Period Weeks   Status On-going          Plan - 09/15/16 1053    Clinical Impression Statement Pt presents with reduced conversational loudness from dysarthria due to Parkinson's disease, but improving. Now, pt reduces volume after approx 10 mintues. Pt also now with HEP for facial expressions as well as pitch variations. Pt would cont to benefit from skilled ST addressing conversational loudness and other areas of language and cogntive-linguistics, as pt has reported dysnomic and working memory problems with verbal expression as well.    Speech Therapy Frequency 2x / week   Duration --  6 weeks   Treatment/Interventions Language facilitation;Internal/external aids;Compensatory techniques;SLP instruction and feedback;Multimodal communcation approach;Cognitive reorganization;Functional tasks;Cueing hierarchy;Patient/family education   Potential to Achieve Goals Good      Patient will benefit from skilled therapeutic intervention in order to improve the following deficits and impairments:   Dysarthria and anarthria  Aphasia  Cognitive communication deficit    Problem List Patient Active Problem List   Diagnosis Date Noted  . Cerumen impaction 09/13/2016  . Chronic venous insufficiency 09/13/2016  . Fatigue 03/15/2016  . Parkinsonian features 09/15/2015  . ED (erectile dysfunction) 09/15/2015  . MDD (major depressive disorder), recurrent episode, severe ([place]) 02/10/2014  . Nonspecific abnormal electrocardiogram (ECG) (EKG) 02/07/2014  . Non-compliant behavior 02/03/2014  . Morton's neuroma of left foot 08/07/2013  . 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 Hyperglycemia 01/26/2012  . Hypogonadism male 01/24/2012  . Syncope and collapse 01/20/2012  .  OSA (obstructive sleep apnea) 01/17/2011  . CHEST TIGHTNESS 02/18/2008    SCHINKE,CARL ,MS, CCC-SLP  09/15/2016, 10:55 AM  [facility] [address] [place], [place], [address] Phone: [phone]   Fax:  [phone]   Name: [name] [name] MRN: [id number] Date of Birth: 04-02-1951

## 2016-09-15 NOTE — Therapy (Signed)
[facility] [address] [place], [place], [address] Phone: [phone]   Fax:  [phone]  Occupational Therapy Treatment  Patient Details  Name: [name] [name] MRN: [id number] Date of Birth: 1950-12-21 Referring Provider: Dr. Carles Collet  Encounter Date: 09/15/2016      OT End of Session - 09/15/16 0906    Visit Number 4   Number of Visits 13   Date for OT Re-Evaluation 10/18/15   Authorization Type BCBS   Authorization Time Period 75 visit limit combined need to check to see how many visits used   Authorization - Visit Number 4   Authorization - Number of Visits 11   OT Start Time 0800   OT Stop Time 0845   OT Time Calculation (min) 45 min   Activity Tolerance Patient tolerated treatment well      Past Medical History:  Diagnosis Date  .  OSA (obstructive sleep apnea) 01/17/2011   npsg 2012:  AHI 67/hr. Auto titration 2012:  Optimal pressure 12cm.   . APPENDECTOMY, HX OF 02/18/2008   Qualifier: Diagnosis of  By: [place], [place]    . Cardiac murmur    as a child  . Depression   . Headache(784.0)   . HEADACHES, HX OF 02/18/2008   Qualifier: Diagnosis of  By: Danny Lawless CMA, [place]    . Parkinson disease ([place]) 11/2014  . Streptococcal meningitis    as an infant    Past Surgical History:  Procedure Laterality Date  . APPENDECTOMY  1967  . NASAL SINUS SURGERY     x 4 as a child  . VASECTOMY      There were no vitals filed for this visit.      Subjective Assessment - 09/15/16 0803    Subjective  I don't know why I do this, but when something is 45 degrees to one side, I turn all the way around to get it vs. just reaching across to get it   Limitations see Epic   Patient Stated Goals improved balance and ease with ADLs   Currently in Pain? No/denies                      OT Treatments/Exercises (OP) - 09/15/16 0001      ADLs   ADL Comments Pt had questions re: hallucinations and  medications. Explained to pt that some anti-depressants and anti-psychotics can cause hallucinations. Pt also had questions re: difficulty with sit to stand. Therapist gave suggestions and practiced with patient (including: scooting to edge of chair, rocking, counting, then PWR! Up to stand)      Neurological Re-education Exercises   Other Exercises 1 PWR! Supine basic 4 - Pt had difficulty and feeling of falling off bed with PWR! Rock to Rt side and had to modify. After modifications, pt reported better but still present. Pt also had max difficulty and required max cueing for sequencing during PWR! Step and may also need modifications - did not issue today d/t these difficulties.  Pt could benefit from further review before issueing.                   OT Short Term Goals - 08/10/16 1053      OT SHORT TERM GOAL #1   Title Pt will be independent with PD specific  HEP    Time 3   Period Weeks   Status New     OT SHORT TERM GOAL #2  Title Pt will demonstrate improved fine motor coordination as evidenced by decreasing LUE 9 hole peg test to 40 secs or less.   Baseline RUE 24.85 secs, LUE 44.57 secs   Time 3   Period Weeks   Status New     OT SHORT TERM GOAL #3   Title Pt will verbalize understanding of cognitive compensation strategies/ways to promote cognitive skills prn.   Time 3   Period Weeks   Status New           OT Long Term Goals - 08/10/16 1056      OT LONG TERM GOAL #1   Title Pt will verbalize understanding of adapted strategies for ADLs/IADLs prn    Time 6   Period Weeks   Status New     OT LONG TERM GOAL #2   Title Pt will demonstrate improved functional standing balance as evidenced by increasing LUE standing functional reach to 10 inches or greater   Baseline RUE 10.5 in, LUE 8 in   Time 6   Period Weeks   Status New     OT LONG TERM GOAL #3   Title Pt will demonstrate ability to write a paragraph with 100% legibility and no significant decrease  in letter size.   Baseline Pt reports letter size decreases when he writes a paragraph   Time 6   Period Weeks   Status New     OT LONG TERM GOAL #4   Title Pt will verbalize understanding of ways to prevent future PD-related complications and verbalize understanding of community resources.   Time 6   Period Weeks   Status New               Plan - 09/15/16 0908    Clinical Impression Statement Pt with increased difficulty in PWR! Supine Rock d/t questioning vestibular issues. Pt requires max cueing to perform correctly and for large amplitude movements.    Rehab Potential Good   OT Frequency 2x / week   OT Duration 6 weeks   OT Treatment/Interventions Self-care/ADL training;Therapeutic exercise;Cognitive remediation/compensation;Visual/perceptual remediation/compensation;Neuromuscular education;Parrafin;Moist Heat;Fluidtherapy;Energy conservation;Therapeutic exercises;Patient/family education;Balance training;Therapeutic activities;Passive range of motion;Manual Therapy;DME and/or AE instruction;Ultrasound;Cryotherapy   Plan Review and modify PWR! Supine before issueing as HEP, handwriting strategies   Consulted and Agree with Plan of Care Patient      Patient will benefit from skilled therapeutic intervention in order to improve the following deficits and impairments:  Decreased cognition, Impaired flexibility, Decreased mobility, Decreased coordination, Decreased endurance, Decreased range of motion, Decreased strength, Impaired UE functional use, Impaired tone, Impaired perceived functional ability, Decreased safety awareness, Difficulty walking, Decreased balance  Visit Diagnosis: Other symptoms and signs involving the nervous system  Other symptoms and signs involving the musculoskeletal system  Other lack of coordination  Other abnormalities of gait and mobility    Problem List Patient Active Problem List   Diagnosis Date Noted  . Cerumen impaction 09/13/2016  .  Chronic venous insufficiency 09/13/2016  . Fatigue 03/15/2016  . Parkinsonian features 09/15/2015  . ED (erectile dysfunction) 09/15/2015  . MDD (major depressive disorder), recurrent episode, severe ([place]) 02/10/2014  . Nonspecific abnormal electrocardiogram (ECG) (EKG) 02/07/2014  . Non-compliant behavior 02/03/2014  . Morton's neuroma of left foot 08/07/2013  . 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 Hyperglycemia 01/26/2012  . Hypogonadism male 01/24/2012  . Syncope and collapse 01/20/2012  .  OSA (obstructive sleep apnea) 01/17/2011  . CHEST TIGHTNESS 02/18/2008    Redmond Baseman  Wynetta Emery, OTR/L 09/15/2016, 9:10 AM  [facility] [address] [place], [place], [address] Phone: [phone]   Fax:  [phone]  Name: AUM TEER MRN: [id number] Date of Birth: 1951/01/09

## 2016-09-15 NOTE — Therapy (Signed)
[facility] [address] [place], [place], [address] Phone: [phone]   Fax:  [phone]  Physical Therapy Treatment  Patient Details  Name: [name] [name] MRN: [id number] Date of Birth: [birth year] Referring Provider: Alonza Bogus , DO  Encounter Date: 09/15/2016      PT End of Session - 09/15/16 0928    Visit Number 5   Number of Visits 17   Date for PT Re-Evaluation 10/09/16   Authorization Type [employer]-75 visit limit (14 visits already used from PT ending 02/2016)-GCODE   PT Start Time 0847   PT Stop Time 0925   PT Time Calculation (min) 38 min   Activity Tolerance Patient tolerated treatment well   Behavior During Therapy [facility] for tasks assessed/performed      Past Medical History:  Diagnosis Date  .  OSA (obstructive sleep apnea) 01/17/2011   npsg 2012:  AHI 67/hr. Auto titration 2012:  Optimal pressure 12cm.   . APPENDECTOMY, HX OF 02/18/2008   Qualifier: Diagnosis of  By: [place], [place]    . Cardiac murmur    as a child  . Depression   . Headache(784.0)   . HEADACHES, HX OF 02/18/2008   Qualifier: Diagnosis of  By: Danny Lawless CMA, [place]    . Parkinson disease ([place]) 11/2014  . Streptococcal meningitis    as an infant    Past Surgical History:  Procedure Laterality Date  . APPENDECTOMY  1967  . NASAL SINUS SURGERY     x 4 as a child  . VASECTOMY      There were no vitals filed for this visit.      Subjective Assessment - 09/15/16 0848    Subjective Pt has not tried to do HEP at home but is still going to the gym and does the bike at home.   Currently in Pain? Yes   Pain Score 3    Pain Location Back   Pain Orientation Lower   Pain Descriptors / Indicators Aching   Pain Onset More than a month ago   Pain Frequency Constant            [facility] PT Assessment - 09/15/16 0001      Timed Up and Go Test   TUG Normal TUG   Normal TUG (seconds) 14.5                      [facility] Adult PT Treatment/Exercise - 09/15/16 0001      Transfers   Transfers --   Transfer Cueing worked on Mining engineer: pt demonstrated good motor planning but moves slowly; cues for increased forward weight shift for sit to stands.  Multiple reps to practise.     Ambulation/Gait   Ambulation/Gait Yes   Ambulation/Gait Assistance 6: Modified independent (Device/Increase time)  working on increase UE swing and step length   Ambulation Distance (Feet) 300 Feet   Assistive device None   Gait Pattern Step-through pattern;Decreased arm swing - right;Decreased arm swing - left;Decreased step length - right;Decreased step length - left;Decreased trunk rotation;Trunk flexed;Poor foot clearance - left;Poor foot clearance - right   Ambulation Surface Level;Unlevel;Indoor;Outdoor;Paved;Grass   Stairs Yes   Stairs Assistance 5: Supervision   Stairs Assistance Details (indicate cue type and reason) Carrying box   Stair Management Technique No rails;Step to pattern;Alternating pattern   Number of Stairs 4  x2   Curb 6: Modified independent (Device/increase time)  Balance Exercises - 09/15/16 0908      Balance Exercises: Standing   SLS Eyes open;Intermittent upper extremity support;3 reps;15 secs           PT Education - 09/15/16 0908    Education provided Yes   Education Details Updated HEP fot SLS; discussed goals checked.   Person(s) Educated Patient   Methods Demonstration;Verbal cues;Handout   Comprehension Verbalized understanding;Returned demonstration;Need further instruction          PT Short Term Goals - 09/15/16 0929      PT SHORT TERM GOAL #1   Title Pt will be independent with HEP for balance, functional strengthening and gait.  TARGET 09/08/16   Baseline Pt performs consistenly bike at home and goes to gym but has not followed through with HEP from rehab, 09/15/16.   Time 4   Period Weeks   Status Partially Met      PT SHORT TERM GOAL #2   Title Pt will improve TUG score to less than or equal to 13.5 seconds for decreased fall risk.   Baseline TUG 14.5, 09/15/16.   Time 4   Period Weeks   Status Not Met     PT SHORT TERM GOAL #3   Title Pt will improve single limb stance to at least 3 seconds on LLE for improved stair negotiation and obstacle negotiation.   Baseline < 3 seconds on LLE, 09/15/16.   Time 4   Period Weeks   Status Not Met     PT SHORT TERM GOAL #4   Title Pt will negotiate 4 steps,carrying objects resembling boxes or laundry that he would carry at home, step to pattern, with supervision.   Baseline MEt, 09/15/16.   Time 4   Period Weeks   Status Achieved           PT Long Term Goals - 08/10/16 1244      PT LONG TERM GOAL #1   Title Pt will verbalize understanding of fall prevention/tips to reduce freezing with gait.  TARGET 10/09/16   Time 8   Period Weeks   Status New     PT LONG TERM GOAL #2   Title Pt will improve TUG cognitive to less than or equal to 15 seconds for decreased fall risk.   Time 8   Period Weeks   Status New     PT LONG TERM GOAL #3   Title Pt will improve MiniBESTest to at least 24/28 for decreased fall risk.   Time 8   Period Weeks   Status New     PT LONG TERM GOAL #4   Title Pt will negotiate at least 4 steps carrying objects simulating household items, step to pattern, modified independently no LOB.   Time 8   Period Weeks     PT LONG TERM GOAL #5   Title Pt will improved gait velocity to at least 2.62 ft/sec for improved gait efficiency and safety.   Time 8   Period Weeks   Status New               Plan - 09/15/16 1452    Clinical Impression Statement Addressed car transfer issues: pt demonstrates good motor planning although slow. Pt reports having less trouble with a larger car.  Gave pt cues to increase forward weight shift from sit to stand for greater  balance with getting out of car.  Checked STGs today see above for  details; pt did make functional progress with  stair negotiation and updated HEP to include LLE SLS.                                                    PT Treatment/Interventions ADLs/Self Care Home Management;Functional mobility training;Gait training;Stair training;Therapeutic activities;Therapeutic exercise;Balance training;Neuromuscular re-education;Patient/family education   PT Next Visit Plan Review sitting and standing PWR! Moves; work on [employer] additions to HEP-forward step ups, forward step downs; work on Industrial/product designer with and without carrying objects   Consulted and Agree with Plan of Care Patient;Family member/caregiver   Family Member Consulted spouse      Patient will benefit from skilled therapeutic intervention in order to improve the following deficits and impairments:  Abnormal gait, Decreased balance, Decreased mobility, Difficulty walking, Decreased strength, Postural dysfunction  Visit Diagnosis: Unsteadiness on feet  Other abnormalities of gait and mobility  Other symptoms and signs involving the nervous system  Abnormal posture     Problem List Patient Active Problem List   Diagnosis Date Noted  . Cerumen impaction 09/13/2016  . Chronic venous insufficiency 09/13/2016  . Fatigue 03/15/2016  . Parkinsonian features 09/15/2015  . ED (erectile dysfunction) 09/15/2015  . MDD (major depressive disorder), recurrent episode, severe ([place]) 02/10/2014  . Nonspecific abnormal electrocardiogram (ECG) (EKG) 02/07/2014  . Non-compliant behavior 02/03/2014  . Morton's neuroma of left foot 08/07/2013  . 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 Hyperglycemia 01/26/2012  . Hypogonadism male 01/24/2012  . Syncope and collapse 01/20/2012  .  OSA (obstructive sleep apnea) 01/17/2011  . CHEST TIGHTNESS 02/18/2008    Bjorn Loser, PTA  09/15/16, 3:03 PM  [facility] [address]  [place], [place], [address] Phone: [phone]   Fax:  [phone]  Name: [name] [name] MRN: [id number] Date of Birth: [birth year]

## 2016-09-16 ENCOUNTER — Telehealth: Payer: Self-pay | Admitting: Neurology

## 2016-09-16 ENCOUNTER — Ambulatory Visit: Payer: Federal, State, Local not specified - PPO | Admitting: Occupational Therapy

## 2016-09-16 ENCOUNTER — Ambulatory Visit: Payer: Federal, State, Local not specified - PPO | Admitting: Physical Therapy

## 2016-09-16 ENCOUNTER — Ambulatory Visit: Payer: Federal, State, Local not specified - PPO | Admitting: Speech Pathology

## 2016-09-16 DIAGNOSIS — R471 Dysarthria and anarthria: Secondary | ICD-10-CM

## 2016-09-16 DIAGNOSIS — R2681 Unsteadiness on feet: Secondary | ICD-10-CM

## 2016-09-16 DIAGNOSIS — R278 Other lack of coordination: Secondary | ICD-10-CM

## 2016-09-16 DIAGNOSIS — R29898 Other symptoms and signs involving the musculoskeletal system: Secondary | ICD-10-CM

## 2016-09-16 DIAGNOSIS — R4701 Aphasia: Secondary | ICD-10-CM

## 2016-09-16 DIAGNOSIS — R29818 Other symptoms and signs involving the nervous system: Secondary | ICD-10-CM

## 2016-09-16 DIAGNOSIS — R2689 Other abnormalities of gait and mobility: Secondary | ICD-10-CM

## 2016-09-16 NOTE — Therapy (Signed)
[facility] [address] [place], [place], [address] Phone: [phone]   Fax:  [phone]  Speech Language Pathology Treatment  Patient Details  Name: [name] [name] MRN: [id number] Date of Birth: 10-Aug-1951 Referring Provider: Alonza Bogus, D.O.  Encounter Date: 09/16/2016      End of Session - 09/16/16 1223    Visit Number 5   Number of Visits 17   Date for SLP Re-Evaluation 10/28/16   Authorization Type 75 total visits - pt had 42 visits+3 evals from [facility] 2017   Authorization - Visit Number 4   Authorization - Number of Visits 10   SLP Start Time [phone]   SLP Stop Time  1017   SLP Time Calculation (min) 43 min   Activity Tolerance Patient tolerated treatment well      Past Medical History:  Diagnosis Date  .  OSA (obstructive sleep apnea) 01/17/2011   npsg 2012:  AHI 67/hr. Auto titration 2012:  Optimal pressure 12cm.   . APPENDECTOMY, HX OF 02/18/2008   Qualifier: Diagnosis of  By: [place], [place]    . Cardiac murmur    as a child  . Depression   . Headache(784.0)   . HEADACHES, HX OF 02/18/2008   Qualifier: Diagnosis of  By: Danny Lawless CMA, [place]    . Parkinson disease ([place]) 11/2014  . Streptococcal meningitis    as an infant    Past Surgical History:  Procedure Laterality Date  . APPENDECTOMY  1967  . NASAL SINUS SURGERY     x 4 as a child  . VASECTOMY      There were no vitals filed for this visit.      Subjective Assessment - 09/16/16 0945    Subjective "I just saw [name] [name] yesterday - I have never come back to back before"               ADULT SLP TREATMENT - 09/16/16 0946      General Information   Behavior/Cognition Alert;Cooperative;Pleasant mood     Treatment Provided   Treatment provided Cognitive-Linquistic     Cognitive-Linquistic Treatment   Treatment focused on Cognition;Dysarthria   Skilled Treatment Facilitated improved conversational volume and  intonation with pitch glides and loud /a/ average of 90dB. Structured speech tasks of reading 4 words and stating which one doesn't go and why to increase volume in low cognitive task - average 70dB with rare min A.  Structured speech task to train pt in compensations for aphasia describing simple objects/animals for ST to guess - pt required usual questioning cues for accurate/salient descriptions. 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 Pt demonstrated facial expressions with occasional min A.     Assessment / Recommendations / Plan   Plan Continue with current plan of care     Progression Toward Goals   Progression toward goals Progressing toward goals          SLP Education - 09/16/16 1222    Education provided Yes   Education Details compensations for aphasia, bradyphrenia   Person(s) Educated Patient   Methods Explanation;Demonstration;Verbal cues;Handout   Comprehension Verbalized understanding;Returned demonstration          SLP Short Term Goals - 09/16/16 1223      SLP SHORT TERM GOAL #1   Title Pt will average loud /a/ of 88dB over 3 sessions   Baseline 09/16/16;  Time 2   Period Weeks   Status On-going     SLP SHORT TERM GOAL #2   Title Pt will average 70dB during structured speech tasks and simple conversation with rare min A over 3 sessions   Time 2   Period Weeks   Status On-going     SLP SHORT TERM GOAL #3   Title pt will demo labial HEP with rare min A over three sessions   Time 2   Period Weeks   Status On-going     SLP SHORT TERM GOAL #4   Title pt will demo compensations for anomia in 5 minutes simple conversation   Time 2   Period Weeks   Status On-going          SLP Long Term Goals - 09/16/16 1223      SLP LONG TERM GOAL #1   Title pt to maintain average 70dB in 15 minutes mod complex conversation outside [place] room over two sessions   Time 6   Period  Weeks   Status On-going     SLP LONG TERM GOAL #2   Title pt will demo aphasia compenastions functionally in 8 minutes mod complex conversation over two sessions   Time 6   Period Weeks   Status On-going     SLP LONG TERM GOAL #3   Title pt will demo appropriate breath support for 8 minutes mod complex conversation    Time 6   Period Weeks   Status On-going          Plan - 09/16/16 1222    Clinical Impression Statement Pt presents with reduced conversational loudness from dysarthria due to Parkinson's disease, but improving. Now, pt reduces volume after approx 10 mintues. Pt also now with HEP for facial expressions as well as pitch variations. Pt would cont to benefit from skilled ST addressing conversational loudness and other areas of language and cogntive-linguistics, as pt has reported dysnomic and working memory problems with verbal expression as well.    Speech Therapy Frequency 2x / week   Treatment/Interventions Language facilitation;Internal/external aids;Compensatory techniques;SLP instruction and feedback;Multimodal communcation approach;Cognitive reorganization;Functional tasks;Cueing hierarchy;Patient/family education   Potential to Achieve Goals Good   Consulted and Agree with Plan of Care Patient      Patient will benefit from skilled therapeutic intervention in order to improve the following deficits and impairments:   Dysarthria and anarthria  Aphasia    Problem List Patient Active Problem List   Diagnosis Date Noted  . Cerumen impaction 09/13/2016  . Chronic venous insufficiency 09/13/2016  . Fatigue 03/15/2016  . Parkinsonian features 09/15/2015  . ED (erectile dysfunction) 09/15/2015  . MDD (major depressive disorder), recurrent episode, severe ([place]) 02/10/2014  . Nonspecific abnormal electrocardiogram (ECG) (EKG) 02/07/2014  . Non-compliant behavior 02/03/2014  . Morton's neuroma of left foot 08/07/2013  . Attention deficit disorder without mention  of hyperactivity 03/13/2012  . Hyperglycemia 01/26/2012  . Hypogonadism male 01/24/2012  . Syncope and collapse 01/20/2012  .  OSA (obstructive sleep apnea) 01/17/2011  . CHEST TIGHTNESS 02/18/2008    Lovvorn, Annye Rusk MS, CCC-SLP 09/16/2016, 12:24 PM  [facility] [address] [place], [place], [address] Phone: [phone]   Fax:  [phone]   Name: TALLIE MARITATO MRN: [id number] Date of Birth: [birth year]

## 2016-09-16 NOTE — Telephone Encounter (Signed)
Patient's wife made aware.

## 2016-09-16 NOTE — Telephone Encounter (Signed)
Let pt/wife know that sleep study didn't show significant osas (AHI 5.6) BUT total sleep time was only 213 min and he had a lot of arousals and was awake for 161 minutes after sleep onset (may have been due to being at sleep center out of normal setting??).  Based on this study, however, doesn't look like sleep apnea should be big contributor to EDS and not sure why so sleepy during day.  Stay active every day.

## 2016-09-16 NOTE — Patient Instructions (Signed)
   Get the persons attention before you speak  Use eye contact and face the person you are speaking to  Be in close proximity to the person you are speaking to  Turn down any noise in the environment such as the TV, walk away from loud appliances, air conditioners, fans, dish washers etc  Practice HI LO and facial expressions in the mirror

## 2016-09-16 NOTE — Therapy (Signed)
[facility] [address] [place], [place], [address] Phone: [phone]   Fax:  [phone]  Occupational Therapy Treatment  Patient Details  Name: [name] [name] MRN: [id number] Date of Birth: 1951/09/20 Referring Provider: Dr. Carles Collet  Encounter Date: 09/16/2016      OT End of Session - 09/15/16 0906    Visit Number 4   Number of Visits 13   Date for OT Re-Evaluation 10/18/15   Authorization Type BCBS   Authorization Time Period 75 visit limit combined need to check to see how many visits used   Authorization - Visit Number 4   Authorization - Number of Visits 11   OT Start Time 0800   OT Stop Time 0845   OT Time Calculation (min) 45 min   Activity Tolerance Patient tolerated treatment well      Past Medical History:  Diagnosis Date  .  OSA (obstructive sleep apnea) 01/17/2011   npsg 2012:  AHI 67/hr. Auto titration 2012:  Optimal pressure 12cm.   . APPENDECTOMY, HX OF 02/18/2008   Qualifier: Diagnosis of  By: [place], [place]    . Cardiac murmur    as a child  . Depression   . Headache(784.0)   . HEADACHES, HX OF 02/18/2008   Qualifier: Diagnosis of  By: Danny Lawless CMA, [place]    . Parkinson disease ([place]) 11/2014  . Streptococcal meningitis    as an infant    Past Surgical History:  Procedure Laterality Date  . APPENDECTOMY  1967  . NASAL SINUS SURGERY     x 4 as a child  . VASECTOMY      There were no vitals filed for this visit.      Subjective Assessment - 09/15/16 0803    Subjective  I don't know why I do this, but when something is 45 degrees to one side, I turn all the way around to get it vs. just reaching across to get it   Limitations see Epic   Patient Stated Goals improved balance and ease with ADLs   Currently in Pain? No/denies       Treatment: PWR! Basic 4 standing 10 reps each , min v.c. And demonstration. Pt practiced opening/ closing a soda bottle using adapted strategy  following instruction by therapist, with improved performance.  Arm bike x 6 mins level 1 for conditioning. Pt signed a release of information so that his medical records/ notes can be turned over to his wife as needed.                       OT Education - 09/16/16 1213    Education provided Yes   Education Details memory compensations, ways to keep thinking skills sharp, standing PWR! basic 4   Person(s) Educated Patient   Methods Explanation;Demonstration;Verbal cues;Handout   Comprehension Verbalized understanding;Returned demonstration;Verbal cues required          OT Short Term Goals - 09/16/16 1211      OT SHORT TERM GOAL #1   Title Pt will be independent with PD specific  HEP    Time 3   Period Weeks   Status On-going     OT SHORT TERM GOAL #2   Title Pt will demonstrate improved fine motor coordination as evidenced by decreasing LUE 9 hole peg test to 40 secs or less.   Time 3   Period Weeks   Status On-going     OT SHORT TERM  GOAL #3   Title Pt will verbalize understanding of cognitive compensation strategies/ways to promote cognitive skills prn.   Time 3   Period Weeks   Status Achieved     OT SHORT TERM GOAL #4   Title ----------------------------------------------------------------------------------------------------------------------------------------------------   Status --           OT Long Term Goals - 08/10/16 1056      OT LONG TERM GOAL #1   Title Pt will verbalize understanding of adapted strategies for ADLs/IADLs prn    Time 6   Period Weeks   Status New     OT LONG TERM GOAL #2   Title Pt will demonstrate improved functional standing balance as evidenced by increasing LUE standing functional reach to 10 inches or greater   Baseline RUE 10.5 in, LUE 8 in   Time 6   Period Weeks   Status New     OT LONG TERM GOAL #3   Title Pt will demonstrate ability to write a paragraph with 100% legibility and no significant decrease  in letter size.   Baseline Pt reports letter size decreases when he writes a paragraph   Time 6   Period Weeks   Status New     OT LONG TERM GOAL #4   Title Pt will verbalize understanding of ways to prevent future PD-related complications and verbalize understanding of community resources.   Time 6   Period Weeks   Status New               Plan - 09/16/16 1209    Clinical Impression Statement Pt is progressing towards goals. Therapsit reviewed standing PWr! exercises with patient and reissued today.   Rehab Potential Good   OT Frequency 2x / week   OT Duration 6 weeks   OT Treatment/Interventions Self-care/ADL training;Therapeutic exercise;Cognitive remediation/compensation;Visual/perceptual remediation/compensation;Neuromuscular education;Parrafin;Moist Heat;Fluidtherapy;Energy conservation;Therapeutic exercises;Patient/family education;Balance training;Therapeutic activities;Passive range of motion;Manual Therapy;DME and/or AE instruction;Ultrasound;Cryotherapy   Plan Check STG's, coordination HEP, address adapted strategies for ADLs   OT Home Exercise Plan issued PWR! modified quadraped, standing   Consulted and Agree with Plan of Care Patient      Patient will benefit from skilled therapeutic intervention in order to improve the following deficits and impairments:  Decreased cognition, Impaired flexibility, Decreased mobility, Decreased coordination, Decreased endurance, Decreased range of motion, Decreased strength, Impaired UE functional use, Impaired tone, Impaired perceived functional ability, Decreased safety awareness, Difficulty walking, Decreased balance  Visit Diagnosis: Other symptoms and signs involving the nervous system  Other symptoms and signs involving the musculoskeletal system  Other lack of coordination  Other abnormalities of gait and mobility    Problem List Patient Active Problem List   Diagnosis Date Noted  . Cerumen impaction 09/13/2016   . Chronic venous insufficiency 09/13/2016  . Fatigue 03/15/2016  . Parkinsonian features 09/15/2015  . ED (erectile dysfunction) 09/15/2015  . MDD (major depressive disorder), recurrent episode, severe ([place]) 02/10/2014  . Nonspecific abnormal electrocardiogram (ECG) (EKG) 02/07/2014  . Non-compliant behavior 02/03/2014  . Morton's neuroma of left foot 08/07/2013  . 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 Hyperglycemia 01/26/2012  . Hypogonadism male 01/24/2012  . Syncope and collapse 01/20/2012  .  OSA (obstructive sleep apnea) 01/17/2011  . CHEST TIGHTNESS 02/18/2008    RINE,KATHRYN 09/16/2016, 12:22 PM  [facility] [address] [place], [place], [address] Phone: [phone]   Fax:  [phone]  Name: [name] [name] MRN: [id number] Date of Birth:  03/11/1951  

## 2016-09-16 NOTE — Patient Instructions (Signed)
Memory Compensation Strategies  1. Use "WARM" strategy.  W= write it down  A= associate it  R= repeat it  M= make a mental note  2.   You can keep a Memory Notebook.  Use a 3-ring notebook with sections for the following: calendar, important names and phone numbers,  medications, doctors' names/phone numbers, lists/reminders, and a section to journal what you did  each day.   3.    Use a calendar to write appointments down.  4.    Write yourself a schedule for the day.  This can be placed on the calendar or in a separate section of the Memory Notebook.  Keeping a  regular schedule can help memory.  5.    Use medication organizer with sections for each day or morning/evening pills.  You may need help loading it  6.    Keep a basket, or pegboard by the door.  Place items that you need to take out with you in the basket or on the pegboard.  You may also want to  include a message board for reminders.  7.    Use sticky notes.  Place sticky notes with reminders in a place where the task is performed.  For example: " turn off the  stove" placed by the stove, "lock the door" placed on the door at eye level, " take your medications" on  the bathroom mirror or by the place where you normally take your medications.  8.    Use alarms/timers.  Use while cooking to remind yourself to check on food or as a reminder to take your medicine, or as a  reminder to make a call, or as a reminder to perform another task, etc.    Keeping Thinking Skills Sharp: 1. Jigsaw puzzles 2. Card/board games 3. Talking on the phone/social events 4. Lumosity.com 5. Online games 6. Word serches/crossword puzzles 7.  Logic puzzles 8. Aerobic exercise (stationary bike) 9. Eating balanced diet (fruits & veggies) 10. Drink water 11. Try something new--new recipe, hobby 12. Crafts 13. Do a variety of activities that are challenging 14. Add cognitive activities to walking/exercising (think of animal/food/city with  each letter of the alphabet, counting backwards, thinking of as many vegetables as you can, etc.).--Only do this  If safe (no freezing/falls).   

## 2016-09-17 NOTE — Therapy (Signed)
[facility] [address] [place], [place], [address] Phone: [phone]   Fax:  [phone]  Physical Therapy Treatment  Patient Details  Name: [name] [name] MRN: [id number] Date of Birth: December 15, 1950 Referring Provider: Alonza Bogus , DO  Encounter Date: 09/16/2016      PT End of Session - 09/17/16 1717    Visit Number 6   Number of Visits 17   Date for PT Re-Evaluation 10/09/16   Authorization Type BC[employer]-75 visit limit (14 visits already used from PT ending 02/2016)-GCODE   PT Start Time 1019   PT Stop Time 1100   PT Time Calculation (min) 41 min   Activity Tolerance Patient tolerated treatment well   Behavior During Therapy [facility] for tasks assessed/performed      Past Medical History:  Diagnosis Date  .  OSA (obstructive sleep apnea) 01/17/2011   npsg 2012:  AHI 67/hr. Auto titration 2012:  Optimal pressure 12cm.   . APPENDECTOMY, HX OF 02/18/2008   Qualifier: Diagnosis of  By: [place], [place]    . Cardiac murmur    as a child  . Depression   . Headache(784.0)   . HEADACHES, HX OF 02/18/2008   Qualifier: Diagnosis of  By: Danny Lawless CMA, [place]    . Parkinson disease ([place]) 11/2014  . Streptococcal meningitis    as an infant    Past Surgical History:  Procedure Laterality Date  . APPENDECTOMY  1967  . NASAL SINUS SURGERY     x 4 as a child  . VASECTOMY      There were no vitals filed for this visit.      Subjective Assessment - 09/16/16 1024    Subjective Have not been doing exercises at home, but I do go to the gym and ride the bike.  Occasionally have had the sensation of "free falling" when I am rolling (like what happened yesterday in OT with supine rolling activities from R sidelying to midline)  Doesn't happen all the time.   Patient Stated Goals Pt's goals for therapy are to improve sense of balance and stair negotiation.   Currently in Pain? No/denies   Pain Onset More than a  month ago                         [facility] Adult PT Treatment/Exercise - 09/16/16 1025      Knee/Hip Exercises: Aerobic   Stepper Seated Stepper, Level 3, 4 extremities x 8 minutes  Cues for increased intensity >70 RPM       Neuro Re-education: Pt performs supine<>sidelying R and supine<>sidelying L x 2 reps, then full roll, sidelying R<>sidelying L x 2 reps.  (with assistance of Emily Parcell, PT)  No nystagmus noted.  No c/o symptoms.  Discussed vertigo versus motion insensitivity with need to practice rolling at home.         Balance Exercises - 09/17/16 1712      Balance Exercises: Standing   SLS Eyes open;Intermittent upper extremity support;3 reps;15 secs   Step Ups Forward;6 inch;Intermittent UE support  15 reps each, cues for upright posture   Other Standing Exercises Alternating step taps to 6" and 12" step for improved SLS, with UE support at rail.           PT Education - 09/17/16 1716    Education provided Yes   Education Details Discussed importance of HEP, ways to perform HEP throughout the day, connected  to functional activities (ie-do SLS while waiting coffee to brew, PWR! Moves standing after brushing teeth), discussed vertigo versus movement insensitivity with need to practice rolling at home   Person(s) Educated Patient   Methods Explanation;Demonstration   Comprehension Verbalized understanding;Verbal cues required          PT Short Term Goals - 09/15/16 0929      PT SHORT TERM GOAL #1   Title Pt will be independent with HEP for balance, functional strengthening and gait.  TARGET 09/08/16   Baseline Pt performs consistenly bike at home and goes to gym but has not followed through with HEP from rehab, 09/15/16.   Time 4   Period Weeks   Status Partially Met     PT SHORT TERM GOAL #2   Title Pt will improve TUG score to less than or equal to 13.5 seconds for decreased fall risk.   Baseline TUG 14.5, 09/15/16.   Time 4   Period Weeks    Status Not Met     PT SHORT TERM GOAL #3   Title Pt will improve single limb stance to at least 3 seconds on LLE for improved stair negotiation and obstacle negotiation.   Baseline < 3 seconds on LLE, 09/15/16.   Time 4   Period Weeks   Status Not Met     PT SHORT TERM GOAL #4   Title Pt will negotiate 4 steps,carrying objects resembling boxes or laundry that he would carry at home, step to pattern, with supervision.   Baseline MEt, 09/15/16.   Time 4   Period Weeks   Status Achieved           PT Long Term Goals - 08/10/16 1244      PT LONG TERM GOAL #1   Title Pt will verbalize understanding of fall prevention/tips to reduce freezing with gait.  TARGET 10/09/16   Time 8   Period Weeks   Status New     PT LONG TERM GOAL #2   Title Pt will improve TUG cognitive to less than or equal to 15 seconds for decreased fall risk.   Time 8   Period Weeks   Status New     PT LONG TERM GOAL #3   Title Pt will improve MiniBESTest to at least 24/28 for decreased fall risk.   Time 8   Period Weeks   Status New     PT LONG TERM GOAL #4   Title Pt will negotiate at least 4 steps carrying objects simulating household items, step to pattern, modified independently no LOB.   Time 8   Period Weeks     PT LONG TERM GOAL #5   Title Pt will improved gait velocity to at least 2.62 ft/sec for improved gait efficiency and safety.   Time 8   Period Weeks   Status New               Plan - 09/17/16 1718    Clinical Impression Statement Because of patient's reports of "free falling" sensation (which pt describes as similar to a previous bout of vertigo), with rolling (coming from R sidelying to supine) in OT session yesterday, PT followed up by assessing for positional vertigo.  With sidelying and rolling, no nystagmus noted, no symptoms reported.  Pt may be experiencing motion insensitivity and would benefit from habituation with rolling activities.  Did not review car transfers today  due to time constraints.  Pt will continue to benefit from further skilled  PT to address balance, gait, functional mobility (car transfers and stair negotiation)   PT Treatment/Interventions ADLs/Self Care Home Management;Functional mobility training;Gait training;Stair training;Therapeutic activities;Therapeutic exercise;Balance training;Neuromuscular re-education;Patient/family education   PT Next Visit Plan Work again on step ups and step downs, carrying items with stair negotiation; review car transfers.  Work on intensity of movement in general-PWR! Moves, with aerobic activity and with gait.   Consulted and Agree with Plan of Care Patient      Patient will benefit from skilled therapeutic intervention in order to improve the following deficits and impairments:  Abnormal gait, Decreased balance, Decreased mobility, Difficulty walking, Decreased strength, Postural dysfunction  Visit Diagnosis: Other abnormalities of gait and mobility  Unsteadiness on feet     Problem List Patient Active Problem List   Diagnosis Date Noted  . Cerumen impaction 09/13/2016  . Chronic venous insufficiency 09/13/2016  . Fatigue 03/15/2016  . Parkinsonian features 09/15/2015  . ED (erectile dysfunction) 09/15/2015  . MDD (major depressive disorder), recurrent episode, severe ([place]) 02/10/2014  . Nonspecific abnormal electrocardiogram (ECG) (EKG) 02/07/2014  . Non-compliant behavior 02/03/2014  . Morton's neuroma of left foot 08/07/2013  . 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 Hyperglycemia 01/26/2012  . Hypogonadism male 01/24/2012  . Syncope and collapse 01/20/2012  .  OSA (obstructive sleep apnea) 01/17/2011  . CHEST TIGHTNESS 02/18/2008    Cadell Gabrielson W. 09/17/2016, 5:23 PM  Frazier Butt., PT  [facility] [address] [place], [place], [address] Phone: [phone]   Fax:   [phone]  Name: MAITLAND LESIAK MRN: [id number] Date of Birth: 08-04-1951

## 2016-09-19 ENCOUNTER — Ambulatory Visit: Payer: Federal, State, Local not specified - PPO | Admitting: Physical Therapy

## 2016-09-19 ENCOUNTER — Ambulatory Visit: Payer: Federal, State, Local not specified - PPO | Admitting: Speech Pathology

## 2016-09-19 ENCOUNTER — Encounter: Payer: Self-pay | Admitting: Physical Therapy

## 2016-09-19 ENCOUNTER — Ambulatory Visit: Payer: Federal, State, Local not specified - PPO | Admitting: Occupational Therapy

## 2016-09-19 DIAGNOSIS — R293 Abnormal posture: Secondary | ICD-10-CM

## 2016-09-19 DIAGNOSIS — R2689 Other abnormalities of gait and mobility: Secondary | ICD-10-CM

## 2016-09-19 DIAGNOSIS — R2681 Unsteadiness on feet: Secondary | ICD-10-CM

## 2016-09-19 DIAGNOSIS — R278 Other lack of coordination: Secondary | ICD-10-CM

## 2016-09-19 DIAGNOSIS — R4701 Aphasia: Secondary | ICD-10-CM

## 2016-09-19 DIAGNOSIS — R29818 Other symptoms and signs involving the nervous system: Secondary | ICD-10-CM

## 2016-09-19 DIAGNOSIS — R471 Dysarthria and anarthria: Secondary | ICD-10-CM

## 2016-09-19 DIAGNOSIS — R29898 Other symptoms and signs involving the musculoskeletal system: Secondary | ICD-10-CM

## 2016-09-19 NOTE — Therapy (Signed)
[facility] [address] [place], [place], [address] Phone: [phone]   Fax:  [phone]  Speech Language Pathology Treatment  Patient Details  Name: ZIDANE ROSHELL MRN: [id number] Date of Birth: Jun 26, 1951 Referring Provider: Alonza Bogus, D.O.  Encounter Date: 09/19/2016      End of Session - 09/19/16 1147    Visit Number 6   Number of Visits 17   Date for SLP Re-Evaluation 10/28/16   Authorization - Visit Number 5   Authorization - Number of Visits 10   SLP Start Time [id number]   SLP Stop Time  1147   SLP Time Calculation (min) 45 min   Activity Tolerance Patient tolerated treatment well      Past Medical History:  Diagnosis Date  .  OSA (obstructive sleep apnea) 01/17/2011   npsg 2012:  AHI 67/hr. Auto titration 2012:  Optimal pressure 12cm.   . APPENDECTOMY, HX OF 02/18/2008   Qualifier: Diagnosis of  By: [place], [place]    . Cardiac murmur    as a child  . Depression   . Headache(784.0)   . HEADACHES, HX OF 02/18/2008   Qualifier: Diagnosis of  By: Danny Lawless CMA, [place]    . Parkinson disease ([place]) 11/2014  . Streptococcal meningitis    as an infant    Past Surgical History:  Procedure Laterality Date  . APPENDECTOMY  1967  . NASAL SINUS SURGERY     x 4 as a child  . VASECTOMY      There were no vitals filed for this visit.      Subjective Assessment - 09/19/16 1104    Subjective "I have been well"               ADULT SLP TREATMENT - 09/19/16 1105      General Information   Behavior/Cognition Alert;Cooperative;Pleasant mood     Treatment Provided   Treatment provided Cognitive-Linquistic     Cognitive-Linquistic Treatment   Treatment focused on Cognition;Dysarthria   Skilled Treatment Loud /a/ to recalibrate volume with average of 92dB - with initial hoarseness that subsides. Pitch glides with occasional min to mod A.  Structured speech tasks describing problem solving  scenes with average 68dB with ongoing. Simple conversation average 68dB with ongoing cues for loud volume and recalibration with "hey " and /a/.      Assessment / Recommendations / Plan   Plan Continue with current plan of care     Progression Toward Goals   Progression toward goals Progressing toward goals            SLP Short Term Goals - 09/19/16 1144      SLP SHORT TERM GOAL #1   Title Pt will average loud /a/ of 88dB over 3 sessions   Baseline 09/16/16;   Time 1   Period Weeks   Status On-going     SLP SHORT TERM GOAL #2   Title Pt will average 70dB during structured speech tasks and simple conversation with rare min A over 3 sessions   Time 1   Period Weeks   Status On-going     SLP SHORT TERM GOAL #3   Title pt will demo labial HEP with rare min A over three sessions   Time 1   Period Weeks   Status On-going     SLP SHORT TERM GOAL #4   Title pt will demo compensations for anomia in 5 minutes simple conversation   Time 1  Period Weeks   Status On-going          SLP Long Term Goals - 09/19/16 1146      SLP LONG TERM GOAL #1   Title pt to maintain average 70dB in 15 minutes mod complex conversation outside [place] room over two sessions   Time 5   Period Weeks   Status On-going     SLP LONG TERM GOAL #2   Title pt will demo aphasia compenastions functionally in 8 minutes mod complex conversation over two sessions   Time 5   Period Weeks   Status On-going     SLP LONG TERM GOAL #3   Title pt will demo appropriate breath support for 8 minutes mod complex conversation    Time 5   Period Weeks   Status On-going          Plan - 09/19/16 1143    Clinical Impression Statement Pt presents with reduced conversational loudness from dysarthria due to Parkinson's disease, but improving. Now, pt reduces volume after approx 10 mintues. Pt also now with HEP for facial expressions as well as pitch variations. Pt would cont to benefit from skilled ST addressing  conversational loudness and other areas of language and cogntive-linguistics, as pt has reported dysnomic and working memory problems with verbal expression as well.    Speech Therapy Frequency 2x / week   Treatment/Interventions Language facilitation;Internal/external aids;Compensatory techniques;SLP instruction and feedback;Multimodal communcation approach;Cognitive reorganization;Functional tasks;Cueing hierarchy;Patient/family education   Potential to Achieve Goals Good      Patient will benefit from skilled therapeutic intervention in order to improve the following deficits and impairments:   Dysarthria and anarthria  Aphasia    Problem List Patient Active Problem List   Diagnosis Date Noted  . Cerumen impaction 09/13/2016  . Chronic venous insufficiency 09/13/2016  . Fatigue 03/15/2016  . Parkinsonian features 09/15/2015  . ED (erectile dysfunction) 09/15/2015  . MDD (major depressive disorder), recurrent episode, severe ([place]) 02/10/2014  . Nonspecific abnormal electrocardiogram (ECG) (EKG) 02/07/2014  . Non-compliant behavior 02/03/2014  . Morton's neuroma of left foot 08/07/2013  . 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 Hyperglycemia 01/26/2012  . Hypogonadism male 01/24/2012  . Syncope and collapse 01/20/2012  .  OSA (obstructive sleep apnea) 01/17/2011  . CHEST TIGHTNESS 02/18/2008    Yariel Ferraris, Annye Rusk MS, CCC-SLP 09/19/2016, 11:48 AM  [facility] [address] [place], [place], [address] Phone: [phone]   Fax:  [phone]   Name: JAVARI NORDINE MRN: [id number] Date of Birth: March 03, 1951

## 2016-09-19 NOTE — Therapy (Addendum)
[facility] [address] [place], [place], [address] Phone: [phone]   Fax:  [phone]  Occupational Therapy Treatment  Patient Details  Name: [name] [name] MRN: [id number] Date of Birth: [birth year] Referring Provider: Dr. Carles Collet  Encounter Date: 09/19/2016      OT End of Session - 09/19/16 1053    Visit Number 6   Number of Visits 13   Date for OT Re-Evaluation 10/18/15   Authorization Type BCBS   Authorization Time Period 75 visit limit combined need to check to see how many visits used   Authorization - Visit Number 6   Authorization - Number of Visits 11   OT Start Time 1018   OT Stop Time 1100   OT Time Calculation (min) 42 min   Activity Tolerance Patient tolerated treatment well   Behavior During Therapy [facility] for tasks assessed/performed      Past Medical History:  Diagnosis Date  .  OSA (obstructive sleep apnea) 01/17/2011   npsg 2012:  AHI 67/hr. Auto titration 2012:  Optimal pressure 12cm.   . APPENDECTOMY, HX OF 02/18/2008   Qualifier: Diagnosis of  By: [place], [place]    . Cardiac murmur    as a child  . Depression   . Headache(784.0)   . HEADACHES, HX OF 02/18/2008   Qualifier: Diagnosis of  By: Danny Lawless CMA, [place]    . Parkinson disease ([place]) 11/2014  . Streptococcal meningitis    as an infant    Past Surgical History:  Procedure Laterality Date  . APPENDECTOMY  1967  . NASAL SINUS SURGERY     x 4 as a child  . VASECTOMY      There were no vitals filed for this visit.      Subjective Assessment - 09/19/16 1031    Patient Stated Goals improved balance and ease with ADLs   Currently in Pain? No/denies          Reviewed flipping and dealing cards with large amplitude movements with bilateral UE's, min-mod v.c. For large amplitude movements. Arm bike x 6 mins level 1 for conditioning, pt maintained greater than  40 rpm. Checked 9 hole peg test, see  goals.               PWR [facility]) - 09/19/16 1057    PWR! exercises Moves in standing   PWR! Up x20   PWR! [employer]! Twist x20   PWR Step x20   Comments min v.c. and demonstration for large amplitude movments and intensity               OT Short Term Goals - 09/19/16 1033      OT SHORT TERM GOAL #1   Title (P)  Pt will be independent with PD specific  HEP    Time (P)  3   Period (P)  Weeks   Status (P)  On-going     OT SHORT TERM GOAL #2   Title (P)  Pt will demonstrate improved fine motor coordination as evidenced by decreasing LUE 9 hole peg test to 40 secs or less.   Time (P)  3   Period (P)  Weeks   Status (P)  Achieved  31.25 secs     OT SHORT TERM GOAL #3   Title (P)  Pt will verbalize understanding of cognitive compensation strategies/ways to promote cognitive skills prn.   Time (P)  3   Period (P)  Weeks  Status (P)  Achieved     OT SHORT TERM GOAL #4   Title (P)  ----------------------------------------------------------------------------------------------------------------------------------------------------           OT Long Term Goals - 08/10/16 1056      OT LONG TERM GOAL #1   Title Pt will verbalize understanding of adapted strategies for ADLs/IADLs prn    Time 6   Period Weeks   Status New     OT LONG TERM GOAL #2   Title Pt will demonstrate improved functional standing balance as evidenced by increasing LUE standing functional reach to 10 inches or greater   Baseline RUE 10.5 in, LUE 8 in   Time 6   Period Weeks   Status New     OT LONG TERM GOAL #3   Title Pt will demonstrate ability to write a paragraph with 100% legibility and no significant decrease in letter size.   Baseline Pt reports letter size decreases when he writes a paragraph   Time 6   Period Weeks   Status New     OT LONG TERM GOAL #4   Title Pt will verbalize understanding of ways to prevent future PD-related complications and verbalize  understanding of community resources.   Time 6   Period Weeks   Status New               Plan - 09/19/16 1308    Clinical Impression Statement Pt is progressing towards goals. Pt demonstrates improved fine motor coordination as evidenced by improved 9 hole peg test. See goals.   Rehab Potential Good   OT Frequency 2x / week   OT Duration 6 weeks   OT Treatment/Interventions Self-care/ADL training;Therapeutic exercise;Cognitive remediation/compensation;Visual/perceptual remediation/compensation;Neuromuscular education;Parrafin;Moist Heat;Fluidtherapy;Energy conservation;Therapeutic exercises;Patient/family education;Balance training;Therapeutic activities;Passive range of motion;Manual Therapy;DME and/or AE instruction;Ultrasound;Cryotherapy   Plan review supine PWR!( check to see if pt falls dizzines/ feeling like he is falling) reissue coordination HEP   Consulted and Agree with Plan of Care Patient      Patient will benefit from skilled therapeutic intervention in order to improve the following deficits and impairments:  Decreased cognition, Impaired flexibility, Decreased mobility, Decreased coordination, Decreased endurance, Decreased range of motion, Decreased strength, Impaired UE functional use, Impaired tone, Impaired perceived functional ability, Decreased safety awareness, Difficulty walking, Decreased balance  Visit Diagnosis: Other symptoms and signs involving the nervous system  Other symptoms and signs involving the musculoskeletal system  Other lack of coordination  Other abnormalities of gait and mobility    Problem List Patient Active Problem List   Diagnosis Date Noted  . Cerumen impaction 09/13/2016  . Chronic venous insufficiency 09/13/2016  . Fatigue 03/15/2016  . Parkinsonian features 09/15/2015  . ED (erectile dysfunction) 09/15/2015  . MDD (major depressive disorder), recurrent episode, severe ([place]) 02/10/2014  . Nonspecific abnormal  electrocardiogram (ECG) (EKG) 02/07/2014  . Non-compliant behavior 02/03/2014  . Morton's neuroma of left foot 08/07/2013  . 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 Hyperglycemia 01/26/2012  . Hypogonadism male 01/24/2012  . Syncope and collapse 01/20/2012  .  OSA (obstructive sleep apnea) 01/17/2011  . CHEST TIGHTNESS 02/18/2008    Geroge Gilliam 09/19/2016, 1:11 PM  [facility] [address] [place], [place], [address] Phone: [phone]   Fax:  [phone]  Name: [name] [name] MRN: [id number] Date of Birth: Sep 02, 1951

## 2016-09-19 NOTE — Therapy (Signed)
[facility] [address] [place], [place], [address] Phone: [phone]   Fax:  [phone]  Physical Therapy Treatment  Patient Details  Name: [name] [name] MRN: [id number] Date of Birth: 1951-05-09 Referring Provider: Alonza Bogus , DO  Encounter Date: 09/19/2016      PT End of Session - 09/19/16 1020    Visit Number 7   Number of Visits 17   Date for PT Re-Evaluation 10/09/16   Authorization Type [employer]-75 visit limit (14 visits already used from PT ending 02/2016)-GCODE   PT Start Time 0939   PT Stop Time 1018   PT Time Calculation (min) 39 min   Activity Tolerance Patient tolerated treatment well   Behavior During Therapy [facility] for tasks assessed/performed      Past Medical History:  Diagnosis Date  .  OSA (obstructive sleep apnea) 01/17/2011   npsg 2012:  AHI 67/hr. Auto titration 2012:  Optimal pressure 12cm.   . APPENDECTOMY, HX OF 02/18/2008   Qualifier: Diagnosis of  By: [place], [place]    . Cardiac murmur    as a child  . Depression   . Headache(784.0)   . HEADACHES, HX OF 02/18/2008   Qualifier: Diagnosis of  By: Danny Lawless CMA, [place]    . Parkinson disease ([facility]) 11/2014  . Streptococcal meningitis    as an infant    Past Surgical History:  Procedure Laterality Date  . APPENDECTOMY  1967  . NASAL SINUS SURGERY     x 4 as a child  . VASECTOMY      There were no vitals filed for this visit.      Subjective Assessment - 09/19/16 0942    Subjective The hardest thing still is getting up out of a chair after sitting more than 10 min.   Patient Stated Goals Pt's goals for therapy are to improve sense of balance and stair negotiation.   Currently in Pain? No/denies   Pain Onset More than a month ago                           Balance Exercises - 09/19/16 0945      Balance Exercises: Standing   Standing Eyes Opened Wide (BOA);Head turns;Foam/compliant surface   + Alt UE raises + Mini squats + Upper trunk rotations ( x10 ea) cues for balance reactions.   SLS Eyes open  tapping cones, multidirections,   Stepping Strategy Foam/compliant surface;Anterior;Posterior  stepping over and back foam beam, cues for controlled foot placement   Sit to Stand Time simulated car transfer with low seat and seated PWR! Step then stand<>sit   Other Standing Exercises mutlidirectional stepping with weight shifts on solid surface, cues for upright gaze; self corrected imbalance.             PT Short Term Goals - 09/15/16 0929      PT SHORT TERM GOAL #1   Title Pt will be independent with HEP for balance, functional strengthening and gait.  TARGET 09/08/16   Baseline Pt performs consistenly bike at home and goes to gym but has not followed through with HEP from rehab, 09/15/16.   Time 4   Period Weeks   Status Partially Met     PT SHORT TERM GOAL #2   Title Pt will improve TUG score to less than or equal to 13.5 seconds for decreased fall risk.   Baseline TUG 14.5, 09/15/16.   Time  4   Period Weeks   Status Not Met     PT SHORT TERM GOAL #3   Title Pt will improve single limb stance to at least 3 seconds on LLE for improved stair negotiation and obstacle negotiation.   Baseline < 3 seconds on LLE, 09/15/16.   Time 4   Period Weeks   Status Not Met     PT SHORT TERM GOAL #4   Title Pt will negotiate 4 steps,carrying objects resembling boxes or laundry that he would carry at home, step to pattern, with supervision.   Baseline MEt, 09/15/16.   Time 4   Period Weeks   Status Achieved           PT Long Term Goals - 08/10/16 1244      PT LONG TERM GOAL #1   Title Pt will verbalize understanding of fall prevention/tips to reduce freezing with gait.  TARGET 10/09/16   Time 8   Period Weeks   Status New     PT LONG TERM GOAL #2   Title Pt will improve TUG cognitive to less than or equal to 15 seconds for decreased fall risk.   Time 8   Period  Weeks   Status New     PT LONG TERM GOAL #3   Title Pt will improve MiniBESTest to at least 24/28 for decreased fall risk.   Time 8   Period Weeks   Status New     PT LONG TERM GOAL #4   Title Pt will negotiate at least 4 steps carrying objects simulating household items, step to pattern, modified independently no LOB.   Time 8   Period Weeks     PT LONG TERM GOAL #5   Title Pt will improved gait velocity to at least 2.62 ft/sec for improved gait efficiency and safety.   Time 8   Period Weeks   Status New               Plan - 09/19/16 1126    Clinical Impression Statement Continued to work on car transfer simulated indoors; pt performs at supervision level but cues given for greater weight shift forward with sit to stand.  Worked on standing balance on compliant surface with static, dynamic, and step training; pt continues to require UE support.                                             PT Treatment/Interventions ADLs/Self Care Home Management;Functional mobility training;Gait training;Stair training;Therapeutic activities;Therapeutic exercise;Balance training;Neuromuscular re-education;Patient/family education   PT Next Visit Plan Work again on step ups and step downs, carrying items with stair negotiation; review car transfers.  Work on intensity of movement in general-PWR! Moves, with aerobic activity and with gait.   Consulted and Agree with Plan of Care Patient      Patient will benefit from skilled therapeutic intervention in order to improve the following deficits and impairments:  Abnormal gait, Decreased balance, Decreased mobility, Difficulty walking, Decreased strength, Postural dysfunction  Visit Diagnosis: No diagnosis found.     Problem List Patient Active Problem List   Diagnosis Date Noted  . Cerumen impaction 09/13/2016  . Chronic venous insufficiency 09/13/2016  . Fatigue 03/15/2016  . Parkinsonian features 09/15/2015  . ED (erectile  dysfunction) 09/15/2015  . MDD (major depressive disorder), recurrent episode, severe ([place]) 02/10/2014  . Nonspecific abnormal  electrocardiogram (ECG) (EKG) 02/07/2014  . Non-compliant behavior 02/03/2014  . Morton's neuroma of left foot 08/07/2013  . 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 Hyperglycemia 01/26/2012  . Hypogonadism male 01/24/2012  . Syncope and collapse 01/20/2012  .  OSA (obstructive sleep apnea) 01/17/2011  . CHEST TIGHTNESS 02/18/2008    Bjorn Loser, PTA  09/19/16, 11:37 AM [facility] [address] [place], [place], [address] Phone: [phone]   Fax:  [phone]  Name: [name] [name] MRN: [id number] Date of Birth: April 07, 1951

## 2016-09-22 ENCOUNTER — Ambulatory Visit: Payer: Federal, State, Local not specified - PPO | Admitting: Physical Therapy

## 2016-09-22 ENCOUNTER — Ambulatory Visit: Payer: Federal, State, Local not specified - PPO | Admitting: Speech Pathology

## 2016-09-22 ENCOUNTER — Ambulatory Visit: Payer: Federal, State, Local not specified - PPO | Admitting: Occupational Therapy

## 2016-09-22 VITALS — BP 125/80

## 2016-09-22 DIAGNOSIS — R2681 Unsteadiness on feet: Secondary | ICD-10-CM | POA: Diagnosis not present

## 2016-09-22 DIAGNOSIS — R293 Abnormal posture: Secondary | ICD-10-CM

## 2016-09-22 DIAGNOSIS — R2689 Other abnormalities of gait and mobility: Secondary | ICD-10-CM

## 2016-09-22 DIAGNOSIS — R29898 Other symptoms and signs involving the musculoskeletal system: Secondary | ICD-10-CM

## 2016-09-22 DIAGNOSIS — R278 Other lack of coordination: Secondary | ICD-10-CM

## 2016-09-22 DIAGNOSIS — R29818 Other symptoms and signs involving the nervous system: Secondary | ICD-10-CM

## 2016-09-22 DIAGNOSIS — R471 Dysarthria and anarthria: Secondary | ICD-10-CM

## 2016-09-22 NOTE — Therapy (Signed)
[facility] [address] [place], [place], [address] Phone: [phone]   Fax:  [phone]  Physical Therapy Treatment  Patient Details  Name: [name] [name] MRN: [id number] Date of Birth: [birth year] Referring Provider: Alonza Bogus , DO  Encounter Date: 09/22/2016      PT End of Session - 09/22/16 0958    Visit Number 8   Number of Visits 17   Date for PT Re-Evaluation 10/09/16   Authorization Type BC[employer]-75 visit limit (14 visits already used from PT ending 02/2016)-GCODE   PT Start Time 0849   PT Stop Time 0932   PT Time Calculation (min) 43 min   Activity Tolerance Other (comment)  Limited due to c/o visual spinning/blurriness complaints   Behavior During Therapy [facility] for tasks assessed/performed      Past Medical History:  Diagnosis Date  .  OSA (obstructive sleep apnea) 01/17/2011   npsg 2012:  AHI 67/hr. Auto titration 2012:  Optimal pressure 12cm.   . APPENDECTOMY, HX OF 02/18/2008   Qualifier: Diagnosis of  By: [place], [place]    . Cardiac murmur    as a child  . Depression   . Headache(784.0)   . HEADACHES, HX OF 02/18/2008   Qualifier: Diagnosis of  By: Danny Lawless CMA, [place]    . Parkinson disease ([place]) 11/2014  . Streptococcal meningitis    as an infant    Past Surgical History:  Procedure Laterality Date  . APPENDECTOMY  1967  . NASAL SINUS SURGERY     x 4 as a child  . VASECTOMY      Vitals:   09/22/16 0854  BP: 125/80        Subjective Assessment - 09/22/16 0854    Subjective When I looked at you while you were talking-your head appears as if it was spinning.  It went away after a few seconds.  Sometimes have a little blurred vision.   Patient Stated Goals Pt's goals for therapy are to improve sense of balance and stair negotiation.   Currently in Pain? Yes   Pain Score 1    Pain Location Foot   Pain Orientation Right   Pain Descriptors / Indicators --  annoyance   Pain Type Acute pain   Pain Onset 1 to 4 weeks ago   Pain Frequency Intermittent   Aggravating Factors  walking on hard surfaces barefooted   Pain Relieving Factors no problems with walking in shoes          Neuro Re-education:       Vestibular Assessment - 09/22/16 0001      Vestibular Assessment   General Observation Upon sitting at beginning of session, pt c/o therapist facing "spinning", with blurred vision in background     Occulomotor Exam   Occulomotor Alignment Abnormal  Pt holds head tilted slightly toward left side   Smooth Pursuits Saccades  at end range horizontal R  greater than L   Saccades Slow     Vestibulo-Occular Reflex   VOR 1 Head Only (x 1 viewing) attempted, but pt c/o double vision.  Has difficulty with horizontal direction to R; overall decreased range     Visual Acuity   Static Line 8   Dynamic Line 7                 [facility] Adult PT Treatment/Exercise - 09/22/16 0001      Transfers   Transfers Sit to Stand;Stand to Sit  Sit to Stand 6: Modified independent (Device/Increase time);From chair/3-in-1;Without upper extremity assist   Stand to Sit 6: Modified independent (Device/Increase time);Without upper extremity assist;To chair/3-in-1   Number of Reps 10 reps  from mat surface   Comments Upon standing prior to gait, had patient practice wide BOS weigthshifting as a means to improve gait initiation.     Ambulation/Gait   Ambulation/Gait Yes   Ambulation/Gait Assistance 6: Modified independent (Device/Increase time)   Ambulation Distance (Feet) 400 Feet  then 200 ft   Assistive device None   Gait Pattern Step-through pattern;Decreased arm swing - right;Decreased arm swing - left;Decreased step length - right;Decreased step length - left;Decreased trunk rotation;Trunk flexed;Poor foot clearance - left;Poor foot clearance - right  Cues for increased arm swing, incr. step length   Ambulation Surface Level;Indoor   Stairs Yes   Stairs  Assistance 5: Supervision   Stairs Assistance Details (indicate cue type and reason) Carrying bilateral weights to simulate groceries, then carrying 6# weighted box   Stair Management Technique No rails;Step to pattern  Alternating pattern with one rail (when not carrying items)   Number of Stairs 4  8 reps   Height of Stairs 6   Gait Comments Gait activities with head turns/head nods to look for objects, for improved environmental scanning.  Pt does not c/o dizziness, blurred vision.                PT Education - 09/22/16 0955    Education provided Yes   Education Details Discussed pt's c/o vision spinning sensation, blurred vision, overall slowed horizontal eye movements.  Discussed motion insensitivity and likely need to include head movements and visual targets into exercise and activities.   Person(s) Educated Patient   Methods Explanation   Comprehension Verbalized understanding          PT Short Term Goals - 09/15/16 0929      PT SHORT TERM GOAL #1   Title Pt will be independent with HEP for balance, functional strengthening and gait.  TARGET 09/08/16   Baseline Pt performs consistenly bike at home and goes to gym but has not followed through with HEP from rehab, 09/15/16.   Time 4   Period Weeks   Status Partially Met     PT SHORT TERM GOAL #2   Title Pt will improve TUG score to less than or equal to 13.5 seconds for decreased fall risk.   Baseline TUG 14.5, 09/15/16.   Time 4   Period Weeks   Status Not Met     PT SHORT TERM GOAL #3   Title Pt will improve single limb stance to at least 3 seconds on LLE for improved stair negotiation and obstacle negotiation.   Baseline < 3 seconds on LLE, 09/15/16.   Time 4   Period Weeks   Status Not Met     PT SHORT TERM GOAL #4   Title Pt will negotiate 4 steps,carrying objects resembling boxes or laundry that he would carry at home, step to pattern, with supervision.   Baseline MEt, 09/15/16.   Time 4   Period  Weeks   Status Achieved           PT Long Term Goals - 08/10/16 1244      PT LONG TERM GOAL #1   Title Pt will verbalize understanding of fall prevention/tips to reduce freezing with gait.  TARGET 10/09/16   Time 8   Period Weeks   Status New  PT LONG TERM GOAL #2   Title Pt will improve TUG cognitive to less than or equal to 15 seconds for decreased fall risk.   Time 8   Period Weeks   Status New     PT LONG TERM GOAL #3   Title Pt will improve MiniBESTest to at least 24/28 for decreased fall risk.   Time 8   Period Weeks   Status New     PT LONG TERM GOAL #4   Title Pt will negotiate at least 4 steps carrying objects simulating household items, step to pattern, modified independently no LOB.   Time 8   Period Weeks     PT LONG TERM GOAL #5   Title Pt will improved gait velocity to at least 2.62 ft/sec for improved gait efficiency and safety.   Time 8   Period Weeks   Status New               Plan - 09/22/16 0959    Clinical Impression Statement Plan for today was to work on car transfers and stairs; however, at beginning of session, pt c/o visual spinning/blurriness of items in direct field of vision (therapist's face, card during visual assessment); attempted to assess vision, vestibular type complaints (as pt did not have BPPV upon assessment last PT visit).  Pt demonstrates slowed eye movements with smooth pursuits, with saccades noted at end range.  (Pt does not c/o spinning sensation with these movements).  Will attempt to incorporate head turns and visual targets into balance and gait activities.   PT Frequency 2x / week   PT Duration 8 weeks   PT Treatment/Interventions ADLs/Self Care Home Management;Functional mobility training;Gait training;Stair training;Therapeutic activities;Therapeutic exercise;Balance training;Neuromuscular re-education;Patient/family education   PT Next Visit Plan Work on intensity of movement in [place], [place]! Moves  (incorporating head turns and visual targets), balance activities; address car transfers and stairs with carrying items.   Consulted and Agree with Plan of Care Patient      Patient will benefit from skilled therapeutic intervention in order to improve the following deficits and impairments:  Abnormal gait, Decreased balance, Decreased mobility, Difficulty walking, Decreased strength, Postural dysfunction  Visit Diagnosis: Other abnormalities of gait and mobility  Abnormal posture  Unsteadiness on feet     Problem List Patient Active Problem List   Diagnosis Date Noted  . Cerumen impaction 09/13/2016  . Chronic venous insufficiency 09/13/2016  . Fatigue 03/15/2016  . Parkinsonian features 09/15/2015  . ED (erectile dysfunction) 09/15/2015  . MDD (major depressive disorder), recurrent episode, severe ([place]) 02/10/2014  . Nonspecific abnormal electrocardiogram (ECG) (EKG) 02/07/2014  . Non-compliant behavior 02/03/2014  . Morton's neuroma of left foot 08/07/2013  . 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 Hyperglycemia 01/26/2012  . Hypogonadism male 01/24/2012  . Syncope and collapse 01/20/2012  .  OSA (obstructive sleep apnea) 01/17/2011  . CHEST TIGHTNESS 02/18/2008    MARRIOTT,AMY W. 09/22/2016, 10:04 AM Frazier Butt., PT  [facility] [address] [place], [place], [address] Phone: [phone]   Fax:  [phone]  Name: [name] [name] MRN: [id number] Date of Birth: [birth year]

## 2016-09-22 NOTE — Therapy (Signed)
[facility] [address] [place], [place], [address] Phone: [phone]   Fax:  [phone]  Occupational Therapy Treatment  Patient Details  Name: [name] [name] MRN: [id number] Date of Birth: 04/22/1951 Referring Provider: Dr. Carles Collet  Encounter Date: 09/22/2016      OT End of Session - 09/22/16 0934    Visit Number 7   Number of Visits 13   Date for OT Re-Evaluation 10/18/15   Authorization Type BCBS   Authorization Time Period 75 visit limit combined need to check to see how many visits used   Authorization - Visit Number 7   Authorization - Number of Visits 11   OT Start Time 0933   OT Stop Time 1015   OT Time Calculation (min) 42 min   Activity Tolerance Patient tolerated treatment well   Behavior During Therapy [facility] for tasks assessed/performed      Past Medical History:  Diagnosis Date  .  OSA (obstructive sleep apnea) 01/17/2011   npsg 2012:  AHI 67/hr. Auto titration 2012:  Optimal pressure 12cm.   . APPENDECTOMY, HX OF 02/18/2008   Qualifier: Diagnosis of  By: [place], [place]    . Cardiac murmur    as a child  . Depression   . Headache(784.0)   . HEADACHES, HX OF 02/18/2008   Qualifier: Diagnosis of  By: Danny Lawless CMA, [place]    . Parkinson disease ([place]) 11/2014  . Streptococcal meningitis    as an infant    Past Surgical History:  Procedure Laterality Date  . APPENDECTOMY  1967  . NASAL SINUS SURGERY     x 4 as a child  . VASECTOMY      There were no vitals filed for this visit.      Subjective Assessment - 09/22/16 0933    Subjective  "I'm doing ok"   Patient Stated Goals improved balance and ease with ADLs   Currently in Pain? No/denies         Neuro re-ed:  PWR! Moves (basic 4) in supine x 10-20 each with min cues For incr movement amplitude, breathing, performance, and sequence.   Began reviewing coordination HEP (previously issued from last bout of therapy, pt had  handout in his notebook):  Flipping cards, dealing cards with thumb, stacking coins, manipulating coins in hand to place in coin bank.  Pt performed with each hand with min cueing for incr movement amplitude.  Will plan to review remaining ball activities next session.                        OT Education - 09/22/16 1138    Education Details Supine PWR! Moves (basic 4)--pt has handout; Recommended pt doing at least 2 PWR! positions per day   Person(s) Educated Patient   Methods Explanation;Demonstration;Verbal cues;Handout   Comprehension Verbalized understanding;Returned demonstration;Verbal cues required  min cueing          OT Short Term Goals - 09/19/16 1033      OT SHORT TERM GOAL #1   Title (P)  Pt will be independent with PD specific  HEP    Time (P)  3   Period (P)  Weeks   Status (P)  On-going     OT SHORT TERM GOAL #2   Title (P)  Pt will demonstrate improved fine motor coordination as evidenced by decreasing LUE 9 hole peg test to 40 secs or less.   Time (P)  3  Period (P)  Weeks   Status (P)  Achieved  31.25 secs     OT SHORT TERM GOAL #3   Title (P)  Pt will verbalize understanding of cognitive compensation strategies/ways to promote cognitive skills prn.   Time (P)  3   Period (P)  Weeks   Status (P)  Achieved     OT SHORT TERM GOAL #4   Title (P)  ----------------------------------------------------------------------------------------------------------------------------------------------------           OT Long Term Goals - 08/10/16 1056      OT LONG TERM GOAL #1   Title Pt will verbalize understanding of adapted strategies for ADLs/IADLs prn    Time 6   Period Weeks   Status New     OT LONG TERM GOAL #2   Title Pt will demonstrate improved functional standing balance as evidenced by increasing LUE standing functional reach to 10 inches or greater   Baseline RUE 10.5 in, LUE 8 in   Time 6   Period Weeks   Status New     OT  LONG TERM GOAL #3   Title Pt will demonstrate ability to write a paragraph with 100% legibility and no significant decrease in letter size.   Baseline Pt reports letter size decreases when he writes a paragraph   Time 6   Period Weeks   Status New     OT LONG TERM GOAL #4   Title Pt will verbalize understanding of ways to prevent future PD-related complications and verbalize understanding of community resources.   Time 6   Period Weeks   Status New               Plan - 09/22/16 0935    Clinical Impression Statement Pt is progressing towards goals.  Pt performs exercises well after initial cueing.   Rehab Potential Good   OT Frequency 2x / week   OT Duration 6 weeks   OT Treatment/Interventions Self-care/ADL training;Therapeutic exercise;Cognitive remediation/compensation;Visual/perceptual remediation/compensation;Neuromuscular education;Parrafin;Moist Heat;Fluidtherapy;Energy conservation;Therapeutic exercises;Patient/family education;Balance training;Therapeutic activities;Passive range of motion;Manual Therapy;DME and/or AE instruction;Ultrasound;Cryotherapy   Plan review ball activities from [place] issued PWR! modified quadraped, standing   Consulted and Agree with Plan of Care Patient      Patient will benefit from skilled therapeutic intervention in order to improve the following deficits and impairments:  Decreased cognition, Impaired flexibility, Decreased mobility, Decreased coordination, Decreased endurance, Decreased range of motion, Decreased strength, Impaired UE functional use, Impaired tone, Impaired perceived functional ability, Decreased safety awareness, Difficulty walking, Decreased balance  Visit Diagnosis: Other symptoms and signs involving the nervous system  Other symptoms and signs involving the musculoskeletal system  Other lack of coordination  Other abnormalities of gait and mobility  Abnormal posture  Unsteadiness on  feet    Problem List Patient Active Problem List   Diagnosis Date Noted  . Cerumen impaction 09/13/2016  . Chronic venous insufficiency 09/13/2016  . Fatigue 03/15/2016  . Parkinsonian features 09/15/2015  . ED (erectile dysfunction) 09/15/2015  . MDD (major depressive disorder), recurrent episode, severe ([place]) 02/10/2014  . Nonspecific abnormal electrocardiogram (ECG) (EKG) 02/07/2014  . Non-compliant behavior 02/03/2014  . Morton's neuroma of left foot 08/07/2013  . 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 Hyperglycemia 01/26/2012  . Hypogonadism male 01/24/2012  . Syncope and collapse 01/20/2012  .  OSA (obstructive sleep apnea) 01/17/2011  . CHEST TIGHTNESS 02/18/2008    [name] [name] 09/22/2016, 11:43 AM  [facility]  [facility] [address] [place], [place], [address] Phone: [phone]   Fax:  [phone]  Name: [name] [name] MRN: [id number] Date of Birth: September 09, 1951   Vianne Bulls, OTR/L [facility] [address] [place], [place]  [address] [phone] phone [phone] 09/22/16 11:52 AM

## 2016-09-22 NOTE — Therapy (Signed)
[facility] [address] [place], [place], [address] Phone: [phone]   Fax:  [phone]  Speech Language Pathology Treatment  Patient Details  Name: [name] [name] MRN: [id number] Date of Birth: [birth year] Referring Provider: Alonza Bogus, D.O.  Encounter Date: 09/22/2016      End of Session - 09/22/16 1201    Visit Number 7   Number of Visits 17   Date for SLP Re-Evaluation 10/28/16   Authorization - Visit Number 6   Authorization - Number of Visits 10   SLP Start Time 1101   SLP Stop Time  [id number]   SLP Time Calculation (min) 47 min      Past Medical History:  Diagnosis Date  .  OSA (obstructive sleep apnea) 01/17/2011   npsg 2012:  AHI 67/hr. Auto titration 2012:  Optimal pressure 12cm.   . APPENDECTOMY, HX OF 02/18/2008   Qualifier: Diagnosis of  By: [place], [place]    . Cardiac murmur    as a child  . Depression   . Headache(784.0)   . HEADACHES, HX OF 02/18/2008   Qualifier: Diagnosis of  By: Danny Lawless CMA, [place]    . Parkinson disease ([place]) 11/2014  . Streptococcal meningitis    as an infant    Past Surgical History:  Procedure Laterality Date  . APPENDECTOMY  1967  . NASAL SINUS SURGERY     x 4 as a child  . VASECTOMY      There were no vitals filed for this visit.      Subjective Assessment - 09/22/16 1113    Subjective "What was that word you told me for my high low?"               ADULT SLP TREATMENT - 09/22/16 1114      General Information   Behavior/Cognition Alert;Cooperative;Pleasant mood     Treatment Provided   Treatment provided Cognitive-Linquistic     Cognitive-Linquistic Treatment   Treatment focused on Cognition;Dysarthria   Skilled Treatment Recalibrated volume with loud /a/ average of 93dB and supervision cues. Pitch glides with min A.  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     Assessment / Recommendations / Plan   Plan Continue with current plan of care     Progression Toward Goals   Progression toward goals Progressing toward goals            SLP Short Term Goals - 09/22/16 1201      SLP SHORT TERM GOAL #1   Title Pt will average loud /a/ of 88dB over 3 sessions   Baseline 09/16/16;   Time 1   Period Weeks   Status On-going     SLP SHORT TERM GOAL #2   Title Pt will average 70dB during structured speech tasks and simple conversation with rare min A over 3 sessions   Time 1   Period Weeks   Status On-going     SLP SHORT TERM GOAL #3   Title pt will demo labial HEP with rare min A over three sessions   Time 1   Period Weeks   Status On-going     SLP SHORT TERM GOAL #4   Title pt will demo compensations for anomia in 5 minutes simple conversation   Time 1   Period Weeks   Status On-going  SLP Long Term Goals - 09/22/16 1201      SLP LONG TERM GOAL #1   Title pt to maintain average 70dB in 15 minutes mod complex conversation outside [place] room over two sessions   Time 5   Period Weeks   Status On-going     SLP LONG TERM GOAL #2   Title pt will demo aphasia compenastions functionally in 8 minutes mod complex conversation over two sessions   Time 5   Period Weeks   Status On-going     SLP LONG TERM GOAL #3   Title pt will demo appropriate breath support for 8 minutes mod complex conversation    Time 5   Period Weeks   Status On-going          Plan - 09/22/16 1149    Clinical Impression Statement Pt with adequate volume over 18 minute conversation with supervision cues. Inflection/expression used when appropriate with rare min A.  Pt was speaking on familiar topic. Pt educated that cognitive load will affect volume. Continue skilled ST to maximize carryover of intellgiblity and cognition/dysnomia.   Speech Therapy Frequency 2x / week   Treatment/Interventions  Language facilitation;Internal/external aids;Compensatory techniques;SLP instruction and feedback;Multimodal communcation approach;Cognitive reorganization;Functional tasks;Cueing hierarchy;Patient/family education   Potential to Achieve Goals Good   Consulted and Agree with Plan of Care Patient      Patient will benefit from skilled therapeutic intervention in order to improve the following deficits and impairments:   Dysarthria and anarthria    Problem List Patient Active Problem List   Diagnosis Date Noted  . Cerumen impaction 09/13/2016  . Chronic venous insufficiency 09/13/2016  . Fatigue 03/15/2016  . Parkinsonian features 09/15/2015  . ED (erectile dysfunction) 09/15/2015  . MDD (major depressive disorder), recurrent episode, severe ([place]) 02/10/2014  . Nonspecific abnormal electrocardiogram (ECG) (EKG) 02/07/2014  . Non-compliant behavior 02/03/2014  . Morton's neuroma of left foot 08/07/2013  . 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 Hyperglycemia 01/26/2012  . Hypogonadism male 01/24/2012  . Syncope and collapse 01/20/2012  .  OSA (obstructive sleep apnea) 01/17/2011  . CHEST TIGHTNESS 02/18/2008    Lovvorn, Annye Rusk  MS, CCC-SLP 09/22/2016, 12:02 PM  [facility] [address] [place], [place], [address] Phone: [phone]   Fax:  [phone]   Name: [name] [name] MRN: [id number] Date of Birth: 23-Nov-1950

## 2016-09-22 NOTE — Patient Instructions (Signed)
  Knoll - for high low pitch glides  Use big breath to power your voice and achieve volume  If you are asked to repeat yourself - take a big breath and think shout!  Practice reading out loud with loud voice and exaggerated expressions after loud "AH!'s"  Continue facial expressions practice and practice intonation

## 2016-09-28 ENCOUNTER — Telehealth: Payer: Self-pay | Admitting: Neurology

## 2016-09-28 ENCOUNTER — Ambulatory Visit: Payer: Federal, State, Local not specified - PPO | Admitting: Physical Therapy

## 2016-09-28 ENCOUNTER — Ambulatory Visit: Payer: Federal, State, Local not specified - PPO | Admitting: Speech Pathology

## 2016-09-28 ENCOUNTER — Ambulatory Visit: Payer: Federal, State, Local not specified - PPO | Admitting: Occupational Therapy

## 2016-09-28 ENCOUNTER — Encounter: Payer: Self-pay | Admitting: Physical Therapy

## 2016-09-28 VITALS — BP 121/72

## 2016-09-28 DIAGNOSIS — R293 Abnormal posture: Secondary | ICD-10-CM

## 2016-09-28 DIAGNOSIS — R2689 Other abnormalities of gait and mobility: Secondary | ICD-10-CM

## 2016-09-28 DIAGNOSIS — R2681 Unsteadiness on feet: Secondary | ICD-10-CM

## 2016-09-28 DIAGNOSIS — R29898 Other symptoms and signs involving the musculoskeletal system: Secondary | ICD-10-CM

## 2016-09-28 DIAGNOSIS — R29818 Other symptoms and signs involving the nervous system: Secondary | ICD-10-CM

## 2016-09-28 DIAGNOSIS — R471 Dysarthria and anarthria: Secondary | ICD-10-CM

## 2016-09-28 DIAGNOSIS — R278 Other lack of coordination: Secondary | ICD-10-CM

## 2016-09-28 DIAGNOSIS — R4701 Aphasia: Secondary | ICD-10-CM

## 2016-09-28 NOTE — Therapy (Signed)
[facility] [address] [place], [place], [address] Phone: [phone]   Fax:  [phone]  Physical Therapy Treatment  Patient Details  Name: [name] [name] MRN: [id number] Date of Birth: 12/06/1950 Referring Provider: Alonza Bogus , DO  Encounter Date: 09/28/2016      PT End of Session - 09/28/16 1142    Visit Number 9   Number of Visits 17   Date for PT Re-Evaluation 10/09/16   Authorization Type [employer]-75 visit limit (14 visits already used from PT ending 02/2016)-GCODE   PT Start Time 1022   PT Stop Time 1100   PT Time Calculation (min) 38 min   Behavior During Therapy [facility] for tasks assessed/performed      Past Medical History:  Diagnosis Date  .  OSA (obstructive sleep apnea) 01/17/2011   npsg 2012:  AHI 67/hr. Auto titration 2012:  Optimal pressure 12cm.   . APPENDECTOMY, HX OF 02/18/2008   Qualifier: Diagnosis of  By: [place], [place]    . Cardiac murmur    as a child  . Depression   . Headache(784.0)   . HEADACHES, HX OF 02/18/2008   Qualifier: Diagnosis of  By: Danny Lawless CMA, [place]    . Parkinson disease ([place]) 11/2014  . Streptococcal meningitis    as an infant    Past Surgical History:  Procedure Laterality Date  . APPENDECTOMY  1967  . NASAL SINUS SURGERY     x 4 as a child  . VASECTOMY      Vitals:   09/28/16 1026  BP: 121/72        Subjective Assessment - 09/28/16 1024    Subjective " I feel queesy, like I feel dizzy.  Normally when I feel like this I take a nap and I feel better." Pt feels like car transfers have gotten better over all.   Currently in Pain? No/denies                         [facility] Adult PT Treatment/Exercise - 09/28/16 0001      Transfers   Transfer Cueing Worked on simulated car transfers using transition sitting PWR! moves.pt demonstrated slow movements; cues for increased forward weight shift for sit to stands required UE support..   Multiple reps to practise.             Balance Exercises - 09/28/16 1139      Balance Exercises: Standing   Step Over Hurdles / Cones sidestepping (high march) over step ladder progressing with visual scanning then progressing ambulating forward with pause at each step to work on balance in stagger stance progressing with visual tracking task,   Multiple reps with each.                                     PT Short Term Goals - 09/15/16 0929      PT SHORT TERM GOAL #1   Title Pt will be independent with HEP for balance, functional strengthening and gait.  TARGET 09/08/16   Baseline Pt performs consistenly bike at home and goes to gym but has not followed through with HEP from rehab, 09/15/16.   Time 4   Period Weeks   Status Partially Met     PT SHORT TERM GOAL #2   Title Pt will improve TUG score to less than or equal  to 13.5 seconds for decreased fall risk.   Baseline TUG 14.5, 09/15/16.   Time 4   Period Weeks   Status Not Met     PT SHORT TERM GOAL #3   Title Pt will improve single limb stance to at least 3 seconds on LLE for improved stair negotiation and obstacle negotiation.   Baseline < 3 seconds on LLE, 09/15/16.   Time 4   Period Weeks   Status Not Met     PT SHORT TERM GOAL #4   Title Pt will negotiate 4 steps,carrying objects resembling boxes or laundry that he would carry at home, step to pattern, with supervision.   Baseline MEt, 09/15/16.   Time 4   Period Weeks   Status Achieved           PT Long Term Goals - 08/10/16 1244      PT LONG TERM GOAL #1   Title Pt will verbalize understanding of fall prevention/tips to reduce freezing with gait.  TARGET 10/09/16   Time 8   Period Weeks   Status New     PT LONG TERM GOAL #2   Title Pt will improve TUG cognitive to less than or equal to 15 seconds for decreased fall risk.   Time 8   Period Weeks   Status New     PT LONG TERM GOAL #3   Title Pt will improve MiniBESTest to at least 24/28 for  decreased fall risk.   Time 8   Period Weeks   Status New     PT LONG TERM GOAL #4   Title Pt will negotiate at least 4 steps carrying objects simulating household items, step to pattern, modified independently no LOB.   Time 8   Period Weeks     PT LONG TERM GOAL #5   Title Pt will improved gait velocity to at least 2.62 ft/sec for improved gait efficiency and safety.   Time 8   Period Weeks   Status New               Plan - 09/28/16 1143    Clinical Impression Statement Pt reported some initial dizziness at the beginning of the session but reported no dizziness with mobility and visual scanning and tracking task. Pt demonstrates continued difficulty with standing balance with narrow BOS and visual scanning.   PT Frequency 2x / week   PT Duration 8 weeks   PT Treatment/Interventions ADLs/Self Care Home Management;Functional mobility training;Gait training;Stair training;Therapeutic activities;Therapeutic exercise;Balance training;Neuromuscular re-education;Patient/family education   PT Next Visit Plan G-code;Work on intensity of movement in [place], [place]! Moves (incorporating head turns and visual targets), balance activities; address car transfers and stairs with carrying items.   Consulted and Agree with Plan of Care Patient      Patient will benefit from skilled therapeutic intervention in order to improve the following deficits and impairments:  Abnormal gait, Decreased balance, Decreased mobility, Difficulty walking, Decreased strength, Postural dysfunction  Visit Diagnosis: Other abnormalities of gait and mobility  Abnormal posture  Other symptoms and signs involving the musculoskeletal system  Unsteadiness on feet     Problem List Patient Active Problem List   Diagnosis Date Noted  . Cerumen impaction 09/13/2016  . Chronic venous insufficiency 09/13/2016  . Fatigue 03/15/2016  . Parkinsonian features 09/15/2015  . ED (erectile dysfunction)  09/15/2015  . MDD (major depressive disorder), recurrent episode, severe ([place]) 02/10/2014  . Nonspecific abnormal electrocardiogram (ECG) (EKG) 02/07/2014  . Non-compliant behavior 02/03/2014  .  Morton's neuroma of left foot 08/07/2013  . 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 Hyperglycemia 01/26/2012  . Hypogonadism male 01/24/2012  . Syncope and collapse 01/20/2012  .  OSA (obstructive sleep apnea) 01/17/2011  . CHEST TIGHTNESS 02/18/2008    Bjorn Loser, PTA  09/28/16, 11:48 AM [facility] [address] [place], [place], [address] Phone: [phone]   Fax:  [phone]  Name: [name] [name] MRN: [id number] Date of Birth: 12-Sep-1951

## 2016-09-28 NOTE — Therapy (Signed)
[facility] [address] [place], [place], [address] Phone: [phone]   Fax:  [phone]  Occupational Therapy Treatment  Patient Details  Name: [name] [name] MRN: [id number] Date of Birth: 10-12-1950 Referring Provider: Dr. Carles Collet  Encounter Date: 09/28/2016      OT End of Session - 09/28/16 1101    Visit Number 8   Number of Visits 13   Date for OT Re-Evaluation 10/18/15   Authorization Type BCBS   Authorization Time Period 75 visit limit combined need to check to see how many visits used   Authorization - Visit Number 8   Authorization - Number of Visits 11   OT Start Time 1103   OT Stop Time 1145   OT Time Calculation (min) 42 min   Activity Tolerance Patient tolerated treatment well   Behavior During Therapy [facility] for tasks assessed/performed      Past Medical History:  Diagnosis Date  .  OSA (obstructive sleep apnea) 01/17/2011   npsg 2012:  AHI 67/hr. Auto titration 2012:  Optimal pressure 12cm.   . APPENDECTOMY, HX OF 02/18/2008   Qualifier: Diagnosis of  By: [place], [place]    . Cardiac murmur    as a child  . Depression   . Headache(784.0)   . HEADACHES, HX OF 02/18/2008   Qualifier: Diagnosis of  By: Danny Lawless CMA, [place]    . Parkinson disease ([place]) 11/2014  . Streptococcal meningitis    as an infant    Past Surgical History:  Procedure Laterality Date  . APPENDECTOMY  1967  . NASAL SINUS SURGERY     x 4 as a child  . VASECTOMY      There were no vitals filed for this visit.      Subjective Assessment - 09/28/16 1105    Subjective  Pt reports that he didn't do his exercises over holidays   Limitations see Epic   Patient Stated Goals improved balance and ease with ADLs   Currently in Pain? No/denies       Reviewed ball activities from previous coordination HEP (rotating in fingertips with each hand/each direction, tossing in same hand, tossing between hands, rotating 2 small  balls in each hand/each direction with max difficulty 1 direction).     Practiced writing with min cueing initially to fill up lines.  Pt with good legibility, but initial decr in size prior to cueing.  After cueing, pt maintained size with no decr.  Arm bike x6 min level 1 for reciprocal movement/conditioning (forward/backwards) with cueing to maintain >40rpms.  Pt able to maintain in forward direction but difficulty in backwards direction.                        OT Education - 09/28/16 1131    Education Details Upcoming Events/Community Resources   Person(s) Educated Patient   Methods Explanation;Handout   Comprehension Verbalized understanding          OT Short Term Goals - 09/28/16 1115      OT SHORT TERM GOAL #1   Title Pt will be independent with PD specific  HEP    Time 3   Period Weeks   Status Achieved  09/28/16 met in clinic, but not performing consistently at home     OT [place] #2   Title Pt will demonstrate improved fine motor coordination as evidenced by decreasing LUE 9 hole peg test to 40 secs or less.  Time 3   Period Weeks   Status Achieved  31.25 secs     OT SHORT TERM GOAL #3   Title Pt will verbalize understanding of cognitive compensation strategies/ways to promote cognitive skills prn.   Time 3   Period Weeks   Status Achieved     OT SHORT TERM GOAL #4   Title ----------------------------------------------------------------------------------------------------------------------------------------------------           OT Long Term Goals - 09/28/16 1128      OT LONG TERM GOAL #1   Title Pt will verbalize understanding of adapted strategies for ADLs/IADLs prn    Time 6   Period Weeks   Status New     OT LONG TERM GOAL #2   Title Pt will demonstrate improved functional standing balance as evidenced by increasing LUE standing functional reach to 10 inches or greater   Baseline RUE 10.5 in, LUE 8 in   Time 6    Period Weeks   Status New     OT LONG TERM GOAL #3   Title Pt will demonstrate ability to write a paragraph with 100% legibility and no significant decrease in letter size.   Baseline Pt reports letter size decreases when he writes a paragraph   Time 6   Period Weeks   Status New     OT LONG TERM GOAL #4   Title Pt will verbalize understanding of ways to prevent future PD-related complications and verbalize understanding of community resources.   Time 6   Period Weeks   Status Achieved  09/28/16 (community events, POP, exercise classes, PD organization resources)               Plan - 09/28/16 1116    Clinical Impression Statement Pt progressing towards goals.  Pt continues to need reinforcement for carryover of HEP and large amplitude movement strategies.   Rehab Potential Good   OT Frequency 2x / week   OT Duration 6 weeks   OT Treatment/Interventions Self-care/ADL training;Therapeutic exercise;Cognitive remediation/compensation;Visual/perceptual remediation/compensation;Neuromuscular education;Parrafin;Moist Heat;Fluidtherapy;Energy conservation;Therapeutic exercises;Patient/family education;Balance training;Therapeutic activities;Passive range of motion;Manual Therapy;DME and/or AE instruction;Ultrasound;Cryotherapy   OT Home Exercise Plan issued PWR! modified quadraped, standing   Consulted and Agree with Plan of Care Patient      Patient will benefit from skilled therapeutic intervention in order to improve the following deficits and impairments:  Decreased cognition, Impaired flexibility, Decreased mobility, Decreased coordination, Decreased endurance, Decreased range of motion, Decreased strength, Impaired UE functional use, Impaired tone, Impaired perceived functional ability, Decreased safety awareness, Difficulty walking, Decreased balance  Visit Diagnosis: Other symptoms and signs involving the nervous system  Other symptoms and signs involving the musculoskeletal  system  Other lack of coordination  Other abnormalities of gait and mobility  Abnormal posture    Problem List Patient Active Problem List   Diagnosis Date Noted  . Cerumen impaction 09/13/2016  . Chronic venous insufficiency 09/13/2016  . Fatigue 03/15/2016  . Parkinsonian features 09/15/2015  . ED (erectile dysfunction) 09/15/2015  . MDD (major depressive disorder), recurrent episode, severe ([place]) 02/10/2014  . Nonspecific abnormal electrocardiogram (ECG) (EKG) 02/07/2014  . Non-compliant behavior 02/03/2014  . Morton's neuroma of left foot 08/07/2013  . 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 Hyperglycemia 01/26/2012  . Hypogonadism male 01/24/2012  . Syncope and collapse 01/20/2012  .  OSA (obstructive sleep apnea) 01/17/2011  . CHEST TIGHTNESS 02/18/2008    [facility] 09/28/2016, 11:34 AM   [facility] [place]  [place], [place], [address] Phone: [phone]   Fax:  [phone]  Name: [name] [name] MRN: [id number] Date of Birth: Feb 23, 1951   Vianne Bulls, OTR/L [facility] [address] [place], [place]  [address] [phone] phone [phone] 09/28/16 11:34 AM

## 2016-09-28 NOTE — Patient Instructions (Signed)
(  Exercise) Monday Tuesday Wednesday Thursday Friday Saturday Sunday   PWR! Supine  (lying on back)           PWR! Prone (lying on stomach)           PWR! All fours (hands/knees)           PWR! standing           PWR! sitting           PWR! hands           Coordination HEP           Bike

## 2016-09-28 NOTE — Telephone Encounter (Signed)
Patient wife Mrs Reininger called and wants to talk about her husband rehab. They have changed the way they are going to to do the copay and it may be a hardship on them. Patient is thinking about canceling the ones in Jan. Also his rehab nurse [address] said he was doing fine please call [phone]

## 2016-09-28 NOTE — Therapy (Signed)
[facility] [address] [place], [place], [address] Phone: [phone]   Fax:  [phone]  Speech Language Pathology Treatment  Patient Details  Name: [name] [name] MRN: [id number] Date of Birth: 1951/04/25 Referring Provider: Alonza Bogus, D.O.  Encounter Date: 09/28/2016      End of Session - 09/28/16 1020    Visit Number 8   Number of Visits 17   Date for SLP Re-Evaluation 10/28/16   Authorization Type 75 total visits - pt had 42 visits+3 evals from [facility] 2017   Authorization - Visit Number 7   Authorization - Number of Visits 10   SLP Start Time 0930   SLP Stop Time  1020   SLP Time Calculation (min) 50 min   Activity Tolerance Patient tolerated treatment well      Past Medical History:  Diagnosis Date  .  OSA (obstructive sleep apnea) 01/17/2011   npsg 2012:  AHI 67/hr. Auto titration 2012:  Optimal pressure 12cm.   . APPENDECTOMY, HX OF 02/18/2008   Qualifier: Diagnosis of  By: [place], [place]    . Cardiac murmur    as a child  . Depression   . Headache(784.0)   . HEADACHES, HX OF 02/18/2008   Qualifier: Diagnosis of  By: Danny Lawless CMA, [place]    . Parkinson disease ([place]) 11/2014  . Streptococcal meningitis    as an infant    Past Surgical History:  Procedure Laterality Date  . APPENDECTOMY  1967  . NASAL SINUS SURGERY     x 4 as a child  . VASECTOMY      There were no vitals filed for this visit.      Subjective Assessment - 09/28/16 0935    Subjective "I guess I'm ok"   Currently in Pain? No/denies               ADULT SLP TREATMENT - 09/28/16 0937      General Information   Behavior/Cognition Alert;Cooperative;Pleasant mood     Treatment Provided   Treatment provided Cognitive-Linquistic     Cognitive-Linquistic Treatment   Treatment focused on Cognition;Dysarthria   Skilled Treatment Loud /a/ to recalibrate volume average of 92dB - pitch range exercises for  prosody with usual min to mod A. Pt performed HEP for labial strength with occasional min A. Mildly complex conversation average 70dB with rare min A.      Assessment / Recommendations / Plan   Plan Continue with current plan of care     Progression Toward Goals   Progression toward goals Progressing toward goals            SLP Short Term Goals - 09/28/16 1018      SLP SHORT TERM GOAL #1   Title Pt will average loud /a/ of 88dB over 3 sessions   Baseline 09/16/16; 09/27/16, 09/28/16   Time 1   Period Weeks   Status Achieved     SLP SHORT TERM GOAL #2   Title Pt will average 70dB during structured speech tasks and simple conversation with rare min A over 3 sessions   Time 1   Period Weeks   Status Achieved     SLP SHORT TERM GOAL #3   Title pt will demo labial HEP with rare min A over three sessions   Time 1   Period Weeks   Status On-going     SLP SHORT TERM GOAL #4   Title pt will demo compensations for anomia  in 5 minutes simple conversation   Time 1   Period Weeks   Status On-going          SLP Long Term Goals - 09/28/16 1019      SLP LONG TERM GOAL #1   Title pt to maintain average 70dB in 15 minutes mod complex conversation outside Parachute room over two sessions   Time 4   Period Weeks   Status On-going     SLP LONG TERM GOAL #2   Title pt will demo aphasia compenastions functionally in 8 minutes mod complex conversation over two sessions   Time 4   Period Weeks   Status On-going     SLP LONG TERM GOAL #3   Title pt will demo appropriate breath support for 8 minutes mod complex conversation    Time 4   Period Weeks   Status On-going          Plan - 09/28/16 1015    Clinical Impression Statement Pt performed HEP for labial strength and pitch range with min A - Mildly comlex conversation average 70dB over 8 minutes with min A. Continue skilled ST to maximize intellgibility and verbal expression.    Speech Therapy Frequency 2x / week    Treatment/Interventions Language facilitation;Internal/external aids;Compensatory techniques;SLP instruction and feedback;Multimodal communcation approach;Cognitive reorganization;Functional tasks;Cueing hierarchy;Patient/family education   Potential to Achieve Goals Good   Consulted and Agree with Plan of Care Patient      Patient will benefit from skilled therapeutic intervention in order to improve the following deficits and impairments:   Dysarthria and anarthria  Aphasia    Problem List Patient Active Problem List   Diagnosis Date Noted  . Cerumen impaction 09/13/2016  . Chronic venous insufficiency 09/13/2016  . Fatigue 03/15/2016  . Parkinsonian features 09/15/2015  . ED (erectile dysfunction) 09/15/2015  . MDD (major depressive disorder), recurrent episode, severe ([place]) 02/10/2014  . Nonspecific abnormal electrocardiogram (ECG) (EKG) 02/07/2014  . Non-compliant behavior 02/03/2014  . Morton's neuroma of left foot 08/07/2013  . 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 Hyperglycemia 01/26/2012  . Hypogonadism male 01/24/2012  . Syncope and collapse 01/20/2012  .  OSA (obstructive sleep apnea) 01/17/2011  . CHEST TIGHTNESS 02/18/2008    [name] [name], [name] Rusk MS, CCC-SLP 09/28/2016, 10:20 AM  [facility] [address] [place], [place], [address] Phone: [phone]   Fax:  [phone]   Name: [name] [name] MRN: [id number] Date of Birth: 1950/11/28

## 2016-09-28 NOTE — Telephone Encounter (Signed)
Spoke with patient and he wanted to know if it would be okay if he stopped PT to save his visits for later in the year since he is doing so well. He has been in therapy for about 4 weeks.  I advised that the physical therapists were the experts on how long they think you need to be in therapy and Dr. Carles Collet usually defers to them on that kind of decision.

## 2016-09-28 NOTE — Patient Instructions (Signed)
  Put tongue depressor in the right side of your lips, gently pull, keeping the tongue depressor in your lips  Switch to the left side and do the same  Do 10x each side twice a day - after your AH!'s  Continue High Low's 5x twice a day Laroy Apple)

## 2016-09-29 ENCOUNTER — Ambulatory Visit: Payer: Federal, State, Local not specified - PPO | Admitting: Occupational Therapy

## 2016-09-29 ENCOUNTER — Ambulatory Visit: Payer: Federal, State, Local not specified - PPO | Admitting: Physical Therapy

## 2016-09-29 ENCOUNTER — Ambulatory Visit: Payer: Federal, State, Local not specified - PPO

## 2016-09-29 DIAGNOSIS — R2689 Other abnormalities of gait and mobility: Secondary | ICD-10-CM

## 2016-09-29 DIAGNOSIS — R29898 Other symptoms and signs involving the musculoskeletal system: Secondary | ICD-10-CM

## 2016-09-29 DIAGNOSIS — R278 Other lack of coordination: Secondary | ICD-10-CM

## 2016-09-29 DIAGNOSIS — R4701 Aphasia: Secondary | ICD-10-CM

## 2016-09-29 DIAGNOSIS — R293 Abnormal posture: Secondary | ICD-10-CM

## 2016-09-29 DIAGNOSIS — R471 Dysarthria and anarthria: Secondary | ICD-10-CM

## 2016-09-29 DIAGNOSIS — R2681 Unsteadiness on feet: Secondary | ICD-10-CM | POA: Diagnosis not present

## 2016-09-29 DIAGNOSIS — R29818 Other symptoms and signs involving the nervous system: Secondary | ICD-10-CM

## 2016-09-29 NOTE — Therapy (Signed)
[facility] [address] [place], [place], [address] Phone: [phone]   Fax:  [phone]  Speech Language Pathology Treatment  Patient Details  Name: [name] [name] MRN: [id number] Date of Birth: [birth year] Referring Provider: Alonza Bogus, D.O.  Encounter Date: 09/29/2016      End of Session - 09/29/16 1057    Visit Number 9   Number of Visits 17   Date for SLP Re-Evaluation 10/28/16   Authorization - Visit Number 8   Authorization - Number of Visits 10   SLP Start Time 9485   SLP Stop Time  1100   SLP Time Calculation (min) 42 min      Past Medical History:  Diagnosis Date  .  OSA (obstructive sleep apnea) 01/17/2011   npsg 2012:  AHI 67/hr. Auto titration 2012:  Optimal pressure 12cm.   . APPENDECTOMY, HX OF 02/18/2008   Qualifier: Diagnosis of  By: [place], [place]    . Cardiac murmur    as a child  . Depression   . Headache(784.0)   . HEADACHES, HX OF 02/18/2008   Qualifier: Diagnosis of  By: Danny Lawless CMA, [place]    . Parkinson disease ([place]) 11/2014  . Streptococcal meningitis    as an infant    Past Surgical History:  Procedure Laterality Date  . APPENDECTOMY  1967  . NASAL SINUS SURGERY     x 4 as a child  . VASECTOMY      There were no vitals filed for this visit.      Subjective Assessment - 09/29/16 1025    Subjective "I think I've reached a plateau with this therapy too, so today's my last session." Pt reported he is speaking louder at home "when he's told to".               ADULT SLP TREATMENT - 09/29/16 1031      General Information   Behavior/Cognition Alert;Cooperative;Pleasant mood     Treatment Provided   Treatment provided Cognitive-Linquistic     Cognitive-Linquistic Treatment   Treatment focused on Cognition;Dysarthria   Skilled Treatment Loud /a/ to recalibrate volume average of 91dB - pitch range exercises for prosody with rare min A. Mildly-mod  complex conversation average in low 70's dB with rare min A.      Assessment / Recommendations / Plan   Plan --  d/c today by pt's request     Progression Toward Goals   Progression toward goals --  see goal update - d/c today            SLP Short Term Goals - 09/29/16 1102      SLP SHORT TERM GOAL #1   Title Pt will average loud /a/ of 88dB over 3 sessions   Baseline 09/16/16; 09/27/16, 09/28/16   Time 1   Period Weeks   Status Achieved     SLP SHORT TERM GOAL #2   Title Pt will average 70dB during structured speech tasks and simple conversation with rare min A over 3 sessions   Time 1   Period Weeks   Status Achieved     SLP SHORT TERM GOAL #3   Title pt will demo labial HEP with rare min A over three sessions   Baseline 09-29-16   Status Partially Met     SLP SHORT TERM GOAL #4   Title pt will demo compensations for anomia in 5 minutes simple conversation   Status Achieved  SLP Long Term Goals - 09/29/16 1101      SLP LONG TERM GOAL #1   Title pt to maintain average 70dB in 15 minutes mod complex conversation outside [place] room over two sessions   Baseline 09-29-16   Status Partially Met     SLP LONG TERM GOAL #2   Title pt will demo aphasia compenastions functionally in 8 minutes mod complex conversation over two sessions   Baseline 09-29-16   Status Partially Met     SLP LONG TERM GOAL #3   Title pt will demo appropriate breath support for 8 minutes mod complex conversation    Baseline min-mod complex   Status Partially Met          Plan - 09/29/16 1058    Clinical Impression Statement Pt with conversation (mildly-mod complex) with rare min A for loudness with average in low 70sdB over 18 minutes. Pt performed loud /a/ and hi-lo, lo-hi with rare min A. ST to d/c  at this time due to pt request.    Treatment/Interventions Language facilitation;Internal/external aids;Compensatory techniques;SLP instruction and feedback;Multimodal communcation  approach;Cognitive reorganization;Functional tasks;Cueing hierarchy;Patient/family education   Potential to Achieve Goals Good   Consulted and Agree with Plan of Care Patient      Patient will benefit from skilled therapeutic intervention in order to improve the following deficits and impairments:   Dysarthria and anarthria  Aphasia   SPEECH THERAPY DISCHARGE SUMMARY  Visits from Start of Care: 9  Current functional level related to goals / functional outcomes: See goal update, above.  Pt conveyed to SLP he is louder with speech at home when wife asks him to incr volume. Pt requested d/c today due to d/c in PT.   Remaining deficits: Dysarthria, c/b reduced speech volume when pt not focused on louder speech.   Education / Equipment: Loud "ah" maintenance, need to remain loud throughout the day, external cues for speech loudenss.  Plan: Patient agrees to discharge.  Patient goals were partially met. Patient is being discharged due to the patient's request.  ?????       Problem List Patient Active Problem List   Diagnosis Date Noted  . Cerumen impaction 09/13/2016  . Chronic venous insufficiency 09/13/2016  . Fatigue 03/15/2016  . Parkinsonian features 09/15/2015  . ED (erectile dysfunction) 09/15/2015  . MDD (major depressive disorder), recurrent episode, severe ([place]) 02/10/2014  . Nonspecific abnormal electrocardiogram (ECG) (EKG) 02/07/2014  . Non-compliant behavior 02/03/2014  . Morton's neuroma of left foot 08/07/2013  . 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 Hyperglycemia 01/26/2012  . Hypogonadism male 01/24/2012  . Syncope and collapse 01/20/2012  .  OSA (obstructive sleep apnea) 01/17/2011  . CHEST TIGHTNESS 02/18/2008    Shamon Cothran ,MS, CCC-SLP  09/29/2016, 11:04 AM  [facility] [address] [place], [place], [address] Phone: [phone]   Fax:   [phone]   Name: CHARLTON BOULE MRN: [id number] Date of Birth: [birth year]

## 2016-09-29 NOTE — Patient Instructions (Signed)
It is important to avoid accidents which may result in broken bones.  Here are a few ideas on how to make your home safer so you will be less likely to trip or fall.  1. Use nonskid mats or non slip strips in your shower or tub, on your bathroom floor and around sinks.  If you know that you have spilled water, wipe it up! 2. In the bathroom, it is important to have properly installed grab bars on the walls or on the edge of the tub.  Towel racks are NOT strong enough for you to hold onto or to pull on for support. 3. Stairs and hallways should have enough light.  Add lamps or night lights if you need ore light. 4. It is good to have handrails on both sides of the stairs if possible.  Always fix broken handrails right away. 5. It is important to see the edges of steps.  Paint the edges of outdoor steps white so you can see them better.  Put colored tape on the edge of inside steps. 6. Throw-rugs are dangerous because they can slide.  Removing the rugs is the best idea, but if they must stay, add adhesive carpet tape to prevent slipping. 7. Do not keep things on stairs or in the halls.  Remove small furniture that blocks the halls as it may cause you to trip.  Keep telephone and electrical cords out of the way where you walk. 8. Always were sturdy, rubber-soled shoes for good support.  Never wear just socks, especially on the stairs.  Socks may cause you to slip or fall.  Do not wear full-length housecoats as you can easily trip on the bottom.  9. Place the things you use the most on the shelves that are the easiest to reach.  If you use a stepstool, make sure it is in good condition.  If you feel unsteady, DO NOT climb, ask for help. 10. If a health professional advises you to use a cane or walker, do not be ashamed.  These items can keep you from falling and breaking your bones.    Tips to reduce freezing episodes with standing or walking:  11. Stand tall with your feet wide, so that you can rock  and weight shift through your hips. 12. Don't try to fight the freeze: if you begin taking slower, faster, smaller steps, STOP, get your posture tall, and RESET your posture and balance.  Take a deep breath before taking the BIG step to start again. 13. March in place, with high knee stepping, to get started walking again. 14. Use auditory cues:  Count out loud, think of a familiar tune or song or cadence, use pocket metronome, to use rhythm to get started walking again. 15. Use visual cues:  Use a line to step over, use laser pointer line to step over, (using BIG steps) to start walking again. 16. Use visual targets to keep your posture tall (look ahead and focus on an object or target at eye level). 17. As you approach where your destination with walking, count your steps out loud and/or focus on your target with your eyes until you are fully there. 18. Use appropriate assistive device, as advised by your physical therapist to assist with taking longer, consistent steps.      

## 2016-09-29 NOTE — Therapy (Signed)
[facility] [address] [place], [place], [address] Phone: [phone]   Fax:  [phone]  Occupational Therapy Treatment  Patient Details  Name: NICKSON MIDDLESWORTH MRN: [id number] Date of Birth: Jun 23, 1951 Referring Provider: Dr. Carles Collet  Encounter Date: 09/29/2016      OT End of Session - 09/29/16 0901    Visit Number 9   Number of Visits 13   Date for OT Re-Evaluation 10/18/15   Authorization Type [employer]   Authorization Time Period 75 visit limit combined need to check to see how many visits used   Authorization - Visit Number 9   Authorization - Number of Visits 11   OT Start Time [phone]   OT Stop Time 0930   OT Time Calculation (min) 38 min   Activity Tolerance Patient tolerated treatment well   Behavior During Therapy [facility] for tasks assessed/performed      Past Medical History:  Diagnosis Date  .  OSA (obstructive sleep apnea) 01/17/2011   npsg 2012:  AHI 67/hr. Auto titration 2012:  Optimal pressure 12cm.   . APPENDECTOMY, HX OF 02/18/2008   Qualifier: Diagnosis of  By: [place], [place]    . Cardiac murmur    as a child  . Depression   . Headache(784.0)   . HEADACHES, HX OF 02/18/2008   Qualifier: Diagnosis of  By: Danny Lawless CMA, [place]    . Parkinson disease ([place]) 11/2014  . Streptococcal meningitis    as an infant    Past Surgical History:  Procedure Laterality Date  . APPENDECTOMY  1967  . NASAL SINUS SURGERY     x 4 as a child  . VASECTOMY      There were no vitals filed for this visit.      Subjective Assessment - 09/29/16 0900    Subjective  Pt reports desire to d/c today.  "I just need to do the exercises"   Limitations see Epic   Patient Stated Goals improved balance and ease with ADLs   Currently in Pain? No/denies        Neuro re-ed:  PWR! Moves (basic 4) in supine x 20 each with min cues For incr movement amplitude.   Self Care:    Emphasized importance of performing HEP  regularly and using large amplitude movements for ADLs to prevent future complications.  Reviewed recommendation to participate in PWR! Class.  Recommended pt to perform exercises in morning to improve movement amplitude for the rest of the day.  Helped pt to reorganize his PD education notebook to take out duplicates and organize to put HEP to emphasize in front/together.  Pt verbalized understanding  Assessed remaining goals and discussed progress-see goals section.  Recommended re-evaluation in approx 6 months.  Pt agreed.  Dressing:  Practiced donning/doffing jacket using large amplitude movement strategies after initial instruction.  Pt demo improvement with repetition and use/min cues for large amplitude movements.  Functional mobility:   In standing, functional reaching to grasp/release cylinder objects with use of PWR! Hands/reach with min cueing for large amplitude  Movements and keeping feet apart for improved balance.  Sit>stand with min cues for large amplitude movement technique.  Supine>sitting with min cues for use of large amplitude movement strategy to incr ease and decr stress on back to prevent pain.  Pt verbalized understanding and returned demo with cueing.  OT Short Term Goals - 09/28/16 1115      OT SHORT TERM GOAL #1   Title Pt will be independent with PD specific  HEP    Time 3   Period Weeks   Status Achieved  09/28/16 met in clinic, but not performing consistently at home     OT SHORT TERM GOAL #2   Title Pt will demonstrate improved fine motor coordination as evidenced by decreasing LUE 9 hole peg test to 40 secs or less.   Time 3   Period Weeks   Status Achieved  31.25 secs     OT SHORT TERM GOAL #3   Title Pt will verbalize understanding of cognitive compensation strategies/ways to promote cognitive skills prn.   Time 3   Period Weeks   Status Achieved     OT SHORT TERM GOAL #4   Title  ----------------------------------------------------------------------------------------------------------------------------------------------------           OT Long Term Goals - 09/29/16 0915      OT LONG TERM GOAL #1   Title Pt will verbalize understanding of adapted strategies for ADLs/IADLs prn    Time 6   Period Weeks   Status Achieved     OT LONG TERM GOAL #2   Title Pt will demonstrate improved functional standing balance as evidenced by increasing LUE standing functional reach to 10 inches or greater   Baseline RUE 10.5 in, LUE 8 in   Time 6   Period Weeks   Status Achieved  09/29/16:  10" bilaterally     OT LONG TERM GOAL #3   Title Pt will demonstrate ability to write a paragraph with 100% legibility and no significant decrease in letter size.   Baseline Pt reports letter size decreases when he writes a paragraph   Time 6   Period Weeks   Status Achieved     OT LONG TERM GOAL #4   Title Pt will verbalize understanding of ways to prevent future PD-related complications and verbalize understanding of community resources.   Time 6   Period Weeks   Status Achieved  09/28/16 (community events, POP, exercise classes, PD organization resources)               Plan - 09/29/16 0901    Clinical Impression Statement Pt reports desire to d/c today.  Encouraged pt to be consistent with HEP and participate in PWR! community exercise class for incr carryover.  Pt has made good progress towards goals.   Rehab Potential Good   OT Frequency 2x / week   OT Duration 6 weeks   OT Treatment/Interventions Self-care/ADL training;Therapeutic exercise;Cognitive remediation/compensation;Visual/perceptual remediation/compensation;Neuromuscular education;Parrafin;Moist Heat;Fluidtherapy;Energy conservation;Therapeutic exercises;Patient/family education;Balance training;Therapeutic activities;Passive range of motion;Manual Therapy;DME and/or AE instruction;Ultrasound;Cryotherapy    Plan d/c OT   OT Home Exercise Plan issued PWR! modified quadraped, standing   Consulted and Agree with Plan of Care Patient      Patient will benefit from skilled therapeutic intervention in order to improve the following deficits and impairments:  Decreased cognition, Impaired flexibility, Decreased mobility, Decreased coordination, Decreased endurance, Decreased range of motion, Decreased strength, Impaired UE functional use, Impaired tone, Impaired perceived functional ability, Decreased safety awareness, Difficulty walking, Decreased balance  Visit Diagnosis: Other symptoms and signs involving the nervous system  Other symptoms and signs involving the musculoskeletal system  Other lack of coordination  Other abnormalities of gait and mobility  Abnormal posture    Problem List Patient Active Problem List   Diagnosis Date Noted  . Cerumen  impaction 09/13/2016  . Chronic venous insufficiency 09/13/2016  . Fatigue 03/15/2016  . Parkinsonian features 09/15/2015  . ED (erectile dysfunction) 09/15/2015  . MDD (major depressive disorder), recurrent episode, severe ([place]) 02/10/2014  . Nonspecific abnormal electrocardiogram (ECG) (EKG) 02/07/2014  . Non-compliant behavior 02/03/2014  . Morton's neuroma of left foot 08/07/2013  . 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 Hyperglycemia 01/26/2012  . Hypogonadism male 01/24/2012  . Syncope and collapse 01/20/2012  .  OSA (obstructive sleep apnea) 01/17/2011  . CHEST TIGHTNESS 02/18/2008    OCCUPATIONAL THERAPY DISCHARGE SUMMARY  Visits from Start of Care: 9  Current functional level related to goals / functional outcomes: See above   Remaining deficits: Bradykinesia, rigidity, decr coordination, decr balance for ADLs, decr posture, cognitive deficits   Education / Equipment: Pt was instructed in the following:  PD-specific HEP, adaptive strategies for ADLs/IADLs, ways to prevent future  complications, appropriate community resources.  Pt verbalized understanding of all education provided.    Plan: Patient agrees to discharge.  Patient goals were met. Patient is being discharged due to meeting the stated rehab goals.  Pt would benefit from OT re-evaluation in approx 6 months to assess for need for further therapy/functional changes due to progressive nature of diagnosis. ?????       [facility] 09/29/2016, 4:52 PM  [facility] [address] [place], [place], [address] Phone: [phone]   Fax:  [phone]  Name: KAVONTE BEARSE MRN: [id number] Date of Birth: October 09, 1950   Vianne Bulls, OTR/L [facility] [address] [place], [place]  [address] [phone] phone [phone] 09/29/16 4:52 PM

## 2016-09-29 NOTE — Telephone Encounter (Signed)
Agree but if he wants to hold that, its fine.  Generally [facility] will pay q 6 months for therapy

## 2016-09-29 NOTE — Therapy (Signed)
[facility] [address] [place], [place], [address] Phone: [phone]   Fax:  [phone]  Physical Therapy Treatment  Patient Details  Name: [name] [name] MRN: [id number] Date of Birth: [birth year] Referring Provider: Alonza Bogus , DO  Encounter Date: 09/29/2016      PT End of Session - 09/29/16 1302    Visit Number 10   Number of Visits 17   Date for PT Re-Evaluation 10/09/16   Authorization Type [employer]-75 visit limit (14 visits already used from PT ending 02/2016)-GCODE   PT Start Time 0938   PT Stop Time 1018   PT Time Calculation (min) 40 min   Activity Tolerance Patient tolerated treatment well   Behavior During Therapy [facility] for tasks assessed/performed      Past Medical History:  Diagnosis Date  .  OSA (obstructive sleep apnea) 01/17/2011   npsg 2012:  AHI 67/hr. Auto titration 2012:  Optimal pressure 12cm.   . APPENDECTOMY, HX OF 02/18/2008   Qualifier: Diagnosis of  By: [place], [place]    . Cardiac murmur    as a child  . Depression   . Headache(784.0)   . HEADACHES, HX OF 02/18/2008   Qualifier: Diagnosis of  By: Danny Lawless CMA, [place]    . Parkinson disease ([place]) 11/2014  . Streptococcal meningitis    as an infant    Past Surgical History:  Procedure Laterality Date  . APPENDECTOMY  1967  . NASAL SINUS SURGERY     x 4 as a child  . VASECTOMY      There were no vitals filed for this visit.      Subjective Assessment - 09/29/16 0936    Subjective Pt feels that he wants to discharge PT this visit.  I feel like I need to take a break from therapy.   Patient Stated Goals Pt's goals for therapy are to improve sense of balance and stair negotiation.   Currently in Pain? No/denies                         [facility] Adult PT Treatment/Exercise - 09/29/16 0001      Transfers   Transfers Sit to Stand;Stand to Sit   Sit to Stand 6: Modified independent (Device/Increase  time);From chair/3-in-1;Without upper extremity assist   Five time sit to stand comments  12.75   Stand to Sit 6: Modified independent (Device/Increase time);Without upper extremity assist;To chair/3-in-1     Ambulation/Gait   Ambulation/Gait Yes   Ambulation/Gait Assistance 6: Modified independent (Device/Increase time)   Ambulation Distance (Feet) 200 Feet   Assistive device None   Gait Pattern Step-through pattern;Decreased arm swing - right;Decreased arm swing - left;Decreased step length - right;Decreased step length - left;Decreased trunk rotation;Trunk flexed;Poor foot clearance - left;Poor foot clearance - right   Ambulation Surface Level;Indoor   Gait velocity 12.04 sec = 2.72 ft/sec   Stairs Yes   Stairs Assistance 6: Modified independent (Device/Increase time)   Stairs Assistance Details (indicate cue type and reason) Carrying weighted box   Stair Management Technique No rails;Alternating pattern;Step to pattern   Number of Stairs 4  2 reps   Height of Stairs 6     Standardized Balance Assessment   Standardized Balance Assessment Timed Up and Go Test     Timed Up and Go Test   TUG Normal TUG;Cognitive TUG   Normal TUG (seconds) 15.44   Cognitive TUG (seconds) 15.08  High Level Balance   High Level Balance Comments MiniBESTest score 24/28     Self-Care   Self-Care Other Self-Care Comments   Other Self-Care Comments  Discussed fall prevention education, tips to reduce freezing with gait, [employer] community exercise program information        Mini-BESTest: [employer]  2005-2013 [employer]. All rights reserved. ________________________________________________________________________________________Anticipatory_________Subscore____4_/6 1. SIT TO STAND Instruction: "Cross your arms across your chest. Try not to use your hands unless you must.Do not let your legs lean against the back of the chair when you stand. Please  stand up now." X(2) Normal: Comes to stand without use of hands and stabilizes independently. (1) Moderate: Comes to stand WITH use of hands on first attempt. (0) Severe: Unable to stand up from chair without assistance, OR needs several attempts with use of hands. 2. RISE TO TOES Instruction: "Place your feet shoulder width apart. Place your hands on your hips. Try to rise as high as you can onto your toes. I will count out loud to 3 seconds. Try to hold this pose for at least 3 seconds. Look straight ahead. Rise now." (2) Normal: Stable for 3 s with maximum height. X(1) Moderate: Heels up, but not full range (smaller than when holding hands), OR noticeable instability for 3 s. (0) Severe: < 3 s. 3. STAND ON ONE LEG Instruction: "Look straight ahead. Keep your hands on your hips. Lift your leg off of the ground behind you without touching or resting your raised leg upon your other standing leg. Stay standing on one leg as long as you can. Look straight ahead. Lift now." Left: Time in Seconds Trial 1:___2.82__Trial 2:__1.46___ (2) Normal: 20 s. X(1) Moderate: < 20 s. (0) Severe: Unable. Right: Time in Seconds Trial 1:_5.14____Trial 2:__3.73___ (2) Normal: 20 s. X(1) Moderate: < 20 s. (0) Severe: Unable To score each side separately use the trial with the longest time. To calculate the sub-score and total score use the side [left or right] with the lowest numerical score [i.e. the worse side]. ______________________________________________________________________________________Reactive Postural Control___________Subscore:___6__/6 4. COMPENSATORY STEPPING CORRECTION- FORWARD Instruction: "Stand with your feet shoulder width apart, arms at your sides. Lean forward against my hands beyond your forward limits. When I let go, do whatever is necessary, including taking a step, to avoid a fall." X(2) Normal: Recovers independently with a single, large step (second realignment step is  allowed). (1) Moderate: More than one step used to recover equilibrium. (0) Severe: No step, OR would fall if not caught, OR falls spontaneously. 5. COMPENSATORY STEPPING CORRECTION- BACKWARD Instruction: "Stand with your feet shoulder width apart, arms at your sides. Lean backward against my hands beyond your backward limits. When I let go, do whatever is necessary, including taking a step, to avoid a fall." X(2) Normal: Recovers independently with a single, large step. (1) Moderate: More than one step used to recover equilibrium. (0) Severe: No step, OR would fall if not caught, OR falls spontaneously. 6. COMPENSATORY STEPPING CORRECTION- LATERAL Instruction: "Stand with your feet together, arms down at your sides. Lean into my hand beyond your sideways limit. When I let go, do whatever is necessary, including taking a step, to avoid a fall." Left X(2) Normal: Recovers independently with 1 step (crossover or lateral OK). (1) Moderate: Several steps to recover equilibrium. (0) Severe: Falls, or cannot step. Right X(2) Normal: Recovers independently with 1 step (crossover or lateral OK). (1) Moderate: Several steps to recover equilibrium. (0) Severe: Falls,  or cannot step. Use the side with the lowest score to calculate sub-score and total score. ____________________________________________________________________________________Sensory Orientation_____________Subscore:_______6__/6 7. STANCE (FEET TOGETHER); EYES OPEN, FIRM SURFACE Instruction: "Place your hands on your hips. Place your feet together until almost touching. Look straight ahead. Be as stable and still as possible, until I say stop." Time in seconds:________ X(2) Normal: 30 s. (1) Moderate: < 30 s. (0) Severe: Unable. 8. STANCE (FEET TOGETHER); EYES CLOSED, FOAM SURFACE Instruction: "Step onto the foam. Place your hands on your hips. Place your feet together until almost touching. Be as stable and still as possible,  until I say stop. I will start timing when you close your eyes." Time in seconds:________ X(2) Normal: 30 s. (1) Moderate: < 30 s. (0) Severe: Unable. 9. INCLINE- EYES CLOSED Instruction: "Step onto the incline ramp. Please stand on the incline ramp with your toes toward the top. Place your feet shoulder width apart and have your arms down at your sides. I will start timing when you close your eyes." Time in seconds:________ X(2) Normal: Stands independently 30 s and aligns with gravity. (1) Moderate: Stands independently <30 s OR aligns with surface. (0) Severe: Unable. _________________________________________________________________________________________Dynamic Gait ______Subscore_____8___/10 10. CHANGE IN GAIT SPEED Instruction: "Begin walking at your normal speed, when I tell you 'fast', walk as fast as you can. When I say 'slow', walk very slowly." (2) Normal: Significantly changes walking speed without imbalance. X(1) Moderate: Unable to change walking speed or signs of imbalance. (0) Severe: Unable to achieve significant change in walking speed AND signs of imbalance. [place] - HORIZONTAL Instruction: "Begin walking at your normal speed, when I say "right", turn your head and look to the right. When I say "left" turn your head and look to the left. Try to keep yourself walking in a straight line." (2) Normal: performs head turns with no change in gait speed and good balance. X(1) Moderate: performs head turns with reduction in gait speed. (0) Severe: performs head turns with imbalance. 12. WALK WITH PIVOT TURNS Instruction: "Begin walking at your normal speed. When I tell you to 'turn and stop', turn as quickly as you can, face the opposite direction, and stop. After the turn, your feet should be close together." X(2) Normal: Turns with feet close FAST (< 3 steps) with good balance. (1) Moderate: Turns with feet close SLOW (>4 steps) with good balance. (0)  Severe: Cannot turn with feet close at any speed without imbalance. 13. STEP OVER OBSTACLES Instruction: "Begin walking at your normal speed. When you get to the box, step over it, not around it and keep walking." X(2) Normal: Able to step over box with minimal change of gait speed and with good balance. (1) Moderate: Steps over box but touches box OR displays cautious behavior by slowing gait. (0) Severe: Unable to step over box OR steps around box. 14. TIMED UP & GO WITH DUAL TASK [3 METER WALK] Instruction TUG: "When I say 'Go', stand up from chair, walk at your normal speed across the tape on the floor, turn around, and come back to sit in the chair." Instruction TUG with Dual Task: "Count backwards by threes starting at ___. When I say 'Go', stand up from chair, walk at your normal speed across the tape on the floor, turn around, and come back to sit in the chair. Continue counting backwards the entire time." TUG: ________seconds; Dual Task TUG: ________seconds X(2) Normal: No noticeable change in sitting, standing or walking while  backward counting when compared to TUG without Dual Task. (1) Moderate: Dual Task affects either counting OR walking (>10%) when compared to the TUG without Dual Task. (0) Severe: Stops counting while walking OR stops walking while counting. When scoring item 14, if subject's gait speed slows more than 10% between the TUG without and with a Dual Task the score should be decreased by a point. TOTAL SCORE: ____24____/28         PT Education - 09/29/16 1301    Education provided Yes   Education Details Fall prevention, tips to reduce freezing, [employer] community fitness options, plans to d/c this visit   Person(s) Educated Patient   Methods Explanation;Handout   Comprehension Verbalized understanding          PT Short Term Goals - 09/15/16 0929      PT SHORT TERM GOAL #1   Title Pt will be independent with HEP for balance, functional  strengthening and gait.  TARGET 09/08/16   Baseline Pt performs consistenly bike at home and goes to gym but has not followed through with HEP from rehab, 09/15/16.   Time 4   Period Weeks   Status Partially Met     PT SHORT TERM GOAL #2   Title Pt will improve TUG score to less than or equal to 13.5 seconds for decreased fall risk.   Baseline TUG 14.5, 09/15/16.   Time 4   Period Weeks   Status Not Met     PT SHORT TERM GOAL #3   Title Pt will improve single limb stance to at least 3 seconds on LLE for improved stair negotiation and obstacle negotiation.   Baseline < 3 seconds on LLE, 09/15/16.   Time 4   Period Weeks   Status Not Met     PT SHORT TERM GOAL #4   Title Pt will negotiate 4 steps,carrying objects resembling boxes or laundry that he would carry at home, step to pattern, with supervision.   Baseline MEt, 09/15/16.   Time 4   Period Weeks   Status Achieved           PT Long Term Goals - 09/29/16 0865      PT LONG TERM GOAL #1   Title Pt will verbalize understanding of fall prevention/tips to reduce freezing with gait.  TARGET 10/09/16   Time 8   Period Weeks   Status Achieved     PT LONG TERM GOAL #2   Title Pt will improve TUG cognitive to less than or equal to 15 seconds for decreased fall risk.   Baseline 15.08 sec 09/29/16   Time 8   Period Weeks   Status Not Met     PT LONG TERM GOAL #3   Title Pt will improve MiniBESTest to at least 24/28 for decreased fall risk.   Baseline 24/28 09/29/16   Time 8   Period Weeks   Status Achieved     PT LONG TERM GOAL #4   Title Pt will negotiate at least 4 steps carrying objects simulating household items, step to pattern, modified independently no LOB.   Time 8   Period Weeks   Status Achieved     PT LONG TERM GOAL #5   Title Pt will improved gait velocity to at least 2.62 ft/sec for improved gait efficiency and safety.   Baseline 2.72 ft/sec 09/29/16   Time 8   Period Weeks   Status Achieved  Plan - 10/27/16 1304    Clinical Impression Statement Pt requests to discharge this visit.  Assessed LTGs, with pt meeting LTG 1, 3, 4, 5.  LTG not met, with TUG cognitive score 15.08 sec.  Pt demonstrates some improvement in functional measures, but  pt has not been consistently doing HEP.  Encouraged patient to continue to HEP and participate in community exercise program.  Pt may benefit from return PT eval in 6-9 months due to progressive nature of disease.   PT Frequency 2x / week   PT Duration 8 weeks   PT Treatment/Interventions ADLs/Self Care Home Management;Functional mobility training;Gait training;Stair training;Therapeutic activities;Therapeutic exercise;Balance training;Neuromuscular re-education;Patient/family education   PT Next Visit Plan Discharge this visit; follow up PT eval in 6 months.   Consulted and Agree with Plan of Care Patient      Patient will benefit from skilled therapeutic intervention in order to improve the following deficits and impairments:  Abnormal gait, Decreased balance, Decreased mobility, Difficulty walking, Decreased strength, Postural dysfunction  Visit Diagnosis: Other abnormalities of gait and mobility  Abnormal posture       G-Codes - October 27, 2016 1307    Functional Assessment Tool Used gait velocity 2.72 sec, TUG cog 15.08 sec, MiniBESTest 24/28   Functional Limitation Mobility: Walking and moving around   Mobility: Walking and Moving Around Goal Status [phone]) At least 20 percent but less than 40 percent impaired, limited or restricted   Mobility: Walking and Moving Around Discharge Status [phone]) At least 20 percent but less than 40 percent impaired, limited or restricted      Problem List Patient Active Problem List   Diagnosis Date Noted  . Cerumen impaction 09/13/2016  . Chronic venous insufficiency 09/13/2016  . Fatigue 03/15/2016  . Parkinsonian features 09/15/2015  . ED (erectile dysfunction) 09/15/2015  .  MDD (major depressive disorder), recurrent episode, severe ([place]) 02/10/2014  . Nonspecific abnormal electrocardiogram (ECG) (EKG) 02/07/2014  . Non-compliant behavior 02/03/2014  . Morton's neuroma of left foot 08/07/2013  . 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 Hyperglycemia 01/26/2012  . Hypogonadism male 01/24/2012  . Syncope and collapse 01/20/2012  .  OSA (obstructive sleep apnea) 01/17/2011  . CHEST TIGHTNESS 02/18/2008    MARRIOTT,AMY W. 10-27-2016, 1:09 PM  [facility]razier Butt., PT [facility] [address] [place], [place], [address] Phone: [phone]   Fax:  [phone]  Name: [name] [name] MRN: [id number] Date of Birth: 02-Dec-1950   PHYSICAL THERAPY DISCHARGE SUMMARY  Visits from Start of Care: 10  Current functional level related to goals / functional outcomes: Pt has met 4 of 5 long term goals-see above   Remaining deficits: Bradykinesia, rigidity, posture, timing/coordination of gait, with pt reporting episodes of dizziness at times (positional vertigo testing and oculomotor testing do not provoke symptoms)   Education / Equipment: Pt educated in HEP, community fitness, fall prevention  Plan: Patient agrees to discharge.  Patient goals were partially met. Patient is being discharged due to being pleased with the current functional level.  ?????Recommend return PT eval in 6 months due to progressive nature of disease.  Mady Haagensen, PT October 27, 2016 2:37 PM Phone: [phone] Fax: [phone]

## 2016-10-05 ENCOUNTER — Encounter: Payer: Self-pay | Admitting: Occupational Therapy

## 2016-10-05 ENCOUNTER — Encounter: Payer: Self-pay | Admitting: Speech Pathology

## 2016-10-05 ENCOUNTER — Ambulatory Visit: Payer: Self-pay | Admitting: Physical Therapy

## 2016-10-07 ENCOUNTER — Encounter: Payer: Self-pay | Admitting: Occupational Therapy

## 2016-10-07 ENCOUNTER — Ambulatory Visit: Payer: Self-pay | Admitting: Physical Therapy

## 2016-10-11 ENCOUNTER — Encounter: Payer: Self-pay | Admitting: Speech Pathology

## 2016-10-11 ENCOUNTER — Encounter: Payer: Self-pay | Admitting: Occupational Therapy

## 2016-10-11 ENCOUNTER — Ambulatory Visit: Payer: Federal, State, Local not specified - PPO | Admitting: Physical Therapy

## 2016-10-13 ENCOUNTER — Ambulatory Visit: Payer: Self-pay | Admitting: Physical Therapy

## 2016-10-13 ENCOUNTER — Encounter: Payer: Self-pay | Admitting: Occupational Therapy

## 2016-11-17 ENCOUNTER — Other Ambulatory Visit (HOSPITAL_COMMUNITY): Payer: Self-pay | Admitting: Psychiatry

## 2016-11-17 DIAGNOSIS — F332 Major depressive disorder, recurrent severe without psychotic features: Secondary | ICD-10-CM

## 2016-11-18 ENCOUNTER — Other Ambulatory Visit (HOSPITAL_COMMUNITY): Payer: Self-pay | Admitting: Psychiatry

## 2016-12-02 NOTE — Progress Notes (Signed)
[name] [name] was seen today in the movement disorders clinic for neurologic consultation at the request of [name], MD.  The consultation is for the evaluation of slowness, flat affect and to r/o a neurologic disorder.  This patient is accompanied in the office by his spouse who supplements the history.  The first symptom(s) the patient noticed was a feeling of weakness that has been going on for about a year.  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  For about a year and a half he has noted a "body" tremor and his wife has noted a hand tremor in both hands (at rest).  He is stiff like he is "a 66 year old man."  Once he is in bed he finds it is hard to move.  The patient has a hx of significant depression and is on multiple medications related to this.  He was started on cogentin just yesterday, but is also on celexa, wellbutrin and latuda.  He has been on [place] for 1.5 months.  Prior to that he was on abilify (tried on 2 separate times but most recently was only on it for a day) and geodon(doesn't remember this but looks like it may have been given in the past).  He has been on risperdal but cannot remember how long ago.  ECT was offered but his wife does not want him to proceed with that  11/20/14 update:  The patient returns today for follow-up.  He is accompanied by his wife who supplements the history.  He has been off of [place] for about 2 weeks.  Unfortunately, he was unable to afford the dat scan.  He is on just Wellbutrin and Celexa for depression and his Cogentin and has been discontinued.  He has noted a decreased appetite and dry mouth and having trouble sleeping because of stiffness.  Pt states that he is almost afraid to go to sleep.  Missing 2 days of work per week (works nights) but a lot of that because of anxiety.  His wife notices increased tremor.  He has slow at work.  Admits to some  lightheadedness but no syncope.  Note diplopia.  No hallucinations.  His wife is doing most of the driving because he is having difficulty staying in his own lane.  02/19/15 update:  The patient is following up today regarding his parkinsonism.  I reviewed records from his psychiatrist since last visit.  His psychiatry note from 12/04/14 indicates that the patient stated that tremor was better.  However, I received a call 4 days later stating that tremor was worse and he thought it was from the Mirapex.  He wanted to change the Mirapex to something else.  He was on Mirapex 0.5 mg 3 times per day at the time.  We ended up switching it to Requip 1 mg 3 times a day.  Fortunately he is doing better.  He denies any side effects with the Requip.  He currently takes it at 8 AM/2 PM/8 PM.  He does state that he notices that he "shuffles with the left leg."  He has not fallen but sometimes feels off balance.  He states that he finished his physical/occupational/speech therapy and feels that it went well.  He is not exercising on his own.  He initially states that he does not feel that he has time, but also states that he does not  go into work until 2 PM and works until 10:30 PM.  This is a change for him.  He was able to change his job at [employer] and seems like that is doing better.  His wife mentions that he continues to have memory loss that she thinks has increased, but she also states that she has noticed memory loss ever since starting on antidepressants.  He had one fall since last visit.  This was when he was taking the trash to the street.  He had no fractures with this, just a superficial scrape.    05/25/15 update:  The patient presents today, accompanied by his wife who supplements the history.  His Requip was increased last visit so that was taking 2 tablets in the morning, one in afternoon and 2 in the evening and 1 day after I started this, I received a call from his wife that the patient did not  tolerate it and it made symptoms worse (which is what they reported with Mirapex).  While that was very odd to me, I told them to go back down to 1 mg 3 times per day.  He actually comes back on 1 mg, 1 in the AM, 2 in the afternoon and 1 in the evening.  He reports that he is doing worse.  He has more tremor and he feels tired in the evening.  Pts wife asks about trying to go back up on the requip slowly.   He had neuropsych testing in June at [facility].  I reviewed that.  There was no evidence of dementia.  There was evidence of mild cognitive impairment from Parkinson's disease as well as from chronic major depressive disorder.  It is recommended that the patient consider transcranial magnetic stimulation for depression, and he told the neuropsychologist that he considered in the past and opted against it.  She encouraged him to attend a Parkinson's support group and he told her that he tried that once and it was not helpful.  He still c/o "my memory has been terrible and especially over the last few weeks."  He feels that he is not depressed - "that is well controlled."  He is not doing CV exercise.  He is still in his new job but it seems more physical than his old job and he thinks he is not as fast in getting tasks done.  He asks about DBS.  08/25/15 update:  The patient is following up today, accompanied by his wife who supplements the history.  I have reviewed records since our last visit.  He is on Requip XL, which was increased to 6 mg at night last visit.  He changed that back to 2 mg tid of the regular ropinirole because of cost.   He is enrolled in physical therapy.  "I'm doing bad."  When asked to describe how he feels bad, he has trouble describing it.  He had a root canal and hasn't felt well since.  He hasn't been back to work for 4 weeks.  He feels that his speech is low and states that he went to voice therapy 2 weeks ago.  He isn't practicing the voice therapy faithfully.  He is  riding the bike on the highest resistance 20 mins a day.  He is having word finding trouble.   No falls but feels that he is "weaker."  He asks me about going to "light duty" at work and possibly retirement in the near future.  He  continues to see psychiatry for his depression.  He last saw psychiatry on 06/04/2015.  No changes were made in medication as he stated that mood was good and continues to reaffirm that.    11/30/15 update:  The patient follows up today, accompanied by his wife who supplements the history.  Last visit, his ropinirole was increased to 4 mg in the morning (8am but goes back to bed for another 1 hour or so), another 2 mg in the afternoon (1-2 pm) and evening (8-8:30pm).  I changed him to light duty at work last visit at his request.  He called me recently and wanted changed from being able to work 4 hours per day to a total of only 10 hours per week.  I told him that he would need a functional capacity evaluation for this, as I was not sure that this made sense from a Parkinson standpoint, given his stage of Parkinson's disease.  He was fairly frustrated with this recommendation, but I told him that I needed some objective evidence.  He reports that he is awaiting an appt for the FCE (has called).   He has been seeing psychiatry and I  actually received a call from his psychiatrist back in December that his psychiatrist felt that he needed to be on levodopa.  I did not think that was the case, but I was willing to do a levodopa challenge to see if that was the case.  He was scheduled to have that done, but the patient canceled that.  Does report that he thinks that depression under good control.   He denies falls.  Some lightheadedness when first gets up, but no near syncope.  Using bike daily for 15-20 minutes.  No hallucinations; rare visual distortions.  Wife thinks that he seems more stiff and has involuntary "tics and shaking" at night.  Waking up with AM headache.  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  03/29/16 update:  The patient follows up today, accompanied by his wife who supplements the history.  Last visit, his ropinirole was increased to 4 mg in the morning, 2 mg in the afternoon and 2 mg in the evening.  Has intermittent spells of increased stiffness, slower on stairs.  Riding recumbant bike at home.  He has been seeing psychiatry and is still on celexa and wellbutrin.  I reviewed last records with Dr. Adele Schilder and pt continued to c/o memory issues (long standing c/o) and was told to discuss with me as may be PD related.  Pt had neuropsych testing done about a year ago and demonstrated no neurodegenerative memory change related to parkinsons but did feel that depression was an issue affecting cognition.  Thinks that memory is worse. No falls since last visit.  He has attended PT/OT/ST from March-May.  He c/o drooling.  Also c/o talking at night and some tremor at night.  Happens most night.  Also c/o snoring and wife thinks that is due to weight and trying to cut back on sugars.  Has OSAS but doesn't use the CPAP.  Has some visual distortions but no hallucinations.  Can be frightening.  Having some urinary incontinence.  Saw urology in the past but not for incontinence.    05/30/16 update:  The patient follows up today, accompanied by his wife who supplements the history.  He is on ropinirole, 4 mg in the morning, 2 mg in the afternoon and 2 mg in the evening.  He denies compulsive behaviors.  He denies sleep attacks.  He is now attending the [employer] gym and feels he is getting benefit from this.  He is getting scholarship information.  He had repeat neuropsych testing on August 2 with a follow-up with Dr. Si Raider on August 10.  This was largely consistent with his performance one year prior.  There is mild cognitive impairment consistent with Parkinson's disease and untreated obstructive sleep apnea syndrome.  There was evidence of major depressive disorder,  and Dr. Si Raider felt that there was long-standing personality issues that influenced the patient's view of the world in a negative fashion.  He saw urology since last visit and I did get a note from them.  It was not clear what medication he was tried on, just stated that he tried a beta 3 agonist (?  Myrbetriq).  Pt states that it was in fact myrbetriq that was added.  Pt states that it hasn't helped but wife thinks that he isn't getting up as much during the night.  He was getting up 3 times during the night and now he may get up 1-2 times during the night.  Having some dizziness when gets up for about 2 min.  Drinking 2 L of water a day.  Given up sugar completely.  I did order a split night PSG last time but results are not available.  He c/o increased drooling.  09/14/16 update:  Patient follows up today, accompanied by his wife who supplements the history.  He is on ropinirole, 4 mg in the morning, 2 mg in the afternoon and 2 mg in the evening.  Pt had one fall on the ice when it snowed but that was it.  Pt denies lightheadedness, near syncope.  No hallucinations.  Mood has been good.  He remains on a combination of Wellbutrin and Celexa for depression and thinks that this has been well controlled.  He saw psychiatry since our last visit.  He was referred to psychology for counseling.  He had a functional capacity evaluation on 09/01/2016.  Stated that the patient could exert up to 20 pounds of force occasionally and up to 10 pounds of force frequently, but it also stated that physical demand requirements are in excess of those for sedentary work.  It stated that any job that the patient should engage in should be rated "light work" when it requires walking or standing to a significant degree, when it requires sitting most of the time but entails pushing and/or pulling of leg or arm controls and/or when the job requires working at a production rate paced entailing constant pushing and/or pulling a materials  even if the weight of his materials is negligible.  It did state that based on the evaluation, the client was capable of sustaining light level of work for an 8 hour per day, 40 hour per week job.  Asks me about drooling.  Does not want to try botox.  Asks me about cough and swollen ankles.  C/o talking in sleep.  Will waken him out sleep.  Having trouble getting in and out of the cars (not so much his truck)..  Hits his head on the door jam.  Started PT last week.  Still very sleepy during the day.    12/06/16 update:  Patient has up today, accompanied by his wife who supplements the history.  Patient remains on ropinirole, 4 mg in the morning, 2 mg in the afternoon and 2 mg in the evening. Having  some illusions but once gets close on the object he will realize what it is.  Wife notes some increased tremor but patient doesn't.   He has 2 falls since our last visit.  One he tripped over a curb and the other he fell down stairs but he was close to the bottom and didn't get hurt.  He is working out and doing exercises.  He has issues with drooling, but does not want to pursue Myobloc, nor does he want Robinul.  He did attend rehabilitation since our last visit and I reviewed those records.  Still going to ACT bid.  Feels like speech is slurring.   Neuroimaging has previously been performed.  It is available for my review today and I reviewed it with him.  There is no BG disease.  ALLERGIES:  No Known Allergies  CURRENT MEDICATIONS:  Outpatient Encounter Prescriptions as of 12/06/2016  Medication Sig  . B Complex-C (B-COMPLEX WITH VITAMIN C) tablet Take 1 tablet by mouth daily.  Marland Kitchen buPROPion (WELLBUTRIN XL) 300 MG 24 hr tablet TAKE 1 TABLET BY MOUTH EVERY DAY IN THE MORNING  . citalopram (CELEXA) 40 MG tablet TAKE 1 TABLET (40 MG TOTAL) BY MOUTH DAILY.  . fesoterodine (TOVIAZ) 8 MG TB24 tablet Take 8 mg by mouth daily.  . Omega 3-6-9 Fatty Acids (OMEGA 3-6-9 PO) Take by mouth.  Marland Kitchen rOPINIRole (REQUIP) 2 MG  tablet TAKE 2 TABLETS IN THE AM, 1 IN THE AFTERNOON AND 1 IN THE EVENING  . TURMERIC CURCUMIN PO Take by mouth.  . [DISCONTINUED] carbamide peroxide (DEBROX) 6.5 % otic solution Place 5 drops into the right ear 2 (two) times daily. (Patient not taking: Reported on 09/14/2016)  . [DISCONTINUED] fesoterodine (TOVIAZ) 4 MG TB24 tablet Take 4 mg by mouth daily.   No facility-administered encounter medications on file as of 12/06/2016.     PAST MEDICAL HISTORY:   Past Medical History:  Diagnosis Date  .  OSA (obstructive sleep apnea) 01/17/2011   npsg 2012:  AHI 67/hr. Auto titration 2012:  Optimal pressure 12cm.   . APPENDECTOMY, HX OF 02/18/2008   Qualifier: Diagnosis of  By: [place], [place]    . Cardiac murmur    as a child  . Depression   . Headache(784.0)   . HEADACHES, HX OF 02/18/2008   Qualifier: Diagnosis of  By: Danny Lawless CMA, [place]    . Parkinson disease ([place]) 11/2014  . Streptococcal meningitis    as an infant    PAST SURGICAL HISTORY:   Past Surgical History:  Procedure Laterality Date  . APPENDECTOMY  1967  . NASAL SINUS SURGERY     x 4 as a child  . VASECTOMY      SOCIAL HISTORY:   Social History   Social History  . Marital status: Married    Spouse name: N/A  . Number of children: Y  . Years of education: N/A   Occupational History  . 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 Smoking status: Never Smoker  . Smokeless tobacco: Never Used  . Alcohol use No  . Drug use: No  . Sexual activity: Yes    Birth control/ protection: None   Other Topics Concern  . Not on file   Social History Narrative  . No narrative on file    FAMILY HISTORY:   Family Status  Relation Status  . Father Deceased   lung cancer, alzheimer's  .  Mother Alive   HTN, A fib  . Brother Deceased   accident  . Sister Alive   healthy  . Son Alive   healthy  . Daughter Alive   healthy  . Daughter Alive   healthy  . Maternal Aunt Alive    Parkinson's Disease    ROS:  A complete 10 system review of systems was obtained and was unremarkable apart from what is mentioned above.  PHYSICAL EXAMINATION:    VITALS:   Vitals:   12/06/16 0908  BP: 126/80  Pulse: 72  SpO2: 92%  Weight: 287 lb (130.2 kg)  Height: 6\' 2"  (1.88 m)   Wt Readings from Last 3 Encounters:  12/06/16 287 lb (130.2 kg)  09/14/16 285 lb (129.3 kg)  09/13/16 285 lb (129.3 kg)     GEN:  The patient appears stated age and is in NAD. HEENT:  Normocephalic, atraumatic.  The mucous membranes are moist. The superficial temporal arteries are without ropiness or tenderness. CV:  RRR Lungs:  CTAB Neck/HEME:  There are no carotid bruits bilaterally.  Neurological examination:  Orientation: The patient is alert and oriented x3.  Cranial nerves: There is good facial symmetry. 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   The speech is fluent and clear.  He is hypophonic.  The patient is able to make the gutteral sounds without difficulty. Soft palate rises symmetrically and there is no tongue deviation. Hearing is intact to conversational tone. Sensation: Sensation is intact to light touch throughout Motor: Strength is 5/5 in the bilateral upper and lower extremities.   Shoulder shrug is equal and symmetric.  There is no pronator drift.   Movement examination: Tone: There is normal tone bilaterally Abnormal movements: There is no tremor today Coordination:  There is decremation with RAM's, seen with hand opening and closing on the left, finger taps on the left, alternation of supination/pronation on the left, heel taps and toe taps on the left. Gait and Station: The patient has mild difficulty arising out of a deep-seated chair without the use of the hands.   The patient's stride length is decreased with decreased arm swing bilaterally but more so on the L.  He is more slow and purposeful today  ASSESSMENT/PLAN:  1.  Parkinsonism  - Continue requip 4mg /2mg /2mg .   Thinks tremor is improved.  -tallked about starting low dose levodopa due to more falls, balance, trouble getting in/out of chairs and fatigue and illusions with requip.  Would start with carbidopa/levodopa 25/100 tid.  Risks, benefits, side effects and alternative therapies were discussed.  The opportunity to ask questions was given and they were answered to the best of my ability.  The patient decided to hold on that for now.  He asked me about increasing the requip instead but I didn't want to do that as it could make his illusions worse so he decided to not change his medications for now.    -he is still in ACT classes with scholarship program and congratulated him on that 2  Depression  -The patient reports that this has been "resolved."  He is not suicidal or homicidal.  He will remain on Celexa and Wellbutrin and remain under the care of psychiatry. 3.  OSAS  - Had study on 05/11/16 but results STILL not in the system.  There is a scanned document but appear that these are just tech notes.  Seems that perhaps pt didn't sleep enough on the study to get results.  Don't see where AHI is  even recorded.  Will have my MA contact sleep center and ask them about this. 4.  Sialorrhea  -refuses botox but has asked me multiple times about what to do about this.  Will let me know if changes mind  -offered robinul but doesn't want to to try it. 5.  Dizziness  -drinking plenty of fluid and markedly improved with that  -refuses compression stocking/refuses abdominal compression binder 6.  Memory loss  -He had repeat neuropsych testing on August 2 with a follow-up with Dr. Si Raider on August 10.  This was largely consistent with his performance one year prior.  There is mild cognitive impairment consistent with Parkinson's disease and untreated obstructive sleep apnea syndrome.  There was evidence of major depressive disorder, and Dr. Si Raider felt that there was long-standing personality issues that influenced the  patient's view of the world in a negative fashion.  There was not evidence of dementia at this point in time. 7.  REM behavior d/o  -just talking at night.  Doesn't want any medication.  Educated patient.  Will let me know if starts acting out the dreams/falling out of bed 7.  Urinary incontinence, frequency and nocturia  -following with [facility] and on toviaz and is helping 8.  Follow-up with me will be in the next 4 months, sooner should new neurologic issues arise.      Much greater than 50% of this visit was spent in counseling with the patient and the family.  Total face to face time:  35 min.

## 2016-12-06 ENCOUNTER — Ambulatory Visit (INDEPENDENT_AMBULATORY_CARE_PROVIDER_SITE_OTHER): Payer: Federal, State, Local not specified - PPO | Admitting: Neurology

## 2016-12-06 ENCOUNTER — Encounter: Payer: Self-pay | Admitting: Neurology

## 2016-12-06 VITALS — BP 126/80 | HR 72 | Ht 74.0 in | Wt 287.0 lb

## 2016-12-06 DIAGNOSIS — F331 Major depressive disorder, recurrent, moderate: Secondary | ICD-10-CM

## 2016-12-06 DIAGNOSIS — G20 Parkinson's disease: Secondary | ICD-10-CM

## 2016-12-07 ENCOUNTER — Ambulatory Visit (INDEPENDENT_AMBULATORY_CARE_PROVIDER_SITE_OTHER): Payer: Federal, State, Local not specified - PPO | Admitting: Psychiatry

## 2016-12-07 ENCOUNTER — Encounter (HOSPITAL_COMMUNITY): Payer: Self-pay | Admitting: Psychiatry

## 2016-12-07 DIAGNOSIS — Z813 Family history of other psychoactive substance abuse and dependence: Secondary | ICD-10-CM | POA: Diagnosis not present

## 2016-12-07 DIAGNOSIS — F332 Major depressive disorder, recurrent severe without psychotic features: Secondary | ICD-10-CM

## 2016-12-07 DIAGNOSIS — Z811 Family history of alcohol abuse and dependence: Secondary | ICD-10-CM

## 2016-12-07 DIAGNOSIS — Z79899 Other long term (current) drug therapy: Secondary | ICD-10-CM

## 2016-12-07 MED ORDER — CITALOPRAM HYDROBROMIDE 40 MG PO TABS: 40.0000 mg | ORAL_TABLET | Freq: Every day | ORAL | 0 refills | Status: DC

## 2016-12-07 MED ORDER — BUPROPION HCL ER (XL) 300 MG PO TB24: ORAL_TABLET | ORAL | 0 refills | Status: DC

## 2016-12-07 NOTE — Progress Notes (Signed)
BH MD/PA/NP OP Progress Note  12/07/2016 8:42 AM [name] [name]  MRN:  [id number]  Chief Complaint:  Subjective:  I am doing good.  My depression is stable.  HPI: [name] [name] came for her follow-up appointment.  He is taking his Celexa and Wellbutrin and reported no side effects.  He is concerned about his gradually decline in his memory but otherwise no major stressors in his life.  His depression is stable.  He denies any irritability, anger, mania, psychosis or any crying spells.  He sleeps 5-6 hours.  He admitted some time forgetful but he is handling his memory and his illness much better.  He admitted not using his CPAP machine because he does not like the mask.  He's been following neurology and recently seen his primary care physician.  He had blood work which was normal.  His hemoglobin A1c is 5.9.  Patient has tremors which is due to his Parkinson.  He is not seeing any counselor and does not feel that he need any therapy.  He recently finished neurocognitive therapy which help him a lot.  His appetite is okay.  His vital signs are stable.  He denies any suicidal thoughts or homicidal thought.  He still drives but he admitted some time forgetful.  Patient denies drinking alcohol or using any illegal substances.  He lives with his wife who is very supportive.  Visit Diagnosis:    ICD-9-CM ICD-10-CM   1. Major depressive disorder, recurrent severe without psychotic features ([place]) 296.33 F33.2 citalopram (CELEXA) 40 MG tablet     buPROPion (WELLBUTRIN XL) 300 MG 24 hr tablet    Past Psychiatric History: Reviewed. Patient denies any history of suicidal attempt or any inpatient psychiatric treatment.  He denies any history of mania, psychosis, paranoia or any hallucination.  In the past he had tried Effexor, Abilify, Paxil, Cymbalta, Zoloft, amitriptyline, Pristiq, Britnellex, Celexa, Deplin and Vyvanse.  He is seen in this office since September 2009.  He has seen multiple psychiatrists in the  past.   Past Medical History:  Past Medical History:  Diagnosis Date  .  OSA (obstructive sleep apnea) 01/17/2011   npsg 2012:  AHI 67/hr. Auto titration 2012:  Optimal pressure 12cm.   . APPENDECTOMY, HX OF 02/18/2008   Qualifier: Diagnosis of  By: [place], [place]    . Cardiac murmur    as a child  . Depression   . Headache(784.0)   . HEADACHES, HX OF 02/18/2008   Qualifier: Diagnosis of  By: Danny Lawless CMA, [place]    . Parkinson disease ([place]) 11/2014  . Streptococcal meningitis    as an infant    Past Surgical History:  Procedure Laterality Date  . APPENDECTOMY  1967  . NASAL SINUS SURGERY     x 4 as a child  . VASECTOMY      Family Psychiatric History: Reviewed.  Family History:  Family History  Problem Relation Age of Onset  . Lung cancer Father   . Alcohol abuse Brother   . Drug abuse Brother   . Seizures Daughter     Social History:  Social History   Social History  . Marital status: Married    Spouse name: N/A  . Number of children: Y  . Years of education: N/A   Occupational History  . 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 Smoking status: Never Smoker  . Smokeless tobacco: Never Used  . Alcohol use  No  . Drug use: No  . Sexual activity: Yes    Birth control/ protection: None   Other Topics Concern  . None   Social History Narrative  . None    Allergies: No Known Allergies  Metabolic Disorder Labs: Lab Results  Component Value Date   HGBA1C 5.9 09/13/2016   No results found for: PROLACTIN Lab Results  Component Value Date   CHOL 206 (H) 03/14/2016   TRIG 121.0 03/14/2016   HDL 53.90 03/14/2016   CHOLHDL 4 03/14/2016   VLDL 24.2 03/14/2016   LDLCALC 127 (H) 03/14/2016   LDLCALC 105 (H) 03/16/2015     Current Medications: Current Outpatient Prescriptions  Medication Sig Dispense Refill  . B Complex-C (B-COMPLEX WITH VITAMIN C) tablet Take 1 tablet by mouth daily.    Marland Kitchen buPROPion (WELLBUTRIN XL)  300 MG 24 hr tablet TAKE 1 TABLET BY MOUTH EVERY DAY IN THE MORNING 90 tablet 0  . citalopram (CELEXA) 40 MG tablet TAKE 1 TABLET (40 MG TOTAL) BY MOUTH DAILY. 90 tablet 0  . fesoterodine (TOVIAZ) 8 MG TB24 tablet Take 8 mg by mouth daily.    . Omega 3-6-9 Fatty Acids (OMEGA 3-6-9 PO) Take by mouth.    Marland Kitchen rOPINIRole (REQUIP) 2 MG tablet TAKE 2 TABLETS IN THE AM, 1 IN THE AFTERNOON AND 1 IN THE EVENING 360 tablet 1  . TURMERIC CURCUMIN PO Take by mouth.     No current facility-administered medications for this visit.     Neurologic: Headache: No Seizure: No Paresthesias: No  Musculoskeletal: Strength & Muscle Tone: decreased Gait & Station: shuffle Patient leans: Front and Backward  Psychiatric Specialty Exam: ROS  Blood pressure 132/80, pulse 71, height 6\' 2"  (1.88 m), weight 287 lb (130.2 kg).Body mass index is 36.85 kg/m.  General Appearance: Casual  Eye Contact:  Fair  Speech:  Slow  Volume:  Decreased  Mood:  Anxious  Affect:  Flat  Thought Process:  Descriptions of Associations: Intact  Orientation:  Full (Time, Place, and Person)  Thought Content: Rumination   Suicidal Thoughts:  No  Homicidal Thoughts:  No  Memory:  Immediate;   Fair Recent;   Fair Remote;   Fair  Judgement:  Fair  Insight:  Good  Psychomotor Activity:  Increased, Shuffling Gait and Tremor  Concentration:  Concentration: Fair and Attention Span: Fair  Recall:  Poor  Fund of Knowledge: Fair  Language: Fair  Akathisia:  No  Handed:  Right  AIMS (if indicated):  0  Assets:  Communication Skills Desire for Improvement Housing  ADL's:  Intact  Cognition: Impaired,  Moderate  Sleep:  Good     Assessment: Major depressive disorder, recurrent.  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  I review blood work results which was done recently by his primary care physician.  His hemoglobin see is normal.  Patient is not  interested in counseling and therapy.  I will continue Celexa 40 mg daily and Wellbutrin XL 300 mg daily.  Follow-up in 3 months. Discussed medication side effects and benefits.  Recommended to call us back if there is any question, concern or worsening of the symptoms.  Discuss safety plan that anytime having active suicidal thoughts or homicidal thoughts and she need to call 911 or go to the local emergency room.   [name] Sandy T., MD 12/07/2016, 8:42 AM

## 2017-02-09 ENCOUNTER — Telehealth: Payer: Self-pay | Admitting: Occupational Therapy

## 2017-02-09 DIAGNOSIS — G20 Parkinson's disease: Secondary | ICD-10-CM

## 2017-02-09 NOTE — Telephone Encounter (Signed)
Referral sent 

## 2017-02-09 NOTE — Telephone Encounter (Signed)
Dr. Carles Collet, At the time of our discharge from therapies we recommended PT, OT, and ST re-evals in 6 months. These evaluations are scheduled for 03/07/17. If you agree, please make referral for PT, OT, and ST. Sincerely, Theone Murdoch, OTR/L

## 2017-02-11 ENCOUNTER — Other Ambulatory Visit: Payer: Self-pay | Admitting: Neurology

## 2017-03-07 ENCOUNTER — Ambulatory Visit: Payer: Federal, State, Local not specified - PPO

## 2017-03-07 ENCOUNTER — Ambulatory Visit: Payer: Federal, State, Local not specified - PPO | Attending: Neurology | Admitting: Physical Therapy

## 2017-03-07 ENCOUNTER — Ambulatory Visit: Payer: Federal, State, Local not specified - PPO | Admitting: Occupational Therapy

## 2017-03-07 DIAGNOSIS — R278 Other lack of coordination: Secondary | ICD-10-CM

## 2017-03-07 DIAGNOSIS — R4701 Aphasia: Secondary | ICD-10-CM | POA: Insufficient documentation

## 2017-03-07 DIAGNOSIS — R471 Dysarthria and anarthria: Secondary | ICD-10-CM | POA: Diagnosis present

## 2017-03-07 DIAGNOSIS — R29898 Other symptoms and signs involving the musculoskeletal system: Secondary | ICD-10-CM

## 2017-03-07 DIAGNOSIS — R41841 Cognitive communication deficit: Secondary | ICD-10-CM

## 2017-03-07 DIAGNOSIS — R41840 Attention and concentration deficit: Secondary | ICD-10-CM | POA: Diagnosis present

## 2017-03-07 DIAGNOSIS — R2689 Other abnormalities of gait and mobility: Secondary | ICD-10-CM

## 2017-03-07 DIAGNOSIS — R29818 Other symptoms and signs involving the nervous system: Secondary | ICD-10-CM | POA: Insufficient documentation

## 2017-03-07 DIAGNOSIS — R293 Abnormal posture: Secondary | ICD-10-CM

## 2017-03-07 DIAGNOSIS — R2681 Unsteadiness on feet: Secondary | ICD-10-CM | POA: Diagnosis present

## 2017-03-07 NOTE — Patient Instructions (Signed)
   10 loud and long "[id number] - ahhhhhhhh" Write down 10 sentences you say everyday and BRING THEM NEXT SESSION!

## 2017-03-07 NOTE — Therapy (Signed)
[facility] [address] [place], [place], [address] Phone: [phone]   Fax:  [phone]  Physical Therapy Evaluation  Patient Details  Name: [name] [name] MRN: [id number] Date of Birth: March 27, 1951 Referring Provider: Alonza Bogus, DO  Encounter Date: 03/07/2017      PT End of Session - 03/07/17 1038    Visit Number 1  New episode of care, eval 03/07/17   Number of Visits 17   Date for PT Re-Evaluation 05/06/17   Authorization Type [employer] Federal-75 visit limit combined   PT Start Time 0936   PT Stop Time 1021   PT Time Calculation (min) 45 min   Activity Tolerance Patient tolerated treatment well   Behavior During Therapy [facility] for tasks assessed/performed      Past Medical History:  Diagnosis Date  .  OSA (obstructive sleep apnea) 01/17/2011   npsg 2012:  AHI 67/hr. Auto titration 2012:  Optimal pressure 12cm.   . APPENDECTOMY, HX OF 02/18/2008   Qualifier: Diagnosis of  By: [place], [place]    . Cardiac murmur    as a child  . Depression   . Headache(784.0)   . HEADACHES, HX OF 02/18/2008   Qualifier: Diagnosis of  By: Danny Lawless CMA, [place]    . Parkinson disease ([place]) 11/2014  . Streptococcal meningitis    as an infant    Past Surgical History:  Procedure Laterality Date  . APPENDECTOMY  1967  . NASAL SINUS SURGERY     x 4 as a child  . VASECTOMY      There were no vitals filed for this visit.       Subjective Assessment - 03/07/17 0938    Subjective Pt has history of Parkinson's disease for several years.  Compared to discharge from PT, "everything is different".  He reports more frozen episodes, when "I'm in the middle of doing something."  Has had 2 falls in the past 6 months-one missed a step on the stairs, another fall, I skipped a step.  Pt feels like standing up is more difficult, often taking 4-5 tries to stand.  Pt describes a room spinning sensation upon standing after sitting long  periods.   Patient Stated Goals Pt's goals for therapy are to delay the symptoms of PD.   Currently in Pain? No/denies            [facility] PT Assessment - 03/07/17 0946      Assessment   Medical Diagnosis Parkinsons Disease   Referring Provider [name] Tat, DO     Precautions   Precautions Fall     Balance Screen   Has the patient fallen in the past 6 months Yes   How many times? 2   Has the patient had a decrease in activity level because of a fear of falling?  Yes   Is the patient reluctant to leave their home because of a fear of falling?  No     Home Ecologist residence   Living Arrangements Spouse/significant other   Available Help at Discharge Family   Type of [place] to enter   Entrance Stairs-Number of Steps 5   Entrance Stairs-Rails Right   Home Layout Two level;Bed/bath upstairs   Alternate Level Stairs-Number of Steps 12   Alternate Level Stairs-Rails Right   Additional Comments Pt reports doing down steps is more difficult     Prior Function   Level of  Independence Independent with basic ADLs;Independent with household mobility without device;Independent with community mobility without device   Vocation Retired   Leisure goes to [employer] 2 times a week     Observation/Other Assessments   Focus on Therapeutic Outcomes (FOTO)  NA     Posture/Postural Control   Posture/Postural Control Postural limitations   Postural Limitations Rounded Shoulders;Forward head     Tone   Assessment Location Right Lower Extremity;Left Lower Extremity     AROM   Overall AROM Comments Grossly tested 5/5 with MMT     Transfers   Transfers Sit to Stand;Stand to Sit   Sit to Stand 6: Modified independent (Device/Increase time);From chair/3-in-1;Without upper extremity assist   Five time sit to stand comments  11.43    Stand to Sit 6: Modified independent (Device/Increase time)   Comments Pt reports having to take 4-5 trials  to stand from surfaces at home     Ambulation/Gait   Ambulation/Gait Yes   Ambulation/Gait Assistance 6: Modified independent (Device/Increase time)   Ambulation Distance (Feet) 200 Feet   Assistive device None   Gait Pattern Step-through pattern;Decreased arm swing - right;Decreased arm swing - left;Decreased step length - right;Decreased step length - left;Decreased trunk rotation;Trunk flexed;Poor foot clearance - left;Poor foot clearance - right   Ambulation Surface Level;Indoor   Gait velocity 14.46 sec = 2.27 ft/sec   Stairs Yes   Stairs Assistance 6: Modified independent (Device/Increase time)   Stair Management Technique No rails;Two rails;Alternating pattern  No rails ascending, two rails descending   Number of Stairs 4   Height of Stairs 6     Standardized Balance Assessment   Standardized Balance Assessment Timed Up and Go Test     Timed Up and Go Test   TUG Normal TUG   Normal TUG (seconds) 16.34   Manual TUG (seconds) 17.75   Cognitive TUG (seconds) 20.09   TUG Comments Scores >13.5-15 seconds indicates increased fall risk.     High Level Balance   High Level Balance Comments MiniBESTest score 21/28 (decr from 24/28 at d/c in 12/17).  pt has difficulty with SLS, EC on foam, walk with head turns, with turning, dual tasks-see full report in note     RLE Tone   RLE Tone Mild     LLE Tone   LLE Tone Mild       Mini-BESTest: Balance Evaluation Systems Test  2005-2013 [employer]. All rights reserved. ________________________________________________________________________________________Anticipatory_________Subscore___4__/6 1. SIT TO STAND Instruction: "Cross your arms across your chest. Try not to use your hands unless you must.Do not let your legs lean against the back of the chair when you stand. Please stand up now." X(2) Normal: Comes to stand without use of hands and stabilizes independently. (1) Moderate: Comes to stand WITH use of  hands on first attempt. (0) Severe: Unable to stand up from chair without assistance, OR needs several attempts with use of hands. 2. RISE TO TOES Instruction: "Place your feet shoulder width apart. Place your hands on your hips. Try to rise as high as you can onto your toes. I will count out loud to 3 seconds. Try to hold this pose for at least 3 seconds. Look straight ahead. Rise now." X(2) Normal: Stable for 3 s with maximum height. (1) Moderate: Heels up, but not full range (smaller than when holding hands), OR noticeable instability for 3 s. (0) Severe: < 3 s. 3. STAND ON ONE LEG Instruction: "Look straight ahead. Keep your hands  on your hips. Lift your leg off of the ground behind you without touching or resting your raised leg upon your other standing leg. Stay standing on one leg as long as you can. Look straight ahead. Lift now." Left: Time in Seconds Trial 1:__0.5 sec___Trial 2:__0.78 sec___ (2) Normal: 20 s. (1) Moderate: < 20 s. X(0) Severe: Unable. Right: Time in Seconds Trial 1:___0.78 sec__Trial 2:__0.87 sec___ (2) Normal: 20 s. (1) Moderate: < 20 s. X(0) Severe: Unable To score each side separately use the trial with the longest time. To calculate the sub-score and total score use the side [left or right] with the lowest numerical score [i.e. the worse side]. ______________________________________________________________________________________Reactive Postural Control___________Subscore:___6__/6 4. COMPENSATORY STEPPING CORRECTION- FORWARD Instruction: "Stand with your feet shoulder width apart, arms at your sides. Lean forward against my hands beyond your forward limits. When I let go, do whatever is necessary, including taking a step, to avoid a fall." X(2) Normal: Recovers independently with a single, large step (second realignment step is allowed). (1) Moderate: More than one step used to recover equilibrium. (0) Severe: No step, OR would fall if not caught, OR falls  spontaneously. 5. COMPENSATORY STEPPING CORRECTION- BACKWARD Instruction: "Stand with your feet shoulder width apart, arms at your sides. Lean backward against my hands beyond your backward limits. When I let go, do whatever is necessary, including taking a step, to avoid a fall." X(2) Normal: Recovers independently with a single, large step. (1) Moderate: More than one step used to recover equilibrium. (0) Severe: No step, OR would fall if not caught, OR falls spontaneously. 6. COMPENSATORY STEPPING CORRECTION- LATERAL Instruction: "Stand with your feet together, arms down at your sides. Lean into my hand beyond your sideways limit. When I let go, do whatever is necessary, including taking a step, to avoid a fall." Left X(2) Normal: Recovers independently with 1 step (crossover or lateral OK). (1) Moderate: Several steps to recover equilibrium. (0) Severe: Falls, or cannot step. Right X(2) Normal: Recovers independently with 1 step (crossover or lateral OK). (1) Moderate: Several steps to recover equilibrium. (0) Severe: Falls, or cannot step. Use the side with the lowest score to calculate sub-score and total score. ____________________________________________________________________________________Sensory Orientation_____________Subscore:___5______/6 7. STANCE (FEET TOGETHER); EYES OPEN, FIRM SURFACE Instruction: "Place your hands on your hips. Place your feet together until almost touching. Look straight ahead. Be as stable and still as possible, until I say stop." Time in seconds:________ X(2) Normal: 30 s. (1) Moderate: < 30 s. (0) Severe: Unable. 8. STANCE (FEET TOGETHER); EYES CLOSED, FOAM SURFACE Instruction: "Step onto the foam. Place your hands on your hips. Place your feet together until almost touching. Be as stable and still as possible, until I say stop. I will start timing when you close your eyes." Time in seconds:___6.34 sec_____ (2) Normal: 30 s. X(1) Moderate: <  30 s. (0) Severe: Unable. 9. INCLINE- EYES CLOSED Instruction: "Step onto the incline ramp. Please stand on the incline ramp with your toes toward the top. Place your feet shoulder width apart and have your arms down at your sides. I will start timing when you close your eyes." Time in seconds:________ X(2) Normal: Stands independently 30 s and aligns with gravity. (1) Moderate: Stands independently <30 s OR aligns with surface. (0) Severe: Unable. _________________________________________________________________________________________Dynamic Gait ______Subscore____6____/10 10. CHANGE IN GAIT SPEED Instruction: "Begin walking at your normal speed, when I tell you 'fast', walk as fast as you can. When I say 'slow', walk very slowly." X(2) Normal: Significantly changes walking  speed without imbalance. (1) Moderate: Unable to change walking speed or signs of imbalance. (0) Severe: Unable to achieve significant change in walking speed AND signs of imbalance. [place] - HORIZONTAL Instruction: "Begin walking at your normal speed, when I say "right", turn your head and look to the right. When I say "left" turn your head and look to the left. Try to keep yourself walking in a straight line." (2) Normal: performs head turns with no change in gait speed and good balance. X(1) Moderate: performs head turns with reduction in gait speed. (0) Severe: performs head turns with imbalance. 12. WALK WITH PIVOT TURNS Instruction: "Begin walking at your normal speed. When I tell you to 'turn and stop', turn as quickly as you can, face the opposite direction, and stop. After the turn, your feet should be close together." (2) Normal: Turns with feet close FAST (< 3 steps) with good balance. X(1) Moderate: Turns with feet close SLOW (>4 steps) with good balance. (0) Severe: Cannot turn with feet close at any speed without imbalance. 13. STEP OVER OBSTACLES Instruction: "Begin walking at your  normal speed. When you get to the box, step over it, not around it and keep walking." (2) Normal: Able to step over box with minimal change of gait speed and with good balance. X(1) Moderate: Steps over box but touches box OR displays cautious behavior by slowing gait. (0) Severe: Unable to step over box OR steps around box. 14. TIMED UP & GO WITH DUAL TASK [3 METER WALK] Instruction TUG: "When I say 'Go', stand up from chair, walk at your normal speed across the tape on the floor, turn around, and come back to sit in the chair." Instruction TUG with Dual Task: "Count backwards by threes starting at ___. When I say 'Go', stand up from chair, walk at your normal speed across the tape on the floor, turn around, and come back to sit in the chair. Continue counting backwards the entire time." TUG: ________seconds; Dual Task TUG: ________seconds (2) Normal: No noticeable change in sitting, standing or walking while backward counting when compared to TUG without Dual Task. X(1) Moderate: Dual Task affects either counting OR walking (>10%) when compared to the TUG without Dual Task. (0) Severe: Stops counting while walking OR stops walking while counting. When scoring item 14, if subject's gait speed slows more than 10% between the TUG without and with a Dual Task the score should be decreased by a point. TOTAL SCORE: ____21___/28        Objective measurements completed on examination: See above findings.                    PT Short Term Goals - 03/07/17 1046      PT SHORT TERM GOAL #1   Title Pt will be independent with HEP to target Parkinson's specific deficits.  TARGET 04/05/17   Time 4   Period Weeks   Status New     PT SHORT TERM GOAL #2   Title Pt will improve TUG score to less than or equal to 13.5 seconds for decreased fall risk.   Time 4   Period Weeks   Status New     PT SHORT TERM GOAL #3   Title Pt will improve SLS to at least 3 seconds bilateral lower  extremities for improved stair negotiation and obstacle negotiation.   Time 4   Period Weeks   Status New     PT  SHORT TERM GOAL #4   Title Pt will perform 8 of 10 reps of sit<>stand transfers from 18" surfaces and below, with no UE support, no posterior lean, independently.   Time 4   Period Weeks   Status New     PT SHORT TERM GOAL #5   Title Pt will negotiate at least 4 steps, 6 reps, with/without rail, alternating pattern, modified independently for safety with stair negotiation.   Time 4   Period Weeks   Status New           PT Long Term Goals - 03/07/17 1049      PT LONG TERM GOAL #1   Title Pt will verbalize understanding of fall prevention/tips to reduce freezing with gait.  TARGET 05/05/17   Time 8   Period Weeks   Status New     PT LONG TERM GOAL #2   Title Pt will improve TUG cognitive to less than or equal to 15 seconds for decreased fall risk/improved dual tasking with gait.   Time 8   Period Weeks   Status New     PT LONG TERM GOAL #3   Title Pt will improve MiniBESTest to at least 24/28 for decreased fall risk.   Time 8   Period Weeks   Status New     PT LONG TERM GOAL #4   Title Pt will improve gait velocity to at least 2.62 ft/sec for improved gait efficiency and safety.   Time 8   Period Weeks   Status New     PT LONG TERM GOAL #5   Title Pt will verbalize plans for ongoing community fitness upon D/C from PT.   Time 8   Period Weeks   Status New                Plan - 03/07/17 1039    Clinical Impression Statement Pt is a 66 year old male with several year history of Parkinson's disease, who is known to this therapist.  He presents to OP PT for follow -up eval today after d/c from PT in 09/2016.  He presents with abnormal posture, rigidity, bradykinesia, decreased balance, decreased timing and coordination with gait, decreased functional strength with transfers, reports of freezing episodes.  Pt has slowed gait velocity, TUG Scores and  has lower MiniBEstest score compared to d/c in 09/2016.  Pt is at risk for falls per TUG score of 16.34 sec, MiniBESTest score of 21/28.  He would benefit from skilled PT to address the above stated deficits to improve functional mobility and decrease fall risk.   History and Personal Factors relevant to plan of care: 2 falls in past 6 months; PMH includes depression, OSA, syncope, hyperglycemia; new complaints of "room spinning" upon standing after prologed sitting   Clinical Presentation Evolving   Clinical Presentation due to: 2 falls in past 6 months, new c/o room spinning sensation   Clinical Decision Making Moderate   Rehab Potential Good   Clinical Impairments Affecting Rehab Potential  short term memory issues per speech eval   PT Frequency 2x / week   PT Duration 8 weeks  plus eval   PT Treatment/Interventions ADLs/Self Care Home Management;Functional mobility training;Stair training;Gait training;Patient/family education;Neuromuscular re-education;Balance training;Therapeutic exercise;Therapeutic activities   PT Next Visit Plan Assess vestibular versus orthostatic hypotension ("room spinning" sensation); initiate HEP-PWR! Moves, treadmill gait, stair training   Consulted and Agree with Plan of Care Patient      Patient will benefit from skilled therapeutic  intervention in order to improve the following deficits and impairments:  Abnormal gait, Decreased balance, Decreased mobility, Decreased strength, Difficulty walking, Impaired flexibility, Postural dysfunction, Impaired tone  Visit Diagnosis: Other abnormalities of gait and mobility  Unsteadiness on feet  Other symptoms and signs involving the nervous system  Abnormal posture      G-Codes - 03/10/17 1051    Functional Assessment Tool Used (Outpatient Only) 5x sit<>stand 11.43 sec, gait velocity 2.27 ft/sec, TUG 16.34 sec, TUG man 17.75 sec, TUG cognitive 20.09 sec, MiniBESTest score 21/28, 2 falls in past 6 months    Functional Limitation Mobility: Walking and moving around   Mobility: Walking and Moving Around Current Status [phone]) At least 40 percent but less than 60 percent impaired, limited or restricted   Mobility: Walking and Moving Around Goal Status [phone]) At least 20 percent but less than 40 percent impaired, limited or restricted       Problem List Patient Active Problem List   Diagnosis Date Noted  . Cerumen impaction 09/13/2016  . Chronic venous insufficiency 09/13/2016  . Fatigue 03/15/2016  . Parkinsonian features 09/15/2015  . ED (erectile dysfunction) 09/15/2015  . MDD (major depressive disorder), recurrent episode, severe ([place]) 02/10/2014  . Nonspecific abnormal electrocardiogram (ECG) (EKG) 02/07/2014  . Non-compliant behavior 02/03/2014  . Morton's neuroma of left foot 08/07/2013  . 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 Hyperglycemia 01/26/2012  . Hypogonadism male 01/24/2012  . Syncope and collapse 01/20/2012  .  OSA (obstructive sleep apnea) 01/17/2011  . CHEST TIGHTNESS 02/18/2008    Alper Guilmette W. 03-10-2017, 10:52 AM Frazier Butt., PT  [facility] [address] [place], [place], [address] Phone: [phone]   Fax:  [phone]  Name: [name] [name] MRN: [id number] Date of Birth: [birth year]

## 2017-03-07 NOTE — Therapy (Signed)
[facility] [address] [place], [place], [address] Phone: [phone]   Fax:  [phone]  Speech Language Pathology Evaluation  Patient Details  Name: [name] [name] MRN: [id number] Date of Birth: [birth year] Referring Provider: Alonza Bogus, DO  Encounter Date: 03/07/2017      End of Session - 03/07/17 1709    Visit Number 1   Number of Visits 17   Date for SLP Re-Evaluation 06/02/17   Authorization Type 75 total visits   Authorization - Visit Number 1   Authorization - Number of Visits 25   SLP Start Time 0805   SLP Stop Time  0846   SLP Time Calculation (min) 41 min   Activity Tolerance Patient tolerated treatment well      Past Medical History:  Diagnosis Date  .  OSA (obstructive sleep apnea) 01/17/2011   npsg 2012:  AHI 67/hr. Auto titration 2012:  Optimal pressure 12cm.   . APPENDECTOMY, HX OF 02/18/2008   Qualifier: Diagnosis of  By: [place], [place]    . Cardiac murmur    as a child  . Depression   . Headache(784.0)   . HEADACHES, HX OF 02/18/2008   Qualifier: Diagnosis of  By: Danny Lawless CMA, [place]    . Parkinson disease ([place]) 11/2014  . Streptococcal meningitis    as an infant    Past Surgical History:  Procedure Laterality Date  . APPENDECTOMY  1967  . NASAL SINUS SURGERY     x 4 as a child  . VASECTOMY      There were no vitals filed for this visit.      Subjective Assessment - 03/07/17 0809    Subjective "My son thinks my voice has gone down. He says I mumble more now."   Currently in Pain? Yes   Pain Score 1    Pain Location Head   Pain Orientation Mid   Pain Descriptors / Indicators Headache   Pain Type Acute pain   Pain Onset Today   Pain Frequency Intermittent   Aggravating Factors  nothing   Pain Relieving Factors nothing            SLP Evaluation [facility] - 03/07/17 0809      SLP Visit Information   SLP Received On 03/07/17   Referring Provider Tat, [name],  DO   Onset Date Diagnosed with PD since 2015   Medical Diagnosis Parkinson's Disease     General Information   HPI Pt well known to this SLP from previous courses of therapy.     Prior Functional Status   Cognitive/Linguistic Baseline Baseline deficits   Baseline deficit details Anomia    Lives With Spouse;Family   Vocation Retired     Associate Professor   Overall Cognitive Status History of cognitive impairments - at baseline   Attention Sustained  Goes out of the room and forgets what he left to get   Memory Impaired   Memory Impairment Decreased short term memory  decr'd working memory -pt forgot train of thought Government social research officer Comprehension   Overall Auditory Comprehension Appears within functional limits for tasks assessed     Verbal Expression   Overall Verbal Expression Impaired   Level of Generative/Spontaneous Verbalization Conversation  rare mild anomia today in simple-mod complex conversation     Oral Motor/Sensory Function   Overall Oral Motor/Sensory Function Impaired   Labial ROM Within Functional Limits   Labial Strength Reduced Right   Labial Coordination  Reduced   Lingual ROM Reduced right;Reduced left   Lingual Symmetry Abnormal symmetry right   Lingual Strength Reduced Left   Lingual Coordination Reduced     Motor Speech   Overall Motor Speech Impaired   Respiration Impaired   Level of Impairment Sentence   Phonation Low vocal intensity;Hoarse  intermittent hoarseness   Intelligibility Intelligible   Phonation Impaired   Volume Soft  average 70dB with focus on loudness      Measured when a sound level meter was placed 30 cm away from pt's mouth, 12 minutes of conversational speech was measured at appromimately 70dB (WNL= average 70-72dB) with range of 63-72dB. Overall speech intelligibility for this listener in a quiet environment was not affected, at approx 100%. Production of loud /a/ averaged 88dB (range of 80 to 87dB) and min verbal cues rarely  needed for loudness. Pt noted with anomia during this conversation, and once had complete thought omission during conversation.   When pt did not focus on loudness, loudness was reduced to average 68dB, which is lower than normal. Pt would benefit from skilled ST in order to improve speech intelligibility and cogntive-linguistics as they relate to pt's language skills. Pt reports he has decr'd QOL at this time due to these deficits.                       SLP Short Term Goals - 03/07/17 1712      SLP SHORT TERM GOAL #1   Title Pt will average loud /a/ of 88dB over 3 sessions   Time 4   Period Weeks   Status New     SLP SHORT TERM GOAL #2   Title Pt will average low-70s dB  in 10 minutes simple conversation with rare min A over 3 sessions   Time 4   Period Weeks   Status New     SLP SHORT TERM GOAL #3   Title pt will demo working Buyer, retail appropriate for recall of 80% of details   Time 4   Period Weeks   Status New     SLP SHORT TERM GOAL #4   Title pt will tell SLP three ways to compensate for verbal expression errors/events over two sessions   Time 4   Period Weeks   Status New          SLP Long Term Goals - 03/07/17 1714      SLP LONG TERM GOAL #1   Title pt to maintain average 70dB in 15 minutes mod complex conversation outside [place] room over three sessions   Time 8   Period Weeks   Status New     SLP LONG TERM GOAL #2   Title pt will demo aphasia compenastions functionally in 8 minutes mod complex conversation over two sessions   Time 8   Period Weeks   Status New     SLP LONG TERM GOAL #3   Title pt will demo working memory skills adequate for functional verbal expression in 15 minutes mod complex conversation over two sessions   Time 8   Period Weeks   Status New          Plan - 03/07/17 1710    Clinical Impression Statement Pt presents with reduced conversational loudness from dysarthria due to Parkinson's disease, as well as  pt-reported dysnomic errors, and SLP-observed (today) mild anomia and working memory deficits also believed due to Parkinson's disease which impede pt's overall ability to communicate  effectively. Pt would benefit from skilled ST addressing conversational loudness and other areas of language and cogntive-linguistics,.   Speech Therapy Frequency 2x / week   Duration --  8 weeks   Treatment/Interventions Language facilitation;Internal/external aids;Compensatory techniques;SLP instruction and feedback;Multimodal communcation approach;Cognitive reorganization;Functional tasks;Cueing hierarchy;Patient/family education   Potential to Achieve Goals Good   Potential Considerations Severity of impairments   Consulted and Agree with Plan of Care Patient      Patient will benefit from skilled therapeutic intervention in order to improve the following deficits and impairments:   Dysarthria and anarthria  Aphasia  Cognitive communication deficit    Problem List Patient Active Problem List   Diagnosis Date Noted  . Cerumen impaction 09/13/2016  . Chronic venous insufficiency 09/13/2016  . Fatigue 03/15/2016  . Parkinsonian features 09/15/2015  . ED (erectile dysfunction) 09/15/2015  . MDD (major depressive disorder), recurrent episode, severe ([place]) 02/10/2014  . Nonspecific abnormal electrocardiogram (ECG) (EKG) 02/07/2014  . Non-compliant behavior 02/03/2014  . Morton's neuroma of left foot 08/07/2013  . 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 Hyperglycemia 01/26/2012  . Hypogonadism male 01/24/2012  . Syncope and collapse 01/20/2012  .  OSA (obstructive sleep apnea) 01/17/2011  . CHEST TIGHTNESS 02/18/2008    Chip Canepa ,[place], CCC-SLP  03/07/2017, 5:16 PM  [facility] [address] [place], [place], [address] Phone: [phone]   Fax:  [phone]  Name: [name] [name] MRN:  [id number] Date of Birth: [birth year]

## 2017-03-07 NOTE — Therapy (Signed)
[facility] [address] [place], [place], [address] Phone: [phone]   Fax:  [phone]  Occupational Therapy Evaluation  Patient Details  Name: [name] [name] MRN: [id number] Date of Birth: 05/16/1951 Referring Provider: Dr. [name] Tat   Encounter Date: 03/07/2017      OT End of Session - 03/07/17 0832    Visit Number 1   Number of Visits 17   Date for OT Re-Evaluation 05/06/17   Authorization Type [employer]   Authorization Time Period 75 visit limit combined    OT Start Time 0845   OT Stop Time 0930   OT Time Calculation (min) 45 min   Activity Tolerance Patient tolerated treatment well   Behavior During Therapy [facility] for tasks assessed/performed      Past Medical History:  Diagnosis Date  .  OSA (obstructive sleep apnea) 01/17/2011   npsg 2012:  AHI 67/hr. Auto titration 2012:  Optimal pressure 12cm.   . APPENDECTOMY, HX OF 02/18/2008   Qualifier: Diagnosis of  By: [place], [place]    . Cardiac murmur    as a child  . Depression   . Headache(784.0)   . HEADACHES, HX OF 02/18/2008   Qualifier: Diagnosis of  By: Danny Lawless CMA, [place]    . Parkinson disease ([place]) 11/2014  . Streptococcal meningitis    as an infant    Past Surgical History:  Procedure Laterality Date  . APPENDECTOMY  1967  . NASAL SINUS SURGERY     x 4 as a child  . VASECTOMY      There were no vitals filed for this visit.      Subjective Assessment - 03/07/17 0836    Subjective  Pt reports more freezing and losing train of thought and is slower   Pertinent History Parkinson's disease, seeing illusions, hx of falls, hx of significant depression, mild cognitive impairment, sleep apnea, hypogonadism, ADD   Limitations ----   Currently in Pain? No/denies           [facility] OT Assessment - 03/07/17 0902      Assessment   Diagnosis Parkinson's disease   Referring Provider Dr. [name] Tat    Onset Date --  2015 diagnosis   Prior Therapy PT, OT, ST approx 6 months ago     Precautions   Precautions Fall     Balance Screen   Has the patient fallen in the past 6 months Yes   How many times? 2  tripped on objects     Juda expects to be discharged to: Private residence   Lives With Spouse     Prior Function   Level of Independence Independent with basic ADLs;Independent with household mobility without device;Independent with community mobility without device   Vocation Retired   Leisure goes to [employer] 2 times a week with trainer, stationary bike daily     ADL   Eating/Feeding Modified independent   Grooming Modified independent  incr time   Upper Body Bathing Modified independent  incr time   Lower Body Bathing Modified independent  incr time   Upper Body Dressing Increased time  mod I, struggles, particularly with jackets   Lower Body Dressing Increased time;Modified independent  struggles, difficulty bending forward to put on socks   Toilet Transfer Modified independent   Toileting - Clothing Manipulation Modified independent   Toileting -  Hygiene Modified Independent   Tub/Shower Transfer Independent  shower stall, no grab bars, no  seat   Transfers/Ambulation Related to ADL's Pt reports difficulty getting in/out of car and with sit>stand   ADL comments Pt reports difficulty carrying objects with ambulation and with difficulty opening/closing lids (bradykinesia noted).     IADL   Shopping Needs to be accompanied on any shopping trip  no longer drives   [place] on family or friends for transportation  no longer drives   Prior Level of Function Financial Management wife performs     Mobility   Mobility Status Freezing;History of falls   Mobility Status Comments Pt reports freezing in doorways and at other times     Written Expression   Dominant Hand Right   Handwriting 100% legible  mod micrographia--sentence level     Vision - History    Baseline Vision Bifocals   Additional Comments Pt reports blurriness with reading and occasional diplopia     Activity Tolerance   Activity Tolerance --  fatigues quickly     Cognition   Overall Cognitive Status Impaired/Different from baseline   Area of Impairment Attention;Memory;Awareness;Problem solving   Attention Comments demo difficulty with alternating and divided attnetion  loses train of thought   Memory Decreased short-term memory  also reports word finding difficulty   Memory Comments pt reports confusion   Problem Solving Slow processing  reports difficulty reading clocks   Bradyphrenia Yes   Behaviors --  confusion     Observation/Other Assessments   Observations forward lean, rounded shoulders   Standing Functional Reach Test RUE 10 in, LUE 9 in   Physical Performance Test   Yes   Simulated Eating Time (seconds) 14.90sec   Donning Doffing Jacket Time (seconds) 18.66sec   Donning Doffing Jacket Comments Fastening/unfastening 3 buttons in 61.60sec     Coordination   9 Hole Peg Test Right;Left   Right 9 Hole Peg Test 32.69   Left 9 Hole Peg Test 29.85   Box and Blocks RUE 46 blocks, LUE 46 blocks   Tremors bilateral resting and action tremors     Perception   Perception Impaired   Comments Pt reports hallucinations including movements in corner of eye and misinterpretation of visual info (ex: picture)     AROM   Overall AROM  Within functional limits for tasks performed   Overall AROM Comments --  mild decr supination     RUE Tone   RUE Tone Mild     LUE Tone   LUE Tone --  mild-mod                           OT Short Term Goals - 03/07/17 1742      OT SHORT TERM GOAL #1   Title Pt will be independent with PD specific  HEP.--check STGs 04/05/17   Time 4   Period Weeks   Status New     OT SHORT TERM GOAL #2   Title Pt will verbalize understanding of ways to prevent future complications and current available/appropriate  community resources prn.   Baseline -----   Time 4   Period Weeks   Status New     OT SHORT TERM GOAL #3   Title Pt will improve bilateral hand coordination as shown by fastening/unfastening 3 buttons in 50sec or less.   Baseline 61.60sec   Time 4   Period Weeks   Status New     OT SHORT TERM GOAL #4   Title Pt will report incr ease  with donning socks/shoes using adaptive strategies/AE prn.   Time 4   Period Weeks   Status New     OT SHORT TERM GOAL #5   Title Pt will demonstrate ability to write a 3 sentences with 100% legibility and only min decrease in letter size.   Baseline mod micrographia, 100% legibility   Time 4   Period Weeks   Status New           OT Long Term Goals - 03/07/17 1745      OT LONG TERM GOAL #1   Title Pt will verbalize understanding of adapted strategies for ADLs/IADLs--check LTGs 05/06/17   Time 8   Period Weeks   Status New     OT LONG TERM GOAL #2   Title Pt will demonstrate improved functional standing balance as evidenced by increasing LUE standing functional reach to 10 inches or greater   Baseline RUE 10 in, LUE 9 in   Time 8   Period Weeks   Status New     OT LONG TERM GOAL #3   Title Pt will improve bilateral hand coordination as shown by fastening/unfastening 3 buttons in 45sec or less.   Baseline 61.60sec   Time 8   Period Weeks   Status New     OT LONG TERM GOAL #4   Title Pt will be able to carry object in both hands when ambulating with direction changes and simple cognitive component without freezing for incr ease/safety with IADLs.   Baseline ----   Time 8   Period Weeks   Status New     OT LONG TERM GOAL #5   Title Pt will improve functional reaching/coordination for ADLs as shown by improving score on box and blocks test by at least 4 blocks bilaterally   Baseline 46 bilaterally   Time 8   Period Weeks   Status New     Long Term Additional Goals   Additional Long Term Goals Yes     OT LONG TERM GOAL #6    Title Pt will improve coordination of dominant R hand for ADLs as shown by improving time on 9-hole peg test by at least 4sec.   Baseline R-32.69sec, L-29.85sec   Time 8   Period Weeks   Status New               Plan - 03/07/17 1735    Clinical Impression Statement Pt presents today with bradykinesia, rigidity, decr functional mobility/balance for ADLs/IADLs, decr coordination, cognitive deficits, abnormal posture.  Pt would benefit from occupational therapy to address these deficits in order to prevent future complications and improve ADL/IADL performanc (incr ease and safety).    Occupational Profile and client history currently impacting functional performance Pt is a 66 y.o. male with Parkinson's disease with diagnosis of Parkinson's disease and PMH that includes:  seeing illusions, hx of falls, hx of significant depression, mild cognitive impairment, sleep apnea, hypogonadism, ADD.  Pt retired last year due to difficulty at work and no longer drives.  Pt reports slowing of ADLs and incr cognitive deficits impacting function.     Occupational performance deficits (Please refer to evaluation for details): ADL's;IADL's;Leisure;Social Participation   Rehab Potential Good   Current Impairments/barriers affecting progress: cognitive deficits   OT Frequency 2x / week   OT Duration 8 weeks  +evaluation   OT Treatment/Interventions Self-care/ADL training;Therapeutic exercise;Cognitive remediation/compensation;Visual/perceptual remediation/compensation;Neuromuscular education;[place];Moist Heat;Fluidtherapy;Energy conservation;Therapeutic exercises;Patient/family education;Balance training;Therapeutic activities;Passive range of motion;Manual Therapy;DME and/or AE instruction;Ultrasound;Cryotherapy;Functional Mobility Training  Plan PWR! moves in sitting, LB dressing techniques   Clinical Decision Making Several treatment options, min-mod task modification necessary   Recommended Other  Services receiving PT, ST   Consulted and Agree with Plan of Care Patient      Patient will benefit from skilled therapeutic intervention in order to improve the following deficits and impairments:  Decreased cognition, Impaired flexibility, Decreased mobility, Decreased coordination, Decreased endurance, Decreased range of motion, Decreased strength, Impaired UE functional use, Impaired tone, Impaired perceived functional ability, Decreased safety awareness, Difficulty walking, Decreased balance, Decreased activity tolerance, Impaired vision/preception, Improper spinal/pelvic alignment (bradykinesia)  Visit Diagnosis: Other symptoms and signs involving the nervous system  Other symptoms and signs involving the musculoskeletal system  Other lack of coordination  Other abnormalities of gait and mobility  Abnormal posture  Unsteadiness on feet  Attention and concentration deficit    Problem List Patient Active Problem List   Diagnosis Date Noted  . Cerumen impaction 09/13/2016  . Chronic venous insufficiency 09/13/2016  . Fatigue 03/15/2016  . Parkinsonian features 09/15/2015  . ED (erectile dysfunction) 09/15/2015  . MDD (major depressive disorder), recurrent episode, severe ([place]) 02/10/2014  . Nonspecific abnormal electrocardiogram (ECG) (EKG) 02/07/2014  . Non-compliant behavior 02/03/2014  . Morton's neuroma of left foot 08/07/2013  . 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 Hyperglycemia 01/26/2012  . Hypogonadism male 01/24/2012  . Syncope and collapse 01/20/2012  .  OSA (obstructive sleep apnea) 01/17/2011  . CHEST TIGHTNESS 02/18/2008    [facility] 03/07/2017, 5:51 PM  [facility] [address] [place], [place], [address] Phone: [phone]   Fax:  [phone]  Name: SHAMIR TUZZOLINO MRN: [id number] Date of Birth: January 08, 1951   Vianne Bulls, OTR/L [facility] [address] [place], [place]  [address] [phone] phone [phone] 03/07/17 5:51 PM

## 2017-03-10 ENCOUNTER — Ambulatory Visit (INDEPENDENT_AMBULATORY_CARE_PROVIDER_SITE_OTHER): Payer: Federal, State, Local not specified - PPO | Admitting: Psychiatry

## 2017-03-10 ENCOUNTER — Encounter (HOSPITAL_COMMUNITY): Payer: Self-pay | Admitting: Psychiatry

## 2017-03-10 DIAGNOSIS — F332 Major depressive disorder, recurrent severe without psychotic features: Secondary | ICD-10-CM

## 2017-03-10 DIAGNOSIS — Z811 Family history of alcohol abuse and dependence: Secondary | ICD-10-CM | POA: Diagnosis not present

## 2017-03-10 DIAGNOSIS — Z813 Family history of other psychoactive substance abuse and dependence: Secondary | ICD-10-CM | POA: Diagnosis not present

## 2017-03-10 MED ORDER — BUPROPION HCL ER (XL) 300 MG PO TB24: ORAL_TABLET | ORAL | 0 refills | Status: DC

## 2017-03-10 MED ORDER — CITALOPRAM HYDROBROMIDE 40 MG PO TABS: 40.0000 mg | ORAL_TABLET | Freq: Every day | ORAL | 0 refills | Status: DC

## 2017-03-10 NOTE — Progress Notes (Signed)
[name] Progress Note  03/10/2017 8:41 AM [name] [name]  MRN:  [id number]  Chief Complaint:  Chief Complaint    Follow-up     Subjective:   My mood is good.  But I'm anxious about my physical health.  I have more stiffness and tremors.  HPI: Patient came for his follow-up appointment with his wife.  He is taking his antidepressant and reported no side effects.  He described his mood is good and stable but he is concerned about his Parkinson.  Lately he has noticed she has more muscle stiffness, slow speech, tremors and memory impairment.  His wife does not let him drive.  He is going to neurocognitive rehabilitation for therapy.  Sometime he has delusions and hallucination but it does not bother him because he knows it is imagination.  He denies any crying spells, irritability, anger, mania or any psychosis.  He sleeping good and he notices increased appetite and gained weight.  He denies any suicidal thoughts or self abusive behavior.  His energy level is low.  He started going gym twice a week and he has a Physiological scientist through [employer].  Sometime he listen music or watch TV but I recommended to walk and do neurocognitive exercise .  Patient denies drinking alcohol or using any illegal substances.  He lives with his wife who is very supportive.  Patient had appointment to see neurologist on July ninth.  I recommended to discuss other options including levodopa and carbidopa since patient noticed his Parkinson is getting worse.    Visit Diagnosis:    ICD-10-CM   1. Major depressive disorder, recurrent severe without psychotic features ([place]) F33.2 citalopram (CELEXA) 40 MG tablet    buPROPion (WELLBUTRIN XL) 300 MG 24 hr tablet    Past Psychiatric History: Reviewed. Patient denies any history of suicidal attempt or any inpatient psychiatric treatment. He denies any history of mania, psychosis, paranoia or any hallucination. In the past he had tried Effexor, Abilify,  Paxil, Cymbalta, Zoloft, amitriptyline, Pristiq, Britnellex, Celexa, Deplin and Vyvanse. He is seen in this office since September 2009. He has seen multiple psychiatrists in the past.   Past Medical History:  Past Medical History:  Diagnosis Date  .  OSA (obstructive sleep apnea) 01/17/2011   npsg 2012:  AHI 67/hr. Auto titration 2012:  Optimal pressure 12cm.   . APPENDECTOMY, HX OF 02/18/2008   Qualifier: Diagnosis of  By: [place], [place]    . Cardiac murmur    as a child  . Depression   . Headache(784.0)   . HEADACHES, HX OF 02/18/2008   Qualifier: Diagnosis of  By: Danny Lawless CMA, [place]    . Parkinson disease ([place]) 11/2014  . Streptococcal meningitis    as an infant    Past Surgical History:  Procedure Laterality Date  . APPENDECTOMY  1967  . NASAL SINUS SURGERY     x 4 as a child  . VASECTOMY      Family Psychiatric History: Reviewed.  Family History:  Family History  Problem Relation Age of Onset  . Lung cancer Father   . Alcohol abuse Brother   . Drug abuse Brother   . Seizures Daughter     Social History:  Social History   Social History  . Marital status: Married    Spouse name: N/A  . Number of children: Y  . Years of education: N/A   Occupational History  . 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 Smoking status: Never Smoker  . Smokeless tobacco: Never Used  . Alcohol use No  . Drug use: No  . Sexual activity: Yes    Birth control/ protection: None   Other Topics Concern  . None   Social History Narrative  . None    Allergies: No Known Allergies  Metabolic Disorder Labs: Lab Results  Component Value Date   HGBA1C 5.9 09/13/2016   No results found for: PROLACTIN Lab Results  Component Value Date   CHOL 206 (H) 03/14/2016   TRIG 121.0 03/14/2016   HDL 53.90 03/14/2016   CHOLHDL 4 03/14/2016   VLDL 24.2 03/14/2016   LDLCALC 127 (H) 03/14/2016   LDLCALC 105 (H) 03/16/2015     Current  Medications: Current Outpatient Prescriptions  Medication Sig Dispense Refill  . B Complex-C (B-COMPLEX WITH VITAMIN C) tablet Take 1 tablet by mouth daily.    Marland Kitchen buPROPion (WELLBUTRIN XL) 300 MG 24 hr tablet TAKE 1 TABLET BY MOUTH EVERY DAY IN THE MORNING 90 tablet 0  . citalopram (CELEXA) 40 MG tablet Take 1 tablet (40 mg total) by mouth daily. 90 tablet 0  . fesoterodine (TOVIAZ) 8 MG TB24 tablet Take 8 mg by mouth daily.    . Omega 3-6-9 Fatty Acids (OMEGA 3-6-9 PO) Take by mouth.    Marland Kitchen rOPINIRole (REQUIP) 2 MG tablet TAKE 2 TABLETS IN THE AM, 1 IN THE AFTERNOON AND 1 IN THE EVENING 360 tablet 1  . TURMERIC CURCUMIN PO Take by mouth.     No current facility-administered medications for this visit.     Neurologic: Headache: No Seizure: No Paresthesias: No  Musculoskeletal: Strength & Muscle Tone: decreased Gait & Station: shuffle Patient leans: Front and Backward  Psychiatric Specialty Exam: Review of Systems  Constitutional: Negative.   HENT: Negative.   Musculoskeletal: Negative.   Skin: Negative for itching and rash.  Neurological: Positive for tremors.  Psychiatric/Behavioral: Positive for memory loss.    Blood pressure 124/74, pulse 67, weight 286 lb 6.4 oz (129.9 kg), SpO2 90 %.There is no height or weight on file to calculate BMI.  General Appearance: Casual and Pleasant  Eye Contact:  Good  Speech:  Slow  Volume:  Decreased  Mood:  Anxious  Affect:  Flat  Thought Process:  Descriptions of Associations: Intact  Orientation:  Full (Time, Place, and Person)  Thought Content: Rumination and At times but no paranoia or any disorganized thinking   Suicidal Thoughts:  No  Homicidal Thoughts:  No  Memory:  Immediate;   Fair Recent;   Fair Remote;   Fair  Judgement:  Good  Insight:  Good  Psychomotor Activity:  Decreased, Shuffling Gait and Tremor  Concentration:  Concentration: Fair and Attention Span: Fair  Recall:  [employer] of Knowledge: Good  Language: Good   Akathisia:  No  Handed:  Right  AIMS (if indicated):  0  Assets:  Communication Skills Desire for Improvement Housing Resilience Social Support  ADL's:  Intact  Cognition: Impaired,  Mild  Sleep:  Good     Assessment: Major depressive disorder, recurrent.  Plan: Reassurance given.  Patient is a stable on his current antidepressant.  He is not interested in counseling.  However he is concerned about slowly decline in his memory and having increase muscle stiffness and tremors.  Patient is scheduled to see neurologist in a few weeks and I'm recommended to discuss options to treat his Parkinson.  He does not want to change his antidepressant.  I will continue Celexa 40 mg daily and Wellbutrin XL 300 mg daily.  Discussed meditation side effects and benefits.  Encourage neurocognitive exercise.  Encouraged to continue exercise, walking.  Recommended to call us back if he has any question, concern or if he feel worsening of the symptom.  Follow-up in 3 months.  Kyung Muto T., MD 03/10/2017, 8:41 AM

## 2017-03-13 ENCOUNTER — Ambulatory Visit: Payer: Federal, State, Local not specified - PPO

## 2017-03-13 ENCOUNTER — Ambulatory Visit: Payer: Federal, State, Local not specified - PPO | Admitting: Occupational Therapy

## 2017-03-13 DIAGNOSIS — R2689 Other abnormalities of gait and mobility: Secondary | ICD-10-CM

## 2017-03-13 DIAGNOSIS — R29818 Other symptoms and signs involving the nervous system: Secondary | ICD-10-CM

## 2017-03-13 DIAGNOSIS — R29898 Other symptoms and signs involving the musculoskeletal system: Secondary | ICD-10-CM

## 2017-03-13 DIAGNOSIS — R4701 Aphasia: Secondary | ICD-10-CM

## 2017-03-13 DIAGNOSIS — R41841 Cognitive communication deficit: Secondary | ICD-10-CM

## 2017-03-13 DIAGNOSIS — R2681 Unsteadiness on feet: Secondary | ICD-10-CM

## 2017-03-13 DIAGNOSIS — R471 Dysarthria and anarthria: Secondary | ICD-10-CM

## 2017-03-13 DIAGNOSIS — R278 Other lack of coordination: Secondary | ICD-10-CM

## 2017-03-13 DIAGNOSIS — R293 Abnormal posture: Secondary | ICD-10-CM

## 2017-03-13 DIAGNOSIS — R41840 Attention and concentration deficit: Secondary | ICD-10-CM

## 2017-03-13 NOTE — Therapy (Signed)
[facility] [address] [place], [place], [address] Phone: [phone]   Fax:  [phone]  Speech Language Pathology Treatment  Patient Details  Name: [name] [name] MRN: [id number] Date of Birth: [birth year] Referring Provider: Alonza Bogus, DO  Encounter Date: 03/13/2017      End of Session - 03/13/17 1716    Visit Number 2   Number of Visits 17   Date for SLP Re-Evaluation 06/02/17   Authorization Type 75 total visits   Authorization - Visit Number 2   Authorization - Number of Visits 25   SLP Start Time 6962   SLP Stop Time  1447   SLP Time Calculation (min) 37 min   Activity Tolerance Patient tolerated treatment well      Past Medical History:  Diagnosis Date  .  OSA (obstructive sleep apnea) 01/17/2011   npsg 2012:  AHI 67/hr. Auto titration 2012:  Optimal pressure 12cm.   . APPENDECTOMY, HX OF 02/18/2008   Qualifier: Diagnosis of  By: [name], [name]    . Cardiac murmur    as a child  . Depression   . Headache(784.0)   . HEADACHES, HX OF 02/18/2008   Qualifier: Diagnosis of  By: [name] [name] CMA, [name]    . Parkinson disease ([place]) 11/2014  . Streptococcal meningitis    as an infant    Past Surgical History:  Procedure Laterality Date  . APPENDECTOMY  1967  . NASAL SINUS SURGERY     x 4 as a child  . VASECTOMY      There were no vitals filed for this visit.      Subjective Assessment - 03/13/17 1414    Subjective Pt entered with flat affect, monotone speech, hypomimic facial movement.   Currently in Pain? No/denies               ADULT SLP TREATMENT - 03/13/17 1416      General Information   Behavior/Cognition Alert;Cooperative;Pleasant mood     Treatment Provided   Treatment provided Cognitive-Linquistic     Cognitive-Linquistic Treatment   Treatment focused on Dysarthria   Skilled Treatment Loud /a/ used to recalibrate pt's conversational loudness to more WNL, with  long 1-2 second "hey" and immediately following a loud/long "ah". Average with this technique produced /a/ of average 89dB.  Conversational speech noted at mid 60s dB. Sentence responses req'd usual cues for maintaining incr'd loudness. SLP reviewed compensations for aphasia.     Assessment / Recommendations / Plan   Plan Continue with current plan of care     Progression Toward Goals   Progression toward goals Progressing toward goals          SLP Education - 03/13/17 1450    Education provided Yes   Education Details 10 everyday sentences, aphasia compensations   Person(s) Educated Patient   Methods Explanation   Comprehension Verbalized understanding          SLP Short Term Goals - 03/13/17 1726      SLP SHORT TERM GOAL #1   Title Pt will average loud /a/ of 88dB over 3 sessions   Time 4   Period Weeks   Status On-going     SLP SHORT TERM GOAL #2   Title Pt will average low-70s dB  in 10 minutes simple conversation with rare min A over 3 sessions   Time 4   Period Weeks   Status On-going     SLP SHORT TERM GOAL #3  Title pt will demo working memory skills appropriate for recall of 80% of details   Time 4   Period Weeks   Status On-going     SLP SHORT TERM GOAL #4   Title pt will tell SLP three ways to compensate for verbal expression errors/events over two sessions   Time 4   Period Weeks   Status On-going          SLP Long Term Goals - 03/13/17 1726      SLP LONG TERM GOAL #1   Title pt to maintain average 70dB in 15 minutes mod complex conversation outside [place] room over three sessions   Time 8   Period Weeks   Status On-going     SLP LONG TERM GOAL #2   Title pt will demo aphasia compenastions functionally in 8 minutes mod complex conversation over two sessions   Time 8   Period Weeks   Status On-going     SLP LONG TERM GOAL #3   Title pt will demo working memory skills adequate for functional verbal expression in 15 minutes mod complex  conversation over two sessions   Time 8   Period Weeks   Status On-going          Plan - 03/13/17 1716    Clinical Impression Statement Pt cont'd to present with reduced conversational loudness from dysarthria due to Parkinson's disease, which impedes pt's overall ability to communicate effectively . Pt would cont to benefit from skilled ST addressing conversational loudness and other areas of language and cogntive-linguistics,.   Speech Therapy Frequency 2x / week   Duration --  8 weeks   Treatment/Interventions Language facilitation;Internal/external aids;Compensatory techniques;SLP instruction and feedback;Multimodal communcation approach;Cognitive reorganization;Functional tasks;Cueing hierarchy;Patient/family education   Potential to Achieve Goals Good   Potential Considerations Severity of impairments   Consulted and Agree with Plan of Care Patient      Patient will benefit from skilled therapeutic intervention in order to improve the following deficits and impairments:   Dysarthria and anarthria  Cognitive communication deficit  Aphasia    Problem List Patient Active Problem List   Diagnosis Date Noted  . Cerumen impaction 09/13/2016  . Chronic venous insufficiency 09/13/2016  . Fatigue 03/15/2016  . Parkinsonian features 09/15/2015  . ED (erectile dysfunction) 09/15/2015  . MDD (major depressive disorder), recurrent episode, severe ([place]) 02/10/2014  . Nonspecific abnormal electrocardiogram (ECG) (EKG) 02/07/2014  . Non-compliant behavior 02/03/2014  . Morton's neuroma of left foot 08/07/2013  . 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 Hyperglycemia 01/26/2012  . Hypogonadism male 01/24/2012  . Syncope and collapse 01/20/2012  .  OSA (obstructive sleep apnea) 01/17/2011  . CHEST TIGHTNESS 02/18/2008    [name] [name] ,MS, CCC-SLP  03/13/2017, 5:27 PM  [facility] [address] [place], [place], [address] Phone: [phone]   Fax:  [phone]   Name: [name] [name] MRN: [id number] Date of Birth: March 09, 1951

## 2017-03-13 NOTE — Therapy (Addendum)
[facility] [address] [place], [place], [address] Phone: [phone]   Fax:  [phone]  Occupational Therapy Treatment  Patient Details  Name: [name] [name] MRN: [id number] Date of Birth: 1951-06-30 Referring Provider: Dr. [name] Tat   Encounter Date: 03/13/2017      OT End of Session - 03/13/17 1313    Visit Number 2   Number of Visits 17   Date for OT Re-Evaluation 05/06/17   Authorization Type [employer]   Authorization Time Period 75 visit limit combined    Authorization - Visit Number 1   Authorization - Number of Visits 25   OT Start Time 1318   OT Stop Time 1400   OT Time Calculation (min) 42 min   Activity Tolerance Patient tolerated treatment well   Behavior During Therapy [facility] for tasks assessed/performed      Past Medical History:  Diagnosis Date  .  OSA (obstructive sleep apnea) 01/17/2011   npsg 2012:  AHI 67/hr. Auto titration 2012:  Optimal pressure 12cm.   . APPENDECTOMY, HX OF 02/18/2008   Qualifier: Diagnosis of  By: [place], [place]    . Cardiac murmur    as a child  . Depression   . Headache(784.0)   . HEADACHES, HX OF 02/18/2008   Qualifier: Diagnosis of  By: Danny Lawless CMA, [place]    . Parkinson disease ([place]) 11/2014  . Streptococcal meningitis    as an infant    Past Surgical History:  Procedure Laterality Date  . APPENDECTOMY  1967  . NASAL SINUS SURGERY     x 4 as a child  . VASECTOMY      There were no vitals filed for this visit.      Subjective Assessment - 03/13/17 1312    Subjective  pt reports difficulty reaching to tie shoes   Pertinent History Parkinson's disease, seeing illusions, hx of falls, hx of significant depression, mild cognitive impairment, sleep apnea, hypogonadism, ADD   Limitations ----   Patient Stated Goals improved balance and ease with ADLs   Currently in Pain? No/denies          Neuro re-ed:    PWR! Moves (basic 4) in sitting x  15-20 each with min cues For incr movement amplitude.  Sitting with lifting LE and reaching to feet with alternating UE for incr core strength and improved flexibility for LB dressing with min cueing.  Then in sitting, stepping up on step with PWR! Hands with contralateral UE for coordination and incr large amplitude LE movements for LB dressing.  Pt needed min visual/verbal cues.  Then large amplitude movements for floor to ceiling BUE reach with min cueing.  In standing, functional step and reach to the side/diagonally with each UE/LE to flip large cards with focus on timing and coordination of UE/LE and large amplitude movements, mod v.c. Given, particularly to the L side .  In standing, rock and reach forward/back with each UE/LE with min cueing.                              OT Short Term Goals - 03/07/17 1742      OT SHORT TERM GOAL #1   Title Pt will be independent with PD specific  HEP.--check STGs 04/05/17   Time 4   Period Weeks   Status New     OT SHORT TERM GOAL #2   Title Pt will verbalize  understanding of ways to prevent future complications and current available/appropriate community resources prn.   Baseline -----   Time 4   Period Weeks   Status New     OT SHORT TERM GOAL #3   Title Pt will improve bilateral hand coordination as shown by fastening/unfastening 3 buttons in 50sec or less.   Baseline 61.60sec   Time 4   Period Weeks   Status New     OT SHORT TERM GOAL #4   Title Pt will report incr ease with donning socks/shoes using adaptive strategies/AE prn.   Time 4   Period Weeks   Status New     OT SHORT TERM GOAL #5   Title Pt will demonstrate ability to write a 3 sentences with 100% legibility and only min decrease in letter size.   Baseline mod micrographia, 100% legibility   Time 4   Period Weeks   Status New           OT Long Term Goals - 03/07/17 1745      OT LONG TERM GOAL #1   Title Pt will verbalize understanding  of adapted strategies for ADLs/IADLs--check LTGs 05/06/17   Time 8   Period Weeks   Status New     OT LONG TERM GOAL #2   Title Pt will demonstrate improved functional standing balance as evidenced by increasing LUE standing functional reach to 10 inches or greater   Baseline RUE 10 in, LUE 9 in   Time 8   Period Weeks   Status New     OT LONG TERM GOAL #3   Title Pt will improve bilateral hand coordination as shown by fastening/unfastening 3 buttons in 45sec or less.   Baseline 61.60sec   Time 8   Period Weeks   Status New     OT LONG TERM GOAL #4   Title Pt will be able to carry object in both hands when ambulating with direction changes and simple cognitive component without freezing for incr ease/safety with IADLs.   Baseline ----   Time 8   Period Weeks   Status New     OT LONG TERM GOAL #5   Title Pt will improve functional reaching/coordination for ADLs as shown by improving score on box and blocks test by at least 4 blocks bilaterally   Baseline 46 bilaterally   Time 8   Period Weeks   Status New     Long Term Additional Goals   Additional Long Term Goals Yes     OT LONG TERM GOAL #6   Title Pt will improve coordination of dominant R hand for ADLs as shown by improving time on 9-hole peg test by at least 4sec.   Baseline R-32.69sec, L-29.85sec   Time 8   Period Weeks   Status New               Plan - 03/13/17 1644    Clinical Impression Statement Pt demo improved ability to reach feet, improved gait, improved ability to lift feet at end of session.  Pt responds well to cues for large amplitude movement strategies.   Rehab Potential Good   Current Impairments/barriers affecting progress: cognitive deficits   OT Frequency 2x / week   OT Duration 8 weeks  +evaluation   OT Treatment/Interventions Self-care/ADL training;Therapeutic exercise;Cognitive remediation/compensation;Visual/perceptual remediation/compensation;Neuromuscular education;Parrafin;Moist  Heat;Fluidtherapy;Energy conservation;Therapeutic exercises;Patient/family education;Balance training;Therapeutic activities;Passive range of motion;Manual Therapy;DME and/or AE instruction;Ultrasound;Cryotherapy;Functional Mobility Training   Plan stretching in prep for LB dressing, dressing  techniques prn, large amplitude movement strategies   Consulted and Agree with Plan of Care Patient      Patient will benefit from skilled therapeutic intervention in order to improve the following deficits and impairments:  Decreased cognition, Impaired flexibility, Decreased mobility, Decreased coordination, Decreased endurance, Decreased range of motion, Decreased strength, Impaired UE functional use, Impaired tone, Impaired perceived functional ability, Decreased safety awareness, Difficulty walking, Decreased balance, Decreased activity tolerance, Impaired vision/preception, Improper spinal/pelvic alignment (bradykinesia)  Visit Diagnosis: Other symptoms and signs involving the nervous system  Other symptoms and signs involving the musculoskeletal system  Other lack of coordination  Other abnormalities of gait and mobility  Abnormal posture  Unsteadiness on feet  Attention and concentration deficit    Problem List Patient Active Problem List   Diagnosis Date Noted  . Cerumen impaction 09/13/2016  . Chronic venous insufficiency 09/13/2016  . Fatigue 03/15/2016  . Parkinsonian features 09/15/2015  . ED (erectile dysfunction) 09/15/2015  . MDD (major depressive disorder), recurrent episode, severe ([place]) 02/10/2014  . Nonspecific abnormal electrocardiogram (ECG) (EKG) 02/07/2014  . Non-compliant behavior 02/03/2014  . Morton's neuroma of left foot 08/07/2013  . 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 Hyperglycemia 01/26/2012  . Hypogonadism male 01/24/2012  . Syncope and collapse 01/20/2012  .  OSA (obstructive sleep apnea) 01/17/2011  . CHEST  TIGHTNESS 02/18/2008    [facility] 03/13/2017, 4:47 PM  [facility] [address] [place], [place], [address] Phone: [phone]   Fax:  [phone]  Name: [name] [name] MRN: [id number] Date of Birth: 31-Jan-1951   Vianne Bulls, OTR/L [facility] [address] [place],   [id number] [phone] phone [phone] 03/13/17 4:47 PM

## 2017-03-13 NOTE — Patient Instructions (Signed)
Say 10 sentences loudly, twice, when you do your loud "ah".

## 2017-03-14 ENCOUNTER — Ambulatory Visit: Payer: Self-pay | Admitting: Internal Medicine

## 2017-03-15 ENCOUNTER — Ambulatory Visit: Payer: Federal, State, Local not specified - PPO | Admitting: Occupational Therapy

## 2017-03-15 ENCOUNTER — Telehealth: Payer: Self-pay | Admitting: Neurology

## 2017-03-15 ENCOUNTER — Ambulatory Visit: Payer: Federal, State, Local not specified - PPO | Admitting: Speech Pathology

## 2017-03-15 ENCOUNTER — Ambulatory Visit: Payer: Federal, State, Local not specified - PPO | Admitting: Physical Therapy

## 2017-03-15 ENCOUNTER — Ambulatory Visit: Payer: Self-pay

## 2017-03-15 DIAGNOSIS — R2689 Other abnormalities of gait and mobility: Secondary | ICD-10-CM

## 2017-03-15 DIAGNOSIS — R29818 Other symptoms and signs involving the nervous system: Secondary | ICD-10-CM

## 2017-03-15 DIAGNOSIS — R293 Abnormal posture: Secondary | ICD-10-CM

## 2017-03-15 DIAGNOSIS — R4701 Aphasia: Secondary | ICD-10-CM

## 2017-03-15 DIAGNOSIS — R471 Dysarthria and anarthria: Secondary | ICD-10-CM

## 2017-03-15 DIAGNOSIS — R278 Other lack of coordination: Secondary | ICD-10-CM

## 2017-03-15 DIAGNOSIS — R29898 Other symptoms and signs involving the musculoskeletal system: Secondary | ICD-10-CM

## 2017-03-15 NOTE — Telephone Encounter (Signed)
Patient returning your call.  Thanks.

## 2017-03-15 NOTE — Therapy (Signed)
[facility] [address] [place], [place], [address] Phone: [phone]   Fax:  [phone]  Occupational Therapy Treatment  Patient Details  Name: [name] [name] MRN: [id number] Date of Birth: [birth year] Referring Provider: Dr. [name] Tat   Encounter Date: 03/15/2017      OT End of Session - 03/15/17 1258    Visit Number 3   Number of Visits 17   Date for OT Re-Evaluation 05/06/17   Authorization Type BCBS   Authorization Time Period 75 visit limit combined    Authorization - Visit Number 2   Authorization - Number of Visits 25   OT Start Time [id number]   OT Stop Time 0930   OT Time Calculation (min) 40 min   Activity Tolerance Patient tolerated treatment well   Behavior During Therapy [facility] for tasks assessed/performed      Past Medical History:  Diagnosis Date  .  OSA (obstructive sleep apnea) 01/17/2011   npsg 2012:  AHI 67/hr. Auto titration 2012:  Optimal pressure 12cm.   . APPENDECTOMY, HX OF 02/18/2008   Qualifier: Diagnosis of  By: [place], [place]    . Cardiac murmur    as a child  . Depression   . Headache(784.0)   . HEADACHES, HX OF 02/18/2008   Qualifier: Diagnosis of  By: Danny Lawless CMA, [place]    . Parkinson disease ([place]) 11/2014  . Streptococcal meningitis    as an infant    Past Surgical History:  Procedure Laterality Date  . APPENDECTOMY  1967  . NASAL SINUS SURGERY     x 4 as a child  . VASECTOMY      There were no vitals filed for this visit.      Subjective Assessment - 03/15/17 0907    Pertinent History Parkinson's disease, seeing illusions, hx of falls, hx of significant depression, mild cognitive impairment, sleep apnea, hypogonadism, ADD   Patient Stated Goals improved balance and ease with ADLs   Currently in Pain? No/denies                              OT Education - 03/15/17 1302    Education provided Yes   Education Details stretching prior to  dressing- see pt instructions,  donning/ doffing shoes and socks, PWR! moves basic 4  seated review   Person(s) Educated Patient   Methods Explanation;Demonstration;Verbal cues;Handout   Comprehension Verbalized understanding;Returned demonstration;Verbal cues required          OT Short Term Goals - 03/07/17 1742      OT SHORT TERM GOAL #1   Title Pt will be independent with PD specific  HEP.--check STGs 04/05/17   Time 4   Period Weeks   Status New     OT SHORT TERM GOAL #2   Title Pt will verbalize understanding of ways to prevent future complications and current available/appropriate community resources prn.   Baseline -----   Time 4   Period Weeks   Status New     OT SHORT TERM GOAL #3   Title Pt will improve bilateral hand coordination as shown by fastening/unfastening 3 buttons in 50sec or less.   Baseline 61.60sec   Time 4   Period Weeks   Status New     OT SHORT TERM GOAL #4   Title Pt will report incr ease with donning socks/shoes using adaptive strategies/AE prn.   Time 4   Period  Weeks   Status New     OT SHORT TERM GOAL #5   Title Pt will demonstrate ability to write a 3 sentences with 100% legibility and only min decrease in letter size.   Baseline mod micrographia, 100% legibility   Time 4   Period Weeks   Status New           OT Long Term Goals - 03/07/17 1745      OT LONG TERM GOAL #1   Title Pt will verbalize understanding of adapted strategies for ADLs/IADLs--check LTGs 05/06/17   Time 8   Period Weeks   Status New     OT LONG TERM GOAL #2   Title Pt will demonstrate improved functional standing balance as evidenced by increasing LUE standing functional reach to 10 inches or greater   Baseline RUE 10 in, LUE 9 in   Time 8   Period Weeks   Status New     OT LONG TERM GOAL #3   Title Pt will improve bilateral hand coordination as shown by fastening/unfastening 3 buttons in 45sec or less.   Baseline 61.60sec   Time 8   Period Weeks    Status New     OT LONG TERM GOAL #4   Title Pt will be able to carry object in both hands when ambulating with direction changes and simple cognitive component without freezing for incr ease/safety with IADLs.   Baseline ----   Time 8   Period Weeks   Status New     OT LONG TERM GOAL #5   Title Pt will improve functional reaching/coordination for ADLs as shown by improving score on box and blocks test by at least 4 blocks bilaterally   Baseline 46 bilaterally   Time 8   Period Weeks   Status New     Long Term Additional Goals   Additional Long Term Goals Yes     OT LONG TERM GOAL #6   Title Pt will improve coordination of dominant R hand for ADLs as shown by improving time on 9-hole peg test by at least 4sec.   Baseline R-32.69sec, L-29.85sec   Time 8   Period Weeks   Status New               Plan - 03/15/17 1259    Clinical Impression Statement Pt is progressing towards goals. He reports feeling better with improved ability to reach his legs for dressing after stretches.   Rehab Potential Good   Current Impairments/barriers affecting progress: cognitive deficits- Pt is having orthostatic BP, monitor PRN   OT Frequency 2x / week   OT Duration 8 weeks  +evaluation   OT Treatment/Interventions Self-care/ADL training;Therapeutic exercise;Cognitive remediation/compensation;Visual/perceptual remediation/compensation;Neuromuscular education;Parrafin;Moist Heat;Fluidtherapy;Energy conservation;Therapeutic exercises;Patient/family education;Balance training;Therapeutic activities;Passive range of motion;Manual Therapy;DME and/or AE instruction;Ultrasound;Cryotherapy;Functional Mobility Training   Plan check to see how stretches are going, large amplitude movements with functional activity/ dynamic reaching   OT Home Exercise Plan stretches prior to dressing   Consulted and Agree with Plan of Care Patient      Patient will benefit from skilled therapeutic intervention in order  to improve the following deficits and impairments:  Decreased cognition, Impaired flexibility, Decreased mobility, Decreased coordination, Decreased endurance, Decreased range of motion, Decreased strength, Impaired UE functional use, Impaired tone, Impaired perceived functional ability, Decreased safety awareness, Difficulty walking, Decreased balance, Decreased activity tolerance, Impaired vision/preception, Improper spinal/pelvic alignment (bradykinesia)  Visit Diagnosis: Other symptoms and signs involving the nervous system  Other symptoms  and signs involving the musculoskeletal system  Other lack of coordination  Other abnormalities of gait and mobility  Abnormal posture    Problem List Patient Active Problem List   Diagnosis Date Noted  . Cerumen impaction 09/13/2016  . Chronic venous insufficiency 09/13/2016  . Fatigue 03/15/2016  . Parkinsonian features 09/15/2015  . ED (erectile dysfunction) 09/15/2015  . MDD (major depressive disorder), recurrent episode, severe ([place]) 02/10/2014  . Nonspecific abnormal electrocardiogram (ECG) (EKG) 02/07/2014  . Non-compliant behavior 02/03/2014  . Morton's neuroma of left foot 08/07/2013  . 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 Hyperglycemia 01/26/2012  . Hypogonadism male 01/24/2012  . Syncope and collapse 01/20/2012  .  OSA (obstructive sleep apnea) 01/17/2011  . CHEST TIGHTNESS 02/18/2008    RINE,KATHRYN 03/15/2017, 1:04 PM  [facility] [address] [place], [place], [address] Phone: [phone]   Fax:  [phone]  Name: [name] [name] MRN: [id number] Date of Birth: 1951/08/15

## 2017-03-15 NOTE — Telephone Encounter (Signed)
Left message on machine for patient to call back.

## 2017-03-15 NOTE — Therapy (Signed)
[facility] [address] [place], [place], [address] Phone: [phone]   Fax:  [phone]  Physical Therapy Treatment  Patient Details  Name: [name] [name] MRN: [id number] Date of Birth: 1950/10/30 Referring Provider: Alonza Bogus, DO  Encounter Date: 03/15/2017      PT End of Session - 03/15/17 1228    Visit Number 2  New episode of care, eval 03/07/17   Number of Visits 17   Date for PT Re-Evaluation 05/06/17   Authorization Type BCBS [employer]-75 visit limit combined   PT Start Time 0806   PT Stop Time 0849   PT Time Calculation (min) 43 min   Activity Tolerance Patient tolerated treatment well   Behavior During Therapy [facility] for tasks assessed/performed      Past Medical History:  Diagnosis Date  .  OSA (obstructive sleep apnea) 01/17/2011   npsg 2012:  AHI 67/hr. Auto titration 2012:  Optimal pressure 12cm.   . APPENDECTOMY, HX OF 02/18/2008   Qualifier: Diagnosis of  By: [place], [place]    . Cardiac murmur    as a child  . Depression   . Headache(784.0)   . HEADACHES, HX OF 02/18/2008   Qualifier: Diagnosis of  By: Danny Lawless CMA, [place]    . Parkinson disease ([place]) 11/2014  . Streptococcal meningitis    as an infant    Past Surgical History:  Procedure Laterality Date  . APPENDECTOMY  1967  . NASAL SINUS SURGERY     x 4 as a child  . VASECTOMY      There were no vitals filed for this visit.      Subjective Assessment - 03/15/17 0808    Subjective Pt reports sensation of dizziness and tiredness upon standing from waiting room.  Says that happens sometimes when he sits for a long while.  There was a time earlier this week that I felt really bad-very weak and very stiff.   Patient Stated Goals Pt's goals for therapy are to delay the symptoms of PD.                Vestibular Assessment - 03/15/17 0812      Vestibular Assessment   General Observation Pt reports sensation of room  spinning, lightheaded after sitting for a while     Symptom Behavior   Type of Dizziness Lightheadedness  Also says room spinning upon standing   Frequency of Dizziness Occurs after sitting for long periods   Duration of Dizziness reports approximately one minute   Aggravating Factors Sit to stand  after sitting too long   Relieving Factors --  Just stands until it goes away     Orthostatics   BP supine (x 5 minutes) 127/76   HR supine (x 5 minutes) 67   BP sitting 105/73   HR sitting 72   BP standing (after 1 minute) 70/42   HR standing (after 1 minute) 68   BP standing (after 3 minutes) 122/67   HR standing (after 3 minutes) 69   Orthostatics Comment Pt reports sensation of lightheadedness upon standing, which lasted for approximately 45 seconds after standing.  Pt was did not c/o lightheadedness when BP was 70/42.        Discussed orthostatic blood pressure measures, and need for PT to f/u with Dr. Carles Collet with the measures.  Reiterated what Dr. Carles Collet has in her note regarding plenty of fluid intake and trying to incorporate seated leg exercises to aid in  circulation prior to standing.         [facility] Adult PT Treatment/Exercise - 03/15/17 0811      Self-Care   Self-Care Other Self-Care Comments   Other Self-Care Comments  Discussed changes in symptoms and recommended pt keep a journal of symptoms and possibly talk to Dr. Carles Collet about earlier appointment (to see her in July 2018)     Exercises   Exercises Knee/Hip;Ankle     Knee/Hip Exercises: Standing   Other Standing Knee Exercises Standing with lateral weigthshifting upon standing x 10-15 reps     Knee/Hip Exercises: Seated   Long Arc Quad AROM;Right;Left;10 reps   Marching AROM;Right;Left;10 reps     Ankle Exercises: Seated   Heel Raises 20 reps   Toe Raise 20 reps                PT Education - 03/15/17 1226    Education provided Yes   Education Details Orthostatic blood pressure measures, exercises in  sitting/initial standing as part of HEP; benefit to journal symptoms to discuss with physician   Person(s) Educated Patient   Methods Explanation;Demonstration;Handout   Comprehension Verbalized understanding;Returned demonstration;Verbal cues required;Need further instruction          PT Short Term Goals - 03/07/17 1046      PT SHORT TERM GOAL #1   Title Pt will be independent with HEP to target Parkinson's specific deficits.  TARGET 04/05/17   Time 4   Period Weeks   Status New     PT SHORT TERM GOAL #2   Title Pt will improve TUG score to less than or equal to 13.5 seconds for decreased fall risk.   Time 4   Period Weeks   Status New     PT SHORT TERM GOAL #3   Title Pt will improve SLS to at least 3 seconds bilateral lower extremities for improved stair negotiation and obstacle negotiation.   Time 4   Period Weeks   Status New     PT SHORT TERM GOAL #4   Title Pt will perform 8 of 10 reps of sit<>stand transfers from 18" surfaces and below, with no UE support, no posterior lean, independently.   Time 4   Period Weeks   Status New     PT SHORT TERM GOAL #5   Title Pt will negotiate at least 4 steps, 6 reps, with/without rail, alternating pattern, modified independently for safety with stair negotiation.   Time 4   Period Weeks   Status New           PT Long Term Goals - 03/07/17 1049      PT LONG TERM GOAL #1   Title Pt will verbalize understanding of fall prevention/tips to reduce freezing with gait.  TARGET 05/05/17   Time 8   Period Weeks   Status New     PT LONG TERM GOAL #2   Title Pt will improve TUG cognitive to less than or equal to 15 seconds for decreased fall risk/improved dual tasking with gait.   Time 8   Period Weeks   Status New     PT LONG TERM GOAL #3   Title Pt will improve MiniBESTest to at least 24/28 for decreased fall risk.   Time 8   Period Weeks   Status New     PT LONG TERM GOAL #4   Title Pt will improve gait velocity to at  least 2.62 ft/sec for improved gait efficiency and safety.  Time 8   Period Weeks   Status New     PT LONG TERM GOAL #5   Title Pt will verbalize plans for ongoing community fitness upon D/C from PT.   Time 8   Period Weeks   Status New               Plan - 03/15/17 1229    Clinical Impression Statement Focused skilled PT session today on f/u of patient's complaints of dizziness/room spinning sensation upon standing.  Performed orthostatic blood pressure measures today, with pt's diastolic BP decreaseing by >20 mm Hg from supine>sit and decreaseing by >20 mm Hg from sit>stand.  Educated patient in slowed movements/acclamating to each new position, seated and initial standing exercises after sitting for long periods.  Will continue to benefit from further skilled PT to improve funcitonal mobility and decrease fall risk.   Rehab Potential Good   Clinical Impairments Affecting Rehab Potential  short term memory issues per speech eval   PT Frequency 2x / week   PT Duration 8 weeks  plus eval   PT Treatment/Interventions ADLs/Self Care Home Management;Functional mobility training;Stair training;Gait training;Patient/family education;Neuromuscular re-education;Balance training;Therapeutic exercise;Therapeutic activities   PT Next Visit Plan PT to route note to Dr. Carles Collet with orthostatic BP measures; work on [employer]! Moves as part of HEP, treadmill, gait and stair training   Consulted and Agree with Plan of Care Patient      Patient will benefit from skilled therapeutic intervention in order to improve the following deficits and impairments:  Abnormal gait, Decreased balance, Decreased mobility, Decreased strength, Difficulty walking, Impaired flexibility, Postural dysfunction, Impaired tone  Visit Diagnosis: Other abnormalities of gait and mobility  Other symptoms and signs involving the nervous system     Problem List Patient Active Problem List   Diagnosis Date Noted  . Cerumen  impaction 09/13/2016  . Chronic venous insufficiency 09/13/2016  . Fatigue 03/15/2016  . Parkinsonian features 09/15/2015  . ED (erectile dysfunction) 09/15/2015  . MDD (major depressive disorder), recurrent episode, severe ([place]) 02/10/2014  . Nonspecific abnormal electrocardiogram (ECG) (EKG) 02/07/2014  . Non-compliant behavior 02/03/2014  . Morton's neuroma of left foot 08/07/2013  . 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 Hyperglycemia 01/26/2012  . Hypogonadism male 01/24/2012  . Syncope and collapse 01/20/2012  .  OSA (obstructive sleep apnea) 01/17/2011  . CHEST TIGHTNESS 02/18/2008    Markeeta Scalf W. 03/15/2017, 12:33 PM  Frazier Butt., PT   [facility] [address] [place], [place], [address] Phone: [phone]   Fax:  [phone]  Name: BARRET ESQUIVEL MRN: [id number] Date of Birth: 1951/02/07

## 2017-03-15 NOTE — Therapy (Signed)
[facility] [address] [place], [place], [address] Phone: [phone]   Fax:  [phone]  Speech Language Pathology Treatment  Patient Details  Name: [name] [name] MRN: [id number] Date of Birth: Jun 06, 1951 Referring Provider: Alonza Bogus, DO  Encounter Date: 03/15/2017      End of Session - 03/15/17 1221    Visit Number 3   Number of Visits 17   Date for SLP Re-Evaluation 06/02/17   Authorization Type 75 total visits   Authorization - Visit Number 3   Authorization - Number of Visits 25   SLP Start Time 0933   SLP Stop Time  1016   SLP Time Calculation (min) 43 min   Activity Tolerance Patient tolerated treatment well      Past Medical History:  Diagnosis Date  .  OSA (obstructive sleep apnea) 01/17/2011   npsg 2012:  AHI 67/hr. Auto titration 2012:  Optimal pressure 12cm.   . APPENDECTOMY, HX OF 02/18/2008   Qualifier: Diagnosis of  By: [place], [place]    . Cardiac murmur    as a child  . Depression   . Headache(784.0)   . HEADACHES, HX OF 02/18/2008   Qualifier: Diagnosis of  By: Danny Lawless CMA, [place]    . Parkinson disease ([place]) 11/2014  . Streptococcal meningitis    as an infant    Past Surgical History:  Procedure Laterality Date  . APPENDECTOMY  1967  . NASAL SINUS SURGERY     x 4 as a child  . VASECTOMY      There were no vitals filed for this visit.      Subjective Assessment - 03/15/17 0950    Subjective "I have issues where I say the wrong word like I said watermelon  for cantelope - it happens all the time and hear my self doing it"               ADULT SLP TREATMENT - 03/15/17 0953      General Information   Behavior/Cognition Alert;Cooperative;Pleasant mood     Treatment Provided   Treatment provided Cognitive-Linquistic     Cognitive-Linquistic Treatment   Treatment focused on Dysarthria   Skilled Treatment Loud /a/  to recalibrate loudness with "[facility]"  to facilitate volume - when pt achieved 89+dB, hoarseness reduced. Average 87dB  with occasional min A. Structured speech task with oral reading average 70dB, sentence responses to reading average of 67dB with ongoing cues for breathsupport and volume. and modeling. Simple conversation average of  66dB.      Assessment / Recommendations / Plan   Plan Continue with current plan of care     Progression Toward Goals   Progression toward goals Progressing toward goals          SLP Education - 03/15/17 1219    Education provided Yes   Education Details continue loud /a/, compensations for aphasia   Person(s) Educated Patient   Methods Explanation   Comprehension Verbalized understanding          SLP Short Term Goals - 03/15/17 1220      SLP SHORT TERM GOAL #1   Title Pt will average loud /a/ of 88dB over 3 sessions   Time 4   Period Weeks   Status On-going     SLP SHORT TERM GOAL #2   Title Pt will average low-70s dB  in 10 minutes simple conversation with rare min A over 3 sessions   Time 4  Period Weeks   Status On-going     SLP SHORT TERM GOAL #3   Title pt will demo working memory skills appropriate for recall of 80% of details   Time 4   Period Weeks   Status On-going     SLP SHORT TERM GOAL #4   Title pt will tell SLP three ways to compensate for verbal expression errors/events over two sessions   Time 4   Period Weeks   Status On-going          SLP Long Term Goals - 03/15/17 1221      SLP LONG TERM GOAL #1   Title pt to maintain average 70dB in 15 minutes mod complex conversation outside [place] room over three sessions   Time 8   Period Weeks   Status On-going     SLP LONG TERM GOAL #2   Title pt will demo aphasia compenastions functionally in 8 minutes mod complex conversation over two sessions   Time 8   Period Weeks   Status On-going     SLP LONG TERM GOAL #3   Title pt will demo working memory skills adequate for functional verbal expression in  15 minutes mod complex conversation over two sessions   Time 8   Period Weeks   Status On-going          Plan - 03/15/17 1220    Clinical Impression Statement Pt cont'd to present with reduced conversational loudness from dysarthria due to Parkinson's disease, which impedes pt's overall ability to communicate effectively . Pt would cont to benefit from skilled ST addressing conversational loudness and other areas of language and cogntive-linguistics,.   Speech Therapy Frequency 2x / week   Treatment/Interventions Language facilitation;Internal/external aids;Compensatory techniques;SLP instruction and feedback;Multimodal communcation approach;Cognitive reorganization;Functional tasks;Cueing hierarchy;Patient/family education   Potential to Achieve Goals Good   Potential Considerations Severity of impairments   Consulted and Agree with Plan of Care Patient      Patient will benefit from skilled therapeutic intervention in order to improve the following deficits and impairments:   Dysarthria and anarthria  Aphasia    Problem List Patient Active Problem List   Diagnosis Date Noted  . Cerumen impaction 09/13/2016  . Chronic venous insufficiency 09/13/2016  . Fatigue 03/15/2016  . Parkinsonian features 09/15/2015  . ED (erectile dysfunction) 09/15/2015  . MDD (major depressive disorder), recurrent episode, severe ([place]) 02/10/2014  . Nonspecific abnormal electrocardiogram (ECG) (EKG) 02/07/2014  . Non-compliant behavior 02/03/2014  . Morton's neuroma of left foot 08/07/2013  . 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 Hyperglycemia 01/26/2012  . Hypogonadism male 01/24/2012  . Syncope and collapse 01/20/2012  .  OSA (obstructive sleep apnea) 01/17/2011  . CHEST TIGHTNESS 02/18/2008    Anoop Hemmer, Annye Rusk MS, CCC-SLP 03/15/2017, 12:21 PM  [facility] [address] [place], [place],  [address] Phone: [phone]   Fax:  [phone]   Name: [name] [name] MRN: [id number] Date of Birth: Feb 03, 1951

## 2017-03-15 NOTE — Telephone Encounter (Signed)
Note from PT that : Please note pt's orthostatic blood pressure measures, which I took in response to pt's reports (at eval and today) about room spinning/lightheaded sensation upon standing after sitting long periods. Upon standing, he initially c/o dizziness, but he said it had subsided when his standing measure was 70/42. I wanted to make you aware. Thank you.   In past, patient had refused compression binder and stockings.  Would he reconsider the compression binder?  Also, is he drinking plenty of water?  I was a bit leary to add something to raise blood pressure given sitting blood pressure pretty good in past.  Have him stop by sometime for orthostatics (friday/monday might be good time)

## 2017-03-15 NOTE — Patient Instructions (Signed)
Stretches before dressing-  Before lower body dressing: While seated, Reach for the floor 3-5 x, do not hold your breath Seated, reach for each ankle 3x Cross one leg across your other knee hold 5 secs, perform 3x for each leg  Holding bag in both hands raise arms behind head as if putting on a shirt 10 reps  Holding a balled up plastic bag pass the bag behind your back between your hands 10x

## 2017-03-15 NOTE — Telephone Encounter (Signed)
Spoke with patient. Appt made for orthostatic BP check on Monday. He and his wife state his BP has been fine.

## 2017-03-15 NOTE — Patient Instructions (Addendum)
  Your blood pressure measures from this visit: Lying down:  127/76  Sitting 105/73 Standing after one minute:  70/42 Standing after 3 minutes:  122/67  Discussed:  -not holding breath with change of positions   -performing the exercises below in sitting prior to standing   -performing standing weightshifting after first standing prior to starting walking   -making sure to drink plenty of fluids during the day (as previously directed by Dr. Carles Collet)   -PT to contact Dr. Carles Collet with blood pressure measures from today  ANKLE: Pumps    In sitting, you will pump your ankles:  Point toes down, then up. _10-20_ reps per set   Copyright  VHI. All rights reserved.  Knee Raise    Lift knee and then lower it. Repeat with other knee. Repeat _10___ times.   http://gt2.exer.us/445   Copyright  VHI. All rights reserved.  KNEE: Extension, Long Arc Quads - Sitting    Raise leg until knee is straight. _10__ reps per set.  Copyright  VHI. All rights reserved.  Weight Shift: Lateral (Limits of Stability)    Slowly shift weight side to side through your hips, with your feet wide apart.  Repeat this 10-20 times before starting to walk, after you have been sitting.  Copyright  VHI. All rights reserved.

## 2017-03-19 ENCOUNTER — Encounter: Payer: Self-pay | Admitting: Neurology

## 2017-03-20 ENCOUNTER — Telehealth: Payer: Self-pay | Admitting: *Deleted

## 2017-03-20 ENCOUNTER — Ambulatory Visit (INDEPENDENT_AMBULATORY_CARE_PROVIDER_SITE_OTHER): Payer: Federal, State, Local not specified - PPO | Admitting: *Deleted

## 2017-03-20 ENCOUNTER — Ambulatory Visit (INDEPENDENT_AMBULATORY_CARE_PROVIDER_SITE_OTHER): Payer: Federal, State, Local not specified - PPO | Admitting: Family

## 2017-03-20 VITALS — BP 124/80 | HR 70 | Temp 98.9°F | Resp 16 | Ht 74.0 in | Wt 290.0 lb

## 2017-03-20 DIAGNOSIS — G20A1 Parkinson's disease without dyskinesia, without mention of fluctuations: Secondary | ICD-10-CM | POA: Insufficient documentation

## 2017-03-20 DIAGNOSIS — I951 Orthostatic hypotension: Secondary | ICD-10-CM

## 2017-03-20 DIAGNOSIS — R600 Localized edema: Secondary | ICD-10-CM | POA: Diagnosis not present

## 2017-03-20 DIAGNOSIS — G20 Parkinson's disease: Secondary | ICD-10-CM

## 2017-03-20 MED ORDER — ABDOMINAL BINDER/ELASTIC LARGE MISC: 1.0000 | Freq: Every day | 0 refills | Status: DC

## 2017-03-20 MED ORDER — FESOTERODINE FUMARATE ER 4 MG PO TB24: 4.0000 mg | ORAL_TABLET | Freq: Every day | ORAL | 0 refills | Status: DC

## 2017-03-20 MED ORDER — SILDENAFIL CITRATE 100 MG PO TABS: 100.0000 mg | ORAL_TABLET | Freq: Every day | ORAL | 0 refills | Status: DC | PRN

## 2017-03-20 NOTE — Patient Instructions (Addendum)
Thank you for choosing [facility].  SUMMARY AND INSTRUCTIONS:  Please continue to take your medications as prescribed.  Decrease your Toviaz to 4 mg daily.   Consider follow up with urology.   Continue to follow up with Dr. Carles Collet for your Parkinson symptoms.   Elevate legs when seated.   Reduce sodium intake.  Compression socks as needed.  Medication:  Your prescription(s) have been submitted to your pharmacy or been printed and provided for you. Please take as directed and contact our office if you believe you are having problem(s) with the medication(s) or have any questions.   Follow up:  If your symptoms worsen or fail to improve, please contact our office for further instruction, or in case of emergency go directly to the emergency room at the closest medical facility.

## 2017-03-20 NOTE — Telephone Encounter (Signed)
Pt not orthostatic today and would not want to add anything to raise.  Needs to do as previously instructed.  Was he willing to try compression binder as needed during day.  And increase water.

## 2017-03-20 NOTE — Assessment & Plan Note (Signed)
Parkinson's disease with worsening tremors and increased difficulty getting up from sitting including from the car. Does express fatigue and hypersolmulence with current medication regimen. Has noted increased in tremors as well. Questions increasing Requip, however had increased side effects with increasing dosage. Per Dr. Carles Collet the next step would be carba-levodopa. Continue current dosage of Requip and follow up and changes per Dr. Carles Collet.

## 2017-03-20 NOTE — Telephone Encounter (Signed)
Patient made aware. Willing to try binder. RX sent to patient per his request.

## 2017-03-20 NOTE — Addendum Note (Signed)
Addended byAnnamaria Helling on: 03/20/2017 01:23 PM   Modules accepted: Orders

## 2017-03-20 NOTE — Progress Notes (Signed)
Subjective:    Patient ID: [name] [name], male    DOB: 04-23-1951, 66 y.o.   MRN: [id number]  Chief Complaint  Patient presents with  . Follow-up    HPI:  [name] [name] is a 66 y.o. male who  has a past medical history of  OSA (obstructive sleep apnea) (01/17/2011); APPENDECTOMY, HX OF (02/18/2008); Cardiac murmur; Depression; Headache(784.0); HEADACHES, HX OF (02/18/2008); Parkinson disease ([place]) (11/2014); and Streptococcal meningitis. and presents today for a follow up office visit.  1.) Parkinson's disease - Currently maintained on ropinirole. Reports taking the medication as prescribed and notes that he has had increased fatigue and tiredness with hypersomnolence. Continues to work with neuro-rehabilitation. Has noted worsening of symptoms including tremor and getting up and moving. Having some difficulties with words at time. He is ambulating with the can at times to help with his balance.    2.) Bilateral lower extremity edema - This is a new problem. Associated symptom of bilateral lower extremity edema has been going on for a couple of months. No acute calf pain but does have chronic chest pain.   No Known Allergies    Outpatient Medications Prior to Visit  Medication Sig Dispense Refill  . B Complex-C (B-COMPLEX WITH VITAMIN C) tablet Take 1 tablet by mouth daily.    Marland Kitchen buPROPion (WELLBUTRIN XL) 300 MG 24 hr tablet TAKE 1 TABLET BY MOUTH EVERY DAY IN THE MORNING 90 tablet 0  . citalopram (CELEXA) 40 MG tablet Take 1 tablet (40 mg total) by mouth daily. 90 tablet 0  . Omega 3-6-9 Fatty Acids (OMEGA 3-6-9 PO) Take by mouth.    Marland Kitchen rOPINIRole (REQUIP) 2 MG tablet TAKE 2 TABLETS IN THE AM, 1 IN THE AFTERNOON AND 1 IN THE EVENING 360 tablet 1  . TURMERIC CURCUMIN PO Take by mouth.    . fesoterodine (TOVIAZ) 8 MG TB24 tablet Take 8 mg by mouth daily.     No facility-administered medications prior to visit.       Past Surgical History:  Procedure Laterality Date  .  APPENDECTOMY  1967  . NASAL SINUS SURGERY     x 4 as a child  . VASECTOMY        Past Medical History:  Diagnosis Date  .  OSA (obstructive sleep apnea) 01/17/2011   npsg 2012:  AHI 67/hr. Auto titration 2012:  Optimal pressure 12cm.   . APPENDECTOMY, HX OF 02/18/2008   Qualifier: Diagnosis of  By: [place], [place]    . Cardiac murmur    as a child  . Depression   . Headache(784.0)   . HEADACHES, HX OF 02/18/2008   Qualifier: Diagnosis of  By: Danny Lawless CMA, [place]    . Parkinson disease ([place]) 11/2014  . Streptococcal meningitis    as an infant      Review of Systems  Constitutional: Positive for fatigue. Negative for chills and fever.  Neurological: Positive for tremors and weakness. Negative for dizziness, facial asymmetry and headaches.  Psychiatric/Behavioral: Negative for dysphoric mood and sleep disturbance. The patient is not nervous/anxious.       Objective:    BP 124/80 (BP Location: Left Arm, Patient Position: Sitting, Cuff Size: Large)   Pulse 70   Temp 98.9 F (37.2 C) (Oral)   Resp 16   Ht 6\' 2"  (1.88 m)   Wt 290 lb (131.5 kg)   SpO2 94%   BMI 37.23 kg/m  Nursing note and vital signs reviewed.  Physical Exam  Constitutional: He is oriented to person, place, and time. He appears well-developed and well-nourished. No distress.  Cardiovascular: Normal rate, regular rhythm, normal heart sounds and intact distal pulses.   Mild/moderate bilateral lower extremity edema with pitting of 1+. No calf pain and negative Homans sign.  Pulmonary/Chest: Effort normal and breath sounds normal.  Neurological: He is alert and oriented to person, place, and time.  Skin: Skin is warm and dry.  Psychiatric: He has a normal mood and affect. His behavior is normal. Judgment and thought content normal.       Assessment & Plan:   Problem List Items Addressed This Visit      Nervous and Auditory   Parkinson's disease ([place])    Parkinson's disease with worsening tremors  and increased difficulty getting up from sitting including from the car. Does express fatigue and hypersolmulence with current medication regimen. Has noted increased in tremors as well. Questions increasing Requip, however had increased side effects with increasing dosage. Per Dr. Carles Collet the next step would be carba-levodopa. Continue current dosage of Requip and follow up and changes per Dr. Carles Collet.         Other   Bilateral lower extremity edema - Primary    Bilateral lower extremity edema in the setting of chronic venous insufficiency, obstructive sleep apnea and overactive bladder. A diuretic at this time would be counterintuitive given Toviaz 4 overactive bladder. Decreased Toviaz to 4 mg daily. Encouraged to avoid sodium, elevate legs associated, and compression socks as needed. Discussed importance of chronic symptom management to ensure adequate control and reduce risk for lower extremity edema. Follow-up if symptoms worsen or do not improve.          I have discontinued Mr. Brew fesoterodine. I am also having him start on fesoterodine and sildenafil. Additionally, I am having him maintain his B-complex with vitamin C, Omega 3-6-9 Fatty Acids (OMEGA 3-6-9 PO), TURMERIC CURCUMIN PO, rOPINIRole, citalopram, and buPROPion.   Meds ordered this encounter  Medications  . fesoterodine (TOVIAZ) 4 MG TB24 tablet    Sig: Take 1 tablet (4 mg total) by mouth daily.    Dispense:  30 tablet    Refill:  0    Order Specific Question:   Supervising Provider    Answer:   Pricilla Holm A [[id number]]  . sildenafil (VIAGRA) 100 MG tablet    Sig: Take 1 tablet (100 mg total) by mouth daily as needed for erectile dysfunction.    Dispense:  10 tablet    Refill:  0    Order Specific Question:   Supervising Provider    Answer:   Pricilla Holm A [[id number]]    A total of 25 minutes were spent face-to-face with the patient during this encounter and over 50%  of that time was spent on counseling and  coordination of care.  We discussed in depth the importance of taking medications as prescribed, etiology and treatments of lower extremity edema and possible next steps for his Parkinson's disease.    Follow-up: Return in about 1 month (around 04/19/2017), or if symptoms worsen or fail to improve.  Mauricio Po, FNP

## 2017-03-20 NOTE — Assessment & Plan Note (Signed)
Bilateral lower extremity edema in the setting of chronic venous insufficiency, obstructive sleep apnea and overactive bladder. A diuretic at this time would be counterintuitive given Toviaz 4 overactive bladder. Decreased Toviaz to 4 mg daily. Encouraged to avoid sodium, elevate legs associated, and compression socks as needed. Discussed importance of chronic symptom management to ensure adequate control and reduce risk for lower extremity edema. Follow-up if symptoms worsen or do not improve.

## 2017-03-20 NOTE — Telephone Encounter (Signed)
Patient came in for BP check. Lying: 120/80  80 Sitting: 114/78   60 Standing: 114/70  78  Results also in chart.

## 2017-03-21 NOTE — Progress Notes (Signed)
Entered in error

## 2017-03-31 ENCOUNTER — Ambulatory Visit: Payer: Federal, State, Local not specified - PPO | Admitting: Physical Therapy

## 2017-03-31 ENCOUNTER — Ambulatory Visit: Payer: Federal, State, Local not specified - PPO | Admitting: Occupational Therapy

## 2017-03-31 VITALS — BP 145/82

## 2017-03-31 DIAGNOSIS — R29818 Other symptoms and signs involving the nervous system: Secondary | ICD-10-CM

## 2017-03-31 DIAGNOSIS — R2681 Unsteadiness on feet: Secondary | ICD-10-CM

## 2017-03-31 DIAGNOSIS — R2689 Other abnormalities of gait and mobility: Secondary | ICD-10-CM | POA: Diagnosis not present

## 2017-03-31 DIAGNOSIS — R29898 Other symptoms and signs involving the musculoskeletal system: Secondary | ICD-10-CM

## 2017-03-31 DIAGNOSIS — R41840 Attention and concentration deficit: Secondary | ICD-10-CM

## 2017-03-31 DIAGNOSIS — R293 Abnormal posture: Secondary | ICD-10-CM

## 2017-03-31 DIAGNOSIS — R278 Other lack of coordination: Secondary | ICD-10-CM

## 2017-03-31 NOTE — Therapy (Signed)
[facility] [address] [place], [place], [address] Phone: [phone]   Fax:  [phone]  Occupational Therapy Treatment  Patient Details  Name: [name] [name] MRN: [id number] Date of Birth: 01-Apr-1951 Referring Provider: Dr. [name] Tat   Encounter Date: 03/31/2017      OT End of Session - 03/31/17 1616    Visit Number 4   Number of Visits 17   Date for OT Re-Evaluation 05/06/17   Authorization Type [employer]   Authorization Time Period 75 visit limit combined    Authorization - Visit Number 3   Authorization - Number of Visits 25   OT Start Time 0805   OT Stop Time 0845   OT Time Calculation (min) 40 min   Activity Tolerance Patient tolerated treatment well   Behavior During Therapy [facility] for tasks assessed/performed      Past Medical History:  Diagnosis Date  .  OSA (obstructive sleep apnea) 01/17/2011   npsg 2012:  AHI 67/hr. Auto titration 2012:  Optimal pressure 12cm.   . APPENDECTOMY, HX OF 02/18/2008   Qualifier: Diagnosis of  By: [place], [place]    . Cardiac murmur    as a child  . Depression   . Headache(784.0)   . HEADACHES, HX OF 02/18/2008   Qualifier: Diagnosis of  By: Danny Lawless CMA, [place]    . Parkinson disease ([place]) 11/2014  . Streptococcal meningitis    as an infant    Past Surgical History:  Procedure Laterality Date  . APPENDECTOMY  1967  . NASAL SINUS SURGERY     x 4 as a child  . VASECTOMY      Vitals:   03/31/17 0826  BP: (!) 145/82        Subjective Assessment - 03/31/17 0830    Subjective  Pt admits he forgot about the stretches   Pertinent History Parkinson's disease, seeing illusions, hx of falls, hx of significant depression, mild cognitive impairment, sleep apnea, hypogonadism, ADD   Limitations orthostatic BP!!! monitor PRN   Patient Stated Goals improved balance and ease with ADLs                    Arm bike x 5 mins level 1 for  conditioning          OT Education - 03/31/17 0831    Education provided Yes   Education Details stretching prior to dressing review, see pt instructions, re-issued, initiated handwriting strategies and practice   Person(s) Educated Patient   Methods Explanation;Demonstration;Verbal cues;Handout   Comprehension Verbalized understanding;Returned demonstration          OT Short Term Goals - 03/07/17 1742      OT SHORT TERM GOAL #1   Title Pt will be independent with PD specific  HEP.--check STGs 04/05/17   Time 4   Period Weeks   Status New     OT SHORT TERM GOAL #2   Title Pt will verbalize understanding of ways to prevent future complications and current available/appropriate community resources prn.   Baseline -----   Time 4   Period Weeks   Status New     OT SHORT TERM GOAL #3   Title Pt will improve bilateral hand coordination as shown by fastening/unfastening 3 buttons in 50sec or less.   Baseline 61.60sec   Time 4   Period Weeks   Status New     OT SHORT TERM GOAL #4   Title Pt will report incr  ease with donning socks/shoes using adaptive strategies/AE prn.   Time 4   Period Weeks   Status New     OT SHORT TERM GOAL #5   Title Pt will demonstrate ability to write a 3 sentences with 100% legibility and only min decrease in letter size.   Baseline mod micrographia, 100% legibility   Time 4   Period Weeks   Status New           OT Long Term Goals - 03/07/17 1745      OT LONG TERM GOAL #1   Title Pt will verbalize understanding of adapted strategies for ADLs/IADLs--check LTGs 05/06/17   Time 8   Period Weeks   Status New     OT LONG TERM GOAL #2   Title Pt will demonstrate improved functional standing balance as evidenced by increasing LUE standing functional reach to 10 inches or greater   Baseline RUE 10 in, LUE 9 in   Time 8   Period Weeks   Status New     OT LONG TERM GOAL #3   Title Pt will improve bilateral hand coordination as shown by  fastening/unfastening 3 buttons in 45sec or less.   Baseline 61.60sec   Time 8   Period Weeks   Status New     OT LONG TERM GOAL #4   Title Pt will be able to carry object in both hands when ambulating with direction changes and simple cognitive component without freezing for incr ease/safety with IADLs.   Baseline ----   Time 8   Period Weeks   Status New     OT LONG TERM GOAL #5   Title Pt will improve functional reaching/coordination for ADLs as shown by improving score on box and blocks test by at least 4 blocks bilaterally   Baseline 46 bilaterally   Time 8   Period Weeks   Status New     Long Term Additional Goals   Additional Long Term Goals Yes     OT LONG TERM GOAL #6   Title Pt will improve coordination of dominant R hand for ADLs as shown by improving time on 9-hole peg test by at least 4sec.   Baseline R-32.69sec, L-29.85sec   Time 8   Period Weeks   Status New               Plan - 03/31/17 1617    Clinical Impression Statement Pt is progressing towards goals. He demonstrates improved flexibility after performing stretches. Therapsit reinforced importance of pt performance prior to dressing.   Rehab Potential Good   Current Impairments/barriers affecting progress: cognitive deficits- Pt is having orthostatic BP, monitor PRN   OT Frequency 2x / week   OT Duration 8 weeks   OT Treatment/Interventions Self-care/ADL training;Therapeutic exercise;Cognitive remediation/compensation;Visual/perceptual remediation/compensation;Neuromuscular education;Parrafin;Moist Heat;Fluidtherapy;Energy conservation;Therapeutic exercises;Patient/family education;Balance training;Therapeutic activities;Passive range of motion;Manual Therapy;DME and/or AE instruction;Ultrasound;Cryotherapy;Functional Mobility Training   Plan large amplitude moe=vements with functional activity   OT Home Exercise Plan stretches prior to dressing   Consulted and Agree with Plan of Care Patient       Patient will benefit from skilled therapeutic intervention in order to improve the following deficits and impairments:  Decreased cognition, Impaired flexibility, Decreased mobility, Decreased coordination, Decreased endurance, Decreased range of motion, Decreased strength, Impaired UE functional use, Impaired tone, Impaired perceived functional ability, Decreased safety awareness, Difficulty walking, Decreased balance, Decreased activity tolerance, Impaired vision/preception, Improper spinal/pelvic alignment  Visit Diagnosis: Other symptoms and signs involving the nervous  system  Other symptoms and signs involving the musculoskeletal system  Other lack of coordination  Attention and concentration deficit    Problem List Patient Active Problem List   Diagnosis Date Noted  . Parkinson's disease ([place]) 03/20/2017  . Bilateral lower extremity edema 03/20/2017  . Cerumen impaction 09/13/2016  . Chronic venous insufficiency 09/13/2016  . Fatigue 03/15/2016  . Parkinsonian features 09/15/2015  . ED (erectile dysfunction) 09/15/2015  . MDD (major depressive disorder), recurrent episode, severe ([place]) 02/10/2014  . Nonspecific abnormal electrocardiogram (ECG) (EKG) 02/07/2014  . Non-compliant behavior 02/03/2014  . Morton's neuroma of left foot 08/07/2013  . 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 Hyperglycemia 01/26/2012  . Hypogonadism male 01/24/2012  . Syncope and collapse 01/20/2012  .  OSA (obstructive sleep apnea) 01/17/2011  . CHEST TIGHTNESS 02/18/2008    RINE,KATHRYN 03/31/2017, 4:19 PM  [facility] [address] [place], [place], [address] Phone: [phone]   Fax:  [phone]  Name: [name] [name] MRN: [id number] Date of Birth: Apr 16, 1951

## 2017-03-31 NOTE — Patient Instructions (Signed)
Stretches before dressing-  Before lower body dressing: While seated, Reach for the floor 3-5 x, do not hold your breath Seated, reach for each ankle 3x Cross one leg across your other knee hold 5 secs, perform 3x for each leg  Holding bag in both hands raise arms behind head as if putting on a shirt 10 reps  Holding a balled up plastic bag pass the bag behind your back between your hands 10x

## 2017-03-31 NOTE — Therapy (Signed)
[facility] [address] [place], [place], [address] Phone: [phone]   Fax:  [phone]  Physical Therapy Treatment  Patient Details  Name: [name] [name] MRN: [id number] Date of Birth: 1951-01-08 Referring Provider: Alonza Bogus, DO  Encounter Date: 03/31/2017      PT End of Session - 03/31/17 1029    Visit Number 3  New episode of care, eval 03/07/17   Number of Visits 17   Date for PT Re-Evaluation 05/06/17   Authorization Type [employer]-75 visit limit combined   PT Start Time 0848   PT Stop Time 0929   PT Time Calculation (min) 41 min   Equipment Utilized During Treatment Gait belt   Activity Tolerance Patient tolerated treatment well   Behavior During Therapy [facility] for tasks assessed/performed      Past Medical History:  Diagnosis Date  .  OSA (obstructive sleep apnea) 01/17/2011   npsg 2012:  AHI 67/hr. Auto titration 2012:  Optimal pressure 12cm.   . APPENDECTOMY, HX OF 02/18/2008   Qualifier: Diagnosis of  By: [place], [place]    . Cardiac murmur    as a child  . Depression   . Headache(784.0)   . HEADACHES, HX OF 02/18/2008   Qualifier: Diagnosis of  By: Danny Lawless CMA, [place]    . Parkinson disease ([place]) 11/2014  . Streptococcal meningitis    as an infant    Past Surgical History:  Procedure Laterality Date  . APPENDECTOMY  1967  . NASAL SINUS SURGERY     x 4 as a child  . VASECTOMY      There were no vitals filed for this visit.      Subjective Assessment - 03/31/17 0850    Subjective No changes since last visit.  Still going to the gym-twice a week.  No falls, no stumbles.  No further issues with blood pressure.   Patient Stated Goals Pt's goals for therapy are to delay the symptoms of PD.   Currently in Pain? No/denies                         [facility] Adult PT Treatment/Exercise - 03/31/17 0851      Transfers   Transfers Sit to Stand;Stand to Sit   Sit to  Stand 6: Modified independent (Device/Increase time);From bed;From chair/3-in-1;Without upper extremity assist   Stand to Sit 6: Modified independent (Device/Increase time);Without upper extremity assist;To bed;To chair/3-in-1   Number of Reps 10 reps;Other sets (comment)  10 reps each from 22", then 18" surfaces   Transfer Cueing Cues provided for upright posture (scapular retraction, quad and glut activation upon standing)     Ambulation/Gait   Ambulation/Gait Yes   Ambulation/Gait Assistance 6: Modified independent (Device/Increase time)   Ambulation Distance (Feet) 345 Feet   Assistive device None  used poles to facilitate arm swing   Gait Pattern Step-through pattern;Decreased arm swing - right;Decreased arm swing - left;Decreased step length - right;Decreased step length - left;Decreased trunk rotation;Trunk flexed;Poor foot clearance - left;Poor foot clearance - right  Foot clearance improves with frquent cues   Ambulation Surface Level;Unlevel   Pre-Gait Activities Turns and change of directions during treatment session for transition from one activity to another-pt tends to freeze, needing cues for widened BOS and weightshifting to initiate gait.   Gait Comments Treadmill gait activities x 2:50, at 0.9>1.2>1 mph with bilateral UE support, continued cues for upright posture and increased step length,  heelstrike, increased stance time.  Pt has increased forward lean and therefore, treadmill gait training trial ended.  Treadmill gait followed by on ground gait activities, with pt noted improved foot clearance and heelstrike.           PWR [facility]) - 03/31/17 7408    PWR! exercises Moves in standing   PWR! Up x 20   PWR! Rock x 20  (10 reps each side)   PWR! Twist x 20 (10 reps each side)   PWR Step x 10 (10 reps each side):  side step and weightshift, back step and weigthshfit, forward step and weightshift   Comments PT provides initial reminder cues for technique(as pt reports not  doing these at home anymore)     PWR! Up for posture, PWR! Rock for [employer], [place]! Twist for trunk rotation, PWR! Step for initiation of stepping        PT Education - 03/31/17 1028    Education provided Yes   Education Details HEP-added standing PWR! Moves   Person(s) Educated Patient   Methods Explanation;Demonstration;Handout   Comprehension Verbalized understanding;Returned demonstration;Verbal cues required          PT Short Term Goals - 03/07/17 1046      PT SHORT TERM GOAL #1   Title Pt will be independent with HEP to target Parkinson's specific deficits.  TARGET 04/05/17-Extend TARGET to 04/12/17-AWM   Time 4   Period Weeks   Status New     PT SHORT TERM GOAL #2   Title Pt will improve TUG score to less than or equal to 13.5 seconds for decreased fall risk.   Time 4   Period Weeks   Status New     PT SHORT TERM GOAL #3   Title Pt will improve SLS to at least 3 seconds bilateral lower extremities for improved stair negotiation and obstacle negotiation.   Time 4   Period Weeks   Status New     PT SHORT TERM GOAL #4   Title Pt will perform 8 of 10 reps of sit<>stand transfers from 18" surfaces and below, with no UE support, no posterior lean, independently.   Time 4   Period Weeks   Status New     PT SHORT TERM GOAL #5   Title Pt will negotiate at least 4 steps, 6 reps, with/without rail, alternating pattern, modified independently for safety with stair negotiation.   Time 4   Period Weeks   Status New           PT Long Term Goals - 03/07/17 1049      PT LONG TERM GOAL #1   Title Pt will verbalize understanding of fall prevention/tips to reduce freezing with gait.  TARGET 05/05/17   Time 8   Period Weeks   Status New     PT LONG TERM GOAL #2   Title Pt will improve TUG cognitive to less than or equal to 15 seconds for decreased fall risk/improved dual tasking with gait.   Time 8   Period Weeks   Status New     PT LONG TERM GOAL #3   Title  Pt will improve MiniBESTest to at least 24/28 for decreased fall risk.   Time 8   Period Weeks   Status New     PT LONG TERM GOAL #4   Title Pt will improve gait velocity to at least 2.62 ft/sec for improved gait efficiency and safety.   Time 8   Period Weeks  Status New     PT LONG TERM GOAL #5   Title Pt will verbalize plans for ongoing community fitness upon D/C from PT.   Time 8   Period Weeks   Status New               Plan - 03/31/17 1029    Clinical Impression Statement Skilled PT session focused on addition of standing PWR! Moves to HEP(pt has had these in the past, but is not currently doing).  Gait training initiated on treadmill to focus on step length and heelstrike, but pt relies heavily on UE support and has increased forward flexed posture.  However, after treadmill training, pt improved foot clearance and step length on over ground gait activities.  Pt has been seen only 2 visits since eval (due to scheduling conflicts); therefore, plan to extend STGs x 1-2 weeks before fully assessing.   Rehab Potential Good   Clinical Impairments Affecting Rehab Potential  short term memory issues per speech eval   PT Frequency 2x / week   PT Duration 8 weeks  plus eval   PT Treatment/Interventions ADLs/Self Care Home Management;Functional mobility training;Stair training;Gait training;Patient/family education;Neuromuscular re-education;Balance training;Therapeutic exercise;Therapeutic activities   PT Next Visit Plan Gait training on treadmill again with focus on upright posture, increased step length, then gait with walking poles to facilitate arm swing and step length; activities for SLS   Consulted and Agree with Plan of Care Patient      Patient will benefit from skilled therapeutic intervention in order to improve the following deficits and impairments:  Abnormal gait, Decreased balance, Decreased mobility, Decreased strength, Difficulty walking, Impaired flexibility,  Postural dysfunction, Impaired tone  Visit Diagnosis: Abnormal posture  Other abnormalities of gait and mobility  Unsteadiness on feet     Problem List Patient Active Problem List   Diagnosis Date Noted  . Parkinson's disease ([place]) 03/20/2017  . Bilateral lower extremity edema 03/20/2017  . Cerumen impaction 09/13/2016  . Chronic venous insufficiency 09/13/2016  . Fatigue 03/15/2016  . Parkinsonian features 09/15/2015  . ED (erectile dysfunction) 09/15/2015  . MDD (major depressive disorder), recurrent episode, severe ([place]) 02/10/2014  . Nonspecific abnormal electrocardiogram (ECG) (EKG) 02/07/2014  . Non-compliant behavior 02/03/2014  . Morton's neuroma of left foot 08/07/2013  . 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 Hyperglycemia 01/26/2012  . Hypogonadism male 01/24/2012  . Syncope and collapse 01/20/2012  .  OSA (obstructive sleep apnea) 01/17/2011  . CHEST TIGHTNESS 02/18/2008    Xavious Sharrar W. 03/31/2017, 10:37 AM Frazier Butt., PT  [facility] [address] [place], [place], [address] Phone: [phone]   Fax:  [phone]  Name: [name] [name] MRN: [id number] Date of Birth: 21-Feb-1951

## 2017-04-04 NOTE — Addendum Note (Signed)
Addended by: Garald Balding B on: 04/04/2017 05:24 PM   Modules accepted: Orders

## 2017-04-06 ENCOUNTER — Ambulatory Visit: Payer: Federal, State, Local not specified - PPO

## 2017-04-06 ENCOUNTER — Ambulatory Visit: Payer: Federal, State, Local not specified - PPO | Attending: Neurology | Admitting: Physical Therapy

## 2017-04-06 ENCOUNTER — Encounter: Payer: Self-pay | Admitting: Physical Therapy

## 2017-04-06 ENCOUNTER — Ambulatory Visit: Payer: Federal, State, Local not specified - PPO | Admitting: Occupational Therapy

## 2017-04-06 DIAGNOSIS — R29898 Other symptoms and signs involving the musculoskeletal system: Secondary | ICD-10-CM

## 2017-04-06 DIAGNOSIS — R293 Abnormal posture: Secondary | ICD-10-CM | POA: Diagnosis not present

## 2017-04-06 DIAGNOSIS — R41840 Attention and concentration deficit: Secondary | ICD-10-CM | POA: Insufficient documentation

## 2017-04-06 DIAGNOSIS — R471 Dysarthria and anarthria: Secondary | ICD-10-CM | POA: Insufficient documentation

## 2017-04-06 DIAGNOSIS — R41841 Cognitive communication deficit: Secondary | ICD-10-CM

## 2017-04-06 DIAGNOSIS — R278 Other lack of coordination: Secondary | ICD-10-CM | POA: Insufficient documentation

## 2017-04-06 DIAGNOSIS — R2689 Other abnormalities of gait and mobility: Secondary | ICD-10-CM | POA: Diagnosis present

## 2017-04-06 DIAGNOSIS — R2681 Unsteadiness on feet: Secondary | ICD-10-CM | POA: Diagnosis present

## 2017-04-06 DIAGNOSIS — R29818 Other symptoms and signs involving the nervous system: Secondary | ICD-10-CM | POA: Insufficient documentation

## 2017-04-06 DIAGNOSIS — R4701 Aphasia: Secondary | ICD-10-CM

## 2017-04-06 NOTE — Therapy (Signed)
[facility] [address] [place], [place], [address] Phone: [phone]   Fax:  [phone]  Speech Language Pathology Treatment  Patient Details  Name: [name] [name] MRN: [id number] Date of Birth: June 23, 1951 Referring Provider: Alonza Bogus, DO  Encounter Date: 04/06/2017      End of Session - 04/06/17 1638    Visit Number 5   Number of Visits 17   Date for SLP Re-Evaluation 06/02/17   Authorization Type 75 total visits   Authorization - Visit Number 5   Authorization - Number of Visits 25   SLP Start Time 3267   SLP Stop Time  1245   SLP Time Calculation (min) 41 min   Activity Tolerance Patient tolerated treatment well      Past Medical History:  Diagnosis Date  .  OSA (obstructive sleep apnea) 01/17/2011   npsg 2012:  AHI 67/hr. Auto titration 2012:  Optimal pressure 12cm.   . APPENDECTOMY, HX OF 02/18/2008   Qualifier: Diagnosis of  By: [place], [place]    . Cardiac murmur    as a child  . Depression   . Headache(784.0)   . HEADACHES, HX OF 02/18/2008   Qualifier: Diagnosis of  By: Danny Lawless CMA, [place]    . Parkinson disease ([place]) 11/2014  . Streptococcal meningitis    as an infant    Past Surgical History:  Procedure Laterality Date  . APPENDECTOMY  1967  . NASAL SINUS SURGERY     x 4 as a child  . VASECTOMY      There were no vitals filed for this visit.      Subjective Assessment - 04/06/17 1503    Subjective Pt states he thinks he is louder, generally, than prior to initiation of this  most current course of ST.    Currently in Pain? No/denies               ADULT SLP TREATMENT - 04/06/17 1505      General Information   Behavior/Cognition Alert;Cooperative;Pleasant mood     Treatment Provided   Treatment provided Cognitive-Linquistic     Cognitive-Linquistic Treatment   Treatment focused on Dysarthria   Skilled Treatment Loud /a/  to recalibrate loudness with  "[facility]" to facilitate volume - when pt achieved 90+dB, hoarseness reduced. Average 89dB with occasional min A for sustaining "heyyyy" prior to "ah". Sentence responses with average 70dB with ongoing cues for breath support and volume. Simple conversation average of mid-upper 60s dB, with mod A occasionally. SLP asked pt if he thought he would benefit from practice with facial extremes - happy, sad, mad, surprised. Pt did so and SLP told him to incorporate into his daily practice.      Assessment / Recommendations / Plan   Plan Continue with current plan of care     Progression Toward Goals   Progression toward goals Progressing toward goals          SLP Education - 04/06/17 1638    Education provided Yes   Education Details facial exercises/extremes   Person(s) Educated Patient   Methods Explanation;Demonstration;Verbal cues   Comprehension Verbalized understanding;Returned demonstration;Verbal cues required;Need further instruction          SLP Short Term Goals - 04/06/17 1639      SLP SHORT TERM GOAL #1   Title Pt will average loud /a/ of 88dB over 3 sessions   Time 3   Period Weeks   Status On-going  SLP SHORT TERM GOAL #2   Title Pt will average low-70s dB  in 10 minutes simple conversation with rare min A over 3 sessions   Time 3   Period Weeks   Status On-going     SLP SHORT TERM GOAL #3   Title pt will demo working memory skills appropriate for recall of 80% of details   Time 3   Period Weeks   Status On-going     SLP SHORT TERM GOAL #4   Title pt will tell SLP three ways to compensate for verbal expression errors/events over two sessions   Time 3   Period Weeks   Status On-going          SLP Long Term Goals - 04/06/17 1639      SLP LONG TERM GOAL #1   Title pt to maintain average 70dB in 15 minutes mod complex conversation outside [place] room over three sessions   Time 7   Period Weeks   Status On-going     SLP LONG TERM GOAL #2   Title pt  will demo aphasia compenastions functionally in 8 minutes mod complex conversation over two sessions   Time 7   Period Weeks   Status On-going     SLP LONG TERM GOAL #3   Title pt will demo working memory skills adequate for functional verbal expression in 15 minutes mod complex conversation over two sessions   Time 7   Period Weeks   Status On-going          Plan - 04/06/17 1638    Clinical Impression Statement Pt cont'd to present with reduced conversational loudness from dysarthria due to Parkinson's disease, which impedes pt's overall ability to communicate effectively . Pt would cont to benefit from skilled ST addressing conversational loudness and other areas of language and cogntive-linguistics,.   Speech Therapy Frequency 2x / week   Treatment/Interventions Language facilitation;Internal/external aids;Compensatory techniques;SLP instruction and feedback;Multimodal communcation approach;Cognitive reorganization;Functional tasks;Cueing hierarchy;Patient/family education   Potential to Achieve Goals Good   Potential Considerations Severity of impairments   Consulted and Agree with Plan of Care Patient      Patient will benefit from skilled therapeutic intervention in order to improve the following deficits and impairments:   Dysarthria and anarthria  Aphasia  Cognitive communication deficit    Problem List Patient Active Problem List   Diagnosis Date Noted  . Parkinson's disease ([place]) 03/20/2017  . Bilateral lower extremity edema 03/20/2017  . Cerumen impaction 09/13/2016  . Chronic venous insufficiency 09/13/2016  . Fatigue 03/15/2016  . Parkinsonian features 09/15/2015  . ED (erectile dysfunction) 09/15/2015  . MDD (major depressive disorder), recurrent episode, severe ([place]) 02/10/2014  . Nonspecific abnormal electrocardiogram (ECG) (EKG) 02/07/2014  . Non-compliant behavior 02/03/2014  . Morton's neuroma of left foot 08/07/2013  . Attention deficit disorder  without mention of hyperactivity 03/13/2012  . Hyperglycemia 01/26/2012  . Hypogonadism male 01/24/2012  . Syncope and collapse 01/20/2012  .  OSA (obstructive sleep apnea) 01/17/2011  . CHEST TIGHTNESS 02/18/2008    SCHINKE,CARL ,MS, CCC-SLP  04/06/2017, 4:40 PM  [facility] [address] [place], [place], [address] Phone: [phone]   Fax:  [phone]   Name: [name] [name] MRN: [id number] Date of Birth: March 22, 1951

## 2017-04-06 NOTE — Therapy (Signed)
[facility] [address] [place], [place], [address] Phone: [phone]   Fax:  [phone]  Occupational Therapy Treatment  Patient Details  Name: [name] [name] MRN: [id number] Date of Birth: 06-04-1951 Referring Provider: Dr. [name] Tat   Encounter Date: 04/06/2017      OT End of Session - 04/06/17 1427    Visit Number 5   Number of Visits 17   Date for OT Re-Evaluation 05/06/17   Authorization Type BCBS   Authorization Time Period 75 visit limit combined    Authorization - Visit Number 4   Authorization - Number of Visits 25   OT Start Time 6712   OT Stop Time 1445   OT Time Calculation (min) 42 min   Activity Tolerance Patient tolerated treatment well   Behavior During Therapy [facility] for tasks assessed/performed      Past Medical History:  Diagnosis Date  .  OSA (obstructive sleep apnea) 01/17/2011   npsg 2012:  AHI 67/hr. Auto titration 2012:  Optimal pressure 12cm.   . APPENDECTOMY, HX OF 02/18/2008   Qualifier: Diagnosis of  By: [place], [place]    . Cardiac murmur    as a child  . Depression   . Headache(784.0)   . HEADACHES, HX OF 02/18/2008   Qualifier: Diagnosis of  By: Danny Lawless CMA, [place]    . Parkinson disease ([place]) 11/2014  . Streptococcal meningitis    as an infant    Past Surgical History:  Procedure Laterality Date  . APPENDECTOMY  1967  . NASAL SINUS SURGERY     x 4 as a child  . VASECTOMY      There were no vitals filed for this visit.      Subjective Assessment - 04/06/17 1406    Subjective  Pt reports that he is starting [employer] next week;  Pt is going to attend PD symposium.  "I am afraid that Dr. Carles Collet is going to put me on the next medication"   Pertinent History Parkinson's disease, seeing illusions, hx of falls, hx of significant depression, mild cognitive impairment, sleep apnea, hypogonadism, ADD   Limitations orthostatic BP!!! monitor PRN   Patient Stated Goals  improved balance and ease with ADLs   Currently in Pain? No/denies       Arm bike x76min level 1 for reciprocal movement with cues/target of at least 40-50rpms for intensity while maintaining movement amplitude/reciprocal movement (forward/backwards).   Pt maintained 36 (backwards)-52rpms.  Pt educated that timing of morning meds may improve ability to dress along with stretching.  (pt reports that he moves better in the afternoon).  Emphasized importance of optimal exercise and that community exercise programs are not going to address functional movements the way that PWR! Moves/therapy HEP will and that it is important to continue with therapy HEP for this in addition to community fitness.  Pt verbalized understanding.  PWR! Hands (basic 4)  x 10 each with min cues For incr movement amplitude.  Practiced buttoning/unbuttoning shirt on table top with min cues for use of PWR! Hands prior to buttoning and use of deliberate/large amplitude movements after instruction.  Pt demo improvement with repetition and use of large amplitude movements.                                OT Education - 04/06/17 1425    Education Details Considerations for [employer] (to prevent  future complications--good posture, large amplitude movements, trunk movements); PD Symposium   Person(s) Educated Patient   Methods Explanation   Comprehension Verbalized understanding          OT Short Term Goals - 03/07/17 1742      OT SHORT TERM GOAL #1   Title Pt will be independent with PD specific  HEP.--check STGs 04/05/17   Time 4   Period Weeks   Status New     OT SHORT TERM GOAL #2   Title Pt will verbalize understanding of ways to prevent future complications and current available/appropriate community resources prn.   Baseline -----   Time 4   Period Weeks   Status New     OT SHORT TERM GOAL #3   Title Pt will improve bilateral hand coordination as shown by  fastening/unfastening 3 buttons in 50sec or less.   Baseline 61.60sec   Time 4   Period Weeks   Status New     OT SHORT TERM GOAL #4   Title Pt will report incr ease with donning socks/shoes using adaptive strategies/AE prn.   Time 4   Period Weeks   Status New     OT SHORT TERM GOAL #5   Title Pt will demonstrate ability to write a 3 sentences with 100% legibility and only min decrease in letter size.   Baseline mod micrographia, 100% legibility   Time 4   Period Weeks   Status New           OT Long Term Goals - 03/07/17 1745      OT LONG TERM GOAL #1   Title Pt will verbalize understanding of adapted strategies for ADLs/IADLs--check LTGs 05/06/17   Time 8   Period Weeks   Status New     OT LONG TERM GOAL #2   Title Pt will demonstrate improved functional standing balance as evidenced by increasing LUE standing functional reach to 10 inches or greater   Baseline RUE 10 in, LUE 9 in   Time 8   Period Weeks   Status New     OT LONG TERM GOAL #3   Title Pt will improve bilateral hand coordination as shown by fastening/unfastening 3 buttons in 45sec or less.   Baseline 61.60sec   Time 8   Period Weeks   Status New     OT LONG TERM GOAL #4   Title Pt will be able to carry object in both hands when ambulating with direction changes and simple cognitive component without freezing for incr ease/safety with IADLs.   Baseline ----   Time 8   Period Weeks   Status New     OT LONG TERM GOAL #5   Title Pt will improve functional reaching/coordination for ADLs as shown by improving score on box and blocks test by at least 4 blocks bilaterally   Baseline 46 bilaterally   Time 8   Period Weeks   Status New     Long Term Additional Goals   Additional Long Term Goals Yes     OT LONG TERM GOAL #6   Title Pt will improve coordination of dominant R hand for ADLs as shown by improving time on 9-hole peg test by at least 4sec.   Baseline R-32.69sec, L-29.85sec   Time 8    Period Weeks   Status New             Patient will benefit from skilled therapeutic intervention in order to improve the following  deficits and impairments:     Visit Diagnosis: Other symptoms and signs involving the nervous system  Other symptoms and signs involving the musculoskeletal system  Other lack of coordination  Attention and concentration deficit  Abnormal posture  Other abnormalities of gait and mobility  Unsteadiness on feet    Problem List Patient Active Problem List   Diagnosis Date Noted  . Parkinson's disease ([place]) 03/20/2017  . Bilateral lower extremity edema 03/20/2017  . Cerumen impaction 09/13/2016  . Chronic venous insufficiency 09/13/2016  . Fatigue 03/15/2016  . Parkinsonian features 09/15/2015  . ED (erectile dysfunction) 09/15/2015  . MDD (major depressive disorder), recurrent episode, severe ([place]) 02/10/2014  . Nonspecific abnormal electrocardiogram (ECG) (EKG) 02/07/2014  . Non-compliant behavior 02/03/2014  . Morton's neuroma of left foot 08/07/2013  . 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 Hyperglycemia 01/26/2012  . Hypogonadism male 01/24/2012  . Syncope and collapse 01/20/2012  .  OSA (obstructive sleep apnea) 01/17/2011  . CHEST TIGHTNESS 02/18/2008    [facility] 04/06/2017, 2:28 PM  [facility] [address] [place], [place], [address] Phone: [phone]   Fax:  [phone]  Name: [name] [name] MRN: [id number] Date of Birth: 08-Jan-1951   Vianne Bulls, OTR/L [facility] [address] [place], [place]  [address] [phone] phone [phone] 04/06/17 2:44 PM

## 2017-04-06 NOTE — Therapy (Signed)
[facility] [address] [place], [place], [address] Phone: [phone]   Fax:  [phone]  Physical Therapy Treatment  Patient Details  Name: [name] [name] MRN: [id number] Date of Birth: 03/27/1951 Referring Provider: Alonza Bogus, DO  Encounter Date: 04/06/2017      PT End of Session - 04/06/17 1642    Visit Number 4  New episode of care, eval 03/07/17   Number of Visits 17   Date for PT Re-Evaluation 05/06/17   Authorization Type [employer]-75 visit limit combined   PT Start Time 1535   PT Stop Time 1616   PT Time Calculation (min) 41 min   Activity Tolerance Patient tolerated treatment well   Behavior During Therapy [facility] for tasks assessed/performed      Past Medical History:  Diagnosis Date  .  OSA (obstructive sleep apnea) 01/17/2011   npsg 2012:  AHI 67/hr. Auto titration 2012:  Optimal pressure 12cm.   . APPENDECTOMY, HX OF 02/18/2008   Qualifier: Diagnosis of  By: [place], [place]    . Cardiac murmur    as a child  . Depression   . Headache(784.0)   . HEADACHES, HX OF 02/18/2008   Qualifier: Diagnosis of  By: Danny Lawless CMA, [place]    . Parkinson disease ([place]) 11/2014  . Streptococcal meningitis    as an infant    Past Surgical History:  Procedure Laterality Date  . APPENDECTOMY  1967  . NASAL SINUS SURGERY     x 4 as a child  . VASECTOMY      There were no vitals filed for this visit.      Subjective Assessment - 04/06/17 1630    Subjective States he walks on the treadmill for 30 minutes at the gym. Denies falls or stumbling since last visit.   Patient Stated Goals Pt's goals for therapy are to delay the symptoms of PD.   Currently in Pain? No/denies                         [facility] Adult PT Treatment/Exercise - 04/06/17 1631      Bed Mobility   Bed Mobility Right Sidelying to Sit;Rolling Right;Rolling Left;Sit to Sidelying Right   Rolling Right 6: Modified  independent (Device/Increase time)   Rolling Left 6: Modified independent (Device/Increase time)   Right Sidelying to Sit 6: Modified independent (Device/Increase time)   Sit to Sidelying Right 6: Modified independent (Device/Increase time)     Transfers   Transfers Sit to Stand;Stand to Sit   Sit to Stand 6: Modified independent (Device/Increase time)   Stand to Sit 6: Modified independent (Device/Increase time)     Ambulation/Gait   Ambulation/Gait Assistance 6: Modified independent (Device/Increase time)   Ambulation Distance (Feet) 240 Feet  +treadmill   Assistive device None   Gait Pattern Step-through pattern;Decreased arm swing - right;Decreased arm swing - left;Decreased step length - right;Decreased step length - left;Decreased trunk rotation;Trunk flexed;Poor foot clearance - left;Poor foot clearance - right   Ambulation Surface Level     Posture/Postural Control   Posture/Postural Control Postural limitations   Postural Limitations Rounded Shoulders;Forward head   Posture Comments while supine, stretched with arms out to sides to stretch across upper chest/shoulders     Exercises   Exercises Lumbar     Lumbar Exercises: Stretches   Passive Hamstring Stretch 1 rep;60 seconds   Passive Hamstring Stretch Limitations supine   Lower Trunk Rotation 2  reps;20 seconds   Hip Flexor Stretch 1 rep;20 seconds  sidelying     Lumbar Exercises: Aerobic   Tread Mill 10 min; 0.8-1.2 mph (pt with better upright posture as speed of treadmill increased; slower speeds he had incr forward lean on arms with feet trailing behind; frequent verbal cues for step length and heelstrike; tactile cues for upright posture and hip extension     Lumbar Exercises: Standing   Heel Raises Limitations bil x 10 3 sec hold; unilateral unable to raise either heel           [facility]) - 04/06/17 1639    P[place]! [place] x 20  at counter with targets to reach for   Comments reports he forgets to work on these  at home          Balance Exercises - 04/06/17 1640      Balance Exercises: Standing   SLS Eyes open;Solid surface;Upper extremity support 2;3 reps  each leg 10-30 seconds; vc for light use of UEs             PT Short Term Goals - 03/07/17 1046      PT SHORT TERM GOAL #1   Title Pt will be independent with HEP to target Parkinson's specific deficits.  TARGET 04/05/17   Time 4   Period Weeks   Status New     PT SHORT TERM GOAL #2   Title Pt will improve TUG score to less than or equal to 13.5 seconds for decreased fall risk.   Time 4   Period Weeks   Status New     PT SHORT TERM GOAL #3   Title Pt will improve SLS to at least 3 seconds bilateral lower extremities for improved stair negotiation and obstacle negotiation.   Time 4   Period Weeks   Status New     PT SHORT TERM GOAL #4   Title Pt will perform 8 of 10 reps of sit<>stand transfers from 18" surfaces and below, with no UE support, no posterior lean, independently.   Time 4   Period Weeks   Status New     PT SHORT TERM GOAL #5   Title Pt will negotiate at least 4 steps, 6 reps, with/without rail, alternating pattern, modified independently for safety with stair negotiation.   Time 4   Period Weeks   Status New           PT Long Term Goals - 03/07/17 1049      PT LONG TERM GOAL #1   Title Pt will verbalize understanding of fall prevention/tips to reduce freezing with gait.  TARGET 05/05/17   Time 8   Period Weeks   Status New     PT LONG TERM GOAL #2   Title Pt will improve TUG cognitive to less than or equal to 15 seconds for decreased fall risk/improved dual tasking with gait.   Time 8   Period Weeks   Status New     PT LONG TERM GOAL #3   Title Pt will improve MiniBESTest to at least 24/28 for decreased fall risk.   Time 8   Period Weeks   Status New     PT LONG TERM GOAL #4   Title Pt will improve gait velocity to at least 2.62 ft/sec for improved gait efficiency and safety.   Time 8    Period Weeks   Status New     PT LONG TERM GOAL #5   Title  Pt will verbalize plans for ongoing community fitness upon D/C from PT.   Time 8   Period Weeks   Status New               Plan - 04/06/17 1643    Clinical Impression Statement Session focused on gait training, balance,  and stretches to improve larger movements. Patient did better on treadmill with slightly faster pace and was able to complete 10 minutes which allowed PT to provide tactile cues/faciliation for upright posture and hip extension. Patient will continue to benefit from PT to work towards [place] Potential Good   Clinical Impairments Affecting Rehab Potential  short term memory issues per speech eval   PT Frequency 2x / week   PT Duration 8 weeks  plus eval   PT Treatment/Interventions ADLs/Self Care Home Management;Functional mobility training;Stair training;Gait training;Patient/family education;Neuromuscular re-education;Balance training;Therapeutic exercise;Therapeutic activities   PT Next Visit Plan Gait training on treadmill again with focus on upright posture, increased step length, then gait with walking poles to facilitate arm swing and step length; activities for SLS   Consulted and Agree with Plan of Care Patient      Patient will benefit from skilled therapeutic intervention in order to improve the following deficits and impairments:  Abnormal gait, Decreased balance, Decreased mobility, Decreased strength, Difficulty walking, Impaired flexibility, Postural dysfunction, Impaired tone  Visit Diagnosis: Abnormal posture  Other abnormalities of gait and mobility  Unsteadiness on feet     Problem List Patient Active Problem List   Diagnosis Date Noted  . Parkinson's disease ([place]) 03/20/2017  . Bilateral lower extremity edema 03/20/2017  . Cerumen impaction 09/13/2016  . Chronic venous insufficiency 09/13/2016  . Fatigue 03/15/2016  . Parkinsonian features 09/15/2015  . ED  (erectile dysfunction) 09/15/2015  . MDD (major depressive disorder), recurrent episode, severe ([place]) 02/10/2014  . Nonspecific abnormal electrocardiogram (ECG) (EKG) 02/07/2014  . Non-compliant behavior 02/03/2014  . Morton's neuroma of left foot 08/07/2013  . 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 Hyperglycemia 01/26/2012  . Hypogonadism male 01/24/2012  . Syncope and collapse 01/20/2012  .  OSA (obstructive sleep apnea) 01/17/2011  . CHEST TIGHTNESS 02/18/2008    Rexanne Mano, PT 04/06/2017, 4:48 PM  [facility] [address] [place], [place], [address] Phone: [phone]   Fax:  [phone]  Name: THELTON GRACA MRN: [id number] Date of Birth: 17-Oct-1950

## 2017-04-06 NOTE — Patient Instructions (Signed)
  Please complete the assigned speech therapy homework prior to your next session and return it to the speech therapist at your next visit.  

## 2017-04-06 NOTE — Progress Notes (Signed)
[name] [name] was seen today in the movement disorders clinic for neurologic consultation at the request of [name], MD.  The consultation is for the evaluation of slowness, flat affect and to r/o a neurologic disorder.  This patient is accompanied in the office by his spouse who supplements the history.  The first symptom(s) the patient noticed was a feeling of weakness that has been going on for about a year.  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  For about a year and a half he has noted a "body" tremor and his wife has noted a hand tremor in both hands (at rest).  He is stiff like he is "a 66 year old man."  Once he is in bed he finds it is hard to move.  The patient has a hx of significant depression and is on multiple medications related to this.  He was started on cogentin just yesterday, but is also on celexa, wellbutrin and latuda.  He has been on [place] for 1.5 months.  Prior to that he was on abilify (tried on 2 separate times but most recently was only on it for a day) and geodon(doesn't remember this but looks like it may have been given in the past).  He has been on risperdal but cannot remember how long ago.  ECT was offered but his wife does not want him to proceed with that  11/20/14 update:  The patient returns today for follow-up.  He is accompanied by his wife who supplements the history.  He has been off of [place] for about 2 weeks.  Unfortunately, he was unable to afford the dat scan.  He is on just Wellbutrin and Celexa for depression and his Cogentin and has been discontinued.  He has noted a decreased appetite and dry mouth and having trouble sleeping because of stiffness.  Pt states that he is almost afraid to go to sleep.  Missing 2 days of work per week (works nights) but a lot of that because of anxiety.  His wife notices increased tremor.  He has slow at work.  Admits to some  lightheadedness but no syncope.  Note diplopia.  No hallucinations.  His wife is doing most of the driving because he is having difficulty staying in his own lane.  02/19/15 update:  The patient is following up today regarding his parkinsonism.  I reviewed records from his psychiatrist since last visit.  His psychiatry note from 12/04/14 indicates that the patient stated that tremor was better.  However, I received a call 4 days later stating that tremor was worse and he thought it was from the Mirapex.  He wanted to change the Mirapex to something else.  He was on Mirapex 0.5 mg 3 times per day at the time.  We ended up switching it to Requip 1 mg 3 times a day.  Fortunately he is doing better.  He denies any side effects with the Requip.  He currently takes it at 8 AM/2 PM/8 PM.  He does state that he notices that he "shuffles with the left leg."  He has not fallen but sometimes feels off balance.  He states that he finished his physical/occupational/speech therapy and feels that it went well.  He is not exercising on his own.  He initially states that he does not feel that he has time, but also states that he does not  go into work until 2 PM and works until 10:30 PM.  This is a change for him.  He was able to change his job at [employer] and seems like that is doing better.  His wife mentions that he continues to have memory loss that she thinks has increased, but she also states that she has noticed memory loss ever since starting on antidepressants.  He had one fall since last visit.  This was when he was taking the trash to the street.  He had no fractures with this, just a superficial scrape.    05/25/15 update:  The patient presents today, accompanied by his wife who supplements the history.  His Requip was increased last visit so that was taking 2 tablets in the morning, one in afternoon and 2 in the evening and 1 day after I started this, I received a call from his wife that the patient did not  tolerate it and it made symptoms worse (which is what they reported with Mirapex).  While that was very odd to me, I told them to go back down to 1 mg 3 times per day.  He actually comes back on 1 mg, 1 in the AM, 2 in the afternoon and 1 in the evening.  He reports that he is doing worse.  He has more tremor and he feels tired in the evening.  Pts wife asks about trying to go back up on the requip slowly.   He had neuropsych testing in June at [facility].  I reviewed that.  There was no evidence of dementia.  There was evidence of mild cognitive impairment from Parkinson's disease as well as from chronic major depressive disorder.  It is recommended that the patient consider transcranial magnetic stimulation for depression, and he told the neuropsychologist that he considered in the past and opted against it.  She encouraged him to attend a Parkinson's support group and he told her that he tried that once and it was not helpful.  He still c/o "my memory has been terrible and especially over the last few weeks."  He feels that he is not depressed - "that is well controlled."  He is not doing CV exercise.  He is still in his new job but it seems more physical than his old job and he thinks he is not as fast in getting tasks done.  He asks about DBS.  08/25/15 update:  The patient is following up today, accompanied by his wife who supplements the history.  I have reviewed records since our last visit.  He is on Requip XL, which was increased to 6 mg at night last visit.  He changed that back to 2 mg tid of the regular ropinirole because of cost.   He is enrolled in physical therapy.  "I'm doing bad."  When asked to describe how he feels bad, he has trouble describing it.  He had a root canal and hasn't felt well since.  He hasn't been back to work for 4 weeks.  He feels that his speech is low and states that he went to voice therapy 2 weeks ago.  He isn't practicing the voice therapy faithfully.  He is  riding the bike on the highest resistance 20 mins a day.  He is having word finding trouble.   No falls but feels that he is "weaker."  He asks me about going to "light duty" at work and possibly retirement in the near future.  He  continues to see psychiatry for his depression.  He last saw psychiatry on 06/04/2015.  No changes were made in medication as he stated that mood was good and continues to reaffirm that.    11/30/15 update:  The patient follows up today, accompanied by his wife who supplements the history.  Last visit, his ropinirole was increased to 4 mg in the morning (8am but goes back to bed for another 1 hour or so), another 2 mg in the afternoon (1-2 pm) and evening (8-8:30pm).  I changed him to light duty at work last visit at his request.  He called me recently and wanted changed from being able to work 4 hours per day to a total of only 10 hours per week.  I told him that he would need a functional capacity evaluation for this, as I was not sure that this made sense from a Parkinson standpoint, given his stage of Parkinson's disease.  He was fairly frustrated with this recommendation, but I told him that I needed some objective evidence.  He reports that he is awaiting an appt for the FCE (has called).   He has been seeing psychiatry and I  actually received a call from his psychiatrist back in December that his psychiatrist felt that he needed to be on levodopa.  I did not think that was the case, but I was willing to do a levodopa challenge to see if that was the case.  He was scheduled to have that done, but the patient canceled that.  Does report that he thinks that depression under good control.   He denies falls.  Some lightheadedness when first gets up, but no near syncope.  Using bike daily for 15-20 minutes.  No hallucinations; rare visual distortions.  Wife thinks that he seems more stiff and has involuntary "tics and shaking" at night.  Waking up with AM headache.  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  03/29/16 update:  The patient follows up today, accompanied by his wife who supplements the history.  Last visit, his ropinirole was increased to 4 mg in the morning, 2 mg in the afternoon and 2 mg in the evening.  Has intermittent spells of increased stiffness, slower on stairs.  Riding recumbant bike at home.  He has been seeing psychiatry and is still on celexa and wellbutrin.  I reviewed last records with Dr. Adele Schilder and pt continued to c/o memory issues (long standing c/o) and was told to discuss with me as may be PD related.  Pt had neuropsych testing done about a year ago and demonstrated no neurodegenerative memory change related to parkinsons but did feel that depression was an issue affecting cognition.  Thinks that memory is worse. No falls since last visit.  He has attended PT/OT/ST from March-May.  He c/o drooling.  Also c/o talking at night and some tremor at night.  Happens most night.  Also c/o snoring and wife thinks that is due to weight and trying to cut back on sugars.  Has OSAS but doesn't use the CPAP.  Has some visual distortions but no hallucinations.  Can be frightening.  Having some urinary incontinence.  Saw urology in the past but not for incontinence.    05/30/16 update:  The patient follows up today, accompanied by his wife who supplements the history.  He is on ropinirole, 4 mg in the morning, 2 mg in the afternoon and 2 mg in the evening.  He denies compulsive behaviors.  He denies sleep attacks.  He is now attending the ACT gym and feels he is getting benefit from this.  He is getting scholarship information.  He had repeat neuropsych testing on August 2 with a follow-up with Dr. Si Raider on August 10.  This was largely consistent with his performance one year prior.  There is mild cognitive impairment consistent with Parkinson's disease and untreated obstructive sleep apnea syndrome.  There was evidence of major depressive disorder,  and Dr. Si Raider felt that there was long-standing personality issues that influenced the patient's view of the world in a negative fashion.  He saw urology since last visit and I did get a note from them.  It was not clear what medication he was tried on, just stated that he tried a beta 3 agonist (?  Myrbetriq).  Pt states that it was in fact myrbetriq that was added.  Pt states that it hasn't helped but wife thinks that he isn't getting up as much during the night.  He was getting up 3 times during the night and now he may get up 1-2 times during the night.  Having some dizziness when gets up for about 2 min.  Drinking 2 L of water a day.  Given up sugar completely.  I did order a split night PSG last time but results are not available.  He c/o increased drooling.  09/14/16 update:  Patient follows up today, accompanied by his wife who supplements the history.  He is on ropinirole, 4 mg in the morning, 2 mg in the afternoon and 2 mg in the evening.  Pt had one fall on the ice when it snowed but that was it.  Pt denies lightheadedness, near syncope.  No hallucinations.  Mood has been good.  He remains on a combination of Wellbutrin and Celexa for depression and thinks that this has been well controlled.  He saw psychiatry since our last visit.  He was referred to psychology for counseling.  He had a functional capacity evaluation on 09/01/2016.  Stated that the patient could exert up to 20 pounds of force occasionally and up to 10 pounds of force frequently, but it also stated that physical demand requirements are in excess of those for sedentary work.  It stated that any job that the patient should engage in should be rated "light work" when it requires walking or standing to a significant degree, when it requires sitting most of the time but entails pushing and/or pulling of leg or arm controls and/or when the job requires working at a production rate paced entailing constant pushing and/or pulling a materials  even if the weight of his materials is negligible.  It did state that based on the evaluation, the client was capable of sustaining light level of work for an 8 hour per day, 40 hour per week job.  Asks me about drooling.  Does not want to try botox.  Asks me about cough and swollen ankles.  C/o talking in sleep.  Will waken him out sleep.  Having trouble getting in and out of the cars (not so much his truck)..  Hits his head on the door jam.  Started PT last week.  Still very sleepy during the day.    12/06/16 update:  Patient has up today, accompanied by his wife who supplements the history.  Patient remains on ropinirole, 4 mg in the morning, 2 mg in the afternoon and 2 mg in the evening. Having  some illusions but once gets close on the object he will realize what it is.  Wife notes some increased tremor but patient doesn't.   He has 2 falls since our last visit.  One he tripped over a curb and the other he fell down stairs but he was close to the bottom and didn't get hurt.  He is working out and doing exercises.  He has issues with drooling, but does not want to pursue Myobloc, nor does he want Robinul.  He did attend rehabilitation since our last visit and I reviewed those records.  Still going to ACT bid.  Feels like speech is slurring.   04/10/17 update:  Patient seen today in follow-up, accompanied by his wife who supplements the history.  Patient is on ropinirole, 4 mg in the morning, 2 mg in the afternoon and 2 mg in the evening.  C/o ankle swelling.  Wondered if it was the [place].  I wanted to start him on carbidopa/levodopa 25/100, one tablet 3 times per day for last visit but he decided to hold on that. Interestingly, he saw Dr. Adele Schilder in June and I reviewed those records.  The patient had reported to him that he felt more stiff and tremulous and according to notes the psychiatrist told him that he thought he needed levodopa.   I was contacted by physical therapy that he was orthostatic in therapy.  He  came to our office for orthostatics and was not orthostatic that day.  We did give him a prescription for the abdominal compression Binder.  He isn't using that and didn't fill the RX.  We told him to increase hydration.  He is drinking 2 liters per day.  Is starting RSB today in [place].    Neuroimaging has previously been performed.  It is available for my review today and I reviewed it with him.  There is no BG disease.  ALLERGIES:  No Known Allergies  CURRENT MEDICATIONS:  Outpatient Encounter Prescriptions as of 04/10/2017  Medication Sig  . B Complex-C (B-COMPLEX WITH VITAMIN C) tablet Take 1 tablet by mouth daily.  Marland Kitchen buPROPion (WELLBUTRIN XL) 300 MG 24 hr tablet TAKE 1 TABLET BY MOUTH EVERY DAY IN THE MORNING  . citalopram (CELEXA) 40 MG tablet Take 1 tablet (40 mg total) by mouth daily.  . fesoterodine (TOVIAZ) 4 MG TB24 tablet Take 1 tablet (4 mg total) by mouth daily.  . Omega 3-6-9 Fatty Acids (OMEGA 3-6-9 PO) Take by mouth.  Marland Kitchen rOPINIRole (REQUIP) 2 MG tablet TAKE 2 TABLETS IN THE AM, 1 IN THE AFTERNOON AND 1 IN THE EVENING  . sildenafil (VIAGRA) 100 MG tablet Take 1 tablet (100 mg total) by mouth daily as needed for erectile dysfunction.  . TURMERIC CURCUMIN PO Take by mouth.  . [DISCONTINUED] Elastic Bandages & Supports (ABDOMINAL BINDER/ELASTIC LARGE) MISC 1 Device by Does not apply route daily. DX: i95.1   No facility-administered encounter medications on file as of 04/10/2017.     PAST MEDICAL HISTORY:   Past Medical History:  Diagnosis Date  .  OSA (obstructive sleep apnea) 01/17/2011   npsg 2012:  AHI 67/hr. Auto titration 2012:  Optimal pressure 12cm.   . APPENDECTOMY, HX OF 02/18/2008   Qualifier: Diagnosis of  By: [place], [place]    . Cardiac murmur    as a child  . Depression   . Headache(784.0)   . HEADACHES, HX OF 02/18/2008   Qualifier: Diagnosis of  By: Danny Lawless CMA, [place]    .  Parkinson disease ([place]) 11/2014  . Streptococcal meningitis    as an infant     PAST SURGICAL HISTORY:   Past Surgical History:  Procedure Laterality Date  . APPENDECTOMY  1967  . NASAL SINUS SURGERY     x 4 as a child  . VASECTOMY      SOCIAL HISTORY:   Social History   Social History  . Marital status: Married    Spouse name: N/A  . Number of children: Y  . Years of education: N/A   Occupational History  . 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 Smoking status: Never Smoker  . Smokeless tobacco: Never Used  . Alcohol use No  . Drug use: No  . Sexual activity: Yes    Birth control/ protection: None   Other Topics Concern  . Not on file   Social History Narrative  . No narrative on file    FAMILY HISTORY:   Family Status  Relation Status  . Father Deceased       lung cancer, alzheimer's  . Mother Alive       HTN, A fib  . Brother Deceased       accident  . Sister Alive       healthy  . Son Alive       healthy  . Daughter Alive       healthy  . Daughter Alive       healthy  . Mat Aunt Alive       Parkinson's Disease    ROS:  A complete 10 system review of systems was obtained and was unremarkable apart from what is mentioned above.  PHYSICAL EXAMINATION:    VITALS:   Vitals:   04/10/17 0810  BP: 122/70  Pulse: 72  SpO2: 93%  Weight: 290 lb (131.5 kg)  Height: 6\' 2"  (1.88 m)   Wt Readings from Last 3 Encounters:  04/10/17 290 lb (131.5 kg)  03/20/17 290 lb (131.5 kg)  12/06/16 287 lb (130.2 kg)     GEN:  The patient appears stated age and is in NAD. HEENT:  Normocephalic, atraumatic.  The mucous membranes are moist. The superficial temporal arteries are without ropiness or tenderness. CV:  RRR Lungs:  CTAB Neck/HEME:  There are no carotid bruits bilaterally. MS:  Has minor LE edema  Neurological examination:  Orientation: The patient is alert and oriented x3.  Cranial nerves: There is good facial symmetry. 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   The speech is fluent and  clear.  He is hypophonic.  The patient is able to make the gutteral sounds without difficulty. Soft palate rises symmetrically and there is no tongue deviation. Hearing is intact to conversational tone. Sensation: Sensation is intact to light touch throughout Motor: Strength is 5/5 in the bilateral upper and lower extremities.   Shoulder shrug is equal and symmetric.  There is no pronator drift.   Movement examination: Tone: There is normal tone bilaterally Abnormal movements: There is no tremor today Coordination:  There is decremation with RAM's, seen with hand opening and closing on the left, finger taps on the bilaterally.  He is better with alternation of supination/pronation of the forearm.  Toe taps are slow on the left. Gait and Station: The patient has mild difficulty arising out of a deep-seated chair without the use of the hands.   The patient's stride length is decreased with decreased arm swing bilaterally but  more so on the L.  He is slow with decreased arm swing bilateral.  Neg pull test.    ASSESSMENT/PLAN:  1.  Parkinsonism  - Continue requip 4mg /2mg /2mg .  Thinks tremor is improved.  Told him that the requip could cause some degree of swelling in legs.  He wanted to go up on this instead of starting levodopa but having some visual distortions and illusions so told him don't want to do that.  -tallked again about starting low dose levodopa due to more falls, balance, trouble getting in/out of chairs and fatigue and illusions with requip.  Would start with carbidopa/levodopa 25/100 tid.  Risks, benefits, side effects and alternative therapies were discussed.  The opportunity to ask questions was given and they were answered to the best of my ability.  The patient decided to go ahead and try that.    -he is still in ACT classes with scholarship program and congratulated him on that.  He is starting RSB today and I am really proud of all of this today.  -handicap parking placard filled  out today 2  Depression  -The patient reports that this has been "resolved."  He is not suicidal or homicidal.  He will remain on Celexa and Wellbutrin and remain under the care of psychiatry. 3.  Sialorrhea  -refuses botox but has asked me multiple times about what to do about this.  Will let me know if changes mind  -offered robinul but doesn't want to to try it. 4.  Dizziness and occasional Orthostatic hypotension  -drinking plenty of fluid and markedly improved with that  -has abdominal compression binder but isn't using. Told him to fill that RX.   His BP is really too high in the sitting position to start meds to raise the BP. 5.  Memory loss  -He had repeat neuropsych testing on August 2 with a follow-up with Dr. Si Raider on August 10.  This was largely consistent with his performance one year prior.  There is mild cognitive impairment consistent with Parkinson's disease and untreated obstructive sleep apnea syndrome.  There was evidence of major depressive disorder, and Dr. Si Raider felt that there was long-standing personality issues that influenced the patient's view of the world in a negative fashion.  There was not evidence of dementia at this point in time. 6.  REM behavior d/o  -just talking at night.  Doesn't want any medication.  Educated patient.  Will let me know if starts acting out the dreams/falling out of bed 7.  Urinary incontinence, frequency and nocturia  -following with [facility] and on toviaz and is helping 8.  Follow-up with me will be in the next 4 months, sooner should new neurologic issues arise.      Much greater than 50% of this visit was spent in counseling with the patient and the family.  Total face to face time:  35 min.

## 2017-04-10 ENCOUNTER — Ambulatory Visit: Payer: Federal, State, Local not specified - PPO

## 2017-04-10 ENCOUNTER — Ambulatory Visit (INDEPENDENT_AMBULATORY_CARE_PROVIDER_SITE_OTHER): Payer: Federal, State, Local not specified - PPO | Admitting: Neurology

## 2017-04-10 ENCOUNTER — Ambulatory Visit: Payer: Federal, State, Local not specified - PPO | Admitting: Physical Therapy

## 2017-04-10 ENCOUNTER — Ambulatory Visit: Payer: Federal, State, Local not specified - PPO | Admitting: Occupational Therapy

## 2017-04-10 ENCOUNTER — Encounter: Payer: Self-pay | Admitting: Neurology

## 2017-04-10 VITALS — BP 122/70 | HR 72 | Ht 74.0 in | Wt 290.0 lb

## 2017-04-10 DIAGNOSIS — R29898 Other symptoms and signs involving the musculoskeletal system: Secondary | ICD-10-CM

## 2017-04-10 DIAGNOSIS — I951 Orthostatic hypotension: Secondary | ICD-10-CM

## 2017-04-10 DIAGNOSIS — R293 Abnormal posture: Secondary | ICD-10-CM

## 2017-04-10 DIAGNOSIS — R2689 Other abnormalities of gait and mobility: Secondary | ICD-10-CM

## 2017-04-10 DIAGNOSIS — R2681 Unsteadiness on feet: Secondary | ICD-10-CM

## 2017-04-10 DIAGNOSIS — G20 Parkinson's disease: Secondary | ICD-10-CM | POA: Diagnosis not present

## 2017-04-10 DIAGNOSIS — R471 Dysarthria and anarthria: Secondary | ICD-10-CM

## 2017-04-10 DIAGNOSIS — F331 Major depressive disorder, recurrent, moderate: Secondary | ICD-10-CM

## 2017-04-10 DIAGNOSIS — R29818 Other symptoms and signs involving the nervous system: Secondary | ICD-10-CM

## 2017-04-10 DIAGNOSIS — R278 Other lack of coordination: Secondary | ICD-10-CM

## 2017-04-10 DIAGNOSIS — R4701 Aphasia: Secondary | ICD-10-CM

## 2017-04-10 DIAGNOSIS — R41841 Cognitive communication deficit: Secondary | ICD-10-CM

## 2017-04-10 DIAGNOSIS — R41840 Attention and concentration deficit: Secondary | ICD-10-CM

## 2017-04-10 MED ORDER — CARBIDOPA-LEVODOPA 25-100 MG PO TABS: 1.0000 | ORAL_TABLET | Freq: Three times a day (TID) | ORAL | 1 refills | Status: DC

## 2017-04-10 NOTE — Therapy (Signed)
[facility] [address] [place], [place], [address] Phone: [phone]   Fax:  [phone]  Speech Language Pathology Treatment  Patient Details  Name: [name] [name] MRN: [id number] Date of Birth: 1951-08-18 Referring Provider: Alonza Bogus, DO  Encounter Date: 04/10/2017      End of Session - 04/10/17 1359    Visit Number 6   Number of Visits 17   Date for SLP Re-Evaluation 06/02/17   Authorization Type 75 total visits   Authorization - Visit Number 6   Authorization - Number of Visits 25   SLP Start Time 1320   SLP Stop Time  1400   SLP Time Calculation (min) 40 min   Activity Tolerance Patient tolerated treatment well      Past Medical History:  Diagnosis Date  .  OSA (obstructive sleep apnea) 01/17/2011   npsg 2012:  AHI 67/hr. Auto titration 2012:  Optimal pressure 12cm.   . APPENDECTOMY, HX OF 02/18/2008   Qualifier: Diagnosis of  By: [place], [place]    . Cardiac murmur    as a child  . Depression   . Headache(784.0)   . HEADACHES, HX OF 02/18/2008   Qualifier: Diagnosis of  By: Danny Lawless CMA, [place]    . Parkinson disease ([place]) 11/2014  . Streptococcal meningitis    as an infant    Past Surgical History:  Procedure Laterality Date  . APPENDECTOMY  1967  . NASAL SINUS SURGERY     x 4 as a child  . VASECTOMY      There were no vitals filed for this visit.      Subjective Assessment - 04/10/17 1326    Currently in Pain? No/denies               ADULT SLP TREATMENT - 04/10/17 1327      General Information   Behavior/Cognition Alert;Cooperative;Pleasant mood     Treatment Provided   Treatment provided Cognitive-Linquistic     Cognitive-Linquistic Treatment   Treatment focused on Dysarthria   Skilled Treatment Loud /a/  to recalibrate loudness with "[facility]" to facilitate volume - no disordered voice ("frogginess") with this means of production. Average low-90s dB.  Multiple-sentence responses with average low-70s dB. Simple conversation average of low 70s dB, with rare min A for loudness. SLP asked pt how consistent he has been with loud /a/ and with facial extremes - pt has been noncomliant with frequency "Not too much," was pt's response when asked how he had been keeping up with loud /a/ and facial exercises. Reiterated need to complete as prescribed.     Assessment / Recommendations / Plan   Plan Continue with current plan of care     Progression Toward Goals   Progression toward goals Progressing toward goals            SLP Short Term Goals - 04/10/17 1359      SLP SHORT TERM GOAL #1   Title Pt will average loud /a/ of 88dB over 3 sessions   Baseline 04-10-17   Time 2   Period Weeks   Status On-going     SLP SHORT TERM GOAL #2   Title Pt will average low-70s dB  in 10 minutes simple conversation with rare min A over 3 sessions   Baseline 04-10-17   Time 2   Period Weeks   Status On-going     SLP SHORT TERM GOAL #3   Title pt will demo working memory  skills appropriate for recall of 80% of details   Time 2   Period Weeks   Status On-going     SLP SHORT TERM GOAL #4   Title pt will tell SLP three ways to compensate for verbal expression errors/events over two sessions   Time 2   Period Weeks   Status On-going          SLP Long Term Goals - 04/10/17 1400      SLP LONG TERM GOAL #1   Title pt to maintain average 70dB in 15 minutes mod complex conversation outside [place] room over three sessions   Time 6   Period Weeks   Status On-going     SLP LONG TERM GOAL #2   Title pt will demo aphasia compenastions functionally in 8 minutes mod complex conversation over two sessions   Time 6   Period Weeks   Status On-going     SLP LONG TERM GOAL #3   Title pt will demo working memory skills adequate for functional verbal expression in 15 minutes mod complex conversation over two sessions   Time 6   Period Weeks   Status On-going           Plan - 04/10/17 1359    Clinical Impression Statement Pt cont'd to present with reduced conversational loudness from dysarthria due to Parkinson's disease, which impedes pt's overall ability to communicate effectively . Pt would cont to benefit from skilled ST addressing conversational loudness and other areas of language and cogntive-linguistics,.   Speech Therapy Frequency 2x / week   Duration --  8 weeks   Treatment/Interventions Language facilitation;Internal/external aids;Compensatory techniques;SLP instruction and feedback;Multimodal communcation approach;Cognitive reorganization;Functional tasks;Cueing hierarchy;Patient/family education   Potential to Achieve Goals Good   Potential Considerations Severity of impairments      Patient will benefit from skilled therapeutic intervention in order to improve the following deficits and impairments:   Dysarthria and anarthria  Aphasia  Cognitive communication deficit    Problem List Patient Active Problem List   Diagnosis Date Noted  . Parkinson's disease ([place]) 03/20/2017  . Bilateral lower extremity edema 03/20/2017  . Cerumen impaction 09/13/2016  . Chronic venous insufficiency 09/13/2016  . Fatigue 03/15/2016  . Parkinsonian features 09/15/2015  . ED (erectile dysfunction) 09/15/2015  . MDD (major depressive disorder), recurrent episode, severe ([place]) 02/10/2014  . Nonspecific abnormal electrocardiogram (ECG) (EKG) 02/07/2014  . Non-compliant behavior 02/03/2014  . Morton's neuroma of left foot 08/07/2013  . 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 Hyperglycemia 01/26/2012  . Hypogonadism male 01/24/2012  . Syncope and collapse 01/20/2012  .  OSA (obstructive sleep apnea) 01/17/2011  . CHEST TIGHTNESS 02/18/2008    [name] [name] ,[place], CCC-SLP  04/10/2017, 2:01 PM  [facility] [address] [place], [place],  [address] Phone: [phone]   Fax:  [phone]   Name: [name] [name] MRN: [id number] Date of Birth: [birth year]

## 2017-04-10 NOTE — Therapy (Signed)
[facility] [address] [place], [place], [address] Phone: [phone]   Fax:  [phone]  Occupational Therapy Treatment  Patient Details  Name: [name] [name] MRN: [id number] Date of Birth: May 12, 1951 Referring Provider: Dr. [name] Tat   Encounter Date: 04/10/2017      OT End of Session - 04/10/17 1409    Visit Number 6   Number of Visits 17   Date for OT Re-Evaluation 05/06/17   Authorization Type BCBS   Authorization Time Period 75 visit limit combined    Authorization - Visit Number 6   Authorization - Number of Visits 25   OT Start Time 6256   OT Stop Time 1445   OT Time Calculation (min) 40 min   Activity Tolerance Patient tolerated treatment well   Behavior During Therapy [facility] for tasks assessed/performed      Past Medical History:  Diagnosis Date  .  OSA (obstructive sleep apnea) 01/17/2011   npsg 2012:  AHI 67/hr. Auto titration 2012:  Optimal pressure 12cm.   . APPENDECTOMY, HX OF 02/18/2008   Qualifier: Diagnosis of  By: [place], [place]    . Cardiac murmur    as a child  . Depression   . Headache(784.0)   . HEADACHES, HX OF 02/18/2008   Qualifier: Diagnosis of  By: Danny Lawless CMA, [place]    . Parkinson disease ([place]) 11/2014  . Streptococcal meningitis    as an infant    Past Surgical History:  Procedure Laterality Date  . APPENDECTOMY  1967  . NASAL SINUS SURGERY     x 4 as a child  . VASECTOMY      There were no vitals filed for this visit.      Subjective Assessment - 04/10/17 1408    Subjective  saw Dr. Carles Collet this morning and she wants to add medication (Sinemet)   Pertinent History Parkinson's disease, seeing illusions, hx of falls, hx of significant depression, mild cognitive impairment, sleep apnea, hypogonadism, ADD   Limitations orthostatic BP!!! monitor PRN   Patient Stated Goals improved balance and ease with ADLs   Currently in Pain? No/denies      PWR! Hands x10  with min cueing for incr elbow ext  Writing/copying sentences with min-mod cues to write bigger, incr spacing, use line as visual target.   Initially micrographia (good legibility), then min-mod decr in size, and progressed to good size.   Pt able to don/doff shoes/socks with min incr time.  In standing, functional reaching in diagonal pattern incorporating trunk rotation/wt. shift and PWR! Hands/reach to grasp/release cylinder objects using PWR! Hands/reach with min cueing and to keep feet apart.  Arm bike x66min level 1 for reciprocal movement with cues/target of at least 40rpms for intensity while maintaining movement amplitude/reciprocal movement (forward/backwards).   Pt maintained 28-43rpms with incr difficulty backwards.                              OT Short Term Goals - 04/10/17 1413      OT SHORT TERM GOAL #1   Title Pt will be independent with PD specific  HEP.--check STGs 04/05/17   Time 4   Period Weeks   Status New     OT SHORT TERM GOAL #2   Title Pt will verbalize understanding of ways to prevent future complications and current available/appropriate community resources prn.   Baseline -----   Time 4  Period Weeks   Status New     OT SHORT TERM GOAL #3   Title Pt will improve bilateral hand coordination as shown by fastening/unfastening 3 buttons in 50sec or less.   Baseline 61.60sec   Time 4   Period Weeks   Status New     OT SHORT TERM GOAL #4   Title Pt will report incr ease with donning socks/shoes using adaptive strategies/AE prn.   Time 4   Period Weeks   Status New     OT SHORT TERM GOAL #5   Title Pt will demonstrate ability to write a 3 sentences with 100% legibility and only min decrease in letter size.   Baseline mod micrographia, 100% legibility   Time 4   Period Weeks   Status New           OT Long Term Goals - 03/07/17 1745      OT LONG TERM GOAL #1   Title Pt will verbalize understanding of adapted strategies  for ADLs/IADLs--check LTGs 05/06/17   Time 8   Period Weeks   Status New     OT LONG TERM GOAL #2   Title Pt will demonstrate improved functional standing balance as evidenced by increasing LUE standing functional reach to 10 inches or greater   Baseline RUE 10 in, LUE 9 in   Time 8   Period Weeks   Status New     OT LONG TERM GOAL #3   Title Pt will improve bilateral hand coordination as shown by fastening/unfastening 3 buttons in 45sec or less.   Baseline 61.60sec   Time 8   Period Weeks   Status New     OT LONG TERM GOAL #4   Title Pt will be able to carry object in both hands when ambulating with direction changes and simple cognitive component without freezing for incr ease/safety with IADLs.   Baseline ----   Time 8   Period Weeks   Status New     OT LONG TERM GOAL #5   Title Pt will improve functional reaching/coordination for ADLs as shown by improving score on box and blocks test by at least 4 blocks bilaterally   Baseline 46 bilaterally   Time 8   Period Weeks   Status New     Long Term Additional Goals   Additional Long Term Goals Yes     OT LONG TERM GOAL #6   Title Pt will improve coordination of dominant R hand for ADLs as shown by improving time on 9-hole peg test by at least 4sec.   Baseline R-32.69sec, L-29.85sec   Time 8   Period Weeks   Status New               Plan - 04/10/17 1412    Clinical Impression Statement Pt is progressing towards goals with decr rigidity overall.  Cognition continues to be a barrier.  Pt to start Sinemet soon.   Rehab Potential Good   Current Impairments/barriers affecting progress: cognitive deficits- Pt is having orthostatic BP, monitor PRN   OT Frequency 2x / week   OT Duration 8 weeks   OT Treatment/Interventions Self-care/ADL training;Therapeutic exercise;Cognitive remediation/compensation;Visual/perceptual remediation/compensation;Neuromuscular education;Parrafin;Moist Heat;Fluidtherapy;Energy  conservation;Therapeutic exercises;Patient/family education;Balance training;Therapeutic activities;Passive range of motion;Manual Therapy;DME and/or AE instruction;Ultrasound;Cryotherapy;Functional Mobility Training   Plan Begin checking STGs;  large amplitude movements with functional activity   OT Home Exercise Plan stretches prior to dressing   Consulted and Agree with Plan of Care Patient  Patient will benefit from skilled therapeutic intervention in order to improve the following deficits and impairments:  Decreased cognition, Impaired flexibility, Decreased mobility, Decreased coordination, Decreased endurance, Decreased range of motion, Decreased strength, Impaired UE functional use, Impaired tone, Impaired perceived functional ability, Decreased safety awareness, Difficulty walking, Decreased balance, Decreased activity tolerance, Impaired vision/preception, Improper spinal/pelvic alignment  Visit Diagnosis: Other symptoms and signs involving the nervous system  Other symptoms and signs involving the musculoskeletal system  Other lack of coordination  Attention and concentration deficit  Abnormal posture  Other abnormalities of gait and mobility  Unsteadiness on feet    Problem List Patient Active Problem List   Diagnosis Date Noted  . Parkinson's disease ([place]) 03/20/2017  . Bilateral lower extremity edema 03/20/2017  . Cerumen impaction 09/13/2016  . Chronic venous insufficiency 09/13/2016  . Fatigue 03/15/2016  . Parkinsonian features 09/15/2015  . ED (erectile dysfunction) 09/15/2015  . MDD (major depressive disorder), recurrent episode, severe ([place]) 02/10/2014  . Nonspecific abnormal electrocardiogram (ECG) (EKG) 02/07/2014  . Non-compliant behavior 02/03/2014  . Morton's neuroma of left foot 08/07/2013  . 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 Hyperglycemia 01/26/2012  . Hypogonadism male 01/24/2012  . Syncope and  collapse 01/20/2012  .  OSA (obstructive sleep apnea) 01/17/2011  . CHEST TIGHTNESS 02/18/2008    [facility] 04/10/2017, 5:11 PM  [facility] [address] [place], [place], [address] Phone: [phone]   Fax:  [phone]  Name: [name] [name] MRN: [id number] Date of Birth: December 07, 1950   Vianne Bulls, OTR/L [facility] [address] [place], [place]  [address] [phone] phone [phone] 04/10/17 5:11 PM

## 2017-04-10 NOTE — Patient Instructions (Addendum)
Start carbidopa/levodopa 25/100,  1/2 tab three times a day before meals x 1 wk, then 1/2 in am & noon & 1 in evening for a week, then 1/2 in am &1 at noon &one in evening for a week, then 1 tablet three times a day before meals

## 2017-04-10 NOTE — Therapy (Signed)
[facility] [address] [place], [place], [address] Phone: [phone]   Fax:  [phone]  Physical Therapy Treatment  Patient Details  Name: [name] [name] MRN: [id number] Date of Birth: 1951/06/12 Referring Provider: Alonza Bogus, DO  Encounter Date: 04/10/2017      PT End of Session - 04/10/17 1942    Visit Number 5  New episode of care, eval 03/07/17   Number of Visits 17   Date for PT Re-Evaluation 05/06/17   Authorization Type BCBS Federal-75 visit limit combined   Authorization - Visit Number 5   PT Start Time 1233   PT Stop Time 1315   PT Time Calculation (min) 42 min   Activity Tolerance Patient tolerated treatment well   Behavior During Therapy [facility] for tasks assessed/performed      Past Medical History:  Diagnosis Date  .  OSA (obstructive sleep apnea) 01/17/2011   npsg 2012:  AHI 67/hr. Auto titration 2012:  Optimal pressure 12cm.   . APPENDECTOMY, HX OF 02/18/2008   Qualifier: Diagnosis of  By: [place], [place]    . Cardiac murmur    as a child  . Depression   . Headache(784.0)   . HEADACHES, HX OF 02/18/2008   Qualifier: Diagnosis of  By: Danny Lawless CMA, [place]    . Parkinson disease ([place]) 11/2014  . Streptococcal meningitis    as an infant    Past Surgical History:  Procedure Laterality Date  . APPENDECTOMY  1967  . NASAL SINUS SURGERY     x 4 as a child  . VASECTOMY      There were no vitals filed for this visit.      Subjective Assessment - 04/10/17 1237    Subjective Saw Dr. Carles Collet this morning and she wants me to start Sinemet, which I will do tomorrow.   Patient Stated Goals Pt's goals for therapy are to delay the symptoms of PD.   Currently in Pain? No/denies                         [facility] Adult PT Treatment/Exercise - 04/10/17 1304      Transfers   Transfers Sit to Stand;Stand to Sit   Sit to Stand 6: Modified independent (Device/Increase time)   Stand to  Sit 6: Modified independent (Device/Increase time)   Number of Reps 10 reps;Other sets (comment)  from 18" surface, then from 16" surface no UE support   Transfer Cueing Cues provided for increased forward lean     Ambulation/Gait   Ambulation/Gait Yes   Ambulation/Gait Assistance 6: Modified independent (Device/Increase time)   Ambulation Distance (Feet) 345 Feet   Assistive device None   Gait Pattern Step-through pattern;Decreased arm swing - right;Decreased arm swing - left;Decreased step length - right;Decreased step length - left;Decreased trunk rotation;Trunk flexed;Poor foot clearance - left;Poor foot clearance - right  Improved foot clearance and arm swing after foot ladder   Ambulation Surface Level;Indoor   Pre-Gait Activities Used floor ladder, single step in each rung, x at least 6 reps for increased step length and foot clearance.     Gait Comments Treadmill gait activities x 4 minutes, bilateral UE support, 1.2>1.4>1.6 mph, with cues for upright posture and increased step length/increased foot clearance, to prevent forward flexed posture and lower extremities lagging behind.     Balance   Balance Assessed Yes     Static Standing Balance   Single Leg Stance -  Right Leg 0.91  0.78 sec second trial   Single Leg Stance - Left Leg 2.1  2.68 sec second trial     Standardized Balance Assessment   Standardized Balance Assessment Timed Up and Go Test     Timed Up and Go Test   TUG Normal TUG   Normal TUG (seconds) 11.75     High Level Balance   High Level Balance Comments Lateral weightshifting at counter, x 10 reps with reaching, then x 10 reps with UE support for improved single limb stance.  Marching in place x 10 reps, with 2-3 second hold for increased SLS.  Discussed how activities at [facility] can translate into improved functional mobility, just as PT activities, with increased attention to deliberate, large amplitude movement patterns.                   PT Short Term Goals - 04/10/17 1238      PT SHORT TERM GOAL #1   Title Pt will be independent with HEP to target Parkinson's specific deficits.  TARGET 04/05/17   Baseline Pt able to perform HEP in therapy sessions, but reports not doing HEP at home.   Time 4   Period Weeks   Status Not Met     PT SHORT TERM GOAL #2   Title Pt will improve TUG score to less than or equal to 13.5 seconds for decreased fall risk.   Baseline 11.75 sec 04/10/17   Time 4   Period Weeks   Status Achieved     PT SHORT TERM GOAL #3   Title Pt will improve SLS to at least 3 seconds bilateral lower extremities for improved stair negotiation and obstacle negotiation.   Baseline <2 seconds bilateral   Time 4   Period Weeks   Status Not Met     PT SHORT TERM GOAL #4   Title Pt will perform 8 of 10 reps of sit<>stand transfers from 18" surfaces and below, with no UE support, no posterior lean, independently.   Time 4   Period Weeks   Status Achieved     PT SHORT TERM GOAL #5   Title Pt will negotiate at least 4 steps, 6 reps, with/without rail, alternating pattern, modified independently for safety with stair negotiation.   Time 4   Period Weeks   Status Achieved           PT Long Term Goals - 04/10/17 1946      PT LONG TERM GOAL #1   Title Pt will verbalize understanding of fall prevention/tips to reduce freezing with gait.  TARGET 05/05/17   Time 8   Period Weeks   Status On-going     PT LONG TERM GOAL #2   Title Pt will improve TUG cognitive to less than or equal to 15 seconds for decreased fall risk/improved dual tasking with gait.   Time 8   Period Weeks   Status On-going     PT LONG TERM GOAL #3   Title Pt will improve MiniBESTest to at least 24/28 for decreased fall risk.   Time 8   Period Weeks   Status On-going     PT LONG TERM GOAL #4   Title Pt will improve gait velocity to at least 2.62 ft/sec for improved gait efficiency and safety.   Time 8   Period Weeks   Status  On-going     PT LONG TERM GOAL #5   Title Pt will verbalize plans  for ongoing community fitness upon D/C from PT.   Time 8   Period Weeks   Status On-going               Plan - 04/10/17 1942    Clinical Impression Statement STGs assessed this visit, with pt meeting STG 2, 4, and 5.  Pt is improving with SLS, but has not met STG 3.  STG 1 not met, as pt performs HEP in therapy sessions, but reports not performing at home.  He has improved TUG score and improvement noted with gait activities today using floor ladder as cueing for increased step length and foot clearance.  Pt will continue to benefit from further skilled PT to address gait, balance and functional strengthening.   Rehab Potential Good   Clinical Impairments Affecting Rehab Potential  short term memory issues per speech eval   PT Frequency 2x / week   PT Duration 8 weeks  plus eval   PT Treatment/Interventions ADLs/Self Care Home Management;Functional mobility training;Stair training;Gait training;Patient/family education;Neuromuscular re-education;Balance training;Therapeutic exercise;Therapeutic activities   PT Next Visit Plan Gait training on treadmill again with focus on upright posture, increased step length OR try floor ladder for increased step length; activities for SLS; stair negotiation while carrying objects   Consulted and Agree with Plan of Care Patient      Patient will benefit from skilled therapeutic intervention in order to improve the following deficits and impairments:  Abnormal gait, Decreased balance, Decreased mobility, Decreased strength, Difficulty walking, Impaired flexibility, Postural dysfunction, Impaired tone  Visit Diagnosis: Other abnormalities of gait and mobility  Unsteadiness on feet  Abnormal posture     Problem List Patient Active Problem List   Diagnosis Date Noted  . Parkinson's disease ([place]) 03/20/2017  . Bilateral lower extremity edema 03/20/2017  . Cerumen impaction  09/13/2016  . Chronic venous insufficiency 09/13/2016  . Fatigue 03/15/2016  . Parkinsonian features 09/15/2015  . ED (erectile dysfunction) 09/15/2015  . MDD (major depressive disorder), recurrent episode, severe ([place]) 02/10/2014  . Nonspecific abnormal electrocardiogram (ECG) (EKG) 02/07/2014  . Non-compliant behavior 02/03/2014  . Morton's neuroma of left foot 08/07/2013  . 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 Hyperglycemia 01/26/2012  . Hypogonadism male 01/24/2012  . Syncope and collapse 01/20/2012  .  OSA (obstructive sleep apnea) 01/17/2011  . CHEST TIGHTNESS 02/18/2008    Greig Altergott W. 04/10/2017, 7:48 PM  Frazier Butt., PT   [facility] [address] [place], [place], [address] Phone: [phone]   Fax:  [phone]  Name: [name] [name] MRN: [id number] Date of Birth: [birth year]

## 2017-04-14 ENCOUNTER — Ambulatory Visit: Payer: Federal, State, Local not specified - PPO

## 2017-04-14 ENCOUNTER — Ambulatory Visit: Payer: Federal, State, Local not specified - PPO | Admitting: Physical Therapy

## 2017-04-14 ENCOUNTER — Encounter: Payer: Self-pay | Admitting: Physical Therapy

## 2017-04-14 ENCOUNTER — Ambulatory Visit: Payer: Federal, State, Local not specified - PPO | Admitting: Occupational Therapy

## 2017-04-14 VITALS — BP 124/82

## 2017-04-14 DIAGNOSIS — R471 Dysarthria and anarthria: Secondary | ICD-10-CM

## 2017-04-14 DIAGNOSIS — R2689 Other abnormalities of gait and mobility: Secondary | ICD-10-CM

## 2017-04-14 DIAGNOSIS — R2681 Unsteadiness on feet: Secondary | ICD-10-CM

## 2017-04-14 DIAGNOSIS — R41841 Cognitive communication deficit: Secondary | ICD-10-CM

## 2017-04-14 DIAGNOSIS — R278 Other lack of coordination: Secondary | ICD-10-CM

## 2017-04-14 DIAGNOSIS — R293 Abnormal posture: Secondary | ICD-10-CM | POA: Diagnosis not present

## 2017-04-14 DIAGNOSIS — R29818 Other symptoms and signs involving the nervous system: Secondary | ICD-10-CM

## 2017-04-14 DIAGNOSIS — R4701 Aphasia: Secondary | ICD-10-CM

## 2017-04-14 DIAGNOSIS — R29898 Other symptoms and signs involving the musculoskeletal system: Secondary | ICD-10-CM

## 2017-04-14 DIAGNOSIS — R41840 Attention and concentration deficit: Secondary | ICD-10-CM

## 2017-04-14 NOTE — Therapy (Signed)
[facility] [address] [place], [place], [address] Phone: [phone]   Fax:  [phone]  Occupational Therapy Treatment  Patient Details  Name: [name] [name] MRN: [id number] Date of Birth: 1951-08-21 Referring Provider: Dr. [name] Tat   Encounter Date: 04/14/2017      OT End of Session - 04/14/17 1325    Visit Number 7   Number of Visits 17   Date for OT Re-Evaluation 05/06/17   Authorization Type BCBS   Authorization Time Period 75 visit limit combined    Authorization - Visit Number 7   Authorization - Number of Visits 25   OT Start Time 1404   OT Stop Time 1445   OT Time Calculation (min) 41 min   Activity Tolerance Patient tolerated treatment well   Behavior During Therapy [facility] for tasks assessed/performed      Past Medical History:  Diagnosis Date  .  OSA (obstructive sleep apnea) 01/17/2011   npsg 2012:  AHI 67/hr. Auto titration 2012:  Optimal pressure 12cm.   . APPENDECTOMY, HX OF 02/18/2008   Qualifier: Diagnosis of  By: [place], [place]    . Cardiac murmur    as a child  . Depression   . Headache(784.0)   . HEADACHES, HX OF 02/18/2008   Qualifier: Diagnosis of  By: Danny Lawless CMA, [place]    . Parkinson disease ([place]) 11/2014  . Streptococcal meningitis    as an infant    Past Surgical History:  Procedure Laterality Date  . APPENDECTOMY  1967  . NASAL SINUS SURGERY     x 4 as a child  . VASECTOMY      There were no vitals filed for this visit.      Subjective Assessment - 04/14/17 1324    Subjective  Went to [employer] boxing Monday.   Pertinent History Parkinson's disease, seeing illusions, hx of falls, hx of significant depression, mild cognitive impairment, sleep apnea, hypogonadism, ADD   Limitations orthostatic BP!!! monitor PRN   Patient Stated Goals improved balance and ease with ADLs   Currently in Pain? No/denies         Neuro re-ed:  PWR! Moves (up, rock,  twist) in prone with min cues For incr movement amplitude and technique.  Arm bike x74min level 1 for reciprocal movement with cues/target of at least 40rpms for intensity while maintaining movement amplitude/reciprocal movement.   Pt maintained 35-40rpms.  Backwards/forwards   Reviewed principles of exercise for boxing including use of large amplitude, good posture, and keeping feet apart                  OT Education - 04/14/17 1651    Education Details Timing of medication/Reviewed possible side affects of Sinemet and to notify MD if symptoms worsen; recommended pt eat toast/crackers with Sinemet due to reports of mild nausea; Discussed nonmotor symptoms of PD and answered questions as able   Person(s) Educated Patient   Methods Explanation   Comprehension Verbalized understanding          OT Short Term Goals - 04/14/17 1440      OT SHORT TERM GOAL #1   Title Pt will be independent with PD specific  HEP.--check STGs 04/05/17   Time 4   Period Weeks   Status On-going     OT SHORT TERM GOAL #2   Title Pt will verbalize understanding of ways to prevent future complications and current available/appropriate community resources prn.   Baseline -----  Time 4   Period Weeks   Status On-going     OT SHORT TERM GOAL #3   Title Pt will improve bilateral hand coordination as shown by fastening/unfastening 3 buttons in 50sec or less.   Baseline 61.60sec   Time 4   Period Weeks   Status New     OT SHORT TERM GOAL #4   Title Pt will report incr ease with donning socks/shoes using adaptive strategies/AE prn.   Time 4   Period Weeks   Status New     OT SHORT TERM GOAL #5   Title Pt will demonstrate ability to write a 3 sentences with 100% legibility and only min decrease in letter size.   Baseline mod micrographia, 100% legibility   Time 4   Period Weeks   Status On-going           OT Long Term Goals - 03/07/17 1745      OT LONG TERM GOAL #1   Title Pt will  verbalize understanding of adapted strategies for ADLs/IADLs--check LTGs 05/06/17   Time 8   Period Weeks   Status New     OT LONG TERM GOAL #2   Title Pt will demonstrate improved functional standing balance as evidenced by increasing LUE standing functional reach to 10 inches or greater   Baseline RUE 10 in, LUE 9 in   Time 8   Period Weeks   Status New     OT LONG TERM GOAL #3   Title Pt will improve bilateral hand coordination as shown by fastening/unfastening 3 buttons in 45sec or less.   Baseline 61.60sec   Time 8   Period Weeks   Status New     OT LONG TERM GOAL #4   Title Pt will be able to carry object in both hands when ambulating with direction changes and simple cognitive component without freezing for incr ease/safety with IADLs.   Baseline ----   Time 8   Period Weeks   Status New     OT LONG TERM GOAL #5   Title Pt will improve functional reaching/coordination for ADLs as shown by improving score on box and blocks test by at least 4 blocks bilaterally   Baseline 46 bilaterally   Time 8   Period Weeks   Status New     Long Term Additional Goals   Additional Long Term Goals Yes     OT LONG TERM GOAL #6   Title Pt will improve coordination of dominant R hand for ADLs as shown by improving time on 9-hole peg test by at least 4sec.   Baseline R-32.69sec, L-29.85sec   Time 8   Period Weeks   Status New               Plan - 04/14/17 1649    Clinical Impression Statement Pt is progressing towards goals with improving movement amplitude and decr rigidity.   Rehab Potential Good   Current Impairments/barriers affecting progress: cognitive deficits- Pt is having orthostatic BP, monitor PRN   OT Frequency 2x / week   OT Duration 8 weeks   OT Treatment/Interventions Self-care/ADL training;Therapeutic exercise;Cognitive remediation/compensation;Visual/perceptual remediation/compensation;Neuromuscular education;Parrafin;Moist Heat;Fluidtherapy;Energy  conservation;Therapeutic exercises;Patient/family education;Balance training;Therapeutic activities;Passive range of motion;Manual Therapy;DME and/or AE instruction;Ultrasound;Cryotherapy;Functional Mobility Training   Plan check STGs, large amplitude movements with functional activity, ?PWR! moves in prone   OT Home Exercise Plan stretches prior to dressing   Consulted and Agree with Plan of Care Patient      Patient  will benefit from skilled therapeutic intervention in order to improve the following deficits and impairments:  Decreased cognition, Impaired flexibility, Decreased mobility, Decreased coordination, Decreased endurance, Decreased range of motion, Decreased strength, Impaired UE functional use, Impaired tone, Impaired perceived functional ability, Decreased safety awareness, Difficulty walking, Decreased balance, Decreased activity tolerance, Impaired vision/preception, Improper spinal/pelvic alignment  Visit Diagnosis: Other symptoms and signs involving the nervous system  Other symptoms and signs involving the musculoskeletal system  Other lack of coordination  Attention and concentration deficit  Abnormal posture  Other abnormalities of gait and mobility  Unsteadiness on feet    Problem List Patient Active Problem List   Diagnosis Date Noted  . Parkinson's disease ([place]) 03/20/2017  . Bilateral lower extremity edema 03/20/2017  . Cerumen impaction 09/13/2016  . Chronic venous insufficiency 09/13/2016  . Fatigue 03/15/2016  . Parkinsonian features 09/15/2015  . ED (erectile dysfunction) 09/15/2015  . MDD (major depressive disorder), recurrent episode, severe ([place]) 02/10/2014  . Nonspecific abnormal electrocardiogram (ECG) (EKG) 02/07/2014  . Non-compliant behavior 02/03/2014  . Morton's neuroma of left foot 08/07/2013  . 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 Hyperglycemia 01/26/2012  . Hypogonadism male 01/24/2012  . Syncope  and collapse 01/20/2012  .  OSA (obstructive sleep apnea) 01/17/2011  . CHEST TIGHTNESS 02/18/2008    [facility] 04/14/2017, 4:54 PM  [facility] [address] [place], [place], [address] Phone: [phone]   Fax:  [phone]  Name: [name] [name] MRN: [id number] Date of Birth: Jul 29, 1951   Vianne Bulls, OTR/L [facility] [address] [place], [place]  [address] [phone] phone [phone] 04/14/17 4:54 PM

## 2017-04-14 NOTE — Therapy (Signed)
[facility] [address] [place], [place], [address] Phone: [phone]   Fax:  [phone]  Physical Therapy Treatment  Patient Details  Name: [name] [name] MRN: [id number] Date of Birth: 12/16/1950 Referring Provider: Kerin Salen, DO  Encounter Date: 04/14/2017      PT End of Session - 04/14/17 1543    Visit Number 6  New episode of care, eval 03/07/17   Number of Visits 17   Date for PT Re-Evaluation 05/06/17   Authorization Type BCBS Federal-75 visit limit combined   Authorization - Visit Number 6   PT Start Time 1445   PT Stop Time 1528   PT Time Calculation (min) 43 min   Activity Tolerance Patient tolerated treatment well   Behavior During Therapy Flat affect      Past Medical History:  Diagnosis Date  .  OSA (obstructive sleep apnea) 01/17/2011   npsg 2012:  AHI 67/hr. Auto titration 2012:  Optimal pressure 12cm.   . APPENDECTOMY, HX OF 02/18/2008   Qualifier: Diagnosis of  By: Genelle Gather CMA, [place]    . Cardiac murmur    as a child  . Depression   . Headache(784.0)   . HEADACHES, HX OF 02/18/2008   Qualifier: Diagnosis of  By: Genelle Gather CMA, [place]    . Parkinson disease ([facility]) 11/2014  . Streptococcal meningitis    as an infant    Past Surgical History:  Procedure Laterality Date  . APPENDECTOMY  1967  . NASAL SINUS SURGERY     x 4 as a child  . VASECTOMY      Vitals:  04/14/17 1458 04/14/17 1500 04/14/17 1503 04/14/17 1506 04/14/17 1525  BP: 130/80 119/73 115/71 114/82 124/82        Subjective Assessment - 04/14/17 1451    Subjective Having some nausea and dizziness with new medicine. Reports he has had no freezing episodes today   Patient Stated Goals Pt's goals for therapy are to delay the symptoms of PD.   Currently in Pain? No/denies                         [facility] Adult PT Treatment/Exercise - 04/14/17 1454      Ambulation/Gait   Ambulation/Gait Assistance 6:  Modified independent (Device/Increase time)   Ambulation Distance (Feet) 400 Feet  +0.11 mi on treadmill   Assistive device None   Gait Pattern Step-through pattern;Decreased arm swing - right;Decreased arm swing - left;Decreased step length - right;Decreased step length - left;Decreased trunk rotation;Trunk flexed;Poor foot clearance - left;Poor foot clearance - right   Ambulation Surface Level   Gait Comments Treadmill gait activities x 6 minutes, bilateral UE support (tried various hand-hold positions to promote upright posture--best with both hands on highest part of front bar/rail), 1.2 mph, with cues for upright posture and increased step length/increased foot clearance, to prevent forward flexed posture and lower extremities lagging behind. (0.11 mi total distance)           PWR [facility]) - 04/14/17 1539    PWR! exercises Moves in standing   PWR! Up x 10   PWR! Rock x10   PWR! Twist x10   Comments good technique with hands wide open               PT Short Term Goals - 04/10/17 1238      PT SHORT TERM GOAL #1   Title Pt will be independent with HEP to target  Parkinson's specific deficits.  TARGET 04/05/17   Baseline Pt able to perform HEP in therapy sessions, but reports not doing HEP at home.   Time 4   Period Weeks   Status Not Met     PT SHORT TERM GOAL #2   Title Pt will improve TUG score to less than or equal to 13.5 seconds for decreased fall risk.   Baseline 11.75 sec 04/10/17   Time 4   Period Weeks   Status Achieved     PT SHORT TERM GOAL #3   Title Pt will improve SLS to at least 3 seconds bilateral lower extremities for improved stair negotiation and obstacle negotiation.   Baseline <2 seconds bilateral   Time 4   Period Weeks   Status Not Met     PT SHORT TERM GOAL #4   Title Pt will perform 8 of 10 reps of sit<>stand transfers from 18" surfaces and below, with no UE support, no posterior lean, independently.   Time 4   Period Weeks   Status Achieved      PT SHORT TERM GOAL #5   Title Pt will negotiate at least 4 steps, 6 reps, with/without rail, alternating pattern, modified independently for safety with stair negotiation.   Time 4   Period Weeks   Status Achieved           PT Long Term Goals - 04/10/17 1946      PT LONG TERM GOAL #1   Title Pt will verbalize understanding of fall prevention/tips to reduce freezing with gait.  TARGET 05/05/17   Time 8   Period Weeks   Status On-going     PT LONG TERM GOAL #2   Title Pt will improve TUG cognitive to less than or equal to 15 seconds for decreased fall risk/improved dual tasking with gait.   Time 8   Period Weeks   Status On-going     PT LONG TERM GOAL #3   Title Pt will improve MiniBESTest to at least 24/28 for decreased fall risk.   Time 8   Period Weeks   Status On-going     PT LONG TERM GOAL #4   Title Pt will improve gait velocity to at least 2.62 ft/sec for improved gait efficiency and safety.   Time 8   Period Weeks   Status On-going     PT LONG TERM GOAL #5   Title Pt will verbalize plans for ongoing community fitness upon D/C from PT.   Time 8   Period Weeks   Status On-going               Plan - 04/14/17 1545    Clinical Impression Statement Patient reports he began taking Sinemet this week. States he has had nausea and dizziness since beginning to take medicine. BP assessed throughout session (both SBP and DBP dropped with sit to stand and took >10 minutes exercise to begin to increase). He was asymptomatic and decr in BP not large enough for true orthostasis. Continued difficulty maintaining upright posture on the treadmill with legs lagging behind his upper body, however over ground walking after treadmill was improved (step length, heel-strike and arm swing). Patient will continue to benefit from skilled PT to work towards personal goals.    Rehab Potential Good   Clinical Impairments Affecting Rehab Potential  short term memory issues per speech  eval   PT Frequency 2x / week   PT Duration 8 weeks  plus eval  PT Treatment/Interventions ADLs/Self Care Home Management;Functional mobility training;Stair training;Gait training;Patient/family education;Neuromuscular re-education;Balance training;Therapeutic exercise;Therapeutic activities   PT Next Visit Plan may need to monitor BP--did drop but not enough for true orthostasis; Gait training on treadmill again with focus on upright posture, increased step length OR try floor ladder for increased step length; activities for SLS; stair negotiation while carrying objects   Consulted and Agree with Plan of Care Patient      Patient will benefit from skilled therapeutic intervention in order to improve the following deficits and impairments:  Abnormal gait, Decreased balance, Decreased mobility, Decreased strength, Difficulty walking, Impaired flexibility, Postural dysfunction, Impaired tone  Visit Diagnosis: Abnormal posture  Other abnormalities of gait and mobility     Problem List Patient Active Problem List   Diagnosis Date Noted  . Parkinson's disease ([place]) 03/20/2017  . Bilateral lower extremity edema 03/20/2017  . Cerumen impaction 09/13/2016  . Chronic venous insufficiency 09/13/2016  . Fatigue 03/15/2016  . Parkinsonian features 09/15/2015  . ED (erectile dysfunction) 09/15/2015  . MDD (major depressive disorder), recurrent episode, severe ([place]) 02/10/2014  . Nonspecific abnormal electrocardiogram (ECG) (EKG) 02/07/2014  . Non-compliant behavior 02/03/2014  . Morton's neuroma of left foot 08/07/2013  . 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 Hyperglycemia 01/26/2012  . Hypogonadism male 01/24/2012  . Syncope and collapse 01/20/2012  .  OSA (obstructive sleep apnea) 01/17/2011  . CHEST TIGHTNESS 02/18/2008    Rexanne Mano, PT 04/14/2017, 3:54 PM  [facility] [address]  [place], [place], [address] Phone: [phone]   Fax:  [phone]  Name: [name] [name] MRN: [id number] Date of Birth: 1951/06/24

## 2017-04-14 NOTE — Therapy (Signed)
[facility] [address] [place], [place], [address] Phone: [phone]   Fax:  [phone]  Speech Language Pathology Treatment  Patient Details  Name: [name] [name] MRN: [id number] Date of Birth: Aug 11, 1951 Referring Provider: Alonza Bogus, DO  Encounter Date: 04/14/2017      End of Session - 04/14/17 1400    Visit Number 7   Number of Visits 17   Date for SLP Re-Evaluation 06/02/17   Authorization Type 75 total visits   Authorization - Visit Number 7   Authorization - Number of Visits 25   SLP Start Time 7017   SLP Stop Time  1400   SLP Time Calculation (min) 42 min   Activity Tolerance Patient tolerated treatment well      Past Medical History:  Diagnosis Date  .  OSA (obstructive sleep apnea) 01/17/2011   npsg 2012:  AHI 67/hr. Auto titration 2012:  Optimal pressure 12cm.   . APPENDECTOMY, HX OF 02/18/2008   Qualifier: Diagnosis of  By: [place], [place]    . Cardiac murmur    as a child  . Depression   . Headache(784.0)   . HEADACHES, HX OF 02/18/2008   Qualifier: Diagnosis of  By: Danny Lawless CMA, [place]    . Parkinson disease ([place]) 11/2014  . Streptococcal meningitis    as an infant    Past Surgical History:  Procedure Laterality Date  . APPENDECTOMY  1967  . NASAL SINUS SURGERY     x 4 as a child  . VASECTOMY      There were no vitals filed for this visit.      Subjective Assessment - 04/14/17 1330    Subjective Pt states he has completed loud /a/ at home. ("Heyyyyyy - ahhhhh")   Currently in Pain? No/denies               ADULT SLP TREATMENT - 04/14/17 1339      General Information   Behavior/Cognition Alert;Cooperative;Pleasant mood     Treatment Provided   Treatment provided Cognitive-Linquistic     Cognitive-Linquistic Treatment   Treatment focused on Dysarthria   Skilled Treatment Loud /a/  to recalibrate loudness with "[facility]" to facilitate volume -  more disordered voice ("frogginess") than last session but still successful 80% of the time. Average /a/ was in the low-90s dB. Multiple-sentence responses with average low-70s dB. Simple conversation average of upper 60s - low 70s dB, with nonverbal cues for loudness. SLP reiterated need to complete as prescribed.      Assessment / Recommendations / Plan   Plan Continue with current plan of care     Progression Toward Goals   Progression toward goals Progressing toward goals            SLP Short Term Goals - 04/14/17 1401      SLP SHORT TERM GOAL #1   Title Pt will average loud /a/ of 88dB over 3 sessions   Baseline 04-10-17, 04-14-17   Time 2   Period Weeks   Status On-going     SLP SHORT TERM GOAL #2   Title Pt will average low-70s dB  in 10 minutes simple conversation with rare min A over 3 sessions   Baseline 04-10-17   Time 2   Period Weeks   Status On-going     SLP SHORT TERM GOAL #3   Title pt will demo working memory skills appropriate for recall of 80% of details   Time 2  Period Weeks   Status On-going     SLP SHORT TERM GOAL #4   Title pt will tell SLP three ways to compensate for verbal expression errors/events over two sessions   Time 2   Period Weeks   Status On-going          SLP Long Term Goals - 04/14/17 1402      SLP LONG TERM GOAL #1   Title pt to maintain average 70dB in 15 minutes mod complex conversation outside [facility] room over three sessions   Time 6   Period Weeks   Status On-going     SLP LONG TERM GOAL #2   Title pt will demo aphasia compenastions functionally in 8 minutes mod complex conversation over two sessions   Time 6   Period Weeks   Status On-going     SLP LONG TERM GOAL #3   Title pt will demo working memory skills adequate for functional verbal expression in 15 minutes mod complex conversation over two sessions   Time 6   Period Weeks   Status On-going          Plan - 04/14/17 1401    Clinical Impression Statement Pt  cont'd to present with reduced conversational loudness from dysarthria due to Parkinson's disease, which impedes pt's overall ability to communicate effectively . Pt would cont to benefit from skilled ST addressing conversational loudness and other areas of language and cogntive-linguistics,.   Speech Therapy Frequency 2x / week   Duration --  8 weeks   Treatment/Interventions Language facilitation;Internal/external aids;Compensatory techniques;SLP instruction and feedback;Multimodal communcation approach;Cognitive reorganization;Functional tasks;Cueing hierarchy;Patient/family education   Potential to Achieve Goals Good   Potential Considerations Severity of impairments      Patient will benefit from skilled therapeutic intervention in order to improve the following deficits and impairments:   Dysarthria and anarthria  Aphasia  Cognitive communication deficit    Problem List Patient Active Problem List   Diagnosis Date Noted  . Parkinson's disease ([place]) 03/20/2017  . Bilateral lower extremity edema 03/20/2017  . Cerumen impaction 09/13/2016  . Chronic venous insufficiency 09/13/2016  . Fatigue 03/15/2016  . Parkinsonian features 09/15/2015  . ED (erectile dysfunction) 09/15/2015  . MDD (major depressive disorder), recurrent episode, severe ([place]) 02/10/2014  . Nonspecific abnormal electrocardiogram (ECG) (EKG) 02/07/2014  . Non-compliant behavior 02/03/2014  . Morton's neuroma of left foot 08/07/2013  . 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 Hyperglycemia 01/26/2012  . Hypogonadism male 01/24/2012  . Syncope and collapse 01/20/2012  .  OSA (obstructive sleep apnea) 01/17/2011  . CHEST TIGHTNESS 02/18/2008    Marybell Robards ,MS, CCC-SLP  04/14/2017, 2:02 PM  [facility] [address] [place], [place], [address] Phone: [phone]   Fax:  [phone]   Name: MIRZA FESSEL MRN: [id number] Date of Birth: 05-13-1951

## 2017-04-17 ENCOUNTER — Ambulatory Visit: Payer: Federal, State, Local not specified - PPO | Admitting: Occupational Therapy

## 2017-04-17 ENCOUNTER — Ambulatory Visit: Payer: Federal, State, Local not specified - PPO | Admitting: Physical Therapy

## 2017-04-17 ENCOUNTER — Telehealth: Payer: Self-pay | Admitting: Neurology

## 2017-04-17 ENCOUNTER — Encounter: Payer: Self-pay | Admitting: Physical Therapy

## 2017-04-17 VITALS — BP 116/69 | HR 75

## 2017-04-17 DIAGNOSIS — R29818 Other symptoms and signs involving the nervous system: Secondary | ICD-10-CM

## 2017-04-17 DIAGNOSIS — R2681 Unsteadiness on feet: Secondary | ICD-10-CM

## 2017-04-17 DIAGNOSIS — R293 Abnormal posture: Secondary | ICD-10-CM | POA: Diagnosis not present

## 2017-04-17 NOTE — Therapy (Signed)
[facility] [address] [place], [place], [address] Phone: [phone]   Fax:  [phone]  Occupational Therapy Treatment  Patient Details  Name: [name] [name] MRN: [id number] Date of Birth: [birth year] Referring Provider: Dr. [name] Tat   Encounter Date: 04/17/2017      OT End of Session - 04/17/17 1051    Visit Number --   Number of Visits 17   Date for OT Re-Evaluation 05/06/17   Authorization Type BCBS   Authorization Time Period 75 visit limit combined    Authorization - Visit Number --   Authorization - Number of Visits 25   OT Start Time 1025   OT Stop Time 1110   OT Time Calculation (min) 45 min   Activity Tolerance Patient tolerated treatment well   Behavior During Therapy Flat affect;WFL for tasks assessed/performed      Past Medical History:  Diagnosis Date  .  OSA (obstructive sleep apnea) 01/17/2011   npsg 2012:  AHI 67/hr. Auto titration 2012:  Optimal pressure 12cm.   . APPENDECTOMY, HX OF 02/18/2008   Qualifier: Diagnosis of  By: [place], [place]    . Cardiac murmur    as a child  . Depression   . Headache(784.0)   . HEADACHES, HX OF 02/18/2008   Qualifier: Diagnosis of  By: Danny Lawless CMA, [place]    . Parkinson disease ([place]) 11/2014  . Streptococcal meningitis    as an infant    Past Surgical History:  Procedure Laterality Date  . APPENDECTOMY  1967  . NASAL SINUS SURGERY     x 4 as a child  . VASECTOMY      There were no vitals filed for this visit.      Subjective Assessment - 04/17/17 1027    Subjective  double vision/visual changes, dizziness (spinning) lasting longer, incr rigidity and freezing   Pertinent History Parkinson's disease, seeing illusions, hx of falls, hx of significant depression, mild cognitive impairment, sleep apnea, hypogonadism, ADD   Limitations orthostatic BP!!! monitor PRN   Patient Stated Goals improved balance and ease with ADLs   Currently in  Pain? No/denies      Pt with nystagmus noted with visual tracking intermittently and overshooting with saccades.  Pt also reports intermittent diplopia and is observed to tilt head to the right.  Pt reports dizziness lasting longer than in the past when standing that started when he woke up this morning.  Pt also reports incr rigidity and more freezing.  Decr step size noted today, particularly in small space.  Called Dr. Doristine Devoid office and spoke to Margarette Asal ([place]) regarding pt's symptoms.   Also notified wife when she came in to pick up pt and recommended pt/wife follow up with Dr. Carles Collet.  Jade returned call after pt left and reports that she will call pt at home with instructions--see her telephone encounter.    (No treatment/monitored only--No charge).                         OT Short Term Goals - 04/14/17 1440      OT SHORT TERM GOAL #1   Title Pt will be independent with PD specific  HEP.--check STGs 04/05/17   Time 4   Period Weeks   Status On-going     OT SHORT TERM GOAL #2   Title Pt will verbalize understanding of ways to prevent future complications and current available/appropriate community resources prn.  Baseline -----   Time 4   Period Weeks   Status On-going     OT SHORT TERM GOAL #3   Title Pt will improve bilateral hand coordination as shown by fastening/unfastening 3 buttons in 50sec or less.   Baseline 61.60sec   Time 4   Period Weeks   Status New     OT SHORT TERM GOAL #4   Title Pt will report incr ease with donning socks/shoes using adaptive strategies/AE prn.   Time 4   Period Weeks   Status New     OT SHORT TERM GOAL #5   Title Pt will demonstrate ability to write a 3 sentences with 100% legibility and only min decrease in letter size.   Baseline mod micrographia, 100% legibility   Time 4   Period Weeks   Status On-going           OT Long Term Goals - 03/07/17 1745      OT LONG TERM GOAL #1   Title Pt will verbalize  understanding of adapted strategies for ADLs/IADLs--check LTGs 05/06/17   Time 8   Period Weeks   Status New     OT LONG TERM GOAL #2   Title Pt will demonstrate improved functional standing balance as evidenced by increasing LUE standing functional reach to 10 inches or greater   Baseline RUE 10 in, LUE 9 in   Time 8   Period Weeks   Status New     OT LONG TERM GOAL #3   Title Pt will improve bilateral hand coordination as shown by fastening/unfastening 3 buttons in 45sec or less.   Baseline 61.60sec   Time 8   Period Weeks   Status New     OT LONG TERM GOAL #4   Title Pt will be able to carry object in both hands when ambulating with direction changes and simple cognitive component without freezing for incr ease/safety with IADLs.   Baseline ----   Time 8   Period Weeks   Status New     OT LONG TERM GOAL #5   Title Pt will improve functional reaching/coordination for ADLs as shown by improving score on box and blocks test by at least 4 blocks bilaterally   Baseline 46 bilaterally   Time 8   Period Weeks   Status New     Long Term Additional Goals   Additional Long Term Goals Yes     OT LONG TERM GOAL #6   Title Pt will improve coordination of dominant R hand for ADLs as shown by improving time on 9-hole peg test by at least 4sec.   Baseline R-32.69sec, L-29.85sec   Time 8   Period Weeks   Status New               Plan - 04/17/17 1258    Clinical Impression Statement Pt only monitored today due to reports of incr rigidity, freezing, dizziness and new diplopia/visual deficits. Dr. Doristine Devoid office to follow up.   Rehab Potential Good   Current Impairments/barriers affecting progress: cognitive deficits- Pt is having orthostatic BP, monitor PRN   OT Frequency 2x / week   OT Duration 8 weeks   OT Treatment/Interventions Self-care/ADL training;Therapeutic exercise;Cognitive remediation/compensation;Visual/perceptual remediation/compensation;Neuromuscular  education;Parrafin;Moist Heat;Fluidtherapy;Energy conservation;Therapeutic exercises;Patient/family education;Balance training;Therapeutic activities;Passive range of motion;Manual Therapy;DME and/or AE instruction;Ultrasound;Cryotherapy;Functional Mobility Training   Plan check STGs, monitor BP/symptoms (diplopia)   OT Home Exercise Plan stretches prior to dressing   Consulted and Agree with Plan of Care  Patient      Patient will benefit from skilled therapeutic intervention in order to improve the following deficits and impairments:  Decreased cognition, Impaired flexibility, Decreased mobility, Decreased coordination, Decreased endurance, Decreased range of motion, Decreased strength, Impaired UE functional use, Impaired tone, Impaired perceived functional ability, Decreased safety awareness, Difficulty walking, Decreased balance, Decreased activity tolerance, Impaired vision/preception, Improper spinal/pelvic alignment  Visit Diagnosis: Other symptoms and signs involving the nervous system    Problem List Patient Active Problem List   Diagnosis Date Noted  . Parkinson's disease ([place]) 03/20/2017  . Bilateral lower extremity edema 03/20/2017  . Cerumen impaction 09/13/2016  . Chronic venous insufficiency 09/13/2016  . Fatigue 03/15/2016  . Parkinsonian features 09/15/2015  . ED (erectile dysfunction) 09/15/2015  . MDD (major depressive disorder), recurrent episode, severe ([place]) 02/10/2014  . Nonspecific abnormal electrocardiogram (ECG) (EKG) 02/07/2014  . Non-compliant behavior 02/03/2014  . Morton's neuroma of left foot 08/07/2013  . 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 Hyperglycemia 01/26/2012  . Hypogonadism male 01/24/2012  . Syncope and collapse 01/20/2012  .  OSA (obstructive sleep apnea) 01/17/2011  . CHEST TIGHTNESS 02/18/2008    [facility] 04/17/2017, 1:00 PM  [facility] [address] [place], [place], [address] Phone: [phone]   Fax:  [phone]  Name: [name] [name] MRN: [id number] Date of Birth: 1951-02-03   Vianne Bulls, OTR/L [facility] [address] [place], [place]  [address] [phone] phone [phone] 04/17/17 1:00 PM

## 2017-04-17 NOTE — Telephone Encounter (Signed)
Left message on machine for patient to call back.

## 2017-04-17 NOTE — Therapy (Signed)
[facility] [address] [place], [place], [address] Phone: [phone]   Fax:  [phone]  Physical Therapy Treatment  Patient Details  Name: [name] [name] MRN: [id number] Date of Birth: 1951-06-30 Referring Provider: Alonza Bogus, DO  Encounter Date: 04/17/2017      PT End of Session - 04/17/17 1934    Visit Number 7  New episode of care, eval 03/07/17   Number of Visits 17   Date for PT Re-Evaluation 05/06/17   Authorization Type BCBS [employer]-75 visit limit combined   Authorization - Visit Number 7   Authorization - Number of Visits --  75 PT/OT combined   PT Start Time 0931   PT Stop Time 1018   PT Time Calculation (min) 47 min   Activity Tolerance Treatment limited secondary to medical complications (Comment)  reports of spinning dizziness, fatigue, diplopia   Behavior During Therapy Flat affect      Past Medical History:  Diagnosis Date  .  OSA (obstructive sleep apnea) 01/17/2011   npsg 2012:  AHI 67/hr. Auto titration 2012:  Optimal pressure 12cm.   . APPENDECTOMY, HX OF 02/18/2008   Qualifier: Diagnosis of  By: [place], [place]    . Cardiac murmur    as a child  . Depression   . Headache(784.0)   . HEADACHES, HX OF 02/18/2008   Qualifier: Diagnosis of  By: Danny Lawless CMA, [place]    . Parkinson disease ([place]) 11/2014  . Streptococcal meningitis    as an infant    Past Surgical History:  Procedure Laterality Date  . APPENDECTOMY  1967  . NASAL SINUS SURGERY     x 4 as a child  . VASECTOMY      Vitals:   04/17/17 0950 04/17/17 1004  BP: (!) 100/47 116/69  Pulse: 77 75        Subjective Assessment - 04/17/17 0935    Subjective No nausea, dizziness is worse (spinning), very sleepy. Reports he woke up with spinning dizziness, different than the dizziness he was having last week.    Patient Stated Goals Pt's goals for therapy are to delay the symptoms of PD.                 Vestibular Assessment - 04/17/17 1013      Vestibular Assessment   General Observation Patient reports woke up feeling really dizzy today "spinning" Reports it starts whenever he moves and "is lasting a long time."      Symptom Behavior   Type of Dizziness Spinning   Frequency of Dizziness pt reports background dizziness with spinning and sense of falling "at times"   Duration of Dizziness background dizziness constant; spinning/falling <60 seconds during PT assessment   Aggravating Factors Activity in general   Relieving Factors Head stationary     Occulomotor Exam   Occulomotor Alignment Abnormal  dysconjugate gaze + ?skew deviation   Spontaneous Absent   Gaze-induced Absent   Smooth Pursuits Saccades   Saccades Slow     Positional Testing   Sidelying Test Sidelying Right;Sidelying Left   Horizontal Canal Testing Horizontal Canal Right;Horizontal Canal Left     Sidelying Right   Sidelying Right Duration sensation x 30 seconds    Sidelying Right Symptoms No nystagmus;Other (comment)  feeling of falling towards his back while on his rt side     Sidelying Left   Sidelying Left Duration 20 sec   Sidelying Left Symptoms No nystagmus;Other (comment)  sensation of  falling farther to the left than bed would allo                 [facility] Adult PT Treatment/Exercise - 04/17/17 0001      Lumbar Exercises: Stretches   Lower Trunk Rotation 2 reps;30 seconds   Lower Trunk Rotation Limitations with arms open in 90 degrees abdct for anterior chest stretch; pt required cues and slow passive stretch to LUE to fully extend elbow for UE to lie flat                  PT Short Term Goals - 04/10/17 1238      PT SHORT TERM GOAL #1   Title Pt will be independent with HEP to target Parkinson's specific deficits.  TARGET 04/05/17   Baseline Pt able to perform HEP in therapy sessions, but reports not doing HEP at home.   Time 4   Period Weeks   Status Not Met     PT SHORT  TERM GOAL #2   Title Pt will improve TUG score to less than or equal to 13.5 seconds for decreased fall risk.   Baseline 11.75 sec 04/10/17   Time 4   Period Weeks   Status Achieved     PT SHORT TERM GOAL #3   Title Pt will improve SLS to at least 3 seconds bilateral lower extremities for improved stair negotiation and obstacle negotiation.   Baseline <2 seconds bilateral   Time 4   Period Weeks   Status Not Met     PT SHORT TERM GOAL #4   Title Pt will perform 8 of 10 reps of sit<>stand transfers from 18" surfaces and below, with no UE support, no posterior lean, independently.   Time 4   Period Weeks   Status Achieved     PT SHORT TERM GOAL #5   Title Pt will negotiate at least 4 steps, 6 reps, with/without rail, alternating pattern, modified independently for safety with stair negotiation.   Time 4   Period Weeks   Status Achieved           PT Long Term Goals - 04/10/17 1946      PT LONG TERM GOAL #1   Title Pt will verbalize understanding of fall prevention/tips to reduce freezing with gait.  TARGET 05/05/17   Time 8   Period Weeks   Status On-going     PT LONG TERM GOAL #2   Title Pt will improve TUG cognitive to less than or equal to 15 seconds for decreased fall risk/improved dual tasking with gait.   Time 8   Period Weeks   Status On-going     PT LONG TERM GOAL #3   Title Pt will improve MiniBESTest to at least 24/28 for decreased fall risk.   Time 8   Period Weeks   Status On-going     PT LONG TERM GOAL #4   Title Pt will improve gait velocity to at least 2.62 ft/sec for improved gait efficiency and safety.   Time 8   Period Weeks   Status On-going     PT LONG TERM GOAL #5   Title Pt will verbalize plans for ongoing community fitness upon D/C from PT.   Time 8   Period Weeks   Status On-going               Plan - 04/17/17 1936    Clinical Impression Statement Patient noted to be walking with small, shuffling  steps, no arm swing (arms in  fact held rigidly in elbow flexion). Patient immediately reports he does not feel well. He reports dizziness "spinning" that began when he woke up this morning. BP assessed with hypotension. Assessed patient for BPPV with no nystagmus with all canals assessed. Patient does describe feeling like he is falling (posterior or to his left) even when he knows he is not. Initiated assessing vision (with relation to vertigo/vestibular assessment) and pt noted to have dysconjugate gaze and reporting diplopia. Performed stretching due to incr stiffness. Discussed patient's symptoms with Vianne Bulls, OTR/L and she confirmed symptoms did not sound like side effects of recently started Sinemet. See her note re: call to Dr. Carles Collet.    Rehab Potential Good   Clinical Impairments Affecting Rehab Potential  short term memory issues per speech eval   PT Frequency 2x / week   PT Duration 8 weeks  plus eval   PT Treatment/Interventions ADLs/Self Care Home Management;Functional mobility training;Stair training;Gait training;Patient/family education;Neuromuscular re-education;Balance training;Therapeutic exercise;Therapeutic activities   PT Next Visit Plan monitor BP--it drops sometimes; Gait training on treadmill again with focus on upright posture, increased step length OR try floor ladder for increased step length; activities for SLS; stair negotiation while carrying objects   Consulted and Agree with Plan of Care Patient      Patient will benefit from skilled therapeutic intervention in order to improve the following deficits and impairments:  Abnormal gait, Decreased balance, Decreased mobility, Decreased strength, Difficulty walking, Impaired flexibility, Postural dysfunction, Impaired tone  Visit Diagnosis: Abnormal posture  Unsteadiness on feet     Problem List Patient Active Problem List   Diagnosis Date Noted  . Parkinson's disease ([place]) 03/20/2017  . Bilateral lower extremity edema 03/20/2017  .  Cerumen impaction 09/13/2016  . Chronic venous insufficiency 09/13/2016  . Fatigue 03/15/2016  . Parkinsonian features 09/15/2015  . ED (erectile dysfunction) 09/15/2015  . MDD (major depressive disorder), recurrent episode, severe ([place]) 02/10/2014  . Nonspecific abnormal electrocardiogram (ECG) (EKG) 02/07/2014  . Non-compliant behavior 02/03/2014  . Morton's neuroma of left foot 08/07/2013  . 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 Hyperglycemia 01/26/2012  . Hypogonadism male 01/24/2012  . Syncope and collapse 01/20/2012  .  OSA (obstructive sleep apnea) 01/17/2011  . CHEST TIGHTNESS 02/18/2008    Rexanne Mano, PT 04/17/2017, 7:50 PM  [facility] [address] [place], [place], [address] Phone: [phone]   Fax:  [phone]  Name: NIKOS ANGLEMYER MRN: [id number] Date of Birth: 05/21/1951

## 2017-04-17 NOTE — Telephone Encounter (Signed)
Spoke with [name] [name] - patient had left already.  His blood pressure today was 100/47 laying 116/69 sitting.  They do not think he was wearing his binder.

## 2017-04-17 NOTE — Telephone Encounter (Signed)
Did they check his BP?  Is he wearing that abdominal compression binder I gave him a RX for?  If that double vision is new, then he needs eval in ER

## 2017-04-17 NOTE — Telephone Encounter (Signed)
Spoke with patient. Double vision did start this morning for the first time. Lasted two hours. It has gone away now. They are hesitant to go to the ER and insist this is related to Levodopa. He started Levodopa last week and this is the first time he has had double vision. I encouraged him to be evaluated in the ER. They will call with any other questions/problems.

## 2017-04-17 NOTE — Telephone Encounter (Signed)
Levada Dy with neurorehab called stating patient showed up for rehab today complaining of symptoms present since he woke up.  He states woke up dizzy and it is not going away. Having double vision. More rigid and freezing more.  Levada Dy states he will be at rehab for another twenty minutes and can be reached back at [phone].  Just started Levodopa on 04/10/17.  Please advise.

## 2017-04-18 ENCOUNTER — Ambulatory Visit: Payer: Federal, State, Local not specified - PPO | Admitting: Physical Therapy

## 2017-04-18 ENCOUNTER — Ambulatory Visit: Payer: Federal, State, Local not specified - PPO | Admitting: Occupational Therapy

## 2017-04-18 ENCOUNTER — Ambulatory Visit: Payer: Federal, State, Local not specified - PPO

## 2017-04-18 VITALS — BP 143/82 | HR 75

## 2017-04-18 DIAGNOSIS — R278 Other lack of coordination: Secondary | ICD-10-CM

## 2017-04-18 DIAGNOSIS — R293 Abnormal posture: Secondary | ICD-10-CM

## 2017-04-18 DIAGNOSIS — R4701 Aphasia: Secondary | ICD-10-CM

## 2017-04-18 DIAGNOSIS — R29818 Other symptoms and signs involving the nervous system: Secondary | ICD-10-CM

## 2017-04-18 DIAGNOSIS — R41840 Attention and concentration deficit: Secondary | ICD-10-CM

## 2017-04-18 DIAGNOSIS — R471 Dysarthria and anarthria: Secondary | ICD-10-CM

## 2017-04-18 DIAGNOSIS — R2689 Other abnormalities of gait and mobility: Secondary | ICD-10-CM

## 2017-04-18 DIAGNOSIS — R29898 Other symptoms and signs involving the musculoskeletal system: Secondary | ICD-10-CM

## 2017-04-18 DIAGNOSIS — R41841 Cognitive communication deficit: Secondary | ICD-10-CM

## 2017-04-18 NOTE — Therapy (Signed)
[facility] [address] [place], [place], [address] Phone: [phone]   Fax:  [phone]  Occupational Therapy Treatment  Patient Details  Name: [name] [name] MRN: [id number] Date of Birth: 1951-02-19 Referring Provider: Dr. [name] Tat   Encounter Date: 04/18/2017      OT End of Session - 04/18/17 1514    Visit Number 8   Number of Visits 17   Date for OT Re-Evaluation 05/06/17   Authorization - Visit Number 8   Authorization - Number of Visits 25   OT Start Time 3295   OT Stop Time 1530   OT Time Calculation (min) 39 min   Activity Tolerance Patient tolerated treatment well   Behavior During Therapy [facility] for tasks assessed/performed      Past Medical History:  Diagnosis Date  .  OSA (obstructive sleep apnea) 01/17/2011   npsg 2012:  AHI 67/hr. Auto titration 2012:  Optimal pressure 12cm.   . APPENDECTOMY, HX OF 02/18/2008   Qualifier: Diagnosis of  By: [place], [place]    . Cardiac murmur    as a child  . Depression   . Headache(784.0)   . HEADACHES, HX OF 02/18/2008   Qualifier: Diagnosis of  By: Danny Lawless CMA, [place]    . Parkinson disease ([place]) 11/2014  . Streptococcal meningitis    as an infant    Past Surgical History:  Procedure Laterality Date  . APPENDECTOMY  1967  . NASAL SINUS SURGERY     x 4 as a child  . VASECTOMY      There were no vitals filed for this visit.      Subjective Assessment - 04/18/17 1700    Pertinent History Parkinson's disease, seeing illusions, hx of falls, hx of significant depression, mild cognitive impairment, sleep apnea, hypogonadism, ADD   Limitations orthostatic BP!!! monitor PRN   Patient Stated Goals improved balance and ease with ADLs   Currently in Pain? No/denies          Treatment: arm bike x 6 mins level 1 for conditioning, pt maintained 32-40 rpm Therapist encouraged pt to perform his previously issued stretches prior to dressing, as  pt reports he has not performed at home. Pt's BP was okay today, however therapist encouraged pt to consider the abdominal binder that Dr. Carles Collet had recommended to help with orthostatic BP. Pt repots he had difficulty getting out of bed today.  PWR! rock and twist in supine performed then pt practiced getting out of bed by scooting towards edge and twisting to rollover. Pt demonstrates improved performance.                      OT Short Term Goals - 04/18/17 1518      OT SHORT TERM GOAL #1   Title Pt will be independent with PD specific  HEP.--check STGs 04/05/17   Time 4   Period Weeks   Status On-going  stretches prior to dressing issued, pt has not practiced at home.     OT SHORT TERM GOAL #2   Title Pt will verbalize understanding of ways to prevent future complications and current available/appropriate community resources prn.   Time 4   Period Weeks   Status On-going  initiated, needs futher education     OT SHORT TERM GOAL #3   Title Pt will improve bilateral hand coordination as shown by fastening/unfastening 3 buttons in 50sec or less.   Time 4   Period  Weeks   Status On-going     OT SHORT TERM GOAL #4   Title Pt will report incr ease with donning socks/shoes using adaptive strategies/AE prn.   Time 4   Status On-going  Pt has not attempted stretches at home., Pt demonstrates ability to perform in clinic     OT SHORT TERM GOAL #5   Title Pt will demonstrate ability to write a 3 sentences with 100% legibility and only min decrease in letter size.   Time 4   Period Weeks   Status On-going           OT Long Term Goals - 03/07/17 1745      OT LONG TERM GOAL #1   Title Pt will verbalize understanding of adapted strategies for ADLs/IADLs--check LTGs 05/06/17   Time 8   Period Weeks   Status New     OT LONG TERM GOAL #2   Title Pt will demonstrate improved functional standing balance as evidenced by increasing LUE standing functional reach to 10  inches or greater   Baseline RUE 10 in, LUE 9 in   Time 8   Period Weeks   Status New     OT LONG TERM GOAL #3   Title Pt will improve bilateral hand coordination as shown by fastening/unfastening 3 buttons in 45sec or less.   Baseline 61.60sec   Time 8   Period Weeks   Status New     OT LONG TERM GOAL #4   Title Pt will be able to carry object in both hands when ambulating with direction changes and simple cognitive component without freezing for incr ease/safety with IADLs.   Baseline ----   Time 8   Period Weeks   Status New     OT LONG TERM GOAL #5   Title Pt will improve functional reaching/coordination for ADLs as shown by improving score on box and blocks test by at least 4 blocks bilaterally   Baseline 46 bilaterally   Time 8   Period Weeks   Status New     Long Term Additional Goals   Additional Long Term Goals Yes     OT LONG TERM GOAL #6   Title Pt will improve coordination of dominant R hand for ADLs as shown by improving time on 9-hole peg test by at least 4sec.   Baseline R-32.69sec, L-29.85sec   Time 8   Period Weeks   Status New               Plan - 04/18/17 1523    Clinical Impression Statement Pt denies symptoms he was experiencing yesterday. Pt reports he had not eaten and then after he ate his symptoms subsided.   Rehab Potential Good   Current Impairments/barriers affecting progress: cognitive deficits- Pt is having orthostatic BP, monitor PRN   OT Frequency 2x / week   OT Duration 8 weeks   OT Treatment/Interventions Self-care/ADL training;Therapeutic exercise;Cognitive remediation/compensation;Visual/perceptual remediation/compensation;Neuromuscular education;Parrafin;Moist Heat;Fluidtherapy;Energy conservation;Therapeutic exercises;Patient/family education;Balance training;Therapeutic activities;Passive range of motion;Manual Therapy;DME and/or AE instruction;Ultrasound;Cryotherapy;Functional Mobility Training   Plan check handwriting,  buttoning and 9 hole peg test goals,  dynamic step and reach with big movmentsmonitor BP( Pt has been having orthostatics)   Consulted and Agree with Plan of Care Patient      Patient will benefit from skilled therapeutic intervention in order to improve the following deficits and impairments:  Decreased cognition, Impaired flexibility, Decreased mobility, Decreased coordination, Decreased endurance, Decreased range of motion, Decreased strength, Impaired UE functional  use, Impaired tone, Impaired perceived functional ability, Decreased safety awareness, Difficulty walking, Decreased balance, Decreased activity tolerance, Impaired vision/preception, Improper spinal/pelvic alignment  Visit Diagnosis: Other symptoms and signs involving the nervous system  Abnormal posture  Other symptoms and signs involving the musculoskeletal system  Other lack of coordination  Attention and concentration deficit    Problem List Patient Active Problem List   Diagnosis Date Noted  . Parkinson's disease ([place]) 03/20/2017  . Bilateral lower extremity edema 03/20/2017  . Cerumen impaction 09/13/2016  . Chronic venous insufficiency 09/13/2016  . Fatigue 03/15/2016  . Parkinsonian features 09/15/2015  . ED (erectile dysfunction) 09/15/2015  . MDD (major depressive disorder), recurrent episode, severe ([place]) 02/10/2014  . Nonspecific abnormal electrocardiogram (ECG) (EKG) 02/07/2014  . Non-compliant behavior 02/03/2014  . Morton's neuroma of left foot 08/07/2013  . 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 Hyperglycemia 01/26/2012  . Hypogonadism male 01/24/2012  . Syncope and collapse 01/20/2012  .  OSA (obstructive sleep apnea) 01/17/2011  . CHEST TIGHTNESS 02/18/2008    Pattie Flaharty 04/18/2017, 5:07 PM  [facility] [address] [place], [place], [address] Phone: [phone]   Fax:   [phone]  Name: KENSLEY LARES MRN: [id number] Date of Birth: Oct 18, 1950

## 2017-04-18 NOTE — Therapy (Signed)
[facility] [address] [place], [place], [address] Phone: [phone]   Fax:  [phone]  Speech Language Pathology Treatment  Patient Details  Name: [name] [name] MRN: [id number] Date of Birth: January 12, 1951 Referring Provider: Alonza Bogus, DO  Encounter Date: 04/18/2017      End of Session - 04/18/17 1646    Visit Number 8   Number of Visits 17   Date for SLP Re-Evaluation 06/02/17   Authorization - Visit Number 8   Authorization - Number of Visits 25   SLP Start Time 5361   SLP Stop Time  1616   SLP Time Calculation (min) 43 min   Activity Tolerance Patient tolerated treatment well      Past Medical History:  Diagnosis Date  .  OSA (obstructive sleep apnea) 01/17/2011   npsg 2012:  AHI 67/hr. Auto titration 2012:  Optimal pressure 12cm.   . APPENDECTOMY, HX OF 02/18/2008   Qualifier: Diagnosis of  By: [place], [place]    . Cardiac murmur    as a child  . Depression   . Headache(784.0)   . HEADACHES, HX OF 02/18/2008   Qualifier: Diagnosis of  By: Danny Lawless CMA, [place]    . Parkinson disease ([place]) 11/2014  . Streptococcal meningitis    as an infant    Past Surgical History:  Procedure Laterality Date  . APPENDECTOMY  1967  . NASAL SINUS SURGERY     x 4 as a child  . VASECTOMY      There were no vitals filed for this visit.      Subjective Assessment - 04/18/17 1633    Subjective Pt states he has been more regular at completing loud /a/ at home over the weekend. ("Heyyyyyy - ahhhhh")   Currently in Pain? No/denies               ADULT SLP TREATMENT - 04/18/17 1634      General Information   Behavior/Cognition Alert;Cooperative;Pleasant mood     Treatment Provided   Treatment provided Cognitive-Linquistic     Cognitive-Linquistic Treatment   Treatment focused on Dysarthria   Skilled Treatment Loud /a/  to recalibrate conversational loudness with "[facility]" to  facilitate volume successful at maintaining WNL voice quality 85% of the time. Average /a/ was in the low-90s dB. Multiple-sentence responses (verbal sequences) with average low-70s dB. Simple conversation average of low 70s dB, with rare cues for loudness.      Assessment / Recommendations / Plan   Plan Continue with current plan of care     Progression Toward Goals   Progression toward goals Progressing toward goals            SLP Short Term Goals - 04/18/17 1647      SLP SHORT TERM GOAL #1   Title Pt will average loud /a/ of 88dB over 3 sessions   Status Achieved     SLP SHORT TERM GOAL #2   Title Pt will average low-70s dB  in 10 minutes simple conversation with rare min A over 3 sessions   Baseline 04-10-17, 04-18-17   Time 1   Period Weeks   Status On-going     SLP SHORT TERM GOAL #3   Title pt will demo working memory skills appropriate for recall of 80% of details   Time 1   Period Weeks   Status On-going     SLP SHORT TERM GOAL #4   Title pt will tell SLP three  ways to compensate for verbal expression errors/events over two sessions   Time 1   Period Weeks   Status On-going          SLP Long Term Goals - 04/18/17 1648      SLP LONG TERM GOAL #1   Title pt to maintain average 70dB in 15 minutes mod complex conversation outside [place] room over three sessions   Time 5   Period Weeks   Status On-going     SLP LONG TERM GOAL #2   Title pt will demo aphasia compenastions functionally in 8 minutes mod complex conversation over two sessions   Time 5   Period Weeks   Status On-going     SLP LONG TERM GOAL #3   Title pt will demo working memory skills adequate for functional verbal expression in 15 minutes mod complex conversation over two sessions   Time 5   Period Weeks   Status On-going          Plan - 04/18/17 1646    Clinical Impression Statement Pt cont'd to present with reduced conversational loudness from dysarthria due to Parkinson's disease,  which impedes pt's overall ability to communicate effectively; pt making nice gains in short comversation at this time. Pt would cont to benefit from skilled ST addressing conversational loudness and other areas of language and cogntive-linguistics,.   Speech Therapy Frequency 2x / week   Duration --  8 weeks   Treatment/Interventions Language facilitation;Internal/external aids;Compensatory techniques;SLP instruction and feedback;Multimodal communcation approach;Cognitive reorganization;Functional tasks;Cueing hierarchy;Patient/family education   Potential to Achieve Goals Good   Potential Considerations Severity of impairments      Patient will benefit from skilled therapeutic intervention in order to improve the following deficits and impairments:   Dysarthria and anarthria  Aphasia  Cognitive communication deficit    Problem List Patient Active Problem List   Diagnosis Date Noted  . Parkinson's disease ([place]) 03/20/2017  . Bilateral lower extremity edema 03/20/2017  . Cerumen impaction 09/13/2016  . Chronic venous insufficiency 09/13/2016  . Fatigue 03/15/2016  . Parkinsonian features 09/15/2015  . ED (erectile dysfunction) 09/15/2015  . MDD (major depressive disorder), recurrent episode, severe ([place]) 02/10/2014  . Nonspecific abnormal electrocardiogram (ECG) (EKG) 02/07/2014  . Non-compliant behavior 02/03/2014  . Morton's neuroma of left foot 08/07/2013  . 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 Hyperglycemia 01/26/2012  . Hypogonadism male 01/24/2012  . Syncope and collapse 01/20/2012  .  OSA (obstructive sleep apnea) 01/17/2011  . CHEST TIGHTNESS 02/18/2008    Floride Hutmacher ,MS, CCC-SLP  04/18/2017, 4:49 PM  [facility] [address] [place], [place], [address] Phone: [phone]   Fax:  [phone]   Name: [name] [name] MRN: [id number] Date of Birth:  Nov 21, 1950

## 2017-04-19 NOTE — Therapy (Signed)
[facility] [address] [place], [place], [address] Phone: [phone]   Fax:  [phone]  Physical Therapy Treatment  Patient Details  Name: [name] [name] MRN: [id number] Date of Birth: [birth year] Referring Provider: Alonza Bogus, DO  Encounter Date: 04/18/2017      PT End of Session - 04/19/17 2117    Visit Number 8  New episode of care, eval 03/07/17   Number of Visits 17   Date for PT Re-Evaluation 05/06/17   Authorization Type [employer]-75 visit limit combined   Authorization - Visit Number 8   Authorization - Number of Visits --  75 PT/OT combined   PT Start Time 1404   PT Stop Time 1445   PT Time Calculation (min) 41 min   Activity Tolerance Patient tolerated treatment well   Behavior During Therapy [facility] for tasks assessed/performed      Past Medical History:  Diagnosis Date  .  OSA (obstructive sleep apnea) 01/17/2011   npsg 2012:  AHI 67/hr. Auto titration 2012:  Optimal pressure 12cm.   . APPENDECTOMY, HX OF 02/18/2008   Qualifier: Diagnosis of  By: [place], [place]    . Cardiac murmur    as a child  . Depression   . Headache(784.0)   . HEADACHES, HX OF 02/18/2008   Qualifier: Diagnosis of  By: Danny Lawless CMA, [place]    . Parkinson disease ([place]) 11/2014  . Streptococcal meningitis    as an infant    Past Surgical History:  Procedure Laterality Date  . APPENDECTOMY  1967  . NASAL SINUS SURGERY     x 4 as a child  . VASECTOMY      Vitals:   04/18/17 1435  BP: (!) 143/82  Pulse: 75        Subjective Assessment - 04/18/17 1412    Subjective Feel better today-feel like it was due to taking the new medication (Sinemet, but not eating breakfast)   Patient Stated Goals Pt's goals for therapy are to delay the symptoms of PD.   Currently in Pain? No/denies                         [facility] Adult PT Treatment/Exercise - 04/19/17 0001      Ambulation/Gait    Ambulation/Gait Yes   Ambulation/Gait Assistance 6: Modified independent (Device/Increase time)   Ambulation Distance (Feet) 400 Feet  x 2   Assistive device None   Gait Pattern Step-through pattern;Decreased arm swing - right;Decreased arm swing - left;Decreased step length - right;Decreased step length - left;Decreased trunk rotation;Trunk flexed;Poor foot clearance - left;Poor foot clearance - right   Ambulation Surface Level   Pre-Gait Activities Used floor ladder, one rung in each ladder, x 10 reps, followed by 120 ft of gait x 5 reps, to increased step length and foot clearance.  Forward/back walking activities, 10-20 ft, 5 reps, with cues for widened BOS and increased step length.     High Level Balance   High Level Balance Comments Alternating step taps to 6" step x 10 reps, then at counter, stepping over hurdles, x 8 reps, with cues for increased step length and increased foot clearance.  Squats to pick up hurdles, independently no loss of balance, x 3 reps       Gait with environmental scanning and conversation tasks x 200 ft each, no LOB, decreased arm swing and forward posture.  PT Short Term Goals - 04/10/17 1238      PT SHORT TERM GOAL #1   Title Pt will be independent with HEP to target Parkinson's specific deficits.  TARGET 04/05/17   Baseline Pt able to perform HEP in therapy sessions, but reports not doing HEP at home.   Time 4   Period Weeks   Status Not Met     PT SHORT TERM GOAL #2   Title Pt will improve TUG score to less than or equal to 13.5 seconds for decreased fall risk.   Baseline 11.75 sec 04/10/17   Time 4   Period Weeks   Status Achieved     PT SHORT TERM GOAL #3   Title Pt will improve SLS to at least 3 seconds bilateral lower extremities for improved stair negotiation and obstacle negotiation.   Baseline <2 seconds bilateral   Time 4   Period Weeks   Status Not Met     PT SHORT TERM GOAL #4   Title Pt will perform 8 of 10 reps of  sit<>stand transfers from 18" surfaces and below, with no UE support, no posterior lean, independently.   Time 4   Period Weeks   Status Achieved     PT SHORT TERM GOAL #5   Title Pt will negotiate at least 4 steps, 6 reps, with/without rail, alternating pattern, modified independently for safety with stair negotiation.   Time 4   Period Weeks   Status Achieved           PT Long Term Goals - 04/10/17 1946      PT LONG TERM GOAL #1   Title Pt will verbalize understanding of fall prevention/tips to reduce freezing with gait.  TARGET 05/05/17   Time 8   Period Weeks   Status On-going     PT LONG TERM GOAL #2   Title Pt will improve TUG cognitive to less than or equal to 15 seconds for decreased fall risk/improved dual tasking with gait.   Time 8   Period Weeks   Status On-going     PT LONG TERM GOAL #3   Title Pt will improve MiniBESTest to at least 24/28 for decreased fall risk.   Time 8   Period Weeks   Status On-going     PT LONG TERM GOAL #4   Title Pt will improve gait velocity to at least 2.62 ft/sec for improved gait efficiency and safety.   Time 8   Period Weeks   Status On-going     PT LONG TERM GOAL #5   Title Pt will verbalize plans for ongoing community fitness upon D/C from PT.   Time 8   Period Weeks   Status On-going               Plan - 04/19/17 2118    Clinical Impression Statement Pt feeling better today, no c/o like he had yesterday.  Reports the feeling of dizziness and double vision went away in several hours, and he did not follow-up medically in ED as directed by Dr. Doristine Devoid office.  He is able to participate in all activities for gait and balance today in therapy.  Continues to need cues for posture, arm swing and step length.  Will continue to benefit from skilled PT towards LTGs.   Rehab Potential Good   Clinical Impairments Affecting Rehab Potential  short term memory issues per speech eval   PT Frequency 2x / week   PT Duration 8  weeks  plus eval   PT Treatment/Interventions ADLs/Self Care Home Management;Functional mobility training;Stair training;Gait training;Patient/family education;Neuromuscular re-education;Balance training;Therapeutic exercise;Therapeutic activities   PT Next Visit Plan Continue activities for SLS and step length; stair negotiation while carrying objects   Consulted and Agree with Plan of Care Patient      Patient will benefit from skilled therapeutic intervention in order to improve the following deficits and impairments:  Abnormal gait, Decreased balance, Decreased mobility, Decreased strength, Difficulty walking, Impaired flexibility, Postural dysfunction, Impaired tone  Visit Diagnosis: Other abnormalities of gait and mobility  Other symptoms and signs involving the nervous system  Abnormal posture     Problem List Patient Active Problem List   Diagnosis Date Noted  . Parkinson's disease ([place]) 03/20/2017  . Bilateral lower extremity edema 03/20/2017  . Cerumen impaction 09/13/2016  . Chronic venous insufficiency 09/13/2016  . Fatigue 03/15/2016  . Parkinsonian features 09/15/2015  . ED (erectile dysfunction) 09/15/2015  . MDD (major depressive disorder), recurrent episode, severe ([place]) 02/10/2014  . Nonspecific abnormal electrocardiogram (ECG) (EKG) 02/07/2014  . Non-compliant behavior 02/03/2014  . Morton's neuroma of left foot 08/07/2013  . 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 Hyperglycemia 01/26/2012  . Hypogonadism male 01/24/2012  . Syncope and collapse 01/20/2012  .  OSA (obstructive sleep apnea) 01/17/2011  . CHEST TIGHTNESS 02/18/2008    Quaniya Damas W. 04/19/2017, 9:24 PM  Frazier Butt., PT   [facility] [address] [place], [place], [address] Phone: [phone]   Fax:  [phone]  Name: MANISH RUGGIERO MRN: [id number] Date of Birth: 1951-08-24

## 2017-04-21 ENCOUNTER — Other Ambulatory Visit (HOSPITAL_COMMUNITY): Payer: Self-pay | Admitting: Psychiatry

## 2017-04-21 DIAGNOSIS — F332 Major depressive disorder, recurrent severe without psychotic features: Secondary | ICD-10-CM

## 2017-04-24 ENCOUNTER — Ambulatory Visit: Payer: Federal, State, Local not specified - PPO | Admitting: Physical Therapy

## 2017-04-24 ENCOUNTER — Ambulatory Visit: Payer: Federal, State, Local not specified - PPO | Admitting: Occupational Therapy

## 2017-04-24 DIAGNOSIS — R2689 Other abnormalities of gait and mobility: Secondary | ICD-10-CM

## 2017-04-24 DIAGNOSIS — R2681 Unsteadiness on feet: Secondary | ICD-10-CM

## 2017-04-24 DIAGNOSIS — R293 Abnormal posture: Secondary | ICD-10-CM

## 2017-04-24 DIAGNOSIS — R278 Other lack of coordination: Secondary | ICD-10-CM

## 2017-04-24 DIAGNOSIS — R29818 Other symptoms and signs involving the nervous system: Secondary | ICD-10-CM

## 2017-04-24 DIAGNOSIS — R29898 Other symptoms and signs involving the musculoskeletal system: Secondary | ICD-10-CM

## 2017-04-24 DIAGNOSIS — R41840 Attention and concentration deficit: Secondary | ICD-10-CM

## 2017-04-24 NOTE — Patient Instructions (Addendum)
DEVELOPMENTAL POSITION: Tall Kneeling to Half Kneeling    From tall kneeling position, shift weight to one side. Bring opposite leg forward, place foot flat on surface, with big effort to clear your leg.  You should bring your foot beyond the opposite knee. __5_ reps per set, _1-2__ sets per day.  TO STAND FROM THIS POSITION: -ROCK BACK AND FORTH IN HALF KNEEL POSITION -HOLD ONTO A CHAIR, WALL, OR YOUR RIGHT KNEE, THEN WITH MOMENTUM, LEAN FORWARD AND BRING YOUR LEFT LEG FORWARD AND PLACE YOUR FOOT ON THE GROUND, WITH WIDE BASE OF SUPPORT.  STAND TALL AND MAKE SURE YOU HAVE YOUR BALANCE.  Copyright  VHI. All rights reserved.

## 2017-04-24 NOTE — Therapy (Signed)
[facility] [address] [place], [place], [address] Phone: [phone]   Fax:  [phone]  Physical Therapy Treatment  Patient Details  Name: [name] [name] MRN: [id number] Date of Birth: [birth year] Referring Provider: Alonza Bogus, DO  Encounter Date: 04/24/2017      PT End of Session - 04/24/17 1344    Visit Number 9  New episode of care, eval 03/07/17   Number of Visits 17   Date for PT Re-Evaluation 05/06/17   Authorization Type [employer]-75 visit limit combined   Authorization - Visit Number 9   Authorization - Number of Visits --  75 PT/OT combined   PT Start Time 0935   PT Stop Time 1016   PT Time Calculation (min) 41 min   Activity Tolerance Patient tolerated treatment well   Behavior During Therapy [facility] for tasks assessed/performed      Past Medical History:  Diagnosis Date  .  OSA (obstructive sleep apnea) 01/17/2011   npsg 2012:  AHI 67/hr. Auto titration 2012:  Optimal pressure 12cm.   . APPENDECTOMY, HX OF 02/18/2008   Qualifier: Diagnosis of  By: [place], [place]    . Cardiac murmur    as a child  . Depression   . Headache(784.0)   . HEADACHES, HX OF 02/18/2008   Qualifier: Diagnosis of  By: Danny Lawless CMA, [place]    . Parkinson disease ([place]) 11/2014  . Streptococcal meningitis    as an infant    Past Surgical History:  Procedure Laterality Date  . APPENDECTOMY  1967  . NASAL SINUS SURGERY     x 4 as a child  . VASECTOMY      There were no vitals filed for this visit.      Subjective Assessment - 04/24/17 0937    Subjective Feel like I'm moving slow motion every day.  Seems a little more since beginning taking that medication.  I really want to know how to get up from the floor without having to use my hands.   Patient Stated Goals Pt's goals for therapy are to delay the symptoms of PD.   Currently in Pain? No/denies                         [facility] Adult PT  Treatment/Exercise - 04/24/17 0001      Transfers   Transfers Floor to Transfer   Floor to Transfer 6: Modified independent (Device/Increase time);5: Supervision  Floor to stand   Floor to Transfer Details (indicate cue type and reason) Practiced multiple reps of floor>stand transfer, with light UE support of therapist x 3 reps, with minimal UE support at chair x 5 reps, then with UE supported at R knee in half-kneel up to stand.  Cues provided for use of rocking for increased momentum, wide BOS upon standing.  When patient not using external UE support, pt is more hesitant with standing, but able to perform with supervision.     Comments Tall kneeling>1/2 kneel with UE support x 5 reps each leg.  Then  practiced tall kneel>1/2 kneel, using RLE, as he would prefer to stand, with cues provided for optimal foot clearance and large step to place R foot in line with or ahead of R knee for optimal positioning in preparation to stand.     Ambulation/Gait   Stairs Yes   Stairs Assistance 6: Modified independent (Device/Increase time)   Stairs Assistance Details (indicate cue type and  reason) Stair negotiation no rail, step through pattern, x 5 reps.  Cues provided for foot clearance and foot placement.  Progressed to carrying object in 1 hand, with use of rail in other, step through pattern, x 5 reps.  (pt states he currently leans against wall in this situation at home, and PT recommends instead to lightly hold to rail).  Progressed to stair training carrying 4# weighted box, with bilateral hands, step-to pattern, x 3 reps.                PT Education - 04/24/17 1343    Education provided Yes   Education Details Floor>stand transfer work, per patient request, addition of tall knee>half kneeling exercise to HEP.   Person(s) Educated Patient   Methods Explanation;Demonstration;Handout   Comprehension Verbalized understanding;Returned demonstration;Verbal cues required          PT Short  Term Goals - 04/10/17 1238      PT SHORT TERM GOAL #1   Title Pt will be independent with HEP to target Parkinson's specific deficits.  TARGET 04/05/17   Baseline Pt able to perform HEP in therapy sessions, but reports not doing HEP at home.   Time 4   Period Weeks   Status Not Met     PT SHORT TERM GOAL #2   Title Pt will improve TUG score to less than or equal to 13.5 seconds for decreased fall risk.   Baseline 11.75 sec 04/10/17   Time 4   Period Weeks   Status Achieved     PT SHORT TERM GOAL #3   Title Pt will improve SLS to at least 3 seconds bilateral lower extremities for improved stair negotiation and obstacle negotiation.   Baseline <2 seconds bilateral   Time 4   Period Weeks   Status Not Met     PT SHORT TERM GOAL #4   Title Pt will perform 8 of 10 reps of sit<>stand transfers from 18" surfaces and below, with no UE support, no posterior lean, independently.   Time 4   Period Weeks   Status Achieved     PT SHORT TERM GOAL #5   Title Pt will negotiate at least 4 steps, 6 reps, with/without rail, alternating pattern, modified independently for safety with stair negotiation.   Time 4   Period Weeks   Status Achieved           PT Long Term Goals - 04/10/17 1946      PT LONG TERM GOAL #1   Title Pt will verbalize understanding of fall prevention/tips to reduce freezing with gait.  TARGET 05/05/17   Time 8   Period Weeks   Status On-going     PT LONG TERM GOAL #2   Title Pt will improve TUG cognitive to less than or equal to 15 seconds for decreased fall risk/improved dual tasking with gait.   Time 8   Period Weeks   Status On-going     PT LONG TERM GOAL #3   Title Pt will improve MiniBESTest to at least 24/28 for decreased fall risk.   Time 8   Period Weeks   Status On-going     PT LONG TERM GOAL #4   Title Pt will improve gait velocity to at least 2.62 ft/sec for improved gait efficiency and safety.   Time 8   Period Weeks   Status On-going     PT  LONG TERM GOAL #5   Title Pt will verbalize plans for ongoing  community fitness upon D/C from PT.   Time 8   Period Weeks   Status On-going               Plan - 04/24/17 1345    Clinical Impression Statement Per patient report-worked on floor>stand transfer this visit, with pt demonstrating improved confidence and improved ability to perform throughout practice in session.  Also worked on progression of stair training, including step negotiation carrying object with bilateral hands.  Recommend pt use step-to pattern when carrying items with both UEs, and to try as much as possible to carry items in one hand and use rail in other hand.   Rehab Potential Good   Clinical Impairments Affecting Rehab Potential  short term memory issues per speech eval   PT Frequency 2x / week   PT Duration 8 weeks  plus eval   PT Treatment/Interventions ADLs/Self Care Home Management;Functional mobility training;Stair training;Gait training;Patient/family education;Neuromuscular re-education;Balance training;Therapeutic exercise;Therapeutic activities   PT Next Visit [place] next visit; review floor>stand and stair negotiation; look at LTGs/discharge next week (wk 8 of 8)   Consulted and Agree with Plan of Care Patient      Patient will benefit from skilled therapeutic intervention in order to improve the following deficits and impairments:  Abnormal gait, Decreased balance, Decreased mobility, Decreased strength, Difficulty walking, Impaired flexibility, Postural dysfunction, Impaired tone  Visit Diagnosis: Unsteadiness on feet  Other abnormalities of gait and mobility     Problem List Patient Active Problem List   Diagnosis Date Noted  . Parkinson's disease ([place]) 03/20/2017  . Bilateral lower extremity edema 03/20/2017  . Cerumen impaction 09/13/2016  . Chronic venous insufficiency 09/13/2016  . Fatigue 03/15/2016  . Parkinsonian features 09/15/2015  . ED (erectile dysfunction)  09/15/2015  . MDD (major depressive disorder), recurrent episode, severe ([place]) 02/10/2014  . Nonspecific abnormal electrocardiogram (ECG) (EKG) 02/07/2014  . Non-compliant behavior 02/03/2014  . Morton's neuroma of left foot 08/07/2013  . 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 Hyperglycemia 01/26/2012  . Hypogonadism male 01/24/2012  . Syncope and collapse 01/20/2012  .  OSA (obstructive sleep apnea) 01/17/2011  . CHEST TIGHTNESS 02/18/2008    Lacosta Hargan W. 04/24/2017, 1:48 PM  Frazier Butt., PT   [facility] [address] [place], [place], [address] Phone: [phone]   Fax:  [phone]  Name: NORMAND DAMRON MRN: [id number] Date of Birth: 1951/09/15

## 2017-04-24 NOTE — Therapy (Signed)
[facility] [address] [place], [place], [address] Phone: [phone]   Fax:  [phone]  Occupational Therapy Treatment  Patient Details  Name: [name] [name] MRN: [id number] Date of Birth: 1951-10-02 Referring Provider: Dr. [name] Tat   Encounter Date: 04/24/2017      OT End of Session - 04/24/17 1024    Visit Number 9   Number of Visits 17   Date for OT Re-Evaluation 05/06/17   Authorization Type BCBS   Authorization Time Period 75 visit limit combined    Authorization - Visit Number 9   Authorization - Number of Visits 25   OT Start Time 1022   OT Stop Time 1100   OT Time Calculation (min) 38 min   Activity Tolerance Patient tolerated treatment well   Behavior During Therapy WFL for tasks assessed/performed      Past Medical History:  Diagnosis Date  .  OSA (obstructive sleep apnea) 01/17/2011   npsg 2012:  AHI 67/hr. Auto titration 2012:  Optimal pressure 12cm.   . APPENDECTOMY, HX OF 02/18/2008   Qualifier: Diagnosis of  By: [place], [place]    . Cardiac murmur    as a child  . Depression   . Headache(784.0)   . HEADACHES, HX OF 02/18/2008   Qualifier: Diagnosis of  By: Danny Lawless CMA, [place]    . Parkinson disease ([place]) 11/2014  . Streptococcal meningitis    as an infant    Past Surgical History:  Procedure Laterality Date  . APPENDECTOMY  1967  . NASAL SINUS SURGERY     x 4 as a child  . VASECTOMY      There were no vitals filed for this visit.      Subjective Assessment - 04/24/17 1022    Subjective  pt denies anymore double vision    Pertinent History Parkinson's disease, seeing illusions, hx of falls, hx of significant depression, mild cognitive impairment, sleep apnea, hypogonadism, ADD   Limitations orthostatic BP!!! monitor PRN   Patient Stated Goals improved balance and ease with ADLs   Currently in Pain? No/denies       Reviewed card activities from coordination HEP  (dealing with thumb, flipping with opening hand/supination, and sliding off table with PWR! Hands).  Pt performed with each hand with min cueing for large amplitude, deliberate movements.  Checked remaining STGs and discussed progress-- see goals section.  Also checked 9-hole peg test--see LTGs.  Emphasized importance of performing HEP at home.  Pt continues to slowly incr Sinemet and reports improved tolerance.  Writing 5 sentences with no significant decr in size and 100% legibility.  Fastening/unfastening buttons with review of strategies and min-mod cues.  With use of strategies/cueing, pt demo improved performance.                              OT Short Term Goals - 04/24/17 1045      OT SHORT TERM GOAL #1   Title Pt will be independent with PD specific  HEP.--check STGs 04/05/17   Time 4   Period Weeks   Status On-going  stretches prior to dressing issued, pt has not practiced at home.     OT SHORT TERM GOAL #2   Title Pt will verbalize understanding of ways to prevent future complications and current available/appropriate community resources prn.   Time 4   Period Weeks   Status On-going  initiated, needs futher  education     OT SHORT TERM GOAL #3   Title Pt will improve bilateral hand coordination as shown by fastening/unfastening 3 buttons in 50sec or less.   Time 4   Period Weeks   Status Achieved  04/24/17:  49.22sec with min cueing for use of strategies     OT SHORT TERM GOAL #4   Title Pt will report incr ease with donning socks/shoes using adaptive strategies/AE prn.   Time 4   Status On-going  Pt has not attempted stretches at home., Pt demonstrates ability to perform in clinic     OT SHORT TERM GOAL #5   Title Pt will demonstrate ability to write a 3 sentences with 100% legibility and only min decrease in letter size.   Time 4   Period Weeks   Status Achieved  04/24/17:  100% legibilty, no significant decr in size.           OT  Long Term Goals - 04/24/17 1046      OT LONG TERM GOAL #1   Title Pt will verbalize understanding of adapted strategies for ADLs/IADLs--check LTGs 05/06/17   Time 8   Period Weeks   Status New     OT LONG TERM GOAL #2   Title Pt will demonstrate improved functional standing balance as evidenced by increasing LUE standing functional reach to 10 inches or greater   Baseline RUE 10 in, LUE 9 in   Time 8   Period Weeks   Status New     OT LONG TERM GOAL #3   Title Pt will improve bilateral hand coordination as shown by fastening/unfastening 3 buttons in 45sec or less.   Baseline 61.60sec   Time 8   Period Weeks   Status New     OT LONG TERM GOAL #4   Title Pt will be able to carry object in both hands when ambulating with direction changes and simple cognitive component without freezing for incr ease/safety with IADLs.   Baseline ----   Time 8   Period Weeks   Status New     OT LONG TERM GOAL #5   Title Pt will improve functional reaching/coordination for ADLs as shown by improving score on box and blocks test by at least 4 blocks bilaterally   Baseline 46 bilaterally   Time 8   Period Weeks   Status New     OT LONG TERM GOAL #6   Title Pt will improve coordination of dominant R hand for ADLs as shown by improving time on 9-hole peg test by at least 4sec.   Baseline R-32.69sec, L-29.85sec   Time 8   Period Weeks   Status On-going  04/24/17:  32.66sec, 31.75sec               Plan - 04/24/17 1025    Clinical Impression Statement Pt demo improved movement for function tasks with use of large amplitude movement strategies, but is not performing HEP at home which may contribute to slowed progress.   Rehab Potential Good   Current Impairments/barriers affecting progress: cognitive deficits- Pt is having orthostatic BP, monitor PRN   OT Frequency 2x / week   OT Duration 8 weeks   OT Treatment/Interventions Self-care/ADL training;Therapeutic exercise;Cognitive  remediation/compensation;Visual/perceptual remediation/compensation;Neuromuscular education;Parrafin;Moist Heat;Fluidtherapy;Energy conservation;Therapeutic exercises;Patient/family education;Balance training;Therapeutic activities;Passive range of motion;Manual Therapy;DME and/or AE instruction;Ultrasound;Cryotherapy;Functional Mobility Training   Plan monitor BP prn (orthostatic HTN), dynamic step and reach with big movements, review/re-issue coordination HEP (simplified:  cards, coins, ball);  Following session:  work with PT to establish PD Ex chart incorporating community exercise   OT Home Exercise Plan Education provided:  stretches prior to dressing, strategies for buttoning   Consulted and Agree with Plan of Care Patient      Patient will benefit from skilled therapeutic intervention in order to improve the following deficits and impairments:  Decreased cognition, Impaired flexibility, Decreased mobility, Decreased coordination, Decreased endurance, Decreased range of motion, Decreased strength, Impaired UE functional use, Impaired tone, Impaired perceived functional ability, Decreased safety awareness, Difficulty walking, Decreased balance, Decreased activity tolerance, Impaired vision/preception, Improper spinal/pelvic alignment  Visit Diagnosis: Other symptoms and signs involving the nervous system  Abnormal posture  Other symptoms and signs involving the musculoskeletal system  Other lack of coordination  Attention and concentration deficit  Other abnormalities of gait and mobility  Unsteadiness on feet    Problem List Patient Active Problem List   Diagnosis Date Noted  . Parkinson's disease ([place]) 03/20/2017  . Bilateral lower extremity edema 03/20/2017  . Cerumen impaction 09/13/2016  . Chronic venous insufficiency 09/13/2016  . Fatigue 03/15/2016  . Parkinsonian features 09/15/2015  . ED (erectile dysfunction) 09/15/2015  . MDD (major depressive disorder),  recurrent episode, severe ([place]) 02/10/2014  . Nonspecific abnormal electrocardiogram (ECG) (EKG) 02/07/2014  . Non-compliant behavior 02/03/2014  . Morton's neuroma of left foot 08/07/2013  . 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 Hyperglycemia 01/26/2012  . Hypogonadism male 01/24/2012  . Syncope and collapse 01/20/2012  .  OSA (obstructive sleep apnea) 01/17/2011  . CHEST TIGHTNESS 02/18/2008    [facility] 04/24/2017, 11:49 AM  [facility] [address] [place], [place], [address] Phone: [phone]   Fax:  [phone]  Name: [name] [name] MRN: [id number] Date of Birth: [birth year]   Vianne Bulls, OTR/L [facility] [address] [place], [place]  [address] [phone] phone [phone] 04/24/17 11:49 AM

## 2017-04-28 ENCOUNTER — Ambulatory Visit: Payer: Federal, State, Local not specified - PPO

## 2017-04-28 ENCOUNTER — Ambulatory Visit: Payer: Federal, State, Local not specified - PPO | Admitting: Occupational Therapy

## 2017-04-28 ENCOUNTER — Ambulatory Visit: Payer: Federal, State, Local not specified - PPO | Admitting: Physical Therapy

## 2017-04-28 DIAGNOSIS — R29818 Other symptoms and signs involving the nervous system: Secondary | ICD-10-CM

## 2017-04-28 DIAGNOSIS — R471 Dysarthria and anarthria: Secondary | ICD-10-CM

## 2017-04-28 DIAGNOSIS — R4701 Aphasia: Secondary | ICD-10-CM

## 2017-04-28 DIAGNOSIS — R2689 Other abnormalities of gait and mobility: Secondary | ICD-10-CM

## 2017-04-28 DIAGNOSIS — R293 Abnormal posture: Secondary | ICD-10-CM | POA: Diagnosis not present

## 2017-04-28 DIAGNOSIS — R2681 Unsteadiness on feet: Secondary | ICD-10-CM

## 2017-04-28 DIAGNOSIS — R29898 Other symptoms and signs involving the musculoskeletal system: Secondary | ICD-10-CM

## 2017-04-28 DIAGNOSIS — R41841 Cognitive communication deficit: Secondary | ICD-10-CM

## 2017-04-28 DIAGNOSIS — R278 Other lack of coordination: Secondary | ICD-10-CM

## 2017-04-28 NOTE — Therapy (Signed)
[facility] [address] [place], [place], [address] Phone: [phone]   Fax:  [phone]  Speech Language Pathology Treatment  Patient Details  Name: [name] [name] MRN: [id number] Date of Birth: 10/21/1950 Referring Provider: Alonza Bogus, DO  Encounter Date: 04/28/2017      End of Session - 04/28/17 1005    Visit Number 9   Number of Visits 17   Date for SLP Re-Evaluation 06/02/17   Authorization Type 75 total visits   Authorization - Visit Number 9   Authorization - Number of Visits 25   SLP Start Time 0850   SLP Stop Time  0932   SLP Time Calculation (min) 42 min   Activity Tolerance Patient tolerated treatment well      Past Medical History:  Diagnosis Date  .  OSA (obstructive sleep apnea) 01/17/2011   npsg 2012:  AHI 67/hr. Auto titration 2012:  Optimal pressure 12cm.   . APPENDECTOMY, HX OF 02/18/2008   Qualifier: Diagnosis of  By: [place], [place]    . Cardiac murmur    as a child  . Depression   . Headache(784.0)   . HEADACHES, HX OF 02/18/2008   Qualifier: Diagnosis of  By: Danny Lawless CMA, [place]    . Parkinson disease ([place]) 11/2014  . Streptococcal meningitis    as an infant    Past Surgical History:  Procedure Laterality Date  . APPENDECTOMY  1967  . NASAL SINUS SURGERY     x 4 as a child  . VASECTOMY      There were no vitals filed for this visit.      Subjective Assessment - 04/28/17 0905    Subjective SLP encouraged pt to ask wife to attend a set of PT, OT and ST sessions in the next 2 weeks.   Currently in Pain? No/denies               ADULT SLP TREATMENT - 04/28/17 0907      General Information   Behavior/Cognition Alert;Cooperative;Pleasant mood     Treatment Provided   Treatment provided Cognitive-Linquistic     Cognitive-Linquistic Treatment   Treatment focused on Dysarthria   Skilled Treatment Loud /a/  to recalibrate conversational loudness with  "[facility]" to facilitate conversational volume. Average /a/ was in the low-90s dB. Multiple-sentence responses (similarities/differences) with average low-70s dB. Simple-mod complex conversation averaged in the low 70s dB, with rare cues for loudness and occasional SLP cues for feeling and hearing louder speech.      Assessment / Recommendations / Plan   Plan Continue with current plan of care     Progression Toward Goals   Progression toward goals Progressing toward goals            SLP Short Term Goals - 04/28/17 1035      SLP SHORT TERM GOAL #1   Title (P)  Pt will average loud /a/ of 88dB over 3 sessions   Status (P)  Achieved     SLP SHORT TERM GOAL #2   Title (P)  Pt will average low-70s dB  in 10 minutes simple conversation with rare min A over 3 sessions   Status (P)  Achieved     SLP SHORT TERM GOAL #3   Title (P)  pt will demo working memory skills appropriate for recall of 80% of details   Time (P)  1   Period (P)  Weeks   Status (P)  Deferred  work on  speech skills     SLP SHORT TERM GOAL #4   Title (P)  pt will tell SLP three ways to compensate for verbal expression errors/events over two sessions   Status (P)  Deferred          SLP Long Term Goals - 04/18/17 1648      SLP LONG TERM GOAL #1   Title pt to maintain average 70dB in 15 minutes mod complex conversation outside [place] room over three sessions   Time 5   Period Weeks   Status On-going     SLP LONG TERM GOAL #2   Title pt will demo aphasia compenastions functionally in 8 minutes mod complex conversation over two sessions   Time 5   Period Weeks   Status On-going     SLP LONG TERM GOAL #3   Title pt will demo working memory skills adequate for functional verbal expression in 15 minutes mod complex conversation over two sessions   Time 5   Period Weeks   Status On-going          Plan - 04/28/17 1005    Clinical Impression Statement (P)  Pt is improving his conversational loudness in  short simple conversation (10 minutes in length). Pt would cont to benefit from skilled ST addressing conversational loudness and other areas of language and cogntive-linguistics,.   Speech Therapy Frequency (P)  2x / week   Duration (P)  --  8 weeks   Treatment/Interventions (P)  Language facilitation;Internal/external aids;Compensatory techniques;SLP instruction and feedback;Multimodal communcation approach;Cognitive reorganization;Functional tasks;Cueing hierarchy;Patient/family education   Potential to Achieve Goals (P)  Good   Potential Considerations (P)  Severity of impairments      Patient will benefit from skilled therapeutic intervention in order to improve the following deficits and impairments:   Dysarthria and anarthria  Cognitive communication deficit  Aphasia    Problem List Patient Active Problem List   Diagnosis Date Noted  . Parkinson's disease ([place]) 03/20/2017  . Bilateral lower extremity edema 03/20/2017  . Cerumen impaction 09/13/2016  . Chronic venous insufficiency 09/13/2016  . Fatigue 03/15/2016  . Parkinsonian features 09/15/2015  . ED (erectile dysfunction) 09/15/2015  . MDD (major depressive disorder), recurrent episode, severe ([place]) 02/10/2014  . Nonspecific abnormal electrocardiogram (ECG) (EKG) 02/07/2014  . Non-compliant behavior 02/03/2014  . Morton's neuroma of left foot 08/07/2013  . 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 Hyperglycemia 01/26/2012  . Hypogonadism male 01/24/2012  . Syncope and collapse 01/20/2012  .  OSA (obstructive sleep apnea) 01/17/2011  . CHEST TIGHTNESS 02/18/2008    [place] ,MS, CCC-SLP  04/28/2017, 11:00 AM  [facility] [address] [place], [place], [address] Phone: [phone]   Fax:  [phone]   Name: [name] [name] MRN: [id number] Date of Birth: 1951-07-22

## 2017-04-28 NOTE — Patient Instructions (Signed)
Make sure to practice your loud /a/ TWICE every day!

## 2017-04-28 NOTE — Therapy (Signed)
[facility] [address] [place], [place], [address] Phone: [phone]   Fax:  [phone]  Occupational Therapy Treatment  Patient Details  Name: [name] [name] MRN: [id number] Date of Birth: [birth year] Referring Provider: Dr. [name] Tat   Encounter Date: 04/28/2017      OT End of Session - 04/28/17 1305    Visit Number 10   Number of Visits 17   Date for OT Re-Evaluation 05/06/17   Authorization Type BC[employer]   Authorization Time Period 75 visit limit combined    Authorization - Visit Number 10   Authorization - Number of Visits 25   OT Start Time 6803   OT Stop Time 1100   OT Time Calculation (min) 45 min   Activity Tolerance Patient tolerated treatment well      Past Medical History:  Diagnosis Date  .  OSA (obstructive sleep apnea) 01/17/2011   npsg 2012:  AHI 67/hr. Auto titration 2012:  Optimal pressure 12cm.   . APPENDECTOMY, HX OF 02/18/2008   Qualifier: Diagnosis of  By: [place], [place]    . Cardiac murmur    as a child  . Depression   . Headache(784.0)   . HEADACHES, HX OF 02/18/2008   Qualifier: Diagnosis of  By: Danny Lawless CMA, [place]    . Parkinson disease ([place]) 11/2014  . Streptococcal meningitis    as an infant    Past Surgical History:  Procedure Laterality Date  . APPENDECTOMY  1967  . NASAL SINUS SURGERY     x 4 as a child  . VASECTOMY      There were no vitals filed for this visit.      Subjective Assessment - 04/28/17 1022    Subjective  I do A.C.T. Wednesdays and Fridays, and boxing Monday, Tuesday, and Thursdays   Pertinent History Parkinson's disease, seeing illusions, hx of falls, hx of significant depression, mild cognitive impairment, sleep apnea, hypogonadism, ADD   Limitations orthostatic BP!!! monitor PRN   Patient Stated Goals improved balance and ease with ADLs   Currently in Pain? No/denies               BP upon standing: 126/79       OT  Treatments/Exercises (OP) - 04/28/17 0001      ADLs   ADL Comments Briefly discussed when pt does community fitness classes in prep for making Ex chart with greatest carryover. Pt reports he does classes M-F but doesn't do PWR! Moves those days. Discussed with primary P.T. ways to implement PWR! moves into HEP for greatest carryover. Also discussed proper technique with boxing including full elbow extension and trunk rotation with each punch. Therapist demo and pt verbalized understanding.      Fine Motor Coordination   Other Fine Motor Exercises Re-issued coordination HEP with modifications to keep fairly simple for greatest carryover. Pt return demo of each with min cueing to maintain large amplitude movements.      Neurological Re-education Exercises   Other Exercises 1 Standing: to perform dynamic step and reach tasks with big movements to both sides. Pt required mod cueing to keep steps big. However pt doing well with supination and full finger extension during task   Other Exercises 2 Standing with back against wall for support as needed with widened BOS while performing trunk rotation, full elbow and wrist extension for cross reaching bilaterally x 20 reps to hit wall  OT Education - 04/28/17 1307    Education provided Yes   Education Details simplified coordination HEP, proper technique with boxing   Person(s) Educated Patient   Methods Explanation;Demonstration;Handout   Comprehension Verbalized understanding;Returned demonstration          OT Short Term Goals - 04/24/17 1045      OT SHORT TERM GOAL #1   Title Pt will be independent with PD specific  HEP.--check STGs 04/05/17   Time 4   Period Weeks   Status On-going  stretches prior to dressing issued, pt has not practiced at home.     OT SHORT TERM GOAL #2   Title Pt will verbalize understanding of ways to prevent future complications and current available/appropriate community resources prn.   Time  4   Period Weeks   Status On-going  initiated, needs futher education     OT SHORT TERM GOAL #3   Title Pt will improve bilateral hand coordination as shown by fastening/unfastening 3 buttons in 50sec or less.   Time 4   Period Weeks   Status Achieved  04/24/17:  49.22sec with min cueing for use of strategies     OT SHORT TERM GOAL #4   Title Pt will report incr ease with donning socks/shoes using adaptive strategies/AE prn.   Time 4   Status On-going  Pt has not attempted stretches at home., Pt demonstrates ability to perform in clinic     OT SHORT TERM GOAL #5   Title Pt will demonstrate ability to write a 3 sentences with 100% legibility and only min decrease in letter size.   Time 4   Period Weeks   Status Achieved  04/24/17:  100% legibilty, no significant decr in size.           OT Long Term Goals - 04/24/17 1046      OT LONG TERM GOAL #1   Title Pt will verbalize understanding of adapted strategies for ADLs/IADLs--check LTGs 05/06/17   Time 8   Period Weeks   Status New     OT LONG TERM GOAL #2   Title Pt will demonstrate improved functional standing balance as evidenced by increasing LUE standing functional reach to 10 inches or greater   Baseline RUE 10 in, LUE 9 in   Time 8   Period Weeks   Status New     OT LONG TERM GOAL #3   Title Pt will improve bilateral hand coordination as shown by fastening/unfastening 3 buttons in 45sec or less.   Baseline 61.60sec   Time 8   Period Weeks   Status New     OT LONG TERM GOAL #4   Title Pt will be able to carry object in both hands when ambulating with direction changes and simple cognitive component without freezing for incr ease/safety with IADLs.   Baseline ----   Time 8   Period Weeks   Status New     OT LONG TERM GOAL #5   Title Pt will improve functional reaching/coordination for ADLs as shown by improving score on box and blocks test by at least 4 blocks bilaterally   Baseline 46 bilaterally   Time 8    Period Weeks   Status New     OT LONG TERM GOAL #6   Title Pt will improve coordination of dominant R hand for ADLs as shown by improving time on 9-hole peg test by at least 4sec.   Baseline R-32.69sec, L-29.85sec   Time 8  Period Weeks   Status On-going  04/24/17:  32.66sec, 31.75sec               Plan - 04/28/17 1307    Clinical Impression Statement Pt with decr. carryover of PWR! moves at home. Pt requires min cueing to maintain large amplitude movement during therapeutic tasks.    Rehab Potential Good   Current Impairments/barriers affecting progress: cognitive deficits- Pt is having orthostatic BP, monitor PRN   OT Frequency 2x / week   OT Duration 8 weeks   OT Treatment/Interventions Self-care/ADL training;Therapeutic exercise;Cognitive remediation/compensation;Visual/perceptual remediation/compensation;Neuromuscular education;Parrafin;Moist Heat;Fluidtherapy;Energy conservation;Therapeutic exercises;Patient/family education;Balance training;Therapeutic activities;Passive range of motion;Manual Therapy;DME and/or AE instruction;Ultrasound;Cryotherapy;Functional Mobility Training   Plan Establish PD Ex chart incoporating community ex, review stretching ex's for dressing, if time allows - practice boxing and re-iterate proper technique   OT Home Exercise Plan Education provided:  stretches prior to dressing, strategies for buttoning      Patient will benefit from skilled therapeutic intervention in order to improve the following deficits and impairments:  Decreased cognition, Impaired flexibility, Decreased mobility, Decreased coordination, Decreased endurance, Decreased range of motion, Decreased strength, Impaired UE functional use, Impaired tone, Impaired perceived functional ability, Decreased safety awareness, Difficulty walking, Decreased balance, Decreased activity tolerance, Impaired vision/preception, Improper spinal/pelvic alignment  Visit Diagnosis: Other symptoms  and signs involving the nervous system  Abnormal posture  Other symptoms and signs involving the musculoskeletal system  Other lack of coordination  Unsteadiness on feet    Problem List Patient Active Problem List   Diagnosis Date Noted  . Parkinson's disease ([place]) 03/20/2017  . Bilateral lower extremity edema 03/20/2017  . Cerumen impaction 09/13/2016  . Chronic venous insufficiency 09/13/2016  . Fatigue 03/15/2016  . Parkinsonian features 09/15/2015  . ED (erectile dysfunction) 09/15/2015  . MDD (major depressive disorder), recurrent episode, severe ([place]) 02/10/2014  . Nonspecific abnormal electrocardiogram (ECG) (EKG) 02/07/2014  . Non-compliant behavior 02/03/2014  . Morton's neuroma of left foot 08/07/2013  . 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 Hyperglycemia 01/26/2012  . Hypogonadism male 01/24/2012  . Syncope and collapse 01/20/2012  .  OSA (obstructive sleep apnea) 01/17/2011  . CHEST TIGHTNESS 02/18/2008    Carey Bullocks, OTR/L 04/28/2017, 1:13 PM  [facility] [address] [place], [place], [address] Phone: [phone]   Fax:  [phone]  Name: MYERS TUTTEROW MRN: [id number] Date of Birth: [birth year]

## 2017-04-28 NOTE — Patient Instructions (Signed)
Coordination Exercises  Perform the following exercises for 15 minutes 1 times per day, M/W/F. Perform with both hand(s). Perform using big movements.   Flipping Cards: Place deck of cards on the table. Flip cards over by opening your hand big to grasp and then turn your palm up big.    Deal cards: Hold 1/2 or whole deck in your hand. Use thumb to push card off top of deck with one big push.    Rotate ball with fingertips: Pick up with fingers/thumb and move as much as you can with each turn/movement (clockwise and counter-clockwise).    Toss ball in the air and catch with the same hand: Toss big/high.    Pick up 5-10 coins one at a time and hold in palm. Then, move coins from palm to fingertips one at a time to stack.    Practice writing: Slow down, write big, and focus on forming each letter.    Perform "Flicks"/hand stretches (PWR! Hands): Close hands then flick out your fingers with focus on opening hands, pulling wrists back, and extending elbows like you are pushing.

## 2017-04-28 NOTE — Therapy (Addendum)
[facility] [address] [place], [place], [address] Phone: [phone]   Fax:  [phone]  Physical Therapy Treatment  Patient Details  Name: [name] [name] MRN: [id number] Date of Birth: 14-Jul-1951 Referring Provider: Alonza Bogus, DO  Encounter Date: 04/28/2017      PT End of Session - 04/28/17 2038    Visit Number 10  New episode of care, eval 03/07/17   Number of Visits 17   Date for PT Re-Evaluation 05/06/17   Authorization Type [employer]-75 visit limit combined   Authorization - Visit Number 10   Authorization - Number of Visits --  75 PT/OT combined   PT Start Time 0939  comes late from speech therapy   PT Stop Time 1017   PT Time Calculation (min) 38 min   Activity Tolerance Patient tolerated treatment well   Behavior During Therapy [facility] for tasks assessed/performed      Past Medical History:  Diagnosis Date  .  OSA (obstructive sleep apnea) 01/17/2011   npsg 2012:  AHI 67/hr. Auto titration 2012:  Optimal pressure 12cm.   . APPENDECTOMY, HX OF 02/18/2008   Qualifier: Diagnosis of  By: [place], [place]    . Cardiac murmur    as a child  . Depression   . Headache(784.0)   . HEADACHES, HX OF 02/18/2008   Qualifier: Diagnosis of  By: Danny Lawless CMA, [place]    . Parkinson disease ([place]) 11/2014  . Streptococcal meningitis    as an infant    Past Surgical History:  Procedure Laterality Date  . APPENDECTOMY  1967  . NASAL SINUS SURGERY     x 4 as a child  . VASECTOMY      There were no vitals filed for this visit.      Subjective Assessment - 04/28/17 0941    Subjective Nothing new since last visit.  Feel that the boxing class has been helping.   Patient Stated Goals Pt's goals for therapy are to delay the symptoms of PD.   Currently in Pain? No/denies                         [facility] Adult PT Treatment/Exercise - 04/28/17 0001      Transfers   Transfers Floor to  Transfer   Floor to Transfer 6: Modified independent (Device/Increase time);5: Supervision  with RLE in half kneel; with LLE in half kneel, needs min A   Floor to Transfer Details (indicate cue type and reason) Practiced multiple reps of floor>stand, breaking it down to PWR! Up from quadruped to tall kneel, then stand from tall>half kneel.  Pt insistent on getting up from floor without external assistance, but has significant difficulty when trying to go half kneel to standing through LLE.  Worked on foot placement and use of momentum for ease of transfer.  Also, educated patient that use of UE support is safer and can ultimately help transfer be more efficient if he has actually fallen.   Comments Tall kneeling>1/2 kneel with UE support x 5 reps each leg.  Then  practiced tall kneel>1/2 kneel, using RLE, as he would prefer to stand, with cues provided for optimal foot clearance and large step to place R foot in line with or ahead of R knee for optimal positioning in preparation to stand.     Ambulation/Gait   Ambulation/Gait Yes   Ambulation/Gait Assistance 6: Modified independent (Device/Increase time)   Ambulation Distance (Feet)  400 Feet  2   Assistive device None   Gait Pattern Step-through pattern;Decreased arm swing - right;Decreased arm swing - left;Decreased step length - right;Decreased step length - left;Decreased trunk rotation;Trunk flexed   Ambulation Surface Level;Indoor   Gait velocity 12.22 sec = 2.69 ft/sec  9.12 sec with cues for intention = 3.59 ft/sec   Stairs Yes   Stairs Assistance 6: Modified independent (Device/Increase time)   Stair Management Technique One rail Left;No rails;Alternating pattern;Step to pattern  Step to pattern carrying object with bilat. hands   Number of Stairs 4  x reps   Height of Stairs 6   Gait Comments Gait activities with conversation/cognitive tasks, no LOB.                PT Education - 04/28/17 2037    Education provided Yes    Education Details floor>stand transfer technique   Person(s) Educated Patient   Methods Explanation;Demonstration   Comprehension Verbalized understanding;Returned demonstration;Verbal cues required          PT Short Term Goals - 04/10/17 1238      PT SHORT TERM GOAL #1   Title Pt will be independent with HEP to target Parkinson's specific deficits.  TARGET 04/05/17   Baseline Pt able to perform HEP in therapy sessions, but reports not doing HEP at home.   Time 4   Period Weeks   Status Not Met     PT SHORT TERM GOAL #2   Title Pt will improve TUG score to less than or equal to 13.5 seconds for decreased fall risk.   Baseline 11.75 sec 04/10/17   Time 4   Period Weeks   Status Achieved     PT SHORT TERM GOAL #3   Title Pt will improve SLS to at least 3 seconds bilateral lower extremities for improved stair negotiation and obstacle negotiation.   Baseline <2 seconds bilateral   Time 4   Period Weeks   Status Not Met     PT SHORT TERM GOAL #4   Title Pt will perform 8 of 10 reps of sit<>stand transfers from 18" surfaces and below, with no UE support, no posterior lean, independently.   Time 4   Period Weeks   Status Achieved     PT SHORT TERM GOAL #5   Title Pt will negotiate at least 4 steps, 6 reps, with/without rail, alternating pattern, modified independently for safety with stair negotiation.   Time 4   Period Weeks   Status Achieved           PT Long Term Goals - 04/28/17 2041      PT LONG TERM GOAL #1   Title Pt will verbalize understanding of fall prevention/tips to reduce freezing with gait.  TARGET 05/05/17   Time 8   Period Weeks   Status On-going     PT LONG TERM GOAL #2   Title Pt will improve TUG cognitive to less than or equal to 15 seconds for decreased fall risk/improved dual tasking with gait.   Time 8   Period Weeks   Status On-going     PT LONG TERM GOAL #3   Title Pt will improve MiniBESTest to at least 24/28 for decreased fall risk.    Time 8   Period Weeks   Status On-going     PT LONG TERM GOAL #4   Title Pt will improve gait velocity to at least 2.62 ft/sec for improved gait efficiency and safety.  Baseline 2.69 ft/sec 2017/05/03   Time 8   Period Weeks   Status Achieved     PT LONG TERM GOAL #5   Title Pt will verbalize plans for ongoing community fitness upon D/C from PT.   Time 8   Period Weeks   Status On-going               Plan - 05/03/2017 16-Nov-2036    Clinical Impression Statement Pt is improving with floor>stand transfers, no UE support through RLE, but still has significant difficulty coming up through LLE.  Pt also appears to be improving stair negoitaiton.  Pt is on track towards LTGs and will likely discharge next week.   Rehab Potential Good   Clinical Impairments Affecting Rehab Potential  short term memory issues per speech eval   PT Frequency 2x / week   PT Duration 8 weeks  plus eval   PT Treatment/Interventions ADLs/Self Care Home Management;Functional mobility training;Stair training;Gait training;Patient/family education;Neuromuscular re-education;Balance training;Therapeutic exercise;Therapeutic activities   PT Next Visit Plan Review PT HEP and discuss PWR! Moves class; discharge next week   Consulted and Agree with Plan of Care Patient      Patient will benefit from skilled therapeutic intervention in order to improve the following deficits and impairments:  Abnormal gait, Decreased balance, Decreased mobility, Decreased strength, Difficulty walking, Impaired flexibility, Postural dysfunction, Impaired tone  Visit Diagnosis: Other symptoms and signs involving the nervous system  Unsteadiness on feet  Other abnormalities of gait and mobility       G-Codes - May 03, 2017 11/16/2040    Functional Assessment Tool Used (Outpatient Only) gait velocity 2.69 ft/sec; 3.59 ft/sec with cues; no reported falls since therapy began   Functional Limitation Mobility: Walking and moving around    Mobility: Walking and Moving Around Current Status [phone]) At least 20 percent but less than 40 percent impaired, limited or restricted   Mobility: Walking and Moving Around Goal Status [phone]) At least 20 percent but less than 40 percent impaired, limited or restricted      Problem List Patient Active Problem List   Diagnosis Date Noted  . Parkinson's disease ([place]) 03/20/2017  . Bilateral lower extremity edema 03/20/2017  . Cerumen impaction 09/13/2016  . Chronic venous insufficiency 09/13/2016  . Fatigue 03/15/2016  . Parkinsonian features 09/15/2015  . ED (erectile dysfunction) 09/15/2015  . MDD (major depressive disorder), recurrent episode, severe ([place]) 02/10/2014  . Nonspecific abnormal electrocardiogram (ECG) (EKG) 02/07/2014  . Non-compliant behavior 02/03/2014  . Morton's neuroma of left foot 08/07/2013  . 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 Hyperglycemia 01/26/2012  . Hypogonadism male 01/24/2012  . Syncope and collapse 01/20/2012  .  OSA (obstructive sleep apnea) 01/17/2011  . CHEST TIGHTNESS 02/18/2008    Sereniti Wan W. 05-03-17, 8:43 PM  Frazier Butt., PT   [facility] [address] [place], [place], [address] Phone: [phone]   Fax:  [phone]  Name: [name] [name] MRN: [id number] Date of Birth: [birth year]  Physical Therapy Progress Note  Dates of Reporting Period: 03/07/17 to 05/03/2017  Objective Reports of Subjective Statement: No falls since PT began  Objective Measurements: gait velocity 2.69 ft/sec, 3.59 ft/sec with cues for intensity; TUG 11.75 sec  Goal Update: See goals above.  Pt has met gait velocity goal and is on track towards other LTGs  Plan: Plans to continue towards LTGs, with likely plans for dischrage week of 05/01/17.  Reason Skilled Services are Required: Review  of PT HEP, continueing towards LTGs.  Frazier Butt., PT

## 2017-05-01 ENCOUNTER — Ambulatory Visit: Payer: Federal, State, Local not specified - PPO

## 2017-05-01 ENCOUNTER — Ambulatory Visit: Payer: Federal, State, Local not specified - PPO | Admitting: Physical Therapy

## 2017-05-01 ENCOUNTER — Ambulatory Visit: Payer: Federal, State, Local not specified - PPO | Admitting: Occupational Therapy

## 2017-05-01 DIAGNOSIS — R29898 Other symptoms and signs involving the musculoskeletal system: Secondary | ICD-10-CM

## 2017-05-01 DIAGNOSIS — R29818 Other symptoms and signs involving the nervous system: Secondary | ICD-10-CM

## 2017-05-01 DIAGNOSIS — R2681 Unsteadiness on feet: Secondary | ICD-10-CM

## 2017-05-01 DIAGNOSIS — R293 Abnormal posture: Secondary | ICD-10-CM | POA: Diagnosis not present

## 2017-05-01 DIAGNOSIS — R4701 Aphasia: Secondary | ICD-10-CM

## 2017-05-01 DIAGNOSIS — R471 Dysarthria and anarthria: Secondary | ICD-10-CM

## 2017-05-01 DIAGNOSIS — R41841 Cognitive communication deficit: Secondary | ICD-10-CM

## 2017-05-01 NOTE — Therapy (Signed)
[facility] [address] [place], [place], [address] Phone: [phone]   Fax:  [phone]  Physical Therapy Treatment  Patient Details  Name: [name] [name] MRN: [id number] Date of Birth: [birth year] Referring Provider: Alonza Bogus, DO  Encounter Date: 05/01/2017      PT End of Session - 05/01/17 1218    Visit Number 11  New episode of care, eval 03/07/17   Number of Visits 17   Date for PT Re-Evaluation 05/06/17   Authorization Type [employer]-75 visit limit combined   Authorization - Visit Number 11   Authorization - Number of Visits --  75 PT/OT combined   PT Start Time 0938   PT Stop Time 1018   PT Time Calculation (min) 40 min   Activity Tolerance Patient tolerated treatment well   Behavior During Therapy [facility] for tasks assessed/performed      Past Medical History:  Diagnosis Date  .  OSA (obstructive sleep apnea) 01/17/2011   npsg 2012:  AHI 67/hr. Auto titration 2012:  Optimal pressure 12cm.   . APPENDECTOMY, HX OF 02/18/2008   Qualifier: Diagnosis of  By: [place], [place]    . Cardiac murmur    as a child  . Depression   . Headache(784.0)   . HEADACHES, HX OF 02/18/2008   Qualifier: Diagnosis of  By: Danny Lawless CMA, [place]    . Parkinson disease ([place]) 11/2014  . Streptococcal meningitis    as an infant    Past Surgical History:  Procedure Laterality Date  . APPENDECTOMY  1967  . NASAL SINUS SURGERY     x 4 as a child  . VASECTOMY      There were no vitals filed for this visit.      Subjective Assessment - 05/01/17 0939    Subjective No new changes.  No falls.   Patient Stated Goals Pt's goals for therapy are to delay the symptoms of PD.   Currently in Pain? No/denies                         [facility] Adult PT Treatment/Exercise - 05/01/17 0001      Transfers   Transfers Floor to Transfer  Floor>stand transfer   Floor to Transfer 6: Modified independent  (Device/Increase time);5: Supervision  leading with RLE or LLE in half kneel   Floor to Transfer Details (indicate cue type and reason) Practiced multiple reps of tall kneel>1/2 kneel, through both RLE and LLE; with LLE leading from half kneel, pt needs initial UE support at mat, but able to stand without UE through LLE 1/2 kneel, on 2 trials.  Pt responds well to cue for forward lean, then up to stand.     Transfer Cueing Cues for widened foot placement from tall>half kneeling, for widened BOS upon standing.     Self-Care   Self-Care Other Self-Care Comments   Other Self-Care Comments  Discussed fall prevention (reviewed previously issued fall prevention, from previous bouts of therapy).  Discussed footwear, stair negotiation, especially, as contributing factors to pt's most recent 2 falls (prior to beginning of this bout of PT)           PWR [facility]) - 05/01/17 1003    PWR! exercises Moves in standing   PWR! Up x 20   PWR! Rock x 20   PWR! Twist  x 20   PWR Step x 20   Comments Review of standing PWR! Moves as  part of HEP.  Pt return demo understanding with min verbal cues.             PT Education - 05/01/17 1218    Education provided Yes   Education Details Fall prevention education   Person(s) Educated Patient   Methods Explanation;Handout   Comprehension Verbalized understanding          PT Short Term Goals - 04/10/17 1238      PT SHORT TERM GOAL #1   Title Pt will be independent with HEP to target Parkinson's specific deficits.  TARGET 04/05/17   Baseline Pt able to perform HEP in therapy sessions, but reports not doing HEP at home.   Time 4   Period Weeks   Status Not Met     PT SHORT TERM GOAL #2   Title Pt will improve TUG score to less than or equal to 13.5 seconds for decreased fall risk.   Baseline 11.75 sec 04/10/17   Time 4   Period Weeks   Status Achieved     PT SHORT TERM GOAL #3   Title Pt will improve SLS to at least 3 seconds bilateral lower  extremities for improved stair negotiation and obstacle negotiation.   Baseline <2 seconds bilateral   Time 4   Period Weeks   Status Not Met     PT SHORT TERM GOAL #4   Title Pt will perform 8 of 10 reps of sit<>stand transfers from 18" surfaces and below, with no UE support, no posterior lean, independently.   Time 4   Period Weeks   Status Achieved     PT SHORT TERM GOAL #5   Title Pt will negotiate at least 4 steps, 6 reps, with/without rail, alternating pattern, modified independently for safety with stair negotiation.   Time 4   Period Weeks   Status Achieved           PT Long Term Goals - 05/01/17 1224      PT LONG TERM GOAL #1   Title Pt will verbalize understanding of fall prevention/tips to reduce freezing with gait.  TARGET 05/05/17   Time 8   Period Weeks   Status Achieved     PT LONG TERM GOAL #2   Title Pt will improve TUG cognitive to less than or equal to 15 seconds for decreased fall risk/improved dual tasking with gait.   Time 8   Period Weeks   Status On-going     PT LONG TERM GOAL #3   Title Pt will improve MiniBESTest to at least 24/28 for decreased fall risk.   Time 8   Period Weeks   Status On-going     PT LONG TERM GOAL #4   Title Pt will improve gait velocity to at least 2.62 ft/sec for improved gait efficiency and safety.   Baseline 2.69 ft/sec 04/28/17   Time 8   Period Weeks   Status Achieved     PT LONG TERM GOAL #5   Title Pt will verbalize plans for ongoing community fitness upon D/C from PT.   Time 8   Period Weeks   Status On-going               Plan - 05/01/17 1219    Clinical Impression Statement Pt continues to improve ability and confidence with floor>stand transfers.  Pt is less reliant on LUE when coming up through 1/2 kneel on LLE.  Pt performs standing PWR! Moves as part of HEP without difficulty.  (  OT has provided exercise chart outlined HEP from PT and OT as part of weekly exercise program at home).  Pt will be  appropriate for discharge from PT next visit.   Rehab Potential Good   Clinical Impairments Affecting Rehab Potential  short term memory issues per speech eval   PT Frequency 2x / week   PT Duration 8 weeks  plus eval   PT Treatment/Interventions ADLs/Self Care Home Management;Functional mobility training;Stair training;Gait training;Patient/family education;Neuromuscular re-education;Balance training;Therapeutic exercise;Therapeutic activities   PT Next Visit Plan Check remaining goals and plan for discharge this week.   Consulted and Agree with Plan of Care Patient      Patient will benefit from skilled therapeutic intervention in order to improve the following deficits and impairments:  Abnormal gait, Decreased balance, Decreased mobility, Decreased strength, Difficulty walking, Impaired flexibility, Postural dysfunction, Impaired tone  Visit Diagnosis: Unsteadiness on feet  Other symptoms and signs involving the nervous system     Problem List Patient Active Problem List   Diagnosis Date Noted  . Parkinson's disease ([place]) 03/20/2017  . Bilateral lower extremity edema 03/20/2017  . Cerumen impaction 09/13/2016  . Chronic venous insufficiency 09/13/2016  . Fatigue 03/15/2016  . Parkinsonian features 09/15/2015  . ED (erectile dysfunction) 09/15/2015  . MDD (major depressive disorder), recurrent episode, severe ([place]) 02/10/2014  . Nonspecific abnormal electrocardiogram (ECG) (EKG) 02/07/2014  . Non-compliant behavior 02/03/2014  . Morton's neuroma of left foot 08/07/2013  . 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 Hyperglycemia 01/26/2012  . Hypogonadism male 01/24/2012  . Syncope and collapse 01/20/2012  .  OSA (obstructive sleep apnea) 01/17/2011  . CHEST TIGHTNESS 02/18/2008    Noora Locascio W. 05/01/2017, 12:27 PM  Frazier Butt., PT   [facility] [address] [place], [place], [address] Phone: [phone]   Fax:  [phone]  Name: [name] [name] MRN: [id number] Date of Birth: 10/02/1951

## 2017-05-01 NOTE — Therapy (Signed)
[facility] [address] [place], [place], [address] Phone: [phone]   Fax:  [phone]  Occupational Therapy Treatment  Patient Details  Name: [name] [name] MRN: [id number] Date of Birth: 05/15/1951 Referring Provider: Dr. [name] Tat   Encounter Date: 05/01/2017      OT End of Session - 05/01/17 1033    Visit Number 11   Number of Visits 17   Date for OT Re-Evaluation 05/06/17   Authorization Type BCBS   Authorization Time Period 75 visit limit combined    Authorization - Visit Number 11   Authorization - Number of Visits 25   OT Start Time 0848   OT Stop Time 0935   OT Time Calculation (min) 47 min   Activity Tolerance Patient tolerated treatment well      Past Medical History:  Diagnosis Date  .  OSA (obstructive sleep apnea) 01/17/2011   npsg 2012:  AHI 67/hr. Auto titration 2012:  Optimal pressure 12cm.   . APPENDECTOMY, HX OF 02/18/2008   Qualifier: Diagnosis of  By: [place], [place]    . Cardiac murmur    as a child  . Depression   . Headache(784.0)   . HEADACHES, HX OF 02/18/2008   Qualifier: Diagnosis of  By: Danny Lawless CMA, [place]    . Parkinson disease ([place]) 11/2014  . Streptococcal meningitis    as an infant    Past Surgical History:  Procedure Laterality Date  . APPENDECTOMY  1967  . NASAL SINUS SURGERY     x 4 as a child  . VASECTOMY      There were no vitals filed for this visit.      Subjective Assessment - 05/01/17 0853    Pertinent History Parkinson's disease, seeing illusions, hx of falls, hx of significant depression, mild cognitive impairment, sleep apnea, hypogonadism, ADD   Limitations orthostatic BP!!! monitor PRN   Patient Stated Goals improved balance and ease with ADLs   Currently in Pain? No/denies                      OT Treatments/Exercises (OP) - 05/01/17 0001      ADLs   ADL Comments Pt issued ex chart and discussed most practical way  to implement PWR! Moves into daily routine with community fitness. Issued to pt and discussed with therapist. Therapist then reviewed stretches to do in the am prior to dressing to incr. flexibility and ease with dressing.            PWR [facility]) - 05/01/17 1003    PWR! exercises Moves in standing   PWR! Up x 20   PWR! Rock x 20   PWR! Twist  x 20   PWR Step x 20             OT Education - 05/01/17 1030    Education provided Yes   Education Details Ex chart (incorporating community fitness and PWR! moves), reviewed and re-printed stretches prior to dressing   Person(s) Educated Patient   Methods Explanation;Handout   Comprehension Verbalized understanding          OT Short Term Goals - 04/24/17 1045      OT SHORT TERM GOAL #1   Title Pt will be independent with PD specific  HEP.--check STGs 04/05/17   Time 4   Period Weeks   Status On-going  stretches prior to dressing issued, pt has not practiced at home.  OT SHORT TERM GOAL #2   Title Pt will verbalize understanding of ways to prevent future complications and current available/appropriate community resources prn.   Time 4   Period Weeks   Status On-going  initiated, needs futher education     OT SHORT TERM GOAL #3   Title Pt will improve bilateral hand coordination as shown by fastening/unfastening 3 buttons in 50sec or less.   Time 4   Period Weeks   Status Achieved  04/24/17:  49.22sec with min cueing for use of strategies     OT SHORT TERM GOAL #4   Title Pt will report incr ease with donning socks/shoes using adaptive strategies/AE prn.   Time 4   Status On-going  Pt has not attempted stretches at home., Pt demonstrates ability to perform in clinic     OT SHORT TERM GOAL #5   Title Pt will demonstrate ability to write a 3 sentences with 100% legibility and only min decrease in letter size.   Time 4   Period Weeks   Status Achieved  04/24/17:  100% legibilty, no significant decr in size.            OT Long Term Goals - 04/24/17 1046      OT LONG TERM GOAL #1   Title Pt will verbalize understanding of adapted strategies for ADLs/IADLs--check LTGs 05/06/17   Time 8   Period Weeks   Status New     OT LONG TERM GOAL #2   Title Pt will demonstrate improved functional standing balance as evidenced by increasing LUE standing functional reach to 10 inches or greater   Baseline RUE 10 in, LUE 9 in   Time 8   Period Weeks   Status New     OT LONG TERM GOAL #3   Title Pt will improve bilateral hand coordination as shown by fastening/unfastening 3 buttons in 45sec or less.   Baseline 61.60sec   Time 8   Period Weeks   Status New     OT LONG TERM GOAL #4   Title Pt will be able to carry object in both hands when ambulating with direction changes and simple cognitive component without freezing for incr ease/safety with IADLs.   Baseline ----   Time 8   Period Weeks   Status New     OT LONG TERM GOAL #5   Title Pt will improve functional reaching/coordination for ADLs as shown by improving score on box and blocks test by at least 4 blocks bilaterally   Baseline 46 bilaterally   Time 8   Period Weeks   Status New     OT LONG TERM GOAL #6   Title Pt will improve coordination of dominant R hand for ADLs as shown by improving time on 9-hole peg test by at least 4sec.   Baseline R-32.69sec, L-29.85sec   Time 8   Period Weeks   Status On-going  04/24/17:  32.66sec, 31.75sec               Plan - 05/01/17 1034    Clinical Impression Statement Pt requires encouragement to participate in PWR! Moves. Progressing slowly towards goals   Rehab Potential Good   Current Impairments/barriers affecting progress: cognitive deficits- Pt is having orthostatic BP, monitor PRN   OT Frequency 2x / week   OT Duration 8 weeks   OT Treatment/Interventions Self-care/ADL training;Therapeutic exercise;Cognitive remediation/compensation;Visual/perceptual remediation/compensation;Neuromuscular  education;Parrafin;Moist Heat;Fluidtherapy;Energy conservation;Therapeutic exercises;Patient/family education;Balance training;Therapeutic activities;Passive range of motion;Manual Therapy;DME and/or AE instruction;Ultrasound;Cryotherapy;Functional Mobility  Training   Plan begin assessing goals (? renewal)   OT Home Exercise Plan Education provided:  stretches prior to dressing, strategies for buttoning, ex chart   Consulted and Agree with Plan of Care Patient      Patient will benefit from skilled therapeutic intervention in order to improve the following deficits and impairments:  Decreased cognition, Impaired flexibility, Decreased mobility, Decreased coordination, Decreased endurance, Decreased range of motion, Decreased strength, Impaired UE functional use, Impaired tone, Impaired perceived functional ability, Decreased safety awareness, Difficulty walking, Decreased balance, Decreased activity tolerance, Impaired vision/preception, Improper spinal/pelvic alignment  Visit Diagnosis: Other symptoms and signs involving the nervous system  Other symptoms and signs involving the musculoskeletal system    Problem List Patient Active Problem List   Diagnosis Date Noted  . Parkinson's disease ([place]) 03/20/2017  . Bilateral lower extremity edema 03/20/2017  . Cerumen impaction 09/13/2016  . Chronic venous insufficiency 09/13/2016  . Fatigue 03/15/2016  . Parkinsonian features 09/15/2015  . ED (erectile dysfunction) 09/15/2015  . MDD (major depressive disorder), recurrent episode, severe ([place]) 02/10/2014  . Nonspecific abnormal electrocardiogram (ECG) (EKG) 02/07/2014  . Non-compliant behavior 02/03/2014  . Morton's neuroma of left foot 08/07/2013  . 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 Hyperglycemia 01/26/2012  . Hypogonadism male 01/24/2012  . Syncope and collapse 01/20/2012  .  OSA (obstructive sleep apnea) 01/17/2011  . CHEST TIGHTNESS 02/18/2008     Carey Bullocks, OTR/L 05/01/2017, 10:36 AM  [facility] [address] [place], [place], [address] Phone: [phone]   Fax:  [phone]  Name: CHASON MCIVER MRN: [id number] Date of Birth: [birth year]

## 2017-05-01 NOTE — Therapy (Signed)
[facility] [address] [place], [place], [address] Phone: [phone]   Fax:  [phone]  Speech Language Pathology Treatment  Patient Details  Name: [name] [name] MRN: [id number] Date of Birth: Dec 09, 1950 Referring Provider: Alonza Bogus, DO  Encounter Date: 05/01/2017      End of Session - 05/01/17 1106    Visit Number 10   Number of Visits 17   Date for SLP Re-Evaluation 06/02/17   Authorization - Visit Number 10   Authorization - Number of Visits 25   SLP Start Time 2671   SLP Stop Time  1100   SLP Time Calculation (min) 42 min   Activity Tolerance Patient tolerated treatment well      Past Medical History:  Diagnosis Date  .  OSA (obstructive sleep apnea) 01/17/2011   npsg 2012:  AHI 67/hr. Auto titration 2012:  Optimal pressure 12cm.   . APPENDECTOMY, HX OF 02/18/2008   Qualifier: Diagnosis of  By: [place], [place]    . Cardiac murmur    as a child  . Depression   . Headache(784.0)   . HEADACHES, HX OF 02/18/2008   Qualifier: Diagnosis of  By: Danny Lawless CMA, [place]    . Parkinson disease ([employer]) 11/2014  . Streptococcal meningitis    as an infant    Past Surgical History:  Procedure Laterality Date  . APPENDECTOMY  1967  . NASAL SINUS SURGERY     x 4 as a child  . VASECTOMY      There were no vitals filed for this visit.             ADULT SLP TREATMENT - 05/01/17 1047      General Information   Behavior/Cognition Alert;Cooperative;Pleasant mood     Treatment Provided   Treatment provided Cognitive-Linquistic     Cognitive-Linquistic Treatment   Treatment focused on Dysarthria   Skilled Treatment Pt reported he is not completing loud /a/ ("HEYYYY-AHHH") as prescribed - doing about 3 reps/day. Loud /a/ today used againto recalibrate conversational loudness - pt averaged low 90s dB with usual min cues for loudness and consistent min cues for full breath. Conversation prior to  loud /a/ was completed at upper 60s/lower 70s dB, After loud /a/ mod complex conversation averaged lower 70s dB.  SLP strongly stressed to pt to complete loud /a/ ("Calcium!") at recommended frequency.      Assessment / Recommendations / Plan   Plan Continue with current plan of care     Progression Toward Goals   Progression toward goals Progressing toward goals            SLP Short Term Goals - 04/28/17 1035      SLP SHORT TERM GOAL #1   Title (P)  Pt will average loud /a/ of 88dB over 3 sessions   Status (P)  Achieved     SLP SHORT TERM GOAL #2   Title (P)  Pt will average low-70s dB  in 10 minutes simple conversation with rare min A over 3 sessions   Status (P)  Achieved     SLP SHORT TERM GOAL #3   Title (P)  pt will demo working memory skills appropriate for recall of 80% of details   Time (P)  1   Period (P)  Weeks   Status (P)  Deferred  work on Animal nutritionist     SLP SHORT TERM GOAL #4   Title (P)  pt will tell SLP three ways  to compensate for verbal expression errors/events over two sessions   Status (P)  Deferred          SLP Long Term Goals - 05/01/17 1108      SLP LONG TERM GOAL #1   Title pt to maintain average 70dB in 15 minutes mod complex conversation outside [place] room over three sessions   Time 4   Period Weeks   Status On-going     SLP LONG TERM GOAL #2   Title pt will demo aphasia compenastions functionally in 8 minutes mod complex conversation over two sessions   Time 4   Period Weeks   Status On-going     SLP LONG TERM GOAL #3   Title pt will demo working memory skills adequate for functional verbal expression in 15 minutes mod complex conversation over two sessions   Time 4   Period Weeks   Status On-going          Plan - 05/01/17 1107    Clinical Impression Statement Pt continues improving his conversational loudness in short simple conversation (approx 15 minutes in length). Pt would cont to benefit from skilled ST addressing  conversational loudness and other areas of language and cogntive-linguistics,.   Speech Therapy Frequency 2x / week   Duration --  8 weeks   Treatment/Interventions Language facilitation;Internal/external aids;Compensatory techniques;SLP instruction and feedback;Multimodal communcation approach;Cognitive reorganization;Functional tasks;Cueing hierarchy;Patient/family education   Potential to Achieve Goals Good   Potential Considerations Severity of impairments      Patient will benefit from skilled therapeutic intervention in order to improve the following deficits and impairments:   Dysarthria and anarthria  Aphasia  Cognitive communication deficit    Problem List Patient Active Problem List   Diagnosis Date Noted  . Parkinson's disease ([place]) 03/20/2017  . Bilateral lower extremity edema 03/20/2017  . Cerumen impaction 09/13/2016  . Chronic venous insufficiency 09/13/2016  . Fatigue 03/15/2016  . Parkinsonian features 09/15/2015  . ED (erectile dysfunction) 09/15/2015  . MDD (major depressive disorder), recurrent episode, severe ([place]) 02/10/2014  . Nonspecific abnormal electrocardiogram (ECG) (EKG) 02/07/2014  . Non-compliant behavior 02/03/2014  . Morton's neuroma of left foot 08/07/2013  . 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 Hyperglycemia 01/26/2012  . Hypogonadism male 01/24/2012  . Syncope and collapse 01/20/2012  .  OSA (obstructive sleep apnea) 01/17/2011  . CHEST TIGHTNESS 02/18/2008    Tarika Mckethan ,MS, CCC-SLP  05/01/2017, 11:16 AM  [facility] [address] [place], [place], [address] Phone: [phone]   Fax:  [phone]   Name: [name] [name] MRN: [id number] Date of Birth: 15-Oct-1950

## 2017-05-01 NOTE — Patient Instructions (Addendum)
(  Exercise) Monday Tuesday Wednesday Thursday Friday Saturday Sunday   Stretches for dressing           A.C.T.            Boxing           PWR! Quadraped (on hands & knees)           PWR! Supine (lying on back)            PWR! Standing            PWR! Seated (PWR! Step only)           Coordination            PWR! Hands

## 2017-05-05 ENCOUNTER — Ambulatory Visit: Payer: Federal, State, Local not specified - PPO

## 2017-05-05 ENCOUNTER — Ambulatory Visit: Payer: Federal, State, Local not specified - PPO | Attending: Neurology | Admitting: Physical Therapy

## 2017-05-05 DIAGNOSIS — R471 Dysarthria and anarthria: Secondary | ICD-10-CM | POA: Insufficient documentation

## 2017-05-05 DIAGNOSIS — R293 Abnormal posture: Secondary | ICD-10-CM | POA: Insufficient documentation

## 2017-05-05 DIAGNOSIS — R41840 Attention and concentration deficit: Secondary | ICD-10-CM | POA: Insufficient documentation

## 2017-05-05 DIAGNOSIS — R4701 Aphasia: Secondary | ICD-10-CM

## 2017-05-05 DIAGNOSIS — R278 Other lack of coordination: Secondary | ICD-10-CM | POA: Diagnosis present

## 2017-05-05 DIAGNOSIS — R29818 Other symptoms and signs involving the nervous system: Secondary | ICD-10-CM | POA: Insufficient documentation

## 2017-05-05 DIAGNOSIS — R2689 Other abnormalities of gait and mobility: Secondary | ICD-10-CM | POA: Insufficient documentation

## 2017-05-05 DIAGNOSIS — R41841 Cognitive communication deficit: Secondary | ICD-10-CM

## 2017-05-05 DIAGNOSIS — R2681 Unsteadiness on feet: Secondary | ICD-10-CM

## 2017-05-05 DIAGNOSIS — R29898 Other symptoms and signs involving the musculoskeletal system: Secondary | ICD-10-CM | POA: Insufficient documentation

## 2017-05-05 NOTE — Therapy (Signed)
[facility] [address] [place], [place], [address] Phone: [phone]   Fax:  [phone]  Speech Language Pathology Treatment  Patient Details  Name: [name] [name] MRN: [id number] Date of Birth: 04-27-1951 Referring Provider: Alonza Bogus, DO  Encounter Date: 05/05/2017      End of Session - 05/05/17 0948    Visit Number 11   Number of Visits 17   Date for SLP Re-Evaluation 06/02/17   Authorization Type 75 total visits   Authorization - Visit Number 11   Authorization - Number of Visits 25   SLP Start Time 0848   SLP Stop Time  5176   SLP Time Calculation (min) 43 min   Activity Tolerance Patient tolerated treatment well      Past Medical History:  Diagnosis Date  .  OSA (obstructive sleep apnea) 01/17/2011   npsg 2012:  AHI 67/hr. Auto titration 2012:  Optimal pressure 12cm.   . APPENDECTOMY, HX OF 02/18/2008   Qualifier: Diagnosis of  By: [place], [place]    . Cardiac murmur    as a child  . Depression   . Headache(784.0)   . HEADACHES, HX OF 02/18/2008   Qualifier: Diagnosis of  By: Danny Lawless CMA, [place]    . Parkinson disease ([place]) 11/2014  . Streptococcal meningitis    as an infant    Past Surgical History:  Procedure Laterality Date  . APPENDECTOMY  1967  . NASAL SINUS SURGERY     x 4 as a child  . VASECTOMY      There were no vitals filed for this visit.      Subjective Assessment - 05/05/17 0900    Subjective Pt admitted to suboptimal loud /a/ practice.   Currently in Pain? No/denies               ADULT SLP TREATMENT - 05/05/17 0901      General Information   Behavior/Cognition Alert;Cooperative;Pleasant mood     Treatment Provided   Treatment provided Cognitive-Linquistic     Cognitive-Linquistic Treatment   Treatment focused on Dysarthria   Skilled Treatment Pt reported he continues suboptimal frequency of loud /a/ ("HEYYYY-AHHH") as prescribed. Loud /a/ today  used again to recalibrate conversational loudness - pt averaged upper 80s - low 90s dB with occasional min cues for loudness and consistent min cues for full breath. SLP worked with pt today on working memory and discussed strategies for this: remembering/repeating key words, writing down key words, visualization. Pt with fair success today.     Assessment / Recommendations / Plan   Plan Continue with current plan of care     Progression Toward Goals   Progression toward goals Progressing toward goals          SLP Education - 05/05/17 0948    Education provided Yes   Education Details working memory/memory compenstaions   Person(s) Educated Patient   Methods Explanation   Comprehension Verbalized understanding;Verbal cues required;Returned demonstration          SLP Short Term Goals - 04/28/17 1035      SLP SHORT TERM GOAL #1   Title (P)  Pt will average loud /a/ of 88dB over 3 sessions   Status (P)  Achieved     SLP SHORT TERM GOAL #2   Title (P)  Pt will average low-70s dB  in 10 minutes simple conversation with rare min A over 3 sessions   Status (P)  Achieved  SLP SHORT TERM GOAL #3   Title (P)  pt will demo working memory skills appropriate for recall of 80% of details   Time (P)  1   Period (P)  Weeks   Status (P)  Deferred  work on Animal nutritionist     SLP [place] #4   Title (P)  pt will tell SLP three ways to compensate for verbal expression errors/events over two sessions   Status (P)  Deferred          SLP Long Term Goals - 05/05/17 0949      SLP LONG TERM GOAL #1   Title pt to maintain average 70dB in 15 minutes mod complex conversation outside [place] room over three sessions   Time 4   Period Weeks   Status On-going     SLP LONG TERM GOAL #2   Title pt will demo aphasia compenastions functionally in 8 minutes mod complex conversation over two sessions   Time 4   Period Weeks   Status On-going     SLP LONG TERM GOAL #3   Title pt will demo  working memory skills adequate for functional verbal expression in 15 minutes mod complex conversation over two sessions   Time 4   Period Weeks   Status On-going          Plan - 05/05/17 0949    Clinical Impression Statement Pt continues improving his conversational loudness in short simple conversation (approx 15 minutes in length). Pt needs to work with SLP more on compensations for memory deficit. Pt would cont to benefit from skilled ST addressing conversational loudness and other areas of language and cogntive-linguistics,.   Speech Therapy Frequency 2x / week   Duration --  8 weeks   Treatment/Interventions Language facilitation;Internal/external aids;Compensatory techniques;SLP instruction and feedback;Multimodal communcation approach;Cognitive reorganization;Functional tasks;Cueing hierarchy;Patient/family education   Potential to Achieve Goals Good   Potential Considerations Severity of impairments      Patient will benefit from skilled therapeutic intervention in order to improve the following deficits and impairments:   Dysarthria and anarthria  Aphasia  Cognitive communication deficit    Problem List Patient Active Problem List   Diagnosis Date Noted  . Parkinson's disease ([place]) 03/20/2017  . Bilateral lower extremity edema 03/20/2017  . Cerumen impaction 09/13/2016  . Chronic venous insufficiency 09/13/2016  . Fatigue 03/15/2016  . Parkinsonian features 09/15/2015  . ED (erectile dysfunction) 09/15/2015  . MDD (major depressive disorder), recurrent episode, severe ([place]) 02/10/2014  . Nonspecific abnormal electrocardiogram (ECG) (EKG) 02/07/2014  . Non-compliant behavior 02/03/2014  . Morton's neuroma of left foot 08/07/2013  . 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 Hyperglycemia 01/26/2012  . Hypogonadism male 01/24/2012  . Syncope and collapse 01/20/2012  .  OSA (obstructive sleep apnea) 01/17/2011  . CHEST TIGHTNESS  02/18/2008    Kashana Breach ,MS, CCC-SLP  05/05/2017, 9:50 AM  [facility] [address] [place], [place], [address] Phone: [phone]   Fax:  [phone]   Name: [name] [name] MRN: [id number] Date of Birth: 07-Jan-1951

## 2017-05-05 NOTE — Therapy (Signed)
[facility] [address] [place], [place], [address] Phone: [phone]   Fax:  [phone]  Physical Therapy Treatment  Patient Details  Name: [name] [name] MRN: [id number] Date of Birth: 23-Dec-1950 Referring Provider: Alonza Bogus, DO  Encounter Date: 05/05/2017      PT End of Session - 05/05/17 1734    Visit Number 12  New episode of care, eval 03/07/17   Number of Visits 17   Date for PT Re-Evaluation 05/06/17   Authorization Type [employer]-75 visit limit combined   Authorization - Visit Number 12   Authorization - Number of Visits --  75 PT/OT combined   PT Start Time 0934   PT Stop Time 1014   PT Time Calculation (min) 40 min   Activity Tolerance Patient tolerated treatment well   Behavior During Therapy [facility] for tasks assessed/performed      Past Medical History:  Diagnosis Date  .  OSA (obstructive sleep apnea) 01/17/2011   npsg 2012:  AHI 67/hr. Auto titration 2012:  Optimal pressure 12cm.   . APPENDECTOMY, HX OF 02/18/2008   Qualifier: Diagnosis of  By: [place], [place]    . Cardiac murmur    as a child  . Depression   . Headache(784.0)   . HEADACHES, HX OF 02/18/2008   Qualifier: Diagnosis of  By: Danny Lawless CMA, [place]    . Parkinson disease ([place]) 11/2014  . Streptococcal meningitis    as an infant    Past Surgical History:  Procedure Laterality Date  . APPENDECTOMY  1967  . NASAL SINUS SURGERY     x 4 as a child  . VASECTOMY      There were no vitals filed for this visit.      Subjective Assessment - 05/05/17 0937    Subjective No changes, no falls.  Been doing exercises at home-some, but many.   Patient Stated Goals Pt's goals for therapy are to delay the symptoms of PD.   Currently in Pain? No/denies                         [facility] Adult PT Treatment/Exercise - 05/05/17 0937      Transfers   Transfers Sit to Stand;Stand to Sit   Sit to Stand 6: Modified  independent (Device/Increase time);Without upper extremity assist;From chair/3-in-1   Five time sit to stand comments  9.31   Stand to Sit 6: Modified independent (Device/Increase time);Without upper extremity assist;To chair/3-in-1   Floor to Transfer 6: Modified independent (Device/Increase time);5: Supervision   Floor to Transfer Details (indicate cue type and reason) Able to come up through half kneel on RLE independently, but needs UE support when coming up through LLE.     Ambulation/Gait   Ambulation/Gait Yes   Ambulation/Gait Assistance 6: Modified independent (Device/Increase time)   Ambulation Distance (Feet) 400 Feet   Assistive device None   Gait Pattern Step-through pattern;Decreased arm swing - right;Decreased arm swing - left;Decreased step length - right;Decreased step length - left;Decreased trunk rotation;Trunk flexed   Ambulation Surface Level;Indoor   Gait Comments Gait with conversation tasks, no LOB     Timed Up and Go Test   Normal TUG (seconds) 12.13   Cognitive TUG (seconds) 14.44     High Level Balance   High Level Balance Comments MiniBESTest score 21/28-see note for full details     Self-Care   Self-Care Other Self-Care Comments   Other Self-Care Comments  Reviewed progress towards goals and POC, including plans to discharge this visit.  Reiterated importance of continuing Parkinson's specific HEP, even though he is working out now at [employer] and [employer].  Provided information on upcoming [employer] community exercise class.       Mini-BESTest: Balance Evaluation Systems Test  2005-2013 [facility] & [employer]. All rights reserved. ________________________________________________________________________________________Anticipatory_________Subscore__4___/6 1. SIT TO STAND Instruction: "Cross your arms across your chest. Try not to use your hands unless you must.Do not let your legs lean against the back of the chair when you stand. Please  stand up now." X(2) Normal: Comes to stand without use of hands and stabilizes independently. (1) Moderate: Comes to stand WITH use of hands on first attempt. (0) Severe: Unable to stand up from chair without assistance, OR needs several attempts with use of hands. 2. RISE TO TOES Instruction: "Place your feet shoulder width apart. Place your hands on your hips. Try to rise as high as you can onto your toes. I will count out loud to 3 seconds. Try to hold this pose for at least 3 seconds. Look straight ahead. Rise now." (2) Normal: Stable for 3 s with maximum height. X(1) Moderate: Heels up, but not full range (smaller than when holding hands), OR noticeable instability for 3 s. (0) Severe: < 3 s. 3. STAND ON ONE LEG Instruction: "Look straight ahead. Keep your hands on your hips. Lift your leg off of the ground behind you without touching or resting your raised leg upon your other standing leg. Stay standing on one leg as long as you can. Look straight ahead. Lift now." Left: Time in Seconds Trial 1:__1.4 ___Trial 2:_1.53____ (2) Normal: 20 s. X(1) Moderate: < 20 s. (0) Severe: Unable. Right: Time in Seconds Trial 1:_1.66____Trial 2:__8.91___ (2) Normal: 20 s. X(1) Moderate: < 20 s. (0) Severe: Unable To score each side separately use the trial with the longest time. To calculate the sub-score and total score use the side [left or right] with the lowest numerical score [i.e. the worse side]. ______________________________________________________________________________________Reactive Postural Control___________Subscore:__5___/6 4. COMPENSATORY STEPPING CORRECTION- FORWARD Instruction: "Stand with your feet shoulder width apart, arms at your sides. Lean forward against my hands beyond your forward limits. When I let go, do whatever is necessary, including taking a step, to avoid a fall." X(2) Normal: Recovers independently with a single, large step (second realignment step is  allowed). (1) Moderate: More than one step used to recover equilibrium. (0) Severe: No step, OR would fall if not caught, OR falls spontaneously. 5. COMPENSATORY STEPPING CORRECTION- BACKWARD Instruction: "Stand with your feet shoulder width apart, arms at your sides. Lean backward against my hands beyond your backward limits. When I let go, do whatever is necessary, including taking a step, to avoid a fall." (2) Normal: Recovers independently with a single, large step. X(1) Moderate: More than one step used to recover equilibrium. (0) Severe: No step, OR would fall if not caught, OR falls spontaneously. 6. COMPENSATORY STEPPING CORRECTION- LATERAL Instruction: "Stand with your feet together, arms down at your sides. Lean into my hand beyond your sideways limit. When I let go, do whatever is necessary, including taking a step, to avoid a fall." Left X(2) Normal: Recovers independently with 1 step (crossover or lateral OK). (1) Moderate: Several steps to recover equilibrium. (0) Severe: Falls, or cannot step. Right X(2) Normal: Recovers independently with 1 step (crossover or lateral OK). (1) Moderate: Several steps to recover equilibrium. (0) Severe: Falls, or cannot  step. Use the side with the lowest score to calculate sub-score and total score. ____________________________________________________________________________________Sensory Orientation_____________Subscore:_______6__/6 7. STANCE (FEET TOGETHER); EYES OPEN, FIRM SURFACE Instruction: "Place your hands on your hips. Place your feet together until almost touching. Look straight ahead. Be as stable and still as possible, until I say stop." Time in seconds:________ X(2) Normal: 30 s. (1) Moderate: < 30 s. (0) Severe: Unable. 8. STANCE (FEET TOGETHER); EYES CLOSED, FOAM SURFACE Instruction: "Step onto the foam. Place your hands on your hips. Place your feet together until almost touching. Be as stable and still as possible,  until I say stop. I will start timing when you close your eyes." Time in seconds:________ X(2) Normal: 30 s. (1) Moderate: < 30 s. (0) Severe: Unable. 9. INCLINE- EYES CLOSED Instruction: "Step onto the incline ramp. Please stand on the incline ramp with your toes toward the top. Place your feet shoulder width apart and have your arms down at your sides. I will start timing when you close your eyes." Time in seconds:________ X(2) Normal: Stands independently 30 s and aligns with gravity. (1) Moderate: Stands independently <30 s OR aligns with surface. (0) Severe: Unable. _________________________________________________________________________________________Dynamic Gait ______Subscore______6__/10 10. CHANGE IN GAIT SPEED Instruction: "Begin walking at your normal speed, when I tell you 'fast', walk as fast as you can. When I say 'slow', walk very slowly." (2) Normal: Significantly changes walking speed without imbalance. X(1) Moderate: Unable to change walking speed or signs of imbalance. (0) Severe: Unable to achieve significant change in walking speed AND signs of imbalance. [place] - HORIZONTAL Instruction: "Begin walking at your normal speed, when I say "right", turn your head and look to the right. When I say "left" turn your head and look to the left. Try to keep yourself walking in a straight line." (2) Normal: performs head turns with no change in gait speed and good balance. X(1) Moderate: performs head turns with reduction in gait speed. (0) Severe: performs head turns with imbalance. 12. WALK WITH PIVOT TURNS Instruction: "Begin walking at your normal speed. When I tell you to 'turn and stop', turn as quickly as you can, face the opposite direction, and stop. After the turn, your feet should be close together." X(2) Normal: Turns with feet close FAST (< 3 steps) with good balance. (1) Moderate: Turns with feet close SLOW (>4 steps) with good balance. (0)  Severe: Cannot turn with feet close at any speed without imbalance. 13. STEP OVER OBSTACLES Instruction: "Begin walking at your normal speed. When you get to the box, step over it, not around it and keep walking." (2) Normal: Able to step over box with minimal change of gait speed and with good balance. X(1) Moderate: Steps over box but touches box OR displays cautious behavior by slowing gait. (0) Severe: Unable to step over box OR steps around box. 14. TIMED UP & GO WITH DUAL TASK [3 METER WALK] Instruction TUG: "When I say 'Go', stand up from chair, walk at your normal speed across the tape on the floor, turn around, and come back to sit in the chair." Instruction TUG with Dual Task: "Count backwards by threes starting at ___. When I say 'Go', stand up from chair, walk at your normal speed across the tape on the floor, turn around, and come back to sit in the chair. Continue counting backwards the entire time." TUG: ________seconds; Dual Task TUG: ________seconds (2) Normal: No noticeable change in sitting, standing or walking while backward counting  when compared to TUG without Dual Task. X(1) Moderate: Dual Task affects either counting OR walking (>10%) when compared to the TUG without Dual Task. (0) Severe: Stops counting while walking OR stops walking while counting. When scoring item 14, if subject's gait speed slows more than 10% between the TUG without and with a Dual Task the score should be decreased by a point. TOTAL SCORE: _21_______/28           PT Education - 05/05/17 1732    Education provided Yes   Education Details POC, progress towards goals and plans for discharge; [employer] class beginning in August   Person(s) Educated Patient   Methods Explanation;Handout   Comprehension Verbalized understanding          PT Short Term Goals - 04/10/17 1238      PT SHORT TERM GOAL #1   Title Pt will be independent with HEP to target Parkinson's specific deficits.   TARGET 04/05/17   Baseline Pt able to perform HEP in therapy sessions, but reports not doing HEP at home.   Time 4   Period Weeks   Status Not Met     PT SHORT TERM GOAL #2   Title Pt will improve TUG score to less than or equal to 13.5 seconds for decreased fall risk.   Baseline 11.75 sec 04/10/17   Time 4   Period Weeks   Status Achieved     PT SHORT TERM GOAL #3   Title Pt will improve SLS to at least 3 seconds bilateral lower extremities for improved stair negotiation and obstacle negotiation.   Baseline <2 seconds bilateral   Time 4   Period Weeks   Status Not Met     PT SHORT TERM GOAL #4   Title Pt will perform 8 of 10 reps of sit<>stand transfers from 18" surfaces and below, with no UE support, no posterior lean, independently.   Time 4   Period Weeks   Status Achieved     PT SHORT TERM GOAL #5   Title Pt will negotiate at least 4 steps, 6 reps, with/without rail, alternating pattern, modified independently for safety with stair negotiation.   Time 4   Period Weeks   Status Achieved           PT Long Term Goals - 05/05/17 1000      PT LONG TERM GOAL #1   Title Pt will verbalize understanding of fall prevention/tips to reduce freezing with gait.  TARGET 05/05/17   Time 8   Period Weeks   Status Achieved     PT LONG TERM GOAL #2   Title Pt will improve TUG cognitive to less than or equal to 15 seconds for decreased fall risk/improved dual tasking with gait.   Time 8   Period Weeks   Status Achieved     PT LONG TERM GOAL #3   Title Pt will improve MiniBESTest to at least 24/28 for decreased fall risk.   Baseline 21.28 05/05/17   Time 8   Period Weeks   Status Not Met     PT LONG TERM GOAL #4   Title Pt will improve gait velocity to at least 2.62 ft/sec for improved gait efficiency and safety.   Baseline 2.69 ft/sec 04/28/17   Time 8   Period Weeks   Status Achieved     PT LONG TERM GOAL #5   Title Pt will verbalize plans for ongoing community fitness upon  D/C from PT.  Time 8   Period Weeks   Status Achieved               Plan - 05-25-17 1734    Clinical Impression Statement Pt has met LTG 1, 2, 4 and 5.  LTG 3 not met, as MiniBEStest score did not change from 21/28.  Pt scored differently on some individual items today versus at eval.  Overall, pt has improved gait velocity, TUG scores, and 5x sit<>stand test.  Pt is now participating in [employer] several times per week.  Have strongly encouraged patient to continued HEP provided through therapy and participate in [employer] exercise program as a way to continue to address PD-specific deficits.  Pt is appropriate for discharge at this time.   Rehab Potential Good   Clinical Impairments Affecting Rehab Potential  short term memory issues per speech eval   PT Frequency 2x / week   PT Duration 8 weeks  plus eval   PT Treatment/Interventions ADLs/Self Care Home Management;Functional mobility training;Stair training;Gait training;Patient/family education;Neuromuscular re-education;Balance training;Therapeutic exercise;Therapeutic activities   PT Next Visit Plan Discharge PT this week; plan for PT eval in 6-9 months.   Consulted and Agree with Plan of Care Patient      Patient will benefit from skilled therapeutic intervention in order to improve the following deficits and impairments:  Abnormal gait, Decreased balance, Decreased mobility, Decreased strength, Difficulty walking, Impaired flexibility, Postural dysfunction, Impaired tone  Visit Diagnosis: Unsteadiness on feet  Other abnormalities of gait and mobility       G-Codes - May 25, 2017 1737    Functional Assessment Tool Used (Outpatient Only) gait velocity 3.59 ft/sec; TUG 12.13 sec, TUG cog 14.44 sec, 5x sit<>stand 9.31 sec; MiniBESTest 21/28   Functional Limitation Mobility: Walking and moving around   Mobility: Walking and Moving Around Goal Status [phone]) At least 20 percent but less than 40 percent impaired,  limited or restricted   Mobility: Walking and Moving Around Discharge Status [phone]) At least 20 percent but less than 40 percent impaired, limited or restricted      Problem List Patient Active Problem List   Diagnosis Date Noted  . Parkinson's disease ([place]) 03/20/2017  . Bilateral lower extremity edema 03/20/2017  . Cerumen impaction 09/13/2016  . Chronic venous insufficiency 09/13/2016  . Fatigue 03/15/2016  . Parkinsonian features 09/15/2015  . ED (erectile dysfunction) 09/15/2015  . MDD (major depressive disorder), recurrent episode, severe ([place]) 02/10/2014  . Nonspecific abnormal electrocardiogram (ECG) (EKG) 02/07/2014  . Non-compliant behavior 02/03/2014  . Morton's neuroma of left foot 08/07/2013  . 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 Hyperglycemia 01/26/2012  . Hypogonadism male 01/24/2012  . Syncope and collapse 01/20/2012  .  OSA (obstructive sleep apnea) 01/17/2011  . CHEST TIGHTNESS 02/18/2008    Bo Rogue W. 05-25-17, 5:39 PM Frazier Butt., PT  [facility] [address] [place], [place], [address] Phone: [phone]   Fax:  [phone]  Name: [name] [name] MRN: [id number] Date of Birth: July 23, 1951  PHYSICAL THERAPY DISCHARGE SUMMARY  Visits from Start of Care: 12  Current functional level related to goals / functional outcomes: See LTGs assessed above; pt has met 4 of 5 LTGs   Remaining deficits: Bradykinesia, slowed gait and mobility, decreased balance   Education / Equipment: HEP, fall prevention, community fitness.  Plan: Patient agrees to discharge.  Patient goals were partially met. Patient is being discharged due to meeting the stated rehab goals.  ?????  Recommend PT return eval in 6 months due to progressive nature of disease.         Mady Haagensen, PT 05/05/17 5:45 PM Phone: [phone] Fax: [phone]

## 2017-05-08 ENCOUNTER — Ambulatory Visit: Payer: Federal, State, Local not specified - PPO | Admitting: Occupational Therapy

## 2017-05-08 ENCOUNTER — Ambulatory Visit: Payer: Federal, State, Local not specified - PPO | Admitting: Physical Therapy

## 2017-05-08 VITALS — BP 137/85 | HR 77

## 2017-05-08 DIAGNOSIS — R278 Other lack of coordination: Secondary | ICD-10-CM

## 2017-05-08 DIAGNOSIS — R41840 Attention and concentration deficit: Secondary | ICD-10-CM

## 2017-05-08 DIAGNOSIS — R293 Abnormal posture: Secondary | ICD-10-CM

## 2017-05-08 DIAGNOSIS — R2681 Unsteadiness on feet: Secondary | ICD-10-CM

## 2017-05-08 DIAGNOSIS — R29898 Other symptoms and signs involving the musculoskeletal system: Secondary | ICD-10-CM

## 2017-05-08 DIAGNOSIS — R2689 Other abnormalities of gait and mobility: Secondary | ICD-10-CM

## 2017-05-08 DIAGNOSIS — R29818 Other symptoms and signs involving the nervous system: Secondary | ICD-10-CM

## 2017-05-08 NOTE — Therapy (Signed)
[facility] [address] [place], [place], [address] Phone: [phone]   Fax:  [phone]  Occupational Therapy Treatment  Patient Details  Name: [name] [name] MRN: [id number] Date of Birth: 01-08-1951 Referring Provider: Dr. [name] Tat   Encounter Date: 05/08/2017      OT End of Session - 05/08/17 0859    Visit Number 12   Number of Visits 17   Date for OT Re-Evaluation 05/06/17   Authorization Type BCBS   Authorization Time Period 75 visit limit combined    Authorization - Visit Number 12   Authorization - Number of Visits 25   OT Start Time [phone]   OT Stop Time 0931   OT Time Calculation (min) 39 min   Activity Tolerance Patient tolerated treatment well   Behavior During Therapy [facility] for tasks assessed/performed      Past Medical History:  Diagnosis Date  .  OSA (obstructive sleep apnea) 01/17/2011   npsg 2012:  AHI 67/hr. Auto titration 2012:  Optimal pressure 12cm.   . APPENDECTOMY, HX OF 02/18/2008   Qualifier: Diagnosis of  By: [place], [place]    . Cardiac murmur    as a child  . Depression   . Headache(784.0)   . HEADACHES, HX OF 02/18/2008   Qualifier: Diagnosis of  By: Danny Lawless CMA, [place]    . Parkinson disease ([place]) 11/2014  . Streptococcal meningitis    as an infant    Past Surgical History:  Procedure Laterality Date  . APPENDECTOMY  1967  . NASAL SINUS SURGERY     x 4 as a child  . VASECTOMY      Vitals:   05/08/17 0854  BP: 137/85  Pulse: 77        Subjective Assessment - 05/08/17 0858    Subjective  just tired   Pertinent History Parkinson's disease, seeing illusions, hx of falls, hx of significant depression, mild cognitive impairment, sleep apnea, hypogonadism, ADD   Limitations orthostatic BP!!! monitor PRN   Patient Stated Goals improved balance and ease with ADLs   Currently in Pain? No/denies       Emphasized importance of HEP and large amplitude movement  strategies to prevent future complications.  Pt verbalized understanding.  Arm bike x 6mn level 1 for reciprocal movement with cues/target of at least 40rpms for intensity while maintaining movement amplitude/reciprocal movement.   Pt maintained 35-42rpms without rest (forward/backwards).  Began checking remaining goals--see status below.    Reviewed buttoning strategy and after cueing pt able to return demo with improved performance.                          OT Education - 05/08/17 [phone]   Education Details Reviewed Pre-Dressing Stretches; PWR! moves (up, rock, twist) review; Reviewed and re-issued handout for [place]  recommended PWR! Moves Class since pt does not complete PWR! HEP consistently on his own   Person(s) Educated Patient   Methods Explanation;Demonstration;Verbal cues   Comprehension Verbalized understanding;Returned demonstration;Verbal cues required          OT Short Term Goals - 05/08/17 0918      OT SHORT TERM GOAL #1   Title Pt will be independent with PD specific  HEP.--check STGs 04/05/17   Time 4   Period Weeks   Status Achieved  stretches prior to dressing issued, pt has not practiced at home.   05/08/17  Pt able to return demo but does not perform consistently at home     OT [place] #2   Title Pt will verbalize understanding of ways to prevent future complications and current available/appropriate community resources prn.   Time 4   Period Weeks   Status Achieved  initiated, needs futher education.  05/08/17  met     OT SHORT TERM GOAL #3   Title Pt will improve bilateral hand coordination as shown by fastening/unfastening 3 buttons in 50sec or less.   Time 4   Period Weeks   Status Achieved  04/24/17:  49.22sec with min cueing for use of strategies     OT SHORT TERM GOAL #4   Title Pt will report incr ease with donning socks/shoes using adaptive strategies/AE prn.   Time 4    Status On-going  Pt has not attempted stretches at home., Pt demonstrates ability to perform in clinic     OT SHORT TERM GOAL #5   Title Pt will demonstrate ability to write a 3 sentences with 100% legibility and only min decrease in letter size.   Time 4   Period Weeks   Status Achieved  04/24/17:  100% legibilty, no significant decr in size.           OT Long Term Goals - 05/08/17 0925      OT LONG TERM GOAL #1   Title Pt will verbalize understanding of adapted strategies for ADLs/IADLs--check LTGs 05/06/17   Time 8   Period Weeks   Status On-going     OT LONG TERM GOAL #2   Title Pt will demonstrate improved functional standing balance as evidenced by increasing LUE standing functional reach to 10 inches or greater   Baseline RUE 10 in, LUE 9 in   Time 8   Period Weeks   Status Not Met  05/08/17:  R-9inch, L-10inches     OT LONG TERM GOAL #3   Title Pt will improve bilateral hand coordination as shown by fastening/unfastening 3 buttons in 45sec or less.   Baseline 61.60sec   Time 8   Period Weeks   Status Partially Met  05/08/17:  55.85sec (without cueing), 39.75sec (with min cues for strategies)--pt not consistent, only met with cues     OT LONG TERM GOAL #4   Title Pt will be able to carry object in both hands when ambulating with direction changes and simple cognitive component without freezing for incr ease/safety with IADLs.   Baseline ----   Time 8   Period Weeks   Status New     OT LONG TERM GOAL #5   Title Pt will improve functional reaching/coordination for ADLs as shown by improving score on box and blocks test by at least 4 blocks bilaterally   Baseline 46 bilaterally   Time 8   Period Weeks   Status Achieved  05/08/17:  R-52blocks, L-55blocks     OT LONG TERM GOAL #6   Title Pt will improve coordination of dominant R hand for ADLs as shown by improving time on 9-hole peg test by at least 4sec.   Baseline R-32.69sec, L-29.85sec   Time 8   Period Weeks    Status Not Met  04/24/17:  32.66sec, 31.75sec.  05/08/17:  R-36.22sec, L-32.85sec               Plan - 05/08/17 1908    Clinical Impression Statement Emphasized importance of performing HEP in addition to community fitness activities, particularly on  days that he doesn't go to class/gym.  Pt reports that he is unable to participate in PWR! 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     Rehab Potential Good   Current Impairments/barriers affecting progress: cognitive deficits- Pt is having orthostatic BP, monitor PRN   OT Frequency 2x / week   OT Duration 8 weeks   OT Treatment/Interventions Self-care/ADL training;Therapeutic exercise;Cognitive remediation/compensation;Visual/perceptual remediation/compensation;Neuromuscular education;Parrafin;Moist Heat;Fluidtherapy;Energy conservation;Therapeutic exercises;Patient/family education;Balance training;Therapeutic activities;Passive range of motion;Manual Therapy;DME and/or AE instruction;Ultrasound;Cryotherapy;Functional Mobility Training   Plan continue to check remaining goal, anticipate d/c   OT Home Exercise Plan Education provided:  stretches prior to dressing, strategies for buttoning, ex chart   Consulted and Agree with Plan of Care Patient      Patient will benefit from skilled therapeutic intervention in order to improve the following deficits and impairments:  Decreased cognition, Impaired flexibility, Decreased mobility, Decreased coordination, Decreased endurance, Decreased range of motion, Decreased strength, Impaired UE functional use, Impaired tone, Impaired perceived functional ability, Decreased safety awareness, Difficulty walking, Decreased balance, Decreased activity tolerance, Impaired vision/preception, Improper spinal/pelvic alignment  Visit Diagnosis: Other symptoms and signs involving the musculoskeletal system  Other abnormalities of gait and mobility  Other symptoms and signs involving the nervous  system  Abnormal posture  Other lack of coordination  Attention and concentration deficit  Unsteadiness on feet    Problem List Patient Active Problem List   Diagnosis Date Noted  . Parkinson's disease ([place]) 03/20/2017  . Bilateral lower extremity edema 03/20/2017  . Cerumen impaction 09/13/2016  . Chronic venous insufficiency 09/13/2016  . Fatigue 03/15/2016  . Parkinsonian features 09/15/2015  . ED (erectile dysfunction) 09/15/2015  . MDD (major depressive disorder), recurrent episode, severe ([place]) 02/10/2014  . Nonspecific abnormal electrocardiogram (ECG) (EKG) 02/07/2014  . Non-compliant behavior 02/03/2014  . Morton's neuroma of left foot 08/07/2013  . 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 Hyperglycemia 01/26/2012  . Hypogonadism male 01/24/2012  . Syncope and collapse 01/20/2012  .  OSA (obstructive sleep apnea) 01/17/2011  . CHEST TIGHTNESS 02/18/2008    [facility] 05/08/2017, 7:20 PM  [facility] [address] [place], [place], [address] Phone: [phone]   Fax:  [phone]  Name: [name] [name] MRN: [id number] Date of Birth: [birth year]   Vianne Bulls, OTR/L [facility] [address] [place], [place]  [address] [phone] phone [phone] 05/08/17 7:20 PM

## 2017-05-12 ENCOUNTER — Ambulatory Visit: Payer: Self-pay | Admitting: Physical Therapy

## 2017-05-12 ENCOUNTER — Encounter: Payer: Self-pay | Admitting: Occupational Therapy

## 2017-05-16 ENCOUNTER — Ambulatory Visit: Payer: Federal, State, Local not specified - PPO | Admitting: Occupational Therapy

## 2017-05-16 ENCOUNTER — Ambulatory Visit: Payer: Federal, State, Local not specified - PPO | Admitting: *Deleted

## 2017-05-16 DIAGNOSIS — R278 Other lack of coordination: Secondary | ICD-10-CM

## 2017-05-16 DIAGNOSIS — R293 Abnormal posture: Secondary | ICD-10-CM

## 2017-05-16 DIAGNOSIS — R29818 Other symptoms and signs involving the nervous system: Secondary | ICD-10-CM

## 2017-05-16 DIAGNOSIS — R2689 Other abnormalities of gait and mobility: Secondary | ICD-10-CM

## 2017-05-16 DIAGNOSIS — R41840 Attention and concentration deficit: Secondary | ICD-10-CM

## 2017-05-16 DIAGNOSIS — R2681 Unsteadiness on feet: Secondary | ICD-10-CM

## 2017-05-16 DIAGNOSIS — R29898 Other symptoms and signs involving the musculoskeletal system: Secondary | ICD-10-CM

## 2017-05-16 NOTE — Therapy (Signed)
[facility] [address] [place], [place], [address] Phone: [phone]   Fax:  [phone]  Occupational Therapy Treatment  Patient Details  Name: [name] [name] MRN: [id number] Date of Birth: 1951/06/24 Referring Provider: Dr. [name] Tat   Encounter Date: 05/16/2017      OT End of Session - 05/16/17 1551    Visit Number 13   Number of Visits 17   Date for OT Re-Evaluation 05/06/17   Authorization Type BCBS   Authorization Time Period 75 visit limit combined    Authorization - Visit Number 13   Authorization - Number of Visits 25   OT Start Time 1537   OT Stop Time 1617   OT Time Calculation (min) 40 min   Activity Tolerance Patient tolerated treatment well   Behavior During Therapy [facility] for tasks assessed/performed      Past Medical History:  Diagnosis Date  .  OSA (obstructive sleep apnea) 01/17/2011   npsg 2012:  AHI 67/hr. Auto titration 2012:  Optimal pressure 12cm.   . APPENDECTOMY, HX OF 02/18/2008   Qualifier: Diagnosis of  By: [place], [place]    . Cardiac murmur    as a child  . Depression   . Headache(784.0)   . HEADACHES, HX OF 02/18/2008   Qualifier: Diagnosis of  By: Danny Lawless CMA, [place]    . Parkinson disease ([place]) 11/2014  . Streptococcal meningitis    as an infant    Past Surgical History:  Procedure Laterality Date  . APPENDECTOMY  1967  . NASAL SINUS SURGERY     x 4 as a child  . VASECTOMY      There were no vitals filed for this visit.      Subjective Assessment - 05/16/17 1540    Subjective  Pt went to gym today.   Pertinent History Parkinson's disease, seeing illusions, hx of falls, hx of significant depression, mild cognitive impairment, sleep apnea, hypogonadism, ADD   Limitations orthostatic BP!!! monitor PRN   Patient Stated Goals improved balance and ease with ADLs   Currently in Pain? No/denies          Arm bike x 26mn level 1 for reciprocal movement  with cues/target of at least 40rpms for intensity while maintaining movement amplitude/reciprocal movement.   Pt maintained 35-40rpms (forward/backwards).  PWR!/big walking with min-mod cueing for opening hands, arm swing, large amplitude steps, posture.  Ambulating while carrying cup/plate in each hand with direction changes, in small spaces, and with conversation for simple cognitive component.  Pt did well with no freezing episodes today.  Discussed posture and incr awareness of head position followed by practicing walking with nod alert to incr awareness of head position.  Pt interested in device and was given info on where to purchase.  Checked remaining goals and discussed progress.                     OT Education - 05/16/17 1657    Education Details Recommendations for how to incorporate into daily routine (perform at least 1 set a day in addition to comunity exercise, perform coordination HEP during commericals while watching tv, perform PWR! hands in car).  Reviewed ways to prevent future complications including shoulder pain.   Person(s) Educated Patient   Methods Explanation   Comprehension Verbalized understanding  pt verbalized agreement.          OT Short Term Goals - 05/16/17 1656      OT  SHORT TERM GOAL #1   Title Pt will be independent with PD specific  HEP.--check STGs 04/05/17   Time 4   Period Weeks   Status Achieved  stretches prior to dressing issued, pt has not practiced at home.   05/08/17  Pt able to return demo but does not perform consistently at home     OT [place] #2   Title Pt will verbalize understanding of ways to prevent future complications and current available/appropriate community resources prn.   Time 4   Period Weeks   Status Achieved  initiated, needs futher education.  05/08/17  met     OT SHORT TERM GOAL #3   Title Pt will improve bilateral hand coordination as shown by fastening/unfastening 3 buttons in 50sec or less.    Time 4   Period Weeks   Status Achieved  04/24/17:  49.22sec with min cueing for use of strategies     OT SHORT TERM GOAL #4   Title Pt will report incr ease with donning socks/shoes using adaptive strategies/AE prn.   Time 4   Status Partially Met  Pt has not attempted stretches at home., Pt demonstrates ability to perform in clinic.  05/16/17  Pt reports continued difficulty, but recommendations do help some     OT SHORT TERM GOAL #5   Title Pt will demonstrate ability to write a 3 sentences with 100% legibility and only min decrease in letter size.   Time 4   Period Weeks   Status Achieved  04/24/17:  100% legibilty, no significant decr in size.           OT Long Term Goals - 05/16/17 1657      OT LONG TERM GOAL #1   Title Pt will verbalize understanding of adapted strategies for ADLs/IADLs--check LTGs 05/06/17   Time 8   Period Weeks   Status Achieved     OT LONG TERM GOAL #2   Title Pt will demonstrate improved functional standing balance as evidenced by increasing LUE standing functional reach to 10 inches or greater   Baseline RUE 10 in, LUE 9 in   Time 8   Period Weeks   Status Not Met  05/08/17:  R-9inch, L-10inches     OT LONG TERM GOAL #3   Title Pt will improve bilateral hand coordination as shown by fastening/unfastening 3 buttons in 45sec or less.   Baseline 61.60sec   Time 8   Period Weeks   Status Partially Met  05/08/17:  55.85sec (without cueing), 39.75sec (with min cues for strategies)--pt not consistent, only met with cues     OT LONG TERM GOAL #4   Title Pt will be able to carry object in both hands when ambulating with direction changes and simple cognitive component without freezing for incr ease/safety with IADLs.   Baseline ----   Time 8   Period Weeks   Status Achieved  05/16/17     OT LONG TERM GOAL #5   Title Pt will improve functional reaching/coordination for ADLs as shown by improving score on box and blocks test by at least 4 blocks  bilaterally   Baseline 46 bilaterally   Time 8   Period Weeks   Status Achieved  05/08/17:  R-52blocks, L-55blocks     OT LONG TERM GOAL #6   Title Pt will improve coordination of dominant R hand for ADLs as shown by improving time on 9-hole peg test by at least 4sec.   Baseline R-32.69sec,  L-29.85sec   Time 8   Period Weeks   Status Not Met  04/24/17:  32.66sec, 31.75sec.  05/08/17:  R-36.22sec, L-32.85sec               Plan - 05/16/17 1654    Clinical Impression Statement Pt continues to struggle with consistency with HEP, but has incr community exercise activities.  Made additional recommendations today regarding how to incr carryover.     Rehab Potential Good   Current Impairments/barriers affecting progress: cognitive deficits- Pt is having orthostatic BP, monitor PRN   OT Frequency 2x / week   OT Duration 8 weeks   OT Treatment/Interventions Self-care/ADL training;Therapeutic exercise;Cognitive remediation/compensation;Visual/perceptual remediation/compensation;Neuromuscular education;Parrafin;Moist Heat;Fluidtherapy;Energy conservation;Therapeutic exercises;Patient/family education;Balance training;Therapeutic activities;Passive range of motion;Manual Therapy;DME and/or AE instruction;Ultrasound;Cryotherapy;Functional Mobility Training   Plan d/c OT   OT Home Exercise Plan Education provided:  stretches prior to dressing, strategies for buttoning, ex chart   Consulted and Agree with Plan of Care Patient      Patient will benefit from skilled therapeutic intervention in order to improve the following deficits and impairments:  Decreased cognition, Impaired flexibility, Decreased mobility, Decreased coordination, Decreased endurance, Decreased range of motion, Decreased strength, Impaired UE functional use, Impaired tone, Impaired perceived functional ability, Decreased safety awareness, Difficulty walking, Decreased balance, Decreased activity tolerance, Impaired  vision/preception, Improper spinal/pelvic alignment  Visit Diagnosis: Other symptoms and signs involving the musculoskeletal system  Other symptoms and signs involving the nervous system  Abnormal posture  Other lack of coordination  Attention and concentration deficit  Unsteadiness on feet  Other abnormalities of gait and mobility    Problem List Patient Active Problem List   Diagnosis Date Noted  . Parkinson's disease ([place]) 03/20/2017  . Bilateral lower extremity edema 03/20/2017  . Cerumen impaction 09/13/2016  . Chronic venous insufficiency 09/13/2016  . Fatigue 03/15/2016  . Parkinsonian features 09/15/2015  . ED (erectile dysfunction) 09/15/2015  . MDD (major depressive disorder), recurrent episode, severe ([place]) 02/10/2014  . Nonspecific abnormal electrocardiogram (ECG) (EKG) 02/07/2014  . Non-compliant behavior 02/03/2014  . Morton's neuroma of left foot 08/07/2013  . 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 Hyperglycemia 01/26/2012  . Hypogonadism male 01/24/2012  . Syncope and collapse 01/20/2012  .  OSA (obstructive sleep apnea) 01/17/2011  . CHEST TIGHTNESS 02/18/2008    OCCUPATIONAL THERAPY DISCHARGE SUMMARY  Visits from Start of Care: 13  Current functional level related to goals / functional outcomes: See above   Remaining deficits: Bradykinesia, rigidity, decr coordination, abnormal posture, decr balance/functional mobility for ADLs, cognitive deficits.   Education / Equipment: Pt was instructed in the following:  PD-specific HEP, adaptive strategies for ADLs/IADLs, ways to prevent future complications, appropriate community resources.  Pt verbalized understanding of all education provided.    Plan: Patient agrees to discharge.  Patient goals were partially met. Patient is being discharged due to                                                     Reaching maximal rehab potential at this time.  Pt would benefit  from re-evaluation in approx 6 months to assess for need for further therapy/functional changes due to progressive nature of diagnosis.  ?????             [facility] 05/16/2017, 4:59 PM  [facility] [address] [place], [place], [address] Phone: [phone]   Fax:  [phone]  Name: OLAOLUWA GRIEDER MRN: [id number] Date of Birth: 1951/05/04   Vianne Bulls, OTR/L [facility] [address] [name], [place]  [address] [phone] phone [phone] 05/16/17 4:59 PM

## 2017-05-24 ENCOUNTER — Telehealth (HOSPITAL_COMMUNITY): Payer: Self-pay

## 2017-05-24 NOTE — Telephone Encounter (Signed)
Medication management - Telephone call with Ron, pharmacist at [facility] in [facility] on [address] [facility] [address] to verify Dr. Marguerite Olea order for Bupropion XL 300mg  daily, #90 with 0 refills from 05/18/17 order.  Pharmacist to fill order as provided by Dr. Adele Schilder 05/18/17.

## 2017-06-09 ENCOUNTER — Ambulatory Visit (HOSPITAL_COMMUNITY): Payer: Self-pay | Admitting: Psychiatry

## 2017-06-19 ENCOUNTER — Ambulatory Visit (INDEPENDENT_AMBULATORY_CARE_PROVIDER_SITE_OTHER): Payer: Federal, State, Local not specified - PPO | Admitting: Psychiatry

## 2017-06-19 ENCOUNTER — Encounter (HOSPITAL_COMMUNITY): Payer: Self-pay | Admitting: Psychiatry

## 2017-06-19 DIAGNOSIS — F332 Major depressive disorder, recurrent severe without psychotic features: Secondary | ICD-10-CM | POA: Diagnosis not present

## 2017-06-19 DIAGNOSIS — Z813 Family history of other psychoactive substance abuse and dependence: Secondary | ICD-10-CM

## 2017-06-19 DIAGNOSIS — Z811 Family history of alcohol abuse and dependence: Secondary | ICD-10-CM

## 2017-06-19 MED ORDER — CITALOPRAM HYDROBROMIDE 40 MG PO TABS: 40.0000 mg | ORAL_TABLET | Freq: Every day | ORAL | 0 refills | Status: DC

## 2017-06-19 MED ORDER — BUPROPION HCL ER (XL) 300 MG PO TB24: ORAL_TABLET | ORAL | 0 refills | Status: DC

## 2017-06-19 NOTE — Progress Notes (Signed)
BH MD/PA/NP OP Progress Note  06/19/2017 8:13 AM [name] [name]  MRN:  [id number]  Chief Complaint:  my depression is a stable.  I think my Parkinson is getting worse.  HPI: [name] [name] came for his follow-up appointment with his wife.  He is taking Celexa and Wellbutrin and denies any side effects.  Lately he has noticed increase in his Parkinson symptoms.  He has muscle stiffness and shuffling gait.  Though he is going to boxing club regularly and also trying to keep himself busy by doing exercise but he feel that his tremors are not getting better.  He also having some time visual hallucinations but he know it could be the side effects of medication and he is able to distract these hallucinations.  He sleeping good.  He denies any crying spells or any feeling of hopelessness or worthlessness.  His appetite is good.  He gained weight 5 pounds from the past.  Sometime he has difficulty walking and he stopped driving because his memory is not as good as it used to be.  Sometime he watch TV or listen to the music.  He denies any delusions or any paranoia.  He lives with his wife who is very supportive and usually bring him to the appointments.  Patient has appointment to see his neurologist next month.  He is taking L-dopa which is helping his muscle stiffness but he started having visual hallucinations.  Patient denies any suicidal thoughts or homicidal thought.  He like to continue his Wellbutrin and Celexa.  Patient denies drinking alcohol or using any illegal substances.  His energy level is fair.  Visit Diagnosis:    ICD-10-CM   1. 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 Patient denies any history of suicidal attempt or any inpatient psychiatric treatment. He denies any history of mania, psychosis, paranoia or any hallucination. In the  past he had tried Effexor, Abilify, Paxil, Cymbalta, Zoloft, amitriptyline, Pristiq, Britnellex, Celexa, Deplin, Vyvanse and Latuda. He is seen in this office since September 2009. He has seen multiple psychiatrists in the past.   Past Medical History:  Past Medical History:  Diagnosis Date  .  OSA (obstructive sleep apnea) 01/17/2011   npsg 2012:  AHI 67/hr. Auto titration 2012:  Optimal pressure 12cm.   . APPENDECTOMY, HX OF 02/18/2008   Qualifier: Diagnosis of  By: [place], [place]    . Cardiac murmur    as a child  . Depression   . Headache(784.0)   . HEADACHES, HX OF 02/18/2008   Qualifier: Diagnosis of  By: Danny Lawless CMA, [place]    . Parkinson disease ([place]) 11/2014  . Streptococcal meningitis    as an infant    Past Surgical History:  Procedure Laterality Date  . APPENDECTOMY  1967  . NASAL SINUS SURGERY     x 4 as a child  . VASECTOMY      Family Psychiatric History: Reviewed.  Family History:  Family History  Problem Relation Age of Onset  . Lung cancer Father   . Alcohol abuse Brother   . Drug abuse Brother   . Seizures Daughter     Social History:  Social History   Social History  . Marital status: Married    Spouse name: N/A  . Number of children: Y  . Years of education: N/A  Occupational History  . 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 Smoking status: Never Smoker  . Smokeless tobacco: Never Used  . Alcohol use No  . Drug use: No  . Sexual activity: Yes    Birth control/ protection: None   Other Topics Concern  . None   Social History Narrative  . None    Allergies: No Known Allergies  Metabolic Disorder Labs: Lab Results  Component Value Date   HGBA1C 5.9 09/13/2016   No results found for: PROLACTIN Lab Results  Component Value Date   CHOL 206 (H) 03/14/2016   TRIG 121.0 03/14/2016   HDL 53.90 03/14/2016   CHOLHDL 4 03/14/2016   VLDL 24.2 03/14/2016   LDLCALC 127 (H) 03/14/2016   LDLCALC  105 (H) 03/16/2015   Lab Results  Component Value Date   TSH 1.16 03/14/2016   TSH 0.80 02/06/2014    Therapeutic Level Labs: No results found for: LITHIUM No results found for: VALPROATE No components found for:  CBMZ  Current Medications: Current Outpatient Prescriptions  Medication Sig Dispense Refill  . B Complex-C (B-COMPLEX WITH VITAMIN C) tablet Take 1 tablet by mouth daily.    Marland Kitchen buPROPion (WELLBUTRIN XL) 300 MG 24 hr tablet TAKE 1 TABLET BY MOUTH EVERY DAY IN THE MORNING 90 tablet 0  . carbidopa-levodopa (SINEMET IR) 25-100 MG tablet Take 1 tablet by mouth 3 (three) times daily. 270 tablet 1  . citalopram (CELEXA) 40 MG tablet Take 1 tablet (40 mg total) by mouth daily. 90 tablet 0  . fesoterodine (TOVIAZ) 4 MG TB24 tablet Take 1 tablet (4 mg total) by mouth daily. 30 tablet 0  . Omega 3-6-9 Fatty Acids (OMEGA 3-6-9 PO) Take by mouth.    Marland Kitchen rOPINIRole (REQUIP) 2 MG tablet TAKE 2 TABLETS IN THE AM, 1 IN THE AFTERNOON AND 1 IN THE EVENING 360 tablet 1  . TURMERIC CURCUMIN PO Take by mouth.     No current facility-administered medications for this visit.      Musculoskeletal: Strength & Muscle Tone: decreased Gait & Station: shuffle Patient leans: Front and Backward  Psychiatric Specialty Exam: ROS  Blood pressure 132/76, pulse 92, height 6\' 2"  (1.88 m), weight 295 lb 3.2 oz (133.9 kg).Body mass index is 37.9 kg/m.  General Appearance: Casual  Eye Contact:  Fair  Speech:  Slow  Volume:  Decreased  Mood:  Euthymic  Affect:  Constricted and Restricted  Thought Process:  Goal Directed  Orientation:  Full (Time, Place, and Person)  Thought Content: Hallucinations: Visual   Suicidal Thoughts:  No  Homicidal Thoughts:  No  Memory:  Immediate;   Fair Recent;   Fair Remote;   Fair  Judgement:  Good  Insight:  Good  Psychomotor Activity:  Decreased, Shuffling Gait and Tremor  Concentration:  Concentration: Fair and Attention Span: Fair  Recall:  [employer] of  Knowledge: Good  Language: Good  Akathisia:  No  Handed:  Right  AIMS (if indicated): not done  Assets:  Communication Skills Desire for Improvement Intimacy Resilience Social Support  ADL's:  Intact  Cognition: Impaired,  Mild  Sleep:  Good   Screenings:   Assessment and Plan: Major depressive disorder, recurrent.  Patient is a stable on his antidepressant.  He is concerned about his tremors and slowly decline in his memory.  He does not want to change his antidepressant and he is not interested in counseling.  I  will continue Celexa 40 mg daily and Wellbutrin XL 300 mg daily.  Patient has appointment to see his neurologist next month.  Encouraged to keep that appointment.  Encourage neurocognitive exercise.  Recommended to call us back if he has any question, concern or if he feel worsening of the symptom.  Follow-up in 3 months.   Leandre Wien T., MD 06/19/2017, 8:13 AM

## 2017-07-14 NOTE — Progress Notes (Signed)
[name] [name] was seen today in the movement disorders clinic for neurologic consultation at the request of [name], MD.  The consultation is for the evaluation of slowness, flat affect and to r/o a neurologic disorder.  This patient is accompanied in the office by his spouse who supplements the history.  The first symptom(s) the patient noticed was a feeling of weakness that has been going on for about a year.  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  For about a year and a half he has noted a "body" tremor and his wife has noted a hand tremor in both hands (at rest).  He is stiff like he is "a 66 year old man."  Once he is in bed he finds it is hard to move.  The patient has a hx of significant depression and is on multiple medications related to this.  He was started on cogentin just yesterday, but is also on celexa, wellbutrin and latuda.  He has been on [place] for 1.5 months.  Prior to that he was on abilify (tried on 2 separate times but most recently was only on it for a day) and geodon(doesn't remember this but looks like it may have been given in the past).  He has been on risperdal but cannot remember how long ago.  ECT was offered but his wife does not want him to proceed with that  11/20/14 update:  The patient returns today for follow-up.  He is accompanied by his wife who supplements the history.  He has been off of [place] for about 2 weeks.  Unfortunately, he was unable to afford the dat scan.  He is on just Wellbutrin and Celexa for depression and his Cogentin and has been discontinued.  He has noted a decreased appetite and dry mouth and having trouble sleeping because of stiffness.  Pt states that he is almost afraid to go to sleep.  Missing 2 days of work per week (works nights) but a lot of that because of anxiety.  His wife notices increased tremor.  He has slow at work.  Admits to some  lightheadedness but no syncope.  Note diplopia.  No hallucinations.  His wife is doing most of the driving because he is having difficulty staying in his own lane.  02/19/15 update:  The patient is following up today regarding his parkinsonism.  I reviewed records from his psychiatrist since last visit.  His psychiatry note from 12/04/14 indicates that the patient stated that tremor was better.  However, I received a call 4 days later stating that tremor was worse and he thought it was from the Mirapex.  He wanted to change the Mirapex to something else.  He was on Mirapex 0.5 mg 3 times per day at the time.  We ended up switching it to Requip 1 mg 3 times a day.  Fortunately he is doing better.  He denies any side effects with the Requip.  He currently takes it at 8 AM/2 PM/8 PM.  He does state that he notices that he "shuffles with the left leg."  He has not fallen but sometimes feels off balance.  He states that he finished his physical/occupational/speech therapy and feels that it went well.  He is not exercising on his own.  He initially states that he does not feel that he has time, but also states that he does not  go into work until 2 PM and works until 10:30 PM.  This is a change for him.  He was able to change his job at [employer] and seems like that is doing better.  His wife mentions that he continues to have memory loss that she thinks has increased, but she also states that she has noticed memory loss ever since starting on antidepressants.  He had one fall since last visit.  This was when he was taking the trash to the street.  He had no fractures with this, just a superficial scrape.    05/25/15 update:  The patient presents today, accompanied by his wife who supplements the history.  His Requip was increased last visit so that was taking 2 tablets in the morning, one in afternoon and 2 in the evening and 1 day after I started this, I received a call from his wife that the patient did not  tolerate it and it made symptoms worse (which is what they reported with Mirapex).  While that was very odd to me, I told them to go back down to 1 mg 3 times per day.  He actually comes back on 1 mg, 1 in the AM, 2 in the afternoon and 1 in the evening.  He reports that he is doing worse.  He has more tremor and he feels tired in the evening.  Pts wife asks about trying to go back up on the requip slowly.   He had neuropsych testing in June at [facility].  I reviewed that.  There was no evidence of dementia.  There was evidence of mild cognitive impairment from Parkinson's disease as well as from chronic major depressive disorder.  It is recommended that the patient consider transcranial magnetic stimulation for depression, and he told the neuropsychologist that he considered in the past and opted against it.  She encouraged him to attend a Parkinson's support group and he told her that he tried that once and it was not helpful.  He still c/o "my memory has been terrible and especially over the last few weeks."  He feels that he is not depressed - "that is well controlled."  He is not doing CV exercise.  He is still in his new job but it seems more physical than his old job and he thinks he is not as fast in getting tasks done.  He asks about DBS.  08/25/15 update:  The patient is following up today, accompanied by his wife who supplements the history.  I have reviewed records since our last visit.  He is on Requip XL, which was increased to 6 mg at night last visit.  He changed that back to 2 mg tid of the regular ropinirole because of cost.   He is enrolled in physical therapy.  "I'm doing bad."  When asked to describe how he feels bad, he has trouble describing it.  He had a root canal and hasn't felt well since.  He hasn't been back to work for 4 weeks.  He feels that his speech is low and states that he went to voice therapy 2 weeks ago.  He isn't practicing the voice therapy faithfully.  He is  riding the bike on the highest resistance 20 mins a day.  He is having word finding trouble.   No falls but feels that he is "weaker."  He asks me about going to "light duty" at work and possibly retirement in the near future.  He  continues to see psychiatry for his depression.  He last saw psychiatry on 06/04/2015.  No changes were made in medication as he stated that mood was good and continues to reaffirm that.    11/30/15 update:  The patient follows up today, accompanied by his wife who supplements the history.  Last visit, his ropinirole was increased to 4 mg in the morning (8am but goes back to bed for another 1 hour or so), another 2 mg in the afternoon (1-2 pm) and evening (8-8:30pm).  I changed him to light duty at work last visit at his request.  He called me recently and wanted changed from being able to work 4 hours per day to a total of only 10 hours per week.  I told him that he would need a functional capacity evaluation for this, as I was not sure that this made sense from a Parkinson standpoint, given his stage of Parkinson's disease.  He was fairly frustrated with this recommendation, but I told him that I needed some objective evidence.  He reports that he is awaiting an appt for the FCE (has called).   He has been seeing psychiatry and I  actually received a call from his psychiatrist back in December that his psychiatrist felt that he needed to be on levodopa.  I did not think that was the case, but I was willing to do a levodopa challenge to see if that was the case.  He was scheduled to have that done, but the patient canceled that.  Does report that he thinks that depression under good control.   He denies falls.  Some lightheadedness when first gets up, but no near syncope.  Using bike daily for 15-20 minutes.  No hallucinations; rare visual distortions.  Wife thinks that he seems more stiff and has involuntary "tics and shaking" at night.  Waking up with AM headache.  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  03/29/16 update:  The patient follows up today, accompanied by his wife who supplements the history.  Last visit, his ropinirole was increased to 4 mg in the morning, 2 mg in the afternoon and 2 mg in the evening.  Has intermittent spells of increased stiffness, slower on stairs.  Riding recumbant bike at home.  He has been seeing psychiatry and is still on celexa and wellbutrin.  I reviewed last records with Dr. Adele Schilder and pt continued to c/o memory issues (long standing c/o) and was told to discuss with me as may be PD related.  Pt had neuropsych testing done about a year ago and demonstrated no neurodegenerative memory change related to parkinsons but did feel that depression was an issue affecting cognition.  Thinks that memory is worse. No falls since last visit.  He has attended PT/OT/ST from March-May.  He c/o drooling.  Also c/o talking at night and some tremor at night.  Happens most night.  Also c/o snoring and wife thinks that is due to weight and trying to cut back on sugars.  Has OSAS but doesn't use the CPAP.  Has some visual distortions but no hallucinations.  Can be frightening.  Having some urinary incontinence.  Saw urology in the past but not for incontinence.    05/30/16 update:  The patient follows up today, accompanied by his wife who supplements the history.  He is on ropinirole, 4 mg in the morning, 2 mg in the afternoon and 2 mg in the evening.  He denies compulsive behaviors.  He denies sleep attacks.  He is now attending the ACT gym and feels he is getting benefit from this.  He is getting scholarship information.  He had repeat neuropsych testing on August 2 with a follow-up with Dr. Si Raider on August 10.  This was largely consistent with his performance one year prior.  There is mild cognitive impairment consistent with Parkinson's disease and untreated obstructive sleep apnea syndrome.  There was evidence of major depressive disorder,  and Dr. Si Raider felt that there was long-standing personality issues that influenced the patient's view of the world in a negative fashion.  He saw urology since last visit and I did get a note from them.  It was not clear what medication he was tried on, just stated that he tried a beta 3 agonist (?  Myrbetriq).  Pt states that it was in fact myrbetriq that was added.  Pt states that it hasn't helped but wife thinks that he isn't getting up as much during the night.  He was getting up 3 times during the night and now he may get up 1-2 times during the night.  Having some dizziness when gets up for about 2 min.  Drinking 2 L of water a day.  Given up sugar completely.  I did order a split night PSG last time but results are not available.  He c/o increased drooling.  09/14/16 update:  Patient follows up today, accompanied by his wife who supplements the history.  He is on ropinirole, 4 mg in the morning, 2 mg in the afternoon and 2 mg in the evening.  Pt had one fall on the ice when it snowed but that was it.  Pt denies lightheadedness, near syncope.  No hallucinations.  Mood has been good.  He remains on a combination of Wellbutrin and Celexa for depression and thinks that this has been well controlled.  He saw psychiatry since our last visit.  He was referred to psychology for counseling.  He had a functional capacity evaluation on 09/01/2016.  Stated that the patient could exert up to 20 pounds of force occasionally and up to 10 pounds of force frequently, but it also stated that physical demand requirements are in excess of those for sedentary work.  It stated that any job that the patient should engage in should be rated "light work" when it requires walking or standing to a significant degree, when it requires sitting most of the time but entails pushing and/or pulling of leg or arm controls and/or when the job requires working at a production rate paced entailing constant pushing and/or pulling a materials  even if the weight of his materials is negligible.  It did state that based on the evaluation, the client was capable of sustaining light level of work for an 8 hour per day, 40 hour per week job.  Asks me about drooling.  Does not want to try botox.  Asks me about cough and swollen ankles.  C/o talking in sleep.  Will waken him out sleep.  Having trouble getting in and out of the cars (not so much his truck)..  Hits his head on the door jam.  Started PT last week.  Still very sleepy during the day.    12/06/16 update:  Patient has up today, accompanied by his wife who supplements the history.  Patient remains on ropinirole, 4 mg in the morning, 2 mg in the afternoon and 2 mg in the evening. Having  some illusions but once gets close on the object he will realize what it is.  Wife notes some increased tremor but patient doesn't.   He has 2 falls since our last visit.  One he tripped over a curb and the other he fell down stairs but he was close to the bottom and didn't get hurt.  He is working out and doing exercises.  He has issues with drooling, but does not want to pursue Myobloc, nor does he want Robinul.  He did attend rehabilitation since our last visit and I reviewed those records.  Still going to ACT bid.  Feels like speech is slurring.   04/10/17 update:  Patient seen today in follow-up, accompanied by his wife who supplements the history.  Patient is on ropinirole, 4 mg in the morning, 2 mg in the afternoon and 2 mg in the evening.  C/o ankle swelling.  Wondered if it was the [place].  I wanted to start him on carbidopa/levodopa 25/100, one tablet 3 times per day for last visit but he decided to hold on that. Interestingly, he saw Dr. Adele Schilder in June and I reviewed those records.  The patient had reported to him that he felt more stiff and tremulous and according to notes the psychiatrist told him that he thought he needed levodopa.   I was contacted by physical therapy that he was orthostatic in therapy.  He  came to our office for orthostatics and was not orthostatic that day.  We did give him a prescription for the abdominal compression Binder.  He isn't using that and didn't fill the RX.  We told him to increase hydration.  He is drinking 2 liters per day.  Is starting RSB today in [place].    07/17/17 update:  Patient seen today in follow-up for parkinsonism.  He is accompanied by his wife who supplements the history.  He is on ropinirole, 4 mg in the morning, 2 mg in the afternoon and 2 mg in the evening.  Last visit, we started carbidopa/levodopa 25/100, one tablet 3 times per day.  He has attended rehabilitation therapy since our last visit and those notes are reviewed.   He is still going to ACT gym and RSB.  Despite that, he feels weak and doesn't feel as strong as he was before last visit.  He doesn't describe it as dizziness.  He states that he doesn't have an opinion on efficacy of levodopa but wife thinks that it has been beneficial for tremor.  He has a fear of falling and that will slow his movement.  He has not had any actual falls.  Noted swelling of feet and ankles since august.  Some trouble with bladder incontinence and noting early issues with bowels as well.  States that his memory has gotten worse and forgot to take his medication this AM.   He continues to follow with psychiatry.  He is on Wellbutrin and Celexa.  Neuroimaging has previously been performed.  It is available for my review today and I reviewed it with him.  There is no BG disease.  ALLERGIES:  No Known Allergies  CURRENT MEDICATIONS:  Outpatient Encounter Prescriptions as of 07/17/2017  Medication Sig  . B Complex-C (B-COMPLEX WITH VITAMIN C) tablet Take 1 tablet by mouth daily.  Marland Kitchen buPROPion (WELLBUTRIN XL) 300 MG 24 hr tablet TAKE 1 TABLET BY MOUTH EVERY DAY IN THE MORNING  . carbidopa-levodopa (SINEMET IR) 25-100 MG tablet Take 1 tablet by mouth 3 (three)  times daily.  . citalopram (CELEXA) 40 MG tablet Take 1 tablet (40  mg total) by mouth daily.  . Omega 3-6-9 Fatty Acids (OMEGA 3-6-9 PO) Take by mouth.  Marland Kitchen rOPINIRole (REQUIP) 2 MG tablet TAKE 2 TABLETS IN THE AM, 1 IN THE AFTERNOON AND 1 IN THE EVENING  . TURMERIC CURCUMIN PO Take by mouth.  . fesoterodine (TOVIAZ) 4 MG TB24 tablet Take 1 tablet (4 mg total) by mouth daily. (Patient not taking: Reported on 07/17/2017)   No facility-administered encounter medications on file as of 07/17/2017.     PAST MEDICAL HISTORY:   Past Medical History:  Diagnosis Date  .  OSA (obstructive sleep apnea) 01/17/2011   npsg 2012:  AHI 67/hr. Auto titration 2012:  Optimal pressure 12cm.   . APPENDECTOMY, HX OF 02/18/2008   Qualifier: Diagnosis of  By: [place], [place]    . Cardiac murmur    as a child  . Depression   . Headache(784.0)   . HEADACHES, HX OF 02/18/2008   Qualifier: Diagnosis of  By: Danny Lawless CMA, [place]    . Parkinson disease ([place]) 11/2014  . Streptococcal meningitis    as an infant    PAST SURGICAL HISTORY:   Past Surgical History:  Procedure Laterality Date  . APPENDECTOMY  1967  . NASAL SINUS SURGERY     x 4 as a child  . VASECTOMY      SOCIAL HISTORY:   Social History   Social History  . Marital status: Married    Spouse name: N/A  . Number of children: Y  . Years of education: N/A   Occupational History  . 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 Smoking status: Never Smoker  . Smokeless tobacco: Never Used  . Alcohol use No  . Drug use: No  . Sexual activity: Yes    Birth control/ protection: None   Other Topics Concern  . Not on file   Social History Narrative  . No narrative on file    FAMILY HISTORY:   Family Status  Relation Status  . Father Deceased       lung cancer, alzheimer's  . Mother Alive       HTN, A fib  . Brother Deceased       accident  . Sister Alive       healthy  . Son Alive       healthy  . Daughter Alive       healthy  . Daughter Alive       healthy  .  Mat Aunt Alive       Parkinson's Disease    ROS:  A complete 10 system review of systems was obtained and was unremarkable apart from what is mentioned above.  PHYSICAL EXAMINATION:    VITALS:   Vitals:   07/17/17 0820  BP: 124/72  Pulse: 76  SpO2: 96%  Weight: 300 lb (136.1 kg)  Height: '6\' 2"'$  (1.88 m)   Wt Readings from Last 3 Encounters:  07/17/17 300 lb (136.1 kg)  04/10/17 290 lb (131.5 kg)  03/20/17 290 lb (131.5 kg)     GEN:  The patient appears stated age and is in NAD. HEENT:  Normocephalic, atraumatic.  The mucous membranes are moist. The superficial temporal arteries are without ropiness or tenderness. CV:  RRR Lungs:  CTAB Neck/HEME:  There are no carotid bruits bilaterally. MS:  Has minor LE edema  Neurological examination:  Orientation: The patient is alert and oriented x3.  Cranial nerves: There is good facial symmetry. 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   The speech is fluent and clear.  He is hypophonic.  The patient is able to make the gutteral sounds without difficulty. Soft palate rises symmetrically and there is no tongue deviation. Hearing is intact to conversational tone. Sensation: Sensation is intact to light touch throughout Motor: Strength is 5/5 in the bilateral upper and lower extremities.   Shoulder shrug is equal and symmetric.  There is no pronator drift.  HAS NOT TAKEN ANY MEDICATION TODAY.  Movement examination: Tone: There is normal tone bilaterally Abnormal movements: There is no tremor today Coordination:  There is decremation with RAM's, seen with hand opening and closing on the left, finger taps on the bilaterally.  He is better with alternation of supination/pronation of the forearm.  Toe taps are slow on the left. Gait and Station: The patient has mild difficulty arising out of a deep-seated chair without the use of the hands.   The patient's stride length is decreased with decreased arm swing bilaterally but more so on the L.   He is slow with decreased arm swing bilateral.  Neg pull test.   Labs:    Chemistry      Component Value Date/Time   NA 139 09/13/2016 0905   K 4.3 09/13/2016 0905   CL 105 09/13/2016 0905   CO2 28 09/13/2016 0905   BUN 16 09/13/2016 0905   CREATININE 1.15 09/13/2016 0905      Component Value Date/Time   CALCIUM 9.2 09/13/2016 0905   ALKPHOS 58 09/13/2016 0905   AST 14 09/13/2016 0905   ALT 16 09/13/2016 0905   BILITOT 0.7 09/13/2016 0905     Lab Results  Component Value Date   HGBA1C 5.9 09/13/2016      ASSESSMENT/PLAN:  1.  Parkinsonism  - He and I talked about whether or not we should decrease requip since it can affect cognition and can affect swelling, although he swelling developed long after requip.  We will decrease requip from requip '4mg'$ /'2mg'$ /'2mg'$  to 2 mg tid.  Risks, benefits, side effects and alternative therapies were discussed.  The opportunity to ask questions was given and they were answered to the best of my ability.  The patient expressed understanding and willingness to follow the outlined treatment protocols.  -he is still in ACT classes with scholarship program and congratulated him on that.  He is starting RSB today and I am really proud of all of this today.  -met our new social worker today and talked about her services 2  Depression  -The patient reports that this has been "resolved."  He is not suicidal or homicidal.  He will remain on Celexa and Wellbutrin and remain under the care of psychiatry. 3.  Sialorrhea  -refuses botox but has asked me multiple times about what to do about this.  Will let me know if changes mind  -offered robinul but doesn't want to to try it. 4.  Dizziness and occasional Orthostatic hypotension  -drinking plenty of fluid and markedly improved with that  -has abdominal compression binder but isn't using. Told him to fill that RX.   His BP is really too high in the sitting position to start meds to raise the BP. 5.  Memory  loss  -He had repeat neuropsych testing on August 2 with a follow-up with Dr. Si Raider on May 12, 2016.  This was  largely consistent with his performance one year prior.  There is mild cognitive impairment consistent with Parkinson's disease and untreated obstructive sleep apnea syndrome.  There was evidence of major depressive disorder, and Dr. Si Raider felt that there was long-standing personality issues that influenced the patient's view of the world in a negative fashion.  There was not evidence of dementia at this point in time.  -if decreasing requip not helpful, will repeat neurocognitive testing again, although I am not convinced this is degenerating. 6.  REM behavior d/o  -just talking at night.  Doesn't want any medication.  Educated patient.  Will let me know if starts acting out the dreams/falling out of bed 7.  Urinary incontinence, frequency and nocturia  -following with [facility] and on toviaz.  He stopped it to see if it would help the swelling and bladder got worse and he has just restarted it.   8.  Follow up is anticipated in the next few months, sooner should new neurologic issues arise.  Much greater than 50% of this visit was spent in counseling and coordinating care.  Total face to face time:  30 min

## 2017-07-17 ENCOUNTER — Ambulatory Visit (INDEPENDENT_AMBULATORY_CARE_PROVIDER_SITE_OTHER): Payer: Federal, State, Local not specified - PPO | Admitting: Neurology

## 2017-07-17 ENCOUNTER — Encounter: Payer: Self-pay | Admitting: Neurology

## 2017-07-17 ENCOUNTER — Encounter: Payer: Self-pay | Admitting: Psychology

## 2017-07-17 VITALS — BP 124/72 | HR 76 | Ht 74.0 in | Wt 300.0 lb

## 2017-07-17 DIAGNOSIS — F331 Major depressive disorder, recurrent, moderate: Secondary | ICD-10-CM | POA: Diagnosis not present

## 2017-07-17 DIAGNOSIS — G20 Parkinson's disease: Secondary | ICD-10-CM | POA: Diagnosis not present

## 2017-07-17 NOTE — Progress Notes (Signed)
I met with Mr. [name] [name] and his wife, [name] [name], while they were in the clinic today. The purpose of the interaction was to introduce myself and learn a little more about him and his experiences. He reports a good support system with his wife. Maggie. They have three adult children: two daughters and a son. Their middle daughter lives in the home with them. He is currently on a wait list for a service dog.    We talked a little about his chronic depression and how he feels like it "went away" as soon as he was diagnosed with Parkinson's disease. We discussed how his depression may be better managed now that he has routine and how the exercise and the routine and socialization is probably helping the depression and the Parkinson's disease. He attends [employer] 2/week and [facility] 3/week. He identifies his schedule as" very busy" and reports that he has not been able to attend other groups like the [employer] [facility]'s group.   I gently brought up counseling as an option to help as he is learning to live well with Parkinson's. The routine and exercise  has been so helpful, it may be even more helpful to expand in some thing that gives him a sense of purpose and enjoyment. These things would just continue to help him in his journey of depression and Parkinson's. He verbalized a connection of purpose and not being able to work. We were able to discuss how purpose is not a job that it can be much different and always has the opportunity to be redefined as things change in life. It would be ok for this to be something separate than Parkinsons like a hobby or activity. He is interested in photography so we talked a little about the concept through that interest and passion.   I shared that I am available to discuss more if he and his wife are ever interested. Both the patient and his wife are aware of how to get in contact with me if needed.

## 2017-07-17 NOTE — Patient Instructions (Signed)
Decrease Requip to one tablet three times daily.

## 2017-08-01 ENCOUNTER — Other Ambulatory Visit: Payer: Self-pay | Admitting: Neurology

## 2017-08-16 ENCOUNTER — Ambulatory Visit: Payer: Federal, State, Local not specified - PPO | Admitting: Family Medicine

## 2017-08-16 ENCOUNTER — Encounter: Payer: Self-pay | Admitting: Family Medicine

## 2017-08-16 VITALS — BP 142/88 | HR 86 | Temp 97.7°F | Ht 74.0 in | Wt 306.0 lb

## 2017-08-16 DIAGNOSIS — H6121 Impacted cerumen, right ear: Secondary | ICD-10-CM | POA: Diagnosis not present

## 2017-08-16 MED ORDER — FLUTICASONE PROPIONATE 50 MCG/ACT NA SUSP: 2.0000 | Freq: Every day | NASAL | 0 refills | Status: DC

## 2017-08-16 NOTE — Patient Instructions (Signed)
Thank you for coming in,   Please try flonase and mineral oil.    Please feel free to call with any questions or concerns at any time, at [phone]. --Dr. Caffie Damme Tube Dysfunction The eustachian tube connects the middle ear to the back of the nose. It regulates air pressure in the middle ear by allowing air to move between the ear and nose. It also helps to drain fluid from the middle ear space. When the eustachian tube does not function properly, air pressure, fluid, or both can build up in the middle ear. Eustachian tube dysfunction can affect one or both ears. What are the causes? This condition happens when the eustachian tube becomes blocked or cannot open normally. This may result from:  Ear infections.  Colds and other upper respiratory infections.  Allergies.  Irritation, such as from cigarette smoke or acid from the stomach coming up into the esophagus (gastroesophageal reflux).  Sudden changes in air pressure, such as from descending in an airplane.  Abnormal growths in the nose or throat, such as nasal polyps, tumors, or enlarged tissue at the back of the throat (adenoids).  What increases the risk? This condition may be more likely to develop in people who smoke and people who are overweight. Eustachian tube dysfunction may also be more likely to develop in children, especially children who have:  Certain birth defects of the mouth, such as cleft palate.  Large tonsils and adenoids.  What are the signs or symptoms? Symptoms of this condition may include:  A feeling of fullness in the ear.  Ear pain.  Clicking or popping noises in the ear.  Ringing in the ear.  Hearing loss.  Loss of balance.  Symptoms may get worse when the air pressure around you changes, such as when you travel to an area of high elevation or fly on an airplane. How is this diagnosed? This condition may be diagnosed based on:  Your symptoms.  A physical exam of your  ear, nose, and throat.  Tests, such as those that measure: ? The movement of your eardrum (tympanogram). ? Your hearing (audiometry).  How is this treated? Treatment depends on the cause and severity of your condition. If your symptoms are mild, you may be able to relieve your symptoms by moving air into ("popping") your ears. If you have symptoms of fluid in your ears, treatment may include:  Decongestants.  Antihistamines.  Nasal sprays or ear drops that contain medicines that reduce swelling (steroids).  In some cases, you may need to have a procedure to drain the fluid in your eardrum (myringotomy). In this procedure, a small tube is placed in the eardrum to:  Drain the fluid.  Restore the air in the middle ear space.  Follow these instructions at home:  Take over-the-counter and prescription medicines only as told by your health care provider.  Use techniques to help pop your ears as recommended by your health care provider. These may include: ? Chewing gum. ? Yawning. ? Frequent, forceful swallowing. ? Closing your mouth, holding your nose closed, and gently blowing as if you are trying to blow air out of your nose.  Do not do any of the following until your health care provider approves: ? Travel to high altitudes. ? Fly in airplanes. ? Work in a Pension scheme manager or room. ? Scuba dive.  Keep your ears dry. Dry your ears completely after showering or bathing.  Do not smoke.  Keep all follow-up visits as  told by your health care provider. This is important. Contact a health care provider if:  Your symptoms do not go away after treatment.  Your symptoms come back after treatment.  You are unable to pop your ears.  You have: ? A fever. ? Pain in your ear. ? Pain in your head or neck. ? Fluid draining from your ear.  Your hearing suddenly changes.  You become very dizzy.  You lose your balance. This information is not intended to replace advice given to  you by your health care provider. Make sure you discuss any questions you have with your health care provider. Document Released: 10/16/2015 Document Revised: 02/25/2016 Document Reviewed: 10/08/2014 Elsevier Interactive Patient Education  [employer].

## 2017-08-16 NOTE — Progress Notes (Signed)
[name] [name] - 66 y.o. male MRN [id number]  Date of birth: 01-Aug-1951  SUBJECTIVE:  Including CC & ROS.  Chief Complaint  Patient presents with  . Right ear pain    present for two weeks.    [name] [name] is a 66 y.o. male that is having some right ear pain. The pain started about two weeks ago. No drainage. Throbbing in nature. Has not tried anything to help with the pain. No fever or chills. Has been worse today. No trauma to his ear.  Has a long history of sinusitis. Reports to have distant history of sinus surgery. No tinnitus.    Review of Systems  Constitutional: Negative for fever.  HENT: Positive for ear pain. Negative for ear discharge and hearing loss.     HISTORY: Past Medical, Surgical, Social, and Family History Reviewed & Updated per EMR.   Pertinent Historical Findings include:  Past Medical History:  Diagnosis Date  .  OSA (obstructive sleep apnea) 01/17/2011   npsg 2012:  AHI 67/hr. Auto titration 2012:  Optimal pressure 12cm.   . APPENDECTOMY, HX OF 02/18/2008   Qualifier: Diagnosis of  By: [place], [place]    . Cardiac murmur    as a child  . Depression   . Headache(784.0)   . HEADACHES, HX OF 02/18/2008   Qualifier: Diagnosis of  By: Danny Lawless CMA, [place]    . Parkinson disease ([place]) 11/2014  . Streptococcal meningitis    as an infant    Past Surgical History:  Procedure Laterality Date  . APPENDECTOMY  1967  . NASAL SINUS SURGERY     x 4 as a child  . VASECTOMY      No Known Allergies  Family History  Problem Relation Age of Onset  . Lung cancer Father   . Alcohol abuse Brother   . Drug abuse Brother   . Seizures Daughter      Social History   Socioeconomic History  . Marital status: Married    Spouse name: Not on file  . Number of children: Y  . Years of education: Not on file  . Highest education level: Not on file  Social Needs  . Financial resource strain: Not on file  . Food insecurity - worry: Not on file  . Food  insecurity - inability: Not on file  . Transportation needs - medical: Not on file  . Transportation needs - non-medical: Not on file  Occupational History  . Occupation: Armed forces operational officer: [employer]    Comment: Special educational needs teacher  Tobacco Use  . Smoking status: Never Smoker  . Smokeless tobacco: Never Used  Substance and Sexual Activity  . Alcohol use: No    Alcohol/week: 0.0 oz  . Drug use: No  . Sexual activity: Yes    Birth control/protection: None  Other Topics Concern  . Not on file  Social History Narrative  . Not on file     PHYSICAL EXAM:  VS: BP (!) 142/88 (BP Location: Left Arm, Patient Position: Sitting, Cuff Size: Normal)   Pulse 86   Temp 97.7 F (36.5 C) (Oral)   Ht 6\' 2"  (1.88 m)   Wt (!) 306 lb (138.8 kg)   SpO2 98%   BMI 39.29 kg/m  Physical Exam Gen: NAD, alert, cooperative with exam, well-appearing ENT: normal lips, normal nasal mucosa, cerumen impaction bilaterally.  Eye: normal EOM, normal conjunctiva and lids CV:  no edema, +2 pedal pulses   Resp: no  accessory muscle use, non-labored,  Skin: no rashes, no areas of induration  Neuro: normal tone, normal sensation to touch Psych:  normal insight, alert and oriented MSK: normal strength, wide based gait       ASSESSMENT & PLAN:   Cerumen impaction Bilateral impaction today. Right ear pain could have an association with Eustachian tube dysfunction.  - irrigation today. Not completely cleaned out but improved.  - counseled on OTC treatments.

## 2017-08-17 NOTE — Assessment & Plan Note (Signed)
Bilateral impaction today. Right ear pain could have an association with Eustachian tube dysfunction.  - irrigation today. Not completely cleaned out but improved.  - counseled on OTC treatments.

## 2017-08-19 ENCOUNTER — Other Ambulatory Visit (HOSPITAL_COMMUNITY): Payer: Self-pay | Admitting: Psychiatry

## 2017-08-19 DIAGNOSIS — F332 Major depressive disorder, recurrent severe without psychotic features: Secondary | ICD-10-CM

## 2017-09-14 ENCOUNTER — Other Ambulatory Visit: Payer: Self-pay

## 2017-09-14 MED ORDER — FLUTICASONE PROPIONATE 50 MCG/ACT NA SUSP: 2.0000 | Freq: Every day | NASAL | 11 refills | Status: DC

## 2017-09-18 ENCOUNTER — Encounter (HOSPITAL_COMMUNITY): Payer: Self-pay | Admitting: Psychiatry

## 2017-09-18 ENCOUNTER — Ambulatory Visit (INDEPENDENT_AMBULATORY_CARE_PROVIDER_SITE_OTHER): Payer: Federal, State, Local not specified - PPO | Admitting: Psychiatry

## 2017-09-18 DIAGNOSIS — Z79899 Other long term (current) drug therapy: Secondary | ICD-10-CM

## 2017-09-18 DIAGNOSIS — Z813 Family history of other psychoactive substance abuse and dependence: Secondary | ICD-10-CM

## 2017-09-18 DIAGNOSIS — G20 Parkinson's disease: Secondary | ICD-10-CM

## 2017-09-18 DIAGNOSIS — F419 Anxiety disorder, unspecified: Secondary | ICD-10-CM | POA: Diagnosis not present

## 2017-09-18 DIAGNOSIS — Z811 Family history of alcohol abuse and dependence: Secondary | ICD-10-CM | POA: Diagnosis not present

## 2017-09-18 DIAGNOSIS — F332 Major depressive disorder, recurrent severe without psychotic features: Secondary | ICD-10-CM

## 2017-09-18 MED ORDER — CITALOPRAM HYDROBROMIDE 40 MG PO TABS: 40.0000 mg | ORAL_TABLET | Freq: Every day | ORAL | 0 refills | Status: DC

## 2017-09-18 MED ORDER — BUPROPION HCL ER (XL) 300 MG PO TB24: ORAL_TABLET | ORAL | 0 refills | Status: DC

## 2017-09-18 NOTE — Progress Notes (Signed)
[facility] MD/PA/NP OP Progress Note  09/18/2017 9:12 AM [name] [name]  MRN:  [id number]  Chief Complaint: I am getting weak day by day.  I am nervous about my Parkinson.  HPI: [name] [name] came for his follow-up appointment with his wife.  He is compliant with Celexa and Wellbutrin and reported no side effects.  He sometimes feels nervous and anxious about his Parkinson.  Last week he fell but luckily no injuries.  He feels sometimes guilty that he is burden to his family but he does not feel that his depression is getting worse.  He does not drive and his wife takes him to the doctor's appointment.  He does not want to change his medication.  He is open to see a therapist.  Since taking his levodopa his tremors are better but he continues to have difficulty in his balance and walking.  He is sleeping good.  He denies any paranoia, hallucination, suicidal thoughts, crying spells or any feeling of hopelessness or worthlessness.  He lives with his wife who is very supportive and involved in his treatment plan.  He also compliant 3 times a week boxing club which he really enjoys.  Patient denies drinking alcohol or using any illegal substances.  His energy level is fair.  He wants to continue Wellbutrin and Celexa.  Visit Diagnosis:    ICD-10-CM   1. 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 Patient denies any history of suicidal attempt or any inpatient psychiatric treatment. He denies any history of mania, psychosis, paranoia or any hallucination. In the past he had tried Effexor, Abilify, Paxil, Cymbalta, Zoloft, amitriptyline, Pristiq, Britnellex, Celexa, Deplin, Vyvanse and Latuda. He is seen in this office since September 2009. He has seen multiple psychiatrists in the past.   Past Medical History:  Past Medical History:  Diagnosis Date  .   OSA (obstructive sleep apnea) 01/17/2011   npsg 2012:  AHI 67/hr. Auto titration 2012:  Optimal pressure 12cm.   . APPENDECTOMY, HX OF 02/18/2008   Qualifier: Diagnosis of  By: [place], [place]    . Cardiac murmur    as a child  . Depression   . Headache(784.0)   . HEADACHES, HX OF 02/18/2008   Qualifier: Diagnosis of  By: Danny Lawless CMA, [place]    . Parkinson disease ([place]) 11/2014  . Streptococcal meningitis    as an infant    Past Surgical History:  Procedure Laterality Date  . APPENDECTOMY  1967  . NASAL SINUS SURGERY     x 4 as a child  . VASECTOMY      Family Psychiatric History: Reviewed.  Family History:  Family History  Problem Relation Age of Onset  . Lung cancer Father   . Alcohol abuse Brother   . Drug abuse Brother   . Seizures Daughter     Social History:  Social History   Socioeconomic History  . Marital status: Married    Spouse name: None  . Number of children: Y  . Years of education: None  . Highest education level: None  Social Needs  . Financial resource strain: None  . Food insecurity - worry: None  . Food insecurity - inability: None  . Transportation needs - medical: None  . Transportation needs - non-medical: None  Occupational History  . 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 Smoking status: Never Smoker  . Smokeless tobacco: Never Used  Substance and Sexual Activity  . Alcohol use: No    Alcohol/week: 0.0 oz  . Drug use: No  . Sexual activity: Yes    Birth control/protection: None  Other Topics Concern  . None  Social History Narrative  . None    Allergies: No Known Allergies  Metabolic Disorder Labs: Lab Results  Component Value Date   HGBA1C 5.9 09/13/2016   No results found for: PROLACTIN Lab Results  Component Value Date   CHOL 206 (H) 03/14/2016   TRIG 121.0 03/14/2016   HDL 53.90 03/14/2016   CHOLHDL 4 03/14/2016   VLDL 24.2 03/14/2016   LDLCALC 127 (H)  03/14/2016   LDLCALC 105 (H) 03/16/2015   Lab Results  Component Value Date   TSH 1.16 03/14/2016   TSH 0.80 02/06/2014    Therapeutic Level Labs: No results found for: LITHIUM No results found for: VALPROATE No components found for:  CBMZ  Current Medications: Current Outpatient Medications  Medication Sig Dispense Refill  . B Complex-C (B-COMPLEX WITH VITAMIN C) tablet Take 1 tablet by mouth daily.    Marland Kitchen buPROPion (WELLBUTRIN XL) 300 MG 24 hr tablet TAKE 1 TABLET BY MOUTH EVERY DAY IN THE MORNING 90 tablet 0  . carbidopa-levodopa (SINEMET IR) 25-100 MG tablet Take 1 tablet by mouth 3 (three) times daily. 270 tablet 1  . citalopram (CELEXA) 40 MG tablet Take 1 tablet (40 mg total) by mouth daily. 90 tablet 0  . fesoterodine (TOVIAZ) 4 MG TB24 tablet Take 1 tablet (4 mg total) by mouth daily. 30 tablet 0  . fluticasone (FLONASE) 50 MCG/ACT nasal spray Place 2 sprays into both nostrils daily. 16 g 11  . Omega 3-6-9 Fatty Acids (OMEGA 3-6-9 PO) Take by mouth.    Marland Kitchen rOPINIRole (REQUIP) 2 MG tablet Take 1 tablet (2 mg total) by mouth 3 (three) times daily. Take 2 tablets in the AM, 1 in the afternoon and 1 in the evening 270 tablet 1  . TURMERIC CURCUMIN PO Take by mouth.     No current facility-administered medications for this visit.      Musculoskeletal: Strength & Muscle Tone: decreased Gait & Station: shuffle Patient leans: Front and Backward  Psychiatric Specialty Exam: ROS  Blood pressure 124/76, pulse 75, height 6\' 2"  (1.88 m), weight (!) 303 lb (137.4 kg), SpO2 95 %.Body mass index is 38.9 kg/m.  General Appearance: Casual  Eye Contact:  Fair  Speech:  Slow  Volume:  Decreased  Mood:  Euthymic  Affect:  Restricted  Thought Process:  Goal Directed  Orientation:  Full (Time, Place, and Person)  Thought Content: Rumination   Suicidal Thoughts:  No  Homicidal Thoughts:  No  Memory:  Immediate;   Fair Recent;   Fair Remote;   Fair  Judgement:  Good  Insight:  Good   Psychomotor Activity:  Decreased, Shuffling Gait and Tremor  Concentration:  Concentration: Fair and Attention Span: Fair  Recall:  [employer] of Knowledge: Good  Language: Good  Akathisia:  No  Handed:  Right  AIMS (if indicated): not done  Assets:  Communication Skills Desire for Improvement Housing Resilience Social Support  ADL's:  Intact  Cognition: Impaired,  Mild  Sleep:  Good   Screenings:   Assessment and Plan: Major depressive disorder, recurrent.  Anxiety disorder NOS.  Reassurance given.  Patient does  not want to change his medication.  However we will provide therapist name Felipa Emory and Oliver Pila for counseling.  He is aware that slowly and gradually his Parkinson may get worse.  Patient is scheduled to see neurologist in February.  I reviewed collateral information from other providers.  Encourage neurocognitive exercise and compliant with boxing club.  I will continue Wellbutrin XL 300 mg daily and Celexa 40 mg daily.  Recommended to call us back if is any question, concern if he feels worsening of the symptoms.  Follow-up in 3 months.   Kathlee Nations, MD 09/18/2017, 9:12 AM

## 2017-09-21 ENCOUNTER — Other Ambulatory Visit: Payer: Self-pay | Admitting: Neurology

## 2017-09-21 NOTE — Telephone Encounter (Signed)
Patient started Levodopa in July. It wasn't mentioned in office note from October. Please advise on refill.

## 2017-10-02 ENCOUNTER — Telehealth: Payer: Self-pay | Admitting: Internal Medicine

## 2017-10-02 DIAGNOSIS — H9209 Otalgia, unspecified ear: Secondary | ICD-10-CM

## 2017-10-02 NOTE — Telephone Encounter (Signed)
Copied from [place] [id number]. Topic: Referral - Request >> Oct 02, 2017  7:37 AM Synthia Innocent wrote: Reason for CRM: Request referral to ENT for ear pain, seen in Oct. Please advise Patient will not be home from 12-3

## 2017-10-04 NOTE — Telephone Encounter (Signed)
Additional CRM from 10/04/17: Patient's wife called very upset/irate over awaiting a referral for her husband...  Reason for CRM: Patient's wife [name] called very upset stating that she called on Monday, 10/02/2017 requesting a referral for her husband to see a ENT doctor. I advised Maggie that the request was sent to Dr. Sharlet Salina. [name] became very irate and  stated that she better hear back from the office today concerning her husband's referral. I advised [name] that it takes time for the referrals to be reviewed. Maggie stated that this is unacceptable, and that they have been with [facility] for many years.  I advised [name] that I would send the referral request again, but to give the medical staff time to review it. [name] stated that she needs a call back today by 1:30pm (Wednesday 10/04/2017).    Thank You!!!   I called the patients wife and explained to her that Dr Sharlet Salina has been out of the office but will be back tomorrow. She told me that the patient was seen by Dr Raeford Razor for ear pain but she was not impressed by his care. She has been doing everything he recommended but the patient is still in a lot of pain and she feels seeing an ENT would be the best thing for him.

## 2017-10-05 NOTE — Telephone Encounter (Signed)
LVM for wife to call back in regards to referral

## 2017-10-05 NOTE — Telephone Encounter (Signed)
Advise patient that referral placed but these typically take 1-2 weeks to get scheduled. They can come back to the office to be seen in the meantime if they want. Also we do not tolerate rudeness to our staff and if this behavior continues we may be unable to continue to see them. We do understand that they are frustrated but rudeness is not accepted.

## 2017-10-09 ENCOUNTER — Ambulatory Visit: Payer: Federal, State, Local not specified - PPO | Admitting: Internal Medicine

## 2017-10-09 ENCOUNTER — Other Ambulatory Visit (INDEPENDENT_AMBULATORY_CARE_PROVIDER_SITE_OTHER): Payer: Federal, State, Local not specified - PPO

## 2017-10-09 ENCOUNTER — Encounter: Payer: Self-pay | Admitting: Neurology

## 2017-10-09 ENCOUNTER — Encounter: Payer: Self-pay | Admitting: Internal Medicine

## 2017-10-09 VITALS — BP 122/74 | HR 71 | Temp 97.8°F | Ht 74.0 in | Wt 304.0 lb

## 2017-10-09 DIAGNOSIS — M26609 Unspecified temporomandibular joint disorder, unspecified side: Secondary | ICD-10-CM

## 2017-10-09 DIAGNOSIS — E785 Hyperlipidemia, unspecified: Secondary | ICD-10-CM

## 2017-10-09 DIAGNOSIS — N529 Male erectile dysfunction, unspecified: Secondary | ICD-10-CM

## 2017-10-09 DIAGNOSIS — R600 Localized edema: Secondary | ICD-10-CM | POA: Diagnosis not present

## 2017-10-09 LAB — CBC
HCT: 40.5 % (ref 39.0–52.0)
Hemoglobin: 13.4 g/dL (ref 13.0–17.0)
MCHC: 33.1 g/dL (ref 30.0–36.0)
MCV: 92.6 fl (ref 78.0–100.0)
Platelets: 276 10*3/uL (ref 150.0–400.0)
RBC: 4.38 Mil/uL (ref 4.22–5.81)
RDW: 13.8 % (ref 11.5–15.5)
WBC: 5.7 10*3/uL (ref 4.0–10.5)

## 2017-10-09 LAB — COMPREHENSIVE METABOLIC PANEL
ALBUMIN: 4.3 g/dL (ref 3.5–5.2)
ALK PHOS: 66 U/L (ref 39–117)
ALT: 21 U/L (ref 0–53)
AST: 13 U/L (ref 0–37)
BILIRUBIN TOTAL: 0.6 mg/dL (ref 0.2–1.2)
BUN: 16 mg/dL (ref 6–23)
CO2: 28 mEq/L (ref 19–32)
CREATININE: 1.14 mg/dL (ref 0.40–1.50)
Calcium: 9.3 mg/dL (ref 8.4–10.5)
Chloride: 104 mEq/L (ref 96–112)
GFR: 68.69 mL/min (ref >60.00)
Glucose, Bld: 113 mg/dL — ABNORMAL HIGH (ref 70–99)
Potassium: 4.3 mEq/L (ref 3.5–5.1)
SODIUM: 138 meq/L (ref 135–145)
TOTAL PROTEIN: 7.2 g/dL (ref 6.0–8.3)

## 2017-10-09 LAB — LIPID PANEL
CHOLESTEROL: 172 mg/dL (ref 0–200)
HDL: 52.3 mg/dL (ref >39.00)
LDL Cholesterol: 96 mg/dL (ref 0–99)
NonHDL: 120
Total CHOL/HDL Ratio: 3
Triglycerides: 120 mg/dL (ref 0.0–149.0)
VLDL: 24 mg/dL (ref 0.0–40.0)

## 2017-10-09 LAB — HEMOGLOBIN A1C: HEMOGLOBIN A1C: 6.1 % (ref 4.6–6.5)

## 2017-10-09 MED ORDER — SILDENAFIL CITRATE 100 MG PO TABS: 50.0000 mg | ORAL_TABLET | Freq: Every day | ORAL | 2 refills | Status: DC | PRN

## 2017-10-09 NOTE — Progress Notes (Signed)
   Subjective:    Patient ID: [name] [name], male    DOB: [birth year], 67 y.o.   MRN: [id number]  HPI The patient is a 67 YO man coming in for several concerns including right ear pain. Going on for about 3 months off and on. Comes at certain times and sometimes in the night. Night time is worse as well as with eating and sometimes talking. He has been using mineral oil in the ear without relief. Got his ears cleaned when he was here last time which did not help symptoms. Denies fevers or chills. No change in the sinus symptoms from usual. No SOB or cough.   Next concern is leg swelling. This has been going on for about 1-2 years. This is stable recently. They would like to get some compression stockings but are not sure if that would help. Now have a lift chair for his parkinson's but his legs feel like blocks and they are not sure if this is from swelling or from parkinson's. Activity level is not high overall. Denies excessive intake of salty foods but his wife thinks he does not drink enough water.   Next concern is his ED. This is concerning to him and he would like to try viagra for this. Is not able to maintain erections for intercourse. Denies pain or burning. No sores or lesions. No drainage. No testicular swelling. Denies new partners.   Review of Systems  Constitutional: Negative.   HENT: Positive for ear pain. Negative for congestion, dental problem, ear discharge, nosebleeds, postnasal drip, rhinorrhea, sore throat, tinnitus and trouble swallowing.   Eyes: Negative.   Respiratory: Negative for cough, chest tightness and shortness of breath.   Cardiovascular: Positive for leg swelling. Negative for chest pain and palpitations.  Gastrointestinal: Negative for abdominal distention, abdominal pain, constipation, diarrhea, nausea and vomiting.  Genitourinary:       ED  Musculoskeletal: Negative.   Skin: Negative.   Neurological: Negative.   Psychiatric/Behavioral: Negative.         Objective:   Physical Exam  Constitutional: He is oriented to person, place, and time. He appears well-developed and well-nourished.  HENT:  Head: Normocephalic and atraumatic.  Right Ear: External ear normal.  Left Ear: External ear normal.  Eyes: EOM are normal.  Neck: Normal range of motion.  Cardiovascular: Normal rate and regular rhythm.  Pulmonary/Chest: Effort normal and breath sounds normal. No respiratory distress. He has no wheezes. He has no rales.  Abdominal: Soft. Bowel sounds are normal. He exhibits no distension. There is no tenderness. There is no rebound.  Musculoskeletal: He exhibits no edema.  Neurological: He is alert and oriented to person, place, and time. Coordination abnormal.  Skin: Skin is warm and dry.  Psychiatric:  Flat affect   Vitals:   10/09/17 1043  BP: 122/74  Pulse: 71  Temp: 97.8 F (36.6 C)  TempSrc: Oral  SpO2: 98%  Weight: (!) 304 lb (137.9 kg)  Height: 6\' 2"  (1.88 m)      Assessment & Plan:

## 2017-10-09 NOTE — Patient Instructions (Signed)
We think that the ear pain is caused by TMJ not your ear. Schedule a visit with your dentist to get a mouthguard made to help with this. It is okay to use ibuprofen for the pain up to 3 times per day.   We are checking the labs today.  We have given you the prescription for the compression stockings today. You can use [facility] or [facility] to do this for you.  Temporomandibular Joint Syndrome Temporomandibular joint (TMJ) syndrome is a condition that affects the joints between your jaw and your skull. The TMJs are located near your ears and allow your jaw to open and close. These joints and the nearby muscles are involved in all movements of the jaw. People with TMJ syndrome have pain in the area of these joints and muscles. Chewing, biting, or other movements of the jaw can be difficult or painful. TMJ syndrome can be caused by various things. In many cases, the condition is mild and goes away within a few weeks. For some people, the condition can become a long-term problem. What are the causes? Possible causes of TMJ syndrome include:  Grinding your teeth or clenching your jaw. Some people do this when they are under stress.  Arthritis.  Injury to the jaw.  Head or neck injury.  Teeth or dentures that are not aligned well.  In some cases, the cause of TMJ syndrome may not be known. What are the signs or symptoms? The most common symptom is an aching pain on the side of the head in the area of the TMJ. Other symptoms may include:  Pain when moving your jaw, such as when chewing or biting.  Being unable to open your jaw all the way.  Making a clicking sound when you open your mouth.  Headache.  Earache.  Neck or shoulder pain.  How is this diagnosed? Diagnosis can usually be made based on your symptoms, your medical history, and a physical exam. Your health care provider may check the range of motion of your jaw. Imaging tests, such as X-rays or an MRI, are sometimes  done. You may need to see your dentist to determine if your teeth and jaw are lined up correctly. How is this treated? TMJ syndrome often goes away on its own. If treatment is needed, the options may include:  Eating soft foods and applying ice or heat.  Medicines to relieve pain or inflammation.  Medicines to relax the muscles.  A splint, bite plate, or mouthpiece to prevent teeth grinding or jaw clenching.  Relaxation techniques or counseling to help reduce stress.  Transcutaneous electrical nerve stimulation (TENS). This helps to relieve pain by applying an electrical current through the skin.  Acupuncture. This is sometimes helpful to relieve pain.  Jaw surgery. This is rarely needed.  Follow these instructions at home:  Take medicines only as directed by your health care provider.  Eat a soft diet if you are having trouble chewing.  Apply ice to the painful area. ? Put ice in a plastic bag. ? Place a towel between your skin and the bag. ? Leave the ice on for 20 minutes, 2-3 times a day.  Apply a warm compress to the painful area as directed.  Massage your jaw area and perform any jaw stretching exercises as recommended by your health care provider.  If you were given a mouthpiece or bite plate, wear it as directed.  Avoid foods that require a lot of chewing. Do not chew  gum.  Keep all follow-up visits as directed by your health care provider. This is important. Contact a health care provider if:  You are having trouble eating.  You have new or worsening symptoms. Get help right away if:  Your jaw locks open or closed. This information is not intended to replace advice given to you by your health care provider. Make sure you discuss any questions you have with your health care provider. Document Released: 06/14/2001 Document Revised: 05/19/2016 Document Reviewed: 04/24/2014 Elsevier Interactive Patient Education  [employer].

## 2017-10-10 ENCOUNTER — Telehealth: Payer: Self-pay | Admitting: Internal Medicine

## 2017-10-10 ENCOUNTER — Telehealth: Payer: Self-pay | Admitting: Psychology

## 2017-10-10 DIAGNOSIS — M26609 Unspecified temporomandibular joint disorder, unspecified side: Secondary | ICD-10-CM | POA: Insufficient documentation

## 2017-10-10 DIAGNOSIS — R600 Localized edema: Secondary | ICD-10-CM

## 2017-10-10 NOTE — Assessment & Plan Note (Signed)
Rx for compression stockings. Suspect lack of activity and diet are contributing. Previous workup without indication of heart, liver or kidney contributing to swelling and more likely to be venous insufficiency.

## 2017-10-10 NOTE — Assessment & Plan Note (Addendum)
Can continue using naproxen at home for the pain. Advised to see his dentist for possible insert or alignment problems to help solve and given information about TMJ and ways to reduce his pain. Likely does not need ENT referral.

## 2017-10-10 NOTE — Telephone Encounter (Signed)
Copied from [place] [id number]. Topic: Quick Communication - See Telephone Encounter >> Oct 10, 2017 11:20 AM Oneta Rack wrote: CRM for notification. See Telephone encounter for:   10/10/17.   Relation to pt: self  Call back number: Pharmacy:  Reason for call:  Patient checking on the status of compression socks rx, please advise

## 2017-10-10 NOTE — Telephone Encounter (Signed)
Fine to print another for them to pickup.

## 2017-10-10 NOTE — Telephone Encounter (Signed)
Patient states that they did not get the Rx for the compressions stocking, wanting to know if thye can get another one and I can mail it to them

## 2017-10-10 NOTE — Telephone Encounter (Signed)
I contact me requesting that she and her husband would be able to meet with me next week.  I have availability on Monday and schedule an appointment with them at 10:00.  The wife reported that her husband is feeling very down and they needed some help with connecting with resources/counseling.

## 2017-10-10 NOTE — Assessment & Plan Note (Signed)
Rx for viagra sent in. Does not wish to see urology at this time due to burden of other doctor visits.

## 2017-10-12 ENCOUNTER — Telehealth: Payer: Self-pay | Admitting: Neurology

## 2017-10-12 DIAGNOSIS — G20 Parkinson's disease: Secondary | ICD-10-CM

## 2017-10-12 NOTE — Telephone Encounter (Signed)
Referral entered  

## 2017-10-12 NOTE — Telephone Encounter (Signed)
-----   Message from Frazier Butt, PT sent at 10/12/2017 11:36 AM EST ----- Good morning, [name] [name] is scheduled for return 6 month eval in early February, for PT, OT, speech therapies, which was agreed upon by patient at his discharge.    Could you please send PT, OT, speech therapy orders via Epic?  Thank you.  Mady Haagensen, PT

## 2017-10-12 NOTE — Telephone Encounter (Signed)
Mailed

## 2017-10-13 ENCOUNTER — Encounter: Payer: Self-pay | Admitting: Internal Medicine

## 2017-10-16 ENCOUNTER — Ambulatory Visit (INDEPENDENT_AMBULATORY_CARE_PROVIDER_SITE_OTHER): Payer: Federal, State, Local not specified - PPO | Admitting: Psychology

## 2017-10-16 DIAGNOSIS — F331 Major depressive disorder, recurrent, moderate: Secondary | ICD-10-CM

## 2017-10-16 NOTE — Progress Notes (Signed)
I met with the patient and his wife today to address some concerns about depression that the patient is experiencing.  The patient has a history of depression that he reports subsided after his diagnosis of Parkinson's disease.  He reports the feelings of depression are returning.  He has a history of seeing a counselor but cannot see the counselor he would like due to Medicare being his primary insurance coverage after March.  They are seeking a referral for a therapist.  At this current time the patient is still exercising 5 times a week with 2 of his classes being at rock steady boxing.  We talked about how important it is to maintain that exercise as it can benefit both depression and Parkinson's.  After talking with them I recommended Pervis Hocking of [facility] and will send over a referral today.

## 2017-10-27 ENCOUNTER — Ambulatory Visit (INDEPENDENT_AMBULATORY_CARE_PROVIDER_SITE_OTHER): Payer: Federal, State, Local not specified - PPO | Admitting: Psychology

## 2017-10-27 DIAGNOSIS — F4323 Adjustment disorder with mixed anxiety and depressed mood: Secondary | ICD-10-CM

## 2017-10-30 ENCOUNTER — Ambulatory Visit (INDEPENDENT_AMBULATORY_CARE_PROVIDER_SITE_OTHER): Payer: Federal, State, Local not specified - PPO | Admitting: Psychology

## 2017-10-30 DIAGNOSIS — F4323 Adjustment disorder with mixed anxiety and depressed mood: Secondary | ICD-10-CM

## 2017-11-08 ENCOUNTER — Encounter: Payer: Self-pay | Admitting: Physical Therapy

## 2017-11-08 ENCOUNTER — Encounter: Payer: Self-pay | Admitting: Occupational Therapy

## 2017-11-08 ENCOUNTER — Ambulatory Visit: Payer: Federal, State, Local not specified - PPO | Attending: Neurology | Admitting: Physical Therapy

## 2017-11-08 ENCOUNTER — Ambulatory Visit: Payer: Federal, State, Local not specified - PPO

## 2017-11-08 ENCOUNTER — Ambulatory Visit: Payer: Federal, State, Local not specified - PPO | Admitting: Occupational Therapy

## 2017-11-08 DIAGNOSIS — R471 Dysarthria and anarthria: Secondary | ICD-10-CM | POA: Insufficient documentation

## 2017-11-08 DIAGNOSIS — R29898 Other symptoms and signs involving the musculoskeletal system: Secondary | ICD-10-CM | POA: Diagnosis present

## 2017-11-08 DIAGNOSIS — R4701 Aphasia: Secondary | ICD-10-CM

## 2017-11-08 DIAGNOSIS — R2689 Other abnormalities of gait and mobility: Secondary | ICD-10-CM | POA: Insufficient documentation

## 2017-11-08 DIAGNOSIS — R278 Other lack of coordination: Secondary | ICD-10-CM | POA: Insufficient documentation

## 2017-11-08 DIAGNOSIS — R2681 Unsteadiness on feet: Secondary | ICD-10-CM | POA: Diagnosis present

## 2017-11-08 DIAGNOSIS — R293 Abnormal posture: Secondary | ICD-10-CM | POA: Insufficient documentation

## 2017-11-08 DIAGNOSIS — R41841 Cognitive communication deficit: Secondary | ICD-10-CM

## 2017-11-08 DIAGNOSIS — R41840 Attention and concentration deficit: Secondary | ICD-10-CM | POA: Insufficient documentation

## 2017-11-08 DIAGNOSIS — R29818 Other symptoms and signs involving the nervous system: Secondary | ICD-10-CM

## 2017-11-08 NOTE — Therapy (Signed)
[facility] [address] [place], [place], [address] Phone: [phone]   Fax:  [phone]  Speech Language Pathology Evaluation  Patient Details  Name: [name] [name] MRN: [id number] Date of Birth: [birth year] Referring Provider: Alonza Bogus, DO   Encounter Date: 11/08/2017  End of Session - 11/08/17 1417    Visit Number  1    Number of Visits  17    Date for SLP Re-Evaluation  01/19/18    SLP Start Time  0933    SLP Stop Time   1017    SLP Time Calculation (min)  44 min    Activity Tolerance  Patient tolerated treatment well       Past Medical History:  Diagnosis Date  .  OSA (obstructive sleep apnea) 01/17/2011   npsg 2012:  AHI 67/hr. Auto titration 2012:  Optimal pressure 12cm.   . APPENDECTOMY, HX OF 02/18/2008   Qualifier: Diagnosis of  By: [place], [place]    . Cardiac murmur    as a child  . Depression   . Headache(784.0)   . HEADACHES, HX OF 02/18/2008   Qualifier: Diagnosis of  By: Danny Lawless CMA, [place]    . Parkinson disease ([place]) 11/2014  . Streptococcal meningitis    as an infant    Past Surgical History:  Procedure Laterality Date  . APPENDECTOMY  1967  . NASAL SINUS SURGERY     x 4 as a child  . VASECTOMY      There were no vitals filed for this visit.  Subjective Assessment - 11/08/17 0947    Subjective  "I think I'm sliding downhill." (with speech and langauge, memory) "I need to work on [employer]."         SLP Evaluation [facility] - 11/08/17 0949      SLP Visit Information   SLP Received On  11/08/17    Referring Provider  Tat, [name], DO    Onset Date  Diagnosed with PD since 2015    Medical Diagnosis  Parkinson's Disease      Subjective   Subjective  Pt reprots more concern over losing train of thought rather than speech loudness because he can keep his train of thought if asked to repeat.     Patient/Family Stated Goal  "Improve my memory, and my memory to talk  (losing train of thought)"      Pain Assessment   Currently in Pain?  No/denies      General Information   HPI  Pt well known to this SLP from previous courses of therapy. Reports today deficits in attention ("I go to do three things in a room, and I will only return doing two of them."), as well as reduced loudness ("My family still tells me I'm mumbling.")      Balance Screen   Has the patient fallen in the past 6 months  Yes    How many times?  4    Has the patient had a decrease in activity level because of a fear of falling?   No    Is the patient reluctant to leave their home because of a fear of falling?   No      Prior Functional Status   Cognitive/Linguistic Baseline  Baseline deficits    Baseline deficit details  Anomia, attention, memory     Lives With  Spouse;Family    Vocation  Retired      Associate Professor   Overall Cognitive Status  History  of cognitive impairments - at baseline    Area of Impairment  Attention;Memory    Current Attention Level  Sustained    Attention Comments  -- reports difficulty with focus to do three items    Memory  --    Memory Comments  Hopkins Verbal Learning Test administered today with WNL results. SLP believes pt's memory problems are attention-based.       Verbal Expression   Overall Verbal Expression  Impaired    Naming  Impairment    Other Verbal Expression Comments  Pt does not note anomia as a major concern.       Oral Motor/Sensory Function   Overall Oral Motor/Sensory Function  Impaired    Labial ROM  Within Functional Limits    Labial Strength  Reduced Right    Labial Coordination  Reduced    Lingual ROM  Reduced right;Reduced left    Lingual Symmetry  Abnormal symmetry right    Lingual Strength  Reduced Left    Lingual Coordination  Reduced      Motor Speech   Overall Motor Speech  Impaired    Respiration  Impaired    Level of Impairment  Sentence    Phonation  Low vocal intensity;Hoarse mid-upper 60s dB in 7 minutes simple  conversation    Volume  Soft Pt does not report as major complaint      Standardized Assessments   Standardized Assessments   Other Assessment Hopkins Verbal Learning Test    Other Assessment  Recall: 6 (WNL), 9 (WNL), 10 (WNL); Recognition: 12/12 (WNL)      Pt was asked to alternate recitation of months in backwards order and did so with minor extra time 100% success. He was then asked to recite numbers and letters of alphabet in alternating and ascending order. SLP ensured pt understood directions prior to beginning. He completed the first 7 combinations correctly and then cont'd with correct number recitation but after approx 20 seconds changed to incorrectly *providing the first letter of a month*, and continuing with first letters of months, in a disjointed order.                SLP Education - 11/08/17 1415    Education provided  Yes    Education Details  ST eval results, likely difficulty is more with concentration/attention than with memory per se, will not focus on loudness per pt request    Person(s) Educated  Patient    Methods  Explanation;Verbal cues    Comprehension  Verbalized understanding;Verbal cues required;Need further instruction       SLP Short Term Goals - 11/08/17 1430      SLP SHORT TERM GOAL #1   Title  pt will demo working Buyer, retail appropriate for recall of 60% of details    Time  4    Period  Weeks    Status  New      SLP SHORT TERM GOAL #2   Title  pt will demo attention appropriate for 5 minutes simple conversation with modified independence (compensations)    Baseline  4    Period  Weeks    Status  New      SLP SHORT TERM GOAL #3   Time  1    Period  Weeks    Status  Deferred work on Animal nutritionist      SLP SHORT TERM GOAL #4   Title  pt will tell SLP three ways to compensate for verbal expression errors/events  over two sessions    Status  Deferred       SLP Long Term Goals - 2017-11-29 1432      SLP LONG TERM GOAL #1    Title  pt will demo attention skills adequate for functional verbal expression in 10 minutes simple-mod complex conversation over two sessions    Time  8    Period  Weeks    Status  New      SLP LONG TERM GOAL #2   Title  pt will complete functional working memory tasks with rare min A and compensatory measures    Time  8    Period  Weeks    Status  New      SLP LONG TERM GOAL #3   Time  4    Period  Weeks    Status  On-going       Plan - 2017/11/29 1419    Clinical Impression Statement  Pt presents today with what appears to be deficits in attention hindering his abilities in converastion as pt reports (and he demonstrated today) that he loses train of thought mid-sentence. SLP asked pt if he thought loudness was a concern and pt said it was not, adn reiterated pt lack of communication from losing his train of thought was. Therefore, SLP will focus on this. Pt would benefit from skilled ST addressing attention and attention compensations.     Speech Therapy Frequency  2x / week    Duration  -- 8 weeks (possible 4-6 weeks depending on progress) 0r 16 sessions    Treatment/Interventions  Language facilitation;Internal/external aids;Compensatory techniques;SLP instruction and feedback;Multimodal communcation approach;Cognitive reorganization;Functional tasks;Cueing hierarchy;Patient/family education    Potential to Achieve Goals  Good    Potential Considerations  Severity of impairments       Patient will benefit from skilled therapeutic intervention in order to improve the following deficits and impairments:   Cognitive communication deficit  Dysarthria and anarthria  Aphasia  G-Codes - 11-29-17 1434    Functional Assessment Tool Used  clinical judgment, NOMS    Functional Limitations  Attention    Attention Current Status (I4580)  At least 40 percent but less than 60 percent impaired, limited or restricted    Attention Goal Status (D9833)  At least 40 percent but less than 60  percent impaired, limited or restricted       Problem List Patient Active Problem List   Diagnosis Date Noted  . TMJ (temporomandibular joint syndrome) 10/10/2017  . Parkinson's disease ([place]) 03/20/2017  . Bilateral lower extremity edema 03/20/2017  . Chronic venous insufficiency 09/13/2016  . Fatigue 03/15/2016  . Parkinsonian features 09/15/2015  . ED (erectile dysfunction) 09/15/2015  . MDD (major depressive disorder), recurrent episode, severe ([place]) 02/10/2014  . Nonspecific abnormal electrocardiogram (ECG) (EKG) 02/07/2014  . Non-compliant behavior 02/03/2014  . Morton's neuroma of left foot 08/07/2013  . 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 Hyperglycemia 01/26/2012  . Hypogonadism male 01/24/2012  . Syncope and collapse 01/20/2012  .  OSA (obstructive sleep apnea) 01/17/2011  . CHEST TIGHTNESS 02/18/2008    Shannin Naab ,MS, CCC-SLP  Nov 29, 2017, 2:35 PM  [facility] [address] [place], [place], [address] Phone: [phone]   Fax:  [phone]  Name: [name] [name] MRN: [id number] Date of Birth: 01-10-1951

## 2017-11-08 NOTE — Therapy (Signed)
[facility] [address] [place], [place], [address] Phone: [phone]   Fax:  [phone]  Patient Details  Name: [name] [name] MRN: [id number] Date of Birth: [birth year] Referring Provider:  No ref. provider found  Encounter Date: 11/08/2017   SPEECH THERAPY DISCHARGE SUMMARY  Visits from Start of Care: 11  Current functional level related to goals / functional outcomes:  Pt's goals at last session were as follows:  SLP Short Term Goals - 04/28/17 1035              SLP SHORT TERM GOAL #1    Title (P)  Pt will average loud /a/ of 88dB over 3 sessions    Status (P)  Achieved         SLP SHORT TERM GOAL #2    Title (P)  Pt will average low-70s dB  in 10 minutes simple conversation with rare min A over 3 sessions    Status (P)  Achieved         SLP SHORT TERM GOAL #3    Title (P)  pt will demo working memory skills appropriate for recall of 80% of details    Time (P)  1    Period (P)  Weeks    Status (P)  Deferred  work on Animal nutritionist         SLP [employer] #4    Title (P)  pt will tell SLP three ways to compensate for verbal expression errors/events over two sessions    Status (P)  Deferred                       SLP Long Term Goals - 05/05/17 0949              SLP LONG TERM GOAL #1    Title pt to maintain average 70dB in 15 minutes mod complex conversation outside [place] room over three sessions    Time 4    Period Weeks    Status On-going         SLP LONG TERM GOAL #2    Title pt will demo aphasia compenastions functionally in 8 minutes mod complex conversation over two sessions    Time 4    Period Weeks    Status On-going         SLP LONG TERM GOAL #3    Title pt will demo working memory skills adequate for functional verbal expression in 15 minutes mod complex conversation over two sessions    Time 4    Period Weeks    Status On-going      Remaining deficits: Assumed all  deficits remain.    Education / Equipment: Compesnations for aphasia, ways to incr loudness.  Plan: Patient agrees to discharge.  Patient goals were not met. Patient is being discharged due to not returning since the last visit.  ?????       [facility] ,MS, CCC-SLP  11/08/2017, 9:39 AM  [facility] [address] [place], [place], [address] Phone: [phone]   Fax:  [phone]

## 2017-11-08 NOTE — Patient Instructions (Addendum)
You might want to keep a pad and pen on you at all times to jot notes for what you want to say/share if you are forgetting this. I think this has more to do with concentration than memory, as your memory for words (recall, and recognition) was normal when you took my test today. It would be excellent to have your wife and/or daughter attend therapy with you, once a week.

## 2017-11-08 NOTE — Progress Notes (Signed)
[name] [name] was seen today in the movement disorders clinic for neurologic consultation at the request of [name], MD.  The consultation is for the evaluation of slowness, flat affect and to r/o a neurologic disorder.  This patient is accompanied in the office by his spouse who supplements the history.  The first symptom(s) the patient noticed was a feeling of weakness that has been going on for about a year.  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  For about a year and a half he has noted a "body" tremor and his wife has noted a hand tremor in both hands (at rest).  He is stiff like he is "a 67 year old man."  Once he is in bed he finds it is hard to move.  The patient has a hx of significant depression and is on multiple medications related to this.  He was started on cogentin just yesterday, but is also on celexa, wellbutrin and latuda.  He has been on [place] for 1.5 months.  Prior to that he was on abilify (tried on 2 separate times but most recently was only on it for a day) and geodon(doesn't remember this but looks like it may have been given in the past).  He has been on risperdal but cannot remember how long ago.  ECT was offered but his wife does not want him to proceed with that  11/20/14 update:  The patient returns today for follow-up.  He is accompanied by his wife who supplements the history.  He has been off of [place] for about 2 weeks.  Unfortunately, he was unable to afford the dat scan.  He is on just Wellbutrin and Celexa for depression and his Cogentin and has been discontinued.  He has noted a decreased appetite and dry mouth and having trouble sleeping because of stiffness.  Pt states that he is almost afraid to go to sleep.  Missing 2 days of work per week (works nights) but a lot of that because of anxiety.  His wife notices increased tremor.  He has slow at work.  Admits to some  lightheadedness but no syncope.  Note diplopia.  No hallucinations.  His wife is doing most of the driving because he is having difficulty staying in his own lane.  02/19/15 update:  The patient is following up today regarding his parkinsonism.  I reviewed records from his psychiatrist since last visit.  His psychiatry note from 12/04/14 indicates that the patient stated that tremor was better.  However, I received a call 4 days later stating that tremor was worse and he thought it was from the Mirapex.  He wanted to change the Mirapex to something else.  He was on Mirapex 0.5 mg 3 times per day at the time.  We ended up switching it to Requip 1 mg 3 times a day.  Fortunately he is doing better.  He denies any side effects with the Requip.  He currently takes it at 8 AM/2 PM/8 PM.  He does state that he notices that he "shuffles with the left leg."  He has not fallen but sometimes feels off balance.  He states that he finished his physical/occupational/speech therapy and feels that it went well.  He is not exercising on his own.  He initially states that he does not feel that he has time, but also states that he does not  go into work until 2 PM and works until 10:30 PM.  This is a change for him.  He was able to change his job at [employer] and seems like that is doing better.  His wife mentions that he continues to have memory loss that she thinks has increased, but she also states that she has noticed memory loss ever since starting on antidepressants.  He had one fall since last visit.  This was when he was taking the trash to the street.  He had no fractures with this, just a superficial scrape.    05/25/15 update:  The patient presents today, accompanied by his wife who supplements the history.  His Requip was increased last visit so that was taking 2 tablets in the morning, one in afternoon and 2 in the evening and 1 day after I started this, I received a call from his wife that the patient did not  tolerate it and it made symptoms worse (which is what they reported with Mirapex).  While that was very odd to me, I told them to go back down to 1 mg 3 times per day.  He actually comes back on 1 mg, 1 in the AM, 2 in the afternoon and 1 in the evening.  He reports that he is doing worse.  He has more tremor and he feels tired in the evening.  Pts wife asks about trying to go back up on the requip slowly.   He had neuropsych testing in June at [facility].  I reviewed that.  There was no evidence of dementia.  There was evidence of mild cognitive impairment from Parkinson's disease as well as from chronic major depressive disorder.  It is recommended that the patient consider transcranial magnetic stimulation for depression, and he told the neuropsychologist that he considered in the past and opted against it.  She encouraged him to attend a Parkinson's support group and he told her that he tried that once and it was not helpful.  He still c/o "my memory has been terrible and especially over the last few weeks."  He feels that he is not depressed - "that is well controlled."  He is not doing CV exercise.  He is still in his new job but it seems more physical than his old job and he thinks he is not as fast in getting tasks done.  He asks about DBS.  08/25/15 update:  The patient is following up today, accompanied by his wife who supplements the history.  I have reviewed records since our last visit.  He is on Requip XL, which was increased to 6 mg at night last visit.  He changed that back to 2 mg tid of the regular ropinirole because of cost.   He is enrolled in physical therapy.  "I'm doing bad."  When asked to describe how he feels bad, he has trouble describing it.  He had a root canal and hasn't felt well since.  He hasn't been back to work for 4 weeks.  He feels that his speech is low and states that he went to voice therapy 2 weeks ago.  He isn't practicing the voice therapy faithfully.  He is  riding the bike on the highest resistance 20 mins a day.  He is having word finding trouble.   No falls but feels that he is "weaker."  He asks me about going to "light duty" at work and possibly retirement in the near future.  He  continues to see psychiatry for his depression.  He last saw psychiatry on 06/04/2015.  No changes were made in medication as he stated that mood was good and continues to reaffirm that.    11/30/15 update:  The patient follows up today, accompanied by his wife who supplements the history.  Last visit, his ropinirole was increased to 4 mg in the morning (8am but goes back to bed for another 1 hour or so), another 2 mg in the afternoon (1-2 pm) and evening (8-8:30pm).  I changed him to light duty at work last visit at his request.  He called me recently and wanted changed from being able to work 4 hours per day to a total of only 10 hours per week.  I told him that he would need a functional capacity evaluation for this, as I was not sure that this made sense from a Parkinson standpoint, given his stage of Parkinson's disease.  He was fairly frustrated with this recommendation, but I told him that I needed some objective evidence.  He reports that he is awaiting an appt for the FCE (has called).   He has been seeing psychiatry and I  actually received a call from his psychiatrist back in December that his psychiatrist felt that he needed to be on levodopa.  I did not think that was the case, but I was willing to do a levodopa challenge to see if that was the case.  He was scheduled to have that done, but the patient canceled that.  Does report that he thinks that depression under good control.   He denies falls.  Some lightheadedness when first gets up, but no near syncope.  Using bike daily for 15-20 minutes.  No hallucinations; rare visual distortions.  Wife thinks that he seems more stiff and has involuntary "tics and shaking" at night.  Waking up with AM headache.  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  03/29/16 update:  The patient follows up today, accompanied by his wife who supplements the history.  Last visit, his ropinirole was increased to 4 mg in the morning, 2 mg in the afternoon and 2 mg in the evening.  Has intermittent spells of increased stiffness, slower on stairs.  Riding recumbant bike at home.  He has been seeing psychiatry and is still on celexa and wellbutrin.  I reviewed last records with Dr. Adele Schilder and pt continued to c/o memory issues (long standing c/o) and was told to discuss with me as may be PD related.  Pt had neuropsych testing done about a year ago and demonstrated no neurodegenerative memory change related to parkinsons but did feel that depression was an issue affecting cognition.  Thinks that memory is worse. No falls since last visit.  He has attended PT/OT/ST from March-May.  He c/o drooling.  Also c/o talking at night and some tremor at night.  Happens most night.  Also c/o snoring and wife thinks that is due to weight and trying to cut back on sugars.  Has OSAS but doesn't use the CPAP.  Has some visual distortions but no hallucinations.  Can be frightening.  Having some urinary incontinence.  Saw urology in the past but not for incontinence.    05/30/16 update:  The patient follows up today, accompanied by his wife who supplements the history.  He is on ropinirole, 4 mg in the morning, 2 mg in the afternoon and 2 mg in the evening.  He denies compulsive behaviors.  He denies sleep attacks.  He is now attending the ACT gym and feels he is getting benefit from this.  He is getting scholarship information.  He had repeat neuropsych testing on August 2 with a follow-up with Dr. Si Raider on August 10.  This was largely consistent with his performance one year prior.  There is mild cognitive impairment consistent with Parkinson's disease and untreated obstructive sleep apnea syndrome.  There was evidence of major depressive disorder,  and Dr. Si Raider felt that there was long-standing personality issues that influenced the patient's view of the world in a negative fashion.  He saw urology since last visit and I did get a note from them.  It was not clear what medication he was tried on, just stated that he tried a beta 3 agonist (?  Myrbetriq).  Pt states that it was in fact myrbetriq that was added.  Pt states that it hasn't helped but wife thinks that he isn't getting up as much during the night.  He was getting up 3 times during the night and now he may get up 1-2 times during the night.  Having some dizziness when gets up for about 2 min.  Drinking 2 L of water a day.  Given up sugar completely.  I did order a split night PSG last time but results are not available.  He c/o increased drooling.  09/14/16 update:  Patient follows up today, accompanied by his wife who supplements the history.  He is on ropinirole, 4 mg in the morning, 2 mg in the afternoon and 2 mg in the evening.  Pt had one fall on the ice when it snowed but that was it.  Pt denies lightheadedness, near syncope.  No hallucinations.  Mood has been good.  He remains on a combination of Wellbutrin and Celexa for depression and thinks that this has been well controlled.  He saw psychiatry since our last visit.  He was referred to psychology for counseling.  He had a functional capacity evaluation on 09/01/2016.  Stated that the patient could exert up to 20 pounds of force occasionally and up to 10 pounds of force frequently, but it also stated that physical demand requirements are in excess of those for sedentary work.  It stated that any job that the patient should engage in should be rated "light work" when it requires walking or standing to a significant degree, when it requires sitting most of the time but entails pushing and/or pulling of leg or arm controls and/or when the job requires working at a production rate paced entailing constant pushing and/or pulling a materials  even if the weight of his materials is negligible.  It did state that based on the evaluation, the client was capable of sustaining light level of work for an 8 hour per day, 40 hour per week job.  Asks me about drooling.  Does not want to try botox.  Asks me about cough and swollen ankles.  C/o talking in sleep.  Will waken him out sleep.  Having trouble getting in and out of the cars (not so much his truck)..  Hits his head on the door jam.  Started PT last week.  Still very sleepy during the day.    12/06/16 update:  Patient has up today, accompanied by his wife who supplements the history.  Patient remains on ropinirole, 4 mg in the morning, 2 mg in the afternoon and 2 mg in the evening. Having  some illusions but once gets close on the object he will realize what it is.  Wife notes some increased tremor but patient doesn't.   He has 2 falls since our last visit.  One he tripped over a curb and the other he fell down stairs but he was close to the bottom and didn't get hurt.  He is working out and doing exercises.  He has issues with drooling, but does not want to pursue Myobloc, nor does he want Robinul.  He did attend rehabilitation since our last visit and I reviewed those records.  Still going to ACT bid.  Feels like speech is slurring.   04/10/17 update:  Patient seen today in follow-up, accompanied by his wife who supplements the history.  Patient is on ropinirole, 4 mg in the morning, 2 mg in the afternoon and 2 mg in the evening.  C/o ankle swelling.  Wondered if it was the [place].  I wanted to start him on carbidopa/levodopa 25/100, one tablet 3 times per day for last visit but he decided to hold on that. Interestingly, he saw Dr. Adele Schilder in June and I reviewed those records.  The patient had reported to him that he felt more stiff and tremulous and according to notes the psychiatrist told him that he thought he needed levodopa.   I was contacted by physical therapy that he was orthostatic in therapy.  He  came to our office for orthostatics and was not orthostatic that day.  We did give him a prescription for the abdominal compression Binder.  He isn't using that and didn't fill the RX.  We told him to increase hydration.  He is drinking 2 liters per day.  Is starting RSB today in [place].    07/17/17 update:  Patient seen today in follow-up for parkinsonism.  He is accompanied by his wife who supplements the history.  He is on ropinirole, 4 mg in the morning, 2 mg in the afternoon and 2 mg in the evening.  Last visit, we started carbidopa/levodopa 25/100, one tablet 3 times per day.  He has attended rehabilitation therapy since our last visit and those notes are reviewed.   He is still going to ACT gym and RSB.  Despite that, he feels weak and doesn't feel as strong as he was before last visit.  He doesn't describe it as dizziness.  He states that he doesn't have an opinion on efficacy of levodopa but wife thinks that it has been beneficial for tremor.  He has a fear of falling and that will slow his movement.  He has not had any actual falls.  Noted swelling of feet and ankles since august.  Some trouble with bladder incontinence and noting early issues with bowels as well.  States that his memory has gotten worse and forgot to take his medication this AM.   He continues to follow with psychiatry.  He is on Wellbutrin and Celexa.  11/13/17 update: Patient is seen today in follow-up.  He is accompanied by his wife who supplements the history.  Records made available to me have been reviewed.  We decreased his ropinirole last visit to 2 mg 3 times per day.  He remains on carbidopa/levodopa 25/100, 1 tablet 3 times per day.  He is still very active with rock steady boxing and ACT.  Pt states that he feels like the "Parkinsons is winning."   His wife brings a very long list of things she would like to discussed, one  of which is the fact that balance and movement seem to be more difficult, despite the exercise.  In  addition, stairs are getting more difficult.  He was evaluated by the rehab center November 08, 2017 and felt that it would be beneficial for him to restart therapy.  No falls, but he has a fearful reveals, shuffling more and he has swollen ankles, per wife.  Wife also complains that the patient has several episodes of nocturia (2-3 times per night).  He has seen his psychiatrist since last visit.  He also came in with his wife and met with our Education officer, museum.  He admitted to her that he did have increasing depression (not admitted to psychiatry per notes).  He wanted referral to a new counselor, because of insurance change she could not see his previous one.  He was given names.  He has started to see Pervis Hocking  Neuroimaging has previously been performed.  It is available for my review today and I reviewed it with him.  There is no BG disease.  ALLERGIES:  No Known Allergies  CURRENT MEDICATIONS:  Outpatient Encounter Medications as of 11/13/2017  Medication Sig  . B Complex-C (B-COMPLEX WITH VITAMIN C) tablet Take 1 tablet by mouth daily.  Marland Kitchen buPROPion (WELLBUTRIN XL) 300 MG 24 hr tablet TAKE 1 TABLET BY MOUTH EVERY DAY IN THE MORNING  . carbidopa-levodopa (SINEMET IR) 25-100 MG tablet TAKE 1 TABLET BY MOUTH THREE TIMES A DAY  . citalopram (CELEXA) 40 MG tablet Take 1 tablet (40 mg total) by mouth daily.  . fesoterodine (TOVIAZ) 4 MG TB24 tablet Take 1 tablet (4 mg total) by mouth daily.  . Omega 3-6-9 Fatty Acids (OMEGA 3-6-9 PO) Take by mouth.  Marland Kitchen rOPINIRole (REQUIP) 2 MG tablet Take 1 tablet (2 mg total) by mouth 3 (three) times daily. Take 2 tablets in the AM, 1 in the afternoon and 1 in the evening (Patient taking differently: Take 2 mg by mouth 3 (three) times daily. )  . sildenafil (VIAGRA) 100 MG tablet Take 0.5-1 tablets (50-100 mg total) by mouth daily as needed for erectile dysfunction.  . TURMERIC CURCUMIN PO Take by mouth.  . [DISCONTINUED] fluticasone (FLONASE) 50 MCG/ACT nasal spray  Place 2 sprays into both nostrils daily. (Patient not taking: Reported on 11/08/2017)   No facility-administered encounter medications on file as of 11/13/2017.     PAST MEDICAL HISTORY:   Past Medical History:  Diagnosis Date  .  OSA (obstructive sleep apnea) 01/17/2011   npsg 2012:  AHI 67/hr. Auto titration 2012:  Optimal pressure 12cm.   . APPENDECTOMY, HX OF 02/18/2008   Qualifier: Diagnosis of  By: Swan, [place]    . Cardiac murmur    as a child  . Depression   . Headache(784.0)   . HEADACHES, HX OF 02/18/2008   Qualifier: Diagnosis of  By: Danny Lawless CMA, [place]    . Parkinson disease ([place]) 11/2014  . Streptococcal meningitis    as an infant    PAST SURGICAL HISTORY:   Past Surgical History:  Procedure Laterality Date  . APPENDECTOMY  1967  . NASAL SINUS SURGERY     x 4 as a child  . VASECTOMY      SOCIAL HISTORY:   Social History   Socioeconomic History  . Marital status: Married    Spouse name: Not on file  . Number of children: Y  . Years of education: Not on file  . Highest education level: Not  on file  Social Needs  . Financial resource strain: Not on file  . Food insecurity - worry: Not on file  . Food insecurity - inability: Not on file  . Transportation needs - medical: Not on file  . Transportation needs - non-medical: Not on file  Occupational History  . Occupation: Armed forces operational officer: [employer]    Comment: Special educational needs teacher  Tobacco Use  . Smoking status: Never Smoker  . Smokeless tobacco: Never Used  Substance and Sexual Activity  . Alcohol use: No    Alcohol/week: 0.0 oz  . Drug use: No  . Sexual activity: Yes    Birth control/protection: None  Other Topics Concern  . Not on file  Social History Narrative  . Not on file    FAMILY HISTORY:   Family Status  Relation Name Status  . Father  Deceased       lung cancer, alzheimer's  . Mother  Alive       HTN, A fib  . Brother  Deceased       accident  . Sister  Alive       healthy    . Son  Alive       healthy  . Daughter  Alive       healthy  . Daughter  Alive       healthy  . Mat Aunt  Alive       Parkinson's Disease    ROS:  A complete 10 system review of systems was obtained and was unremarkable apart from what is mentioned above.  PHYSICAL EXAMINATION:    VITALS:   Vitals:   11/13/17 0810  BP: 104/60  Pulse: 76  SpO2: 96%  Weight: 300 lb (136.1 kg)  Height: '6\' 2"'$  (1.88 m)   Wt Readings from Last 3 Encounters:  11/13/17 300 lb (136.1 kg)  10/09/17 (!) 304 lb (137.9 kg)  08/16/17 (!) 306 lb (138.8 kg)     GEN:  The patient appears stated age and is in NAD. HEENT:  Normocephalic, atraumatic.  The mucous membranes are moist. The superficial temporal arteries are without ropiness or tenderness. CV:  RRR Lungs:  CTAB Neck/HEME:  There are no carotid bruits bilaterally. MS:  Has minor LE edema  Neurological examination:  Orientation: The patient is alert and oriented x3.  Cranial nerves: There is good facial symmetry. 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   The speech is fluent and clear.  He is hypophonic.  The patient is able to make the gutteral sounds without difficulty. Soft palate rises symmetrically and there is no tongue deviation. Hearing is intact to conversational tone. Sensation: Sensation is intact to light touch throughout Motor: Strength is at least antigravity today  Movement examination: Tone: There is normal tone bilaterally Abnormal movements: There is no tremor today Coordination:  There is decremation with RAM's, seen with hand opening and closing on the left, finger taps on the bilaterally.  He is better with alternation of supination/pronation of the forearm.  Toe taps are slow on the left. Gait and Station: The patient has mild difficulty arising out of a deep-seated chair without the use of the hands.   The patient's stride length is decreased with decreased arm swing on the L.  He is short stepped.    Labs:     Chemistry      Component Value Date/Time   NA 138 10/09/2017 1138   K 4.3 10/09/2017 1138   CL 104  10/09/2017 1138   CO2 28 10/09/2017 1138   BUN 16 10/09/2017 1138   CREATININE 1.14 10/09/2017 1138      Component Value Date/Time   CALCIUM 9.3 10/09/2017 1138   ALKPHOS 66 10/09/2017 1138   AST 13 10/09/2017 1138   ALT 21 10/09/2017 1138   BILITOT 0.6 10/09/2017 1138     Lab Results  Component Value Date   HGBA1C 6.1 10/09/2017      ASSESSMENT/PLAN:  1.  Parkinsonism  - He and I talked about whether or not we should decrease requip since it can affect cognition and can affect swelling, although he swelling developed long after requip.  We will decrease requip from requip '4mg'$ /'2mg'$ /'2mg'$  to 2 mg tid.  Risks, benefits, side effects and alternative therapies were discussed.  The opportunity to ask questions was given and they were answered to the best of my ability.  The patient expressed understanding and willingness to follow the outlined treatment protocols.  -continue carbidopa/levodopa 25/100 but change from 7am/1pm/7pm to 7am/11am/4pm  -Wife complains about continued balance issues (no falls) despite exercising with ACT.  He is scheduled to start therapy again at the neuro rehab center November 30, 2017.  -invited to drumming  -talked about how rhythmic music can help freezing (discussed ear buds and music) 2  Depression  -The patient reports that this has been "resolved."  He is not suicidal or homicidal.  He will remain on Celexa and Wellbutrin and remain under the care of psychiatry.  He asked me about that and would recommend continue.    -called to see Pervis Hocking for psychiatry.  Seen one time and starts regular in March.   3.  Sialorrhea  -refuses botox but has asked me multiple times about what to do about this.  Will let me know if changes mind  -offered robinul but doesn't want to to try it. 4.  Dizziness and occasional Orthostatic hypotension  -drinking plenty of fluid  and doing well with that.  -has abdominal compression binder but isn't using.  5.  Memory loss  -He had repeat neuropsych testing on August 2 with a follow-up with Dr. Si Raider on May 12, 2016.  This was largely consistent with his performance one year prior.  There is mild cognitive impairment consistent with Parkinson's disease and untreated obstructive sleep apnea syndrome.  There was evidence of major depressive disorder, and Dr. Si Raider felt that there was long-standing personality issues that influenced the patient's view of the world in a negative fashion.  There was not evidence of dementia at this point in time. 6.  REM behavior d/o  -just talking at night but nothing unsafe  -had PSG in 05/2016 with AHI  5.6.  Having EDS but wife refuses to have him repeat that and pt agrees with her.   7.  Urinary incontinence, frequency and nocturia  -Wife asked me about this again today.  We have discussed this at previous visits.  He is already following with urology at [facility] urology but admits that hasn't been there in a long time (about a year).  Told to make a follow appointment.  She asks about leg swelling.  Urology had previously stopped the Toviaz to see if it would help the swelling, but his bladder function got worse and it was restarted.   8.  Follow up is anticipated in the next few months, sooner should new neurologic issues arise.  Much greater than 50% of this visit was spent in counseling and coordinating  care.  Total face to face time:  30 min

## 2017-11-08 NOTE — Therapy (Signed)
[facility] [address] [place], [place], [address] Phone: [phone]   Fax:  [phone]  Physical Therapy Evaluation  Patient Details  Name: [name] [name] MRN: [id number] Date of Birth: 1951-01-07 Referring Provider: Dr. [name] Tat   Encounter Date: 11/08/2017  PT End of Session - 11/08/17 1607    Visit Number  1    Number of Visits  17    Date for PT Re-Evaluation  01/07/18    Authorization Type  [employer]-75 visit limit combined PT, OT, speech    PT Start Time  0804    PT Stop Time  0847    PT Time Calculation (min)  43 min    Activity Tolerance  Patient tolerated treatment well    Behavior During Therapy  [facility] for tasks assessed/performed       Past Medical History:  Diagnosis Date  .  OSA (obstructive sleep apnea) 01/17/2011   npsg 2012:  AHI 67/hr. Auto titration 2012:  Optimal pressure 12cm.   . APPENDECTOMY, HX OF 02/18/2008   Qualifier: Diagnosis of  By: [place], [place]    . Cardiac murmur    as a child  . Depression   . Headache(784.0)   . HEADACHES, HX OF 02/18/2008   Qualifier: Diagnosis of  By: Danny Lawless CMA, [place]    . Parkinson disease ([place]) 11/2014  . Streptococcal meningitis    as an infant    Past Surgical History:  Procedure Laterality Date  . APPENDECTOMY  1967  . NASAL SINUS SURGERY     x 4 as a child  . VASECTOMY      There were no vitals filed for this visit.   Subjective Assessment - 11/08/17 0277    Subjective  Not doing as well-probably around November or December, noted more slowing, more freezing, and more fear of falling.  Have had 4 falls-one tripped on the stairs.  Just feel like I'm more cautious    Patient Stated Goals  Pt's goals for therapy are to be doing exercise and to improve balance.    Currently in Pain?  No/denies         [facility] PT Assessment - 11/08/17 4128      Assessment   Medical Diagnosis  Parkinsons Disease    Referring Provider   [name] Tat, DO    Onset Date/Surgical Date  -- last bout of therapy ended 05/05/17      Precautions   Precautions  Fall      Balance Screen   Has the patient fallen in the past 6 months  Yes    How many times?  4    Has the patient had a decrease in activity level because of a fear of falling?   Yes    Is the patient reluctant to leave their home because of a fear of falling?   No      Home Environment   Living Environment  Private residence    Living Arrangements  Spouse/significant other    Available Help at Discharge  Family    Type of [place] to enter    Entrance Stairs-Number of Steps  5    Entrance Stairs-Rails  Right    Home Layout  Two level;Bed/bath upstairs    Alternate Level Stairs-Number of Steps  12    Alternate Level Stairs-Rails  Right      Prior Function   Level of Independence  Independent with basic ADLs;Independent with household mobility without device;Independent with community mobility without device    Vocation  Retired    Leisure  goes to [employer] 2x/wk, then [employer] 3x/wk.      Cognition   Overall Cognitive Status  History of cognitive impairments - at baseline      Observation/Other Assessments   Focus on Therapeutic Outcomes (FOTO)   NA      Posture/Postural Control   Posture/Postural Control  Postural limitations    Postural Limitations  Rounded Shoulders;Forward head      ROM / Strength   AROM / PROM / Strength  Strength      Strength   Overall Strength Comments  Grossly tested at least 4+/5 with MMT of lower extremities      Bed Mobility   Bed Mobility  -- Bed mobility NA due to pt sleeping in recliner      Transfers   Transfers  Sit to Stand;Stand to Sit    Sit to Stand  6: Modified independent (Device/Increase time);Without upper extremity assist;From chair/3-in-1    Five time sit to stand comments   11.25 hesitation on first attempt    Stand to Sit  6: Modified independent (Device/Increase time);Without  upper extremity assist;To chair/3-in-1      Ambulation/Gait   Ambulation/Gait  Yes    Ambulation/Gait Assistance  6: Modified independent (Device/Increase time)    Ambulation Distance (Feet)  200 Feet    Assistive device  None    Gait Pattern  Step-through pattern;Decreased arm swing - right;Decreased arm swing - left;Decreased step length - right;Decreased step length - left;Decreased trunk rotation;Trunk flexed;Poor foot clearance - left    Ambulation Surface  Level;Indoor    Gait velocity  18.93 sec = 1.73 ft/sec      Standardized Balance Assessment   Standardized Balance Assessment  Timed Up and Go Test      Timed Up and Go Test   Normal TUG (seconds)  14.91    Manual TUG (seconds)  15.72    Cognitive TUG (seconds)  19.19    TUG Comments  Scores >13.5-15 seconds indicates increased fall risk.      High Level Balance   High Level Balance Comments  MiniBESTest score 12/28 (see note for details), but difficulty with rise to toes, SLS, pt does not step with perturbations (Has very slowed hip/ankle strategy, no step but does not fall), difficulty EC feet together on foam, gait with turns, obstacles.     ABC Scale Score:  57.5%  Mini-BESTest: Balance Evaluation Systems Test  2005-2013 [place]. All rights reserved. ________________________________________________________________________________________Anticipatory_________Subscore___2__/6 1. SIT TO STAND Instruction: "Cross your arms across your chest. Try not to use your hands unless you must.Do not let your legs lean against the back of the chair when you stand. Please stand up now." X(2) Normal: Comes to stand without use of hands and stabilizes independently. (1) Moderate: Comes to stand WITH use of hands on first attempt. (0) Severe: Unable to stand up from chair without assistance, OR needs several attempts with use of hands. 2. RISE TO TOES Instruction: "Place your feet shoulder width apart. Place  your hands on your hips. Try to rise as high as you can onto your toes. I will count out loud to 3 seconds. Try to hold this pose for at least 3 seconds. Look straight ahead. Rise now." (2) Normal: Stable for 3 s with maximum height. (1) Moderate: Heels up, but not full range (  smaller than when holding hands), OR noticeable instability for 3 s. X(0) Severe: < 3 s. 3. STAND ON ONE LEG Instruction: "Look straight ahead. Keep your hands on your hips. Lift your leg off of the ground behind you without touching or resting your raised leg upon your other standing leg. Stay standing on one leg as long as you can. Look straight ahead. Lift now." Left: Time in Seconds Trial 1:_____Trial 2:_____ (2) Normal: 20 s. (1) Moderate: < 20 s. X(0) Severe: Unable. Right: Time in Seconds Trial 1:__9.65___Trial 2:_1.22____ (2) Normal: 20 s. X(1) Moderate: < 20 s. (0) Severe: Unable To score each side separately use the trial with the longest time. To calculate the sub-score and total score use the side [left or right] with the lowest numerical score [i.e. the worse side]. ______________________________________________________________________________________Reactive Postural Control___________Subscore:__0___/6 4. COMPENSATORY STEPPING CORRECTION- FORWARD Instruction: "Stand with your feet shoulder width apart, arms at your sides. Lean forward against my hands beyond your forward limits. When I let go, do whatever is necessary, including taking a step, to avoid a fall." (2) Normal: Recovers independently with a single, large step (second realignment step is allowed). (1) Moderate: More than one step used to recover equilibrium. X(0) Severe: No step, OR would fall if not caught, OR falls spontaneously. 5. COMPENSATORY STEPPING CORRECTION- BACKWARD Instruction: "Stand with your feet shoulder width apart, arms at your sides. Lean backward against my hands beyond your backward limits. When I let go, do whatever is  necessary, including taking a step, to avoid a fall." (2) Normal: Recovers independently with a single, large step. (1) Moderate: More than one step used to recover equilibrium. X(0) Severe: No step, OR would fall if not caught, OR falls spontaneously. 6. COMPENSATORY STEPPING CORRECTION- LATERAL Instruction: "Stand with your feet together, arms down at your sides. Lean into my hand beyond your sideways limit. When I let go, do whatever is necessary, including taking a step, to avoid a fall." Left (2) Normal: Recovers independently with 1 step (crossover or lateral OK). (1) Moderate: Several steps to recover equilibrium. X(0) Severe: Falls, or cannot step. Right (2) Normal: Recovers independently with 1 step (crossover or lateral OK). X(1) Moderate: Several steps to recover equilibrium. (0) Severe: Falls, or cannot step. Use the side with the lowest score to calculate sub-score and total score. ____________________________________________________________________________________Sensory Orientation_____________Subscore:_____4____/6 7. STANCE (FEET TOGETHER); EYES OPEN, FIRM SURFACE Instruction: "Place your hands on your hips. Place your feet together until almost touching. Look straight ahead. Be as stable and still as possible, until I say stop." Time in seconds:________ X(2) Normal: 30 s. (1) Moderate: < 30 s. (0) Severe: Unable. 8. STANCE (FEET TOGETHER); EYES CLOSED, FOAM SURFACE Instruction: "Step onto the foam. Place your hands on your hips. Place your feet together until almost touching. Be as stable and still as possible, until I say stop. I will start timing when you close your eyes." Time in seconds:____8____ (2) Normal: 30 s. X(1) Moderate: < 30 s. (0) Severe: Unable. 9. INCLINE- EYES CLOSED Instruction: "Step onto the incline ramp. Please stand on the incline ramp with your toes toward the top. Place your feet shoulder width apart and have your arms down at your sides. I  will start timing when you close your eyes." Time in seconds:________ (2) Normal: Stands independently 30 s and aligns with gravity. X(1) Moderate: Stands independently <30 s OR aligns with surface. (0) Severe: Unable. _________________________________________________________________________________________Dynamic Gait ______Subscore____6____/10 10. CHANGE IN GAIT SPEED Instruction: "Begin walking at your normal speed,  when I tell you 'fast', walk as fast as you can. When I say 'slow', walk very slowly." X(2) Normal: Significantly changes walking speed without imbalance. (1) Moderate: Unable to change walking speed or signs of imbalance. (0) Severe: Unable to achieve significant change in walking speed AND signs of imbalance. [place] - HORIZONTAL Instruction: "Begin walking at your normal speed, when I say "right", turn your head and look to the right. When I say "left" turn your head and look to the left. Try to keep yourself walking in a straight line." (2) Normal: performs head turns with no change in gait speed and good balance. X(1) Moderate: performs head turns with reduction in gait speed. (0) Severe: performs head turns with imbalance. 12. WALK WITH PIVOT TURNS Instruction: "Begin walking at your normal speed. When I tell you to 'turn and stop', turn as quickly as you can, face the opposite direction, and stop. After the turn, your feet should be close together." (2) Normal: Turns with feet close FAST (< 3 steps) with good balance. X(1) Moderate: Turns with feet close SLOW (>4 steps) with good balance. (0) Severe: Cannot turn with feet close at any speed without imbalance. 13. STEP OVER OBSTACLES Instruction: "Begin walking at your normal speed. When you get to the box, step over it, not around it and keep walking." (2) Normal: Able to step over box with minimal change of gait speed and with good balance. X(1) Moderate: Steps over box but touches box OR displays  cautious behavior by slowing gait. (0) Severe: Unable to step over box OR steps around box. 14. TIMED UP & GO WITH DUAL TASK [3 METER WALK] Instruction TUG: "When I say 'Go', stand up from chair, walk at your normal speed across the tape on the floor, turn around, and come back to sit in the chair." Instruction TUG with Dual Task: "Count backwards by threes starting at ___. When I say 'Go', stand up from chair, walk at your normal speed across the tape on the floor, turn around, and come back to sit in the chair. Continue counting backwards the entire time." TUG: ________seconds; Dual Task TUG: ________seconds (2) Normal: No noticeable change in sitting, standing or walking while backward counting when compared to TUG without Dual Task. X(1) Moderate: Dual Task affects either counting OR walking (>10%) when compared to the TUG without Dual Task. (0) Severe: Stops counting while walking OR stops walking while counting. When scoring item 14, if subject's gait speed slows more than 10% between the TUG without and with a Dual Task the score should be decreased by a point. TOTAL SCORE: ___12_____/28         Objective measurements completed on examination: See above findings.                PT Short Term Goals - 11/08/17 1617      PT SHORT TERM GOAL #1   Title  Pt will be independent with HEP to target Parkinson's specific deficits.  TARGET 12/08/17    Time  4    Period  Weeks    Status  New    Target Date  12/08/17      PT SHORT TERM GOAL #2   Title  Pt will improve TUG score to less than or equal to 13.5 seconds for decreased fall risk.    Time  4    Period  Weeks    Status  New    Target Date  12/08/17  PT SHORT TERM GOAL #3   Title  Pt will perform sit<>stand transfers at least 8 of 10 reps with no hesitation and posterior lean.    Time  4    Period  Weeks    Status  New    Target Date  12/08/17      PT SHORT TERM GOAL #4   Title  Pt will improve  MiniBESTest score to at least 15/28 for decreased fall risk.    Time  4    Period  Weeks    Status  New    Target Date  12/08/17      PT SHORT TERM GOAL #5   Title  Pt will improve ABC scale score by at least 10% to demonstrate improved balance confidence with daily activities.    Baseline  Eval:  57.5%    Time  4    Period  Weeks    Status  New    Target Date  12/08/17        PT Long Term Goals - 11/08/17 1622      PT LONG TERM GOAL #1   Title  Pt will verbalize understanding of fall prevention/tips to reduce freezing with gait.  TARGET 01/05/18    Time  8    Period  Weeks    Status  New    Target Date  01/05/18      PT LONG TERM GOAL #2   Title  Pt will improve TUG cognitive to less than or equal to 15 seconds for decreased fall risk/improved dual tasking with gait.    Time  8    Period  Weeks    Status  New    Target Date  01/05/18      PT LONG TERM GOAL #3   Title  Pt will improve MiniBESTest score to at least 19/28 for decreased fall risk.    Time  8    Period  Weeks    Status  New    Target Date  01/05/18      PT LONG TERM GOAL #4   Title  Pt will improve gait velocity to at least 2 ft/sec for improved gait efficiency and safety in community    Time  8    Period  Weeks    Status  New    Target Date  01/05/18      PT LONG TERM GOAL #5   Title  Pt will ambulate at least 1000 ft, indoor and outdoor surfaces, including ramps and inclines, with least restrictive assistive device modified independently for improved safety with outdoor gait.    Time  8    Period  Weeks    Status  New    Target Date  01/05/18             Plan - 11/08/17 1609    Clinical Impression Statement  Pt is a 67 year old male who presents to OP PT for follow up PT evaluation since PT discharge in August 2018.  Pt feels he was doing well until Nov-December, when he noted more slowed movement patterns, more freezing of gait, decreased balance confidence, and pt has had 4 falls in past 6  months.  Pt presents with abnormal posture, postural instability, bradykinesia, decreased balance, decreased timing and coordination of gait, festination/freezing of gait and turns at times, recent history of falls.  He has slowed in all mobility measures since d/c from PT in 05/2017.  He participates in exercise  classes almost daily and volunteers at his church.  Pt will benefit from skilled PT to address the above stated deficits to decrease fall risk and to improve functional mobility.    History and Personal Factors relevant to plan of care:  4 falls in past 6 months, PMH includes depression, OSA, syncope, hyperglycemia; > 3 systems involved    Clinical Presentation  Evolving    Clinical Presentation due to:  hx of PD, slowed mobility measures on TUG, TUG cognitive, 5x sit<>sta, gait velocity, MiniBEStest; at risk of falls per TUG, MiniBESTest and gait velocity    Clinical Decision Making  Moderate    Rehab Potential  Good    Clinical Impairments Affecting Rehab Potential  Memory deficits per speech eval notes    PT Frequency  2x / week    PT Duration  8 weeks plus eval    PT Treatment/Interventions  ADLs/Self Care Home Management;DME Instruction;Gait training;Stair training;Functional mobility training;Therapeutic activities;Therapeutic exercise;Balance training;Neuromuscular re-education;Patient/family education    PT Next Visit Plan  Balance strategy work, sit<>stand, gait activities for foot clearance    Consulted and Agree with Plan of Care  Patient       Patient will benefit from skilled therapeutic intervention in order to improve the following deficits and impairments:  Abnormal gait, Decreased balance, Decreased mobility, Difficulty walking, Postural dysfunction  Visit Diagnosis: Other abnormalities of gait and mobility  Unsteadiness on feet  Abnormal posture  Other symptoms and signs involving the nervous system     Problem List Patient Active Problem List   Diagnosis Date  Noted  . TMJ (temporomandibular joint syndrome) 10/10/2017  . Parkinson's disease ([place]) 03/20/2017  . Bilateral lower extremity edema 03/20/2017  . Chronic venous insufficiency 09/13/2016  . Fatigue 03/15/2016  . Parkinsonian features 09/15/2015  . ED (erectile dysfunction) 09/15/2015  . MDD (major depressive disorder), recurrent episode, severe ([place]) 02/10/2014  . Nonspecific abnormal electrocardiogram (ECG) (EKG) 02/07/2014  . Non-compliant behavior 02/03/2014  . Morton's neuroma of left foot 08/07/2013  . 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 Hyperglycemia 01/26/2012  . Hypogonadism male 01/24/2012  . Syncope and collapse 01/20/2012  .  OSA (obstructive sleep apnea) 01/17/2011  . CHEST TIGHTNESS 02/18/2008    Gizella Belleville W. 11/08/2017, 4:25 PM  Frazier Butt., PT   [facility] [address] [place], [place], [address] Phone: [phone]   Fax:  [phone]  Name: [name] [name] MRN: [id number] Date of Birth: 1950/11/24

## 2017-11-09 NOTE — Therapy (Signed)
[facility] [address] [place], [place], [address] Phone: [phone]   Fax:  [phone]  Occupational Therapy Treatment  Patient Details  Name: [name] [name] MRN: [id number] Date of Birth: 1951/06/23 Referring Provider: Dr. Carles Collet   Encounter Date: 11/08/2017  OT End of Session - 11/09/17 1219    Visit Number  1    Number of Visits  17    Date for OT Re-Evaluation  11/09/17    Authorization Type  BCBS    Authorization Time Period  75 visit limit combined     Authorization - Visit Number  1    Authorization - Number of Visits  25    OT Start Time  0850    OT Stop Time  0930    OT Time Calculation (min)  40 min    Activity Tolerance  Patient tolerated treatment well    Behavior During Therapy  [facility] for tasks assessed/performed       Past Medical History:  Diagnosis Date  .  OSA (obstructive sleep apnea) 01/17/2011   npsg 2012:  AHI 67/hr. Auto titration 2012:  Optimal pressure 12cm.   . APPENDECTOMY, HX OF 02/18/2008   Qualifier: Diagnosis of  By: [place], [place]    . Cardiac murmur    as a child  . Depression   . Headache(784.0)   . HEADACHES, HX OF 02/18/2008   Qualifier: Diagnosis of  By: Danny Lawless CMA, [place]    . Parkinson disease ([place]) 11/2014  . Streptococcal meningitis    as an infant    Past Surgical History:  Procedure Laterality Date  . APPENDECTOMY  1967  . NASAL SINUS SURGERY     x 4 as a child  . VASECTOMY      There were no vitals filed for this visit.  Subjective Assessment - 11/08/17 0852    Subjective   Pt reports occasional back pain, no pain now    Pertinent History  Parkinson's disease, seeing illusions, hx of falls, hx of significant depression, mild cognitive impairment, sleep apnea, hypogonadism, ADD    Patient Stated Goals  improved balance and ease with ADLs    Currently in Pain?  No/denies         [facility] OT Assessment - 11/09/17 0001      Assessment   Medical  Diagnosis  Parkinsons Disease    Referring Provider  Dr. Carles Collet    Onset Date/Surgical Date  10/16/17 referral date    Prior Therapy  Rehab off and on since early 2017 and now in new episode of cre      Precautions   Precautions  Fall      Balance Screen   Has the patient fallen in the past 6 months  Yes    How many times?  4    Has the patient had a decrease in activity level because of a fear of falling?   No    Is the patient reluctant to leave their home because of a fear of falling?   No      Home  Environment   Family/patient expects to be discharged to:  Private residence    Lives With  Spouse      Prior Function   Level of Independence  Independent with basic ADLs;Independent with household mobility without device;Independent with community mobility without device    Vocation  Retired    Leisure  goes to [employer] 2x/wk, then [employer] 3x/wk.  ADL   Eating/Feeding  Modified independent    Grooming  Modified independent    Upper Body Bathing  Modified independent increased time    Lower Body Bathing  Modified independent    Upper Body Dressing  Increased time difficulty donning jacket    Lower Body Dressing  Minimal assistance socks , pants    Toilet Transfer  Modified independent    Tub/Shower Transfer  Independent    ADL comments  Increased time required for dressing, pt needs occaisional assist  with LB dressing      IADL   Shopping  Needs to be accompanied on any shopping trip    Light Housekeeping  Performs light daily tasks such as dishwashing, bed making    Meal Prep  Able to complete simple cold meal and snack prep    Medication Management  Is responsible for taking medication in correct dosages at correct time    Prior Level of Function Financial Management  wife performs      Mobility   Mobility Status  Freezing;History of falls      Written Expression   Handwriting  100% legible;Increased time Pt reports increased micrographia at time      Vision -  History   Baseline Vision  Bifocals      Cognition   Overall Cognitive Status  Impaired/Different from baseline    Area of Impairment  Attention;Memory    Memory  Decreased short-term memory    Memory  Impaired    Memory Impairment  Decreased short term memory      Observation/Other Assessments   Standing Functional Reach Test  RUE 9.5 in, LUE 8.5 in    Simulated Eating Time (seconds)  12.71 secs    Donning Doffing Jacket Time (seconds)  31.91 secs    Donning Doffing Jacket Comments  Fastening/unfastening 3 buttons 57.34 secs      Posture/Postural Control   Posture/Postural Control  Postural limitations    Postural Limitations  Rounded Shoulders;Forward head      Coordination   Gross Motor Movements are Fluid and Coordinated  No    Fine Motor Movements are Fluid and Coordinated  No    Right 9 Hole Peg Test  32.31    Left 9 Hole Peg Test  31.37    Box and Blocks  RUE 54, LUE 50      AROM   Overall AROM   Within functional limits for tasks performed    Overall AROM Comments  Mildly decreased bilateral supination 90%, -10 right elbow extension      RUE Tone   RUE Tone  Mild      LUE Tone   LUE Tone  Mild                         OT Short Term Goals - 11/09/17 1248      OT SHORT TERM GOAL #1   Title  Pt will be independent with PD specific  HEP.--   Status  New      OT SHORT TERM GOAL #2   Title  Pt will verbalize understanding of ways to prevent future complications and current available/appropriate community resources prn.    Time  4    Status  New      OT SHORT TERM GOAL #3   Title  Pt will improve bilateral hand coordination as shown by fastening/unfastening 3 buttons in 50sec or less.    Baseline  57.34  Time  4    Period  Weeks    Status  New      OT SHORT TERM GOAL #4   Title  Pt will report incr ease with donning pants socks/shoes using adaptive strategies/AE prn.    Time  4    Status  New        OT Long Term Goals - 11/09/17  1249      OT LONG TERM GOAL #1   Title  Pt will verbalize understanding of adapted strategies for ADLs/IADLs--   Time  8    Period  Weeks    Status  New      OT LONG TERM GOAL #2   Title  Pt will demonstrate improved functional standing balance as evidenced by increasing bilateral  standing functional reach to 10 inches or greater    Baseline  RUE 9.5 inches, LUE 8.5 in    Time  8    Period  Weeks    Status  New      OT LONG TERM GOAL #3   Title  Pt will improve bilateral hand coordination as shown by fastening/unfastening 3 buttons in 45sec or less.    Time  8    Period  Weeks    Status  New            Plan - 11/09/17 1235    Clinical Impression Statement  Pt is a 67 y.o. male with Parkinson's disease with diagnosis of Parkinson's disease who returnes to occupational therapy with a decline in ADL/IADLS Pt presents today with bradykinesia, rigidity, decr functional mobility/balance for ADLs/IADLs, decr coordination, cognitive deficits, abnormal posture. Pt would benefit from occupational therapy to address these deficits in order to prevent future complications and improve ADL/IADL performanc (incr ease and safety).     Occupational Profile and client history currently impacting functional performance  PMH that includes: seeing illusions, hx of falls, hx of significant depression, mild cognitive impairment, sleep apnea, hypogonadism, ADD. Pt retired due to difficulty at work and no longer drives. Pt reports slowing of ADLs and incr cognitive deficits impacting function.     Occupational performance deficits (Please refer to evaluation for details):  ADL's;IADL's;Leisure;Social Participation    Rehab Potential  Good    Current Impairments/barriers affecting progress:  cognitive deficits, depression    OT Frequency  2x / week plus eval or 17 visits total x 12 weeks    OT Duration  8 weeks    OT Treatment/Interventions  Self-care/ADL training;Moist Heat;Fluidtherapy;Balance  training;Therapeutic activities;Cognitive remediation/compensation;Therapeutic exercise;Ultrasound;Cryotherapy;Neuromuscular education;Visual/perceptual remediation/compensation;Passive range of motion;Functional Mobility Training;Patient/family education;Manual Therapy;Energy conservation;Paraffin    Plan  initiate HEP, adapted strategies for ADLS    Consulted and Agree with Plan of Care  Patient       Patient will benefit from skilled therapeutic intervention in order to improve the following deficits and impairments:  Decreased cognition, Impaired flexibility, Decreased mobility, Decreased coordination, Decreased endurance, Decreased range of motion, Decreased strength, Impaired UE functional use, Impaired tone, Impaired perceived functional ability, Decreased safety awareness, Difficulty walking, Decreased balance, Decreased activity tolerance, Impaired vision/preception, Improper spinal/pelvic alignment  Visit Diagnosis: Other lack of coordination - Plan: Ot plan of care cert/re-cert  Other abnormalities of gait and mobility - Plan: Ot plan of care cert/re-cert  Unsteadiness on feet - Plan: Ot plan of care cert/re-cert  Abnormal posture - Plan: Ot plan of care cert/re-cert  Other symptoms and signs involving the nervous system - Plan: Ot plan of care cert/re-cert  Other symptoms and signs involving the musculoskeletal system - Plan: Ot plan of care cert/re-cert  Attention and concentration deficit - Plan: Ot plan of care cert/re-cert    Problem List Patient Active Problem List   Diagnosis Date Noted  . TMJ (temporomandibular joint syndrome) 10/10/2017  . Parkinson's disease ([place]) 03/20/2017  . Bilateral lower extremity edema 03/20/2017  . Chronic venous insufficiency 09/13/2016  . Fatigue 03/15/2016  . Parkinsonian features 09/15/2015  . ED (erectile dysfunction) 09/15/2015  . MDD (major depressive disorder), recurrent episode, severe ([place]) 02/10/2014  . Nonspecific abnormal  electrocardiogram (ECG) (EKG) 02/07/2014  . Non-compliant behavior 02/03/2014  . Morton's neuroma of left foot 08/07/2013  . 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 Hyperglycemia 01/26/2012  . Hypogonadism male 01/24/2012  . Syncope and collapse 01/20/2012  .  OSA (obstructive sleep apnea) 01/17/2011  . CHEST TIGHTNESS 02/18/2008    RINE,KATHRYN 11/09/2017, 12:57 PM Theone Murdoch, OTR/L [phone] Phone: [phone] 12:57 PM 11/09/17 [facility] [address] [place], [place], [address] Phone: [phone]   Fax:  [phone]  Name: [name] [name] MRN: [id number] Date of Birth: November 16, 1950

## 2017-11-13 ENCOUNTER — Encounter: Payer: Self-pay | Admitting: Neurology

## 2017-11-13 ENCOUNTER — Ambulatory Visit (INDEPENDENT_AMBULATORY_CARE_PROVIDER_SITE_OTHER): Payer: Federal, State, Local not specified - PPO | Admitting: Neurology

## 2017-11-13 VITALS — BP 104/60 | HR 76 | Ht 74.0 in | Wt 300.0 lb

## 2017-11-13 DIAGNOSIS — G20 Parkinson's disease: Secondary | ICD-10-CM | POA: Diagnosis not present

## 2017-11-13 DIAGNOSIS — G4710 Hypersomnia, unspecified: Secondary | ICD-10-CM | POA: Diagnosis not present

## 2017-11-13 NOTE — Patient Instructions (Addendum)
1. Take Carbidopa Levodopa at 7 am, 11 am, and 4 pm.   2. Make appt with Dr. McDiarmid at [facility] [phone].   3. Proceed with therapies as scheduled at the Rehab center.

## 2017-11-30 ENCOUNTER — Ambulatory Visit: Payer: Federal, State, Local not specified - PPO | Admitting: Physical Therapy

## 2017-11-30 ENCOUNTER — Encounter: Payer: Self-pay | Admitting: Physical Therapy

## 2017-11-30 ENCOUNTER — Encounter: Payer: Self-pay | Admitting: Occupational Therapy

## 2017-11-30 ENCOUNTER — Ambulatory Visit: Payer: Federal, State, Local not specified - PPO | Admitting: Speech Pathology

## 2017-11-30 ENCOUNTER — Ambulatory Visit: Payer: Federal, State, Local not specified - PPO | Admitting: Occupational Therapy

## 2017-11-30 DIAGNOSIS — R2689 Other abnormalities of gait and mobility: Secondary | ICD-10-CM

## 2017-11-30 DIAGNOSIS — R2681 Unsteadiness on feet: Secondary | ICD-10-CM

## 2017-11-30 DIAGNOSIS — R41841 Cognitive communication deficit: Secondary | ICD-10-CM

## 2017-11-30 DIAGNOSIS — R29818 Other symptoms and signs involving the nervous system: Secondary | ICD-10-CM

## 2017-11-30 DIAGNOSIS — R29898 Other symptoms and signs involving the musculoskeletal system: Secondary | ICD-10-CM

## 2017-11-30 DIAGNOSIS — R293 Abnormal posture: Secondary | ICD-10-CM

## 2017-11-30 DIAGNOSIS — R41840 Attention and concentration deficit: Secondary | ICD-10-CM

## 2017-11-30 DIAGNOSIS — R278 Other lack of coordination: Secondary | ICD-10-CM

## 2017-11-30 NOTE — Therapy (Signed)
[facility] [address] [place], [place], [address] Phone: [phone]   Fax:  [phone]  Physical Therapy Treatment  Patient Details  Name: [name] [name] MRN: [id number] Date of Birth: 1950-12-27 Referring Provider: Dr. Carles Collet   Encounter Date: 11/30/2017  PT End of Session - 11/30/17 1204    Visit Number  2    Number of Visits  17    Date for PT Re-Evaluation  01/29/18    Authorization Type  BCBS [employer]-75 visit limit combined PT, OT, speech    Authorization - Visit Number  2    Authorization - Number of Visits  -- 75 PT, OT, speech combined    PT Start Time  0934    PT Stop Time  1015    PT Time Calculation (min)  41 min    Activity Tolerance  Patient tolerated treatment well    Behavior During Therapy  [facility] for tasks assessed/performed       Past Medical History:  Diagnosis Date  .  OSA (obstructive sleep apnea) 01/17/2011   npsg 2012:  AHI 67/hr. Auto titration 2012:  Optimal pressure 12cm.   . APPENDECTOMY, HX OF 02/18/2008   Qualifier: Diagnosis of  By: [place], [place]    . Cardiac murmur    as a child  . Depression   . Headache(784.0)   . HEADACHES, HX OF 02/18/2008   Qualifier: Diagnosis of  By: Danny Lawless CMA, [place]    . Parkinson disease ([place]) 11/2014  . Streptococcal meningitis    as an infant    Past Surgical History:  Procedure Laterality Date  . APPENDECTOMY  1967  . NASAL SINUS SURGERY     x 4 as a child  . VASECTOMY      There were no vitals filed for this visit.  Subjective Assessment - 11/30/17 0935    Subjective  No falls, no changes since eval    Patient Stated Goals  Pt's goals for therapy are to be doing exercise and to improve balance.    Currently in Pain?  No/denies                      [facility] Adult PT Treatment/Exercise - 11/30/17 0001      Transfers   Transfers  Sit to Stand;Stand to Sit;Floor to Transfer    Sit to Stand  6: Modified independent  (Device/Increase time);Without upper extremity assist;From bed    Stand to Sit  6: Modified independent (Device/Increase time);Without upper extremity assist;To elevated surface    Floor to Transfer Details (indicate cue type and reason)  At pt's request, practiced floor>stand transfer, as he needs to be able to do this at [employer] class and he often needs assistance.  Worked on various aspects of floor>stand transfer, including quadruped forward/back rocking, quadruped>1/2 kneel with cues for deliberate step and foot clearance/widened BOS, rocking in half kneel, with varied hand placement and use of moementum for ease of 1/2 kneel to stand.  Pt tends to prefer to come straight up through 1/2 kneel to stand, does not want to use UE support and tends to take increased time.  Tried to provide cues for ease of technique, including use of yoga block on floor for additional UE support.  Practiced floor>stand 7 reps, varied methods and cueing.    Number of Reps  10 reps;2 sets from 22", then from 20" surface    Transfer Cueing  Cues for technique, including  forward lean, upright posture upon standing          Balance Exercises - 11/30/17 1202      Balance Exercises: Standing   Wall Bumps  Hip    Wall Bumps-Hips  Eyes opened;Anterior/posterior;10 reps 2 sets-tactile and verbal cues, coordinated UEs    Stepping Strategy  Anterior;Posterior;Lateral;UE support;10 reps each direction-cues for deliberate step and return     Heel Raises Limitations  20 reps    Toe Raise Limitations  20 reps 3 second hold    Other Standing Exercises  Worked on balance strategies, as above, with cues throughout for increased deliberate movement, increased intensity of movement for improved postural strategies, balance recovery.  Worked on rocking, lateral with wide BOS, rocking to initiate turns and changes of directions.          PT Short Term Goals - 11/30/17 1209      PT SHORT TERM GOAL #1   Title  Pt will  be independent with HEP to target Parkinson's specific deficits.  TARGET for all STGS 12/22/17    Time  4    Period  Weeks    Status  New      PT SHORT TERM GOAL #2   Title  Pt will improve TUG score to less than or equal to 13.5 seconds for decreased fall risk.    Time  4    Period  Weeks    Status  New      PT SHORT TERM GOAL #3   Title  Pt will perform sit<>stand transfers at least 8 of 10 reps with no hesitation and posterior lean.    Time  4    Period  Weeks    Status  New      PT SHORT TERM GOAL #4   Title  Pt will improve MiniBESTest score to at least 15/28 for decreased fall risk.    Time  4    Period  Weeks    Status  New      PT SHORT TERM GOAL #5   Title  Pt will improve ABC scale score by at least 10% to demonstrate improved balance confidence with daily activities.    Baseline  Eval:  57.5%    Time  4    Period  Weeks    Status  New        PT Long Term Goals - 11/30/17 1210      PT LONG TERM GOAL #1   Title  Pt will verbalize understanding of fall prevention/tips to reduce freezing with gait.  UPDATED TARGET for all LTGs 01/19/18    Time  8    Period  Weeks    Status  New      PT LONG TERM GOAL #2   Title  Pt will improve TUG cognitive to less than or equal to 15 seconds for decreased fall risk/improved dual tasking with gait.    Time  8    Period  Weeks    Status  New      PT LONG TERM GOAL #3   Title  Pt will improve MiniBESTest score to at least 19/28 for decreased fall risk.    Time  8    Period  Weeks    Status  New      PT LONG TERM GOAL #4   Title  Pt will improve gait velocity to at least 2 ft/sec for improved gait efficiency and safety in community    Time  8    Period  Weeks    Status  New      PT LONG TERM GOAL #5   Title  Pt will ambulate at least 1000 ft, indoor and outdoor surfaces, including ramps and inclines, with least restrictive assistive device modified independently for improved safety with outdoor gait.    Time  8     Period  Weeks    Status  New            Plan - 11/30/17 1205    Clinical Impression Statement  Pt returns for first PT visit today following eval several weeks ago.  Addressed sit<>stand transfers (pt reports he feels comfortable with this and works on this with trainer), due to slowed pace and repeated cues.  Also addressed floor to stand transfers with varied cues and techniques to improve ease of transfers, though patient prefers to continue the way he normally does it.  Also began addresssing balance strategies.  Will need additional practice and then initiate HEP.    Rehab Potential  Good    Clinical Impairments Affecting Rehab Potential  Memory deficits per speech eval notes    PT Frequency  2x / week    PT Duration  8 weeks plus eval; recert 0/98/11 to cover POC    PT Treatment/Interventions  ADLs/Self Care Home Management;DME Instruction;Gait training;Stair training;Functional mobility training;Therapeutic activities;Therapeutic exercise;Balance training;Neuromuscular re-education;Patient/family education    PT Next Visit Plan  Continue Balance strategy work, sit<>stand, gait activities for foot clearance; work on floor to stand transfers per patient request    Consulted and Agree with Plan of Care  Patient       Patient will benefit from skilled therapeutic intervention in order to improve the following deficits and impairments:  Abnormal gait, Decreased balance, Decreased mobility, Difficulty walking, Postural dysfunction  Visit Diagnosis: Other abnormalities of gait and mobility  Unsteadiness on feet  Abnormal posture  Other symptoms and signs involving the nervous system     Problem List Patient Active Problem List   Diagnosis Date Noted  . TMJ (temporomandibular joint syndrome) 10/10/2017  . Parkinson's disease ([place]) 03/20/2017  . Bilateral lower extremity edema 03/20/2017  . Chronic venous insufficiency 09/13/2016  . Fatigue 03/15/2016  . Parkinsonian  features 09/15/2015  . ED (erectile dysfunction) 09/15/2015  . MDD (major depressive disorder), recurrent episode, severe ([place]) 02/10/2014  . Nonspecific abnormal electrocardiogram (ECG) (EKG) 02/07/2014  . Non-compliant behavior 02/03/2014  . Morton's neuroma of left foot 08/07/2013  . 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 Hyperglycemia 01/26/2012  . Hypogonadism male 01/24/2012  . Syncope and collapse 01/20/2012  .  OSA (obstructive sleep apnea) 01/17/2011  . CHEST TIGHTNESS 02/18/2008    Alexiah Koroma W. 11/30/2017, 12:11 PM  Frazier Butt., PT  [facility] [address] [place], [place], [address] Phone: [phone]   Fax:  [phone]  Name: [name] [name] MRN: [id number] Date of Birth: January 02, 1951

## 2017-11-30 NOTE — Patient Instructions (Signed)
  Memory Strategies  1. Use "WARM" strategy. W= write it down A=  associate it R=  repeat it M=  make a mental picture  2. You can keep a Social worker. Use a 3-ring notebook with sections for the following:  calendar, important names and phone numbers, medications, doctors' names/phone numbers, "to do list"/reminders, and a section to journal what you did each day  3. Use a calendar to write appointments down.  4. Write yourself a schedule for the day.  This can be placed on the calendar or in a separate section of the Memory Notebook.  Keeping a regular schedule can help memory.  5. Use medication organizer with sections for each day or morning/evening pills  You may need help loading it  6. Keep a basket, or pegboard by the door.   Place items that you need to take out with you in the basket or on the pegboard.  You may also want to include a message board for reminders.  7. Use sticky notes. Place sticky notes with reminders in a place where the task is performed.  For example:  "turn off the stove" placed by the stove, "lock the door" placed on the door at eye level, "take your medications" on the bathroom mirror or by the place where you normally take your medications  8. Use alarms/timers.  Use while cooking to remind yourself to check on food or as a reminder to take your medicine, or as a reminder to make a call, or as a reminder to perform another task, etc.  9. Use a small tape recorder to record important information and notes for yourself.    Play the memory game  Try to remember 3-5 items on your store list without looking  Study a detailed picture in a magazine for 1 minute, then write down everything you can remember from the picture  If you're going into another room, try repeating what you're going to do or name the room you're going to several times before you do it.

## 2017-11-30 NOTE — Therapy (Signed)
[facility] [address] [place], [place], [address] Phone: [phone]   Fax:  [phone]  Occupational Therapy Treatment  Patient Details  Name: [name] [name] MRN: [id number] Date of Birth: 1951/02/25 Referring Provider: Dr. Carles Collet   Encounter Date: 11/30/2017  OT End of Session - 11/30/17 0830    Visit Number  2    Number of Visits  17    Date for OT Re-Evaluation  11/09/17    Authorization Type  [employer]    Authorization Time Period  75 visit limit combined     Authorization - Visit Number  2    Authorization - Number of Visits  25    OT Start Time  0807    OT Stop Time  0848    OT Time Calculation (min)  41 min    Activity Tolerance  Patient tolerated treatment well    Behavior During Therapy  [facility] for tasks assessed/performed;Flat affect       Past Medical History:  Diagnosis Date  .  OSA (obstructive sleep apnea) 01/17/2011   npsg 2012:  AHI 67/hr. Auto titration 2012:  Optimal pressure 12cm.   . APPENDECTOMY, HX OF 02/18/2008   Qualifier: Diagnosis of  By: [place], [place]    . Cardiac murmur    as a child  . Depression   . Headache(784.0)   . HEADACHES, HX OF 02/18/2008   Qualifier: Diagnosis of  By: Danny Lawless CMA, [place]    . Parkinson disease ([place]) 11/2014  . Streptococcal meningitis    as an infant    Past Surgical History:  Procedure Laterality Date  . APPENDECTOMY  1967  . NASAL SINUS SURGERY     x 4 as a child  . VASECTOMY      There were no vitals filed for this visit.  Subjective Assessment - 11/30/17 0809    Subjective   My fear of falling has increased     Pertinent History  Parkinson's disease, seeing illusions, hx of falls, hx of significant depression, mild cognitive impairment, sleep apnea, hypogonadism, ADD    Limitations  orthostatic BP!!! monitor PRN    Patient Stated Goals  improved balance and ease with ADLs    Currently in Pain?  No/denies         PWR! Moves (basic  4) in sitting x 20 each with min-mod cues For incr movement amplitude and effort, focus on wt. Shift with PWR! step.  Arm bike x93min level 1 for reciprocal movement with cues/target of at least 50rpms for intensity while maintaining movement amplitude/reciprocal movement.   Pt maintained 42-52rpms without rest.  PWR! Hands with focus on big effort (giving therapist "high 5's" for incr feedback) with min-mod cueing.  Reviewed previously issued dressing stretching--see pt instructions.  Emphasized importance of exercise 7 days a week and therapy HEP to address functional changes and that exercises is medicine.  Pt verbalized understanding.                   OT Education - 11/30/17 [phone]    Education Details  Sitting PWR! Moves (basic 4); Stretching for Dressing; PWR! hands    Person(s) Educated  Patient    Methods  Explanation;Demonstration;Tactile cues;Verbal cues;Handout    Comprehension  Verbalized understanding;Returned demonstration;Verbal cues required min-mod cueing   min-mod cueing      OT Short Term Goals - 11/09/17 1248      OT SHORT TERM GOAL #1   Title  Pt  will be independent with PD specific  HEP.--    Time  4    Period  Weeks    Status  New    Target Date  12/09/17      OT SHORT TERM GOAL #2   Title  Pt will verbalize understanding of ways to prevent future complications and current available/appropriate community resources prn.    Time  4    Status  New      OT SHORT TERM GOAL #3   Title  Pt will improve bilateral hand coordination as shown by fastening/unfastening 3 buttons in 50sec or less.    Baseline  57.34    Time  4    Period  Weeks    Status  New      OT SHORT TERM GOAL #4   Title  Pt will report incr ease with donning pants socks/shoes using adaptive strategies/AE prn.    Time  4    Status  New        OT Long Term Goals - 11/09/17 1249      OT LONG TERM GOAL #1   Title  Pt will verbalize understanding of adapted strategies for  ADLs/IADLs--    Time  8    Period  Weeks    Status  New    Target Date  01/08/18      OT LONG TERM GOAL #2   Title  Pt will demonstrate improved functional standing balance as evidenced by increasing bilateral  standing functional reach to 10 inches or greater    Baseline  RUE 9.5 inches, LUE 8.5 in    Time  8    Period  Weeks    Status  New      OT LONG TERM GOAL #3   Title  Pt will improve bilateral hand coordination as shown by fastening/unfastening 3 buttons in 45sec or less.    Time  8    Period  Weeks    Status  New            Plan - 11/30/17 0831    Clinical Impression Statement  Pt responded well to exercises/activities focused on large amplitude movements and demo decr rigidity with walking at end of session.  Pt also reports that he can tell exercises helped.  Pt would benefit from reinforcement and continued cueing for high intensity/effort.    Occupational Profile and client history currently impacting functional performance  PMH that includes: seeing illusions, hx of falls, hx of significant depression, mild cognitive impairment, sleep apnea, hypogonadism, ADD. Pt retired due to difficulty at work and no longer drives. Pt reports slowing of ADLs and incr cognitive deficits impacting function.     Occupational performance deficits (Please refer to evaluation for details):  ADL's;IADL's;Leisure;Social Participation    Rehab Potential  Good    Current Impairments/barriers affecting progress:  cognitive deficits, depression    OT Frequency  2x / week plus eval or 17 visits total x 12 weeks    OT Duration  8 weeks    OT Treatment/Interventions  Self-care/ADL training;Moist Heat;Fluidtherapy;Balance training;Therapeutic activities;Cognitive remediation/compensation;Therapeutic exercise;Ultrasound;Cryotherapy;Neuromuscular education;Visual/perceptual remediation/compensation;Passive range of motion;Functional Mobility Training;Patient/family education;Manual Therapy;Energy  conservation;Paraffin    Plan  continue with large amplitude movement strategies and updates to HEP    OT Home Exercise Plan  Education provided:  stretches prior to dressing, sitting PWR! moves     Consulted and Agree with Plan of Care  Patient       Patient will benefit  from skilled therapeutic intervention in order to improve the following deficits and impairments:  Decreased cognition, Impaired flexibility, Decreased mobility, Decreased coordination, Decreased endurance, Decreased range of motion, Decreased strength, Impaired UE functional use, Impaired tone, Impaired perceived functional ability, Decreased safety awareness, Difficulty walking, Decreased balance, Decreased activity tolerance, Impaired vision/preception, Improper spinal/pelvic alignment  Visit Diagnosis: Other symptoms and signs involving the nervous system  Other symptoms and signs involving the musculoskeletal system  Abnormal posture  Unsteadiness on feet  Other abnormalities of gait and mobility  Other lack of coordination  Attention and concentration deficit    Problem List Patient Active Problem List   Diagnosis Date Noted  . TMJ (temporomandibular joint syndrome) 10/10/2017  . Parkinson's disease ([place]) 03/20/2017  . Bilateral lower extremity edema 03/20/2017  . Chronic venous insufficiency 09/13/2016  . Fatigue 03/15/2016  . Parkinsonian features 09/15/2015  . ED (erectile dysfunction) 09/15/2015  . MDD (major depressive disorder), recurrent episode, severe ([place]) 02/10/2014  . Nonspecific abnormal electrocardiogram (ECG) (EKG) 02/07/2014  . Non-compliant behavior 02/03/2014  . Morton's neuroma of left foot 08/07/2013  . 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 Hyperglycemia 01/26/2012  . Hypogonadism male 01/24/2012  . Syncope and collapse 01/20/2012  .  OSA (obstructive sleep apnea) 01/17/2011  . CHEST TIGHTNESS 02/18/2008    [facility] 11/30/2017, 9:03  AM  [facility] [address] [place], [place], [address] Phone: [phone]   Fax:  [phone]  Name: NORMAND DAMRON MRN: [id number] Date of Birth: May 04, 1951   Vianne Bulls, OTR/L [facility] [address] [place], [place]  [address] [phone] phone [phone] 11/30/17 9:04 AM

## 2017-11-30 NOTE — Therapy (Signed)
[facility] [address] [place], [place], [address] Phone: [phone]   Fax:  [phone]  Speech Language Pathology Treatment  Patient Details  Name: [name] [name] MRN: [id number] Date of Birth: [birth year] Referring Provider: Alonza Bogus, DO   Encounter Date: 11/30/2017  End of Session - 11/30/17 1225    Visit Number  2    Number of Visits  17    Date for SLP Re-Evaluation  01/19/18    SLP Start Time  0848    SLP Stop Time   0929    SLP Time Calculation (min)  41 min    Activity Tolerance  Patient tolerated treatment well       Past Medical History:  Diagnosis Date  .  OSA (obstructive sleep apnea) 01/17/2011   npsg 2012:  AHI 67/hr. Auto titration 2012:  Optimal pressure 12cm.   . APPENDECTOMY, HX OF 02/18/2008   Qualifier: Diagnosis of  By: [place], [place]    . Cardiac murmur    as a child  . Depression   . Headache(784.0)   . HEADACHES, HX OF 02/18/2008   Qualifier: Diagnosis of  By: Danny Lawless CMA, [place]    . Parkinson disease ([place]) 11/2014  . Streptococcal meningitis    as an infant    Past Surgical History:  Procedure Laterality Date  . APPENDECTOMY  1967  . NASAL SINUS SURGERY     x 4 as a child  . VASECTOMY      There were no vitals filed for this visit.  Subjective Assessment - 11/30/17 0851    Subjective  "I'm slow and deliberate."    Currently in Pain?  No/denies            ADULT SLP TREATMENT - 11/30/17 0848      General Information   Behavior/Cognition  Alert;Cooperative;Pleasant mood    Patient Positioning  Upright in chair      Treatment Provided   Treatment provided  Cognitive-Linquistic      Pain Assessment   Pain Assessment  No/denies pain      Cognitive-Linquistic Treatment   Treatment focused on  Cognition    Skilled Treatment  SLP educated pt re: proposed therapy goals and course of ST. Introduced [employer] memory strategies. Pt told SLP he frequently gets  up to go to another room and forgets the reason by the time he gets there or sometimes forgets where he is going. SLP encouraged pt to use repetition of where/why. In simple working memory task (3 word mental manipulation), pt immediate recall of 3 words was 40%. Pt required usual cues from SLP to use a strategy (repetition or association) to aid recall. With strategies, immediate recall and mental manipulation was 90% accurate. Pt told SLP he has 2 "scripts" memorized in case he needs to fall back on them in conversation. SLP educated re: active listening strategies (rephrasing, asking questions) to improve attention during conversations.       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, retail appropriate for recall of 60% of details    Time  4    Period  Weeks    Status  On-going      SLP SHORT TERM GOAL #2   Title  pt will demo attention appropriate for 5 minutes simple conversation with modified independence (compensations)    Time  4    Period  Weeks    Status  On-going       SLP Long Term Goals - 11/30/17 1228      SLP LONG TERM GOAL #1   Title  pt will demo attention skills adequate for functional verbal expression in 10 minutes simple-mod complex conversation over two sessions    Time  8    Period  Weeks    Status  On-going      SLP LONG TERM GOAL #2   Title  pt will complete functional working memory tasks with rare min A and compensatory measures    Time  8     Period  Weeks    Status  On-going       Plan - 11/30/17 1226    Clinical Impression Statement  Pt presents today with what appears to be deficits in attention hindering his abilities in converastion as pt reports (and he demonstrated today) that he loses train of thought mid-sentence. Pt would benefit from skilled ST addressing attention and attention compensations.    Speech Therapy Frequency  2x / week    Treatment/Interventions  Language facilitation;Internal/external aids;Compensatory techniques;SLP instruction and feedback;Multimodal communcation approach;Cognitive reorganization;Functional tasks;Cueing hierarchy;Patient/family education    Potential to Achieve Goals  Good    Potential Considerations  Severity of impairments    Consulted and Agree with Plan of Care  Patient       Patient will benefit from skilled therapeutic intervention in order to improve the following deficits and impairments:   Cognitive communication deficit    Problem List Patient Active Problem List   Diagnosis Date Noted  . TMJ (temporomandibular joint syndrome) 10/10/2017  . Parkinson's disease ([place]) 03/20/2017  . Bilateral lower extremity edema 03/20/2017  . Chronic venous insufficiency 09/13/2016  . Fatigue 03/15/2016  . Parkinsonian features 09/15/2015  . ED (erectile dysfunction) 09/15/2015  . MDD (major depressive disorder), recurrent episode, severe ([place]) 02/10/2014  . Nonspecific abnormal electrocardiogram (ECG) (EKG) 02/07/2014  . Non-compliant behavior 02/03/2014  . Morton's neuroma of left foot 08/07/2013  . 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 Hyperglycemia 01/26/2012  . Hypogonadism male 01/24/2012  . Syncope and collapse 01/20/2012  .  OSA (obstructive sleep apnea) 01/17/2011  . CHEST TIGHTNESS 02/18/2008   Deneise Lever, [place], CCC-SLP Speech-Language Pathologist  Aliene Altes 11/30/2017, 12:29 PM  [facility] [address] [place], [place], [address] Phone: [phone]   Fax:  [phone]   Name: [name] [name] MRN: [id number] Date of Birth: June 15, 1951

## 2017-11-30 NOTE — Patient Instructions (Signed)
Stretches before dressing-  Before lower body dressing: While seated, Reach for the floor 3-5 x, do not hold your breath Seated, reach for each ankle 3x Cross one leg across your other knee hold 5 secs, perform 3x for each leg  Holding bag in both hands raise arms behind head as if putting on a shirt 10 reps  Holding a balled up plastic bag pass the bag behind your back between your hands 10x       PWR! Hands: Start with elbows bent and hands closed, Push hands out BIG. Elbows straight, wrists up, fingers open and spread apart BIG.  High 5's.  x20

## 2017-12-05 ENCOUNTER — Ambulatory Visit: Payer: Medicare Other | Admitting: Occupational Therapy

## 2017-12-05 ENCOUNTER — Encounter: Payer: Self-pay | Admitting: Physical Therapy

## 2017-12-05 ENCOUNTER — Ambulatory Visit: Payer: Medicare Other | Attending: Neurology | Admitting: Physical Therapy

## 2017-12-05 ENCOUNTER — Encounter: Payer: Self-pay | Admitting: Speech Pathology

## 2017-12-05 ENCOUNTER — Ambulatory Visit: Payer: Medicare Other | Admitting: Speech Pathology

## 2017-12-05 DIAGNOSIS — R278 Other lack of coordination: Secondary | ICD-10-CM | POA: Diagnosis not present

## 2017-12-05 DIAGNOSIS — R471 Dysarthria and anarthria: Secondary | ICD-10-CM | POA: Diagnosis not present

## 2017-12-05 DIAGNOSIS — R29898 Other symptoms and signs involving the musculoskeletal system: Secondary | ICD-10-CM | POA: Insufficient documentation

## 2017-12-05 DIAGNOSIS — R41840 Attention and concentration deficit: Secondary | ICD-10-CM | POA: Insufficient documentation

## 2017-12-05 DIAGNOSIS — R29818 Other symptoms and signs involving the nervous system: Secondary | ICD-10-CM

## 2017-12-05 DIAGNOSIS — R293 Abnormal posture: Secondary | ICD-10-CM | POA: Insufficient documentation

## 2017-12-05 DIAGNOSIS — R2689 Other abnormalities of gait and mobility: Secondary | ICD-10-CM | POA: Diagnosis not present

## 2017-12-05 DIAGNOSIS — R41841 Cognitive communication deficit: Secondary | ICD-10-CM | POA: Diagnosis not present

## 2017-12-05 DIAGNOSIS — R4189 Other symptoms and signs involving cognitive functions and awareness: Secondary | ICD-10-CM | POA: Diagnosis not present

## 2017-12-05 DIAGNOSIS — R4701 Aphasia: Secondary | ICD-10-CM | POA: Diagnosis not present

## 2017-12-05 DIAGNOSIS — R2681 Unsteadiness on feet: Secondary | ICD-10-CM | POA: Insufficient documentation

## 2017-12-05 NOTE — Patient Instructions (Addendum)
   Cognitive Activities you can do at home:   - [place] (easy level)  - [place]  On your computer, tablet or phone: Sport and exercise psychologist  Consider keeping a mini note book in your pocket to write down lists, conversations, messages, activities, chores    Get the persons attention before you speak  Use eye contact and face the person you are speaking to  Be in close proximity to the person you are speaking to  Turn down any noise in the environment such as the TV, walk away from loud appliances, air conditioners, fans, dish washers etc  Allow extra time for processing conversation and instructions - slow processing  Consider digital watch with date and day in words on it - [employer]    Tips to help facilitate better attention, concentration, focus   Do harder, longer tasks when you are most alert/awake  Break down larger tasks into small parts  Limit distractions of TV, radio, conversation, e mails/texts, appliance noise, etc - if a job is important, do it in a quiet room  Be aware of how you are functioning in high stimulation environments such as large stores, parties, restaurants - any place with lots of lights, noise, signs etc  Group conversations may be more difficult to process than one on one conversations  Give yourself extra time to process conversation, reading materials, directions or information from your healthcare providers  Organization is key - clutters of laundry, mail, paperwork, dirty dishes - all make it more difficult to concentrate  Before you start a task, have all the needed supplies, directions, recipes ready and organized. This way you don't have to go looking for something in the middle of a task and become distracted.   Be aware of fatigue - take rests or breaks when  needed to re-group and re-focus  Do Loud AH!! And HEY HEY HEY 5x each twice a day  Read 10 sentences - Think Shout!!  When Nieko needs to be louder - Encourage him to "Think Shout"

## 2017-12-05 NOTE — Therapy (Signed)
[facility] [address] [place], [place], [address] Phone: [phone]   Fax:  [phone]  Speech Language Pathology Treatment  Patient Details  Name: [name] [name] MRN: [id number] Date of Birth: [birth year] Referring Provider: Alonza Bogus, DO   Encounter Date: 12/05/2017  End of Session - 12/05/17 1210    Visit Number  3    Number of Visits  17    Date for SLP Re-Evaluation  01/19/18    SLP Start Time  0846    SLP Stop Time   0930    SLP Time Calculation (min)  44 min    Activity Tolerance  Patient tolerated treatment well       Past Medical History:  Diagnosis Date  .  OSA (obstructive sleep apnea) 01/17/2011   npsg 2012:  AHI 67/hr. Auto titration 2012:  Optimal pressure 12cm.   . APPENDECTOMY, HX OF 02/18/2008   Qualifier: Diagnosis of  By: [place], [place]    . Cardiac murmur    as a child  . Depression   . Headache(784.0)   . HEADACHES, HX OF 02/18/2008   Qualifier: Diagnosis of  By: Danny Lawless CMA, [place]    . Parkinson disease ([place]) 11/2014  . Streptococcal meningitis    as an infant    Past Surgical History:  Procedure Laterality Date  . APPENDECTOMY  1967  . NASAL SINUS SURGERY     x 4 as a child  . VASECTOMY      There were no vitals filed for this visit.  Subjective Assessment - 12/05/17 1148    Patient is accompained by:  Family member spouse, [name]            ADULT SLP TREATMENT - 12/05/17 1147      General Information   Behavior/Cognition  Alert;Cooperative;Pleasant mood      Treatment Provided   Treatment provided  Cognitive-Linquistic      Pain Assessment   Pain Assessment  No/denies pain      Cognitive-Linquistic Treatment   Treatment focused on  Cognition;Dysarthria    Skilled Treatment  Spouse present and reporting that she consistently has to have Rowdy repeat himself because she can't understand him due to volume and mumbling. Educated her re:  environmental modifications to improve her comprehension of his speech, as well as Byrne's slow processing and cognitive impairments also affect his ability to use compensations to improve intellgibility. As poor intelligiblity is a concernn for spouse, will add goals for dysarthria compensations. Pt did complete loud /a/ to recalibrate volume with average of 90dB with mod I. There was improvement in volume after that. Initiating training spouse for appropriate cueing for volume and intelligibility, using "Get loud" or "think shout" rather than a general request for repetition. Trained pt in compensations for dysarthria (SLOP) with min A. Pt verbalized compensations for  memory  & attention with visual cues. Pt is to bring in his therapy notebook for assistance organizing.       Assessment / Recommendations / Plan   Plan  Continue with current plan of care      Progression Toward Goals   Progression toward goals  Progressing toward goals       SLP Education - 12/05/17 1158    Education provided  Yes    Education Details  memory and attention impairments, how slow processing affects Wilburn's ability to carryover dysarthria compensations without verbal cues, environmental modifcations to improve spouse's comprehension of Khyan's speech  Person(s) Educated  Patient;Spouse    Methods  Explanation;Demonstration;Verbal cues;Handout    Comprehension  Verbalized understanding;Verbal cues required;Need further instruction       SLP Short Term Goals - 12/05/17 1208      SLP SHORT TERM GOAL #1   Title  pt will demo working [employer]er, [employer] appropriate for recall of 60% of details    Time  3    Period  Weeks    Status  On-going      SLP SHORT TERM GOAL #2   Title  pt will demo attention appropriate for 5 minutes simple conversation with modified independence (compensations)    Time  3    Period  Weeks    Status  On-going      SLP SHORT TERM GOAL #3   Title  Pt will utilize compensations for  dysarthria in structured tasks with occasional min A     Time  3    Period  Weeks    Status  New       SLP Long Term Goals - 12/05/17 1209      SLP LONG TERM GOAL #1   Title  pt will demo attention skills adequate for functional verbal expression in 10 minutes simple-mod complex conversation over two sessions    Time  7    Period  Weeks    Status  On-going      SLP LONG TERM GOAL #2   Title  pt will complete functional working memory tasks with rare min A and compensatory measures    Time  7    Period  Weeks    Status  On-going      SLP LONG TERM GOAL #3   Title  Pt will utilize compensations to be intelligible with cues from spouse at sentence level over 2 sessions per pt/spouse report    Time  3    Period  Weeks    Status  New       Plan - 12/05/17 1200    Clinical Impression Statement  Spouse present today and requesting ST address pts intellgilbity as this is affecting daily communication. Extensive education re: cognitive impairments and how this impacts Kanon's ability to carryover comepnsations for dysarthria. I added goals for dysarthria. Pt conitinues to require extended time in simple conversations and basic questions due to cognitive impairments. Continue skilled ST to maximize carryover of compensations for cognition and intelligibility.    Speech Therapy Frequency  2x / week    Treatment/Interventions  Language facilitation;Internal/external aids;Compensatory techniques;SLP instruction and feedback;Multimodal communcation approach;Cognitive reorganization;Functional tasks;Cueing hierarchy;Patient/family education    Potential to Achieve Goals  Good    Potential Considerations  Severity of impairments    Consulted and Agree with Plan of Care  Patient       Patient will benefit from skilled therapeutic intervention in order to improve the following deficits and impairments:   Cognitive deficits  Dysarthria and anarthria    Problem List Patient Active  Problem List   Diagnosis Date Noted  . TMJ (temporomandibular joint syndrome) 10/10/2017  . Parkinson's disease ([place]) 03/20/2017  . Bilateral lower extremity edema 03/20/2017  . Chronic venous insufficiency 09/13/2016  . Fatigue 03/15/2016  . Parkinsonian features 09/15/2015  . ED (erectile dysfunction) 09/15/2015  . MDD (major depressive disorder), recurrent episode, severe ([place]) 02/10/2014  . Nonspecific abnormal electrocardiogram (ECG) (EKG) 02/07/2014  . Non-compliant behavior 02/03/2014  . Morton's neuroma of left foot 08/07/2013  . Attention deficit disorder without mention of  hyperactivity 03/13/2012  . Hyperglycemia 01/26/2012  . Hypogonadism male 01/24/2012  . Syncope and collapse 01/20/2012  .  OSA (obstructive sleep apnea) 01/17/2011  . CHEST TIGHTNESS 02/18/2008    Jeanette Moffatt, Annye Rusk MS, CCC-SLP 12/05/2017, 12:11 PM  [facility] [address] [place], [place], [address] Phone: [phone]   Fax:  [phone]   Name: AARISH ROCKERS MRN: [id number] Date of Birth: 08-04-1951

## 2017-12-05 NOTE — Patient Instructions (Signed)
Coordination Exercises  Perform the following exercises  1 times per day. Perform with both hand(s). Perform using big movements.   Flipping Cards: Place deck of cards on the table. Flip cards over by opening your hand big to grasp and then turn your palm up big, opening hand fully to release.  Deal cards: Hold 1/2 or whole deck in your hand. Use thumb to push card off top of deck with one big push.  Rotate ball with fingertips: Pick up with fingers/thumb and move as much as you can with each turn/movement (clockwise and counter-clockwise).  Pick up coins and stack one at a time: Pick up with big, intentional movements. Do not drag coin to the edge. (5-10 in a stack)  Pick up 5-10 coins one at a time and hold in palm. Then, move coins from palm to fingertips one at time and place in coin bank/container.

## 2017-12-05 NOTE — Therapy (Signed)
[facility] [address] [place], [place], [address] Phone: [phone]   Fax:  [phone]  Occupational Therapy Treatment  Patient Details  Name: [name] [name] MRN: [id number] Date of Birth: 1950/11/09 Referring Provider: Dr. Carles Collet   Encounter Date: 12/05/2017  OT End of Session - 12/05/17 0859    Visit Number  3    Number of Visits  17    Date for OT Re-Evaluation  11/09/17    Authorization Type  BCBS    Authorization Time Period  75 visit limit combined     Authorization - Visit Number  3    Authorization - Number of Visits  25    OT Start Time  0809    OT Stop Time  0848    OT Time Calculation (min)  39 min    Activity Tolerance  Patient tolerated treatment well    Behavior During Therapy  [facility] for tasks assessed/performed;Flat affect       Past Medical History:  Diagnosis Date  .  OSA (obstructive sleep apnea) 01/17/2011   npsg 2012:  AHI 67/hr. Auto titration 2012:  Optimal pressure 12cm.   . APPENDECTOMY, HX OF 02/18/2008   Qualifier: Diagnosis of  By: [place], [place]    . Cardiac murmur    as a child  . Depression   . Headache(784.0)   . HEADACHES, HX OF 02/18/2008   Qualifier: Diagnosis of  By: Danny Lawless CMA, [place]    . Parkinson disease ([place]) 11/2014  . Streptococcal meningitis    as an infant    Past Surgical History:  Procedure Laterality Date  . APPENDECTOMY  1967  . NASAL SINUS SURGERY     x 4 as a child  . VASECTOMY      There were no vitals filed for this visit.  Subjective Assessment - 12/05/17 0858    Subjective   Pt reports feeling tired    Patient is accompained by:  Family member wife    Pertinent History  Parkinson's disease, seeing illusions, hx of falls, hx of significant depression, mild cognitive impairment, sleep apnea, hypogonadism, ADD    Limitations  orthostatic BP!!! monitor PRN    Patient Stated Goals  improved balance and ease with ADLs    Currently in Pain?   No/denies       Arm bike x6 min level 1 for reciprocal movement with cues/target of at least 40rpms for intensity while maintaining movement amplitude/reciprocal movement.   Pt maintained 38-41rpms. (prior to start of session for warm-up).  PWR! Moves (basic 4) in sitting x 20 each with min-mod cues For incr movement amplitude and incr wt. shift.  Reviewed recommendation for pt to perform these in the morning prior to dressing/"getting ready"  Sit>stand with min-mod cues for large amplitude movement technique using chair as visual target to reach over and PWR! Up to stand.  PWR! Hands with giving therapist "high 5" for sensory input, then with min cueing for incr power and wrist ext.  Wife reports that pt is resistant to doing exercises/things at home.  Discussed briefly how to perform large amplitude movements with functional activities and decr initiation that can be associated with PD--recommended pt/wife also discuss with neurologist.                     OT Education - 12/05/17 2426    Education Details  Reviewed PWR! seated HEP, LB dressing stretches, PWR! hands (issued last session); Coordination  HEP--see pt instructions    Person(s) Educated  Patient;Spouse    Methods  Explanation;Demonstration;Verbal cues;Handout    Comprehension  Verbalized understanding;Returned demonstration;Verbal cues required min-mod cueing for incr movement amplitude/effort   min-mod cueing for incr movement amplitude/effort      OT Short Term Goals - 11/09/17 1248      OT SHORT TERM GOAL #1   Title  Pt will be independent with PD specific  HEP.--    Time  4    Period  Weeks    Status  New    Target Date  12/09/17      OT SHORT TERM GOAL #2   Title  Pt will verbalize understanding of ways to prevent future complications and current available/appropriate community resources prn.    Time  4    Status  New      OT SHORT TERM GOAL #3   Title  Pt will improve bilateral hand  coordination as shown by fastening/unfastening 3 buttons in 50sec or less.    Baseline  57.34    Time  4    Period  Weeks    Status  New      OT SHORT TERM GOAL #4   Title  Pt will report incr ease with donning pants socks/shoes using adaptive strategies/AE prn.    Time  4    Status  New        OT Long Term Goals - 11/09/17 1249      OT LONG TERM GOAL #1   Title  Pt will verbalize understanding of adapted strategies for ADLs/IADLs--    Time  8    Period  Weeks    Status  New    Target Date  01/08/18      OT LONG TERM GOAL #2   Title  Pt will demonstrate improved functional standing balance as evidenced by increasing bilateral  standing functional reach to 10 inches or greater    Baseline  RUE 9.5 inches, LUE 8.5 in    Time  8    Period  Weeks    Status  New      OT LONG TERM GOAL #3   Title  Pt will improve bilateral hand coordination as shown by fastening/unfastening 3 buttons in 45sec or less.    Time  8    Period  Weeks    Status  New            Plan - 12/05/17 0900    Clinical Impression Statement  Pt responded well to exercises/activities focused on large amplitude movements and demo decr rigidity with walking at end of session.  Wife was not aware that pt should be doing HEP at home--reviewed importance.  Wife reports that it is hard to get pt to do things at home.    Occupational Profile and client history currently impacting functional performance  PMH that includes: seeing illusions, hx of falls, hx of significant depression, mild cognitive impairment, sleep apnea, hypogonadism, ADD. Pt retired due to difficulty at work and no longer drives. Pt reports slowing of ADLs and incr cognitive deficits impacting function.     Occupational performance deficits (Please refer to evaluation for details):  ADL's;IADL's;Leisure;Social Participation    Rehab Potential  Good    Current Impairments/barriers affecting progress:  cognitive deficits, depression    OT  Frequency  2x / week plus eval or 17 visits total x 12 weeks    OT Duration  8 weeks  OT Treatment/Interventions  Self-care/ADL training;Moist Heat;Fluidtherapy;Balance training;Therapeutic activities;Cognitive remediation/compensation;Therapeutic exercise;Ultrasound;Cryotherapy;Neuromuscular education;Visual/perceptual remediation/compensation;Passive range of motion;Functional Mobility Training;Patient/family education;Manual Therapy;Energy conservation;Paraffin    Plan  continue with large amplitude movement strategies and updates to HEP; requested pt bring HEP folder and previous ex flowsheet for updates next session    OT Home Exercise Plan  Education provided:  stretches prior to dressing, sitting PWR! moves     Consulted and Agree with Plan of Care  Patient;Family member/caregiver    Family Member Consulted  wife       Patient will benefit from skilled therapeutic intervention in order to improve the following deficits and impairments:  Decreased cognition, Impaired flexibility, Decreased mobility, Decreased coordination, Decreased endurance, Decreased range of motion, Decreased strength, Impaired UE functional use, Impaired tone, Impaired perceived functional ability, Decreased safety awareness, Difficulty walking, Decreased balance, Decreased activity tolerance, Impaired vision/preception, Improper spinal/pelvic alignment  Visit Diagnosis: Other symptoms and signs involving the nervous system  Other symptoms and signs involving the musculoskeletal system  Abnormal posture  Unsteadiness on feet  Other abnormalities of gait and mobility  Other lack of coordination  Attention and concentration deficit    Problem List Patient Active Problem List   Diagnosis Date Noted  . TMJ (temporomandibular joint syndrome) 10/10/2017  . Parkinson's disease ([place]) 03/20/2017  . Bilateral lower extremity edema 03/20/2017  . Chronic venous insufficiency 09/13/2016  . Fatigue 03/15/2016  .  Parkinsonian features 09/15/2015  . ED (erectile dysfunction) 09/15/2015  . MDD (major depressive disorder), recurrent episode, severe ([place]) 02/10/2014  . Nonspecific abnormal electrocardiogram (ECG) (EKG) 02/07/2014  . Non-compliant behavior 02/03/2014  . Morton's neuroma of left foot 08/07/2013  . 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 Hyperglycemia 01/26/2012  . Hypogonadism male 01/24/2012  . Syncope and collapse 01/20/2012  .  OSA (obstructive sleep apnea) 01/17/2011  . CHEST TIGHTNESS 02/18/2008    [facility] 12/05/2017, 9:02 AM  [facility] [address] [place], [place], [address] Phone: [phone]   Fax:  [phone]  Name: [name] [name] MRN: [id number] Date of Birth: June 22, 1951   Vianne Bulls, OTR/L [facility] [address] [place], [place]  [address] [phone] phone [phone] 12/05/17 9:34 AM

## 2017-12-05 NOTE — Therapy (Signed)
[facility] [address] [place], [place], [address] Phone: [phone]   Fax:  [phone]  Physical Therapy Treatment  Patient Details  Name: [name] [name] MRN: [id number] Date of Birth: 1951/02/25 Referring Provider: Dr. Carles Collet   Encounter Date: 12/05/2017  PT End of Session - 12/05/17 1425    Visit Number  3    Number of Visits  17    Date for PT Re-Evaluation  01/29/18    Authorization Type  [employer]BS [employer]-75 visit limit combined PT, OT, speech    Authorization - Visit Number  3    Authorization - Number of Visits  -- 75 PT, OT, speech combined    PT Start Time  4010 late from SLP    PT Stop Time  1016    PT Time Calculation (min)  42 min    Activity Tolerance  Patient tolerated treatment well    Behavior During Therapy  [facility] for tasks assessed/performed       Past Medical History:  Diagnosis Date  .  OSA (obstructive sleep apnea) 01/17/2011   npsg 2012:  AHI 67/hr. Auto titration 2012:  Optimal pressure 12cm.   . APPENDECTOMY, HX OF 02/18/2008   Qualifier: Diagnosis of  By: [place], [place]    . Cardiac murmur    as a child  . Depression   . Headache(784.0)   . HEADACHES, HX OF 02/18/2008   Qualifier: Diagnosis of  By: Danny Lawless CMA, [place]    . Parkinson disease ([place]) 11/2014  . Streptococcal meningitis    as an infant    Past Surgical History:  Procedure Laterality Date  . APPENDECTOMY  1967  . NASAL SINUS SURGERY     x 4 as a child  . VASECTOMY      There were no vitals filed for this visit.  Subjective Assessment - 12/05/17 1402    Subjective  No falls, no changes since eval. "I don't want to work on getting up from the floor, but I need to"    Patient is accompained by:  Family member wife, Maggie    Patient Stated Goals  Pt's goals for therapy are to be doing exercise and to improve balance.    Currently in Pain?  No/denies                      [facility] Adult PT  Treatment/Exercise - 12/05/17 0941      Transfers   Sit to Stand  5: Supervision    Sit to Stand Details (indicate cue type and reason)  x 10 (pt counted with loud voice) vc for PWR! Up position as fully stands; vc for slow controlled descent    Floor to Transfer  4: Min guard;5: Supervision    Floor to Transfer Details (indicate cue type and reason)  x 3 reps; pt would only go to quadruped for starting position x 1 (and was able to bring foot forward to 1/2 kneeling; from 1/2 kneeling required incr time to initiate, however did not require physical assist on only used light LUE assist on 2 of 3 trials (light enough he could have used a light chair for his support, which he could use at boxing class)      Ambulation/Gait   Ambulation/Gait Assistance  6: Modified independent (Device/Increase time)    Ambulation Distance (Feet)  400 Feet 200    Assistive device  None    Gait Pattern  Step-through pattern;Decreased arm  swing - right;Decreased arm swing - left;Decreased step length - right;Decreased step length - left;Decreased trunk rotation;Trunk flexed;Poor foot clearance - left    Ambulation Surface  Indoor    Stairs  Yes    Stairs Assistance  5: Supervision;6: Modified independent (Device/Increase time)    Stairs Assistance Details (indicate cue type and reason)  for safety initially; demonstrated safe technique    Stair Management Technique  Two rails;Alternating pattern;Step to pattern;Sideways;Forwards;One rail Right forwards up; sideways/feet turned down    Number of Stairs  12    Height of Stairs  6    Pre-Gait Activities  stepping over yardstick on the floor (fwd initially, then backward) with progression to golf putter handle for improving foot clearance and step length;     Gait Comments  pt reports concern over his ability to stand on one foot long enough to safely ascend/descend steps. He progressed from 2 rails on steps to modified independent with 1 rail           Balance  Exercises - 12/05/17 1415      Balance Exercises: Standing   SLS  Eyes open;Upper extremity support 2;3 reps;10 secs;30 secs 1-2 finger support on both hands; prep for stair training           PT Short Term Goals - 11/30/17 1209      PT SHORT TERM GOAL #1   Title  Pt will be independent with HEP to target Parkinson's specific deficits.  TARGET for all STGS 12/22/17    Time  4    Period  Weeks    Status  New      PT SHORT TERM GOAL #2   Title  Pt will improve TUG score to less than or equal to 13.5 seconds for decreased fall risk.    Time  4    Period  Weeks    Status  New      PT SHORT TERM GOAL #3   Title  Pt will perform sit<>stand transfers at least 8 of 10 reps with no hesitation and posterior lean.    Time  4    Period  Weeks    Status  New      PT SHORT TERM GOAL #4   Title  Pt will improve MiniBESTest score to at least 15/28 for decreased fall risk.    Time  4    Period  Weeks    Status  New      PT SHORT TERM GOAL #5   Title  Pt will improve ABC scale score by at least 10% to demonstrate improved balance confidence with daily activities.    Baseline  Eval:  57.5%    Time  4    Period  Weeks    Status  New        PT Long Term Goals - 11/30/17 1210      PT LONG TERM GOAL #1   Title  Pt will verbalize understanding of fall prevention/tips to reduce freezing with gait.  UPDATED TARGET for all LTGs 01/19/18    Time  8    Period  Weeks    Status  New      PT LONG TERM GOAL #2   Title  Pt will improve TUG cognitive to less than or equal to 15 seconds for decreased fall risk/improved dual tasking with gait.    Time  8    Period  Weeks    Status  New  PT LONG TERM GOAL #3   Title  Pt will improve MiniBESTest score to at least 19/28 for decreased fall risk.    Time  8    Period  Weeks    Status  New      PT LONG TERM GOAL #4   Title  Pt will improve gait velocity to at least 2 ft/sec for improved gait efficiency and safety in community    Time  8     Period  Weeks    Status  New      PT LONG TERM GOAL #5   Title  Pt will ambulate at least 1000 ft, indoor and outdoor surfaces, including ramps and inclines, with least restrictive assistive device modified independently for improved safety with outdoor gait.    Time  8    Period  Weeks    Status  New            Plan - 12/05/17 1433    Clinical Impression Statement  Session focused on gait training (improving step length and foot clearance; stair training), posture (throughout activities), and functional mobility (floor transfers). Patient requires increased time and cues for all activities. Even with demonstration of task he is unable to replicate the pre-gait wt-shifting/stepping acitivity due to decr cognition. Patient may benefit from a short aerobic exercise (?stepper) to begin session. Will continue to work towards goals.     Rehab Potential  Good    Clinical Impairments Affecting Rehab Potential  Memory deficits per speech eval notes    PT Frequency  2x / week    PT Duration  8 weeks plus eval; recert 02/09/31 to cover POC    PT Treatment/Interventions  ADLs/Self Care Home Management;DME Instruction;Gait training;Stair training;Functional mobility training;Therapeutic activities;Therapeutic exercise;Balance training;Neuromuscular re-education;Patient/family education    PT Next Visit Plan  ? aerobic/stepper activity initially for warm-up??; Continue Balance strategy work, sit<>stand, gait activities for foot clearance; work on floor to stand transfers per patient reques    Consulted and Agree with Plan of Care  Patient    Family Member Consulted  spouse       Patient will benefit from skilled therapeutic intervention in order to improve the following deficits and impairments:  Abnormal gait, Decreased balance, Decreased mobility, Difficulty walking, Postural dysfunction  Visit Diagnosis: Abnormal posture  Other abnormalities of gait and mobility  Unsteadiness on  feet     Problem List Patient Active Problem List   Diagnosis Date Noted  . TMJ (temporomandibular joint syndrome) 10/10/2017  . Parkinson's disease ([place]) 03/20/2017  . Bilateral lower extremity edema 03/20/2017  . Chronic venous insufficiency 09/13/2016  . Fatigue 03/15/2016  . Parkinsonian features 09/15/2015  . ED (erectile dysfunction) 09/15/2015  . MDD (major depressive disorder), recurrent episode, severe ([place]) 02/10/2014  . Nonspecific abnormal electrocardiogram (ECG) (EKG) 02/07/2014  . Non-compliant behavior 02/03/2014  . Morton's neuroma of left foot 08/07/2013  . 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 Hyperglycemia 01/26/2012  . Hypogonadism male 01/24/2012  . Syncope and collapse 01/20/2012  .  OSA (obstructive sleep apnea) 01/17/2011  . CHEST TIGHTNESS 02/18/2008    Rexanne Mano, PT 12/05/2017, 2:40 PM  [facility] [address] [place], [place], [address] Phone: [phone]   Fax:  [phone]  Name: FARRIS GEIMAN MRN: [id number] Date of Birth: 20-Oct-1950

## 2017-12-07 ENCOUNTER — Ambulatory Visit: Payer: Medicare Other | Admitting: Physical Therapy

## 2017-12-07 ENCOUNTER — Encounter: Payer: Self-pay | Admitting: Physical Therapy

## 2017-12-07 ENCOUNTER — Ambulatory Visit: Payer: Medicare Other | Admitting: Occupational Therapy

## 2017-12-07 ENCOUNTER — Ambulatory Visit: Payer: Medicare Other | Admitting: Speech Pathology

## 2017-12-07 DIAGNOSIS — R293 Abnormal posture: Secondary | ICD-10-CM | POA: Diagnosis not present

## 2017-12-07 DIAGNOSIS — R2689 Other abnormalities of gait and mobility: Secondary | ICD-10-CM

## 2017-12-07 DIAGNOSIS — R29818 Other symptoms and signs involving the nervous system: Secondary | ICD-10-CM

## 2017-12-07 DIAGNOSIS — R471 Dysarthria and anarthria: Secondary | ICD-10-CM

## 2017-12-07 DIAGNOSIS — R4189 Other symptoms and signs involving cognitive functions and awareness: Secondary | ICD-10-CM

## 2017-12-07 DIAGNOSIS — R29898 Other symptoms and signs involving the musculoskeletal system: Secondary | ICD-10-CM

## 2017-12-07 DIAGNOSIS — R2681 Unsteadiness on feet: Secondary | ICD-10-CM | POA: Diagnosis not present

## 2017-12-07 DIAGNOSIS — R278 Other lack of coordination: Secondary | ICD-10-CM

## 2017-12-07 DIAGNOSIS — R41841 Cognitive communication deficit: Secondary | ICD-10-CM

## 2017-12-07 NOTE — Therapy (Signed)
[facility] [address] [place], [place], [address] Phone: [phone]   Fax:  [phone]  Speech Language Pathology Treatment  Patient Details  Name: [name] [name] MRN: [id number] Date of Birth: 1951/01/08 Referring Provider: Alonza Bogus, DO   Encounter Date: 12/07/2017  End of Session - 12/07/17 1247    Visit Number  4    Number of Visits  17    Date for SLP Re-Evaluation  01/19/18    SLP Start Time  0935    SLP Stop Time   1015    SLP Time Calculation (min)  40 min    Activity Tolerance  Patient tolerated treatment well       Past Medical History:  Diagnosis Date  .  OSA (obstructive sleep apnea) 01/17/2011   npsg 2012:  AHI 67/hr. Auto titration 2012:  Optimal pressure 12cm.   . APPENDECTOMY, HX OF 02/18/2008   Qualifier: Diagnosis of  By: [place], [place]    . Cardiac murmur    as a child  . Depression   . Headache(784.0)   . HEADACHES, HX OF 02/18/2008   Qualifier: Diagnosis of  By: Danny Lawless CMA, [place]    . Parkinson disease ([place]) 11/2014  . Streptococcal meningitis    as an infant    Past Surgical History:  Procedure Laterality Date  . APPENDECTOMY  1967  . NASAL SINUS SURGERY     x 4 as a child  . VASECTOMY      There were no vitals filed for this visit.  Subjective Assessment - 12/07/17 0934    Subjective  "What is today, the 8th?"    Pain Score  2     Pain Location  Elbow    Pain Orientation  Left    Pain Descriptors / Indicators  Aching;Dull    Pain Type  Acute pain    Pain Onset  In the past 7 days    Pain Frequency  Intermittent    Aggravating Factors   pushing up to stand    Pain Relieving Factors  rest            ADULT SLP TREATMENT - 12/07/17 0936      General Information   Behavior/Cognition  Alert;Cooperative;Pleasant mood    Patient Positioning  Upright in chair      Treatment Provided   Treatment provided  Cognitive-Linquistic      Cognitive-Linquistic Treatment   Treatment focused on  Cognition;Dysarthria    Skilled Treatment  Pt did not bring his therapy notebook or home assignments to session. In 5 minutes simple conversation, pt demo'd appropriate attention by asking relevant questions, howev when pt responding to questions, slow processing, decreased attention noted with occasional min A question cues required. SLP targeted dysarthria with simple structured speech and short conversational tasks. Pt able to name strategy to improve clarity : "I have to feel like I'm shouting." SLP used loud /a/ to recalibrate pt's loudness, with subjective improvement in loudness and vocal effort in subsequent simple speech tasks. As cognitive load increased, pt's initial responses were sub WNL, with frequent visual and verbal cues required to increase volume. As session progressed, pt demo'd some awareness, self-correcting on 4 occasions.      Assessment / Recommendations / Plan   Plan  Continue with current plan of care      Progression Toward Goals   Progression toward goals  Progressing toward goals         SLP  Short Term Goals - 12/07/17 1238      SLP SHORT TERM GOAL #1   Title  pt will demo working [employer], [employer] appropriate for recall of 60% of details    Time  3    Period  Weeks    Status  On-going      SLP SHORT TERM GOAL #2   Title  pt will demo attention appropriate for 5 minutes simple conversation with modified independence (compensations)    Time  3    Period  Weeks    Status  On-going      SLP SHORT TERM GOAL #3   Title  Pt will utilize compensations for dysarthria in structured tasks with occasional min A     Time  3    Period  Weeks    Status  On-going       SLP Long Term Goals - 12/07/17 1251      SLP LONG TERM GOAL #1   Title  pt will demo attention skills adequate for functional verbal expression in 10 minutes simple-mod complex conversation over two sessions    Time  7    Period  Weeks    Status   On-going      SLP LONG TERM GOAL #2   Title  pt will complete functional working memory tasks with rare min A and compensatory measures    Time  7    Period  Weeks    Status  On-going      SLP LONG TERM GOAL #3   Title  Pt will utilize compensations to be intelligible with cues from spouse at sentence level over 2 sessions per pt/spouse report    Time  3    Period  Weeks    Status  On-going       Plan - 12/07/17 1250    Clinical Impression Statement   Pt continues to require extended time in simple conversations and basic questions due to cognitive impairments. Frequent verbal and visual cues required for carryover of dysarthria compensations. Continue skilled ST to maximize carryover of compensations for cognition and intelligibility.     Speech Therapy Frequency  2x / week    Treatment/Interventions  Language facilitation;Internal/external aids;Compensatory techniques;SLP instruction and feedback;Multimodal communcation approach;Cognitive reorganization;Functional tasks;Cueing hierarchy;Patient/family education    Potential to Achieve Goals  Good    Potential Considerations  Severity of impairments    Consulted and Agree with Plan of Care  Patient       Patient will benefit from skilled therapeutic intervention in order to improve the following deficits and impairments:   Cognitive communication deficit  Dysarthria and anarthria    Problem List Patient Active Problem List   Diagnosis Date Noted  . TMJ (temporomandibular joint syndrome) 10/10/2017  . Parkinson's disease ([place]) 03/20/2017  . Bilateral lower extremity edema 03/20/2017  . Chronic venous insufficiency 09/13/2016  . Fatigue 03/15/2016  . Parkinsonian features 09/15/2015  . ED (erectile dysfunction) 09/15/2015  . MDD (major depressive disorder), recurrent episode, severe ([place]) 02/10/2014  . Nonspecific abnormal electrocardiogram (ECG) (EKG) 02/07/2014  . Non-compliant behavior 02/03/2014  . Morton's neuroma  of left foot 08/07/2013  . 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 Hyperglycemia 01/26/2012  . Hypogonadism male 01/24/2012  . Syncope and collapse 01/20/2012  .  OSA (obstructive sleep apnea) 01/17/2011  . CHEST TIGHTNESS 02/18/2008   Deneise Lever, [place], [place] 12/07/2017, 12:53 PM  [facility]  [place], [place], [address] Phone: [phone]   Fax:  [phone]   Name: JACHOB MCCLEAN MRN: [id number] Date of Birth: [birth year]
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
## 2017-12-12 ENCOUNTER — Ambulatory Visit: Payer: Medicare Other | Admitting: Occupational Therapy

## 2017-12-12 ENCOUNTER — Ambulatory Visit: Payer: Medicare Other | Admitting: Speech Pathology

## 2017-12-12 ENCOUNTER — Ambulatory Visit: Payer: Medicare Other | Admitting: Physical Therapy

## 2017-12-12 ENCOUNTER — Encounter: Payer: Self-pay | Admitting: Speech Pathology

## 2017-12-12 ENCOUNTER — Encounter: Payer: Self-pay | Admitting: Physical Therapy

## 2017-12-12 DIAGNOSIS — R41841 Cognitive communication deficit: Secondary | ICD-10-CM | POA: Diagnosis not present

## 2017-12-12 DIAGNOSIS — R471 Dysarthria and anarthria: Secondary | ICD-10-CM

## 2017-12-12 DIAGNOSIS — R29898 Other symptoms and signs involving the musculoskeletal system: Secondary | ICD-10-CM | POA: Diagnosis not present

## 2017-12-12 DIAGNOSIS — R278 Other lack of coordination: Secondary | ICD-10-CM

## 2017-12-12 DIAGNOSIS — R293 Abnormal posture: Secondary | ICD-10-CM | POA: Diagnosis not present

## 2017-12-12 DIAGNOSIS — R29818 Other symptoms and signs involving the nervous system: Secondary | ICD-10-CM

## 2017-12-12 DIAGNOSIS — R2681 Unsteadiness on feet: Secondary | ICD-10-CM

## 2017-12-12 DIAGNOSIS — R4189 Other symptoms and signs involving cognitive functions and awareness: Secondary | ICD-10-CM

## 2017-12-12 DIAGNOSIS — R2689 Other abnormalities of gait and mobility: Secondary | ICD-10-CM | POA: Diagnosis not present

## 2017-12-12 NOTE — Therapy (Signed)
[facility] [address] [place], [place], [address] Phone: [phone]   Fax:  [phone]  Occupational Therapy Treatment  Patient Details  Name: [name] [name] MRN: [id number] Date of Birth: 1950-10-13 Referring Provider: Dr. Carles Collet   Encounter Date: 12/12/2017  OT End of Session - 12/12/17 1659    Visit Number  5    Number of Visits  17    Date for OT Re-Evaluation  02/03/18    Authorization Type  As of 12/01/17, Medicare primary/BCBS secondary    Authorization Time Period  75 visit limit combined for BC[employer]; cert date 0/6/26-06/06/84    Authorization - Visit Number  5    Authorization - Number of Visits  25    OT Start Time  1450    OT Stop Time  1530    OT Time Calculation (min)  40 min       Past Medical History:  Diagnosis Date  .  OSA (obstructive sleep apnea) 01/17/2011   npsg 2012:  AHI 67/hr. Auto titration 2012:  Optimal pressure 12cm.   . APPENDECTOMY, HX OF 02/18/2008   Qualifier: Diagnosis of  By: [place], [place]    . Cardiac murmur    as a child  . Depression   . Headache(784.0)   . HEADACHES, HX OF 02/18/2008   Qualifier: Diagnosis of  By: Danny Lawless CMA, [place]    . Parkinson disease ([place]) 11/2014  . Streptococcal meningitis    as an infant    Past Surgical History:  Procedure Laterality Date  . APPENDECTOMY  1967  . NASAL SINUS SURGERY     x 4 as a child  . VASECTOMY      There were no vitals filed for this visit.     Treatment:Pt brought in his exercise notebook, therapsit updated which exercises are still appropriate with pt. Therapist reviewed previously issued stretches for dress, reach for floor, cross leg at knee, bag exercise behind head and behind back. Dynamic rock and reach with bilateral UE's reaching into cabinets to retrieve items then placing on table with each UE and trunk rotation, min-mod facilitation.                      OT Short Term Goals -  11/09/17 1248      OT SHORT TERM GOAL #1   Title  Pt will be independent with PD specific  HEP.--    Time  4    Period  Weeks    Status  New    Target Date  12/09/17      OT SHORT TERM GOAL #2   Title  Pt will verbalize understanding of ways to prevent future complications and current available/appropriate community resources prn.    Time  4    Status  New      OT SHORT TERM GOAL #3   Title  Pt will improve bilateral hand coordination as shown by fastening/unfastening 3 buttons in 50sec or less.    Baseline  57.34    Time  4    Period  Weeks    Status  New      OT SHORT TERM GOAL #4   Title  Pt will report incr ease with donning pants socks/shoes using adaptive strategies/AE prn.    Time  4    Status  New        OT Long Term Goals - 11/09/17 1249      OT  LONG TERM GOAL #1   Title  Pt will verbalize understanding of adapted strategies for ADLs/IADLs--    Time  8    Period  Weeks    Status  New    Target Date  01/08/18      OT LONG TERM GOAL #2   Title  Pt will demonstrate improved functional standing balance as evidenced by increasing bilateral  standing functional reach to 10 inches or greater    Baseline  RUE 9.5 inches, LUE 8.5 in    Time  8    Period  Weeks    Status  New      OT LONG TERM GOAL #3   Title  Pt will improve bilateral hand coordination as shown by fastening/unfastening 3 buttons in 45sec or less.    Time  8    Period  Weeks    Status  New            Plan - 12/12/17 1700    Clinical Impression Statement  Pt is progressing slowly towards goals. Pt can benefit from reinforcement of large amplitued movments with ADLS.    Rehab Potential  Good    Current Impairments/barriers affecting progress:  cognitive deficits, depression    OT Frequency  2x / week    OT Duration  8 weeks    OT Treatment/Interventions  Self-care/ADL training;Moist Heat;Fluidtherapy;Balance training;Therapeutic activities;Cognitive remediation/compensation;Therapeutic  exercise;Ultrasound;Cryotherapy;Neuromuscular education;Visual/perceptual remediation/compensation;Passive range of motion;Functional Mobility Training;Patient/family education;Manual Therapy;Energy conservation;Paraffin    Plan  modified quadraped PWR! ADL strategies    Consulted and Agree with Plan of Care  Patient       Patient will benefit from skilled therapeutic intervention in order to improve the following deficits and impairments:  Decreased cognition, Impaired flexibility, Decreased mobility, Decreased coordination, Decreased endurance, Decreased range of motion, Decreased strength, Impaired UE functional use, Impaired tone, Impaired perceived functional ability, Decreased safety awareness, Difficulty walking, Decreased balance, Decreased activity tolerance, Impaired vision/preception, Improper spinal/pelvic alignment  Visit Diagnosis: Other symptoms and signs involving the nervous system  Other symptoms and signs involving the musculoskeletal system  Abnormal posture  Cognitive deficits  Unsteadiness on feet  Other lack of coordination    Problem List Patient Active Problem List   Diagnosis Date Noted  . TMJ (temporomandibular joint syndrome) 10/10/2017  . Parkinson's disease ([place]) 03/20/2017  . Bilateral lower extremity edema 03/20/2017  . Chronic venous insufficiency 09/13/2016  . Fatigue 03/15/2016  . Parkinsonian features 09/15/2015  . ED (erectile dysfunction) 09/15/2015  . MDD (major depressive disorder), recurrent episode, severe ([place]) 02/10/2014  . Nonspecific abnormal electrocardiogram (ECG) (EKG) 02/07/2014  . Non-compliant behavior 02/03/2014  . Morton's neuroma of left foot 08/07/2013  . 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 Hyperglycemia 01/26/2012  . Hypogonadism male 01/24/2012  . Syncope and collapse 01/20/2012  .  OSA (obstructive sleep apnea) 01/17/2011  . CHEST TIGHTNESS 02/18/2008     Krosby Ritchie 12/12/2017, 5:01 PM  [facility] [address] [place], [place], [address] Phone: [phone]   Fax:  [phone]  Name: [name] [name] MRN: [id number] Date of Birth: 05/24/1951

## 2017-12-12 NOTE — Therapy (Signed)
[facility] [address] [place], [place], [address] Phone: [phone]   Fax:  [phone]  Speech Language Pathology Treatment  Patient Details  Name: [name] [name] MRN: [id number] Date of Birth: March 24, 1951 Referring Provider: Alonza Bogus, DO   Encounter Date: 12/12/2017  End of Session - 12/12/17 1519    Visit Number  5    Number of Visits  17    Date for SLP Re-Evaluation  01/19/18    SLP Start Time  4580    SLP Stop Time   1447    SLP Time Calculation (min)  44 min    Activity Tolerance  Patient tolerated treatment well       Past Medical History:  Diagnosis Date  .  OSA (obstructive sleep apnea) 01/17/2011   npsg 2012:  AHI 67/hr. Auto titration 2012:  Optimal pressure 12cm.   . APPENDECTOMY, HX OF 02/18/2008   Qualifier: Diagnosis of  By: [place], [place]    . Cardiac murmur    as a child  . Depression   . Headache(784.0)   . HEADACHES, HX OF 02/18/2008   Qualifier: Diagnosis of  By: Danny Lawless CMA, [place]    . Parkinson disease ([place]) 11/2014  . Streptococcal meningitis    as an infant    Past Surgical History:  Procedure Laterality Date  . APPENDECTOMY  1967  . NASAL SINUS SURGERY     x 4 as a child  . VASECTOMY      There were no vitals filed for this visit.  Subjective Assessment - 12/12/17 1419    Subjective  "I need to talk with a firm loud voice"    Currently in Pain?  No/denies            ADULT SLP TREATMENT - 12/12/17 1419      General Information   Behavior/Cognition  Alert;Cooperative;Pleasant mood      Treatment Provided   Treatment provided  Cognitive-Linquistic      Cognitive-Linquistic Treatment   Treatment focused on  Cognition;Dysarthria    Skilled Treatment  Recalibrated volume with /a/ average 90dB , however voice notably hoarse. Modified /a/ using "Hey" and Hey you" initially with clear phonation achieved with "hey." Intellgibility and volume targeted in  reading/reasoning task - reading with average 74dB, verbal reasoning average 70db with occasional visual and verbal cues for volume. Memory strategies of using mini notebook to write down several topics/ideas to talk about before anticipated convresations. Pt states this will be helpful over the phone. I encouraged him to try the notebook in community also. Trained pt on strategy of using a basket on the counter to keep his ipod, phone, keys etc in a consistent place, as pt lost his ipod yesterday.  Organized pt's therapy notebook in sections for OT/PT/ST - cleaned out ST section from old notes.       Assessment / Recommendations / Plan   Plan  Continue with current plan of care      Progression Toward Goals   Progression toward goals  Progressing toward goals       SLP Education - 12/12/17 1514    Education provided  Yes    Education Details  use basket for items he is losing - practice consistently putting them in the basket after he is done using them; use mini note book to write down topics/phrases prior to anticipated conversations    Person(s) Educated  Patient    Methods  Explanation;Demonstration;Handout utilized prior  handouts - highlighted thes areas   utilized prior handouts - highlighted thes areas   Comprehension  Verbalized understanding       SLP Short Term Goals - 12/12/17 1517      SLP SHORT TERM GOAL #1   Title  pt will demo working Buyer, retail appropriate for recall of 60% of details    Time  2    Period  Weeks    Status  On-going      SLP SHORT TERM GOAL #2   Title  pt will demo attention appropriate for 5 minutes simple conversation with modified independence (compensations)    Time  2    Period  Weeks    Status  On-going      SLP SHORT TERM GOAL #3   Title  Pt will utilize compensations for dysarthria in structured tasks with occasional min A     Baseline  12/12/17    Time  2    Period  Weeks    Status  On-going       SLP Long Term Goals - 12/12/17  1518      SLP LONG TERM GOAL #1   Title  pt will demo attention skills adequate for functional verbal expression in 10 minutes simple-mod complex conversation over two sessions    Time  6    Period  Weeks    Status  On-going      SLP LONG TERM GOAL #2   Title  pt will complete functional working memory tasks with rare min A and compensatory measures    Time  6    Period  Weeks    Status  On-going      SLP LONG TERM GOAL #3   Title  Pt will utilize compensations to be intelligible with cues from spouse at sentence level over 2 sessions per pt/spouse report    Time  6    Period  Weeks    Status  On-going       Plan - 12/12/17 1517    Clinical Impression Statement   Pt continues to require extended time in simple conversations and basic questions due to cognitive impairments. Frequent verbal and visual cues required for carryover of dysarthria compensations. Continue skilled ST to maximize carryover of compensations for cognition and intelligibility.     Speech Therapy Frequency  2x / week    Treatment/Interventions  Language facilitation;Internal/external aids;Compensatory techniques;SLP instruction and feedback;Multimodal communcation approach;Cognitive reorganization;Functional tasks;Cueing hierarchy;Patient/family education    Potential to Achieve Goals  Good    Potential Considerations  Severity of impairments    Consulted and Agree with Plan of Care  Patient       Patient will benefit from skilled therapeutic intervention in order to improve the following deficits and impairments:   Dysarthria and anarthria  Cognitive communication deficit    Problem List Patient Active Problem List   Diagnosis Date Noted  . TMJ (temporomandibular joint syndrome) 10/10/2017  . Parkinson's disease ([place]) 03/20/2017  . Bilateral lower extremity edema 03/20/2017  . Chronic venous insufficiency 09/13/2016  . Fatigue 03/15/2016  . Parkinsonian features 09/15/2015  . ED (erectile  dysfunction) 09/15/2015  . MDD (major depressive disorder), recurrent episode, severe ([place]) 02/10/2014  . Nonspecific abnormal electrocardiogram (ECG) (EKG) 02/07/2014  . Non-compliant behavior 02/03/2014  . Morton's neuroma of left foot 08/07/2013  . 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 Hyperglycemia 01/26/2012  . Hypogonadism male 01/24/2012  . Syncope and collapse 01/20/2012  .  OSA (obstructive sleep apnea) 01/17/2011  . CHEST TIGHTNESS 02/18/2008    Adahlia Stembridge, Annye Rusk MS, CCC-SLP 12/12/2017, 3:20 PM  [facility] [address] [place], [place], [address] Phone: [phone]   Fax:  [phone]   Name: [name] [name] MRN: [id number] Date of Birth: 11-09-1950

## 2017-12-12 NOTE — Therapy (Signed)
[facility] [address] [place], [place], [address] Phone: [phone]   Fax:  [phone]  Physical Therapy Treatment  Patient Details  Name: [name] [name] MRN: [id number] Date of Birth: 04-Dec-1950 Referring Provider: Dr. Carles Collet   Encounter Date: 12/12/2017  PT End of Session - 12/12/17 1625    Visit Number  5    Number of Visits  17    Date for PT Re-Evaluation  01/29/18    Authorization Type  [employer]-75 visit limit combined PT, OT, speech    Authorization - Visit Number  5    Authorization - Number of Visits  75 75 PT, OT, speech combined    PT Start Time  1530    PT Stop Time  1613    PT Time Calculation (min)  43 min    Activity Tolerance  Patient tolerated treatment well    Behavior During Therapy  [facility] for tasks assessed/performed       Past Medical History:  Diagnosis Date  .  OSA (obstructive sleep apnea) 01/17/2011   npsg 2012:  AHI 67/hr. Auto titration 2012:  Optimal pressure 12cm.   . APPENDECTOMY, HX OF 02/18/2008   Qualifier: Diagnosis of  By: [place], [place]    . Cardiac murmur    as a child  . Depression   . Headache(784.0)   . HEADACHES, HX OF 02/18/2008   Qualifier: Diagnosis of  By: Danny Lawless CMA, [place]    . Parkinson disease ([place]) 11/2014  . Streptococcal meningitis    as an infant    Past Surgical History:  Procedure Laterality Date  . APPENDECTOMY  1967  . NASAL SINUS SURGERY     x 4 as a child  . VASECTOMY      There were no vitals filed for this visit.  Subjective Assessment - 12/12/17 1530    Patient is accompained by:    Patient Stated Goals  Pt's goals for therapy are to be doing exercise and to improve balance.    Currently in Pain?  Yes    Pain Score  3     Pain Location  Back    Pain Orientation  Mid;Lower    Pain Descriptors / Indicators  Aching    Pain Type  Chronic pain    Pain Onset  More than a month ago    Pain Frequency  Intermittent    Aggravating Factors   different activities    Pain Relieving Factors  sitting, lying down (recliner), motrin                         [facility]) - 12/12/17 1619    PWR! Up  10    PWR! Rock  10    Comments  After these 2 exercises, he left to use the restroom and session was over due to time          PT Education - 12/12/17 1621    Education provided  Yes    Education Details Increased time reviewing PT section of his notebook of exercises for continued appropriateness (removed repetitive sheets and tall kneeling to 1/2 kneeling due to pt now needs to use quadruped to supported 1/2 kneeling); several other sheets removed until we can review technique and assure he can do correctly; reviewed tips to reduce freezing in depth    Person(s) Educated  Patient    Methods  Explanation;Demonstration;Verbal cues;Handout;Tactile cues    Comprehension  Verbalized understanding;Returned demonstration;Need further instruction;Verbal cues required;Tactile cues required       PT Short Term Goals - 11/30/17 1209      PT SHORT TERM GOAL #1   Title  Pt will be independent with HEP to target Parkinson's specific deficits.  TARGET for all STGS 12/22/17    Time  4    Period  Weeks    Status  New      PT SHORT TERM GOAL #2   Title  Pt will improve TUG score to less than or equal to 13.5 seconds for decreased fall risk.    Time  4    Period  Weeks    Status  New      PT SHORT TERM GOAL #3   Title  Pt will perform sit<>stand transfers at least 8 of 10 reps with no hesitation and posterior lean.    Time  4    Period  Weeks    Status  New      PT SHORT TERM GOAL #4   Title  Pt will improve MiniBESTest score to at least 15/28 for decreased fall risk.    Time  4    Period  Weeks    Status  New      PT SHORT TERM GOAL #5   Title  Pt will improve ABC scale score by at least 10% to demonstrate improved balance confidence with daily activities.    Baseline  Eval:  57.5%    Time  4     Period  Weeks    Status  New        PT Long Term Goals - 11/30/17 1210      PT LONG TERM GOAL #1   Title  Pt will verbalize understanding of fall prevention/tips to reduce freezing with gait.  UPDATED TARGET for all LTGs 01/19/18    Time  8    Period  Weeks    Status  New      PT LONG TERM GOAL #2   Title  Pt will improve TUG cognitive to less than or equal to 15 seconds for decreased fall risk/improved dual tasking with gait.    Time  8    Period  Weeks    Status  New      PT LONG TERM GOAL #3   Title  Pt will improve MiniBESTest score to at least 19/28 for decreased fall risk.    Time  8    Period  Weeks    Status  New      PT LONG TERM GOAL #4   Title  Pt will improve gait velocity to at least 2 ft/sec for improved gait efficiency and safety in community    Time  8    Period  Weeks    Status  New      PT LONG TERM GOAL #5   Title  Pt will ambulate at least 1000 ft, indoor and outdoor surfaces, including ramps and inclines, with least restrictive assistive device modified independently for improved safety with outdoor gait.    Time  8    Period  Weeks    Status  New            Plan - 12/12/17 1626    Clinical Impression Statement  Patient began session stating his back was really bothering him. Seated pt in a chair with back support and took the opportunity to look at his therapy notebook that he carries with  his exercises/homework to be completed. From multiple rounds of PT, he had accumulated duplicates of several exercises or education sheets (ex. tips to reduce freezing). Patient reported at home he most often freezes as he walks down the hall and turns right into the bathroom, however after education and exercises there was not time to simulate this scenario. Will plan to address next session.     Rehab Potential  Good    Clinical Impairments Affecting Rehab Potential  Memory deficits per speech eval notes    PT Frequency  2x / week    PT Duration  8 weeks  plus eval; recert 7/49/44 to cover POC    PT Treatment/Interventions  ADLs/Self Care Home Management;DME Instruction;Gait training;Stair training;Functional mobility training;Therapeutic activities;Therapeutic exercise;Balance training;Neuromuscular re-education;Patient/family education    PT Next Visit Plan  ? Sci-Fit activity initially for warm-up?; simulate his entrance to his bathroom at home and tips to reduce freezing; continue to review the "extra exercises" [name] [name] removed from his notebook and whether they are appropriate to resume; stepping strategy work, sit<>stand, gait activities for foot clearance; work on floor to stand transfers per patient request    Consulted and Agree with Plan of Care  Patient       Patient will benefit from skilled therapeutic intervention in order to improve the following deficits and impairments:  Abnormal gait, Decreased balance, Decreased mobility, Difficulty walking, Postural dysfunction  Visit Diagnosis: Other symptoms and signs involving the nervous system  Other symptoms and signs involving the musculoskeletal system     Problem List Patient Active Problem List   Diagnosis Date Noted  . TMJ (temporomandibular joint syndrome) 10/10/2017  . Parkinson's disease ([place]) 03/20/2017  . Bilateral lower extremity edema 03/20/2017  . Chronic venous insufficiency 09/13/2016  . Fatigue 03/15/2016  . Parkinsonian features 09/15/2015  . ED (erectile dysfunction) 09/15/2015  . MDD (major depressive disorder), recurrent episode, severe ([place]) 02/10/2014  . Nonspecific abnormal electrocardiogram (ECG) (EKG) 02/07/2014  . Non-compliant behavior 02/03/2014  . Morton's neuroma of left foot 08/07/2013  . 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 Hyperglycemia 01/26/2012  . Hypogonadism male 01/24/2012  . Syncope and collapse 01/20/2012  .  OSA (obstructive sleep apnea) 01/17/2011  . CHEST TIGHTNESS 02/18/2008    Jeanie Cooks [name] [name].  PT 12/12/2017, 4:40 PM  [facility] [address] [place], [place], [address] Phone: [phone]   Fax:  [phone]  Name: [name] [name] MRN: [id number] Date of Birth: 1951/09/04

## 2017-12-14 ENCOUNTER — Ambulatory Visit: Payer: Medicare Other | Admitting: Occupational Therapy

## 2017-12-14 ENCOUNTER — Ambulatory Visit: Payer: Medicare Other

## 2017-12-14 ENCOUNTER — Ambulatory Visit: Payer: Medicare Other | Admitting: Physical Therapy

## 2017-12-14 ENCOUNTER — Encounter: Payer: Self-pay | Admitting: Physical Therapy

## 2017-12-14 DIAGNOSIS — R2681 Unsteadiness on feet: Secondary | ICD-10-CM | POA: Diagnosis not present

## 2017-12-14 DIAGNOSIS — R29818 Other symptoms and signs involving the nervous system: Secondary | ICD-10-CM | POA: Diagnosis not present

## 2017-12-14 DIAGNOSIS — R293 Abnormal posture: Secondary | ICD-10-CM

## 2017-12-14 DIAGNOSIS — R41841 Cognitive communication deficit: Secondary | ICD-10-CM | POA: Diagnosis not present

## 2017-12-14 DIAGNOSIS — R4189 Other symptoms and signs involving cognitive functions and awareness: Secondary | ICD-10-CM

## 2017-12-14 DIAGNOSIS — R278 Other lack of coordination: Secondary | ICD-10-CM

## 2017-12-14 DIAGNOSIS — R29898 Other symptoms and signs involving the musculoskeletal system: Secondary | ICD-10-CM | POA: Diagnosis not present

## 2017-12-14 DIAGNOSIS — R471 Dysarthria and anarthria: Secondary | ICD-10-CM

## 2017-12-14 DIAGNOSIS — R2689 Other abnormalities of gait and mobility: Secondary | ICD-10-CM | POA: Diagnosis not present

## 2017-12-14 NOTE — Therapy (Signed)
[facility] [address] [place], [place], [address] Phone: [phone]   Fax:  [phone]  Speech Language Pathology Treatment  Patient Details  Name: [name] [name] MRN: [id number] Date of Birth: 11-14-1950 Referring Provider: Alonza Bogus, DO   Encounter Date: 12/14/2017  End of Session - 12/14/17 1059    Visit Number  6    Number of Visits  17    Date for SLP Re-Evaluation  01/19/18    SLP Start Time  57    SLP Stop Time   1100    SLP Time Calculation (min)  42 min       Past Medical History:  Diagnosis Date  .  OSA (obstructive sleep apnea) 01/17/2011   npsg 2012:  AHI 67/hr. Auto titration 2012:  Optimal pressure 12cm.   . APPENDECTOMY, HX OF 02/18/2008   Qualifier: Diagnosis of  By: [place], [place]    . Cardiac murmur    as a child  . Depression   . Headache(784.0)   . HEADACHES, HX OF 02/18/2008   Qualifier: Diagnosis of  By: Danny Lawless CMA, [place]    . Parkinson disease ([place]) 11/2014  . Streptococcal meningitis    as an infant    Past Surgical History:  Procedure Laterality Date  . APPENDECTOMY  1967  . NASAL SINUS SURGERY     x 4 as a child  . VASECTOMY      There were no vitals filed for this visit.  Subjective Assessment - 12/14/17 1040    Subjective  "I was trying to talk soft so [name] wouldn't hear me."    Currently in Pain?  No/denies            ADULT SLP TREATMENT - 12/14/17 1041      General Information   Behavior/Cognition  Alert;Cooperative;Pleasant mood      Treatment Provided   Treatment provided  Cognitive-Linquistic      Cognitive-Linquistic Treatment   Treatment focused on  Cognition;Dysarthria    Skilled Treatment  Recalibrated volume with /a/ average upper 80s-low 90s dB, SLP req'd to provide usual A initially for full breath, and rarely at the end of the task. Loud /a/ and loud "hey-ah" for recalibration of speech loudness in conversation. Intellgibility  and volume targeted in reading/reasoning task - reading with average low-mid 70s dB, verbal reasoning average low-70s db with mod verbal cues for volume. SLP asked pt if he felt when he got softer and he answered correctly 65% of the time. Pt showed SLP his notebook with sections for each therapy and turned right to ST immediately.       Assessment / Recommendations / Plan   Plan  Continue with current plan of care      Progression Toward Goals   Progression toward goals  Progressing toward goals         SLP Short Term Goals - 12/14/17 1140      SLP SHORT TERM GOAL #1   Title  pt will demo working Buyer, retail appropriate for recall of 60% of details    Time  2    Period  Weeks    Status  On-going      SLP SHORT TERM GOAL #2   Title  pt will demo attention appropriate for 5 minutes simple conversation with modified independence (compensations)    Time  2    Period  Weeks    Status  On-going      SLP  SHORT TERM GOAL #3   Title  Pt will utilize compensations for dysarthria in structured tasks with occasional min A in three sessions    Baseline  12/12/17, 12-14-17    Time  2    Period  Weeks    Status  Revised       SLP Long Term Goals - 12/14/17 1141      SLP LONG TERM GOAL #1   Title  pt will demo attention skills adequate for functional verbal expression in 10 minutes simple-mod complex conversation over two sessions    Time  6    Period  Weeks    Status  On-going      SLP LONG TERM GOAL #2   Title  pt will complete functional working memory tasks with rare min A and compensatory measures    Time  6    Period  Weeks    Status  On-going      SLP LONG TERM GOAL #3   Title  Pt will utilize compensations to be intelligible with cues from spouse at sentence level over 2 sessions per pt/spouse report    Time  6    Period  Weeks    Status  On-going       Plan - 12/14/17 1113    Clinical Impression Statement   Pt continues to exhibit lower than WNL speech  conversational volume, requiring verbal and visual cues required for carryover of dysarthria compensations. Continue skilled ST to maximize carryover of compensations for cognition and intelligibility.   (Pended)     Speech Therapy Frequency  2x / week  (Pended)     Treatment/Interventions  Language facilitation;Internal/external aids;Compensatory techniques;SLP instruction and feedback;Multimodal communcation approach;Cognitive reorganization;Functional tasks;Cueing hierarchy;Patient/family education  (Pended)     Potential to Achieve Goals  Good  (Pended)     Potential Considerations  Severity of impairments  (Pended)     Consulted and Agree with Plan of Care  Patient  (Pended)        Patient will benefit from skilled therapeutic intervention in order to improve the following deficits and impairments:   Dysarthria and anarthria  Cognitive communication deficit    Problem List Patient Active Problem List   Diagnosis Date Noted  . TMJ (temporomandibular joint syndrome) 10/10/2017  . Parkinson's disease ([place]) 03/20/2017  . Bilateral lower extremity edema 03/20/2017  . Chronic venous insufficiency 09/13/2016  . Fatigue 03/15/2016  . Parkinsonian features 09/15/2015  . ED (erectile dysfunction) 09/15/2015  . MDD (major depressive disorder), recurrent episode, severe ([place]) 02/10/2014  . Nonspecific abnormal electrocardiogram (ECG) (EKG) 02/07/2014  . Non-compliant behavior 02/03/2014  . Morton's neuroma of left foot 08/07/2013  . 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 Hyperglycemia 01/26/2012  . Hypogonadism male 01/24/2012  . Syncope and collapse 01/20/2012  .  OSA (obstructive sleep apnea) 01/17/2011  . CHEST TIGHTNESS 02/18/2008    Josef Tourigny ,MS, CCC-SLP  12/14/2017, 11:42 AM  [facility] [address] [place], [place], [address] Phone: [phone]   Fax:   [phone]   Name: [name] [name] MRN: [id number] Date of Birth: [birth year]

## 2017-12-14 NOTE — Therapy (Signed)
[facility] [address] [place], [place], [address] Phone: [phone]   Fax:  [phone]  Physical Therapy Treatment  Patient Details  Name: [name] [name] MRN: [id number] Date of Birth: Sep 19, 1951 Referring Provider: Dr. Carles Collet   Encounter Date: 12/14/2017  PT End of Session - 12/14/17 1156    Visit Number  6    Number of Visits  17    Date for PT Re-Evaluation  01/29/18    Authorization Type  [employer]-75 visit limit combined PT, OT, speech    Authorization - Visit Number  5    Authorization - Number of Visits  75 75 PT, OT, speech combined    PT Start Time  1105    PT Stop Time  1135 pt requests to leave early today, and uses bathroom during session    PT Time Calculation (min)  30 min    Activity Tolerance  Patient tolerated treatment well    Behavior During Therapy  [facility] for tasks assessed/performed       Past Medical History:  Diagnosis Date  .  OSA (obstructive sleep apnea) 01/17/2011   npsg 2012:  AHI 67/hr. Auto titration 2012:  Optimal pressure 12cm.   . APPENDECTOMY, HX OF 02/18/2008   Qualifier: Diagnosis of  By: [place], [place]    . Cardiac murmur    as a child  . Depression   . Headache(784.0)   . HEADACHES, HX OF 02/18/2008   Qualifier: Diagnosis of  By: Danny Lawless CMA, [place]    . Parkinson disease ([place]) 11/2014  . Streptococcal meningitis    as an infant    Past Surgical History:  Procedure Laterality Date  . APPENDECTOMY  1967  . NASAL SINUS SURGERY     x 4 as a child  . VASECTOMY      There were no vitals filed for this visit.  Subjective Assessment - 12/14/17 1106    Subjective  No falls, no changes since last visit.    Patient is accompained by:  --    Patient Stated Goals  Pt's goals for therapy are to be doing exercise and to improve balance.    Currently in Pain?  No/denies    Pain Onset  In the past 7 days                      [facility] Adult PT  Treatment/Exercise - 12/14/17 0001      Transfers   Transfers  Sit to Stand;Stand to Sit    Sit to Stand  5: Supervision;Without upper extremity assist;From bed at least 10 reps    Sit to Stand Details  Verbal cues for sequencing;Verbal cues for technique Cues for maximal effort, forward lean, upright posture    Stand to Sit  6: Modified independent (Device/Increase time);Without upper extremity assist;To bed    Comments  With sit<>stand transfers, worked on initiation of gait, including rocking weightshifting side to side and high step marching in place, with optimal effort, (to offset freezing episodes, which patient c/o), then gait 10 ft into simulated narrowed space for bathroom, with cues provided for upright posture, use of visual cues looking ahead (to avoid veering to L into doorframe); multiple reps.          [facility]) - 12/14/17 1128    PWR! exercises  Moves in standing    PWR! Up  x 10    PWR! Rock  x 10  PWR! Twist  x 10    Comments  Attempted review of standing PWR! Moves as part of HEP; however, pt requests to leave session early today for another appointment.            PT Short Term Goals - 11/30/17 1209      PT SHORT TERM GOAL #1   Title  Pt will be independent with HEP to target Parkinson's specific deficits.  TARGET for all STGS 12/22/17    Time  4    Period  Weeks    Status  New      PT SHORT TERM GOAL #2   Title  Pt will improve TUG score to less than or equal to 13.5 seconds for decreased fall risk.    Time  4    Period  Weeks    Status  New      PT SHORT TERM GOAL #3   Title  Pt will perform sit<>stand transfers at least 8 of 10 reps with no hesitation and posterior lean.    Time  4    Period  Weeks    Status  New      PT SHORT TERM GOAL #4   Title  Pt will improve MiniBESTest score to at least 15/28 for decreased fall risk.    Time  4    Period  Weeks    Status  New      PT SHORT TERM GOAL #5   Title  Pt will improve ABC scale score by at  least 10% to demonstrate improved balance confidence with daily activities.    Baseline  Eval:  57.5%    Time  4    Period  Weeks    Status  New        PT Long Term Goals - 11/30/17 1210      PT LONG TERM GOAL #1   Title  Pt will verbalize understanding of fall prevention/tips to reduce freezing with gait.  UPDATED TARGET for all LTGs 01/19/18    Time  8    Period  Weeks    Status  New      PT LONG TERM GOAL #2   Title  Pt will improve TUG cognitive to less than or equal to 15 seconds for decreased fall risk/improved dual tasking with gait.    Time  8    Period  Weeks    Status  New      PT LONG TERM GOAL #3   Title  Pt will improve MiniBESTest score to at least 19/28 for decreased fall risk.    Time  8    Period  Weeks    Status  New      PT LONG TERM GOAL #4   Title  Pt will improve gait velocity to at least 2 ft/sec for improved gait efficiency and safety in community    Time  8    Period  Weeks    Status  New      PT LONG TERM GOAL #5   Title  Pt will ambulate at least 1000 ft, indoor and outdoor surfaces, including ramps and inclines, with least restrictive assistive device modified independently for improved safety with outdoor gait.    Time  8    Period  Weeks    Status  New            Plan - 12/14/17 1157    Clinical Impression Statement  Pt has multiple questions at  beginning of session today about offsetting freezing episodes, initiating gait.  Spent beginning of session reiterating transfer technique, need for/practice with increased effort and increased intensity for increased amplitude of movement, and then transitioned to simulating narrow area into bathroom at home.  Cued patient for foot clearance, and for use of visual cues straight ahead for decreased freezing episdes into bathroom.  Transitioned to standing PWR! Moves as review of his HEP; however, pt had to use bathroom during session and pt requested to leave early due to another appointment.     Rehab Potential  Good    Clinical Impairments Affecting Rehab Potential  Memory deficits per speech eval notes    PT Frequency  2x / week    PT Duration  8 weeks plus eval; recert 5/63/87 to cover POC    PT Treatment/Interventions  ADLs/Self Care Home Management;DME Instruction;Gait training;Stair training;Functional mobility training;Therapeutic activities;Therapeutic exercise;Balance training;Neuromuscular re-education;Patient/family education    PT Next Visit Plan  ? Sci-Fit activity initially for warm-up?; simulate his entrance to his bathroom at home and tips to reduce freezing; continue to review the "extra exercises" Jeani Hawking removed from his notebook and whether they are appropriate to resume; stepping strategy work, sit<>stand, gait activities for foot clearance; work on floor to stand transfers per patient request Check STGs next visit    Consulted and Agree with Plan of Care  Patient       Patient will benefit from skilled therapeutic intervention in order to improve the following deficits and impairments:  Abnormal gait, Decreased balance, Decreased mobility, Difficulty walking, Postural dysfunction  Visit Diagnosis: Abnormal posture  Other symptoms and signs involving the nervous system     Problem List Patient Active Problem List   Diagnosis Date Noted  . TMJ (temporomandibular joint syndrome) 10/10/2017  . Parkinson's disease ([place]) 03/20/2017  . Bilateral lower extremity edema 03/20/2017  . Chronic venous insufficiency 09/13/2016  . Fatigue 03/15/2016  . Parkinsonian features 09/15/2015  . ED (erectile dysfunction) 09/15/2015  . MDD (major depressive disorder), recurrent episode, severe ([place]) 02/10/2014  . Nonspecific abnormal electrocardiogram (ECG) (EKG) 02/07/2014  . Non-compliant behavior 02/03/2014  . Morton's neuroma of left foot 08/07/2013  . 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 Hyperglycemia 01/26/2012  . Hypogonadism male  01/24/2012  . Syncope and collapse 01/20/2012  .  OSA (obstructive sleep apnea) 01/17/2011  . CHEST TIGHTNESS 02/18/2008    Milca Sytsma W. 12/14/2017, 12:00 PM  Frazier Butt., PT   [facility] [address] [place], [place], [address] Phone: [phone]   Fax:  [phone]  Name: [name] [name] MRN: [id number] Date of Birth: Feb 20, 1951

## 2017-12-14 NOTE — Therapy (Signed)
[facility] [address] [place] [address] [place], [place], [address] Phone: [phone]   Fax:  [phone]  Occupational Therapy Treatment  Patient Details  Name: [name] [name] MRN: [id number] Date of Birth: [birth year] Referring Provider: Dr. Carles Collet   Encounter Date: 12/14/2017  OT End of Session - 12/14/17 1155    Visit Number  6    Number of Visits  17    Date for OT Re-Evaluation  02/03/18    Authorization Type  As of 12/01/17, Medicare primary/BCBS secondary    Authorization Time Period  75 visit limit combined for BC[employer]; cert date 03/04/82-10/08/15    Authorization - Visit Number  6    Authorization - Number of Visits  25    OT Start Time  0935    OT Stop Time  1015    OT Time Calculation (min)  40 min       Past Medical History:  Diagnosis Date  .  OSA (obstructive sleep apnea) 01/17/2011   npsg 2012:  AHI 67/hr. Auto titration 2012:  Optimal pressure 12cm.   . APPENDECTOMY, HX OF 02/18/2008   Qualifier: Diagnosis of  By: [place], [place]    . Cardiac murmur    as a child  . Depression   . Headache(784.0)   . HEADACHES, HX OF 02/18/2008   Qualifier: Diagnosis of  By: Danny Lawless CMA, [place]    . Parkinson disease ([place]) 11/2014  . Streptococcal meningitis    as an infant    Past Surgical History:  Procedure Laterality Date  . APPENDECTOMY  1967  . NASAL SINUS SURGERY     x 4 as a child  . VASECTOMY      There were no vitals filed for this visit.  Subjective Assessment - 12/14/17 0938    Subjective   Denies pain    Pertinent History  Parkinson's disease, seeing illusions, hx of falls, hx of significant depression, mild cognitive impairment, sleep apnea, hypogonadism, ADD    Currently in Pain?  No/denies           Treatment:Reviewed strategies for fastening / unfastening buttons, min v.c Arm bike x 5 mins level 1 for conditioning                OT Education - 12/14/17 1200    Education provided   Yes    Education Details  Modified quadraped PWR! up, rock and twist 10 reps each, min-mod v.c for positioning    Person(s) Educated  Patient    Methods  Explanation;Demonstration;Verbal cues    Comprehension  Verbalized understanding;Returned demonstration       OT Short Term Goals - 12/14/17 6160      OT SHORT TERM GOAL #1   Title  Pt will be independent with PD specific  HEP.--    Time  4    Period  Weeks    Status  On-going      OT SHORT TERM GOAL #2   Title  Pt will verbalize understanding of ways to prevent future complications and current available/appropriate community resources prn.    Time  4    Status  On-going      OT SHORT TERM GOAL #3   Title  Pt will improve bilateral hand coordination as shown by fastening/unfastening 3 buttons in 50sec or less.    Baseline  57.34    Time  4    Period  Weeks    Status  On-going  (Pended)  56.12 sec       OT SHORT TERM GOAL #4   Title  Pt will report incr ease with donning pants socks/shoes using adaptive strategies/AE prn.    Time  4    Status  Achieved        OT Long Term Goals - 11/09/17 1249      OT LONG TERM GOAL #1   Title  Pt will verbalize understanding of adapted strategies for ADLs/IADLs--    Time  8    Period  Weeks    Status  New    Target Date  01/08/18      OT LONG TERM GOAL #2   Title  Pt will demonstrate improved functional standing balance as evidenced by increasing bilateral  standing functional reach to 10 inches or greater    Baseline  RUE 9.5 inches, LUE 8.5 in    Time  8    Period  Weeks    Status  New      OT LONG TERM GOAL #3   Title  Pt will improve bilateral hand coordination as shown by fastening/unfastening 3 buttons in 45sec or less.    Time  8    Period  Weeks    Status  New            Plan - 12/14/17 1159    Clinical Impression Statement  Pt is progressing slowly towards goals. Therapist has requested that pt's  wife attends next visit for improved carryover.    Rehab  Potential  Good    Current Impairments/barriers affecting progress:  cognitive deficits, depression    OT Frequency  2x / week    OT Duration  8 weeks    OT Treatment/Interventions  Self-care/ADL training;Moist Heat;Fluidtherapy;Balance training;Therapeutic activities;Cognitive remediation/compensation;Therapeutic exercise;Ultrasound;Cryotherapy;Neuromuscular education;Visual/perceptual remediation/compensation;Passive range of motion;Functional Mobility Training;Patient/family education;Manual Therapy;Energy conservation;Paraffin    Plan  caregiver education if pt's wife comes in for therapy    Consulted and Agree with Plan of Care  Patient       Patient will benefit from skilled therapeutic intervention in order to improve the following deficits and impairments:     Visit Diagnosis: Other symptoms and signs involving the musculoskeletal system  Other symptoms and signs involving the nervous system  Abnormal posture  Unsteadiness on feet  Cognitive deficits  Other lack of coordination    Problem List Patient Active Problem List   Diagnosis Date Noted  . TMJ (temporomandibular joint syndrome) 10/10/2017  . Parkinson's disease ([place]) 03/20/2017  . Bilateral lower extremity edema 03/20/2017  . Chronic venous insufficiency 09/13/2016  . Fatigue 03/15/2016  . Parkinsonian features 09/15/2015  . ED (erectile dysfunction) 09/15/2015  . MDD (major depressive disorder), recurrent episode, severe ([place]) 02/10/2014  . Nonspecific abnormal electrocardiogram (ECG) (EKG) 02/07/2014  . Non-compliant behavior 02/03/2014  . Morton's neuroma of left foot 08/07/2013  . 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 Hyperglycemia 01/26/2012  . Hypogonadism male 01/24/2012  . Syncope and collapse 01/20/2012  .  OSA (obstructive sleep apnea) 01/17/2011  . CHEST TIGHTNESS 02/18/2008    [name] [name] 12/14/2017, 12:01 PM  [facility] [address] [place], [place], [address] Phone: [phone]   Fax:  [phone]  Name: [name] [name] MRN: [id number] Date of Birth: 1951-06-27

## 2017-12-14 NOTE — Patient Instructions (Signed)
  Please complete the assigned speech therapy homework prior to your next session and return it to the speech therapist at your next visit.  

## 2017-12-18 ENCOUNTER — Encounter (HOSPITAL_COMMUNITY): Payer: Self-pay | Admitting: Psychiatry

## 2017-12-18 ENCOUNTER — Ambulatory Visit (INDEPENDENT_AMBULATORY_CARE_PROVIDER_SITE_OTHER): Payer: Medicare Other | Admitting: Psychiatry

## 2017-12-18 DIAGNOSIS — F332 Major depressive disorder, recurrent severe without psychotic features: Secondary | ICD-10-CM

## 2017-12-18 DIAGNOSIS — R450 Nervousness: Secondary | ICD-10-CM

## 2017-12-18 DIAGNOSIS — Z811 Family history of alcohol abuse and dependence: Secondary | ICD-10-CM

## 2017-12-18 DIAGNOSIS — F419 Anxiety disorder, unspecified: Secondary | ICD-10-CM | POA: Diagnosis not present

## 2017-12-18 DIAGNOSIS — Z813 Family history of other psychoactive substance abuse and dependence: Secondary | ICD-10-CM | POA: Diagnosis not present

## 2017-12-18 DIAGNOSIS — R251 Tremor, unspecified: Secondary | ICD-10-CM | POA: Diagnosis not present

## 2017-12-18 MED ORDER — BUPROPION HCL ER (XL) 300 MG PO TB24: ORAL_TABLET | ORAL | 0 refills | Status: DC

## 2017-12-18 MED ORDER — CITALOPRAM HYDROBROMIDE 40 MG PO TABS: 40.0000 mg | ORAL_TABLET | Freq: Every day | ORAL | 0 refills | Status: DC

## 2017-12-18 NOTE — Progress Notes (Signed)
[facility] MD/PA/NP OP Progress Note  12/18/2017 8:31 AM [name] [name]  MRN:  [id number]  Chief Complaint: My Parkinson is getting worse.  HPI: Patient came for his follow-up appointment with his wife.  Patient admitted lately his Parkinson taking a big toll on his general health.  He is worried about his health.  He admitted slow to response, difficulty remembering things, and some time he freezes himself.  He also concerned about visual hallucination.  He saw a neurologist on February 19 and it was recommended to decrease Requip to help the hallucination.  He continues to attend boxing club but he admitted lately he gets easily tired with low energy.  However he denies that his depression is a stable and he does not want to change his medication.  He denies any crying spells or any feeling of hopelessness or worthlessness.  He admitted visual hallucination but denies any paranoia, suicidal thoughts or homicidal thought.  He does do crossword puzzles at home to himself active.  He also started counseling with Jerl Santos and he like to continue therapy in the future.  He is also doing physical therapy.  He admitted waking up in the night because of a large to use the bathroom.  He is scheduled to see his urology and he like to address this issue with them.  He feels guilty that sometime a burden to his family but he denies any feeling of hopelessness or worthlessness.  He is taking Celexa and Wellbutrin.  He has mild tremors but that does not bother him.  He does not drive and his wife takes him to the doctor's appointment.  His wife is very supportive and very active in the treatment plan.  Patient denies drinking alcohol or using any illegal substances.  Visit Diagnosis:    ICD-10-CM   1. 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 Patient  denies any history of suicidal attempt or any inpatient psychiatric treatment. He denies any history of mania, psychosis, paranoia or any hallucination. In the past he had tried Effexor, Abilify, Paxil, Cymbalta, Zoloft, amitriptyline, Pristiq, Britnellex, Celexa, Deplin,Vyvanseand Latuda. He is seen in this office since September 2009. He has seen multiple psychiatrists in the past.   Past Medical History:  Past Medical History:  Diagnosis Date  .  OSA (obstructive sleep apnea) 01/17/2011   npsg 2012:  AHI 67/hr. Auto titration 2012:  Optimal pressure 12cm.   . APPENDECTOMY, HX OF 02/18/2008   Qualifier: Diagnosis of  By: [place], [place]    . Cardiac murmur    as a child  . Depression   . Headache(784.0)   . HEADACHES, HX OF 02/18/2008   Qualifier: Diagnosis of  By: Danny Lawless CMA, [place]    . Parkinson disease ([place]) 11/2014  . Streptococcal meningitis    as an infant    Past Surgical History:  Procedure Laterality Date  . APPENDECTOMY  1967  . NASAL SINUS SURGERY     x 4 as a child  . VASECTOMY      Family Psychiatric History: Reviewed.  Family History:  Family History  Problem Relation Age of Onset  . Lung cancer Father   . Alcohol abuse Brother   . Drug abuse Brother   . Seizures Daughter     Social History:  Social History   Socioeconomic History  .  Marital status: Married    Spouse name: None  . Number of children: Y  . Years of education: None  . Highest education level: None  Social Needs  . Financial resource strain: None  . Food insecurity - worry: None  . Food insecurity - inability: None  . Transportation needs - medical: None  . Transportation needs - non-medical: None  Occupational History  . Occupation: Armed forces operational officer: [employer]    Comment: Special educational needs teacher  Tobacco Use  . Smoking status: Never Smoker  . Smokeless tobacco: Never Used  Substance and Sexual Activity  . Alcohol use: No    Alcohol/week: 0.0 oz  . Drug use: No  .  Sexual activity: Yes    Birth control/protection: None  Other Topics Concern  . None  Social History Narrative  . None    Allergies: No Known Allergies  Metabolic Disorder Labs: Recent Results (from the past 2160 hour(s))  CBC     Status: None   Collection Time: 10/09/17 11:38 AM  Result Value Ref Range   WBC 5.7 4.0 - 10.5 K/uL   RBC 4.38 4.22 - 5.81 Mil/uL   Platelets 276.0 150.0 - 400.0 K/uL   Hemoglobin 13.4 13.0 - 17.0 g/dL   HCT 40.5 39.0 - 52.0 %   MCV 92.6 78.0 - 100.0 fl   MCHC 33.1 30.0 - 36.0 g/dL   RDW 13.8 11.5 - 15.5 %  Comprehensive metabolic panel     Status: Abnormal   Collection Time: 10/09/17 11:38 AM  Result Value Ref Range   Sodium 138 135 - 145 mEq/L   Potassium 4.3 3.5 - 5.1 mEq/L   Chloride 104 96 - 112 mEq/L   CO2 28 19 - 32 mEq/L   Glucose, Bld 113 (H) 70 - 99 mg/dL   BUN 16 6 - 23 mg/dL   Creatinine, Ser 1.14 0.40 - 1.50 mg/dL   Total Bilirubin 0.6 0.2 - 1.2 mg/dL   Alkaline Phosphatase 66 39 - 117 U/L   AST 13 0 - 37 U/L   ALT 21 0 - 53 U/L   Total Protein 7.2 6.0 - 8.3 g/dL   Albumin 4.3 3.5 - 5.2 g/dL   Calcium 9.3 8.4 - 10.5 mg/dL   GFR 68.69 >60.00 mL/min  Lipid panel     Status: None   Collection Time: 10/09/17 11:38 AM  Result Value Ref Range   Cholesterol 172 0 - 200 mg/dL    Comment: ATP III Classification       Desirable:  < 200 mg/dL               Borderline High:  200 - 239 mg/dL          High:  > = 240 mg/dL   Triglycerides 120.0 0.0 - 149.0 mg/dL    Comment: Normal:  <150 mg/dLBorderline High:  150 - 199 mg/dL   HDL 52.30 >39.00 mg/dL   VLDL 24.0 0.0 - 40.0 mg/dL   LDL Cholesterol 96 0 - 99 mg/dL   Total CHOL/HDL Ratio 3     Comment:                Men          Women1/2 Average Risk     3.4          3.3Average Risk          5.0          4.42X Average Risk  9.6          7.13X Average Risk          15.0          11.0                       NonHDL 120.00     Comment: NOTE:  Non-HDL goal should be 30 mg/dL higher than  patient's LDL goal (i.e. LDL goal of < 70 mg/dL, would have non-HDL goal of < 100 mg/dL)  Hemoglobin A1c     Status: None   Collection Time: 10/09/17 11:38 AM  Result Value Ref Range   Hgb A1c MFr Bld 6.1 4.6 - 6.5 %    Comment: Glycemic Control Guidelines for People with Diabetes:Non Diabetic:  <6%Goal of Therapy: <7%Additional Action Suggested:  >8%    Lab Results  Component Value Date   HGBA1C 6.1 10/09/2017   No results found for: PROLACTIN Lab Results  Component Value Date   CHOL 172 10/09/2017   TRIG 120.0 10/09/2017   HDL 52.30 10/09/2017   CHOLHDL 3 10/09/2017   VLDL 24.0 10/09/2017   LDLCALC 96 10/09/2017   LDLCALC 127 (H) 03/14/2016   Lab Results  Component Value Date   TSH 1.16 03/14/2016   TSH 0.80 02/06/2014    Therapeutic Level Labs: No results found for: LITHIUM No results found for: VALPROATE No components found for:  CBMZ  Current Medications: Current Outpatient Medications  Medication Sig Dispense Refill  . amoxicillin-clarithromycin-lansoprazole (PREVPAC) combo pack Take by mouth 2 (two) times daily. Follow package directions.    . B Complex-C (B-COMPLEX WITH VITAMIN C) tablet Take 1 tablet by mouth daily.    Marland Kitchen buPROPion (WELLBUTRIN XL) 300 MG 24 hr tablet TAKE 1 TABLET BY MOUTH EVERY DAY IN THE MORNING 90 tablet 0  . carbidopa-levodopa (SINEMET IR) 25-100 MG tablet TAKE 1 TABLET BY MOUTH THREE TIMES A DAY 270 tablet 1  . citalopram (CELEXA) 40 MG tablet Take 1 tablet (40 mg total) by mouth daily. 90 tablet 0  . fesoterodine (TOVIAZ) 4 MG TB24 tablet Take 1 tablet (4 mg total) by mouth daily. 30 tablet 0  . Omega 3-6-9 Fatty Acids (OMEGA 3-6-9 PO) Take by mouth.    Marland Kitchen rOPINIRole (REQUIP) 2 MG tablet Take 1 tablet (2 mg total) by mouth 3 (three) times daily. Take 2 tablets in the AM, 1 in the afternoon and 1 in the evening (Patient taking differently: Take 2 mg by mouth 3 (three) times daily. ) 270 tablet 1  . sildenafil (VIAGRA) 100 MG tablet Take 0.5-1  tablets (50-100 mg total) by mouth daily as needed for erectile dysfunction. 10 tablet 2  . TURMERIC CURCUMIN PO Take by mouth.     No current facility-administered medications for this visit.      Musculoskeletal: Strength & Muscle Tone: decreased Gait & Station: shuffle Patient leans: Front and Backward  Psychiatric Specialty Exam: Review of Systems  Constitutional: Negative.   HENT: Negative.   Respiratory: Negative.   Cardiovascular: Negative.   Skin: Negative.   Neurological: Positive for tremors.  Psychiatric/Behavioral: Positive for memory loss. The patient is nervous/anxious.     Blood pressure (!) 147/75, pulse 70, height 6\' 2"  (1.88 m), weight 297 lb (134.7 kg), SpO2 98 %.Body mass index is 38.13 kg/m.  General Appearance: Casual  Eye Contact:  Fair  Speech:  Slow  Volume:  Decreased  Mood:  Euthymic  Affect:  Restricted  Thought  Process:  Goal Directed  Orientation:  Full (Time, Place, and Person)  Thought Content: Rumination and Visual hallucination   Suicidal Thoughts:  No  Homicidal Thoughts:  No  Memory:  Immediate;   Fair Recent;   Fair Remote;   Fair  Judgement:  Good  Insight:  Good  Psychomotor Activity:  Decreased, Shuffling Gait and Tremor  Concentration:  Concentration: Fair and Attention Span: Fair  Recall:  [employer] of Knowledge: Good  Language: Good  Akathisia:  No  Handed:  Right  AIMS (if indicated): not done  Assets:  Communication Skills Desire for Improvement Housing Resilience Social Support  ADL's:  Intact  Cognition: Impaired,  Mild  Sleep:  Good   Screenings:   Assessment and Plan: Depressive disorder, recurrent.  Anxiety disorder NOS.  I review collect information from the neurology and recent blood work results.  Patient started seeing therapist Jon Billings for counseling.  He endorsed Parkinson is getting worse but he realized he need to fight back with resources he have.  Encouraged to continue boxing club and  neurocognitive exercise.  Patient does not want to change his medication.  Continue Wellbutrin XL 300 mg daily and Celexa 40 mg daily.  Hemoglobin A1c is 6.1.  Patient and his wife has a lot of questions which were answered.  Recommended to call us back if he has any question, concern for feel worsening of the symptoms.  Follow-up in 3 months.  Time spent 25 minutes.  More than 50% of the time spent in psychoeducation, counseling, coordination of care,  providing answers to their question and reviewing collateral information.   Kathlee Nations, MD 12/18/2017, 8:31 AM

## 2017-12-19 ENCOUNTER — Ambulatory Visit: Payer: Medicare Other

## 2017-12-19 ENCOUNTER — Encounter: Payer: Self-pay | Admitting: Physical Therapy

## 2017-12-19 ENCOUNTER — Ambulatory Visit: Payer: Federal, State, Local not specified - PPO | Admitting: Psychology

## 2017-12-19 ENCOUNTER — Ambulatory Visit: Payer: Medicare Other | Admitting: Physical Therapy

## 2017-12-19 ENCOUNTER — Ambulatory Visit: Payer: Medicare Other | Admitting: Occupational Therapy

## 2017-12-19 DIAGNOSIS — R2689 Other abnormalities of gait and mobility: Secondary | ICD-10-CM

## 2017-12-19 DIAGNOSIS — R4701 Aphasia: Secondary | ICD-10-CM

## 2017-12-19 DIAGNOSIS — R471 Dysarthria and anarthria: Secondary | ICD-10-CM

## 2017-12-19 DIAGNOSIS — R29818 Other symptoms and signs involving the nervous system: Secondary | ICD-10-CM | POA: Diagnosis not present

## 2017-12-19 DIAGNOSIS — R2681 Unsteadiness on feet: Secondary | ICD-10-CM

## 2017-12-19 DIAGNOSIS — R29898 Other symptoms and signs involving the musculoskeletal system: Secondary | ICD-10-CM

## 2017-12-19 DIAGNOSIS — R293 Abnormal posture: Secondary | ICD-10-CM | POA: Diagnosis not present

## 2017-12-19 DIAGNOSIS — R41840 Attention and concentration deficit: Secondary | ICD-10-CM

## 2017-12-19 DIAGNOSIS — R41841 Cognitive communication deficit: Secondary | ICD-10-CM | POA: Diagnosis not present

## 2017-12-19 DIAGNOSIS — R278 Other lack of coordination: Secondary | ICD-10-CM

## 2017-12-19 NOTE — Therapy (Signed)
[facility] [address] [place], [place], [address] Phone: [phone]   Fax:  [phone]  Occupational Therapy Treatment  Patient Details  Name: [name] [name] MRN: [id number] Date of Birth: 25-May-1951 Referring Provider: Dr. Carles Collet   Encounter Date: 12/19/2017  OT End of Session - 12/19/17 0913    Visit Number  7    Number of Visits  17    Date for OT Re-Evaluation  02/03/18    Authorization Type  As of 12/01/17, Medicare primary/BCBS secondary    Authorization Time Period  75 visit limit combined for BCBS; cert date 05/04/41-12/06/34    Authorization - Visit Number  7    Authorization - Number of Visits  25    OT Start Time  0848    OT Stop Time  0930    OT Time Calculation (min)  42 min    Activity Tolerance  Patient tolerated treatment well    Behavior During Therapy  [facility] for tasks assessed/performed       Past Medical History:  Diagnosis Date  .  OSA (obstructive sleep apnea) 01/17/2011   npsg 2012:  AHI 67/hr. Auto titration 2012:  Optimal pressure 12cm.   . APPENDECTOMY, HX OF 02/18/2008   Qualifier: Diagnosis of  By: [place], [place]    . Cardiac murmur    as a child  . Depression   . Headache(784.0)   . HEADACHES, HX OF 02/18/2008   Qualifier: Diagnosis of  By: Danny Lawless CMA, [place]    . Parkinson disease ([place]) 11/2014  . Streptococcal meningitis    as an infant    Past Surgical History:  Procedure Laterality Date  . APPENDECTOMY  1967  . NASAL SINUS SURGERY     x 4 as a child  . VASECTOMY      There were no vitals filed for this visit.  Subjective Assessment - 12/19/17 0912    Subjective   Denies pain    Pertinent History  Parkinson's disease, seeing illusions, hx of falls, hx of significant depression, mild cognitive impairment, sleep apnea, hypogonadism, ADD    Patient Stated Goals  improved balance and ease with ADLs    Currently in Pain?  No/denies                  Treatment: Arm bike x 6 mins level 1 for conditioning, pt maintained 40 rpm Flipping playing card with large amplitude movements, min v.c /demonstration          OT Education - 12/19/17 0941    Education provided  Yes    Education Details  ways to prevent future complications, PWR! hands, PWR! up rock and twist in modified quadraped    Person(s) Educated  Patient    Methods  Explanation;Demonstration    Comprehension  Verbalized understanding;Verbal cues required;Returned demonstration min v.c and demonstration of exercises   min v.c and demonstration of exercises      OT Short Term Goals - 12/14/17 1443      OT SHORT TERM GOAL #1   Title  Pt will be independent with PD specific  HEP.--    Time  4    Period  Weeks    Status  On-going      OT SHORT TERM GOAL #2   Title  Pt will verbalize understanding of ways to prevent future complications and current available/appropriate community resources prn.    Time  4    Status  On-going  OT SHORT TERM GOAL #3   Title  Pt will improve bilateral hand coordination as shown by fastening/unfastening 3 buttons in 50sec or less.    Baseline  57.34    Time  4    Period  Weeks    Status  On-going  (Pended)  56.12 sec       OT SHORT TERM GOAL #4   Title  Pt will report incr ease with donning pants socks/shoes using adaptive strategies/AE prn.    Time  4    Status  Achieved        OT Long Term Goals - 11/09/17 1249      OT LONG TERM GOAL #1   Title  Pt will verbalize understanding of adapted strategies for ADLs/IADLs--    Time  8    Period  Weeks    Status  New    Target Date  01/08/18      OT LONG TERM GOAL #2   Title  Pt will demonstrate improved functional standing balance as evidenced by increasing bilateral  standing functional reach to 10 inches or greater    Baseline  RUE 9.5 inches, LUE 8.5 in    Time  8    Period  Weeks    Status  New      OT LONG TERM GOAL #3   Title  Pt will  improve bilateral hand coordination as shown by fastening/unfastening 3 buttons in 45sec or less.    Time  8    Period  Weeks    Status  New            Plan - 12/19/17 0939    Clinical Impression Statement  Pt is progressing slowly towards goals.Pt reports that he has not been performing PWR! exercises at home, therapist requested pt has his wife come in for increased carryover.    Rehab Potential  Good    Current Impairments/barriers affecting progress:  cognitive deficits, depression    OT Frequency  2x / week    OT Duration  8 weeks    OT Treatment/Interventions  Self-care/ADL training;Moist Heat;Fluidtherapy;Balance training;Therapeutic activities;Cognitive remediation/compensation;Therapeutic exercise;Ultrasound;Cryotherapy;Neuromuscular education;Visual/perceptual remediation/compensation;Passive range of motion;Functional Mobility Training;Patient/family education;Manual Therapy;Energy conservation;Paraffin    Plan   review coordination activities, caregiver education if pt's wife comes in for therapy    OT Home Exercise Plan  PWR! moves seated     Consulted and Agree with Plan of Care  Patient       Patient will benefit from skilled therapeutic intervention in order to improve the following deficits and impairments:     Visit Diagnosis: Other symptoms and signs involving the musculoskeletal system  Other symptoms and signs involving the nervous system  Abnormal posture  Other lack of coordination  Attention and concentration deficit  Other abnormalities of gait and mobility    Problem List Patient Active Problem List   Diagnosis Date Noted  . TMJ (temporomandibular joint syndrome) 10/10/2017  . Parkinson's disease ([place]) 03/20/2017  . Bilateral lower extremity edema 03/20/2017  . Chronic venous insufficiency 09/13/2016  . Fatigue 03/15/2016  . Parkinsonian features 09/15/2015  . ED (erectile dysfunction) 09/15/2015  . MDD (major depressive disorder),  recurrent episode, severe ([place]) 02/10/2014  . Nonspecific abnormal electrocardiogram (ECG) (EKG) 02/07/2014  . Non-compliant behavior 02/03/2014  . Morton's neuroma of left foot 08/07/2013  . 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 Hyperglycemia 01/26/2012  . Hypogonadism male 01/24/2012  . Syncope and collapse 01/20/2012  .  OSA (obstructive sleep apnea) 01/17/2011  . CHEST TIGHTNESS 02/18/2008    Kinsler Soeder 12/19/2017, 9:43 AM  [facility] [address] [place], [place], [address] Phone: [phone]   Fax:  [phone]  Name: [name] [name] MRN: [id number] Date of Birth: May 02, 1951

## 2017-12-19 NOTE — Patient Instructions (Signed)
Ways to prevent future Parkinson's related complications: 1.   Exercise regularly! PWR! Exercises can help to keep you stretched out, they are important! It is important to exercise every day. Exercise is medicine for people with PD.   2.   Focus on BIGGER movements during daily activities- really reach overhead, straighten elbows and extend fingers  3.   When dressing or reaching for your seatbelt make sure to use your body to assist by twisting while you reach-this can help to minimize stress on the shoulder and reduce the risk of a rotator cuff tear 4.   Swing your arms when you walk! People with PD are at increased risk for frozen shoulder and swinging your arms can reduce this risk.

## 2017-12-19 NOTE — Therapy (Signed)
[facility] [address] [place], [place], [address] Phone: [phone]   Fax:  [phone]  Physical Therapy Treatment  Patient Details  Name: DAVIER TRAMELL MRN: [id number] Date of Birth: 03/31/1951 Referring Provider: Dr. Carles Collet   Encounter Date: 12/19/2017  PT End of Session - 12/19/17 0849    Visit Number  7    Number of Visits  17    Date for PT Re-Evaluation  01/29/18    Authorization Type  [employer]-75 visit limit combined PT, OT, speech    Authorization - Visit Number  7    Authorization - Number of Visits  75 75 PT, OT, speech combined    PT Start Time  0800    PT Stop Time  0845    PT Time Calculation (min)  45 min    Equipment Utilized During Treatment  Gait belt    Activity Tolerance  Patient tolerated treatment well    Behavior During Therapy  WFL for tasks assessed/performed       Past Medical History:  Diagnosis Date  .  OSA (obstructive sleep apnea) 01/17/2011   npsg 2012:  AHI 67/hr. Auto titration 2012:  Optimal pressure 12cm.   . APPENDECTOMY, HX OF 02/18/2008   Qualifier: Diagnosis of  By: [place], [place]    . Cardiac murmur    as a child  . Depression   . Headache(784.0)   . HEADACHES, HX OF 02/18/2008   Qualifier: Diagnosis of  By: Danny Lawless CMA, [place]    . Parkinson disease ([place]) 11/2014  . Streptococcal meningitis    as an infant    Past Surgical History:  Procedure Laterality Date  . APPENDECTOMY  1967  . NASAL SINUS SURGERY     x 4 as a child  . VASECTOMY      There were no vitals filed for this visit.  Subjective Assessment - 12/19/17 0803    Subjective  No falls, no changes since last visit. Does not feel his confidence in his mobility is improving.    Patient Stated Goals  Pt's goals for therapy are to be doing exercise and to improve balance.    Currently in Pain?  No/denies    Pain Onset  In the past 7 days       Session focused on assessment of progress  towards STGs;  Transfer sit to stand without posterior loss of balance or hesitation 10 of 10 repetitions  ABC assessment-- 57.5%    [facility] PT Assessment - 12/19/17 0001      Timed Up and Go Test   Normal TUG (seconds)  12.44    Cognitive TUG (seconds)  15.69        Mini-BESTest: Balance Evaluation Systems Test  2005-2013 [employer]. All rights reserved. ________________________________________________________________________________________Anticipatory_________Subscore___4__/6 1. SIT TO STAND Instruction: "Cross your arms across your chest. Try not to use your hands unless you must.Do not let your legs lean against the back of the chair when you stand. Please stand up now." x(2) Normal: Comes to stand without use of hands and stabilizes independently. (1) Moderate: Comes to stand WITH use of hands on first attempt. (0) Severe: Unable to stand up from chair without assistance, OR needs several attempts with use of hands. 2. RISE TO TOES Instruction: "Place your feet shoulder width apart. Place your hands on your hips. Try to rise as high as you can onto your toes. I will count out loud to 3 seconds.  Try to hold this pose for at least 3 seconds. Look straight ahead. Rise now." (2) Normal: Stable for 3 s with maximum height. x(1) Moderate: Heels up, but not full range (smaller than when holding hands), OR noticeable instability for 3 s. (0) Severe: < 3 s. 3. STAND ON ONE LEG Instruction: "Look straight ahead. Keep your hands on your hips. Lift your leg off of the ground behind you without touching or resting your raised leg upon your other standing leg. Stay standing on one leg as long as you can. Look straight ahead. Lift now." Left: Time in Seconds Trial 1:_1.0____Trial 2:__.69___ (2) Normal: 20 s. x(1) Moderate: < 20 s. (0) Severe: Unable. Right: Time in Seconds Trial 1:__7.12___Trial 2:___3.62__ (2) Normal: 20 s. x(1) Moderate: < 20 s. (0) Severe:  Unable To score each side separately use the trial with the longest time. To calculate the sub-score and total score use the side [left or right] with the lowest numerical score [i.e. the worse side]. ______________________________________________________________________________________Reactive Postural Control___________Subscore:__0___/6 4. COMPENSATORY STEPPING CORRECTION- FORWARD Instruction: "Stand with your feet shoulder width apart, arms at your sides. Lean forward against my hands beyond your forward limits. When I let go, do whatever is necessary, including taking a step, to avoid a fall." (2) Normal: Recovers independently with a single, large step (second realignment step is allowed). (1) Moderate: More than one step used to recover equilibrium. x(0) Severe: No step, OR would fall if not caught, OR falls spontaneously. 5. COMPENSATORY STEPPING CORRECTION- BACKWARD Instruction: "Stand with your feet shoulder width apart, arms at your sides. Lean backward against my hands beyond your backward limits. When I let go, do whatever is necessary, including taking a step, to avoid a fall." (2) Normal: Recovers independently with a single, large step. (1) Moderate: More than one step used to recover equilibrium. x(0) Severe: No step, OR would fall if not caught, OR falls spontaneously. 6. COMPENSATORY STEPPING CORRECTION- LATERAL Instruction: "Stand with your feet together, arms down at your sides. Lean into my hand beyond your sideways limit. When I let go, do whatever is necessary, including taking a step, to avoid a fall." Left (2) Normal: Recovers independently with 1 step (crossover or lateral OK). (1) Moderate: Several steps to recover equilibrium. x(0) Severe: Falls, or cannot step. Right (2) Normal: Recovers independently with 1 step (crossover or lateral OK). (1) Moderate: Several steps to recover equilibrium. x(0) Severe: Falls, or cannot step. Use the side with the lowest  score to calculate sub-score and total score. ____________________________________________________________________________________Sensory Orientation_____________Subscore:______5___/6 7. STANCE (FEET TOGETHER); EYES OPEN, FIRM SURFACE Instruction: "Place your hands on your hips. Place your feet together until almost touching. Look straight ahead. Be as stable and still as possible, until I say stop." Time in seconds:________ x(2) Normal: 30 s. (1) Moderate: < 30 s. (0) Severe: Unable. 8. STANCE (FEET TOGETHER); EYES CLOSED, FOAM SURFACE Instruction: "Step onto the foam. Place your hands on your hips. Place your feet together until almost touching. Be as stable and still as possible, until I say stop. I will start timing when you close your eyes." Time in seconds:________ (2) Normal: 30 s. x(1) Moderate: < 30 s. (0) Severe: Unable. 9. INCLINE- EYES CLOSED Instruction: "Step onto the incline ramp. Please stand on the incline ramp with your toes toward the top. Place your feet shoulder width apart and have your arms down at your sides. I will start timing when you close your eyes." Time in seconds:________ x(2) Normal: Stands independently 30 s  and aligns with gravity. (1) Moderate: Stands independently <30 s OR aligns with surface. (0) Severe: Unable. _________________________________________________________________________________________Dynamic Gait ______Subscore____6____/10 10. CHANGE IN GAIT SPEED Instruction: "Begin walking at your normal speed, when I tell you 'fast', walk as fast as you can. When I say 'slow', walk very slowly." (2) Normal: Significantly changes walking speed without imbalance. x(1) Moderate: Unable to change walking speed or signs of imbalance. (0) Severe: Unable to achieve significant change in walking speed AND signs of imbalance. [place] - HORIZONTAL Instruction: "Begin walking at your normal speed, when I say "right", turn your head and look  to the right. When I say "left" turn your head and look to the left. Try to keep yourself walking in a straight line." x(2) Normal: performs head turns with no change in gait speed and good balance. (1) Moderate: performs head turns with reduction in gait speed. (0) Severe: performs head turns with imbalance. 12. WALK WITH PIVOT TURNS Instruction: "Begin walking at your normal speed. When I tell you to 'turn and stop', turn as quickly as you can, face the opposite direction, and stop. After the turn, your feet should be close together." (2) Normal: Turns with feet close FAST (< 3 steps) with good balance. (x1) Moderate: Turns with feet close SLOW (>4 steps) with good balance. (0) Severe: Cannot turn with feet close at any speed without imbalance. 13. STEP OVER OBSTACLES Instruction: "Begin walking at your normal speed. When you get to the box, step over it, not around it and keep walking." x(2) Normal: Able to step over box with minimal change of gait speed and with good balance. (1) Moderate: Steps over box but touches box OR displays cautious behavior by slowing gait. (0) Severe: Unable to step over box OR steps around box. 14. TIMED UP & GO WITH DUAL TASK [3 METER WALK] Instruction TUG: "When I say 'Go', stand up from chair, walk at your normal speed across the tape on the floor, turn around, and come back to sit in the chair." Instruction TUG with Dual Task: "Count backwards by threes starting at ___. When I say 'Go', stand up from chair, walk at your normal speed across the tape on the floor, turn around, and come back to sit in the chair. Continue counting backwards the entire time." TUG: _12.44_______seconds; Dual Task TUG: ___15.69_____seconds (2) Normal: No noticeable change in sitting, standing or walking while backward counting when compared to TUG without Dual Task. (1) Moderate: Dual Task affects either counting OR walking (>10%) when compared to the TUG without Dual Task. x(0)  Severe: Stops counting while walking OR stops walking while counting. When scoring item 14, if subject's gait speed slows more than 10% between the TUG without and with a Dual Task the score should be decreased by a point. TOTAL SCORE: ____15____/28                    PT Education - 12/19/17 0849    Education Details  results of assessment of STGs    Person(s) Educated  Patient    Methods  Explanation    Comprehension  Verbalized understanding       PT Short Term Goals - 12/19/17 0850      PT SHORT TERM GOAL #1   Title  Pt will be independent with HEP to target Parkinson's specific deficits.  TARGET for all STGS 12/22/17    Time  4    Period  Weeks  Status  Achieved      PT SHORT TERM GOAL #2   Title  Pt will improve TUG score to less than or equal to 13.5 seconds for decreased fall risk.    Baseline  12/19/17  12.44 sec    Time  4    Period  Weeks    Status  Achieved      PT SHORT TERM GOAL #3   Title  Pt will perform sit<>stand transfers at least 8 of 10 reps with no hesitation and posterior lean.    Baseline  12/19/17    Time  4    Period  Weeks    Status  Achieved      PT SHORT TERM GOAL #4   Title  Pt will improve MiniBESTest score to at least 15/28 for decreased fall risk.    Baseline  12/19/17  15/28    Time  4    Period  Weeks    Status  Achieved      PT SHORT TERM GOAL #5   Title  Pt will improve ABC scale score by at least 10% to demonstrate improved balance confidence with daily activities.    Baseline  Eval:  57.5%  12/19/17  57.5%    Time  4    Period  Weeks    Status  Not Met        PT Long Term Goals - 11/30/17 1210      PT LONG TERM GOAL #1   Title  Pt will verbalize understanding of fall prevention/tips to reduce freezing with gait.  UPDATED TARGET for all LTGs 01/19/18    Time  8    Period  Weeks    Status  New      PT LONG TERM GOAL #2   Title  Pt will improve TUG cognitive to less than or equal to 15 seconds for decreased  fall risk/improved dual tasking with gait.    Time  8    Period  Weeks    Status  New      PT LONG TERM GOAL #3   Title  Pt will improve MiniBESTest score to at least 19/28 for decreased fall risk.    Time  8    Period  Weeks    Status  New      PT LONG TERM GOAL #4   Title  Pt will improve gait velocity to at least 2 ft/sec for improved gait efficiency and safety in community    Time  8    Period  Weeks    Status  New      PT LONG TERM GOAL #5   Title  Pt will ambulate at least 1000 ft, indoor and outdoor surfaces, including ramps and inclines, with least restrictive assistive device modified independently for improved safety with outdoor gait.    Time  8    Period  Weeks    Status  New            Plan - 12/19/17 1257    Clinical Impression Statement  Patient's progress towards STGs assessed with pt meeting 4 of 5 goals and did not meet one goal of 5. His unmet goal was related to the Activities-Specific Balance Confidence scale (ABC) where he scored the exact same as he did on evaluation (57.5%). Comparing initial survey of 4 weeks prior to today's, in some areas it appears patient may have better awareness of his limitations (decreased confidence in standing on a  chair to reach an object) in other areas his awareness seemed worse (walking on an icy sidewalk confidence improved by 30%). Overall it appears his confidence to complete outdoor tasks has decreased while his confidence in indoor tasks has increased. Discussed results with patient and the fact he had improved in all areas except for his confidence. Patient can continue to benefit from PT to work towards [place] Potential  Good    Clinical Impairments Affecting Rehab Potential  Memory deficits per speech eval notes    PT Frequency  2x / week    PT Duration  8 weeks plus eval; recert 0/14/99 to cover POC    PT Treatment/Interventions  ADLs/Self Care Home Management;DME Instruction;Gait training;Stair  training;Functional mobility training;Therapeutic activities;Therapeutic exercise;Balance training;Neuromuscular re-education;Patient/family education    PT Next Visit Plan  ? Sci-Fit activity initially for warm-up (he enjoys); he wants to practice floor transfers again per 3/19 discussion; only exercises Jeani Hawking removed from his notebook approp to b stepping strategy work, sit<>stand, gait activities for foot clearance; work on floor to stand transfers per patient request Check STGs next visit    Consulted and Agree with Plan of Care  Patient       Patient will benefit from skilled therapeutic intervention in order to improve the following deficits and impairments:  Abnormal gait, Decreased balance, Decreased mobility, Difficulty walking, Postural dysfunction  Visit Diagnosis: Other symptoms and signs involving the nervous system  Unsteadiness on feet  Other abnormalities of gait and mobility     Problem List Patient Active Problem List   Diagnosis Date Noted  . TMJ (temporomandibular joint syndrome) 10/10/2017  . Parkinson's disease ([place]) 03/20/2017  . Bilateral lower extremity edema 03/20/2017  . Chronic venous insufficiency 09/13/2016  . Fatigue 03/15/2016  . Parkinsonian features 09/15/2015  . ED (erectile dysfunction) 09/15/2015  . MDD (major depressive disorder), recurrent episode, severe ([place]) 02/10/2014  . Nonspecific abnormal electrocardiogram (ECG) (EKG) 02/07/2014  . Non-compliant behavior 02/03/2014  . Morton's neuroma of left foot 08/07/2013  . 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 Hyperglycemia 01/26/2012  . Hypogonadism male 01/24/2012  . Syncope and collapse 01/20/2012  .  OSA (obstructive sleep apnea) 01/17/2011  . CHEST TIGHTNESS 02/18/2008    Rexanne Mano, PT 12/19/2017, 4:12 PM  [facility] [address] [place], [place], [address] Phone: [phone]   Fax:   [phone]  Name: JAFARI MCKILLOP MRN: [id number] Date of Birth: [birth year]

## 2017-12-19 NOTE — Therapy (Signed)
[facility] [address] [place], [place], [address] Phone: [phone]   Fax:  [phone]  Speech Language Pathology Treatment  Patient Details  Name: [name] [name] MRN: [id number] Date of Birth: Jan 31, 1951 Referring Provider: Alonza Bogus, DO   Encounter Date: 12/19/2017  End of Session - 12/19/17 1016    Visit Number  7    Number of Visits  17    Date for SLP Re-Evaluation  01/19/18    SLP Start Time  0934    SLP Stop Time   1017    SLP Time Calculation (min)  43 min    Activity Tolerance  Patient tolerated treatment well       Past Medical History:  Diagnosis Date  .  OSA (obstructive sleep apnea) 01/17/2011   npsg 2012:  AHI 67/hr. Auto titration 2012:  Optimal pressure 12cm.   . APPENDECTOMY, HX OF 02/18/2008   Qualifier: Diagnosis of  By: [place], [place]    . Cardiac murmur    as a child  . Depression   . Headache(784.0)   . HEADACHES, HX OF 02/18/2008   Qualifier: Diagnosis of  By: Danny Lawless CMA, [place]    . Parkinson disease ([place]) 11/2014  . Streptococcal meningitis    as an infant    Past Surgical History:  Procedure Laterality Date  . APPENDECTOMY  1967  . NASAL SINUS SURGERY     x 4 as a child  . VASECTOMY      There were no vitals filed for this visit.  Subjective Assessment - 12/19/17 0948    Subjective  Pt arrives with upper 60s dB volume.    Currently in Pain?  No/denies            ADULT SLP TREATMENT - 12/19/17 1005      General Information   Behavior/Cognition  Alert;Cooperative;Pleasant mood      Treatment Provided   Treatment provided  Cognitive-Linquistic      Cognitive-Linquistic Treatment   Treatment focused on  Dysarthria    Skilled Treatment  Pt entered room with near WNL speech volume in conversation. SLP recalibrated conversational volume with loud /a/ - pt produced low 90s dB with usual cues for full breath. Conversation after loud /a/ was of upper 60s  lower 70s dB with occasional min A for "strong voice" and "louder voice". Pt completed structured tasks with WNL volume.      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[employer], retail appropriate for recall of 60% of details    Time  1    Period  Weeks    Status  On-going      SLP SHORT TERM GOAL #2   Title  pt will demo attention appropriate for 5 minutes simple conversation with modified independence (compensations)    Time  1  Period  Weeks    Status  On-going      SLP SHORT TERM GOAL #3   Title  Pt will utilize compensations for dysarthria in structured tasks with occasional min A in three sessions    Status  Achieved       SLP Long Term Goals - 12/19/17 1020      SLP LONG TERM GOAL #1   Title  pt will demo attention skills adequate for functional verbal expression in 10 minutes simple-mod complex conversation over two sessions    Time  5    Period  Weeks    Status  On-going      SLP LONG TERM GOAL #2   Title  pt will complete functional working memory tasks with rare min A and compensatory measures    Time  5    Period  Weeks    Status  On-going      SLP LONG TERM GOAL #3   Title  Pt will utilize compensations to be intelligible with cues from spouse at sentence level over 2 sessions per pt/spouse report    Time  5    Period  Weeks    Status  On-going       Plan - 12/19/17 1017    Clinical Impression Statement   Pt continues with  speech conversational volume requiring verbal and visual cues. See "skilled intervention" for details. Continue skilled ST to maximize carryover of compensations for cognition and intelligibility.     Speech Therapy Frequency  2x / week    Duration  -- 8 weeks (possible 4-6 weeks depending on progress) or 16 sessions    Treatment/Interventions  Language facilitation;Internal/external aids;Compensatory techniques;SLP instruction and feedback;Multimodal communcation approach;Cognitive reorganization;Functional tasks;Cueing hierarchy;Patient/family education    Potential to Achieve Goals  Good    Potential Considerations  Severity of impairments    Consulted and Agree with Plan of Care  Patient       Patient will benefit from skilled therapeutic intervention in order to improve the following deficits and impairments:   Aphasia  Cognitive communication deficit  Dysarthria and anarthria    Problem List Patient Active Problem List   Diagnosis Date Noted  . TMJ (temporomandibular joint syndrome) 10/10/2017  . Parkinson's disease ([place]) 03/20/2017  . Bilateral lower extremity edema 03/20/2017  . Chronic venous insufficiency 09/13/2016  . Fatigue 03/15/2016  . Parkinsonian features 09/15/2015  . ED (erectile dysfunction) 09/15/2015  . MDD (major depressive disorder), recurrent episode, severe ([place]) 02/10/2014  . Nonspecific abnormal electrocardiogram (ECG) (EKG) 02/07/2014  . Non-compliant behavior 02/03/2014  . Morton's neuroma of left foot 08/07/2013  . Attention deficit disorder without mention of hyperactivity 03/13/2012  . Hyperglycemia 01/26/2012  . Hypogonadism male 01/24/2012  . Syncope and collapse 01/20/2012  .  OSA (obstructive sleep apnea) 01/17/2011  . CHEST TIGHTNESS 02/18/2008    Latanja Lehenbauer ,MS, CCC-SLP  12/19/2017, 10:21 AM  [facility] [address] [place], [place], [address] Phone: [phone]   Fax:   [phone]   Name: [name] [name] MRN: [id number] Date of Birth: [birth year]

## 2017-12-20 DIAGNOSIS — R351 Nocturia: Secondary | ICD-10-CM | POA: Diagnosis not present

## 2017-12-20 DIAGNOSIS — N3941 Urge incontinence: Secondary | ICD-10-CM | POA: Diagnosis not present

## 2017-12-20 DIAGNOSIS — R3915 Urgency of urination: Secondary | ICD-10-CM | POA: Diagnosis not present

## 2017-12-20 DIAGNOSIS — R350 Frequency of micturition: Secondary | ICD-10-CM | POA: Diagnosis not present

## 2017-12-21 ENCOUNTER — Encounter: Payer: Self-pay | Admitting: Speech Pathology

## 2017-12-21 ENCOUNTER — Encounter: Payer: Self-pay | Admitting: Internal Medicine

## 2017-12-21 ENCOUNTER — Telehealth: Payer: Self-pay | Admitting: Internal Medicine

## 2017-12-21 ENCOUNTER — Encounter: Payer: Self-pay | Admitting: Physical Therapy

## 2017-12-21 ENCOUNTER — Ambulatory Visit: Payer: Medicare Other | Admitting: Occupational Therapy

## 2017-12-21 ENCOUNTER — Ambulatory Visit: Payer: Medicare Other | Admitting: Physical Therapy

## 2017-12-21 DIAGNOSIS — R41841 Cognitive communication deficit: Secondary | ICD-10-CM | POA: Diagnosis not present

## 2017-12-21 DIAGNOSIS — R2689 Other abnormalities of gait and mobility: Secondary | ICD-10-CM

## 2017-12-21 DIAGNOSIS — R293 Abnormal posture: Secondary | ICD-10-CM

## 2017-12-21 DIAGNOSIS — R29898 Other symptoms and signs involving the musculoskeletal system: Secondary | ICD-10-CM | POA: Diagnosis not present

## 2017-12-21 DIAGNOSIS — R278 Other lack of coordination: Secondary | ICD-10-CM

## 2017-12-21 DIAGNOSIS — K625 Hemorrhage of anus and rectum: Secondary | ICD-10-CM

## 2017-12-21 DIAGNOSIS — R2681 Unsteadiness on feet: Secondary | ICD-10-CM | POA: Diagnosis not present

## 2017-12-21 DIAGNOSIS — R29818 Other symptoms and signs involving the nervous system: Secondary | ICD-10-CM

## 2017-12-21 DIAGNOSIS — R41840 Attention and concentration deficit: Secondary | ICD-10-CM

## 2017-12-21 NOTE — Therapy (Signed)
[facility] [address] [place], [place], [address] Phone: [phone]   Fax:  [phone]  Physical Therapy Treatment  Patient Details  Name: [name] [name] MRN: [id number] Date of Birth: 1951/08/17 Referring Provider: Dr. Carles Collet   Encounter Date: 12/21/2017  [name] [name] End of Session - 12/21/17 1459    Visit Number  8    Number of Visits  17    Date for [name] [name] Re-Evaluation  01/29/18    Authorization Type  [employer]-75 visit limit combined [name] [name], OT, speech    Authorization - Visit Number  8    Authorization - Number of Visits  75 75 [name] [name], OT, speech combined    [name] [name] Start Time  1447    [name] [name] Stop Time  1531    [name] [name] Time Calculation (min)  44 min    Equipment Utilized During Treatment  --    Activity Tolerance  Patient tolerated treatment well    Behavior During Therapy  [facility] for tasks assessed/performed       Past Medical History:  Diagnosis Date  .  OSA (obstructive sleep apnea) 01/17/2011   npsg 2012:  AHI 67/hr. Auto titration 2012:  Optimal pressure 12cm.   . APPENDECTOMY, HX OF 02/18/2008   Qualifier: Diagnosis of  By: [place], [place]    . Cardiac murmur    as a child  . Depression   . Headache(784.0)   . HEADACHES, HX OF 02/18/2008   Qualifier: Diagnosis of  By: Danny Lawless CMA, [place]    . Parkinson disease ([place]) 11/2014  . Streptococcal meningitis    as an infant    Past Surgical History:  Procedure Laterality Date  . APPENDECTOMY  1967  . NASAL SINUS SURGERY     x 4 as a child  . VASECTOMY      There were no vitals filed for this visit.  Subjective Assessment - 12/21/17 1457    Subjective  No falls, no changes since last visit. Back is a little sore from standing during OT.     Patient Stated Goals  [name] [name]'s goals for therapy are to be doing exercise and to improve balance.    Currently in Pain?  Yes    Pain Score  2     Pain Location  Back    Pain Orientation  Lower    Pain Descriptors / Indicators  Aching     Pain Type  Chronic pain    Pain Onset  In the past 7 days    Pain Frequency  Intermittent                      [facility] Adult [name] [name] Treatment/Exercise - 12/21/17 1948      Transfers   Sit to Stand  5: Supervision    Floor to Transfer  4: Min guard;5: Supervision    Floor to Transfer Details (indicate cue type and reason)  [name] [name] did not want to practice on red floor mat (texture makes it hard to move his feet with rubber soled shoes) therefore practiced in carpeted area. [name] [name] able to lower down into quadruped with supervision; did not want to fully lower to supine on his back; quadruped to tall kneeling then up to stand with both hands pushing on lead leg      Ambulation/Gait   Ambulation/Gait Assistance  6: Modified independent (Device/Increase time)    Ambulation Distance (Feet)  230 Feet 230, 115    Assistive device  None  Gait Pattern  Step-through pattern;Decreased arm swing - right;Decreased arm swing - left;Decreased step length - right;Decreased step length - left;Decreased trunk rotation;Trunk flexed;Poor foot clearance - left;Poor foot clearance - right    Ambulation Surface  Indoor    Stairs  --    Stairs Assistance  --    Stair Management Technique  --    Number of Stairs  --    Height of Stairs  --    Pre-Gait Activities  at counter, then // bars--stepping over up to 2" beam for improved foot clearance and focus on heelstrike;     Gait Comments  much improved technique when cued to incr velocity (improved step length and arm swing); improved foot clearance after pre-gait activities      Knee/Hip Exercises: Aerobic   Other Aerobic  Sci fit stepper L4.0 x 5 minutes        [facility]) - 12/21/17 1955    PWR! exercises  Moves in standing    PWR! Up  x20 hands on counter during squat; assist for hips shifting post    PWR! Rock  20    PWR! Twist  20            [name] [name] Short Term Goals - 12/19/17 0850      [name] [name] SHORT TERM GOAL #1   Title  [name] [name] will be independent  with HEP to target Parkinson's specific deficits.  TARGET for all STGS 12/22/17    Time  4    Period  Weeks    Status  Achieved      [name] [name] SHORT TERM GOAL #2   Title  [name] [name] will improve TUG score to less than or equal to 13.5 seconds for decreased fall risk.    Baseline  12/19/17  12.44 sec    Time  4    Period  Weeks    Status  Achieved      [name] [name] SHORT TERM GOAL #3   Title  [name] [name] will perform sit<>stand transfers at least 8 of 10 reps with no hesitation and posterior lean.    Baseline  12/19/17    Time  4    Period  Weeks    Status  Achieved      [name] [name] SHORT TERM GOAL #4   Title  [name] [name] will improve MiniBESTest score to at least 15/28 for decreased fall risk.    Baseline  12/19/17  15/28    Time  4    Period  Weeks    Status  Achieved      [name] [name] SHORT TERM GOAL #5   Title  [name] [name] will improve ABC scale score by at least 10% to demonstrate improved balance confidence with daily activities.    Baseline  Eval:  57.5%  12/19/17  57.5%    Time  4    Period  Weeks    Status  Not Met        [name] [name] Long Term Goals - 11/30/17 1210      [name] [name] LONG TERM GOAL #1   Title  [name] [name] will verbalize understanding of fall prevention/tips to reduce freezing with gait.  UPDATED TARGET for all LTGs 01/19/18    Time  8    Period  Weeks    Status  New      [name] [name] LONG TERM GOAL #2   Title  [name] [name] will improve TUG cognitive to less than or equal to 15 seconds for decreased fall risk/improved dual tasking with gait.    Time  8    Period  Weeks    Status  New      [name] [name] LONG TERM GOAL #3   Title  [name] [name] will improve MiniBESTest score to at least 19/28 for decreased fall risk.    Time  8    Period  Weeks    Status  New      [name] [name] LONG TERM GOAL #4   Title  [name] [name] will improve gait velocity to at least 2 ft/sec for improved gait efficiency and safety in community    Time  8    Period  Weeks    Status  New      [name] [name] LONG TERM GOAL #5   Title  [name] [name] will ambulate at least 1000 ft, indoor and outdoor surfaces, including ramps and inclines, with least  restrictive assistive device modified independently for improved safety with outdoor gait.    Time  8    Period  Weeks    Status  New            Plan - 12/21/17 1957    Clinical Impression Statement  Patient responds well to pre-gait activities for improved foot clearance. Coupled with vc for incr velocity, quality of patient's gait improved significantly and able to maintain with min cues for velocity only. Required vc and tactile cues for technique with PWR! standing exercises to acheive full extension of extremities and torso. Initial 5 reps at slow pace for technique and stretching followed by remaining reps at faster pace with emphasis on intensity of movement. Patient can continue to benefit from therapy to achieve LTGs    Rehab Potential  Good    Clinical Impairments Affecting Rehab Potential  Memory deficits per speech eval notes    [name] [name] Frequency  2x / week    [name] [name] Duration  8 weeks plus eval; recert 0/34/74 to cover POC    [name] [name] Treatment/Interventions  ADLs/Self Care Home Management;DME Instruction;Gait training;Stair training;Functional mobility training;Therapeutic activities;Therapeutic exercise;Balance training;Neuromuscular re-education;Patient/family education    [name] [name] Next Visit Plan   Sci-Fit activity initially for warm-up (he enjoys); he wants to practice floor transfers each visit (next time see if he'll get down all the way to supine);  gait activities for foot clearance, step length, arm swing; PWR! standing with intensity    Consulted and Agree with Plan of Care  Patient       Patient will benefit from skilled therapeutic intervention in order to improve the following deficits and impairments:  Abnormal gait, Decreased balance, Decreased mobility, Difficulty walking, Postural dysfunction  Visit Diagnosis: Other symptoms and signs involving the nervous system  Abnormal posture  Other abnormalities of gait and mobility     Problem List Patient Active Problem List    Diagnosis Date Noted  . TMJ (temporomandibular joint syndrome) 10/10/2017  . Parkinson's disease ([place]) 03/20/2017  . Bilateral lower extremity edema 03/20/2017  . Chronic venous insufficiency 09/13/2016  . Fatigue 03/15/2016  . Parkinsonian features 09/15/2015  . ED (erectile dysfunction) 09/15/2015  . MDD (major depressive disorder), recurrent episode, severe ([place]) 02/10/2014  . Nonspecific abnormal electrocardiogram (ECG) (EKG) 02/07/2014  . Non-compliant behavior 02/03/2014  . Morton's neuroma of left foot 08/07/2013  . Attention deficit disorder without mention of hyperactivity 03/13/2012  . Hyperglycemia 01/26/2012  . Hypogonadism male 01/24/2012  . Syncope and collapse 01/20/2012  .  OSA (obstructive sleep apnea) 01/17/2011  . CHEST TIGHTNESS 02/18/2008    [name] [name], [name] [name] 12/21/2017, 8:03 PM  [facility] [address] [place], [place], [address] Phone: [phone]   Fax:  [phone]  Name: [name] [name] MRN: [id number] Date of Birth: March 17, 1951

## 2017-12-21 NOTE — Telephone Encounter (Signed)
Copied from [facility] [id number]. Topic: Referral - Request >> Dec 21, 2017 12:24 PM Valla Leaver wrote: Reason for CRM: Wife [name] [name], requesting urgent GI referral for blood in stool. Patient unable to speak with a nurse and wife declining an appt. Please call her back per her request.

## 2017-12-21 NOTE — Therapy (Signed)
[facility] [address] [place], [place], [address] Phone: [phone]   Fax:  [phone]  Occupational Therapy Treatment  Patient Details  Name: [name] [name] MRN: [id number] Date of Birth: Aug 21, 1951 Referring Provider: Dr. Carles Collet   Encounter Date: 12/21/2017  OT End of Session - 12/21/17 1412    Visit Number  8    Number of Visits  17    Date for OT Re-Evaluation  02/03/18    Authorization Type  As of 12/01/17, Medicare primary/BCBS secondary    Authorization Time Period  75 visit limit combined for [employer]; cert date 11/06/24-05/05/40    Authorization - Visit Number  8    Authorization - Number of Visits  25    OT Start Time  1404    OT Stop Time  1445    OT Time Calculation (min)  41 min       Past Medical History:  Diagnosis Date  .  OSA (obstructive sleep apnea) 01/17/2011   npsg 2012:  AHI 67/hr. Auto titration 2012:  Optimal pressure 12cm.   . APPENDECTOMY, HX OF 02/18/2008   Qualifier: Diagnosis of  By: [place], [place]    . Cardiac murmur    as a child  . Depression   . Headache(784.0)   . HEADACHES, HX OF 02/18/2008   Qualifier: Diagnosis of  By: Danny Lawless CMA, [place]    . Parkinson disease ([place]) 11/2014  . Streptococcal meningitis    as an infant    Past Surgical History:  Procedure Laterality Date  . APPENDECTOMY  1967  . NASAL SINUS SURGERY     x 4 as a child  . VASECTOMY      There were no vitals filed for this visit.  Subjective Assessment - 12/21/17 1511    Pertinent History  Parkinson's disease, seeing illusions, hx of falls, hx of significant depression, mild cognitive impairment, sleep apnea, hypogonadism, ADD    Patient Stated Goals  improved balance and ease with ADLs    Currently in Pain?  Yes    Pain Score  2     Pain Location  Back    Pain Orientation  Lower    Pain Descriptors / Indicators  Aching    Pain Type  Chronic pain    Pain Onset  More than a month ago    Pain  Frequency  Intermittent    Aggravating Factors   standing    Pain Relieving Factors  sitting             Treatment: Arm bike x 6 mins level 1 for conditioning, pt mainainned 38-45 rpm, min v.c Dynamic step and reach(PWR!rock) over target with left then RUE, to copy small peg design on vertical surface for cognitive component, increased time required, min v.c for upright posture, larger movements Seated removing pegs from pegboard with in hand manipulation, min v.c.                 OT Short Term Goals - 12/14/17 9622      OT SHORT TERM GOAL #1   Title  Pt will be independent with PD specific  HEP.--    Time  4    Period  Weeks    Status  On-going      OT SHORT TERM GOAL #2   Title  Pt will verbalize understanding of ways to prevent future complications and current available/appropriate community resources prn.    Time  4  Status  On-going      OT SHORT TERM GOAL #3   Title  Pt will improve bilateral hand coordination as shown by fastening/unfastening 3 buttons in 50sec or less.    Baseline  57.34    Time  4    Period  Weeks    Status  On-going  (Pended)  56.12 sec       OT SHORT TERM GOAL #4   Title  Pt will report incr ease with donning pants socks/shoes using adaptive strategies/AE prn.    Time  4    Status  Achieved        OT Long Term Goals - 11/09/17 1249      OT LONG TERM GOAL #1   Title  Pt will verbalize understanding of adapted strategies for ADLs/IADLs--    Time  8    Period  Weeks    Status  New    Target Date  01/08/18      OT LONG TERM GOAL #2   Title  Pt will demonstrate improved functional standing balance as evidenced by increasing bilateral  standing functional reach to 10 inches or greater    Baseline  RUE 9.5 inches, LUE 8.5 in    Time  8    Period  Weeks    Status  New      OT LONG TERM GOAL #3   Title  Pt will improve bilateral hand coordination as shown by fastening/unfastening 3 buttons in 45sec or less.    Time  8     Period  Weeks    Status  New            Plan - 12/21/17 1505    Clinical Impression Statement  Pt is progressing towards goals. He continues to require v.c for larger amplitude movments withADLS/Functional tasks.    Occupational Profile and client history currently impacting functional performance  PMH that includes: seeing illusions, hx of falls, hx of significant depression, mild cognitive impairment, sleep apnea, hypogonadism, ADD. Pt retired due to difficulty at work and no longer drives. Pt reports slowing of ADLs and incr cognitive deficits impacting function.     Occupational performance deficits (Please refer to evaluation for details):  ADL's;IADL's;Leisure;Social Participation    Rehab Potential  Good    Current Impairments/barriers affecting progress:  cognitive deficits, depression    OT Frequency  2x / week    OT Duration  8 weeks    OT Treatment/Interventions  Self-care/ADL training;Moist Heat;Fluidtherapy;Balance training;Therapeutic activities;Cognitive remediation/compensation;Therapeutic exercise;Ultrasound;Cryotherapy;Neuromuscular education;Visual/perceptual remediation/compensation;Passive range of motion;Functional Mobility Training;Patient/family education;Manual Therapy;Energy conservation;Paraffin    Plan  dynamic step and reach, dual tasking, review coordination activities, caregiver education if pt's wife comes in for therapy    Consulted and Agree with Plan of Care  Patient       Patient will benefit from skilled therapeutic intervention in order to improve the following deficits and impairments:  Decreased cognition, Impaired flexibility, Decreased mobility, Decreased coordination, Decreased endurance, Decreased range of motion, Decreased strength, Impaired UE functional use, Impaired tone, Impaired perceived functional ability, Decreased safety awareness, Difficulty walking, Decreased balance, Decreased activity tolerance, Impaired vision/preception,  Improper spinal/pelvic alignment  Visit Diagnosis: Other symptoms and signs involving the nervous system  Other symptoms and signs involving the musculoskeletal system  Other lack of coordination  Attention and concentration deficit  Abnormal posture    Problem List Patient Active Problem List   Diagnosis Date Noted  . TMJ (temporomandibular joint syndrome) 10/10/2017  . Parkinson's  disease ([place]) 03/20/2017  . Bilateral lower extremity edema 03/20/2017  . Chronic venous insufficiency 09/13/2016  . Fatigue 03/15/2016  . Parkinsonian features 09/15/2015  . ED (erectile dysfunction) 09/15/2015  . MDD (major depressive disorder), recurrent episode, severe ([place]) 02/10/2014  . Nonspecific abnormal electrocardiogram (ECG) (EKG) 02/07/2014  . Non-compliant behavior 02/03/2014  . Morton's neuroma of left foot 08/07/2013  . Attention deficit disorder without mention of hyperactivity 03/13/2012  . Hyperglycemia 01/26/2012  . Hypogonadism male 01/24/2012  . Syncope and collapse 01/20/2012  .  OSA (obstructive sleep apnea) 01/17/2011  . CHEST TIGHTNESS 02/18/2008    RINE,KATHRYN 12/21/2017, 3:11 PM  [facility] [address] [place], [place], [address] Phone: [phone]   Fax:  [phone]  Name: [name] [name] MRN: [id number] Date of Birth: [birth year]

## 2017-12-21 NOTE — Telephone Encounter (Signed)
Routing to [place], please advise in the absence of dr crawford, thanks

## 2017-12-22 NOTE — Addendum Note (Signed)
Addended by: Lance Sell on: 12/22/2017 11:13 AM   Modules accepted: Orders

## 2017-12-22 NOTE — Telephone Encounter (Signed)
I have placed the referral to gastroenterology. I would like to make sure that the patient is stable- can we find out the color of the blood, how frequently is the patient having the blood, and are there any other abnormal symptoms?

## 2017-12-22 NOTE — Telephone Encounter (Signed)
Contacted pt and pt spouse. Spouse informed that she has already spoke to [facility] and was informed to call GI directly. Pt spouse informed that they have an appt with GI in a few weeks.

## 2017-12-26 ENCOUNTER — Ambulatory Visit: Payer: Medicare Other | Admitting: Speech Pathology

## 2017-12-26 ENCOUNTER — Ambulatory Visit: Payer: Medicare Other | Admitting: Occupational Therapy

## 2017-12-26 ENCOUNTER — Ambulatory Visit: Payer: Medicare Other | Admitting: Physical Therapy

## 2017-12-26 ENCOUNTER — Encounter: Payer: Self-pay | Admitting: Physical Therapy

## 2017-12-26 ENCOUNTER — Encounter: Payer: Self-pay | Admitting: Speech Pathology

## 2017-12-26 DIAGNOSIS — R41840 Attention and concentration deficit: Secondary | ICD-10-CM

## 2017-12-26 DIAGNOSIS — R29818 Other symptoms and signs involving the nervous system: Secondary | ICD-10-CM

## 2017-12-26 DIAGNOSIS — R293 Abnormal posture: Secondary | ICD-10-CM | POA: Diagnosis not present

## 2017-12-26 DIAGNOSIS — R29898 Other symptoms and signs involving the musculoskeletal system: Secondary | ICD-10-CM | POA: Diagnosis not present

## 2017-12-26 DIAGNOSIS — R41841 Cognitive communication deficit: Secondary | ICD-10-CM | POA: Diagnosis not present

## 2017-12-26 DIAGNOSIS — R2681 Unsteadiness on feet: Secondary | ICD-10-CM

## 2017-12-26 DIAGNOSIS — R2689 Other abnormalities of gait and mobility: Secondary | ICD-10-CM

## 2017-12-26 DIAGNOSIS — R278 Other lack of coordination: Secondary | ICD-10-CM

## 2017-12-26 DIAGNOSIS — R471 Dysarthria and anarthria: Secondary | ICD-10-CM

## 2017-12-26 NOTE — Therapy (Signed)
[facility] [address] [place], [place], [address] Phone: [phone]   Fax:  [phone]  Occupational Therapy Treatment  Patient Details  Name: [name] [name] MRN: [id number] Date of Birth: [birth year] Referring Provider: Dr. Carles Collet   Encounter Date: 12/26/2017  OT End of Session - 12/26/17 0959    Visit Number  9    Number of Visits  17    Date for OT Re-Evaluation  02/03/18    Authorization Type  As of 12/01/17, Medicare primary/BCBS secondary    Authorization Time Period  75 visit limit combined for BCBS; cert date 02/07/31-2/0/25    Authorization - Visit Number  9    Authorization - Number of Visits  25    Activity Tolerance  Patient tolerated treatment well    Behavior During Therapy  [facility] for tasks assessed/performed       Past Medical History:  Diagnosis Date  .  OSA (obstructive sleep apnea) 01/17/2011   npsg 2012:  AHI 67/hr. Auto titration 2012:  Optimal pressure 12cm.   . APPENDECTOMY, HX OF 02/18/2008   Qualifier: Diagnosis of  By: [place], [place]    . Cardiac murmur    as a child  . Depression   . Headache(784.0)   . HEADACHES, HX OF 02/18/2008   Qualifier: Diagnosis of  By: Danny Lawless CMA, [place]    . Parkinson disease ([place]) 11/2014  . Streptococcal meningitis    as an infant    Past Surgical History:  Procedure Laterality Date  . APPENDECTOMY  1967  . NASAL SINUS SURGERY     x 4 as a child  . VASECTOMY      There were no vitals filed for this visit.  Subjective Assessment - 12/26/17 1458    Subjective   Denies pain    Patient Stated Goals  improved balance and ease with ADLs    Currently in Pain?  No/denies           Treatment: Arm bike x 6 mins level 1 for conditioning,  Pt maintained 40 rpm. Seated at tabletop reviewed PWR! Hands, followed by review of coordination activities for flipping and dealing cards, stacking/ manipulating coins, and rotating ball, min v.c for  performance and larger amplitude movements. Pt donned jacket with improved performance today using bigger movements.                 OT Short Term Goals - 12/26/17 0959      OT SHORT TERM GOAL #1   Title  Pt will be independent with PD specific  HEP.--    Time  4    Period  Weeks    Status  Achieved      OT SHORT TERM GOAL #2   Title  Pt will verbalize understanding of ways to prevent future complications and current available/appropriate community resources prn.    Time  4    Status  Achieved      OT SHORT TERM GOAL #3   Title  Pt will improve bilateral hand coordination as shown by fastening/unfastening 3 buttons in 50sec or less.    Baseline  57.34    Time  4    Period  Weeks    Status  On-going 56.12 sec       OT SHORT TERM GOAL #4   Title  Pt will report incr ease with donning pants socks/shoes using adaptive strategies/AE prn.    Time  4    Status  Achieved      OT SHORT TERM GOAL #5   Title  Pt will demonstrate ability to write a 3 sentences with 100% legibility and only min decrease in letter size.    Status  Achieved        OT Long Term Goals - 11/09/17 1249      OT LONG TERM GOAL #1   Title  Pt will verbalize understanding of adapted strategies for ADLs/IADLs--    Time  8    Period  Weeks    Status  New    Target Date  01/08/18      OT LONG TERM GOAL #2   Title  Pt will demonstrate improved functional standing balance as evidenced by increasing bilateral  standing functional reach to 10 inches or greater    Baseline  RUE 9.5 inches, LUE 8.5 in    Time  8    Period  Weeks    Status  New      OT LONG TERM GOAL #3   Title  Pt will improve bilateral hand coordination as shown by fastening/unfastening 3 buttons in 45sec or less.    Time  8    Period  Weeks    Status  New            Plan - 12/26/17 1456    Clinical Impression Statement  Pt is progressing towards goals. He continues to require v.c for larger amplitude movments with  coordination activities.    OT Treatment/Interventions  Self-care/ADL training;Moist Heat;Fluidtherapy;Balance training;Therapeutic activities;Cognitive remediation/compensation;Therapeutic exercise;Ultrasound;Cryotherapy;Neuromuscular education;Visual/perceptual remediation/compensation;Passive range of motion;Functional Mobility Training;Patient/family education;Manual Therapy;Energy conservation;Paraffin    Plan  Review adapted strategies for ADLS, check goals, d/c next week, screens in 6 mons    Consulted and Agree with Plan of Care  Patient       Patient will benefit from skilled therapeutic intervention in order to improve the following deficits and impairments:  Decreased cognition, Impaired flexibility, Decreased mobility, Decreased coordination, Decreased endurance, Decreased range of motion, Decreased strength, Impaired UE functional use, Impaired tone, Impaired perceived functional ability, Decreased safety awareness, Difficulty walking, Decreased balance, Decreased activity tolerance, Impaired vision/preception, Improper spinal/pelvic alignment  Visit Diagnosis: Other symptoms and signs involving the nervous system  Other symptoms and signs involving the musculoskeletal system  Other lack of coordination  Attention and concentration deficit    Problem List Patient Active Problem List   Diagnosis Date Noted  . TMJ (temporomandibular joint syndrome) 10/10/2017  . Parkinson's disease ([place]) 03/20/2017  . Bilateral lower extremity edema 03/20/2017  . Chronic venous insufficiency 09/13/2016  . Fatigue 03/15/2016  . Parkinsonian features 09/15/2015  . ED (erectile dysfunction) 09/15/2015  . MDD (major depressive disorder), recurrent episode, severe ([place]) 02/10/2014  . Nonspecific abnormal electrocardiogram (ECG) (EKG) 02/07/2014  . Non-compliant behavior 02/03/2014  . Morton's neuroma of left foot 08/07/2013  . Attention deficit disorder without mention of hyperactivity  03/13/2012  . Hyperglycemia 01/26/2012  . Hypogonadism male 01/24/2012  . Syncope and collapse 01/20/2012  .  OSA (obstructive sleep apnea) 01/17/2011  . CHEST TIGHTNESS 02/18/2008    Ebony Yorio 12/26/2017, 2:58 PM Theone Murdoch, OTR/L [phone] Phone: [phone] 3:06 PM 03/26/19[facility] [address] [place], [place], [address] Phone: [phone]   Fax:  [phone]  Name: ADYN SERNA MRN: [id number] Date of Birth: Feb 22, 1951

## 2017-12-26 NOTE — Therapy (Signed)
[facility] [address] [facility] [place], [place], [address] Phone: [phone]   Fax:  [phone]  Speech Language Pathology Treatment  Patient Details  Name: [name] [name] MRN: [id number] Date of Birth: 03/07/1951 Referring Provider: Alonza Bogus, DO   Encounter Date: 12/26/2017  End of Session - 12/26/17 1229    Visit Number  8    Number of Visits  17    Date for SLP Re-Evaluation  01/19/18    Authorization Type  75 total visits    Authorization - Visit Number  12    Authorization - Number of Visits  25    SLP Start Time  0847    SLP Stop Time   0932    SLP Time Calculation (min)  45 min    Activity Tolerance  Patient tolerated treatment well       Past Medical History:  Diagnosis Date  .  OSA (obstructive sleep apnea) 01/17/2011   npsg 2012:  AHI 67/hr. Auto titration 2012:  Optimal pressure 12cm.   . APPENDECTOMY, HX OF 02/18/2008   Qualifier: Diagnosis of  By: [place], [place]    . Cardiac murmur    as a child  . Depression   . Headache(784.0)   . HEADACHES, HX OF 02/18/2008   Qualifier: Diagnosis of  By: Danny Lawless CMA, [place]    . Parkinson disease ([place]) 11/2014  . Streptococcal meningitis    as an infant    Past Surgical History:  Procedure Laterality Date  . APPENDECTOMY  1967  . NASAL SINUS SURGERY     x 4 as a child  . VASECTOMY      There were no vitals filed for this visit.  Subjective Assessment - 12/26/17 0850    Subjective  "It's going - don't ask me what we talked about last time, I won't remember"    Currently in Pain?  No/denies            ADULT SLP TREATMENT - 12/26/17 0852      General Information   Behavior/Cognition  Alert;Cooperative;Pleasant mood      Treatment Provided   Treatment provided  Cognitive-Linquistic      Cognitive-Linquistic Treatment   Treatment focused on  Dysarthria    Skilled Treatment  Loud /a/ average 90dB (85 to 95dB) with occasional min A  for breath support.  Structured speech task with mildly complex naming and spontaneous conversation required occasional mod A for volume. Average conversational volume 68dB.       Assessment / Recommendations / Plan   Plan  Continue with current plan of care      Progression Toward Goals   Progression toward goals  Progressing toward goals         SLP Short Term Goals - 12/26/17 1227      SLP SHORT TERM GOAL #1   Title  pt will demo working Buyer, retail appropriate for recall of 60% of details    Time  1    Period  Weeks    Status  On-going      SLP SHORT TERM GOAL #2   Title  pt will demo attention appropriate for 5 minutes simple conversation with modified independence (compensations)    Baseline  12/26/17    Time  1    Period  Weeks    Status  On-going      SLP SHORT TERM GOAL #3   Title  Pt will utilize compensations for dysarthria in  structured tasks with occasional min A in three sessions    Baseline  12/12/17, 12-14-17, 12/26/17    Status  Achieved       SLP Long Term Goals - 12/26/17 1229      SLP LONG TERM GOAL #1   Title  pt will demo attention skills adequate for functional verbal expression in 10 minutes simple-mod complex conversation over two sessions    Time  4    Period  Weeks    Status  On-going      SLP LONG TERM GOAL #2   Title  pt will complete functional working memory tasks with rare min A and compensatory measures    Time  4    Period  Weeks    Status  On-going      SLP LONG TERM GOAL #3   Title  Pt will utilize compensations to be intelligible with cues from spouse at sentence level over 2 sessions per pt/spouse report    Time  4    Period  Weeks    Status  On-going       Plan - 12/26/17 1227    Clinical Impression Statement   Pt continues with speech conversational volume requiring verbal and visual cues. See "skilled intervention" for details. Continue skilled ST to maximize carryover of compensations for cognition and intelligibility.      Speech Therapy Frequency  2x / week    Treatment/Interventions  Language facilitation;Internal/external aids;Compensatory techniques;SLP instruction and feedback;Multimodal communcation approach;Cognitive reorganization;Functional tasks;Cueing hierarchy;Patient/family education    Potential to Achieve Goals  Good    Potential Considerations  Severity of impairments    Consulted and Agree with Plan of Care  Patient       Patient will benefit from skilled therapeutic intervention in order to improve the following deficits and impairments:   Dysarthria and anarthria  Cognitive communication deficit    Problem List Patient Active Problem List   Diagnosis Date Noted  . TMJ (temporomandibular joint syndrome) 10/10/2017  . Parkinson's disease ([place]) 03/20/2017  . Bilateral lower extremity edema 03/20/2017  . Chronic venous insufficiency 09/13/2016  . Fatigue 03/15/2016  . Parkinsonian features 09/15/2015  . ED (erectile dysfunction) 09/15/2015  . MDD (major depressive disorder), recurrent episode, severe ([place]) 02/10/2014  . Nonspecific abnormal electrocardiogram (ECG) (EKG) 02/07/2014  . Non-compliant behavior 02/03/2014  . Morton's neuroma of left foot 08/07/2013  . Attention deficit disorder without mention of hyperactivity 03/13/2012  . Hyperglycemia 01/26/2012  . Hypogonadism male 01/24/2012  . Syncope and collapse 01/20/2012  .  OSA (obstructive sleep apnea) 01/17/2011  . CHEST TIGHTNESS 02/18/2008    Lovvorn, Annye Rusk MS, CCC-SLP 12/26/2017, 12:30 PM  [facility] [address] [place], [place], [address] Phone: [phone]   Fax:  [phone]   Name: [name] [name] MRN: [id number] Date of Birth: 16-Dec-1950

## 2017-12-26 NOTE — Therapy (Signed)
[facility] [address] [place], [place], [address] Phone: [phone]   Fax:  [phone]  Physical Therapy Treatment  Patient Details  Name: [name] [name] MRN: [id number] Date of Birth: 10/25/1950 Referring Provider: Dr. Carles Collet   Encounter Date: 12/26/2017  PT End of Session - 12/26/17 1311    Visit Number  9    Number of Visits  17    Date for PT Re-Evaluation  01/29/18    Authorization Type  [employer]-75 visit limit combined PT, OT, speech    Authorization - Visit Number  8    Authorization - Number of Visits  75 75 PT, OT, speech combined    PT Start Time  0804    PT Stop Time  0847    PT Time Calculation (min)  43 min    Activity Tolerance  Patient tolerated treatment well    Behavior During Therapy  [facility] for tasks assessed/performed       Past Medical History:  Diagnosis Date  .  OSA (obstructive sleep apnea) 01/17/2011   npsg 2012:  AHI 67/hr. Auto titration 2012:  Optimal pressure 12cm.   . APPENDECTOMY, HX OF 02/18/2008   Qualifier: Diagnosis of  By: [place], [place]    . Cardiac murmur    as a child  . Depression   . Headache(784.0)   . HEADACHES, HX OF 02/18/2008   Qualifier: Diagnosis of  By: Danny Lawless CMA, [place]    . Parkinson disease ([place]) 11/2014  . Streptococcal meningitis    as an infant    Past Surgical History:  Procedure Laterality Date  . APPENDECTOMY  1967  . NASAL SINUS SURGERY     x 4 as a child  . VASECTOMY      There were no vitals filed for this visit.  Subjective Assessment - 12/26/17 0822    Subjective  No falls, no changes since last visit.    Patient Stated Goals  Pt's goals for therapy are to be doing exercise and to improve balance.    Currently in Pain?  No/denies    Pain Onset  In the past 7 days                No data recorded       [facility] Adult PT Treatment/Exercise - 12/26/17 0001      Ambulation/Gait   Ambulation/Gait  Yes    Ambulation/Gait Assistance  6: Modified independent (Device/Increase time)    Ambulation Distance (Feet)  600 Feet then 500 ft    Assistive device  None    Gait Pattern  Step-through pattern;Decreased arm swing - right;Decreased arm swing - left;Decreased step length - right;Decreased step length - left;Decreased trunk rotation;Trunk flexed;Poor foot clearance - left;Poor foot clearance - right    Ambulation Surface  Indoor    Pre-Gait Activities  Pt c/o (and PT notes) increased difficulty with transition movements, including turns and initiating gait; used verbal and tactile cues for lateral weightshift as well as marching in place to initiate gait and turns    Gait Comments  Cues provided for increased step length, with pt improving reciprocal arm swing      Knee/Hip Exercises: Aerobic   Other Aerobic  Sci fit stepper L4.0 x 8 minutes, with added cognitive task, cues to keep RPM >70 for increased intensity as warm up at initiation of PT session.        [facility]) - 12/26/17 3785    PWR!  exercises  Moves in standing;Functional moves    PWR! Up  x 20     PWR! Rock  x 20    PWR! Twist  x 20    PWR Step  x 20 side, then forward step and weightshift, cues for foot clearance, technique and amplitude    Comments  PWR! Moves in standing, with pt wanting to perform "lunge" versus step and weightshift forward, with cues provided for technique and for rationale for forward step and weightshift as part of balance recovery    PWR! Sit to Stand  2 sets x 5 reps with PWR! UP posture upon standing            PT Short Term Goals - 12/19/17 0850      PT SHORT TERM GOAL #1   Title  Pt will be independent with HEP to target Parkinson's specific deficits.  TARGET for all STGS 12/22/17    Time  4    Period  Weeks    Status  Achieved      PT SHORT TERM GOAL #2   Title  Pt will improve TUG score to less than or equal to 13.5 seconds for decreased fall risk.    Baseline  12/19/17  12.44 sec    Time   4    Period  Weeks    Status  Achieved      PT SHORT TERM GOAL #3   Title  Pt will perform sit<>stand transfers at least 8 of 10 reps with no hesitation and posterior lean.    Baseline  12/19/17    Time  4    Period  Weeks    Status  Achieved      PT SHORT TERM GOAL #4   Title  Pt will improve MiniBESTest score to at least 15/28 for decreased fall risk.    Baseline  12/19/17  15/28    Time  4    Period  Weeks    Status  Achieved      PT SHORT TERM GOAL #5   Title  Pt will improve ABC scale score by at least 10% to demonstrate improved balance confidence with daily activities.    Baseline  Eval:  57.5%  12/19/17  57.5%    Time  4    Period  Weeks    Status  Not Met        PT Long Term Goals - 11/30/17 1210      PT LONG TERM GOAL #1   Title  Pt will verbalize understanding of fall prevention/tips to reduce freezing with gait.  UPDATED TARGET for all LTGs 01/19/18    Time  8    Period  Weeks    Status  New      PT LONG TERM GOAL #2   Title  Pt will improve TUG cognitive to less than or equal to 15 seconds for decreased fall risk/improved dual tasking with gait.    Time  8    Period  Weeks    Status  New      PT LONG TERM GOAL #3   Title  Pt will improve MiniBESTest score to at least 19/28 for decreased fall risk.    Time  8    Period  Weeks    Status  New      PT LONG TERM GOAL #4   Title  Pt will improve gait velocity to at least 2 ft/sec for improved gait efficiency and safety in  community    Time  8    Period  Weeks    Status  New      PT LONG TERM GOAL #5   Title  Pt will ambulate at least 1000 ft, indoor and outdoor surfaces, including ramps and inclines, with least restrictive assistive device modified independently for improved safety with outdoor gait.    Time  8    Period  Weeks    Status  New            Plan - 12/26/17 1311    Clinical Impression Statement  Pt does well with gait initially following SciFit aerobic activity with increased  intensity at beginning of session.  Pt seems to respond well to cues for increased step length, which helps reciprocal arm swing be more natural.  Pt with reported (and noted during session today) difficulty with festination with transition movements, with verbal and tactile cues provided to re-initiate gait and turns.  Pt will continue to benefit from skilled PT to address balance, posture, gait and transfers.    Rehab Potential  Good    Clinical Impairments Affecting Rehab Potential  Memory deficits per speech eval notes    PT Frequency  2x / week    PT Duration  8 weeks plus eval; recert 0/94/70 to cover POC    PT Treatment/Interventions  ADLs/Self Care Home Management;DME Instruction;Gait training;Stair training;Functional mobility training;Therapeutic activities;Therapeutic exercise;Balance training;Neuromuscular re-education;Patient/family education    PT Next Visit Plan   Sci-Fit activity initially for warm-up (he enjoys); he wants to practice floor transfers each visit (next time see if he'll get down all the way to supine);  gait activities for foot clearance, step length, arm swing; PWR! standing with intensity Try to discuss PWR! Moves exercise classes    Consulted and Agree with Plan of Care  Patient       Patient will benefit from skilled therapeutic intervention in order to improve the following deficits and impairments:  Abnormal gait, Decreased balance, Decreased mobility, Difficulty walking, Postural dysfunction  Visit Diagnosis: Other symptoms and signs involving the nervous system  Other abnormalities of gait and mobility  Unsteadiness on feet     Problem List Patient Active Problem List   Diagnosis Date Noted  . TMJ (temporomandibular joint syndrome) 10/10/2017  . Parkinson's disease ([place]) 03/20/2017  . Bilateral lower extremity edema 03/20/2017  . Chronic venous insufficiency 09/13/2016  . Fatigue 03/15/2016  . Parkinsonian features 09/15/2015  . ED (erectile  dysfunction) 09/15/2015  . MDD (major depressive disorder), recurrent episode, severe ([place]) 02/10/2014  . Nonspecific abnormal electrocardiogram (ECG) (EKG) 02/07/2014  . Non-compliant behavior 02/03/2014  . Morton's neuroma of left foot 08/07/2013  . Attention deficit disorder without mention of hyperactivity 03/13/2012  . Hyperglycemia 01/26/2012  . Hypogonadism male 01/24/2012  . Syncope and collapse 01/20/2012  .  OSA (obstructive sleep apnea) 01/17/2011  . CHEST TIGHTNESS 02/18/2008    Graig Hessling W. 12/26/2017, 1:16 PM  Frazier Butt., PT   [facility] [address] [place], [place], [address] Phone: [phone]   Fax:  [phone]  Name: [name] [name] MRN: [id number] Date of Birth: [birth year]

## 2017-12-28 ENCOUNTER — Encounter: Payer: Self-pay | Admitting: Speech Pathology

## 2017-12-28 ENCOUNTER — Ambulatory Visit: Payer: Medicare Other | Admitting: Speech Pathology

## 2017-12-28 ENCOUNTER — Encounter: Payer: Self-pay | Admitting: Physical Therapy

## 2017-12-28 ENCOUNTER — Ambulatory Visit: Payer: Medicare Other | Admitting: Physical Therapy

## 2017-12-28 ENCOUNTER — Ambulatory Visit: Payer: Medicare Other | Admitting: Occupational Therapy

## 2017-12-28 DIAGNOSIS — R29898 Other symptoms and signs involving the musculoskeletal system: Secondary | ICD-10-CM

## 2017-12-28 DIAGNOSIS — R41841 Cognitive communication deficit: Secondary | ICD-10-CM | POA: Diagnosis not present

## 2017-12-28 DIAGNOSIS — R29818 Other symptoms and signs involving the nervous system: Secondary | ICD-10-CM

## 2017-12-28 DIAGNOSIS — R2689 Other abnormalities of gait and mobility: Secondary | ICD-10-CM

## 2017-12-28 DIAGNOSIS — R2681 Unsteadiness on feet: Secondary | ICD-10-CM

## 2017-12-28 DIAGNOSIS — R293 Abnormal posture: Secondary | ICD-10-CM

## 2017-12-28 DIAGNOSIS — R41840 Attention and concentration deficit: Secondary | ICD-10-CM

## 2017-12-28 DIAGNOSIS — R471 Dysarthria and anarthria: Secondary | ICD-10-CM

## 2017-12-28 DIAGNOSIS — R278 Other lack of coordination: Secondary | ICD-10-CM

## 2017-12-28 NOTE — Therapy (Signed)
[facility] [address] [place], [place], [address] Phone: [phone]   Fax:  [phone]  Speech Language Pathology Treatment  Patient Details  Name: [name] [name] MRN: [id number] Date of Birth: 12-19-1950 Referring Provider: Alonza Bogus, DO   Encounter Date: 12/28/2017  End of Session - 12/28/17 1520    Visit Number  9    Number of Visits  17    Date for SLP Re-Evaluation  01/19/18    Authorization - Visit Number  62    Authorization - Number of Visits  25    SLP Start Time  0846    SLP Stop Time   0930    SLP Time Calculation (min)  44 min    Activity Tolerance  Patient tolerated treatment well       Past Medical History:  Diagnosis Date  .  OSA (obstructive sleep apnea) 01/17/2011   npsg 2012:  AHI 67/hr. Auto titration 2012:  Optimal pressure 12cm.   . APPENDECTOMY, HX OF 02/18/2008   Qualifier: Diagnosis of  By: [place], [place]    . Cardiac murmur    as a child  . Depression   . Headache(784.0)   . HEADACHES, HX OF 02/18/2008   Qualifier: Diagnosis of  By: Danny Lawless CMA, [place]    . Parkinson disease ([place]) 11/2014  . Streptococcal meningitis    as an infant    Past Surgical History:  Procedure Laterality Date  . APPENDECTOMY  1967  . NASAL SINUS SURGERY     x 4 as a child  . VASECTOMY      There were no vitals filed for this visit.  Subjective Assessment - 12/28/17 0853    Subjective  "If I honest, I;m not talking loud today"    Currently in Pain?  Yes    Pain Score  1     Pain Location  Back    Pain Orientation  Lateral;Lower    Pain Descriptors / Indicators  Aching    Pain Type  Chronic pain    Pain Onset  More than a month ago    Pain Frequency  Intermittent    Pain Relieving Factors  PT helped     Multiple Pain Sites  No            ADULT SLP TREATMENT - 12/28/17 0857      General Information   Behavior/Cognition  Alert;Cooperative;Pleasant mood      Treatment  Provided   Treatment provided  Cognitive-Linquistic      Cognitive-Linquistic Treatment   Treatment focused on  Dysarthria    Skilled Treatment  Loud /a/ to recalibrate volume with average of 92dB and mod I. Structured naming/reasoning task with average 68dB. In 3 5 minute conversations during structured task, pt maintained an average of 70dB with min visual cues. Over the 15 minutes of conversation, pt maintained attention and topic, asking "what were we talking about 1x. Working memory facilitated in Firefighter task, pt had to recall 3 items and state how they are related with a delay. He requested repetition./clarification 1/10 tasks - rare min A      Assessment / Recommendations / Plan   Plan  Continue with current plan of care      Progression Toward Goals   Progression toward goals  Progressing toward goals         SLP Short Term Goals - 12/28/17 1519      SLP SHORT TERM GOAL #1  Title  pt will demo working memory skills appropriate for recall of 60% of details    Time  1    Period  Weeks    Status  Achieved      SLP SHORT TERM GOAL #2   Title  pt will demo attention appropriate for 5 minutes simple conversation with modified independence (compensations)    Baseline  12/26/17    Time  1    Period  Weeks    Status  Achieved      SLP SHORT TERM GOAL #3   Title  Pt will utilize compensations for dysarthria in structured tasks with occasional min A in three sessions    Baseline  12/12/17, 12-14-17, 12/26/17    Status  Achieved       SLP Long Term Goals - 12/28/17 1520      SLP LONG TERM GOAL #1   Title  pt will demo attention skills adequate for functional verbal expression in 10 minutes simple-mod complex conversation over two sessions    Baseline  12/28/17;    Time  4    Period  Weeks    Status  On-going      SLP LONG TERM GOAL #2   Title  pt will complete functional working memory tasks with rare min A and compensatory measures    Time  4    Period  Weeks    Status   On-going      SLP LONG TERM GOAL #3   Title  Pt will utilize compensations to be intelligible with cues from spouse at sentence level over 2 sessions per pt/spouse report    Time  4    Period  Weeks    Status  On-going       Plan - 12/28/17 1518    Clinical Impression Statement  Conversation volume WFL today with rare min A. Working memory with min A. See skilled intervention. Continue skilled ST to maximize cognition and intellgilbility. Spouse to attend next week.     Speech Therapy Frequency  2x / week    Treatment/Interventions  Language facilitation;Internal/external aids;Compensatory techniques;SLP instruction and feedback;Multimodal communcation approach;Cognitive reorganization;Functional tasks;Cueing hierarchy;Patient/family education    Potential to Achieve Goals  Good    Potential Considerations  Severity of impairments    Consulted and Agree with Plan of Care  Patient       Patient will benefit from skilled therapeutic intervention in order to improve the following deficits and impairments:   Cognitive communication deficit  Dysarthria    Problem List Patient Active Problem List   Diagnosis Date Noted  . TMJ (temporomandibular joint syndrome) 10/10/2017  . Parkinson's disease ([place]) 03/20/2017  . Bilateral lower extremity edema 03/20/2017  . Chronic venous insufficiency 09/13/2016  . Fatigue 03/15/2016  . Parkinsonian features 09/15/2015  . ED (erectile dysfunction) 09/15/2015  . MDD (major depressive disorder), recurrent episode, severe ([place]) 02/10/2014  . Nonspecific abnormal electrocardiogram (ECG) (EKG) 02/07/2014  . Non-compliant behavior 02/03/2014  . Morton's neuroma of left foot 08/07/2013  . Attention deficit disorder without mention of hyperactivity 03/13/2012  . Hyperglycemia 01/26/2012  . Hypogonadism male 01/24/2012  . Syncope and collapse 01/20/2012  .  OSA (obstructive sleep apnea) 01/17/2011  . CHEST TIGHTNESS 02/18/2008    Lovvorn, Annye Rusk MS, CCC-SLP 12/28/2017, 3:22 PM  [facility] [address] [place], [place], [address] Phone: [phone]   Fax:  [phone]   Name: [name] [name] MRN: [id number] Date  of Birth: 1951-09-02

## 2017-12-28 NOTE — Therapy (Signed)
[facility] [address] [place], [place], [address] Phone: [phone]   Fax:  [phone]  Occupational Therapy Treatment  Patient Details  Name: [name] [name] MRN: [id number] Date of Birth: 02-07-1951 Referring Provider: Dr. Carles Collet   Encounter Date: 12/28/2017  OT End of Session - 12/28/17 0811    Visit Number  10    Number of Visits  17    Date for OT Re-Evaluation  02/03/18    Authorization Type  As of 12/01/17, Medicare primary/BCBS secondary    Authorization Time Period  75 visit limit combined for BCBS; cert date 0/6/26-06/06/84    Authorization - Visit Number  10    Authorization - Number of Visits  25    OT Start Time  0802    OT Stop Time  0845    OT Time Calculation (min)  43 min    Activity Tolerance  Patient tolerated treatment well    Behavior During Therapy  [facility] for tasks assessed/performed       Past Medical History:  Diagnosis Date  .  OSA (obstructive sleep apnea) 01/17/2011   npsg 2012:  AHI 67/hr. Auto titration 2012:  Optimal pressure 12cm.   . APPENDECTOMY, HX OF 02/18/2008   Qualifier: Diagnosis of  By: [place], [place]    . Cardiac murmur    as a child  . Depression   . Headache(784.0)   . HEADACHES, HX OF 02/18/2008   Qualifier: Diagnosis of  By: Danny Lawless CMA, [place]    . Parkinson disease ([place]) 11/2014  . Streptococcal meningitis    as an infant    Past Surgical History:  Procedure Laterality Date  . APPENDECTOMY  1967  . NASAL SINUS SURGERY     x 4 as a child  . VASECTOMY      There were no vitals filed for this visit.  Subjective Assessment - 12/28/17 0811    Pertinent History  Parkinson's disease, seeing illusions, hx of falls, hx of significant depression, mild cognitive impairment, sleep apnea, hypogonadism, ADD    Patient Stated Goals  improved balance and ease with ADLs    Currently in Pain?  No/denies            Treatment: arm bike x 6 mins level 1 for  conditioning, pt maintained 35-40 rpm with min v.c Seated diagonals closed chain with medium ball for trunk rotation, min v.c Dynamic rock and reach to toss scarves to targets with large amplitude movements, min v.c for performance, posture, and larger movements                OT Education - 12/28/17 0908    Education provided  Yes    Education Details  reveiwed PWR! moves seated basic 4, 10-20 reps each as pt is very stiff this morning    Person(s) Educated  Patient    Methods  Explanation;Demonstration;Verbal cues    Comprehension  Verbalized understanding;Returned demonstration       OT Short Term Goals - 12/26/17 0959      OT SHORT TERM GOAL #1   Title  Pt will be independent with PD specific  HEP.--    Time  4    Period  Weeks    Status  Achieved      OT SHORT TERM GOAL #2   Title  Pt will verbalize understanding of ways to prevent future complications and current available/appropriate community resources prn.    Time  4  Status  Achieved      OT SHORT TERM GOAL #3   Title  Pt will improve bilateral hand coordination as shown by fastening/unfastening 3 buttons in 50sec or less.    Baseline  57.34    Time  4    Period  Weeks    Status  On-going 56.12 sec       OT SHORT TERM GOAL #4   Title  Pt will report incr ease with donning pants socks/shoes using adaptive strategies/AE prn.    Time  4    Status  Achieved      OT SHORT TERM GOAL #5   Title  Pt will demonstrate ability to write a 3 sentences with 100% legibility and only min decrease in letter size.    Status  Achieved        OT Long Term Goals - 11/09/17 1249      OT LONG TERM GOAL #1   Title  Pt will verbalize understanding of adapted strategies for ADLs/IADLs--    Time  8    Period  Weeks    Status  New    Target Date  01/08/18      OT LONG TERM GOAL #2   Title  Pt will demonstrate improved functional standing balance as evidenced by increasing bilateral  standing functional reach to 10  inches or greater    Baseline  RUE 9.5 inches, LUE 8.5 in    Time  8    Period  Weeks    Status  New      OT LONG TERM GOAL #3   Title  Pt will improve bilateral hand coordination as shown by fastening/unfastening 3 buttons in 45sec or less.    Time  8    Period  Weeks    Status  New            Plan - 12/28/17 0901    Clinical Impression Statement  Pt is progressing towards goals. He benefits from repetition/ reinforcement of large amplitude movements with functional activity.    Occupational Profile and client history currently impacting functional performance  PMH that includes: seeing illusions, hx of falls, hx of significant depression, mild cognitive impairment, sleep apnea, hypogonadism, ADD. Pt retired due to difficulty at work and no longer drives. Pt reports slowing of ADLs and incr cognitive deficits impacting function.     Rehab Potential  Good    Current Impairments/barriers affecting progress:  cognitive deficits, depression    OT Frequency  2x / week    OT Duration  8 weeks    OT Treatment/Interventions  Self-care/ADL training;Moist Heat;Fluidtherapy;Balance training;Therapeutic activities;Cognitive remediation/compensation;Therapeutic exercise;Ultrasound;Cryotherapy;Neuromuscular education;Visual/perceptual remediation/compensation;Passive range of motion;Functional Mobility Training;Patient/family education;Manual Therapy;Energy conservation;Paraffin    Plan  check goals and d/c next week, screen in 6 mons    Consulted and Agree with Plan of Care  Patient       Patient will benefit from skilled therapeutic intervention in order to improve the following deficits and impairments:  Decreased cognition, Impaired flexibility, Decreased mobility, Decreased coordination, Decreased endurance, Decreased range of motion, Decreased strength, Impaired UE functional use, Impaired tone, Impaired perceived functional ability, Decreased safety awareness, Difficulty walking,  Decreased balance, Decreased activity tolerance, Impaired vision/preception, Improper spinal/pelvic alignment  Visit Diagnosis: Other symptoms and signs involving the nervous system  Other symptoms and signs involving the musculoskeletal system  Other lack of coordination  Attention and concentration deficit  Other abnormalities of gait and mobility  Unsteadiness on feet  Problem List Patient Active Problem List   Diagnosis Date Noted  . TMJ (temporomandibular joint syndrome) 10/10/2017  . Parkinson's disease ([place]) 03/20/2017  . Bilateral lower extremity edema 03/20/2017  . Chronic venous insufficiency 09/13/2016  . Fatigue 03/15/2016  . Parkinsonian features 09/15/2015  . ED (erectile dysfunction) 09/15/2015  . MDD (major depressive disorder), recurrent episode, severe ([place]) 02/10/2014  . Nonspecific abnormal electrocardiogram (ECG) (EKG) 02/07/2014  . Non-compliant behavior 02/03/2014  . Morton's neuroma of left foot 08/07/2013  . Attention deficit disorder without mention of hyperactivity 03/13/2012  . Hyperglycemia 01/26/2012  . Hypogonadism male 01/24/2012  . Syncope and collapse 01/20/2012  .  OSA (obstructive sleep apnea) 01/17/2011  . CHEST TIGHTNESS 02/18/2008    RINE,KATHRYN 12/28/2017, 9:09 AM  [facility] [address] [place], [place], [address] Phone: [phone]   Fax:  [phone]  Name: [name] [name] MRN: [id number] Date of Birth: 1951-05-16

## 2017-12-28 NOTE — Therapy (Signed)
[facility] [address] [place], [place], [address] Phone: [phone]   Fax:  [phone]  Physical Therapy Treatment  Patient Details  Name: [name] [name] MRN: [id number] Date of Birth: 14-Nov-1950 Referring Provider: Dr. Carles Collet   Encounter Date: 12/28/2017  PT End of Session - 12/28/17 1047    Visit Number  10    Number of Visits  17    Date for PT Re-Evaluation  01/29/18    Authorization Type  [employer]-75 visit limit combined PT, OT, speech    Authorization - Visit Number  10    Authorization - Number of Visits  75 75 PT, OT, speech combined    PT Start Time  0933    PT Stop Time  1015    PT Time Calculation (min)  42 min    Activity Tolerance  Patient tolerated treatment well    Behavior During Therapy  [facility] for tasks assessed/performed       Past Medical History:  Diagnosis Date  .  OSA (obstructive sleep apnea) 01/17/2011   npsg 2012:  AHI 67/hr. Auto titration 2012:  Optimal pressure 12cm.   . APPENDECTOMY, HX OF 02/18/2008   Qualifier: Diagnosis of  By: [place], [place]    . Cardiac murmur    as a child  . Depression   . Headache(784.0)   . HEADACHES, HX OF 02/18/2008   Qualifier: Diagnosis of  By: Danny Lawless CMA, [place]    . Parkinson disease ([place]) 11/2014  . Streptococcal meningitis    as an infant    Past Surgical History:  Procedure Laterality Date  . APPENDECTOMY  1967  . NASAL SINUS SURGERY     x 4 as a child  . VASECTOMY      There were no vitals filed for this visit.  Subjective Assessment - 12/28/17 0935    Subjective  No falls, no changes since last visit. Reports feeling more stiff today. (Just came from SLP and sitting x 45 minutes)    Patient Stated Goals  Pt's goals for therapy are to be doing exercise and to improve balance.    Currently in Pain?  Yes    Pain Score  1     Pain Location  Back    Pain Orientation  Lower    Pain Descriptors / Indicators  Aching    Pain  Type  Chronic pain    Pain Onset  In the past 7 days    Pain Frequency  Intermittent                       [facility] Adult PT Treatment/Exercise - 12/28/17 0942      Transfers   Floor to Transfer Details (indicate cue type and reason)  pt able to get down to floor and onto his back modified independent with incr time due to bradykinesia; returned to 1/2 kneeling modified independent and then to stand by pushing on forward knee with close supervision for safety      Ambulation/Gait   Ambulation/Gait Assistance  6: Modified independent (Device/Increase time);5: Supervision    Ambulation/Gait Assistance Details  vc for increasing step length ("reach forward with your heels") and velocity with noted improvement in UE swing    Ambulation Distance (Feet)  600 Feet 800    Assistive device  None    Gait Pattern  Step-through pattern;Decreased arm swing - right;Decreased arm swing - left;Decreased step length - right;Decreased step length -  left;Decreased trunk rotation;Trunk flexed;Poor foot clearance - left;Poor foot clearance - right    Ambulation Surface  Indoor    Pre-Gait Activities  at counter sideways, swing opposite UE freely for max ROM; then slow and coordinate opposite leg stepping forward and backward x 20 reps each side; AAROM to UE when began to make smaller movements      Posture/Postural Control   Posture/Postural Control  Postural limitations    Postural Limitations  Rounded Shoulders;Forward head    Posture Comments  cues throughout for looking forward and shoulders back      Ankle Exercises: Aerobic   Nustep  L5 x 5 min with steps>70 per minute for intensity        [facility]) - 12/28/17 1032    PWR! Up  20    PWR! Rock  20    PWR! Twist  20    PWR Step  20    Comments  Initial 5 reps of each slow and deliberate for technique and stretch and then advance to increased intensity/velocity of movements; PWR Step required most cues and assist for proper foot  placement and supination of forearm          PT Education - 12/28/17 1046    Education Details  option of PWR! classes (discussed but could not find dates of upcoming session to print out for him)    Person(s) Educated  Patient    Methods  Explanation    Comprehension  Verbalized understanding       PT Short Term Goals - 12/19/17 0850      PT SHORT TERM GOAL #1   Title  Pt will be independent with HEP to target Parkinson's specific deficits.  TARGET for all STGS 12/22/17    Time  4    Period  Weeks    Status  Achieved      PT SHORT TERM GOAL #2   Title  Pt will improve TUG score to less than or equal to 13.5 seconds for decreased fall risk.    Baseline  12/19/17  12.44 sec    Time  4    Period  Weeks    Status  Achieved      PT SHORT TERM GOAL #3   Title  Pt will perform sit<>stand transfers at least 8 of 10 reps with no hesitation and posterior lean.    Baseline  12/19/17    Time  4    Period  Weeks    Status  Achieved      PT SHORT TERM GOAL #4   Title  Pt will improve MiniBESTest score to at least 15/28 for decreased fall risk.    Baseline  12/19/17  15/28    Time  4    Period  Weeks    Status  Achieved      PT SHORT TERM GOAL #5   Title  Pt will improve ABC scale score by at least 10% to demonstrate improved balance confidence with daily activities.    Baseline  Eval:  57.5%  12/19/17  57.5%    Time  4    Period  Weeks    Status  Not Met        PT Long Term Goals - 11/30/17 1210      PT LONG TERM GOAL #1   Title  Pt will verbalize understanding of fall prevention/tips to reduce freezing with gait.  UPDATED TARGET for all LTGs 01/19/18    Time  8    Period  Weeks    Status  New      PT LONG TERM GOAL #2   Title  Pt will improve TUG cognitive to less than or equal to 15 seconds for decreased fall risk/improved dual tasking with gait.    Time  8    Period  Weeks    Status  New      PT LONG TERM GOAL #3   Title  Pt will improve MiniBESTest score to at  least 19/28 for decreased fall risk.    Time  8    Period  Weeks    Status  New      PT LONG TERM GOAL #4   Title  Pt will improve gait velocity to at least 2 ft/sec for improved gait efficiency and safety in community    Time  8    Period  Weeks    Status  New      PT LONG TERM GOAL #5   Title  Pt will ambulate at least 1000 ft, indoor and outdoor surfaces, including ramps and inclines, with least restrictive assistive device modified independently for improved safety with outdoor gait.    Time  8    Period  Weeks    Status  New            Plan - 12/28/17 1048    Clinical Impression Statement  Session focused on decreasing stiffness through use of higher intensity aerobic activity with all 4s (Nustep), followed by PWR! moves for stretching/big movement patterns, and gait training for improved velocity/stability. Patient expressed interest in PWR! moves classes after completes PT sessions, however is not sure there is a time/class that will work for him. Patient reports wife will attend next session with him for education/carryover.     Rehab Potential  Good    Clinical Impairments Affecting Rehab Potential  Memory deficits per speech eval notes    PT Frequency  2x / week    PT Duration  8 weeks plus eval; recert 01/25/94 to cover POC    PT Treatment/Interventions  ADLs/Self Care Home Management;DME Instruction;Gait training;Stair training;Functional mobility training;Therapeutic activities;Therapeutic exercise;Balance training;Neuromuscular re-education;Patient/family education    PT Next Visit Plan   wife attending next session; give handout re: PWR! classes (days/dates/times/cost); Sci-Fit activity initially for warm-up (he enjoys); he wants to practice floor transfers each visit (have him get down all the way to supine);  gait activities for foot clearance, step length, arm swing; PWR! standing with intensity Try to discuss PWR! Moves exercise classes    Consulted and Agree with  Plan of Care  Patient       Patient will benefit from skilled therapeutic intervention in order to improve the following deficits and impairments:  Abnormal gait, Decreased balance, Decreased mobility, Difficulty walking, Postural dysfunction  Visit Diagnosis: Other symptoms and signs involving the nervous system  Other abnormalities of gait and mobility  Abnormal posture  Other symptoms and signs involving the musculoskeletal system     Problem List Patient Active Problem List   Diagnosis Date Noted  . TMJ (temporomandibular joint syndrome) 10/10/2017  . Parkinson's disease ([place]) 03/20/2017  . Bilateral lower extremity edema 03/20/2017  . Chronic venous insufficiency 09/13/2016  . Fatigue 03/15/2016  . Parkinsonian features 09/15/2015  . ED (erectile dysfunction) 09/15/2015  . MDD (major depressive disorder), recurrent episode, severe ([place]) 02/10/2014  . Nonspecific abnormal electrocardiogram (ECG) (EKG) 02/07/2014  . Non-compliant behavior 02/03/2014  . Morton's neuroma  of left foot 08/07/2013  . Attention deficit disorder without mention of hyperactivity 03/13/2012  . Hyperglycemia 01/26/2012  . Hypogonadism male 01/24/2012  . Syncope and collapse 01/20/2012  .  OSA (obstructive sleep apnea) 01/17/2011  . CHEST TIGHTNESS 02/18/2008    Rexanne Mano, PT 12/28/2017, 10:56 AM  [facility] [address] [place], [place], [address] Phone: [phone]   Fax:  [phone]  Name: [name] [name] MRN: [id number] Date of Birth: 03-04-1951

## 2018-01-01 ENCOUNTER — Ambulatory Visit (AMBULATORY_SURGERY_CENTER): Payer: Self-pay | Admitting: *Deleted

## 2018-01-01 ENCOUNTER — Other Ambulatory Visit: Payer: Self-pay

## 2018-01-01 ENCOUNTER — Other Ambulatory Visit: Payer: Self-pay | Admitting: Neurology

## 2018-01-01 VITALS — Ht 74.0 in | Wt 296.0 lb

## 2018-01-01 DIAGNOSIS — K921 Melena: Secondary | ICD-10-CM

## 2018-01-01 MED ORDER — NA SULFATE-K SULFATE-MG SULF 17.5-3.13-1.6 GM/177ML PO SOLN
ORAL | 0 refills | Status: DC
Start: 2018-01-01 — End: 2018-01-31

## 2018-01-01 NOTE — Progress Notes (Signed)
Wife at side during PV. Patient denies any allergies to eggs or soy. Patient denies any problems with anesthesia/sedation. Patient denies any oxygen use at home. Patient denies taking any diet/weight loss medications or blood thinners. patient states he has "blood in stool few times in the last 2 weeks". EMMI education assisgned to patient on colonoscopy, this was explained and instructions given to patient.

## 2018-01-02 ENCOUNTER — Ambulatory Visit: Payer: Medicare Other

## 2018-01-02 ENCOUNTER — Ambulatory Visit: Payer: Medicare Other | Attending: Neurology | Admitting: Physical Therapy

## 2018-01-02 ENCOUNTER — Ambulatory Visit: Payer: Medicare Other | Admitting: Occupational Therapy

## 2018-01-02 ENCOUNTER — Ambulatory Visit (INDEPENDENT_AMBULATORY_CARE_PROVIDER_SITE_OTHER): Payer: Medicare Other | Admitting: Psychology

## 2018-01-02 ENCOUNTER — Encounter: Payer: Self-pay | Admitting: Physical Therapy

## 2018-01-02 DIAGNOSIS — R41841 Cognitive communication deficit: Secondary | ICD-10-CM | POA: Diagnosis not present

## 2018-01-02 DIAGNOSIS — R29898 Other symptoms and signs involving the musculoskeletal system: Secondary | ICD-10-CM | POA: Insufficient documentation

## 2018-01-02 DIAGNOSIS — R471 Dysarthria and anarthria: Secondary | ICD-10-CM | POA: Diagnosis not present

## 2018-01-02 DIAGNOSIS — R2689 Other abnormalities of gait and mobility: Secondary | ICD-10-CM | POA: Insufficient documentation

## 2018-01-02 DIAGNOSIS — R29818 Other symptoms and signs involving the nervous system: Secondary | ICD-10-CM

## 2018-01-02 DIAGNOSIS — R41840 Attention and concentration deficit: Secondary | ICD-10-CM

## 2018-01-02 DIAGNOSIS — R2681 Unsteadiness on feet: Secondary | ICD-10-CM | POA: Insufficient documentation

## 2018-01-02 DIAGNOSIS — F4323 Adjustment disorder with mixed anxiety and depressed mood: Secondary | ICD-10-CM

## 2018-01-02 DIAGNOSIS — R278 Other lack of coordination: Secondary | ICD-10-CM | POA: Insufficient documentation

## 2018-01-02 NOTE — Therapy (Signed)
[facility] [address] [place], [place], [address] Phone: [phone]   Fax:  [phone]  Physical Therapy Treatment  Patient Details  Name: [name] [name] MRN: [id number] Date of Birth: [birth year] Referring Provider: Dr. Carles Collet   Encounter Date: 01/02/2018  PT End of Session - 01/02/18 1143    Visit Number  11    Number of Visits  17    Date for PT Re-Evaluation  01/29/18    Authorization Type  [employer]-75 visit limit combined PT, OT, speech    Authorization - Visit Number  11    Authorization - Number of Visits  75 75 PT, OT, speech combined    PT Start Time  0805    PT Stop Time  0847    PT Time Calculation (min)  42 min    Activity Tolerance  Patient tolerated treatment well    Behavior During Therapy  [facility] for tasks assessed/performed       Past Medical History:  Diagnosis Date  .  OSA (obstructive sleep apnea) 01/17/2011   npsg 2012:  AHI 67/hr. Auto titration 2012:  Optimal pressure 12cm.   . APPENDECTOMY, HX OF 02/18/2008   Qualifier: Diagnosis of  By: [place], [place]    . Cardiac murmur    as a child  . Depression   . Headache(784.0)   . HEADACHES, HX OF 02/18/2008   Qualifier: Diagnosis of  By: Danny Lawless CMA, [place]    . Parkinson disease ([place]) 11/2014  . Sinus mucosal thickening    pt unable to lay flat  . Streptococcal meningitis    as an infant    Past Surgical History:  Procedure Laterality Date  . APPENDECTOMY  1967  . NASAL SINUS SURGERY     x 4 as a child  . VASECTOMY      There were no vitals filed for this visit.  Subjective Assessment - 01/02/18 0807    Subjective  No changes, no falls.  Have a stiff neck this morning.    Patient Stated Goals  Pt's goals for therapy are to be doing exercise and to improve balance.    Currently in Pain?  Yes    Pain Score  2     Pain Location  Neck    Pain Orientation  Right    Pain Descriptors / Indicators  Sore    Pain Onset  Today     Pain Frequency  Intermittent    Aggravating Factors   turning to R; woke up with pain this morning    Pain Relieving Factors  unsure                       [facility] Adult PT Treatment/Exercise - 01/02/18 0811      Transfers   Transfers  Sit to Stand;Stand to Sit    Sit to Stand  6: Modified independent (Device/Increase time)    Stand to Sit  6: Modified independent (Device/Increase time);Without upper extremity assist;To bed    Number of Reps  10 reps    Transfer Cueing  Cues for full knee extension, upright stand      Ambulation/Gait   Ambulation/Gait  Yes    Ambulation/Gait Assistance  6: Modified independent (Device/Increase time);5: Supervision    Ambulation/Gait Assistance Details  VCs to increase step length and arm swing    Ambulation Distance (Feet)  600 Feet then 400    Assistive device  None  Gait Pattern  Step-through pattern;Decreased arm swing - right;Decreased arm swing - left;Decreased step length - right;Decreased step length - left;Decreased trunk rotation;Trunk flexed;Poor foot clearance - left;Poor foot clearance - right    Ambulation Surface  Indoor;Level;Unlevel Mat surfaces, obstacles to simulate outdoors    Pre-Gait Activities  Initiated gait with cues for wide BOS weightshift>marching in place x 3 reps      Self-Care   Self-Care  Other Self-Care Comments    Other Self-Care Comments   Discussed how to incorporate aerobic activity into ACT routine days-to try to get there earlier to get in aerobic machine work prior to his appt there.  Also, reiterated importance of/rationale for aerobic activity in people with PD.  Provided handout for PWR! Moves exercise class, with explanation of benefits of continued performance of PWR! Moves exercises.      Knee/Hip Exercises: Aerobic   Other Aerobic  Sci fit stepper L4.0 x 10 minutes, with added cognitive task, cues to keep RPM >70 for increased intensity as warm up at initiation of PT session.  Pt c/o  weakness in legs, some lightheadedness after SciFit-checked BP:  121/81 HR 75 Discussed how to incorporate into ACT days        [facility]) - 01/02/18 0840    PWR! exercises  Moves in standing    PWR! Up  x 10    PWR! Rock  x 10    PWR! Twist  x 10    PWR Step  x 10 to each side    Comments  Cues for increased intensity and effort, especially with stepping exercise, to simulate balance recovery          PT Education - 01/02/18 1142    Education provided  Yes    Education Details  PWR! Moves exercise class information, benefits of and how to be able to incorporate aerobic activity into his current ACT routine    Person(s) Educated  Patient    Methods  Explanation;Demonstration;Handout    Comprehension  Verbalized understanding;Returned demonstration       PT Short Term Goals - 12/19/17 0850      PT SHORT TERM GOAL #1   Title  Pt will be independent with HEP to target Parkinson's specific deficits.  TARGET for all STGS 12/22/17    Time  4    Period  Weeks    Status  Achieved      PT SHORT TERM GOAL #2   Title  Pt will improve TUG score to less than or equal to 13.5 seconds for decreased fall risk.    Baseline  12/19/17  12.44 sec    Time  4    Period  Weeks    Status  Achieved      PT SHORT TERM GOAL #3   Title  Pt will perform sit<>stand transfers at least 8 of 10 reps with no hesitation and posterior lean.    Baseline  12/19/17    Time  4    Period  Weeks    Status  Achieved      PT SHORT TERM GOAL #4   Title  Pt will improve MiniBESTest score to at least 15/28 for decreased fall risk.    Baseline  12/19/17  15/28    Time  4    Period  Weeks    Status  Achieved      PT SHORT TERM GOAL #5   Title  Pt will improve ABC scale score by at least  10% to demonstrate improved balance confidence with daily activities.    Baseline  Eval:  57.5%  12/19/17  57.5%    Time  4    Period  Weeks    Status  Not Met        PT Long Term Goals - 11/30/17 1210      PT LONG TERM  GOAL #1   Title  Pt will verbalize understanding of fall prevention/tips to reduce freezing with gait.  UPDATED TARGET for all LTGs 01/19/18    Time  8    Period  Weeks    Status  New      PT LONG TERM GOAL #2   Title  Pt will improve TUG cognitive to less than or equal to 15 seconds for decreased fall risk/improved dual tasking with gait.    Time  8    Period  Weeks    Status  New      PT LONG TERM GOAL #3   Title  Pt will improve MiniBESTest score to at least 19/28 for decreased fall risk.    Time  8    Period  Weeks    Status  New      PT LONG TERM GOAL #4   Title  Pt will improve gait velocity to at least 2 ft/sec for improved gait efficiency and safety in community    Time  8    Period  Weeks    Status  New      PT LONG TERM GOAL #5   Title  Pt will ambulate at least 1000 ft, indoor and outdoor surfaces, including ramps and inclines, with least restrictive assistive device modified independently for improved safety with outdoor gait.    Time  8    Period  Weeks    Status  New            Plan - 01/02/18 1258    Clinical Impression Statement  Continued to focus on large amplitude movement patterns with exercise and gait as well as higher intensity aerobic activity on SciFIT.  Pt needs continued cues for bigger movement patterns.  Plans are for discharge next visit; information was given to patient for PWR! Moves exercise class.    Rehab Potential  Good    Clinical Impairments Affecting Rehab Potential  Memory deficits per speech eval notes    PT Frequency  2x / week    PT Duration  8 weeks plus eval; recert 8/41/32 to cover POC    PT Treatment/Interventions  ADLs/Self Care Home Management;DME Instruction;Gait training;Stair training;Functional mobility training;Therapeutic activities;Therapeutic exercise;Balance training;Neuromuscular re-education;Patient/family education    PT Next Visit Plan  Check goals and plan for discharge next visit    Consulted and Agree with  Plan of Care  Patient       Patient will benefit from skilled therapeutic intervention in order to improve the following deficits and impairments:  Abnormal gait, Decreased balance, Decreased mobility, Difficulty walking, Postural dysfunction  Visit Diagnosis: Other abnormalities of gait and mobility  Unsteadiness on feet  Other symptoms and signs involving the nervous system     Problem List Patient Active Problem List   Diagnosis Date Noted  . TMJ (temporomandibular joint syndrome) 10/10/2017  . Parkinson's disease ([place]) 03/20/2017  . Bilateral lower extremity edema 03/20/2017  . Chronic venous insufficiency 09/13/2016  . Fatigue 03/15/2016  . Parkinsonian features 09/15/2015  . ED (erectile dysfunction) 09/15/2015  . MDD (major depressive disorder), recurrent episode, severe ([place]) 02/10/2014  .  Nonspecific abnormal electrocardiogram (ECG) (EKG) 02/07/2014  . Non-compliant behavior 02/03/2014  . Morton's neuroma of left foot 08/07/2013  . Attention deficit disorder without mention of hyperactivity 03/13/2012  . Hyperglycemia 01/26/2012  . Hypogonadism male 01/24/2012  . Syncope and collapse 01/20/2012  .  OSA (obstructive sleep apnea) 01/17/2011  . CHEST TIGHTNESS 02/18/2008    Kimanh Templeman W. 01/02/2018, 1:07 PM Frazier Butt., PT  [facility] [address] [place], [place], [address] Phone: [phone]   Fax:  [phone]  Name: ALEKSANDR PELLOW MRN: [id number] Date of Birth: 05-05-1951

## 2018-01-02 NOTE — Therapy (Signed)
[facility] [address] [place], [place], [address] Phone: [phone]   Fax:  [phone]  Occupational Therapy Treatment  Patient Details  Name: [name] [name] MRN: [id number] Date of Birth: April 29, 1951 Referring Provider: Dr. Carles Collet   Encounter Date: 01/02/2018  OT End of Session - 01/02/18 0854    Visit Number  11    Number of Visits  17    Date for OT Re-Evaluation  02/03/18    Authorization Type  As of 12/01/17, Medicare primary/BCBS secondary    Authorization Time Period  75 visit limit combined for [employer]; cert date 05/07/01-04/08/40    Authorization - Visit Number  11    Authorization - Number of Visits  25    OT Start Time  0848    OT Stop Time  0930    OT Time Calculation (min)  42 min    Activity Tolerance  Patient tolerated treatment well    Behavior During Therapy  [facility] for tasks assessed/performed       Past Medical History:  Diagnosis Date  .  OSA (obstructive sleep apnea) 01/17/2011   npsg 2012:  AHI 67/hr. Auto titration 2012:  Optimal pressure 12cm.   . APPENDECTOMY, HX OF 02/18/2008   Qualifier: Diagnosis of  By: [place], [place]    . Cardiac murmur    as a child  . Depression   . Headache(784.0)   . HEADACHES, HX OF 02/18/2008   Qualifier: Diagnosis of  By: Danny Lawless CMA, [place]    . Parkinson disease ([place]) 11/2014  . Sinus mucosal thickening    pt unable to lay flat  . Streptococcal meningitis    as an infant    Past Surgical History:  Procedure Laterality Date  . APPENDECTOMY  1967  . NASAL SINUS SURGERY     x 4 as a child  . VASECTOMY      There were no vitals filed for this visit.  Subjective Assessment - 01/02/18 0853    Subjective   neck    Patient Stated Goals  improved balance and ease with ADLs    Pain Score  2     Pain Location  Neck    Pain Descriptors / Indicators  Aching    Pain Type  Acute pain    Pain Onset  In the past 7 days    Pain Frequency  Intermittent    Aggravating Factors   woke up with pain this a.m.    Pain Relieving Factors  unsure    Multiple Pain Sites  No           Treatment:Arm bike x 5 mins  For conditioning, min v.c to maintain speed, pt maintained 35-38 rpm Reviewed adapted strategies for fastening buttons, mod v.c Started checking progress towards remaining goals. Dynamic step and reach with trunk rotation to place graded clothespins on vertical antenna mod v.c. For larger amplitude movements.                  OT Short Term Goals - 01/02/18 0907      OT SHORT TERM GOAL #1   Title  Pt will be independent with PD specific  HEP.--    Time  4    Period  Weeks    Status  Achieved      OT SHORT TERM GOAL #2   Title  Pt will verbalize understanding of ways to prevent future complications and current available/appropriate community resources prn.  Time  4    Status  Achieved      OT SHORT TERM GOAL #3   Title  Pt will improve bilateral hand coordination as shown by fastening/unfastening 3 buttons in 50sec or less.    Baseline  57.34    Time  4    Period  Weeks    Status  On-going 56.12 sec       OT SHORT TERM GOAL #4   Title  Pt will report incr ease with donning pants socks/shoes using adaptive strategies/AE prn.    Time  4    Status  Achieved      OT SHORT TERM GOAL #5   Title  Pt will demonstrate ability to write a 3 sentences with 100% legibility and only min decrease in letter size.    Status  Achieved        OT Long Term Goals - 11/09/17 1249      OT LONG TERM GOAL #1   Title  Pt will verbalize understanding of adapted strategies for ADLs/IADLs--    Time  8    Period  Weeks    Status  New    Target Date  01/08/18      OT LONG TERM GOAL #2   Title  Pt will demonstrate improved functional standing balance as evidenced by increasing bilateral  standing functional reach to 10 inches or greater    Baseline  RUE 9.5 inches, LUE 8.5 in    Time  8    Period  Weeks    Status  New       OT LONG TERM GOAL #3   Title  Pt will improve bilateral hand coordination as shown by fastening/unfastening 3 buttons in 45sec or less.    Time  8    Period  Weeks    Status  New            Plan - 01/02/18 0907    Clinical Impression Statement  Pt demonstrates progress towards goals. Anticipate d/c next vist .    Occupational Profile and client history currently impacting functional performance  PMH that includes: seeing illusions, hx of falls, hx of significant depression, mild cognitive impairment, sleep apnea, hypogonadism, ADD. Pt retired due to difficulty at work and no longer drives. Pt reports slowing of ADLs and incr cognitive deficits impacting function.     Occupational performance deficits (Please refer to evaluation for details):  ADL's;IADL's;Leisure;Social Participation    Rehab Potential  Good    Current Impairments/barriers affecting progress:  cognitive deficits, depression    OT Frequency  2x / week    OT Duration  8 weeks    OT Treatment/Interventions  Self-care/ADL training;Moist Heat;Fluidtherapy;Balance training;Therapeutic activities;Cognitive remediation/compensation;Therapeutic exercise;Ultrasound;Cryotherapy;Neuromuscular education;Visual/perceptual remediation/compensation;Passive range of motion;Functional Mobility Training;Patient/family education;Manual Therapy;Energy conservation;Paraffin    Plan  check goals and d/c next visit, screen in 6 mons    Consulted and Agree with Plan of Care  Patient       Patient will benefit from skilled therapeutic intervention in order to improve the following deficits and impairments:  Decreased cognition, Impaired flexibility, Decreased mobility, Decreased coordination, Decreased endurance, Decreased range of motion, Decreased strength, Impaired UE functional use, Impaired tone, Impaired perceived functional ability, Decreased safety awareness, Difficulty walking, Decreased balance, Decreased activity tolerance, Impaired  vision/preception, Improper spinal/pelvic alignment  Visit Diagnosis: No diagnosis found.    Problem List Patient Active Problem List   Diagnosis Date Noted  . TMJ (temporomandibular joint syndrome) 10/10/2017  .  Parkinson's disease ([place]) 03/20/2017  . Bilateral lower extremity edema 03/20/2017  . Chronic venous insufficiency 09/13/2016  . Fatigue 03/15/2016  . Parkinsonian features 09/15/2015  . ED (erectile dysfunction) 09/15/2015  . MDD (major depressive disorder), recurrent episode, severe ([place]) 02/10/2014  . Nonspecific abnormal electrocardiogram (ECG) (EKG) 02/07/2014  . Non-compliant behavior 02/03/2014  . Morton's neuroma of left foot 08/07/2013  . Attention deficit disorder without mention of hyperactivity 03/13/2012  . Hyperglycemia 01/26/2012  . Hypogonadism male 01/24/2012  . Syncope and collapse 01/20/2012  .  OSA (obstructive sleep apnea) 01/17/2011  . CHEST TIGHTNESS 02/18/2008    [facility] 01/02/2018, 9:09 AM Theone Murdoch, OTR/L [phone] Phone: [phone] 11:48 AM 01/02/18 [facility] [address] [place], [place], [address] Phone: [phone]   Fax:  [phone]  Name: [name] [name] MRN: [id number] Date of Birth: July 09, 1951

## 2018-01-02 NOTE — Therapy (Signed)
[facility] [address] [place], [place], [address] Phone: [phone]   Fax:  [phone]  Speech Language Pathology Treatment  Patient Details  Name: [name] [name] MRN: [id number] Date of Birth: 1951/02/13 Referring Provider: Alonza Bogus, DO   Encounter Date: 01/02/2018  End of Session - 01/02/18 0954    Visit Number  10    Number of Visits  17    Date for SLP Re-Evaluation  01/19/18    Authorization Type  75 total visits    Authorization - Visit Number  72    Authorization - Number of Visits  25    SLP Start Time  7591    SLP Stop Time   6384    SLP Time Calculation (min)  40 min    Activity Tolerance  Patient tolerated treatment well       Past Medical History:  Diagnosis Date  .  OSA (obstructive sleep apnea) 01/17/2011   npsg 2012:  AHI 67/hr. Auto titration 2012:  Optimal pressure 12cm.   . APPENDECTOMY, HX OF 02/18/2008   Qualifier: Diagnosis of  By: [place], [place]    . Cardiac murmur    as a child  . Depression   . Headache(784.0)   . HEADACHES, HX OF 02/18/2008   Qualifier: Diagnosis of  By: Danny Lawless CMA, [place]    . Parkinson disease ([place]) 11/2014  . Sinus mucosal thickening    pt unable to lay flat  . Streptococcal meningitis    as an infant    Past Surgical History:  Procedure Laterality Date  . APPENDECTOMY  1967  . NASAL SINUS SURGERY     x 4 as a child  . VASECTOMY      There were no vitals filed for this visit.  Subjective Assessment - 01/02/18 1001    Subjective  "My wife doesn't say 'Stop mumbling' anymore."    Currently in Pain?  No/denies            ADULT SLP TREATMENT - 01/02/18 1002      General Information   Behavior/Cognition  Alert;Cooperative;Pleasant mood      Treatment Provided   Treatment provided  Cognitive-Linquistic      Cognitive-Linquistic Treatment   Treatment focused on  Dysarthria    Skilled Treatment  SLP used loud /a/ to recalibrate  pt's conversational volume and engage abdominal musculature. Average was in low 90s dB with nonverbal cues. In short conversation of 5-10 minutes pt maintained loudness in upper 60s-low 70s dB with min verbal and nonverbal cues. In longer conversation of 10+ minutes pt average was lower 70s with occasional min nonverbal cues.       Assessment / Recommendations / Plan   Plan  Continue with current plan of care      Progression Toward Goals   Progression toward goals  Progressing toward goals         SLP Short Term Goals - 12/28/17 1519      SLP SHORT TERM GOAL #1   Title  pt will demo working Buyer, retail appropriate for recall of 60% of details    Time  1    Period  Weeks    Status  Achieved      SLP SHORT TERM GOAL #2   Title  pt will demo attention appropriate for 5 minutes simple conversation with modified independence (compensations)    Baseline  12/26/17    Time  1    Period  Weeks    Status  Achieved      SLP SHORT TERM GOAL #3   Title  Pt will utilize compensations for dysarthria in structured tasks with occasional min A in three sessions    Baseline  12/12/17, 12-14-17, 12/26/17    Status  Achieved       SLP Long Term Goals - 01/02/18 1031      SLP LONG TERM GOAL #1   Title  pt will demo attention skills adequate for functional verbal expression in 10 minutes simple-mod complex conversation over two sessions    Time  3    Status  Achieved      SLP LONG TERM GOAL #2   Title  pt will complete functional working memory tasks with rare min A and compensatory measures    Time  3    Period  Weeks    Status  On-going      SLP LONG TERM GOAL #3   Title  Pt will utilize compensations to be intelligible with cues from spouse at sentence level over 2 sessions per pt/spouse report    Baseline  01-02-18    Time  3    Period  Weeks    Status  On-going       Plan - 01/02/18 1030    Clinical Impression Statement  Conversation volume (<10 mknutes) WFL/WNL today with min A,  and >10 minutes with occasioal nonverbal cues. See skilled intervention for further details. Continue skilled ST to maximize cognition and intellgilbility. Spouse to attend next week.     Speech Therapy Frequency  2x / week    Duration  -- 8 weeks/16 sessions    Treatment/Interventions  Language facilitation;Internal/external aids;Compensatory techniques;SLP instruction and feedback;Multimodal communcation approach;Cognitive reorganization;Functional tasks;Cueing hierarchy;Patient/family education    Potential to Achieve Goals  Good    Potential Considerations  Severity of impairments    Consulted and Agree with Plan of Care  Patient       Patient will benefit from skilled therapeutic intervention in order to improve the following deficits and impairments:   Dysarthria  Cognitive communication deficit    Problem List Patient Active Problem List   Diagnosis Date Noted  . TMJ (temporomandibular joint syndrome) 10/10/2017  . Parkinson's disease ([place]) 03/20/2017  . Bilateral lower extremity edema 03/20/2017  . Chronic venous insufficiency 09/13/2016  . Fatigue 03/15/2016  . Parkinsonian features 09/15/2015  . ED (erectile dysfunction) 09/15/2015  . MDD (major depressive disorder), recurrent episode, severe ([place]) 02/10/2014  . Nonspecific abnormal electrocardiogram (ECG) (EKG) 02/07/2014  . Non-compliant behavior 02/03/2014  . Morton's neuroma of left foot 08/07/2013  . Attention deficit disorder without mention of hyperactivity 03/13/2012  . Hyperglycemia 01/26/2012  . Hypogonadism male 01/24/2012  . Syncope and collapse 01/20/2012  .  OSA (obstructive sleep apnea) 01/17/2011  . CHEST TIGHTNESS 02/18/2008    Amrom Ore ,MS, CCC-SLP  01/02/2018, 10:32 AM  [facility] [address] [place], [place], [address] Phone: [phone]   Fax:  [phone]   Name: [name] [name] MRN: [id number] Date of Birth:  [birth year]

## 2018-01-02 NOTE — Patient Instructions (Signed)
Engage those abdominal muscles with your "ah"s! Get rid of that "gravelly" sound.

## 2018-01-04 ENCOUNTER — Ambulatory Visit: Payer: Medicare Other | Admitting: Physical Therapy

## 2018-01-04 ENCOUNTER — Encounter: Payer: Self-pay | Admitting: Physical Therapy

## 2018-01-04 ENCOUNTER — Ambulatory Visit: Payer: Medicare Other | Admitting: Occupational Therapy

## 2018-01-04 ENCOUNTER — Encounter: Payer: Self-pay | Admitting: Speech Pathology

## 2018-01-04 ENCOUNTER — Ambulatory Visit: Payer: Medicare Other | Admitting: Speech Pathology

## 2018-01-04 DIAGNOSIS — R29818 Other symptoms and signs involving the nervous system: Secondary | ICD-10-CM

## 2018-01-04 DIAGNOSIS — R41841 Cognitive communication deficit: Secondary | ICD-10-CM

## 2018-01-04 DIAGNOSIS — R41840 Attention and concentration deficit: Secondary | ICD-10-CM

## 2018-01-04 DIAGNOSIS — R278 Other lack of coordination: Secondary | ICD-10-CM | POA: Diagnosis not present

## 2018-01-04 DIAGNOSIS — R471 Dysarthria and anarthria: Secondary | ICD-10-CM

## 2018-01-04 DIAGNOSIS — R2689 Other abnormalities of gait and mobility: Secondary | ICD-10-CM | POA: Diagnosis not present

## 2018-01-04 DIAGNOSIS — R2681 Unsteadiness on feet: Secondary | ICD-10-CM

## 2018-01-04 DIAGNOSIS — R29898 Other symptoms and signs involving the musculoskeletal system: Secondary | ICD-10-CM

## 2018-01-04 NOTE — Therapy (Signed)
[facility] [name] [facility] [place], [place], [address] Phone: [phone]   Fax:  [phone]  Speech Language Pathology Treatment  Patient Details  Name: [name] [name] MRN: [id number] Date of Birth: 08-Jul-1951 Referring Provider: Alonza Bogus, DO   Encounter Date: 01/04/2018  End of Session - 01/04/18 1908    Visit Number  11    Number of Visits  17    Date for SLP Re-Evaluation  01/19/18    Authorization Type  75 total visits    Authorization - Visit Number  15    Authorization - Number of Visits  25    SLP Start Time  0930    SLP Stop Time   1012    SLP Time Calculation (min)  42 min    Activity Tolerance  Patient tolerated treatment well       Past Medical History:  Diagnosis Date  .  OSA (obstructive sleep apnea) 01/17/2011   npsg 2012:  AHI 67/hr. Auto titration 2012:  Optimal pressure 12cm.   . APPENDECTOMY, HX OF 02/18/2008   Qualifier: Diagnosis of  By: [place], [place]    . Cardiac murmur    as a child  . Depression   . Headache(784.0)   . HEADACHES, HX OF 02/18/2008   Qualifier: Diagnosis of  By: Danny Lawless CMA, [place]    . Parkinson disease ([place]) 11/2014  . Sinus mucosal thickening    pt unable to lay flat  . Streptococcal meningitis    as an infant    Past Surgical History:  Procedure Laterality Date  . APPENDECTOMY  1967  . NASAL SINUS SURGERY     x 4 as a child  . VASECTOMY      There were no vitals filed for this visit.  Subjective Assessment - 01/04/18 1903    Subjective  "I have to do my Power exercises"            ADULT SLP TREATMENT - 01/04/18 0939      General Information   Behavior/Cognition  Alert;Cooperative;Pleasant mood      Treatment Provided   Treatment provided  Cognitive-Linquistic      Cognitive-Linquistic Treatment   Treatment focused on  Dysarthria    Skilled Treatment  Pt recalled conversation with [name] from his last session. He recalled 3/4 teams  in the final four. In simple conversation, after I interjected a tangent, pt stated "I didn't get to finish my thought" and recalled what he wanted to say with extended time. Conversational volume ranged from upper 68dB to 70dB with rare min A when topic is relevant to pt with a lesser cognitive load. Pt educaated re: slow processing of conversation and of forming a response in conversation..      Assessment / Recommendations / Plan   Plan  Discharge SLP treatment due to (comment)      Progression Toward Goals   Progression toward goals  Goals met, education completed, patient discharged from [facility]  Visits from Start of Care: 11  Current functional level related to goals / functional outcomes: See goals below   Remaining deficits: Dysarthria; cognitive communication impairment  Education / Equipment: Compensations for dysarthria, memory, attention and conversation participation Plan: Patient agrees to discharge.  Patient goals were met. Patient is being discharged due to meeting the stated rehab goals.  ?????       SLP Short Term Goals - 01/04/18  [place] #1   Title  pt will demo working memory skills appropriate for recall of 60% of details    Time  1    Period  Weeks    Status  Achieved      SLP SHORT TERM GOAL #2   Title  pt will demo attention appropriate for 5 minutes simple conversation with modified independence (compensations)    Baseline  12/26/17    Time  1    Period  Weeks    Status  Achieved      SLP SHORT TERM GOAL #3   Title  Pt will utilize compensations for dysarthria in structured tasks with occasional min A in three sessions    Baseline  12/12/17, 12-14-17, 12/26/17    Status  Achieved       SLP Long Term Goals - 01/04/18 1907      SLP LONG TERM GOAL #1   Title  pt will demo attention skills adequate for functional verbal expression in 10 minutes simple-mod complex conversation over two  sessions    Time  3    Status  Achieved      SLP LONG TERM GOAL #2   Title  pt will complete functional working memory tasks with rare min A and compensatory measures    Time  3    Period  Weeks    Status  Achieved      SLP LONG TERM GOAL #3   Title  Pt will utilize compensations to be intelligible with cues from spouse at sentence level over 2 sessions per pt/spouse report    Baseline  01-02-18    Time  3    Period  Weeks    Status  On-going       Plan - 01/04/18 1913    Clinical Impression Statement  Pt has met long term goals. I recommend d/c from ST at this time, Pt in agreement. Pt to return for re-eval in 6 months per Parkinson's program protocol.       Patient will benefit from skilled therapeutic intervention in order to improve the following deficits and impairments:   Dysarthria  Cognitive communication deficit    Problem List Patient Active Problem List   Diagnosis Date Noted  . TMJ (temporomandibular joint syndrome) 10/10/2017  . Parkinson's disease ([place]) 03/20/2017  . Bilateral lower extremity edema 03/20/2017  . Chronic venous insufficiency 09/13/2016  . Fatigue 03/15/2016  . Parkinsonian features 09/15/2015  . ED (erectile dysfunction) 09/15/2015  . MDD (major depressive disorder), recurrent episode, severe ([place]) 02/10/2014  . Nonspecific abnormal electrocardiogram (ECG) (EKG) 02/07/2014  . Non-compliant behavior 02/03/2014  . Morton's neuroma of left foot 08/07/2013  . Attention deficit disorder without mention of hyperactivity 03/13/2012  . Hyperglycemia 01/26/2012  . Hypogonadism male 01/24/2012  . Syncope and collapse 01/20/2012  .  OSA (obstructive sleep apnea) 01/17/2011  . CHEST TIGHTNESS 02/18/2008    Lovvorn, Annye Rusk 01/04/2018, 7:14 PM  [facility] [address] [place], [place], [address] Phone: [phone]   Fax:  [phone]   Name: [name] [name] MRN:  [id number] Date of Birth: Jul 08, 1951

## 2018-01-04 NOTE — Therapy (Signed)
[facility] [address] [place], [place], [address] Phone: [phone]   Fax:  [phone]  Physical Therapy Treatment and Discharge Summary  Patient Details  Name: [name] [name] MRN: [id number] Date of Birth: 10/25/1950 Referring Provider: Dr. Carles Collet   Encounter Date: 01/04/2018  PT End of Session - 01/04/18 1050    Visit Number  12    Number of Visits  17    Date for PT Re-Evaluation  01/29/18    Authorization Type  [employer]-75 visit limit combined PT, OT, speech    Authorization - Visit Number  12    Authorization - Number of Visits  75 75 PT, OT, speech combined    PT Start Time  0800    PT Stop Time  0847    PT Time Calculation (min)  47 min    Activity Tolerance  Patient tolerated treatment well    Behavior During Therapy  [facility] for tasks assessed/performed       Past Medical History:  Diagnosis Date  .  OSA (obstructive sleep apnea) 01/17/2011   npsg 2012:  AHI 67/hr. Auto titration 2012:  Optimal pressure 12cm.   . APPENDECTOMY, HX OF 02/18/2008   Qualifier: Diagnosis of  By: [place], [place]    . Cardiac murmur    as a child  . Depression   . Headache(784.0)   . HEADACHES, HX OF 02/18/2008   Qualifier: Diagnosis of  By: Danny Lawless CMA, [place]    . Parkinson disease ([place]) 11/2014  . Sinus mucosal thickening    pt unable to lay flat  . Streptococcal meningitis    as an infant    Past Surgical History:  Procedure Laterality Date  . APPENDECTOMY  1967  . NASAL SINUS SURGERY     x 4 as a child  . VASECTOMY      There were no vitals filed for this visit.  Subjective Assessment - 01/04/18 0800    Subjective  Feeling dizzy today. "Spinning inside my head" Asking to set up his evaluation 6 months from now.    Patient Stated Goals  Pt's goals for therapy are to be doing exercise and to improve balance.    Currently in Pain?  No/denies    Pain Onset  --         Mini-BESTest: Balance  Evaluation Systems Test  2005-2013 [employer]. All rights reserved. ________________________________________________________________________________________Anticipatory_________Subscore__4___/6 1. SIT TO STAND Instruction: "Cross your arms across your chest. Try not to use your hands unless you must.Do not let your legs lean against the back of the chair when you stand. Please stand up now." x(2) Normal: Comes to stand without use of hands and stabilizes independently. (1) Moderate: Comes to stand WITH use of hands on first attempt. (0) Severe: Unable to stand up from chair without assistance, OR needs several attempts with use of hands. 2. RISE TO TOES Instruction: "Place your feet shoulder width apart. Place your hands on your hips. Try to rise as high as you can onto your toes. I will count out loud to 3 seconds. Try to hold this pose for at least 3 seconds. Look straight ahead. Rise now." (2) Normal: Stable for 3 s with maximum height. x(1) Moderate: Heels up, but not full range (smaller than when holding hands), OR noticeable instability for 3 s. (0) Severe: < 3 s. 3. STAND ON ONE LEG Instruction: "Look straight ahead. Keep your hands on your hips. Lift your  leg off of the ground behind you without touching or resting your raised leg upon your other standing leg. Stay standing on one leg as long as you can. Look straight ahead. Lift now." Left: Time in Seconds Trial 1:__0.91___Trial 2:__1.19___ (2) Normal: 20 s. x(1) Moderate: < 20 s. (0) Severe: Unable. Right: Time in Seconds Trial 1:__0.75___Trial 2:___1.09__ (2) Normal: 20 s. x(1) Moderate: < 20 s. (0) Severe: Unable To score each side separately use the trial with the longest time. To calculate the sub-score and total score use the side [left or right] with the lowest numerical score [i.e. the worse side]. ______________________________________________________________________________________Reactive  Postural Control___________Subscore:__0___/6 4. COMPENSATORY STEPPING CORRECTION- FORWARD Instruction: "Stand with your feet shoulder width apart, arms at your sides. Lean forward against my hands beyond your forward limits. When I let go, do whatever is necessary, including taking a step, to avoid a fall." (2) Normal: Recovers independently with a single, large step (second realignment step is allowed). (1) Moderate: More than one step used to recover equilibrium. x(0) Severe: No step, OR would fall if not caught, OR falls spontaneously. 5. COMPENSATORY STEPPING CORRECTION- BACKWARD Instruction: "Stand with your feet shoulder width apart, arms at your sides. Lean backward against my hands beyond your backward limits. When I let go, do whatever is necessary, including taking a step, to avoid a fall." (2) Normal: Recovers independently with a single, large step. (1) Moderate: More than one step used to recover equilibrium. x(0) Severe: No step, OR would fall if not caught, OR falls spontaneously. 6. COMPENSATORY STEPPING CORRECTION- LATERAL Instruction: "Stand with your feet together, arms down at your sides. Lean into my hand beyond your sideways limit. When I let go, do whatever is necessary, including taking a step, to avoid a fall." Left (2) Normal: Recovers independently with 1 step (crossover or lateral OK). (1) Moderate: Several steps to recover equilibrium. x(0) Severe: Falls, or cannot step. Right (2) Normal: Recovers independently with 1 step (crossover or lateral OK). (1) Moderate: Several steps to recover equilibrium. x(0) Severe: Falls, or cannot step. Use the side with the lowest score to calculate sub-score and total score. ____________________________________________________________________________________Sensory Orientation_____________Subscore:____6_____/6 7. STANCE (FEET TOGETHER); EYES OPEN, FIRM SURFACE Instruction: "Place your hands on your hips. Place your feet  together until almost touching. Look straight ahead. Be as stable and still as possible, until I say stop." Time in seconds:________ x(2) Normal: 30 s. (1) Moderate: < 30 s. (0) Severe: Unable. 8. STANCE (FEET TOGETHER); EYES CLOSED, FOAM SURFACE Instruction: "Step onto the foam. Place your hands on your hips. Place your feet together until almost touching. Be as stable and still as possible, until I say stop. I will start timing when you close your eyes." Time in seconds:________ x(2) Normal: 30 s. (1) Moderate: < 30 s. (0) Severe: Unable. 9. INCLINE- EYES CLOSED Instruction: "Step onto the incline ramp. Please stand on the incline ramp with your toes toward the top. Place your feet shoulder width apart and have your arms down at your sides. I will start timing when you close your eyes." Time in seconds:________ x(2) Normal: Stands independently 30 s and aligns with gravity. (1) Moderate: Stands independently <30 s OR aligns with surface. (0) Severe: Unable. _________________________________________________________________________________________Dynamic Gait ______Subscore_____7___/10 10. CHANGE IN GAIT SPEED Instruction: "Begin walking at your normal speed, when I tell you 'fast', walk as fast as you can. When I say 'slow', walk very slowly." x(2) Normal: Significantly changes walking speed without imbalance. (1) Moderate: Unable to change walking speed  or signs of imbalance. (0) Severe: Unable to achieve significant change in walking speed AND signs of imbalance. [place] - HORIZONTAL Instruction: "Begin walking at your normal speed, when I say "right", turn your head and look to the right. When I say "left" turn your head and look to the left. Try to keep yourself walking in a straight line." (2) Normal: performs head turns with no change in gait speed and good balance. x(1) Moderate: performs head turns with reduction in gait speed. (0) Severe: performs head turns  with imbalance. 12. WALK WITH PIVOT TURNS Instruction: "Begin walking at your normal speed. When I tell you to 'turn and stop', turn as quickly as you can, face the opposite direction, and stop. After the turn, your feet should be close together." (2) Normal: Turns with feet close FAST (< 3 steps) with good balance. x(1) Moderate: Turns with feet close SLOW (>4 steps) with good balance. (0) Severe: Cannot turn with feet close at any speed without imbalance. 13. STEP OVER OBSTACLES Instruction: "Begin walking at your normal speed. When you get to the box, step over it, not around it and keep walking." x(2) Normal: Able to step over box with minimal change of gait speed and with good balance. (1) Moderate: Steps over box but touches box OR displays cautious behavior by slowing gait. (0) Severe: Unable to step over box OR steps around box. 14. TIMED UP & GO WITH DUAL TASK [3 METER WALK] Instruction TUG: "When I say 'Go', stand up from chair, walk at your normal speed across the tape on the floor, turn around, and come back to sit in the chair." Instruction TUG with Dual Task: "Count backwards by threes starting at ___. When I say 'Go', stand up from chair, walk at your normal speed across the tape on the floor, turn around, and come back to sit in the chair. Continue counting backwards the entire time." TUG: ___14.6_____seconds; Dual Task TUG: _____23.3___seconds (2) Normal: No noticeable change in sitting, standing or walking while backward counting when compared to TUG without Dual Task. x(1) Moderate: Dual Task affects either counting OR walking (>10%) when compared to the TUG without Dual Task. (0) Severe: Stops counting while walking OR stops walking while counting. When scoring item 14, if subject's gait speed slows more than 10% between the TUG without and with a Dual Task the score should be decreased by a point. TOTAL SCORE: ___17_____/28   [facility] PT Assessment - 01/04/18 9233       Timed Up and Go Test   Normal TUG (seconds)  14.6    Cognitive TUG (seconds)  23.3                   [facility] Adult PT Treatment/Exercise - 01/04/18 0808      Transfers   Five time sit to stand comments   8.94      Ambulation/Gait   Ambulation/Gait Assistance  6: Modified independent (Device/Increase time)    Ambulation/Gait Assistance Details  VCs to increase step length and arm swing    Ambulation Distance (Feet)  240 Feet    Assistive device  None    Gait Pattern  Step-through pattern;Decreased arm swing - right;Decreased arm swing - left;Decreased step length - right;Decreased step length - left;Decreased trunk rotation;Trunk flexed;Poor foot clearance - left;Poor foot clearance - right    Ambulation Surface  Indoor;Level    Gait velocity  32.8/15.56=2.10 ft/sec    Gait Comments  lack  of time to assess gait outdoors      Knee/Hip Exercises: Aerobic   Other Aerobic  Sci fit stepper L4.0 x 10 minutes, cues to keep RPM >70 for increased intensity as warm up at initiation of PT session.            PT Education - 01/04/18 1049    Education Details  results of LTG assessment    Person(s) Educated  Patient    Methods  Explanation    Comprehension  Verbalized understanding       PT Short Term Goals - 12/19/17 0850      PT SHORT TERM GOAL #1   Title  Pt will be independent with HEP to target Parkinson's specific deficits.  TARGET for all STGS 12/22/17    Time  4    Period  Weeks    Status  Achieved      PT SHORT TERM GOAL #2   Title  Pt will improve TUG score to less than or equal to 13.5 seconds for decreased fall risk.    Baseline  12/19/17  12.44 sec    Time  4    Period  Weeks    Status  Achieved      PT SHORT TERM GOAL #3   Title  Pt will perform sit<>stand transfers at least 8 of 10 reps with no hesitation and posterior lean.    Baseline  12/19/17    Time  4    Period  Weeks    Status  Achieved      PT SHORT TERM GOAL #4   Title  Pt will improve  MiniBESTest score to at least 15/28 for decreased fall risk.    Baseline  12/19/17  15/28    Time  4    Period  Weeks    Status  Achieved      PT SHORT TERM GOAL #5   Title  Pt will improve ABC scale score by at least 10% to demonstrate improved balance confidence with daily activities.    Baseline  Eval:  57.5%  12/19/17  57.5%    Time  4    Period  Weeks    Status  Not Met        PT Long Term Goals - 01/04/18 1056      PT LONG TERM GOAL #1   Title  Pt will verbalize understanding of fall prevention/tips to reduce freezing with gait.  UPDATED TARGET for all LTGs 01/19/18    Baseline  01/04/18 Pt unable to state tips to reduce freezing (even with visual prompts/cues)     Time  8    Period  Weeks    Status  Not Met      PT LONG TERM GOAL #2   Title  Pt will improve TUG cognitive to less than or equal to 15 seconds for decreased fall risk/improved dual tasking with gait.    Baseline  01/04/18  23.3 sec (compared to 15.69)    Time  8    Period  Weeks    Status  Not Met      PT LONG TERM GOAL #3   Title  Pt will improve MiniBESTest score to at least 19/28 for decreased fall risk.    Baseline  01/04/18  17/19 (compared to 15/19)    Time  8    Period  Weeks    Status  Partially Met      PT LONG TERM GOAL #4  Title  Pt will improve gait velocity to at least 2 ft/sec for improved gait efficiency and safety in community    Baseline  01/04/18 2.1 ft/sec    Time  8    Period  Weeks    Status  Achieved      PT LONG TERM GOAL #5   Title  Pt will ambulate at least 1000 ft, indoor and outdoor surfaces, including ramps and inclines, with least restrictive assistive device modified independently for improved safety with outdoor gait.    Baseline  01/04/18 supervision for freezing stepping onto ramp    Time  8    Period  Weeks    Status  Partially Met            Plan - 01/04/18 1220    Clinical Impression Statement  Patient seen for discharge visit with LTGs assessed. Patient met 1 of  5 goals; partially met 2 of 5 goals (improved but not to goal level); and did not meet 2 of 5 goals. Patient has been noted to have worsening of his memory and despite multiple sessions repeating and practicing information related to avoid freezing gait, he was unable to verbalize or demonstrate any techniques. Wife was not present during session for caregiver education for carryover. Encouraged patient to attend PWR moves classes to help maintain the gains he has made with patient not committing to this plan. Patient is discharged from PT at this time with plan for evaluation in 6 months.     Rehab Potential  Good    Clinical Impairments Affecting Rehab Potential  Memory deficits per speech eval notes    PT Frequency  2x / week    PT Duration  8 weeks plus eval; recert 2/37/62 to cover POC    PT Treatment/Interventions  ADLs/Self Care Home Management;DME Instruction;Gait training;Stair training;Functional mobility training;Therapeutic activities;Therapeutic exercise;Balance training;Neuromuscular re-education;Patient/family education    Consulted and Agree with Plan of Care  Patient       Patient will benefit from skilled therapeutic intervention in order to improve the following deficits and impairments:  Abnormal gait, Decreased balance, Decreased mobility, Difficulty walking, Postural dysfunction  Visit Diagnosis: Other symptoms and signs involving the nervous system  Unsteadiness on feet  Other abnormalities of gait and mobility     Problem List Patient Active Problem List   Diagnosis Date Noted  . TMJ (temporomandibular joint syndrome) 10/10/2017  . Parkinson's disease ([place]) 03/20/2017  . Bilateral lower extremity edema 03/20/2017  . Chronic venous insufficiency 09/13/2016  . Fatigue 03/15/2016  . Parkinsonian features 09/15/2015  . ED (erectile dysfunction) 09/15/2015  . MDD (major depressive disorder), recurrent episode, severe ([place]) 02/10/2014  . Nonspecific abnormal  electrocardiogram (ECG) (EKG) 02/07/2014  . Non-compliant behavior 02/03/2014  . Morton's neuroma of left foot 08/07/2013  . 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 Hyperglycemia 01/26/2012  . Hypogonadism male 01/24/2012  . Syncope and collapse 01/20/2012  .  OSA (obstructive sleep apnea) 01/17/2011  . CHEST TIGHTNESS 02/18/2008    PHYSICAL THERAPY DISCHARGE SUMMARY  Visits from Start of Care: 12  Current functional level related to goals / functional outcomes: See long-term goals above    Remaining deficits: Festinating gait with freezing, declining memory   Education / Equipment: For HEP  Plan: Patient agrees to discharge.  Patient goals were partially met. Patient is being discharged due to being pleased with the current functional level.  ?????        Rexanne Mano,  PT 01/04/2018, 12:28 PM  [facility] [address] [place], [place], [address] Phone: [phone]   Fax:  [phone]  Name: [name] [name] MRN: [id number] Date of Birth: 1951/06/21

## 2018-01-04 NOTE — Patient Instructions (Signed)
   Slower processing of conversation  You are trying to think about, or process, what the other person says and figure out what you are going say in response  People expect a response in 2 seconds, if they don't get it, they will continue talking or ask you a follow up question, which then changes the response you just thought of  You may educate your friends and family to give you more time to respond, especially if they would like you to participate in group conversation  Keep up your loud "AH" and doing games, cards, trivia, crosswords for your cognition

## 2018-01-04 NOTE — Therapy (Addendum)
[facility] [address] [place], [place], [address] Phone: [phone]   Fax:  [phone]  Occupational Therapy Treatment  Patient Details  Name: [name] [name] MRN: [id number] Date of Birth: [birth year] Referring Provider: Dr. Carles Collet   Encounter Date: 01/04/2018  OT End of Session - 01/04/18 1217    Visit Number  13    Number of Visits  17    Date for OT Re-Evaluation  02/03/18    Authorization Type  As of 12/01/17, Medicare primary/BCBS secondary    Authorization Time Period  75 visit limit combined for [employer]; cert date 05/08/91-06/05/43    Authorization - Visit Number  12    Authorization - Number of Visits  25    OT Start Time  0845    OT Stop Time  0934    OT Time Calculation (min)  49 min    Activity Tolerance  Patient tolerated treatment well    Behavior During Therapy  [facility] for tasks assessed/performed       Past Medical History:  Diagnosis Date  .  OSA (obstructive sleep apnea) 01/17/2011   npsg 2012:  AHI 67/hr. Auto titration 2012:  Optimal pressure 12cm.   . APPENDECTOMY, HX OF 02/18/2008   Qualifier: Diagnosis of  By: [place], [place]    . Cardiac murmur    as a child  . Depression   . Headache(784.0)   . HEADACHES, HX OF 02/18/2008   Qualifier: Diagnosis of  By: Danny Lawless CMA, [place]    . Parkinson disease ([place]) 11/2014  . Sinus mucosal thickening    pt unable to lay flat  . Streptococcal meningitis    as an infant    Past Surgical History:  Procedure Laterality Date  . APPENDECTOMY  1967  . NASAL SINUS SURGERY     x 4 as a child  . VASECTOMY      There were no vitals filed for this visit.  Subjective Assessment - 01/04/18 1212    Subjective   Pt requests eval in  6 mons    Patient Stated Goals  improved balance and ease with ADLs    Currently in Pain?  Yes    Pain Score  3     Pain Location  Back    Pain Type  Acute pain    Pain Onset  In the past 7 days    Pain Frequency   Intermittent    Aggravating Factors   pain with standing    Pain Relieving Factors  unsure          Treatment: Therapist asked pt to ask his wife to come in for therapy as it is his last visit.  Pt called his wife and asked her to come in. She arrived while pt was riding the arm bike with his headphones on listening to music (5 mins level 1 for conditioning pt maintained 40 rpm). Therapist told pt's wife that she had requested she come in for therapy as this is the last visit for OT and it could help with carryover at home. Pt's wife expressed anger stating "I am very involved, I see all the exercises you send home, I am his wife not his mother and he is a grown man, he's not helpless and I do not nag!" Therapist explained that due to the cognitive changes with PD it is helpful to have family involvement . Pt's wife stated" I am very  involved and this was a wrong  asumption on your part". Therapist apologized and stated that she was not aware of her involvement/ awareness of pt exercises since she had not met with her during this course of treatment. Pt' wife chose to leave. Pt had on head phones during this interaction, he asked if everything was okay, therapist let pt know that his wife had chosen to leave and that she appeared upset about being asked to come in. Therapist told pt that she had only asked his wife to come in to help him with carryover. Pt said everything is okay, he did not appear in distress and he proceeded with the treatment. Therapist checked progress towards goals. See goals for update. Dynamic step and reach, min v.c for large amplitude movements. Fine motor coordination copying small peg design with LUE min difficulty v.c Pt requested to schedule an OT evaluation in 6 mons.                   OT Short Term Goals - 01/04/18 0904      OT SHORT TERM GOAL #1   Title  Pt will be independent with PD specific  HEP.--    Time  4    Period  Weeks    Status   Achieved      OT SHORT TERM GOAL #2   Title  Pt will verbalize understanding of ways to prevent future complications and current available/appropriate community resources prn.    Time  4    Status  Achieved      OT SHORT TERM GOAL #3   Title  Pt will improve bilateral hand coordination as shown by fastening/unfastening 3 buttons in 50sec or less.    Baseline  57.34    Time  4    Period  Weeks    Status  Not Met 56.44 secs      OT SHORT TERM GOAL #4   Title  Pt will report incr ease with donning pants socks/shoes using adaptive strategies/AE prn.    Time  4    Status  Achieved      OT SHORT TERM GOAL #5   Title  Pt will demonstrate ability to write a 3 sentences with 100% legibility and only min decrease in letter size.    Status  Achieved        OT Long Term Goals - 01/04/18 0902      OT LONG TERM GOAL #1   Title  Pt will verbalize understanding of adapted strategies for ADLs/IADLs--    Time  8    Period  Weeks    Status  Achieved      OT LONG TERM GOAL #2   Title  Pt will demonstrate improved functional standing balance as evidenced by increasing bilateral  standing functional reach to 10 inches or greater    Baseline  RUE 9.5 inches, LUE 8.5 in    Time  8    Period  Weeks    Status  Achieved      OT LONG TERM GOAL #3   Title  Pt will improve bilateral hand coordination as shown by fastening/unfastening 3 buttons in 45sec or less.    Time  8    Period  Weeks    Status  Not Met            Plan - 01/04/18 1217    Clinical Impression Statement  Pt demonstrates good overall progress, he agrees with plans for d/c.    Occupational Profile and  client history currently impacting functional performance  PMH that includes: seeing illusions, hx of falls, hx of significant depression, mild cognitive impairment, sleep apnea, hypogonadism, ADD. Pt retired due to difficulty at work and no longer drives. Pt reports slowing of ADLs and incr cognitive deficits impacting  function.     Rehab Potential  Good    Current Impairments/barriers affecting progress:  cognitive deficits, depression    OT Frequency  2x / week    OT Duration  8 weeks    OT Treatment/Interventions  Self-care/ADL training;Moist Heat;Fluidtherapy;Balance training;Therapeutic activities;Cognitive remediation/compensation;Therapeutic exercise;Ultrasound;Cryotherapy;Neuromuscular education;Visual/perceptual remediation/compensation;Passive range of motion;Functional Mobility Training;Patient/family education;Manual Therapy;Energy conservation;Paraffin    Plan  d/c OT, eval in 6 mons    Consulted and Agree with Plan of Care  Patient       Patient will benefit from skilled therapeutic intervention in order to improve the following deficits and impairments:  Decreased cognition, Impaired flexibility, Decreased mobility, Decreased coordination, Decreased endurance, Decreased range of motion, Decreased strength, Impaired UE functional use, Impaired tone, Impaired perceived functional ability, Decreased safety awareness, Difficulty walking, Decreased balance, Decreased activity tolerance, Impaired vision/preception, Improper spinal/pelvic alignment  Visit Diagnosis: Other lack of coordination  Attention and concentration deficit  Other symptoms and signs involving the musculoskeletal system  Other symptoms and signs involving the nervous system  Unsteadiness on feet  Other abnormalities of gait and mobility OCCUPATIONAL THERAPY DISCHARGE SUMMARY    Current functional level related to goals / functional outcomes: Pt made overall progress towards goals.    Remaining deficits: Decreased coordination, bradykinesia, rigidity, decreased balance, cognitive deficits   Education / Equipment: Pt was educated regarding adapted strategies for ADLS and HEP. Pt verbalizes understanding of all education.  Therapist attempted to perfom education with pt's wife on his last visit for increased carryover  at home, however she was not receptive.(see above progress note).  Plan: Patient agrees to discharge.  Patient goals were partially met. Patient is being discharged due to being pleased with the current functional level.  ?????       Problem List Patient Active Problem List   Diagnosis Date Noted  . TMJ (temporomandibular joint syndrome) 10/10/2017  . Parkinson's disease ([place]) 03/20/2017  . Bilateral lower extremity edema 03/20/2017  . Chronic venous insufficiency 09/13/2016  . Fatigue 03/15/2016  . Parkinsonian features 09/15/2015  . ED (erectile dysfunction) 09/15/2015  . MDD (major depressive disorder), recurrent episode, severe ([place]) 02/10/2014  . Nonspecific abnormal electrocardiogram (ECG) (EKG) 02/07/2014  . Non-compliant behavior 02/03/2014  . Morton's neuroma of left foot 08/07/2013  . Attention deficit disorder without mention of hyperactivity 03/13/2012  . Hyperglycemia 01/26/2012  . Hypogonadism male 01/24/2012  . Syncope and collapse 01/20/2012  .  OSA (obstructive sleep apnea) 01/17/2011  . CHEST TIGHTNESS 02/18/2008    Sinjin Amero 01/04/2018, 12:52 PM Theone Murdoch, OTR/L [phone] Phone: [phone] 12:56 PM 01/04/18 [facility] [address] [place], [place], [address] Phone: [phone]   Fax:  [phone]  Name: LORETTA KLUENDER MRN: [id number] Date of Birth: Jul 26, 1951

## 2018-01-09 DIAGNOSIS — N3941 Urge incontinence: Secondary | ICD-10-CM | POA: Diagnosis not present

## 2018-01-09 DIAGNOSIS — R350 Frequency of micturition: Secondary | ICD-10-CM | POA: Diagnosis not present

## 2018-01-15 ENCOUNTER — Ambulatory Visit: Payer: Medicare Other | Admitting: Internal Medicine

## 2018-01-15 ENCOUNTER — Encounter: Payer: Self-pay | Admitting: Internal Medicine

## 2018-01-15 VITALS — BP 146/83 | HR 70 | Temp 99.1°F | Ht 74.0 in | Wt 300.0 lb

## 2018-01-15 MED ORDER — SODIUM CHLORIDE 0.9 % IV SOLN: 500.0000 mL | Freq: Once | INTRAVENOUS | Status: DC

## 2018-01-15 NOTE — Progress Notes (Signed)
During admission, client said stool was still solid. MD notified and came to talk with patient. Patient is rescheduled for tomorrow after doing additional prep. Patient and care partner understand with no further questions.

## 2018-01-16 ENCOUNTER — Ambulatory Visit (INDEPENDENT_AMBULATORY_CARE_PROVIDER_SITE_OTHER): Payer: Medicare Other | Admitting: Psychology

## 2018-01-16 ENCOUNTER — Ambulatory Visit: Payer: Medicare Other | Admitting: Gastroenterology

## 2018-01-16 DIAGNOSIS — K921 Melena: Secondary | ICD-10-CM

## 2018-01-16 DIAGNOSIS — F4323 Adjustment disorder with mixed anxiety and depressed mood: Secondary | ICD-10-CM | POA: Diagnosis not present

## 2018-01-16 NOTE — Progress Notes (Signed)
I observed [name] [name] stool just as he arrived in Admitting. The stool was dark brown, thick liquid and unable to see through. We spoke with Dr. Ardis Hughs who advised that we schedule an appointment with either him or Pyrtle ASAP to discuss next steps.

## 2018-01-16 NOTE — Progress Notes (Signed)
Prep still poor after two day prep.  Will plan on NGI appt in our office to discuss his situation consider other ways to get him ready for a colonoscopy  (may need several days prep, perhaps hosp admission)/

## 2018-01-25 DIAGNOSIS — R3915 Urgency of urination: Secondary | ICD-10-CM | POA: Diagnosis not present

## 2018-01-25 DIAGNOSIS — R350 Frequency of micturition: Secondary | ICD-10-CM | POA: Diagnosis not present

## 2018-01-25 DIAGNOSIS — N3941 Urge incontinence: Secondary | ICD-10-CM | POA: Diagnosis not present

## 2018-01-30 ENCOUNTER — Ambulatory Visit (INDEPENDENT_AMBULATORY_CARE_PROVIDER_SITE_OTHER): Payer: Medicare Other | Admitting: Psychology

## 2018-01-30 DIAGNOSIS — F4323 Adjustment disorder with mixed anxiety and depressed mood: Secondary | ICD-10-CM

## 2018-01-30 DIAGNOSIS — R350 Frequency of micturition: Secondary | ICD-10-CM | POA: Diagnosis not present

## 2018-01-31 ENCOUNTER — Ambulatory Visit (INDEPENDENT_AMBULATORY_CARE_PROVIDER_SITE_OTHER): Payer: Medicare Other | Admitting: Internal Medicine

## 2018-01-31 ENCOUNTER — Encounter: Payer: Self-pay | Admitting: Internal Medicine

## 2018-01-31 VITALS — BP 104/70 | HR 76 | Ht 74.0 in | Wt 289.0 lb

## 2018-01-31 DIAGNOSIS — Z1211 Encounter for screening for malignant neoplasm of colon: Secondary | ICD-10-CM | POA: Diagnosis not present

## 2018-01-31 DIAGNOSIS — K5900 Constipation, unspecified: Secondary | ICD-10-CM | POA: Diagnosis not present

## 2018-01-31 DIAGNOSIS — K625 Hemorrhage of anus and rectum: Secondary | ICD-10-CM | POA: Diagnosis not present

## 2018-01-31 NOTE — Patient Instructions (Signed)
You have been scheduled for a colonoscopy. Please follow written instructions given to you at your visit today.  Please pick up your prep supplies at the pharmacy within the next 1-3 days. If you use inhalers (even only as needed), please bring them with you on the day of your procedure. Your physician has requested that you go to www.startemmi.com and enter the access code given to you at your visit today. This web site gives a general overview about your procedure. However, you should still follow specific instructions given to you by our office regarding your preparation for the procedure.  If you are age 49 or older, your body mass index should be between 23-30. Your Body mass index is 37.11 kg/m. If this is out of the aforementioned range listed, please consider follow up with your Primary Care Provider.  If you are age 44 or younger, your body mass index should be between 19-25. Your Body mass index is 37.11 kg/m. If this is out of the aformentioned range listed, please consider follow up with your Primary Care Provider.

## 2018-01-31 NOTE — Progress Notes (Signed)
Patient ID: [name], male   DOB: 1951/08/22, 67 y.o.   MRN: [id number] HPI: Sopheap Boehle is a 67 year old male with a past medical history of Parkinson's disease, sleep apnea, headaches, depression who is seen in consultation at the request of Dr. Sharlet Salina to evaluate rectal bleeding and constipation.  He is here today with his wife.  He came for direct screening colonoscopy in for evaluation of rectal bleeding however on that day the prep was ineffective and he was rescheduled for the next day with additional prep.  Despite an additional bowel preparation when he came the next day he was still not adequately prepared and thus office visit was scheduled.  He reports that he has had lifelong fairly irregular bowel movements occurring every 3 to 4 days.  He did not feel that this was constipated or giving him any particular abdominal symptoms.  Since the prep he has felt more constipation and irregular bowel habits.  Within the last couple of months he saw painless bright red blood in the toilet after bowel movement and with wiping.  This has not recurred recently.  He denies melena.  Again no abdominal pain, good appetite.  No nausea or vomiting.  Occasional indigestion symptoms treated with Tums.  No dysphagia or odynophagia.  Since trying bowel preparation he has felt more constipated and went over 7 days without bowel movement.  His wife treated him with mag citrate x2 which did seem to help a little bit.  No family history of colon cancer.  No prior colonoscopy  Past Medical History:  Diagnosis Date  .  OSA (obstructive sleep apnea) 01/17/2011   npsg 2012:  AHI 67/hr. Auto titration 2012:  Optimal pressure 12cm.   . APPENDECTOMY, HX OF 02/18/2008   Qualifier: Diagnosis of  By: [place], [place]    . Cardiac murmur    as a child  . Depression   . Headache(784.0)   . HEADACHES, HX OF 02/18/2008   Qualifier: Diagnosis of  By: Danny Lawless CMA, [place]    . Parkinson disease ([place]) 11/2014   . Sinus mucosal thickening    pt unable to lay flat  . Streptococcal meningitis    as an infant    Past Surgical History:  Procedure Laterality Date  . APPENDECTOMY  1967  . NASAL SINUS SURGERY     x 4 as a child  . VASECTOMY      Outpatient Medications Prior to Visit  Medication Sig Dispense Refill  . B Complex-C (B-COMPLEX WITH VITAMIN C) tablet Take 1 tablet by mouth daily.    Marland Kitchen buPROPion (WELLBUTRIN XL) 300 MG 24 hr tablet TAKE 1 TABLET BY MOUTH EVERY DAY IN THE MORNING 90 tablet 0  . carbidopa-levodopa (SINEMET IR) 25-100 MG tablet TAKE 1 TABLET BY MOUTH THREE TIMES A DAY 270 tablet 1  . citalopram (CELEXA) 40 MG tablet Take 1 tablet (40 mg total) by mouth daily. 90 tablet 0  . fesoterodine (TOVIAZ) 4 MG TB24 tablet Take 1 tablet (4 mg total) by mouth daily. 30 tablet 0  . Omega 3-6-9 Fatty Acids (OMEGA 3-6-9 PO) Take by mouth.    Marland Kitchen rOPINIRole (REQUIP) 2 MG tablet Take 1 tablet (2 mg total) by mouth 3 (three) times daily. 270 tablet 1  . sildenafil (VIAGRA) 100 MG tablet Take 0.5-1 tablets (50-100 mg total) by mouth daily as needed for erectile dysfunction. 10 tablet 2  . TURMERIC CURCUMIN PO Take by mouth.    . Na Sulfate-K Sulfate-Mg  Sulf 17.5-3.13-1.6 GM/177ML SOLN Suprep 1 kit (no substitutions)-TAKE AS DIRECTED. 2 Bottle 0   Facility-Administered Medications Prior to Visit  Medication Dose Route Frequency Provider Last Rate Last Dose  . 0.9 %  sodium chloride infusion  500 mL Intravenous Once Maanya Hippert, Lajuan Lines, MD        No Known Allergies  Family History  Problem Relation Age of Onset  . Lung cancer Father   . Alcohol abuse Brother   . Drug abuse Brother   . Seizures Daughter   . Colon cancer Neg Hx     Social History   Tobacco Use  . Smoking status: Never Smoker  . Smokeless tobacco: Never Used  Substance Use Topics  . Alcohol use: No    Alcohol/week: 0.0 oz  . Drug use: No    ROS: As per history of present illness, otherwise negative  BP 104/70  (Cuff Size: Large)   Pulse 76   Ht '6\' 2"'$  (1.88 m)   Wt 289 lb (131.1 kg)   BMI 37.11 kg/m  Constitutional: Well-developed and well-nourished. No distress. HEENT: Normocephalic and atraumatic. Oropharynx is clear and moist. Conjunctivae are normal.  No scleral icterus. Neck: Neck supple. Trachea midline. Cardiovascular: Normal rate, regular rhythm and intact distal pulses.  Pulmonary/chest: Effort normal and breath sounds normal. No wheezing, rales or rhonchi. Abdominal: Soft, nontender, nondistended. Bowel sounds active throughout.  Rectal: No masses, normal external exam, hard stool in the rectal vault Extremities: no clubbing, cyanosis, trace pretibial edema Neurological: Alert and oriented to person place and time. Skin: Skin is warm and dry.  Psychiatric: Normal mood and affect. Behavior is normal.  RELEVANT LABS AND IMAGING: CBC    Component Value Date/Time   WBC 5.7 10/09/2017 1138   RBC 4.38 10/09/2017 1138   HGB 13.4 10/09/2017 1138   HCT 40.5 10/09/2017 1138   PLT 276.0 10/09/2017 1138   MCV 92.6 10/09/2017 1138   MCHC 33.1 10/09/2017 1138   RDW 13.8 10/09/2017 1138   LYMPHSABS 1.4 02/06/2014 1512   MONOABS 0.4 02/06/2014 1512   EOSABS 0.4 02/06/2014 1512   BASOSABS 0.0 02/06/2014 1512    CMP     Component Value Date/Time   NA 138 10/09/2017 1138   K 4.3 10/09/2017 1138   CL 104 10/09/2017 1138   CO2 28 10/09/2017 1138   GLUCOSE 113 (H) 10/09/2017 1138   BUN 16 10/09/2017 1138   CREATININE 1.14 10/09/2017 1138   CALCIUM 9.3 10/09/2017 1138   PROT 7.2 10/09/2017 1138   ALBUMIN 4.3 10/09/2017 1138   AST 13 10/09/2017 1138   ALT 21 10/09/2017 1138   ALKPHOS 66 10/09/2017 1138   BILITOT 0.6 10/09/2017 1138   GFRNONAA 61 10/25/2006 1101   GFRAA 74 10/25/2006 1101    ASSESSMENT/PLAN:  67 year old male with a past medical history of Parkinson's disease, sleep apnea, headaches, depression who is seen in consultation at the request of Dr. Sharlet Salina to evaluate  rectal bleeding and constipation.  1.  Colon cancer screening/rectal bleeding/constipation --I recommended colonoscopy for him but we will have to plan a 2-day bowel preparation.  I also think treating his constipation will likely improve his bowel regularity and make the bowel preparation more tolerable and more effective for him.  I am starting him on Linzess 290 mcg daily 30 minutes before breakfast.  He is advised of the side effects and to notify me should he develop diarrhea or abdominal pain with this medication.  We will have him do  a 2-day bowel prep with Plenvu x 2 (samples given).  We discussed the risk, benefits and alternatives of repeating colonoscopy and he is agreeable and wishes to proceed.  If the prep is again poor or suboptimal then we should consider CT colonoscopy.      QD:IYME[facility]RA[employer], [address], [place], [place] [id number]

## 2018-02-01 ENCOUNTER — Telehealth: Payer: Self-pay | Admitting: Internal Medicine

## 2018-02-01 MED ORDER — LINACLOTIDE 290 MCG PO CAPS: 290.0000 ug | ORAL_CAPSULE | Freq: Every day | ORAL | 5 refills | Status: DC

## 2018-02-01 NOTE — Telephone Encounter (Signed)
Rx sent 

## 2018-02-05 ENCOUNTER — Telehealth: Payer: Self-pay | Admitting: *Deleted

## 2018-02-05 NOTE — Telephone Encounter (Signed)
Patient's insurance, [employer] has denied patient's Linzess 290 mcg. "The indicated use of this medication does not meet the service benefit plan's criteria for medical necessity due to the following reason: the diagnosis of unspecified constipation does not establish medical necessity for this drug."  Dr Hilarie Fredrickson, any other suggestions?

## 2018-02-06 ENCOUNTER — Encounter: Payer: Self-pay | Admitting: *Deleted

## 2018-02-06 DIAGNOSIS — R350 Frequency of micturition: Secondary | ICD-10-CM | POA: Diagnosis not present

## 2018-02-06 NOTE — Telephone Encounter (Signed)
Prior auth again attempted with updated diagnosis of chronic idiopathic constipation. We will await insurance response.

## 2018-02-06 NOTE — Telephone Encounter (Signed)
Would try again with [facility]

## 2018-02-07 ENCOUNTER — Other Ambulatory Visit: Payer: Self-pay

## 2018-02-07 ENCOUNTER — Ambulatory Visit (AMBULATORY_SURGERY_CENTER): Payer: Medicare Other | Admitting: Internal Medicine

## 2018-02-07 ENCOUNTER — Encounter: Payer: Self-pay | Admitting: Internal Medicine

## 2018-02-07 VITALS — BP 129/77 | HR 67 | Temp 97.8°F | Resp 19 | Ht 74.0 in | Wt 289.0 lb

## 2018-02-07 DIAGNOSIS — Z1211 Encounter for screening for malignant neoplasm of colon: Secondary | ICD-10-CM

## 2018-02-07 DIAGNOSIS — G20 Parkinson's disease: Secondary | ICD-10-CM | POA: Diagnosis not present

## 2018-02-07 DIAGNOSIS — K635 Polyp of colon: Secondary | ICD-10-CM | POA: Diagnosis not present

## 2018-02-07 DIAGNOSIS — D122 Benign neoplasm of ascending colon: Secondary | ICD-10-CM

## 2018-02-07 DIAGNOSIS — D124 Benign neoplasm of descending colon: Secondary | ICD-10-CM

## 2018-02-07 MED ORDER — SODIUM CHLORIDE 0.9 % IV SOLN: 500.0000 mL | Freq: Once | INTRAVENOUS | Status: DC

## 2018-02-07 NOTE — Progress Notes (Signed)
A/ox3, pleased with MAC, report to Jane RN 

## 2018-02-07 NOTE — Progress Notes (Signed)
Pt's states no medical or surgical changes since previsit or office visit. 

## 2018-02-07 NOTE — Patient Instructions (Signed)
YOU HAD AN ENDOSCOPIC PROCEDURE TODAY AT THE [facility]:   Refer to the procedure report that was given to you for any specific questions about what was found during the examination.  If the procedure report does not answer your questions, please call your gastroenterologist to clarify.  If you requested that your care partner not be given the details of your procedure findings, then the procedure report has been included in a sealed envelope for you to review at your convenience later.  YOU SHOULD EXPECT: Some feelings of bloating in the abdomen. Passage of more gas than usual.  Walking can help get rid of the air that was put into your GI tract during the procedure and reduce the bloating. If you had a lower endoscopy (such as a colonoscopy or flexible sigmoidoscopy) you may notice spotting of blood in your stool or on the toilet paper. If you underwent a bowel prep for your procedure, you may not have a normal bowel movement for a few days.  Please Note:  You might notice some irritation and congestion in your nose or some drainage.  This is from the oxygen used during your procedure.  There is no need for concern and it should clear up in a day or so.  SYMPTOMS TO REPORT IMMEDIATELY:   Following lower endoscopy (colonoscopy or flexible sigmoidoscopy):  Excessive amounts of blood in the stool  Significant tenderness or worsening of abdominal pains  Swelling of the abdomen that is new, acute  Fever of 100F or higher   Following upper endoscopy (EGD)  Vomiting of blood or coffee ground material  New chest pain or pain under the shoulder blades  Painful or persistently difficult swallowing  New shortness of breath  Fever of 100F or higher  Black, tarry-looking stools  For urgent or emergent issues, a gastroenterologist can be reached at any hour by calling [phone].   DIET:  We do recommend a small meal at first, but then you may proceed to your regular diet.  Drink  plenty of fluids but you should avoid alcoholic beverages for 24 hours.  ACTIVITY:  You should plan to take it easy for the rest of today and you should NOT DRIVE or use heavy machinery until tomorrow (because of the sedation medicines used during the test).    FOLLOW UP: Our staff will call the number listed on your records the next business day following your procedure to check on you and address any questions or concerns that you may have regarding the information given to you following your procedure. If we do not reach you, we will leave a message.  However, if you are feeling well and you are not experiencing any problems, there is no need to return our call.  We will assume that you have returned to your regular daily activities without incident.  If any biopsies were taken you will be contacted by phone or by letter within the next 1-3 weeks.  Please call us at [phone] if you have not heard about the biopsies in 3 weeks.    SIGNATURES/CONFIDENTIALITY: You and/or your care partner have signed paperwork which will be entered into your electronic medical record.  These signatures attest to the fact that that the information above on your After Visit Summary has been reviewed and is understood.  Full responsibility of the confidentiality of this discharge information lies with you and/or your care-partner.  Polyp and hemorrhoid information given. 

## 2018-02-07 NOTE — Progress Notes (Signed)
Called to room to assist during endoscopic procedure.  Patient ID and intended procedure confirmed with present staff. Received instructions for my participation in the procedure from the performing physician.  

## 2018-02-07 NOTE — Op Note (Signed)
[facility] Patient Name: [name] [name] Procedure Date: 02/07/2018 11:28 AM MRN: [id number] Endoscopist: Jerene Bears , MD Age: 67 Referring MD:  Date of Birth: [birth year] Gender: Male Account #: [id number] Procedure:                Colonoscopy Indications:              Screening for colorectal malignant neoplasm, This                            is the patient's first colonoscopy Medicines:                Monitored Anesthesia Care Procedure:                Pre-Anesthesia Assessment:                           - Prior to the procedure, a History and Physical                            was performed, and patient medications and                            allergies were reviewed. The patient's tolerance of                            previous anesthesia was also reviewed. The risks                            and benefits of the procedure and the sedation                            options and risks were discussed with the patient.                            All questions were answered, and informed consent                            was obtained. Prior Anticoagulants: The patient has                            taken no previous anticoagulant or antiplatelet                            agents. ASA Grade Assessment: III - A patient with                            severe systemic disease. After reviewing the risks                            and benefits, the patient was deemed in                            satisfactory condition to undergo the procedure.  After obtaining informed consent, the colonoscope                            was passed under direct vision. Throughout the                            procedure, the patient's blood pressure, pulse, and                            oxygen saturations were monitored continuously. The                            Colonoscope was introduced through the anus and                            advanced to the cecum,  identified by appendiceal                            orifice and ileocecal valve. The colonoscopy was                            technically difficult and complex due to a                            redundant colon and significant looping. Successful                            completion of the procedure was aided by applying                            abdominal pressure. The patient tolerated the                            procedure well. The quality of the bowel                            preparation was good. The ileocecal valve,                            appendiceal orifice, and rectum were photographed.                            The bowel preparation used was Plenvu x 2 (after                            beginning Argenta). Scope In: 11:32:36 AM Scope Out: 12:04:57 PM Scope Withdrawal Time: 0 hours 18 minutes 46 seconds  Total Procedure Duration: 0 hours 32 minutes 21 seconds  Findings:                 The digital rectal exam was normal.                           A 7 mm polyp was found in the ascending colon. The  polyp was sessile. The polyp was removed with a                            cold snare. Resection and retrieval were complete.                           A 3 mm polyp was found in the descending colon. The                            polyp was sessile. The polyp was removed with a                            cold snare. Resection and retrieval were complete.                           The colon (entire examined portion) was redundant.                           Internal hemorrhoids were found during                            retroflexion. The hemorrhoids were small. Complications:            No immediate complications. Estimated Blood Loss:     Estimated blood loss was minimal. Impression:               - Redundant colon.                           - One 7 mm polyp in the ascending colon, removed                            with a cold snare. Resected  and retrieved.                           - One 3 mm polyp in the descending colon, removed                            with a cold snare. Resected and retrieved.                           - Small internal hemorrhoids. Recommendation:           - Patient has a contact number available for                            emergencies. The signs and symptoms of potential                            delayed complications were discussed with the                            patient. Return to normal activities tomorrow.  Written discharge instructions were provided to the                            patient.                           - Resume previous diet.                           - Continue present medications.                           - Await pathology results.                           - Repeat colonoscopy is recommended. The                            colonoscopy date will be determined after pathology                            results from today's exam become available for                            review. Jerene Bears, MD 02/07/2018 12:08:56 PM This report has been signed electronically.

## 2018-02-08 ENCOUNTER — Telehealth: Payer: Self-pay | Admitting: *Deleted

## 2018-02-08 NOTE — Telephone Encounter (Signed)
Received fax approval from [employer] that Linzess 290 mcg was approved valid from 01/07/18-02/09/19. Notified pharmacy to fill medication.

## 2018-02-08 NOTE — Telephone Encounter (Signed)
No answer, message for the patient. 

## 2018-02-08 NOTE — Telephone Encounter (Signed)
  Follow up Call-  Call back number 02/07/2018 01/15/2018  Post procedure Call Back phone  # [phone]  Permission to leave phone message Yes Yes  Some recent data might be hidden     Patient questions:  Message left to call us if necessary.

## 2018-02-12 ENCOUNTER — Encounter: Payer: Self-pay | Admitting: Internal Medicine

## 2018-02-13 ENCOUNTER — Ambulatory Visit (INDEPENDENT_AMBULATORY_CARE_PROVIDER_SITE_OTHER): Payer: Medicare Other | Admitting: Psychology

## 2018-02-13 DIAGNOSIS — F4323 Adjustment disorder with mixed anxiety and depressed mood: Secondary | ICD-10-CM | POA: Diagnosis not present

## 2018-02-13 DIAGNOSIS — R351 Nocturia: Secondary | ICD-10-CM | POA: Diagnosis not present

## 2018-02-13 DIAGNOSIS — N3941 Urge incontinence: Secondary | ICD-10-CM | POA: Diagnosis not present

## 2018-02-13 DIAGNOSIS — R350 Frequency of micturition: Secondary | ICD-10-CM | POA: Diagnosis not present

## 2018-02-19 ENCOUNTER — Encounter: Payer: Self-pay | Admitting: Internal Medicine

## 2018-02-20 DIAGNOSIS — R350 Frequency of micturition: Secondary | ICD-10-CM | POA: Diagnosis not present

## 2018-02-20 DIAGNOSIS — R3915 Urgency of urination: Secondary | ICD-10-CM | POA: Diagnosis not present

## 2018-02-27 DIAGNOSIS — N3941 Urge incontinence: Secondary | ICD-10-CM | POA: Diagnosis not present

## 2018-02-27 DIAGNOSIS — R350 Frequency of micturition: Secondary | ICD-10-CM | POA: Diagnosis not present

## 2018-03-01 ENCOUNTER — Ambulatory Visit (INDEPENDENT_AMBULATORY_CARE_PROVIDER_SITE_OTHER): Payer: Medicare Other | Admitting: Psychology

## 2018-03-01 DIAGNOSIS — F4323 Adjustment disorder with mixed anxiety and depressed mood: Secondary | ICD-10-CM | POA: Diagnosis not present

## 2018-03-05 ENCOUNTER — Ambulatory Visit (HOSPITAL_COMMUNITY): Payer: Self-pay | Admitting: Psychiatry

## 2018-03-06 DIAGNOSIS — N3941 Urge incontinence: Secondary | ICD-10-CM | POA: Diagnosis not present

## 2018-03-06 DIAGNOSIS — R3915 Urgency of urination: Secondary | ICD-10-CM | POA: Diagnosis not present

## 2018-03-06 DIAGNOSIS — R350 Frequency of micturition: Secondary | ICD-10-CM | POA: Diagnosis not present

## 2018-03-12 ENCOUNTER — Ambulatory Visit (HOSPITAL_COMMUNITY): Payer: Self-pay | Admitting: Psychiatry

## 2018-03-13 ENCOUNTER — Other Ambulatory Visit: Payer: Self-pay | Admitting: Neurology

## 2018-03-15 DIAGNOSIS — R350 Frequency of micturition: Secondary | ICD-10-CM | POA: Diagnosis not present

## 2018-03-15 DIAGNOSIS — R3915 Urgency of urination: Secondary | ICD-10-CM | POA: Diagnosis not present

## 2018-03-19 ENCOUNTER — Encounter (HOSPITAL_COMMUNITY): Payer: Self-pay | Admitting: Psychiatry

## 2018-03-19 ENCOUNTER — Ambulatory Visit (INDEPENDENT_AMBULATORY_CARE_PROVIDER_SITE_OTHER): Payer: Medicare Other | Admitting: Psychiatry

## 2018-03-19 DIAGNOSIS — F332 Major depressive disorder, recurrent severe without psychotic features: Secondary | ICD-10-CM | POA: Diagnosis not present

## 2018-03-19 MED ORDER — BUPROPION HCL ER (XL) 300 MG PO TB24: ORAL_TABLET | ORAL | 0 refills | Status: DC

## 2018-03-19 MED ORDER — CITALOPRAM HYDROBROMIDE 40 MG PO TABS: 40.0000 mg | ORAL_TABLET | Freq: Every day | ORAL | 0 refills | Status: DC

## 2018-03-19 NOTE — Progress Notes (Signed)
[facility] MD/PA/NP OP Progress Note  03/19/2018 8:34 AM [name] [name]  MRN:  [id number]  Chief Complaint: I am getting easily forgetful.  HPI: Patient came for his follow-up appointment with his wife.  Wife endorse that husband has been more forgetful and difficulty remembering things mostly at nighttime.  She is also concerned about her husband's tremors shakes but lately no more hallucination.  He is sleeping better.  He continues to have freezing spells when he do not remember anything at all.  Recently they went to [place] and wife recall few incidents when patient was inappropriate.  However she denies any crying spells, feeling of hopelessness or worthlessness.  He denies any suicidal thoughts or homicidal thought.  He believe current medicine is working for his depression but he is also concerned about his worsening of Parkinson.  He has appointment to see Dr. [name] Tat in July.  He is hoping to resume physical therapy.  Patient is going to boxing club on a regular basis.  Sometime he does crossword puzzles to keep himself busy.  He is going to [facility] for therapy and he really like counseling.  He wants to continue Celexa and Wellbutrin.  He has tremors and he is aware about his Parkinson comorbidity.  Patient does not drive and his wife takes him to the doctor's appointment.  His wife also ask about CBD oil to help his tremors and I recommended that they should discuss this with his neurologist.  Appetite is okay.  Visit Diagnosis:    ICD-10-CM   1. 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 Patient denies any history of suicidal attempt or any inpatient psychiatric treatment. He denies any history of mania, psychosis, paranoia or any hallucination. In the past he had tried Effexor, Abilify, Paxil, Cymbalta,  Zoloft, amitriptyline, Pristiq, Britnellex, Celexa, Deplin,Vyvanseand Latuda. He is seen in this office since September 2009. He has seen multiple psychiatrists in the past.   Past Medical History:  Past Medical History:  Diagnosis Date  .  OSA (obstructive sleep apnea)    npsg 2012:  AHI 67/hr. Auto titration 2012:  Optimal pressure 12cm.   . APPENDECTOMY, HX OF 02/18/2008   Qualifier: Diagnosis of  By: [place], [place]    . Cardiac murmur    as a child  . Chronic idiopathic constipation   . Depression   . Headache(784.0)   . HEADACHES, HX OF 02/18/2008   Qualifier: Diagnosis of  By: Danny Lawless CMA, [place]    . Parkinson disease ([place]) 11/2014  . Sinus mucosal thickening    pt unable to lay flat  . Streptococcal meningitis    as an infant    Past Surgical History:  Procedure Laterality Date  . APPENDECTOMY  1967  . NASAL SINUS SURGERY     x 4 as a child  . VASECTOMY      Family Psychiatric History: Reviewed.    Family History:  Family History  Problem Relation Age of Onset  . Lung cancer Father   . Alcohol abuse Brother   . Drug abuse Brother   . Seizures Daughter   . Colon cancer Neg Hx   . Stomach cancer Neg Hx   . Rectal cancer Neg Hx   . Esophageal cancer Neg Hx     Social History:  Social History  Socioeconomic History  . Marital status: Married    Spouse name: Not on file  . Number of children: Y  . Years of education: Not on file  . Highest education level: Not on file  Occupational History  . 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 Financial resource strain: Not on file  . Food insecurity:    Worry: Not on file    Inability: Not on file  . Transportation needs:    Medical: Not on file    Non-medical: Not on file  Tobacco Use  . Smoking status: Never Smoker  . Smokeless tobacco: Never Used  Substance and Sexual Activity  . Alcohol use: No    Alcohol/week: 0.0 oz  . Drug use: No  . Sexual  activity: Yes    Birth control/protection: None  Lifestyle  . Physical activity:    Days per week: Not on file    Minutes per session: Not on file  . Stress: Not on file  Relationships  . Social connections:    Talks on phone: Not on file    Gets together: Not on file    Attends religious service: Not on file    Active member of club or organization: Not on file    Attends meetings of clubs or organizations: Not on file    Relationship status: Not on file  Other Topics Concern  . Not on file  Social History Narrative   Wife-care partner     Allergies: No Known Allergies  Metabolic Disorder Labs: Lab Results  Component Value Date   HGBA1C 6.1 10/09/2017   No results found for: PROLACTIN Lab Results  Component Value Date   CHOL 172 10/09/2017   TRIG 120.0 10/09/2017   HDL 52.30 10/09/2017   CHOLHDL 3 10/09/2017   VLDL 24.0 10/09/2017   LDLCALC 96 10/09/2017   LDLCALC 127 (H) 03/14/2016   Lab Results  Component Value Date   TSH 1.16 03/14/2016   TSH 0.80 02/06/2014    Therapeutic Level Labs: No results found for: LITHIUM No results found for: VALPROATE No components found for:  CBMZ  Current Medications: Current Outpatient Medications  Medication Sig Dispense Refill  . B Complex-C (B-COMPLEX WITH VITAMIN C) tablet Take 1 tablet by mouth daily.    Marland Kitchen buPROPion (WELLBUTRIN XL) 300 MG 24 hr tablet TAKE 1 TABLET BY MOUTH EVERY DAY IN THE MORNING 90 tablet 0  . carbidopa-levodopa (SINEMET IR) 25-100 MG tablet TAKE 1 TABLET BY MOUTH THREE TIMES A DAY 270 tablet 1  . citalopram (CELEXA) 40 MG tablet Take 1 tablet (40 mg total) by mouth daily. 90 tablet 0  . fesoterodine (TOVIAZ) 4 MG TB24 tablet Take 1 tablet (4 mg total) by mouth daily. 30 tablet 0  . linaclotide (LINZESS) 290 MCG CAPS capsule Take 1 capsule (290 mcg total) by mouth daily before breakfast. 30 capsule 5  . Omega 3-6-9 Fatty Acids (OMEGA 3-6-9 PO) Take by mouth.    Marland Kitchen rOPINIRole (REQUIP) 2 MG tablet  Take 1 tablet (2 mg total) by mouth 3 (three) times daily. 270 tablet 1  . sildenafil (VIAGRA) 100 MG tablet Take 0.5-1 tablets (50-100 mg total) by mouth daily as needed for erectile dysfunction. (Patient not taking: Reported on 02/07/2018) 10 tablet 2  . TURMERIC CURCUMIN PO Take by mouth.     Current Facility-Administered Medications  Medication Dose Route Frequency Provider Last Rate Last Dose  .  0.9 %  sodium chloride infusion  500 mL Intravenous Once Pyrtle, Lajuan Lines, MD      . 0.9 %  sodium chloride infusion  500 mL Intravenous Once Pyrtle, Lajuan Lines, MD         Musculoskeletal: Strength & Muscle Tone: decreased Gait & Station: shuffle Patient leans: Front and Backward  Psychiatric Specialty Exam: ROS  Blood pressure 138/82, pulse 77, height 6\' 2"  (1.88 m), weight 295 lb (133.8 kg).There is no height or weight on file to calculate BMI.  General Appearance: Fairly Groomed  Eye Contact:  Fair  Speech:  Slow  Volume:  Decreased  Mood:  Dysphoric  Affect:  Constricted and Flat  Thought Process:  Descriptions of Associations: Intact  Orientation:  Full (Time, Place, and Person)  Thought Content: poverty of thought content   Suicidal Thoughts:  No  Homicidal Thoughts:  No  Memory:  Immediate;   Fair Recent;   Poor Remote;   Fair  Judgement:  Good  Insight:  Good  Psychomotor Activity:  Decreased, Shuffling Gait and Tremor  Concentration:  Concentration: Fair and Attention Span: Fair  Recall:  [employer] of Knowledge: Fair  Language: Good  Akathisia:  No  Handed:  Right  AIMS (if indicated): not done  Assets:  Desire for Improvement Housing Social Support  ADL's:  Intact  Cognition: Impaired,  Mild  Sleep:  Good   Screenings:   Assessment and Plan: Major depressive disorder, recurrent.  Generalized anxiety disorder.  Reassurance given.  Recommended to keep appointment with the neurologist and gene.  For counseling.  I do notice his tremors and memory has been worsened  from the past.  Patient does not want to change his psychotropic medication since he did feel his depression is a stable.  I will continue Wellbutrin XL 300 mg daily, Celexa 40 mg daily.  Recommended to call us back if is any question or any concern.  Follow-up in 3 months.   Kathlee Nations, MD 03/19/2018, 8:34 AM

## 2018-03-20 DIAGNOSIS — N3941 Urge incontinence: Secondary | ICD-10-CM | POA: Diagnosis not present

## 2018-03-20 DIAGNOSIS — R350 Frequency of micturition: Secondary | ICD-10-CM | POA: Diagnosis not present

## 2018-03-23 ENCOUNTER — Ambulatory Visit (INDEPENDENT_AMBULATORY_CARE_PROVIDER_SITE_OTHER): Payer: Medicare Other | Admitting: Psychology

## 2018-03-23 DIAGNOSIS — F4323 Adjustment disorder with mixed anxiety and depressed mood: Secondary | ICD-10-CM | POA: Diagnosis not present

## 2018-03-26 ENCOUNTER — Other Ambulatory Visit: Payer: Self-pay | Admitting: Neurology

## 2018-03-27 DIAGNOSIS — N3941 Urge incontinence: Secondary | ICD-10-CM | POA: Diagnosis not present

## 2018-03-27 DIAGNOSIS — R350 Frequency of micturition: Secondary | ICD-10-CM | POA: Diagnosis not present

## 2018-04-06 ENCOUNTER — Ambulatory Visit (INDEPENDENT_AMBULATORY_CARE_PROVIDER_SITE_OTHER): Payer: Medicare Other | Admitting: Psychology

## 2018-04-06 DIAGNOSIS — F4323 Adjustment disorder with mixed anxiety and depressed mood: Secondary | ICD-10-CM

## 2018-04-10 NOTE — Progress Notes (Signed)
[name] [name] was seen today in the movement disorders clinic for neurologic consultation at the request of [name] Koch, MD.  The consultation is for the evaluation of slowness, flat affect and to r/o a neurologic disorder.  This patient is accompanied in the office by his spouse who supplements the history.  The first symptom(s) the patient noticed was a feeling of weakness that has been going on for about a year.  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  For about a year and a half he has noted a "body" tremor and his wife has noted a hand tremor in both hands (at rest).  He is stiff like he is "a 67 year old man."  Once he is in bed he finds it is hard to move.  The patient has a hx of significant depression and is on multiple medications related to this.  He was started on cogentin just yesterday, but is also on celexa, wellbutrin and latuda.  He has been on [place] for 1.5 months.  Prior to that he was on abilify (tried on 2 separate times but most recently was only on it for a day) and geodon(doesn't remember this but looks like it may have been given in the past).  He has been on risperdal but cannot remember how long ago.  ECT was offered but his wife does not want him to proceed with that  11/20/14 update:  The patient returns today for follow-up.  He is accompanied by his wife who supplements the history.  He has been off of [place] for about 2 weeks.  Unfortunately, he was unable to afford the dat scan.  He is on just Wellbutrin and Celexa for depression and his Cogentin and has been discontinued.  He has noted a decreased appetite and dry mouth and having trouble sleeping because of stiffness.  Pt states that he is almost afraid to go to sleep.  Missing 2 days of work per week (works nights) but a lot of that because of anxiety.  His wife notices increased tremor.  He has slow at work.  Admits to some  lightheadedness but no syncope.  Note diplopia.  No hallucinations.  His wife is doing most of the driving because he is having difficulty staying in his own lane.  02/19/15 update:  The patient is following up today regarding his parkinsonism.  I reviewed records from his psychiatrist since last visit.  His psychiatry note from 12/04/14 indicates that the patient stated that tremor was better.  However, I received a call 4 days later stating that tremor was worse and he thought it was from the Mirapex.  He wanted to change the Mirapex to something else.  He was on Mirapex 0.5 mg 3 times per day at the time.  We ended up switching it to Requip 1 mg 3 times a day.  Fortunately he is doing better.  He denies any side effects with the Requip.  He currently takes it at 8 AM/2 PM/8 PM.  He does state that he notices that he "shuffles with the left leg."  He has not fallen but sometimes feels off balance.  He states that he finished his physical/occupational/speech therapy and feels that it went well.  He is not exercising on his own.  He initially states that he does not feel that he has time, but also states that he does not  go into work until 2 PM and works until 10:30 PM.  This is a change for him.  He was able to change his job at [employer] and seems like that is doing better.  His wife mentions that he continues to have memory loss that she thinks has increased, but she also states that she has noticed memory loss ever since starting on antidepressants.  He had one fall since last visit.  This was when he was taking the trash to the street.  He had no fractures with this, just a superficial scrape.    05/25/15 update:  The patient presents today, accompanied by his wife who supplements the history.  His Requip was increased last visit so that was taking 2 tablets in the morning, one in afternoon and 2 in the evening and 1 day after I started this, I received a call from his wife that the patient did not  tolerate it and it made symptoms worse (which is what they reported with Mirapex).  While that was very odd to me, I told them to go back down to 1 mg 3 times per day.  He actually comes back on 1 mg, 1 in the AM, 2 in the afternoon and 1 in the evening.  He reports that he is doing worse.  He has more tremor and he feels tired in the evening.  Pts wife asks about trying to go back up on the requip slowly.   He had neuropsych testing in June at [facility].  I reviewed that.  There was no evidence of dementia.  There was evidence of mild cognitive impairment from Parkinson's disease as well as from chronic major depressive disorder.  It is recommended that the patient consider transcranial magnetic stimulation for depression, and he told the neuropsychologist that he considered in the past and opted against it.  She encouraged him to attend a Parkinson's support group and he told her that he tried that once and it was not helpful.  He still c/o "my memory has been terrible and especially over the last few weeks."  He feels that he is not depressed - "that is well controlled."  He is not doing CV exercise.  He is still in his new job but it seems more physical than his old job and he thinks he is not as fast in getting tasks done.  He asks about DBS.  08/25/15 update:  The patient is following up today, accompanied by his wife who supplements the history.  I have reviewed records since our last visit.  He is on Requip XL, which was increased to 6 mg at night last visit.  He changed that back to 2 mg tid of the regular ropinirole because of cost.   He is enrolled in physical therapy.  "I'm doing bad."  When asked to describe how he feels bad, he has trouble describing it.  He had a root canal and hasn't felt well since.  He hasn't been back to work for 4 weeks.  He feels that his speech is low and states that he went to voice therapy 2 weeks ago.  He isn't practicing the voice therapy faithfully.  He is  riding the bike on the highest resistance 20 mins a day.  He is having word finding trouble.   No falls but feels that he is "weaker."  He asks me about going to "light duty" at work and possibly retirement in the near future.  He  continues to see psychiatry for his depression.  He last saw psychiatry on 06/04/2015.  No changes were made in medication as he stated that mood was good and continues to reaffirm that.    11/30/15 update:  The patient follows up today, accompanied by his wife who supplements the history.  Last visit, his ropinirole was increased to 4 mg in the morning (8am but goes back to bed for another 1 hour or so), another 2 mg in the afternoon (1-2 pm) and evening (8-8:30pm).  I changed him to light duty at work last visit at his request.  He called me recently and wanted changed from being able to work 4 hours per day to a total of only 10 hours per week.  I told him that he would need a functional capacity evaluation for this, as I was not sure that this made sense from a Parkinson standpoint, given his stage of Parkinson's disease.  He was fairly frustrated with this recommendation, but I told him that I needed some objective evidence.  He reports that he is awaiting an appt for the FCE (has called).   He has been seeing psychiatry and I  actually received a call from his psychiatrist back in December that his psychiatrist felt that he needed to be on levodopa.  I did not think that was the case, but I was willing to do a levodopa challenge to see if that was the case.  He was scheduled to have that done, but the patient canceled that.  Does report that he thinks that depression under good control.   He denies falls.  Some lightheadedness when first gets up, but no near syncope.  Using bike daily for 15-20 minutes.  No hallucinations; rare visual distortions.  Wife thinks that he seems more stiff and has involuntary "tics and shaking" at night.  Waking up with AM headache.  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  03/29/16 update:  The patient follows up today, accompanied by his wife who supplements the history.  Last visit, his ropinirole was increased to 4 mg in the morning, 2 mg in the afternoon and 2 mg in the evening.  Has intermittent spells of increased stiffness, slower on stairs.  Riding recumbant bike at home.  He has been seeing psychiatry and is still on celexa and wellbutrin.  I reviewed last records with Dr. Adele Schilder and pt continued to c/o memory issues (long standing c/o) and was told to discuss with me as may be PD related.  Pt had neuropsych testing done about a year ago and demonstrated no neurodegenerative memory change related to parkinsons but did feel that depression was an issue affecting cognition.  Thinks that memory is worse. No falls since last visit.  He has attended PT/OT/ST from March-May.  He c/o drooling.  Also c/o talking at night and some tremor at night.  Happens most night.  Also c/o snoring and wife thinks that is due to weight and trying to cut back on sugars.  Has OSAS but doesn't use the CPAP.  Has some visual distortions but no hallucinations.  Can be frightening.  Having some urinary incontinence.  Saw urology in the past but not for incontinence.    05/30/16 update:  The patient follows up today, accompanied by his wife who supplements the history.  He is on ropinirole, 4 mg in the morning, 2 mg in the afternoon and 2 mg in the evening.  He denies compulsive behaviors.  He denies sleep attacks.  He is now attending the ACT gym and feels he is getting benefit from this.  He is getting scholarship information.  He had repeat neuropsych testing on August 2 with a follow-up with Dr. Si Raider on August 10.  This was largely consistent with his performance one year prior.  There is mild cognitive impairment consistent with Parkinson's disease and untreated obstructive sleep apnea syndrome.  There was evidence of major depressive disorder,  and Dr. Si Raider felt that there was long-standing personality issues that influenced the patient's view of the world in a negative fashion.  He saw urology since last visit and I did get a note from them.  It was not clear what medication he was tried on, just stated that he tried a beta 3 agonist (?  Myrbetriq).  Pt states that it was in fact myrbetriq that was added.  Pt states that it hasn't helped but wife thinks that he isn't getting up as much during the night.  He was getting up 3 times during the night and now he may get up 1-2 times during the night.  Having some dizziness when gets up for about 2 min.  Drinking 2 L of water a day.  Given up sugar completely.  I did order a split night PSG last time but results are not available.  He c/o increased drooling.  09/14/16 update:  Patient follows up today, accompanied by his wife who supplements the history.  He is on ropinirole, 4 mg in the morning, 2 mg in the afternoon and 2 mg in the evening.  Pt had one fall on the ice when it snowed but that was it.  Pt denies lightheadedness, near syncope.  No hallucinations.  Mood has been good.  He remains on a combination of Wellbutrin and Celexa for depression and thinks that this has been well controlled.  He saw psychiatry since our last visit.  He was referred to psychology for counseling.  He had a functional capacity evaluation on 09/01/2016.  Stated that the patient could exert up to 20 pounds of force occasionally and up to 10 pounds of force frequently, but it also stated that physical demand requirements are in excess of those for sedentary work.  It stated that any job that the patient should engage in should be rated "light work" when it requires walking or standing to a significant degree, when it requires sitting most of the time but entails pushing and/or pulling of leg or arm controls and/or when the job requires working at a production rate paced entailing constant pushing and/or pulling a materials  even if the weight of his materials is negligible.  It did state that based on the evaluation, the client was capable of sustaining light level of work for an 8 hour per day, 40 hour per week job.  Asks me about drooling.  Does not want to try botox.  Asks me about cough and swollen ankles.  C/o talking in sleep.  Will waken him out sleep.  Having trouble getting in and out of the cars (not so much his truck)..  Hits his head on the door jam.  Started PT last week.  Still very sleepy during the day.    12/06/16 update:  Patient has up today, accompanied by his wife who supplements the history.  Patient remains on ropinirole, 4 mg in the morning, 2 mg in the afternoon and 2 mg in the evening. Having  some illusions but once gets close on the object he will realize what it is.  Wife notes some increased tremor but patient doesn't.   He has 2 falls since our last visit.  One he tripped over a curb and the other he fell down stairs but he was close to the bottom and didn't get hurt.  He is working out and doing exercises.  He has issues with drooling, but does not want to pursue Myobloc, nor does he want Robinul.  He did attend rehabilitation since our last visit and I reviewed those records.  Still going to ACT bid.  Feels like speech is slurring.   04/10/17 update:  Patient seen today in follow-up, accompanied by his wife who supplements the history.  Patient is on ropinirole, 4 mg in the morning, 2 mg in the afternoon and 2 mg in the evening.  C/o ankle swelling.  Wondered if it was the [place].  I wanted to start him on carbidopa/levodopa 25/100, one tablet 3 times per day for last visit but he decided to hold on that. Interestingly, he saw Dr. Adele Schilder in June and I reviewed those records.  The patient had reported to him that he felt more stiff and tremulous and according to notes the psychiatrist told him that he thought he needed levodopa.   I was contacted by physical therapy that he was orthostatic in therapy.  He  came to our office for orthostatics and was not orthostatic that day.  We did give him a prescription for the abdominal compression Binder.  He isn't using that and didn't fill the RX.  We told him to increase hydration.  He is drinking 2 liters per day.  Is starting RSB today in [place].    07/17/17 update:  Patient seen today in follow-up for parkinsonism.  He is accompanied by his wife who supplements the history.  He is on ropinirole, 4 mg in the morning, 2 mg in the afternoon and 2 mg in the evening.  Last visit, we started carbidopa/levodopa 25/100, one tablet 3 times per day.  He has attended rehabilitation therapy since our last visit and those notes are reviewed.   He is still going to ACT gym and RSB.  Despite that, he feels weak and doesn't feel as strong as he was before last visit.  He doesn't describe it as dizziness.  He states that he doesn't have an opinion on efficacy of levodopa but wife thinks that it has been beneficial for tremor.  He has a fear of falling and that will slow his movement.  He has not had any actual falls.  Noted swelling of feet and ankles since august.  Some trouble with bladder incontinence and noting early issues with bowels as well.  States that his memory has gotten worse and forgot to take his medication this AM.   He continues to follow with psychiatry.  He is on Wellbutrin and Celexa.  11/13/17 update: Patient is seen today in follow-up.  He is accompanied by his wife who supplements the history.  Records made available to me have been reviewed.  We decreased his ropinirole last visit to 2 mg 3 times per day.  He remains on carbidopa/levodopa 25/100, 1 tablet 3 times per day.  He is still very active with rock steady boxing and ACT.  Pt states that he feels like the "Parkinsons is winning."   His wife brings a very long list of things she would like to discussed, one  of which is the fact that balance and movement seem to be more difficult, despite the exercise.  In  addition, stairs are getting more difficult.  He was evaluated by the rehab center November 08, 2017 and felt that it would be beneficial for him to restart therapy.  No falls, but he has a fearful reveals, shuffling more and he has swollen ankles, per wife.  Wife also complains that the patient has several episodes of nocturia (2-3 times per night).  He has seen his psychiatrist since last visit.  He also came in with his wife and met with our Education officer, museum.  He admitted to her that he did have increasing depression (not admitted to psychiatry per notes).  He wanted referral to a new counselor, because of insurance change she could not see his previous one.  He was given names.  He has started to see Pervis Hocking  04/16/18 update: Patient is seen today in follow-up for parkinsonism.  He is accompanied by his wife who supplements the history.  Records have been reviewed since last visit.  He also brings in a long list of c/o (16 items on list).  He is on ropinirole, 2 mg 3 times per day.  Discussed last visit changing the timing of his carbidopa/levodopa 25/100 to 7 AM/11 AM/4 PM.  Reports today that he is following this schedule.  Pt denies falls.  Has tripped and caught himself.  Legs feel heavy.  Feels like he is freezing at times.  Pt denies lightheadedness, near syncope.  States that he has hallucinations but they are visual distortions when described.   He c/o excessive drooling.  seeing Pervis Hocking.  Last saw psychiatry on March 19, 2018 and those records are reviewed.  Has attended rehab therapy since last visit and those notes are reviewed.  He c/o speech trouble.  He c/o magnetic gait.  He c/o EDS - states that last with PSG several years ago.  First time, he had evidence of OSAS and second time he did not.  He isn't doing speech exercises at home but is doing other exercises.  Continues to complain about memory change, which has been a long-term concern.    Neuroimaging has previously been performed.  It  is available for my review today and I reviewed it with him.  There is no BG disease.  ALLERGIES:  No Known Allergies  CURRENT MEDICATIONS:  Outpatient Encounter Medications as of 04/16/2018  Medication Sig  . B Complex-C (B-COMPLEX WITH VITAMIN C) tablet Take 1 tablet by mouth daily.  Marland Kitchen buPROPion (WELLBUTRIN XL) 300 MG 24 hr tablet TAKE 1 TABLET BY MOUTH EVERY DAY IN THE MORNING  . carbidopa-levodopa (SINEMET IR) 25-100 MG tablet TAKE 1 TABLET BY MOUTH THREE TIMES A DAY  . citalopram (CELEXA) 40 MG tablet Take 1 tablet (40 mg total) by mouth daily.  . fesoterodine (TOVIAZ) 4 MG TB24 tablet Take 1 tablet (4 mg total) by mouth daily.  Marland Kitchen linaclotide (LINZESS) 290 MCG CAPS capsule Take 1 capsule (290 mcg total) by mouth daily before breakfast.  . Omega 3-6-9 Fatty Acids (OMEGA 3-6-9 PO) Take by mouth.  Marland Kitchen rOPINIRole (REQUIP) 2 MG tablet Take 1 tablet (2 mg total) by mouth 3 (three) times daily.  . sildenafil (VIAGRA) 100 MG tablet Take 0.5-1 tablets (50-100 mg total) by mouth daily as needed for erectile dysfunction.  . TURMERIC CURCUMIN PO Take by mouth.  . [DISCONTINUED] rOPINIRole (REQUIP) 2 MG tablet PLEASE SEE ATTACHED FOR DETAILED  DIRECTIONS  . [DISCONTINUED] 0.9 %  sodium chloride infusion   . [DISCONTINUED] 0.9 %  sodium chloride infusion    No facility-administered encounter medications on file as of 04/16/2018.     PAST MEDICAL HISTORY:   Past Medical History:  Diagnosis Date  .  OSA (obstructive sleep apnea)    npsg 2012:  AHI 67/hr. Auto titration 2012:  Optimal pressure 12cm.   . APPENDECTOMY, HX OF 02/18/2008   Qualifier: Diagnosis of  By: [place], [place]    . Cardiac murmur    as a child  . Chronic idiopathic constipation   . Depression   . Headache(784.0)   . HEADACHES, HX OF 02/18/2008   Qualifier: Diagnosis of  By: Danny Lawless CMA, [place]    . Parkinson disease ([place]) 11/2014  . Sinus mucosal thickening    pt unable to lay flat  . Streptococcal meningitis    as an  infant    PAST SURGICAL HISTORY:   Past Surgical History:  Procedure Laterality Date  . APPENDECTOMY  1967  . COLONOSCOPY    . NASAL SINUS SURGERY     x 4 as a child  . VASECTOMY      SOCIAL HISTORY:   Social History   Socioeconomic History  . Marital status: Married    Spouse name: Not on file  . Number of children: Y  . Years of education: Not on file  . Highest education level: Not on file  Occupational History  . 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 Financial resource strain: Not on file  . Food insecurity:    Worry: Not on file    Inability: Not on file  . Transportation needs:    Medical: Not on file    Non-medical: Not on file  Tobacco Use  . Smoking status: Never Smoker  . Smokeless tobacco: Never Used  Substance and Sexual Activity  . Alcohol use: No    Alcohol/week: 0.0 oz  . Drug use: No  . Sexual activity: Yes    Birth control/protection: None  Lifestyle  . Physical activity:    Days per week: Not on file    Minutes per session: Not on file  . Stress: Not on file  Relationships  . Social connections:    Talks on phone: Not on file    Gets together: Not on file    Attends religious service: Not on file    Active member of club or organization: Not on file    Attends meetings of clubs or organizations: Not on file    Relationship status: Not on file  . Intimate partner violence:    Fear of current or ex partner: Not on file    Emotionally abused: Not on file    Physically abused: Not on file    Forced sexual activity: Not on file  Other Topics Concern  . Not on file  Social History Narrative   Wife-care partner     FAMILY HISTORY:   Family Status  Relation Name Status  . Father  Deceased       lung cancer, alzheimer's  . Mother  Alive       HTN, A fib  . Brother  Deceased       accident  . Sister  Alive       healthy  . Son  Alive       healthy  . Daughter  Alive       healthy   . Daughter  Alive       healthy  . Mat Aunt  Alive       Parkinson's Disease  . Neg Hx  (Not Specified)    ROS:  A complete 10 system review of systems was obtained and was unremarkable apart from what is mentioned above.  PHYSICAL EXAMINATION:    VITALS:   Vitals:   04/16/18 0801  BP: 132/86  Pulse: 76  Weight: (!) 302 lb (137 kg)  Height: '6\' 2"'$  (1.88 m)   Wt Readings from Last 3 Encounters:  04/16/18 (!) 302 lb (137 kg)  02/07/18 289 lb (131.1 kg)  01/31/18 289 lb (131.1 kg)     GEN:  The patient appears stated age and is in NAD. HEENT:  Normocephalic, atraumatic.  The mucous membranes are moist. The superficial temporal arteries are without ropiness or tenderness. CV:  RRR Lungs:  CTAB Neck/HEME:  There are no carotid bruits bilaterally. MS:  Has minor LE edema  Neurological examination:  Orientation: The patient is alert and oriented x3.  Cranial nerves: There is good facial symmetry. 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   The speech is fluent and clear.  He is hypophonic.  The patient is able to make the gutteral sounds without difficulty. Soft palate rises symmetrically and there is no tongue deviation. Hearing is intact to conversational tone. Sensation: Sensation is intact to light touch throughout Motor: Strength is at least antigravity today  Movement examination: Tone: There is mild increased tone in the RUE tremor Abnormal movements: There is no tremor today Coordination:  There is no decremation, with any form of RAMS, including alternating supination and pronation of the forearm, hand opening and closing, finger taps, heel taps and toe taps. Gait and Station: The patient has mild trouble getting OOC.  Arm swing is decreased bilaterally.  Labs:    Chemistry      Component Value Date/Time   NA 138 10/09/2017 1138   K 4.3 10/09/2017 1138   CL 104 10/09/2017 1138   CO2 28 10/09/2017 1138   BUN 16 10/09/2017 1138   CREATININE 1.14 10/09/2017 1138        Component Value Date/Time   CALCIUM 9.3 10/09/2017 1138   ALKPHOS 66 10/09/2017 1138   AST 13 10/09/2017 1138   ALT 21 10/09/2017 1138   BILITOT 0.6 10/09/2017 1138     Lab Results  Component Value Date   HGBA1C 6.1 10/09/2017      ASSESSMENT/PLAN:  1.  Parkinsonism  - decrease Ropinirole from 2 mg tid to 1 mg tid due to EDS and memory change.    -increase carbidopa/levodopa 25/100 to 2/2/1 (from 1 po tid)  -pt not driving and asked about that.  Hold until EDS resolved and then can do medical driving evaluation.  Wife really does not want him driving anyway.  -talked about how rhythmic music can help freezing (discussed ear buds and music), which was discussed last visit as well.  I would love to see him in the PARTS program.  They are considering this.  -The patient asked me about CBD oil.  Discussed literature on that as it relates to Parkinson's disease.  Not recommended by [facility] because of lack of controlled trials.  Talked about smaller trials in which marijuana helped tremor.  However, there is some data that suggests that it worsens cognition and falls.  The data also suggests that CBD oil is less  effective than marijuana.  However, there have been concerns given the fact that CBD oil is unregulated and each manufacturer has different amounts of ingredient and the purity of each manufacturers ingredient has been called into question.  At this time, it is not recommended for the treatment of Parkinson's disease.  Further studies do need to be completed. 2  Depression  -The patient reports that this has been "resolved."  He is not suicidal or homicidal.  He will remain on Celexa and Wellbutrin and remain under the care of psychiatry.  He asked me about that and would recommend continue.    -Continue counseling with Pervis Hocking 3.  Sialorrhea  -This is commonly associated with PD.  We talked about treatments.  The patient is not a candidate for oral anticholinergic therapy because  of increased risk of confusion and falls.  We discussed Botox (type A and B) and 1% atropine drops.  We discusssed that candy like lemon drops can help by stimulating mm of the oropharynx to induce swallowing.  He doesn't want to pursue these options 4.  Dizziness and occasional Orthostatic hypotension  -drinking plenty of fluid and doing well with that.  -has abdominal compression binder but isn't using.  5.  Memory loss  -Patient has twice had neurocognitive testing, the last of which was in December 2017.  I have not seen any significant cognitive decline, despite continued complaints.  Last testing Dr. Si Raider felt that some of the issues related to the patient's personality of seeing the world in a negative light.  I tend to agree with this.  Before this is repeated, I want to rework meds.  I still don't think that he has dementia.   6.  Urinary incontinence, frequency and nocturia  -Wife asked me about this again today.  We have discussed this at previous visits.  He is already following with urology at [facility] urology but admits that hasn't been there in a long time (about a year).  Told to make a follow appointment.  She asks about leg swelling.  Urology had previously stopped the Toviaz to see if it would help the swelling, but his bladder function got worse and it was restarted.   7.  EDS  -Patient had nocturnal polysomnogram in August, 2017 with [facility] 5.6.  Decided to first change his medication around before trying to repeat this. 8.  Follow up is anticipated in the next few months, sooner should new neurologic issues arise.  Much greater than 50% of this visit was spent in counseling and coordinating care.  Total face to face time:  35 min

## 2018-04-16 ENCOUNTER — Ambulatory Visit (INDEPENDENT_AMBULATORY_CARE_PROVIDER_SITE_OTHER): Payer: Medicare Other | Admitting: Neurology

## 2018-04-16 ENCOUNTER — Encounter: Payer: Self-pay | Admitting: Neurology

## 2018-04-16 VITALS — BP 132/86 | HR 76 | Ht 74.0 in | Wt 302.0 lb

## 2018-04-16 DIAGNOSIS — G20 Parkinson's disease: Secondary | ICD-10-CM

## 2018-04-16 DIAGNOSIS — K117 Disturbances of salivary secretion: Secondary | ICD-10-CM | POA: Diagnosis not present

## 2018-04-16 DIAGNOSIS — G20A1 Parkinson's disease without dyskinesia, without mention of fluctuations: Secondary | ICD-10-CM

## 2018-04-16 DIAGNOSIS — G4710 Hypersomnia, unspecified: Secondary | ICD-10-CM

## 2018-04-16 DIAGNOSIS — F331 Major depressive disorder, recurrent, moderate: Secondary | ICD-10-CM | POA: Diagnosis not present

## 2018-04-16 NOTE — Patient Instructions (Signed)
Change medications as follows:   Take Requip 2 mg - 1 tablet in the morning, 1 tablet in the afternoon, 1/2 in the evening  AND Carbidopa Levodopa - 2 in the morning, 1 in the afternoon, 1 in the evening FOR ONE WEEK  Then take Requip 2 mg - 1 tablet in the morning, 1/2 tablet in the afternoon, 1/2 tablet in the evening  AND Carbidopa Levodopa - 2 in the morning, 2 in the afternoon, 1 in the evening FOR ONE WEEK  Then take Requip 2 mg - 1/2 tablet 3 times daily (until you run out of Requip 2 mg, then we can call in 1 mg tablets)  Remain on Carbidopa Levodopa 2 in the morning, 2 in the afternoon, 1 in the evening

## 2018-04-17 DIAGNOSIS — R3915 Urgency of urination: Secondary | ICD-10-CM | POA: Diagnosis not present

## 2018-04-17 DIAGNOSIS — R350 Frequency of micturition: Secondary | ICD-10-CM | POA: Diagnosis not present

## 2018-04-27 ENCOUNTER — Ambulatory Visit (INDEPENDENT_AMBULATORY_CARE_PROVIDER_SITE_OTHER): Payer: Medicare Other | Admitting: Psychology

## 2018-04-27 DIAGNOSIS — F4323 Adjustment disorder with mixed anxiety and depressed mood: Secondary | ICD-10-CM | POA: Diagnosis not present

## 2018-05-02 ENCOUNTER — Ambulatory Visit: Payer: Medicare Other | Admitting: Psychology

## 2018-05-07 ENCOUNTER — Telehealth: Payer: Self-pay | Admitting: Neurology

## 2018-05-07 NOTE — Telephone Encounter (Signed)
Its my understanding that he has intermittent freezing and then stops and can move.  Apokyn and Inbrija are not for those momentary freezes.  They are for inability to get up out of chair and stuck/frozen there or to help first morning "on."  Apokyn/inbrija can't be used 15 times per day.  Does he think that carbidopa/levodopa 25/100 helps the freezing?  If not, then those meds won't help either.

## 2018-05-07 NOTE — Telephone Encounter (Signed)
Spoke with patient and he states having more episodes of freezing. It happens about 15 times a day. Episodes last a couple minutes each time. He wants to know if he would be a candidate for Apokyn. Please advise.

## 2018-05-07 NOTE — Telephone Encounter (Signed)
Patient wants to talk to someone about medication apokyn

## 2018-05-08 DIAGNOSIS — R3915 Urgency of urination: Secondary | ICD-10-CM | POA: Diagnosis not present

## 2018-05-08 NOTE — Telephone Encounter (Signed)
Left message on machine for patient to call back.

## 2018-05-08 NOTE — Telephone Encounter (Signed)
Patient made aware. He states freezing episodes unrelated to times he takes Levodopa, so does not sound treated by medication.

## 2018-05-08 NOTE — Telephone Encounter (Signed)
Tried to call again with no answer  

## 2018-05-17 ENCOUNTER — Ambulatory Visit (INDEPENDENT_AMBULATORY_CARE_PROVIDER_SITE_OTHER): Payer: Medicare Other | Admitting: Psychology

## 2018-05-17 DIAGNOSIS — F4323 Adjustment disorder with mixed anxiety and depressed mood: Secondary | ICD-10-CM

## 2018-06-01 ENCOUNTER — Ambulatory Visit (INDEPENDENT_AMBULATORY_CARE_PROVIDER_SITE_OTHER): Payer: Medicare Other | Admitting: Psychology

## 2018-06-01 DIAGNOSIS — F4323 Adjustment disorder with mixed anxiety and depressed mood: Secondary | ICD-10-CM

## 2018-06-05 ENCOUNTER — Telehealth: Payer: Self-pay | Admitting: Neurology

## 2018-06-05 ENCOUNTER — Other Ambulatory Visit: Payer: Self-pay | Admitting: Neurology

## 2018-06-05 MED ORDER — CARBIDOPA-LEVODOPA 25-100 MG PO TABS: ORAL_TABLET | ORAL | 1 refills | Status: DC

## 2018-06-05 MED ORDER — ROPINIROLE HCL 1 MG PO TABS: 1.0000 mg | ORAL_TABLET | Freq: Three times a day (TID) | ORAL | 1 refills | Status: DC

## 2018-06-05 NOTE — Telephone Encounter (Signed)
Needed refill of Carbidopa Levodopa sent to pharmacy with increased dose. RX sent to pharmacy as requested.

## 2018-06-05 NOTE — Telephone Encounter (Signed)
Patient's wife called and left a voicemail stating that she needed to speak with Luvenia Starch asap about her husband. Please call her back at [phone] or [phone]. Thanks.

## 2018-06-14 ENCOUNTER — Telehealth: Payer: Self-pay | Admitting: Neurology

## 2018-06-14 DIAGNOSIS — G20 Parkinson's disease: Secondary | ICD-10-CM

## 2018-06-14 NOTE — Telephone Encounter (Signed)
Order entered

## 2018-06-14 NOTE — Telephone Encounter (Signed)
-----   Message from Sharen Counter, [place] sent at 06/14/2018 11:23 AM EDT ----- Regarding: requesting scripts for evaluations Dr. Toney RakesMurvin Donning is scheduled for OT, PT, and ST re-evaluations on 07-10-18 for treatment of symptoms due to Parkinson's. If agreed, please send referrals for these assessments.   Thank you.  Garald Balding, SLP

## 2018-06-18 ENCOUNTER — Ambulatory Visit (HOSPITAL_COMMUNITY): Payer: Self-pay | Admitting: Psychiatry

## 2018-06-19 DIAGNOSIS — R350 Frequency of micturition: Secondary | ICD-10-CM | POA: Diagnosis not present

## 2018-06-19 DIAGNOSIS — N3941 Urge incontinence: Secondary | ICD-10-CM | POA: Diagnosis not present

## 2018-06-26 ENCOUNTER — Other Ambulatory Visit (HOSPITAL_COMMUNITY): Payer: Self-pay | Admitting: Psychiatry

## 2018-06-26 DIAGNOSIS — F332 Major depressive disorder, recurrent severe without psychotic features: Secondary | ICD-10-CM

## 2018-06-28 ENCOUNTER — Telehealth (HOSPITAL_COMMUNITY): Payer: Self-pay | Admitting: *Deleted

## 2018-06-28 DIAGNOSIS — F332 Major depressive disorder, recurrent severe without psychotic features: Secondary | ICD-10-CM

## 2018-06-28 MED ORDER — CITALOPRAM HYDROBROMIDE 40 MG PO TABS: 40.0000 mg | ORAL_TABLET | Freq: Every day | ORAL | 0 refills | Status: DC

## 2018-06-28 NOTE — Telephone Encounter (Signed)
Wife called stating she called early in week asking for refill of husbands medication (Citalopram) and [facility] not [facility]. Chart reviewed, has appointment 08/20/18,  medication refilled, called to inform patient.

## 2018-07-04 ENCOUNTER — Ambulatory Visit (INDEPENDENT_AMBULATORY_CARE_PROVIDER_SITE_OTHER): Payer: Medicare Other | Admitting: Psychology

## 2018-07-04 DIAGNOSIS — F4323 Adjustment disorder with mixed anxiety and depressed mood: Secondary | ICD-10-CM

## 2018-07-10 ENCOUNTER — Ambulatory Visit: Payer: Medicare Other | Attending: Neurology | Admitting: Speech Pathology

## 2018-07-10 ENCOUNTER — Ambulatory Visit: Payer: Medicare Other | Admitting: Occupational Therapy

## 2018-07-10 ENCOUNTER — Ambulatory Visit: Payer: Medicare Other | Admitting: Physical Therapy

## 2018-07-10 ENCOUNTER — Encounter: Payer: Self-pay | Admitting: Physical Therapy

## 2018-07-10 DIAGNOSIS — R41840 Attention and concentration deficit: Secondary | ICD-10-CM

## 2018-07-10 DIAGNOSIS — R2689 Other abnormalities of gait and mobility: Secondary | ICD-10-CM

## 2018-07-10 DIAGNOSIS — R29898 Other symptoms and signs involving the musculoskeletal system: Secondary | ICD-10-CM | POA: Diagnosis not present

## 2018-07-10 DIAGNOSIS — R29818 Other symptoms and signs involving the nervous system: Secondary | ICD-10-CM

## 2018-07-10 DIAGNOSIS — R41841 Cognitive communication deficit: Secondary | ICD-10-CM | POA: Diagnosis not present

## 2018-07-10 DIAGNOSIS — R293 Abnormal posture: Secondary | ICD-10-CM

## 2018-07-10 DIAGNOSIS — R278 Other lack of coordination: Secondary | ICD-10-CM | POA: Insufficient documentation

## 2018-07-10 DIAGNOSIS — R2681 Unsteadiness on feet: Secondary | ICD-10-CM

## 2018-07-10 DIAGNOSIS — R471 Dysarthria and anarthria: Secondary | ICD-10-CM | POA: Insufficient documentation

## 2018-07-10 NOTE — Therapy (Signed)
[facility] [address] [place], [place], [address] Phone: [phone]   Fax:  [phone]  Physical Therapy Evaluation  Patient Details  Name: [name] [name] MRN: [id number] Date of Birth: May 15, 1951 Referring Provider (PT): Dr. Carles Collet   Encounter Date: 07/10/2018  PT End of Session - 07/10/18 2142    Visit Number  1    Number of Visits  17    Date for PT Re-Evaluation  10/08/18    Authorization Type  Medicare and [place] visits (each discipline counts as 1 visit); 12 used (62 remain)    Authorization - Visit Number  1    Authorization - Number of Visits  --   63 visits remain- PT, OT, speech combined   PT Start Time  0933    PT Stop Time  1017    PT Time Calculation (min)  44 min    Activity Tolerance  Patient tolerated treatment well    Behavior During Therapy  [facility] for tasks assessed/performed       Past Medical History:  Diagnosis Date  .  OSA (obstructive sleep apnea)    npsg 2012:  AHI 67/hr. Auto titration 2012:  Optimal pressure 12cm.   . APPENDECTOMY, HX OF 02/18/2008   Qualifier: Diagnosis of  By: [place], [place]    . Cardiac murmur    as a child  . Chronic idiopathic constipation   . Depression   . Headache(784.0)   . HEADACHES, HX OF 02/18/2008   Qualifier: Diagnosis of  By: Danny Lawless CMA, [place]    . Parkinson disease ([place]) 11/2014  . Sinus mucosal thickening    pt unable to lay flat  . Streptococcal meningitis    as an infant    Past Surgical History:  Procedure Laterality Date  . APPENDECTOMY  1967  . COLONOSCOPY    . NASAL SINUS SURGERY     x 4 as a child  . VASECTOMY      There were no vitals filed for this visit.   Subjective Assessment - 07/10/18 0939    Subjective  Feeling like I'm moving slower, having more freezing, just stiffer.  Have had 3-4 falls in the past 6 months.  My feet got tangled up, or missed the curb step.  Have been able to get up on my own.   Occasionally using walking pole or cane.    Pertinent History  depression, Parkinson's disease    Patient Stated Goals  Pt's goals are to help with getting my foot better (explained this is a new injury and he would need to see MD specifically about that).  Also says to help with stiffness and shuffling.    Currently in Pain?  Yes    Pain Score  3     Pain Location  Foot    Pain Orientation  Left    Pain Descriptors / Indicators  Aching    Pain Type  Acute pain    Pain Onset  In the past 7 days    Pain Frequency  Intermittent    Aggravating Factors   standing/exercises    Pain Relieving Factors  resting         [facility] PT Assessment - 07/10/18 0942      Assessment   Medical Diagnosis  Parkinsons Disease    Referring Provider (PT)  Dr. Carles Collet    Onset Date/Surgical Date  --   01/04/18 PT discharge   Prior Therapy  Rehab off and  on since early 2017 and now in new episode of cre      Precautions   Precautions  Fall      Balance Screen   Has the patient fallen in the past 6 months  Yes    How many times?  3-4    Has the patient had a decrease in activity level because of a fear of falling?   Yes    Is the patient reluctant to leave their home because of a fear of falling?   No      Home Environment   Living Environment  Private residence    Living Arrangements  Spouse/significant other    Available Help at Discharge  Family    Type of [employer] to enter    Entrance Stairs-Number of Steps  5    Entrance Stairs-Rails  Right    Home Layout  Two level;Bed/bath upstairs    Alternate Level Stairs-Number of Steps  12    Alternate Level Stairs-Rails  Right      Prior Function   Level of Independence  Independent with basic ADLs;Independent with household mobility without device;Independent with community mobility without device   Slowed mobility   Vocation  Retired    Leisure  Goes to [employer] 3x/wk, 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  "Started to do  those exercises you've given in the past, again".  Recumbent bike 5 days per week, 30 minutes per day.      Cognition   Overall Cognitive Status  History of cognitive impairments - at baseline      Observation/Other Assessments   Observations  Pt noted to have bilateral pitting edema (+1) in lower extremities-reports this is not managed by anyone he is aware of.  Pt reports pain in L foot (along mid-foot) area, which has been going on for 6 weeks-he questions if it is a sprain.  He has not been to MD.  It is painful with sit to stand and resisted MMT to L ankle.  Advised patient to follow-up with MD for L foot/L ankle pain if it doesn't lessen or if it continues to interfere with mobility.      Focus on Therapeutic Outcomes (FOTO)   NA      Posture/Postural Control   Posture/Postural Control  Postural limitations    Postural Limitations  Rounded Shoulders;Forward head   Holds head tilted to the right side   Posture Comments  Pt unaware he is holding head in this position      ROM / Strength   AROM / PROM / Strength  Strength      Strength   Overall Strength Comments  Grossly tested 4/5 bilateral lower extremities      Transfers   Transfers  Sit to Stand;Stand to Sit    Sit to Stand  6: Modified independent (Device/Increase time);Without upper extremity assist;From chair/3-in-1    Five time sit to stand comments   9.5    Stand to Sit  6: Modified independent (Device/Increase time);Without upper extremity assist;To chair/3-in-1      Ambulation/Gait   Ambulation/Gait  Yes    Ambulation/Gait Assistance  6: Modified independent (Device/Increase time)    Ambulation Distance (Feet)  200 Feet    Assistive device  None    Gait Pattern  Step-through pattern;Decreased arm swing - right;Decreased arm swing - left;Decreased step length - right;Decreased step length - left;Trunk flexed;Narrow base of support;Poor foot clearance -  left;Poor foot clearance - right   Excessive supination L foot    Ambulation Surface  Level;Indoor    Gait velocity  18.53 sec = 1.77 ft/sec      Standardized Balance Assessment   Standardized Balance Assessment  Dynamic Gait Index      Dynamic Gait Index   Level Surface  Moderate Impairment    Change in Gait Speed  Moderate Impairment    Gait with Horizontal Head Turns  Mild Impairment    Gait with Vertical Head Turns  Moderate Impairment    Gait and Pivot Turn  Normal    Step Over Obstacle  Moderate Impairment    Step Around Obstacles  Moderate Impairment    Steps  Mild Impairment    Total Score  12    DGI comment:  Scores <19/24 are indicateive of increased fall risk      Timed Up and Go Test   Normal TUG (seconds)  21.38    Cognitive TUG (seconds)  24.44    TUG Comments  Scores >13.5 seconds indicate increased fall risk;>10% difference indicates difficulty with dual tasking                Objective measurements completed on examination: See above findings.                PT Short Term Goals - 07/10/18 2155      PT SHORT TERM GOAL #1   Title  Pt will be independent with HEP to target Parkinson's specific deficits.  TARGET for all STGs 08/31/18 (due to delayed start for visits after eval)    Time  4    Period  Weeks    Status  New      PT SHORT TERM GOAL #2   Title  Pt will improve TUG score to less than or equal to 15 seconds for decreased fall risk.    Time  4    Period  Weeks    Status  New      PT SHORT TERM GOAL #3   Title  Pt will improve DGI score to at least 15/24 for decreased fall risk.    Time  4    Period  Weeks    Status  New      PT SHORT TERM GOAL #4   Title  Pt will report at least 50% improvement in car transfers.    Time  4    Period  Weeks    Status  New      PT SHORT TERM GOAL #5   Title  Pt will verbalize understanding of fall prevention in home environment.    Time  4    Period  Weeks    Status  New        PT Long Term Goals - 07/10/18 2159      PT LONG TERM GOAL #1    Title  Pt/wife will verbalize understanding of tips to reduce freezing with gait.  UPDATED TARGET for all LTGs 09/28/18    Time  8    Period  Weeks    Status  New      PT LONG TERM GOAL #2   Title  Pt will improve gait velocity score to at least 2 ft/sec for improved gait efficiency and safety.    Time  8    Period  Weeks    Status  New      PT LONG TERM GOAL #3   Title  Pt will improve DGI score to at least 19/24 for decreased fall risk.    Time  8    Period  Weeks    Status  New      PT LONG TERM GOAL #4   Title  Pt will improve TUG cognitive score to less than or equal to 20 seconds for decreased fall risk.    Time  8    Period  Weeks    Status  New             Plan - 07/10/18 2146    Clinical Impression Statement  Pt returns for 6 month evaluation today, with pt d/c'd from PT 01/04/18.  He presents today with history of Parkinson's, depression, 3-4 falls in past 6 months.  He presents with decreased gait speed (slowed from d/c), decreased balance, decreased timing and coordination of gait.  His measures for gait velocity, TUG scores have declined since discharge in April 2019, despite his reports of continuing exercises from HEP.  He is at fall risk per DGI, TUG and gait velocity.  He describes increased freezing epsiodes and increased fear of falling.  He would beneift from skilled PT to address the above stated deficits and decrease fall risk.    History and Personal Factors relevant to plan of care:  3-4 falls in past 6 months, PMH includes depression, OSA, syncope, hyperglycemia    Clinical Presentation  Evolving    Clinical Presentation due to:  hx of PD, slowed mobility measures on gait velocity, TUG, TUG cognitive, fall risk per DGI    Clinical Decision Making  Moderate    Rehab Potential  Good    Clinical Impairments Affecting Rehab Potential  Memory deficits per speech eval notes    PT Frequency  2x / week    PT Duration  8 weeks   plus eval   PT  Treatment/Interventions  ADLs/Self Care Home Management;DME Instruction;Balance training;Therapeutic exercise;Therapeutic activities;Functional mobility training;Gait training;Neuromuscular re-education;Patient/family education    PT Next Visit Plan  Ask if patient has followed up with MD about L foot pain; Review what patient might be doing at home for HEP; need to update HEP for balance strategies-hip, ankle, step strategies, foot clearance, SLS, posturea nd neck ROM    Consulted and Agree with Plan of Care  Patient       Patient will benefit from skilled therapeutic intervention in order to improve the following deficits and impairments:  Abnormal gait, Decreased balance, Decreased mobility, Decreased coordination, Decreased strength, Difficulty walking, Impaired flexibility, Postural dysfunction  Visit Diagnosis: Other abnormalities of gait and mobility  Unsteadiness on feet  Abnormal posture  Other symptoms and signs involving the nervous system  Other symptoms and signs involving the musculoskeletal system     Problem List Patient Active Problem List   Diagnosis Date Noted  . TMJ (temporomandibular joint syndrome) 10/10/2017  . Parkinson's disease ([place]) 03/20/2017  . Bilateral lower extremity edema 03/20/2017  . Chronic venous insufficiency 09/13/2016  . Fatigue 03/15/2016  . Parkinsonian features 09/15/2015  . ED (erectile dysfunction) 09/15/2015  . MDD (major depressive disorder), recurrent episode, severe ([place]) 02/10/2014  . Nonspecific abnormal electrocardiogram (ECG) (EKG) 02/07/2014  . Non-compliant behavior 02/03/2014  . Morton's neuroma of left foot 08/07/2013  . 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 Hyperglycemia 01/26/2012  . Hypogonadism male 01/24/2012  . Syncope and collapse 01/20/2012  .  OSA (obstructive sleep apnea) 01/17/2011  . CHEST TIGHTNESS 02/18/2008  Amere Bricco W. 07/10/2018, 10:03 PM  Frazier Butt.,  PT   [facility] [address] [place], [place], [address] Phone: [phone]   Fax:  [phone]  Name: [name] [name] MRN: [id number] Date of Birth: October 31, 1950

## 2018-07-10 NOTE — Therapy (Signed)
[facility] [address] [place], [place], [address] Phone: [phone]   Fax:  [phone]  Speech Language Pathology Evaluation  Patient Details  Name: [name] [name] MRN: [id number] Date of Birth: 1950/11/06 Referring Provider (SLP): Alonza Bogus, DO   Encounter Date: 07/10/2018  End of Session - 07/10/18 1048    Visit Number  1    Number of Visits  13    Date for SLP Re-Evaluation  08/24/18    SLP Start Time  0847    SLP Stop Time   0931    SLP Time Calculation (min)  44 min    Activity Tolerance  Patient tolerated treatment well       Past Medical History:  Diagnosis Date  .  OSA (obstructive sleep apnea)    npsg 2012:  AHI 67/hr. Auto titration 2012:  Optimal pressure 12cm.   . APPENDECTOMY, HX OF 02/18/2008   Qualifier: Diagnosis of  By: [place], [place]    . Cardiac murmur    as a child  . Chronic idiopathic constipation   . Depression   . Headache(784.0)   . HEADACHES, HX OF 02/18/2008   Qualifier: Diagnosis of  By: Danny Lawless CMA, [place]    . Parkinson disease ([place]) 11/2014  . Sinus mucosal thickening    pt unable to lay flat  . Streptococcal meningitis    as an infant    Past Surgical History:  Procedure Laterality Date  . APPENDECTOMY  1967  . COLONOSCOPY    . NASAL SINUS SURGERY     x 4 as a child  . VASECTOMY      There were no vitals filed for this visit.  Subjective Assessment - 07/10/18 0900    Subjective  "She says I mumble"    Currently in Pain?  Yes    Pain Score  3     Pain Location  Foot    Pain Orientation  Left    Pain Descriptors / Indicators  Aching    Pain Type  Acute pain    Pain Onset  In the past 7 days    Aggravating Factors   standing/exercising    Pain Relieving Factors  resting    Multiple Pain Sites  No         SLP Evaluation [facility] - 07/10/18 0900      SLP Visit Information   SLP Received On  07/10/18    Referring Provider (SLP)  Alonza Bogus,  DO    Onset Date  Diagnosed with PD since 2015      General Information   HPI  [name] [name] is well know to [place] fromprior courses of ST targeting dysarthria and cognition due to PD. Today he reports reduced intelligibility, reduced ability to focus and memory. He reports frustration at home not being able to carry things on the stairs, not being able to help out with chorse he used to do due to being slow mentally and physically.       Balance Screen   Has the patient fallen in the past 6 months  Yes    How many times?  4    Has the patient had a decrease in activity level because of a fear of falling?   No    Is the patient reluctant to leave their home because of a fear of falling?   No      Prior Functional Status   Cognitive/Linguistic Baseline  Baseline deficits  Baseline deficit details  Anomia, attention, memory    Type of Home  House     Lives With  Spouse;Family    Available Support  Family    Vocation  Retired      Associate Professor   Overall Cognitive Status  History of cognitive impairments - at baseline    Area of Impairment  Attention;Memory    Current Attention Level  Sustained    Memory  Decreased short-term memory    Memory Comments  memory issues are likely attention based    Attention  Selective    Selective Attention  Impaired    Selective Attention Impairment  Verbal basic;Functional basic    Awareness  Appears intact    Problem Solving  Appears intact      Oral Motor/Sensory Function   Overall Oral Motor/Sensory Function  Impaired    Labial ROM  Within Functional Limits    Labial Strength  Reduced Right    Labial Coordination  Reduced    Lingual ROM  Reduced right;Reduced left    Lingual Symmetry  Abnormal symmetry right    Lingual Strength  Reduced Left    Velum  Within Functional Limits      Motor Speech   Overall Motor Speech  Impaired    Respiration  Impaired    Level of Impairment  Sentence    Phonation  Low vocal intensity;Hoarse    Resonance  Within  functional limits    Articulation  Impaired    Level of Impairment  Conversation    Intelligibility  Intelligibility reduced    Word  75-100% accurate    Phrase  75-100% accurate    Sentence  75-100% accurate    Conversation  75-100% accurate    Effective Techniques  Slow rate;Increased vocal intensity                      SLP Education - 07/10/18 1045    Education provided  Yes    Education Details  compensations for attention and memory; compensations for dysarthria; HEP for dysarthria    Person(s) Educated  Patient    Methods  Explanation;Demonstration;Verbal cues;Handout    Comprehension  Verbalized understanding;Returned demonstration;Verbal cues required;Tactile cues required       SLP Short Term Goals - 07/10/18 1304      SLP SHORT TERM GOAL #1   Title  Pt will carryover 2 compensations for attention/memory at home to complete daily chores/tasks with occasional min A over 2 sessions    Time  3    Period  Weeks    Status  New      SLP SHORT TERM GOAL #2   Title  Pt will report carryover of compensations for slow processing in coversations (comprehension and expression) with occasional min A over 2 sessions    Time  3    Period  Weeks    Status  New      SLP SHORT TERM GOAL #3   Title  Pt will utilize compensations for dysarthria in structured tasks with occasional min A in three sessions    Time  3    Period  Weeks    Status  New      SLP SHORT TERM GOAL #4   Title  Formal cognitive linguistic assessment as indicated    Time  2    Period  Weeks    Status  New       SLP Long Term Goals - 07/10/18 1056  SLP LONG TERM GOAL #1   Title  pt will demo attention skills adequate for functional verbal expression in 10 minutes simple-mod complex conversation over two sessions    Time  6    Period  Weeks    Status  New      SLP LONG TERM GOAL #2   Title  Pt will be intelligible in 15 minute converation in mildly noisy environment with occasional  min A over 2 sessions    Time  6    Period  Weeks      SLP LONG TERM GOAL #3   Title  Pt will report 25% reduction (subjectively) in requests for repetition or being told he is mumbling by his spouse over 3 sessions    Time  6    Status  New      SLP LONG TERM GOAL #4   Title  Pt will reports carryover of environmental compensations for improved intelligibility outside of therapy over 2 sessions    Time  [place] - 07/10/18 1304    Clinical Impression Statement  Davyon reports his wife tells him he is "mumbling" throughout the day. This causes frustration and tension between them. He reports starting a task, getting distracted and forgetting to go back to the task,. Then he will come upon the incomplete task later in the day and realize he didn't  complete it. He states this happens several times a day. Pt also reports "phlegm" in the back of his throat which results in drooling. Denies s/s of dysphagia with meals. Pt is intelligible in this quiet environment, with slow processing in conversation and in response to questions evident. Prior wit/humor remains intact but slower.    Oral motor assessment revealed reduced leftlingual ROM and WNL lingual strength. Labial ROM was reduced rightand strength was [facility]. Velar ROM appeared WNL.   Measured when a sound level meter was placed 30 cm away from pt's mouth, 10 minutes of conversational speech was reduced today, at average 69dB (WNL= average 70-72dB) with range of 67 to 71dB. Overall speech intelligibility for this listener in a quiet environment was not affected, at approx 100%. Production of loud /a/ averaged 80dB (range of 75  to 85) and min cues usually needed for loudness.   Pt then rated effort level at 4/10 for production of loud /a/ (10=maximal effort). In sentence  task pt was asked to use the same amount of effort as with loud /a/. Loudness average with this increased effort was 70dB (range of 68 to  72) with min A occasionally for loudness. Pt would benefit from skilled ST in order to improve speech intelligibility, and carryover of compensations for cognitive impairments. Pt's volume is consistent with pt's prior course of ST, however as pt is experiencing difficulty communicating at home, skilled ST warranted for dysarthria         Patient will benefit from skilled therapeutic intervention in order to improve the following deficits and impairments:   Dysarthria - Plan: SLP plan of care cert/re-cert  Cognitive communication deficit - Plan: SLP plan of care cert/re-cert    Problem List Patient Active Problem List   Diagnosis Date Noted  . TMJ (temporomandibular joint syndrome) 10/10/2017  . Parkinson's disease ([place]) 03/20/2017  . Bilateral lower extremity edema 03/20/2017  . Chronic venous insufficiency 09/13/2016  . Fatigue 03/15/2016  . Parkinsonian features 09/15/2015  .  ED (erectile dysfunction) 09/15/2015  . MDD (major depressive disorder), recurrent episode, severe ([place]) 02/10/2014  . Nonspecific abnormal electrocardiogram (ECG) (EKG) 02/07/2014  . Non-compliant behavior 02/03/2014  . Morton's neuroma of left foot 08/07/2013  . Attention deficit disorder without mention of hyperactivity 03/13/2012  . Hyperglycemia 01/26/2012  . Hypogonadism male 01/24/2012  . Syncope and collapse 01/20/2012  .  OSA (obstructive sleep apnea) 01/17/2011  . CHEST TIGHTNESS 02/18/2008    Lovvorn, Annye Rusk MS, CCC-SLP 07/10/2018, 1:05 PM  [facility] [address] [place], [place], [address] Phone: [phone]   Fax:  [phone]  Name: MOKSH LOOMER MRN: [id number] Date of Birth: [birth year]

## 2018-07-10 NOTE — Patient Instructions (Signed)
   Get back to [facility]!! 5x twice daily with a big breath first  When you wife says you are mumbling, take a big breath to generate good volume and project your voice  If you are having trouble finishing tasks, see if your wife can make a list of 2-4 things you need to do so you can go back and check if your tasks are complete    Get the persons attention before you speak  Use eye contact and face the person you are speaking to  Be in close proximity to the person you are speaking to  Turn down any noise in the environment such as the TV, walk away from loud appliances, air conditioners, fans, dish washers etc  You need extra a few seconds to process what others are saying and what you want to say in return - let people know not to interrupt you  You are doing a good job using water to reduce your drooling. Also try a hard candy or using a loose rubber band on your wrist to remind you to swallow more frequently  Great job with the cross words and jumble - keep it up!   Cognitive Activities you can do at home:   - Solitaire  - Leonardo  - Chess/Checkers  - Crosswords (easy level)  - Ness  - jig saws

## 2018-07-12 NOTE — Therapy (Signed)
[facility] [address] [place], [place], [address] Phone: [phone]   Fax:  [phone]  Occupational Therapy Evaluation  Patient Details  Name: [name] [name] MRN: [id number] Date of Birth: Apr 14, 1951 Referring Provider (OT): Dr. Carles Collet   Encounter Date: 07/10/2018  OT End of Session - 07/12/18 1628    Visit Number  1    Number of Visits  17    Date for OT Re-Evaluation  09/08/18    Authorization Type  Medicare and federal BCBS    Authorization - Visit Number  13    Authorization - Number of Visits  --   29 total visits remaining for all disciplines.   OT Start Time  [phone]    OT Stop Time  0845    OT Time Calculation (min)  38 min    Activity Tolerance  Patient tolerated treatment well    Behavior During Therapy  WFL for tasks assessed/performed       Past Medical History:  Diagnosis Date  .  OSA (obstructive sleep apnea)    npsg 2012:  AHI 67/hr. Auto titration 2012:  Optimal pressure 12cm.   . APPENDECTOMY, HX OF 02/18/2008   Qualifier: Diagnosis of  By: [place], [place]    . Cardiac murmur    as a child  . Chronic idiopathic constipation   . Depression   . Headache(784.0)   . HEADACHES, HX OF 02/18/2008   Qualifier: Diagnosis of  By: Danny Lawless CMA, [place]    . Parkinson disease ([place]) 11/2014  . Sinus mucosal thickening    pt unable to lay flat  . Streptococcal meningitis    as an infant    Past Surgical History:  Procedure Laterality Date  . APPENDECTOMY  1967  . COLONOSCOPY    . NASAL SINUS SURGERY     x 4 as a child  . VASECTOMY      There were no vitals filed for this visit.  Subjective Assessment - 07/12/18 1627    Subjective   Pt reports more freezing episodes    Pertinent History  Parkinson's disease, seeing illusions, hx of falls, hx of significant depression, mild cognitive impairment, sleep apnea, hypogonadism, ADD    Patient Stated Goals  improved balance and ease with ADLs     Currently in Pain?  Yes    Pain Score  3     Pain Location  Foot    Pain Orientation  Left    Pain Descriptors / Indicators  Aching    Pain Type  Acute pain    Pain Onset  In the past 7 days    Pain Frequency  Intermittent    Aggravating Factors   standing    Pain Relieving Factors  rest        [facility] OT Assessment - 07/12/18 0001      Assessment   Medical Diagnosis  Parkinsons Disease    Referring Provider (OT)  Dr. Carles Collet    Onset Date/Surgical Date  06/14/18    Prior Therapy  Rehab off and on since early 2017 and now in new episode of cre      Precautions   Precautions  Fall      Balance Screen   Has the patient fallen in the past 6 months  Yes    How many times?  4    Has the patient had a decrease in activity level because of a fear of falling?   No  Is the patient reluctant to leave their home because of a fear of falling?   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  "Started to do those exercises you've given in the past, again".  Recumbent bike 5 days per week, 30 minutes per day.      ADL   Eating/Feeding  Modified independent    Grooming  Modified independent    Upper Body Bathing  Modified independent   increased time   Lower Body Bathing  Modified independent    Upper Body Dressing  Increased time;Minimal assistance   orienting shirt in correct direction   Lower Body Dressing  Minimal assistance    Toilet Transfer  Modified independent    Tub/Shower Transfer  Modified independent    ADL comments  Pt reports increased freezing episodes, and time required and  difficulty with correct orientation of clothing      IADL   Light Housekeeping  Performs light daily tasks such as dishwashing, bed making    Meal Prep  Able to complete simple cold meal and snack prep    Medication Management  Takes responsibility if medication is prepared in advance in seperate dosage      Mobility   Mobility Status  Freezing;History of falls   modified independent     Vision - History   Additional Comments  blurry vision      Vision Assessment   Vision Assessment  Vision impaired  _ to be further tested in functional context      Cognition   Overall Cognitive Status  Impaired/Different from baseline    Area of Impairment  Attention;Memory    Memory  Decreased short-term memory    Attention  Selective    Selective Attention Impairment  Verbal basic;Functional basic      Observation/Other Assessments   Simulated Eating Time (seconds)  15.31 secs    Donning Doffing Jacket Time (seconds)  39.53 secs    Donning Doffing Jacket Comments  3 button/ unbutton 29.19 secs      Posture/Postural Control   Posture/Postural Control  Postural limitations    Postural Limitations  Rounded Shoulders;Forward head      Coordination   Gross Motor Movements are Fluid and Coordinated  No    Fine Motor Movements are Fluid and Coordinated  No    Right 9 Hole Peg Test  38.40 secs    Left 9 Hole Peg Test  50.07 secs    Box and Blocks  RUE 54, LUE 55      AROM   Overall AROM   Within functional limits for tasks performed    Overall AROM Comments  Mildly decreased bilateral supination 90%, grossly -10 elbow ext bilaterally, RUE shoulder flexion 140, LUE shoulder flexion 135                        OT Short Term Goals - 07/10/18 1427      OT SHORT TERM GOAL #1   Title  Pt  will be independent with PD specific  HEP.--    Time  4    Period  Weeks    Status  New      OT SHORT TERM GOAL #2   Title  Pt will verbalize understanding of ways to prevent future  complications and current available/appropriate community resources prn.    Time  4    Period  Weeks    Status  New      OT SHORT TERM GOAL #3   Title  Pt will demonstrate improved fine motor coordination for ADLS as evidenced by decreasing LUE 9 hole peg test score to 47 secs or less.    Baseline  RUE 38.40, LUE 50.07 secs    Time  4    Period  Weeks    Status  New      OT SHORT TERM GOAL #4   Title  Pt will report incr ease with donning shirt, pants socks/shoes using adaptive strategies/AE prn.    Time  4    Period  Weeks    Status  New        OT Long Term Goals - 07/10/18 1430      OT LONG TERM GOAL #1   Title  Pt will verbalize understanding of adapted strategies for ADLs/IADLs including strategies to minimize freezing episodes    Time  8    Period  Weeks    Status  New      OT LONG TERM GOAL #2   Title  Pt will demonstrate improved ease with donning / doffing jacket as evidenced by decreasing PPT# 4 to 35 secs or less.    Time  8    Period  Weeks    Status  New      OT LONG TERM GOAL #3   Title  ------------------------------      OT LONG TERM GOAL #4   Title  ------------------------------------------      OT LONG TERM GOAL #5   Title  ---------------------            Plan - 07/12/18 1637    Clinical Impression Statement  Pt is a 67 y.o. male with Parkinson's disease with diagnosis of Parkinson's disease who returns to occupational therapy with a decline in ADL/IADLS Pt presents today with bradykinesia, rigidity, decr functional mobility/balance for ADLs/IADLs, decr coordination, cognitive deficits, abnormal posture. Pt would benefit from occupational therapy to address these deficits in order to prevent future complications and improve ADL/IADL performance (incr ease and safety).    Occupational Profile and client history currently impacting functional performance  PMH that includes: seeing illusions, hx of falls, hx of significant depression, mild  cognitive impairment, sleep apnea, hypogonadism, ADD. Pt reports recent falls and increased difficulty with orienting clothes to donn them.Pt retired due to difficulty at work and no longer drives. Pt reports slowing of ADLs and incr cognitive deficits impacting function.     Occupational performance deficits (Please refer to evaluation for details):  ADL's;IADL's;Leisure;Social Participation    Rehab Potential  Good    Current Impairments/barriers affecting progress:  cognitive deficits, depression, decreased balance, freezing episodes    OT Frequency  2x / week   anticipate d/c after 4 weeks   OT Duration  8 weeks    OT Treatment/Interventions  Self-care/ADL training;Moist Heat;Fluidtherapy;Balance training;Therapeutic activities;Cognitive remediation/compensation;Therapeutic exercise;Ultrasound;Cryotherapy;Neuromuscular education;Visual/perceptual remediation/compensation;Passive range of motion;Functional Mobility Training;Patient/family education;Manual Therapy;Energy conservation;Paraffin;DME and/or AE instruction;Aquatic Therapy    Plan  initiate HEP    Clinical Decision  Making  Several treatment options, min-mod task modification necessary    Consulted and Agree with Plan of Care  Patient       Patient will benefit from skilled therapeutic intervention in order to improve the following deficits and impairments:  Decreased cognition, Impaired flexibility, Decreased mobility, Decreased coordination, Decreased endurance, Decreased range of motion, Decreased strength, Impaired UE functional use, Impaired tone, Impaired perceived functional ability, Decreased safety awareness, Difficulty walking, Decreased balance, Decreased activity tolerance, Impaired vision/preception, Improper spinal/pelvic alignment  Visit Diagnosis: Other lack of coordination - Plan: Ot plan of care cert/re-cert  Attention and concentration deficit - Plan: Ot plan of care cert/re-cert  Other symptoms and signs  involving the musculoskeletal system - Plan: Ot plan of care cert/re-cert  Other symptoms and signs involving the nervous system - Plan: Ot plan of care cert/re-cert  Unsteadiness on feet - Plan: Ot plan of care cert/re-cert  Other abnormalities of gait and mobility - Plan: Ot plan of care cert/re-cert  Abnormal posture - Plan: Ot plan of care cert/re-cert    Problem List Patient Active Problem List   Diagnosis Date Noted  . TMJ (temporomandibular joint syndrome) 10/10/2017  . Parkinson's disease ([place]) 03/20/2017  . Bilateral lower extremity edema 03/20/2017  . Chronic venous insufficiency 09/13/2016  . Fatigue 03/15/2016  . Parkinsonian features 09/15/2015  . ED (erectile dysfunction) 09/15/2015  . MDD (major depressive disorder), recurrent episode, severe ([place]) 02/10/2014  . Nonspecific abnormal electrocardiogram (ECG) (EKG) 02/07/2014  . Non-compliant behavior 02/03/2014  . Morton's neuroma of left foot 08/07/2013  . Attention deficit disorder without mention of hyperactivity 03/13/2012  . Hyperglycemia 01/26/2012  . Hypogonadism male 01/24/2012  . Syncope and collapse 01/20/2012  .  OSA (obstructive sleep apnea) 01/17/2011  . CHEST TIGHTNESS 02/18/2008    [facility] 07/12/2018, 4:40 PM Theone Murdoch, OTR/L [phone] Phone: [phone] 4:40 PM 07/12/18 [facility] [address] [place], [place], [address] Phone: [phone]   Fax:  [phone]  Name: [name] [name] MRN: [id number] Date of Birth: 26-Jan-1951

## 2018-07-17 DIAGNOSIS — R350 Frequency of micturition: Secondary | ICD-10-CM | POA: Diagnosis not present

## 2018-07-17 DIAGNOSIS — R3915 Urgency of urination: Secondary | ICD-10-CM | POA: Diagnosis not present

## 2018-07-23 ENCOUNTER — Ambulatory Visit (INDEPENDENT_AMBULATORY_CARE_PROVIDER_SITE_OTHER): Payer: Medicare Other | Admitting: Psychology

## 2018-07-23 DIAGNOSIS — F4323 Adjustment disorder with mixed anxiety and depressed mood: Secondary | ICD-10-CM | POA: Diagnosis not present

## 2018-07-25 DIAGNOSIS — R351 Nocturia: Secondary | ICD-10-CM | POA: Diagnosis not present

## 2018-07-25 DIAGNOSIS — N3941 Urge incontinence: Secondary | ICD-10-CM | POA: Diagnosis not present

## 2018-07-26 ENCOUNTER — Ambulatory Visit (INDEPENDENT_AMBULATORY_CARE_PROVIDER_SITE_OTHER)
Admission: RE | Admit: 2018-07-26 | Discharge: 2018-07-26 | Disposition: A | Discharge status: Home / Self Care | Payer: Medicare Other | Source: Ambulatory Visit | Attending: Internal Medicine | Admitting: Internal Medicine

## 2018-07-26 ENCOUNTER — Encounter: Payer: Self-pay | Admitting: Internal Medicine

## 2018-07-26 ENCOUNTER — Ambulatory Visit (INDEPENDENT_AMBULATORY_CARE_PROVIDER_SITE_OTHER): Payer: Medicare Other | Admitting: Internal Medicine

## 2018-07-26 VITALS — BP 118/76 | HR 82 | Temp 97.8°F | Ht 74.0 in | Wt 305.0 lb

## 2018-07-26 DIAGNOSIS — S92352D Displaced fracture of fifth metatarsal bone, left foot, subsequent encounter for fracture with routine healing: Secondary | ICD-10-CM | POA: Diagnosis not present

## 2018-07-26 DIAGNOSIS — M79672 Pain in left foot: Secondary | ICD-10-CM | POA: Diagnosis not present

## 2018-07-26 MED ORDER — SILDENAFIL CITRATE 100 MG PO TABS: 50.0000 mg | ORAL_TABLET | Freq: Every day | ORAL | 2 refills | Status: DC | PRN

## 2018-07-26 NOTE — Assessment & Plan Note (Signed)
Suspect morton's neuroma versus stress fracture. Pain is mostly 3-4th interdigit region left foot. X-ray ordered to rule out fracture.

## 2018-07-26 NOTE — Patient Instructions (Signed)
We are checking the x-ray today to check for stress fracture in the foot. If there is no fracture this is likely a morton's neuroma.   Morton Neuralgia Morton neuralgia is a type of foot pain in the area closest to your toes. This area is sometimes called the ball of your foot. Morton neuralgia occurs when a branch of a nerve in your foot (digital nerve) becomes compressed. When this happens over a long period of time, the nerve can thicken (neuroma) and cause pain. This usually occurs between the third and fourth toe. Morton neuralgia can come and go but may get worse over time. What are the causes? Your digital nerve can become compressed and stretched at a point where it passes under a thick band of tissue that connects your toes (intermetatarsal ligament). Morton neuralgia can be caused by mild repetitive damage in this area. This type of damage can result from:  Activities such as running or jumping.  Wearing shoes that are too tight.  What increases the risk? You may be at risk for Morton neuralgia if you:  Are male.  Wear high heels.  Wear shoes that are narrow or tight.  Participate in activities that stretch your toes. These include: ? Running. ? Tropic. ? Long-distance walking.  What are the signs or symptoms? The first symptom of Morton neuralgia is pain that spreads from the ball of your foot to your toes. It may feel like you are walking on a marble. Pain usually gets worse with walking and goes away at night. Other symptoms may include numbness and cramping of your toes. How is this diagnosed? Your health care provider will do a physical exam. When doing the exam, your health care provider may:  Squeeze your foot just behind your toe.  Ask you to move your toes to check for pain.  You may also have tests on your foot to confirm the diagnosis. These may include:  An X-ray.  An MRI.  How is this treated? Treatment for Morton neuralgia may be as simple as  changing the kind of shoes you wear. Other treatments may include:  Wearing a supportive pad (orthosis) under the front of your foot. This lifts your toe bones and takes pressure off the nerve.  Getting injections of numbing medicine and anti-inflammatory medicine (steroid) in the nerve.  Having surgery to remove part of the thickened nerve.  Follow these instructions at home:  Take medicine only as directed by your health care provider.  Wear soft-soled shoes with a wide toe area.  Stop activities that may be causing pain.  Elevate your foot when resting.  Massage your foot.  Apply ice to the injured area: ? Put ice in a plastic bag. ? Place a towel between your skin and the bag. ? Leave the ice on for 20 minutes, 2-3 times a day.  Keep all follow-up visits as directed by your health care provider. This is important. Contact a health care provider if:  Home care instructions are not helping you get better.  Your symptoms change or get worse. This information is not intended to replace advice given to you by your health care provider. Make sure you discuss any questions you have with your health care provider. Document Released: 12/26/2000 Document Revised: 02/25/2016 Document Reviewed: 11/20/2013 Elsevier Interactive Patient Education  [employer].

## 2018-07-26 NOTE — Progress Notes (Signed)
   Subjective:    Patient ID: [name] [name], male    DOB: Sep 14, 1951, 67 y.o.   MRN: [id number]  HPI The patient is a 67 YO man coming in for left foot pain. Started about 2 months ago. He has tried nothing for it. Sometimes it is okay if he is resting and off it. When he is walking a lot of doing activity it starts hurting worse. Pain 5/10 at worst. Denies swelling or color changes. Denies injury or overuse known. No change in foot ware. Overall stable since onset. Unbeknowst to patient morton's neuroma left foot diagnosed in 2014 (per chart review).  Review of Systems  Constitutional: Positive for activity change. Negative for appetite change, chills, fatigue, fever and unexpected weight change.  Respiratory: Negative for cough, chest tightness and shortness of breath.   Cardiovascular: Negative for chest pain, palpitations and leg swelling.  Gastrointestinal: Negative for abdominal distention, abdominal pain, constipation, diarrhea, nausea and vomiting.  Musculoskeletal: Positive for arthralgias and myalgias.  Skin: Negative.   Neurological: Negative.   Psychiatric/Behavioral: Negative.       Objective:   Physical Exam  Constitutional: He is oriented to person, place, and time. He appears well-developed and well-nourished.  HENT:  Head: Normocephalic and atraumatic.  Eyes: EOM are normal.  Neck: Normal range of motion.  Cardiovascular: Normal rate and regular rhythm.  Pulmonary/Chest: Effort normal and breath sounds normal. No respiratory distress. He has no wheezes. He has no rales.  Musculoskeletal: He exhibits tenderness. He exhibits no edema.  Pain left foot 3-4 interdigit near the toes, some pain lateral forefoot. No swelling or redness appreciated.   Neurological: He is alert and oriented to person, place, and time. Coordination normal.  Skin: Skin is warm and dry.   Vitals:   07/26/18 0937  BP: 118/76  Pulse: 82  Temp: 97.8 F (36.6 C)  TempSrc: Oral  SpO2: 97%    Weight: (!) 305 lb (138.3 kg)  Height: 6\' 2"  (1.88 m)      Assessment & Plan:

## 2018-08-03 ENCOUNTER — Encounter: Payer: Self-pay | Admitting: Physical Therapy

## 2018-08-03 ENCOUNTER — Ambulatory Visit: Payer: Medicare Other | Attending: Neurology | Admitting: Occupational Therapy

## 2018-08-03 ENCOUNTER — Ambulatory Visit: Payer: Medicare Other

## 2018-08-03 ENCOUNTER — Ambulatory Visit: Payer: Medicare Other | Admitting: Physical Therapy

## 2018-08-03 DIAGNOSIS — R41841 Cognitive communication deficit: Secondary | ICD-10-CM | POA: Insufficient documentation

## 2018-08-03 DIAGNOSIS — R4701 Aphasia: Secondary | ICD-10-CM | POA: Diagnosis not present

## 2018-08-03 DIAGNOSIS — R29818 Other symptoms and signs involving the nervous system: Secondary | ICD-10-CM

## 2018-08-03 DIAGNOSIS — R41840 Attention and concentration deficit: Secondary | ICD-10-CM | POA: Diagnosis not present

## 2018-08-03 DIAGNOSIS — R293 Abnormal posture: Secondary | ICD-10-CM | POA: Insufficient documentation

## 2018-08-03 DIAGNOSIS — R471 Dysarthria and anarthria: Secondary | ICD-10-CM

## 2018-08-03 DIAGNOSIS — R278 Other lack of coordination: Secondary | ICD-10-CM | POA: Insufficient documentation

## 2018-08-03 DIAGNOSIS — R2689 Other abnormalities of gait and mobility: Secondary | ICD-10-CM

## 2018-08-03 DIAGNOSIS — R2681 Unsteadiness on feet: Secondary | ICD-10-CM | POA: Diagnosis not present

## 2018-08-03 DIAGNOSIS — R29898 Other symptoms and signs involving the musculoskeletal system: Secondary | ICD-10-CM

## 2018-08-03 NOTE — Therapy (Signed)
[facility] [address] [place], [place], [address] Phone: [phone]   Fax:  [phone]  Speech Language Pathology Treatment  Patient Details  Name: [name] [name] MRN: [id number] Date of Birth: 11-11-1950 Referring Provider (SLP): Alonza Bogus, DO   Encounter Date: 08/03/2018  End of Session - 08/03/18 1711    Visit Number  2    Number of Visits  13    Date for SLP Re-Evaluation  08/24/18    SLP Start Time  9983    SLP Stop Time   1445    SLP Time Calculation (min)  41 min    Activity Tolerance  Patient tolerated treatment well       Past Medical History:  Diagnosis Date  .  OSA (obstructive sleep apnea)    npsg 2012:  AHI 67/hr. Auto titration 2012:  Optimal pressure 12cm.   . APPENDECTOMY, HX OF 02/18/2008   Qualifier: Diagnosis of  By: [place], [place]    . Cardiac murmur    as a child  . Chronic idiopathic constipation   . Depression   . Headache(784.0)   . HEADACHES, HX OF 02/18/2008   Qualifier: Diagnosis of  By: Danny Lawless CMA, [place]    . Parkinson disease ([place]) 11/2014  . Sinus mucosal thickening    pt unable to lay flat  . Streptococcal meningitis    as an infant    Past Surgical History:  Procedure Laterality Date  . APPENDECTOMY  1967  . COLONOSCOPY    . NASAL SINUS SURGERY     x 4 as a child  . VASECTOMY      There were no vitals filed for this visit.  Subjective Assessment - 08/03/18 1402    Subjective  "[place]."    Currently in Pain?  Yes    Pain Score  4     Pain Location  Foot    Pain Orientation  Left    Pain Descriptors / Indicators  Aching    Pain Type  Acute pain    Pain Onset  1 to 4 weeks ago    Pain Frequency  Intermittent    Aggravating Factors   standing up     Pain Relieving Factors  rest            ADULT SLP TREATMENT - 08/03/18 1407      General Information   Behavior/Cognition  Alert;Cooperative;Pleasant mood      Treatment  Provided   Treatment provided  Cognitive-Linquistic      Cognitive-Linquistic Treatment   Treatment focused on  Dysarthria;Aphasia    Skilled Treatment  Pt conversation with SLP initially in session was mid-upper 30s. Pt admitted that he has not done loud /a/ as directed. SLP suggested to pt to pair loud /a/ with 2 things he does every day. SLP assisted pt reason through when he could accomplish loud /a/ a second time each day- pt stated he was usually getting them completed once/day. Today SLP used loud "Hey-ahhh" to recalibrate conversational loudness with average mid -upper 80s dB with hoarse voice. SLP had pt read portions of his paper he brought with him (horoscopes) with low 70s dB. In structured tasks with min-mod cognitive load pt's volume suffered at average mid 60s dB.      Assessment / Recommendations / Plan   Plan  Continue with current plan of care      Progression Toward Goals   Progression toward goals  Progressing  toward goals       SLP Education - 08/03/18 1711    Education provided  Yes    Education Details  abdominal breathing, full breath with loud "hey - ahhhh"    Person(s) Educated  Patient    Methods  Explanation;Demonstration;Verbal cues    Comprehension  Verbal cues required;Returned demonstration;Verbalized understanding;Need further instruction       SLP Short Term Goals - 08/03/18 1715      SLP SHORT TERM GOAL #1   Title  Pt will carryover 2 compensations for attention/memory at home to complete daily chores/tasks with occasional min A over 2 sessions    Time  3    Period  Weeks    Status  On-going      SLP SHORT TERM GOAL #2   Title  Pt will report carryover of compensations for slow processing in coversations (comprehension and expression) with occasional min A over 2 sessions    Time  3    Period  Weeks    Status  On-going      SLP SHORT TERM GOAL #3   Title  Pt will utilize compensations for dysarthria in structured tasks with occasional min A in  three sessions    Time  3    Period  Weeks    Status  On-going      SLP SHORT TERM GOAL #4   Title  Formal cognitive linguistic assessment as indicated    Time  2    Period  Weeks    Status  New       SLP Long Term Goals - 08/03/18 1715      SLP LONG TERM GOAL #1   Title  pt will demo attention skills adequate for functional verbal expression in 10 minutes simple-mod complex conversation over two sessions    Time  6    Period  Weeks    Status  On-going      SLP LONG TERM GOAL #2   Title  Pt will be intelligible in 15 minute converation in mildly noisy environment with occasional min A over 2 sessions    Time  6    Period  Weeks    Status  On-going      SLP LONG TERM GOAL #3   Title  Pt will report 25% reduction (subjectively) in requests for repetition or being told he is mumbling by his spouse over 3 sessions    Time  6    Status  On-going      SLP LONG TERM GOAL #4   Title  Pt will reports carryover of environmental compensations for improved intelligibility outside of therapy over 2 sessions    Time  6    Period  Weeks    Status  On-going       Plan - 08/03/18 1712    Clinical Impression Statement  Pt with suboptimal loudness in conversation especially with incr'd cognitive load. In pleasantries and light social conversation pt's volume and verbal expression is WNL/WFL. See "skilled intervention" for details. Pt would cont to benefit from skilled ST to target language, speech, and cognition.     Speech Therapy Frequency  2x / week       Patient will benefit from skilled therapeutic intervention in order to improve the following deficits and impairments:   Dysarthria and anarthria  Cognitive communication deficit  Aphasia    Problem List Patient Active Problem List   Diagnosis Date Noted  . Left foot pain  07/26/2018  . TMJ (temporomandibular joint syndrome) 10/10/2017  . Parkinson's disease ([place]) 03/20/2017  . Bilateral lower extremity edema 03/20/2017   . Chronic venous insufficiency 09/13/2016  . Fatigue 03/15/2016  . Parkinsonian features 09/15/2015  . ED (erectile dysfunction) 09/15/2015  . MDD (major depressive disorder), recurrent episode, severe ([place]) 02/10/2014  . Nonspecific abnormal electrocardiogram (ECG) (EKG) 02/07/2014  . Non-compliant behavior 02/03/2014  . Morton's neuroma of left foot 08/07/2013  . Attention deficit disorder without mention of hyperactivity 03/13/2012  . Hyperglycemia 01/26/2012  . Hypogonadism male 01/24/2012  . Syncope and collapse 01/20/2012  .  OSA (obstructive sleep apnea) 01/17/2011  . CHEST TIGHTNESS 02/18/2008    Nazanin Kinner ,MS, CCC-SLP  08/03/2018, 5:16 PM  [facility] [address] [place], [place], [address] Phone: [phone]   Fax:  [phone]   Name: [name] [name] MRN: [id number] Date of Birth: June 09, 1951

## 2018-08-03 NOTE — Patient Instructions (Signed)
Coordination Exercises  Perform the following exercises for 20 minutes 1 times per day. Perform with both hand(s). Perform using big movements.   Flipping Cards: Place deck of cards on the table. Flip cards over by opening your hand big to grasp and then turn your palm up big.  Deal cards: Hold 1/2 or whole deck in your hand. Use thumb to push card off top of deck with one big push.  Pick up coins and stack one at a time: Pick up with big, intentional movements. Do not drag coin to the edge. (5-10 in a stack)  Pick up 5-10 coins one at a time and hold in palm. Then, move coins from palm to fingertips one at a time to stack.  Practice writing: Slow down, write big, and focus on forming each letter.  Perform "Flicks"/hand stretches (PWR! Hands): Close hands then flick out your fingers with focus on opening hands, pulling wrists back, and extending elbows like you are pushing.

## 2018-08-03 NOTE — Therapy (Signed)
[facility] [address] [place], [place], [address] Phone: [phone]   Fax:  [phone]  Physical Therapy Treatment  Patient Details  Name: [name] [name] MRN: [id number] Date of Birth: 03-22-1951 Referring Provider ([name] [name]): Dr. Carles Collet   Encounter Date: 08/03/2018  [name] [name] End of Session - 08/03/18 1805    Visit Number  2    Number of Visits  17    Date for [name] [name] Re-Evaluation  10/08/18    Authorization Type  Medicare and [place] visits (each discipline counts as 1 visit); 12 used (63 remain)    Authorization - Visit Number  6   [name] [name]/OT/SLP thru 08/03/18   Authorization - Number of Visits  63   63 visits remain- [name] [name], OT, speech combined   [name] [name] Start Time  1530    [name] [name] Stop Time  1617    [name] [name] Time Calculation (min)  47 min    Activity Tolerance  Patient tolerated treatment well    Behavior During Therapy  [facility] for tasks assessed/performed       Past Medical History:  Diagnosis Date  .  OSA (obstructive sleep apnea)    npsg 2012:  AHI 67/hr. Auto titration 2012:  Optimal pressure 12cm.   . APPENDECTOMY, HX OF 02/18/2008   Qualifier: Diagnosis of  By: [place], [place]    . Cardiac murmur    as a child  . Chronic idiopathic constipation   . Depression   . Headache(784.0)   . HEADACHES, HX OF 02/18/2008   Qualifier: Diagnosis of  By: Danny Lawless CMA, [place]    . Parkinson disease ([place]) 11/2014  . Sinus mucosal thickening    [name] [name] unable to lay flat  . Streptococcal meningitis    as an infant    Past Surgical History:  Procedure Laterality Date  . APPENDECTOMY  1967  . COLONOSCOPY    . NASAL SINUS SURGERY     x 4 as a child  . VASECTOMY      There were no vitals filed for this visit.  Subjective Assessment - 08/03/18 1535    Subjective  Slower and stiffer feeling. Freezing more. No falls since he was here last. Saw the MD and had xray left foot with "healing fx base Left 5th metatarsal." No restrictions or special  shoe given. He reports going up/down steps at home is one of the most painful.     Pertinent History  depression, Parkinson's disease    Patient Stated Goals  [name] [name]'s goals are to help with getting my foot better (explained this is a new injury and he would need to see MD specifically about that).  Also says to help with stiffness and shuffling.    Currently in Pain?  Yes    Pain Score  --   2.5   Pain Location  Foot    Pain Orientation  Left    Pain Descriptors / Indicators  Aching    Pain Type  Acute pain    Pain Onset  1 to 4 weeks ago    Pain Frequency  Intermittent    Aggravating Factors   stnding, walking, stairs especially     Pain Relieving Factors  rest                       [facility] Adult [name] [name] Treatment/Exercise - 08/03/18 1549      Transfers   Transfers  Sit to Stand;Stand to Sit;Floor to Transfer  Sit to Stand  6: Modified independent (Device/Increase time);Without upper extremity assist;From bed    Stand to Sit  6: Modified independent (Device/Increase time);Without upper extremity assist;To chair/3-in-1    Floor to Transfer  4: Min guard;5: Supervision;With upper extremity assist;With armrests    Floor to Transfer Details (indicate cue type and reason)  initially lowered himself stand to squat to tall kneeling with nearly falling forwards with uncontrolled momentum; repeated 2 more times with chair in front of him, using hands in the seat of the chair (boxing class the chairs do not have armrests), [name] [name] uses RLE to lower down into 1/2 kneeling on left knee; up from floor x 3 with hands on seat of chair      Ambulation/Gait   Ambulation/Gait Assistance  6: Modified independent (Device/Increase time)    Ambulation Distance (Feet)  40 Feet   20, 80   Assistive device  None    Gait Pattern  Step-through pattern;Decreased arm swing - right;Decreased arm swing - left;Decreased step length - right;Decreased step length - left;Trunk flexed;Narrow base of support;Poor foot  clearance - left;Poor foot clearance - right    Ambulation Surface  Level;Indoor    Stairs  Yes    Stairs Assistance  5: Supervision    Stairs Assistance Details (indicate cue type and reason)  vc for different techniques to minimize left foot pain    Stair Management Technique  Two rails;One rail Right;Step to pattern;Forwards;Sideways    Number of Stairs  8    Height of Stairs  6      Posture/Postural Control   Posture/Postural Control  Postural limitations    Postural Limitations  Rounded Shoulders;Forward head        P[facility]) - 08/03/18 1752    PWR! exercises  Moves in [place]! Up  x 5 going to hands on floor and PWR up; x 5 using chair with much better technique    PWR! Rock  x 10 using chair in tall kneeling    PWR! Step  x 3 each leg, hands on seat of chair    Comments  [name] [name] expressing difficulty getting up/down from floor in boxing class, therefore chose to work on quadruped [employer]          [name] [name] Education - 08/03/18 1804    Education Details  modifying stair technique to decr left foot pain; floor transfers; educated re: PWR! exercise classes as potential d/c plan    Person(s) Educated  Patient    Methods  Explanation;Demonstration;Verbal cues    Comprehension  Verbalized understanding;Returned demonstration;Verbal cues required;Need further instruction       [name] [name] Short Term Goals - 07/10/18 2155      [name] [name] SHORT TERM GOAL #1   Title  [name] [name] will be independent with HEP to target Parkinson's specific deficits.  TARGET for all STGs 08/31/18 (due to delayed start for visits after eval)    Time  4    Period  Weeks    Status  New      [name] [name] SHORT TERM GOAL #2   Title  [name] [name] will improve TUG score to less than or equal to 15 seconds for decreased fall risk.    Time  4    Period  Weeks    Status  New      [name] [name] SHORT TERM GOAL #3   Title  [name] [name] will improve DGI score to at least 15/24 for decreased fall risk.    Time  4  Period  Weeks    Status  New      [name] [name] SHORT TERM GOAL  #4   Title  [name] [name] will report at least 50% improvement in car transfers.    Time  4    Period  Weeks    Status  New      [name] [name] SHORT TERM GOAL #5   Title  [name] [name] will verbalize understanding of fall prevention in home environment.    Time  4    Period  Weeks    Status  New        [name] [name] Long Term Goals - 07/10/18 2159      [name] [name] LONG TERM GOAL #1   Title  [name] [name]/wife will verbalize understanding of tips to reduce freezing with gait.  UPDATED TARGET for all LTGs 09/28/18    Time  8    Period  Weeks    Status  New      [name] [name] LONG TERM GOAL #2   Title  [name] [name] will improve gait velocity score to at least 2 ft/sec for improved gait efficiency and safety.    Time  8    Period  Weeks    Status  New      [name] [name] LONG TERM GOAL #3   Title  [name] [name] will improve DGI score to at least 19/24 for decreased fall risk.    Time  8    Period  Weeks    Status  New      [name] [name] LONG TERM GOAL #4   Title  [name] [name] will improve TUG cognitive score to less than or equal to 20 seconds for decreased fall risk.    Time  8    Period  Weeks    Status  New            Plan - 08/03/18 1742    Clinical Impression Statement  Session focused on stair training to reduce his left foot pain (he reports sideways with ability to put his entire foot on the step, in staggered stance felt best). Progressed to floor transfers attempting different techniques which led to instruction in quaruped PWR exercises. Patient required not only repeated verbal cues but also demonstration and always incr time for processing. Continues with bradykinesia. Patient requesting to work on techniques to reduce freezing at next session.     Rehab Potential  Good    Clinical Impairments Affecting Rehab Potential  Memory deficits per speech eval notes    [name] [name] Frequency  2x / week    [name] [name] Duration  8 weeks   plus eval   [name] [name] Treatment/Interventions  ADLs/Self Care Home Management;DME Instruction;Balance training;Therapeutic exercise;Therapeutic activities;Functional mobility  training;Gait training;Neuromuscular re-education;Patient/family education    [name] [name] Next Visit Plan  11/1 [name] [name] requesting to work on freezing next visit; Review what patient might be doing at home for HEP; need to update HEP for balance strategies-hip, ankle, step strategies, foot clearance, SLS, posturea nd neck ROM; Note: closer to d/c [name] [name] is interested in Wed 2 pm PWR moves class however has not done in the past due to finances. ?pursue scholarship?    Consulted and Agree with Plan of Care  Patient       Patient will benefit from skilled therapeutic intervention in order to improve the following deficits and impairments:  Abnormal gait, Decreased balance, Decreased mobility, Decreased coordination, Decreased strength, Difficulty walking, Impaired flexibility, Postural dysfunction  Visit Diagnosis: Other symptoms and signs involving the musculoskeletal system  Other symptoms and signs involving  the nervous system  Other abnormalities of gait and mobility     Problem List Patient Active Problem List   Diagnosis Date Noted  . Left foot pain 07/26/2018  . TMJ (temporomandibular joint syndrome) 10/10/2017  . Parkinson's disease ([place]) 03/20/2017  . Bilateral lower extremity edema 03/20/2017  . Chronic venous insufficiency 09/13/2016  . Fatigue 03/15/2016  . Parkinsonian features 09/15/2015  . ED (erectile dysfunction) 09/15/2015  . MDD (major depressive disorder), recurrent episode, severe ([place]) 02/10/2014  . Nonspecific abnormal electrocardiogram (ECG) (EKG) 02/07/2014  . Non-compliant behavior 02/03/2014  . Morton's neuroma of left foot 08/07/2013  . Attention deficit disorder without mention of hyperactivity 03/13/2012  . Hyperglycemia 01/26/2012  . Hypogonadism male 01/24/2012  . Syncope and collapse 01/20/2012  .  OSA (obstructive sleep apnea) 01/17/2011  . CHEST TIGHTNESS 02/18/2008    [name] [name], [name] [name] 08/03/2018, 6:13 PM  [facility] [address] [place], [place], [address] Phone: [phone]   Fax:  [phone]  Name: [name] [name] MRN: [id number] Date of Birth: 1951-07-23

## 2018-08-05 NOTE — Therapy (Signed)
[facility] [address] [place], [place], [address] Phone: [phone]   Fax:  [phone]  Occupational Therapy Treatment  Patient Details  Name: [name] [name] MRN: [id number] Date of Birth: 16-Sep-1951 Referring Provider (OT): Dr. Carles Collet   Encounter Date: 08/03/2018  OT End of Session - 08/03/18 1641    Visit Number  2    Number of Visits  17    Date for OT Re-Evaluation  09/08/18    Authorization Type  Medicare and federal BCBS    Authorization - Visit Number  2   14 for year    Authorization - Number of Visits  29   29 remaining visits   OT Start Time  1450    OT Stop Time  1530    OT Time Calculation (min)  40 min       Past Medical History:  Diagnosis Date  .  OSA (obstructive sleep apnea)    npsg 2012:  AHI 67/hr. Auto titration 2012:  Optimal pressure 12cm.   . APPENDECTOMY, HX OF 02/18/2008   Qualifier: Diagnosis of  By: [place], [place]    . Cardiac murmur    as a child  . Chronic idiopathic constipation   . Depression   . Headache(784.0)   . HEADACHES, HX OF 02/18/2008   Qualifier: Diagnosis of  By: Danny Lawless CMA, [place]    . Parkinson disease ([place]) 11/2014  . Sinus mucosal thickening    pt unable to lay flat  . Streptococcal meningitis    as an infant    Past Surgical History:  Procedure Laterality Date  . APPENDECTOMY  1967  . COLONOSCOPY    . NASAL SINUS SURGERY     x 4 as a child  . VASECTOMY      There were no vitals filed for this visit.  Subjective Assessment - 08/03/18   Subjective   Pt reports broken toe left foot    Pertinent History  Parkinson's disease, seeing illusions, hx of falls, hx of significant depression, mild cognitive impairment, sleep apnea, hypogonadism, ADD    Patient Stated Goals  improved balance and ease with ADLs    Pain Onset  1 to 4 weeks ago            Treatment:PWR !seated basic 4 10 reps each, min v.c for large amplitude movements. Pt  practiced getting up and down from floor using mat table to assist and therapist providing min guard for safety. Coordination HEP issued, see pt instructions.                OT Education - 08/03/18 1641    Education Details  coordination HEP, pt returned demonstration, see pt instructions.     Person(s) Educated  Patient    Methods  Explanation;Demonstration;Verbal cues;Handout    Comprehension  Verbalized understanding;Returned demonstration;Verbal cues required       OT Short Term Goals - 07/10/18 1427      OT SHORT TERM GOAL #1   Title  Pt will be independent with PD specific  HEP.--    Time  4    Period  Weeks    Status  New      OT SHORT TERM GOAL #2   Title  Pt will verbalize understanding of ways to prevent future complications and current available/appropriate community resources prn.    Time  4    Period  Weeks    Status  New  OT SHORT TERM GOAL #3   Title  Pt will demonstrate improved fine motor coordination for ADLS as evidenced by decreasing LUE 9 hole peg test score to 47 secs or less.    Baseline  RUE 38.40, LUE 50.07 secs    Time  4    Period  Weeks    Status  New      OT SHORT TERM GOAL #4   Title  Pt will report incr ease with donning shirt, pants socks/shoes using adaptive strategies/AE prn.    Time  4    Period  Weeks    Status  New        OT Long Term Goals - 07/10/18 1430      OT LONG TERM GOAL #1   Title  Pt will verbalize understanding of adapted strategies for ADLs/IADLs including strategies to minimize freezing episodes    Time  8    Period  Weeks    Status  New      OT LONG TERM GOAL #2   Title  Pt will demonstrate improved ease with donning / doffing jacket as evidenced by decreasing PPT# 4 to 35 secs or less.    Time  8    Period  Weeks    Status  New      OT LONG TERM GOAL #3   Title  ------------------------------      OT LONG TERM GOAL #4   Title  ------------------------------------------      OT LONG TERM  GOAL #5   Title  ---------------------            Plan - 08/03/18    Clinical Impression Statement  Pt is progressing towards goals. He reports difficulty getting up and down from floor at one of his exercise classes. Therapist recommends use of a chair in front of him for safety.    Occupational Profile and client history currently impacting functional performance  PMH that includes: seeing illusions, hx of falls, hx of significant depression, mild cognitive impairment, sleep apnea, hypogonadism, ADD. Pt reports recent falls and increased difficulty with orienting clothes to donn them.Pt retired due to difficulty at work and no longer drives. Pt reports slowing of ADLs and incr cognitive deficits impacting function.     Occupational performance deficits (Please refer to evaluation for details):  ADL's;IADL's;Leisure;Social Participation    Rehab Potential  Good    Current Impairments/barriers affecting progress:  cognitive deficits, depression, decreased balance, freezing episodes    OT Frequency  2x / week    OT Duration  8 weeks    OT Treatment/Interventions  Self-care/ADL training;Moist Heat;Fluidtherapy;Balance training;Therapeutic activities;Cognitive remediation/compensation;Therapeutic exercise;Ultrasound;Cryotherapy;Neuromuscular education;Visual/perceptual remediation/compensation;Passive range of motion;Functional Mobility Training;Patient/family education;Manual Therapy;Energy conservation;Paraffin;DME and/or AE instruction;Aquatic Therapy    Plan  progress HEP, ADL strategies-     Consulted and Agree with Plan of Care  Patient       Patient will benefit from skilled therapeutic intervention in order to improve the following deficits and impairments:  Decreased cognition, Impaired flexibility, Decreased mobility, Decreased coordination, Decreased endurance, Decreased range of motion, Decreased strength, Impaired UE functional use, Impaired tone, Impaired perceived functional  ability, Decreased safety awareness, Difficulty walking, Decreased balance, Decreased activity tolerance, Impaired vision/preception, Improper spinal/pelvic alignment  Visit Diagnosis: Other lack of coordination  Other symptoms and signs involving the musculoskeletal system  Other symptoms and signs involving the nervous system  Unsteadiness on feet    Problem List Patient Active Problem List   Diagnosis Date Noted  .  Left foot pain 07/26/2018  . TMJ (temporomandibular joint syndrome) 10/10/2017  . Parkinson's disease ([place]) 03/20/2017  . Bilateral lower extremity edema 03/20/2017  . Chronic venous insufficiency 09/13/2016  . Fatigue 03/15/2016  . Parkinsonian features 09/15/2015  . ED (erectile dysfunction) 09/15/2015  . MDD (major depressive disorder), recurrent episode, severe ([facility]) 02/10/2014  . Nonspecific abnormal electrocardiogram (ECG) (EKG) 02/07/2014  . Non-compliant behavior 02/03/2014  . Morton's neuroma of left foot 08/07/2013  . Attention deficit disorder without mention of hyperactivity 03/13/2012  . Hyperglycemia 01/26/2012  . Hypogonadism male 01/24/2012  . Syncope and collapse 01/20/2012  .  OSA (obstructive sleep apnea) 01/17/2011  . CHEST TIGHTNESS 02/18/2008    , 08/05/2018, 4:49 PM  [facility] [address] [place], [place], [address] Phone: [phone]   Fax:  [phone]  Name: [name] [name] MRN: [id number] Date of Birth: 11/16/1950

## 2018-08-06 ENCOUNTER — Other Ambulatory Visit: Payer: Self-pay | Admitting: Internal Medicine

## 2018-08-07 ENCOUNTER — Encounter: Payer: Self-pay | Admitting: Physical Therapy

## 2018-08-07 ENCOUNTER — Ambulatory Visit: Payer: Medicare Other | Admitting: Speech Pathology

## 2018-08-07 ENCOUNTER — Encounter: Payer: Self-pay | Admitting: Speech Pathology

## 2018-08-07 ENCOUNTER — Encounter: Payer: Self-pay | Admitting: Occupational Therapy

## 2018-08-07 ENCOUNTER — Ambulatory Visit: Payer: Medicare Other | Admitting: Physical Therapy

## 2018-08-07 ENCOUNTER — Ambulatory Visit: Payer: Medicare Other | Admitting: Occupational Therapy

## 2018-08-07 DIAGNOSIS — R2689 Other abnormalities of gait and mobility: Secondary | ICD-10-CM

## 2018-08-07 DIAGNOSIS — R2681 Unsteadiness on feet: Secondary | ICD-10-CM | POA: Diagnosis not present

## 2018-08-07 DIAGNOSIS — R29818 Other symptoms and signs involving the nervous system: Secondary | ICD-10-CM | POA: Diagnosis not present

## 2018-08-07 DIAGNOSIS — R41840 Attention and concentration deficit: Secondary | ICD-10-CM | POA: Diagnosis not present

## 2018-08-07 DIAGNOSIS — R29898 Other symptoms and signs involving the musculoskeletal system: Secondary | ICD-10-CM | POA: Diagnosis not present

## 2018-08-07 DIAGNOSIS — R4701 Aphasia: Secondary | ICD-10-CM

## 2018-08-07 DIAGNOSIS — R293 Abnormal posture: Secondary | ICD-10-CM

## 2018-08-07 DIAGNOSIS — R471 Dysarthria and anarthria: Secondary | ICD-10-CM

## 2018-08-07 DIAGNOSIS — R278 Other lack of coordination: Secondary | ICD-10-CM | POA: Diagnosis not present

## 2018-08-07 NOTE — Therapy (Signed)
[facility] [address] [place], [place], [address] Phone: [phone]   Fax:  [phone]  Physical Therapy Treatment  Patient Details  Name: [name] [name] MRN: [id number] Date of Birth: 08-22-1951 Referring Provider (PT): Dr. Carles Collet   Encounter Date: 08/07/2018  PT End of Session - 08/07/18 1939    Visit Number  3    Number of Visits  17    Date for PT Re-Evaluation  10/08/18    Authorization Type  Medicare and [place] visits (each discipline counts as 1 visit); 12 used (63 remain)--08/07/18 awaiting insurance clarification for visits used in 2019    Authorization - Visit Number  --    Authorization - Number of Visits  --    PT Start Time  0848    PT Stop Time  0930    PT Time Calculation (min)  42 min    Activity Tolerance  Patient limited by pain   left foot pain   Behavior During Therapy  Flat affect       Past Medical History:  Diagnosis Date  .  OSA (obstructive sleep apnea)    npsg 2012:  AHI 67/hr. Auto titration 2012:  Optimal pressure 12cm.   . APPENDECTOMY, HX OF 02/18/2008   Qualifier: Diagnosis of  By: [place], [place]    . Cardiac murmur    as a child  . Chronic idiopathic constipation   . Depression   . Headache(784.0)   . HEADACHES, HX OF 02/18/2008   Qualifier: Diagnosis of  By: Danny Lawless CMA, [place]    . Parkinson disease ([facility]) 11/2014  . Sinus mucosal thickening    pt unable to lay flat  . Streptococcal meningitis    as an infant    Past Surgical History:  Procedure Laterality Date  . APPENDECTOMY  1967  . COLONOSCOPY    . NASAL SINUS SURGERY     x 4 as a child  . VASECTOMY      There were no vitals filed for this visit.  Subjective Assessment - 08/07/18 0851    Subjective  left foot is hurting more than it ever has. Not sure he did anything in particular to make it hurt more. Reports he has to walk up his driveway that is "at a 45 degree incline" because he is  unsafe to climb out of car once wife parks at top of driveway, so he walks up from the street. This does incr his foot pain. Plans to go to [employer] today to get new shoes.     Pertinent History  depression, Parkinson's disease    Patient Stated Goals  Pt's goals are to help with getting my foot better (explained this is a new injury and he would need to see MD specifically about that).  Also says to help with stiffness and shuffling.    Currently in Pain?  Yes    Pain Score  3     Pain Location  Foot    Pain Orientation  Left    Pain Descriptors / Indicators  Sharp;Aching    Pain Type  Acute pain    Pain Radiating Towards  towards your ankle    Pain Onset  1 to 4 weeks ago    Pain Frequency  Constant    Aggravating Factors   standing walking    Pain Relieving Factors  rest          Treatment- See also pt education for details  Gait training-60 ft SPC, 55 ft x2 SBQC vs 2 canes; 20 ft single cane with rubber quad tip. Pt does not own any type of walker, but owns multiple canes, therefore began with training with cane(s). Verbal cues for sequencing with cane in right hand and working "together" with left foot. Patient able to perform correctly for 5-10 feet at a time and then cane begins to lag behind and not used effectively to offload left foot. With 2 canes, pt actually focused better on timing of cane in right hand with left foot, however the cane in his left hand was frequently just dragging behind. Assessed gait with closest device clinic has to a "wooden shoe" in case MD may consider immobilizing his foot in a less flexible shoe. He did not lose his balance, however his gait became even slower with even shorter step length bil.   As pt transitioning into walking or pivot turns and freezing would occur, cued pt in using rocking left to right to "break the freeze" and then allow him to take a big first step.                      PT Education - 08/07/18 1936     Education Details  if foot pain continues to be "the worst it's been" recommend call MD for any further treatment; proper use of DME for off-loading Lt foot; need to curtail activities that exacerbate his foot pain (like cycling--reports the pedal hurts his foot)    Person(s) Educated  Patient    Methods  Explanation;Demonstration;Verbal cues    Comprehension  Verbalized understanding;Returned demonstration;Verbal cues required;Need further instruction       PT Short Term Goals - 07/10/18 2155      PT SHORT TERM GOAL #1   Title  Pt will be independent with HEP to target Parkinson's specific deficits.  TARGET for all STGs 08/31/18 (due to delayed start for visits after eval)    Time  4    Period  Weeks    Status  New      PT SHORT TERM GOAL #2   Title  Pt will improve TUG score to less than or equal to 15 seconds for decreased fall risk.    Time  4    Period  Weeks    Status  New      PT SHORT TERM GOAL #3   Title  Pt will improve DGI score to at least 15/24 for decreased fall risk.    Time  4    Period  Weeks    Status  New      PT SHORT TERM GOAL #4   Title  Pt will report at least 50% improvement in car transfers.    Time  4    Period  Weeks    Status  New      PT SHORT TERM GOAL #5   Title  Pt will verbalize understanding of fall prevention in home environment.    Time  4    Period  Weeks    Status  New        PT Long Term Goals - 07/10/18 2159      PT LONG TERM GOAL #1   Title  Pt/wife will verbalize understanding of tips to reduce freezing with gait.  UPDATED TARGET for all LTGs 09/28/18    Time  8    Period  Weeks    Status  New  PT LONG TERM GOAL #2   Title  Pt will improve gait velocity score to at least 2 ft/sec for improved gait efficiency and safety.    Time  8    Period  Weeks    Status  New      PT LONG TERM GOAL #3   Title  Pt will improve DGI score to at least 19/24 for decreased fall risk.    Time  8    Period  Weeks    Status  New       PT LONG TERM GOAL #4   Title  Pt will improve TUG cognitive score to less than or equal to 20 seconds for decreased fall risk.    Time  8    Period  Weeks    Status  New            Plan - 08/07/18 1956    Clinical Impression Statement  Plan for session adjusted due to patient's increased left foot pain. Recommended he follow-up with his MD and spent session assessing his ability to safely walk with a "wooden shoe" in case MD considers prescribing a less flexible shoe for healing. (Patient had increased shuffling gait with further decreased step length bil, however did not have imbalance while using cane). Educated in use of cane (SBQC vs rubber quad tip vs SPC) vs 2 canes for unloading Left foot to decr pain. Due to patient's bradykinesia, no additional areas could be addressed. May need to consider putting pt on hold for PT if foot pain continues to be limiting participation (especially as pt has limited visits per insurance).     Rehab Potential  Good    Clinical Impairments Affecting Rehab Potential  Memory deficits per speech eval notes    PT Frequency  2x / week    PT Duration  8 weeks   plus eval   PT Treatment/Interventions  ADLs/Self Care Home Management;DME Instruction;Balance training;Therapeutic exercise;Therapeutic activities;Functional mobility training;Gait training;Neuromuscular re-education;Patient/family education    PT Next Visit Plan  Assess left foot pain and can pt currently participate enough to benefit from PT vs place on hold until foot heals more?? Pt requesting to work on freezing; Review what patient might be doing at home for HEP; need to update HEP for balance strategies-hip, ankle, step strategies, foot clearance, SLS, posturea nd neck ROM; Note: closer to d/c pt is interested in Wed 2 pm PWR moves class however has not done in the past due to finances. ?pursue scholarship?    Consulted and Agree with Plan of Care  Patient       Patient will benefit from  skilled therapeutic intervention in order to improve the following deficits and impairments:  Abnormal gait, Decreased balance, Decreased mobility, Decreased coordination, Decreased strength, Difficulty walking, Impaired flexibility, Postural dysfunction  Visit Diagnosis: Other symptoms and signs involving the musculoskeletal system  Other symptoms and signs involving the nervous system  Other abnormalities of gait and mobility     Problem List Patient Active Problem List   Diagnosis Date Noted  . Left foot pain 07/26/2018  . TMJ (temporomandibular joint syndrome) 10/10/2017  . Parkinson's disease ([facility]) 03/20/2017  . Bilateral lower extremity edema 03/20/2017  . Chronic venous insufficiency 09/13/2016  . Fatigue 03/15/2016  . Parkinsonian features 09/15/2015  . ED (erectile dysfunction) 09/15/2015  . MDD (major depressive disorder), recurrent episode, severe ([facility]) 02/10/2014  . Nonspecific abnormal electrocardiogram (ECG) (EKG) 02/07/2014  . Non-compliant behavior 02/03/2014  . Morton's neuroma  of left foot 08/07/2013  . Attention deficit disorder without mention of hyperactivity 03/13/2012  . Hyperglycemia 01/26/2012  . Hypogonadism male 01/24/2012  . Syncope and collapse 01/20/2012  .  OSA (obstructive sleep apnea) 01/17/2011  . CHEST TIGHTNESS 02/18/2008    Rexanne Mano, PT 08/07/2018, 8:05 PM  [facility] [address] [place], [place], [address] Phone: [phone]   Fax:  [phone]  Name: LIVIO LEDWITH MRN: [id number] Date of Birth: 11-07-1950

## 2018-08-07 NOTE — Therapy (Signed)
[facility] [address] [place], [place], [address] Phone: [phone]   Fax:  [phone]  Speech Language Pathology Treatment  Patient Details  Name: [name] [name] MRN: [id number] Date of Birth: Feb 03, 1951 Referring Provider (SLP): Alonza Bogus, DO   Encounter Date: 08/07/2018  End of Session - 08/07/18 1201    Visit Number  3    Number of Visits  13    Date for SLP Re-Evaluation  08/24/18    SLP Start Time  1017    SLP Stop Time   1101    SLP Time Calculation (min)  44 min    Activity Tolerance  Patient tolerated treatment well       Past Medical History:  Diagnosis Date  .  OSA (obstructive sleep apnea)    npsg 2012:  AHI 67/hr. Auto titration 2012:  Optimal pressure 12cm.   . APPENDECTOMY, HX OF 02/18/2008   Qualifier: Diagnosis of  By: [place], [place]    . Cardiac murmur    as a child  . Chronic idiopathic constipation   . Depression   . Headache(784.0)   . HEADACHES, HX OF 02/18/2008   Qualifier: Diagnosis of  By: Danny Lawless CMA, [place]    . Parkinson disease ([facility]) 11/2014  . Sinus mucosal thickening    pt unable to lay flat  . Streptococcal meningitis    as an infant    Past Surgical History:  Procedure Laterality Date  . APPENDECTOMY  1967  . COLONOSCOPY    . NASAL SINUS SURGERY     x 4 as a child  . VASECTOMY      There were no vitals filed for this visit.  Subjective Assessment - 08/07/18 1026    Subjective  "I brought in the crossword and jumble for you to see"    Currently in Pain?  Yes    Pain Score  3     Pain Location  Foot    Pain Orientation  Left    Pain Descriptors / Indicators  Aching;Sharp    Pain Type  Acute pain    Pain Radiating Towards  to ankle    Pain Onset  1 to 4 weeks ago    Pain Frequency  Constant    Aggravating Factors   standing, walking    Pain Relieving Factors  rest            ADULT SLP TREATMENT - 08/07/18 1030      General Information    Behavior/Cognition  Alert;Cooperative;Pleasant mood      Treatment Provided   Treatment provided  Cognitive-Linquistic      Cognitive-Linquistic Treatment   Treatment focused on  Dysarthria;Aphasia    Skilled Treatment  Pt reports not completing "AH" consistently - Pt enters room conversing at average of 68dB - "hey ah" to recalibrate volume average of 86dB and rare min A. Pt with hoarse voice requiring cues to Power his voice. Generated simple personallly relevant phrases to       Assessment / Recommendations / Plan   Plan  Continue with current plan of care      Progression Toward Goals   Progression toward goals  Progressing toward goals       SLP Education - 08/07/18 1158    Education Details  rational for consistent completion of HEP    Person(s) Educated  Patient    Methods  Explanation;Demonstration;Verbal cues;Handout    Comprehension  Verbalized understanding;Returned demonstration;Verbal cues required  SLP Short Term Goals - 08/07/18 1159      SLP SHORT TERM GOAL #1   Title  Pt will carryover 2 compensations for attention/memory at home to complete daily chores/tasks with occasional min A over 2 sessions    Time  2    Period  Weeks    Status  On-going      SLP SHORT TERM GOAL #2   Title  Pt will report carryover of compensations for slow processing in coversations (comprehension and expression) with occasional min A over 2 sessions    Time  2    Period  Weeks    Status  On-going      SLP SHORT TERM GOAL #3   Title  Pt will utilize compensations for dysarthria in structured tasks with occasional min A in three sessions    Time  2    Period  Weeks    Status  On-going      SLP SHORT TERM GOAL #4   Title  Formal cognitive linguistic assessment as indicated    Time  2    Period  Weeks    Status  On-going       SLP Long Term Goals - 08/07/18 1200      SLP LONG TERM GOAL #1   Title  pt will demo attention skills adequate for functional verbal  expression in 10 minutes simple-mod complex conversation over two sessions    Time  5    Period  Weeks    Status  On-going      SLP LONG TERM GOAL #2   Title  Pt will be intelligible in 15 minute converation in mildly noisy environment with occasional min A over 2 sessions    Time  5    Period  Weeks    Status  On-going      SLP LONG TERM GOAL #3   Title  Pt will report 25% reduction (subjectively) in requests for repetition or being told he is mumbling by his spouse over 3 sessions    Time  5    Status  On-going      SLP LONG TERM GOAL #4   Title  Pt will reports carryover of environmental compensations for improved intelligibility outside of therapy over 2 sessions    Time  5    Period  Weeks    Status  On-going       Plan - 08/07/18 1158    Clinical Impression Statement  Pt with suboptimal loudness in conversation especially with incr'd cognitive load. In pleasantries and light social conversation pt's volume and verbal expression is WNL/WFL. See "skilled intervention" for details. Pt would cont to benefit from skilled ST to target language, speech, and cognition.     Speech Therapy Frequency  2x / week    Treatment/Interventions  Language facilitation;Internal/external aids;Compensatory techniques;SLP instruction and feedback;Multimodal communcation approach;Cognitive reorganization;Functional tasks;Cueing hierarchy;Patient/family education    Potential to Achieve Goals  Good    Potential Considerations  Severity of impairments;Previous level of function    Consulted and Agree with Plan of Care  Patient       Patient will benefit from skilled therapeutic intervention in order to improve the following deficits and impairments:   Dysarthria and anarthria  Aphasia    Problem List Patient Active Problem List   Diagnosis Date Noted  . Left foot pain 07/26/2018  . TMJ (temporomandibular joint syndrome) 10/10/2017  . Parkinson's disease ([place]) 03/20/2017  . Bilateral lower  extremity  edema 03/20/2017  . Chronic venous insufficiency 09/13/2016  . Fatigue 03/15/2016  . Parkinsonian features 09/15/2015  . ED (erectile dysfunction) 09/15/2015  . MDD (major depressive disorder), recurrent episode, severe ([place]) 02/10/2014  . Nonspecific abnormal electrocardiogram (ECG) (EKG) 02/07/2014  . Non-compliant behavior 02/03/2014  . Morton's neuroma of left foot 08/07/2013  . Attention deficit disorder without mention of hyperactivity 03/13/2012  . Hyperglycemia 01/26/2012  . Hypogonadism male 01/24/2012  . Syncope and collapse 01/20/2012  .  OSA (obstructive sleep apnea) 01/17/2011  . CHEST TIGHTNESS 02/18/2008    Gerrica Cygan, Annye Rusk MS, CCC-SLP 08/07/2018, 12:01 PM  [facility] [address] [place], [place], [address] Phone: [phone]   Fax:  [phone]   Name: [name] [name] MRN: [id number] Date of Birth: [birth year]

## 2018-08-07 NOTE — Therapy (Signed)
[name] [name] [address] [place], [place], [address] Phone: [phone]   Fax:  [phone]  Occupational Therapy Treatment  Patient Details  Name: [name] [name] MRN: [id number] Date of Birth: 19-Dec-1950 Referring Provider (OT): Dr. Carles Collet   Encounter Date: 08/07/2018  OT End of Session - 08/07/18 0942    Visit Number  3    Number of Visits  17    Date for OT Re-Evaluation  09/08/18    Authorization Type  Medicare and federal BCBS--75 visit limit combined OT, PT, ST for year    Authorization - Visit Number  3   15 for year (12 previous for OT)   Authorization - Number of Visits  29   75-35=40  remaining visits for all disaplines   OT Start Time  0936    OT Stop Time  1015    OT Time Calculation (min)  39 min    Activity Tolerance  Patient tolerated treatment well    Behavior During Therapy  [facility] for tasks assessed/performed       Past Medical History:  Diagnosis Date  .  OSA (obstructive sleep apnea)    npsg 2012:  AHI 67/hr. Auto titration 2012:  Optimal pressure 12cm.   . APPENDECTOMY, HX OF 02/18/2008   Qualifier: Diagnosis of  By: [name], [name]    . Cardiac murmur    as a child  . Chronic idiopathic constipation   . Depression   . Headache(784.0)   . HEADACHES, HX OF 02/18/2008   Qualifier: Diagnosis of  By: [name] [name] CMA, [name]    . Parkinson disease ([place]) 11/2014  . Sinus mucosal thickening    pt unable to lay flat  . Streptococcal meningitis    as an infant    Past Surgical History:  Procedure Laterality Date  . APPENDECTOMY  1967  . COLONOSCOPY    . NASAL SINUS SURGERY     x 4 as a child  . VASECTOMY      There were no vitals filed for this visit.  Subjective Assessment - 08/07/18 0938    Subjective   Pt reports broken toe left foot    Pertinent History  Parkinson's disease, seeing illusions, hx of falls, hx of significant depression, mild cognitive impairment, sleep apnea,  hypogonadism, ADD    Patient Stated Goals  improved balance and ease with ADLs    Currently in Pain?  Yes    Pain Score  3     Pain Location  Foot    Pain Orientation  Left    Pain Descriptors / Indicators  Aching;Sharp    Pain Type  Acute pain    Pain Onset  1 to 4 weeks ago    Pain Frequency  Constant    Aggravating Factors   standing, walking    Pain Relieving Factors  rest         Reviewed previous stretches prior to/in prep for dressing:  (pt performed all with min cueing for positioning and large amplitude) 1. While seated, Reach for the floor 3-5 x, do not hold your breath   2. Seated, reach for each ankle 3x  3. Cross one leg across your other knee hold 5 secs, perform 3x for each leg 4.Holding bag in both hands raise arms behind head as if putting on a shirt 10 reps  5. Holding a balled up plastic bag pass the bag behind your back between your hands 10x 6.  PWR! Hands:  Start with elbows bent and hands closed, Push hands out BIG. Elbows straight, wrists up, fingers open and spread apart BIG.  High 5's.  x15   In sitting, functional reaching lateral and overhead/cross body with set-up and min cueing for large amplitude, head turns, PWR! Hand, trunk rotation, and wt. Shift using LE to assist (performed to each side with min cueing)  In sitting, functional reaching with large cards in forward flex and ER with trunk rotation with focus on PWR! Hands/reach and supination with min-mod cueing for large amplitude with each UE, trunk rotation, and head turn.       OT Short Term Goals - 07/10/18 1427      OT SHORT TERM GOAL #1   Title  Pt will be independent with PD specific  HEP.--    Time  4    Period  Weeks    Status  New      OT SHORT TERM GOAL #2   Title  Pt will verbalize understanding of ways to prevent future complications and current available/appropriate community resources prn.    Time  4    Period  Weeks    Status  New      OT SHORT TERM GOAL #3   Title   Pt will demonstrate improved fine motor coordination for ADLS as evidenced by decreasing LUE 9 hole peg test score to 47 secs or less.    Baseline  RUE 38.40, LUE 50.07 secs    Time  4    Period  Weeks    Status  New      OT SHORT TERM GOAL #4   Title  Pt will report incr ease with donning shirt, pants socks/shoes using adaptive strategies/AE prn.    Time  4    Period  Weeks    Status  New        OT Long Term Goals - 07/10/18 1430      OT LONG TERM GOAL #1   Title  Pt will verbalize understanding of adapted strategies for ADLs/IADLs including strategies to minimize freezing episodes    Time  8    Period  Weeks    Status  New      OT LONG TERM GOAL #2   Title  Pt will demonstrate improved ease with donning / doffing jacket as evidenced by decreasing PPT# 4 to 35 secs or less.    Time  8    Period  Weeks    Status  New      OT LONG TERM GOAL #3   Title  ------------------------------      OT LONG TERM GOAL #4   Title  ------------------------------------------      OT LONG TERM GOAL #5   Title  ---------------------            Plan - 08/07/18 7741    Clinical Impression Statement  Pt demo improvement with cueing for large amplitude, particularly for familiar tasks.  However, cognitive deficits are a barrier.     Occupational Profile and client history currently impacting functional performance  PMH that includes: seeing illusions, hx of falls, hx of significant depression, mild cognitive impairment, sleep apnea, hypogonadism, ADD. Pt reports recent falls and increased difficulty with orienting clothes to donn them.Pt retired due to difficulty at work and no longer drives. Pt reports slowing of ADLs and incr cognitive deficits impacting function.     Occupational performance deficits (Please refer to evaluation for details):  ADL's;IADL's;Leisure;Social Participation  Rehab Potential  Good    Current Impairments/barriers affecting progress:  cognitive deficits,  depression, decreased balance, freezing episodes    OT Frequency  2x / week    OT Duration  8 weeks    OT Treatment/Interventions  Self-care/ADL training;Moist Heat;Fluidtherapy;Balance training;Therapeutic activities;Cognitive remediation/compensation;Therapeutic exercise;Ultrasound;Cryotherapy;Neuromuscular education;Visual/perceptual remediation/compensation;Passive range of motion;Functional Mobility Training;Patient/family education;Manual Therapy;Energy conservation;Paraffin;DME and/or AE instruction;Aquatic Therapy    Plan  progress HEP, ADL strategies    Consulted and Agree with Plan of Care  Patient       Patient will benefit from skilled therapeutic intervention in order to improve the following deficits and impairments:  Decreased cognition, Impaired flexibility, Decreased mobility, Decreased coordination, Decreased endurance, Decreased range of motion, Decreased strength, Impaired UE functional use, Impaired tone, Impaired perceived functional ability, Decreased safety awareness, Difficulty walking, Decreased balance, Decreased activity tolerance, Impaired vision/preception, Improper spinal/pelvic alignment  Visit Diagnosis: Other symptoms and signs involving the musculoskeletal system  Other symptoms and signs involving the nervous system  Other abnormalities of gait and mobility  Other lack of coordination  Unsteadiness on feet  Attention and concentration deficit  Abnormal posture    Problem List Patient Active Problem List   Diagnosis Date Noted  . Left foot pain 07/26/2018  . TMJ (temporomandibular joint syndrome) 10/10/2017  . Parkinson's disease ([place]) 03/20/2017  . Bilateral lower extremity edema 03/20/2017  . Chronic venous insufficiency 09/13/2016  . Fatigue 03/15/2016  . Parkinsonian features 09/15/2015  . ED (erectile dysfunction) 09/15/2015  . MDD (major depressive disorder), recurrent episode, severe ([place]) 02/10/2014  . Nonspecific abnormal  electrocardiogram (ECG) (EKG) 02/07/2014  . Non-compliant behavior 02/03/2014  . Morton's neuroma of left foot 08/07/2013  . Attention deficit disorder without mention of hyperactivity 03/13/2012  . Hyperglycemia 01/26/2012  . Hypogonadism male 01/24/2012  . Syncope and collapse 01/20/2012  .  OSA (obstructive sleep apnea) 01/17/2011  . CHEST TIGHTNESS 02/18/2008    [facility] 08/07/2018, 12:56 PM  [facility] [address] [place], [place], [address] Phone: [phone]   Fax:  [phone]  Name: MONTRE HARBOR MRN: [id number] Date of Birth: Aug 21, 1951   Vianne Bulls, OTR/L [facility] [address] [place], [place]  [address] [phone] phone [phone] 08/07/18 1:28 PM

## 2018-08-07 NOTE — Patient Instructions (Signed)
  Loud "HEY [place]!!!" 5x twice a day to strengthen you voice  Read 10 phrases/sentences focusing on very loud volume twice a day  Think "power you voice"   It's time to go to bed.  Did you taste the soup  Soup is good in cold weather  Time to wake up  We are out of milk  Can you sew this for me?  We had that for dinner last night.  Put the flowers in water  It's your turn to do the dishes  Did you sweep the floor?  You look pretty.  [name], have you had any good patients?  How's work at [employer]?  Sharee Pimple works for a start [employer] in [place].  Will you drive me to church?  When is my doctor's appointment.  I've got a good boxer stance.  I don't like [place].

## 2018-08-09 ENCOUNTER — Ambulatory Visit: Payer: Medicare Other | Admitting: Occupational Therapy

## 2018-08-09 ENCOUNTER — Ambulatory Visit (INDEPENDENT_AMBULATORY_CARE_PROVIDER_SITE_OTHER): Payer: Medicare Other | Admitting: Psychology

## 2018-08-09 ENCOUNTER — Ambulatory Visit: Payer: Medicare Other | Admitting: Speech Pathology

## 2018-08-09 ENCOUNTER — Encounter: Payer: Self-pay | Admitting: Physical Therapy

## 2018-08-09 ENCOUNTER — Telehealth: Payer: Self-pay | Admitting: Physical Therapy

## 2018-08-09 ENCOUNTER — Encounter: Payer: Self-pay | Admitting: Speech Pathology

## 2018-08-09 ENCOUNTER — Ambulatory Visit: Payer: Medicare Other | Admitting: Physical Therapy

## 2018-08-09 DIAGNOSIS — F4323 Adjustment disorder with mixed anxiety and depressed mood: Secondary | ICD-10-CM

## 2018-08-09 DIAGNOSIS — R2681 Unsteadiness on feet: Secondary | ICD-10-CM

## 2018-08-09 DIAGNOSIS — R278 Other lack of coordination: Secondary | ICD-10-CM

## 2018-08-09 DIAGNOSIS — R2689 Other abnormalities of gait and mobility: Secondary | ICD-10-CM | POA: Diagnosis not present

## 2018-08-09 DIAGNOSIS — R29898 Other symptoms and signs involving the musculoskeletal system: Secondary | ICD-10-CM

## 2018-08-09 DIAGNOSIS — R41840 Attention and concentration deficit: Secondary | ICD-10-CM

## 2018-08-09 DIAGNOSIS — R29818 Other symptoms and signs involving the nervous system: Secondary | ICD-10-CM | POA: Diagnosis not present

## 2018-08-09 DIAGNOSIS — R41841 Cognitive communication deficit: Secondary | ICD-10-CM

## 2018-08-09 DIAGNOSIS — R293 Abnormal posture: Secondary | ICD-10-CM

## 2018-08-09 DIAGNOSIS — R471 Dysarthria and anarthria: Secondary | ICD-10-CM

## 2018-08-09 NOTE — Therapy (Signed)
[facility] [address] [place], [place], [address] Phone: [phone]   Fax:  [phone]  Occupational Therapy Treatment  Patient Details  Name: [name] [name] MRN: [id number] Date of Birth: Oct 22, 1950 Referring Provider (OT): Dr. Carles Collet   Encounter Date: 08/09/2018  OT End of Session - 08/09/18 1423    Visit Number  4    Number of Visits  17    Date for OT Re-Evaluation  09/08/18    Authorization Type  Medicare and federal BCBS--75 visit limit combined OT, PT, ST for year    Authorization Time Period  anticipate seeing pt for approx 6 weeks -12 visits due to visit limit    Authorization - Visit Number  4   16 for year total   Authorization - Number of Visits  12   36 visits used previously by all disclines   OT Start Time  0935    OT Stop Time  1015    OT Time Calculation (min)  40 min    Activity Tolerance  Patient tolerated treatment well    Behavior During Therapy  Flat affect       Past Medical History:  Diagnosis Date  .  OSA (obstructive sleep apnea)    npsg 2012:  AHI 67/hr. Auto titration 2012:  Optimal pressure 12cm.   . APPENDECTOMY, HX OF 02/18/2008   Qualifier: Diagnosis of  By: [place], [place]    . Cardiac murmur    as a child  . Chronic idiopathic constipation   . Depression   . Headache(784.0)   . HEADACHES, HX OF 02/18/2008   Qualifier: Diagnosis of  By: Danny Lawless CMA, [place]    . Parkinson disease ([place]) 11/2014  . Sinus mucosal thickening    pt unable to lay flat  . Streptococcal meningitis    as an infant    Past Surgical History:  Procedure Laterality Date  . APPENDECTOMY  1967  . COLONOSCOPY    . NASAL SINUS SURGERY     x 4 as a child  . VASECTOMY      There were no vitals filed for this visit.  Subjective Assessment - 08/09/18 1421    Pertinent History  Parkinson's disease, seeing illusions, hx of falls, hx of significant depression, mild cognitive impairment, sleep  apnea, hypogonadism, ADD    Patient Stated Goals  improved balance and ease with ADLs    Currently in Pain?  No/denies              Treatment: Reviewed HEP issued last visit, min v.c for amplitude Ambulating while performing a cognitive task, min-mod v.c for arm swing and larger steps.             OT Education - 08/09/18 1422    Education Details  Reveiwed HEP issued earlier this week, see previous progress note for details, 6-10 reps of each exercise/ stretch, min v.c    Person(s) Educated  Patient    Methods  Explanation;Demonstration;Verbal cues;Handout    Comprehension  Verbalized understanding;Returned demonstration;Verbal cues required       OT Short Term Goals - 07/10/18 1427      OT SHORT TERM GOAL #1   Title  Pt will be independent with PD specific  HEP.--    Time  4    Period  Weeks    Status  New      OT SHORT TERM GOAL #2   Title  Pt will verbalize understanding of ways  to prevent future complications and current available/appropriate community resources prn.    Time  4    Period  Weeks    Status  New      OT SHORT TERM GOAL #3   Title  Pt will demonstrate improved fine motor coordination for ADLS as evidenced by decreasing LUE 9 hole peg test score to 47 secs or less.    Baseline  RUE 38.40, LUE 50.07 secs    Time  4    Period  Weeks    Status  New      OT SHORT TERM GOAL #4   Title  Pt will report incr ease with donning shirt, pants socks/shoes using adaptive strategies/AE prn.    Time  4    Period  Weeks    Status  New        OT Long Term Goals - 07/10/18 1430      OT LONG TERM GOAL #1   Title  Pt will verbalize understanding of adapted strategies for ADLs/IADLs including strategies to minimize freezing episodes    Time  8    Period  Weeks    Status  New      OT LONG TERM GOAL #2   Title  Pt will demonstrate improved ease with donning / doffing jacket as evidenced by decreasing PPT# 4 to 35 secs or less.    Time  8    Period   Weeks    Status  New      OT LONG TERM GOAL #3   Title  ------------------------------      OT LONG TERM GOAL #4   Title  ------------------------------------------      OT LONG TERM GOAL #5   Title  ---------------------            Plan - 08/09/18 1425    Clinical Impression Statement  Pt is progressing towards goals he demonstrates improved flexibility and amplitude of movements with repetition and review.    Occupational Profile and client history currently impacting functional performance  PMH that includes: seeing illusions, hx of falls, hx of significant depression, mild cognitive impairment, sleep apnea, hypogonadism, ADD. Pt reports recent falls and increased difficulty with orienting clothes to donn them.Pt retired due to difficulty at work and no longer drives. Pt reports slowing of ADLs and incr cognitive deficits impacting function.     Occupational performance deficits (Please refer to evaluation for details):  ADL's;IADL's;Leisure;Social Participation    Rehab Potential  Good    Current Impairments/barriers affecting progress:  cognitive deficits, depression, decreased balance, freezing episodes    OT Frequency  2x / week    OT Duration  8 weeks    OT Treatment/Interventions  Self-care/ADL training;Moist Heat;Fluidtherapy;Balance training;Therapeutic activities;Cognitive remediation/compensation;Therapeutic exercise;Ultrasound;Cryotherapy;Neuromuscular education;Visual/perceptual remediation/compensation;Passive range of motion;Functional Mobility Training;Patient/family education;Manual Therapy;Energy conservation;Paraffin;DME and/or AE instruction;Aquatic Therapy    Plan  ADL strategies, dynamic step and reach    Consulted and Agree with Plan of Care  Patient       Patient will benefit from skilled therapeutic intervention in order to improve the following deficits and impairments:  Decreased cognition, Impaired flexibility, Decreased mobility, Decreased  coordination, Decreased endurance, Decreased range of motion, Decreased strength, Impaired UE functional use, Impaired tone, Impaired perceived functional ability, Decreased safety awareness, Difficulty walking, Decreased balance, Decreased activity tolerance, Impaired vision/preception, Improper spinal/pelvic alignment  Visit Diagnosis: Other symptoms and signs involving the musculoskeletal system  Other symptoms and signs involving the nervous system  Other lack of coordination  Unsteadiness on feet  Attention and concentration deficit  Abnormal posture    Problem List Patient Active Problem List   Diagnosis Date Noted  . Left foot pain 07/26/2018  . TMJ (temporomandibular joint syndrome) 10/10/2017  . Parkinson's disease ([place]) 03/20/2017  . Bilateral lower extremity edema 03/20/2017  . Chronic venous insufficiency 09/13/2016  . Fatigue 03/15/2016  . Parkinsonian features 09/15/2015  . ED (erectile dysfunction) 09/15/2015  . MDD (major depressive disorder), recurrent episode, severe ([place]) 02/10/2014  . Nonspecific abnormal electrocardiogram (ECG) (EKG) 02/07/2014  . Non-compliant behavior 02/03/2014  . Morton's neuroma of left foot 08/07/2013  . Attention deficit disorder without mention of hyperactivity 03/13/2012  . Hyperglycemia 01/26/2012  . Hypogonadism male 01/24/2012  . Syncope and collapse 01/20/2012  .  OSA (obstructive sleep apnea) 01/17/2011  . CHEST TIGHTNESS 02/18/2008    , 08/09/2018, 2:27 PM Theone Murdoch, OTR/L [phone] Phone: [phone] 2:29 PM 11/07/19[facility] [name] [place], [place], [address] Phone: [phone]   Fax:  [phone]  Name: [name] [name] MRN: [id number] Date of Birth: [birth year]

## 2018-08-09 NOTE — Telephone Encounter (Signed)
No changes needed 

## 2018-08-09 NOTE — Patient Instructions (Signed)
Tips to reduce freezing episodes with standing or walking:  1. Stand tall with your feet wide, so that you can rock and weight shift through your hips. 2. Don't try to fight the freeze: if you begin taking slower, faster, smaller steps, STOP, get your posture tall, and RESET your posture and balance.  Take a deep breath before taking the BIG step to start again. 3. March in place, with high knee stepping, to get started walking again. 4. Use auditory cues:  Count out loud, think of a familiar tune or song or cadence, use pocket metronome, to use rhythm to get started walking again. 5. Use visual cues:  Use a line to step over, use laser pointer line to step over, (using BIG steps) to start walking again. 6. Use visual targets to keep your posture tall (look ahead and focus on an object or target at eye level). 7. As you approach where your destination with walking, count your steps out loud and/or focus on your target with your eyes until you are fully there. 8. Use appropriate assistive device, as advised by your physical therapist to assist with taking longer, consistent steps.-USE YOUR CANE with the rubber tip base

## 2018-08-09 NOTE — Patient Instructions (Signed)
  [name] [name], you must practice your loud "AH!" 5x twice a day, then read aloud your phrases so that your wife can hear and understand them in the next room - Or read aloud from the paper, book or magazine for 5 minutes so your wife can hear you in the next room  Consider keeping a mini notebook with you to pre-plan conversations, topics, information you want to share before hand, or to help you recall important things you need to tell your wife.   Use the strategy of "stop, think, look around" before you start a task or leave a room to help you remember what you need to do. Repeat in your head what you are doing until you complete task  Bring back your [facility] completed  Have a good weekend

## 2018-08-09 NOTE — Telephone Encounter (Signed)
Dr. Sharlet Salina, We are seeing [name] [name] to address his Parkinson's disease.  He has been complaining of foot pain and I seen in his chart a history of healing fracture on his left foot.  Are there any restrictions or precautions that we need to be aware of in regards to this and physical therapy?  Thank you.  Mady Haagensen, PT

## 2018-08-09 NOTE — Therapy (Signed)
[facility] [address] [place], [place], [address] Phone: [phone]   Fax:  [phone]  Speech Language Pathology Treatment  Patient Details  Name: [name] [name] MRN: [id number] Date of Birth: [birth year] Referring Provider (SLP): Alonza Bogus, DO   Encounter Date: 08/09/2018  End of Session - 08/09/18 1138    Visit Number  4    Number of Visits  13    Date for SLP Re-Evaluation  08/24/18    SLP Start Time  51    SLP Stop Time   1102    SLP Time Calculation (min)  42 min    Activity Tolerance  Patient limited by pain       Past Medical History:  Diagnosis Date  .  OSA (obstructive sleep apnea)    npsg 2012:  AHI 67/hr. Auto titration 2012:  Optimal pressure 12cm.   . APPENDECTOMY, HX OF 02/18/2008   Qualifier: Diagnosis of  By: [place], [place]    . Cardiac murmur    as a child  . Chronic idiopathic constipation   . Depression   . Headache(784.0)   . HEADACHES, HX OF 02/18/2008   Qualifier: Diagnosis of  By: Danny Lawless CMA, [place]    . Parkinson disease ([place]) 11/2014  . Sinus mucosal thickening    pt unable to lay flat  . Streptococcal meningitis    as an infant    Past Surgical History:  Procedure Laterality Date  . APPENDECTOMY  1967  . COLONOSCOPY    . NASAL SINUS SURGERY     x 4 as a child  . VASECTOMY      There were no vitals filed for this visit.  Subjective Assessment - 08/09/18 1031    Subjective  "I can't remember what I was going to say"    Currently in Pain?  No/denies            ADULT SLP TREATMENT - 08/09/18 1034      General Information   Behavior/Cognition  Alert;Cooperative;Pleasant mood      Treatment Provided   Treatment provided  Cognitive-Linquistic      Cognitive-Linquistic Treatment   Treatment focused on  Dysarthria;Aphasia    Skilled Treatment  Pt reports doing "AH" once a day and not reading phrases. Generated compensations of writing down  topics, information he wants to talk about prior to conversations, in a mini notebook. Also trained pt in comepnsation of repeating to himself what he is going into a room for or what task he needs to complete to aid recall. Re-calibrated volume with loud "HEY AH!" average of 87dB. Structured speech tasks incoorprorating reading and spontaneous sentence responses with frequent mod A for volume, over articulation.        SLP Education - 08/09/18 1135    Education provided  Yes    Education Details  complete HEP as prescribed; compensations for memory and attention, as well as participating in conversation    Person(s) Educated  Patient    Methods  Explanation;Demonstration;Verbal cues;Handout    Comprehension  Verbalized understanding;Returned demonstration;Verbal cues required       SLP Short Term Goals - 08/09/18 1138      SLP SHORT TERM GOAL #1   Title  Pt will carryover 2 compensations for attention/memory at home to complete daily chores/tasks with occasional min A over 2 sessions    Time  2    Period  Weeks    Status  On-going  SLP SHORT TERM GOAL #2   Title  Pt will report carryover of compensations for slow processing in coversations (comprehension and expression) with occasional min A over 2 sessions    Time  2    Period  Weeks    Status  On-going      SLP SHORT TERM GOAL #3   Title  Pt will utilize compensations for dysarthria in structured tasks with occasional min A in three sessions    Time  2    Period  Weeks    Status  On-going      SLP SHORT TERM GOAL #4   Title  Formal cognitive linguistic assessment as indicated    Time  2    Period  Weeks    Status  On-going       SLP Long Term Goals - 08/09/18 1138      SLP LONG TERM GOAL #1   Title  pt will demo attention skills adequate for functional verbal expression in 10 minutes simple-mod complex conversation over two sessions    Time  5    Period  Weeks    Status  On-going      SLP LONG TERM GOAL #2    Title  Pt will be intelligible in 15 minute converation in mildly noisy environment with occasional min A over 2 sessions    Time  5    Period  Weeks    Status  On-going      SLP LONG TERM GOAL #3   Title  Pt will report 25% reduction (subjectively) in requests for repetition or being told he is mumbling by his spouse over 3 sessions    Time  5    Status  On-going      SLP LONG TERM GOAL #4   Title  Pt will reports carryover of environmental compensations for improved intelligibility outside of therapy over 2 sessions    Time  5    Period  Weeks    Status  On-going       Plan - 08/09/18 1136    Clinical Impression Statement  Pt with suboptimal loudness in conversation especially with incr'd cognitive load. In pleasantries and light social conversation pt's volume and verbal expression is [facility]. Generated strategies for attention, participation in conversation (scipting, preplanning, writing down topics he wants to discuss) and memory. Pt would cont to benefit from skilled ST to target language, speech, and cognition.     Speech Therapy Frequency  2x / week    Duration  --   6 weeks or 3 visits   Treatment/Interventions  Language facilitation;Internal/external aids;Compensatory techniques;SLP instruction and feedback;Multimodal communcation approach;Cognitive reorganization;Functional tasks;Cueing hierarchy;Patient/family education    Potential to Achieve Goals  Good    Potential Considerations  Severity of impairments;Previous level of function;Cooperation/participation level    Consulted and Agree with Plan of Care  Patient       Patient will benefit from skilled therapeutic intervention in order to improve the following deficits and impairments:   Dysarthria and anarthria  Cognitive communication deficit    Problem List Patient Active Problem List   Diagnosis Date Noted  . Left foot pain 07/26/2018  . TMJ (temporomandibular joint syndrome) 10/10/2017  . Parkinson's disease  ([place]) 03/20/2017  . Bilateral lower extremity edema 03/20/2017  . Chronic venous insufficiency 09/13/2016  . Fatigue 03/15/2016  . Parkinsonian features 09/15/2015  . ED (erectile dysfunction) 09/15/2015  . MDD (major depressive disorder), recurrent episode, severe ([place]) 02/10/2014  .  Nonspecific abnormal electrocardiogram (ECG) (EKG) 02/07/2014  . Non-compliant behavior 02/03/2014  . Morton's neuroma of left foot 08/07/2013  . Attention deficit disorder without mention of hyperactivity 03/13/2012  . Hyperglycemia 01/26/2012  . Hypogonadism male 01/24/2012  . Syncope and collapse 01/20/2012  .  OSA (obstructive sleep apnea) 01/17/2011  . CHEST TIGHTNESS 02/18/2008    Latria Mccarron, Annye Rusk MS, CCC-SLP 08/09/2018, 11:39 AM  [facility] [address] [place], [place], [address] Phone: [phone]   Fax:  [phone]   Name: [name] [name] MRN: [id number] Date of Birth: 08/27/1951

## 2018-08-10 NOTE — Therapy (Signed)
[facility] [address] [place], [place], [address] Phone: [phone]   Fax:  [phone]  Physical Therapy Treatment  Patient Details  Name: [name] [name] MRN: [id number] Date of Birth: [birth year] Referring Provider (PT): Dr. Carles Collet   Encounter Date: 08/09/2018  PT End of Session - 08/10/18 1317    Visit Number  4    Number of Visits  17    Date for PT Re-Evaluation  10/08/18    Authorization Type  Medicare and [place] visits (each discipline counts as 1 visit); 08/09/18:  per count-36 visits used, 39 remain (PT, OT, speech to each use 12)    Authorization - Visit Number  4    Authorization - Number of Visits  12    PT Start Time  9476    PT Stop Time  0930    PT Time Calculation (min)  43 min    Activity Tolerance  Patient tolerated treatment well    Behavior During Therapy  Flat affect   Disctracted from conversation at hand      Past Medical History:  Diagnosis Date  .  OSA (obstructive sleep apnea)    npsg 2012:  AHI 67/hr. Auto titration 2012:  Optimal pressure 12cm.   . APPENDECTOMY, HX OF 02/18/2008   Qualifier: Diagnosis of  By: [place], [place]    . Cardiac murmur    as a child  . Chronic idiopathic constipation   . Depression   . Headache(784.0)   . HEADACHES, HX OF 02/18/2008   Qualifier: Diagnosis of  By: Danny Lawless CMA, [place]    . Parkinson disease ([place]) 11/2014  . Sinus mucosal thickening    pt unable to lay flat  . Streptococcal meningitis    as an infant    Past Surgical History:  Procedure Laterality Date  . APPENDECTOMY  1967  . COLONOSCOPY    . NASAL SINUS SURGERY     x 4 as a child  . VASECTOMY      There were no vitals filed for this visit.  Subjective Assessment - 08/09/18 0850    Subjective  Dr. Sharlet Salina did confirm by x-ray that there was a fracture of L 5th metatarsal.  She said there is nothing really that can be done.  Went to [employer] and tried some new  shoes-have ordered them and waiting for them to come in.    Pertinent History  depression, Parkinson's disease    Patient Stated Goals  Pt's goals are to help with getting my foot better (explained this is a new injury and he would need to see MD specifically about that).  Also says to help with stiffness and shuffling.    Currently in Pain?  No/denies    Pain Onset  --    Aggravating Factors   "I honestly don't know."    Pain Relieving Factors  unsure                       [facility] Adult PT Treatment/Exercise - 08/10/18 1313      Ambulation/Gait   Ambulation/Gait  Yes    Ambulation/Gait Assistance  5: Supervision    Ambulation Distance (Feet)  120 Feet   x 2, then 200 ft, then 30 ft x 2   Assistive device  None;Straight cane   with rubber quad tip   Gait Pattern  Step-through pattern;Decreased arm swing - right;Decreased arm swing - left;Decreased step length - right;Decreased  step length - left;Trunk flexed;Narrow base of support;Poor foot clearance - left;Poor foot clearance - right    Ambulation Surface  Level;Indoor    Gait Comments  Practicing turning to sit, using cues for counting to approach target, stepping/weightshifting to initiate lifting feet from floor.      Discussed benefits of using cane for mobility:  Improved stability>improved confidence, improved step length and foot clearance, and improved efficiency with gait.  Each time PT begins to discuss use of cane with patient, he asks another question about freezing with turns.  Attempted to focus on turning to sit at varied locations in gym:  Pt tends to not bring cane with these activities.  Provided varied cues:  Marching in place for turns, counting out loud as approaching target. Discussed stopping to reset posture and start again with BIG effort with strategy to reduce freezing.  Pt feels he has a hard time remembering to do these things.  Educated pt in benefits of external cues (ie-wife giving cues at  certain times before or when pt gets "stuck" to get him out of "freeze" episode sooner.)  Wife not present at session, and pt does not offer to invite her to session for education.       PT Education - 08/10/18 1316    Education Details  Recommendation for use of cane to allow for incr step length and decreased shuffling of gait; ways to decrease festination episodes with turns    Person(s) Educated  Patient    Methods  Explanation;Demonstration;Verbal cues;Handout    Comprehension  Verbalized understanding;Returned demonstration;Verbal cues required;Need further instruction       PT Short Term Goals - 07/10/18 2155      PT SHORT TERM GOAL #1   Title  Pt will be independent with HEP to target Parkinson's specific deficits.  TARGET for all STGs 08/31/18 (due to delayed start for visits after eval)    Time  4    Period  Weeks    Status  New      PT SHORT TERM GOAL #2   Title  Pt will improve TUG score to less than or equal to 15 seconds for decreased fall risk.    Time  4    Period  Weeks    Status  New      PT SHORT TERM GOAL #3   Title  Pt will improve DGI score to at least 15/24 for decreased fall risk.    Time  4    Period  Weeks    Status  New      PT SHORT TERM GOAL #4   Title  Pt will report at least 50% improvement in car transfers.    Time  4    Period  Weeks    Status  New      PT SHORT TERM GOAL #5   Title  Pt will verbalize understanding of fall prevention in home environment.    Time  4    Period  Weeks    Status  New        PT Long Term Goals - 07/10/18 2159      PT LONG TERM GOAL #1   Title  Pt/wife will verbalize understanding of tips to reduce freezing with gait.  UPDATED TARGET for all LTGs 09/28/18    Time  8    Period  Weeks    Status  New      PT LONG TERM GOAL #2   Title  Pt will improve gait velocity score to at least 2 ft/sec for improved gait efficiency and safety.    Time  8    Period  Weeks    Status  New      PT LONG TERM GOAL  #3   Title  Pt will improve DGI score to at least 19/24 for decreased fall risk.    Time  8    Period  Weeks    Status  New      PT LONG TERM GOAL #4   Title  Pt will improve TUG cognitive score to less than or equal to 20 seconds for decreased fall risk.    Time  8    Period  Weeks    Status  New            Plan - 08/10/18 1322    Clinical Impression Statement  Skilled PT session today focused on gait training with use of cane, addressing freezing episodes with turns.  PT followed up with Dr. Sharlet Salina regarding pt's L foot and she reports no precautions, restrictions.  PT attempted to instruct patient in use of cane and benefits of cane; however, pt continues to ask about freezing with turns (and does not use cane during this practice).  Trialed various strategies for turns, and pt has difficulty throughout session.    Rehab Potential  Good    Clinical Impairments Affecting Rehab Potential  Memory deficits per speech eval notes    PT Frequency  2x / week    PT Duration  8 weeks   plus eval   PT Treatment/Interventions  ADLs/Self Care Home Management;DME Instruction;Balance training;Therapeutic exercise;Therapeutic activities;Functional mobility training;Gait training;Neuromuscular re-education;Patient/family education    PT Next Visit Plan  Per Dr. Sharlet Salina:  No changes needed in PT plan regarding his foot.  Continue to train in turning, tips to reduce freezing with turns (have not yet found the best method for reduction in freezing); review HEP and update as needed.  Gait training with focus on using cane.    Consulted and Agree with Plan of Care  Patient       Patient will benefit from skilled therapeutic intervention in order to improve the following deficits and impairments:  Abnormal gait, Decreased balance, Decreased mobility, Decreased coordination, Decreased strength, Difficulty walking, Impaired flexibility, Postural dysfunction  Visit Diagnosis: Other abnormalities of  gait and mobility  Unsteadiness on feet  Other symptoms and signs involving the nervous system     Problem List Patient Active Problem List   Diagnosis Date Noted  . Left foot pain 07/26/2018  . TMJ (temporomandibular joint syndrome) 10/10/2017  . Parkinson's disease ([place]) 03/20/2017  . Bilateral lower extremity edema 03/20/2017  . Chronic venous insufficiency 09/13/2016  . Fatigue 03/15/2016  . Parkinsonian features 09/15/2015  . ED (erectile dysfunction) 09/15/2015  . MDD (major depressive disorder), recurrent episode, severe ([place]) 02/10/2014  . Nonspecific abnormal electrocardiogram (ECG) (EKG) 02/07/2014  . Non-compliant behavior 02/03/2014  . Morton's neuroma of left foot 08/07/2013  . Attention deficit disorder without mention of hyperactivity 03/13/2012  . Hyperglycemia 01/26/2012  . Hypogonadism male 01/24/2012  . Syncope and collapse 01/20/2012  .  OSA (obstructive sleep apnea) 01/17/2011  . CHEST TIGHTNESS 02/18/2008    , W. 08/10/2018, 1:27 PM  Frazier Butt., PT  [facility] [address] [place], [place], [address] Phone: [phone]   Fax:  [phone]  Name: [name] [name] MRN: [id number] Date of Birth: November 27, 1950

## 2018-08-14 ENCOUNTER — Ambulatory Visit: Payer: Medicare Other | Admitting: Occupational Therapy

## 2018-08-14 ENCOUNTER — Ambulatory Visit: Payer: Medicare Other | Admitting: Physical Therapy

## 2018-08-14 ENCOUNTER — Encounter: Payer: Self-pay | Admitting: Occupational Therapy

## 2018-08-14 ENCOUNTER — Ambulatory Visit: Payer: Medicare Other | Admitting: Speech Pathology

## 2018-08-14 ENCOUNTER — Encounter: Payer: Self-pay | Admitting: Physical Therapy

## 2018-08-14 ENCOUNTER — Encounter: Payer: Self-pay | Admitting: Speech Pathology

## 2018-08-14 DIAGNOSIS — R41840 Attention and concentration deficit: Secondary | ICD-10-CM | POA: Diagnosis not present

## 2018-08-14 DIAGNOSIS — N3941 Urge incontinence: Secondary | ICD-10-CM | POA: Diagnosis not present

## 2018-08-14 DIAGNOSIS — R29818 Other symptoms and signs involving the nervous system: Secondary | ICD-10-CM | POA: Diagnosis not present

## 2018-08-14 DIAGNOSIS — R2689 Other abnormalities of gait and mobility: Secondary | ICD-10-CM

## 2018-08-14 DIAGNOSIS — R2681 Unsteadiness on feet: Secondary | ICD-10-CM | POA: Diagnosis not present

## 2018-08-14 DIAGNOSIS — R41841 Cognitive communication deficit: Secondary | ICD-10-CM

## 2018-08-14 DIAGNOSIS — R471 Dysarthria and anarthria: Secondary | ICD-10-CM

## 2018-08-14 DIAGNOSIS — R29898 Other symptoms and signs involving the musculoskeletal system: Secondary | ICD-10-CM

## 2018-08-14 DIAGNOSIS — R350 Frequency of micturition: Secondary | ICD-10-CM | POA: Diagnosis not present

## 2018-08-14 DIAGNOSIS — R278 Other lack of coordination: Secondary | ICD-10-CM | POA: Diagnosis not present

## 2018-08-14 NOTE — Therapy (Signed)
[facility] [address] [place], [place], [address] Phone: [phone]   Fax:  [phone]  Occupational Therapy Treatment  Patient Details  Name: [name] [name] MRN: [id number] Date of Birth: 02/24/1951 Referring Provider (OT): Dr. Carles Collet   Encounter Date: 08/14/2018  OT End of Session - 08/14/18 1023    Visit Number  5    Number of Visits  17    Date for OT Re-Evaluation  09/08/18    Authorization Type  Medicare and federal [employer]--75 visit limit combined OT, PT, ST for year    Authorization Time Period  anticipate seeing pt for approx 6 weeks -12 visits due to visit limit    Authorization - Visit Number  5    Authorization - Number of Visits  12    OT Start Time  1020   2 units only due to medical issue   OT Stop Time  1105    OT Time Calculation (min)  45 min    Activity Tolerance  Patient tolerated treatment well    Behavior During Therapy  Flat affect       Past Medical History:  Diagnosis Date  .  OSA (obstructive sleep apnea)    npsg 2012:  AHI 67/hr. Auto titration 2012:  Optimal pressure 12cm.   . APPENDECTOMY, HX OF 02/18/2008   Qualifier: Diagnosis of  By: [place], [place]    . Cardiac murmur    as a child  . Chronic idiopathic constipation   . Depression   . Headache(784.0)   . HEADACHES, HX OF 02/18/2008   Qualifier: Diagnosis of  By: Danny Lawless CMA, [place]    . Parkinson disease ([place]) 11/2014  . Sinus mucosal thickening    pt unable to lay flat  . Streptococcal meningitis    as an infant    Past Surgical History:  Procedure Laterality Date  . APPENDECTOMY  1967  . COLONOSCOPY    . NASAL SINUS SURGERY     x 4 as a child  . VASECTOMY      There were no vitals filed for this visit.  Subjective Assessment - 08/14/18 1323    Subjective   Pt reports difficulty donning shirt     Pertinent History  Parkinson's disease, seeing illusions, hx of falls, hx of significant depression, mild  cognitive impairment, sleep apnea, hypogonadism, ADD    Patient Stated Goals  improved balance and ease with ADLs    Currently in Pain?  No/denies            Treatment: Sitting stretches to reach for floor for dressing, as pt reports continued difficulty with dressing. Pt reports lightheadedness, BP was monitored and okay. Pt ambulated to private room and reported feeling more dizzy. BP was checked in seated and standing and it was okay. Water provided as pt reports not drinking much today. Pt's BP appears little low for pt.  PWR! Up, rock and twist performed 10 reps each, min v.c  Education performed regarding strategy to donn/ doff shirt as pt reports difficulty with figuring out back/ front etc, mod v.c P was accompanied to his car, and therapist educated pt in correct car transfer for safety(sitting down on seat first). Pt's wife was made aware that pt is not feeling well. Therapist recommended pt does not go to rock steady boxing today due to not fellling well and being off balance. Pt' wife verbalized understanding.  OT Short Term Goals - 07/10/18 1427      OT SHORT TERM GOAL #1   Title  Pt will be independent with PD specific  HEP.--    Time  4    Period  Weeks    Status  New      OT SHORT TERM GOAL #2   Title  Pt will verbalize understanding of ways to prevent future complications and current available/appropriate community resources prn.    Time  4    Period  Weeks    Status  New      OT SHORT TERM GOAL #3   Title  Pt will demonstrate improved fine motor coordination for ADLS as evidenced by decreasing LUE 9 hole peg test score to 47 secs or less.    Baseline  RUE 38.40, LUE 50.07 secs    Time  4    Period  Weeks    Status  New      OT SHORT TERM GOAL #4   Title  Pt will report incr ease with donning shirt, pants socks/shoes using adaptive strategies/AE prn.    Time  4    Period  Weeks    Status  New        OT Long Term Goals -  07/10/18 1430      OT LONG TERM GOAL #1   Title  Pt will verbalize understanding of adapted strategies for ADLs/IADLs including strategies to minimize freezing episodes    Time  8    Period  Weeks    Status  New      OT LONG TERM GOAL #2   Title  Pt will demonstrate improved ease with donning / doffing jacket as evidenced by decreasing PPT# 4 to 35 secs or less.    Time  8    Period  Weeks    Status  New      OT LONG TERM GOAL #3   Title  ------------------------------      OT LONG TERM GOAL #4   Title  ------------------------------------------      OT LONG TERM GOAL #5   Title  ---------------------            Plan - 08/14/18 1325    Clinical Impression Statement  Pt was not feeling well today. He reports dizziness and lightheadedness. Therapist monitored BP and assisted pt to his wife's car.    Occupational Profile and client history currently impacting functional performance  PMH that includes: seeing illusions, hx of falls, hx of significant depression, mild cognitive impairment, sleep apnea, hypogonadism, ADD. Pt reports recent falls and increased difficulty with orienting clothes to donn them.Pt retired due to difficulty at work and no longer drives. Pt reports slowing of ADLs and incr cognitive deficits impacting function.     Rehab Potential  Good    Current Impairments/barriers affecting progress:  cognitive deficits, depression, decreased balance, freezing episodes    OT Frequency  2x / week    OT Duration  8 weeks    OT Treatment/Interventions  Self-care/ADL training;Moist Heat;Fluidtherapy;Balance training;Therapeutic activities;Cognitive remediation/compensation;Therapeutic exercise;Ultrasound;Cryotherapy;Neuromuscular education;Visual/perceptual remediation/compensation;Passive range of motion;Functional Mobility Training;Patient/family education;Manual Therapy;Energy conservation;Paraffin;DME and/or AE instruction;Aquatic Therapy    Plan  ADL strategies,  dynamic step and reach    Consulted and Agree with Plan of Care  Patient       Patient will benefit from skilled therapeutic intervention in order to improve the following deficits and impairments:  Decreased cognition, Impaired flexibility, Decreased mobility, Decreased coordination, Decreased  endurance, Decreased range of motion, Decreased strength, Impaired UE functional use, Impaired tone, Impaired perceived functional ability, Decreased safety awareness, Difficulty walking, Decreased balance, Decreased activity tolerance, Impaired vision/preception, Improper spinal/pelvic alignment  Visit Diagnosis: Other symptoms and signs involving the nervous system  Other symptoms and signs involving the musculoskeletal system  Other lack of coordination  Attention and concentration deficit  Unsteadiness on feet    Problem List Patient Active Problem List   Diagnosis Date Noted  . Left foot pain 07/26/2018  . TMJ (temporomandibular joint syndrome) 10/10/2017  . Parkinson's disease ([place]) 03/20/2017  . Bilateral lower extremity edema 03/20/2017  . Chronic venous insufficiency 09/13/2016  . Fatigue 03/15/2016  . Parkinsonian features 09/15/2015  . ED (erectile dysfunction) 09/15/2015  . MDD (major depressive disorder), recurrent episode, severe ([place]) 02/10/2014  . Nonspecific abnormal electrocardiogram (ECG) (EKG) 02/07/2014  . Non-compliant behavior 02/03/2014  . Morton's neuroma of left foot 08/07/2013  . Attention deficit disorder without mention of hyperactivity 03/13/2012  . Hyperglycemia 01/26/2012  . Hypogonadism male 01/24/2012  . Syncope and collapse 01/20/2012  .  OSA (obstructive sleep apnea) 01/17/2011  . CHEST TIGHTNESS 02/18/2008    , 08/14/2018, 1:29 PM  [facility] [address] [place], [place], [address] Phone: [phone]   Fax:  [phone]  Name: [name] [name] MRN: [id number] Date  of Birth: 1950/10/10

## 2018-08-14 NOTE — Therapy (Signed)
[facility] [address] [place], [place], [address] Phone: [phone]   Fax:  [phone]  Speech Language Pathology Treatment  Patient Details  Name: [name] [name] MRN: [id number] Date of Birth: 1951/01/21 Referring Provider (SLP): Alonza Bogus, DO   Encounter Date: 08/14/2018  End of Session - 08/14/18 1556    Visit Number  5    Number of Visits  13    Date for SLP Re-Evaluation  08/24/18    SLP Start Time  0844    SLP Stop Time   0932    SLP Time Calculation (min)  48 min    Activity Tolerance  Patient tolerated treatment well       Past Medical History:  Diagnosis Date  .  OSA (obstructive sleep apnea)    npsg 2012:  AHI 67/hr. Auto titration 2012:  Optimal pressure 12cm.   . APPENDECTOMY, HX OF 02/18/2008   Qualifier: Diagnosis of  By: [place], [place]    . Cardiac murmur    as a child  . Chronic idiopathic constipation   . Depression   . Headache(784.0)   . HEADACHES, HX OF 02/18/2008   Qualifier: Diagnosis of  By: Danny Lawless CMA, [place]    . Parkinson disease ([place]) 11/2014  . Sinus mucosal thickening    pt unable to lay flat  . Streptococcal meningitis    as an infant    Past Surgical History:  Procedure Laterality Date  . APPENDECTOMY  1967  . COLONOSCOPY    . NASAL SINUS SURGERY     x 4 as a child  . VASECTOMY      There were no vitals filed for this visit.  Subjective Assessment - 08/14/18 1550    Subjective  "I got this from my friend's wife" re: sound level meter            ADULT SLP TREATMENT - 08/14/18 0906      General Information   Behavior/Cognition  Alert;Cooperative;Pleasant mood      Cognitive-Linquistic Treatment   Treatment focused on  Dysarthria;Aphasia    Skilled Treatment  Pt brought in his therapy note book, as well as a sound level meter he was given  - instructed pt on use with target of 68 to 70dB  with usual mi A. Loud /a/ to recalibrate volume  average 85dB.- pt attended to conversation re: history over 12 minutes with occasional min verbal cues and extended time for processing after a distraction or interuption by ST. He reports looking at his calendar at home to recall appointments and date.       Assessment / Recommendations / Plan   Plan  Continue with current plan of care      Progression Toward Goals   Progression toward goals  Progressing toward goals       SLP Education - 08/14/18 1552    Education provided  Yes    Education Details  use of sound level meter for loud /a/ and rote speech tasks; compensations for memory and attention    Person(s) Educated  Patient    Methods  Explanation;Demonstration;Verbal cues;Handout    Comprehension  Verbalized understanding;Returned demonstration;Verbal cues required       SLP Short Term Goals - 08/14/18 1555      SLP SHORT TERM GOAL #1   Title  Pt will carryover 2 compensations for attention/memory at home to complete daily chores/tasks with occasional min A over 2 sessions    Time  1    Period  Weeks    Status  On-going      SLP SHORT TERM GOAL #2   Title  Pt will report carryover of compensations for slow processing in coversations (comprehension and expression) with occasional min A over 2 sessions    Time  1    Period  Weeks    Status  On-going      SLP SHORT TERM GOAL #3   Title  Pt will utilize compensations for dysarthria in structured tasks with occasional min A in three sessions    Baseline  08/14/18    Time  21    Period  Weeks    Status  On-going      SLP SHORT TERM GOAL #4   Title  Formal cognitive linguistic assessment as indicated    Time  2    Period  Weeks    Status  On-going       SLP Long Term Goals - 08/14/18 1555      SLP LONG TERM GOAL #1   Title  pt will demo attention skills adequate for functional verbal expression in 10 minutes simple-mod complex conversation over two sessions    Time  4    Period  Weeks    Status  On-going       SLP LONG TERM GOAL #2   Title  Pt will be intelligible in 15 minute converation in mildly noisy environment with occasional min A over 2 sessions    Time  4    Period  Weeks    Status  On-going      SLP LONG TERM GOAL #3   Title  Pt will report 25% reduction (subjectively) in requests for repetition or being told he is mumbling by his spouse over 3 sessions    Time  4    Status  On-going      SLP LONG TERM GOAL #4   Title  Pt will reports carryover of environmental compensations for improved intelligibility outside of therapy over 2 sessions    Time  4    Period  Weeks    Status  On-going       Plan - 08/14/18 1553    Clinical Impression Statement  Pt with suboptimal loudness in conversation especially with incr'd cognitive load. In pleasantries and light social conversation pt's volume and verbal expression is [facility]. Generated strategies for attention, participation in conversation (scipting, preplanning, writing down topics he wants to discuss) and memory. Pt brought in a sound level meter gifted to him. Trained pt in setting and use of this as he verbalized motivation to use the machine and has been inconsistent with practice. Pt would cont to benefit from skilled ST to target language, speech, and cognition.     Speech Therapy Frequency  2x / week    Treatment/Interventions  Language facilitation;Internal/external aids;Compensatory techniques;SLP instruction and feedback;Multimodal communcation approach;Cognitive reorganization;Functional tasks;Cueing hierarchy;Patient/family education    Potential to Achieve Goals  Good    Potential Considerations  Severity of impairments;Previous level of function;Cooperation/participation level    Consulted and Agree with Plan of Care  Patient       Patient will benefit from skilled therapeutic intervention in order to improve the following deficits and impairments:   Dysarthria and anarthria  Cognitive communication deficit    Problem  List Patient Active Problem List   Diagnosis Date Noted  . Left foot pain 07/26/2018  . TMJ (temporomandibular joint syndrome) 10/10/2017  . Parkinson's  disease ([place]) 03/20/2017  . Bilateral lower extremity edema 03/20/2017  . Chronic venous insufficiency 09/13/2016  . Fatigue 03/15/2016  . Parkinsonian features 09/15/2015  . ED (erectile dysfunction) 09/15/2015  . MDD (major depressive disorder), recurrent episode, severe ([place]) 02/10/2014  . Nonspecific abnormal electrocardiogram (ECG) (EKG) 02/07/2014  . Non-compliant behavior 02/03/2014  . Morton's neuroma of left foot 08/07/2013  . Attention deficit disorder without mention of hyperactivity 03/13/2012  . Hyperglycemia 01/26/2012  . Hypogonadism male 01/24/2012  . Syncope and collapse 01/20/2012  .  OSA (obstructive sleep apnea) 01/17/2011  . CHEST TIGHTNESS 02/18/2008    Milliana Reddoch, Annye Rusk MS, CCC-SLP 08/14/2018, 3:57 PM  [facility] [address] [place], [place], [address] Phone: [phone]   Fax:  [phone]   Name: KANIEL KIANG MRN: [id number] Date of Birth: October 15, 1950

## 2018-08-14 NOTE — Patient Instructions (Signed)
  Put Sound Level Meter on F and A  Loud AH - try for 90dB average  Do rote speech tasks - try for average of 70dB  Row your boat Barnabas Lister and [place] [employer]ream[employer] Prayer [place] Address Joycelyn Das (speak it)  Any nursery rhymes

## 2018-08-14 NOTE — Patient Instructions (Signed)
Verbally requested pt add back in his recumbent bike to his routine, 10 minutes 2x/day

## 2018-08-14 NOTE — Therapy (Signed)
[facility] [address] [place], [place], [address] Phone: [phone]   Fax:  [phone]  Physical Therapy Treatment  Patient Details  Name: [name] [name] MRN: [id number] Date of Birth: [birth year] Referring Provider (PT): Dr. Carles Collet   Encounter Date: 08/14/2018  PT End of Session - 08/14/18 1304    Visit Number  5    Number of Visits  17    Date for PT Re-Evaluation  10/08/18    Authorization Type  Medicare and [place] visits (each discipline counts as 1 visit); 08/09/18:  per count-36 visits used, 39 remain (PT, OT, speech to each use 12)    Authorization - Visit Number  5    Authorization - Number of Visits  12    PT Start Time  [phone]   late coming from speech   PT Stop Time  1017    PT Time Calculation (min)  39 min    Activity Tolerance  Patient limited by lethargy;Patient limited by fatigue    Behavior During Therapy  Flat affect       Past Medical History:  Diagnosis Date  .  OSA (obstructive sleep apnea)    npsg 2012:  AHI 67/hr. Auto titration 2012:  Optimal pressure 12cm.   . APPENDECTOMY, HX OF 02/18/2008   Qualifier: Diagnosis of  By: [place], [place]    . Cardiac murmur    as a child  . Chronic idiopathic constipation   . Depression   . Headache(784.0)   . HEADACHES, HX OF 02/18/2008   Qualifier: Diagnosis of  By: Danny Lawless CMA, [place]    . Parkinson disease ([place]) 11/2014  . Sinus mucosal thickening    pt unable to lay flat  . Streptococcal meningitis    as an infant    Past Surgical History:  Procedure Laterality Date  . APPENDECTOMY  1967  . COLONOSCOPY    . NASAL SINUS SURGERY     x 4 as a child  . VASECTOMY      There were no vitals filed for this visit.  Subjective Assessment - 08/14/18 0940    Subjective  Feel stiff, weak, tired -this is the first week that I feel like Parkinson's might kill me.  Have had to have more help in Boxing class than I've ever had before.   I have been doing well, until about 8-9 days ago.  I've stopped doing my recumbent bike.    Pertinent History  depression, Parkinson's disease    Patient Stated Goals  Pt's goals are to help with getting my foot better (explained this is a new injury and he would need to see MD specifically about that).  Also says to help with stiffness and shuffling.    Currently in Pain?  No/denies                       [facility] Adult PT Treatment/Exercise - 08/14/18 0001      Ambulation/Gait   Ambulation/Gait  Yes    Ambulation/Gait Assistance  5: Supervision    Ambulation/Gait Assistance Details  Pt brought in his own cane today.  Adjusted to correct height for patient.    Ambulation Distance (Feet)  320 Feet   60 ft x 2   Assistive device  Straight cane    Gait Pattern  Step-through pattern;Decreased arm swing - right;Decreased arm swing - left;Decreased step length - right;Decreased step length - left;Trunk flexed;Narrow base of support;Poor foot  clearance - left;Poor foot clearance - right    Ambulation Surface  Level;Indoor    Gait Comments  Cues for upright posture, for looking ahead to improve posture with gait.  Initial cues for proper sequence with cane.  Practiced fully turning around and sitting, continueing to use cane (versus pt trying to walk to "put cane away", then walking to turn and sit).  Decreased festinating episodes noted with turns today.      Exercises   Exercises  Knee/Hip      Knee/Hip Exercises: Aerobic   Stepper  SciFit Seated Stepper, Level 2, 4 extremities x 8 minutes, for warm-up, cues for increased intensity to maintain RPM >80-90      Knee/Hip Exercises: Seated   Other Seated Knee/Hip Exercises  Sit<>stand holding weighted ball, with UE lifts x 10 reps, then sit<>stand with trunk rotation/neck rotation x 5 reps.    Other Seated Knee/Hip Exercises  Seated with upright posture with [employer]ers x 15 reps for improved posture and trunk/postural strengthening     Sit to Sand  10 reps;with UE support   cues for upright posture         Self Care:  Discussed prioritizing therapy activities versus [employer], as pt reports after his 3 therapies today, he plans to go to boxing class at 12:00.  Discussed given his recent increase in fatigue, that prioritizing therapy on therapy days would be more ideal and save boxing for non-therapy days.   PT Education - 08/14/18 1303    Education Details  Verbally requested pt add recumbent bike back into his routine for home (10 minutes, 2x/day); encouraged patient to talk with neurologist about new/worsening PD symptoms (recent increase in fatigue, lethargy)    Person(s) Educated  Patient    Methods  Explanation;Verbal cues    Comprehension  Verbalized understanding       PT Short Term Goals - 07/10/18 2155      PT SHORT TERM GOAL #1   Title  Pt will be independent with HEP to target Parkinson's specific deficits.  TARGET for all STGs 08/31/18 (due to delayed start for visits after eval)    Time  4    Period  Weeks    Status  New      PT SHORT TERM GOAL #2   Title  Pt will improve TUG score to less than or equal to 15 seconds for decreased fall risk.    Time  4    Period  Weeks    Status  New      PT SHORT TERM GOAL #3   Title  Pt will improve DGI score to at least 15/24 for decreased fall risk.    Time  4    Period  Weeks    Status  New      PT SHORT TERM GOAL #4   Title  Pt will report at least 50% improvement in car transfers.    Time  4    Period  Weeks    Status  New      PT SHORT TERM GOAL #5   Title  Pt will verbalize understanding of fall prevention in home environment.    Time  4    Period  Weeks    Status  New        PT Long Term Goals - 07/10/18 2159      PT LONG TERM GOAL #1   Title  Pt/wife will verbalize understanding of tips to reduce  freezing with gait.  UPDATED TARGET for all LTGs 09/28/18    Time  8    Period  Weeks    Status  New      PT LONG TERM GOAL  #2   Title  Pt will improve gait velocity score to at least 2 ft/sec for improved gait efficiency and safety.    Time  8    Period  Weeks    Status  New      PT LONG TERM GOAL #3   Title  Pt will improve DGI score to at least 19/24 for decreased fall risk.    Time  8    Period  Weeks    Status  New      PT LONG TERM GOAL #4   Title  Pt will improve TUG cognitive score to less than or equal to 20 seconds for decreased fall risk.    Time  8    Period  Weeks    Status  New            Plan - 08/14/18 1305    Clinical Impression Statement  Initiated session today with seated aerobic activity for warm-up and for increased intensity in preparation for gait, turning practice with cane.  Also focused on functional strengthening activities.  PT encouraged pt to follow-up with neurologist, as pt feels his fatigue and stiffness is worse in the past several weeks.  Pt tolerates treatment session well, but after PT in OT session, pt c/o dizziness.    Rehab Potential  Good    Clinical Impairments Affecting Rehab Potential  Memory deficits per speech eval notes    PT Frequency  2x / week    PT Duration  8 weeks   plus eval   PT Treatment/Interventions  ADLs/Self Care Home Management;DME Instruction;Balance training;Therapeutic exercise;Therapeutic activities;Functional mobility training;Gait training;Neuromuscular re-education;Patient/family education    PT Next Visit Plan  Continue to train in turning, tips to reduce freezing with turns (have not yet found the best method for reduction in freezing); review HEP and update as needed.  Gait training with focus on using cane.    Consulted and Agree with Plan of Care  Patient       Patient will benefit from skilled therapeutic intervention in order to improve the following deficits and impairments:  Abnormal gait, Decreased balance, Decreased mobility, Decreased coordination, Decreased strength, Difficulty walking, Impaired flexibility, Postural  dysfunction  Visit Diagnosis: Other abnormalities of gait and mobility  Other symptoms and signs involving the nervous system     Problem List Patient Active Problem List   Diagnosis Date Noted  . Left foot pain 07/26/2018  . TMJ (temporomandibular joint syndrome) 10/10/2017  . Parkinson's disease ([place]) 03/20/2017  . Bilateral lower extremity edema 03/20/2017  . Chronic venous insufficiency 09/13/2016  . Fatigue 03/15/2016  . Parkinsonian features 09/15/2015  . ED (erectile dysfunction) 09/15/2015  . MDD (major depressive disorder), recurrent episode, severe ([place]) 02/10/2014  . Nonspecific abnormal electrocardiogram (ECG) (EKG) 02/07/2014  . Non-compliant behavior 02/03/2014  . Morton's neuroma of left foot 08/07/2013  . Attention deficit disorder without mention of hyperactivity 03/13/2012  . Hyperglycemia 01/26/2012  . Hypogonadism male 01/24/2012  . Syncope and collapse 01/20/2012  .  OSA (obstructive sleep apnea) 01/17/2011  . CHEST TIGHTNESS 02/18/2008    Joseline Mccampbell W. 08/14/2018, 1:08 PM  Frazier Butt., PT   [facility] [address] [place], [place], [address] Phone: [phone]   Fax:  [phone]  Name: BEATRIZ SETTLES MRN: [id number] Date of Birth: 1950/11/29

## 2018-08-15 NOTE — Progress Notes (Signed)
[name] [name] was seen today in the movement disorders clinic for neurologic consultation at the request of [name], MD.  The consultation is for the evaluation of slowness, flat affect and to r/o a neurologic disorder.  This patient is accompanied in the office by his spouse who supplements the history.  The first symptom(s) the patient noticed was a feeling of weakness that has been going on for about a year.  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  For about a year and a half he has noted a "body" tremor and his wife has noted a hand tremor in both hands (at rest).  He is stiff like he is "a 67 year old man."  Once he is in bed he finds it is hard to move.  The patient has a hx of significant depression and is on multiple medications related to this.  He was started on cogentin just yesterday, but is also on celexa, wellbutrin and latuda.  He has been on [place] for 1.5 months.  Prior to that he was on abilify (tried on 2 separate times but most recently was only on it for a day) and geodon(doesn't remember this but looks like it may have been given in the past).  He has been on risperdal but cannot remember how long ago.  ECT was offered but his wife does not want him to proceed with that  11/20/14 update:  The patient returns today for follow-up.  He is accompanied by his wife who supplements the history.  He has been off of [employer] for about 2 weeks.  Unfortunately, he was unable to afford the dat scan.  He is on just Wellbutrin and Celexa for depression and his Cogentin and has been discontinued.  He has noted a decreased appetite and dry mouth and having trouble sleeping because of stiffness.  Pt states that he is almost afraid to go to sleep.  Missing 2 days of work per week (works nights) but a lot of that because of anxiety.  His wife notices increased tremor.  He has slow at work.  Admits to some  lightheadedness but no syncope.  Note diplopia.  No hallucinations.  His wife is doing most of the driving because he is having difficulty staying in his own lane.  02/19/15 update:  The patient is following up today regarding his parkinsonism.  I reviewed records from his psychiatrist since last visit.  His psychiatry note from 12/04/14 indicates that the patient stated that tremor was better.  However, I received a call 4 days later stating that tremor was worse and he thought it was from the Mirapex.  He wanted to change the Mirapex to something else.  He was on Mirapex 0.5 mg 3 times per day at the time.  We ended up switching it to Requip 1 mg 3 times a day.  Fortunately he is doing better.  He denies any side effects with the Requip.  He currently takes it at 8 AM/2 PM/8 PM.  He does state that he notices that he "shuffles with the left leg."  He has not fallen but sometimes feels off balance.  He states that he finished his physical/occupational/speech therapy and feels that it went well.  He is not exercising on his own.  He initially states that he does not feel that he has time, but also states that he does not  go into work until 2 PM and works until 10:30 PM.  This is a change for him.  He was able to change his job at [employer] and seems like that is doing better.  His wife mentions that he continues to have memory loss that she thinks has increased, but she also states that she has noticed memory loss ever since starting on antidepressants.  He had one fall since last visit.  This was when he was taking the trash to the street.  He had no fractures with this, just a superficial scrape.    05/25/15 update:  The patient presents today, accompanied by his wife who supplements the history.  His Requip was increased last visit so that was taking 2 tablets in the morning, one in afternoon and 2 in the evening and 1 day after I started this, I received a call from his wife that the patient did not  tolerate it and it made symptoms worse (which is what they reported with Mirapex).  While that was very odd to me, I told them to go back down to 1 mg 3 times per day.  He actually comes back on 1 mg, 1 in the AM, 2 in the afternoon and 1 in the evening.  He reports that he is doing worse.  He has more tremor and he feels tired in the evening.  Pts wife asks about trying to go back up on the requip slowly.   He had neuropsych testing in June at [facility].  I reviewed that.  There was no evidence of dementia.  There was evidence of mild cognitive impairment from Parkinson's disease as well as from chronic major depressive disorder.  It is recommended that the patient consider transcranial magnetic stimulation for depression, and he told the neuropsychologist that he considered in the past and opted against it.  She encouraged him to attend a Parkinson's support group and he told her that he tried that once and it was not helpful.  He still c/o "my memory has been terrible and especially over the last few weeks."  He feels that he is not depressed - "that is well controlled."  He is not doing CV exercise.  He is still in his new job but it seems more physical than his old job and he thinks he is not as fast in getting tasks done.  He asks about DBS.  08/25/15 update:  The patient is following up today, accompanied by his wife who supplements the history.  I have reviewed records since our last visit.  He is on Requip XL, which was increased to 6 mg at night last visit.  He changed that back to 2 mg tid of the regular ropinirole because of cost.   He is enrolled in physical therapy.  "I'm doing bad."  When asked to describe how he feels bad, he has trouble describing it.  He had a root canal and hasn't felt well since.  He hasn't been back to work for 4 weeks.  He feels that his speech is low and states that he went to voice therapy 2 weeks ago.  He isn't practicing the voice therapy faithfully.  He is  riding the bike on the highest resistance 20 mins a day.  He is having word finding trouble.   No falls but feels that he is "weaker."  He asks me about going to "light duty" at work and possibly retirement in the near future.  He  continues to see psychiatry for his depression.  He last saw psychiatry on 06/04/2015.  No changes were made in medication as he stated that mood was good and continues to reaffirm that.    11/30/15 update:  The patient follows up today, accompanied by his wife who supplements the history.  Last visit, his ropinirole was increased to 4 mg in the morning (8am but goes back to bed for another 1 hour or so), another 2 mg in the afternoon (1-2 pm) and evening (8-8:30pm).  I changed him to light duty at work last visit at his request.  He called me recently and wanted changed from being able to work 4 hours per day to a total of only 10 hours per week.  I told him that he would need a functional capacity evaluation for this, as I was not sure that this made sense from a Parkinson standpoint, given his stage of Parkinson's disease.  He was fairly frustrated with this recommendation, but I told him that I needed some objective evidence.  He reports that he is awaiting an appt for the FCE (has called).   He has been seeing psychiatry and I  actually received a call from his psychiatrist back in December that his psychiatrist felt that he needed to be on levodopa.  I did not think that was the case, but I was willing to do a levodopa challenge to see if that was the case.  He was scheduled to have that done, but the patient canceled that.  Does report that he thinks that depression under good control.   He denies falls.  Some lightheadedness when first gets up, but no near syncope.  Using bike daily for 15-20 minutes.  No hallucinations; rare visual distortions.  Wife thinks that he seems more stiff and has involuntary "tics and shaking" at night.  Waking up with AM headache.  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  03/29/16 update:  The patient follows up today, accompanied by his wife who supplements the history.  Last visit, his ropinirole was increased to 4 mg in the morning, 2 mg in the afternoon and 2 mg in the evening.  Has intermittent spells of increased stiffness, slower on stairs.  Riding recumbant bike at home.  He has been seeing psychiatry and is still on celexa and wellbutrin.  I reviewed last records with Dr. Adele Schilder and pt continued to c/o memory issues (long standing c/o) and was told to discuss with me as may be PD related.  Pt had neuropsych testing done about a year ago and demonstrated no neurodegenerative memory change related to parkinsons but did feel that depression was an issue affecting cognition.  Thinks that memory is worse. No falls since last visit.  He has attended PT/OT/ST from March-May.  He c/o drooling.  Also c/o talking at night and some tremor at night.  Happens most night.  Also c/o snoring and wife thinks that is due to weight and trying to cut back on sugars.  Has OSAS but doesn't use the CPAP.  Has some visual distortions but no hallucinations.  Can be frightening.  Having some urinary incontinence.  Saw urology in the past but not for incontinence.    05/30/16 update:  The patient follows up today, accompanied by his wife who supplements the history.  He is on ropinirole, 4 mg in the morning, 2 mg in the afternoon and 2 mg in the evening.  He denies compulsive behaviors.  He denies sleep attacks.  He is now attending the ACT gym and feels he is getting benefit from this.  He is getting scholarship information.  He had repeat neuropsych testing on August 2 with a follow-up with Dr. Si Raider on August 10.  This was largely consistent with his performance one year prior.  There is mild cognitive impairment consistent with Parkinson's disease and untreated obstructive sleep apnea syndrome.  There was evidence of major depressive disorder,  and Dr. Si Raider felt that there was long-standing personality issues that influenced the patient's view of the world in a negative fashion.  He saw urology since last visit and I did get a note from them.  It was not clear what medication he was tried on, just stated that he tried a beta 3 agonist (?  Myrbetriq).  Pt states that it was in fact myrbetriq that was added.  Pt states that it hasn't helped but wife thinks that he isn't getting up as much during the night.  He was getting up 3 times during the night and now he may get up 1-2 times during the night.  Having some dizziness when gets up for about 2 min.  Drinking 2 L of water a day.  Given up sugar completely.  I did order a split night PSG last time but results are not available.  He c/o increased drooling.  09/14/16 update:  Patient follows up today, accompanied by his wife who supplements the history.  He is on ropinirole, 4 mg in the morning, 2 mg in the afternoon and 2 mg in the evening.  Pt had one fall on the ice when it snowed but that was it.  Pt denies lightheadedness, near syncope.  No hallucinations.  Mood has been good.  He remains on a combination of Wellbutrin and Celexa for depression and thinks that this has been well controlled.  He saw psychiatry since our last visit.  He was referred to psychology for counseling.  He had a functional capacity evaluation on 09/01/2016.  Stated that the patient could exert up to 20 pounds of force occasionally and up to 10 pounds of force frequently, but it also stated that physical demand requirements are in excess of those for sedentary work.  It stated that any job that the patient should engage in should be rated "light work" when it requires walking or standing to a significant degree, when it requires sitting most of the time but entails pushing and/or pulling of leg or arm controls and/or when the job requires working at a production rate paced entailing constant pushing and/or pulling a materials  even if the weight of his materials is negligible.  It did state that based on the evaluation, the client was capable of sustaining light level of work for an 8 hour per day, 40 hour per week job.  Asks me about drooling.  Does not want to try botox.  Asks me about cough and swollen ankles.  C/o talking in sleep.  Will waken him out sleep.  Having trouble getting in and out of the cars (not so much his truck)..  Hits his head on the door jam.  Started PT last week.  Still very sleepy during the day.    12/06/16 update:  Patient has up today, accompanied by his wife who supplements the history.  Patient remains on ropinirole, 4 mg in the morning, 2 mg in the afternoon and 2 mg in the evening. Having  some illusions but once gets close on the object he will realize what it is.  Wife notes some increased tremor but patient doesn't.   He has 2 falls since our last visit.  One he tripped over a curb and the other he fell down stairs but he was close to the bottom and didn't get hurt.  He is working out and doing exercises.  He has issues with drooling, but does not want to pursue Myobloc, nor does he want Robinul.  He did attend rehabilitation since our last visit and I reviewed those records.  Still going to ACT bid.  Feels like speech is slurring.   04/10/17 update:  Patient seen today in follow-up, accompanied by his wife who supplements the history.  Patient is on ropinirole, 4 mg in the morning, 2 mg in the afternoon and 2 mg in the evening.  C/o ankle swelling.  Wondered if it was the [place].  I wanted to start him on carbidopa/levodopa 25/100, one tablet 3 times per day for last visit but he decided to hold on that. Interestingly, he saw Dr. Adele Schilder in June and I reviewed those records.  The patient had reported to him that he felt more stiff and tremulous and according to notes the psychiatrist told him that he thought he needed levodopa.   I was contacted by physical therapy that he was orthostatic in therapy.  He  came to our office for orthostatics and was not orthostatic that day.  We did give him a prescription for the abdominal compression Binder.  He isn't using that and didn't fill the RX.  We told him to increase hydration.  He is drinking 2 liters per day.  Is starting RSB today in [place].    07/17/17 update:  Patient seen today in follow-up for parkinsonism.  He is accompanied by his wife who supplements the history.  He is on ropinirole, 4 mg in the morning, 2 mg in the afternoon and 2 mg in the evening.  Last visit, we started carbidopa/levodopa 25/100, one tablet 3 times per day.  He has attended rehabilitation therapy since our last visit and those notes are reviewed.   He is still going to ACT gym and RSB.  Despite that, he feels weak and doesn't feel as strong as he was before last visit.  He doesn't describe it as dizziness.  He states that he doesn't have an opinion on efficacy of levodopa but wife thinks that it has been beneficial for tremor.  He has a fear of falling and that will slow his movement.  He has not had any actual falls.  Noted swelling of feet and ankles since august.  Some trouble with bladder incontinence and noting early issues with bowels as well.  States that his memory has gotten worse and forgot to take his medication this AM.   He continues to follow with psychiatry.  He is on Wellbutrin and Celexa.  11/13/17 update: Patient is seen today in follow-up.  He is accompanied by his wife who supplements the history.  Records made available to me have been reviewed.  We decreased his ropinirole last visit to 2 mg 3 times per day.  He remains on carbidopa/levodopa 25/100, 1 tablet 3 times per day.  He is still very active with rock steady boxing and ACT.  Pt states that he feels like the "Parkinsons is winning."   His wife brings a very long list of things she would like to discussed, one  of which is the fact that balance and movement seem to be more difficult, despite the exercise.  In  addition, stairs are getting more difficult.  He was evaluated by the rehab center November 08, 2017 and felt that it would be beneficial for him to restart therapy.  No falls, but he has a fearful reveals, shuffling more and he has swollen ankles, per wife.  Wife also complains that the patient has several episodes of nocturia (2-3 times per night).  He has seen his psychiatrist since last visit.  He also came in with his wife and met with our Education officer, museum.  He admitted to her that he did have increasing depression (not admitted to psychiatry per notes).  He wanted referral to a new counselor, because of insurance change she could not see his previous one.  He was given names.  He has started to see Pervis Hocking  04/16/18 update: Patient is seen today in follow-up for parkinsonism.  He is accompanied by his wife who supplements the history.  Records have been reviewed since last visit.  He also brings in a long list of c/o (16 items on list).  He is on ropinirole, 2 mg 3 times per day.  Discussed last visit changing the timing of his carbidopa/levodopa 25/100 to 7 AM/11 AM/4 PM.  Reports today that he is following this schedule.  Pt denies falls.  Has tripped and caught himself.  Legs feel heavy.  Feels like he is freezing at times.  Pt denies lightheadedness, near syncope.  States that he has hallucinations but they are visual distortions when described.   He c/o excessive drooling.  seeing Pervis Hocking.  Last saw psychiatry on March 19, 2018 and those records are reviewed.  Has attended rehab therapy since last visit and those notes are reviewed.  He c/o speech trouble.  He c/o magnetic gait.  He c/o EDS - states that last with PSG several years ago.  First time, he had evidence of OSAS and second time he did not.  He isn't doing speech exercises at home but is doing other exercises.  Continues to complain about memory change, which has been a long-term concern.     08/16/18 update: Patient seen today in  follow-up for parkinsonism, accompanied by his wife who supplements history.  Patient is on ropinirole, which was decreased last visit to 1 mg 3 x per day due to complaints of EDS and memory change.  I increased his carbidopa/levodopa 25/100 to 2at 7am/2 at 11am/1at 4pm.  States that he has been feeling so bad that he had to come early.   No hallucination.  No tremor.  C/o dry mouth.  C/o "incredible weakness."  Wife states that "he is freezing all of the time."  He is doing RSB 3 days per week but less so since in PT.  He is doing ACT 2 days per week.  He is currently in neurorehab.    Neuroimaging has previously been performed.  It is available for my review today and I reviewed it with him.  There is no BG disease.  ALLERGIES:  No Known Allergies  CURRENT MEDICATIONS:  Outpatient Encounter Medications as of 08/16/2018  Medication Sig  . B Complex-C (B-COMPLEX WITH VITAMIN C) tablet Take 1 tablet by mouth daily.  Marland Kitchen buPROPion (WELLBUTRIN XL) 300 MG 24 hr tablet TAKE 1 TABLET BY MOUTH EVERY DAY IN THE MORNING  . carbidopa-levodopa (SINEMET IR) 25-100 MG tablet 2 in the morning, 2 in  the afternoon, and 1 in the evening  . citalopram (CELEXA) 40 MG tablet Take 1 tablet (40 mg total) by mouth daily.  . fesoterodine (TOVIAZ) 4 MG TB24 tablet Take 1 tablet (4 mg total) by mouth daily.  Marland Kitchen LINZESS 290 MCG CAPS capsule TAKE 1 CAPSULE BY MOUTH DAILY BEFORE BREAKFAST.  Marland Kitchen Omega 3-6-9 Fatty Acids (OMEGA 3-6-9 PO) Take by mouth.  Marland Kitchen rOPINIRole (REQUIP) 1 MG tablet Take 1 tablet (1 mg total) by mouth 3 (three) times daily.  . TURMERIC CURCUMIN PO Take by mouth.  . sildenafil (VIAGRA) 100 MG tablet Take 0.5-1 tablets (50-100 mg total) by mouth daily as needed for erectile dysfunction. (Patient not taking: Reported on 08/16/2018)   No facility-administered encounter medications on file as of 08/16/2018.     PAST MEDICAL HISTORY:   Past Medical History:  Diagnosis Date  .  OSA (obstructive sleep apnea)     npsg 2012:  AHI 67/hr. Auto titration 2012:  Optimal pressure 12cm.   . APPENDECTOMY, HX OF 02/18/2008   Qualifier: Diagnosis of  By: Vici, [place]    . Cardiac murmur    as a child  . Chronic idiopathic constipation   . Depression   . Headache(784.0)   . HEADACHES, HX OF 02/18/2008   Qualifier: Diagnosis of  By: Danny Lawless CMA, [place]    . Parkinson disease ([place]) 11/2014  . Sinus mucosal thickening    pt unable to lay flat  . Streptococcal meningitis    as an infant    PAST SURGICAL HISTORY:   Past Surgical History:  Procedure Laterality Date  . APPENDECTOMY  1967  . COLONOSCOPY    . NASAL SINUS SURGERY     x 4 as a child  . VASECTOMY      SOCIAL HISTORY:   Social History   Socioeconomic History  . Marital status: Married    Spouse name: Not on file  . Number of children: Y  . Years of education: Not on file  . Highest education level: Not on file  Occupational History  . 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 Financial resource strain: Not on file  . Food insecurity:    Worry: Not on file    Inability: Not on file  . Transportation needs:    Medical: Not on file    Non-medical: Not on file  Tobacco Use  . Smoking status: Never Smoker  . Smokeless tobacco: Never Used  Substance and Sexual Activity  . Alcohol use: No    Alcohol/week: 0.0 standard drinks  . Drug use: No  . Sexual activity: Yes    Birth control/protection: None  Lifestyle  . Physical activity:    Days per week: Not on file    Minutes per session: Not on file  . Stress: Not on file  Relationships  . Social connections:    Talks on phone: Not on file    Gets together: Not on file    Attends religious service: Not on file    Active member of club or organization: Not on file    Attends meetings of clubs or organizations: Not on file    Relationship status: Not on file  . Intimate partner violence:    Fear of current or ex partner: Not  on file    Emotionally abused: Not on file    Physically abused: Not on file    Forced sexual  activity: Not on file  Other Topics Concern  . Not on file  Social History Narrative   Wife-care partner     FAMILY HISTORY:   Family Status  Relation Name Status  . Father  Deceased       lung cancer, alzheimer's  . Mother  Alive       HTN, A fib  . Brother  Deceased       accident  . Sister  Alive       healthy  . Son  Alive       healthy  . Daughter  Alive       healthy  . Daughter  Alive       healthy  . Mat Aunt  Alive       Parkinson's Disease  . Neg Hx  (Not Specified)    ROS: Review of Systems  Constitutional: Negative.   HENT:       Dry mouth   Eyes: Negative.   Respiratory: Negative.   Cardiovascular: Negative.   Gastrointestinal: Negative.   Genitourinary: Negative.   Musculoskeletal: Negative.   Skin: Negative.      PHYSICAL EXAMINATION:    VITALS:   Vitals:   08/16/18 1308  BP: 120/80  Pulse: 81  SpO2: 94%  Weight: (!) 305 lb 6 oz (138.5 kg)  Height: '6\' 2"'$  (1.88 m)   Wt Readings from Last 3 Encounters:  08/16/18 (!) 305 lb 6 oz (138.5 kg)  07/26/18 (!) 305 lb (138.3 kg)  04/16/18 (!) 302 lb (137 kg)     GEN:  The patient appears stated age and is in NAD. HEENT:  Normocephalic, atraumatic.  The mucous membranes are moist. The superficial temporal arteries are without ropiness or tenderness. CV:  RRR Lungs:  CTAB Neck/HEME:  There are no carotid bruits bilaterally. MS:  Has minor LE edema  Neurological examination:  Orientation: The patient is alert and oriented x3.  Cranial nerves: There is good facial symmetry. 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   The speech is fluent and clear.  He is hypophonic.  The patient is able to make the gutteral sounds without difficulty. Soft palate rises symmetrically and there is no tongue deviation. Hearing is intact to conversational tone. Sensation: Sensation is intact to light touch  throughout Motor: Strength is at least antigravity today  Movement examination: Tone: There is mild increased tone in the RUE  Abnormal movements: There is no tremor today Coordination:  There is no decremation, with any form of RAMS, including alternating supination and pronation of the forearm, hand opening and closing, finger taps, heel taps and toe taps. Gait and Station: The patient gets out of the chair without the use of his hands.  He is slow, but walks fairly well down the hall.  Labs:    Chemistry      Component Value Date/Time   NA 138 10/09/2017 1138   K 4.3 10/09/2017 1138   CL 104 10/09/2017 1138   CO2 28 10/09/2017 1138   BUN 16 10/09/2017 1138   CREATININE 1.14 10/09/2017 1138      Component Value Date/Time   CALCIUM 9.3 10/09/2017 1138   ALKPHOS 66 10/09/2017 1138   AST 13 10/09/2017 1138   ALT 21 10/09/2017 1138   BILITOT 0.6 10/09/2017 1138     Lab Results  Component Value Date   HGBA1C 6.1 10/09/2017      ASSESSMENT/PLAN:  1.  Parkinsonism  - continue requip 1 mg tid.  Higher  dosage with memory change and EDS  -continue carbidopa/levodopa 25/100 2/2/1  -add comtan 200 mg tid to each dose of levodopa  -asked about focused u/s and we addressed that vs DBS today in detail.  -asks about xadago.  Do not think would be impactful at this point.  -Long talk with the patient about the fact that I do not think he is doing as bad as he thinks he is doing.  He has a long Economist of complaints each visit, but I really do not see any significant deterioration from visit to visit. 2  Depression  -Despite denial, I continue to think that depression and mood drive his feelings of not doing well physically. 3.  Sialorrhea  -This is commonly associated with PD.  We talked about treatments.  The patient is not a candidate for oral anticholinergic therapy because of increased risk of confusion and falls.  We discussed Botox (type A and B) and 1% atropine drops.  We  discusssed that candy like lemon drops can help by stimulating mm of the oropharynx to induce swallowing.  He doesn't want to pursue these options 4.  Dizziness and occasional Orthostatic hypotension  -drinking plenty of fluid and doing well with that.  -has abdominal compression binder but isn't using.  5.  Memory loss  -Patient has twice had neurocognitive testing, the last of which was in December 2017.  I have not seen any significant cognitive decline, despite continued complaints.  Last testing Dr. Si Raider felt that some of the issues related to the patient's personality of seeing the world in a negative light.  I tend to agree with this.   6.  Urinary incontinence, frequency and nocturia  -Wife asked me about this again today.  We have discussed this at previous visits.  He is already following with urology at [facility] but admits that hasn't been there in a long time (about a year).  Told to make a follow appointment.  She asks about leg swelling.  Urology had previously stopped the Toviaz to see if it would help the swelling, but his bladder function got worse and it was restarted.   7.  EDS  -Patient had nocturnal polysomnogram in August, 2017 with AHI 5.6.  Doing somewhat better with changes in Requip. 8.  dry mouth  -told him could be due to [place] and needs to discuss with pcp.    -discussed biotene with patient. 9.  Follow-up in the next 4 to 5 months, sooner should new neurologic issues arise.  Much greater than 50% of this visit was spent in counseling and coordinating care.  Total face to face time:  35 min

## 2018-08-16 ENCOUNTER — Encounter: Payer: Self-pay | Admitting: Neurology

## 2018-08-16 ENCOUNTER — Encounter: Payer: Self-pay | Admitting: Speech Pathology

## 2018-08-16 ENCOUNTER — Ambulatory Visit: Payer: Medicare Other | Admitting: Occupational Therapy

## 2018-08-16 ENCOUNTER — Ambulatory Visit: Payer: Medicare Other | Admitting: Physical Therapy

## 2018-08-16 ENCOUNTER — Ambulatory Visit: Payer: Medicare Other | Admitting: Speech Pathology

## 2018-08-16 ENCOUNTER — Encounter: Payer: Self-pay | Admitting: Physical Therapy

## 2018-08-16 ENCOUNTER — Ambulatory Visit (INDEPENDENT_AMBULATORY_CARE_PROVIDER_SITE_OTHER): Payer: Medicare Other | Admitting: Neurology

## 2018-08-16 VITALS — BP 120/80 | HR 81 | Ht 74.0 in | Wt 305.4 lb

## 2018-08-16 DIAGNOSIS — R2681 Unsteadiness on feet: Secondary | ICD-10-CM

## 2018-08-16 DIAGNOSIS — F331 Major depressive disorder, recurrent, moderate: Secondary | ICD-10-CM | POA: Diagnosis not present

## 2018-08-16 DIAGNOSIS — R2689 Other abnormalities of gait and mobility: Secondary | ICD-10-CM | POA: Diagnosis not present

## 2018-08-16 DIAGNOSIS — R29898 Other symptoms and signs involving the musculoskeletal system: Secondary | ICD-10-CM | POA: Diagnosis not present

## 2018-08-16 DIAGNOSIS — R278 Other lack of coordination: Secondary | ICD-10-CM

## 2018-08-16 DIAGNOSIS — R29818 Other symptoms and signs involving the nervous system: Secondary | ICD-10-CM | POA: Diagnosis not present

## 2018-08-16 DIAGNOSIS — G20 Parkinson's disease: Secondary | ICD-10-CM | POA: Diagnosis not present

## 2018-08-16 DIAGNOSIS — R293 Abnormal posture: Secondary | ICD-10-CM

## 2018-08-16 DIAGNOSIS — R41841 Cognitive communication deficit: Secondary | ICD-10-CM

## 2018-08-16 DIAGNOSIS — R41840 Attention and concentration deficit: Secondary | ICD-10-CM | POA: Diagnosis not present

## 2018-08-16 DIAGNOSIS — R471 Dysarthria and anarthria: Secondary | ICD-10-CM

## 2018-08-16 MED ORDER — ENTACAPONE 200 MG PO TABS: 200.0000 mg | ORAL_TABLET | Freq: Three times a day (TID) | ORAL | 5 refills | Status: DC

## 2018-08-16 NOTE — Therapy (Signed)
[facility] [address] [place], [place], [address] Phone: [phone]   Fax:  [phone]  Speech Language Pathology Treatment  Patient Details  Name: [name] [name] MRN: [id number] Date of Birth: November 11, 1950 Referring Provider (SLP): Alonza Bogus, DO   Encounter Date: 08/16/2018  End of Session - 08/16/18 1132    Visit Number  6    Number of Visits  13    Date for SLP Re-Evaluation  08/24/18    SLP Start Time  0852    SLP Stop Time   0932    SLP Time Calculation (min)  40 min    Activity Tolerance  Patient tolerated treatment well       Past Medical History:  Diagnosis Date  .  OSA (obstructive sleep apnea)    npsg 2012:  AHI 67/hr. Auto titration 2012:  Optimal pressure 12cm.   . APPENDECTOMY, HX OF 02/18/2008   Qualifier: Diagnosis of  By: [place], [place]    . Cardiac murmur    as a child  . Chronic idiopathic constipation   . Depression   . Headache(784.0)   . HEADACHES, HX OF 02/18/2008   Qualifier: Diagnosis of  By: Danny Lawless CMA, [place]    . Parkinson disease ([place]) 11/2014  . Sinus mucosal thickening    pt unable to lay flat  . Streptococcal meningitis    as an infant    Past Surgical History:  Procedure Laterality Date  . APPENDECTOMY  1967  . COLONOSCOPY    . NASAL SINUS SURGERY     x 4 as a child  . VASECTOMY      There were no vitals filed for this visit.  Subjective Assessment - 08/16/18 0903    Subjective  "My life shouldn't be this complicated    Currently in Pain?  No/denies            ADULT SLP TREATMENT - 08/16/18 0904      General Information   Behavior/Cognition  Alert;Cooperative;Pleasant mood      Treatment Provided   Treatment provided  Cognitive-Linquistic      Cognitive-Linquistic Treatment   Treatment focused on  Dysarthria    Skilled Treatment  Pt enters ST room with hoarse voice - Loud "Hey [facility]!" to improve vocal quality. Pt continues to utilize  calendar at home for orientation and schedule/appointment management. He is writing down questions for MD appointment today and using mini notebook to write down topics he would like to discuss and reminders with usual min verbal cues. Pt independently demonstrated divided attention solving jumble in the paper while conversing with me.       Assessment / Recommendations / Plan   Plan  Continue with current plan of care      Progression Toward Goals   Progression toward goals  Progressing toward goals       SLP Education - 08/16/18 1132    Education provided  Yes    Education Details  biotene spray and gel for dry mouth - pt independently wrote this in his mini note book    Person(s) Educated  Patient    Methods  Explanation;Demonstration    Comprehension  Verbalized understanding       SLP Short Term Goals - 08/16/18 1131      SLP SHORT TERM GOAL #1   Title  Pt will carryover 2 compensations for attention/memory at home to complete daily chores/tasks with occasional min A over 2 sessions  Baseline  08/14/18; 08/16/18    Time  1    Period  Weeks    Status  Achieved      SLP SHORT TERM GOAL #2   Title  Pt will report carryover of compensations for slow processing in coversations (comprehension and expression) with occasional min A over 2 sessions    Time  1    Period  Weeks    Status  On-going      SLP SHORT TERM GOAL #3   Title  Pt will utilize compensations for dysarthria in structured tasks with occasional min A in three sessions    Baseline  08/14/18; 08/16/18    Time  21    Period  Weeks    Status  On-going      SLP SHORT TERM GOAL #4   Title  Formal cognitive linguistic assessment as indicated    Time  2    Period  Weeks    Status  Deferred       SLP Long Term Goals - 08/16/18 1131      SLP LONG TERM GOAL #1   Title  pt will demo attention skills adequate for functional verbal expression in 10 minutes simple-mod complex conversation over two sessions    Time   4    Period  Weeks    Status  On-going      SLP LONG TERM GOAL #2   Title  Pt will be intelligible in 15 minute converation in mildly noisy environment with occasional min A over 2 sessions    Time  4    Period  Weeks    Status  On-going      SLP LONG TERM GOAL #3   Title  Pt will report 25% reduction (subjectively) in requests for repetition or being told he is mumbling by his spouse over 3 sessions    Time  4    Status  On-going      SLP LONG TERM GOAL #4   Title  Pt will reports carryover of environmental compensations for improved intelligibility outside of therapy over 2 sessions    Time  4    Period  Weeks    Status  On-going       Plan - 08/16/18 1127    Clinical Impression Statement  Pt enters with hoarse voice, eliminated with strong volume with usual min A. Pt reports consistent carryover of writing important info down, used his mini notebook today independently and had written questions for Dr. Carles Collet. He is utilizing calendar at home to manage his scheudule. Attention in conversation in quiet environment with minimal distrations is intact, with slow processing. Pt continues to report diffuclty participating in conversations outside of home or ST. He requires ongoing ST to maximize carryover of compensations for slow processing and memory. to improve participation in conversations    Speech Therapy Frequency  2x / week    Duration  --   6 weeks or 13 visits   Treatment/Interventions  Language facilitation;Internal/external aids;Compensatory techniques;SLP instruction and feedback;Multimodal communcation approach;Cognitive reorganization;Functional tasks;Cueing hierarchy;Patient/family education    Potential to Achieve Goals  Good    Potential Considerations  Severity of impairments;Previous level of function;Cooperation/participation level    Consulted and Agree with Plan of Care  Patient       Patient will benefit from skilled therapeutic intervention in order to improve  the following deficits and impairments:   Dysarthria and anarthria  Cognitive communication deficit    Problem List Patient  Active Problem List   Diagnosis Date Noted  . Left foot pain 07/26/2018  . TMJ (temporomandibular joint syndrome) 10/10/2017  . Parkinson's disease ([place]) 03/20/2017  . Bilateral lower extremity edema 03/20/2017  . Chronic venous insufficiency 09/13/2016  . Fatigue 03/15/2016  . Parkinsonian features 09/15/2015  . ED (erectile dysfunction) 09/15/2015  . MDD (major depressive disorder), recurrent episode, severe ([place]) 02/10/2014  . Nonspecific abnormal electrocardiogram (ECG) (EKG) 02/07/2014  . Non-compliant behavior 02/03/2014  . Morton's neuroma of left foot 08/07/2013  . Attention deficit disorder without mention of hyperactivity 03/13/2012  . Hyperglycemia 01/26/2012  . Hypogonadism male 01/24/2012  . Syncope and collapse 01/20/2012  .  OSA (obstructive sleep apnea) 01/17/2011  . CHEST TIGHTNESS 02/18/2008    Nanna Ertle, Annye Rusk MS, CCC-SLP 08/16/2018, 11:33 AM  [facility] [address] [place], [place], [address] Phone: [phone]   Fax:  [phone]   Name: JALAN FARISS MRN: [id number] Date of Birth: 05/28/1951

## 2018-08-16 NOTE — Therapy (Signed)
[facility] [address] [place], [place], [address] Phone: [phone]   Fax:  [phone]  Occupational Therapy Treatment  Patient Details  Name: [name] [name] MRN: [id number] Date of Birth: 11-25-1950 Referring Provider (OT): Dr. Carles Collet   Encounter Date: 08/16/2018  OT End of Session - 08/16/18 1144    Visit Number  6    Number of Visits  17    Date for OT Re-Evaluation  09/08/18    Authorization Type  Medicare and federal BCBS--75 visit limit combined OT, PT, ST for year    Authorization Time Period  anticipate seeing pt for approx 6 weeks -12 visits due to visit limit    Authorization - Visit Number  6    Authorization - Number of Visits  12    OT Start Time  0935    OT Stop Time  1015    OT Time Calculation (min)  40 min    Activity Tolerance  Patient tolerated treatment well    Behavior During Therapy  Flat affect       Past Medical History:  Diagnosis Date  .  OSA (obstructive sleep apnea)    npsg 2012:  AHI 67/hr. Auto titration 2012:  Optimal pressure 12cm.   . APPENDECTOMY, HX OF 02/18/2008   Qualifier: Diagnosis of  By: [place], [place]    . Cardiac murmur    as a child  . Chronic idiopathic constipation   . Depression   . Headache(784.0)   . HEADACHES, HX OF 02/18/2008   Qualifier: Diagnosis of  By: Danny Lawless CMA, [place]    . Parkinson disease ([place]) 11/2014  . Sinus mucosal thickening    pt unable to lay flat  . Streptococcal meningitis    as an infant    Past Surgical History:  Procedure Laterality Date  . APPENDECTOMY  1967  . COLONOSCOPY    . NASAL SINUS SURGERY     x 4 as a child  . VASECTOMY      There were no vitals filed for this visit.  Subjective Assessment - 08/16/18 1142    Subjective   Pt reports he is seeing Dr. Carles Collet due to not felling well and Tuesday and really being out of it.    Pertinent History  Parkinson's disease, seeing illusions, hx of falls, hx of  significant depression, mild cognitive impairment, sleep apnea, hypogonadism, ADD    Patient Stated Goals  improved balance and ease with ADLs    Currently in Pain?  No/denies                 Treatment: Therapist reviewed with pt how to donn/ doff jacket, pt reviewed x 5 reps, mod v.c and demonstration initially, then pt performed with min v.c and improved performance. Fine motor coordination task to copy a small peg design with LUE with a cognitive component, min difficulty, v.c for larger amplitude movements. Dynamic step and reach to flip large playing cards, min v.c for posture and larger amplitude movements.            OT Short Term Goals - 08/16/18 0954      OT SHORT TERM GOAL #1   Title  Pt will be independent with PD specific  HEP.--    Time  4    Period  Weeks    Status  On-going      OT SHORT TERM GOAL #2   Title  Pt will verbalize understanding of ways to  prevent future complications and current available/appropriate community resources prn.    Time  4    Period  Weeks    Status  On-going      OT SHORT TERM GOAL #3   Title  Pt will demonstrate improved fine motor coordination for ADLS as evidenced by decreasing LUE 9 hole peg test score to 47 secs or less.    Baseline  RUE 38.40, LUE 50.07 secs    Time  4    Period  Weeks    Status  On-going      OT SHORT TERM GOAL #4   Title  Pt will report incr ease with donning shirt, pants socks/shoes using adaptive strategies/AE prn.    Time  4    Period  Weeks    Status  On-going        OT Long Term Goals - 07/10/18 1430      OT LONG TERM GOAL #1   Title  Pt will verbalize understanding of adapted strategies for ADLs/IADLs including strategies to minimize freezing episodes    Time  8    Period  Weeks    Status  New      OT LONG TERM GOAL #2   Title  Pt will demonstrate improved ease with donning / doffing jacket as evidenced by decreasing PPT# 4 to 35 secs or less.    Time  8    Period  Weeks     Status  New      OT LONG TERM GOAL #3   Title  ------------------------------      OT LONG TERM GOAL #4   Title  ------------------------------------------      OT LONG TERM GOAL #5   Title  ---------------------            Plan - 08/16/18 1000    Clinical Impression Statement  Pt reports feeling better today. He reports he is going to see Dr. Carles Collet today since he was so "out of it on Tuesday". Pt is progressing towards goals for ADLS.    Occupational Profile and client history currently impacting functional performance  PMH that includes: seeing illusions, hx of falls, hx of significant depression, mild cognitive impairment, sleep apnea, hypogonadism, ADD. Pt reports recent falls and increased difficulty with orienting clothes to donn them.Pt retired due to difficulty at work and no longer drives. Pt reports slowing of ADLs and incr cognitive deficits impacting function.     Occupational performance deficits (Please refer to evaluation for details):  ADL's;IADL's;Leisure;Social Participation    Rehab Potential  Good    Current Impairments/barriers affecting progress:  cognitive deficits, depression, decreased balance, freezing episodes    OT Frequency  2x / week    OT Duration  8 weeks    OT Treatment/Interventions  Self-care/ADL training;Moist Heat;Fluidtherapy;Balance training;Therapeutic activities;Cognitive remediation/compensation;Therapeutic exercise;Ultrasound;Cryotherapy;Neuromuscular education;Visual/perceptual remediation/compensation;Passive range of motion;Functional Mobility Training;Patient/family education;Manual Therapy;Energy conservation;Paraffin;DME and/or AE instruction;Aquatic Therapy    Plan  ADL strategies for donning shirt and LB dressing        Patient will benefit from skilled therapeutic intervention in order to improve the following deficits and impairments:  Decreased cognition, Impaired flexibility, Decreased mobility, Decreased coordination, Decreased  endurance, Decreased range of motion, Decreased strength, Impaired UE functional use, Impaired tone, Impaired perceived functional ability, Decreased safety awareness, Difficulty walking, Decreased balance, Decreased activity tolerance, Impaired vision/preception, Improper spinal/pelvic alignment  Visit Diagnosis: Other symptoms and signs involving the musculoskeletal system  Other lack of coordination  Attention and concentration deficit  Other symptoms and signs involving the nervous system  Other abnormalities of gait and mobility  Unsteadiness on feet    Problem List Patient Active Problem List   Diagnosis Date Noted  . Left foot pain 07/26/2018  . TMJ (temporomandibular joint syndrome) 10/10/2017  . Parkinson's disease ([place]) 03/20/2017  . Bilateral lower extremity edema 03/20/2017  . Chronic venous insufficiency 09/13/2016  . Fatigue 03/15/2016  . Parkinsonian features 09/15/2015  . ED (erectile dysfunction) 09/15/2015  . MDD (major depressive disorder), recurrent episode, severe ([place]) 02/10/2014  . Nonspecific abnormal electrocardiogram (ECG) (EKG) 02/07/2014  . Non-compliant behavior 02/03/2014  . Morton's neuroma of left foot 08/07/2013  . Attention deficit disorder without mention of hyperactivity 03/13/2012  . Hyperglycemia 01/26/2012  . Hypogonadism male 01/24/2012  . Syncope and collapse 01/20/2012  .  OSA (obstructive sleep apnea) 01/17/2011  . CHEST TIGHTNESS 02/18/2008    RINE,KATHRYN 08/16/2018, 11:46 AM  [facility] [address] [place], [place], [address] Phone: [phone]   Fax:  [phone]  Name: [name] [name] MRN: [id number] Date of Birth: 04/27/1951

## 2018-08-16 NOTE — Patient Instructions (Signed)
Tips for putting on a jacket  Hold the jacket with the inside lining facing away from you, with hands just above arm holes  Swing jacket around your back(like a bullfighter cape), put left arm in first   then continue to pull jacket round your back with BIG movements- put right  arm in sleeve   really push your arms into the sleeves with a BIG forceful movement,  Then pull jacket down in back using BIG movements and a firm grip

## 2018-08-16 NOTE — Patient Instructions (Signed)
Start entacapone 200 mg with each dosage of carbidopa/levodopa 25/100 (so you will take it three times per day)  Take all other medications as you previously were

## 2018-08-17 NOTE — Therapy (Signed)
[facility] [address] [place], [place], [address] Phone: [phone]   Fax:  [phone]  Physical Therapy Treatment  Patient Details  Name: [name] [name] MRN: [id number] Date of Birth: 1951/05/16 Referring Provider (PT): Dr. Carles Collet   Encounter Date: 08/16/2018  PT End of Session - 08/17/18 1123    Visit Number  6    Number of Visits  17    Date for PT Re-Evaluation  10/08/18    Authorization Type  Medicare and [place] visits (each discipline counts as 1 visit); 08/09/18:  per count-36 visits used, 39 remain (PT, OT, speech to each use 12)    Authorization - Visit Number  6    Authorization - Number of Visits  12    PT Start Time  1019    PT Stop Time  1100    PT Time Calculation (min)  41 min    Activity Tolerance  Patient limited by lethargy;Patient limited by fatigue    Behavior During Therapy  Flat affect       Past Medical History:  Diagnosis Date  .  OSA (obstructive sleep apnea)    npsg 2012:  AHI 67/hr. Auto titration 2012:  Optimal pressure 12cm.   . APPENDECTOMY, HX OF 02/18/2008   Qualifier: Diagnosis of  By: Wallace, [place]    . Cardiac murmur    as a child  . Chronic idiopathic constipation   . Depression   . Headache(784.0)   . HEADACHES, HX OF 02/18/2008   Qualifier: Diagnosis of  By: Danny Lawless CMA, [place]    . Parkinson disease ([place]) 11/2014  . Sinus mucosal thickening    pt unable to lay flat  . Streptococcal meningitis    as an infant    Past Surgical History:  Procedure Laterality Date  . APPENDECTOMY  1967  . COLONOSCOPY    . NASAL SINUS SURGERY     x 4 as a child  . VASECTOMY      There were no vitals filed for this visit.  Subjective Assessment - 08/16/18 1022    Subjective  Have this sensation like I'm dizzy and unsteady; like I might fall.  Plan to go to see Dr. Carles Collet today.    Pertinent History  depression, Parkinson's disease    Patient Stated Goals  Pt's  goals are to help with getting my foot better (explained this is a new injury and he would need to see MD specifically about that).  Also says to help with stiffness and shuffling.    Currently in Pain?  No/denies                       [facility] Adult PT Treatment/Exercise - 08/16/18 1024      Transfers   Transfers  Sit to Stand;Stand to Sit    Sit to Stand  5: Supervision;With upper extremity assist;With armrests;From chair/3-in-1    Stand to Sit  5: Supervision;With upper extremity assist;To chair/3-in-1;With armrests    Number of Reps  --   Multiple reps through session with gait and turns to sit     Ambulation/Gait   Ambulation/Gait  Yes    Ambulation/Gait Assistance  5: Supervision    Ambulation Distance (Feet)  320 Feet   230 ft, then short distances with cane   Assistive device  Straight cane    Gait Pattern  Step-through pattern;Decreased arm swing - right;Decreased arm swing - left;Decreased step length -  right;Decreased step length - left;Trunk flexed;Narrow base of support;Poor foot clearance - left;Poor foot clearance - right    Ambulation Surface  Level;Indoor    Pre-Gait Activities  With cane as support, marching in place 2 sets x 10 reps, cues for increased intensity of movement.  Pt performs well, with attention to movement, but pt focused on the fact he cannot lift and hold SLS.  Educated patient in use of high intensity movement to activate motion for improved foot clearance for turns, versus focus on static balance.    Gait Comments  Gait with turns using cane, cues for step height, foot clearance.  Trial of gait x 200 ft using rollator, cues for posture and step length.  No significant benefit noted with use of rollator.  Pt does not feel he is ready for rollator.      Exercises   Exercises  Knee/Hip      Knee/Hip Exercises: Standing   Other Standing Knee Exercises  Standing with cues for wide BOS and upright posture, using BoomWhackers, x 10 reps for  balance, posture in standing.      Knee/Hip Exercises: Seated   Other Seated Knee/Hip Exercises  Seated with upright posture with BoomWhackers x 15 reps, varied positions, for improved posture and trunk/postural strengthening    Sit to Sand  10 reps;without UE support   cues for upright posture              PT Short Term Goals - 07/10/18 2155      PT SHORT TERM GOAL #1   Title  Pt will be independent with HEP to target Parkinson's specific deficits.  TARGET for all STGs 08/31/18 (due to delayed start for visits after eval)    Time  4    Period  Weeks    Status  New      PT SHORT TERM GOAL #2   Title  Pt will improve TUG score to less than or equal to 15 seconds for decreased fall risk.    Time  4    Period  Weeks    Status  New      PT SHORT TERM GOAL #3   Title  Pt will improve DGI score to at least 15/24 for decreased fall risk.    Time  4    Period  Weeks    Status  New      PT SHORT TERM GOAL #4   Title  Pt will report at least 50% improvement in car transfers.    Time  4    Period  Weeks    Status  New      PT SHORT TERM GOAL #5   Title  Pt will verbalize understanding of fall prevention in home environment.    Time  4    Period  Weeks    Status  New        PT Long Term Goals - 07/10/18 2159      PT LONG TERM GOAL #1   Title  Pt/wife will verbalize understanding of tips to reduce freezing with gait.  UPDATED TARGET for all LTGs 09/28/18    Time  8    Period  Weeks    Status  New      PT LONG TERM GOAL #2   Title  Pt will improve gait velocity score to at least 2 ft/sec for improved gait efficiency and safety.    Time  8    Period  Weeks  Status  New      PT LONG TERM GOAL #3   Title  Pt will improve DGI score to at least 19/24 for decreased fall risk.    Time  8    Period  Weeks    Status  New      PT LONG TERM GOAL #4   Title  Pt will improve TUG cognitive score to less than or equal to 20 seconds for decreased fall risk.    Time  8     Period  Weeks    Status  New            Plan - 08/17/18 1124    Clinical Impression Statement  No c/o dizziness today, as he did with OT the last visit; however, he reports more unsteadiness with gait.  Trialed rollator for improved steadiness of gait, but no appreciable difference noted with posture and he appears to lean forward on rollator; pt doesn't feel he is ready for this.  His use of cane has helped improved step length overall with his gait.  Continue to focus on turns, transfers, and gait.  He will continue to beneift from skilled PT to address balance, functional strength, and gait for improved mobility and decreased fall risk.    Rehab Potential  Good    Clinical Impairments Affecting Rehab Potential  Memory deficits per speech eval notes    PT Frequency  2x / week    PT Duration  8 weeks   plus eval   PT Treatment/Interventions  ADLs/Self Care Home Management;DME Instruction;Balance training;Therapeutic exercise;Therapeutic activities;Functional mobility training;Gait training;Neuromuscular re-education;Patient/family education    PT Next Visit Plan  Functional strengthening; gait and turns; balance activities and posture; review and update HEP as needed.  Begin looking at short term goals.    Consulted and Agree with Plan of Care  Patient       Patient will benefit from skilled therapeutic intervention in order to improve the following deficits and impairments:  Abnormal gait, Decreased balance, Decreased mobility, Decreased coordination, Decreased strength, Difficulty walking, Impaired flexibility, Postural dysfunction  Visit Diagnosis: Other abnormalities of gait and mobility  Unsteadiness on feet  Abnormal posture     Problem List Patient Active Problem List   Diagnosis Date Noted  . Left foot pain 07/26/2018  . TMJ (temporomandibular joint syndrome) 10/10/2017  . Parkinson's disease ([place]) 03/20/2017  . Bilateral lower extremity edema 03/20/2017  .  Chronic venous insufficiency 09/13/2016  . Fatigue 03/15/2016  . Parkinsonian features 09/15/2015  . ED (erectile dysfunction) 09/15/2015  . MDD (major depressive disorder), recurrent episode, severe ([place]) 02/10/2014  . Nonspecific abnormal electrocardiogram (ECG) (EKG) 02/07/2014  . Non-compliant behavior 02/03/2014  . Morton's neuroma of left foot 08/07/2013  . 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 Hyperglycemia 01/26/2012  . Hypogonadism male 01/24/2012  . Syncope and collapse 01/20/2012  .  OSA (obstructive sleep apnea) 01/17/2011  . CHEST TIGHTNESS 02/18/2008    Huston Stonehocker W. 08/17/2018, 11:30 AM  Frazier Butt., PT   [facility] [address] [place], [place], [address] Phone: [phone]   Fax:  [phone]  Name: [name] [name] MRN: [id number] Date of Birth: 14-Nov-1950

## 2018-08-20 ENCOUNTER — Encounter (HOSPITAL_COMMUNITY): Payer: Self-pay | Admitting: Psychiatry

## 2018-08-20 ENCOUNTER — Ambulatory Visit (INDEPENDENT_AMBULATORY_CARE_PROVIDER_SITE_OTHER): Payer: Medicare Other | Admitting: Psychiatry

## 2018-08-20 VITALS — BP 128/84 | Ht 74.0 in | Wt 295.0 lb

## 2018-08-20 DIAGNOSIS — F411 Generalized anxiety disorder: Secondary | ICD-10-CM

## 2018-08-20 DIAGNOSIS — F332 Major depressive disorder, recurrent severe without psychotic features: Secondary | ICD-10-CM

## 2018-08-20 MED ORDER — BUPROPION HCL ER (XL) 300 MG PO TB24: ORAL_TABLET | ORAL | 0 refills | Status: DC

## 2018-08-20 MED ORDER — BUPROPION HCL ER (XL) 150 MG PO TB24: 150.0000 mg | ORAL_TABLET | ORAL | 0 refills | Status: DC

## 2018-08-20 MED ORDER — CITALOPRAM HYDROBROMIDE 40 MG PO TABS: 40.0000 mg | ORAL_TABLET | Freq: Every day | ORAL | 0 refills | Status: DC

## 2018-08-20 NOTE — Progress Notes (Signed)
[name] MD/PA/NP OP Progress Note  08/20/2018 9:02 AM [name] [name]  MRN:  [id number]  Chief Complaint: I am frustrated with my Parkinson.  I am easily forgetful and depressed.  HPI: [name] [name] came for his appointment with his wife.  He admitted being easily frustrated and sad as his Parkinson is getting worse.  He cannot do things that he used to do in the past.  He feels that he is burden to his wife as he require some help for his ADLs.  Recently he seen Dr. Guillermina [name] and Comtan was added.  He has multiple health issues including bladder issues, cognitive impairment, depression, Parkinson that is affecting his general health.  He admitted some days he gets very sad but denies any crying spells or any feeling of hopelessness or worthlessness.  He continues to attend twice a day neurocognitive rehab and trying to participate in boxing club for Parkinson.  He is sleeping okay.  He denies any hallucination or any paranoia.  He denies any agitation, anger or any aggressive behavior.  He started seeing Dr. Rexene [name] for CBT and supportive therapy.  He is also trying to do physical therapy but some days he is so tired that he does not go.  He is taking Celexa and Wellbutrin.  He has no side effects.  His appetite is okay.  He had discussed his neurologist about DBS but he was told that he is not ready at this time.  He has tremors and he is aware about Parkinson comorbidity that can cause worsening of depression and forgetfulness.  His wife is very supportive who takes him to doctor's appointment.  Patient denies drinking or using any illegal substances.    Visit Diagnosis:    ICD-10-CM   1. GAD (generalized anxiety disorder) F41.1 buPROPion (WELLBUTRIN XL) 150 MG 24 hr tablet  2. 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 No history of suicidal attempt or any inpatient psychiatric treatment. No history of mania, psychosis, paranoia or any hallucination. In the past he had tried Effexor, Abilify, Paxil, Cymbalta, Zoloft, amitriptyline, Pristiq, Britnellex, Celexa, Deplin,Vyvanseand Latuda. He is seeing in this [name] since September 2009. He has seen multiple psychiatrists in the past.   Past Medical History:  Past Medical History:  Diagnosis Date  .  OSA (obstructive sleep apnea)    npsg 2012:  AHI 67/hr. Auto titration 2012:  Optimal pressure 12cm.   . APPENDECTOMY, HX OF 02/18/2008   Qualifier: Diagnosis of  By: Wall, [name] [name]    . Cardiac murmur    as a child  . Chronic idiopathic constipation   . Depression   . Headache(784.0)   . HEADACHES, HX OF 02/18/2008   Qualifier: Diagnosis of  By: [name] [name] CMA, [name] [name]    . Parkinson disease ([name] [name]) 11/2014  . Sinus mucosal thickening    pt unable to lay flat  . Streptococcal meningitis    as an infant    Past Surgical History:  Procedure Laterality Date  . APPENDECTOMY  1967  . COLONOSCOPY    . NASAL SINUS SURGERY     x 4 as a child  . VASECTOMY      Family Psychiatric History: Reviewed.  Family History:  Family History  Problem Relation Age of Onset  . Lung cancer  Father   . Alcohol abuse Brother   . Drug abuse Brother   . Seizures Daughter   . Colon cancer Neg Hx   . Stomach cancer Neg Hx   . Rectal cancer Neg Hx   . Esophageal cancer Neg Hx     Social History:  Social History   Socioeconomic History  . Marital status: Married    Spouse name: Not on file  . Number of children: Y  . Years of education: Not on file  . Highest education level: Not on file  Occupational History  . Occupation: Armed forces operational officer: [name]    Comment: Special educational needs teacher  Social Needs  . Financial resource strain: Not on file  . Food insecurity:    Worry: Not on file    Inability: Not on file  . Transportation needs:     Medical: Not on file    Non-medical: Not on file  Tobacco Use  . Smoking status: Never Smoker  . Smokeless tobacco: Never Used  Substance and Sexual Activity  . Alcohol use: No    Alcohol/week: 0.0 standard drinks  . Drug use: No  . Sexual activity: Yes    Birth control/protection: None  Lifestyle  . Physical activity:    Days per week: Not on file    Minutes per session: Not on file  . Stress: Not on file  Relationships  . Social connections:    Talks on phone: Not on file    Gets together: Not on file    Attends religious service: Not on file    Active member of club or organization: Not on file    Attends meetings of clubs or organizations: Not on file    Relationship status: Not on file  Other Topics Concern  . Not on file  Social History Narrative   Wife-care partner     Allergies: No Known Allergies  Metabolic Disorder Labs: Lab Results  Component Value Date   HGBA1C 6.1 10/09/2017   No results found for: PROLACTIN Lab Results  Component Value Date   CHOL 172 10/09/2017   TRIG 120.0 10/09/2017   HDL 52.30 10/09/2017   CHOLHDL 3 10/09/2017   VLDL 24.0 10/09/2017   LDLCALC 96 10/09/2017   LDLCALC 127 (H) 03/14/2016   Lab Results  Component Value Date   TSH 1.16 03/14/2016   TSH 0.80 02/06/2014    Therapeutic Level Labs: No results found for: LITHIUM No results found for: VALPROATE No components found for:  CBMZ  Current Medications: Current Outpatient Medications  Medication Sig Dispense Refill  . B Complex-C (B-COMPLEX WITH VITAMIN C) tablet Take 1 tablet by mouth daily.    Marland Kitchen buPROPion (WELLBUTRIN XL) 300 MG 24 hr tablet TAKE 1 TABLET BY MOUTH EVERY DAY IN THE MORNING 90 tablet 0  . carbidopa-levodopa (SINEMET IR) 25-100 MG tablet 2 in the morning, 2 in the afternoon, and 1 in the evening 450 tablet 1  . citalopram (CELEXA) 40 MG tablet Take 1 tablet (40 mg total) by mouth daily. 90 tablet 0  . entacapone (COMTAN) 200 MG tablet Take 1 tablet (200  mg total) by mouth 3 (three) times daily. 90 tablet 5  . fesoterodine (TOVIAZ) 4 MG TB24 tablet Take 1 tablet (4 mg total) by mouth daily. 30 tablet 0  . LINZESS 290 MCG CAPS capsule TAKE 1 CAPSULE BY MOUTH DAILY BEFORE BREAKFAST. 30 capsule 5  . Omega 3-6-9 Fatty Acids (OMEGA 3-6-9 PO) Take by mouth.    Marland Kitchen  rOPINIRole (REQUIP) 1 MG tablet Take 1 tablet (1 mg total) by mouth 3 (three) times daily. 270 tablet 1  . TURMERIC CURCUMIN PO Take by mouth.    Marland Kitchen buPROPion (WELLBUTRIN XL) 150 MG 24 hr tablet Take 1 tablet (150 mg total) by mouth every morning. 30 tablet 0  . sildenafil (VIAGRA) 100 MG tablet Take 0.5-1 tablets (50-100 mg total) by mouth daily as needed for erectile dysfunction. (Patient not taking: Reported on 08/16/2018) 10 tablet 2   No current facility-administered medications for this visit.      Musculoskeletal: Strength & Muscle Tone: decreased Gait & Station: unsteady, shuffle Patient leans: Front and Backward  Psychiatric Specialty Exam: Review of Systems  Constitutional: Negative.   HENT: Negative.   Skin: Negative.   Neurological: Positive for tremors.  Psychiatric/Behavioral: Positive for depression and memory loss.    Blood pressure 128/84, height 6\' 2"  (1.88 m), weight 295 lb (133.8 kg).Body mass index is 37.88 kg/m.  General Appearance: Fairly Groomed  Eye Contact:  Fair  Speech:  Slow  Volume:  Decreased  Mood:  Depressed  Affect:  Constricted and Flat  Thought Process:  Descriptions of Associations: Intact  Orientation:  Full (Time, Place, and Person)  Thought Content: Poverty of thought content.  Preoccupied with his memory loss   Suicidal Thoughts:  No  Homicidal Thoughts:  No  Memory:  Immediate;   Fair Recent;   Poor Remote;   Fair  Judgement:  Good  Insight:  Present  Psychomotor Activity:  Decreased, Shuffling Gait and Tremor  Concentration:  Concentration: Fair and Attention Span: Fair  Recall:  [employer] of Knowledge: Fair  Language: Fair   Akathisia:  No  Handed:  Right  AIMS (if indicated): not done  Assets:  Desire for Improvement Housing Social Support  ADL's:  Intact  Cognition: Impaired,  Mild  Sleep:  Good   Screenings:   Assessment and Plan: Major depressive disorder, recurrent.  Generalized anxiety disorder.  Patient has been gradually declining due to worsening of Parkinson.  He admitted increased depression and he is anxious about his general health.  He has mild tremors and memory impairment.  I recommended to try increasing Wellbutrin to 450 mg to see if that helps his anxiety and depression.  I also discussed medication side effects possible worsening of Parkinson as it may cause worsening of tremors.  Patient and his wife agreed to give a trial.  I also encouraged to continue Dr. Rexene [name] for counseling and therapy.  Recently he was given Comtan to boost his antiparkinson meds.  I recommended to watch carefully any side effects and if he has any issues or worsening of symptoms that he should call [name] immediately.  I reviewed neurology notes and other collateral information.  I recommended to call [name] back if he has any question.  I will see him again in 3 months.  I will provide Wellbutrin XL 300 mg daily and Wellbutrin XL 150 mg to take in the morning.  Continue Celexa 40 mg daily.  Follow-up in 3 months.  Time spent more than 30 minutes.  50% of the time spent in psychoeducation, counseling, coordination of care and reviewing his chart and long-term prognosis.   Kathlee Nations, MD 08/20/2018, 9:02 AM

## 2018-08-21 ENCOUNTER — Encounter: Payer: Self-pay | Admitting: Speech Pathology

## 2018-08-21 ENCOUNTER — Encounter: Payer: Self-pay | Admitting: Occupational Therapy

## 2018-08-21 ENCOUNTER — Ambulatory Visit: Payer: Medicare Other | Admitting: Speech Pathology

## 2018-08-21 ENCOUNTER — Ambulatory Visit: Payer: Medicare Other | Admitting: Occupational Therapy

## 2018-08-21 ENCOUNTER — Encounter: Payer: Self-pay | Admitting: Physical Therapy

## 2018-08-21 ENCOUNTER — Ambulatory Visit: Payer: Medicare Other | Admitting: Physical Therapy

## 2018-08-21 DIAGNOSIS — R471 Dysarthria and anarthria: Secondary | ICD-10-CM

## 2018-08-21 DIAGNOSIS — R29898 Other symptoms and signs involving the musculoskeletal system: Secondary | ICD-10-CM | POA: Diagnosis not present

## 2018-08-21 DIAGNOSIS — R41841 Cognitive communication deficit: Secondary | ICD-10-CM

## 2018-08-21 DIAGNOSIS — R293 Abnormal posture: Secondary | ICD-10-CM

## 2018-08-21 DIAGNOSIS — R2689 Other abnormalities of gait and mobility: Secondary | ICD-10-CM | POA: Diagnosis not present

## 2018-08-21 DIAGNOSIS — R29818 Other symptoms and signs involving the nervous system: Secondary | ICD-10-CM | POA: Diagnosis not present

## 2018-08-21 DIAGNOSIS — R278 Other lack of coordination: Secondary | ICD-10-CM | POA: Diagnosis not present

## 2018-08-21 DIAGNOSIS — R2681 Unsteadiness on feet: Secondary | ICD-10-CM | POA: Diagnosis not present

## 2018-08-21 DIAGNOSIS — R41840 Attention and concentration deficit: Secondary | ICD-10-CM

## 2018-08-21 NOTE — Therapy (Signed)
[facility] [address] [place], [place], [address] Phone: [phone]   Fax:  [phone]  Speech Language Pathology Treatment  Patient Details  Name: [name] [name] MRN: [id number] Date of Birth: 1951/07/29 Referring Provider (SLP): Alonza Bogus, DO   Encounter Date: 08/21/2018  End of Session - 08/21/18 1219    Visit Number  7    Number of Visits  13    Date for SLP Re-Evaluation  09/07/18    Authorization Type  extended date for re-eval as pt initiated ST 2 weeks after eval (he had 1 session 1st week ST started)    SLP Start Time  1020    SLP Stop Time   1100    SLP Time Calculation (min)  40 min    Activity Tolerance  Patient tolerated treatment well       Past Medical History:  Diagnosis Date  .  OSA (obstructive sleep apnea)    npsg 2012:  AHI 67/hr. Auto titration 2012:  Optimal pressure 12cm.   . APPENDECTOMY, HX OF 02/18/2008   Qualifier: Diagnosis of  By: [place], [place]    . Cardiac murmur    as a child  . Chronic idiopathic constipation   . Depression   . Headache(784.0)   . HEADACHES, HX OF 02/18/2008   Qualifier: Diagnosis of  By: Danny Lawless CMA, [place]    . Parkinson disease ([place]) 11/2014  . Sinus mucosal thickening    pt unable to lay flat  . Streptococcal meningitis    as an infant    Past Surgical History:  Procedure Laterality Date  . APPENDECTOMY  1967  . COLONOSCOPY    . NASAL SINUS SURGERY     x 4 as a child  . VASECTOMY      There were no vitals filed for this visit.  Subjective Assessment - 08/21/18 1027    Subjective  "I only have turtle speed"    Currently in Pain?  No/denies            ADULT SLP TREATMENT - 08/21/18 1036      General Information   Behavior/Cognition  Alert;Cooperative;Pleasant mood      Treatment Provided   Treatment provided  Cognitive-Linquistic      Cognitive-Linquistic Treatment   Treatment focused on  Dysarthria;Cognition    Skilled Treatment  Pt reports doing loud /a/ in the car regularly. He didn't bring his mini note book, which was his HW to write down 3 topics he wanted to talk about in order to facilitate carryover of external aids to remember what he wants to say. In mildly complex conversation, pt carried over strategies of volume (average 68dB) and over articulation to be intellgible, with rare min A. 1 episode of pt losing train of thought. Instructed pt to educate/self advocate with family and friends that he requires extended time to respond in conversation.       Assessment / Recommendations / Plan   Plan  Continue with current plan of care      Progression Toward Goals   Progression toward goals  Progressing toward goals       SLP Education - 08/21/18 1213    Education Details  self advocate with communication partners; structured practice conversation with volume and ennunciation daily    Person(s) Educated  Patient    Methods  Explanation;Demonstration    Comprehension  Verbalized understanding       SLP Short Term Goals - 08/21/18 1218  SLP SHORT TERM GOAL #1   Title  Pt will carryover 2 compensations for attention/memory at home to complete daily chores/tasks with occasional min A over 2 sessions    Baseline  08/14/18; 08/16/18    Time  1    Period  Weeks    Status  Achieved      SLP SHORT TERM GOAL #2   Title  Pt will report carryover of compensations for slow processing in coversations (comprehension and expression) with occasional min A over 2 sessions    Baseline  08/21/18;     Time  1    Period  Weeks    Status  Partially Met      SLP SHORT TERM GOAL #3   Title  Pt will utilize compensations for dysarthria in structured tasks with occasional min A in three sessions    Baseline  08/14/18; 08/16/18; 08/21/18    Time  21    Period  Weeks    Status  Achieved      SLP SHORT TERM GOAL #4   Title  Formal cognitive linguistic assessment as indicated    Time  2    Period   Weeks    Status  Deferred       SLP Long Term Goals - 08/21/18 1218      SLP LONG TERM GOAL #1   Title  pt will demo attention skills adequate for functional verbal expression in 10 minutes simple-mod complex conversation over two sessions    Time  3    Period  Weeks    Status  On-going      SLP LONG TERM GOAL #2   Title  Pt will be intelligible in 15 minute converation in mildly noisy environment with occasional min A over 2 sessions    Time  3    Period  Weeks    Status  On-going      SLP LONG TERM GOAL #3   Title  Pt will report 25% reduction (subjectively) in requests for repetition or being told he is mumbling by his spouse over 3 sessions    Time  3    Status  On-going      SLP LONG TERM GOAL #4   Title  Pt will reports carryover of environmental compensations for improved intelligibility outside of therapy over 2 sessions    Time  3    Period  Weeks    Status  On-going       Plan - 08/21/18 1214    Clinical Impression Statement  Pt continues with inconistent follow up with HEP and cognitive compensatory strategies in conversation (modified scripting). Conversation over 15 minutes with 68dB average of carryover of over articulation with rare min A. Pt instructed to continue to practice at conversation level outside of ST daily. Continue skilled ST to maximize intelligiblity, particpation in conversation and compensations for memory/attention for improved independnece and to reduce caregiver burden.    Speech Therapy Frequency  2x / week    Duration  --   6 weeks or 13 visits   Treatment/Interventions  Language facilitation;Internal/external aids;Compensatory techniques;SLP instruction and feedback;Multimodal communcation approach;Cognitive reorganization;Functional tasks;Cueing hierarchy;Patient/family education    Potential Considerations  Severity of impairments;Previous level of function;Cooperation/participation level       Patient will benefit from skilled  therapeutic intervention in order to improve the following deficits and impairments:   Dysarthria and anarthria  Cognitive communication deficit    Problem List Patient Active Problem List  Diagnosis Date Noted  . Left foot pain 07/26/2018  . TMJ (temporomandibular joint syndrome) 10/10/2017  . Parkinson's disease ([place]) 03/20/2017  . Bilateral lower extremity edema 03/20/2017  . Chronic venous insufficiency 09/13/2016  . Fatigue 03/15/2016  . Parkinsonian features 09/15/2015  . ED (erectile dysfunction) 09/15/2015  . MDD (major depressive disorder), recurrent episode, severe ([place]) 02/10/2014  . Nonspecific abnormal electrocardiogram (ECG) (EKG) 02/07/2014  . Non-compliant behavior 02/03/2014  . Morton's neuroma of left foot 08/07/2013  . Attention deficit disorder without mention of hyperactivity 03/13/2012  . Hyperglycemia 01/26/2012  . Hypogonadism male 01/24/2012  . Syncope and collapse 01/20/2012  .  OSA (obstructive sleep apnea) 01/17/2011  . CHEST TIGHTNESS 02/18/2008    Lovvorn, Annye Rusk MS, CCC-SLP 08/21/2018, 12:22 PM  [facility] [address] [place], [place], [address] Phone: [phone]   Fax:  [phone]   Name: [name] [name] MRN: [id number] Date of Birth: [birth year]

## 2018-08-21 NOTE — Therapy (Signed)
[facility] [address] [place], [place], [address] Phone: [phone]   Fax:  [phone]  Occupational Therapy Treatment  Patient Details  Name: [name] [name] MRN: [id number] Date of Birth: May 07, 1951 Referring Provider (OT): Dr. Carles Collet   Encounter Date: 08/21/2018  OT End of Session - 08/21/18 2158    Visit Number  7    Number of Visits  17    Date for OT Re-Evaluation  09/08/18    Authorization Type  Medicare and federal [employer]--75 visit limit combined OT, PT, ST for year    Authorization Time Period  anticipate seeing pt for approx 6 weeks -12 visits due to visit limit    Authorization - Visit Number  7    Authorization - Number of Visits  12    OT Start Time  [id number]    OT Stop Time  1015    OT Time Calculation (min)  38 min    Activity Tolerance  Patient tolerated treatment well    Behavior During Therapy  Flat affect       Past Medical History:  Diagnosis Date  .  OSA (obstructive sleep apnea)    npsg 2012:  AHI 67/hr. Auto titration 2012:  Optimal pressure 12cm.   . APPENDECTOMY, HX OF 02/18/2008   Qualifier: Diagnosis of  By: [place], [place]    . Cardiac murmur    as a child  . Chronic idiopathic constipation   . Depression   . Headache(784.0)   . HEADACHES, HX OF 02/18/2008   Qualifier: Diagnosis of  By: Danny Lawless CMA, [place]    . Parkinson disease ([place]) 11/2014  . Sinus mucosal thickening    pt unable to lay flat  . Streptococcal meningitis    as an infant    Past Surgical History:  Procedure Laterality Date  . APPENDECTOMY  1967  . COLONOSCOPY    . NASAL SINUS SURGERY     x 4 as a child  . VASECTOMY      There were no vitals filed for this visit.  Subjective Assessment - 08/21/18 0939    Subjective   Pt reports that Dr. Carles Collet added medication.  "Feel ok, not great, but not bad."    Pertinent History  Parkinson's disease, seeing illusions, hx of falls, hx of significant depression,  mild cognitive impairment, sleep apnea, hypogonadism, ADD    Patient Stated Goals  improved balance and ease with ADLs    Currently in Pain?  No/denies       Pt was able to demo donning/doffing shoes/socks without significant difficulty (min incr time).  Reviewed cross leg stretches (pt returned demo) in prep for dressing.  Pt educated in additional strategies (see pt instructions) regarding sitting on firm surface and timing of medication.  Practiced simulated donning/doffing pants with theraband and instructed pt to don RLE first and doff LLE first, sit and pull up as much as possible and then stand to pull over hips..  Pt returned demo with cueing.  Practiced donning/doffing jacket with hands in arm holes big, then don like a cape and doff with trunk rotation.  Pt needed repetition and cueing to return demo successfully with performance inconsistent initially.      OT Education - 08/21/18 2157    Education Details  Dressing Strategies--see pt instructions (including timing with medications, sitting, strategies for donning pants, jacket, stretches)    Person(s) Educated  Patient    Methods  Explanation;Demonstration;Verbal cues;Handout  Comprehension  Verbalized understanding;Returned demonstration;Verbal cues required;Need further instruction       OT Short Term Goals - 08/16/18 0954      OT SHORT TERM GOAL #1   Title  Pt will be independent with PD specific  HEP.--    Time  4    Period  Weeks    Status  On-going      OT SHORT TERM GOAL #2   Title  Pt will verbalize understanding of ways to prevent future complications and current available/appropriate community resources prn.    Time  4    Period  Weeks    Status  On-going      OT SHORT TERM GOAL #3   Title  Pt will demonstrate improved fine motor coordination for ADLS as evidenced by decreasing LUE 9 hole peg test score to 47 secs or less.    Baseline  RUE 38.40, LUE 50.07 secs    Time  4    Period  Weeks    Status   On-going      OT SHORT TERM GOAL #4   Title  Pt will report incr ease with donning shirt, pants socks/shoes using adaptive strategies/AE prn.    Time  4    Period  Weeks    Status  On-going        OT Long Term Goals - 07/10/18 1430      OT LONG TERM GOAL #1   Title  Pt will verbalize understanding of adapted strategies for ADLs/IADLs including strategies to minimize freezing episodes    Time  8    Period  Weeks    Status  New      OT LONG TERM GOAL #2   Title  Pt will demonstrate improved ease with donning / doffing jacket as evidenced by decreasing PPT# 4 to 35 secs or less.    Time  8    Period  Weeks    Status  New      OT LONG TERM GOAL #3   Title  ------------------------------      OT LONG TERM GOAL #4   Title  ------------------------------------------      OT LONG TERM GOAL #5   Title  ---------------------            Plan - 08/21/18 2159    Clinical Impression Statement  Pt is progressing slowly towards goals for ADLs.  Pt continues to need cueing and repetition due to cognitive deficits.  Pt provided with written recommendations/instructions.    Occupational Profile and client history currently impacting functional performance  PMH that includes: seeing illusions, hx of falls, hx of significant depression, mild cognitive impairment, sleep apnea, hypogonadism, ADD. Pt reports recent falls and increased difficulty with orienting clothes to donn them.Pt retired due to difficulty at work and no longer drives. Pt reports slowing of ADLs and incr cognitive deficits impacting function.     Occupational performance deficits (Please refer to evaluation for details):  ADL's;IADL's;Leisure;Social Participation    Rehab Potential  Good    Current Impairments/barriers affecting progress:  cognitive deficits, depression, decreased balance, freezing episodes    OT Frequency  2x / week    OT Duration  8 weeks    OT Treatment/Interventions  Self-care/ADL training;Moist  Heat;Fluidtherapy;Balance training;Therapeutic activities;Cognitive remediation/compensation;Therapeutic exercise;Ultrasound;Cryotherapy;Neuromuscular education;Visual/perceptual remediation/compensation;Passive range of motion;Functional Mobility Training;Patient/family education;Manual Therapy;Energy conservation;Paraffin;DME and/or AE instruction;Aquatic Therapy    Plan  continue with ADL strategies for LB dressing, strategies for donning shirt; begin checking STGs  Consulted and Agree with Plan of Care  Patient       Patient will benefit from skilled therapeutic intervention in order to improve the following deficits and impairments:  Decreased cognition, Impaired flexibility, Decreased mobility, Decreased coordination, Decreased endurance, Decreased range of motion, Decreased strength, Impaired UE functional use, Impaired tone, Impaired perceived functional ability, Decreased safety awareness, Difficulty walking, Decreased balance, Decreased activity tolerance, Impaired vision/preception, Improper spinal/pelvic alignment  Visit Diagnosis: Other symptoms and signs involving the musculoskeletal system  Abnormal posture  Other abnormalities of gait and mobility  Other lack of coordination  Attention and concentration deficit  Other symptoms and signs involving the nervous system  Unsteadiness on feet    Problem List Patient Active Problem List   Diagnosis Date Noted  . Left foot pain 07/26/2018  . TMJ (temporomandibular joint syndrome) 10/10/2017  . Parkinson's disease ([place]) 03/20/2017  . Bilateral lower extremity edema 03/20/2017  . Chronic venous insufficiency 09/13/2016  . Fatigue 03/15/2016  . Parkinsonian features 09/15/2015  . ED (erectile dysfunction) 09/15/2015  . MDD (major depressive disorder), recurrent episode, severe ([place]) 02/10/2014  . Nonspecific abnormal electrocardiogram (ECG) (EKG) 02/07/2014  . Non-compliant behavior 02/03/2014  . Morton's neuroma of left  foot 08/07/2013  . Attention deficit disorder without mention of hyperactivity 03/13/2012  . Hyperglycemia 01/26/2012  . Hypogonadism male 01/24/2012  . Syncope and collapse 01/20/2012  .  OSA (obstructive sleep apnea) 01/17/2011  . CHEST TIGHTNESS 02/18/2008    [facility] 08/21/2018, 10:02 PM  [facility] [address] [place], [place], [address] Phone: [phone]   Fax:  [phone]  Name: [name] [name] MRN: [id number] Date of Birth: [birth year]   Vianne Bulls, OTR/L [facility] [address] [place], [place]  [address] [phone] phone [phone] 08/21/18 10:08 PM

## 2018-08-21 NOTE — Patient Instructions (Signed)
Fall Prevention in the Home Falls can cause injuries and can affect people from all age groups. There are many simple things that you can do to make your home safe and to help prevent falls. What can I do on the outside of my home?  Regularly repair the edges of walkways and driveways and fix any cracks.  Remove high doorway thresholds.  Trim any shrubbery on the main path into your home.  Use bright outdoor lighting.  Clear walkways of debris and clutter, including tools and rocks.  Regularly check that handrails are securely fastened and in good repair. Both sides of any steps should have handrails.  Install guardrails along the edges of any raised decks or porches.  Have leaves, snow, and ice cleared regularly.  Use sand or salt on walkways during winter months.  In the garage, clean up any spills right away, including grease or oil spills. What can I do in the bathroom?  Use night lights.  Install grab bars by the toilet and in the tub and shower. Do not use towel bars as grab bars.  Use non-skid mats or decals on the floor of the tub or shower.  If you need to sit down while you are in the shower, use a plastic, non-slip stool.  Keep the floor dry. Immediately clean up any water that spills on the floor.  Remove soap buildup in the tub or shower on a regular basis.  Attach bath mats securely with double-sided non-slip rug tape.  Remove throw rugs and other tripping hazards from the floor. What can I do in the bedroom?  Use night lights.  Make sure that a bedside light is easy to reach.  Do not use oversized bedding that drapes onto the floor.  Have a firm chair that has side arms to use for getting dressed.  Remove throw rugs and other tripping hazards from the floor. What can I do in the kitchen?  Clean up any spills right away.  Avoid walking on wet floors.  Place frequently used items in easy-to-reach places.  If you need to reach for something above  you, use a sturdy step stool that has a grab bar.  Keep electrical cables out of the way.  Do not use floor polish or wax that makes floors slippery. If you have to use wax, make sure that it is non-skid floor wax.  Remove throw rugs and other tripping hazards from the floor. What can I do in the stairways?  Do not leave any items on the stairs.  Make sure that there are handrails on both sides of the stairs. Fix handrails that are broken or loose. Make sure that handrails are as long as the stairways.  Check any carpeting to make sure that it is firmly attached to the stairs. Fix any carpet that is loose or worn.  Avoid having throw rugs at the top or bottom of stairways, or secure the rugs with carpet tape to prevent them from moving.  Make sure that you have a light switch at the top of the stairs and the bottom of the stairs. If you do not have them, have them installed. What are some other fall prevention tips?  Wear closed-toe shoes that fit well and support your feet. Wear shoes that have rubber soles or low heels.  When you use a stepladder, make sure that it is completely opened and that the sides are firmly locked. Have someone hold the ladder while you are using   it. Do not climb a closed stepladder.  Add color or contrast paint or tape to grab bars and handrails in your home. Place contrasting color strips on the first and last steps.  Use mobility aids as needed, such as canes, walkers, scooters, and crutches.  Turn on lights if it is dark. Replace any light bulbs that burn out.  Set up furniture so that there are clear paths. Keep the furniture in the same spot.  Fix any uneven floor surfaces.  Choose a carpet design that does not hide the edge of steps of a stairway.  Be aware of any and all pets.  Review your medicines with your healthcare provider. Some medicines can cause dizziness or changes in blood pressure, which increase your risk of falling. Talk with  your health care provider about other ways that you can decrease your risk of falls. This may include working with a physical therapist or trainer to improve your strength, balance, and endurance. This information is not intended to replace advice given to you by your health care provider. Make sure you discuss any questions you have with your health care provider. Document Released: 09/09/2002 Document Revised: 02/16/2016 Document Reviewed: 10/24/2014 Elsevier Interactive Patient Education  2018 Elsevier Inc.  

## 2018-08-21 NOTE — Patient Instructions (Addendum)
   1.  Sit down to get dressed.   It may be easier to sit and dress on chair or bench.    2.  Try waiting at least 30 minutes after taking you medication before trying to get dressed to give medication time to work in the morning.  Do stretches when waiting for medication.  PWR! Moves laying in bed and leg stretches.    3.  Try "dressing" stretching during commercials when watching tv.  4.  Sit and put right leg in pants/underwear first.  Then put left leg in.  Get your feet positioned apart and stand up with big movements.  Then pull pants up over hips.  When taking pants off, take them off left leg first.    5.  When putting on jacket, hold jacket in arm holes (put hands in big) with inside of jacket facing forward, then swing around like a cape.

## 2018-08-21 NOTE — Patient Instructions (Signed)
   Your family and friends need to give you extra time to process what they and for you to formulate what you want to say and how to say it  You can educate family and friends about this - self advocacy is OK!  Parkinson's can cause slow processing in your brain, as well as slower movement   Keep practicing talking loud and big, as well as keeping attention in conversation - you may need formal practice time for conversations - like the lady you visit in church

## 2018-08-22 NOTE — Therapy (Signed)
[facility] [address] [place], [place], [address] Phone: [phone]   Fax:  [phone]  Physical Therapy Treatment  Patient Details  Name: [name] [name] MRN: [id number] Date of Birth: Feb 07, 1951 Referring Provider (PT): Dr. Carles Collet   Encounter Date: 08/21/2018  PT End of Session - 08/21/18 1700    Visit Number  7    Number of Visits  13   corrected based on available visit coverage   Date for PT Re-Evaluation  10/08/18    Authorization Type  Medicare and [place] visits (each discipline counts as 1 visit); 08/09/18:  per count-36 visits used, 39 remain (PT, OT, speech to each use 12)    Authorization - Visit Number  7    Authorization - Number of Visits  12    PT Start Time  0845    PT Stop Time  0928    PT Time Calculation (min)  43 min    Activity Tolerance  Patient tolerated treatment well    Behavior During Therapy  Flat affect       Past Medical History:  Diagnosis Date  .  OSA (obstructive sleep apnea)    npsg 2012:  AHI 67/hr. Auto titration 2012:  Optimal pressure 12cm.   . APPENDECTOMY, HX OF 02/18/2008   Qualifier: Diagnosis of  By: [place], [place]    . Cardiac murmur    as a child  . Chronic idiopathic constipation   . Depression   . Headache(784.0)   . HEADACHES, HX OF 02/18/2008   Qualifier: Diagnosis of  By: Danny Lawless CMA, [place]    . Parkinson disease ([place]) 11/2014  . Sinus mucosal thickening    pt unable to lay flat  . Streptococcal meningitis    as an infant    Past Surgical History:  Procedure Laterality Date  . APPENDECTOMY  1967  . COLONOSCOPY    . NASAL SINUS SURGERY     x 4 as a child  . VASECTOMY      There were no vitals filed for this visit.  Subjective Assessment - 08/21/18 0850    Subjective  No falls. Got his shoes he ordered from [employer] and they are too big and he needs to exchange them. Forgot to put his orthotics back into his shoes he is wearing  today    Pertinent History  depression, Parkinson's disease    Patient Stated Goals  Pt's goals are to help with getting my foot better (explained this is a new injury and he would need to see MD specifically about that).  Also says to help with stiffness and shuffling.    Currently in Pain?  Yes    Pain Score  1     Pain Location  Foot    Pain Orientation  Left    Pain Descriptors / Indicators  Aching    Pain Type  Acute pain    Pain Onset  1 to 4 weeks ago    Pain Frequency  Constant    Aggravating Factors   I don't know    Pain Relieving Factors  unsure         [facility] PT Assessment - 08/21/18 0001      Dynamic Gait Index   Level Surface  Moderate Impairment    Change in Gait Speed  Moderate Impairment    Gait with Horizontal Head Turns  Normal    Gait with Vertical Head Turns  Mild Impairment  Gait and Pivot Turn  Normal    Step Over Obstacle  Normal    Step Around Obstacles  Normal    Steps  Mild Impairment    Total Score  18    DGI comment:  19 or less are predictive of falls in older community living adults      Timed Up and Go Test   Normal TUG (seconds)  19.78    Cognitive TUG (seconds)  30.5                           PT Education - 08/21/18 1700    Education Details  results of assessment; fall prevention tips    Person(s) Educated  Patient    Methods  Explanation;Handout    Comprehension  Verbalized understanding;Need further instruction       PT Short Term Goals - 08/21/18 0920      PT SHORT TERM GOAL #1   Title  Pt will be independent with HEP to target Parkinson's specific deficits.  TARGET for all STGs 08/31/18 (due to delayed start for visits after eval)    Time  4    Period  Weeks    Status  Partially Met      PT SHORT TERM GOAL #2   Title  Pt will improve TUG score to less than or equal to 15 seconds for decreased fall risk.    Baseline  08/21/18 19.78 sec    Time  4    Period  Weeks    Status  Partially Met      PT  SHORT TERM GOAL #3   Title  Pt will improve DGI score to at least 15/24 for decreased fall risk.    Baseline  08/21/18  18/24    Time  4    Period  Weeks    Status  Achieved      PT SHORT TERM GOAL #4   Title  Pt will report at least 50% improvement in car transfers.    Baseline  08/21/18 20% improvement    Time  4    Period  Weeks    Status  Partially Met      PT SHORT TERM GOAL #5   Title  Pt will verbalize understanding of fall prevention in home environment.    Baseline  08/21/18 education provided    Time  4    Period  Weeks    Status  On-going        PT Long Term Goals - 08/22/18 0518      PT LONG TERM GOAL #1   Title  Pt/wife will verbalize understanding of tips to reduce freezing with gait.  UPDATED TARGET for all LTGs 09/28/18--changed to 09/07/18 due to insurance limitations    Time  8    Period  Weeks    Status  New      PT LONG TERM GOAL #2   Title  Pt will improve gait velocity score to at least 2 ft/sec for improved gait efficiency and safety.    Time  8    Period  Weeks    Status  New      PT LONG TERM GOAL #3   Title  Pt will improve DGI score to at least 19/24 for decreased fall risk.    Time  8    Period  Weeks    Status  New      PT LONG TERM  GOAL #4   Title  Pt will improve TUG cognitive score to less than or equal to 20 seconds for decreased fall risk.    Time  8    Period  Weeks    Status  New            Plan - 08/21/18 1700    Clinical Impression Statement  STGs assessed earlier than originally planned due to patient having fewer approved insurance visits than expected. Of his 5 STGs pt met one, partially met 3 (progress made, but not to goal level), and one goal is ongoing. OVerall, pt moving better today with less fatigue/lethargy. Testing required incr time due to pt's bradykinesia and decr cognition. Overall, pt is making progress towards goals and can continue to benefit from PT.     Rehab Potential  Good    Clinical Impairments  Affecting Rehab Potential  Memory deficits per speech eval notes    PT Frequency  2x / week    PT Duration  8 weeks   plus eval   PT Treatment/Interventions  ADLs/Self Care Home Management;DME Instruction;Balance training;Therapeutic exercise;Therapeutic activities;Functional mobility training;Gait training;Neuromuscular re-education;Patient/family education    PT Next Visit Plan  Functional strengthening; gait and turns; balance activities and posture; review and update HEP as needed.  As of 11/19 has 5 visits remaining    Consulted and Agree with Plan of Care  Patient       Patient will benefit from skilled therapeutic intervention in order to improve the following deficits and impairments:  Abnormal gait, Decreased balance, Decreased mobility, Decreased coordination, Decreased strength, Difficulty walking, Impaired flexibility, Postural dysfunction  Visit Diagnosis: Abnormal posture  Other abnormalities of gait and mobility  Other symptoms and signs involving the nervous system  Unsteadiness on feet     Problem List Patient Active Problem List   Diagnosis Date Noted  . Left foot pain 07/26/2018  . TMJ (temporomandibular joint syndrome) 10/10/2017  . Parkinson's disease ([place]) 03/20/2017  . Bilateral lower extremity edema 03/20/2017  . Chronic venous insufficiency 09/13/2016  . Fatigue 03/15/2016  . Parkinsonian features 09/15/2015  . ED (erectile dysfunction) 09/15/2015  . MDD (major depressive disorder), recurrent episode, severe ([place]) 02/10/2014  . Nonspecific abnormal electrocardiogram (ECG) (EKG) 02/07/2014  . Non-compliant behavior 02/03/2014  . Morton's neuroma of left foot 08/07/2013  . Attention deficit disorder without mention of hyperactivity 03/13/2012  . Hyperglycemia 01/26/2012  . Hypogonadism male 01/24/2012  . Syncope and collapse 01/20/2012  .  OSA (obstructive sleep apnea) 01/17/2011  . CHEST TIGHTNESS 02/18/2008    Rexanne Mano, PT 08/22/2018, 5:20  AM  [facility] [address] [place], [place], [address] Phone: [phone]   Fax:  [phone]  Name: KETIH GOODIE MRN: [id number] Date of Birth: [birth year]

## 2018-08-23 ENCOUNTER — Ambulatory Visit: Payer: Medicare Other | Admitting: Occupational Therapy

## 2018-08-23 ENCOUNTER — Ambulatory Visit: Payer: Medicare Other | Admitting: Physical Therapy

## 2018-08-23 ENCOUNTER — Ambulatory Visit (INDEPENDENT_AMBULATORY_CARE_PROVIDER_SITE_OTHER): Payer: Medicare Other | Admitting: Psychology

## 2018-08-23 ENCOUNTER — Ambulatory Visit: Payer: Medicare Other | Admitting: Speech Pathology

## 2018-08-23 ENCOUNTER — Encounter: Payer: Self-pay | Admitting: Physical Therapy

## 2018-08-23 ENCOUNTER — Encounter: Payer: Self-pay | Admitting: Speech Pathology

## 2018-08-23 DIAGNOSIS — R29898 Other symptoms and signs involving the musculoskeletal system: Secondary | ICD-10-CM | POA: Diagnosis not present

## 2018-08-23 DIAGNOSIS — R29818 Other symptoms and signs involving the nervous system: Secondary | ICD-10-CM

## 2018-08-23 DIAGNOSIS — R278 Other lack of coordination: Secondary | ICD-10-CM

## 2018-08-23 DIAGNOSIS — R41840 Attention and concentration deficit: Secondary | ICD-10-CM | POA: Diagnosis not present

## 2018-08-23 DIAGNOSIS — R471 Dysarthria and anarthria: Secondary | ICD-10-CM

## 2018-08-23 DIAGNOSIS — R2681 Unsteadiness on feet: Secondary | ICD-10-CM

## 2018-08-23 DIAGNOSIS — R2689 Other abnormalities of gait and mobility: Secondary | ICD-10-CM

## 2018-08-23 DIAGNOSIS — R41841 Cognitive communication deficit: Secondary | ICD-10-CM

## 2018-08-23 DIAGNOSIS — R293 Abnormal posture: Secondary | ICD-10-CM

## 2018-08-23 DIAGNOSIS — F4323 Adjustment disorder with mixed anxiety and depressed mood: Secondary | ICD-10-CM | POA: Diagnosis not present

## 2018-08-23 NOTE — Therapy (Signed)
[facility] [address] [place], [place], [address] Phone: [phone]   Fax:  [phone]  Speech Language Pathology Treatment  Patient Details  Name: [name] [name] MRN: [id number] Date of Birth: 13-Oct-1950 Referring Provider (SLP): Alonza Bogus, DO   Encounter Date: 08/23/2018  End of Session - 08/23/18 1151    Visit Number  8    Number of Visits  13    Date for SLP Re-Evaluation  09/07/18    Authorization Type  extended date for re-eval as pt initiated ST 2 weeks after eval (he had 1 session 1st week ST started)    SLP Start Time  1016    SLP Stop Time   1058    SLP Time Calculation (min)  42 min    Activity Tolerance  Patient tolerated treatment well       Past Medical History:  Diagnosis Date  .  OSA (obstructive sleep apnea)    npsg 2012:  AHI 67/hr. Auto titration 2012:  Optimal pressure 12cm.   . APPENDECTOMY, HX OF 02/18/2008   Qualifier: Diagnosis of  By: [place], [place]    . Cardiac murmur    as a child  . Chronic idiopathic constipation   . Depression   . Headache(784.0)   . HEADACHES, HX OF 02/18/2008   Qualifier: Diagnosis of  By: Danny Lawless CMA, [place]    . Parkinson disease ([place]) 11/2014  . Sinus mucosal thickening    pt unable to lay flat  . Streptococcal meningitis    as an infant    Past Surgical History:  Procedure Laterality Date  . APPENDECTOMY  1967  . COLONOSCOPY    . NASAL SINUS SURGERY     x 4 as a child  . VASECTOMY      There were no vitals filed for this visit.  Subjective Assessment - 08/23/18 1042    Subjective  "[place] been to [employer] [place]"    Currently in Pain?  No/denies            ADULT SLP TREATMENT - 08/23/18 1043      General Information   Behavior/Cognition  Alert;Cooperative;Pleasant mood      Treatment Provided   Treatment provided  Cognitive-Linquistic      Pain Assessment   Pain Assessment  No/denies pain       Cognitive-Linquistic Treatment   Treatment focused on  Dysarthria;Cognition    Skilled Treatment  Treatment targeted attention and intelligilbiliy at conversation level verbalizing problem solving of high level ADL's. Pt maintained over articulation and volume with rare min visual and verbal cues.        Assessment / Recommendations / Plan   Plan  Continue with current plan of care      Progression Toward Goals   Progression toward goals  Progressing toward goals         SLP Short Term Goals - 08/23/18 1151      SLP SHORT TERM GOAL #1   Title  Pt will carryover 2 compensations for attention/memory at home to complete daily chores/tasks with occasional min A over 2 sessions    Baseline  08/14/18; 08/16/18    Time  1    Period  Weeks    Status  Achieved      SLP SHORT TERM GOAL #2   Title  Pt will report carryover of compensations for slow processing in coversations (comprehension and expression) with occasional  min A over 2 sessions    Baseline  08/21/18;     Time  1    Period  Weeks    Status  Partially Met      SLP SHORT TERM GOAL #3   Title  Pt will utilize compensations for dysarthria in structured tasks with occasional min A in three sessions    Baseline  08/14/18; 08/16/18; 08/21/18    Time  21    Period  Weeks    Status  Achieved      SLP SHORT TERM GOAL #4   Title  Formal cognitive linguistic assessment as indicated    Time  2    Period  Weeks    Status  Deferred       SLP Long Term Goals - 08/23/18 1151      SLP LONG TERM GOAL #1   Title  pt will demo attention skills adequate for functional verbal expression in 10 minutes simple-mod complex conversation over two sessions    Time  3    Period  Weeks    Status  On-going      SLP LONG TERM GOAL #2   Title  Pt will be intelligible in 15 minute converation in mildly noisy environment with occasional min A over 2 sessions    Time  3    Period  Weeks    Status  On-going      SLP LONG TERM GOAL #3    Title  Pt will report 25% reduction (subjectively) in requests for repetition or being told he is mumbling by his spouse over 3 sessions    Time  3    Status  On-going      SLP LONG TERM GOAL #4   Title  Pt will reports carryover of environmental compensations for improved intelligibility outside of therapy over 2 sessions    Time  3    Period  Weeks    Status  On-going       Plan - 08/23/18 1151    Clinical Impression Statement  Pt continues with inconistent follow up with HEP and cognitive compensatory strategies in conversation (modified scripting). Conversation over 15 minutes with 68dB average of carryover of over articulation with rare min A. Pt instructed to continue to practice at conversation level outside of ST daily. Continue skilled ST to maximize intelligiblity, particpation in conversation and compensations for memory/attention for improved independnece and to reduce caregiver burden.    Speech Therapy Frequency  2x / week    Treatment/Interventions  Language facilitation;Internal/external aids;Compensatory techniques;SLP instruction and feedback;Multimodal communcation approach;Cognitive reorganization;Functional tasks;Cueing hierarchy;Patient/family education    Potential to Achieve Goals  Good    Potential Considerations  Severity of impairments;Previous level of function;Cooperation/participation level    Consulted and Agree with Plan of Care  Patient       Patient will benefit from skilled therapeutic intervention in order to improve the following deficits and impairments:   Dysarthria and anarthria  Cognitive communication deficit    Problem List Patient Active Problem List   Diagnosis Date Noted  . Left foot pain 07/26/2018  . TMJ (temporomandibular joint syndrome) 10/10/2017  . Parkinson's disease ([place]) 03/20/2017  . Bilateral lower extremity edema 03/20/2017  . Chronic venous insufficiency 09/13/2016  . Fatigue 03/15/2016  . Parkinsonian features  09/15/2015  . ED (erectile dysfunction) 09/15/2015  . MDD (major depressive disorder), recurrent episode, severe ([place]) 02/10/2014  . Nonspecific abnormal electrocardiogram (ECG) (EKG) 02/07/2014  . Non-compliant behavior 02/03/2014  .  Morton's neuroma of left foot 08/07/2013  . Attention deficit disorder without mention of hyperactivity 03/13/2012  . Hyperglycemia 01/26/2012  . Hypogonadism male 01/24/2012  . Syncope and collapse 01/20/2012  .  OSA (obstructive sleep apnea) 01/17/2011  . CHEST TIGHTNESS 02/18/2008    Julliana Whitmyer, Annye Rusk MS, CCC-SLP 08/23/2018, 11:52 AM  [facility] [address] [place], [place], [address] Phone: [phone]   Fax:  [phone]   Name: DESHANE COTRONEO MRN: [id number] Date of Birth: March 18, 1951

## 2018-08-23 NOTE — Therapy (Signed)
[facility] [address] [place], [place], [address] Phone: [phone]   Fax:  [phone]  Occupational Therapy Treatment  Patient Details  Name: [name] [name] MRN: [id number] Date of Birth: 12/01/1950 Referring Provider (OT): Dr. Carles Collet   Encounter Date: 08/23/2018  OT End of Session - 08/23/18 1143    Visit Number  8    Number of Visits  17    Date for OT Re-Evaluation  09/08/18    Authorization Type  Medicare and federal [employer]--75 visit limit combined OT, PT, ST for year    Authorization Time Period  anticipate seeing pt for approx 6 weeks -12 visits due to visit limit    Authorization - Visit Number  8    Authorization - Number of Visits  12    OT Start Time  0935    OT Stop Time  1015    OT Time Calculation (min)  40 min       Past Medical History:  Diagnosis Date  .  OSA (obstructive sleep apnea)    npsg 2012:  AHI 67/hr. Auto titration 2012:  Optimal pressure 12cm.   . APPENDECTOMY, HX OF 02/18/2008   Qualifier: Diagnosis of  By: [place], [place]    . Cardiac murmur    as a child  . Chronic idiopathic constipation   . Depression   . Headache(784.0)   . HEADACHES, HX OF 02/18/2008   Qualifier: Diagnosis of  By: Danny Lawless CMA, [place]    . Parkinson disease ([place]) 11/2014  . Sinus mucosal thickening    pt unable to lay flat  . Streptococcal meningitis    as an infant    Past Surgical History:  Procedure Laterality Date  . APPENDECTOMY  1967  . COLONOSCOPY    . NASAL SINUS SURGERY     x 4 as a child  . VASECTOMY      There were no vitals filed for this visit.  Subjective Assessment - 08/23/18 1143    Pertinent History  Parkinson's disease, seeing illusions, hx of falls, hx of significant depression, mild cognitive impairment, sleep apnea, hypogonadism, ADD    Patient Stated Goals  improved balance and ease with ADLs    Currently in Pain?  No/denies                 Treatment:Reviewed stretches from HEP (ie: reach for floor, leg stretches). Discussed progress towards ADL goals. Reviewed importance of timing of medicine and not eating protein rich items with meds. Pt reports performing stretches and they are increasing ease with LB dressing. Reviewed donning /doffing jacket with adapted technique several times, pt returned demonstration and met his goal. PWR! Hands followed by 9 hole peg test. Pt met his goal            OT Short Term Goals - 08/23/18 1003      OT SHORT TERM GOAL #1   Title  Pt will be independent with PD specific  HEP.--    Status  Achieved      OT SHORT TERM GOAL #2   Title  Pt will verbalize understanding of ways to prevent future complications and current available/appropriate community resources prn.    Status  On-going      OT SHORT TERM GOAL #3   Title  Pt will demonstrate improved fine motor coordination for ADLS as evidenced by decreasing LUE 9 hole peg test score to 47 secs or less.    Status  Achieved   33.68 secs     OT SHORT TERM GOAL #4   Title  Pt will report incr ease with donning shirt, pants socks/shoes using adaptive strategies/AE prn.    Status  On-going   pants, socks, shoes improved, nees practice with shirt       OT Long Term Goals - 08/23/18 1013      OT LONG TERM GOAL #1   Title  Pt will verbalize understanding of adapted strategies for ADLs/IADLs including strategies to minimize freezing episodes    Time  8    Period  Weeks    Status  On-going      OT LONG TERM GOAL #2   Title  Pt will demonstrate improved ease with donning / doffing jacket as evidenced by decreasing PPT# 4 to 35 secs or less.    Time  8    Period  Weeks    Status  Achieved   13.44 secs, pt using his own jacket     OT LONG TERM GOAL #3   Title  ------------------------------      OT LONG TERM GOAL #4   Title  ------------------------------------------      OT LONG TERM GOAL #5   Title   ---------------------            Plan - 08/23/18 1145    Clinical Impression Statement  Pt demonstrates good overall progress. He achieved his coordination goal, and goal for donning doffing his jacket.     Occupational Profile and client history currently impacting functional performance  PMH that includes: seeing illusions, hx of falls, hx of significant depression, mild cognitive impairment, sleep apnea, hypogonadism, ADD. Pt reports recent falls and increased difficulty with orienting clothes to donn them.Pt retired due to difficulty at work and no longer drives. Pt reports slowing of ADLs and incr cognitive deficits impacting function.     Occupational performance deficits (Please refer to evaluation for details):  ADL's;IADL's;Leisure;Social Participation    Rehab Potential  Good    Current Impairments/barriers affecting progress:  cognitive deficits, depression, decreased balance, freezing episodes    OT Frequency  2x / week    OT Duration  8 weeks    OT Treatment/Interventions  Self-care/ADL training;Moist Heat;Fluidtherapy;Balance training;Therapeutic activities;Cognitive remediation/compensation;Therapeutic exercise;Ultrasound;Cryotherapy;Neuromuscular education;Visual/perceptual remediation/compensation;Passive range of motion;Functional Mobility Training;Patient/family education;Manual Therapy;Energy conservation;Paraffin;DME and/or AE instruction;Aquatic Therapy    Plan  work towards remaining unmet goals, review donning/ doffing shirt    Consulted and Agree with Plan of Care  Patient       Patient will benefit from skilled therapeutic intervention in order to improve the following deficits and impairments:  Decreased cognition, Impaired flexibility, Decreased mobility, Decreased coordination, Decreased endurance, Decreased range of motion, Decreased strength, Impaired UE functional use, Impaired tone, Impaired perceived functional ability, Decreased safety awareness,  Difficulty walking, Decreased balance, Decreased activity tolerance, Impaired vision/preception, Improper spinal/pelvic alignment  Visit Diagnosis: Other symptoms and signs involving the nervous system  Other symptoms and signs involving the musculoskeletal system  Other lack of coordination  Attention and concentration deficit  Other abnormalities of gait and mobility  Unsteadiness on feet    Problem List Patient Active Problem List   Diagnosis Date Noted  . Left foot pain 07/26/2018  . TMJ (temporomandibular joint syndrome) 10/10/2017  . Parkinson's disease ([place]) 03/20/2017  . Bilateral lower extremity edema 03/20/2017  . Chronic venous insufficiency 09/13/2016  . Fatigue 03/15/2016  . Parkinsonian features 09/15/2015  . ED (erectile dysfunction) 09/15/2015  . MDD (major  depressive disorder), recurrent episode, severe ([place]) 02/10/2014  . Nonspecific abnormal electrocardiogram (ECG) (EKG) 02/07/2014  . Non-compliant behavior 02/03/2014  . Morton's neuroma of left foot 08/07/2013  . Attention deficit disorder without mention of hyperactivity 03/13/2012  . Hyperglycemia 01/26/2012  . Hypogonadism male 01/24/2012  . Syncope and collapse 01/20/2012  .  OSA (obstructive sleep apnea) 01/17/2011  . CHEST TIGHTNESS 02/18/2008    RINE,KATHRYN 08/23/2018, 11:47 AM  [facility] [address] [place], [place], [address] Phone: [phone]   Fax:  [phone]  Name: [name] [name] MRN: [id number] Date of Birth: Dec 27, 1950

## 2018-08-23 NOTE — Therapy (Signed)
[facility] [address] [place], [place], [address] Phone: [phone]   Fax:  [phone]  Physical Therapy Treatment  Patient Details  Name: [name] [name] MRN: [id number] Date of Birth: 05/22/1951 Referring Provider (PT): Dr. Carles Collet   Encounter Date: 08/23/2018  PT End of Session - 08/23/18 2039    Visit Number  8    Number of Visits  13   corrected based on available visit coverage   Date for PT Re-Evaluation  10/08/18    Authorization Type  Medicare and [place] visits (each discipline counts as 1 visit); 08/09/18:  per count-36 visits used, 39 remain (PT, OT, speech to each use 12)    Authorization - Visit Number  8    Authorization - Number of Visits  12    PT Start Time  [phone]    PT Stop Time  0936    PT Time Calculation (min)  44 min    Activity Tolerance  Patient tolerated treatment well    Behavior During Therapy  Flat affect       Past Medical History:  Diagnosis Date  .  OSA (obstructive sleep apnea)    npsg 2012:  AHI 67/hr. Auto titration 2012:  Optimal pressure 12cm.   . APPENDECTOMY, HX OF 02/18/2008   Qualifier: Diagnosis of  By: [place], [place]    . Cardiac murmur    as a child  . Chronic idiopathic constipation   . Depression   . Headache(784.0)   . HEADACHES, HX OF 02/18/2008   Qualifier: Diagnosis of  By: Danny Lawless CMA, [place]    . Parkinson disease ([place]) 11/2014  . Sinus mucosal thickening    pt unable to lay flat  . Streptococcal meningitis    as an infant    Past Surgical History:  Procedure Laterality Date  . APPENDECTOMY  1967  . COLONOSCOPY    . NASAL SINUS SURGERY     x 4 as a child  . VASECTOMY      There were no vitals filed for this visit.  Subjective Assessment - 08/23/18 0856    Subjective  Went to [place] earlier this week and that was successful.  Still waiting on the new shoes.    Pertinent History  depression, Parkinson's disease    Patient Stated  Goals  Pt's goals are to help with getting my foot better (explained this is a new injury and he would need to see MD specifically about that).  Also says to help with stiffness and shuffling.    Currently in Pain?  No/denies    Pain Onset  1 to 4 weeks ago                       [facility] Adult PT Treatment/Exercise - 08/23/18 0001      Ambulation/Gait   Ambulation/Gait  Yes    Ambulation/Gait Assistance  5: Supervision    Ambulation Distance (Feet)  400 Feet   320   Assistive device  Straight cane    Gait Pattern  Step-through pattern;Decreased arm swing - left;Decreased step length - right;Decreased step length - left;Trunk flexed;Narrow base of support   Improved foot clearance with use of cane   Ambulation Surface  Level;Indoor    Pre-Gait Activities  Initial and occasional cues for increased step length, upright posture with gait.  Pt reports feeling he is walking faster with these cues.    Gait Comments  Gait negotiating  narrow spaces x 3 reps, cues to continue to take long strides with gait.      High Level Balance   High Level Balance Comments  Corner standing balance exercises:  standing on solid surface with feet apart, then feet stagger stance:  head turns x 5, head nods x 5, then EC x 10 seconds head steady.      Knee/Hip Exercises: Aerobic   Stepper  SciFit Seated Stepper, Level 2, 4 extremities x 5 minutes, for warm-up, cues for increased intensity      Knee/Hip Exercises: Standing   Other Standing Knee Exercises  Alternating step taps x 10 reps, cues for increased foot clearance, then forward step up/up and down/down x 10 reps; attempted forward step up (single leg), but pt unable to comfortably do at Aerobic step with 1 UE support only.  At aerobic step, 2nd set of step up/up-down/down x 5 reps each leg,for functional stregnthening      Knee/Hip Exercises: Seated   Other Seated Knee/Hip Exercises  Sit<>stand holding weighted ball, 2 sets x 5 reps         [facility]) - 08/23/18 0900    PWR! exercises  Functional moves    PWR! Sit to Stand  x 5 reps followed by PWR! UP each rep, then x 5 reps followed by PWR! Rock each rep, then 5 reps followed by [employer]! Twist each rep, then x 5 reps followed by PWR! Step each rep.  Cues for intensity, larger amplitude movement and best posture.            PT Short Term Goals - 08/21/18 0920      PT SHORT TERM GOAL #1   Title  Pt will be independent with HEP to target Parkinson's specific deficits.  TARGET for all STGs 08/31/18 (due to delayed start for visits after eval)    Time  4    Period  Weeks    Status  Partially Met      PT SHORT TERM GOAL #2   Title  Pt will improve TUG score to less than or equal to 15 seconds for decreased fall risk.    Baseline  08/21/18 19.78 sec    Time  4    Period  Weeks    Status  Partially Met      PT SHORT TERM GOAL #3   Title  Pt will improve DGI score to at least 15/24 for decreased fall risk.    Baseline  08/21/18  18/24    Time  4    Period  Weeks    Status  Achieved      PT SHORT TERM GOAL #4   Title  Pt will report at least 50% improvement in car transfers.    Baseline  08/21/18 20% improvement    Time  4    Period  Weeks    Status  Partially Met      PT SHORT TERM GOAL #5   Title  Pt will verbalize understanding of fall prevention in home environment.    Baseline  08/21/18 education provided    Time  4    Period  Weeks    Status  On-going        PT Long Term Goals - 08/22/18 0518      PT LONG TERM GOAL #1   Title  Pt/wife will verbalize understanding of tips to reduce freezing with gait.  UPDATED TARGET for all LTGs 09/28/18--changed to 09/07/18 due to insurance  limitations    Time  8    Period  Weeks    Status  New      PT LONG TERM GOAL #2   Title  Pt will improve gait velocity score to at least 2 ft/sec for improved gait efficiency and safety.    Time  8    Period  Weeks    Status  New      PT LONG TERM GOAL #3   Title   Pt will improve DGI score to at least 19/24 for decreased fall risk.    Time  8    Period  Weeks    Status  New      PT LONG TERM GOAL #4   Title  Pt will improve TUG cognitive score to less than or equal to 20 seconds for decreased fall risk.    Time  8    Period  Weeks    Status  New            Plan - 08/23/18 2043    Clinical Impression Statement  Skilled PT session today focused on functional strengthening activities, as pt has reported in previous sessions he feels his legs are weak.  Today, he reports that he does less here in PT sessions than he does at gym with the trainer.  He continues to need cues from therapist for increased intensity and amplitude of movement patterns to offset his bradykinesia and forward posture patterns.  He will benefit from additional therapy sessions to work towards [place] for improved strength, balance, and gait.    Rehab Potential  Good    Clinical Impairments Affecting Rehab Potential  Memory deficits per speech eval notes    PT Frequency  2x / week    PT Duration  8 weeks   plus eval   PT Treatment/Interventions  ADLs/Self Care Home Management;DME Instruction;Balance training;Therapeutic exercise;Therapeutic activities;Functional mobility training;Gait training;Neuromuscular re-education;Patient/family education    PT Next Visit Plan  Functional strengthening; gait and turns; balance activities and posture; review and update HEP as needed.  For best carryover, ?invite wife in for PT education for HEP or talk with trainer at ACT to help with carryover of exercises post d/c. As of 11/21, pt has 4 visits remaining    Consulted and Agree with Plan of Care  Patient       Patient will benefit from skilled therapeutic intervention in order to improve the following deficits and impairments:  Abnormal gait, Decreased balance, Decreased mobility, Decreased coordination, Decreased strength, Difficulty walking, Impaired flexibility, Postural  dysfunction  Visit Diagnosis: Unsteadiness on feet  Abnormal posture  Other abnormalities of gait and mobility     Problem List Patient Active Problem List   Diagnosis Date Noted  . Left foot pain 07/26/2018  . TMJ (temporomandibular joint syndrome) 10/10/2017  . Parkinson's disease ([place]) 03/20/2017  . Bilateral lower extremity edema 03/20/2017  . Chronic venous insufficiency 09/13/2016  . Fatigue 03/15/2016  . Parkinsonian features 09/15/2015  . ED (erectile dysfunction) 09/15/2015  . MDD (major depressive disorder), recurrent episode, severe ([place]) 02/10/2014  . Nonspecific abnormal electrocardiogram (ECG) (EKG) 02/07/2014  . Non-compliant behavior 02/03/2014  . Morton's neuroma of left foot 08/07/2013  . Attention deficit disorder without mention of hyperactivity 03/13/2012  . Hyperglycemia 01/26/2012  . Hypogonadism male 01/24/2012  . Syncope and collapse 01/20/2012  .  OSA (obstructive sleep apnea) 01/17/2011  . CHEST TIGHTNESS 02/18/2008    Aldrin Engelhard W. 08/23/2018, 8:48 PM  Mady Haagensen W., PT  [facility] [address] [place], [place], [address] Phone: [phone]   Fax:  [phone]  Name: ASIER DESROCHES MRN: [id number] Date of Birth: 08/07/1951

## 2018-08-27 ENCOUNTER — Encounter: Payer: Self-pay | Admitting: Speech Pathology

## 2018-08-27 ENCOUNTER — Ambulatory Visit: Payer: Medicare Other | Admitting: Occupational Therapy

## 2018-08-27 ENCOUNTER — Ambulatory Visit: Payer: Medicare Other | Admitting: Speech Pathology

## 2018-08-27 ENCOUNTER — Ambulatory Visit: Payer: Medicare Other | Admitting: Physical Therapy

## 2018-08-27 ENCOUNTER — Ambulatory Visit: Payer: Self-pay | Admitting: Neurology

## 2018-08-27 DIAGNOSIS — R2681 Unsteadiness on feet: Secondary | ICD-10-CM | POA: Diagnosis not present

## 2018-08-27 DIAGNOSIS — R293 Abnormal posture: Secondary | ICD-10-CM

## 2018-08-27 DIAGNOSIS — R278 Other lack of coordination: Secondary | ICD-10-CM

## 2018-08-27 DIAGNOSIS — R29898 Other symptoms and signs involving the musculoskeletal system: Secondary | ICD-10-CM | POA: Diagnosis not present

## 2018-08-27 DIAGNOSIS — R41841 Cognitive communication deficit: Secondary | ICD-10-CM

## 2018-08-27 DIAGNOSIS — R29818 Other symptoms and signs involving the nervous system: Secondary | ICD-10-CM | POA: Diagnosis not present

## 2018-08-27 DIAGNOSIS — R2689 Other abnormalities of gait and mobility: Secondary | ICD-10-CM

## 2018-08-27 DIAGNOSIS — R41840 Attention and concentration deficit: Secondary | ICD-10-CM | POA: Diagnosis not present

## 2018-08-27 DIAGNOSIS — R471 Dysarthria and anarthria: Secondary | ICD-10-CM

## 2018-08-27 NOTE — Therapy (Signed)
[facility] [address] [place], [place], [address] Phone: [phone]   Fax:  [phone]  Physical Therapy Treatment  Patient Details  Name: [name] [name] MRN: [id number] Date of Birth: June 24, 1951 Referring Provider (PT): Dr. Carles Collet   Encounter Date: 08/27/2018  PT End of Session - 08/27/18 2117    Visit Number  9    Number of Visits  13   corrected based on available visit coverage   Date for PT Re-Evaluation  10/08/18    Authorization Type  Medicare and [place] visits (each discipline counts as 1 visit); 08/09/18:  per count-36 visits used, 39 remain (PT, OT, speech to each use 12)    Authorization - Visit Number  9    Authorization - Number of Visits  12    PT Start Time  0806    PT Stop Time  0847    PT Time Calculation (min)  41 min    Activity Tolerance  Patient tolerated treatment well    Behavior During Therapy  Flat affect       Past Medical History:  Diagnosis Date  .  OSA (obstructive sleep apnea)    npsg 2012:  AHI 67/hr. Auto titration 2012:  Optimal pressure 12cm.   . APPENDECTOMY, HX OF 02/18/2008   Qualifier: Diagnosis of  By: [place], [place]    . Cardiac murmur    as a child  . Chronic idiopathic constipation   . Depression   . Headache(784.0)   . HEADACHES, HX OF 02/18/2008   Qualifier: Diagnosis of  By: Danny Lawless CMA, [place]    . Parkinson disease ([place]) 11/2014  . Sinus mucosal thickening    pt unable to lay flat  . Streptococcal meningitis    as an infant    Past Surgical History:  Procedure Laterality Date  . APPENDECTOMY  1967  . COLONOSCOPY    . NASAL SINUS SURGERY     x 4 as a child  . VASECTOMY      There were no vitals filed for this visit.  Subjective Assessment - 08/27/18 2058    Subjective  Felt really good over the weekend.  Got the new shoes and they are working out okay, no problems.    Pertinent History  depression, Parkinson's disease    Patient  Stated Goals  Pt's goals are to help with getting my foot better (explained this is a new injury and he would need to see MD specifically about that).  Also says to help with stiffness and shuffling.    Currently in Pain?  No/denies    Pain Onset  1 to 4 weeks ago                       [facility] Adult PT Treatment/Exercise - 08/27/18 0001      Ambulation/Gait   Ambulation/Gait  Yes    Ambulation/Gait Assistance  5: Supervision    Ambulation/Gait Assistance Details  Cues for looking ahead at visual target, to decrease looking down at ground with gait    Ambulation Distance (Feet)  200 Feet   no device, then 212fx 2, 60 ft x 4 reps turning, with cane   Assistive device  Straight cane;None    Gait Pattern  Step-through pattern;Decreased arm swing - left;Decreased step length - right;Decreased step length - left;Trunk flexed;Narrow base of support    Ambulation Surface  Level;Indoor    Pre-Gait Activities  Cues with gait  and turns for increased foot clearance, weightshifting for improved turns.      Self-Care   Self-Care  Other Self-Care Comments    Other Self-Care Comments   Discussed ongoing community fitness, in light of visit limitations, with plans for d/c next week.  Provided exercise chart, outlining pt's current and recommended optimal exercise routine after discharge from [facility] 3x/wk, ACT with trainer 2x/wk, using his recumbent bike daily.  Recommended PT HEP 3x/wk alternating with OT HEP 3x/wk.  Discusses PARTS program and [facility] Cycling class (has done in the past, might be interested in again, and PT provided information)        [facility]) - 08/27/18 2102    PWR! exercises  Functional moves;Moves in standing    PWR! Up  PWR! Up x 10 reps    PWR! [employer]! Rock x 10 reps each side    PWR! Twist  PWR! Twist x 10 reps each side    PWR Step  PWR! Step x 10 reps each side    Comments  Cues given for larger amplitude, increased  intensity of movement patterns    PWR! Sit to Stand  x 10 reps with cues for upright stand          PT Education - 08/27/18 2116    Education Details  Exercise chart for optimal weekly exercise, HEP instructions for standing PWR! Moves and for sit<>stand    Person(s) Educated  Patient    Methods  Explanation;Demonstration;Handout    Comprehension  Verbalized understanding;Returned demonstration;Verbal cues required       PT Short Term Goals - 08/21/18 0920      PT SHORT TERM GOAL #1   Title  Pt will be independent with HEP to target Parkinson's specific deficits.  TARGET for all STGs 08/31/18 (due to delayed start for visits after eval)    Time  4    Period  Weeks    Status  Partially Met      PT SHORT TERM GOAL #2   Title  Pt will improve TUG score to less than or equal to 15 seconds for decreased fall risk.    Baseline  08/21/18 19.78 sec    Time  4    Period  Weeks    Status  Partially Met      PT SHORT TERM GOAL #3   Title  Pt will improve DGI score to at least 15/24 for decreased fall risk.    Baseline  08/21/18  18/24    Time  4    Period  Weeks    Status  Achieved      PT SHORT TERM GOAL #4   Title  Pt will report at least 50% improvement in car transfers.    Baseline  08/21/18 20% improvement    Time  4    Period  Weeks    Status  Partially Met      PT SHORT TERM GOAL #5   Title  Pt will verbalize understanding of fall prevention in home environment.    Baseline  08/21/18 education provided    Time  4    Period  Weeks    Status  On-going        PT Long Term Goals - 08/22/18 0518      PT LONG TERM GOAL #1   Title  Pt/wife will verbalize understanding of tips to reduce freezing with gait.  UPDATED TARGET for all LTGs 09/28/18--changed to  09/07/18 due to insurance limitations    Time  8    Period  Weeks    Status  New      PT LONG TERM GOAL #2   Title  Pt will improve gait velocity score to at least 2 ft/sec for improved gait efficiency and safety.     Time  8    Period  Weeks    Status  New      PT LONG TERM GOAL #3   Title  Pt will improve DGI score to at least 19/24 for decreased fall risk.    Time  8    Period  Weeks    Status  New      PT LONG TERM GOAL #4   Title  Pt will improve TUG cognitive score to less than or equal to 20 seconds for decreased fall risk.    Time  8    Period  Weeks    Status  New            Plan - 08/27/18 2117    Clinical Impression Statement  Skilled PT sessions today focused on standing PWR! Moves and functional strengthening with sit<>stand, gait training activities, and education in continuing optimal fitness upon d/c from PT.  He reports he is noticing some improvement in how he is moving, especially since adding bike back into regular exercise routine.  He needs cues during exercises and gait for larger amplitude step length higher intensity with his movement patterns.  He will benefit from skilled PT to work towards [place] for improved functional mobility.    Rehab Potential  Good    Clinical Impairments Affecting Rehab Potential  Memory deficits per speech eval notes    PT Frequency  2x / week    PT Duration  8 weeks   plus eval   PT Treatment/Interventions  ADLs/Self Care Home Management;DME Instruction;Balance training;Therapeutic exercise;Therapeutic activities;Functional mobility training;Gait training;Neuromuscular re-education;Patient/family education    PT Next Visit Plan  Review standing PWR! Moves added to HEP this visit; per OT, pt requests practice getting up from floor.  Begin looking at LTGs and plans for d/c next week.  Next visit is 10th visit-will need progress note.    Consulted and Agree with Plan of Care  Patient       Patient will benefit from skilled therapeutic intervention in order to improve the following deficits and impairments:  Abnormal gait, Decreased balance, Decreased mobility, Decreased coordination, Decreased strength, Difficulty walking, Impaired  flexibility, Postural dysfunction  Visit Diagnosis: Other symptoms and signs involving the nervous system  Abnormal posture  Other abnormalities of gait and mobility  Unsteadiness on feet     Problem List Patient Active Problem List   Diagnosis Date Noted  . Left foot pain 07/26/2018  . TMJ (temporomandibular joint syndrome) 10/10/2017  . Parkinson's disease ([place]) 03/20/2017  . Bilateral lower extremity edema 03/20/2017  . Chronic venous insufficiency 09/13/2016  . Fatigue 03/15/2016  . Parkinsonian features 09/15/2015  . ED (erectile dysfunction) 09/15/2015  . MDD (major depressive disorder), recurrent episode, severe ([place]) 02/10/2014  . Nonspecific abnormal electrocardiogram (ECG) (EKG) 02/07/2014  . Non-compliant behavior 02/03/2014  . Morton's neuroma of left foot 08/07/2013  . Attention deficit disorder without mention of hyperactivity 03/13/2012  . Hyperglycemia 01/26/2012  . Hypogonadism male 01/24/2012  . Syncope and collapse 01/20/2012  .  OSA (obstructive sleep apnea) 01/17/2011  . CHEST TIGHTNESS 02/18/2008    Heyward Douthit W. 08/27/2018, 9:22 PM  Emeric Novinger W.,  [facility] [address] [place], [place], [address] Phone: [phone]   Fax:  [phone]  Name: [name] [name] MRN: [id number] Date of Birth: 1951/04/15

## 2018-08-27 NOTE — Therapy (Signed)
[facility] [address] [place], [place], [address] Phone: [phone]   Fax:  [phone]  Speech Language Pathology Treatment  Patient Details  Name: [name] [name] MRN: [id number] Date of Birth: 10-23-1950 Referring Provider (SLP): Alonza Bogus, DO   Encounter Date: 08/27/2018  End of Session - 08/27/18 1157    Visit Number  9    Number of Visits  13    Date for SLP Re-Evaluation  09/07/18    SLP Start Time  60    SLP Stop Time   1142    SLP Time Calculation (min)  40 min    Activity Tolerance  Patient tolerated treatment well       Past Medical History:  Diagnosis Date  .  OSA (obstructive sleep apnea)    npsg 2012:  AHI 67/hr. Auto titration 2012:  Optimal pressure 12cm.   . APPENDECTOMY, HX OF 02/18/2008   Qualifier: Diagnosis of  By: [place], [place]    . Cardiac murmur    as a child  . Chronic idiopathic constipation   . Depression   . Headache(784.0)   . HEADACHES, HX OF 02/18/2008   Qualifier: Diagnosis of  By: Danny Lawless CMA, [place]    . Parkinson disease ([place]) 11/2014  . Sinus mucosal thickening    pt unable to lay flat  . Streptococcal meningitis    as an infant    Past Surgical History:  Procedure Laterality Date  . APPENDECTOMY  1967  . COLONOSCOPY    . NASAL SINUS SURGERY     x 4 as a child  . VASECTOMY      There were no vitals filed for this visit.  Subjective Assessment - 08/27/18 1105    Subjective  "Myy eyes were moving when I did my AH!"    Currently in Pain?  No/denies            ADULT SLP TREATMENT - 08/27/18 1113      General Information   Behavior/Cognition  Alert;Cooperative;Pleasant mood      Treatment Provided   Treatment provided  Cognitive-Linquistic      Pain Assessment   Pain Assessment  No/denies pain      Cognitive-Linquistic Treatment   Treatment focused on  Dysarthria;Cognition    Skilled Treatment  Pt entered room with hoarse voice.  Loud /a/ to recalibrate voice and volume. Pt required frequent cues to increase volume to elimiate hoarseness in loud "AH." Oral reading targeting over articulation and volume.  Simple converation targeting carryover of compensations for intelligibiltiy and attention.       Assessment / Recommendations / Plan   Plan  Continue with current plan of care      Progression Toward Goals   Progression toward goals  Not progressing toward goals (comment)   inconsistent f/u with HW and HEP      SLP Education - 08/27/18 1150    Education provided  Yes    Education Details  daily voice exercise required to keep voice strong    Person(s) Educated  Patient    Methods  Explanation;Demonstration;Handout    Comprehension  Verbalized understanding       SLP Short Term Goals - 08/27/18 1156      SLP SHORT TERM GOAL #1   Title  Pt will carryover 2 compensations for attention/memory at home to complete daily chores/tasks with occasional min A over 2 sessions    Baseline  08/14/18; 08/16/18    Time  1    Period  Weeks    Status  Achieved      SLP SHORT TERM GOAL #2   Title  Pt will report carryover of compensations for slow processing in coversations (comprehension and expression) with occasional min A over 2 sessions    Baseline  08/21/18;     Time  1    Period  Weeks    Status  Partially Met      SLP SHORT TERM GOAL #3   Title  Pt will utilize compensations for dysarthria in structured tasks with occasional min A in three sessions    Baseline  08/14/18; 08/16/18; 08/21/18    Time  21    Period  Weeks    Status  Achieved      SLP SHORT TERM GOAL #4   Title  Formal cognitive linguistic assessment as indicated    Time  2    Period  Weeks    Status  Deferred       SLP Long Term Goals - 08/27/18 1156      SLP LONG TERM GOAL #1   Title  pt will demo attention skills adequate for functional verbal expression in 10 minutes simple-mod complex conversation over two sessions    Baseline   08/27/18;     Time  2    Period  Weeks    Status  On-going      SLP LONG TERM GOAL #2   Title  Pt will be intelligible in 15 minute converation in mildly noisy environment with occasional min A over 2 sessions    Baseline  08/27/18    Time  2    Period  Weeks    Status  On-going      SLP LONG TERM GOAL #3   Title  Pt will report 25% reduction (subjectively) in requests for repetition or being told he is mumbling by his spouse over 3 sessions    Time  2    Status  On-going      SLP LONG TERM GOAL #4   Title  Pt will reports carryover of environmental compensations for improved intelligibility outside of therapy over 2 sessions    Time  3    Period  Weeks    Status  On-going       Plan - 08/27/18 1151    Clinical Impression Statement  Pt continues with inconsistent f/u with HEP and cognitive compensations for conversation (modified scripting, pre-practice). With occasional min A for overarticulation and volume, pt intelligible in mildly noisy environment. Continue skilled ST 2-3 more visits to maximize carryover for dysarthria and cognition for improved QOL and to reduce caregiver burden    Speech Therapy Frequency  2x / week    Duration  --   6 weeks or 13 visits   Treatment/Interventions  Language facilitation;Internal/external aids;Compensatory techniques;SLP instruction and feedback;Multimodal communcation approach;Cognitive reorganization;Functional tasks;Cueing hierarchy;Patient/family education    Potential to Achieve Goals  Good    Potential Considerations  Severity of impairments;Previous level of function;Cooperation/participation level    Consulted and Agree with Plan of Care  Patient       Patient will benefit from skilled therapeutic intervention in order to improve the following deficits and impairments:   Dysarthria and anarthria  Cognitive communication deficit    Problem List Patient Active Problem List   Diagnosis Date Noted  . Left foot pain 07/26/2018   . TMJ (temporomandibular joint syndrome) 10/10/2017  . Parkinson's disease ([place]) 03/20/2017  .  Bilateral lower extremity edema 03/20/2017  . Chronic venous insufficiency 09/13/2016  . Fatigue 03/15/2016  . Parkinsonian features 09/15/2015  . ED (erectile dysfunction) 09/15/2015  . MDD (major depressive disorder), recurrent episode, severe ([place]) 02/10/2014  . Nonspecific abnormal electrocardiogram (ECG) (EKG) 02/07/2014  . Non-compliant behavior 02/03/2014  . Morton's neuroma of left foot 08/07/2013  . Attention deficit disorder without mention of hyperactivity 03/13/2012  . Hyperglycemia 01/26/2012  . Hypogonadism male 01/24/2012  . Syncope and collapse 01/20/2012  .  OSA (obstructive sleep apnea) 01/17/2011  . CHEST TIGHTNESS 02/18/2008    Lovvorn, Annye Rusk MS, CCC-SLP 08/27/2018, 11:59 AM  [facility] [address] [place], [place], [address] Phone: [phone]   Fax:  [phone]   Name: [name] [name] MRN: [id number] Date of Birth: 09-Dec-1950

## 2018-08-27 NOTE — Patient Instructions (Addendum)
(  Exercise) Monday Tuesday Wednesday Thursday Friday Saturday Sunday    BOXING   [place]        Lenise Arena    [place]  Program

## 2018-08-27 NOTE — Therapy (Signed)
[facility] [address] [place], [place], [address] Phone: [phone]   Fax:  [phone]  Occupational Therapy Treatment  Patient Details  Name: [name] [name] MRN: [id number] Date of Birth: 23-Jun-1951 Referring Provider (OT): Dr. Carles Collet   Encounter Date: 08/27/2018  OT End of Session - 08/27/18 1023    Visit Number  9    Number of Visits  17    Date for OT Re-Evaluation  09/08/18    Authorization Type  Medicare and federal BCBS--75 visit limit combined OT, PT, ST for year    Authorization Time Period  anticipate seeing pt for approx 6 weeks -12 visits due to visit limit    Authorization - Visit Number  9    Authorization - Number of Visits  12    OT Start Time  1018    OT Stop Time  1100    OT Time Calculation (min)  42 min    Activity Tolerance  Patient tolerated treatment well    Behavior During Therapy  Flat affect       Past Medical History:  Diagnosis Date  .  OSA (obstructive sleep apnea)    npsg 2012:  AHI 67/hr. Auto titration 2012:  Optimal pressure 12cm.   . APPENDECTOMY, HX OF 02/18/2008   Qualifier: Diagnosis of  By: [place], [place]    . Cardiac murmur    as a child  . Chronic idiopathic constipation   . Depression   . Headache(784.0)   . HEADACHES, HX OF 02/18/2008   Qualifier: Diagnosis of  By: Danny Lawless CMA, [place]    . Parkinson disease ([place]) 11/2014  . Sinus mucosal thickening    pt unable to lay flat  . Streptococcal meningitis    as an infant    Past Surgical History:  Procedure Laterality Date  . APPENDECTOMY  1967  . COLONOSCOPY    . NASAL SINUS SURGERY     x 4 as a child  . VASECTOMY      There were no vitals filed for this visit.  Subjective Assessment - 08/27/18 1021    Subjective   Pt reports he thinks he pulled a muscle in his neck    Pertinent History  Parkinson's disease, seeing illusions, hx of falls, hx of significant depression, mild cognitive impairment,  sleep apnea, hypogonadism, ADD    Patient Stated Goals  improved balance and ease with ADLs    Currently in Pain?  Yes    Pain Location  Neck    Pain Descriptors / Indicators  Aching    Pain Type  Acute pain    Pain Onset  1 to 4 weeks ago    Pain Frequency  Intermittent    Aggravating Factors   unknown    Pain Relieving Factors  unknown    Multiple Pain Sites  No                Treatment: Education regarding donning/ doffing pullover shirt with adapted strategies, pt practiced x 3 reps with increased time and mod v.c initially. Second trial pt had perceptual difficulties and he put arm though neck hole. With cueing to slow down and to put head through neck hole first, pt performed better. Dynamic step and reach to flip large playing cards, min v.c for larger amplitude movements. Education regarding community resources and ways to prevent future complications. Pt verbalized understanding  OT Short Term Goals - 08/27/18 1053      OT SHORT TERM GOAL #1   Title  Pt will be independent with PD specific  HEP.--    Status  Achieved      OT SHORT TERM GOAL #2   Title  Pt will verbalize understanding of ways to prevent future complications and current available/appropriate community resources prn.      OT SHORT TERM GOAL #3   Title  Pt will demonstrate improved fine motor coordination for ADLS as evidenced by decreasing LUE 9 hole peg test score to 47 secs or less.    Status  Achieved   33.68 secs     OT SHORT TERM GOAL #4   Title  Pt will report incr ease with donning shirt, pants socks/shoes using adaptive strategies/AE prn.    Status  On-going   pants, socks, shoes improved, nees practice with shirt       OT Long Term Goals - 08/27/18 1025      OT LONG TERM GOAL #1   Title  Pt will verbalize understanding of adapted strategies for ADLs/IADLs including strategies to minimize freezing episodes    Status  On-going      OT LONG TERM GOAL #2    Title  Pt will demonstrate improved ease with donning / doffing jacket as evidenced by decreasing PPT# 4 to 35 secs or less.    Status  Achieved              Patient will benefit from skilled therapeutic intervention in order to improve the following deficits and impairments:     Visit Diagnosis: Unsteadiness on feet  Other symptoms and signs involving the nervous system  Other symptoms and signs involving the musculoskeletal system  Other lack of coordination    Problem List Patient Active Problem List   Diagnosis Date Noted  . Left foot pain 07/26/2018  . TMJ (temporomandibular joint syndrome) 10/10/2017  . Parkinson's disease ([place]) 03/20/2017  . Bilateral lower extremity edema 03/20/2017  . Chronic venous insufficiency 09/13/2016  . Fatigue 03/15/2016  . Parkinsonian features 09/15/2015  . ED (erectile dysfunction) 09/15/2015  . MDD (major depressive disorder), recurrent episode, severe ([place]) 02/10/2014  . Nonspecific abnormal electrocardiogram (ECG) (EKG) 02/07/2014  . Non-compliant behavior 02/03/2014  . Morton's neuroma of left foot 08/07/2013  . Attention deficit disorder without mention of hyperactivity 03/13/2012  . Hyperglycemia 01/26/2012  . Hypogonadism male 01/24/2012  . Syncope and collapse 01/20/2012  .  OSA (obstructive sleep apnea) 01/17/2011  . CHEST TIGHTNESS 02/18/2008    Rosealynn Mateus 08/27/2018, 4:07 PM  [facility] [address] [place], [place], [address] Phone: [phone]   Fax:  [phone]  Name: [name] [name] MRN: [id number] Date of Birth: 21-Jul-1951

## 2018-08-27 NOTE — Patient Instructions (Signed)
Ways to prevent future Parkinson's related complications: 1.   Exercise regularly,  2.   Focus on BIGGER movements during daily activities- really reach overhead, straighten elbows and extend fingers 3.   When dressing or reaching for your seatbelt make sure to use your body to assist by twisting while you reach-this can help to minimize stress on the shoulder and reduce the risk of a rotator cuff tear 4.   Swing your arms when you walk! People with PD are at increased risk for frozen shoulder and swinging your arms can reduce this risk. 

## 2018-08-27 NOTE — Patient Instructions (Signed)
  You need to practice using your voice daily to keep it strong, just like you daily exercises to keep your body strong  After your loud "AH's" Read aloud for 5 minutes twice a day with loud strong power voice  Keep using the calendar and mini notebook to help you remember appointments and things you want to talk to people about

## 2018-08-28 ENCOUNTER — Ambulatory Visit: Payer: Medicare Other | Admitting: Occupational Therapy

## 2018-08-28 ENCOUNTER — Ambulatory Visit: Payer: Medicare Other | Admitting: Physical Therapy

## 2018-08-28 ENCOUNTER — Encounter: Payer: Self-pay | Admitting: Physical Therapy

## 2018-08-28 DIAGNOSIS — R41840 Attention and concentration deficit: Secondary | ICD-10-CM

## 2018-08-28 DIAGNOSIS — R2689 Other abnormalities of gait and mobility: Secondary | ICD-10-CM

## 2018-08-28 DIAGNOSIS — R29898 Other symptoms and signs involving the musculoskeletal system: Secondary | ICD-10-CM

## 2018-08-28 DIAGNOSIS — R29818 Other symptoms and signs involving the nervous system: Secondary | ICD-10-CM | POA: Diagnosis not present

## 2018-08-28 DIAGNOSIS — R278 Other lack of coordination: Secondary | ICD-10-CM

## 2018-08-28 DIAGNOSIS — R2681 Unsteadiness on feet: Secondary | ICD-10-CM | POA: Diagnosis not present

## 2018-08-28 NOTE — Therapy (Signed)
[facility] [address] [place], [place], [address] Phone: [phone]   Fax:  [phone]  Occupational Therapy Treatment  Patient Details  Name: [name] [name] MRN: [id number] Date of Birth: 08-15-1951 Referring Provider (OT): Dr. Carles Collet   Encounter Date: 08/28/2018  OT End of Session - 08/28/18 0948    Visit Number  10    Number of Visits  17    Date for OT Re-Evaluation  09/08/18    Authorization Type  Medicare and federal BCBS--75 visit limit combined OT, PT, ST for year    Authorization - Visit Number  10    Authorization - Number of Visits  12    OT Start Time  0933    OT Stop Time  1013    OT Time Calculation (min)  40 min    Activity Tolerance  Treatment limited secondary to medical complications (Comment)    Behavior During Therapy  Flat affect       Past Medical History:  Diagnosis Date  .  OSA (obstructive sleep apnea)    npsg 2012:  AHI 67/hr. Auto titration 2012:  Optimal pressure 12cm.   . APPENDECTOMY, HX OF 02/18/2008   Qualifier: Diagnosis of  By: [place], [place]    . Cardiac murmur    as a child  . Chronic idiopathic constipation   . Depression   . Headache(784.0)   . HEADACHES, HX OF 02/18/2008   Qualifier: Diagnosis of  By: Danny Lawless CMA, [place]    . Parkinson disease ([place]) 11/2014  . Sinus mucosal thickening    pt unable to lay flat  . Streptococcal meningitis    as an infant    Past Surgical History:  Procedure Laterality Date  . APPENDECTOMY  1967  . COLONOSCOPY    . NASAL SINUS SURGERY     x 4 as a child  . VASECTOMY      There were no vitals filed for this visit.  Subjective Assessment - 08/28/18 0946    Pertinent History  Parkinson's disease, seeing illusions, hx of falls, hx of significant depression, mild cognitive impairment, sleep apnea, hypogonadism, ADD    Patient Stated Goals  improved balance and ease with ADLs    Currently in Pain?  Yes    Pain Score  1      Pain Location  Back    Pain Type  Chronic pain    Pain Onset  More than a month ago    Pain Frequency  Intermittent    Aggravating Factors   unknown    Pain Relieving Factors  unknown              Treatment: HEP review see pt instructions, min v.c for large amplitude movements             OT Education - 08/28/18 1007    Education Details  coordination HEP review, min v.c for large amplitude movements, PWR! basic 4 seated, see pt instructions    Person(s) Educated  Patient    Methods  Explanation;Demonstration;Verbal cues;Handout    Comprehension  Verbalized understanding;Returned demonstration;Verbal cues required       OT Short Term Goals - 08/28/18 0949      OT SHORT TERM GOAL #1   Title  Pt will be independent with PD specific  HEP.--    Status  Achieved      OT SHORT TERM GOAL #2   Title  Pt will verbalize understanding of ways to  prevent future complications and current available/appropriate community resources prn.    Status  Achieved      OT SHORT TERM GOAL #3   Title  Pt will demonstrate improved fine motor coordination for ADLS as evidenced by decreasing LUE 9 hole peg test score to 47 secs or less.    Status  Achieved   33.68 secs     OT SHORT TERM GOAL #4   Title  Pt will report incr ease with donning shirt, pants socks/shoes using adaptive strategies/AE prn.    Status  Achieved        OT Long Term Goals - 08/28/18 0950      OT LONG TERM GOAL #1   Title  Pt will verbalize understanding of adapted strategies for ADLs/IADLs including strategies to minimize freezing episodes    Status  On-going      OT LONG TERM GOAL #2   Title  Pt will demonstrate improved ease with donning / doffing jacket as evidenced by decreasing PPT# 4 to 35 secs or less.    Status  Achieved            Plan - 08/28/18 1010    Clinical Impression Statement  Pt demonstrates good overall progress for the reporting period 07/10/18-08/28/18. See pt goals for  overall progress, anticipate d/c next week.    Occupational Profile and client history currently impacting functional performance  PMH that includes: seeing illusions, hx of falls, hx of significant depression, mild cognitive impairment, sleep apnea, hypogonadism, ADD. Pt reports recent falls and increased difficulty with orienting clothes to donn them.Pt retired due to difficulty at work and no longer drives. Pt reports slowing of ADLs and incr cognitive deficits impacting function.     Occupational performance deficits (Please refer to evaluation for details):  ADL's;IADL's;Leisure;Social Participation    Rehab Potential  Good    Current Impairments/barriers affecting progress:  cognitive deficits, depression, decreased balance, freezing episodes    OT Frequency  2x / week    OT Duration  8 weeks    OT Treatment/Interventions  Self-care/ADL training;Moist Heat;Fluidtherapy;Balance training;Therapeutic activities;Cognitive remediation/compensation;Therapeutic exercise;Ultrasound;Cryotherapy;Neuromuscular education;Visual/perceptual remediation/compensation;Passive range of motion;Functional Mobility Training;Patient/family education;Manual Therapy;Energy conservation;Paraffin;DME and/or AE instruction;Aquatic Therapy    Plan  work towards remaining unmet goals, Anticipate d/c next week       Patient will benefit from skilled therapeutic intervention in order to improve the following deficits and impairments:  Decreased cognition, Impaired flexibility, Decreased mobility, Decreased coordination, Decreased endurance, Decreased range of motion, Decreased strength, Impaired UE functional use, Impaired tone, Impaired perceived functional ability, Decreased safety awareness, Difficulty walking, Decreased balance, Decreased activity tolerance, Impaired vision/preception, Improper spinal/pelvic alignment  Visit Diagnosis: Other symptoms and signs involving the nervous system  Other symptoms and signs  involving the musculoskeletal system  Other lack of coordination  Attention and concentration deficit  Unsteadiness on feet    Problem List Patient Active Problem List   Diagnosis Date Noted  . Left foot pain 07/26/2018  . TMJ (temporomandibular joint syndrome) 10/10/2017  . Parkinson's disease ([place]) 03/20/2017  . Bilateral lower extremity edema 03/20/2017  . Chronic venous insufficiency 09/13/2016  . Fatigue 03/15/2016  . Parkinsonian features 09/15/2015  . ED (erectile dysfunction) 09/15/2015  . MDD (major depressive disorder), recurrent episode, severe ([place]) 02/10/2014  . Nonspecific abnormal electrocardiogram (ECG) (EKG) 02/07/2014  . Non-compliant behavior 02/03/2014  . Morton's neuroma of left foot 08/07/2013  . Attention deficit disorder without mention of hyperactivity 03/13/2012  . Hyperglycemia 01/26/2012  . Hypogonadism  male 01/24/2012  . Syncope and collapse 01/20/2012  .  OSA (obstructive sleep apnea) 01/17/2011  . CHEST TIGHTNESS 02/18/2008    , 08/28/2018, 5:56 PM Theone Murdoch, OTR/L [phone] Phone: [phone] 5:57 PM 08/28/18 [facility] [address] [place], [place], [address] Phone: [phone]   Fax:  [phone]  Name: JERIME ARIF MRN: [id number] Date of Birth: 10-16-1950

## 2018-08-28 NOTE — Therapy (Signed)
[facility] [address] [place], [place], [address] Phone: [phone]   Fax:  [phone]  Physical Therapy Treatment  Patient Details  Name: [name] [name] MRN: [id number] Date of Birth: 1951-02-27 Referring Provider (PT): Dr. Carles Collet   Encounter Date: 08/28/2018  This 10th visit progress note covers the period from 07/10/18 to 08/28/18.   PT End of Session - 08/28/18 0842    Visit Number  10    Number of Visits  13   corrected based on available visit coverage   Date for PT Re-Evaluation  10/08/18    Authorization Type  Medicare and [place] visits (each discipline counts as 1 visit); 08/09/18:  per count-36 visits used, 39 remain (PT, OT, speech to each use 12)    Authorization - Visit Number  10    Authorization - Number of Visits  12    PT Start Time  0845    PT Stop Time  0930    PT Time Calculation (min)  45 min    Activity Tolerance  Patient tolerated treatment well    Behavior During Therapy  Flat affect       Past Medical History:  Diagnosis Date  .  OSA (obstructive sleep apnea)    npsg 2012:  AHI 67/hr. Auto titration 2012:  Optimal pressure 12cm.   . APPENDECTOMY, HX OF 02/18/2008   Qualifier: Diagnosis of  By: [place], [place]    . Cardiac murmur    as a child  . Chronic idiopathic constipation   . Depression   . Headache(784.0)   . HEADACHES, HX OF 02/18/2008   Qualifier: Diagnosis of  By: Danny Lawless CMA, [place]    . Parkinson disease ([place]) 11/2014  . Sinus mucosal thickening    pt unable to lay flat  . Streptococcal meningitis    as an infant    Past Surgical History:  Procedure Laterality Date  . APPENDECTOMY  1967  . COLONOSCOPY    . NASAL SINUS SURGERY     x 4 as a child  . VASECTOMY      There were no vitals filed for this visit.  Subjective Assessment - 08/28/18 0842    Subjective  I brought the green folder. it has the plan for when I finish PT. No questions. Foot  is feeling better. New shoes are doing well.     Pertinent History  depression, Parkinson's disease    Patient Stated Goals  Pt's goals are to help with getting my foot better (explained this is a new injury and he would need to see MD specifically about that).  Also says to help with stiffness and shuffling.    Currently in Pain?  No/denies    Pain Onset  --         [facility] PT Assessment - 08/28/18 0849      Dynamic Gait Index   Level Surface  Mild Impairment    Change in Gait Speed  Moderate Impairment    Gait with Horizontal Head Turns  Normal    Gait with Vertical Head Turns  Mild Impairment    Gait and Pivot Turn  Normal    Step Over Obstacle  Normal    Step Around Obstacles  Normal    Steps  Mild Impairment    Total Score  19                   [facility] Adult PT Treatment/Exercise - 08/28/18 2119  Transfers   Floor to Transfer  5: Supervision;4: Min guard    Floor to Transfer Details (indicate cue type and reason)  to floor x1 with no furniture (squatted and reach to floor, then kneeling) to floor x 1 with one UE support on seat of chair; up from floor x1 no furniture and x1 with use of chair for support. Much more safe with use of chair which pt reports he uses when he goes to kick-boxing      Ambulation/Gait   Ambulation/Gait Assistance  5: Supervision    Ambulation/Gait Assistance Details  vc for step length, bil foot clearance; noted improved ankle stability in his new sneakers    Ambulation Distance (Feet)  100 Feet   80, 120   Assistive device  None   pt did not bring cane due to foot not hurting   Gait Pattern  Step-through pattern;Decreased arm swing - left;Decreased step length - right;Decreased step length - left;Trunk flexed;Poor foot clearance - left;Poor foot clearance - right      Knee/Hip Exercises: Aerobic   Stepper  SciFit Seated Stepper, Level 2, 4 extremities x 5 minutes, for warm-up, cues for increased intensity (RPM 80-90 for instensity)         [facility]) - 08/28/18 1731    PWR! exercises  Moves in standing    PWR! Up  5    PWR! Rock  10    Comments  vc for technique (supination with PWR up; wider BOS with rock and fully shifting onto LLE            PT Short Term Goals - 08/21/18 0920      PT SHORT TERM GOAL #1   Title  Pt will be independent with HEP to target Parkinson's specific deficits.  TARGET for all STGs 08/31/18 (due to delayed start for visits after eval)    Time  4    Period  Weeks    Status  Partially Met      PT SHORT TERM GOAL #2   Title  Pt will improve TUG score to less than or equal to 15 seconds for decreased fall risk.    Baseline  08/21/18 19.78 sec    Time  4    Period  Weeks    Status  Partially Met      PT SHORT TERM GOAL #3   Title  Pt will improve DGI score to at least 15/24 for decreased fall risk.    Baseline  08/21/18  18/24    Time  4    Period  Weeks    Status  Achieved      PT SHORT TERM GOAL #4   Title  Pt will report at least 50% improvement in car transfers.    Baseline  08/21/18 20% improvement    Time  4    Period  Weeks    Status  Partially Met      PT SHORT TERM GOAL #5   Title  Pt will verbalize understanding of fall prevention in home environment.    Baseline  08/21/18 education provided    Time  4    Period  Weeks    Status  On-going        PT Long Term Goals - 08/28/18 1735      PT LONG TERM GOAL #1   Title  Pt/wife will verbalize understanding of tips to reduce freezing with gait.  UPDATED TARGET for all LTGs 09/28/18--changed to 09/07/18 due  to insurance limitations    Time  8    Period  Weeks    Status  New      PT LONG TERM GOAL #2   Title  Pt will improve gait velocity score to at least 2 ft/sec for improved gait efficiency and safety.    Time  8    Period  Weeks    Status  New      PT LONG TERM GOAL #3   Title  Pt will improve DGI score to at least 19/24 for decreased fall risk.    Baseline  08/28/18  19/24    Time  8    Period   Weeks    Status  Achieved      PT LONG TERM GOAL #4   Title  Pt will improve TUG cognitive score to less than or equal to 20 seconds for decreased fall risk.    Time  8    Period  Weeks    Status  New            Plan - 08/28/18 1721    Clinical Impression Statement  Skilled session focused on: 1) safety with floor transfers for pt to be able to participate fully in his [facility] boxing class, 2) beginning to assess LTGs to assess progress, 3) focus on exercises that can specifically address Parkinson's limitations. Patient reports he is moving better than prior to PT sessions and will continue to benefit from PT over final sessions.     Rehab Potential  Good    Clinical Impairments Affecting Rehab Potential  Memory deficits per speech eval notes    PT Frequency  2x / week    PT Duration  8 weeks   plus eval   PT Treatment/Interventions  ADLs/Self Care Home Management;DME Instruction;Balance training;Therapeutic exercise;Therapeutic activities;Functional mobility training;Gait training;Neuromuscular re-education;Patient/family education    PT Next Visit Plan  Review standing PWR! Moves added to HEP 11/25 (only partially reviewed 11/26 due to time constraints); Begin looking at [place] and plans for d/c     Consulted and Agree with Plan of Care  Patient       Patient will benefit from skilled therapeutic intervention in order to improve the following deficits and impairments:  Abnormal gait, Decreased balance, Decreased mobility, Decreased coordination, Decreased strength, Difficulty walking, Impaired flexibility, Postural dysfunction  Visit Diagnosis: Other symptoms and signs involving the nervous system  Other abnormalities of gait and mobility     Problem List Patient Active Problem List   Diagnosis Date Noted  . Left foot pain 07/26/2018  . TMJ (temporomandibular joint syndrome) 10/10/2017  . Parkinson's disease ([place]) 03/20/2017  . Bilateral lower extremity edema  03/20/2017  . Chronic venous insufficiency 09/13/2016  . Fatigue 03/15/2016  . Parkinsonian features 09/15/2015  . ED (erectile dysfunction) 09/15/2015  . MDD (major depressive disorder), recurrent episode, severe ([place]) 02/10/2014  . Nonspecific abnormal electrocardiogram (ECG) (EKG) 02/07/2014  . Non-compliant behavior 02/03/2014  . Morton's neuroma of left foot 08/07/2013  . Attention deficit disorder without mention of hyperactivity 03/13/2012  . Hyperglycemia 01/26/2012  . Hypogonadism male 01/24/2012  . Syncope and collapse 01/20/2012  .  OSA (obstructive sleep apnea) 01/17/2011  . CHEST TIGHTNESS 02/18/2008    Rexanne Mano, PT 08/28/2018, 5:36 PM  [facility] [address] [place], [place], [address] Phone: [phone]   Fax:  [phone]  Name: [name] [name] MRN: [id number] Date of Birth: May 05, 1951

## 2018-08-28 NOTE — Patient Instructions (Addendum)
Coordination Exercises  Perform the following exercises for 20 minutes 1 times per day. Perform with both hand(s). Perform using big movements.  Flipping Cards: Place deck of cards on the table. Flip cards over by opening your hand big to grasp and then turn your palm up big. Deal cards: Hold 1/2 or whole deck in your hand. Use thumb to push card off top of deck with one big push. Rotate ball with fingertips: Pick up with fingers/thumb and move as much as you can with each turn/movement (clockwise and counter-clockwise). Pick up coins and place in coin bank or container: Pick up with big, intentional movements. Do not drag coin to the edge. Pick up coins and stack one at a time: Pick up with big, intentional movements. Do not drag coin to the edge. (5-10 in a stack) Pick up 5-10 coins one at a time and hold in palm. Then, move coins from palm to fingertips one at time and place in coin bank/container. Practice writing: Slow down, write big, and focus on forming each letter. Perform "Flicks"/hand stretches (PWR! Hands): Close hands then flick out your fingers with focus on opening hands, pulling wrists back, and extending elbows like you are pushing. 

## 2018-08-29 DIAGNOSIS — R351 Nocturia: Secondary | ICD-10-CM | POA: Diagnosis not present

## 2018-09-04 ENCOUNTER — Ambulatory Visit: Payer: Medicare Other | Admitting: Physical Therapy

## 2018-09-04 ENCOUNTER — Ambulatory Visit: Payer: Medicare Other | Attending: Neurology | Admitting: Occupational Therapy

## 2018-09-04 ENCOUNTER — Encounter: Payer: Self-pay | Admitting: Speech Pathology

## 2018-09-04 ENCOUNTER — Ambulatory Visit: Payer: Medicare Other | Admitting: Speech Pathology

## 2018-09-04 ENCOUNTER — Encounter: Payer: Self-pay | Admitting: Physical Therapy

## 2018-09-04 DIAGNOSIS — R471 Dysarthria and anarthria: Secondary | ICD-10-CM

## 2018-09-04 DIAGNOSIS — R293 Abnormal posture: Secondary | ICD-10-CM | POA: Insufficient documentation

## 2018-09-04 DIAGNOSIS — N3941 Urge incontinence: Secondary | ICD-10-CM | POA: Diagnosis not present

## 2018-09-04 DIAGNOSIS — R29898 Other symptoms and signs involving the musculoskeletal system: Secondary | ICD-10-CM | POA: Diagnosis not present

## 2018-09-04 DIAGNOSIS — R41840 Attention and concentration deficit: Secondary | ICD-10-CM | POA: Diagnosis not present

## 2018-09-04 DIAGNOSIS — R2689 Other abnormalities of gait and mobility: Secondary | ICD-10-CM

## 2018-09-04 DIAGNOSIS — R29818 Other symptoms and signs involving the nervous system: Secondary | ICD-10-CM | POA: Diagnosis not present

## 2018-09-04 DIAGNOSIS — R41841 Cognitive communication deficit: Secondary | ICD-10-CM | POA: Insufficient documentation

## 2018-09-04 DIAGNOSIS — R2681 Unsteadiness on feet: Secondary | ICD-10-CM | POA: Insufficient documentation

## 2018-09-04 DIAGNOSIS — R278 Other lack of coordination: Secondary | ICD-10-CM | POA: Insufficient documentation

## 2018-09-04 NOTE — Therapy (Signed)
[facility] [address] [place], [place], [address] Phone: [phone]   Fax:  [phone]  Speech Language Pathology Treatment  Patient Details  Name: [name] [name] MRN: [id number] Date of Birth: May 05, 1951 Referring Provider (SLP): Alonza Bogus, DO   Encounter Date: 09/04/2018  End of Session - 09/04/18 1124    Visit Number  10    Number of Visits  13    Date for SLP Re-Evaluation  09/07/18    Authorization Type  extended date for re-eval as pt initiated ST 2 weeks after eval (he had 1 session 1st week ST started)    SLP Start Time  0847    SLP Stop Time   0929    SLP Time Calculation (min)  42 min       Past Medical History:  Diagnosis Date  .  OSA (obstructive sleep apnea)    npsg 2012:  AHI 67/hr. Auto titration 2012:  Optimal pressure 12cm.   . APPENDECTOMY, HX OF 02/18/2008   Qualifier: Diagnosis of  By: [place], [place]    . Cardiac murmur    as a child  . Chronic idiopathic constipation   . Depression   . Headache(784.0)   . HEADACHES, HX OF 02/18/2008   Qualifier: Diagnosis of  By: Danny Lawless CMA, [place]    . Parkinson disease ([place]) 11/2014  . Sinus mucosal thickening    pt unable to lay flat  . Streptococcal meningitis    as an infant    Past Surgical History:  Procedure Laterality Date  . APPENDECTOMY  1967  . COLONOSCOPY    . NASAL SINUS SURGERY     x 4 as a child  . VASECTOMY      There were no vitals filed for this visit.  Subjective Assessment - 09/04/18 0851    Subjective  "I left my cane in the car"    Currently in Pain?  No/denies            ADULT SLP TREATMENT - 09/04/18 0854      General Information   Behavior/Cognition  Alert;Cooperative;Pleasant mood      Treatment Provided   Treatment provided  Cognitive-Linquistic      Pain Assessment   Pain Assessment  No/denies pain      Cognitive-Linquistic Treatment   Treatment focused on  Dysarthria;Cognition     Skilled Treatment  Pt entered room with hoarse voice - loud /a/ to recalibrate volume and reduce hoarseness with average of 88dB. Pt reported his next appointments accurately as he had checked his home calendar this morning. Pt continues to require extended time for responding in conversation, however attention is adequate for simple to mildly complex conversation when extra time given, in quiet room with rare min A.      Assessment / Recommendations / Plan   Plan  Continue with current plan of care      Progression Toward Goals   Progression toward goals  Not progressing toward goals (comment)   inconsistent carryover of HEP     .Marland KitchenSpeech Therapy Progress Note  Dates of Reporting Period: 07/10/18 to 09/04/18    SLP Short Term Goals - 09/04/18 1123      SLP SHORT TERM GOAL #1   Title  Pt will carryover 2 compensations for attention/memory at home to complete daily chores/tasks with occasional min A over 2 sessions    Baseline  08/14/18; 08/16/18    Time  1    Period  Weeks    Status  Achieved      SLP SHORT TERM GOAL #2   Title  Pt will report carryover of compensations for slow processing in coversations (comprehension and expression) with occasional min A over 2 sessions    Baseline  08/21/18;     Time  1    Period  Weeks    Status  Partially Met      SLP SHORT TERM GOAL #3   Title  Pt will utilize compensations for dysarthria in structured tasks with occasional min A in three sessions    Baseline  08/14/18; 08/16/18; 08/21/18    Time  21    Period  Weeks    Status  Achieved      SLP SHORT TERM GOAL #4   Title  Formal cognitive linguistic assessment as indicated    Time  2    Period  Weeks    Status  Deferred       SLP Long Term Goals - 09/04/18 1123      SLP LONG TERM GOAL #1   Title  pt will demo attention skills adequate for functional verbal expression in 10 minutes simple-mod complex conversation over two sessions    Baseline  08/27/18; 09/04/18    Time  2     Period  Weeks    Status  On-going      SLP LONG TERM GOAL #2   Title  Pt will be intelligible in 15 minute converation in mildly noisy environment with occasional min A over 2 sessions    Baseline  08/27/18; 09/04/18    Time  2    Period  Weeks    Status  Achieved      SLP LONG TERM GOAL #3   Title  Pt will report 25% reduction (subjectively) in requests for repetition or being told he is mumbling by his spouse over 3 sessions    Time  1    Status  On-going      SLP LONG TERM GOAL #4   Title  Pt will reports carryover of environmental compensations for improved intelligibility outside of therapy over 2 sessions    Baseline  09/04/18    Time  1    Period  Weeks    Status  On-going       Plan - 09/04/18 1122    Clinical Impression Statement  Pt continues with inconsistent f/u with HEP and cognitive compensations for conversation (modified scripting, pre-practice). With occasional min A for overarticulation and volume, pt intelligible in mildly noisy environment. Continue skilled ST 1 more visits to maximize carryover for dysarthria and cognition for improved QOL and to reduce caregiver burden       Patient will benefit from skilled therapeutic intervention in order to improve the following deficits and impairments:   Dysarthria and anarthria  Cognitive communication deficit    Problem List Patient Active Problem List   Diagnosis Date Noted  . Left foot pain 07/26/2018  . TMJ (temporomandibular joint syndrome) 10/10/2017  . Parkinson's disease ([place]) 03/20/2017  . Bilateral lower extremity edema 03/20/2017  . Chronic venous insufficiency 09/13/2016  . Fatigue 03/15/2016  . Parkinsonian features 09/15/2015  . ED (erectile dysfunction) 09/15/2015  . MDD (major depressive disorder), recurrent episode, severe ([place]) 02/10/2014  . Nonspecific abnormal electrocardiogram (ECG) (EKG) 02/07/2014  . Non-compliant behavior 02/03/2014  . Morton's neuroma of left foot 08/07/2013  .  Attention deficit disorder without mention of hyperactivity 03/13/2012  . Hyperglycemia 01/26/2012  .  Hypogonadism male 01/24/2012  . Syncope and collapse 01/20/2012  .  OSA (obstructive sleep apnea) 01/17/2011  . CHEST TIGHTNESS 02/18/2008    Chanson Teems, Annye Rusk MS, CCC-SLP 09/04/2018, 11:25 AM  [facility] [address] [place], [place], [address] Phone: [phone]   Fax:  [phone]   Name: [name] [name] MRN: [id number] Date of Birth: [birth year]

## 2018-09-04 NOTE — Therapy (Signed)
[facility] [address] [place], [place], [address] Phone: [phone]   Fax:  [phone]  Occupational Therapy Treatment  Patient Details  Name: [name] [name] MRN: [id number] Date of Birth: 04/07/1951 Referring Provider (OT): Dr. Carles Collet   Encounter Date: 09/04/2018  OT End of Session - 09/04/18 0959    Visit Number  11    Number of Visits  17    Date for OT Re-Evaluation  09/08/18    Authorization Type  Medicare and federal [employer]--75 visit limit combined OT, PT, ST for year    Authorization Time Period  anticipate seeing pt for approx 6 weeks -12 visits due to visit limit    Authorization - Visit Number  11    Authorization - Number of Visits  12    OT Start Time  0933    OT Stop Time  1015    OT Time Calculation (min)  42 min       Past Medical History:  Diagnosis Date  .  OSA (obstructive sleep apnea)    npsg 2012:  AHI 67/hr. Auto titration 2012:  Optimal pressure 12cm.   . APPENDECTOMY, HX OF 02/18/2008   Qualifier: Diagnosis of  By: [place], [place]    . Cardiac murmur    as a child  . Chronic idiopathic constipation   . Depression   . Headache(784.0)   . HEADACHES, HX OF 02/18/2008   Qualifier: Diagnosis of  By: Danny Lawless CMA, [place]    . Parkinson disease ([place]) 11/2014  . Sinus mucosal thickening    pt unable to lay flat  . Streptococcal meningitis    as an infant    Past Surgical History:  Procedure Laterality Date  . APPENDECTOMY  1967  . COLONOSCOPY    . NASAL SINUS SURGERY     x 4 as a child  . VASECTOMY      There were no vitals filed for this visit.  Subjective Assessment - 09/04/18 0952    Pertinent History  Parkinson's disease, seeing illusions, hx of falls, hx of significant depression, mild cognitive impairment, sleep apnea, hypogonadism, ADD    Patient Stated Goals  improved balance and ease with ADLs    Currently in Pain?  No/denies                   Treatment: Dynamic step and reach to place small pegs in pegboard to copy design, min difficulty, v.c for posture and larger amplitude movements. Arm bike x 6 mins level 1 for conditioning, pt was able to maintain 40 rpm. Therapist re-issued  stretches for dressing.         OT Education - 09/04/18 [phone]    Education Details  Reviewed stretches for dressing-see pt instructions    Person(s) Educated  Patient    Methods  Explanation;Demonstration;Verbal cues;Handout    Comprehension  Verbalized understanding;Returned demonstration;Verbal cues required       OT Short Term Goals - 08/28/18 0949      OT SHORT TERM GOAL #1   Title  Pt will be independent with PD specific  HEP.--    Status  Achieved      OT SHORT TERM GOAL #2   Title  Pt will verbalize understanding of ways to prevent future complications and current available/appropriate community resources prn.    Status  Achieved      OT SHORT TERM GOAL #3   Title  Pt will demonstrate improved fine motor coordination  for ADLS as evidenced by decreasing LUE 9 hole peg test score to 47 secs or less.    Status  Achieved   33.68 secs     OT SHORT TERM GOAL #4   Title  Pt will report incr ease with donning shirt, pants socks/shoes using adaptive strategies/AE prn.    Status  Achieved        OT Long Term Goals - 08/28/18 0950      OT LONG TERM GOAL #1   Title  Pt will verbalize understanding of adapted strategies for ADLs/IADLs including strategies to minimize freezing episodes    Status  On-going      OT LONG TERM GOAL #2   Title  Pt will demonstrate improved ease with donning / doffing jacket as evidenced by decreasing PPT# 4 to 35 secs or less.    Status  Achieved            Plan - 09/04/18 1003    Clinical Impression Statement  Pt is progressing towards goals. He demonstrates understanding of stretches following review. Therapist encouraged pt to perform before dressing for increased ease.     Occupational Profile and client history currently impacting functional performance  PMH that includes: seeing illusions, hx of falls, hx of significant depression, mild cognitive impairment, sleep apnea, hypogonadism, ADD. Pt reports recent falls and increased difficulty with orienting clothes to donn them.Pt retired due to difficulty at work and no longer drives. Pt reports slowing of ADLs and incr cognitive deficits impacting function.     Occupational performance deficits (Please refer to evaluation for details):  ADL's;IADL's;Leisure;Social Participation    Rehab Potential  Good    Current Impairments/barriers affecting progress:  cognitive deficits, depression, decreased balance, freezing episodes    OT Frequency  2x / week    OT Duration  8 weeks    OT Treatment/Interventions  Self-care/ADL training;Moist Heat;Fluidtherapy;Balance training;Therapeutic activities;Cognitive remediation/compensation;Therapeutic exercise;Ultrasound;Cryotherapy;Neuromuscular education;Visual/perceptual remediation/compensation;Passive range of motion;Functional Mobility Training;Patient/family education;Manual Therapy;Energy conservation;Paraffin;DME and/or AE instruction;Aquatic Therapy    Plan  work towards remaining unmet goals, Anticipate d/c next visit    Consulted and Agree with Plan of Care  Patient       Patient will benefit from skilled therapeutic intervention in order to improve the following deficits and impairments:  Decreased cognition, Impaired flexibility, Decreased mobility, Decreased coordination, Decreased endurance, Decreased range of motion, Decreased strength, Impaired UE functional use, Impaired tone, Impaired perceived functional ability, Decreased safety awareness, Difficulty walking, Decreased balance, Decreased activity tolerance, Impaired vision/preception, Improper spinal/pelvic alignment  Visit Diagnosis: Other symptoms and signs involving the nervous system  Other  abnormalities of gait and mobility  Other symptoms and signs involving the musculoskeletal system  Other lack of coordination  Attention and concentration deficit  Unsteadiness on feet    Problem List Patient Active Problem List   Diagnosis Date Noted  . Left foot pain 07/26/2018  . TMJ (temporomandibular joint syndrome) 10/10/2017  . Parkinson's disease ([place]) 03/20/2017  . Bilateral lower extremity edema 03/20/2017  . Chronic venous insufficiency 09/13/2016  . Fatigue 03/15/2016  . Parkinsonian features 09/15/2015  . ED (erectile dysfunction) 09/15/2015  . MDD (major depressive disorder), recurrent episode, severe ([place]) 02/10/2014  . Nonspecific abnormal electrocardiogram (ECG) (EKG) 02/07/2014  . Non-compliant behavior 02/03/2014  . Morton's neuroma of left foot 08/07/2013  . Attention deficit disorder without mention of hyperactivity 03/13/2012  . Hyperglycemia 01/26/2012  . Hypogonadism male 01/24/2012  . Syncope and collapse 01/20/2012  .  OSA (obstructive sleep apnea)  01/17/2011  . CHEST TIGHTNESS 02/18/2008    RINE,KATHRYN 09/04/2018, 10:05 AM  [facility] [address] [place], [place], [address] Phone: [phone]   Fax:  [phone]  Name: [name] [name] MRN: [id number] Date of Birth: Feb 06, 1951

## 2018-09-04 NOTE — Patient Instructions (Signed)
Stretches before dressing: 1. While seated, Reach for the floor 3-5 x, do not hold your breath   2. Seated, reach for each ankle 3x  3. Cross one leg across your other knee hold 5 secs, perform 3x for each leg 4.Holding bag in both hands raise arms behind head as if putting on a shirt 10 reps  5. Holding a balled up plastic bag pass the bag behind your back between your hands 10x 6.  PWR! Hands:Start with elbows bent and hands closed, Push hands out BIG. Elbows straight, wrists up, fingers open and spread apart BIG. High 5's. x15

## 2018-09-04 NOTE — Therapy (Signed)
[facility] [address] [place], [place], [address] Phone: [phone]   Fax:  [phone]  Physical Therapy Treatment  Patient Details  Name: [name] [name] MRN: [id number] Date of Birth: 1950/10/25 Referring Provider (PT): Dr. Carles Collet   Encounter Date: 09/04/2018  PT End of Session - 09/04/18 1525    Visit Number  11    Number of Visits  13   corrected based on available visit coverage   Date for PT Re-Evaluation  10/08/18    Authorization Type  Medicare and [place] visits (each discipline counts as 1 visit); 08/09/18:  per count-36 visits used, 39 remain (PT, OT, speech to each use 12)    Authorization - Visit Number  11    Authorization - Number of Visits  12    PT Start Time  1024    PT Stop Time  1103    PT Time Calculation (min)  39 min    Activity Tolerance  Patient tolerated treatment well    Behavior During Therapy  Flat affect       Past Medical History:  Diagnosis Date  .  OSA (obstructive sleep apnea)    npsg 2012:  AHI 67/hr. Auto titration 2012:  Optimal pressure 12cm.   . APPENDECTOMY, HX OF 02/18/2008   Qualifier: Diagnosis of  By: [place], [place]    . Cardiac murmur    as a child  . Chronic idiopathic constipation   . Depression   . Headache(784.0)   . HEADACHES, HX OF 02/18/2008   Qualifier: Diagnosis of  By: Danny Lawless CMA, [place]    . Parkinson disease ([place]) 11/2014  . Sinus mucosal thickening    pt unable to lay flat  . Streptococcal meningitis    as an infant    Past Surgical History:  Procedure Laterality Date  . APPENDECTOMY  1967  . COLONOSCOPY    . NASAL SINUS SURGERY     x 4 as a child  . VASECTOMY      There were no vitals filed for this visit.  Subjective Assessment - 09/04/18 1027    Subjective  No changes, I think this is my last week.    Pertinent History  depression, Parkinson's disease    Patient Stated Goals  Pt's goals are to help with getting my  foot better (explained this is a new injury and he would need to see MD specifically about that).  Also says to help with stiffness and shuffling.    Currently in Pain?  No/denies                       [facility] Adult PT Treatment/Exercise - 09/04/18 0001      Transfers   Floor to Transfer  5: Supervision;4: Min guard    Floor to Transfer Details (indicate cue type and reason)  Practiced floor to stand transfer with chair or mat support as safest way to get down to and up from floor.  Practiced 3 reps.  Practiced mobility on floor-getting up from 1) prone position>sidelying>half kneel and up to stand with use of chair for support, 2) prone position to quadruped>1/2 kneel to stand with UE support, then 3) supine>sidelying>quadruped>1/2 kneel to stand with UE support.    Transfer Cueing  Cues for use of rocking, momentum to initiate position changes on floor.      Ambulation/Gait   Ambulation/Gait  Yes    Ambulation/Gait Assistance  5: Supervision  Ambulation/Gait Assistance Details  Practiced turns from stopped position, with step-together, step turns.  Pt needs repeated cues for this change of direction technique.    Ambulation Distance (Feet)  100 Feet   x 4,, 50 ft x 2   Assistive device  None   pt forgot his cane today   Gait Pattern  Step-through pattern;Decreased arm swing - left;Decreased step length - right;Decreased step length - left;Trunk flexed;Poor foot clearance - left;Poor foot clearance - right    Ambulation Surface  Level;Indoor        [facility]) - 09/04/18 1521    PWR! exercises  Moves in standing    PWR! Up  PWR! Up x 20    PWR! [employer]! Rock x 10 each side   Cues to shift weight through hips   PWR! Twist  PWR! Twist x 10 reps each side    PWR Step  PWR! Step x 10 reps each side    Comments  VC for technique, for rocking/weightshifting and for hand coordination with rocking and stepping          PT Education - 09/04/18 1524    Education  Details  Discussed plans for discharge next visit, with pt agreeable to return PT eval in 6 months    Person(s) Educated  Patient    Methods  Explanation    Comprehension  Verbalized understanding       PT Short Term Goals - 08/21/18 0920      PT SHORT TERM GOAL #1   Title  Pt will be independent with HEP to target Parkinson's specific deficits.  TARGET for all STGs 08/31/18 (due to delayed start for visits after eval)    Time  4    Period  Weeks    Status  Partially Met      PT SHORT TERM GOAL #2   Title  Pt will improve TUG score to less than or equal to 15 seconds for decreased fall risk.    Baseline  08/21/18 19.78 sec    Time  4    Period  Weeks    Status  Partially Met      PT SHORT TERM GOAL #3   Title  Pt will improve DGI score to at least 15/24 for decreased fall risk.    Baseline  08/21/18  18/24    Time  4    Period  Weeks    Status  Achieved      PT SHORT TERM GOAL #4   Title  Pt will report at least 50% improvement in car transfers.    Baseline  08/21/18 20% improvement    Time  4    Period  Weeks    Status  Partially Met      PT SHORT TERM GOAL #5   Title  Pt will verbalize understanding of fall prevention in home environment.    Baseline  08/21/18 education provided    Time  4    Period  Weeks    Status  On-going        PT Long Term Goals - 08/28/18 1735      PT LONG TERM GOAL #1   Title  Pt/wife will verbalize understanding of tips to reduce freezing with gait.  UPDATED TARGET for all LTGs 09/28/18--changed to 09/07/18 due to insurance limitations    Time  8    Period  Weeks    Status  New      PT LONG  TERM GOAL #2   Title  Pt will improve gait velocity score to at least 2 ft/sec for improved gait efficiency and safety.    Time  8    Period  Weeks    Status  New      PT LONG TERM GOAL #3   Title  Pt will improve DGI score to at least 19/24 for decreased fall risk.    Baseline  08/28/18  19/24    Time  8    Period  Weeks    Status   Achieved      PT LONG TERM GOAL #4   Title  Pt will improve TUG cognitive score to less than or equal to 20 seconds for decreased fall risk.    Time  8    Period  Weeks    Status  New            Plan - 09/04/18 1525    Clinical Impression Statement  Skilled PT session today focused on review and instruction on floor transfers, with floor mobility to initiate transfers; review of his PWR! Moves exercises, gait and transition stepping for improved turns.  Reviewed cues/strategies to reduce freezing episodes, which pt is able to verbalize, but he needs cues to initiate strategies with standing and gait activities.    Rehab Potential  Good    Clinical Impairments Affecting Rehab Potential  Memory deficits per speech eval notes    PT Frequency  2x / week    PT Duration  8 weeks   plus eval   PT Treatment/Interventions  ADLs/Self Care Home Management;DME Instruction;Balance training;Therapeutic exercise;Therapeutic activities;Functional mobility training;Gait training;Neuromuscular re-education;Patient/family education    PT Next Visit Plan  Check remaining LTGs and plan for d/c next visit; schedule return PT eval in 6 months    Consulted and Agree with Plan of Care  Patient       Patient will benefit from skilled therapeutic intervention in order to improve the following deficits and impairments:  Abnormal gait, Decreased balance, Decreased mobility, Decreased coordination, Decreased strength, Difficulty walking, Impaired flexibility, Postural dysfunction  Visit Diagnosis: Unsteadiness on feet  Abnormal posture  Other abnormalities of gait and mobility     Problem List Patient Active Problem List   Diagnosis Date Noted  . Left foot pain 07/26/2018  . TMJ (temporomandibular joint syndrome) 10/10/2017  . Parkinson's disease ([place]) 03/20/2017  . Bilateral lower extremity edema 03/20/2017  . Chronic venous insufficiency 09/13/2016  . Fatigue 03/15/2016  . Parkinsonian features  09/15/2015  . ED (erectile dysfunction) 09/15/2015  . MDD (major depressive disorder), recurrent episode, severe ([place]) 02/10/2014  . Nonspecific abnormal electrocardiogram (ECG) (EKG) 02/07/2014  . Non-compliant behavior 02/03/2014  . Morton's neuroma of left foot 08/07/2013  . Attention deficit disorder without mention of hyperactivity 03/13/2012  . Hyperglycemia 01/26/2012  . Hypogonadism male 01/24/2012  . Syncope and collapse 01/20/2012  .  OSA (obstructive sleep apnea) 01/17/2011  . CHEST TIGHTNESS 02/18/2008    Manuelita Moxon W. 09/04/2018, 3:29 PM  Frazier Butt., PT  [facility] [address] [place], [place], [address] Phone: [phone]   Fax:  [phone]  Name: [name] [name] MRN: [id number] Date of Birth: [birth year]

## 2018-09-06 ENCOUNTER — Ambulatory Visit: Payer: Medicare Other | Admitting: Occupational Therapy

## 2018-09-06 ENCOUNTER — Encounter: Payer: Self-pay | Admitting: Speech Pathology

## 2018-09-06 ENCOUNTER — Ambulatory Visit: Payer: Medicare Other | Admitting: Speech Pathology

## 2018-09-06 ENCOUNTER — Encounter: Payer: Self-pay | Admitting: Physical Therapy

## 2018-09-06 ENCOUNTER — Ambulatory Visit: Payer: Medicare Other | Admitting: Physical Therapy

## 2018-09-06 DIAGNOSIS — R2689 Other abnormalities of gait and mobility: Secondary | ICD-10-CM

## 2018-09-06 DIAGNOSIS — R471 Dysarthria and anarthria: Secondary | ICD-10-CM

## 2018-09-06 DIAGNOSIS — R2681 Unsteadiness on feet: Secondary | ICD-10-CM

## 2018-09-06 DIAGNOSIS — R29898 Other symptoms and signs involving the musculoskeletal system: Secondary | ICD-10-CM | POA: Diagnosis not present

## 2018-09-06 DIAGNOSIS — R41841 Cognitive communication deficit: Secondary | ICD-10-CM

## 2018-09-06 DIAGNOSIS — R278 Other lack of coordination: Secondary | ICD-10-CM

## 2018-09-06 DIAGNOSIS — R41840 Attention and concentration deficit: Secondary | ICD-10-CM | POA: Diagnosis not present

## 2018-09-06 DIAGNOSIS — R29818 Other symptoms and signs involving the nervous system: Secondary | ICD-10-CM

## 2018-09-06 DIAGNOSIS — R293 Abnormal posture: Secondary | ICD-10-CM

## 2018-09-06 NOTE — Therapy (Signed)
[facility] [address] [place], [place], [address] Phone: [phone]   Fax:  [phone]  Occupational Therapy Treatment  Patient Details  Name: [name] [name] MRN: [id number] Date of Birth: [birth year] Referring Provider (OT): Dr. Carles Collet   Encounter Date: 09/06/2018  OT End of Session - 09/06/18 1505    Visit Number  12    Number of Visits  17    Date for OT Re-Evaluation  09/08/18    Authorization Type  Medicare and federal [employer]--75 visit limit combined OT, PT, ST for year    Authorization Time Period  anticipate seeing pt for approx 6 weeks -12 visits due to visit limit    Authorization - Visit Number  12    Authorization - Number of Visits  12    OT Start Time  1450    OT Stop Time  1530    OT Time Calculation (min)  40 min    Activity Tolerance  Patient tolerated treatment well    Behavior During Therapy  Flat affect       Past Medical History:  Diagnosis Date  .  OSA (obstructive sleep apnea)    npsg 2012:  AHI 67/hr. Auto titration 2012:  Optimal pressure 12cm.   . APPENDECTOMY, HX OF 02/18/2008   Qualifier: Diagnosis of  By: [place], [place]    . Cardiac murmur    as a child  . Chronic idiopathic constipation   . Depression   . Headache(784.0)   . HEADACHES, HX OF 02/18/2008   Qualifier: Diagnosis of  By: Danny Lawless CMA, [place]    . Parkinson disease ([place]) 11/2014  . Sinus mucosal thickening    pt unable to lay flat  . Streptococcal meningitis    as an infant    Past Surgical History:  Procedure Laterality Date  . APPENDECTOMY  1967  . COLONOSCOPY    . NASAL SINUS SURGERY     x 4 as a child  . VASECTOMY      There were no vitals filed for this visit.  Subjective Assessment - 09/06/18 1452    Subjective   Pt agrees with plans to discharge from therapy    Pertinent History  Parkinson's disease, seeing illusions, hx of falls, hx of significant depression, mild cognitive impairment, sleep  apnea, hypogonadism, ADD    Patient Stated Goals  improved balance and ease with ADLs    Currently in Pain?  Yes             Treatment: PWR! modified quadraped, PWR! up and rock x 10 reps followed by functional overhead reaching  with bilateral UE's , min vc. for posture and large amplitude movements. Arm bike x 5 mins level 1 for conditioning, pt maintined 40 rpm Flicking playing cards with right and left UE's for increased finger extension, min v.c for large amplitude movements Discussed strategies for dealing with freezing episodes, pt verbalized understanding and checked remaining goal.                   OT Short Term Goals - 08/28/18 0949      OT SHORT TERM GOAL #1   Title  Pt will be independent with PD specific  HEP.--    Status  Achieved      OT SHORT TERM GOAL #2   Title  Pt will verbalize understanding of ways to prevent future complications and current available/appropriate community resources prn.    Status  Achieved  OT SHORT TERM GOAL #3   Title  Pt will demonstrate improved fine motor coordination for ADLS as evidenced by decreasing LUE 9 hole peg test score to 47 secs or less.    Status  Achieved   33.68 secs     OT SHORT TERM GOAL #4   Title  Pt will report incr ease with donning shirt, pants socks/shoes using adaptive strategies/AE prn.    Status  Achieved        OT Long Term Goals - 09/06/18 1507      OT LONG TERM GOAL #1   Title  Pt will verbalize understanding of adapted strategies for ADLs/IADLs including strategies to minimize freezing episodes    Status  Achieved      OT LONG TERM GOAL #2   Title  Pt will demonstrate improved ease with donning / doffing jacket as evidenced by decreasing PPT# 4 to 35 secs or less.    Status  Achieved            Plan - 09/06/18 1507    Clinical Impression Statement  Pt agrees with plans for d/c. Pt states he would like to retrun for a re-eval in 6 mons.    Occupational Profile and  client history currently impacting functional performance  PMH that includes: seeing illusions, hx of falls, hx of significant depression, mild cognitive impairment, sleep apnea, hypogonadism, ADD. Pt reports recent falls and increased difficulty with orienting clothes to donn them.Pt retired due to difficulty at work and no longer drives. Pt reports slowing of ADLs and incr cognitive deficits impacting function.     Occupational performance deficits (Please refer to evaluation for details):  ADL's;IADL's;Leisure;Social Participation    Rehab Potential  Good    Current Impairments/barriers affecting progress:  cognitive deficits, depression, decreased balance, freezing episodes    OT Frequency  2x / week    OT Duration  8 weeks    OT Treatment/Interventions  Self-care/ADL training;Moist Heat;Fluidtherapy;Balance training;Therapeutic activities;Cognitive remediation/compensation;Therapeutic exercise;Ultrasound;Cryotherapy;Neuromuscular education;Visual/perceptual remediation/compensation;Passive range of motion;Functional Mobility Training;Patient/family education;Manual Therapy;Energy conservation;Paraffin;DME and/or AE instruction;Aquatic Therapy    Plan  d/c OT, pt can benefit from a re-eval in 6 mons due to the progressive nature of PD.    Consulted and Agree with Plan of Care  Patient     OCCUPATIONAL THERAPY DISCHARGE SUMMARY   Current functional level related to goals / functional outcomes: Pt made good overall progress, see progress towards goals.   Remaining deficits: Bradykinesia, decreased coordination, decreased balance, rigidity, freezing episodes   Education / Equipment: Pt was educated regarding HEP, and adapted strategies for ADLs, ways to prevent future complications and community resources. Pt demonstrates understanding of all education. Plan: Patient agrees to discharge.  Patient goals were met. Patient is being discharged due to meeting the stated rehab goals.  ?????       Patient will benefit from skilled therapeutic intervention in order to improve the following deficits and impairments:  Decreased cognition, Impaired flexibility, Decreased mobility, Decreased coordination, Decreased endurance, Decreased range of motion, Decreased strength, Impaired UE functional use, Impaired tone, Impaired perceived functional ability, Decreased safety awareness, Difficulty walking, Decreased balance, Decreased activity tolerance, Impaired vision/preception, Improper spinal/pelvic alignment  Visit Diagnosis: Unsteadiness on feet  Abnormal posture  Other abnormalities of gait and mobility    Problem List Patient Active Problem List   Diagnosis Date Noted  . Left foot pain 07/26/2018  . TMJ (temporomandibular joint syndrome) 10/10/2017  . Parkinson's disease ([place]) 03/20/2017  . Bilateral lower  extremity edema 03/20/2017  . Chronic venous insufficiency 09/13/2016  . Fatigue 03/15/2016  . Parkinsonian features 09/15/2015  . ED (erectile dysfunction) 09/15/2015  . MDD (major depressive disorder), recurrent episode, severe ([place]) 02/10/2014  . Nonspecific abnormal electrocardiogram (ECG) (EKG) 02/07/2014  . Non-compliant behavior 02/03/2014  . Morton's neuroma of left foot 08/07/2013  . Attention deficit disorder without mention of hyperactivity 03/13/2012  . Hyperglycemia 01/26/2012  . Hypogonadism male 01/24/2012  . Syncope and collapse 01/20/2012  .  OSA (obstructive sleep apnea) 01/17/2011  . CHEST TIGHTNESS 02/18/2008    Navdeep Halt 09/06/2018, 3:26 PM Theone Murdoch, OTR/L [phone] Phone: [phone] 3:27 PM 09/06/18 [facility] [address] [place], [place], [address] Phone: [phone]   Fax:  [phone]  Name: [name] [name] MRN: [id number] Date of Birth: [birth year]

## 2018-09-06 NOTE — Therapy (Signed)
[facility] [address] [place], [place], [address] Phone: [phone]   Fax:  [phone]  Speech Language Pathology Treatment & Discharge Summary  Patient Details  Name: [name] [name] MRN: [id number] Date of Birth: Dec 08, 1950 Referring Provider (SLP): Alonza Bogus, DO   Encounter Date: 09/06/2018  End of Session - 09/06/18 1502    Visit Number  11    Number of Visits  13    Date for SLP Re-Evaluation  09/07/18    SLP Start Time  27    SLP Stop Time   1443    SLP Time Calculation (min)  41 min    Activity Tolerance  Patient tolerated treatment well       Past Medical History:  Diagnosis Date  .  OSA (obstructive sleep apnea)    npsg 2012:  AHI 67/hr. Auto titration 2012:  Optimal pressure 12cm.   . APPENDECTOMY, HX OF 02/18/2008   Qualifier: Diagnosis of  By: [place], [place]    . Cardiac murmur    as a child  . Chronic idiopathic constipation   . Depression   . Headache(784.0)   . HEADACHES, HX OF 02/18/2008   Qualifier: Diagnosis of  By: Danny Lawless CMA, [place]    . Parkinson disease ([place]) 11/2014  . Sinus mucosal thickening    pt unable to lay flat  . Streptococcal meningitis    as an infant    Past Surgical History:  Procedure Laterality Date  . APPENDECTOMY  1967  . COLONOSCOPY    . NASAL SINUS SURGERY     x 4 as a child  . VASECTOMY      There were no vitals filed for this visit.         ADULT SLP TREATMENT - 09/06/18 1425      General Information   Behavior/Cognition  Alert;Cooperative;Pleasant mood      Treatment Provided   Treatment provided  Cognitive-Linquistic      Pain Assessment   Pain Assessment  No/denies pain      Cognitive-Linquistic Treatment   Treatment focused on  Dysarthria;Cognition    Skilled Treatment  Clear phonation upon entering room. Pt reports spouse has decreased her requests for pt to repeat himself or get louder by 50% since starting this  course of ST. He reports utilizing large wall calendar consistently to recall/manage appointments and schedule.       Assessment / Recommendations / Plan   Plan  Discharge SLP treatment due to (comment)      Progression Toward Goals   Progression toward goals  Goals met, education completed, patient discharged from [facility] Education - 09/06/18 1449    Education provided  Yes    Education Details  daily loud /a/ 5x twice (5/7 days) followed by 10 minutes of loud reading/reciting.     Person(s) Educated  Patient    Methods  Explanation;Verbal cues    Comprehension  Verbalized understanding        SPEECH THERAPY DISCHARGE SUMMARY  Visits from Start of Care: 11  Current functional level related to goals / functional outcomes: See goals below   Remaining deficits: Hypokinetic dysarthria; cognitive communication with slow processing   Education / Equipment: Compensations for dysarthria and cognition; environmental compensations for dysarthria and cognition Plan: Patient agrees to discharge.  Patient goals were met. Patient is being discharged due to meeting the stated rehab goals.  ?????  SLP Short Term Goals - 09/06/18 1501      SLP SHORT TERM GOAL #1   Title  Pt will carryover 2 compensations for attention/memory at home to complete daily chores/tasks with occasional min A over 2 sessions    Baseline  08/14/18; 08/16/18    Time  1    Period  Weeks    Status  Achieved      SLP SHORT TERM GOAL #2   Title  Pt will report carryover of compensations for slow processing in coversations (comprehension and expression) with occasional min A over 2 sessions    Baseline  08/21/18;     Time  1    Period  Weeks    Status  Partially Met      SLP SHORT TERM GOAL #3   Title  Pt will utilize compensations for dysarthria in structured tasks with occasional min A in three sessions    Baseline  08/14/18; 08/16/18; 08/21/18    Time  21    Period  Weeks    Status   Achieved      SLP SHORT TERM GOAL #4   Title  Formal cognitive linguistic assessment as indicated    Time  2    Period  Weeks    Status  Deferred       SLP Long Term Goals - 09/06/18 1501      SLP LONG TERM GOAL #1   Title  pt will demo attention skills adequate for functional verbal expression in 10 minutes simple-mod complex conversation over two sessions    Baseline  08/27/18; 09/04/18    Time  2    Period  Weeks    Status  Achieved      SLP LONG TERM GOAL #2   Title  Pt will be intelligible in 15 minute converation in mildly noisy environment with occasional min A over 2 sessions    Baseline  08/27/18; 09/04/18    Time  2    Period  Weeks    Status  Achieved      SLP LONG TERM GOAL #3   Title  Pt will report 25% reduction (subjectively) in requests for repetition or being told he is mumbling by his spouse over 3 sessions    Time  1      SLP LONG TERM GOAL #4   Title  Pt will reports carryover of environmental compensations for improved intelligibility outside of therapy over 2 sessions    Baseline  09/04/18    Time  1    Period  Weeks    Status  Partially Met       Plan - 09/06/18 1458    Clinical Impression Statement  Pt states his wife has reduced requests for repeitition by 50%. He is utilzing calendar to aid in recall and mini notebook (inconsistently), Attention during complex conversation intact when givien extra response time. D/c ST at this time. Retrun for re-eval in 6 months due to progression of PD.    Speech Therapy Frequency  2x / week    Duration  --   6 weeks or 13 visits   Treatment/Interventions  Language facilitation;Internal/external aids;Compensatory techniques;SLP instruction and feedback;Multimodal communcation approach;Cognitive reorganization;Functional tasks;Cueing hierarchy;Patient/family education    Potential to Achieve Goals  Good    Potential Considerations  Severity of impairments;Previous level of function;Cooperation/participation level        Patient will benefit from skilled therapeutic intervention in order to improve the following deficits and impairments:  Dysarthria  Cognitive communication deficit    Problem List Patient Active Problem List   Diagnosis Date Noted  . Left foot pain 07/26/2018  . TMJ (temporomandibular joint syndrome) 10/10/2017  . Parkinson's disease ([place]) 03/20/2017  . Bilateral lower extremity edema 03/20/2017  . Chronic venous insufficiency 09/13/2016  . Fatigue 03/15/2016  . Parkinsonian features 09/15/2015  . ED (erectile dysfunction) 09/15/2015  . MDD (major depressive disorder), recurrent episode, severe ([place]) 02/10/2014  . Nonspecific abnormal electrocardiogram (ECG) (EKG) 02/07/2014  . Non-compliant behavior 02/03/2014  . Morton's neuroma of left foot 08/07/2013  . Attention deficit disorder without mention of hyperactivity 03/13/2012  . Hyperglycemia 01/26/2012  . Hypogonadism male 01/24/2012  . Syncope and collapse 01/20/2012  .  OSA (obstructive sleep apnea) 01/17/2011  . CHEST TIGHTNESS 02/18/2008    Zaley Talley, Annye Rusk MS, CCC-SLP 09/06/2018, 3:03 PM  [facility] [address] [place], [place], [address] Phone: [phone]   Fax:  [phone]   Name: [name] [name] MRN: [id number] Date of Birth: [birth year]

## 2018-09-06 NOTE — Therapy (Signed)
[facility] [address] [place], [place], [address] Phone: [phone]   Fax:  [phone]  Physical Therapy Treatment and Discharge Summary  Patient Details  Name: [name] [name] MRN: [id number] Date of Birth: [birth year] Referring Provider (PT): Dr. Carles Collet   Encounter Date: 09/06/2018  PT End of Session - 09/06/18 1700    Visit Number  12    Number of Visits  13   corrected based on available visit coverage   Date for PT Re-Evaluation  10/08/18    Authorization Type  Medicare and [place] visits (each discipline counts as 1 visit); 08/09/18:  per count-36 visits used, 39 remain (PT, OT, speech to each use 12)    Authorization - Visit Number  12    Authorization - Number of Visits  12    PT Start Time  7510    PT Stop Time  1602    PT Time Calculation (min)  31 min    Activity Tolerance  Patient limited by fatigue   went to boxing class earlier today   Behavior During Therapy  Flat affect       Past Medical History:  Diagnosis Date  .  OSA (obstructive sleep apnea)    npsg 2012:  AHI 67/hr. Auto titration 2012:  Optimal pressure 12cm.   . APPENDECTOMY, HX OF 02/18/2008   Qualifier: Diagnosis of  By: [place], [place]    . Cardiac murmur    as a child  . Chronic idiopathic constipation   . Depression   . Headache(784.0)   . HEADACHES, HX OF 02/18/2008   Qualifier: Diagnosis of  By: Danny Lawless CMA, [place]    . Parkinson disease ([place] [place]) 11/2014  . Sinus mucosal thickening    pt unable to lay flat  . Streptococcal meningitis    as an infant    Past Surgical History:  Procedure Laterality Date  . APPENDECTOMY  1967  . COLONOSCOPY    . NASAL SINUS SURGERY     x 4 as a child  . VASECTOMY      There were no vitals filed for this visit.  Subjective Assessment - 09/06/18 1534    Subjective  Back pain started yesterday and not sure what caused it. Asking why he needs to come back in 6 months    Pertinent History  depression, Parkinson's disease    Patient Stated Goals  Pt's goals are to help with getting my foot better (explained this is a new injury and he would need to see MD specifically about that).  Also says to help with stiffness and shuffling.    Currently in Pain?  Yes    Pain Score  2     Pain Location  Back    Pain Orientation  Lower    Pain Descriptors / Indicators  Grimacing;Jabbing;Nagging    Pain Type  Acute pain    Pain Onset  More than a month ago    Pain Frequency  Intermittent    Aggravating Factors   unknown    Pain Relieving Factors  unknown         [facility] PT Assessment - 09/06/18 1551      Ambulation/Gait   Ambulation/Gait Assistance  6: Modified independent (Device/Increase time)    Gait velocity  32.8/18.35=1.79 ft/sec      Dynamic Gait Index   Level Surface  Mild Impairment    Change in Gait Speed  Mild Impairment    Gait with Horizontal  Head Turns  Normal    Gait with Vertical Head Turns  Normal    Gait and Pivot Turn  Normal    Step Over Obstacle  Normal    Step Around Obstacles  Normal    Steps  Mild Impairment    Total Score  21      Timed Up and Go Test   Normal TUG (seconds)  19.57                   [facility] Adult PT Treatment/Exercise - 09/06/18 1551      Transfers   Five time sit to stand comments   19.72    Comments  pt asking about car transfer into their low [employer]; educated in sit first, slide and lean seat back to allow incr room to lift feet in (pt reported this was new information, however this has been reviewed previously during this episode of care)      Ambulation/Gait   Ambulation Distance (Feet)  80 Feet   33, 50   Assistive device  Straight cane;None    Gait Pattern  Step-through pattern;Decreased arm swing - left;Decreased step length - right;Decreased step length - left;Trunk flexed;Poor foot clearance - left;Poor foot clearance - right    Gait Comments  pt able to verbalize techniques to assist when  gait freezes (wt-shifting, counting, talking himself through it--relax)             PT Education - 09/06/18 1622    Education Details  results of assessment; rationale for return in 6 months and importance of continued exercise    Person(s) Educated  Patient    Methods  Explanation    Comprehension  Verbalized understanding       PT Short Term Goals - 08/21/18 0920      PT SHORT TERM GOAL #1   Title  Pt will be independent with HEP to target Parkinson's specific deficits.  TARGET for all STGs 08/31/18 (due to delayed start for visits after eval)    Time  4    Period  Weeks    Status  Partially Met      PT SHORT TERM GOAL #2   Title  Pt will improve TUG score to less than or equal to 15 seconds for decreased fall risk.    Baseline  08/21/18 19.78 sec    Time  4    Period  Weeks    Status  Partially Met      PT SHORT TERM GOAL #3   Title  Pt will improve DGI score to at least 15/24 for decreased fall risk.    Baseline  08/21/18  18/24    Time  4    Period  Weeks    Status  Achieved      PT SHORT TERM GOAL #4   Title  Pt will report at least 50% improvement in car transfers.    Baseline  08/21/18 20% improvement    Time  4    Period  Weeks    Status  Partially Met      PT SHORT TERM GOAL #5   Title  Pt will verbalize understanding of fall prevention in home environment.    Baseline  08/21/18 education provided    Time  4    Period  Weeks    Status  On-going        PT Long Term Goals - 09/06/18 1701      PT LONG TERM GOAL #  1   Title  Pt/wife will verbalize understanding of tips to reduce freezing with gait.  UPDATED TARGET for all LTGs 09/28/18--changed to 09/07/18 due to insurance limitations    Baseline  09/06/18 pt able to state several (see PN)    Time  8    Period  Weeks    Status  Achieved      PT LONG TERM GOAL #2   Title  Pt will improve gait velocity score to at least 2 ft/sec for improved gait efficiency and safety.    Baseline  09/06/18 18.35  sec= 1.79 ft/sec    Time  8    Period  Weeks    Status  Partially Met      PT LONG TERM GOAL #3   Title  Pt will improve DGI score to at least 19/24 for decreased fall risk.    Baseline  08/28/18  19/24;  09/06/18 21/24    Time  8    Period  Weeks    Status  Achieved      PT LONG TERM GOAL #4   Title  Pt will improve TUG cognitive score to less than or equal to 20 seconds for decreased fall risk.    Baseline  09/06/18 error-only normal TUG assessed (19.57 sec)    Time  8    Period  Weeks    Status  Not Met            Plan - 09/06/18 1705    Clinical Impression Statement  LTGs assessed with pt meeting 2 of 4 goals, 1 goal partially met (progress made but not to goal level) and one goal not met. Overall pt feels he is moving better after his participation in PT and acknowledges when he last "graduated" from PT he only did his PWR! exercises for ~1 month. He plans to continue to do them daily and return for an evaluation/assessment in 6 months to determine if pt is maintaining gains made. Patient is discharged from PT.     Rehab Potential  Good    Clinical Impairments Affecting Rehab Potential  Memory deficits per speech eval notes    PT Duration  --   plus eval   PT Treatment/Interventions  ADLs/Self Care Home Management;DME Instruction;Balance training;Therapeutic exercise;Therapeutic activities;Functional mobility training;Gait training;Neuromuscular re-education;Patient/family education    Consulted and Agree with Plan of Care  Patient       Patient will benefit from skilled therapeutic intervention in order to improve the following deficits and impairments:  Abnormal gait, Decreased balance, Decreased mobility, Decreased coordination, Decreased strength, Difficulty walking, Impaired flexibility, Postural dysfunction  Visit Diagnosis: Other symptoms and signs involving the nervous system  Other abnormalities of gait and mobility     Problem List Patient Active Problem  List   Diagnosis Date Noted  . Left foot pain 07/26/2018  . TMJ (temporomandibular joint syndrome) 10/10/2017  . Parkinson's disease ([facility]) 03/20/2017  . Bilateral lower extremity edema 03/20/2017  . Chronic venous insufficiency 09/13/2016  . Fatigue 03/15/2016  . Parkinsonian features 09/15/2015  . ED (erectile dysfunction) 09/15/2015  . MDD (major depressive disorder), recurrent episode, severe ([facility]) 02/10/2014  . Nonspecific abnormal electrocardiogram (ECG) (EKG) 02/07/2014  . Non-compliant behavior 02/03/2014  . Morton's neuroma of left foot 08/07/2013  . Attention deficit disorder without mention of hyperactivity 03/13/2012  . Hyperglycemia 01/26/2012  . Hypogonadism male 01/24/2012  . Syncope and collapse 01/20/2012  .  OSA (obstructive sleep apnea) 01/17/2011  . CHEST TIGHTNESS 02/18/2008  PHYSICAL THERAPY DISCHARGE SUMMARY  Visits from Start of Care: 12  Current functional level related to goals / functional outcomes: See LTGs above   Remaining deficits: Festinating gait; decr AROM due to muscle stiffness   Education / Equipment: HEP  Plan: Patient agrees to discharge.  Patient goals were partially met. Patient is being discharged due to the physician's request.  ?????       Rexanne Mano, PT 09/06/2018, 5:20 PM  [facility] [address] [place], [place], [address] Phone: [phone]   Fax:  [phone]  Name: [name] [name] MRN: [id number] Date of Birth: 1951-05-06

## 2018-09-12 ENCOUNTER — Ambulatory Visit (INDEPENDENT_AMBULATORY_CARE_PROVIDER_SITE_OTHER): Payer: Medicare Other | Admitting: Psychology

## 2018-09-12 DIAGNOSIS — F4323 Adjustment disorder with mixed anxiety and depressed mood: Secondary | ICD-10-CM | POA: Diagnosis not present

## 2018-09-14 ENCOUNTER — Other Ambulatory Visit (HOSPITAL_COMMUNITY): Payer: Self-pay | Admitting: Psychiatry

## 2018-09-14 DIAGNOSIS — F411 Generalized anxiety disorder: Secondary | ICD-10-CM

## 2018-09-14 DIAGNOSIS — F332 Major depressive disorder, recurrent severe without psychotic features: Secondary | ICD-10-CM

## 2018-09-18 ENCOUNTER — Other Ambulatory Visit (HOSPITAL_COMMUNITY): Payer: Self-pay

## 2018-09-18 DIAGNOSIS — F332 Major depressive disorder, recurrent severe without psychotic features: Secondary | ICD-10-CM

## 2018-09-18 DIAGNOSIS — F411 Generalized anxiety disorder: Secondary | ICD-10-CM

## 2018-09-18 MED ORDER — BUPROPION HCL ER (XL) 150 MG PO TB24: 150.0000 mg | ORAL_TABLET | ORAL | 1 refills | Status: DC

## 2018-09-24 ENCOUNTER — Ambulatory Visit: Payer: Medicare Other | Admitting: Psychology

## 2018-09-28 DIAGNOSIS — R350 Frequency of micturition: Secondary | ICD-10-CM | POA: Diagnosis not present

## 2018-09-28 DIAGNOSIS — N3941 Urge incontinence: Secondary | ICD-10-CM | POA: Diagnosis not present

## 2018-10-10 ENCOUNTER — Ambulatory Visit (INDEPENDENT_AMBULATORY_CARE_PROVIDER_SITE_OTHER): Payer: Medicare Other | Admitting: Psychology

## 2018-10-10 DIAGNOSIS — F4323 Adjustment disorder with mixed anxiety and depressed mood: Secondary | ICD-10-CM

## 2018-10-12 ENCOUNTER — Other Ambulatory Visit (HOSPITAL_COMMUNITY): Payer: Self-pay | Admitting: Psychiatry

## 2018-10-12 DIAGNOSIS — F411 Generalized anxiety disorder: Secondary | ICD-10-CM

## 2018-10-12 DIAGNOSIS — F332 Major depressive disorder, recurrent severe without psychotic features: Secondary | ICD-10-CM

## 2018-10-16 DIAGNOSIS — N3941 Urge incontinence: Secondary | ICD-10-CM | POA: Diagnosis not present

## 2018-10-16 DIAGNOSIS — R350 Frequency of micturition: Secondary | ICD-10-CM | POA: Diagnosis not present

## 2018-10-31 ENCOUNTER — Ambulatory Visit (INDEPENDENT_AMBULATORY_CARE_PROVIDER_SITE_OTHER): Payer: Medicare Other | Admitting: Psychology

## 2018-10-31 DIAGNOSIS — F4323 Adjustment disorder with mixed anxiety and depressed mood: Secondary | ICD-10-CM | POA: Diagnosis not present

## 2018-11-06 DIAGNOSIS — R3915 Urgency of urination: Secondary | ICD-10-CM | POA: Diagnosis not present

## 2018-11-06 DIAGNOSIS — R350 Frequency of micturition: Secondary | ICD-10-CM | POA: Diagnosis not present

## 2018-11-06 DIAGNOSIS — N3941 Urge incontinence: Secondary | ICD-10-CM | POA: Diagnosis not present

## 2018-11-13 ENCOUNTER — Telehealth (HOSPITAL_COMMUNITY): Payer: Self-pay

## 2018-11-13 ENCOUNTER — Other Ambulatory Visit: Payer: Self-pay | Admitting: Neurology

## 2018-11-13 DIAGNOSIS — F411 Generalized anxiety disorder: Secondary | ICD-10-CM

## 2018-11-13 DIAGNOSIS — F332 Major depressive disorder, recurrent severe without psychotic features: Secondary | ICD-10-CM

## 2018-11-13 MED ORDER — BUPROPION HCL ER (XL) 150 MG PO TB24: 150.0000 mg | ORAL_TABLET | ORAL | 0 refills | Status: DC

## 2018-11-13 NOTE — Telephone Encounter (Signed)
Pharmacy called sent a refill of Bupropion 150mg , 30 tabs with no refill to [facility]

## 2018-11-14 ENCOUNTER — Ambulatory Visit (INDEPENDENT_AMBULATORY_CARE_PROVIDER_SITE_OTHER): Payer: Medicare Other | Admitting: Psychology

## 2018-11-14 DIAGNOSIS — F4323 Adjustment disorder with mixed anxiety and depressed mood: Secondary | ICD-10-CM

## 2018-11-19 ENCOUNTER — Ambulatory Visit (HOSPITAL_COMMUNITY): Payer: Medicare Other | Admitting: Psychiatry

## 2018-11-27 DIAGNOSIS — N3941 Urge incontinence: Secondary | ICD-10-CM | POA: Diagnosis not present

## 2018-11-27 DIAGNOSIS — R350 Frequency of micturition: Secondary | ICD-10-CM | POA: Diagnosis not present

## 2018-11-28 ENCOUNTER — Ambulatory Visit (INDEPENDENT_AMBULATORY_CARE_PROVIDER_SITE_OTHER): Payer: Medicare Other | Admitting: Psychology

## 2018-11-28 DIAGNOSIS — F4323 Adjustment disorder with mixed anxiety and depressed mood: Secondary | ICD-10-CM

## 2018-12-03 ENCOUNTER — Encounter (HOSPITAL_COMMUNITY): Payer: Self-pay | Admitting: Psychiatry

## 2018-12-03 ENCOUNTER — Ambulatory Visit (INDEPENDENT_AMBULATORY_CARE_PROVIDER_SITE_OTHER): Payer: Medicare Other | Admitting: Psychiatry

## 2018-12-03 DIAGNOSIS — F411 Generalized anxiety disorder: Secondary | ICD-10-CM

## 2018-12-03 DIAGNOSIS — F332 Major depressive disorder, recurrent severe without psychotic features: Secondary | ICD-10-CM | POA: Diagnosis not present

## 2018-12-03 MED ORDER — BUPROPION HCL ER (XL) 150 MG PO TB24: 150.0000 mg | ORAL_TABLET | ORAL | 0 refills | Status: DC

## 2018-12-03 MED ORDER — BUPROPION HCL ER (XL) 300 MG PO TB24: ORAL_TABLET | ORAL | 0 refills | Status: DC

## 2018-12-03 MED ORDER — CITALOPRAM HYDROBROMIDE 40 MG PO TABS: 40.0000 mg | ORAL_TABLET | Freq: Every day | ORAL | 0 refills | Status: DC

## 2018-12-03 NOTE — Progress Notes (Signed)
[facility] MD/PA/NP OP Progress Note  12/03/2018 1:58 PM [name] [name]  MRN:  [id number]  Chief Complaint: I am okay but sometimes I feel sundowning.  HPI: [name] [name] came for his appointment with his wife.  On his last visit we increased Wellbutrin to 450 mg.  His wife endorse improvement in his mood and he is not as irritable, frustrated and depressed.  However patient is concerned about his memory issues specially at night.  He feel he has sundowning.  His wife believe he has hearing problem because he listen TV with higher volume and some time she has to repeat her question.  Patient admitted that he watch TV with louder volume.  He feels that his medicine is helping him.  Recently there 84 year old daughter moved from [place] after break-up and her marriage.  Patient's wife told it was a sad but better for them because now they have a company.  Her daughter also had 2 dogs.  Patient likes being around the pets.  He denies any paranoia, hallucination, crying spells or any feeling of hopelessness.  He goes to Dr. parent for CBT and also attends boxing group.  Patient understand the core morbidity of Parkinson with memory impairment and depression.  He is tolerating his medication without any side effects.  His wife is very supportive.  He tried to do crossword puzzles and watch jeopardy to keep his memory intact.  Visit Diagnosis:    ICD-10-CM   1. 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 GAD (generalized anxiety disorder) F41.1 buPROPion (WELLBUTRIN XL) 150 MG 24 hr tablet    Past Psychiatric History: Reviewed. H/O depression and anxiety.  No h/o suicidal attempt, inpatient psychiatric treatment, mania, psychosis, paranoia or any hallucination. Tried Effexor, Abilify, Paxil, Cymbalta, Zoloft, amitriptyline, Pristiq,  Britnellex, Celexa, Deplin,Vyvanseand Latuda. He is seeing in this office since September 2009. He has seen multiple psychiatrists in the past.   Past Medical History:  Past Medical History:  Diagnosis Date  .  OSA (obstructive sleep apnea)    npsg 2012:  AHI 67/hr. Auto titration 2012:  Optimal pressure 12cm.   . APPENDECTOMY, HX OF 02/18/2008   Qualifier: Diagnosis of  By: [place], [place]    . Cardiac murmur    as a child  . Chronic idiopathic constipation   . Depression   . Headache(784.0)   . HEADACHES, HX OF 02/18/2008   Qualifier: Diagnosis of  By: Danny Lawless CMA, [place]    . Parkinson disease ([facility]) 11/2014  . Sinus mucosal thickening    pt unable to lay flat  . Streptococcal meningitis    as an infant    Past Surgical History:  Procedure Laterality Date  . APPENDECTOMY  1967  . COLONOSCOPY    . NASAL SINUS SURGERY     x 4 as a child  . VASECTOMY      Family Psychiatric History: Viewed.  Family History:  Family History  Problem Relation Age of Onset  . Lung cancer Father   . Alcohol abuse Brother   . Drug abuse Brother   . Seizures Daughter   . Colon cancer Neg Hx   . Stomach cancer Neg Hx   . Rectal cancer Neg Hx   . Esophageal cancer Neg Hx     Social History:  Social History  Socioeconomic History  . Marital status: Married    Spouse name: Not on file  . Number of children: Y  . Years of education: Not on file  . Highest education level: Not on file  Occupational History  . 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 Financial resource strain: Not on file  . Food insecurity:    Worry: Not on file    Inability: Not on file  . Transportation needs:    Medical: Not on file    Non-medical: Not on file  Tobacco Use  . Smoking status: Never Smoker  . Smokeless tobacco: Never Used  Substance and Sexual Activity  . Alcohol use: No    Alcohol/week: 0.0 standard drinks  . Drug use: No  . Sexual  activity: Yes    Birth control/protection: None  Lifestyle  . Physical activity:    Days per week: Not on file    Minutes per session: Not on file  . Stress: Not on file  Relationships  . Social connections:    Talks on phone: Not on file    Gets together: Not on file    Attends religious service: Not on file    Active member of club or organization: Not on file    Attends meetings of clubs or organizations: Not on file    Relationship status: Not on file  Other Topics Concern  . Not on file  Social History Narrative   Wife-care partner     Allergies: No Known Allergies  Metabolic Disorder Labs: Lab Results  Component Value Date   HGBA1C 6.1 10/09/2017   No results found for: PROLACTIN Lab Results  Component Value Date   CHOL 172 10/09/2017   TRIG 120.0 10/09/2017   HDL 52.30 10/09/2017   CHOLHDL 3 10/09/2017   VLDL 24.0 10/09/2017   LDLCALC 96 10/09/2017   LDLCALC 127 (H) 03/14/2016   Lab Results  Component Value Date   TSH 1.16 03/14/2016   TSH 0.80 02/06/2014    Therapeutic Level Labs: No results found for: LITHIUM No results found for: VALPROATE No components found for:  CBMZ  Current Medications: Current Outpatient Medications  Medication Sig Dispense Refill  . B Complex-C (B-COMPLEX WITH VITAMIN C) tablet Take 1 tablet by mouth daily.    Marland Kitchen buPROPion (WELLBUTRIN XL) 150 MG 24 hr tablet Take 1 tablet (150 mg total) by mouth every morning. 30 tablet 0  . buPROPion (WELLBUTRIN XL) 300 MG 24 hr tablet TAKE 1 TABLET BY MOUTH EVERY DAY IN THE MORNING 90 tablet 0  . carbidopa-levodopa (SINEMET IR) 25-100 MG tablet 2 IN THE MORNING, 2 IN THE AFTERNOON, AND 1 IN THE EVENING 450 tablet 1  . citalopram (CELEXA) 40 MG tablet Take 1 tablet (40 mg total) by mouth daily. 90 tablet 0  . entacapone (COMTAN) 200 MG tablet Take 1 tablet (200 mg total) by mouth 3 (three) times daily. 90 tablet 5  . fesoterodine (TOVIAZ) 4 MG TB24 tablet Take 1 tablet (4 mg total) by mouth  daily. 30 tablet 0  . LINZESS 290 MCG CAPS capsule TAKE 1 CAPSULE BY MOUTH DAILY BEFORE BREAKFAST. 30 capsule 5  . Omega 3-6-9 Fatty Acids (OMEGA 3-6-9 PO) Take by mouth.    Marland Kitchen rOPINIRole (REQUIP) 1 MG tablet Take 1 tablet (1 mg total) by mouth 3 (three) times daily. 270 tablet 1  . sildenafil (VIAGRA) 100 MG tablet Take 0.5-1 tablets (  50-100 mg total) by mouth daily as needed for erectile dysfunction. (Patient not taking: Reported on 08/16/2018) 10 tablet 2  . TURMERIC CURCUMIN PO Take by mouth.     No current facility-administered medications for this visit.      Musculoskeletal: Strength & Muscle Tone: decreased Gait & Station: shuffle Patient leans: Front and Backward  Psychiatric Specialty Exam: ROS  Blood pressure 134/80, pulse 94, height 6\' 2"  (1.88 m), weight 297 lb (134.7 kg), SpO2 97 %.There is no height or weight on file to calculate BMI.  General Appearance: Fairly Groomed  Eye Contact:  Fair  Speech:  Slow  Volume:  Decreased  Mood:  Dysphoric  Affect:  Constricted and Flat  Thought Process:  Descriptions of Associations: Intact  Orientation:  Full (Time, Place, and Person)  Thought Content: poverty of thought content   Suicidal Thoughts:  No  Homicidal Thoughts:  No  Memory:  Immediate;   Fair Recent;   Fair Remote;   Fair  Judgement:  Fair  Insight:  Present  Psychomotor Activity:  Decreased, Shuffling Gait and Tremor  Concentration:  Concentration: Fair and Attention Span: Fair  Recall:  [employer] of Knowledge: Good  Language: Fair  Akathisia:  No  Handed:  Right  AIMS (if indicated): not done  Assets:  Desire for Improvement Housing Resilience Social Support  ADL's:  Impaired  Cognition: WNL  Sleep:  Good   Screenings:   Assessment and Plan: Patient is doing better since Wellbutrin dose increase.  He is tolerating well.  Recommend to continue Wellbutrin 300 mg in the morning and 150 mg in the afternoon.  Continue Celexa 40 mg daily.  I recommend  to discuss with Dr. Carles Collet about his sundowning and may consider getting low-dose Aricept or Namenda.  Encouraged to continue to see Dr. parent for counseling and boxing group.  Recommended to call us back if is any question or any concern.  I will see him again in 3 months.   Kathlee Nations, MD 12/03/2018, 1:58 PM

## 2018-12-04 ENCOUNTER — Telehealth: Payer: Self-pay

## 2018-12-04 DIAGNOSIS — H9193 Unspecified hearing loss, bilateral: Secondary | ICD-10-CM

## 2018-12-04 NOTE — Telephone Encounter (Signed)
Copied from [place] [id number]. Topic: Referral - Request for Referral >> Dec 04, 2018 10:07 AM Leward Quan A wrote: Has patient seen PCP for this complaint? Yes.   *If NO, is insurance requiring patient see PCP for this issue before PCP can refer them? Referral for which specialty: Audiology Preferred provider/office: [facility] Fax# [phone]  Reason for referral: Hearing checked

## 2018-12-05 NOTE — Telephone Encounter (Signed)
Okay to place? 

## 2018-12-05 NOTE — Addendum Note (Signed)
Addended by: Raford Pitcher R on: 12/05/2018 11:53 AM   Modules accepted: Orders

## 2018-12-08 ENCOUNTER — Encounter (HOSPITAL_COMMUNITY): Payer: Self-pay

## 2018-12-08 ENCOUNTER — Other Ambulatory Visit: Payer: Self-pay

## 2018-12-08 ENCOUNTER — Inpatient Hospital Stay (HOSPITAL_COMMUNITY): Payer: Medicare Other

## 2018-12-08 ENCOUNTER — Inpatient Hospital Stay (HOSPITAL_COMMUNITY)
Admission: EM | Admit: 2018-12-08 | Discharge: 2018-12-11 | DRG: 470 | Disposition: A | Discharge status: Rehabilitation Facility | Payer: Medicare Other | Attending: Internal Medicine | Admitting: Internal Medicine

## 2018-12-08 ENCOUNTER — Emergency Department (HOSPITAL_COMMUNITY): Payer: Medicare Other

## 2018-12-08 DIAGNOSIS — Z813 Family history of other psychoactive substance abuse and dependence: Secondary | ICD-10-CM | POA: Diagnosis not present

## 2018-12-08 DIAGNOSIS — T887XXA Unspecified adverse effect of drug or medicament, initial encounter: Secondary | ICD-10-CM | POA: Diagnosis not present

## 2018-12-08 DIAGNOSIS — K5901 Slow transit constipation: Secondary | ICD-10-CM | POA: Diagnosis not present

## 2018-12-08 DIAGNOSIS — S80811A Abrasion, right lower leg, initial encounter: Secondary | ICD-10-CM | POA: Diagnosis present

## 2018-12-08 DIAGNOSIS — T148XXA Other injury of unspecified body region, initial encounter: Secondary | ICD-10-CM

## 2018-12-08 DIAGNOSIS — N3281 Overactive bladder: Secondary | ICD-10-CM | POA: Diagnosis present

## 2018-12-08 DIAGNOSIS — Z801 Family history of malignant neoplasm of trachea, bronchus and lung: Secondary | ICD-10-CM

## 2018-12-08 DIAGNOSIS — R471 Dysarthria and anarthria: Secondary | ICD-10-CM | POA: Diagnosis present

## 2018-12-08 DIAGNOSIS — N529 Male erectile dysfunction, unspecified: Secondary | ICD-10-CM | POA: Diagnosis present

## 2018-12-08 DIAGNOSIS — R410 Disorientation, unspecified: Secondary | ICD-10-CM | POA: Diagnosis not present

## 2018-12-08 DIAGNOSIS — S40812A Abrasion of left upper arm, initial encounter: Secondary | ICD-10-CM | POA: Diagnosis present

## 2018-12-08 DIAGNOSIS — D62 Acute posthemorrhagic anemia: Secondary | ICD-10-CM | POA: Diagnosis not present

## 2018-12-08 DIAGNOSIS — R400 Somnolence: Secondary | ICD-10-CM | POA: Diagnosis not present

## 2018-12-08 DIAGNOSIS — G20A1 Parkinson's disease without dyskinesia, without mention of fluctuations: Secondary | ICD-10-CM | POA: Diagnosis present

## 2018-12-08 DIAGNOSIS — G20 Parkinson's disease: Secondary | ICD-10-CM | POA: Diagnosis present

## 2018-12-08 DIAGNOSIS — E441 Mild protein-calorie malnutrition: Secondary | ICD-10-CM | POA: Diagnosis present

## 2018-12-08 DIAGNOSIS — S72002D Fracture of unspecified part of neck of left femur, subsequent encounter for closed fracture with routine healing: Secondary | ICD-10-CM | POA: Diagnosis not present

## 2018-12-08 DIAGNOSIS — D72829 Elevated white blood cell count, unspecified: Secondary | ICD-10-CM | POA: Diagnosis present

## 2018-12-08 DIAGNOSIS — R609 Edema, unspecified: Secondary | ICD-10-CM | POA: Diagnosis not present

## 2018-12-08 DIAGNOSIS — F333 Major depressive disorder, recurrent, severe with psychotic symptoms: Secondary | ICD-10-CM | POA: Diagnosis not present

## 2018-12-08 DIAGNOSIS — W108XXA Fall (on) (from) other stairs and steps, initial encounter: Secondary | ICD-10-CM | POA: Diagnosis not present

## 2018-12-08 DIAGNOSIS — Y92015 Private garage of single-family (private) house as the place of occurrence of the external cause: Secondary | ICD-10-CM | POA: Diagnosis not present

## 2018-12-08 DIAGNOSIS — Z01818 Encounter for other preprocedural examination: Secondary | ICD-10-CM | POA: Diagnosis not present

## 2018-12-08 DIAGNOSIS — K5904 Chronic idiopathic constipation: Secondary | ICD-10-CM | POA: Diagnosis present

## 2018-12-08 DIAGNOSIS — E8809 Other disorders of plasma-protein metabolism, not elsewhere classified: Secondary | ICD-10-CM | POA: Diagnosis present

## 2018-12-08 DIAGNOSIS — E46 Unspecified protein-calorie malnutrition: Secondary | ICD-10-CM | POA: Diagnosis present

## 2018-12-08 DIAGNOSIS — Z01811 Encounter for preprocedural respiratory examination: Secondary | ICD-10-CM

## 2018-12-08 DIAGNOSIS — S72002A Fracture of unspecified part of neck of left femur, initial encounter for closed fracture: Secondary | ICD-10-CM | POA: Diagnosis not present

## 2018-12-08 DIAGNOSIS — S72041A Displaced fracture of base of neck of right femur, initial encounter for closed fracture: Secondary | ICD-10-CM | POA: Diagnosis not present

## 2018-12-08 DIAGNOSIS — G4733 Obstructive sleep apnea (adult) (pediatric): Secondary | ICD-10-CM | POA: Diagnosis present

## 2018-12-08 DIAGNOSIS — F332 Major depressive disorder, recurrent severe without psychotic features: Secondary | ICD-10-CM | POA: Diagnosis present

## 2018-12-08 DIAGNOSIS — G8918 Other acute postprocedural pain: Secondary | ICD-10-CM | POA: Diagnosis not present

## 2018-12-08 DIAGNOSIS — Z96649 Presence of unspecified artificial hip joint: Secondary | ICD-10-CM

## 2018-12-08 DIAGNOSIS — R52 Pain, unspecified: Secondary | ICD-10-CM | POA: Diagnosis not present

## 2018-12-08 DIAGNOSIS — F331 Major depressive disorder, recurrent, moderate: Secondary | ICD-10-CM | POA: Diagnosis not present

## 2018-12-08 DIAGNOSIS — Z79899 Other long term (current) drug therapy: Secondary | ICD-10-CM | POA: Diagnosis not present

## 2018-12-08 DIAGNOSIS — L218 Other seborrheic dermatitis: Secondary | ICD-10-CM | POA: Diagnosis present

## 2018-12-08 DIAGNOSIS — F339 Major depressive disorder, recurrent, unspecified: Secondary | ICD-10-CM | POA: Diagnosis present

## 2018-12-08 DIAGNOSIS — Z96642 Presence of left artificial hip joint: Secondary | ICD-10-CM | POA: Diagnosis not present

## 2018-12-08 DIAGNOSIS — R278 Other lack of coordination: Secondary | ICD-10-CM | POA: Diagnosis present

## 2018-12-08 DIAGNOSIS — R4182 Altered mental status, unspecified: Secondary | ICD-10-CM | POA: Diagnosis not present

## 2018-12-08 DIAGNOSIS — Y9389 Activity, other specified: Secondary | ICD-10-CM

## 2018-12-08 DIAGNOSIS — I872 Venous insufficiency (chronic) (peripheral): Secondary | ICD-10-CM | POA: Diagnosis present

## 2018-12-08 DIAGNOSIS — R0989 Other specified symptoms and signs involving the circulatory and respiratory systems: Secondary | ICD-10-CM | POA: Diagnosis present

## 2018-12-08 DIAGNOSIS — M25552 Pain in left hip: Secondary | ICD-10-CM | POA: Diagnosis not present

## 2018-12-08 DIAGNOSIS — F419 Anxiety disorder, unspecified: Secondary | ICD-10-CM | POA: Diagnosis present

## 2018-12-08 DIAGNOSIS — M7989 Other specified soft tissue disorders: Secondary | ICD-10-CM | POA: Diagnosis not present

## 2018-12-08 DIAGNOSIS — R9431 Abnormal electrocardiogram [ECG] [EKG]: Secondary | ICD-10-CM | POA: Diagnosis present

## 2018-12-08 DIAGNOSIS — W108XXD Fall (on) (from) other stairs and steps, subsequent encounter: Secondary | ICD-10-CM | POA: Diagnosis present

## 2018-12-08 DIAGNOSIS — R1310 Dysphagia, unspecified: Secondary | ICD-10-CM | POA: Diagnosis present

## 2018-12-08 DIAGNOSIS — Z471 Aftercare following joint replacement surgery: Secondary | ICD-10-CM | POA: Diagnosis not present

## 2018-12-08 DIAGNOSIS — Z811 Family history of alcohol abuse and dependence: Secondary | ICD-10-CM

## 2018-12-08 DIAGNOSIS — S72091A Other fracture of head and neck of right femur, initial encounter for closed fracture: Secondary | ICD-10-CM | POA: Diagnosis not present

## 2018-12-08 DIAGNOSIS — R1312 Dysphagia, oropharyngeal phase: Secondary | ICD-10-CM | POA: Diagnosis not present

## 2018-12-08 DIAGNOSIS — S72042A Displaced fracture of base of neck of left femur, initial encounter for closed fracture: Secondary | ICD-10-CM | POA: Diagnosis not present

## 2018-12-08 DIAGNOSIS — R739 Hyperglycemia, unspecified: Secondary | ICD-10-CM | POA: Diagnosis present

## 2018-12-08 DIAGNOSIS — G218 Other secondary parkinsonism: Secondary | ICD-10-CM | POA: Diagnosis not present

## 2018-12-08 LAB — CBC WITH DIFFERENTIAL/PLATELET
Abs Immature Granulocytes: 0.05 10*3/uL (ref 0.00–0.07)
Basophils Absolute: 0 10*3/uL (ref 0.0–0.1)
Basophils Relative: 0 %
Eosinophils Absolute: 0.1 10*3/uL (ref 0.0–0.5)
Eosinophils Relative: 1 %
HCT: 40 % (ref 39.0–52.0)
HEMOGLOBIN: 12.9 g/dL — AB (ref 13.0–17.0)
Immature Granulocytes: 1 %
LYMPHS PCT: 10 %
Lymphs Abs: 1.1 10*3/uL (ref 0.7–4.0)
MCH: 31.1 pg (ref 26.0–34.0)
MCHC: 32.3 g/dL (ref 30.0–36.0)
MCV: 96.4 fL (ref 80.0–100.0)
Monocytes Absolute: 0.6 10*3/uL (ref 0.1–1.0)
Monocytes Relative: 5 %
Neutro Abs: 8.7 10*3/uL — ABNORMAL HIGH (ref 1.7–7.7)
Neutrophils Relative %: 83 %
Platelets: 262 10*3/uL (ref 150–400)
RBC: 4.15 MIL/uL — ABNORMAL LOW (ref 4.22–5.81)
RDW: 12.9 % (ref 11.5–15.5)
WBC: 10.5 10*3/uL (ref 4.0–10.5)
nRBC: 0 % (ref 0.0–0.2)

## 2018-12-08 LAB — BASIC METABOLIC PANEL
Anion gap: 7 (ref 5–15)
BUN: 17 mg/dL (ref 8–23)
CO2: 24 mmol/L (ref 22–32)
Calcium: 8.6 mg/dL — ABNORMAL LOW (ref 8.9–10.3)
Chloride: 108 mmol/L (ref 98–111)
Creatinine, Ser: 1.13 mg/dL (ref 0.61–1.24)
GFR calc Af Amer: >60 mL/min (ref >60)
GFR calc non Af Amer: >60 mL/min (ref >60)
Glucose, Bld: 93 mg/dL (ref 70–99)
Potassium: 3.7 mmol/L (ref 3.5–5.1)
Sodium: 139 mmol/L (ref 135–145)

## 2018-12-08 LAB — PROTIME-INR
INR: 1.1 (ref 0.8–1.2)
Prothrombin Time: 14.2 seconds (ref 11.4–15.2)

## 2018-12-08 LAB — ABO/RH: ABO/RH(D): O POS

## 2018-12-08 MED ORDER — SODIUM CHLORIDE 0.9 % IV SOLN: INTRAVENOUS | Status: DC

## 2018-12-08 MED ORDER — ROPINIROLE HCL 1 MG PO TABS
1.0000 mg | ORAL_TABLET | Freq: Three times a day (TID) | ORAL | Status: DC
  Administered 2018-12-09 – 2018-12-11 (×7): 1 mg via ORAL
  Filled 2018-12-08 (×7): qty 1

## 2018-12-08 MED ORDER — POLYETHYLENE GLYCOL 3350 17 G PO PACK: 17.0000 g | PACK | Freq: Every day | ORAL | Status: DC | PRN

## 2018-12-08 MED ORDER — DEXTROSE 5 % IV SOLN
3.0000 g | Freq: Once | INTRAVENOUS | Status: AC
  Administered 2018-12-09: 3 g via INTRAVENOUS
  Filled 2018-12-08: qty 3

## 2018-12-08 MED ORDER — ONDANSETRON HCL 4 MG PO TABS: 4.0000 mg | ORAL_TABLET | Freq: Four times a day (QID) | ORAL | Status: DC | PRN

## 2018-12-08 MED ORDER — MORPHINE SULFATE (PF) 2 MG/ML IV SOLN
0.5000 mg | INTRAVENOUS | Status: DC | PRN
  Administered 2018-12-08 – 2018-12-09 (×3): 0.5 mg via INTRAVENOUS
  Filled 2018-12-08 (×3): qty 1

## 2018-12-08 MED ORDER — MORPHINE SULFATE (PF) 4 MG/ML IV SOLN
4.0000 mg | Freq: Once | INTRAVENOUS | Status: AC
  Administered 2018-12-08: 4 mg via INTRAVENOUS
  Filled 2018-12-08: qty 1

## 2018-12-08 MED ORDER — MORPHINE SULFATE (PF) 4 MG/ML IV SOLN: 4.0000 mg | INTRAVENOUS | Status: DC | PRN

## 2018-12-08 MED ORDER — FESOTERODINE FUMARATE ER 4 MG PO TB24
4.0000 mg | ORAL_TABLET | Freq: Every day | ORAL | Status: DC
  Administered 2018-12-09 – 2018-12-10 (×2): 4 mg via ORAL
  Filled 2018-12-08 (×4): qty 1

## 2018-12-08 MED ORDER — ENTACAPONE 200 MG PO TABS
200.0000 mg | ORAL_TABLET | Freq: Three times a day (TID) | ORAL | Status: DC
  Administered 2018-12-09 – 2018-12-11 (×7): 200 mg via ORAL
  Filled 2018-12-08 (×11): qty 1

## 2018-12-08 MED ORDER — LINACLOTIDE 145 MCG PO CAPS
290.0000 ug | ORAL_CAPSULE | Freq: Every day | ORAL | Status: DC
  Administered 2018-12-10 – 2018-12-11 (×2): 290 ug via ORAL
  Filled 2018-12-08 (×3): qty 2

## 2018-12-08 MED ORDER — BUPROPION HCL ER (XL) 150 MG PO TB24: 150.0000 mg | ORAL_TABLET | Freq: Every day | ORAL | Status: DC

## 2018-12-08 MED ORDER — ACETAMINOPHEN 325 MG PO TABS: 650.0000 mg | ORAL_TABLET | Freq: Four times a day (QID) | ORAL | Status: DC | PRN

## 2018-12-08 MED ORDER — ONDANSETRON HCL 4 MG/2ML IJ SOLN: 4.0000 mg | Freq: Four times a day (QID) | INTRAMUSCULAR | Status: DC | PRN

## 2018-12-08 MED ORDER — HYDROCODONE-ACETAMINOPHEN 5-325 MG PO TABS
1.0000 | ORAL_TABLET | Freq: Four times a day (QID) | ORAL | Status: DC | PRN
  Administered 2018-12-08: 2 via ORAL
  Filled 2018-12-08: qty 2

## 2018-12-08 MED ORDER — METHOCARBAMOL 750 MG PO TABS
750.0000 mg | ORAL_TABLET | Freq: Three times a day (TID) | ORAL | Status: DC | PRN
  Administered 2018-12-11: 750 mg via ORAL
  Filled 2018-12-08: qty 1

## 2018-12-08 MED ORDER — CARBIDOPA-LEVODOPA 25-100 MG PO TABS
1.0000 | ORAL_TABLET | Freq: Three times a day (TID) | ORAL | Status: DC
  Administered 2018-12-09 – 2018-12-10 (×3): 1 via ORAL
  Administered 2018-12-10: 2 via ORAL
  Administered 2018-12-10 – 2018-12-11 (×3): 1 via ORAL
  Filled 2018-12-08 (×3): qty 1
  Filled 2018-12-08: qty 2
  Filled 2018-12-08 (×3): qty 1

## 2018-12-08 MED ORDER — ENOXAPARIN SODIUM 40 MG/0.4ML ~~LOC~~ SOLN: 40.0000 mg | SUBCUTANEOUS | Status: DC

## 2018-12-08 MED ORDER — ACETAMINOPHEN 650 MG RE SUPP: 650.0000 mg | Freq: Four times a day (QID) | RECTAL | Status: DC | PRN

## 2018-12-08 MED ORDER — BUPROPION HCL ER (XL) 150 MG PO TB24
300.0000 mg | ORAL_TABLET | Freq: Every day | ORAL | Status: DC
  Administered 2018-12-08: 300 mg via ORAL
  Filled 2018-12-08 (×2): qty 2

## 2018-12-08 MED ORDER — CITALOPRAM HYDROBROMIDE 40 MG PO TABS
40.0000 mg | ORAL_TABLET | Freq: Every day | ORAL | Status: DC
  Administered 2018-12-08 – 2018-12-10 (×3): 40 mg via ORAL
  Filled 2018-12-08: qty 1
  Filled 2018-12-08: qty 4
  Filled 2018-12-08: qty 1

## 2018-12-08 NOTE — ED Notes (Signed)
Patient transported to X-ray 

## 2018-12-08 NOTE — ED Notes (Signed)
[facility] notified of need for transport. °

## 2018-12-08 NOTE — ED Notes (Signed)
Bed: WA03 Expected date:  Expected time:  Means of arrival:  Comments: 68 yo fall; hip pain

## 2018-12-08 NOTE — ED Notes (Signed)
ED TO INPATIENT HANDOFF REPORT  ED Nurse Name and Phone #: [phone]  S Name/Age/Gender [name] [name] 68 y.o. male Room/Bed: WA03/WA03  Code Status   Code Status: Full Code  Home/SNF/Other Home Patient oriented to: situation Is this baseline? Yes   Triage Complete: Triage complete  Chief Complaint fall  Triage Note Patient is from home. Patient arrived via [facility]. Patient is AOx4 and ambulatory at baseline however not currently due to fall. Patient fell injuring left hip and left leg. Patient has small abrasion on right lower leg. Patient does have parkinson's. Pain is 4/10 unless moved then pain shoots up to 10/10. Just laying pain is manageable.    Allergies No Known Allergies  Level of Care/Admitting Diagnosis ED Disposition    ED Disposition Condition Comment   Admit  Hospital Area: [facility] [[id number]]  Level of Care: Med-Surg [16]  Diagnosis: Closed left hip fracture [facility]) [[id number]]  Admitting Physician: Mariel Aloe [phone]  Attending Physician: Mariel Aloe [phone]  Estimated length of stay: past midnight tomorrow  Certification:: I certify this patient will need inpatient services for at least 2 midnights  PT Class (Do Not Modify): Inpatient [101]  PT Acc Code (Do Not Modify): Private [1]       B Medical/Surgery History Past Medical History:  Diagnosis Date  .  OSA (obstructive sleep apnea)    npsg 2012:  AHI 67/hr. Auto titration 2012:  Optimal pressure 12cm.   . APPENDECTOMY, HX OF 02/18/2008   Qualifier: Diagnosis of  By: [place], [place]    . Cardiac murmur    as a child  . Chronic idiopathic constipation   . Depression   . Headache(784.0)   . HEADACHES, HX OF 02/18/2008   Qualifier: Diagnosis of  By: Danny Lawless CMA, [place]    . Parkinson disease ([place]) 11/2014  . Sinus mucosal thickening    pt unable to lay flat  . Streptococcal meningitis    as an infant   Past Surgical History:  Procedure Laterality Date  .  APPENDECTOMY  1967  . COLONOSCOPY    . NASAL SINUS SURGERY     x 4 as a child  . VASECTOMY       A IV Location/Drains/Wounds Patient Lines/Drains/Airways Status   Active Line/Drains/Airways    Name:   Placement date:   Placement time:   Site:   Days:   Peripheral IV 12/08/18 Right Antecubital   12/08/18    1650    Antecubital   less than 1          Intake/Output Last 24 hours No intake or output data in the 24 hours ending 12/08/18 1814  Labs/Imaging Results for orders placed or performed during the hospital encounter of 12/08/18 (from the past 48 hour(s))  Basic metabolic panel     Status: Abnormal   Collection Time: 12/08/18  5:07 PM  Result Value Ref Range   Sodium 139 135 - 145 mmol/L   Potassium 3.7 3.5 - 5.1 mmol/L   Chloride 108 98 - 111 mmol/L   CO2 24 22 - 32 mmol/L   Glucose, Bld 93 70 - 99 mg/dL   BUN 17 8 - 23 mg/dL   Creatinine, Ser 1.13 0.61 - 1.24 mg/dL   Calcium 8.6 (L) 8.9 - 10.3 mg/dL   GFR calc non Af Amer >60 >60 mL/min   GFR calc Af Amer >60 >60 mL/min   Anion gap 7 5 - 15  Comment: Performed at [facility], [place] [address] [place], [place] [address]  CBC WITH DIFFERENTIAL     Status: Abnormal   Collection Time: 12/08/18  5:07 PM  Result Value Ref Range   WBC 10.5 4.0 - 10.5 K/uL   RBC 4.15 (L) 4.22 - 5.81 MIL/uL   Hemoglobin 12.9 (L) 13.0 - 17.0 g/dL   HCT 40.0 39.0 - 52.0 %   MCV 96.4 80.0 - 100.0 fL   MCH 31.1 26.0 - 34.0 pg   MCHC 32.3 30.0 - 36.0 g/dL   RDW 12.9 11.5 - 15.5 %   Platelets 262 150 - 400 K/uL   nRBC 0.0 0.0 - 0.2 %   Neutrophils Relative % 83 %   Neutro Abs 8.7 (H) 1.7 - 7.7 K/uL   Lymphocytes Relative 10 %   Lymphs Abs 1.1 0.7 - 4.0 K/uL   Monocytes Relative 5 %   Monocytes Absolute 0.6 0.1 - 1.0 K/uL   Eosinophils Relative 1 %   Eosinophils Absolute 0.1 0.0 - 0.5 K/uL   Basophils Relative 0 %   Basophils Absolute 0.0 0.0 - 0.1 K/uL   Immature Granulocytes 1 %   Abs Immature Granulocytes 0.05  0.00 - 0.07 K/uL    Comment: Performed at [facility], [place] [address], [place], [place] [address]  Protime-INR     Status: None   Collection Time: 12/08/18  5:07 PM  Result Value Ref Range   Prothrombin Time 14.2 11.4 - 15.2 seconds   INR 1.1 0.8 - 1.2    Comment: (NOTE) INR goal varies based on device and disease states. Performed at [facility], [place] [address] [place], [place] [address]   Type and screen [place]     Status: None   Collection Time: 12/08/18  5:09 PM  Result Value Ref Range   ABO/RH(D) O POS    Antibody Screen NEG    Sample Expiration      12/11/2018 Performed at [facility], [place] [address], [place], [place] [address]    Dg Hip Unilat W Or Wo Pelvis 2-3 Views Left  Result Date: 12/08/2018 CLINICAL DATA:  Left leg shortening. EXAM: DG HIP (WITH OR WITHOUT PELVIS) 2-3V LEFT COMPARISON:  None. FINDINGS: Fracture of the left femoral neck. Pelvic bony ring is intact. Right hip is intact. Superior displacement of the left femoral trochanters secondary to the fracture. Left femoral head appears to be located on the cross-table lateral view. Degenerative changes in the lower lumbar spine. IMPRESSION: Fracture of the left femoral neck. Electronically Signed   By: Markus Daft M.D.   On: 12/08/2018 16:30   Dg Femur Min 2 Views Left  Result Date: 12/08/2018 CLINICAL DATA:  Fall with left hip injury EXAM: LEFT FEMUR 2 VIEWS COMPARISON:  None. FINDINGS: Impacted left femoral neck fracture. No additional fracture in left femoral shaft. No evidence of dislocation at the left hip or left knee on these views. No focal osseous lesions. No radiopaque foreign body. IMPRESSION: Impacted left femoral neck fracture, please see the separate concurrent dedicated left hip radiograph report for further details. Electronically Signed   By: Ilona Sorrel M.D.   On: 12/08/2018 16:29    Pending Labs Unresulted Labs (From  admission, onward)    Start     Ordered   12/15/18 0500  Creatinine, serum  (enoxaparin (LOVENOX)    CrCl >/= 30 ml/min)  Weekly,   R  Comments:  while on enoxaparin therapy    12/08/18 1737   12/09/18 0500  HIV antibody (Routine Testing)  Tomorrow morning,   R     12/08/18 1737   12/09/18 9528  Basic metabolic panel  Tomorrow morning,   R     12/08/18 1737   12/09/18 0500  CBC  Tomorrow morning,   R     12/08/18 1737   12/08/18 1756  ABO/Rh  Once,   R     12/08/18 1756          Vitals/Pain Today's Vitals   12/08/18 1630 12/08/18 1700 12/08/18 1730 12/08/18 1800  BP: 122/84 124/83 129/80 129/81  Pulse: 85 89 84 84  Resp:  (!) 22  18  Temp:      TempSrc:      SpO2: 94% 94% 90% 90%  Weight:      Height:      PainSc:        Isolation Precautions No active isolations  Medications Medications  buPROPion (WELLBUTRIN XL) 24 hr tablet 300 mg (has no administration in time range)    And  buPROPion (WELLBUTRIN XL) 24 hr tablet 150 mg (has no administration in time range)  citalopram (CELEXA) tablet 40 mg (has no administration in time range)  rOPINIRole (REQUIP) tablet 1 mg (has no administration in time range)  entacapone (COMTAN) tablet 200 mg (has no administration in time range)  carbidopa-levodopa (SINEMET IR) 25-100 MG per tablet immediate release 1-2 tablet (has no administration in time range)  fesoterodine (TOVIAZ) tablet 4 mg (has no administration in time range)  linaclotide (LINZESS) capsule 290 mcg (has no administration in time range)  enoxaparin (LOVENOX) injection 40 mg (has no administration in time range)  acetaminophen (TYLENOL) tablet 650 mg (has no administration in time range)    Or  acetaminophen (TYLENOL) suppository 650 mg (has no administration in time range)  ondansetron (ZOFRAN) tablet 4 mg (has no administration in time range)    Or  ondansetron (ZOFRAN) injection 4 mg (has no administration in time range)  polyethylene glycol (MIRALAX /  GLYCOLAX) packet 17 g (has no administration in time range)  HYDROcodone-acetaminophen (NORCO/VICODIN) 5-325 MG per tablet 1-2 tablet (has no administration in time range)  morphine 2 MG/ML injection 0.5 mg (has no administration in time range)  0.9 %  sodium chloride infusion (has no administration in time range)  morphine 4 MG/ML injection 4 mg (4 mg Intravenous Given 12/08/18 1614)    Mobility walks Moderate fall risk   Focused Assessments musculoskeletal   R Recommendations: See Admitting Provider Note  Report given to:   Additional Notes:

## 2018-12-08 NOTE — ED Provider Notes (Signed)
[facility] DEPT Provider Note   CSN: [id number] Arrival date & time: 12/08/18  1401    History   Chief Complaint Chief Complaint  Patient presents with  . Fall    HPI [name] DOHSE is a 68 y.o. male with history of Parkinson's presenting today after fall.  Patient states that he was walking into his garage when he missed the final step, he states that he fell towards his left side catching himself on his vehicle.  Patient states that he continued to fall after trying to catch himself on the vehicle and slid downward and landed on his left side.  Patient denies head injury or loss of consciousness.  Patient denies headache, vision changes, neck pain, back pain, chest pain or abdominal pain.  Patient is reporting pain to the left hip.  Patient was unable to remove himself from the ground secondary to left hip pain today.  He describes his left hip pain as a constant moderate intensity aching throbbing worsened with movement of the leg and improved with lying still.  Patient denies numbness/weakness or tingling of the extremity.  Patient denies any other concerns today.     HPI  Past Medical History:  Diagnosis Date  .  OSA (obstructive sleep apnea)    npsg 2012:  AHI 67/hr. Auto titration 2012:  Optimal pressure 12cm.   . APPENDECTOMY, HX OF 02/18/2008   Qualifier: Diagnosis of  By: [place], [place]    . Cardiac murmur    as a child  . Chronic idiopathic constipation   . Depression   . Headache(784.0)   . HEADACHES, HX OF 02/18/2008   Qualifier: Diagnosis of  By: Danny Lawless CMA, [place]    . Parkinson disease ([place]) 11/2014  . Sinus mucosal thickening    pt unable to lay flat  . Streptococcal meningitis    as an infant    Patient Active Problem List   Diagnosis Date Noted  . Left foot pain 07/26/2018  . TMJ (temporomandibular joint syndrome) 10/10/2017  . Parkinson's disease ([place]) 03/20/2017  . Bilateral lower extremity edema 03/20/2017   . Chronic venous insufficiency 09/13/2016  . Fatigue 03/15/2016  . Parkinsonian features 09/15/2015  . ED (erectile dysfunction) 09/15/2015  . MDD (major depressive disorder), recurrent episode, severe ([place]) 02/10/2014  . Nonspecific abnormal electrocardiogram (ECG) (EKG) 02/07/2014  . Non-compliant behavior 02/03/2014  . Morton's neuroma of left foot 08/07/2013  . 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 Hyperglycemia 01/26/2012  . Hypogonadism male 01/24/2012  . Syncope and collapse 01/20/2012  .  OSA (obstructive sleep apnea) 01/17/2011  . CHEST TIGHTNESS 02/18/2008    Past Surgical History:  Procedure Laterality Date  . APPENDECTOMY  1967  . COLONOSCOPY    . NASAL SINUS SURGERY     x 4 as a child  . VASECTOMY        Home Medications    Prior to Admission medications   Medication Sig Start Date End Date Taking? Authorizing Provider  B Complex-C (B-COMPLEX WITH VITAMIN C) tablet Take 1 tablet by mouth daily.   Yes [provider]  buPROPion (WELLBUTRIN XL) 150 MG 24 hr tablet Take 1 tablet (150 mg total) by mouth every morning. 12/03/18 12/03/19 Yes Arfeen, Arlyce Harman, MD  buPROPion (WELLBUTRIN XL) 300 MG 24 hr tablet TAKE 1 TABLET BY MOUTH EVERY DAY IN THE MORNING 12/03/18  Yes Arfeen, Arlyce Harman, MD  carbidopa-levodopa (SINEMET IR) 25-100 MG tablet 2 IN THE  MORNING, 2 IN THE AFTERNOON, AND 1 IN THE EVENING Patient taking differently: Take 1-2 tablets by mouth See admin instructions. Take 2 tabs at 7am, Take 2 tabs at 11 am, and Take 2 tabs at 4 pm. 11/13/18  Yes Tat, Eustace Quail, DO  citalopram (CELEXA) 40 MG tablet Take 1 tablet (40 mg total) by mouth daily. 12/03/18  Yes Arfeen, Arlyce Harman, MD  entacapone (COMTAN) 200 MG tablet Take 1 tablet (200 mg total) by mouth 3 (three) times daily. 08/16/18  Yes Tat, Eustace Quail, DO  fesoterodine (TOVIAZ) 4 MG TB24 tablet Take 1 tablet (4 mg total) by mouth daily. 03/20/17  Yes Golden Circle, FNP  LINZESS 290  MCG CAPS capsule TAKE 1 CAPSULE BY MOUTH DAILY BEFORE BREAKFAST. Patient taking differently: Take 290 mcg by mouth daily before breakfast.  08/06/18  Yes Pyrtle, Lajuan Lines, MD  Omega 3-6-9 Fatty Acids (OMEGA 3-6-9 PO) Take 1 capsule by mouth daily.    Yes [provider]  rOPINIRole (REQUIP) 1 MG tablet Take 1 tablet (1 mg total) by mouth 3 (three) times daily. 06/05/18  Yes Tat, Eustace Quail, DO  TURMERIC CURCUMIN PO Take 1 capsule by mouth daily.    Yes [provider]  sildenafil (VIAGRA) 100 MG tablet Take 0.5-1 tablets (50-100 mg total) by mouth daily as needed for erectile dysfunction. Patient not taking: Reported on 08/16/2018 07/26/18   [name], MD  Trospium Chloride 60 MG CP24 Take 60 mg by mouth daily. 08/29/18   [provider]    Family History Family History  Problem Relation Age of Onset  . Lung cancer Father   . Alcohol abuse Brother   . Drug abuse Brother   . Seizures Daughter   . Colon cancer Neg Hx   . Stomach cancer Neg Hx   . Rectal cancer Neg Hx   . Esophageal cancer Neg Hx     Social History Social History   Tobacco Use  . Smoking status: Never Smoker  . Smokeless tobacco: Never Used  Substance Use Topics  . Alcohol use: No    Alcohol/week: 0.0 standard drinks  . Drug use: No     Allergies   Patient has no known allergies.   Review of Systems Review of Systems  Constitutional: Negative.  Negative for chills and fever.  Eyes: Negative.  Negative for visual disturbance.  Respiratory: Negative.  Negative for shortness of breath.   Cardiovascular: Negative.  Negative for chest pain.  Gastrointestinal: Negative.  Negative for abdominal pain, nausea and vomiting.  Musculoskeletal: Positive for arthralgias (Left hip). Negative for back pain and neck pain.  Skin: Positive for wound (Small abrasion to left upper arm, right lower leg). Negative for color change.  Neurological: Negative.  Negative for dizziness, syncope,  weakness, light-headedness, numbness and headaches.       Denies saddle area paresthesias Denies bowel/bladder incontinence Denies urinary retention  All other systems reviewed and are negative.   Physical Exam Updated Vital Signs BP 124/83   Pulse 89   Temp 98.9 F (37.2 C) (Oral)   Resp (!) 22   Ht 6\' 2"  (1.88 m)   Wt 133.8 kg   SpO2 94%   BMI 37.88 kg/m   Physical Exam Constitutional:      General: He is not in acute distress.    Appearance: Normal appearance. He is well-developed. He is obese. He is not ill-appearing or diaphoretic.  HENT:     Head: Normocephalic and atraumatic.  No raccoon eyes, Battle's sign, abrasion or contusion.     Jaw: There is normal jaw occlusion. No trismus.     Right Ear: Tympanic membrane, ear canal and external ear normal. No hemotympanum.     Left Ear: Tympanic membrane, ear canal and external ear normal. No hemotympanum.     Nose: Nose normal.     Mouth/Throat:     Lips: Pink.     Mouth: Mucous membranes are moist.     Pharynx: Oropharynx is clear. Uvula midline.  Eyes:     General: Vision grossly intact. Gaze aligned appropriately.     Extraocular Movements: Extraocular movements intact.     Conjunctiva/sclera: Conjunctivae normal.     Pupils: Pupils are equal, round, and reactive to light.     Comments: Visual fields grossly intact bilaterally  Neck:     Musculoskeletal: Full passive range of motion without pain, normal range of motion and neck supple. No spinous process tenderness or muscular tenderness.     Trachea: Trachea and phonation normal. No tracheal tenderness or tracheal deviation.  Cardiovascular:     Rate and Rhythm: Normal rate and regular rhythm.     Pulses:          Dorsalis pedis pulses are 2+ on the right side and 2+ on the left side.       Posterior tibial pulses are 2+ on the right side and 2+ on the left side.     Heart sounds: Normal heart sounds.  Pulmonary:     Effort: Pulmonary effort is normal. No  respiratory distress.     Breath sounds: Normal breath sounds and air entry. No wheezing or rhonchi.  Chest:     Chest wall: No deformity, tenderness or crepitus.  Abdominal:     General: Bowel sounds are normal. There is no distension.     Palpations: Abdomen is soft.     Tenderness: There is no abdominal tenderness. There is no guarding or rebound.     Comments: Obese abdomen  Genitourinary:    Comments: Deferred by patient Musculoskeletal: Normal range of motion.     Comments: Left leg appears somewhat shortened, it is externally rotated as well.  Pain with movement of the left leg at the hip.  Tenderness over the left lateral gluteal muscles.  Pelvis stable to compression bilaterally without pain.  No midline C/T/L spinal tenderness to palpation, no paraspinal muscle tenderness, no deformity, crepitus, or step-off noted. No sign of injury to the neck or back.   Feet:     Right foot:     Protective Sensation: 5 sites tested. 5 sites sensed.     Left foot:     Protective Sensation: 5 sites tested. 5 sites sensed.  Skin:    General: Skin is warm and dry.     Capillary Refill: Capillary refill takes less than 2 seconds.          Comments: Small superficial abrasion of the right lower leg and left upper arm.  No active bleeding.  Neurological:     Mental Status: He is alert.     GCS: GCS eye subscore is 4. GCS verbal subscore is 5. GCS motor subscore is 6.     Comments: Mental Status:Patient w/ Parkinson's, at baseline per family. Alert, oriented, thought content appropriate, able to give a coherent history. Speech fluent without evidence of aphasia. Able to follow 2 step commands without difficulty. Cranial Nerves: II: Peripheral visual fields grossly normal, pupils equal, round,  reactive to light III,IV, VI: ptosis not present, extra-ocular motions intact bilaterally V,VII: smile symmetric, eyebrows raise symmetric, facial light touch sensation equal VIII: hearing grossly  normal to voice X: uvula elevates symmetrically XI: bilateral shoulder shrug symmetric and strong XII: midline tongue extension without fassiculations Motor: Normal tone. 5/5 strength in upper and lower extremities bilaterally including strong and equal grip strength and dorsiflexion/plantar flexion Sensory: Sensation intact to light touch in all extremities. Cerebellar: normal finger-to-nose with bilateral upper extremities. No pronator drift.  CV: distal pulses palpable throughout  Psychiatric:        Behavior: Behavior normal.    ED Treatments / Results  Labs (all labs ordered are listed, but only abnormal results are displayed) Labs Reviewed  BASIC METABOLIC PANEL  CBC WITH DIFFERENTIAL/PLATELET  PROTIME-INR  TYPE AND SCREEN    EKG None  Radiology Dg Hip Unilat W Or Wo Pelvis 2-3 Views Left  Result Date: 12/08/2018 CLINICAL DATA:  Left leg shortening. EXAM: DG HIP (WITH OR WITHOUT PELVIS) 2-3V LEFT COMPARISON:  None. FINDINGS: Fracture of the left femoral neck. Pelvic bony ring is intact. Right hip is intact. Superior displacement of the left femoral trochanters secondary to the fracture. Left femoral head appears to be located on the cross-table lateral view. Degenerative changes in the lower lumbar spine. IMPRESSION: Fracture of the left femoral neck. Electronically Signed   By: Markus Daft M.D.   On: 12/08/2018 16:30   Dg Femur Min 2 Views Left  Result Date: 12/08/2018 CLINICAL DATA:  Fall with left hip injury EXAM: LEFT FEMUR 2 VIEWS COMPARISON:  None. FINDINGS: Impacted left femoral neck fracture. No additional fracture in left femoral shaft. No evidence of dislocation at the left hip or left knee on these views. No focal osseous lesions. No radiopaque foreign body. IMPRESSION: Impacted left femoral neck fracture, please see the separate concurrent dedicated left hip radiograph report for further details. Electronically Signed   By: Ilona Sorrel M.D.   On: 12/08/2018 16:29      Procedures Procedures (including critical care time)  Medications Ordered in ED Medications  morphine 4 MG/ML injection 4 mg (4 mg Intravenous Given 12/08/18 1614)    Initial Impression / Assessment and Plan / ED Course  I have reviewed the triage vital signs and the nursing notes.  Pertinent labs & imaging results that were available during my care of the patient were reviewed by me and considered in my medical decision making (see chart for details).  Clinical Course as of Dec 07 1713  Sat Dec 08, 2018  1643 [facility] hospitalist; NPO after midnight   [BM]    Clinical Course User Index [BM] Deliah Boston, PA-C      Patient with fracture of the left femoral neck.  Bilateral lower extremities neurovascularly intact.  Pain controlled.  Called to Dr. Sherrian Divers who asked that we admit to hospitalist service at [facility] for surgery tomorrow, n.p.o. after midnight.  Preop labs and EKG ordered.  Consult called to hospitalist for admission.  Patient and family updated on care plan and are agreeable for admission at this time.  On reassessment patient states that his pain is well controlled and requires no further medication at this time.  States that he feels well and is without additional complaint aside from left hip tenderness. - Patient been admitted to hospitalist service.  Patient was seen and evaluated by Dr. Sedonia Small during this encounter.  Note: Portions of this report may have been transcribed using voice  recognition software. Every effort was made to ensure accuracy; however, inadvertent computerized transcription errors may still be present. Final Clinical Impressions(s) / ED Diagnoses   Final diagnoses:  Closed fracture of neck of left femur, initial encounter [facility])    ED Discharge Orders    None       Gari Crown 12/08/18 1719    Maudie Flakes, MD 12/09/18 [phone]

## 2018-12-08 NOTE — Consult Note (Signed)
ORTHOPAEDIC CONSULTATION  REQUESTING PHYSICIAN: Mariel Aloe, MD  Chief Complaint: Left femoral neck fracture  HPI: [name] [name] is a 68 y.o. male who presents with left hip fracture s/p mechanical fall PTA at home when he missed a step in his garage.  He has parkinson's disease.  The patient endorses severe pain in the left hip, that does not radiate, grinding in quality, worse with any movement, better with immobilization.  Denies LOC/fever/chills/nausea/vomiting.  Walks without assistive devices (walker, cane, wheelchair).  He is fully independent with ADLs.  Denies LOC, neck pain, abd pain.  Past Medical History:  Diagnosis Date  .  OSA (obstructive sleep apnea)    npsg 2012:  AHI 67/hr. Auto titration 2012:  Optimal pressure 12cm.   . APPENDECTOMY, HX OF 02/18/2008   Qualifier: Diagnosis of  By: [place], [place]    . Cardiac murmur    as a child  . Chronic idiopathic constipation   . Depression   . Headache(784.0)   . HEADACHES, HX OF 02/18/2008   Qualifier: Diagnosis of  By: Danny Lawless CMA, [place]    . Parkinson disease ([place]) 11/2014  . Sinus mucosal thickening    pt unable to lay flat  . Streptococcal meningitis    as an infant   Past Surgical History:  Procedure Laterality Date  . APPENDECTOMY  1967  . COLONOSCOPY    . NASAL SINUS SURGERY     x 4 as a child  . VASECTOMY     Social History   Socioeconomic History  . Marital status: Married    Spouse name: Not on file  . Number of children: Y  . Years of education: Not on file  . Highest education level: Not on file  Occupational History  . 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 Financial resource strain: Not on file  . Food insecurity:    Worry: Not on file    Inability: Not on file  . Transportation needs:    Medical: Not on file    Non-medical: Not on file  Tobacco Use  . Smoking status: Never Smoker  . Smokeless tobacco: Never Used    Substance and Sexual Activity  . Alcohol use: No    Alcohol/week: 0.0 standard drinks  . Drug use: No  . Sexual activity: Yes    Birth control/protection: None  Lifestyle  . Physical activity:    Days per week: Not on file    Minutes per session: Not on file  . Stress: Not on file  Relationships  . Social connections:    Talks on phone: Not on file    Gets together: Not on file    Attends religious service: Not on file    Active member of club or organization: Not on file    Attends meetings of clubs or organizations: Not on file    Relationship status: Not on file  Other Topics Concern  . Not on file  Social History Narrative   Wife-care partner    Family History  Problem Relation Age of Onset  . Lung cancer Father   . Alcohol abuse Brother   . Drug abuse Brother   . Seizures Daughter   . Colon cancer Neg Hx   . Stomach cancer Neg Hx   . Rectal cancer Neg Hx   . Esophageal cancer Neg Hx    No Known Allergies Prior to Admission medications  Medication Sig Start Date End Date Taking? Authorizing Provider  B Complex-C (B-COMPLEX WITH VITAMIN C) tablet Take 1 tablet by mouth daily.   Yes [provider]  buPROPion (WELLBUTRIN XL) 150 MG 24 hr tablet Take 1 tablet (150 mg total) by mouth every morning. 12/03/18 12/03/19 Yes Arfeen, Arlyce Harman, MD  buPROPion (WELLBUTRIN XL) 300 MG 24 hr tablet TAKE 1 TABLET BY MOUTH EVERY DAY IN THE MORNING 12/03/18  Yes Arfeen, Arlyce Harman, MD  carbidopa-levodopa (SINEMET IR) 25-100 MG tablet 2 IN THE MORNING, 2 IN THE AFTERNOON, AND 1 IN THE EVENING Patient taking differently: Take 1-2 tablets by mouth See admin instructions. Take 2 tabs at 7am, Take 2 tabs at 11 am, and Take 2 tabs at 4 pm. 11/13/18  Yes Tat, Eustace Quail, DO  citalopram (CELEXA) 40 MG tablet Take 1 tablet (40 mg total) by mouth daily. 12/03/18  Yes Arfeen, Arlyce Harman, MD  entacapone (COMTAN) 200 MG tablet Take 1 tablet (200 mg total) by mouth 3 (three) times daily. 08/16/18  Yes Tat,  Eustace Quail, DO  fesoterodine (TOVIAZ) 4 MG TB24 tablet Take 1 tablet (4 mg total) by mouth daily. 03/20/17  Yes Golden Circle, FNP  LINZESS 290 MCG CAPS capsule TAKE 1 CAPSULE BY MOUTH DAILY BEFORE BREAKFAST. Patient taking differently: Take 290 mcg by mouth daily before breakfast.  08/06/18  Yes Pyrtle, Lajuan Lines, MD  Omega 3-6-9 Fatty Acids (OMEGA 3-6-9 PO) Take 1 capsule by mouth daily.    Yes [provider]  rOPINIRole (REQUIP) 1 MG tablet Take 1 tablet (1 mg total) by mouth 3 (three) times daily. 06/05/18  Yes Tat, Eustace Quail, DO  TURMERIC CURCUMIN PO Take 1 capsule by mouth daily.    Yes [provider]  sildenafil (VIAGRA) 100 MG tablet Take 0.5-1 tablets (50-100 mg total) by mouth daily as needed for erectile dysfunction. Patient not taking: Reported on 08/16/2018 07/26/18   [name], MD  Trospium Chloride 60 MG CP24 Take 60 mg by mouth daily. 08/29/18   [provider]   Dg Chest Port 1 View  Result Date: 12/08/2018 CLINICAL DATA:  Preop hip fracture. EXAM: PORTABLE CHEST 1 VIEW COMPARISON:  10/02/2013 FINDINGS: Mild cardiomegaly and vascular congestion. Low lung volumes. Left base atelectasis. No confluent opacity on the right. No overt edema, effusions or acute bony abnormality. IMPRESSION: Low lung volumes with mild cardiomegaly, vascular congestion and left base atelectasis. Electronically Signed   By: Rolm Baptise M.D.   On: 12/08/2018 18:50   Dg Hip Unilat W Or Wo Pelvis 2-3 Views Left  Result Date: 12/08/2018 CLINICAL DATA:  Left leg shortening. EXAM: DG HIP (WITH OR WITHOUT PELVIS) 2-3V LEFT COMPARISON:  None. FINDINGS: Fracture of the left femoral neck. Pelvic bony ring is intact. Right hip is intact. Superior displacement of the left femoral trochanters secondary to the fracture. Left femoral head appears to be located on the cross-table lateral view. Degenerative changes in the lower lumbar spine. IMPRESSION: Fracture of the left femoral neck.  Electronically Signed   By: Markus Daft M.D.   On: 12/08/2018 16:30   Dg Femur Min 2 Views Left  Result Date: 12/08/2018 CLINICAL DATA:  Fall with left hip injury EXAM: LEFT FEMUR 2 VIEWS COMPARISON:  None. FINDINGS: Impacted left femoral neck fracture. No additional fracture in left femoral shaft. No evidence of dislocation at the left hip or left knee on these views. No focal osseous lesions. No radiopaque foreign body. IMPRESSION: Impacted  left femoral neck fracture, please see the separate concurrent dedicated left hip radiograph report for further details. Electronically Signed   By: Ilona Sorrel M.D.   On: 12/08/2018 16:29    All pertinent xrays, MRI, CT independently reviewed and interpreted  Positive ROS: All other systems have been reviewed and were otherwise negative with the exception of those mentioned in the HPI and as above.  Physical Exam: General: Alert, no acute distress Cardiovascular: No pedal edema Respiratory: No cyanosis, no use of accessory musculature GI: No organomegaly, abdomen is soft and non-tender Skin: No lesions in the area of chief complaint Neurologic: Sensation intact distally Psychiatric: Patient is competent for consent with normal mood and affect Lymphatic: No axillary or cervical lymphadenopathy  MUSCULOSKELETAL:  - severe pain with movement of the hip and extremity - skin intact - NVI distally - compartments soft  Assessment: Left femoral neck fracture  Plan: - total hip replacement is recommended, patient and family are aware of r/b/a and wish to proceed, informed consent obtained - medical optimization per primary team - surgery is planned for Sunday morning - hold lovenox for surgery  Thank you for the consult and the opportunity to see Mr. Fidencio Duddy. Eduard Roux, MD [place] 8:59 PM

## 2018-12-08 NOTE — ED Triage Notes (Signed)
Patient is from home. Patient arrived via [facility]. Patient is AOx4 and ambulatory at baseline however not currently due to fall. Patient fell injuring left hip and left leg. Patient has small abrasion on right lower leg. Patient does have parkinson's. Pain is 4/10 unless moved then pain shoots up to 10/10. Just laying pain is manageable.

## 2018-12-08 NOTE — H&P (Signed)
History and Physical    [name] [name] SWF:[id number] DOB: 02-22-1951 DOA: 12/08/2018  PCP: [name] Koch, MD Patient coming from: Home  Chief Complaint: Left hip pain  HPI: [name] [name] is a 68 y.o. male with medical history significant of Parkinson disease, depression.  Patient presents secondary to fall earlier this afternoon while walking down into his garage.  He states that he missed the last step leading to his garage, fell towards his car and then fell on his left hip.  He did not hit his head or blackout.  On attempts to ambulate, he had significant pain but was able to ambulate with the help of his family and neighbor to a chair.  Pain significantly worsened and EMS was called for transfer to the emergency department.  Pain is improved with morphine in the ED but worsens to 10 out of 10 with movement.  ED Course: Vitals: Afebrile, normal respirations, normotensive, normal pulse, on room air Labs: Hemoglobin of 12.9 Imaging: Left femoral neck fracture, old fractures of his left foot Medications/Course: Morphine  Review of Systems: Review of Systems  Constitutional: Negative for chills and fever.  Respiratory: Negative for shortness of breath.   Cardiovascular: Negative for chest pain and palpitations.  Neurological: Negative for dizziness and loss of consciousness.  All other systems reviewed and are negative.   Past Medical History:  Diagnosis Date  .  OSA (obstructive sleep apnea)    npsg 2012:  AHI 67/hr. Auto titration 2012:  Optimal pressure 12cm.   . APPENDECTOMY, HX OF 02/18/2008   Qualifier: Diagnosis of  By: [name], [name]    . Cardiac murmur    as a child  . Chronic idiopathic constipation   . Depression   . Headache(784.0)   . HEADACHES, HX OF 02/18/2008   Qualifier: Diagnosis of  By: [name] [name] CMA, [name]    . Parkinson disease ([place]) 11/2014  . Sinus mucosal thickening    pt unable to lay flat  . Streptococcal meningitis    as an infant     Past Surgical History:  Procedure Laterality Date  . APPENDECTOMY  1967  . COLONOSCOPY    . NASAL SINUS SURGERY     x 4 as a child  . VASECTOMY       reports that he has never smoked. He has never used smokeless tobacco. He reports that he does not drink alcohol or use drugs.  No Known Allergies  Family History  Problem Relation Age of Onset  . Lung cancer Father   . Alcohol abuse Brother   . Drug abuse Brother   . Seizures Daughter   . Colon cancer Neg Hx   . Stomach cancer Neg Hx   . Rectal cancer Neg Hx   . Esophageal cancer Neg Hx     Prior to Admission medications   Medication Sig Start Date End Date Taking? Authorizing Provider  B Complex-C (B-COMPLEX WITH VITAMIN C) tablet Take 1 tablet by mouth daily.   Yes [provider]  buPROPion (WELLBUTRIN XL) 150 MG 24 hr tablet Take 1 tablet (150 mg total) by mouth every morning. 12/03/18 12/03/19 Yes Arfeen, Arlyce Harman, MD  buPROPion (WELLBUTRIN XL) 300 MG 24 hr tablet TAKE 1 TABLET BY MOUTH EVERY DAY IN THE MORNING 12/03/18  Yes Arfeen, Arlyce Harman, MD  carbidopa-levodopa (SINEMET IR) 25-100 MG tablet 2 IN THE MORNING, 2 IN THE AFTERNOON, AND 1 IN THE EVENING Patient taking differently: Take 1-2 tablets by mouth See  admin instructions. Take 2 tabs at 7am, Take 2 tabs at 11 am, and Take 2 tabs at 4 pm. 11/13/18  Yes Tat, [name] Quail, DO  citalopram (CELEXA) 40 MG tablet Take 1 tablet (40 mg total) by mouth daily. 12/03/18  Yes Arfeen, Arlyce Harman, MD  entacapone (COMTAN) 200 MG tablet Take 1 tablet (200 mg total) by mouth 3 (three) times daily. 08/16/18  Yes Tat, [name] Quail, DO  fesoterodine (TOVIAZ) 4 MG TB24 tablet Take 1 tablet (4 mg total) by mouth daily. 03/20/17  Yes Golden Circle, FNP  LINZESS 290 MCG CAPS capsule TAKE 1 CAPSULE BY MOUTH DAILY BEFORE BREAKFAST. Patient taking differently: Take 290 mcg by mouth daily before breakfast.  08/06/18  Yes Pyrtle, [name] Lines, MD  Omega 3-6-9 Fatty Acids (OMEGA 3-6-9 PO) Take 1 capsule by mouth  daily.    Yes [provider]  rOPINIRole (REQUIP) 1 MG tablet Take 1 tablet (1 mg total) by mouth 3 (three) times daily. 06/05/18  Yes Tat, [name] Quail, DO  TURMERIC CURCUMIN PO Take 1 capsule by mouth daily.    Yes [provider]  sildenafil (VIAGRA) 100 MG tablet Take 0.5-1 tablets (50-100 mg total) by mouth daily as needed for erectile dysfunction. Patient not taking: Reported on 08/16/2018 07/26/18   [name] Koch, MD  Trospium Chloride 60 MG CP24 Take 60 mg by mouth daily. 08/29/18   [provider]    Physical Exam:  Physical Exam Constitutional:      General: He is not in acute distress.    Appearance: He is well-developed. He is not diaphoretic.  HENT:     Mouth/Throat:     Mouth: Mucous membranes are dry.  Eyes:     Conjunctiva/sclera: Conjunctivae normal.     Pupils: Pupils are equal, round, and reactive to light.  Neck:     Musculoskeletal: Normal range of motion.  Cardiovascular:     Rate and Rhythm: Normal rate and regular rhythm.     Heart sounds: Normal heart sounds. No murmur.  Pulmonary:     Effort: Pulmonary effort is normal. No respiratory distress.     Breath sounds: Normal breath sounds. No wheezing or rales.  Abdominal:     General: Bowel sounds are normal. There is no distension.     Palpations: Abdomen is soft.     Tenderness: There is no abdominal tenderness. There is no guarding or rebound.  Musculoskeletal: Normal range of motion.        General: No tenderness.     Comments: Limb length discrepancy  Lymphadenopathy:     Cervical: No cervical adenopathy.  Skin:    General: Skin is warm and dry.  Neurological:     Mental Status: He is alert and oriented to person, place, and time.     Comments: Mild rigidity of upper arms    Labs on Admission: I have personally reviewed following labs and imaging studies  CBC: Recent Labs  Lab 12/08/18 1707  WBC 10.5  NEUTROABS 8.7*  HGB 12.9*  HCT 40.0  MCV 96.4  PLT 262     Basic Metabolic Panel: Recent Labs  Lab 12/08/18 1707  NA 139  K 3.7  CL 108  CO2 24  GLUCOSE 93  BUN 17  CREATININE 1.13  CALCIUM 8.6*    GFR: Estimated Creatinine Clearance: 94.8 mL/min (by C-G formula based on SCr of 1.13 mg/dL).  Coagulation Profile: Recent Labs  Lab 12/08/18 1707  INR 1.1  Urine analysis:    Component Value Date/Time   COLORURINE YELLOW 01/20/2012 1530   APPEARANCEUR CLEAR 01/20/2012 1530   LABSPEC 1.025 01/20/2012 1530   PHURINE 6.0 01/20/2012 1530   GLUCOSEU NEGATIVE 01/20/2012 1530   HGBUR NEGATIVE 01/20/2012 1530   BILIRUBINUR SMALL 01/20/2012 1530   KETONESUR NEGATIVE 01/20/2012 1530   UROBILINOGEN 1.0 01/20/2012 1530   NITRITE NEGATIVE 01/20/2012 1530   LEUKOCYTESUR NEGATIVE 01/20/2012 1530     Radiological Exams on Admission: Dg Hip Unilat W Or Wo Pelvis 2-3 Views Left  Result Date: 12/08/2018 CLINICAL DATA:  Left leg shortening. EXAM: DG HIP (WITH OR WITHOUT PELVIS) 2-3V LEFT COMPARISON:  None. FINDINGS: Fracture of the left femoral neck. Pelvic bony ring is intact. Right hip is intact. Superior displacement of the left femoral trochanters secondary to the fracture. Left femoral head appears to be located on the cross-table lateral view. Degenerative changes in the lower lumbar spine. IMPRESSION: Fracture of the left femoral neck. Electronically Signed   By: Markus Daft M.D.   On: 12/08/2018 16:30   Dg Femur Min 2 Views Left  Result Date: 12/08/2018 CLINICAL DATA:  Fall with left hip injury EXAM: LEFT FEMUR 2 VIEWS COMPARISON:  None. FINDINGS: Impacted left femoral neck fracture. No additional fracture in left femoral shaft. No evidence of dislocation at the left hip or left knee on these views. No focal osseous lesions. No radiopaque foreign body. IMPRESSION: Impacted left femoral neck fracture, please see the separate concurrent dedicated left hip radiograph report for further details. Electronically Signed   By: Ilona Sorrel M.D.    On: 12/08/2018 16:29    EKG: Independently reviewed. None obtained in ED  Assessment/Plan Principal Problem:   Closed left hip fracture (HCC) Active Problems:   MDD (major depressive disorder), recurrent episode, severe ([place])   Parkinson's disease ([place])   Left hip fracture Secondary to mechanical fall.  In setting of underlying Parkinson disease. -Orthopedic surgery recommendations: Plan for operative management in the morning; recommending transfer to [facility] -Tylenol, Norco, morphine PRN for pain -Social work, case Mudlogger, dietitian consults -EKG, Chest x-ray  Parkinson's disease Patient follows with neurology as an outpatient. -Continue Sinemet IR, entacapone, Requip  Depression -Continue Wellbutrin, Celexa  Erectile dysfunction -Hold sildenafil  Overactive bladder -Continue Toviaz   DVT prophylaxis: Lovenox Code Status: Full code Family Communication: Wife, two daughters at bedside Disposition Plan: Transfer to [facility], [place], for orthopedic surgery management Consults called: Orthopedic surgery by EDP Admission status: Inpatient   Cordelia Poche, MD [facility] 12/08/2018, 5:37 PM  If 7PM-7AM, please contact night-coverage [email or web address] Password [email or web address]

## 2018-12-09 ENCOUNTER — Inpatient Hospital Stay (HOSPITAL_COMMUNITY): Payer: Medicare Other

## 2018-12-09 ENCOUNTER — Encounter (HOSPITAL_COMMUNITY): Payer: Self-pay | Admitting: Anesthesiology

## 2018-12-09 ENCOUNTER — Encounter (HOSPITAL_COMMUNITY)
Admission: EM | Disposition: A | Discharge status: Rehabilitation Facility | Payer: Self-pay | Source: Home / Self Care | Attending: Internal Medicine

## 2018-12-09 ENCOUNTER — Inpatient Hospital Stay (HOSPITAL_COMMUNITY): Payer: Medicare Other | Admitting: Anesthesiology

## 2018-12-09 DIAGNOSIS — S72041A Displaced fracture of base of neck of right femur, initial encounter for closed fracture: Secondary | ICD-10-CM

## 2018-12-09 DIAGNOSIS — S72002D Fracture of unspecified part of neck of left femur, subsequent encounter for closed fracture with routine healing: Secondary | ICD-10-CM

## 2018-12-09 DIAGNOSIS — F333 Major depressive disorder, recurrent, severe with psychotic symptoms: Secondary | ICD-10-CM

## 2018-12-09 DIAGNOSIS — S72042A Displaced fracture of base of neck of left femur, initial encounter for closed fracture: Secondary | ICD-10-CM

## 2018-12-09 DIAGNOSIS — S72002A Fracture of unspecified part of neck of left femur, initial encounter for closed fracture: Secondary | ICD-10-CM

## 2018-12-09 HISTORY — PX: TOTAL HIP ARTHROPLASTY: SHX124

## 2018-12-09 LAB — TYPE AND SCREEN
ABO/RH(D): O POS
ABO/RH(D): O POS
Antibody Screen: NEGATIVE
Antibody Screen: NEGATIVE

## 2018-12-09 LAB — CREATININE, SERUM
Creatinine, Ser: 1.18 mg/dL (ref 0.61–1.24)
GFR calc Af Amer: >60 mL/min (ref >60)
GFR calc non Af Amer: >60 mL/min (ref >60)

## 2018-12-09 LAB — CBC
HEMATOCRIT: 34.8 % — AB (ref 39.0–52.0)
Hemoglobin: 11.5 g/dL — ABNORMAL LOW (ref 13.0–17.0)
MCH: 30.8 pg (ref 26.0–34.0)
MCHC: 33 g/dL (ref 30.0–36.0)
MCV: 93.3 fL (ref 80.0–100.0)
Platelets: 229 10*3/uL (ref 150–400)
RBC: 3.73 MIL/uL — ABNORMAL LOW (ref 4.22–5.81)
RDW: 12.7 % (ref 11.5–15.5)
WBC: 14 10*3/uL — ABNORMAL HIGH (ref 4.0–10.5)
nRBC: 0 % (ref 0.0–0.2)

## 2018-12-09 LAB — ABO/RH: ABO/RH(D): O POS

## 2018-12-09 LAB — SURGICAL PCR SCREEN
MRSA, PCR: NEGATIVE
Staphylococcus aureus: NEGATIVE

## 2018-12-09 SURGERY — ARTHROPLASTY, HIP, TOTAL, ANTERIOR APPROACH
Anesthesia: General | Site: Hip | Laterality: Left

## 2018-12-09 MED ORDER — 0.9 % SODIUM CHLORIDE (POUR BTL) OPTIME
TOPICAL | Status: DC | PRN
  Administered 2018-12-09: 1000 mL

## 2018-12-09 MED ORDER — DEXTROSE 5 % IV SOLN
3.0000 g | Freq: Three times a day (TID) | INTRAVENOUS | Status: AC
  Administered 2018-12-09 – 2018-12-10 (×3): 3 g via INTRAVENOUS
  Filled 2018-12-09 (×3): qty 3000
  Filled 2018-12-09: qty 3

## 2018-12-09 MED ORDER — SODIUM CHLORIDE 0.9 % IV SOLN
INTRAVENOUS | Status: DC | PRN
  Administered 2018-12-09: 25 ug/min via INTRAVENOUS

## 2018-12-09 MED ORDER — TRANEXAMIC ACID-NACL 1000-0.7 MG/100ML-% IV SOLN
INTRAVENOUS | Status: DC | PRN
  Administered 2018-12-09: 1000 mg via INTRAVENOUS

## 2018-12-09 MED ORDER — KETOROLAC TROMETHAMINE 15 MG/ML IJ SOLN: 30.0000 mg | Freq: Four times a day (QID) | INTRAMUSCULAR | Status: DC | PRN

## 2018-12-09 MED ORDER — DEXTROSE 5 % IV SOLN: 3.0000 g | Freq: Three times a day (TID) | INTRAVENOUS | Status: DC

## 2018-12-09 MED ORDER — DEXMEDETOMIDINE HCL IN NACL 200 MCG/50ML IV SOLN
INTRAVENOUS | Status: DC | PRN
  Administered 2018-12-09: 8 ug via INTRAVENOUS

## 2018-12-09 MED ORDER — FENTANYL CITRATE (PF) 100 MCG/2ML IJ SOLN
INTRAMUSCULAR | Status: DC | PRN
  Administered 2018-12-09: 50 ug via INTRAVENOUS

## 2018-12-09 MED ORDER — ACETAMINOPHEN 500 MG PO TABS
1000.0000 mg | ORAL_TABLET | Freq: Four times a day (QID) | ORAL | Status: AC
  Administered 2018-12-09 – 2018-12-10 (×4): 1000 mg via ORAL
  Filled 2018-12-09 (×4): qty 2

## 2018-12-09 MED ORDER — OXYCODONE-ACETAMINOPHEN 5-325 MG PO TABS: 1.0000 | ORAL_TABLET | Freq: Four times a day (QID) | ORAL | 0 refills | Status: DC | PRN

## 2018-12-09 MED ORDER — ENOXAPARIN SODIUM 40 MG/0.4ML ~~LOC~~ SOLN: 40.0000 mg | Freq: Every day | SUBCUTANEOUS | 0 refills | Status: DC

## 2018-12-09 MED ORDER — MIDAZOLAM HCL 2 MG/2ML IJ SOLN
INTRAMUSCULAR | Status: AC
  Filled 2018-12-09: qty 2

## 2018-12-09 MED ORDER — PROPOFOL 10 MG/ML IV BOLUS
INTRAVENOUS | Status: DC | PRN
  Administered 2018-12-09 (×2): 30 mg via INTRAVENOUS
  Administered 2018-12-09 (×2): 20 mg via INTRAVENOUS

## 2018-12-09 MED ORDER — ENOXAPARIN SODIUM 40 MG/0.4ML ~~LOC~~ SOLN
40.0000 mg | SUBCUTANEOUS | Status: DC
  Administered 2018-12-10 – 2018-12-11 (×2): 40 mg via SUBCUTANEOUS
  Filled 2018-12-09 (×2): qty 0.4

## 2018-12-09 MED ORDER — DOCUSATE SODIUM 100 MG PO CAPS
100.0000 mg | ORAL_CAPSULE | Freq: Two times a day (BID) | ORAL | Status: DC
  Administered 2018-12-09 – 2018-12-11 (×5): 100 mg via ORAL
  Filled 2018-12-09 (×5): qty 1

## 2018-12-09 MED ORDER — MENTHOL 3 MG MT LOZG: 1.0000 | LOZENGE | OROMUCOSAL | Status: DC | PRN

## 2018-12-09 MED ORDER — VANCOMYCIN HCL 1000 MG IV SOLR
INTRAVENOUS | Status: AC
  Filled 2018-12-09: qty 1000

## 2018-12-09 MED ORDER — OXYCODONE HCL 5 MG PO TABS
5.0000 mg | ORAL_TABLET | ORAL | Status: DC | PRN
  Administered 2018-12-10: 5 mg via ORAL
  Filled 2018-12-09: qty 1

## 2018-12-09 MED ORDER — ONDANSETRON HCL 4 MG PO TABS: 4.0000 mg | ORAL_TABLET | Freq: Four times a day (QID) | ORAL | Status: DC | PRN

## 2018-12-09 MED ORDER — TRANEXAMIC ACID-NACL 1000-0.7 MG/100ML-% IV SOLN
INTRAVENOUS | Status: AC
  Filled 2018-12-09: qty 100

## 2018-12-09 MED ORDER — SODIUM CHLORIDE 0.9 % IR SOLN
Status: DC | PRN
  Administered 2018-12-09: 3000 mL

## 2018-12-09 MED ORDER — PHENYLEPHRINE 40 MCG/ML (10ML) SYRINGE FOR IV PUSH (FOR BLOOD PRESSURE SUPPORT)
PREFILLED_SYRINGE | INTRAVENOUS | Status: DC | PRN
  Administered 2018-12-09: 120 ug via INTRAVENOUS

## 2018-12-09 MED ORDER — SORBITOL 70 % SOLN: 30.0000 mL | Freq: Every day | Status: DC | PRN

## 2018-12-09 MED ORDER — METHOCARBAMOL 500 MG PO TABS
500.0000 mg | ORAL_TABLET | Freq: Four times a day (QID) | ORAL | Status: DC | PRN
  Administered 2018-12-10 – 2018-12-11 (×3): 500 mg via ORAL
  Filled 2018-12-09 (×3): qty 1

## 2018-12-09 MED ORDER — OXYCODONE HCL 5 MG PO TABS: 10.0000 mg | ORAL_TABLET | ORAL | Status: DC | PRN

## 2018-12-09 MED ORDER — BUPIVACAINE IN DEXTROSE 0.75-8.25 % IT SOLN
INTRATHECAL | Status: DC | PRN
  Administered 2018-12-09: 2 mL via INTRATHECAL

## 2018-12-09 MED ORDER — ACETAMINOPHEN 10 MG/ML IV SOLN
INTRAVENOUS | Status: DC | PRN
  Administered 2018-12-09: 1000 mg via INTRAVENOUS

## 2018-12-09 MED ORDER — PROPOFOL 500 MG/50ML IV EMUL
INTRAVENOUS | Status: DC | PRN
  Administered 2018-12-09: 50 ug/kg/min via INTRAVENOUS

## 2018-12-09 MED ORDER — LACTATED RINGERS IV SOLN
INTRAVENOUS | Status: DC | PRN
  Administered 2018-12-09 (×2): via INTRAVENOUS

## 2018-12-09 MED ORDER — VANCOMYCIN HCL 1 G IV SOLR
INTRAVENOUS | Status: DC | PRN
  Administered 2018-12-09: 1000 mg via TOPICAL

## 2018-12-09 MED ORDER — PHENOL 1.4 % MT LIQD: 1.0000 | OROMUCOSAL | Status: DC | PRN

## 2018-12-09 MED ORDER — BUPROPION HCL ER (XL) 150 MG PO TB24
150.0000 mg | ORAL_TABLET | Freq: Every day | ORAL | Status: DC
  Administered 2018-12-09 – 2018-12-10 (×2): 150 mg via ORAL
  Filled 2018-12-09: qty 1

## 2018-12-09 MED ORDER — BUPROPION HCL ER (XL) 150 MG PO TB24
300.0000 mg | ORAL_TABLET | Freq: Every day | ORAL | Status: DC
  Administered 2018-12-09 – 2018-12-11 (×3): 300 mg via ORAL
  Filled 2018-12-09 (×3): qty 2

## 2018-12-09 MED ORDER — PROPOFOL 10 MG/ML IV BOLUS
INTRAVENOUS | Status: AC
  Filled 2018-12-09: qty 20

## 2018-12-09 MED ORDER — ACETAMINOPHEN 10 MG/ML IV SOLN
INTRAVENOUS | Status: AC
  Filled 2018-12-09: qty 100

## 2018-12-09 MED ORDER — DEXMEDETOMIDINE HCL IN NACL 200 MCG/50ML IV SOLN
INTRAVENOUS | Status: AC
  Filled 2018-12-09: qty 50

## 2018-12-09 MED ORDER — ACETAMINOPHEN 325 MG PO TABS: 325.0000 mg | ORAL_TABLET | Freq: Four times a day (QID) | ORAL | Status: DC | PRN

## 2018-12-09 MED ORDER — TRANEXAMIC ACID 1000 MG/10ML IV SOLN
2000.0000 mg | Freq: Once | INTRAVENOUS | Status: DC
  Filled 2018-12-09: qty 20

## 2018-12-09 MED ORDER — PHENYLEPHRINE 40 MCG/ML (10ML) SYRINGE FOR IV PUSH (FOR BLOOD PRESSURE SUPPORT)
PREFILLED_SYRINGE | INTRAVENOUS | Status: AC
  Filled 2018-12-09: qty 10

## 2018-12-09 MED ORDER — POLYETHYLENE GLYCOL 3350 17 G PO PACK: 17.0000 g | PACK | Freq: Every day | ORAL | Status: DC | PRN

## 2018-12-09 MED ORDER — HYDROMORPHONE HCL 1 MG/ML IJ SOLN: 0.5000 mg | INTRAMUSCULAR | Status: DC | PRN

## 2018-12-09 MED ORDER — SODIUM CHLORIDE 0.9 % IV SOLN
INTRAVENOUS | Status: DC
  Administered 2018-12-09: 13:00:00 via INTRAVENOUS

## 2018-12-09 MED ORDER — MAGNESIUM CITRATE PO SOLN: 1.0000 | Freq: Once | ORAL | Status: DC | PRN

## 2018-12-09 MED ORDER — FENTANYL CITRATE (PF) 250 MCG/5ML IJ SOLN
INTRAMUSCULAR | Status: AC
  Filled 2018-12-09: qty 5

## 2018-12-09 MED ORDER — ONDANSETRON HCL 4 MG/2ML IJ SOLN: 4.0000 mg | Freq: Four times a day (QID) | INTRAMUSCULAR | Status: DC | PRN

## 2018-12-09 MED ORDER — ALUM & MAG HYDROXIDE-SIMETH 200-200-20 MG/5ML PO SUSP
30.0000 mL | ORAL | Status: DC | PRN
  Administered 2018-12-10: 30 mL via ORAL
  Filled 2018-12-09: qty 30

## 2018-12-09 MED ORDER — ALBUMIN HUMAN 5 % IV SOLN
INTRAVENOUS | Status: DC | PRN
  Administered 2018-12-09: 09:00:00 via INTRAVENOUS

## 2018-12-09 MED ORDER — DEXAMETHASONE SODIUM PHOSPHATE 10 MG/ML IJ SOLN
INTRAMUSCULAR | Status: AC
  Filled 2018-12-09: qty 1

## 2018-12-09 MED ORDER — OXYCODONE HCL ER 10 MG PO T12A
10.0000 mg | EXTENDED_RELEASE_TABLET | Freq: Two times a day (BID) | ORAL | Status: DC
  Administered 2018-12-09 – 2018-12-10 (×3): 10 mg via ORAL
  Filled 2018-12-09 (×3): qty 1

## 2018-12-09 MED ORDER — TRANEXAMIC ACID 1000 MG/10ML IV SOLN
INTRAVENOUS | Status: DC | PRN
  Administered 2018-12-09: 2000 mg via TOPICAL

## 2018-12-09 MED ORDER — METHOCARBAMOL 1000 MG/10ML IJ SOLN
500.0000 mg | Freq: Four times a day (QID) | INTRAVENOUS | Status: DC | PRN
  Filled 2018-12-09: qty 5

## 2018-12-09 SURGICAL SUPPLY — 49 items
BAG DECANTER FOR FLEXI CONT (MISCELLANEOUS) ×3 IMPLANT
CELLS DAT CNTRL 66122 CELL SVR (MISCELLANEOUS) ×1 IMPLANT
CHLORAPREP W/TINT 26 (MISCELLANEOUS) ×6 IMPLANT
COVER PERINEAL POST (MISCELLANEOUS) ×3 IMPLANT
COVER SURGICAL LIGHT HANDLE (MISCELLANEOUS) ×3 IMPLANT
CUP ACET PNNCL SECTR W/GRIP 56 (Hips) ×1 IMPLANT
DRAPE POUCH INSTRU U-SHP 10X18 (DRAPES) ×3 IMPLANT
DRAPE STERI IOBAN 125X83 (DRAPES) ×3 IMPLANT
DRAPE U-SHAPE 47X51 STRL (DRAPES) ×6 IMPLANT
DRSG AQUACEL AG ADV 3.5X10 (GAUZE/BANDAGES/DRESSINGS) ×3 IMPLANT
ELECT BLADE 4.0 EZ CLEAN MEGAD (MISCELLANEOUS) ×3
ELECT REM PT RETURN 9FT ADLT (ELECTROSURGICAL) ×3
ELECTRODE BLDE 4.0 EZ CLN MEGD (MISCELLANEOUS) ×1 IMPLANT
ELECTRODE REM PT RTRN 9FT ADLT (ELECTROSURGICAL) ×1 IMPLANT
GAUZE XEROFORM 5X9 LF (GAUZE/BANDAGES/DRESSINGS) ×3 IMPLANT
GLOVE BIOGEL PI IND STRL 7.0 (GLOVE) ×1 IMPLANT
GLOVE BIOGEL PI INDICATOR 7.0 (GLOVE) ×2
GLOVE SURG SYN 7.5  E (GLOVE) ×10
GLOVE SURG SYN 7.5 E (GLOVE) ×5 IMPLANT
GOWN SRG XL XLNG 56XLVL 4 (GOWN DISPOSABLE) ×1 IMPLANT
GOWN STRL NON-REIN XL XLG LVL4 (GOWN DISPOSABLE) ×2
GOWN STRL REUS W/ TWL XL LVL3 (GOWN DISPOSABLE) ×2 IMPLANT
GOWN STRL REUS W/TWL XL LVL3 (GOWN DISPOSABLE) ×4
HANDPIECE INTERPULSE COAX TIP (DISPOSABLE) ×2
HEAD CERAMIC DELTA 36 PLUS 1.5 (Hips) ×3 IMPLANT
HOOD PEEL AWAY FLYTE STAYCOOL (MISCELLANEOUS) ×6 IMPLANT
IV NS IRRIG 3000ML ARTHROMATIC (IV SOLUTION) ×3 IMPLANT
KIT BASIN OR (CUSTOM PROCEDURE TRAY) ×3 IMPLANT
MARKER SKIN DUAL TIP RULER LAB (MISCELLANEOUS) ×3 IMPLANT
NEEDLE SPNL 18GX3.5 QUINCKE PK (NEEDLE) ×3 IMPLANT
PACK TOTAL JOINT (CUSTOM PROCEDURE TRAY) ×3 IMPLANT
PACK UNIVERSAL I (CUSTOM PROCEDURE TRAY) ×3 IMPLANT
PINN SECTOR W/GRIP ACE CUP 56 (Hips) ×3 IMPLANT
PINNACLE ALTRX PLUS 4 N 36X56 (Hips) ×3 IMPLANT
RTRCTR WOUND ALEXIS 18CM MED (MISCELLANEOUS) ×3
SAW OSC TIP CART 19.5X105X1.3 (SAW) ×3 IMPLANT
SCREW 6.5MMX25MM (Screw) ×6 IMPLANT
SET HNDPC FAN SPRY TIP SCT (DISPOSABLE) ×1 IMPLANT
STEM FEMORAL SZ9 HIGH ACTIS (Stem) ×3 IMPLANT
SUT ETHIBOND 2 V 37 (SUTURE) ×3 IMPLANT
SUT ETHILON 2 0 PSLX (SUTURE) ×6 IMPLANT
SUT VIC AB 1 CT1 27 (SUTURE) ×4
SUT VIC AB 1 CT1 27XBRD ANBCTR (SUTURE) ×2 IMPLANT
SUT VIC AB 2-0 CT1 27 (SUTURE) ×6
SUT VIC AB 2-0 CT1 TAPERPNT 27 (SUTURE) ×3 IMPLANT
SYRINGE 60CC LL (MISCELLANEOUS) ×3 IMPLANT
TOWEL OR 17X26 10 PK STRL BLUE (TOWEL DISPOSABLE) ×3 IMPLANT
TRAY FOLEY MTR SLVR 14FR STAT (SET/KITS/TRAYS/PACK) ×3 IMPLANT
YANKAUER SUCT BULB TIP NO VENT (SUCTIONS) ×3 IMPLANT

## 2018-12-09 NOTE — Progress Notes (Signed)
PROGRESS NOTE  [name] [name]:[id number] DOB: June 15, 1951 DOA: 12/08/2018 PCP: [name], MD  HPI/Recap of past 5 hours: 68 year old male with medical history significant for Parkinson's disease depression who sustained a fall earlier afternoon of 12/08/2018 while walking down into his garage he missed a step.  Subjective: Patient seen and examined he is in bed his wife is at bedside.  Stated that he is pain is tolerable as long as he is not moving  Assessment/Plan: Principal Problem:   Closed left hip fracture ([place]) Active Problems:   MDD (major depressive disorder), recurrent episode, severe ([place])   Parkinson's disease ([place])   Closed fracture of neck of left femur ([place])   1.  Left femoral neck fracture secondary to mechanical fall status post fixation.  2.Parkinson disease medication was held due to surgery but it to be restarted today  3.  Depression continue Wellbutrin and Celexa  4.  Erectile dysfunction he is on sildenafil but this will be held while in the hospital  Code Status: Full  Severity of Illness: The appropriate patient status for this patient is INPATIENT. Inpatient status is judged to be reasonable and necessary in order to provide the required intensity of service to ensure the patient's safety. The patient's presenting symptoms, physical exam findings, and initial radiographic and laboratory data in the context of their chronic comorbidities is felt to place them at high risk for further clinical deterioration. Furthermore, it is not anticipated that the patient will be medically stable for discharge from the hospital within 2 midnights of admission. The following factors support the patient status of inpatient.   " Left hip fracture status post surgery  * I certify that at the point of admission it is my clinical judgment that the patient will require inpatient hospital care spanning beyond 2 midnights from the point of admission due to high  intensity of service, high risk for further deterioration and high frequency of surveillance required.*    Family Communication: Wife at bedside  Disposition Plan: Possible rehab   Consultant: Orthopedic  Leandrew Koyanagi, MD    Procedures:  Total left hip replacement  Antimicrobials:  None  DVT prophylaxis: Lovenox   Objective: Vitals:   12/09/18 1045 12/09/18 1100 12/09/18 1118 12/09/18 1617  BP: 101/65 104/68 114/68 93/61  Pulse: 69 68 68 77  Resp: 16 19 18    Temp:  (!) 97.2 F (36.2 C) (!) 97.3 F (36.3 C) 97.7 F (36.5 C)  TempSrc:   Oral Oral  SpO2: 92% 95% 91% 92%  Weight:      Height:        Intake/Output Summary (Last 24 hours) at 12/09/2018 1915 Last data filed at 12/09/2018 1622 Gross per 24 hour  Intake 2573.92 ml  Output 2150 ml  Net 423.92 ml   Filed Weights   12/08/18 1430  Weight: 133.8 kg   Body mass index is 37.88 kg/m.  Exam:  . General: 68 y.o. year-old male well developed well nourished in no acute distress.  Alert and oriented x3.  Slight painful distress . Cardiovascular: Regular rate and rhythm with no rubs or gallops.  No thyromegaly or JVD noted.   Marland Kitchen Respiratory: Clear to auscultation with no wheezes or rales. Good inspiratory effort. . Abdomen: Soft nontender nondistended with normal bowel sounds x4 quadrants. . Musculoskeletal: No lower extremity edema. 2/4 pulses in all 4 extremities.  Tender left hip . Skin: No ulcerative lesions noted or rashes, . Psychiatry: Mood is appropriate  for condition and setting    Data Reviewed: CBC: Recent Labs  Lab 12/08/18 1707 12/09/18 1217  WBC 10.5 14.0*  NEUTROABS 8.7*  --   HGB 12.9* 11.5*  HCT 40.0 34.8*  MCV 96.4 93.3  PLT 262 322   Basic Metabolic Panel: Recent Labs  Lab 12/08/18 1707 12/09/18 1217  NA 139  --   K 3.7  --   CL 108  --   CO2 24  --   GLUCOSE 93  --   BUN 17  --   CREATININE 1.13 1.18  CALCIUM 8.6*  --    GFR: Estimated Creatinine Clearance: 90.7  mL/min (by C-G formula based on SCr of 1.18 mg/dL). Liver Function Tests: No results for input(s): AST, ALT, ALKPHOS, BILITOT, PROT, ALBUMIN in the last 168 hours. No results for input(s): LIPASE, AMYLASE in the last 168 hours. No results for input(s): AMMONIA in the last 168 hours. Coagulation Profile: Recent Labs  Lab 12/08/18 1707  INR 1.1   Cardiac Enzymes: No results for input(s): CKTOTAL, CKMB, CKMBINDEX, TROPONINI in the last 168 hours. BNP (last 3 results) No results for input(s): PROBNP in the last 8760 hours. HbA1C: No results for input(s): HGBA1C in the last 72 hours. CBG: No results for input(s): GLUCAP in the last 168 hours. Lipid Profile: No results for input(s): CHOL, HDL, LDLCALC, TRIG, CHOLHDL, LDLDIRECT in the last 72 hours. Thyroid Function Tests: No results for input(s): TSH, T4TOTAL, FREET4, T3FREE, THYROIDAB in the last 72 hours. Anemia Panel: No results for input(s): VITAMINB12, FOLATE, FERRITIN, TIBC, IRON, RETICCTPCT in the last 72 hours. Urine analysis:    Component Value Date/Time   COLORURINE YELLOW 01/20/2012 1530   APPEARANCEUR CLEAR 01/20/2012 1530   LABSPEC 1.025 01/20/2012 1530   PHURINE 6.0 01/20/2012 1530   GLUCOSEU NEGATIVE 01/20/2012 1530   HGBUR NEGATIVE 01/20/2012 1530   BILIRUBINUR SMALL 01/20/2012 1530   KETONESUR NEGATIVE 01/20/2012 1530   UROBILINOGEN 1.0 01/20/2012 1530   NITRITE NEGATIVE 01/20/2012 1530   LEUKOCYTESUR NEGATIVE 01/20/2012 1530   Sepsis Labs: @LABRCNTIP (procalcitonin:4,lacticidven:4)  ) Recent Results (from the past 240 hour(s))  Surgical pcr screen     Status: None   Collection Time: 12/08/18 11:21 PM  Result Value Ref Range Status   MRSA, PCR NEGATIVE NEGATIVE Final   Staphylococcus aureus NEGATIVE NEGATIVE Final    Comment: (NOTE) The Xpert SA Assay (FDA approved for NASAL specimens in patients 56 years of age and older), is one component of a comprehensive surveillance program. It is not intended to  diagnose infection nor to guide or monitor treatment. Performed at [facility], [place] [address], [place], [place] [address]       Studies: Pelvis Portable  Result Date: 12/09/2018 CLINICAL DATA:  Status post left hip replacement EXAM: PORTABLE PELVIS 1 VIEWS COMPARISON:  None. FINDINGS: Left hip prosthesis is noted in satisfactory position. No acute soft tissue or bony abnormality is noted. IMPRESSION: Status post left hip replacement Electronically Signed   By: Inez Catalina M.D.   On: 12/09/2018 12:00   Dg C-arm 1-60 Min  Result Date: 12/09/2018 CLINICAL DATA:  Left femoral neck fracture EXAM: OPERATIVE LEFT HIP WITH PELVIS; DG C-ARM 61-120 MIN COMPARISON:  None. FLUOROSCOPY TIME:  Fluoroscopy Time:  38 seconds Radiation Exposure Index (if provided by the fluoroscopic device): Not available Number of Acquired Spot Images: 6 FINDINGS: Initial images again demonstrate the femoral neck fracture. Subsequent removal of the femoral head with placement of the acetabular component  is seen. Left femoral portion of the prosthesis is then placed in satisfactory position. IMPRESSION: Status post left hip replacement. Electronically Signed   By: Inez Catalina M.D.   On: 12/09/2018 12:00   Dg Hip Operative Unilat W Or W/o Pelvis Left  Result Date: 12/09/2018 CLINICAL DATA:  Left femoral neck fracture EXAM: OPERATIVE LEFT HIP WITH PELVIS; DG C-ARM 61-120 MIN COMPARISON:  None. FLUOROSCOPY TIME:  Fluoroscopy Time:  38 seconds Radiation Exposure Index (if provided by the fluoroscopic device): Not available Number of Acquired Spot Images: 6 FINDINGS: Initial images again demonstrate the femoral neck fracture. Subsequent removal of the femoral head with placement of the acetabular component is seen. Left femoral portion of the prosthesis is then placed in satisfactory position. IMPRESSION: Status post left hip replacement. Electronically Signed   By: Inez Catalina M.D.   On: 12/09/2018 12:00    Scheduled  Meds: . acetaminophen  1,000 mg Oral Q6H  . buPROPion  300 mg Oral Daily   And  . buPROPion  150 mg Oral Q1500  . carbidopa-levodopa  1-2 tablet Oral TID  . citalopram  40 mg Oral Daily  . docusate sodium  100 mg Oral BID  . [START ON 12/10/2018] enoxaparin (LOVENOX) injection  40 mg Subcutaneous Q24H  . entacapone  200 mg Oral TID  . fesoterodine  4 mg Oral Daily  . linaclotide  290 mcg Oral QAC breakfast  . oxyCODONE  10 mg Oral Q12H  . rOPINIRole  1 mg Oral TID  . tranexamic acid (CYKLOKAPRON) topical -INTRAOP  2,000 mg Topical Once    Continuous Infusions: . sodium chloride    . sodium chloride 75 mL/hr at 12/09/18 1530  .  ceFAZolin (ANCEF) IV Stopped (12/09/18 1405)  . methocarbamol (ROBAXIN) IV       LOS: 1 day     Cristal Deer, MD [facility]  To reach me or the doctor on call, go to: [email or web address] Password [email or web address]  12/09/2018, 7:15 PM

## 2018-12-09 NOTE — Anesthesia Procedure Notes (Signed)
Spinal  Patient location during procedure: OR Start time: 12/09/2018 7:40 AM End time: 12/09/2018 7:45 AM Staffing Anesthesiologist: Murvin Natal, MD Performed: anesthesiologist  Preanesthetic Checklist Completed: patient identified, surgical consent, pre-op evaluation, timeout performed, IV checked, risks and benefits discussed and monitors and equipment checked Spinal Block Patient position: sitting Prep: DuraPrep Patient monitoring: cardiac monitor, continuous pulse ox and blood pressure Approach: left paramedian Location: L4-5 Injection technique: single-shot Needle Needle type: Pencan  Needle gauge: 24 G Needle length: 9 cm Assessment Sensory level: T10 Additional Notes Functioning IV was confirmed and monitors were applied. Sterile prep and drape, including hand hygiene and sterile gloves were used. The patient was positioned and the spine was prepped. The skin was anesthetized with lidocaine.  Free flow of clear CSF was obtained prior to injecting local anesthetic into the CSF.  The spinal needle aspirated freely following injection.  The needle was carefully withdrawn.  The patient tolerated the procedure well.

## 2018-12-09 NOTE — Anesthesia Preprocedure Evaluation (Addendum)
Anesthesia Evaluation  Patient identified by MRN, date of birth, ID band Patient awake    Reviewed: Allergy & Precautions, NPO status , Patient's Chart, lab work & pertinent test results  Airway Mallampati: IV  TM Distance: >3 FB Neck ROM: Full    Dental no notable dental hx.    Pulmonary neg pulmonary ROS,    Pulmonary exam normal breath sounds clear to auscultation       Cardiovascular negative cardio ROS Normal cardiovascular exam Rhythm:Regular Rate:Normal  ECG: SR, rate 84   Neuro/Psych  Headaches, PSYCHIATRIC DISORDERS Depression Parkinson's    GI/Hepatic negative GI ROS, Neg liver ROS,   Endo/Other  negative endocrine ROS  Renal/GU negative Renal ROS     Musculoskeletal negative musculoskeletal ROS (+)   Abdominal   Peds  Hematology negative hematology ROS (+)   Anesthesia Other Findings LEFT HIP FRACTURE  Reproductive/Obstetrics                             Anesthesia Physical Anesthesia Plan  ASA: III  Anesthesia Plan: Spinal   Post-op Pain Management:    Induction: Intravenous  PONV Risk Score and Plan: 1 and Ondansetron, Dexamethasone and Treatment may vary due to age or medical condition  Airway Management Planned: Natural Airway  Additional Equipment:   Intra-op Plan:   Post-operative Plan:   Informed Consent: I have reviewed the patients History and Physical, chart, labs and discussed the procedure including the risks, benefits and alternatives for the proposed anesthesia with the patient or authorized representative who has indicated his/her understanding and acceptance.     Dental advisory given  Plan Discussed with: CRNA  Anesthesia Plan Comments:        Anesthesia Quick Evaluation

## 2018-12-09 NOTE — Progress Notes (Signed)
No assigned PT today, per order PT to start 3/9 0700, offered to call rehab for PT to be assigned but Pt refused and would wait until tomorrow.

## 2018-12-09 NOTE — Plan of Care (Signed)
  Problem: Coping: Goal: Level of anxiety will decrease Outcome: Progressing   Problem: Pain Managment: Goal: General experience of comfort will improve Outcome: Progressing   Problem: Safety: Goal: Ability to remain free from injury will improve Outcome: Progressing   

## 2018-12-09 NOTE — Op Note (Signed)
TOTAL HIP ARTHROPLASTY ANTERIOR APPROACH  Procedure Note [name] [name]   [id number]  Pre-op Diagnosis: LEFT femoral neck FRACTURE     Post-op Diagnosis: same   Operative Procedures  1. Total hip replacement; Left hip; uncemented [id number]   Personnel  Surgeon(s): Leandrew Koyanagi, MD  Assist: Madalyn Rob, PA-C; necessary for the timely completion of procedure and due to complexity of procedure.   Anesthesia: spinal  Prosthesis: Depuy Acetabulum: Pinnacle 56 mm Femur: Actis 9 HO Head: 36 mm size: +1.5 Liner: +4 neutral Bearing Type: ceramic on poly  Total Hip Arthroplasty (Anterior Approach) Op Note:  After informed consent was obtained and the operative extremity marked in the holding area, the patient was brought back to the operating room and placed supine on the HANA table. Next, the operative extremity was prepped and draped in normal sterile fashion. Surgical timeout occurred verifying patient identification, surgical site, surgical procedure and administration of antibiotics.  A modified anterior Smith-Peterson approach to the hip was performed, using the interval between tensor fascia lata and sartorius.  Dissection was carried bluntly down onto the anterior hip capsule. The lateral femoral circumflex vessels were identified and coagulated. A capsulotomy was performed and the capsular flaps tagged for later repair.  Fluoroscopy was utilized to prepare for the femoral neck cut. The neck osteotomy was performed. The femoral head was removed, the acetabular rim was cleared of soft tissue and attention was turned to reaming the acetabulum.  Sequential reaming was performed under fluoroscopic guidance. We reamed to a size 55 mm, and then impacted the acetabular shell. The liner was then placed after irrigation and attention turned to the femur.  After placing the femoral hook, the leg was taken to externally rotated, extended and adducted position taking care to perform  soft tissue releases to allow for adequate mobilization of the femur. Soft tissue was cleared from the shoulder of the greater trochanter and the hook elevator used to improve exposure of the proximal femur. Sequential broaching performed up to a size 9. Trial neck and head were placed. The leg was brought back up to neutral and the construct reduced. The position and sizing of components, offset and leg lengths were checked using fluoroscopy. Stability of the  construct was checked in extension and external rotation without any subluxation or impingement of prosthesis. We dislocated the prosthesis, dropped the leg back into position, removed trial components, and irrigated copiously. The final stem and head was then placed, the leg brought back up, the system reduced and fluoroscopy used to verify positioning.  We irrigated, obtained hemostasis and closed the capsule using #2 ethibond suture.  One gram of vancomycin powder was placed in the surgical bed. The fascia was closed with #1 vicryl plus, the deep fat layer was closed with 0 vicryl, the subcutaneous layers closed with 2.0 Vicryl Plus and the skin closed with 2.0 nylon and steri strips. A sterile dressing was applied. The patient was awakened in the operating room and taken to recovery in stable condition.  All sponge, needle, and instrument counts were correct at the end of the case.   Position: supine  Complications: none.  Time Out: performed   Drains/Packing: none  Estimated blood loss: see anesthesia record  Returned to Recovery Room: in good condition.   Antibiotics: yes   Mechanical VTE (DVT) Prophylaxis: sequential compression devices, TED thigh-high  Chemical VTE (DVT) Prophylaxis: lovenox   Fluid Replacement: see anesthesia record  Specimens Removed: 1 to pathology  Sponge and Instrument Count Correct? yes   PACU: portable radiograph - low AP   Plan/RTC: Return in 2 weeks for staple removal. Weight Bearing/Load Lower  Extremity: full  Hip precautions: none Suture Removal: 2 weeks   N. Eduard Roux, MD [name] 10:00 AM     Implant Name Type Inv. Item Serial No. Manufacturer Lot No. LRB No. Used  PINN SECTOR W/GRIP ACE CUP 56 - [id number] Hips PINN SECTOR W/GRIP ACE CUP 56  DEPUY SYN[employer]ES [id number] Left 1  SCREW 6.5MMX25MM - [id number] Screw SCREW 6.5MMX25MM  DEPUY [employer]ES [id number] Left 1  PINNACLE ALTRX PLUS 4 N 36X56 - [id number] Hips PINNACLE ALTRX PLUS 4 N 36X56  DEPU[employer]ES [id number] Left 1  SCREW 6.5MMX25MM - [id number] Screw SCREW 6.5MMX25MM  DEPU[employer] [id number] Left 1  HEAD CERAMIC DELTA 36 PLUS 1.5 - [id number] Hips HEAD CERAMIC DELTA 36 PLUS 1.5  DEPUY [employer]NTHES [id number] Left 1  FEMORAL STEM     [id number] Left 1

## 2018-12-09 NOTE — Anesthesia Postprocedure Evaluation (Signed)
Anesthesia Post Note  Patient: [name] [name]  Procedure(s) Performed: TOTAL HIP ARTHROPLASTY ANTERIOR APPROACH (Left Hip)     Patient location during evaluation: PACU Anesthesia Type: Spinal Level of consciousness: oriented and awake and alert Pain management: pain level controlled Vital Signs Assessment: post-procedure vital signs reviewed and stable Respiratory status: spontaneous breathing, respiratory function stable and patient connected to nasal cannula oxygen Cardiovascular status: blood pressure returned to baseline and stable Postop Assessment: no headache, no backache, no apparent nausea or vomiting and spinal receding Anesthetic complications: no    Last Vitals:  Vitals:   12/09/18 1118 12/09/18 1617  BP: 114/68 93/61  Pulse: 68 77  Resp: 18   Temp: (!) 36.3 C 36.5 C  SpO2: 91% 92%    Last Pain:  Vitals:   12/09/18 1617  TempSrc: Oral  PainSc:                  Doreene Forrey P Shevawn Langenberg

## 2018-12-09 NOTE — Discharge Instructions (Signed)
° ° °  1. Change dressings as needed °2. May shower but keep incisions covered and dry °3. Take lovenox to prevent blood clots °4. Take stool softeners as needed °5. Take pain meds as needed ° °

## 2018-12-09 NOTE — Transfer of Care (Signed)
Immediate Anesthesia Transfer of Care Note  Patient: [name] [name]  Procedure(s) Performed: TOTAL HIP ARTHROPLASTY ANTERIOR APPROACH (Left Hip)  Patient Location: PACU  Anesthesia Type:Spinal  Level of Consciousness: awake and drowsy  Airway & Oxygen Therapy: Patient Spontanous Breathing and Patient connected to nasal cannula oxygen  Post-op Assessment: Report given to RN and Post -op Vital signs reviewed and stable  Post vital signs: Reviewed and stable  Last Vitals:  Vitals Value Taken Time  BP 147/121 12/09/2018 10:34 AM  Temp    Pulse 64 12/09/2018 10:36 AM  Resp 14 12/09/2018 10:36 AM  SpO2 98 % 12/09/2018 10:36 AM  Vitals shown include unvalidated device data.  Last Pain:  Vitals:   12/09/18 0646  TempSrc:   PainSc: 4          Complications: No apparent anesthesia complications

## 2018-12-09 NOTE — Anesthesia Procedure Notes (Signed)
Procedure Name: MAC Date/Time: 12/09/2018 7:36 AM Performed by: Renato Shin, CRNA Pre-anesthesia Checklist: Patient identified, Emergency Drugs available, Suction available and Patient being monitored Patient Re-evaluated:Patient Re-evaluated prior to induction Oxygen Delivery Method: Nasal cannula Preoxygenation: Pre-oxygenation with 100% oxygen Induction Type: IV induction Placement Confirmation: positive ETCO2 and breath sounds checked- equal and bilateral Dental Injury: Teeth and Oropharynx as per pre-operative assessment

## 2018-12-09 NOTE — Plan of Care (Signed)
  Problem: Self-Concept: Goal: Ability to maintain and perform role responsibilities to the fullest extent possible will improve Outcome: Progressing   Problem: Pain Management: Goal: Pain level will decrease Outcome: Progressing   Problem: Clinical Measurements: Goal: Ability to maintain clinical measurements within normal limits will improve Outcome: Progressing   Problem: Activity: Goal: Risk for activity intolerance will decrease Outcome: Progressing   Problem: Nutrition: Goal: Adequate nutrition will be maintained Outcome: Progressing

## 2018-12-10 ENCOUNTER — Encounter (HOSPITAL_COMMUNITY): Payer: Self-pay | Admitting: Orthopaedic Surgery

## 2018-12-10 LAB — CBC
HCT: 29.1 % — ABNORMAL LOW (ref 39.0–52.0)
Hemoglobin: 9.8 g/dL — ABNORMAL LOW (ref 13.0–17.0)
MCH: 30.8 pg (ref 26.0–34.0)
MCHC: 33.7 g/dL (ref 30.0–36.0)
MCV: 91.5 fL (ref 80.0–100.0)
NRBC: 0 % (ref 0.0–0.2)
Platelets: 202 10*3/uL (ref 150–400)
RBC: 3.18 MIL/uL — ABNORMAL LOW (ref 4.22–5.81)
RDW: 12.8 % (ref 11.5–15.5)
WBC: 13.9 10*3/uL — ABNORMAL HIGH (ref 4.0–10.5)

## 2018-12-10 LAB — BASIC METABOLIC PANEL
Anion gap: 7 (ref 5–15)
BUN: 15 mg/dL (ref 8–23)
CALCIUM: 8.2 mg/dL — AB (ref 8.9–10.3)
CO2: 23 mmol/L (ref 22–32)
Chloride: 106 mmol/L (ref 98–111)
Creatinine, Ser: 1.24 mg/dL (ref 0.61–1.24)
GFR calc Af Amer: >60 mL/min (ref >60)
Glucose, Bld: 139 mg/dL — ABNORMAL HIGH (ref 70–99)
Potassium: 4.1 mmol/L (ref 3.5–5.1)
Sodium: 136 mmol/L (ref 135–145)

## 2018-12-10 MED ORDER — FUROSEMIDE 10 MG/ML IJ SOLN
20.0000 mg | Freq: Once | INTRAMUSCULAR | Status: AC
  Administered 2018-12-10: 20 mg via INTRAVENOUS
  Filled 2018-12-10: qty 2

## 2018-12-10 MED ORDER — OXYCODONE HCL 5 MG PO TABS
5.0000 mg | ORAL_TABLET | ORAL | Status: DC | PRN
  Administered 2018-12-10 – 2018-12-11 (×3): 10 mg via ORAL
  Filled 2018-12-10 (×3): qty 2

## 2018-12-10 NOTE — Evaluation (Addendum)
Physical Therapy Evaluation Patient Details Name: [name] [name] MRN: [id number] DOB: 19-Dec-1950 Today's Date: 12/10/2018   History of Present Illness  Pt is a 68 yo male admitted secondary to fall at home, with L hip fracture. He is now s/p L THA with an anterior approach. PMH significant for Parkinson's Disease, depression.  Clinical Impression  Pt received in bed, willing to participate in therapy. Pt has a very supportive wife, who is in the room with him. Pt is limited by pain in L hip right now as well as by his altered tone related to Parkinson's. Bed mobility totalA, sit to stand improved with repetition (practiced sit to stand from [place] seat x3, from chair x1). Increased time with all mobility to allow pt to relax for tone management. Pt is very motivated to participate and presents with multi-system involvement, so recommending CIR so that pt can maximize his independence with functional mobility, reduce fall risk and prepare to go home safely. Predict he will do well with intensive rehab services. He will continue to benefit from skilled acute PT services in order to improve his functional mobility and ensure safe DC.    Follow Up Recommendations CIR(if unable to go to CIR, pt will require SNF)    Equipment Recommendations  3in1 (PT);Rolling walker with 5" wheels    Recommendations for Other Services Rehab consult     Precautions / Restrictions Precautions Precautions: Fall Restrictions Weight Bearing Restrictions: Yes LLE Weight Bearing: Weight bearing as tolerated      Mobility  Bed Mobility Overal bed mobility: Needs Assistance Bed Mobility: Supine to Sit     Supine to sit: HOB elevated;Total assist;+2 for physical assistance     General bed mobility comments: Pt required totalA for bed mobility, maxA for LE management, hip movement and trunk elevation, max cues for hand placement and rolling; very painful and overall trunk extensor tone throughout, especially  once sitting EOB- required manual assist to bend knees L >R   Transfers Overall transfer level: Needs assistance   Transfers: Sit to/from Stand Sit to Stand: Max assist;Min guard;From elevated surface         General transfer comment: MaxA+2 for boost from elevated bed using [place]- assist for hip extension and cues for stand upright; MinA to min guard for stand from [place] seat- progressed to requiring less assist as pt became more confident and less fearful of falling; Min guard of 2 for pt to control descent to chair using [place] for UE support; heavy reliance on UEs for sit to/from stand  Ambulation/Gait             General Gait Details: did not attempt today  Stairs            Wheelchair Mobility    Modified Rankin (Stroke Patients Only)       Balance Overall balance assessment: Needs assistance Sitting-balance support: Bilateral upper extremity supported;Feet supported Sitting balance-Leahy Scale: Poor Sitting balance - Comments: Initially pt required mod-maxA to maintain upright and achieve position where he could maintain upright without assistance due to strong extensor tone; progressed to requiring min guard for sitting balance once in optimal position Postural control: Posterior lean Standing balance support: Bilateral upper extremity supported Standing balance-Leahy Scale: Poor Standing balance comment: Pt able to stand in [place] with bil UE support and min guard  Pertinent Vitals/Pain Pain Assessment: Faces Faces Pain Scale: Hurts whole lot Pain Location: L hip Pain Descriptors / Indicators: Aching;Operative site guarding;Grimacing;Moaning Pain Intervention(s): Limited activity within patient's tolerance;Monitored during session;Repositioned;Relaxation    Home Living Family/patient expects to be discharged to:: Private residence Living Arrangements: Spouse/significant other Available Help at Discharge:  Family;Available PRN/intermittently Type of Home: House Home Access: Stairs to enter Entrance Stairs-Rails: Right Entrance Stairs-Number of Steps: 8-9 Home Layout: Two level;Bed/bath upstairs Home Equipment: Cane - quad;Cane - single point Additional Comments: Pt sleeps in lift chair; fall down bottom step in garage resulting in hip fracture    Prior Function Level of Independence: Needs assistance   Gait / Transfers Assistance Needed: Mod I, uses a SPC occasionally  ADL's / Homemaking Assistance Needed: Pt's wife assists with some ADLs        Hand Dominance        Extremity/Trunk Assessment   Upper Extremity Assessment Upper Extremity Assessment: Defer to OT evaluation    Lower Extremity Assessment Lower Extremity Assessment: LLE deficits/detail;RLE deficits/detail RLE Deficits / Details: rigid; increased tone LLE Deficits / Details: unable to initiate heel slide in supine due to pain; overall rigidity slows movement down LLE: Unable to fully assess due to pain LLE Coordination: decreased gross motor       Communication   Communication: Other (comment)(requires repeated, clear cuing)  Cognition Arousal/Alertness: Awake/alert Behavior During Therapy: WFL for tasks assessed/performed;Anxious Overall Cognitive Status: History of cognitive impairments - at baseline                                 General Comments: Pt with Parkinson's disease, potential mild cognitive deficits at baseline; required multiple, simple cues for movement; responds to external cuing      General Comments      Exercises     Assessment/Plan    PT Assessment Patient needs continued PT services  PT Problem List Decreased strength;Decreased balance;Decreased cognition;Decreased knowledge of precautions;Pain;Impaired tone;Decreased mobility;Decreased knowledge of use of DME;Decreased activity tolerance;Decreased coordination;Decreased safety awareness       PT Treatment  Interventions DME instruction;Functional mobility training;Balance training;Patient/family education;Gait training;Therapeutic activities;Neuromuscular re-education;Stair training;Therapeutic exercise    PT Goals (Current goals can be found in the Care Plan section)  Acute Rehab PT Goals Patient Stated Goal: decrease pain and improve movement PT Goal Formulation: With patient/family Time For Goal Achievement: 12/24/18 Potential to Achieve Goals: Good    Frequency Min 5X/week   Barriers to discharge Inaccessible home environment      Co-evaluation               AM-PAC PT "6 Clicks" Mobility  Outcome Measure Help needed turning from your back to your side while in a flat bed without using bedrails?: A Lot Help needed moving from lying on your back to sitting on the side of a flat bed without using bedrails?: Total Help needed moving to and from a bed to a chair (including a wheelchair)?: A Lot Help needed standing up from a chair using your arms (e.g., wheelchair or bedside chair)?: A Lot Help needed to walk in hospital room?: Total Help needed climbing 3-5 steps with a railing? : Total 6 Click Score: 9    End of Session Equipment Utilized During Treatment: Gait belt Activity Tolerance: Patient tolerated treatment well;Patient limited by pain Patient left: in chair;with call bell/phone within reach;with family/visitor present Nurse Communication: Mobility status;Need for lift equipment PT Visit  Diagnosis: History of falling (Z91.81);Unsteadiness on feet (R26.81);Muscle weakness (generalized) (M62.81);Pain;Other symptoms and signs involving the nervous system (R29.898) Pain - Right/Left: Left Pain - part of body: Hip    Time:  -      Charges:              Ronnell Guadalajara, SPT   Ronnell Guadalajara 12/10/2018, 4:16 PM

## 2018-12-10 NOTE — Progress Notes (Signed)
PROGRESS NOTE  [name] [name] RXV:[id number] DOB: [birth year] DOA: 12/08/2018 PCP: [name], MD  HPI/Recap of past 73 hours: 68 year old male with medical history significant for Parkinson's disease depression who sustained a fall earlier afternoon of 12/08/2018 while walking down into his garage he missed a step.  Subjective: Patient seen and examined he is in bed his wife is at bedside.  Stated that he is pain is tolerable as long as he is not moving  Assessment/Plan:  1.  Left hip femoral neck fracture -Following mechanical fall -Status post left total hip arthroplasty by Dr. Erlinda Hong 3/8 -PT today -Lovenox for DVT prophylaxis -Will likely need short-term rehab  2.  Parkinson's disease -Continue Sinemet and Requip per home regimen  3.  Depression -Continue Wellbutrin and Cymbalta  4.  Acute blood loss anemia, monitor  5.  Edema -Likely iatrogenic from perioperative and Intra-Op fluid administration -Discontinue IV fluids, give low-dose Lasix x1 now  DVT prophylaxis: Lovenox  Code Status: Full Communication wife at bedside Disposition will likely need rehab, social work consulted  Consultant: Orthopedic  Leandrew Koyanagi, MD    Procedures:  Total left hip replacement  Antimicrobials:  None  Objective: Vitals:   12/09/18 1118 12/09/18 1617 12/09/18 2000 12/10/18 0422  BP: 114/68 93/61 (!) 91/41 115/60  Pulse: 68 77 82   Resp: 18     Temp: (!) 97.3 F (36.3 C) 97.7 F (36.5 C) 98.1 F (36.7 C) 98.2 F (36.8 C)  TempSrc: Oral Oral Oral Oral  SpO2: 91% 92% 93%   Weight:      Height:        Intake/Output Summary (Last 24 hours) at 12/10/2018 1153 Last data filed at 12/10/2018 0618 Gross per 24 hour  Intake 1693.92 ml  Output 950 ml  Net 743.92 ml   Filed Weights   12/08/18 1430  Weight: 133.8 kg   Body mass index is 37.88 kg/m.  Exam:  Gen: Awake, Alert, Oriented X 3, no distress HEENT: PERRLA, Neck supple, no JVD Lungs: Good air  movement, clear CVS: RRR,No Gallops,Rubs or new Murmurs Abd: Soft, mildly distended, bowel sounds present Extremities: 1+ edema, left hip dressing noted . Skin: no new rashes    Data Reviewed: CBC: Recent Labs  Lab 12/08/18 1707 12/09/18 1217 12/10/18 0327  WBC 10.5 14.0* 13.9*  NEUTROABS 8.7*  --   --   HGB 12.9* 11.5* 9.8*  HCT 40.0 34.8* 29.1*  MCV 96.4 93.3 91.5  PLT 262 229 509   Basic Metabolic Panel: Recent Labs  Lab 12/08/18 1707 12/09/18 1217 12/10/18 0327  NA 139  --  136  K 3.7  --  4.1  CL 108  --  106  CO2 24  --  23  GLUCOSE 93  --  139*  BUN 17  --  15  CREATININE 1.13 1.18 1.24  CALCIUM 8.6*  --  8.2*   GFR: Estimated Creatinine Clearance: 86.4 mL/min (by C-G formula based on SCr of 1.24 mg/dL). Liver Function Tests: No results for input(s): AST, ALT, ALKPHOS, BILITOT, PROT, ALBUMIN in the last 168 hours. No results for input(s): LIPASE, AMYLASE in the last 168 hours. No results for input(s): AMMONIA in the last 168 hours. Coagulation Profile: Recent Labs  Lab 12/08/18 1707  INR 1.1   Cardiac Enzymes: No results for input(s): CKTOTAL, CKMB, CKMBINDEX, TROPONINI in the last 168 hours. BNP (last 3 results) No results for input(s): PROBNP in the last 8760 hours. HbA1C: No results  for input(s): HGBA1C in the last 72 hours. CBG: No results for input(s): GLUCAP in the last 168 hours. Lipid Profile: No results for input(s): CHOL, HDL, LDLCALC, TRIG, CHOLHDL, LDLDIRECT in the last 72 hours. Thyroid Function Tests: No results for input(s): TSH, T4TOTAL, FREET4, T3FREE, THYROIDAB in the last 72 hours. Anemia Panel: No results for input(s): VITAMINB12, FOLATE, FERRITIN, TIBC, IRON, RETICCTPCT in the last 72 hours. Urine analysis:    Component Value Date/Time   COLORURINE YELLOW 01/20/2012 1530   APPEARANCEUR CLEAR 01/20/2012 1530   LABSPEC 1.025 01/20/2012 1530   PHURINE 6.0 01/20/2012 1530   GLUCOSEU NEGATIVE 01/20/2012 1530   HGBUR  NEGATIVE 01/20/2012 1530   BILIRUBINUR SMALL 01/20/2012 1530   KETONESUR NEGATIVE 01/20/2012 1530   UROBILINOGEN 1.0 01/20/2012 1530   NITRITE NEGATIVE 01/20/2012 1530   LEUKOCYTESUR NEGATIVE 01/20/2012 1530   Sepsis Labs: @LABRCNTIP (procalcitonin:4,lacticidven:4)  ) Recent Results (from the past 240 hour(s))  Surgical pcr screen     Status: None   Collection Time: 12/08/18 11:21 PM  Result Value Ref Range Status   MRSA, PCR NEGATIVE NEGATIVE Final   Staphylococcus aureus NEGATIVE NEGATIVE Final    Comment: (NOTE) The Xpert SA Assay (FDA approved for NASAL specimens in patients 26 years of age and older), is one component of a comprehensive surveillance program. It is not intended to diagnose infection nor to guide or monitor treatment. Performed at [facility], [place] [address], [place], [place] [address]       Studies: No results found.  Scheduled Meds: . buPROPion  300 mg Oral Daily   And  . buPROPion  150 mg Oral Q1500  . carbidopa-levodopa  1-2 tablet Oral TID  . citalopram  40 mg Oral Daily  . docusate sodium  100 mg Oral BID  . enoxaparin (LOVENOX) injection  40 mg Subcutaneous Q24H  . entacapone  200 mg Oral TID  . fesoterodine  4 mg Oral Daily  . linaclotide  290 mcg Oral QAC breakfast  . rOPINIRole  1 mg Oral TID  . tranexamic acid (CYKLOKAPRON) topical -INTRAOP  2,000 mg Topical Once    Continuous Infusions: . methocarbamol (ROBAXIN) IV       LOS: 2 days     Domenic Polite, MD [facility]  12/10/2018, 11:53 AM

## 2018-12-10 NOTE — Progress Notes (Signed)
Patients wife was quite angry this evening stating that she has not been told anything about his post op care. She wants him to have inpatient rehab if possible. She was informed about the case manager and social worker who will be working with her and her husband. She seemed satisfied after our discussion.

## 2018-12-10 NOTE — Plan of Care (Signed)
  Problem: Clinical Measurements: Goal: Postoperative complications will be avoided or minimized Outcome: Progressing   Problem: Self-Concept: Goal: Ability to maintain and perform role responsibilities to the fullest extent possible will improve Outcome: Progressing   Problem: Pain Management: Goal: Pain level will decrease Outcome: Progressing   Problem: Clinical Measurements: Goal: Ability to maintain clinical measurements within normal limits will improve Outcome: Progressing   Problem: Activity: Goal: Risk for activity intolerance will decrease Outcome: Progressing

## 2018-12-10 NOTE — PMR Pre-admission (Signed)
PMR Admission Coordinator Pre-Admission Assessment  Patient: [name] [name] is an 68 y.o., male MRN: [id number] DOB: [birth year] Height: _0  (188 cm) Weight: 133.8 kg  Insurance Information HMO:     PPO:      PCP:      IPA:      80/20:      OTHER:  PRIMARY: Medicare A and B      Policy#: [id number]      Subscriber: patient CM Name:       Phone#:      Fax#:  Pre-Cert#:    Employer:  Benefits:  Phone #:  [phone]    Name: [employer] 12/10/2018 Eff. Date: 12/01/2017     Deduct: $1408      Out of Pocket Max: none      Life Max: n/a CIR: 100%      SNF: 20 full days Outpatient: 80%     Co-Pay: 20%  Home Health: 100%      Co-Pay:  DME: 80%     Co-Pay: 20% Providers: patient's choice  SECONDARY: [employer]      Policy#: [id number]      Subscriber: patient CM Name:       Phone#:      Fax#:  Pre-Cert#:       Employer:  Benefits:  Phone #:  [phone]    Name:  Eff. Date:      Deduct:       Out of Pocket Max:       Life Max:  CIR:       SNF:  Outpatient:      Co-Pay:  Home Health:       Co-Pay:  DME:      Co-Pay:   Medicaid Application Date:       Case Manager:  Disability Application Date:       Case Worker:   Emergency Contact Information Contact Information    Name Relation Home Work Mobile   Dexter,[name] Other [phone]  [phone]   No name specified          Current Medical History  Patient Admitting Diagnosis: L hip fracture History of Present Illness: [name] [name] is a 68 year old right-handed male with history of Parkinson's disease followed by neurology services Dr. [name] Tat  maintained on Sinemet 25-100 milligrams 2 tabs in the morning 2 tabs in the afternoon and 1 tablet in the evening ,Comtan 200 mg 3 times a day, depression maintained on Wellbutrin 150 mg every morning and 300 mg every evening Celexa 40 mg daily, chronic idiopathic constipation. Presented 12/08/2018 after a fall while walking down into his  garage. He states he missed the last step. He did not hit his head or black out. Patient noted significant left hip pain. X-rays and imaging revealed left femoral neck fracture. Underwent total hip replacement uncemented 12/09/2018 per Dr.Xu with anterior approach. Hospital course pain management and weightbearing as tolerated left lower extremity. Acute blood loss anemia 9.3. subcutaneous Lovenox for DVT prophylaxis. Therapy evaluations completed with recommendations of physical medicine rehabilitation consult.   Patient's medical record from [facility] has been reviewed by the rehabilitation admission coordinator and physician.   Past Medical History  Past Medical History:  Diagnosis Date  .  OSA (obstructive sleep apnea)    npsg 2012:  AHI 67/hr. Auto titration 2012:  Optimal pressure 12cm.   . APPENDECTOMY, HX OF 02/18/2008   Qualifier: Diagnosis of  By: [place], [place]    .  Cardiac murmur    as a child  . Chronic idiopathic constipation   . Depression   . Headache(784.0)   . HEADACHES, HX OF 02/18/2008   Qualifier: Diagnosis of  By: Danny Lawless CMA, [place]    . Parkinson disease ([place]) 11/2014  . Sinus mucosal thickening    pt unable to lay flat  . Streptococcal meningitis    as an infant    Family History   family history includes Alcohol abuse in his brother; Drug abuse in his brother; Lung cancer in his father; Seizures in his daughter.  Prior Rehab/Hospitalizations Has the patient had major surgery during 100 days prior to admission? Yes, total hip arthroplasty (L) on 12/09/2018    Current Medications  Current Facility-Administered Medications:  .  acetaminophen (TYLENOL) tablet 325-650 mg, 325-650 mg, Oral, Q6H PRN, Leandrew Koyanagi, MD .  alum & mag hydroxide-simeth (MAALOX/MYLANTA) 200-200-20 MG/5ML suspension 30 mL, 30 mL, Oral, Q4H PRN, Leandrew Koyanagi, MD, 30 mL at 12/10/18 2154 .  buPROPion (WELLBUTRIN XL) 24 hr tablet 300 mg, 300 mg, Oral, Daily, 300 mg at  12/11/18 0637 **AND** buPROPion (WELLBUTRIN XL) 24 hr tablet 150 mg, 150 mg, Oral, Q1500, Cristal Deer, MD, 150 mg at 12/10/18 1932 .  carbidopa-levodopa (SINEMET IR) 25-100 MG per tablet immediate release 1-2 tablet, 1-2 tablet, Oral, TID, Leandrew Koyanagi, MD, 1 tablet at 12/11/18 1154 .  citalopram (CELEXA) tablet 40 mg, 40 mg, Oral, Daily, Leandrew Koyanagi, MD, 40 mg at 12/10/18 1932 .  docusate sodium (COLACE) capsule 100 mg, 100 mg, Oral, BID, Leandrew Koyanagi, MD, 100 mg at 12/11/18 8938 .  enoxaparin (LOVENOX) injection 40 mg, 40 mg, Subcutaneous, Q24H, Leandrew Koyanagi, MD, 40 mg at 12/11/18 1017 .  entacapone (COMTAN) tablet 200 mg, 200 mg, Oral, TID, Leandrew Koyanagi, MD, 200 mg at 12/11/18 5102 .  fesoterodine (TOVIAZ) tablet 4 mg, 4 mg, Oral, Daily, Leandrew Koyanagi, MD, 4 mg at 12/10/18 1816 .  HYDROmorphone (DILAUDID) injection 0.5-1 mg, 0.5-1 mg, Intravenous, Q4H PRN, Leandrew Koyanagi, MD .  linaclotide Rolan Lipa) capsule 290 mcg, 290 mcg, Oral, QAC breakfast, Leandrew Koyanagi, MD, 290 mcg at 12/11/18 [phone] .  magnesium citrate solution 1 Bottle, 1 Bottle, Oral, Once PRN, Leandrew Koyanagi, MD .  menthol-cetylpyridinium (CEPACOL) lozenge 3 mg, 1 lozenge, Oral, PRN **OR** phenol (CHLORASEPTIC) mouth spray 1 spray, 1 spray, Mouth/Throat, PRN, Leandrew Koyanagi, MD .  methocarbamol (ROBAXIN) tablet 500 mg, 500 mg, Oral, Q6H PRN, 500 mg at 12/11/18 0636 **OR** methocarbamol (ROBAXIN) 500 mg in dextrose 5 % 50 mL IVPB, 500 mg, Intravenous, Q6H PRN, Leandrew Koyanagi, MD .  methocarbamol (ROBAXIN) tablet 750 mg, 750 mg, Oral, Q8H PRN, Leandrew Koyanagi, MD, 750 mg at 12/11/18 1154 .  morphine 2 MG/ML injection 0.5 mg, 0.5 mg, Intravenous, Q2H PRN, Leandrew Koyanagi, MD, 0.5 mg at 12/09/18 0616 .  ondansetron (ZOFRAN) tablet 4 mg, 4 mg, Oral, Q6H PRN **OR** ondansetron (ZOFRAN) injection 4 mg, 4 mg, Intravenous, Q6H PRN, Leandrew Koyanagi, MD .  oxyCODONE (Oxy IR/ROXICODONE) immediate release tablet 5-10 mg, 5-10 mg, Oral, Q4H PRN,  Domenic Polite, MD, 10 mg at 12/11/18 0636 .  polyethylene glycol (MIRALAX / GLYCOLAX) packet 17 g, 17 g, Oral, Daily PRN, Leandrew Koyanagi, MD .  rOPINIRole (REQUIP) tablet 1 mg, 1 mg, Oral, TID, Leandrew Koyanagi, MD, 1 mg at 12/11/18 1154 .  sorbitol 70 % solution 30 mL, 30 mL,  Oral, Daily PRN, Leandrew Koyanagi, MD .  tranexamic acid (CYKLOKAPRON) 2,000 mg in sodium chloride 0.9 % 50 mL Topical Application, 7,253 mg, Topical, Once, Leandrew Koyanagi, MD  Patients Current Diet:   Diet Order            Diet regular Room service appropriate? Yes; Fluid consistency: Thin  Diet effective now              Precautions / Restrictions Precautions Precautions: Fall Restrictions Weight Bearing Restrictions: Yes LLE Weight Bearing: Weight bearing as tolerated   Has the patient had 2 or more falls or a fall with injury in the past year?Yes, pt endorses at least 2 falls this year, including the one that led to this admission  Prior Activity Level Community (5-7x/wk): Was very active, going to the gym several times/week, not driving 2/2 parkinson's  Prior Functional Level Do you want Prior Function Level of Independence: Needs assistance Gait / Transfers Assistance Needed: Mod I, uses a SPC occasionally ADL's / Homemaking Assistance Needed: Pt's wife assists with some ADLs Communication / Swallowing Assistance Needed: Independent from other? Self Care: Did the patient need help bathing, dressing, using the toilet or eating?  Needed some help  Indoor Mobility: Did the patient need assistance with walking from room to room (with or without device)? Independent  Stairs: Did the patient need assistance with internal or external stairs (with or without device)? Independent  Functional Cognition: Did the patient need help planning regular tasks such as shopping or remembering to take medications? Needed some help  Home Assistive Devices / Equipment Home Assistive Devices/Equipment: Cane (specify quad or  straight) Home Equipment: Cane - quad, Cane - single point  Prior Device Use: Indicate devices/aids used by the patient prior to current illness, exacerbation or injury? occasionally used SPC  Prior Functional Level     Prior Functional Level Current Functional Level  Bed Mobility  Independent Total Assistance  Transfers  Independent Max assistance +2  Mobility - Walk/Wheelchair  Independent    Mobility - Ambulation/Gait  Independent 5 feet, Total assist, +2 physical assistance, +2 safety/equipment  Upper Body Dressing  Independent    Lower Body Dressing  Needed occasional assist Total assistance  Grooming  Independent Moderate assistance, Sitting  Eating/Drinking  Independent Moderate assistance  Toilet Transfer  Independent +2 for physical assistance, Maximal assistance  Bladder Continence  Occasional incontinence over night  occasional incontinence  Bowel Management   Continent   continent, last BM 12/07/2018  Stair Climbing  Incontinent   not attempted  Communication  No difficulties Requiring repeated cues, may be due to PD  Memory  Occasional difficulties  occasional difficulties  Cooking/Meal Prep   n/a     Housework   n/a   Money Management   n/a   Driving   Pt wasn't driving     Special needs/care consideration BiPAP/CPAP no CPM no Continuous Drip IV no Dialysis no        Days  Life Vest no  Oxygen no  Special Bed no  Trach Size no Wound Vac (area) no      Location  Skin: surgical incision                           Location L groin Bowel mgmt: last BM 12/07/2018 Bladder mgmt: occasional incontinence, condom catheter in acute care Diabetic mgmt no  Previous Home Environment Living Arrangements: Spouse/significant other Available Help at Discharge: Family,  Available PRN/intermittently Type of Home: House Home Layout: Two level, Bed/bath upstairs Alternate Level Stairs-Number of Steps: flight Home Access: Stairs to enter Entrance Stairs-Rails: Right Entrance  Stairs-Number of Steps: 8-9 Bathroom Shower/Tub: Multimedia programmer: Standard Home Care Services: No Additional Comments: Pt sleeps in lift chair; fall down bottom step in garage resulting in hip fracture  Discharge Living Setting Plans for Discharge Living Setting: Patient's home Type of Home at Discharge: House Discharge Home Layout: Two level, 1/2 bath on main level Discharge Home Access: Stairs to enter Entrance Stairs-Rails: Left Entrance Stairs-Number of Steps: 8-9 Discharge Bathroom Shower/Tub: None Discharge Bathroom Toilet: Standard Discharge Bathroom Accessibility: Yes How Accessible: Accessible via walker Does the patient have any problems obtaining your medications?: No  Social/Family/Support Systems Patient Roles: Parent, Spouse Anticipated Caregiver: wife, Maggie  Anticipated Ambulance person Information: [phone] Caregiver Availability: 24/7 Discharge Plan Discussed with Primary Caregiver: Yes Is Caregiver In Agreement with Plan?: Yes Does Caregiver/Family have Issues with Lodging/Transportation while Pt is in Rehab?: No  Goals/Additional Needs Patient/Family Goal for Rehab: PT/OT supervision Expected length of stay: 14-18 days Cultural Considerations: none Dietary Needs: regular, thin Equipment Needs: tbd Pt/Family Agrees to Admission and willing to participate: Yes Program Orientation Provided & Reviewed with Pt/Caregiver Including Roles  & Responsibilities: Yes  Barriers to Discharge: Inaccessible home environment  Patient Condition: I have reviewed medical records from [facility], spoken with CSW, CM, patient, and spouse. I met with patient at the bedside for inpatient rehabilitation assessment.  Patient will benefit from ongoing PT, and OT, can actively participate in 3 hours of therapy a day 5 days of the week, and can make measurable gains during the admission.  Patient will also benefit from the coordinated team approach during  an Inpatient Acute Rehabilitation admission.  The patient will receive intensive therapy as well as Rehabilitation physician, nursing, social worker, and care management interventions.  Due to bowel management, bladder management, safety, skin/wound care, disease management, medical administration, pain management, and patient education the patient requires 24 hour a day rehabilitation nursing.  The patient is currently max +2 assist with mobility and basic ADLs.  Discharge setting and therapy post discharge at home with home health is anticipated.  Patient has agreed to participate in the Acute Inpatient Rehabilitation Program and will admit today.   Preadmission Screen Completed By:  Michel Santee, 12/11/2018 12:21 PM ______________________________________________________________________   Discussed status with Dr. Naaman Plummer on 12/11/18 at 12:21 PM and received telephone approval for admission today.  Admission Coordinator:  Michel Santee, time 12:21 PM/Date 12/11/18   Assessment/Plan: Diagnosis: left hip fx after fall, hx of PD 1. Does the need for close, 24 hr/day  Medical supervision in concert with the patient's rehab needs make it unreasonable for this patient to be served in a less intensive setting? Yes 2. Co-Morbidities requiring supervision/potential complications: pain, post-op anemia, PD 3. Due to bladder management, bowel management, safety, skin/wound care, disease management, medication administration, pain management and patient education, does the patient require 24 hr/day rehab nursing? Yes 4. Does the patient require coordinated care of a physician, rehab nurse, PT (1-2 hrs/day, 5 days/week) and OT (1-2 hrs/day, 5 days/week) to address physical and functional deficits in the context of the above medical diagnosis(es)? Yes Addressing deficits in the following areas: balance, endurance, locomotion, strength, transferring, bowel/bladder control, bathing, dressing, feeding, grooming,  toileting and psychosocial support 5. Can the patient actively participate in an intensive therapy program of at least 3 hrs of  therapy 5 days a week? Yes 6. The potential for patient to make measurable gains while on inpatient rehab is excellent 7. Anticipated functional outcomes upon discharge from inpatients are: supervision PT, supervision OT, n/a SLP 8. Estimated rehab length of stay to reach the above functional goals is: 14-18 days 9. Anticipated D/C setting: Home 10. Anticipated post D/C treatments: [place] therapy 11. Overall Rehab/Functional Prognosis: excellent  Meredith Staggers, MD, [place] [facility] 12/11/2018   Michel Santee 12/11/2018

## 2018-12-10 NOTE — Progress Notes (Signed)
Nutrition Brief Note  Received consult from the Hip Fracture Protocol.   Patient with no recent weight changes.   Body mass index is 37.88 kg/m. Patient meets criteria for obesity based on current BMI.   Current diet order is regular, patient is consuming approximately 100% of meals at this time. Labs and medications reviewed.   No nutrition interventions warranted at this time. If nutrition issues arise, please consult RD.   Molli Barrows, RD, LDN, [place] Pager [phone] After Hours Pager [phone]

## 2018-12-10 NOTE — Progress Notes (Signed)
Subjective:  1 Day Post-Op Procedure(s) (LRB): TOTAL HIP ARTHROPLASTY ANTERIOR APPROACH (Left) Patient reports pain as mild.  Feeling well.    Objective: Vital signs in last 24 hours: Temp:  [97.2 F (36.2 C)-98.2 F (36.8 C)] 98.2 F (36.8 C) (03/09 0422) Pulse Rate:  [64-82] 82 (03/08 2000) Resp:  [16-19] 18 (03/08 1118) BP: (91-147)/(41-121) 115/60 (03/09 0422) SpO2:  [91 %-96 %] 93 % (03/08 2000)  Intake/Output from previous day: 03/08 0701 - 03/09 0700 In: 3693.9 [P.O.:680; I.V.:2663.9; IV Piggyback:350] Out: 2850 [Urine:2100; Blood:750] Intake/Output this shift: Total I/O In: 1000 [P.O.:200; I.V.:750; IV Piggyback:50] Out: 700 [Urine:700]  Recent Labs    12/08/18 1707 12/09/18 1217 12/10/18 0327  HGB 12.9* 11.5* 9.8*   Recent Labs    12/09/18 1217 12/10/18 0327  WBC 14.0* 13.9*  RBC 3.73* 3.18*  HCT 34.8* 29.1*  PLT 229 202   Recent Labs    12/08/18 1707 12/09/18 1217 12/10/18 0327  NA 139  --  136  K 3.7  --  4.1  CL 108  --  106  CO2 24  --  23  BUN 17  --  15  CREATININE 1.13 1.18 1.24  GLUCOSE 93  --  139*  CALCIUM 8.6*  --  8.2*   Recent Labs    12/08/18 1707  INR 1.1    Neurovascular intact Sensation intact distally Intact pulses distally Dorsiflexion/Plantar flexion intact Incision: dressing C/D/I No cellulitis present Compartment soft   Assessment/Plan: 1 Day Post-Op Procedure(s) (LRB): TOTAL HIP ARTHROPLASTY ANTERIOR APPROACH (Left) Up with therapy  WBAT LLE ABLA- mild stable Continue plan per medicine team       [name] [name] 12/10/2018, 6:45 AM

## 2018-12-10 NOTE — Progress Notes (Signed)
OT Cancellation Note  Patient Details Name: [name] [name] MRN: [id number] DOB: 09/03/1951   Cancelled Treatment:    Reason Eval/Treat Not Completed: Other (comment). Pt just got up to recliner with PT with +2 total for bed mobility and +2 Max-Mod A sit<>stand with sara stedy. Will re-attempt eval at later time.  Golden Circle, OTR/L Acute Rehab Services Pager [phone] Office [phone]     Almon Register 12/10/2018, 12:40 PM

## 2018-12-10 NOTE — Progress Notes (Signed)
Inpatient Rehab Admissions:  Consult for IP Rehab received.  I met with patient and his wife at the bedside for rehab assessment.  At this time, they are very interested in inpatient rehab.  Noted pt with Parkinson's Disease and mild tremor in RUE.  Per wife report, pt very active at baseline and he was able to tolerate therapy and several hours in the chair today.  Will continue to follow for readiness.    Michel Santee, PT, DPT Admissions Coordinator 12/10/18 4:11 PM

## 2018-12-11 ENCOUNTER — Telehealth: Payer: Self-pay | Admitting: Neurology

## 2018-12-11 ENCOUNTER — Inpatient Hospital Stay (HOSPITAL_COMMUNITY)
Admission: RE | Admit: 2018-12-11 | Discharge: 2018-12-29 | DRG: 560 | Disposition: A | Discharge status: Home Health | Payer: Medicare Other | Source: Intra-hospital | Attending: Physical Medicine & Rehabilitation | Admitting: Physical Medicine & Rehabilitation

## 2018-12-11 ENCOUNTER — Encounter (HOSPITAL_COMMUNITY): Payer: Self-pay | Admitting: *Deleted

## 2018-12-11 ENCOUNTER — Other Ambulatory Visit: Payer: Self-pay

## 2018-12-11 DIAGNOSIS — S72002A Fracture of unspecified part of neck of left femur, initial encounter for closed fracture: Secondary | ICD-10-CM | POA: Diagnosis not present

## 2018-12-11 DIAGNOSIS — R400 Somnolence: Secondary | ICD-10-CM | POA: Diagnosis not present

## 2018-12-11 DIAGNOSIS — R1312 Dysphagia, oropharyngeal phase: Secondary | ICD-10-CM | POA: Diagnosis not present

## 2018-12-11 DIAGNOSIS — D72829 Elevated white blood cell count, unspecified: Secondary | ICD-10-CM

## 2018-12-11 DIAGNOSIS — Z801 Family history of malignant neoplasm of trachea, bronchus and lung: Secondary | ICD-10-CM

## 2018-12-11 DIAGNOSIS — E46 Unspecified protein-calorie malnutrition: Secondary | ICD-10-CM | POA: Diagnosis present

## 2018-12-11 DIAGNOSIS — Z813 Family history of other psychoactive substance abuse and dependence: Secondary | ICD-10-CM

## 2018-12-11 DIAGNOSIS — Z79899 Other long term (current) drug therapy: Secondary | ICD-10-CM

## 2018-12-11 DIAGNOSIS — L218 Other seborrheic dermatitis: Secondary | ICD-10-CM | POA: Diagnosis present

## 2018-12-11 DIAGNOSIS — F419 Anxiety disorder, unspecified: Secondary | ICD-10-CM | POA: Diagnosis present

## 2018-12-11 DIAGNOSIS — Z96642 Presence of left artificial hip joint: Secondary | ICD-10-CM | POA: Diagnosis present

## 2018-12-11 DIAGNOSIS — R278 Other lack of coordination: Secondary | ICD-10-CM | POA: Diagnosis present

## 2018-12-11 DIAGNOSIS — G8918 Other acute postprocedural pain: Secondary | ICD-10-CM

## 2018-12-11 DIAGNOSIS — S72002D Fracture of unspecified part of neck of left femur, subsequent encounter for closed fracture with routine healing: Secondary | ICD-10-CM | POA: Diagnosis not present

## 2018-12-11 DIAGNOSIS — E8809 Other disorders of plasma-protein metabolism, not elsewhere classified: Secondary | ICD-10-CM | POA: Diagnosis present

## 2018-12-11 DIAGNOSIS — E441 Mild protein-calorie malnutrition: Secondary | ICD-10-CM | POA: Diagnosis present

## 2018-12-11 DIAGNOSIS — F339 Major depressive disorder, recurrent, unspecified: Secondary | ICD-10-CM | POA: Diagnosis not present

## 2018-12-11 DIAGNOSIS — R410 Disorientation, unspecified: Secondary | ICD-10-CM | POA: Diagnosis not present

## 2018-12-11 DIAGNOSIS — E669 Obesity, unspecified: Secondary | ICD-10-CM | POA: Diagnosis present

## 2018-12-11 DIAGNOSIS — N3281 Overactive bladder: Secondary | ICD-10-CM | POA: Diagnosis present

## 2018-12-11 DIAGNOSIS — F331 Major depressive disorder, recurrent, moderate: Secondary | ICD-10-CM | POA: Diagnosis not present

## 2018-12-11 DIAGNOSIS — G20 Parkinson's disease: Secondary | ICD-10-CM | POA: Diagnosis present

## 2018-12-11 DIAGNOSIS — Z811 Family history of alcohol abuse and dependence: Secondary | ICD-10-CM

## 2018-12-11 DIAGNOSIS — R1310 Dysphagia, unspecified: Secondary | ICD-10-CM | POA: Diagnosis not present

## 2018-12-11 DIAGNOSIS — D62 Acute posthemorrhagic anemia: Secondary | ICD-10-CM | POA: Diagnosis not present

## 2018-12-11 DIAGNOSIS — K5901 Slow transit constipation: Secondary | ICD-10-CM

## 2018-12-11 DIAGNOSIS — R9431 Abnormal electrocardiogram [ECG] [EKG]: Secondary | ICD-10-CM | POA: Diagnosis present

## 2018-12-11 DIAGNOSIS — G20A1 Parkinson's disease without dyskinesia, without mention of fluctuations: Secondary | ICD-10-CM

## 2018-12-11 DIAGNOSIS — G4733 Obstructive sleep apnea (adult) (pediatric): Secondary | ICD-10-CM | POA: Diagnosis present

## 2018-12-11 DIAGNOSIS — R739 Hyperglycemia, unspecified: Secondary | ICD-10-CM | POA: Diagnosis present

## 2018-12-11 DIAGNOSIS — R4182 Altered mental status, unspecified: Secondary | ICD-10-CM

## 2018-12-11 DIAGNOSIS — F332 Major depressive disorder, recurrent severe without psychotic features: Secondary | ICD-10-CM | POA: Diagnosis not present

## 2018-12-11 DIAGNOSIS — G218 Other secondary parkinsonism: Secondary | ICD-10-CM | POA: Diagnosis not present

## 2018-12-11 DIAGNOSIS — Z6839 Body mass index (BMI) 39.0-39.9, adult: Secondary | ICD-10-CM

## 2018-12-11 DIAGNOSIS — J984 Other disorders of lung: Secondary | ICD-10-CM | POA: Diagnosis not present

## 2018-12-11 DIAGNOSIS — R0989 Other specified symptoms and signs involving the circulatory and respiratory systems: Secondary | ICD-10-CM

## 2018-12-11 DIAGNOSIS — K5904 Chronic idiopathic constipation: Secondary | ICD-10-CM | POA: Diagnosis present

## 2018-12-11 DIAGNOSIS — R471 Dysarthria and anarthria: Secondary | ICD-10-CM | POA: Diagnosis present

## 2018-12-11 DIAGNOSIS — M7989 Other specified soft tissue disorders: Secondary | ICD-10-CM | POA: Diagnosis not present

## 2018-12-11 DIAGNOSIS — W108XXD Fall (on) (from) other stairs and steps, subsequent encounter: Secondary | ICD-10-CM | POA: Diagnosis present

## 2018-12-11 LAB — BASIC METABOLIC PANEL
Anion gap: 9 (ref 5–15)
BUN: 19 mg/dL (ref 8–23)
CHLORIDE: 105 mmol/L (ref 98–111)
CO2: 23 mmol/L (ref 22–32)
CREATININE: 1.1 mg/dL (ref 0.61–1.24)
Calcium: 8.5 mg/dL — ABNORMAL LOW (ref 8.9–10.3)
GFR calc Af Amer: >60 mL/min (ref >60)
GFR calc non Af Amer: >60 mL/min (ref >60)
Glucose, Bld: 129 mg/dL — ABNORMAL HIGH (ref 70–99)
Potassium: 3.8 mmol/L (ref 3.5–5.1)
Sodium: 137 mmol/L (ref 135–145)

## 2018-12-11 LAB — CBC
HCT: 27.7 % — ABNORMAL LOW (ref 39.0–52.0)
Hemoglobin: 9.3 g/dL — ABNORMAL LOW (ref 13.0–17.0)
MCH: 30.4 pg (ref 26.0–34.0)
MCHC: 33.6 g/dL (ref 30.0–36.0)
MCV: 90.5 fL (ref 80.0–100.0)
Platelets: 181 10*3/uL (ref 150–400)
RBC: 3.06 MIL/uL — ABNORMAL LOW (ref 4.22–5.81)
RDW: 13 % (ref 11.5–15.5)
WBC: 10.5 10*3/uL (ref 4.0–10.5)
nRBC: 0 % (ref 0.0–0.2)

## 2018-12-11 MED ORDER — ENOXAPARIN SODIUM 40 MG/0.4ML ~~LOC~~ SOLN
40.0000 mg | SUBCUTANEOUS | Status: DC
  Administered 2018-12-12 – 2018-12-29 (×17): 40 mg via SUBCUTANEOUS
  Filled 2018-12-11 (×19): qty 0.4

## 2018-12-11 MED ORDER — METHOCARBAMOL 1000 MG/10ML IJ SOLN
500.0000 mg | Freq: Four times a day (QID) | INTRAVENOUS | Status: DC | PRN
  Filled 2018-12-11: qty 5

## 2018-12-11 MED ORDER — POLYETHYLENE GLYCOL 3350 17 G PO PACK: 17.0000 g | PACK | Freq: Every day | ORAL | 0 refills | Status: DC | PRN

## 2018-12-11 MED ORDER — ONDANSETRON HCL 4 MG/2ML IJ SOLN: 4.0000 mg | Freq: Four times a day (QID) | INTRAMUSCULAR | Status: DC | PRN

## 2018-12-11 MED ORDER — BUPROPION HCL ER (XL) 150 MG PO TB24: 150.0000 mg | ORAL_TABLET | Freq: Every day | ORAL | Status: DC

## 2018-12-11 MED ORDER — CARBIDOPA-LEVODOPA 25-100 MG PO TABS: 1.0000 | ORAL_TABLET | Freq: Three times a day (TID) | ORAL | Status: DC

## 2018-12-11 MED ORDER — ACETAMINOPHEN 325 MG PO TABS
325.0000 mg | ORAL_TABLET | Freq: Four times a day (QID) | ORAL | Status: DC | PRN
  Administered 2018-12-13: 650 mg via ORAL
  Filled 2018-12-11: qty 2

## 2018-12-11 MED ORDER — ENTACAPONE 200 MG PO TABS
200.0000 mg | ORAL_TABLET | Freq: Three times a day (TID) | ORAL | Status: DC
  Administered 2018-12-11 – 2018-12-29 (×53): 200 mg via ORAL
  Filled 2018-12-11 (×56): qty 1

## 2018-12-11 MED ORDER — FESOTERODINE FUMARATE ER 4 MG PO TB24: 4.0000 mg | ORAL_TABLET | Freq: Every day | ORAL | Status: DC

## 2018-12-11 MED ORDER — BUPROPION HCL ER (XL) 150 MG PO TB24
300.0000 mg | ORAL_TABLET | Freq: Every day | ORAL | Status: DC
  Administered 2018-12-12 – 2018-12-29 (×17): 300 mg via ORAL
  Filled 2018-12-11 (×19): qty 2

## 2018-12-11 MED ORDER — BUPROPION HCL ER (XL) 150 MG PO TB24
150.0000 mg | ORAL_TABLET | Freq: Every day | ORAL | Status: DC
  Administered 2018-12-11 – 2018-12-28 (×18): 150 mg via ORAL
  Filled 2018-12-11 (×18): qty 1

## 2018-12-11 MED ORDER — LINACLOTIDE 145 MCG PO CAPS
290.0000 ug | ORAL_CAPSULE | Freq: Every day | ORAL | Status: DC
  Administered 2018-12-12 – 2018-12-17 (×6): 290 ug via ORAL
  Filled 2018-12-11 (×7): qty 2

## 2018-12-11 MED ORDER — ROPINIROLE HCL 1 MG PO TABS
1.0000 mg | ORAL_TABLET | Freq: Three times a day (TID) | ORAL | Status: DC
  Administered 2018-12-11 – 2018-12-29 (×53): 1 mg via ORAL
  Filled 2018-12-11 (×55): qty 1

## 2018-12-11 MED ORDER — METHOCARBAMOL 500 MG PO TABS
500.0000 mg | ORAL_TABLET | Freq: Four times a day (QID) | ORAL | Status: DC | PRN
  Administered 2018-12-14: 500 mg via ORAL
  Filled 2018-12-11: qty 1

## 2018-12-11 MED ORDER — ONDANSETRON HCL 4 MG PO TABS: 4.0000 mg | ORAL_TABLET | Freq: Four times a day (QID) | ORAL | Status: DC | PRN

## 2018-12-11 MED ORDER — FESOTERODINE FUMARATE ER 4 MG PO TB24
4.0000 mg | ORAL_TABLET | Freq: Every day | ORAL | Status: DC
  Administered 2018-12-11 – 2018-12-28 (×18): 4 mg via ORAL
  Filled 2018-12-11 (×18): qty 1

## 2018-12-11 MED ORDER — ENOXAPARIN SODIUM 40 MG/0.4ML ~~LOC~~ SOLN: 40.0000 mg | SUBCUTANEOUS | Status: DC

## 2018-12-11 MED ORDER — BUPROPION HCL ER (XL) 150 MG PO TB24: 300.0000 mg | ORAL_TABLET | Freq: Every day | ORAL | Status: DC

## 2018-12-11 MED ORDER — DOCUSATE SODIUM 100 MG PO CAPS
100.0000 mg | ORAL_CAPSULE | Freq: Two times a day (BID) | ORAL | Status: DC
  Administered 2018-12-13 – 2018-12-15 (×2): 100 mg via ORAL
  Filled 2018-12-11 (×4): qty 1

## 2018-12-11 MED ORDER — CARBIDOPA-LEVODOPA 25-100 MG PO TABS
2.0000 | ORAL_TABLET | Freq: Two times a day (BID) | ORAL | Status: DC
  Administered 2018-12-12 – 2018-12-29 (×34): 2 via ORAL
  Filled 2018-12-11 (×35): qty 2

## 2018-12-11 MED ORDER — METHOCARBAMOL 500 MG PO TABS: 500.0000 mg | ORAL_TABLET | Freq: Four times a day (QID) | ORAL | Status: DC | PRN

## 2018-12-11 MED ORDER — SORBITOL 70 % SOLN
960.0000 mL | TOPICAL_OIL | Freq: Once | ORAL | Status: AC
  Administered 2018-12-11: 960 mL via RECTAL
  Filled 2018-12-11: qty 473

## 2018-12-11 MED ORDER — OXYCODONE HCL 5 MG PO TABS
5.0000 mg | ORAL_TABLET | ORAL | Status: DC | PRN
  Administered 2018-12-12 – 2018-12-13 (×2): 5 mg via ORAL
  Filled 2018-12-11: qty 2
  Filled 2018-12-11: qty 1

## 2018-12-11 MED ORDER — CARBIDOPA-LEVODOPA 25-100 MG PO TABS
1.0000 | ORAL_TABLET | Freq: Every day | ORAL | Status: DC
  Administered 2018-12-11 – 2018-12-28 (×18): 1 via ORAL
  Filled 2018-12-11 (×19): qty 1

## 2018-12-11 MED ORDER — SORBITOL 70 % SOLN: 30.0000 mL | Freq: Every day | Status: DC | PRN

## 2018-12-11 MED ORDER — POLYETHYLENE GLYCOL 3350 17 G PO PACK
17.0000 g | PACK | Freq: Every day | ORAL | Status: DC | PRN
  Administered 2018-12-27: 17 g via ORAL
  Filled 2018-12-11: qty 1

## 2018-12-11 MED ORDER — CITALOPRAM HYDROBROMIDE 10 MG PO TABS
40.0000 mg | ORAL_TABLET | Freq: Every day | ORAL | Status: DC
  Administered 2018-12-11: 40 mg via ORAL
  Filled 2018-12-11: qty 4

## 2018-12-11 NOTE — Telephone Encounter (Signed)
Tried to call patient's cell number with no answer to let them know this recommendation. If they call back the number to Compliance is [phone] or they can speak to Nurse Management.

## 2018-12-11 NOTE — Progress Notes (Signed)
Subjective:  2 Days Post-Op Procedure(s) (LRB): TOTAL HIP ARTHROPLASTY ANTERIOR APPROACH (Left) Patient reports pain as mild.   Objective: Vital signs in last 24 hours: Temp:  [97.4 F (36.3 C)-98.2 F (36.8 C)] 98.2 F (36.8 C) (03/10 0345) Pulse Rate:  [82-88] 82 (03/10 0345) Resp:  [17-18] 18 (03/10 0345) BP: (118-141)/(74-85) 123/74 (03/10 0345) SpO2:  [91 %-93 %] 93 % (03/10 0345)  Intake/Output from previous day: 03/09 0701 - 03/10 0700 In: 720 [P.O.:720] Out: 900 [Urine:900] Intake/Output this shift: No intake/output data recorded.  Recent Labs    12/08/18 1707 12/09/18 1217 12/10/18 0327 12/11/18 0313  HGB 12.9* 11.5* 9.8* 9.3*   Recent Labs    12/10/18 0327 12/11/18 0313  WBC 13.9* 10.5  RBC 3.18* 3.06*  HCT 29.1* 27.7*  PLT 202 181   Recent Labs    12/10/18 0327 12/11/18 0313  NA 136 137  K 4.1 3.8  CL 106 105  CO2 23 23  BUN 15 19  CREATININE 1.24 1.10  GLUCOSE 139* 129*  CALCIUM 8.2* 8.5*   Recent Labs    12/08/18 1707  INR 1.1    Neurovascular intact Sensation intact distally Intact pulses distally Dorsiflexion/Plantar flexion intact Incision: dressing C/D/I No cellulitis present Compartment soft   Assessment/Plan: 2 Days Post-Op Procedure(s) (LRB): TOTAL HIP ARTHROPLASTY ANTERIOR APPROACH (Left) Up with therapy  WBAT LLE ABLA- mild and stable D/c dispo per medicine team (looks like CIR) F/u with Dr. Erlinda Hong 2 weeks post-op      [name] [name] 12/11/2018, 7:43 AM

## 2018-12-11 NOTE — Progress Notes (Signed)
[name] Staggers, MD  Physician  Physical Medicine and Rehabilitation  PMR Pre-admission  Signed  Date of Service:  12/10/2018 4:18 PM       Related encounter: ED to Hosp-Admission (Discharged) from 12/08/2018 in [place]         Show:Clear all '[x]'$ Manual'[x]'$ Template'[x]'$ Copied  Added by: '[x]'$ [name] Staggers, MD'[x]'$ Michel Santee, PT  '[]'$ Hover for details PMR Admission Coordinator Pre-Admission Assessment  Patient: [name] [name] is an 67 y.o., male MRN: [id number] DOB: 01/25/1951 Height: '6\' 2"'$  (188 cm) Weight: 133.8 kg  Insurance Information HMO:     PPO:      PCP:      IPA:      80/20:      OTHER:  PRIMARY: Medicare A and B      Policy#: [id number]      Subscriber: patient CM Name:       Phone#:      Fax#:  Pre-Cert#:    Employer:  Benefits:  Phone #:  [phone]    Name: [employer] 12/10/2018 Eff. Date: 12/01/2017     Deduct: $1408      Out of Pocket Max: none      Life Max: n/a CIR: 100%      SNF: 20 full days Outpatient: 80%     Co-Pay: 20%      Home Health: 100%      Co-Pay:  DME: 80%     Co-Pay: 20% Providers: patient's choice  SECONDARY: [employer]      Policy#: [id number]      Subscriber: patient CM Name:       Phone#:      Fax#:  Pre-Cert#:       Employer:  Benefits:  Phone #:  [phone]    Name:  Eff. Date:      Deduct:       Out of Pocket Max:       Life Max:  CIR:       SNF:  Outpatient:      Co-Pay:  Home Health:       Co-Pay:  DME:      Co-Pay:   Medicaid Application Date:       Case Manager:  Disability Application Date:       Case Worker:   Emergency Contact Information         Contact Information    Name Relation Home Work Mobile   [name],[name] Other [phone]  [phone]   No name specified          Current Medical History  Patient Admitting Diagnosis: L hip fracture History of Present Illness: [name] [name] is a  68 year old right-handed male with history of Parkinson's disease followed by neurology services Dr. [name] [name] maintained on Sinemet 25-100 milligrams 2 tabs in the morning 2 tabs in the afternoon and 1 tablet in the evening ,Comtan 200 mg 3 times a day, depression maintained on Wellbutrin 150 mg every morning and 300 mg every evening Celexa 40 mg daily, chronic idiopathic constipation. Presented 12/08/2018 after a fall while walking down into his garage. He states he missed the last step. He did not hit his head or black out. Patient noted significant left hip pain. X-rays and imaging revealed left femoral neck fracture. Underwent total hip replacement uncemented 12/09/2018 per Dr.Xu with anterior approach. Hospital course pain management and weightbearing as tolerated left lower extremity. Acute blood loss  anemia 9.3. subcutaneous Lovenox for DVT prophylaxis. Therapy evaluations completed with recommendations of physical medicine rehabilitation consult.   Patient's medical record from [facility] has been reviewed by the rehabilitation admission coordinator and physician.   Past Medical History      Past Medical History:  Diagnosis Date  .  OSA (obstructive sleep apnea)    npsg 2012:  AHI 67/hr. Auto titration 2012:  Optimal pressure 12cm.   . APPENDECTOMY, HX OF 02/18/2008   Qualifier: Diagnosis of  By: [place], [place]    . Cardiac murmur    as a child  . Chronic idiopathic constipation   . Depression   . Headache(784.0)   . HEADACHES, HX OF 02/18/2008   Qualifier: Diagnosis of  By: Danny Lawless CMA, [place]    . Parkinson disease ([place]) 11/2014  . Sinus mucosal thickening    pt unable to lay flat  . Streptococcal meningitis    as an infant    Family History   family history includes Alcohol abuse in his brother; Drug abuse in his brother; Lung cancer in his father; Seizures in his daughter.  Prior Rehab/Hospitalizations Has the patient had major surgery  during 100 days prior to admission? Yes, total hip arthroplasty (L) on 12/09/2018     Current Medications  Current Facility-Administered Medications:  .  acetaminophen (TYLENOL) tablet 325-650 mg, 325-650 mg, Oral, Q6H PRN, Leandrew Koyanagi, MD .  alum & mag hydroxide-simeth (MAALOX/MYLANTA) 200-200-20 MG/5ML suspension 30 mL, 30 mL, Oral, Q4H PRN, Leandrew Koyanagi, MD, 30 mL at 12/10/18 2154 .  buPROPion (WELLBUTRIN XL) 24 hr tablet 300 mg, 300 mg, Oral, Daily, 300 mg at 12/11/18 0637 **AND** buPROPion (WELLBUTRIN XL) 24 hr tablet 150 mg, 150 mg, Oral, Q1500, Cristal Deer, MD, 150 mg at 12/10/18 1932 .  carbidopa-levodopa (SINEMET IR) 25-100 MG per tablet immediate release 1-2 tablet, 1-2 tablet, Oral, TID, Leandrew Koyanagi, MD, 1 tablet at 12/11/18 1154 .  citalopram (CELEXA) tablet 40 mg, 40 mg, Oral, Daily, Leandrew Koyanagi, MD, 40 mg at 12/10/18 1932 .  docusate sodium (COLACE) capsule 100 mg, 100 mg, Oral, BID, Leandrew Koyanagi, MD, 100 mg at 12/11/18 0630 .  enoxaparin (LOVENOX) injection 40 mg, 40 mg, Subcutaneous, Q24H, Leandrew Koyanagi, MD, 40 mg at 12/11/18 1601 .  entacapone (COMTAN) tablet 200 mg, 200 mg, Oral, TID, Leandrew Koyanagi, MD, 200 mg at 12/11/18 0932 .  fesoterodine (TOVIAZ) tablet 4 mg, 4 mg, Oral, Daily, Leandrew Koyanagi, MD, 4 mg at 12/10/18 1816 .  HYDROmorphone (DILAUDID) injection 0.5-1 mg, 0.5-1 mg, Intravenous, Q4H PRN, Leandrew Koyanagi, MD .  linaclotide Rolan Lipa) capsule 290 mcg, 290 mcg, Oral, QAC breakfast, Leandrew Koyanagi, MD, 290 mcg at 12/11/18 [phone] .  magnesium citrate solution 1 Bottle, 1 Bottle, Oral, Once PRN, Leandrew Koyanagi, MD .  menthol-cetylpyridinium (CEPACOL) lozenge 3 mg, 1 lozenge, Oral, PRN **OR** phenol (CHLORASEPTIC) mouth spray 1 spray, 1 spray, Mouth/Throat, PRN, Leandrew Koyanagi, MD .  methocarbamol (ROBAXIN) tablet 500 mg, 500 mg, Oral, Q6H PRN, 500 mg at 12/11/18 0636 **OR** methocarbamol (ROBAXIN) 500 mg in dextrose 5 % 50 mL IVPB, 500 mg, Intravenous, Q6H PRN, Leandrew Koyanagi, MD .  methocarbamol (ROBAXIN) tablet 750 mg, 750 mg, Oral, Q8H PRN, Leandrew Koyanagi, MD, 750 mg at 12/11/18 1154 .  morphine 2 MG/ML injection 0.5 mg, 0.5 mg, Intravenous, Q2H PRN, Leandrew Koyanagi, MD, 0.5 mg at 12/09/18 0616 .  ondansetron (  ZOFRAN) tablet 4 mg, 4 mg, Oral, Q6H PRN **OR** ondansetron (ZOFRAN) injection 4 mg, 4 mg, Intravenous, Q6H PRN, Leandrew Koyanagi, MD .  oxyCODONE (Oxy IR/ROXICODONE) immediate release tablet 5-10 mg, 5-10 mg, Oral, Q4H PRN, Domenic Polite, MD, 10 mg at 12/11/18 0636 .  polyethylene glycol (MIRALAX / GLYCOLAX) packet 17 g, 17 g, Oral, Daily PRN, Leandrew Koyanagi, MD .  rOPINIRole (REQUIP) tablet 1 mg, 1 mg, Oral, TID, Leandrew Koyanagi, MD, 1 mg at 12/11/18 1154 .  sorbitol 70 % solution 30 mL, 30 mL, Oral, Daily PRN, Leandrew Koyanagi, MD .  tranexamic acid (CYKLOKAPRON) 2,000 mg in sodium chloride 0.9 % 50 mL Topical Application, 1,696 mg, Topical, Once, Leandrew Koyanagi, MD  Patients Current Diet:   Diet Order                  Diet regular Room service appropriate? Yes; Fluid consistency: Thin  Diet effective now               Precautions / Restrictions Precautions Precautions: Fall Restrictions Weight Bearing Restrictions: Yes LLE Weight Bearing: Weight bearing as tolerated   Has the patient had 2 or more falls or a fall with injury in the past year?Yes, pt endorses at least 2 falls this year, including the one that led to this admission  Prior Activity Level Community (5-7x/wk): Was very active, going to the gym several times/week, not driving 2/2 parkinson's  Prior Functional Level Do you want Prior Function Level of Independence: Needs assistance Gait / Transfers Assistance Needed: Mod I, uses a SPC occasionally ADL's / Homemaking Assistance Needed: Pt's wife assists with some ADLs Communication / Swallowing Assistance Needed: Independent from other? Self Care: Did the patient need help bathing, dressing, using the toilet or  eating?  Needed some help  Indoor Mobility: Did the patient need assistance with walking from room to room (with or without device)? Independent  Stairs: Did the patient need assistance with internal or external stairs (with or without device)? Independent  Functional Cognition: Did the patient need help planning regular tasks such as shopping or remembering to take medications? Needed some help  Home Assistive Devices / [place] Devices/Equipment: Cane (specify quad or straight) Home Equipment: Cane - quad, Cane - single point  Prior Device Use: Indicate devices/aids used by the patient prior to current illness, exacerbation or injury? occasionally used SPC  Prior Functional Level   Prior Functional Level Current Functional Level  Bed Mobility  Independent Total Assistance  Transfers  Independent Max assistance +2  Mobility - Walk/Wheelchair  Independent   Mobility - Ambulation/Gait  Independent 5 feet, Total assist, +2 physical assistance, +2 safety/equipment  Upper Body Dressing  Independent   Lower Body Dressing  Needed occasional assist Total assistance  Grooming  Independent Moderate assistance, Sitting  Eating/Drinking  Independent Moderate assistance  Toilet Transfer  Independent +2 for physical assistance, Maximal assistance  Bladder Continence  Occasional incontinence over night  occasional incontinence  Bowel Management   Continent   continent, last BM 12/07/2018  Stair Climbing  Incontinent   not attempted  Communication  No difficulties Requiring repeated cues, may be due to PD  Memory  Occasional difficulties  occasional difficulties  Cooking/Meal Prep   n/a     Housework   n/a   Money Management   n/a   Driving   Pt wasn't driving     Special needs/care consideration BiPAP/CPAP no CPM no Continuous Drip IV  no Dialysis no        Days  Life Vest no  Oxygen no  Special Bed no  Trach Size no Wound Vac (area) no       Location  Skin: surgical incision                           Location L groin Bowel mgmt: last BM 12/07/2018 Bladder mgmt: occasional incontinence, condom catheter in acute care Diabetic mgmt no  Previous Home Environment Living Arrangements: Spouse/significant other Available Help at Discharge: Family, Available PRN/intermittently Type of Home: House Home Layout: Two level, Bed/bath upstairs Alternate Level Stairs-Number of Steps: flight Home Access: Stairs to enter Entrance Stairs-Rails: Right Entrance Stairs-Number of Steps: 8-9 Bathroom Shower/Tub: Multimedia programmer: Standard Home Care Services: No Additional Comments: Pt sleeps in lift chair; fall down bottom step in garage resulting in hip fracture  Discharge Living Setting Plans for Discharge Living Setting: Patient's home Type of Home at Discharge: House Discharge Home Layout: Two level, 1/2 bath on main level Discharge Home Access: Stairs to enter Entrance Stairs-Rails: Left Entrance Stairs-Number of Steps: 8-9 Discharge Bathroom Shower/Tub: None Discharge Bathroom Toilet: Standard Discharge Bathroom Accessibility: Yes How Accessible: Accessible via walker Does the patient have any problems obtaining your medications?: No  Social/Family/Support Systems Patient Roles: Parent, Spouse Anticipated Caregiver: wife, Maggie  Anticipated Ambulance person Information: [phone] Caregiver Availability: 24/7 Discharge Plan Discussed with Primary Caregiver: Yes Is Caregiver In Agreement with Plan?: Yes Does Caregiver/Family have Issues with Lodging/Transportation while Pt is in Rehab?: No  Goals/Additional Needs Patient/Family Goal for Rehab: PT/OT supervision Expected length of stay: 14-18 days Cultural Considerations: none Dietary Needs: regular, thin Equipment Needs: tbd Pt/Family Agrees to Admission and willing to participate: Yes Program Orientation Provided & Reviewed with Pt/Caregiver Including  Roles  & Responsibilities: Yes  Barriers to Discharge: Inaccessible home environment  Patient Condition: I have reviewed medical records from [facility], spoken with CSW, CM, patient, and spouse. I met with patient at the bedside for inpatient rehabilitation assessment.  Patient will benefit from ongoing PT, and OT, can actively participate in 3 hours of therapy a day 5 days of the week, and can make measurable gains during the admission.  Patient will also benefit from the coordinated team approach during an Inpatient Acute Rehabilitation admission.  The patient will receive intensive therapy as well as Rehabilitation physician, nursing, social worker, and care management interventions.  Due to bowel management, bladder management, safety, skin/wound care, disease management, medical administration, pain management, and patient education the patient requires 24 hour a day rehabilitation nursing.  The patient is currently max +2 assist with mobility and basic ADLs.  Discharge setting and therapy post discharge at home with home health is anticipated.  Patient has agreed to participate in the Acute Inpatient Rehabilitation Program and will admit today.   Preadmission Screen Completed By:  Michel Santee, 12/11/2018 12:21 PM ______________________________________________________________________   Discussed status with Dr. Naaman Plummer on 12/11/18 at 12:21 PM and received telephone approval for admission today.  Admission Coordinator:  Michel Santee, time 12:21 PM/Date 12/11/18   Assessment/Plan: Diagnosis: left hip fx after fall, hx of PD 1. Does the need for close, 24 hr/day  Medical supervision in concert with the patient's rehab needs make it unreasonable for this patient to be served in a less intensive setting? Yes 2. Co-Morbidities requiring supervision/potential complications: pain, post-op anemia, PD 3. Due to bladder management, bowel  management, safety, skin/wound care, disease  management, medication administration, pain management and patient education, does the patient require 24 hr/day rehab nursing? Yes 4. Does the patient require coordinated care of a physician, rehab nurse, PT (1-2 hrs/day, 5 days/week) and OT (1-2 hrs/day, 5 days/week) to address physical and functional deficits in the context of the above medical diagnosis(es)? Yes Addressing deficits in the following areas: balance, endurance, locomotion, strength, transferring, bowel/bladder control, bathing, dressing, feeding, grooming, toileting and psychosocial support 5. Can the patient actively participate in an intensive therapy program of at least 3 hrs of therapy 5 days a week? Yes 6. The potential for patient to make measurable gains while on inpatient rehab is excellent 7. Anticipated functional outcomes upon discharge from inpatients are: supervision PT, supervision OT, n/a SLP 8. Estimated rehab length of stay to reach the above functional goals is: 14-18 days 9. Anticipated D/C setting: Home 10. Anticipated post D/C treatments: Ball therapy 11. Overall Rehab/Functional Prognosis: excellent  [name] Staggers, MD, [facility] 12/11/2018   Michel Santee 12/11/2018        Revision History

## 2018-12-11 NOTE — Plan of Care (Signed)
  Problem: RH BLADDER ELIMINATION Goal: RH STG MANAGE BLADDER WITH ASSISTANCE Description STG Manage Bladder With min Assistance  Outcome: Progressing   Problem: RH SKIN INTEGRITY Goal: RH STG SKIN FREE OF INFECTION/BREAKDOWN Outcome: Progressing Goal: RH STG MAINTAIN SKIN INTEGRITY WITH ASSISTANCE Description STG Maintain Skin Integrity With min Assistance.  Outcome: Progressing   Problem: RH SAFETY Goal: RH STG ADHERE TO SAFETY PRECAUTIONS W/ASSISTANCE/DEVICE Description STG Adhere to Safety Precautions With min Assistance/Device.  Outcome: Progressing   Problem: RH PAIN MANAGEMENT Goal: RH STG PAIN MANAGED AT OR BELOW PT'S PAIN GOAL Description Less than 3 out of 10  Outcome: Progressing   Problem: RH KNOWLEDGE DEFICIT GENERAL Goal: RH STG INCREASE KNOWLEDGE OF SELF CARE AFTER HOSPITALIZATION Outcome: Progressing   Problem: Consults Goal: RH GENERAL PATIENT EDUCATION Description See Patient Education module for education specifics. Outcome: Progressing   Problem: RH BOWEL ELIMINATION Goal: RH STG MANAGE BOWEL WITH ASSISTANCE Description STG Manage Bowel with min Assistance.  Outcome: Not Progressing

## 2018-12-11 NOTE — Evaluation (Signed)
Occupational Therapy Evaluation Patient Details Name: [name] [name] MRN: [id number] DOB: 04/05/1951 Today's Date: 12/11/2018    History of Present Illness 68 yo male admitted secondary to fall at home, with L hip fracture. He is now s/p L direct anterior THA with an anterior approach. PMH significant for Parkinson's Disease, depression.   Clinical Impression   Patient is s/p L THA direct anterior surgery resulting in functional limitations due to the deficits listed below (see OT problem list). Pt currently total +2 max(A) from chair with RW. Pt demonstrates increased activation and sequence with sara stedy. Pt with cognitive deficits noted this session by wife.  Patient will benefit from skilled OT acutely to increase independence and safety with ADLS to allow discharge CIR.     Follow Up Recommendations  CIR    Equipment Recommendations  Other (comment)(TBA-- progressing with therapy)    Recommendations for Other Services Rehab consult     Precautions / Restrictions Precautions Precautions: Fall Restrictions Weight Bearing Restrictions: Yes LLE Weight Bearing: Weight bearing as tolerated      Mobility Bed Mobility               General bed mobility comments: in chair on arrival  Transfers Overall transfer level: Needs assistance Equipment used: Rolling walker (2 wheeled) Transfers: Sit to/from Stand Sit to Stand: +2 physical assistance;Max assist         General transfer comment: pt completed sit<>stand from chair with total+2 Max A with counting 1, 2 .3 to help with sequence and initiation. Pt needed total +3 once in standing to help anterior weight shift. pt able to progress to total +2 min (A) with just static standing.     Balance Overall balance assessment: Needs assistance Sitting-balance support: Bilateral upper extremity supported;Feet supported Sitting balance-Leahy Scale: Poor     Standing balance support: Bilateral upper extremity  supported;During functional activity Standing balance-Leahy Scale: Poor Standing balance comment: posterior lean and needs max cues for anterior weight shift                           ADL either performed or assessed with clinical judgement   ADL Overall ADL's : Needs assistance/impaired Eating/Feeding: Moderate assistance Eating/Feeding Details (indicate cue type and reason): tremor noted in hand Grooming: Moderate assistance;Sitting       Lower Body Bathing: Total assistance       Lower Body Dressing: Total assistance   Toilet Transfer: +2 for physical assistance;Maximal assistance   Toileting- Clothing Manipulation and Hygiene: +2 for safety/equipment;+2 for physical assistance;Total assistance         General ADL Comments: pt benefits from sara stedy at this time as the patient is more confident using this piece of equipment and more automatic with sequence for sit<>Stand     Vision Baseline Vision/History: Wears glasses Wears Glasses: At all times       Perception     Praxis      Pertinent Vitals/Pain Pain Assessment: Faces Faces Pain Scale: Hurts whole lot Pain Location: L hip Pain Descriptors / Indicators: Aching;Operative site guarding;Grimacing;Sore Pain Intervention(s): Monitored during session;Premedicated before session;Repositioned     Hand Dominance Right   Extremity/Trunk Assessment Upper Extremity Assessment Upper Extremity Assessment: Generalized weakness       Cervical / Trunk Assessment Cervical / Trunk Assessment: Kyphotic   Communication Communication Communication: Other (comment)(requires repeated, clear cuing)   Cognition Arousal/Alertness: Awake/alert Behavior During Therapy: WFL for tasks assessed/performed;Anxious Overall Cognitive  Status: History of cognitive impairments - at baseline                                 General Comments: Parkinson, asking if therapist has eaten dinner just after  finishing breakfast. pt fearful of fall ing and very anxious with weight shifts   General Comments  IV out on arrival . gauze placed and RN notifed IV is d/c     Exercises     Shoulder Instructions      Home Living Family/patient expects to be discharged to:: Private residence Living Arrangements: Spouse/significant other Available Help at Discharge: Family;Available PRN/intermittently Type of Home: House Home Access: Stairs to enter [employer] of Steps: 8-9 Entrance Stairs-Rails: Right Home Layout: Two level;Bed/bath upstairs Alternate Level Stairs-Number of Steps: flight   Bathroom Shower/Tub: Occupational psychologist: Standard     Home Equipment: Cane - quad;Cane - single point   Additional Comments: Pt sleeps in lift chair; fall down bottom step in garage resulting in hip fracture      Prior Functioning/Environment Level of Independence: Needs assistance  Gait / Transfers Assistance Needed: Mod I, uses a SPC occasionally ADL's / Homemaking Assistance Needed: Pt's wife assists with some ADLs Communication / Swallowing Assistance Needed: Independent          OT Problem List: Decreased strength;Decreased range of motion;Decreased activity tolerance;Impaired balance (sitting and/or standing);Decreased coordination;Impaired vision/perception;Decreased cognition;Decreased safety awareness;Decreased knowledge of use of DME or AE;Decreased knowledge of precautions;Obesity;Pain      OT Treatment/Interventions: Self-care/ADL training;Therapeutic exercise;Neuromuscular education;Energy conservation;DME and/or AE instruction;Manual therapy;Therapeutic activities;Cognitive remediation/compensation;Visual/perceptual remediation/compensation;Patient/family education;Balance training    OT Goals(Current goals can be found in the care plan section) Acute Rehab OT Goals Patient Stated Goal: none stated by patient. wife wants to help continue toward return hom OT  Goal Formulation: With patient/family Time For Goal Achievement: 12/25/18 Potential to Achieve Goals: Good  OT Frequency: Min 3X/week   Barriers to D/C:            Co-evaluation              AM-PAC OT "6 Clicks" Daily Activity     Outcome Measure Help from another person eating meals?: A Little Help from another person taking care of personal grooming?: A Little Help from another person toileting, which includes using toliet, bedpan, or urinal?: A Lot Help from another person bathing (including washing, rinsing, drying)?: A Lot Help from another person to put on and taking off regular upper body clothing?: A Lot Help from another person to put on and taking off regular lower body clothing?: Total 6 Click Score: 13   End of Session Equipment Utilized During Treatment: Gait belt;Rolling walker;Other (comment)(stedy) Nurse Communication: Mobility status;Precautions;Weight bearing status  Activity Tolerance: Patient tolerated treatment well Patient left: in chair;with call bell/phone within reach;with family/visitor present  OT Visit Diagnosis: Unsteadiness on feet (R26.81);Muscle weakness (generalized) (M62.81);Pain Pain - Right/Left: Left Pain - part of body: Leg                Time: 1020-1105 OT Time Calculation (min): 45 min Charges:  OT General Charges $OT Visit: 1 Visit OT Evaluation $OT Eval High Complexity: 1 High   Jeri Modena, OTR/L  Acute Rehabilitation Services Pager: [phone] Office: [phone] .   Jeri Modena 12/11/2018, 11:09 AM

## 2018-12-11 NOTE — Progress Notes (Signed)
Patient ID: [name] [name], male   DOB: 07-15-1951, 68 y.o.   MRN: [id number] Admit to unit via chair. Oriented to unit, medication schedule and plan of care. PT and wife state an understanding of information reviewed. Margarito Liner

## 2018-12-11 NOTE — Progress Notes (Signed)
Inpatient Rehab Admissions:  I have MD approval and will plan to admit pt to CIR today.  Please call me with questions.    Michel Santee, PT, DPT Admissions Coordinator 12/11/18  12:47 PM

## 2018-12-11 NOTE — Progress Notes (Signed)
Patient discharged to inpatient rehab.  Wife accompanied him to his room and patient was in good spirits with no pain.

## 2018-12-11 NOTE — Telephone Encounter (Signed)
I dont do inpatient medicine.  If they have a complaint, they can contact the office of patient experience for assistance.  They can also try and talk with a nurse manager on the floor he is on.

## 2018-12-11 NOTE — Plan of Care (Signed)
  Problem: Activity: Goal: Ability to ambulate and perform ADLs will improve Outcome: Progressing   Problem: Clinical Measurements: Goal: Postoperative complications will be avoided or minimized Outcome: Progressing   Problem: Self-Concept: Goal: Ability to maintain and perform role responsibilities to the fullest extent possible will improve Outcome: Progressing   Problem: Pain Management: Goal: Pain level will decrease Outcome: Progressing   Problem: Education: Goal: Knowledge of General Education information will improve Description Including pain rating scale, medication(s)/side effects and non-pharmacologic comfort measures Outcome: Progressing   Problem: Health Behavior/Discharge Planning: Goal: Ability to manage health-related needs will improve Outcome: Progressing   Problem: Clinical Measurements: Goal: Ability to maintain clinical measurements within normal limits will improve Outcome: Progressing Goal: Will remain free from infection Outcome: Progressing Goal: Diagnostic test results will improve Outcome: Progressing Goal: Respiratory complications will improve Outcome: Progressing Goal: Cardiovascular complication will be avoided Outcome: Progressing   Problem: Activity: Goal: Risk for activity intolerance will decrease Outcome: Progressing   Problem: Nutrition: Goal: Adequate nutrition will be maintained Outcome: Progressing   Problem: Coping: Goal: Level of anxiety will decrease Outcome: Progressing   Problem: Elimination: Goal: Will not experience complications related to bowel motility Outcome: Progressing Goal: Will not experience complications related to urinary retention Outcome: Progressing   Problem: Pain Managment: Goal: General experience of comfort will improve Outcome: Progressing   Problem: Safety: Goal: Ability to remain free from injury will improve Outcome: Progressing   Problem: Skin Integrity: Goal: Risk for impaired skin  integrity will decrease Outcome: Progressing

## 2018-12-11 NOTE — H&P (Signed)
Physical Medicine and Rehabilitation Admission H&P    Chief Complaint  Patient presents with  . Fall   Chief complaint: Hip pain  HPI: [name] [name] is a 68 year old right-handed male with history of Parkinson's disease followed by neurology services Dr. [name] Tat  maintained on Sinemet 25-100 ,Comtan 200 mg 3 times a day, depression maintained on Wellbutrin 150 mg every morning and 300 mg every evening Celexa 40 mg daily, chronic idiopathic constipation. Per chart review patient lives with supportive wife. Modified independent using a straight point cane occasionally. Two-level home with bedroom bath upstairs 8 steps to entry. Wife does assist with some basic ADLs. Presented 12/08/2018 after a fall while walking down into his garage. He states he missed the last step. He did not hit his head or black out. Patient noted significant left hip pain. X-rays and imaging revealed left femoral neck fracture. Underwent total hip replacement uncemented 12/09/2018 per Dr.Xu with anterior approach. Hospital course pain management and weightbearing as tolerated left lower extremity. Acute blood loss anemia 9.3. subcutaneous Lovenox for DVT prophylaxis. Therapy evaluations completed with recommendations of physical medicine rehabilitation consult. Patient was admitted for a comprehensive rehabilitation program.  Review of Systems  Constitutional: Negative for chills, fever and weight loss.  HENT: Negative for hearing loss.   Eyes: Negative for blurred vision and double vision.  Respiratory: Negative for cough and shortness of breath.   Cardiovascular: Negative for chest pain, palpitations and leg swelling.  Gastrointestinal: Positive for constipation. Negative for heartburn, nausea and vomiting.  Genitourinary: Positive for urgency. Negative for dysuria, flank pain and hematuria.  Musculoskeletal: Positive for falls and myalgias.  Skin: Negative for rash.  Neurological: Positive for tremors and  weakness.       Occasional headache and dizziness  Psychiatric/Behavioral: Positive for depression. The patient has insomnia.   All other systems reviewed and are negative.  Past Medical History:  Diagnosis Date  .  OSA (obstructive sleep apnea)    npsg 2012:  AHI 67/hr. Auto titration 2012:  Optimal pressure 12cm.   . APPENDECTOMY, HX OF 02/18/2008   Qualifier: Diagnosis of  By: [place], [place]    . Cardiac murmur    as a child  . Chronic idiopathic constipation   . Depression   . Headache(784.0)   . HEADACHES, HX OF 02/18/2008   Qualifier: Diagnosis of  By: Danny Lawless CMA, [place]    . Parkinson disease ([place]) 11/2014  . Sinus mucosal thickening    pt unable to lay flat  . Streptococcal meningitis    as an infant   Past Surgical History:  Procedure Laterality Date  . APPENDECTOMY  1967  . COLONOSCOPY    . NASAL SINUS SURGERY     x 4 as a child  . TOTAL HIP ARTHROPLASTY Left 12/09/2018   Procedure: TOTAL HIP ARTHROPLASTY ANTERIOR APPROACH;  Surgeon: Leandrew Koyanagi, MD;  Location: [facility];  Service: Orthopedics;  Laterality: Left;  Marland Kitchen VASECTOMY     Family History  Problem Relation Age of Onset  . Lung cancer Father   . Alcohol abuse Brother   . Drug abuse Brother   . Seizures Daughter   . Colon cancer Neg Hx   . Stomach cancer Neg Hx   . Rectal cancer Neg Hx   . Esophageal cancer Neg Hx    Social History:  reports that he has never smoked. He has never used smokeless tobacco. He reports that he does not drink alcohol or use  drugs. Allergies: No Known Allergies Medications Prior to Admission  Medication Sig Dispense Refill  . B Complex-C (B-COMPLEX WITH VITAMIN C) tablet Take 1 tablet by mouth daily.    Marland Kitchen buPROPion (WELLBUTRIN XL) 150 MG 24 hr tablet Take 1 tablet (150 mg total) by mouth every morning. 90 tablet 0  . buPROPion (WELLBUTRIN XL) 300 MG 24 hr tablet TAKE 1 TABLET BY MOUTH EVERY DAY IN THE MORNING 90 tablet 0  . carbidopa-levodopa (SINEMET IR) 25-100 MG tablet 2  IN THE MORNING, 2 IN THE AFTERNOON, AND 1 IN THE EVENING (Patient taking differently: Take 1-2 tablets by mouth See admin instructions. Take 2 tabs at 7am, Take 2 tabs at 11 am, and Take 2 tabs at 4 pm.) 450 tablet 1  . citalopram (CELEXA) 40 MG tablet Take 1 tablet (40 mg total) by mouth daily. 90 tablet 0  . entacapone (COMTAN) 200 MG tablet Take 1 tablet (200 mg total) by mouth 3 (three) times daily. 90 tablet 5  . fesoterodine (TOVIAZ) 4 MG TB24 tablet Take 1 tablet (4 mg total) by mouth daily. 30 tablet 0  . LINZESS 290 MCG CAPS capsule TAKE 1 CAPSULE BY MOUTH DAILY BEFORE BREAKFAST. (Patient taking differently: Take 290 mcg by mouth daily before breakfast. ) 30 capsule 5  . Omega 3-6-9 Fatty Acids (OMEGA 3-6-9 PO) Take 1 capsule by mouth daily.     Marland Kitchen rOPINIRole (REQUIP) 1 MG tablet Take 1 tablet (1 mg total) by mouth 3 (three) times daily. 270 tablet 1  . TURMERIC CURCUMIN PO Take 1 capsule by mouth daily.     . sildenafil (VIAGRA) 100 MG tablet Take 0.5-1 tablets (50-100 mg total) by mouth daily as needed for erectile dysfunction. (Patient not taking: Reported on 08/16/2018) 10 tablet 2  . Trospium Chloride 60 MG CP24 Take 60 mg by mouth daily.  11    Drug Regimen Review Drug regimen was reviewed and remains appropriate with no significant issues identified  Home: Home Living Family/patient expects to be discharged to:: Private residence Living Arrangements: Spouse/significant other Available Help at Discharge: Family, Available PRN/intermittently Type of Home: House Home Access: Stairs to enter [employer] of Steps: 8-9 Entrance Stairs-Rails: Right Home Layout: Two level, Bed/bath upstairs Alternate Level Stairs-Number of Steps: flight Bathroom Shower/Tub: Multimedia programmer: Standard Home Equipment: Cane - quad, Radio producer - single point Additional Comments: Pt sleeps in lift chair; fall down bottom step in garage resulting in hip fracture   Functional  History: Prior Function Level of Independence: Needs assistance Gait / Transfers Assistance Needed: Mod I, uses a SPC occasionally ADL's / Homemaking Assistance Needed: Pt's wife assists with some ADLs Communication / Swallowing Assistance Needed: Independent  Functional Status:  Mobility: Bed Mobility Overal bed mobility: Needs Assistance Bed Mobility: Supine to Sit Supine to sit: HOB elevated, Total assist, +2 for physical assistance General bed mobility comments: Pt required totalA for bed mobility, maxA for LE management, hip movement and trunk elevation, max cues for hand placement and rolling; very painful and overall trunk extensor tone throughout, especially once sitting EOB- required manual assist to bend knees L >R  Transfers Overall transfer level: Needs assistance Transfer via Lift Equipment: Stedy Transfers: Sit to/from Stand Sit to Stand: Max assist, Min guard, From elevated surface General transfer comment: MaxA+2 for boost from elevated bed using Stedy- assist for hip extension and cues for stand upright; MinA to min guard for stand from [place] seat- progressed to requiring less assist as pt  became more confident and less fearful of falling; Min guard of 2 for pt to control descent to chair using Stedy for UE support; heavy reliance on UEs for sit to/from stand Ambulation/Gait General Gait Details: did not attempt today    ADL:    Cognition: Cognition Overall Cognitive Status: History of cognitive impairments - at baseline Orientation Level: Oriented X4 Cognition Arousal/Alertness: Awake/alert Behavior During Therapy: WFL for tasks assessed/performed, Anxious Overall Cognitive Status: History of cognitive impairments - at baseline General Comments: Pt with Parkinson's disease, potential mild cognitive deficits at baseline; required multiple, simple cues for movement; responds to external cuing  Physical Exam: Blood pressure 123/74, pulse 82, temperature 98.2 F  (36.8 C), temperature source Oral, resp. rate 18, height 6\' 2"  (1.88 m), weight 133.8 kg, SpO2 93 %. Physical Exam  Constitutional: He appears well-developed. No distress.  HENT:  Head: Normocephalic and atraumatic.  Eyes: Pupils are equal, round, and reactive to light. EOM are normal.  Neck: Normal range of motion. No tracheal deviation present. No thyromegaly present.  Cardiovascular: Normal rate. Exam reveals no friction rub.  Murmur heard. Respiratory: Effort normal and breath sounds normal. No respiratory distress. He has no wheezes.  GI: Soft. He exhibits distension.  Decreased bowel sounds  Musculoskeletal:        General: Edema present.  Neurological:  Patient is alert. No acute distress. Makes good eye contact and follows full commands. Mood is a bit flat and blunted but appropriate. Masked facies. cogwheeling rigidity in all 4 limbs. Speech dysarthric. UE 4+/5 bilaterally. LLE limited by pain, can move ankle without limitations. RLE: 3-4/5 prox to distal. No focal sensory deficits appreciated  Skin: Skin is warm and dry. He is not diaphoretic.  Psychiatric: He has a normal mood and affect.  Flat, sometimes anxious    Results for orders placed or performed during the hospital encounter of 12/08/18 (from the past 48 hour(s))  Type and screen [place]     Status: None   Collection Time: 12/09/18 12:17 PM  Result Value Ref Range   ABO/RH(D) O POS    Antibody Screen NEG    Sample Expiration      12/12/2018 Performed at [facility], [place] [address], [place], [place] [address]   CBC     Status: Abnormal   Collection Time: 12/09/18 12:17 PM  Result Value Ref Range   WBC 14.0 (H) 4.0 - 10.5 K/uL   RBC 3.73 (L) 4.22 - 5.81 MIL/uL   Hemoglobin 11.5 (L) 13.0 - 17.0 g/dL   HCT 34.8 (L) 39.0 - 52.0 %   MCV 93.3 80.0 - 100.0 fL   MCH 30.8 26.0 - 34.0 pg   MCHC 33.0 30.0 - 36.0 g/dL   RDW 12.7 11.5 - 15.5 %   Platelets 229 150 - 400 K/uL   nRBC 0.0 0.0  - 0.2 %    Comment: Performed at [facility], [place] [address], [place], [place] [address]  Creatinine, serum     Status: None   Collection Time: 12/09/18 12:17 PM  Result Value Ref Range   Creatinine, Ser 1.18 0.61 - 1.24 mg/dL   GFR calc non Af Amer >60 >60 mL/min   GFR calc Af Amer >60 >60 mL/min    Comment: Performed at [facility] [address], [place], [place] [address]  ABO/Rh     Status: None   Collection Time: 12/09/18 12:20 PM  Result Value Ref Range  ABO/RH(D)      O POS Performed at [facility], [place] [address] [place], [place] [address]   CBC     Status: Abnormal   Collection Time: 12/10/18  3:27 AM  Result Value Ref Range   WBC 13.9 (H) 4.0 - 10.5 K/uL   RBC 3.18 (L) 4.22 - 5.81 MIL/uL   Hemoglobin 9.8 (L) 13.0 - 17.0 g/dL   HCT 29.1 (L) 39.0 - 52.0 %   MCV 91.5 80.0 - 100.0 fL   MCH 30.8 26.0 - 34.0 pg   MCHC 33.7 30.0 - 36.0 g/dL   RDW 12.8 11.5 - 15.5 %   Platelets 202 150 - 400 K/uL   nRBC 0.0 0.0 - 0.2 %    Comment: Performed at [facility], [place] [address] [place], [place] [address]  Basic metabolic panel     Status: Abnormal   Collection Time: 12/10/18  3:27 AM  Result Value Ref Range   Sodium 136 135 - 145 mmol/L   Potassium 4.1 3.5 - 5.1 mmol/L   Chloride 106 98 - 111 mmol/L   CO2 23 22 - 32 mmol/L   Glucose, Bld 139 (H) 70 - 99 mg/dL   BUN 15 8 - 23 mg/dL   Creatinine, Ser 1.24 0.61 - 1.24 mg/dL   Calcium 8.2 (L) 8.9 - 10.3 mg/dL   GFR calc non Af Amer >60 >60 mL/min   GFR calc Af Amer >60 >60 mL/min   Anion gap 7 5 - 15    Comment: Performed at [facility], [place] [address], [place], [place] [address]  CBC     Status: Abnormal   Collection Time: 12/11/18  3:13 AM  Result Value Ref Range   WBC 10.5 4.0 - 10.5 K/uL   RBC 3.06 (L) 4.22 - 5.81 MIL/uL   Hemoglobin 9.3 (L) 13.0 - 17.0 g/dL   HCT 27.7 (L) 39.0 - 52.0 %   MCV 90.5 80.0 - 100.0 fL   MCH 30.4 26.0 - 34.0 pg   MCHC 33.6 30.0 - 36.0 g/dL   RDW 13.0  11.5 - 15.5 %   Platelets 181 150 - 400 K/uL   nRBC 0.0 0.0 - 0.2 %    Comment: Performed at [facility], [place] [address] [place], [place] [address]  Basic metabolic panel     Status: Abnormal   Collection Time: 12/11/18  3:13 AM  Result Value Ref Range   Sodium 137 135 - 145 mmol/L   Potassium 3.8 3.5 - 5.1 mmol/L   Chloride 105 98 - 111 mmol/L   CO2 23 22 - 32 mmol/L   Glucose, Bld 129 (H) 70 - 99 mg/dL   BUN 19 8 - 23 mg/dL   Creatinine, Ser 1.10 0.61 - 1.24 mg/dL   Calcium 8.5 (L) 8.9 - 10.3 mg/dL   GFR calc non Af Amer >60 >60 mL/min   GFR calc Af Amer >60 >60 mL/min   Anion gap 9 5 - 15    Comment: Performed at [facility], [place] [address] [place], [place] [address]   Pelvis Portable  Result Date: 12/09/2018 CLINICAL DATA:  Status post left hip replacement EXAM: PORTABLE PELVIS 1 VIEWS COMPARISON:  None. FINDINGS: Left hip prosthesis is noted in satisfactory position. No acute soft tissue or bony abnormality is noted. IMPRESSION: Status post left hip replacement Electronically Signed   By: Inez Catalina M.D.   On: 12/09/2018 12:00  Dg C-arm 1-60 Min  Result Date: 12/09/2018 CLINICAL DATA:  Left femoral neck fracture EXAM: OPERATIVE LEFT HIP WITH PELVIS; DG C-ARM 61-120 MIN COMPARISON:  None. FLUOROSCOPY TIME:  Fluoroscopy Time:  38 seconds Radiation Exposure Index (if provided by the fluoroscopic device): Not available Number of Acquired Spot Images: 6 FINDINGS: Initial images again demonstrate the femoral neck fracture. Subsequent removal of the femoral head with placement of the acetabular component is seen. Left femoral portion of the prosthesis is then placed in satisfactory position. IMPRESSION: Status post left hip replacement. Electronically Signed   By: Inez Catalina M.D.   On: 12/09/2018 12:00   Dg Hip Operative Unilat W Or W/o Pelvis Left  Result Date: 12/09/2018 CLINICAL DATA:  Left femoral neck fracture EXAM: OPERATIVE LEFT HIP WITH PELVIS; DG C-ARM 61-120  MIN COMPARISON:  None. FLUOROSCOPY TIME:  Fluoroscopy Time:  38 seconds Radiation Exposure Index (if provided by the fluoroscopic device): Not available Number of Acquired Spot Images: 6 FINDINGS: Initial images again demonstrate the femoral neck fracture. Subsequent removal of the femoral head with placement of the acetabular component is seen. Left femoral portion of the prosthesis is then placed in satisfactory position. IMPRESSION: Status post left hip replacement. Electronically Signed   By: Inez Catalina M.D.   On: 12/09/2018 12:00       Medical Problem List and Plan: 1.  Decreased functional mobility secondary to  Left femoral neck fracture.S/Pleft total hip replacement uncemented anterior approach 12/09/2018. Weightbearing as tolerated  -admit to inpatient rehab 2.  Antithrombotics: -DVT/anticoagulation:  Subcutaneous Lovenox. Check vascular study  -antiplatelet therapy:   3. Pain Management: Robaxin and oxycodone as needed 4. Mood:  Wellbutrin 150 mg daily and 300 mg daily at bedtime, Celexa 40 mg daily  -antipsychotic agents: see above 5. Neuropsych: This patient is capable of making decisions on his own behalf. 6. Skin/Wound Care:  Routine skin checks 7. Fluids/Electrolytes/Nutrition:  Routine in and out's with follow-up chemistries 8. Acute blood loss anemia. Follow-up CBC 9. Parkinson disease. Sinemet 25-100 1-2 tablets 3 times a day,Comtan 200 mg 3 times a day,Requip 1 mg 3 times a day,. WILL CONFIRM TIMING WITH FAMILY 10. Chronic idiopathic constipation. Linzess290 mcg daily 11. Overactive bladder. Toviaz 4 mg daily     Cathlyn Parsons, PA-C 12/11/2018

## 2018-12-11 NOTE — Telephone Encounter (Signed)
I really don't think this is something we can do. Dr. Carles Collet - please advise.

## 2018-12-11 NOTE — IPOC Note (Signed)
Overall Plan of Care [facility]) Patient Details Name: [name] [name] MRN: [id number] DOB: 09-Jan-1951  Admitting Diagnosis: Left hip fracture  Hospital Problems: Active Problems:   Left displaced femoral neck fracture (HCC)   Acute blood loss anemia   Leukocytosis   Episode of recurrent major depressive disorder (HCC)   Hypoalbuminemia due to protein-calorie malnutrition ([place])     Functional Problem List: Nursing Edema, Skin Integrity  PT Balance, Behavior, Edema, Endurance, Motor, Perception, Pain, Safety, Skin Integrity  OT Balance, Cognition, Motor, Endurance, Safety  SLP    TR         Basic ADL's: OT Grooming, Bathing, Dressing, Toileting     Advanced  ADL's: OT       Transfers: PT Bed Mobility, Bed to Chair, Car, [employer], Futures trader, Metallurgist: PT Ambulation, Emergency planning/management officer, Stairs     Additional Impairments: OT    SLP        TR      Anticipated Outcomes Item Anticipated Outcome  Self Feeding independent  Swallowing      Basic self-care  min assist  Toileting  min assist   Bathroom Transfers min assist  Bowel/Bladder  manage bowel and bladder with min assist  Transfers  Min assist with LRAD   Locomotion  ambulatory Min assist with LRAd   Communication     Cognition     Pain  Pain managed at or below level 4  Safety/Judgment  maintain safety with min assist   Therapy Plan: PT Intensity: Minimum of 1-2 x/day ,45 to 90 minutes PT Frequency: 5 out of 7 days PT Duration Estimated Length of Stay: 16-21 days   OT Intensity: Minimum of 1-2 x/day, 45 to 90 minutes OT Frequency: 5 out of 7 days OT Duration/Estimated Length of Stay: 17 days      Team Interventions: Nursing Interventions Psychosocial Support, Disease Management/Prevention, Medication Management, Discharge Planning, Patient/Family Education, Bowel Management, Bladder Management  PT interventions Ambulation/gait training, Balance/vestibular training,  Cognitive remediation/compensation, Disease management/prevention, Discharge planning, DME/adaptive equipment instruction, Functional electrical stimulation, Functional mobility training, Neuromuscular re-education, Patient/family education, Psychosocial support, Pain management, Skin care/wound management, Splinting/orthotics, Stair training, Therapeutic Activities, Therapeutic Exercise, UE/LE Strength taining/ROM, UE/LE Coordination activities, Wheelchair propulsion/positioning, Community reintegration, Visual/perceptual remediation/compensation  OT Interventions Training and development officer, Academic librarian, Discharge planning, Cognitive remediation/compensation, Engineer, drilling, Brewing technologist, Barrister's clerk education, Therapeutic Exercise, UE/LE Coordination activities, Wheelchair propulsion/positioning, Self Care/advanced ADL retraining, Pain management, Functional mobility training, Therapeutic Activities, UE/LE Strength taining/ROM  SLP Interventions    TR Interventions    SW/CM Interventions Discharge Planning, Psychosocial Support, Patient/Family Education   Barriers to Discharge MD  Medical stability  Nursing      PT Inaccessible home environment, Home environment access/layout, Behavior    OT Inaccessible home environment full baths on second floor as well as steps to get in from outside  SLP      SW       Team Discharge Planning: Destination: PT-Home ,OT- Home , SLP-  Projected Follow-up: PT-Home health PT, OT-  Home health OT, 24 hour supervision/assistance, SLP-  Projected Equipment Needs: PT-Wheelchair cushion (measurements), Wheelchair (measurements), Rolling walker with 5" wheels, OT- To be determined, SLP-  Equipment Details: PT- , OT-  Patient/family involved in discharge planning: PT- Patient, Family member/caregiver,  OT-Patient, Family member/caregiver, SLP-   MD ELOS: 10-15 days. Medical Rehab Prognosis:  Good Assessment:  68 year old right-handed male with history of Parkinson's disease followed by neurology services Dr. [name] Tat  maintained on Sinemet 25-100 ,Comtan 200 mg 3 times a day, depression maintained on Wellbutrin 150 mg every morning and 300 mg every evening Celexa 40 mg daily, chronic idiopathic constipation. Presented 12/08/2018 after a fall while walking down into his garage. He states he missed the last step. He did not hit his head or black out. Patient noted significant left hip pain. X-rays and imaging revealed left femoral neck fracture. Underwent total hip replacement uncemented 12/09/2018 per Dr.Xu with anterior approach. Hospital course pain management and weightbearing as tolerated left lower extremity. Acute blood loss anemia. Patient with resulting functional deficits with mobility, transfers, self-care.  Will set goals for Min A with PT/OT.   See Team Conference Notes for weekly updates to the plan of care

## 2018-12-11 NOTE — H&P (Addendum)
Physical Medicine and Rehabilitation Admission H&P        Chief Complaint  Patient presents with  . Fall    Chief complaint: Hip pain   HPI: [name] [name] is a 68 year old right-handed male with history of Parkinson's disease followed by neurology services Dr. [name] Tat  maintained on Sinemet 25-100 ,Comtan 200 mg 3 times a day, depression maintained on Wellbutrin 150 mg every morning and 300 mg every evening Celexa 40 mg daily, chronic idiopathic constipation. Per chart review patient lives with supportive wife. Modified independent using a straight point cane occasionally. Two-level home with bedroom bath upstairs 8 steps to entry. Wife does assist with some basic ADLs. Presented 12/08/2018 after a fall while walking down into his garage. He states he missed the last step. He did not hit his head or black out. Patient noted significant left hip pain. X-rays and imaging revealed left femoral neck fracture. Underwent total hip replacement uncemented 12/09/2018 per Dr.Xu with anterior approach. Hospital course pain management and weightbearing as tolerated left lower extremity. Acute blood loss anemia 9.3. subcutaneous Lovenox for DVT prophylaxis. Therapy evaluations completed with recommendations of physical medicine rehabilitation consult. Patient was admitted for a comprehensive rehabilitation program.   Review of Systems  Constitutional: Negative for chills, fever and weight loss.  HENT: Negative for hearing loss.   Eyes: Negative for blurred vision and double vision.  Respiratory: Negative for cough and shortness of breath.   Cardiovascular: Negative for chest pain, palpitations and leg swelling.  Gastrointestinal: Positive for constipation. Negative for heartburn, nausea and vomiting.  Genitourinary: Positive for urgency. Negative for dysuria, flank pain and hematuria.  Musculoskeletal: Positive for falls and myalgias.  Skin: Negative for rash.  Neurological: Positive for  tremors and weakness.       Occasional headache and dizziness  Psychiatric/Behavioral: Positive for depression. The patient has insomnia.   All other systems reviewed and are negative.       Past Medical History:  Diagnosis Date  .  OSA (obstructive sleep apnea)      npsg 2012:  AHI 67/hr. Auto titration 2012:  Optimal pressure 12cm.   . APPENDECTOMY, HX OF 02/18/2008    Qualifier: Diagnosis of  By: [place], [place]    . Cardiac murmur      as a child  . Chronic idiopathic constipation    . Depression    . Headache(784.0)    . HEADACHES, HX OF 02/18/2008    Qualifier: Diagnosis of  By: Danny Lawless CMA, [place]    . Parkinson disease ([facility]) 11/2014  . Sinus mucosal thickening      pt unable to lay flat  . Streptococcal meningitis      as an infant         Past Surgical History:  Procedure Laterality Date  . APPENDECTOMY   1967  . COLONOSCOPY      . NASAL SINUS SURGERY        x 4 as a child  . TOTAL HIP ARTHROPLASTY Left 12/09/2018    Procedure: TOTAL HIP ARTHROPLASTY ANTERIOR APPROACH;  Surgeon: Leandrew Koyanagi, MD;  Location: [facility];  Service: Orthopedics;  Laterality: Left;  Marland Kitchen VASECTOMY             Family History  Problem Relation Age of Onset  . Lung cancer Father    . Alcohol abuse Brother    . Drug abuse Brother    . Seizures Daughter    . Colon  cancer Neg Hx    . Stomach cancer Neg Hx    . Rectal cancer Neg Hx    . Esophageal cancer Neg Hx      Social History:  reports that he has never smoked. He has never used smokeless tobacco. He reports that he does not drink alcohol or use drugs. Allergies: No Known Allergies       Medications Prior to Admission  Medication Sig Dispense Refill  . B Complex-C (B-COMPLEX WITH VITAMIN C) tablet Take 1 tablet by mouth daily.      Marland Kitchen buPROPion (WELLBUTRIN XL) 150 MG 24 hr tablet Take 1 tablet (150 mg total) by mouth every morning. 90 tablet 0  . buPROPion (WELLBUTRIN XL) 300 MG 24 hr tablet TAKE 1 TABLET BY MOUTH EVERY DAY IN THE  MORNING 90 tablet 0  . carbidopa-levodopa (SINEMET IR) 25-100 MG tablet 2 IN THE MORNING, 2 IN THE AFTERNOON, AND 1 IN THE EVENING (Patient taking differently: Take 1-2 tablets by mouth See admin instructions. Take 2 tabs at 7am, Take 2 tabs at 11 am, and Take 2 tabs at 4 pm.) 450 tablet 1  . citalopram (CELEXA) 40 MG tablet Take 1 tablet (40 mg total) by mouth daily. 90 tablet 0  . entacapone (COMTAN) 200 MG tablet Take 1 tablet (200 mg total) by mouth 3 (three) times daily. 90 tablet 5  . fesoterodine (TOVIAZ) 4 MG TB24 tablet Take 1 tablet (4 mg total) by mouth daily. 30 tablet 0  . LINZESS 290 MCG CAPS capsule TAKE 1 CAPSULE BY MOUTH DAILY BEFORE BREAKFAST. (Patient taking differently: Take 290 mcg by mouth daily before breakfast. ) 30 capsule 5  . Omega 3-6-9 Fatty Acids (OMEGA 3-6-9 PO) Take 1 capsule by mouth daily.       Marland Kitchen rOPINIRole (REQUIP) 1 MG tablet Take 1 tablet (1 mg total) by mouth 3 (three) times daily. 270 tablet 1  . TURMERIC CURCUMIN PO Take 1 capsule by mouth daily.       . sildenafil (VIAGRA) 100 MG tablet Take 0.5-1 tablets (50-100 mg total) by mouth daily as needed for erectile dysfunction. (Patient not taking: Reported on 08/16/2018) 10 tablet 2  . Trospium Chloride 60 MG CP24 Take 60 mg by mouth daily.   11      Drug Regimen Review Drug regimen was reviewed and remains appropriate with no significant issues identified   Home: Home Living Family/patient expects to be discharged to:: Private residence Living Arrangements: Spouse/significant other Available Help at Discharge: Family, Available PRN/intermittently Type of Home: House Home Access: Stairs to enter [employer] of Steps: 8-9 Entrance Stairs-Rails: Right Home Layout: Two level, Bed/bath upstairs Alternate Level Stairs-Number of Steps: flight Bathroom Shower/Tub: Multimedia programmer: Standard Home Equipment: Cane - quad, Radio producer - single point Additional Comments: Pt sleeps in lift  chair; fall down bottom step in garage resulting in hip fracture   Functional History: Prior Function Level of Independence: Needs assistance Gait / Transfers Assistance Needed: Mod I, uses a SPC occasionally ADL's / Homemaking Assistance Needed: Pt's wife assists with some ADLs Communication / Swallowing Assistance Needed: Independent   Functional Status:  Mobility: Bed Mobility Overal bed mobility: Needs Assistance Bed Mobility: Supine to Sit Supine to sit: HOB elevated, Total assist, +2 for physical assistance General bed mobility comments: Pt required totalA for bed mobility, maxA for LE management, hip movement and trunk elevation, max cues for hand placement and rolling; very painful and overall trunk extensor tone throughout,  especially once sitting EOB- required manual assist to bend knees L >R  Transfers Overall transfer level: Needs assistance Transfer via Lift Equipment: Stedy Transfers: Sit to/from Stand Sit to Stand: Max assist, Min guard, From elevated surface General transfer comment: MaxA+2 for boost from elevated bed using Stedy- assist for hip extension and cues for stand upright; MinA to min guard for stand from [employer] seat- progressed to requiring less assist as pt became more confident and less fearful of falling; Min guard of 2 for pt to control descent to chair using Stedy for UE support; heavy reliance on UEs for sit to/from stand Ambulation/Gait General Gait Details: did not attempt today   ADL:   Cognition: Cognition Overall Cognitive Status: History of cognitive impairments - at baseline Orientation Level: Oriented X4 Cognition Arousal/Alertness: Awake/alert Behavior During Therapy: WFL for tasks assessed/performed, Anxious Overall Cognitive Status: History of cognitive impairments - at baseline General Comments: Pt with Parkinson's disease, potential mild cognitive deficits at baseline; required multiple, simple cues for movement; responds to external  cuing   Physical Exam: Blood pressure 123/74, pulse 82, temperature 98.2 F (36.8 C), temperature source Oral, resp. rate 18, height 6\' 2"  (1.88 m), weight 133.8 kg, SpO2 93 %. Physical Exam  Constitutional: He appears well-developed. No distress.  HENT:  Head: Normocephalic and atraumatic.  Eyes: Pupils are equal, round, and reactive to light. EOM are normal.  Neck: Normal range of motion. No tracheal deviation present. No thyromegaly present.  Cardiovascular: Normal rate. Exam reveals no friction rub.  Murmur heard. Respiratory: Effort normal and breath sounds normal. No respiratory distress. He has no wheezes.  GI: Soft. He exhibits distension.  Decreased bowel sounds  Musculoskeletal:        General: Edema present. Left hip tender Neurological:  Patient is alert. No acute distress. Makes good eye contact and follows full commands. Mood is a bit flat and blunted but appropriate. Masked facies. cogwheeling rigidity in all 4 limbs. Speech dysarthric. UE 4+/5 bilaterally. LLE limited by pain, can move ankle without limitations. RLE: 3-4/5 prox to distal. No focal sensory deficits appreciated  Skin: Skin is warm and dry. He is not diaphoretic. Left hip wound with foam dressing, no appreciable drainage.  Psychiatric: He has a normal mood and affect.  Flat, sometimes anxious      Lab Results Last 48 Hours        Results for orders placed or performed during the hospital encounter of 12/08/18 (from the past 48 hour(s))  Type and screen [place]     Status: None    Collection Time: 12/09/18 12:17 PM  Result Value Ref Range    ABO/RH(D) O POS      Antibody Screen NEG      Sample Expiration          12/12/2018 Performed at [facility], [place] [address], [place], [place] [address]    CBC     Status: Abnormal    Collection Time: 12/09/18 12:17 PM  Result Value Ref Range    WBC 14.0 (H) 4.0 - 10.5 K/uL    RBC 3.73 (L) 4.22 - 5.81 MIL/uL    Hemoglobin 11.5 (L)  13.0 - 17.0 g/dL    HCT 34.8 (L) 39.0 - 52.0 %    MCV 93.3 80.0 - 100.0 fL    MCH 30.8 26.0 - 34.0 pg    MCHC 33.0 30.0 - 36.0 g/dL    RDW 12.7 11.5 - 15.5 %  Platelets 229 150 - 400 K/uL    nRBC 0.0 0.0 - 0.2 %      Comment: Performed at [facility], [place] [address], [place], [place] [address]  Creatinine, serum     Status: None    Collection Time: 12/09/18 12:17 PM  Result Value Ref Range    Creatinine, Ser 1.18 0.61 - 1.24 mg/dL    GFR calc non Af Amer >60 >60 mL/min    GFR calc Af Amer >60 >60 mL/min      Comment: Performed at [facility] [address] [place], [place] [address]  ABO/Rh     Status: None    Collection Time: 12/09/18 12:20 PM  Result Value Ref Range    ABO/RH(D)          O POS Performed at [facility] [address] [place], [place] [address]    CBC     Status: Abnormal    Collection Time: 12/10/18  3:27 AM  Result Value Ref Range    WBC 13.9 (H) 4.0 - 10.5 K/uL    RBC 3.18 (L) 4.22 - 5.81 MIL/uL    Hemoglobin 9.8 (L) 13.0 - 17.0 g/dL    HCT 29.1 (L) 39.0 - 52.0 %    MCV 91.5 80.0 - 100.0 fL    MCH 30.8 26.0 - 34.0 pg    MCHC 33.7 30.0 - 36.0 g/dL    RDW 12.8 11.5 - 15.5 %    Platelets 202 150 - 400 K/uL    nRBC 0.0 0.0 - 0.2 %      Comment: Performed at [facility], [place] [address] [place], [place] [address]  Basic metabolic panel     Status: Abnormal    Collection Time: 12/10/18  3:27 AM  Result Value Ref Range    Sodium 136 135 - 145 mmol/L    Potassium 4.1 3.5 - 5.1 mmol/L    Chloride 106 98 - 111 mmol/L    CO2 23 22 - 32 mmol/L    Glucose, Bld 139 (H) 70 - 99 mg/dL    BUN 15 8 - 23 mg/dL    Creatinine, Ser 1.24 0.61 - 1.24 mg/dL    Calcium 8.2 (L) 8.9 - 10.3 mg/dL    GFR calc non Af Amer >60 >60 mL/min    GFR calc Af Amer >60 >60 mL/min    Anion gap 7 5 - 15      Comment: Performed at [facility], [place] [address] [place], [place] [address]  CBC     Status: Abnormal    Collection Time: 12/11/18  3:13  AM  Result Value Ref Range    WBC 10.5 4.0 - 10.5 K/uL    RBC 3.06 (L) 4.22 - 5.81 MIL/uL    Hemoglobin 9.3 (L) 13.0 - 17.0 g/dL    HCT 27.7 (L) 39.0 - 52.0 %    MCV 90.5 80.0 - 100.0 fL    MCH 30.4 26.0 - 34.0 pg    MCHC 33.6 30.0 - 36.0 g/dL    RDW 13.0 11.5 - 15.5 %    Platelets 181 150 - 400 K/uL    nRBC 0.0 0.0 - 0.2 %      Comment: Performed at [facility], [place] [address] [place], [place] [address]  Basic metabolic panel     Status: Abnormal    Collection Time: 12/11/18  3:13 AM  Result Value Ref Range  Sodium 137 135 - 145 mmol/L    Potassium 3.8 3.5 - 5.1 mmol/L    Chloride 105 98 - 111 mmol/L    CO2 23 22 - 32 mmol/L    Glucose, Bld 129 (H) 70 - 99 mg/dL    BUN 19 8 - 23 mg/dL    Creatinine, Ser 1.10 0.61 - 1.24 mg/dL    Calcium 8.5 (L) 8.9 - 10.3 mg/dL    GFR calc non Af Amer >60 >60 mL/min    GFR calc Af Amer >60 >60 mL/min    Anion gap 9 5 - 15      Comment: Performed at [facility] [address], [place], [place] [address]       Imaging Results (Last 48 hours)  Pelvis Portable   Result Date: 12/09/2018 CLINICAL DATA:  Status post left hip replacement EXAM: PORTABLE PELVIS 1 VIEWS COMPARISON:  None. FINDINGS: Left hip prosthesis is noted in satisfactory position. No acute soft tissue or bony abnormality is noted. IMPRESSION: Status post left hip replacement Electronically Signed   By: Inez Catalina M.D.   On: 12/09/2018 12:00    Dg C-arm 1-60 Min   Result Date: 12/09/2018 CLINICAL DATA:  Left femoral neck fracture EXAM: OPERATIVE LEFT HIP WITH PELVIS; DG C-ARM 61-120 MIN COMPARISON:  None. FLUOROSCOPY TIME:  Fluoroscopy Time:  38 seconds Radiation Exposure Index (if provided by the fluoroscopic device): Not available Number of Acquired Spot Images: 6 FINDINGS: Initial images again demonstrate the femoral neck fracture. Subsequent removal of the femoral head with placement of the acetabular component is seen. Left femoral portion of the prosthesis is  then placed in satisfactory position. IMPRESSION: Status post left hip replacement. Electronically Signed   By: Inez Catalina M.D.   On: 12/09/2018 12:00    Dg Hip Operative Unilat W Or W/o Pelvis Left   Result Date: 12/09/2018 CLINICAL DATA:  Left femoral neck fracture EXAM: OPERATIVE LEFT HIP WITH PELVIS; DG C-ARM 61-120 MIN COMPARISON:  None. FLUOROSCOPY TIME:  Fluoroscopy Time:  38 seconds Radiation Exposure Index (if provided by the fluoroscopic device): Not available Number of Acquired Spot Images: 6 FINDINGS: Initial images again demonstrate the femoral neck fracture. Subsequent removal of the femoral head with placement of the acetabular component is seen. Left femoral portion of the prosthesis is then placed in satisfactory position. IMPRESSION: Status post left hip replacement. Electronically Signed   By: Inez Catalina M.D.   On: 12/09/2018 12:00             Medical Problem List and Plan: 1.  Decreased functional mobility secondary to  Left femoral neck fracture.S/Pleft total hip replacement uncemented anterior approach 12/09/2018. Weightbearing as tolerated             -admit to inpatient rehab 2.  Antithrombotics: -DVT/anticoagulation:  Subcutaneous Lovenox. Check vascular study             -antiplatelet therapy:   3. Pain Management: Robaxin and oxycodone as needed 4. Mood:  Wellbutrin 150 mg daily and 300 mg daily at bedtime, Celexa 40 mg daily             -antipsychotic agents: see above 5. Neuropsych: This patient is capable of making decisions on his own behalf. 6. Skin/Wound Care:  Routine skin checks 7. Fluids/Electrolytes/Nutrition:  Routine in and out's with follow-up chemistries 8. Acute blood loss anemia. Follow-up CBC 9. Parkinson disease. Sinemet 25-100 1-2 tablets 3 times a day,Comtan 200 mg 3 times a  day,Requip 1 mg 3 times a day,. WILL CONFIRM TIMING WITH FAMILY 10. Chronic idiopathic constipation. Linzess290 mcg daily  -abdomen very distended. No bm since  admit  -sorbitol today, SSE if needed 11. Overactive bladder. Toviaz 4 mg daily         Elizabeth Sauer 12/11/2018   Post Admission Physician Evaluation: 1. Functional deficits secondary  to left FNF in setting of PD. 2. Patient is admitted to receive collaborative, interdisciplinary care between the physiatrist, rehab nursing staff, and therapy team. 3. Patient's level of medical complexity and substantial therapy needs in context of that medical necessity cannot be provided at a lesser intensity of care such as a SNF. 4. Patient has experienced substantial functional loss from his/her baseline which was documented above under the "Functional History" and "Functional Status" headings.  Judging by the patient's diagnosis, physical exam, and functional history, the patient has potential for functional progress which will result in measurable gains while on inpatient rehab.  These gains will be of substantial and practical use upon discharge  in facilitating mobility and self-care at the household level. 5. Physiatrist will provide 24 hour management of medical needs as well as oversight of the therapy plan/treatment and provide guidance as appropriate regarding the interaction of the two. 6. The Preadmission Screening has been reviewed and patient status is unchanged unless otherwise stated above. 7. 24 hour rehab nursing will assist with bladder management, bowel management, safety, skin/wound care, disease management, medication administration, pain management and patient education  and help integrate therapy concepts, techniques,education, etc. 8. PT will assess and treat for/with: Lower extremity strength, range of motion, stamina, balance, functional mobility, safety, adaptive techniques and equipment, pain mgt, ortho precautions.   Goals are: supervision. 9. OT will assess and treat for/with: ADL's, functional mobility, safety, upper extremity strength, adaptive techniques and  equipment, pain mgt, family ed, ego support.   Goals are: supervision. Therapy may proceed with showering this patient. 10. SLP will assess and treat for/with: n/a.  Goals are: n/a. 11. Case Management and Social Worker will assess and treat for psychological issues and discharge planning. 12. Team conference will be held weekly to assess progress toward goals and to determine barriers to discharge. 13. Patient will receive at least 3 hours of therapy per day at least 5 days per week. 14. ELOS: 14-18 days       15. Prognosis:  excellent   I have personally performed a face to face diagnostic evaluation of this patient and formulated the key components of the plan.  Additionally, I have personally reviewed laboratory data, imaging studies, as well as relevant notes and concur with the physician assistant's documentation above.  Meredith Staggers, MD, [facility]

## 2018-12-11 NOTE — Progress Notes (Signed)
Physical Therapy Treatment Patient Details Name: [name] [name] MRN: [id number] DOB: 1950/12/24 Today's Date: 12/11/2018    History of Present Illness 68 yo male admitted secondary to fall at home, with L hip fracture. He is now s/p L direct anterior THA with an anterior approach. PMH significant for Parkinson's Disease, depression.    PT Comments    Pt received up in Stedy and sitting to chair with nursing; willing to participate in therapy. Co-treat with OT. His wife reports it took nursing 10 minutes to help him out of bed this morning. Pt reports his pain is high. Progressed functional mobility today by standing without Stedy, taking steps (see details below). Pt remains very nervous about falling. Also noted per pt's wife that he receives his medication for Parkinson's 3x/day (7am, 11am are the first two times)- it may benefit pt to be seen in coordination with his medication schedule, as he may have had increased difficulty with therapy today due to timing of therapy with medications. He will continue to benefit from skilled acute PT services in order to continue to progress his functional mobility safely and ensure safe DC. Continue to recommend CIR.    Follow Up Recommendations  CIR(if unable to go to CIR, will require SNF)     Equipment Recommendations  3in1 (PT);Rolling walker with 5" wheels    Recommendations for Other Services       Precautions / Restrictions Precautions Precautions: Fall Restrictions Weight Bearing Restrictions: Yes LLE Weight Bearing: Weight bearing as tolerated    Mobility  Bed Mobility               General bed mobility comments: In [place] with nursing upon arrival  Transfers Overall transfer level: Needs assistance Equipment used: Rolling walker (2 wheeled) Transfers: Sit to/from Stand Sit to Stand: +2 physical assistance;Max assist         General transfer comment: Sit to stand with MaxA+2 with RW for boost from chair, hip  extension with tactile and verbal cues for trunk extension. Cues for sequencing of trunk lean forward before initiating stand. TotalA+3 for descent to chair with RW in front. Later stand with Stedy and ModA+2 for hip extension and anterior weight shift. Overall extensor tone throughout sit to stand transfers.  Ambulation/Gait Ambulation/Gait assistance: +2 physical assistance;+2 safety/equipment;Max assist Gait Distance (Feet): 5 Feet Assistive device: Rolling walker (2 wheeled) Gait Pattern/deviations: Decreased weight shift to left Gait velocity: decreased   General Gait Details: MaxA of 2 for ambulation with RW today. Assist for balance, management of RW, weight shift and progression of LEs. Pt very anxious about falling and rigid, trembling.    Stairs             Wheelchair Mobility    Modified Rankin (Stroke Patients Only)       Balance Overall balance assessment: Needs assistance Sitting-balance support: Bilateral upper extremity supported;Feet supported Sitting balance-Leahy Scale: Poor Sitting balance - Comments: Initially pt required mod-maxA to maintain upright and achieve position where he could maintain upright without assistance due to strong extensor tone; progressed to requiring min guard for sitting balance once in optimal position   Standing balance support: Bilateral upper extremity supported;During functional activity Standing balance-Leahy Scale: Poor Standing balance comment: posterior lean and needs max cues for anterior weight shift                            Cognition  Exercises      General Comments General comments (skin integrity, edema, etc.): IV out on arrival; gauze placed, RN notified      Pertinent Vitals/Pain      Home Living                      Prior Function            PT Goals (current goals can now be found in the care plan section) Acute  Rehab PT Goals Patient Stated Goal: none stated by patient. per wife go to rehab to progress to return home safely PT Goal Formulation: With patient/family Time For Goal Achievement: 12/24/18 Potential to Achieve Goals: Good Progress towards PT goals: Progressing toward goals    Frequency    Min 5X/week      PT Plan Current plan remains appropriate    Co-evaluation PT/OT/SLP Co-Evaluation/Treatment: Yes Reason for Co-Treatment: Complexity of the patient's impairments (multi-system involvement);Necessary to address cognition/behavior during functional activity;For patient/therapist safety;To address functional/ADL transfers PT goals addressed during session: Mobility/safety with mobility;Balance;Proper use of DME;Strengthening/ROM        AM-PAC PT "6 Clicks" Mobility   Outcome Measure  Help needed turning from your back to your side while in a flat bed without using bedrails?: A Lot Help needed moving from lying on your back to sitting on the side of a flat bed without using bedrails?: Total Help needed moving to and from a bed to a chair (including a wheelchair)?: A Lot Help needed standing up from a chair using your arms (e.g., wheelchair or bedside chair)?: A Lot Help needed to walk in hospital room?: Total Help needed climbing 3-5 steps with a railing? : Total 6 Click Score: 9    End of Session Equipment Utilized During Treatment: Gait belt Activity Tolerance: Patient limited by pain;Patient limited by fatigue Patient left: in chair;with call bell/phone within reach;with family/visitor present   PT Visit Diagnosis: History of falling (Z91.81);Unsteadiness on feet (R26.81);Muscle weakness (generalized) (M62.81);Pain;Other symptoms and signs involving the nervous system (R29.898) Pain - Right/Left: Left Pain - part of body: Hip     Time: 1010-1100 PT Time Calculation (min) (ACUTE ONLY): 50 min  Charges:  $Gait Training: 8-22 mins $Therapeutic Activity: 8-22  mins                     Ronnell Guadalajara, SPT    Ronnell Guadalajara 12/11/2018, 2:49 PM

## 2018-12-11 NOTE — Telephone Encounter (Signed)
Patient's wife called regarding him being hospitalized due to him breaking his Left femur at the hip joint. He is at [facility]( Room 32, [place]). He is having a Total hip replacement. He is saying that the staff is not giving him his Parkinson's medication on time on accordance with his meals. They are asking for you to please call and speak with the Staff. Thanks

## 2018-12-11 NOTE — Progress Notes (Signed)
Liquid results from prior enema, SSE administered and tolerated well,

## 2018-12-11 NOTE — Care Management Important Message (Signed)
Important Message  Patient Details  Name: [name] [name] MRN: [id number] Date of Birth: 05-18-1951   Medicare Important Message Given:  Yes    Quiana Cobaugh [address] 12/11/2018, 4:19 PM

## 2018-12-11 NOTE — Discharge Summary (Signed)
Physician Discharge Summary  [name] [name] ULA:[id number] DOB: [birth year] DOA: 12/08/2018  PCP: [name], MD  Admit date: 12/08/2018 Discharge date: 12/11/2018  Time spent: 35 minutes  Recommendations for Outpatient Follow-up:  1. Othopedics Dr. Erlinda Hong in 10 days 2. Continue lovenox for DVT prophylaxis for 3 to 4 weeks only   Discharge Diagnoses:  Principal Problem:   Closed left hip fracture ([place]) Active Problems:   MDD (major depressive disorder), recurrent episode, severe (HCC)   Parkinson's disease ([place])   Closed fracture of neck of left femur ([place])    ACute blood loss anemia  Discharge Condition: stable  Diet recommendation: heart healthy  Filed Weights   12/08/18 1430  Weight: 133.8 kg    History of present illness:  68 year old male with medical history significant for Parkinson's disease depression who sustained a fall earlier afternoon of 12/08/2018 while walking down into his garage he missed a step.  Hospital Course:   1.  Left hip femoral neck fracture -Following mechanical fall -Status post left total hip arthroplasty by Dr. Erlinda Hong 3/8 -PT eval completed, remained stable postop -Started on Lovenox 40 mg subcu daily for DVT prophylaxis continue for 3 to 4 weeks -discharge to CIR for rehabilitation today  2.  Parkinson's disease -Continue Sinemet and Requip per home regimen  3.  Depression -Continue Wellbutrin and Cymbalta  4.  Acute blood loss anemia -Hemoglobin dropped to 9.3 from perioperative blood loss and hemodilution -Recheck in 1 week    Consultant: Orthopedics,  Leandrew Koyanagi, MD    Procedures:  Total left hip replacement by Dr.Xu 3/8    Discharge Exam: Vitals:   12/11/18 0838 12/11/18 1217  BP: 125/63 (!) 145/121  Pulse: 81 95  Resp: 18 18  Temp: 98.3 F (36.8 C) 98.4 F (36.9 C)  SpO2: (!) 89% 90%    General: AAOx3 Cardiovascular: S1S2/RRR Respiratory: CTAB  Discharge Instructions   Discharge  Instructions    Diet - low sodium heart healthy   Complete by:  As directed    Increase activity slowly   Complete by:  As directed    Weight bearing as tolerated   Complete by:  As directed      Allergies as of 12/11/2018   No Known Allergies     Medication List    TAKE these medications   B-complex with vitamin C tablet Take 1 tablet by mouth daily.   buPROPion 300 MG 24 hr tablet Commonly known as:  WELLBUTRIN XL TAKE 1 TABLET BY MOUTH EVERY DAY IN THE MORNING   buPROPion 150 MG 24 hr tablet Commonly known as:  Wellbutrin XL Take 1 tablet (150 mg total) by mouth every morning.   carbidopa-levodopa 25-100 MG tablet Commonly known as:  SINEMET IR 2 IN THE MORNING, 2 IN THE AFTERNOON, AND 1 IN THE EVENING What changed:  See the new instructions.   citalopram 40 MG tablet Commonly known as:  CELEXA Take 1 tablet (40 mg total) by mouth daily.   enoxaparin 40 MG/0.4ML injection Commonly known as:  LOVENOX Inject 0.4 mLs (40 mg total) into the skin daily.   entacapone 200 MG tablet Commonly known as:  COMTAN Take 1 tablet (200 mg total) by mouth 3 (three) times daily.   fesoterodine 4 MG Tb24 tablet Commonly known as:  Toviaz Take 1 tablet (4 mg total) by mouth daily.   Linzess 290 MCG Caps capsule Generic drug:  linaclotide TAKE 1 CAPSULE BY MOUTH DAILY BEFORE BREAKFAST. What changed:  See the new instructions.   methocarbamol 500 MG tablet Commonly known as:  ROBAXIN Take 1 tablet (500 mg total) by mouth every 6 (six) hours as needed for muscle spasms.   OMEGA 3-6-9 PO Take 1 capsule by mouth daily.   oxyCODONE-acetaminophen 5-325 MG tablet Commonly known as:  Percocet Take 1-2 tablets by mouth every 6 (six) hours as needed for severe pain.   polyethylene glycol packet Commonly known as:  MIRALAX / GLYCOLAX Take 17 g by mouth daily as needed for mild constipation.   rOPINIRole 1 MG tablet Commonly known as:  Requip Take 1 tablet (1 mg total) by mouth  3 (three) times daily.   sildenafil 100 MG tablet Commonly known as:  Viagra Take 0.5-1 tablets (50-100 mg total) by mouth daily as needed for erectile dysfunction.   Trospium Chloride 60 MG Cp24 Take 60 mg by mouth daily.   TURMERIC CURCUMIN PO Take 1 capsule by mouth daily.            Discharge Care Instructions  (From admission, onward)         Start     Ordered   12/09/18 0000  Weight bearing as tolerated     12/09/18 1003         No Known Allergies Follow-up Information    Leandrew Koyanagi, MD In 2 weeks.   Specialty:  Orthopedic Surgery Why:  For suture removal, For wound re-check Contact information: [place] [address] [phone]            The results of significant diagnostics from this hospitalization (including imaging, microbiology, ancillary and laboratory) are listed below for reference.    Significant Diagnostic Studies: Pelvis Portable  Result Date: 12/09/2018 CLINICAL DATA:  Status post left hip replacement EXAM: PORTABLE PELVIS 1 VIEWS COMPARISON:  None. FINDINGS: Left hip prosthesis is noted in satisfactory position. No acute soft tissue or bony abnormality is noted. IMPRESSION: Status post left hip replacement Electronically Signed   By: Inez Catalina M.D.   On: 12/09/2018 12:00   Dg Chest Port 1 View  Result Date: 12/08/2018 CLINICAL DATA:  Preop hip fracture. EXAM: PORTABLE CHEST 1 VIEW COMPARISON:  10/02/2013 FINDINGS: Mild cardiomegaly and vascular congestion. Low lung volumes. Left base atelectasis. No confluent opacity on the right. No overt edema, effusions or acute bony abnormality. IMPRESSION: Low lung volumes with mild cardiomegaly, vascular congestion and left base atelectasis. Electronically Signed   By: Rolm Baptise M.D.   On: 12/08/2018 18:50   Dg C-arm 1-60 Min  Result Date: 12/09/2018 CLINICAL DATA:  Left femoral neck fracture EXAM: OPERATIVE LEFT HIP WITH PELVIS; DG C-ARM 61-120 MIN COMPARISON:   None. FLUOROSCOPY TIME:  Fluoroscopy Time:  38 seconds Radiation Exposure Index (if provided by the fluoroscopic device): Not available Number of Acquired Spot Images: 6 FINDINGS: Initial images again demonstrate the femoral neck fracture. Subsequent removal of the femoral head with placement of the acetabular component is seen. Left femoral portion of the prosthesis is then placed in satisfactory position. IMPRESSION: Status post left hip replacement. Electronically Signed   By: Inez Catalina M.D.   On: 12/09/2018 12:00   Dg Hip Operative Unilat W Or W/o Pelvis Left  Result Date: 12/09/2018 CLINICAL DATA:  Left femoral neck fracture EXAM: OPERATIVE LEFT HIP WITH PELVIS; DG C-ARM 61-120 MIN COMPARISON:  None. FLUOROSCOPY TIME:  Fluoroscopy Time:  38 seconds Radiation Exposure Index (if provided by the fluoroscopic device): Not available Number of Acquired  Spot Images: 6 FINDINGS: Initial images again demonstrate the femoral neck fracture. Subsequent removal of the femoral head with placement of the acetabular component is seen. Left femoral portion of the prosthesis is then placed in satisfactory position. IMPRESSION: Status post left hip replacement. Electronically Signed   By: Inez Catalina M.D.   On: 12/09/2018 12:00   Dg Hip Unilat W Or Wo Pelvis 2-3 Views Left  Result Date: 12/08/2018 CLINICAL DATA:  Left leg shortening. EXAM: DG HIP (WITH OR WITHOUT PELVIS) 2-3V LEFT COMPARISON:  None. FINDINGS: Fracture of the left femoral neck. Pelvic bony ring is intact. Right hip is intact. Superior displacement of the left femoral trochanters secondary to the fracture. Left femoral head appears to be located on the cross-table lateral view. Degenerative changes in the lower lumbar spine. IMPRESSION: Fracture of the left femoral neck. Electronically Signed   By: Markus Daft M.D.   On: 12/08/2018 16:30   Dg Femur Min 2 Views Left  Result Date: 12/08/2018 CLINICAL DATA:  Fall with left hip injury EXAM: LEFT FEMUR 2  VIEWS COMPARISON:  None. FINDINGS: Impacted left femoral neck fracture. No additional fracture in left femoral shaft. No evidence of dislocation at the left hip or left knee on these views. No focal osseous lesions. No radiopaque foreign body. IMPRESSION: Impacted left femoral neck fracture, please see the separate concurrent dedicated left hip radiograph report for further details. Electronically Signed   By: Ilona Sorrel M.D.   On: 12/08/2018 16:29    Microbiology: Recent Results (from the past 240 hour(s))  Surgical pcr screen     Status: None   Collection Time: 12/08/18 11:21 PM  Result Value Ref Range Status   MRSA, PCR NEGATIVE NEGATIVE Final   Staphylococcus aureus NEGATIVE NEGATIVE Final    Comment: (NOTE) The Xpert SA Assay (FDA approved for NASAL specimens in patients 7 years of age and older), is one component of a comprehensive surveillance program. It is not intended to diagnose infection nor to guide or monitor treatment. Performed at [facility], [place] [address] [place], [place] [address]      Labs: Basic Metabolic Panel: Recent Labs  Lab 12/08/18 1707 12/09/18 1217 12/10/18 0327 12/11/18 0313  NA 139  --  136 137  K 3.7  --  4.1 3.8  CL 108  --  106 105  CO2 24  --  23 23  GLUCOSE 93  --  139* 129*  BUN 17  --  15 19  CREATININE 1.13 1.18 1.24 1.10  CALCIUM 8.6*  --  8.2* 8.5*   Liver Function Tests: No results for input(s): AST, ALT, ALKPHOS, BILITOT, PROT, ALBUMIN in the last 168 hours. No results for input(s): LIPASE, AMYLASE in the last 168 hours. No results for input(s): AMMONIA in the last 168 hours. CBC: Recent Labs  Lab 12/08/18 1707 12/09/18 1217 12/10/18 0327 12/11/18 0313  WBC 10.5 14.0* 13.9* 10.5  NEUTROABS 8.7*  --   --   --   HGB 12.9* 11.5* 9.8* 9.3*  HCT 40.0 34.8* 29.1* 27.7*  MCV 96.4 93.3 91.5 90.5  PLT 262 229 202 181   Cardiac Enzymes: No results for input(s): CKTOTAL, CKMB, CKMBINDEX, TROPONINI in the last 168  hours. BNP: BNP (last 3 results) No results for input(s): BNP in the last 8760 hours.  ProBNP (last 3 results) No results for input(s): PROBNP in the last 8760 hours.  CBG: No results for input(s): GLUCAP in the last 168 hours.  Signed:  Domenic Polite MD.  [facility] 12/11/2018, 2:18 PM

## 2018-12-12 ENCOUNTER — Inpatient Hospital Stay (HOSPITAL_COMMUNITY): Payer: Medicare Other

## 2018-12-12 ENCOUNTER — Telehealth: Payer: Self-pay | Admitting: *Deleted

## 2018-12-12 ENCOUNTER — Inpatient Hospital Stay (HOSPITAL_COMMUNITY): Payer: Self-pay | Admitting: Occupational Therapy

## 2018-12-12 ENCOUNTER — Inpatient Hospital Stay (HOSPITAL_COMMUNITY): Payer: Self-pay | Admitting: Physical Therapy

## 2018-12-12 DIAGNOSIS — D72829 Elevated white blood cell count, unspecified: Secondary | ICD-10-CM

## 2018-12-12 DIAGNOSIS — F339 Major depressive disorder, recurrent, unspecified: Secondary | ICD-10-CM

## 2018-12-12 DIAGNOSIS — D62 Acute posthemorrhagic anemia: Secondary | ICD-10-CM

## 2018-12-12 DIAGNOSIS — E46 Unspecified protein-calorie malnutrition: Secondary | ICD-10-CM

## 2018-12-12 DIAGNOSIS — R739 Hyperglycemia, unspecified: Secondary | ICD-10-CM

## 2018-12-12 DIAGNOSIS — E8809 Other disorders of plasma-protein metabolism, not elsewhere classified: Secondary | ICD-10-CM

## 2018-12-12 DIAGNOSIS — M7989 Other specified soft tissue disorders: Secondary | ICD-10-CM

## 2018-12-12 LAB — COMPREHENSIVE METABOLIC PANEL
ALBUMIN: 2.9 g/dL — AB (ref 3.5–5.0)
ALT: 33 U/L (ref 0–44)
AST: 65 U/L — ABNORMAL HIGH (ref 15–41)
Alkaline Phosphatase: 46 U/L (ref 38–126)
Anion gap: 9 (ref 5–15)
BILIRUBIN TOTAL: 1.3 mg/dL — AB (ref 0.3–1.2)
BUN: 16 mg/dL (ref 8–23)
CO2: 20 mmol/L — ABNORMAL LOW (ref 22–32)
Calcium: 8.4 mg/dL — ABNORMAL LOW (ref 8.9–10.3)
Chloride: 107 mmol/L (ref 98–111)
Creatinine, Ser: 1.07 mg/dL (ref 0.61–1.24)
GFR calc Af Amer: >60 mL/min (ref >60)
GFR calc non Af Amer: >60 mL/min (ref >60)
GLUCOSE: 146 mg/dL — AB (ref 70–99)
Potassium: 3.7 mmol/L (ref 3.5–5.1)
Sodium: 136 mmol/L (ref 135–145)
Total Protein: 5.8 g/dL — ABNORMAL LOW (ref 6.5–8.1)

## 2018-12-12 LAB — CBC WITH DIFFERENTIAL/PLATELET
Abs Immature Granulocytes: 0.04 10*3/uL (ref 0.00–0.07)
BASOS ABS: 0 10*3/uL (ref 0.0–0.1)
Basophils Relative: 0 %
EOS PCT: 0 %
Eosinophils Absolute: 0 10*3/uL (ref 0.0–0.5)
HCT: 28 % — ABNORMAL LOW (ref 39.0–52.0)
Hemoglobin: 9.8 g/dL — ABNORMAL LOW (ref 13.0–17.0)
Immature Granulocytes: 0 %
Lymphocytes Relative: 5 %
Lymphs Abs: 0.6 10*3/uL — ABNORMAL LOW (ref 0.7–4.0)
MCH: 31.4 pg (ref 26.0–34.0)
MCHC: 35 g/dL (ref 30.0–36.0)
MCV: 89.7 fL (ref 80.0–100.0)
Monocytes Absolute: 0.9 10*3/uL (ref 0.1–1.0)
Monocytes Relative: 8 %
NRBC: 0 % (ref 0.0–0.2)
Neutro Abs: 10.1 10*3/uL — ABNORMAL HIGH (ref 1.7–7.7)
Neutrophils Relative %: 87 %
Platelets: 216 10*3/uL (ref 150–400)
RBC: 3.12 MIL/uL — ABNORMAL LOW (ref 4.22–5.81)
RDW: 12.7 % (ref 11.5–15.5)
WBC: 11.7 10*3/uL — ABNORMAL HIGH (ref 4.0–10.5)

## 2018-12-12 MED ORDER — PRO-STAT SUGAR FREE PO LIQD
30.0000 mL | Freq: Two times a day (BID) | ORAL | Status: DC
  Administered 2018-12-12 – 2018-12-29 (×35): 30 mL via ORAL
  Filled 2018-12-12 (×34): qty 30

## 2018-12-12 MED ORDER — CITALOPRAM HYDROBROMIDE 10 MG PO TABS
20.0000 mg | ORAL_TABLET | Freq: Every day | ORAL | Status: DC
  Administered 2018-12-12 – 2018-12-28 (×17): 20 mg via ORAL
  Filled 2018-12-12 (×19): qty 2

## 2018-12-12 NOTE — Evaluation (Signed)
Occupational Therapy Assessment and Plan  Patient Details  Name: [name] [name] MRN: [id number] Date of Birth: [birth year]  OT Diagnosis: abnormal posture, altered mental status, cognitive deficits and muscle weakness (generalized) Rehab Potential: Rehab Potential (ACUTE ONLY): Good ELOS: 17 days   Today's Date: 12/12/2018 OT Individual Time: 9924-2683 OT Individual Time Calculation (min): 61 min     Problem List:  Patient Active Problem List   Diagnosis Date Noted  . Acute blood loss anemia   . Leukocytosis   . Episode of recurrent major depressive disorder ([place])   . Hypoalbuminemia due to protein-calorie malnutrition ([place])   . Left displaced femoral neck fracture ([place]) 12/11/2018  . Closed fracture of neck of left femur ([place])   . Closed left hip fracture ([place]) 12/08/2018  . Left foot pain 07/26/2018  . TMJ (temporomandibular joint syndrome) 10/10/2017  . Parkinson's disease ([place]) 03/20/2017  . Bilateral lower extremity edema 03/20/2017  . Chronic venous insufficiency 09/13/2016  . Fatigue 03/15/2016  . Parkinsonian features 09/15/2015  . ED (erectile dysfunction) 09/15/2015  . MDD (major depressive disorder), recurrent episode, severe ([place]) 02/10/2014  . Nonspecific abnormal electrocardiogram (ECG) (EKG) 02/07/2014  . Non-compliant behavior 02/03/2014  . Morton's neuroma of left foot 08/07/2013  . 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 Hyperglycemia 01/26/2012  . Hypogonadism male 01/24/2012  . CHEST TIGHTNESS 02/18/2008    Past Medical History:  Past Medical History:  Diagnosis Date  . APPENDECTOMY, HX OF 02/18/2008   Qualifier: Diagnosis of  By: Danny Lawless CMA, [place]    . Cardiac murmur    as a child  . Chronic idiopathic constipation   . Depression   . Headache(784.0)   . HEADACHES, HX OF 02/18/2008   Qualifier: Diagnosis of  By: Danny Lawless CMA, [place]    . Parkinson disease ([place]) 11/2014  . Sinus mucosal thickening    pt  unable to lay flat  . Streptococcal meningitis    as an infant   Past Surgical History:  Past Surgical History:  Procedure Laterality Date  . APPENDECTOMY  1967  . COLONOSCOPY    . NASAL SINUS SURGERY     x 4 as a child  . TOTAL HIP ARTHROPLASTY Left 12/09/2018   Procedure: TOTAL HIP ARTHROPLASTY ANTERIOR APPROACH;  Surgeon: Leandrew Koyanagi, MD;  Location: [place];  Service: Orthopedics;  Laterality: Left;  Marland Kitchen VASECTOMY      Assessment & Plan Clinical Impression: Patient is a 68 y.o. year old male with recent admission to the hospital on 12/08/2018 after a fall while walking down into his garage. He states he missed the last step. He did not hit his head or black out. Patient noted significant left hip pain. X-rays and imaging revealed left femoral neck fracture. Underwent total hip replacement uncemented 12/09/2018 per Dr.Xu with anterior approach. Hospital course pain management and weightbearing as tolerated left lower extremity. Patient transferred to CIR on 12/11/2018 .    Patient currently requires max with basic self-care skills secondary to muscle weakness, decreased initiation, decreased attention, decreased awareness, decreased safety awareness, decreased memory and delayed processing and decreased sitting balance, decreased standing balance, decreased postural control and decreased balance strategies.  Prior to hospitalization, patient could complete ADLs with min.  Patient will benefit from skilled intervention to decrease level of assist with basic self-care skills and increase independence with basic self-care skills prior to discharge home with care partner.  Anticipate patient will require minimal physical assistance and follow up home health.  OT - End of Session Activity Tolerance: Tolerates 30+ min activity with multiple rests Endurance Deficit: Yes OT Assessment Rehab Potential (ACUTE ONLY): Good OT Barriers to Discharge: Inaccessible home environment OT Barriers to Discharge  Comments: full baths on second floor as well as steps to get in from outside OT Patient demonstrates impairments in the following area(s): Balance;Cognition;Motor;Endurance;Safety OT Basic ADL's Functional Problem(s): Grooming;Bathing;Dressing;Toileting OT Transfers Functional Problem(s): Toilet;Tub/Shower OT Plan OT Intensity: Minimum of 1-2 x/day, 45 to 90 minutes OT Frequency: 5 out of 7 days OT Duration/Estimated Length of Stay: 17 days OT Treatment/Interventions: Medical illustrator training;Community reintegration;Discharge planning;Cognitive remediation/compensation;DME/adaptive equipment instruction;Neuromuscular re-education;Patient/family education;Therapeutic Exercise;UE/LE Coordination activities;Wheelchair propulsion/positioning;Self Care/advanced ADL retraining;Pain management;Functional mobility training;Therapeutic Activities;UE/LE Strength taining/ROM OT Self Feeding Anticipated Outcome(s): independent OT Basic Self-Care Anticipated Outcome(s): min assist OT Toileting Anticipated Outcome(s): min assist OT Bathroom Transfers Anticipated Outcome(s): min assist OT Recommendation Patient destination: Home Follow Up Recommendations: Home health OT;24 hour supervision/assistance Equipment Recommended: To be determined   Skilled Therapeutic Intervention Began selfcare retraining sit to stand during session.  Pt completed toilet transfer X2 during session with total assist and use of the stedy.  He needed total assist for all aspects of toileting as well.  He was able to complete sit to stand from the 3:1 and the wheelchair with min assist and mod demonstrational cueing for hand placement and sequencing.  Min assist with max instructional cueing for UB bathing with pt needing constant re-direction secondary to decreased sustained attention and ability to follow verbal directions.  He would attempt to squeeze out his gown instead of the washcloth secondary to confusion.  At times he would  also make statements that did not make sense.  He needed mod setup for orientation of his shirt as well as mod assist to pull it over his head.  Max assist for LB bathing with total assist for dressing.  Pt with decreased orientation during session as well.  He was unable to state the city or type of building he was in "hospital" but per his wife, he was able to state that he was at [facility] earlier.  Pt left up in tilt in space wheelchair at end of session with safety belt in place.    OT Evaluation Precautions/Restrictions  Precautions Precautions: Fall Precaution Comments: history of parkinson's Restrictions Weight Bearing Restrictions: No LLE Weight Bearing: Weight bearing as tolerated  Pain Pain Assessment Pain Scale: Faces Pain Score: 0-No pain Home Living/Prior Functioning Home Living Family/patient expects to be discharged to:: Private residence Living Arrangements: Spouse/significant other Available Help at Discharge: Family, Available PRN/intermittently Type of Home: House Home Access: Stairs to enter [employer] of Steps: 8-9 Entrance Stairs-Rails: Right Home Layout: Two level, Bed/bath upstairs, 1/2 bath on main level Alternate Level Stairs-Number of Steps: flight Bathroom Shower/Tub: Walk-in shower(on second level) Biochemist, clinical: Standard Additional Comments: Pt sleeps in lift chair; fall down bottom step in garage resulting in hip fracture  Lives With: Spouse, Family IADL History Homemaking Responsibilities: Yes Laundry Responsibility: Secondary Current License: No Occupation: Retired Prior Function Level of Independence: Needs assistance with ADLs Bath: Supervision/set-up  Able to Take Stairs?: Yes Driving: No 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 states that children live with him  Comments:   ADL ADL Eating: Supervision/safety Where Assessed-Eating: Wheelchair Grooming: Supervision/safety Where  Assessed-Grooming: Wheelchair Upper Body Bathing: Maximal assistance Where Assessed-Upper Body Bathing: Wheelchair Lower Body Bathing: Dependent Where Assessed-Lower Body Bathing: Wheelchair Upper Body Dressing: Maximal assistance Where Assessed-Upper Body Dressing: Wheelchair Lower Body  Dressing: Dependent Where Assessed-Lower Body Dressing: Wheelchair Toileting: Dependent Where Assessed-Toileting: Bedside Commode Toilet Transfer: Dependent(use of stedy) Toilet Transfer Method: Other (comment)(Stedy) Science writer: Bedside commode Vision Baseline Vision/History: Wears glasses Wears Glasses: At all times Patient Visual Report: No change from baseline Vision Assessment?: No apparent visual deficits Perception  Perception: Not tested Praxis Praxis: Intact Cognition Overall Cognitive Status: Impaired/Different from baseline Orientation Level: Person;Place;Situation Person: Oriented Place: Disoriented Situation: Oriented Year: 2020 Month: March Day of Week: Incorrect Memory: Impaired Memory Impairment: Storage deficit;Decreased short term memory Decreased Short Term Memory: Functional basic Immediate Memory Recall: Sock;Blue;Bed Memory Recall: Sock Memory Recall Sock: Without Cue Attention: Sustained Sustained Attention: Impaired Sustained Attention Impairment: Verbal basic;Functional basic Awareness: Impaired Awareness Impairment: Intellectual impairment Problem Solving: Impaired Problem Solving Impairment: Functional basic Behaviors: Impulsive Safety/Judgment: Impaired Comments: Pt attempted to stand without therapist beside of him assisting Sensation Sensation Light Touch: Appears Intact Hot/Cold: Appears Intact Proprioception: Appears Intact Stereognosis: Appears Intact Coordination Gross Motor Movements are Fluid and Coordinated: No Fine Motor Movements are Fluid and Coordinated: No Coordination and Movement Description: Pt with increased tremors  noted bilaterally during selfcare tasks.  Motor  Motor Motor: Abnormal postural alignment and control Motor - Skilled Clinical Observations: Pt with slow movements secondary to Parkinson's.  Demonstrates generalized weakness and confusion worse compared to PLOF. Mobility  Transfers Sit to Stand: Dependent - mechanical lift  Trunk/Postural Assessment  Cervical Assessment Cervical Assessment: Exceptions to WFL(forward head with lateral tilt to the right) Thoracic Assessment Thoracic Assessment: Exceptions to WFL(thoracic rounding) Lumbar Assessment Lumbar Assessment: Exceptions to WFL(lumbar flexion at rest) Postural Control Postural Control: Deficits on evaluation Postural Limitations: Pt with increased trunk flexion and hip flexion in standing.  Balance Balance Balance Assessed: Yes Static Sitting Balance Static Sitting - Balance Support: Feet supported Static Sitting - Level of Assistance: 5: Stand by assistance Dynamic Sitting Balance Dynamic Sitting - Balance Support: During functional activity;Feet supported Dynamic Sitting - Level of Assistance: 4: Min assist Static Standing Balance Static Standing - Balance Support: During functional activity Static Standing - Level of Assistance: 1: +1 Total assist Dynamic Standing Balance Dynamic Standing - Balance Support: During functional activity Dynamic Standing - Level of Assistance: 1: +1 Total assist Extremity/Trunk Assessment RUE Assessment RUE Assessment: Exceptions to [facility] General Strength Comments: AROM WFLs for all joints with strength 5/5 throughout.  Noted bilateral tremors in hands which spouse reports was not present PTA.. LUE Assessment LUE Assessment: Exceptions to [facility] General Strength Comments: AROM WFLs for all joints with strength 5/5 throughout.  Noted bilateral tremors in hands which spouse reports was not present PTA.     Refer to Care Plan for Long Term Goals  Recommendations for other services: None     Discharge Criteria: Patient will be discharged from OT if patient refuses treatment 3 consecutive times without medical reason, if treatment goals not met, if there is a change in medical status, if patient makes no progress towards goals or if patient is discharged from hospital.  The above assessment, treatment plan, treatment alternatives and goals were discussed and mutually agreed upon: by patient and by family  Tasman Zapata OTR/L 12/12/2018, 1:28 PM

## 2018-12-12 NOTE — Progress Notes (Signed)
Occupational Therapy Session Note  Patient Details  Name: [name] [name] MRN: [id number] Date of Birth: Nov 19, 1950  Today's Date: 12/12/2018 OT Individual Time: 7741-2878 OT Individual Time Calculation (min): 45 min  and Today's Date: 12/12/2018 OT Missed Time: 15 Minutes Missed Time Reason: Patient fatigue   Short Term Goals: Week 1:  OT Short Term Goal 1 (Week 1): Pt will complete LB bathing with mod assist sit to stand. OT Short Term Goal 2 (Week 1): Pt will complete LB dressing with mod assist using AE PRN. OT Short Term Goal 3 (Week 1): Pt will complete UB dressing with min assist 2 consecutive sessions. OT Short Term Goal 4 (Week 1): Pt will perform stand pivot transfer to the 3:1 with mod assist using the RW for support.   Skilled Therapeutic Interventions/Progress Updates:  Treatment session with focus on sit > stand, upright standing posture, and weight shifting.  Pt received upright in w/c agreeable to therapy session.  Pt completed sit > stand in [place] with min fade to mod assist due to fatigue. Pt required increased time (due to Parkinson's) for initiation of each movement. Pt able to complete tasks with less assist when given increased time to initiate.  Engaged in reaching activity while standing in [place] with focus on anterior weight shift and releasing UE support on [place].  Pt reports need to toilet.  Returned to room and transferred to [facility] over toilet with increased time to initiate lowering to toilet.  Utilized +2 for safety due to increased processing time.  Completed sit > stand to RW from [facility] with Max assist +2 to facilitate anterior weight shift.  Pt demonstrating difficulty moving hands from grab bar to RW handles.  Returned to bed via [place] with +2 to return to supine in bed.    Pt's wife present at end of session and requesting rest breaks between sessions, as pt would typically nap in morning and afternoon.    Therapy Documentation Precautions:   Precautions Precautions: Fall Precaution Comments: history of parkinson's Restrictions Weight Bearing Restrictions: No LLE Weight Bearing: Weight bearing as tolerated General: General OT Amount of Missed Time: 15 Minutes Vital Signs: Therapy Vitals Temp: 98.1 F (36.7 C) Pulse Rate: 80 Resp: 17 BP: (!) 123/98 Patient Position (if appropriate): Lying Oxygen Therapy SpO2: 95 % O2 Device: Room Air Pain:  Pt with no c/o pain   Therapy/Group: Individual Therapy  Simonne Come 12/12/2018, 3:49 PM

## 2018-12-12 NOTE — Progress Notes (Signed)
[place] PHYSICAL MEDICINE & REHABILITATION PROGRESS NOTE  Subjective/Complaints: Patient seen lying in bed this morning.  He states he slept well overnight.  He states he is not doing well because he wants to go home.  ROS: Denies CP, shortness of breath, nausea, vomiting, diarrhea.  Objective: Vital Signs: Blood pressure (!) 154/74, pulse 77, SpO2 98 %. No results found. Recent Labs    12/11/18 0313 12/12/18 0507  WBC 10.5 11.7*  HGB 9.3* 9.8*  HCT 27.7* 28.0*  PLT 181 216   Recent Labs    12/11/18 0313 12/12/18 0507  NA 137 136  K 3.8 3.7  CL 105 107  CO2 23 20*  GLUCOSE 129* 146*  BUN 19 16  CREATININE 1.10 1.07  CALCIUM 8.5* 8.4*    Physical Exam: BP (!) 154/74 (BP Location: Left Arm)   Pulse 77   SpO2 98%  Constitutional: No distress . Vital signs reviewed. HENT: Normocephalic.  Atraumatic. Eyes: EOMI. No discharge. Cardiovascular: RRR. No JVD.  + Murmur. Respiratory: CTA Bilaterally. Normal effort. GI: BS +.  Distended. Musculoskeletal: Edema and tenderness in left hip  Neurological:Alert and oriented x3, except for date of month Patient is alert.  Makes good eye contact Masked facies.  No rigidity appreciated  Dysarthria.  Motor: Bilateral UE 4+-5 proximal to distal  LLE: Limited by pain, hip flexion, knee extension 2-/5, ankle dorsiflexion 3/5  RLE: 4/5 proximal to distal Skin: Left hip with dressing C/C/I Psychiatric: He has anormal mood and affect. Flat, sometimes anxious  Assessment/Plan: 1. Functional deficits secondary to left hip fracture with history of Parkinson's disease which require 3+ hours per day of interdisciplinary therapy in a comprehensive inpatient rehab setting.  Physiatrist is providing close team supervision and 24 hour management of active medical problems listed below.  Physiatrist and rehab team continue to assess barriers to discharge/monitor patient progress toward functional and medical goals  Care  Tool:  Bathing              Bathing assist       Upper Body Dressing/Undressing Upper body dressing        Upper body assist      Lower Body Dressing/Undressing Lower body dressing            Lower body assist       Toileting Toileting Toileting Activity did not occur (Clothing management and hygiene only): Safety/medical concerns  Toileting assist       Transfers Chair/bed transfer  Transfers assist  Chair/bed transfer activity did not occur: Safety/medical concerns  Chair/bed transfer assist level: 2 Helpers     Locomotion Ambulation   Ambulation assist              Walk 10 feet activity   Assist           Walk 50 feet activity   Assist           Walk 150 feet activity   Assist           Walk 10 feet on uneven surface  activity   Assist           Wheelchair     Assist               Wheelchair 50 feet with 2 turns activity    Assist            Wheelchair 150 feet activity     Assist  Medical Problem List and Plan: 1.Decreased functional mobilitysecondary to Left femoral neck fracture. 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 Weightbearing as tolerated  Begin CIR  Hip precautions added  Notes reviewed-hip fracture status post fall, labs reviewed 2. Antithrombotics: -DVT/anticoagulation:Subcutaneous Lovenox. vascular study pending -antiplatelet therapy: N/A 3. Pain Management:Robaxin and oxycodone as needed 4. Mood:Wellbutrin 150 mg daily and 300 mg daily at bedtime  Celexa 40 mg daily, decreased to 20 mg on 3/11 due with prolonged QT  Provide emotional support -antipsychotic agents: see above 5. Neuropsych: This patientis?fully capable of making decisions on hisown behalf. 6. Skin/Wound Care:Routine skin checks 7. Fluids/Electrolytes/Nutrition:Routine in and out's   BMP within acceptable  range on 3/11 8. Acute blood loss anemia.   Hemoglobin 9.8 on 3/11  Continue to monitor 9. Parkinson disease. Sinemet 25-100 1-2 tablets 3 times a day,Comtan 200 mg 3 times a day,Requip 1 mg 3 times a day, 10. Chronic idiopathic constipation. Linzess290 mcg daily 11. Overactive bladder. Toviaz 4 mg daily 12.  Hyperglycemia  Monitor with increased mobility 13.  Leukocytosis  WBCs 11.7 on 3/11  Afebrile  Continue to monitor 14.  Hypoalbuminemia  Supplement initiated on 3/11  LOS: 1 days A FACE TO FACE EVALUATION WAS PERFORMED  Ankit Lorie Phenix 12/12/2018, 8:48 AM

## 2018-12-12 NOTE — Evaluation (Signed)
Physical Therapy Assessment and Plan  Patient Details  Name: [name] [name] MRN: [id number] Date of Birth: [birth year]  PT Diagnosis: Abnormal posture, Abnormality of gait, Impaired cognition, Muscle weakness and Pain in joint Rehab Potential: Good ELOS: 16-21 days     Today's Date: 12/12/2018 PT Individual Time: 0800-0930 PT Individual Time Calculation (min): 90 min    Problem List:  Patient Active Problem List   Diagnosis Date Noted  . Acute blood loss anemia   . Leukocytosis   . Episode of recurrent major depressive disorder ([place])   . Hypoalbuminemia due to protein-calorie malnutrition ([place])   . Left displaced femoral neck fracture ([place]) 12/11/2018  . Closed fracture of neck of left femur ([place])   . Closed left hip fracture ([place]) 12/08/2018  . Left foot pain 07/26/2018  . TMJ (temporomandibular joint syndrome) 10/10/2017  . Parkinson's disease ([place]) 03/20/2017  . Bilateral lower extremity edema 03/20/2017  . Chronic venous insufficiency 09/13/2016  . Fatigue 03/15/2016  . Parkinsonian features 09/15/2015  . ED (erectile dysfunction) 09/15/2015  . MDD (major depressive disorder), recurrent episode, severe ([place]) 02/10/2014  . Nonspecific abnormal electrocardiogram (ECG) (EKG) 02/07/2014  . Non-compliant behavior 02/03/2014  . Morton's neuroma of left foot 08/07/2013  . Attention deficit disorder without mention of hyperactivity 03/13/2012  . Hyperglycemia 01/26/2012  . Hypogonadism male 01/24/2012  . CHEST TIGHTNESS 02/18/2008    Past Medical History:  Past Medical History:  Diagnosis Date  . APPENDECTOMY, HX OF 02/18/2008   Qualifier: Diagnosis of  By: Danny Lawless CMA, [place]    . Cardiac murmur    as a child  . Chronic idiopathic constipation   . Depression   . Headache(784.0)   . HEADACHES, HX OF 02/18/2008   Qualifier: Diagnosis of  By: Danny Lawless CMA, [place]    . Parkinson disease ([place]) 11/2014  . Sinus mucosal thickening    pt unable to lay flat  . Streptococcal  meningitis    as an infant   Past Surgical History:  Past Surgical History:  Procedure Laterality Date  . APPENDECTOMY  1967  . COLONOSCOPY    . NASAL SINUS SURGERY     x 4 as a child  . TOTAL HIP ARTHROPLASTY Left 12/09/2018   Procedure: TOTAL HIP ARTHROPLASTY ANTERIOR APPROACH;  Surgeon: Leandrew Koyanagi, MD;  Location: [place];  Service: Orthopedics;  Laterality: Left;  Marland Kitchen VASECTOMY      Assessment & Plan Clinical Impression: Patient is a 68 year old right-handed male with history of Parkinson's disease followed by neurology services Dr. [name] Tat maintained on Sinemet 25-100 ,Comtan 200 mg 3 times a day, depression maintained on Wellbutrin 150 mg every morning and 300 mg every evening Celexa 40 mg daily, chronic idiopathic constipation. Per chart review patient lives with supportive wife. Modified independent using a straight point cane occasionally. Two-level home with bedroom bath upstairs 8 steps to entry. Wife does assist with some basic ADLs. Presented 12/08/2018 after a fall while walking down into his garage. He states he missed the last step. He did not hit his head or black out. Patient noted significant left hip pain. X-rays and imaging revealed left femoral neck fracture. Underwent total hip replacement uncemented 12/09/2018 per Dr.Xu with anterior approach. Hospital course pain management and weightbearing as tolerated left lower extremity.  Patient transferred to CIR on 12/11/2018 .   Patient currently requires total with mobility secondary to muscle weakness and muscle joint tightness, unbalanced muscle activation, motor apraxia and decreased motor planning, decreased attention to  left, decreased attention, decreased awareness, decreased problem solving, decreased safety awareness and delayed processing and decreased sitting balance, decreased standing balance, decreased balance strategies and difficulty maintaining precautions.  Prior to hospitalization, patient was modified  independent  with mobility and lived with Spouse, Family in a House home.  Home access is 8-9Stairs to enter.  Patient will benefit from skilled PT intervention to maximize safe functional mobility, minimize fall risk and decrease caregiver burden for planned discharge home with 24 hour assist.  Anticipate patient will benefit from follow up [facility] at discharge.  PT - End of Session Activity Tolerance: Tolerates < 10 min activity, no significant change in vital signs Endurance Deficit: Yes PT Assessment Rehab Potential (ACUTE/IP ONLY): Good PT Barriers to Discharge: [place] home environment;Home environment access/layout;Behavior PT Patient demonstrates impairments in the following area(s): Balance;Behavior;Edema;Endurance;Motor;Perception;Pain;Safety;Skin Integrity PT Transfers Functional Problem(s): Bed Mobility;Bed to Chair;Car;Furniture;Floor PT Locomotion Functional Problem(s): Ambulation;Wheelchair Mobility;Stairs PT Plan PT Intensity: Minimum of 1-2 x/day ,45 to 90 minutes PT Frequency: 5 out of 7 days PT Duration Estimated Length of Stay: 16-21 days   PT Treatment/Interventions: Ambulation/gait training;Balance/vestibular training;Cognitive remediation/compensation;Disease management/prevention;Discharge planning;DME/adaptive equipment instruction;Functional electrical stimulation;Functional mobility training;Neuromuscular re-education;Patient/family education;Psychosocial support;Pain management;Skin care/wound management;Splinting/orthotics;Stair training;Therapeutic Activities;Therapeutic Exercise;UE/LE Strength taining/ROM;UE/LE Coordination activities;Wheelchair propulsion/positioning;Community reintegration;Visual/perceptual remediation/compensation PT Transfers Anticipated Outcome(s): Min assist with LRAD  PT Locomotion Anticipated Outcome(s): ambulatory Min assist with LRAd  PT Recommendation Follow Up Recommendations: Home health PT Patient destination: Home Equipment  Recommended: Wheelchair cushion (measurements);Wheelchair (measurements);Rolling walker with 5" wheels  Skilled Therapeutic Intervention PT instructed patient in PT Evaluation and initiated treatment intervention; see below for results. PT educated patient in [place], rehab potential, rehab goals, and discharge recommendations. Pt unable to perform sit<>stand without AD from EOB due to severe rigidity and fear of falling. stedy transfer to [facility] as listed below. Pt left sitting in WC with call bell in reach and all needs met.         PT Evaluation Precautions/Restrictions Precautions Precautions: Fall Precaution Comments: history of parkinson's Restrictions Weight Bearing Restrictions: No LLE Weight Bearing: Weight bearing as tolerated General   Vital Signs Pain Pain Assessment Pain Scale: Faces Pain Score: 0-No pain Home Living/Prior Functioning Home Living Available Help at Discharge: Family;Available PRN/intermittently Type of Home: House Home Access: Stairs to enter [employer] of Steps: 8-9 Home Layout: Two level;Bed/bath upstairs;1/2 bath on main level Bathroom Shower/Tub: Walk-in shower(on second level) Biochemist, clinical: Standard Additional Comments: Pt sleeps in lift chair; fall down bottom step in garage resulting in hip fracture  Lives With: Spouse;Family Prior Function Level of Independence: Needs assistance with ADLs Bath: Supervision/set-up  Able to Take Stairs?: Yes Driving: No 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 states that children live with him  Vision/Perception  Perception Perception: Not tested Praxis Praxis: Intact  Cognition Overall Cognitive Status: Impaired/Different from baseline Attention: Sustained Sustained Attention: Impaired Sustained Attention Impairment: Verbal basic;Functional basic Memory: Impaired Memory Impairment: Storage deficit;Decreased short term memory Decreased Short Term Memory: Functional  basic Awareness: Impaired Awareness Impairment: Intellectual impairment Problem Solving: Impaired Problem Solving Impairment: Functional basic Behaviors: Impulsive Safety/Judgment: Impaired Comments: Pt attempted to stand without therapist beside of him assisting Sensation Sensation Light Touch: Appears Intact Hot/Cold: Appears Intact Proprioception: Appears Intact Stereognosis: Appears Intact Coordination Gross Motor Movements are Fluid and Coordinated: No Fine Motor Movements are Fluid and Coordinated: No Coordination and Movement Description: Pt with increased tremors noted bilaterally L>R Motor  Motor Motor: Abnormal postural alignment and control Motor - Skilled Clinical Observations: Pt with  slow movements secondary to Parkinson's.  Demonstrates generalized weakness and confusion worse compared to PLOF.  Mobility Bed Mobility Bed Mobility: Rolling Right;Rolling Left;Supine to Sit Rolling Right: Maximal Assistance - Patient 25-49% Rolling Left: Total Assistance - Patient < 25% Supine to Sit: Total Assistance - Patient < 25% Transfers Transfers: Sit to [employer] via Geophysicist/field seismologist Sit to Stand: Dependent - Administrator, Civil Service via Lift Equipment: Probation officer Ambulation: No Gait Gait: No Stairs / Additional Locomotion Stairs: No Architect: Yes Wheelchair Assistance: Dependent - Patient 0% Environmental health practitioner: Other (comment)(TIS WC ) Distance: 164f   Trunk/Postural Assessment  Cervical Assessment Cervical Assessment: Exceptions to WFL(forward head with lateral tilt to the right) Thoracic Assessment Thoracic Assessment: Exceptions to WFL(thoracic rounding) Lumbar Assessment Lumbar Assessment: Exceptions to WFL(lumbar flexion at rest) Postural Control Postural Control: Deficits on evaluation Postural Limitations: Pt with increased trunk flexion and hip flexion in standing.  Balance Balance Balance Assessed:  Yes Static Sitting Balance Static Sitting - Balance Support: Feet supported Static Sitting - Level of Assistance: 5: Stand by assistance Dynamic Sitting Balance Dynamic Sitting - Balance Support: During functional activity;Feet supported Dynamic Sitting - Level of Assistance: 4: Min assist Static Standing Balance Static Standing - Balance Support: During functional activity Static Standing - Level of Assistance: 1: +1 Total assist Dynamic Standing Balance Dynamic Standing - Balance Support: During functional activity Dynamic Standing - Level of Assistance: 1: +1 Total assist Extremity Assessment  RUE Assessment RUE Assessment: Exceptions to WBoston Children'SGeneral Strength Comments: AROM WFLs for all joints with strength 5/5 throughout.  Noted bilateral tremors in hands which spouse reports was not present PTA.. LUE Assessment LUE Assessment: Exceptions to [facility] Center[facility] Strength Comments: AROM WFLs for all joints with strength 5/5 throughout.  Noted bilateral tremors in hands which spouse reports was not present PTA. RLE Assessment RLE Assessment: Within Functional Limits General Strength Comments: slow movements.  LLE Assessment General Strength Comments: 4/5 with mild hip pain. very rigid.     Refer to Care Plan for Long Term Goals  Recommendations for other services: None   Discharge Criteria: Patient will be discharged from PT if patient refuses treatment 3 consecutive times without medical reason, if treatment goals not met, if there is a change in medical status, if patient makes no progress towards goals or if patient is discharged from hospital.  The above assessment, treatment plan, treatment alternatives and goals were discussed and mutually agreed upon: by patient  ALorie Phenix3/08/2019, 2:28 PM

## 2018-12-12 NOTE — Progress Notes (Signed)
Lower extremity venous has been completed.   Preliminary results in CV Proc.   Abram Sander 12/12/2018 3:34 PM

## 2018-12-12 NOTE — Progress Notes (Signed)
Per nursing, patient was given "Data Collection Information Summary for Patients in Inpatient Rehabilitation Facilities with attached Privacy Act Statement Health Care Records" upon admission.    Patient information reviewed and entered into eRehab System by Becky Hassan Blackshire, PPS coordinator. Information including medical coding, function ability, and quality indicators will be reviewed and updated through discharge.   

## 2018-12-12 NOTE — Telephone Encounter (Signed)
Pt was on TCM report admitted 12/08/18 for mechanical fall. Pt injured hip, and xray showed Left hip femoral neck fracture. Pt underwent a left total hip arthroplasty by Dr.Xu3/8. Pt will continue Levenox injection for next 3-4 wks, and was D/C 12/11/18 to CIR for rehabilitation. Will follow-up w/surgeon in 2 weeks.Marland KitchenJohny Chess

## 2018-12-13 ENCOUNTER — Inpatient Hospital Stay (HOSPITAL_COMMUNITY): Payer: Self-pay | Admitting: Occupational Therapy

## 2018-12-13 ENCOUNTER — Inpatient Hospital Stay (HOSPITAL_COMMUNITY): Payer: Medicare Other

## 2018-12-13 ENCOUNTER — Inpatient Hospital Stay (HOSPITAL_COMMUNITY): Payer: Self-pay | Admitting: Physical Therapy

## 2018-12-13 DIAGNOSIS — R1310 Dysphagia, unspecified: Secondary | ICD-10-CM

## 2018-12-13 DIAGNOSIS — G218 Other secondary parkinsonism: Secondary | ICD-10-CM

## 2018-12-13 DIAGNOSIS — R410 Disorientation, unspecified: Secondary | ICD-10-CM

## 2018-12-13 DIAGNOSIS — R0989 Other specified symptoms and signs involving the circulatory and respiratory systems: Secondary | ICD-10-CM

## 2018-12-13 DIAGNOSIS — R4182 Altered mental status, unspecified: Secondary | ICD-10-CM

## 2018-12-13 LAB — URINALYSIS, ROUTINE W REFLEX MICROSCOPIC
Bilirubin Urine: NEGATIVE
Glucose, UA: NEGATIVE mg/dL
Ketones, ur: 5 mg/dL — AB
Leukocytes,Ua: NEGATIVE
Nitrite: NEGATIVE
Protein, ur: 30 mg/dL — AB
Specific Gravity, Urine: 1.029 (ref 1.005–1.030)
pH: 5 (ref 5.0–8.0)

## 2018-12-13 LAB — CBC
HCT: 27.9 % — ABNORMAL LOW (ref 39.0–52.0)
Hemoglobin: 9.8 g/dL — ABNORMAL LOW (ref 13.0–17.0)
MCH: 31.5 pg (ref 26.0–34.0)
MCHC: 35.1 g/dL (ref 30.0–36.0)
MCV: 89.7 fL (ref 80.0–100.0)
NRBC: 0 % (ref 0.0–0.2)
Platelets: 274 10*3/uL (ref 150–400)
RBC: 3.11 MIL/uL — ABNORMAL LOW (ref 4.22–5.81)
RDW: 12.7 % (ref 11.5–15.5)
WBC: 15.2 10*3/uL — ABNORMAL HIGH (ref 4.0–10.5)

## 2018-12-13 LAB — BASIC METABOLIC PANEL
Anion gap: 10 (ref 5–15)
BUN: 24 mg/dL — ABNORMAL HIGH (ref 8–23)
CO2: 22 mmol/L (ref 22–32)
Calcium: 8.5 mg/dL — ABNORMAL LOW (ref 8.9–10.3)
Chloride: 105 mmol/L (ref 98–111)
Creatinine, Ser: 1.13 mg/dL (ref 0.61–1.24)
GFR calc Af Amer: >60 mL/min (ref >60)
GFR calc non Af Amer: >60 mL/min (ref >60)
Glucose, Bld: 118 mg/dL — ABNORMAL HIGH (ref 70–99)
Potassium: 3.7 mmol/L (ref 3.5–5.1)
SODIUM: 137 mmol/L (ref 135–145)

## 2018-12-13 LAB — LACTIC ACID, PLASMA
Lactic Acid, Venous: 1.8 mmol/L (ref 0.5–1.9)
Lactic Acid, Venous: 0.9 mmol/L (ref 0.5–1.9)

## 2018-12-13 LAB — AMMONIA: Ammonia: 25 umol/L (ref 9–35)

## 2018-12-13 MED ORDER — LORAZEPAM 0.5 MG PO TABS
1.0000 mg | ORAL_TABLET | Freq: Once | ORAL | Status: AC
  Administered 2018-12-13: 1 mg via ORAL
  Filled 2018-12-13: qty 2

## 2018-12-13 NOTE — Progress Notes (Signed)
Occupational Therapy Session Note  Patient Details  Name: [name] [name] MRN: [id number] Date of Birth: 02/02/1951  Today's Date: 12/13/2018 OT Individual Time: 0233-4356 OT Individual Time Calculation (min): 11 min    Short Term Goals: Week 1:  OT Short Term Goal 1 (Week 1): Pt will complete LB bathing with mod assist sit to stand. OT Short Term Goal 2 (Week 1): Pt will complete LB dressing with mod assist using AE PRN. OT Short Term Goal 3 (Week 1): Pt will complete UB dressing with min assist 2 consecutive sessions. OT Short Term Goal 4 (Week 1): Pt will perform stand pivot transfer to the 3:1 with mod assist using the RW for support.   Skilled Therapeutic Interventions/Progress Updates:    Pt missed 34 mins of OT session secondary to delirium and receiving Ativan earlier.  Pt still noted with tremors throughout as well as occasional reaching up into the air for objects not present.  Discussed current situation with pt's spouse and helped provide reassurance that pt will be able to participate and improve with ADL function once cognition clears.  Discussed impending tests that MD has ordered that might help clarify current situation.  She thanked therapist for discussion and pt was left in the bed resting with bed alarm in place.  Will re-attempt therapy in the am as allowed by MD.    Therapy Documentation Precautions:  Precautions Precautions: Fall Precaution Comments: history of parkinson's Restrictions Weight Bearing Restrictions: No LLE Weight Bearing: Weight bearing as tolerated General: General OT Amount of Missed Time: 34 Minutes  Pain: Pain Assessment Pain Scale: Faces Pain Score: 0-No pain Pain Orientation: Lower Pain Descriptors / Indicators: Discomfort Pain Intervention(s): Repositioned;Emotional support  Therapy/Group: Individual Therapy  Anayah Arvanitis OTR/L 12/13/2018, 3:30 PM

## 2018-12-13 NOTE — Evaluation (Addendum)
Speech Language Pathology Assessment and Plan  Patient Details  Name: [name] [name] MRN: [id number] Date of Birth: 11-03-1950  SLP Diagnosis: Cognitive Impairments(secondary to possible delirium)  Rehab Potential: Good ELOS: 16-21 days     Today's Date: 12/13/2018 SLP Individual Time: 1311-1340 SLP Individual Time Calculation (min): 29 min   Problem List:  Patient Active Problem List   Diagnosis Date Noted  . AMS (altered mental status)   . Labile blood pressure   . Dysphagia   . Acute blood loss anemia   . Leukocytosis   . Episode of recurrent major depressive disorder ([place])   . Hypoalbuminemia due to protein-calorie malnutrition ([place])   . Left displaced femoral neck fracture ([place]) 12/11/2018  . Closed fracture of neck of left femur ([place])   . Closed left hip fracture ([place]) 12/08/2018  . Left foot pain 07/26/2018  . TMJ (temporomandibular joint syndrome) 10/10/2017  . Parkinson's disease ([place]) 03/20/2017  . Bilateral lower extremity edema 03/20/2017  . Chronic venous insufficiency 09/13/2016  . Fatigue 03/15/2016  . Parkinsonian features 09/15/2015  . ED (erectile dysfunction) 09/15/2015  . MDD (major depressive disorder), recurrent episode, severe ([place]) 02/10/2014  . Nonspecific abnormal electrocardiogram (ECG) (EKG) 02/07/2014  . Non-compliant behavior 02/03/2014  . Morton's neuroma of left foot 08/07/2013  . Attention deficit disorder without mention of hyperactivity 03/13/2012  . Hyperglycemia 01/26/2012  . Hypogonadism male 01/24/2012  . CHEST TIGHTNESS 02/18/2008   Past Medical History:  Past Medical History:  Diagnosis Date  . APPENDECTOMY, HX OF 02/18/2008   Qualifier: Diagnosis of  By: Danny Lawless CMA, [place]    . Cardiac murmur    as a child  . Chronic idiopathic constipation   . Depression   . Headache(784.0)   . HEADACHES, HX OF 02/18/2008   Qualifier: Diagnosis of  By: Danny Lawless CMA, [place]    . Parkinson disease ([place]) 11/2014  . Sinus mucosal  thickening    pt unable to lay flat  . Streptococcal meningitis    as an infant   Past Surgical History:  Past Surgical History:  Procedure Laterality Date  . APPENDECTOMY  1967  . COLONOSCOPY    . NASAL SINUS SURGERY     x 4 as a child  . TOTAL HIP ARTHROPLASTY Left 12/09/2018   Procedure: TOTAL HIP ARTHROPLASTY ANTERIOR APPROACH;  Surgeon: Leandrew Koyanagi, MD;  Location: [facility];  Service: Orthopedics;  Laterality: Left;  Marland Kitchen VASECTOMY      Assessment / Plan / Recommendation Clinical Impression   [name] [name] is a 68 year old right-handed male with history of Parkinson's disease followed by neurology services Dr. [name] Tat maintained on Sinemet 25-100 ,Comtan 200 mg 3 times a day, depression maintained on Wellbutrin 150 mg every morning and 300 mg every evening Celexa 40 mg daily, chronic idiopathic constipation. Per chart review patient lives with supportive wife. Modified independent using a straight point cane occasionally. Two-level home with bedroom bath upstairs 8 steps to entry. Wife does assist with some basic ADLs. Presented 12/08/2018 after a fall while walking down into his garage. He states he missed the last step. He did not hit his head or black out. Patient noted significant left hip pain. X-rays and imaging revealed left femoral neck fracture. Underwent total hip replacement uncemented 12/09/2018 per Dr.Xu with anterior approach. Hospital course pain management and weightbearing as tolerated left lower extremity. Acute blood loss anemia 9.3. subcutaneous Lovenox for DVT prophylaxis. Therapy evaluations completed with recommendations of physical medicine rehabilitation consult. Patient  was admitted for a comprehensive rehabilitation program.  SLP evaluation was completed on 12/13/2018 after concerns from PT/OT regarding pt's cognition.  Results are as follows: Pt presents with severe cognitive deficits which appear consistent with an acute delirium brought on by post procedure  anesthesia effects, medication changes, disrupted sleep-wake cycle, and possible UTI.  Pt is oriented to self only with active hallucinations during assessment.  Pt's wife also endorses behavioral changes including agitation post surgery.  Pt was given Ativan ~1 hr prior to therapist's arrival and was very lethargic and was only able to maintain alertness and focus his attention to stimulation for brief intervals of time.  SLP provided skilled education regarding behavioral and environmental strategies to re-regulate sleep wake cycle and minimize disruptive behaviors while maximizing pt's periods of engagement and wakefulness.  All questions were answered to wife's satisfaction at this time.  Pt would benefit from ST while inpatient in order to maximize functional independence and reduce burden of care prior to discharge.  Anticipate that pt will need 24/7 supervision at discharge in addition to [place] follow up at next level of care.    Of note, received orders for bedside swallow evaluation.  Pt is currently on dys 3, thin liquids due to oral holding of meats (per wife's report).  This is not typical for pt and SLP suspects this is secondary to acute cognitive dysfunction.  Deferred formal swallow evaluation on this date due to lethargy impacting safety but SLP will complete as is appropriate.     Skilled Therapeutic Interventions          Cognitive-linguistic evaluation completed with results and recommendations reviewed with family.     SLP Assessment  Patient will need skilled [place] [facility] Services during CIR admission    Recommendations  Recommendations for Other Services: Neuropsych consult Patient destination: Home Follow up Recommendations: Home Health SLP;Outpatient SLP;24 hour supervision/assistance Equipment Recommended: None recommended by SLP    SLP Frequency 3 to 5 out of 7 days   SLP Duration  SLP Intensity  SLP Treatment/Interventions 16-21 days   Minumum of 1-2  x/day, 30 to 90 minutes  Cognitive remediation/compensation;Environmental controls;Cueing hierarchy;Patient/family education;Internal/external aids    Pain Pain Assessment Pain Scale: 0-10 Pain Score: 0-No pain Pain Orientation: Lower Pain Descriptors / Indicators: Discomfort Pain Intervention(s): Repositioned;Emotional support  Prior Functioning Cognitive/Linguistic Baseline: Within functional limits Type of Home: House  Lives With: Spouse;Family Available Help at Discharge: Family;Available PRN/intermittently Vocation: Retired  Industrial/product designer Term Goals: Week 1: SLP Short Term Goal 1 (Week 1): Pt will utilize external aids to orient to place, date, and situation with max assist multimodal cues.   SLP Short Term Goal 2 (Week 1): Pt will sustain his attention to basic, familiar tasks for 2 minute intervals with max cues for redirection.   SLP Short Term Goal 3 (Week 1): Pt will recall basic, daily information with max assist for use of memory compensatory aids.    Refer to Care Plan for Long Term Goals  Recommendations for other services: Neuropsych  Discharge Criteria: Patient will be discharged from SLP if patient refuses treatment 3 consecutive times without medical reason, if treatment goals not met, if there is a change in medical status, if patient makes no progress towards goals or if patient is discharged from hospital.  The above assessment, treatment plan, treatment alternatives and goals were discussed and mutually agreed upon: by family  Trase Bunda, Selinda Orion 12/13/2018, 1:58 PM

## 2018-12-13 NOTE — Progress Notes (Signed)
[facility] PHYSICAL MEDICINE & REHABILITATION PROGRESS NOTE  Subjective/Complaints: Patient seen laying in bed this morning.  He states he slept well overnight.  He states he does not feel well this morning.  AMS worsening per wife.  ROS: Limited due to cognition.  Objective: Vital Signs: Blood pressure 132/61, pulse 83, temperature (P) 98.7 F (37.1 C), resp. rate 18, SpO2 91 %. Vas [place] Lower Extremity Venous (dvt)  Result Date: 12/12/2018  Lower Venous Study Indications: Swelling.  Limitations: Body habitus and patient positioning. Performing Technologist: Abram Sander RVS  Examination Guidelines: A complete evaluation includes B-mode imaging, spectral Doppler, color Doppler, and power Doppler as needed of all accessible portions of each vessel. Bilateral testing is considered an integral part of a complete examination. Limited examinations for reoccurring indications may be performed as noted.  Right Venous Findings: +---------+---------------+---------+-----------+----------+------------------+          CompressibilityPhasicitySpontaneityPropertiesSummary            +---------+---------------+---------+-----------+----------+------------------+ CFV      Full           Yes      Yes                                     +---------+---------------+---------+-----------+----------+------------------+ SFJ      Full                                                            +---------+---------------+---------+-----------+----------+------------------+ FV Prox  Full                                                            +---------+---------------+---------+-----------+----------+------------------+ FV Mid   Full                                                            +---------+---------------+---------+-----------+----------+------------------+ FV DistalFull                                                             +---------+---------------+---------+-----------+----------+------------------+ PFV      Full                                                            +---------+---------------+---------+-----------+----------+------------------+ POP                     Yes      Yes                  limited  visualization      +---------+---------------+---------+-----------+----------+------------------+ PTV      Full                                                            +---------+---------------+---------+-----------+----------+------------------+ PERO     Full                                                            +---------+---------------+---------+-----------+----------+------------------+  Left Venous Findings: +---------+---------------+---------+-----------+----------+------------------+          CompressibilityPhasicitySpontaneityPropertiesSummary            +---------+---------------+---------+-----------+----------+------------------+ CFV      Full           Yes      Yes                                     +---------+---------------+---------+-----------+----------+------------------+ SFJ      Full                                                            +---------+---------------+---------+-----------+----------+------------------+ FV Prox  Full                                                            +---------+---------------+---------+-----------+----------+------------------+ FV Mid   Full                                                            +---------+---------------+---------+-----------+----------+------------------+ FV DistalFull                                                            +---------+---------------+---------+-----------+----------+------------------+ PFV      Full                                                             +---------+---------------+---------+-----------+----------+------------------+ POP      Full           Yes      Yes                  limited  visualization      +---------+---------------+---------+-----------+----------+------------------+ PTV      Full                                                            +---------+---------------+---------+-----------+----------+------------------+ PERO                                                  Not visualized     +---------+---------------+---------+-----------+----------+------------------+    Summary: Right: There is no evidence of deep vein thrombosis in the lower extremity. However, portions of this examination were limited- see technologist comments above. No cystic structure found in the popliteal fossa. Left: There is no evidence of deep vein thrombosis in the lower extremity. However, portions of this examination were limited- see technologist comments above. No cystic structure found in the popliteal fossa.  *See table(s) above for measurements and observations. Electronically signed by Harold Barban MD on 12/12/2018 at 6:49:32 PM.    Final    Recent Labs    12/12/18 0507 12/13/18 1014  WBC 11.7* 15.2*  HGB 9.8* 9.8*  HCT 28.0* 27.9*  PLT 216 274   Recent Labs    12/11/18 0313 12/12/18 0507  NA 137 136  K 3.8 3.7  CL 105 107  CO2 23 20*  GLUCOSE 129* 146*  BUN 19 16  CREATININE 1.10 1.07  CALCIUM 8.5* 8.4*    Physical Exam: BP 132/61 (BP Location: Left Arm)   Pulse 83   Temp (P) 98.7 F (37.1 C)   Resp 18   SpO2 91%  Constitutional: No distress . Vital signs reviewed. HENT: Normocephalic.  Atraumatic. Eyes: EOMI. No discharge. Cardiovascular: RRR.  No JVD.  + Murmur. Respiratory: CTA bilaterally.  Normal effort. GI: BS +.  Distended. Musculoskeletal: Edema and tenderness in left hip  Neurological:Alert Patient is alert.  Makes  good eye contact Masked facies.  Sporadic jerking movements noted Dysarthria.  Motor: Bilateral UE 4+-5 proximal to distal  LLE: Limited by pain, hip flexion, knee extension 2-/5, ankle dorsiflexion 3/5  RLE: 4/5 proximal to distal Skin: Left hip with dressing C/C/I Psychiatric: Confused, tangential  Assessment/Plan: 1. Functional deficits secondary to left hip fracture with history of Parkinson's disease which require 3+ hours per day of interdisciplinary therapy in a comprehensive inpatient rehab setting.  Physiatrist is providing close team supervision and 24 hour management of active medical problems listed below.  Physiatrist and rehab team continue to assess barriers to discharge/monitor patient progress toward functional and medical goals  Care Tool:  Bathing    Body parts bathed by patient: Right arm, Left arm, Chest, Abdomen, Face   Body parts bathed by helper: Front perineal area, Buttocks, Left lower leg, Right lower leg, Left upper leg, Right upper leg     Bathing assist Assist Level: Total Assistance - Patient < 25%     Upper Body Dressing/Undressing Upper body dressing   What is the patient wearing?: Pull over shirt    Upper body assist Assist Level: Maximal Assistance - Patient 25 - 49%    Lower Body Dressing/Undressing Lower body dressing            Lower body  assist Assist for lower body dressing: Total Assistance - Patient < 25%     Toileting Toileting Toileting Activity did not occur Landscape architect and hygiene only): Safety/medical concerns  Toileting assist Assist for toileting: 2 Helpers     Transfers Chair/bed transfer  Transfers assist  Chair/bed transfer activity did not occur: Safety/medical concerns  Chair/bed transfer assist level: 2 Helpers     Locomotion Ambulation   Ambulation assist   Ambulation activity did not occur: Safety/medical concerns          Walk 10 feet activity   Assist  Walk 10 feet activity  did not occur: Safety/medical concerns        Walk 50 feet activity   Assist Walk 50 feet with 2 turns activity did not occur: Safety/medical concerns         Walk 150 feet activity   Assist Walk 150 feet activity did not occur: Safety/medical concerns         Walk 10 feet on uneven surface  activity   Assist Walk 10 feet on uneven surfaces activity did not occur: Safety/medical concerns         Wheelchair     Assist   Type of Wheelchair: Manual    Wheelchair assist level: Dependent - Patient 0% Max wheelchair distance: 100    Wheelchair 50 feet with 2 turns activity    Assist        Assist Level: Dependent - Patient 0%   Wheelchair 150 feet activity     Assist     Assist Level: Dependent - Patient 0%      Medical Problem List and Plan: 1.Decreased functional mobilitysecondary to Left femoral neck fracture. 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 Weightbearing as tolerated  Continue CIR 2. Antithrombotics: -DVT/anticoagulation:Subcutaneous Lovenox. vascular study negative for DVT -antiplatelet therapy: N/A 3. Pain Management:Robaxin and oxycodone as needed 4. Mood:Wellbutrin 150 mg daily and 300 mg daily at bedtime  Celexa 40 mg daily, decreased to 20 mg on 3/11 due with prolonged QT  Provide emotional support -antipsychotic agents: see above 5. Neuropsych: This patientisnot fully capable of making decisions on hisown behalf. 6. Skin/Wound Care:Routine skin checks 7. Fluids/Electrolytes/Nutrition:Routine in and out's   BMP within acceptable range on 3/11  D3 thin diet, advance diet as tolerated 8. Acute blood loss anemia.   Hemoglobin 9.8 on 3/12  Continue to monitor 9. Parkinson disease. Sinemet 25-100 1-2 tablets 3 times a day,Comtan 200 mg 3 times a day,Requip 1 mg 3 times a day,  Will discuss with neurology 10. Chronic idiopathic constipation.  Linzess290 mcg daily 11. Overactive bladder. Toviaz 4 mg daily 12.  Hyperglycemia  Monitor with increased mobility 13.  Leukocytosis  WBCs 15.2 on 3/12  Patient now with jerking movements, disorientation, hallucinations  UA/culture ordered  Ammonia ordered  Will order chest x-ray  Afebrile  Continue to monitor 14.  Hypoalbuminemia  Supplement initiated on 3/11 15.  Labile blood pressure  Monitor for trend  LOS: 2 days A FACE TO FACE EVALUATION WAS PERFORMED  Lizbett Garciagarcia Lorie Phenix 12/13/2018, 10:44 AM

## 2018-12-13 NOTE — Progress Notes (Signed)
Physical Therapy Session Note  Patient Details  Name: [name] [name] MRN: [id number] Date of Birth: 01-02-1951  Today's Date: 12/13/2018 PT Missed Time: 64 Minutes Missed Time Reason: Other (Comment)(AMS and hallucinations)  Pt's wife declining attempts at therapy this afternoon 2/2 ongoing AMS and hallucinations affecting safe mobility. RN and MD already aware. Pt w/ noticeable global tremors and restlessness while in supine. Per wife, it took NT 10+ minutes to take vitals 2/2 pt resisting. Missed 30 min of skilled PT.   [employer] Clent Demark 12/13/2018, 5:10 PM

## 2018-12-13 NOTE — Plan of Care (Signed)
  Problem: RH BOWEL ELIMINATION Goal: RH STG MANAGE BOWEL WITH ASSISTANCE Description STG Manage Bowel with min Assistance.  12/13/2018 1450 by Gerald Stabs, LPN Outcome: Progressing 12/13/2018 1450 by Gerald Stabs, LPN Outcome: Progressing   Problem: RH BLADDER ELIMINATION Goal: RH STG MANAGE BLADDER WITH ASSISTANCE Description STG Manage Bladder With min Assistance  12/13/2018 1450 by Gerald Stabs, LPN Outcome: Progressing 12/13/2018 1450 by Gerald Stabs, LPN Outcome: Progressing   Problem: RH SKIN INTEGRITY Goal: RH STG SKIN FREE OF INFECTION/BREAKDOWN 12/13/2018 1450 by Gerald Stabs, LPN Outcome: Progressing 12/13/2018 1450 by Gerald Stabs, LPN Outcome: Progressing Goal: RH STG MAINTAIN SKIN INTEGRITY WITH ASSISTANCE Description STG Maintain Skin Integrity With min Assistance.  12/13/2018 1450 by Gerald Stabs, LPN Outcome: Progressing 12/13/2018 1450 by Gerald Stabs, LPN Outcome: Progressing   Problem: RH SAFETY Goal: RH STG ADHERE TO SAFETY PRECAUTIONS W/ASSISTANCE/DEVICE Description STG Adhere to Safety Precautions With min Assistance/Device.  12/13/2018 1450 by Gerald Stabs, LPN Outcome: Progressing 12/13/2018 1450 by Gerald Stabs, LPN Outcome: Progressing   Problem: RH PAIN MANAGEMENT Goal: RH STG PAIN MANAGED AT OR BELOW PT'S PAIN GOAL Description Less than 3 out of 10  12/13/2018 1450 by Gerald Stabs, LPN Outcome: Progressing 12/13/2018 1450 by Gerald Stabs, LPN Outcome: Progressing   Problem: RH KNOWLEDGE DEFICIT GENERAL Goal: RH STG INCREASE KNOWLEDGE OF SELF CARE AFTER HOSPITALIZATION 12/13/2018 1450 by Gerald Stabs, LPN Outcome: Progressing 12/13/2018 1450 by Gerald Stabs, LPN Outcome: Progressing   Problem: Consults Goal: [facility] GENERAL PATIENT EDUCATION Description See Patient Education module for education specifics. 12/13/2018 1450 by Gerald Stabs, LPN Outcome: Progressing 12/13/2018 1450 by Gerald Stabs,  LPN Outcome: Progressing

## 2018-12-13 NOTE — Progress Notes (Signed)
Around 0800, pt's wife stated that pt's tremors seem to be worsening. Pt was difficult to arouse and unable to follow directions. Disoriented to place, time, and situation. Hallucinations also present. Linna Hoff, PA made aware. New orders obtained. Around 1000, pt was observed with some sweating, vital signs stable. UA/CS sent to lab around. Wife is at bedside, anxious about pt's condition. Pt is currently resting in bed. Tremors have decreased per wife. Continue plan of care  [employer]

## 2018-12-13 NOTE — Patient Care Conference (Signed)
Inpatient RehabilitationTeam Conference and Plan of Care Update Date: 12/12/2018   Time: 2:45 PM    Patient Name: [name] [name]      Medical Record Number: [id number]  Date of Birth: 17-Jul-1951 Sex: Male         Room/Bed: 4M07C/4M07C-01 Payor Info: Payor: MEDICARE / Plan: MEDICARE PART A AND B / Product Type: *No Product type* /    Admitting Diagnosis: l hip fx sp total arthroplasty  Admit Date/Time:  12/11/2018  3:09 PM Admission Comments: No comment available   Primary Diagnosis:  <principal problem not specified> Principal Problem: <principal problem not specified>  Patient Active Problem List   Diagnosis Date Noted  . AMS (altered mental status)   . Labile blood pressure   . Dysphagia   . Acute blood loss anemia   . Leukocytosis   . Episode of recurrent major depressive disorder ([place])   . Hypoalbuminemia due to protein-calorie malnutrition ([place])   . Left displaced femoral neck fracture ([place]) 12/11/2018  . Closed fracture of neck of left femur ([place])   . Closed left hip fracture ([place]) 12/08/2018  . Left foot pain 07/26/2018  . TMJ (temporomandibular joint syndrome) 10/10/2017  . Parkinson's disease ([place]) 03/20/2017  . Bilateral lower extremity edema 03/20/2017  . Chronic venous insufficiency 09/13/2016  . Fatigue 03/15/2016  . Parkinsonian features 09/15/2015  . ED (erectile dysfunction) 09/15/2015  . MDD (major depressive disorder), recurrent episode, severe ([place]) 02/10/2014  . Nonspecific abnormal electrocardiogram (ECG) (EKG) 02/07/2014  . Non-compliant behavior 02/03/2014  . Morton's neuroma of left foot 08/07/2013  . Attention deficit disorder without mention of hyperactivity 03/13/2012  . Hyperglycemia 01/26/2012  . Hypogonadism male 01/24/2012  . CHEST TIGHTNESS 02/18/2008    Expected Discharge Date: Expected Discharge Date: 12/28/18  Team Members Present: Physician leading conference: Dr. Delice Lesch Social Worker Present: Lennart Pall, LCSW Nurse  Present: Rayetta Pigg, RN PT Present: Phylliss Bob, PTA;Barrie Folk, PT OT Present: Clyda Greener, OT PPS Coordinator present : Gunnar Fusi     Current Status/Progress Goal Weekly Team Focus  Medical   Decreased functional mobility secondary to  Left femoral neck fracture with hx of Parkinson's. 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  Improve mobility, transfers, WBCs  See above   Bowel/Bladder   incontinent of bowel and bladder, LBM 12/14/2018  Continent of bowel and bladder with min Assist  Assess bowel and bladder patter, assist as needed   Swallow/Nutrition/ Hydration             ADL's   mod assist for UB bathing with max assist for dressing.  He needed max assist for LB bathing and total assist for dressing.  total assist for toilet transfers.  Pt with severe confusion and decreased orientation  supervision to min assist overall  selfcare retraining, balance retraining, transfer training, AE education, DME education, cognitive retraining   Mobility   total assist bed mobility. Stedy transfers with min-mod assist and increased time. total-dependent WC mobility   Min assist bed mobility, trasnfers, and ambulation at house hold level.   improved awareness, balance, transfers, safety, initiation of gait.    Communication             Safety/Cognition/ Behavioral Observations  Lethargic, disoriented, hallucination present, unable to follow directions, combative at times, telesitter in place  Pt remains free of injury  Assess cognitive status every shift, provide safety attendant as needed   Pain   Unable to tell pain status, pt disoriented,  lethargic, hallucinations present, occasionally grimacing,   Pain within acceptable level per pt.  Assess pain status every shift, provide pain med as needed   Skin   incision on left hip, periwound bruising. dressing change done  Skin free of pressure injury or infection  Assess skin every shift, dressing  change every shift    Rehab Goals Patient on target to meet rehab goals: Yes *See Care Plan and progress notes for long and short-term goals.     Barriers to Discharge  Current Status/Progress Possible Resolutions Date Resolved   Physician    Medical stability     See above  Therapies, blood glucose, follow labs      Nursing                  PT  Inaccessible home environment;Home environment access/layout;Behavior                 OT Inaccessible home environment  full baths on second floor as well as steps to get in from outside             SLP                SW                Discharge Planning/Teaching Needs:  Plan for pt to return home with wife who can provide 24/7 assistance.  Teaching to be planned closer to d/c.   Team Discussion:  New eval;  Hip fx/ THR/ Parkinsons.  Wife reporting she feels his tremors are worse than PTA.  Pt confused with therapies on eval and required mod - total assistance.  Standing only with STEDY.  Poor motor planning.  Extreme rigidity with movement. Steps may be a barrier and may need ramp.  Wife reports pt was completely independent PTA.  Currently in tilt w/c.  Very physically and cognitively impaired.  Hopeful for min assist goals.  Monitor for possible need for ST.  ?cause of poor cognition?  Revisions to Treatment Plan:  NA    Continued Need for Acute Rehabilitation Level of Care: The patient requires daily medical management by a physician with specialized training in physical medicine and rehabilitation for the following conditions: Daily direction of a multidisciplinary physical rehabilitation program to ensure safe treatment while eliciting the highest outcome that is of practical value to the patient.: Yes Daily medical management of patient stability for increased activity during participation in an intensive rehabilitation regime.: Yes Daily analysis of laboratory values and/or radiology reports with any subsequent need for medication  adjustment of medical intervention for : Post surgical problems;Neurological problems;Other   I attest that I was present, lead the team conference, and concur with the assessment and plan of the team.   Mindi Akerson 12/14/2018, 1:53 PM

## 2018-12-13 NOTE — Progress Notes (Signed)
Occupational Therapy Session Note  Patient Details  Name: DYLEN MCELHANNON MRN: [id number] Date of Birth: 04/22/1951  Today's Date: 12/13/2018 OT Individual Time: 6286-3817 OT Individual Time Calculation (min): 40 min    Short Term Goals: Week 1:  OT Short Term Goal 1 (Week 1): Pt will complete LB bathing with mod assist sit to stand. OT Short Term Goal 2 (Week 1): Pt will complete LB dressing with mod assist using AE PRN. OT Short Term Goal 3 (Week 1): Pt will complete UB dressing with min assist 2 consecutive sessions. OT Short Term Goal 4 (Week 1): Pt will perform stand pivot transfer to the 3:1 with mod assist using the RW for support.   Skilled Therapeutic Interventions/Progress Updates:    Pt seen for limited OT session this am.  He was currently in bed at start of session with nursing present.  Pt not oriented to place or situation. Stating that this was a "rehab where birds go".  He was exhibiting UE tremors with decreased ability to initiate and follow instructional commands.  Therapist transferred pt to the EOB with total assist.  Pt with posterior lean but he was able to sit statically with min assist overall.  Total assist for scooting forward to the EOB.  Once sitting pt demonstrated full body shaking/tremor with occasional jerking of his body noted.  He still demonstrated confusion with decreased ability to follow any instructional command when attempting use of the [facility] for sit to stand.  When trying to position his feet on the [facility] he was resistant to placement and was unable to assist like previous days session. He did not report any left hip pain but instead reported back pain.  Pt's wife in toward end of session and attempted to be supportive, however she exhibits some increased anxiety to pt's current state.  Had pt return to supine secondary to current condition and cognitive level with total assist +2 (pt 20%).  Pt left with spouse and PA notified of pt's current condition.     Therapy Documentation Precautions:  Precautions Precautions: Fall Precaution Comments: history of parkinson's Restrictions Weight Bearing Restrictions: No LLE Weight Bearing: Weight bearing as tolerated General: General OT Amount of Missed Time: 20 Minutes PT Missed Treatment Reason: MD hold (Comment)  Pain: Pain Assessment Pain Scale: Faces Faces Pain Scale: Hurts a little bit Pain Type: Acute pain Pain Location: Back Pain Orientation: Lower Pain Descriptors / Indicators: Discomfort Pain Intervention(s): Repositioned;Emotional support ADL: See OT Care Tool for some details of ADLs  Therapy/Group: Individual Therapy  Cinthia Rodden OTR/L 12/13/2018, 12:11 PM

## 2018-12-13 NOTE — Progress Notes (Signed)
Physical Therapy Session Note  Patient Details  Name: DEZ STAUFFER MRN: [id number] Date of Birth: 1950/12/26  Today's Date: 12/13/2018   Short Term Goals: Week 1:  PT Short Term Goal 1 (Week 1): Pt will perform bed mobility with max assist  PT Short Term Goal 2 (Week 1): Pt will perform bed<>transfers with mod assist  PT Short Term Goal 3 (Week 1): Pt will propell WC with min assist x 167ft  PT Short Term Goal 4 (Week 1): Pt will ambulate 26ft with mod assist   Skilled Therapeutic Interventions/Progress Updates:   Pt on hold per PA. Decline in status overnight with increased chills, tremors, hallucinations, and sweating; awaiting lab results. Will continue to follow as medically appropriate.       Therapy Documentation Precautions:  Precautions Precautions: Fall Precaution Comments: history of parkinson's Restrictions Weight Bearing Restrictions: No LLE Weight Bearing: Weight bearing as tolerated General:   missed time: 60 min ( MD hold due to decline in pt status)     Therapy/Group: Individual Therapy  Lorie Phenix 12/13/2018, 10:42 AM

## 2018-12-13 NOTE — Consult Note (Addendum)
NEURO HOSPITALIST CONSULT NOTE   Requestig physician: Dr. Posey Pronto  Reason for Consult: increased tremors  History obtained from:  Wife and chart  HPI:                                                                                                                                          LILLARD BAILON is an 68 y.o. male with PMH of Parkinson disease and MDD who underwent recent left hip replacement due to left hip fracture following fall; he was admitted to inpatient rehab on 3/10 and noted to have increased tremors and altered mentation by his wife and staff.   History provided by wife. She states that during hospital admission, her husband was getting an inappropriate dose of sinemet during initial hospitalization, however since admission to rehab his medicines have been administered appropriately. She noted change occurred shortly after he received 2 enemas for constipation 2 days ago however has been getting progressively worse. She is very worried that he may have an infection which is causing his symptoms.   Per chart review, he has been afebrile, with stable BP and HR. His labs reveal an increasing leukocytosis from 12>15, stable electrolytes and creatine with uptrending BUN to 24. UA and CXR are pending.  Past Medical History:  Diagnosis Date  . APPENDECTOMY, HX OF 02/18/2008   Qualifier: Diagnosis of  By: Danny Lawless CMA, [place]    . Cardiac murmur    as a child  . Chronic idiopathic constipation   . Depression   . Headache(784.0)   . HEADACHES, HX OF 02/18/2008   Qualifier: Diagnosis of  By: Danny Lawless CMA, [place]    . Parkinson disease ([place]) 11/2014  . Sinus mucosal thickening    pt unable to lay flat  . Streptococcal meningitis    as an infant    Past Surgical History:  Procedure Laterality Date  . APPENDECTOMY  1967  . COLONOSCOPY    . NASAL SINUS SURGERY     x 4 as a child  . TOTAL HIP ARTHROPLASTY Left 12/09/2018   Procedure: TOTAL HIP ARTHROPLASTY  ANTERIOR APPROACH;  Surgeon: Leandrew Koyanagi, MD;  Location: [facility];  Service: Orthopedics;  Laterality: Left;  Marland Kitchen VASECTOMY      Family History  Problem Relation Age of Onset  . Lung cancer Father   . Alcohol abuse Brother   . Drug abuse Brother   . Seizures Daughter   . Colon cancer Neg Hx   . Stomach cancer Neg Hx   . Rectal cancer Neg Hx   . Esophageal cancer Neg Hx     Social History:  reports that he has never smoked. He has never used smokeless tobacco. He reports that he does not drink alcohol or use drugs.  No  Known Allergies  MEDICATIONS:                                                                                                                     . buPROPion  300 mg Oral Daily   And  . buPROPion  150 mg Oral Q1500  . carbidopa-levodopa  1 tablet Oral Daily  . carbidopa-levodopa  2 tablet Oral BID  . citalopram  20 mg Oral Daily  . docusate sodium  100 mg Oral BID  . enoxaparin (LOVENOX) injection  40 mg Subcutaneous Q24H  . entacapone  200 mg Oral TID  . feeding supplement (PRO-STAT SUGAR FREE 64)  30 mL Oral BID  . fesoterodine  4 mg Oral Daily  . linaclotide  290 mcg Oral QAC breakfast  . rOPINIRole  1 mg Oral TID     ROS:                                                                                                                                       History obtained from spouse. As per HPI.    Blood pressure 132/61, pulse 83, temperature (P) 98.7 F (37.1 C), resp. rate 18, SpO2 91 %.   General Examination:                                                                                                       Physical Exam  HEENT-  Normocephalic, no lesions, without obvious abnormality.  Normal external eye and conjunctiva. Mild seborrheic dermatitis on face.   Lungs-no increased work of breathing  Extremities- Warm, dry and intact Musculoskeletal-dressing over left lateral hip joint Skin-warm and dry, no hyperpigmentation, vitiligo, or suspicious  lesions  Neurological Examination Mental Status: Alert, oriented to year/month/location but not to date.  Repetition intact. Speech fluent without errors of grammar or syntax. Able to follow a directional 3 step command except did so on wrong side. Appears agitated and anxious, but able to speak with normal sentences.  Cranial Nerves: II: Visual fields grossly normal III,IV, VI: ptosis not present,EOMI without nystagmus.  V,VII: smile symmetric, facial light touch and temperature sensation equal bilaterally VIII: hearing normal bilaterally IX,X: palate rises symmetrically, uvula midline XI: bilateral shoulder shrug XII: midline tongue extension Motor: Right : Upper extremity   5/5 on grip, 2/5 otherwise which appears to be due to poor effort   Lower extremity   4/5   Left:     Upper extremity   5/5  Lower extremity   5/5 on APF and ADF, more proximally not tested due to very recent left hip replacement     Atypical prominent resting tremor involving both proximal and distal upper extremities; cogwheel rigidity in bil UEs; chin tremor and vocal tremor; hypophonia. Coarse arm tremors were noted to be distractable, resolving completely for short periods of time when patient concentrated on exam performed on other limbs. Sensory: BUEs sensation to temperature and light touch intact, no extinction to DSS; BLEs with decreased sensation to temperature on left thigh, extinction on right to DSS but intact light touch sensation bilaterally Deep Tendon Reflexes: brachioradialis - 2 bilaterally; patellar - 3 and brisk bilaterally; unable to elicit bil achilles reflexes due to inability to relax Plantars: Right: upgoing   Left: upgoing Cerebellar: Dysmetria bilaterally on finger-to-nose testing   Lab Results: Basic Metabolic Panel: Recent Labs  Lab 12/08/18 1707 12/09/18 1217 12/10/18 0327 12/11/18 0313 12/12/18 0507  NA 139  --  136 137 136  K 3.7  --  4.1 3.8 3.7  CL 108  --  106 105 107   CO2 24  --  23 23 20*  GLUCOSE 93  --  139* 129* 146*  BUN 17  --  15 19 16   CREATININE 1.13 1.18 1.24 1.10 1.07  CALCIUM 8.6*  --  8.2* 8.5* 8.4*    CBC: Recent Labs  Lab 12/08/18 1707 12/09/18 1217 12/10/18 0327 12/11/18 0313 12/12/18 0507 12/13/18 1014  WBC 10.5 14.0* 13.9* 10.5 11.7* 15.2*  NEUTROABS 8.7*  --   --   --  10.1*  --   HGB 12.9* 11.5* 9.8* 9.3* 9.8* 9.8*  HCT 40.0 34.8* 29.1* 27.7* 28.0* 27.9*  MCV 96.4 93.3 91.5 90.5 89.7 89.7  PLT 262 229 202 181 216 274    Cardiac Enzymes: No results for input(s): CKTOTAL, CKMB, CKMBINDEX, TROPONINI in the last 168 hours.  Lipid Panel: No results for input(s): CHOL, TRIG, HDL, CHOLHDL, VLDL, LDLCALC in the last 168 hours.  Imaging: Vas [place] Lower Extremity Venous (dvt)  Result Date: 12/12/2018  Lower Venous Study Indications: Swelling.  Limitations: Body habitus and patient positioning. Performing Technologist: Abram Sander RVS  Examination Guidelines: A complete evaluation includes B-mode imaging, spectral Doppler, color Doppler, and power Doppler as needed of all accessible portions of each vessel. Bilateral testing is considered an integral part of a complete examination. Limited examinations for reoccurring indications may be performed as noted.  Right Venous Findings: +---------+---------------+---------+-----------+----------+------------------+          CompressibilityPhasicitySpontaneityPropertiesSummary            +---------+---------------+---------+-----------+----------+------------------+ CFV      Full           Yes      Yes                                     +---------+---------------+---------+-----------+----------+------------------+ SFJ      Full                                                            +---------+---------------+---------+-----------+----------+------------------+  FV Prox  Full                                                             +---------+---------------+---------+-----------+----------+------------------+ FV Mid   Full                                                            +---------+---------------+---------+-----------+----------+------------------+ FV DistalFull                                                            +---------+---------------+---------+-----------+----------+------------------+ PFV      Full                                                            +---------+---------------+---------+-----------+----------+------------------+ POP                     Yes      Yes                  limited                                                                  visualization      +---------+---------------+---------+-----------+----------+------------------+ PTV      Full                                                            +---------+---------------+---------+-----------+----------+------------------+ PERO     Full                                                            +---------+---------------+---------+-----------+----------+------------------+  Left Venous Findings: +---------+---------------+---------+-----------+----------+------------------+          CompressibilityPhasicitySpontaneityPropertiesSummary            +---------+---------------+---------+-----------+----------+------------------+ CFV      Full           Yes      Yes                                     +---------+---------------+---------+-----------+----------+------------------+ SFJ  Full                                                            +---------+---------------+---------+-----------+----------+------------------+ FV Prox  Full                                                            +---------+---------------+---------+-----------+----------+------------------+ FV Mid   Full                                                             +---------+---------------+---------+-----------+----------+------------------+ FV DistalFull                                                            +---------+---------------+---------+-----------+----------+------------------+ PFV      Full                                                            +---------+---------------+---------+-----------+----------+------------------+ POP      Full           Yes      Yes                  limited                                                                  visualization      +---------+---------------+---------+-----------+----------+------------------+ PTV      Full                                                            +---------+---------------+---------+-----------+----------+------------------+ PERO                                                  Not visualized     +---------+---------------+---------+-----------+----------+------------------+    Summary: Right: There is no evidence of deep vein thrombosis in the lower extremity. However, portions of this examination were limited- see technologist comments above. No cystic structure found in the popliteal fossa. Left: There is no evidence of  deep vein thrombosis in the lower extremity. However, portions of this examination were limited- see technologist comments above. No cystic structure found in the popliteal fossa.  *See table(s) above for measurements and observations. Electronically signed by Harold Barban MD on 12/12/2018 at 18:49:32 PM.    Final     Assessment: 68 year old male with PMH of Parkinson disease and MDD who recently underwent total hip replacement for left hip fracture following a fall, now with increased tremors and reported hallucinations. 1. Based on exam, patient is alert and oriented except for date, otherwise not grossly encephalopathic. He has an atypical tremor in his extremities that waxes and wanes, however seems to consistently  improve with distraction pointing to anxiety as the most likely underlying etiology for his worsened movements.   2. He may have component of mild delirium in setting of hospitalization, surgery, pain medications, and change in location. 3. He does exhibit consistent and objective signs of Parkinson disease such as cogwheel rigidity, vocal and chin tremor, however overall no indication to adjust his Parkinson's meds at this time as these findings are significantly less prominent than the components of his exam which are most likely attributable to agitation and anxiety. 4. Elevated BUN/Cr and decreased hydration to oral mucosa, suggestive of mild volume depletion.  5. Elevated WBC count of 15.2   Recommendations: 1. Trial of Ativan 1 mg once to assess response; if improved can consider buspar 2. Avoid antipsychotics in setting of Parkinson's 3. Continue current parkinson's meds at current doses - sinemet 50-200mg  TID, Ropinirole 1mg  TID, and entacapone 200mg  TID 4. Agree with ongoing workup for infectious etiology  5. Hydrate well.  6. Avoid sedating meds 7. OOB to chair when able to tolerate.    Alphonzo Grieve, MD  PGY3 12/13/18    I have seen and evaluated the patient. I have performed the neurological exam, which was observed and documented by Dr. Jari Favre. I have formulated the assessment and recommendations.  Electronically signed: Dr. Kerney Elbe

## 2018-12-14 ENCOUNTER — Inpatient Hospital Stay (HOSPITAL_COMMUNITY): Payer: Medicare Other

## 2018-12-14 ENCOUNTER — Inpatient Hospital Stay (HOSPITAL_COMMUNITY): Payer: Self-pay | Admitting: Speech Pathology

## 2018-12-14 ENCOUNTER — Inpatient Hospital Stay (HOSPITAL_COMMUNITY): Payer: Self-pay | Admitting: Occupational Therapy

## 2018-12-14 ENCOUNTER — Inpatient Hospital Stay (HOSPITAL_COMMUNITY): Payer: Self-pay | Admitting: Physical Therapy

## 2018-12-14 LAB — URINE CULTURE: Culture: NO GROWTH

## 2018-12-14 MED ORDER — ACETAMINOPHEN 500 MG PO TABS
500.0000 mg | ORAL_TABLET | Freq: Four times a day (QID) | ORAL | Status: DC
  Administered 2018-12-14 – 2018-12-29 (×24): 500 mg via ORAL
  Filled 2018-12-14 (×38): qty 1

## 2018-12-14 NOTE — Evaluation (Signed)
Speech Language Bedside Swallow Evalaution  Patient Details  Name: [name] [name] MRN: [id number] Date of Birth: [birth year]  SLP Diagnosis: Dysphagia  Rehab Potential: Fair ELOS: 16-21 days     Today's Date: 12/14/2018 SLP Individual Time: 1130-1200 SLP Individual Time Calculation (min): 30 min and Today's Date: 12/14/2018 SLP Missed Time:   Missed Time Reason: Toileting;Nursing care   Problem List:  Patient Active Problem List   Diagnosis Date Noted  . AMS (altered mental status)   . Labile blood pressure   . Dysphagia   . Acute blood loss anemia   . Leukocytosis   . Episode of recurrent major depressive disorder ([place])   . Hypoalbuminemia due to protein-calorie malnutrition ([place])   . Left displaced femoral neck fracture ([place]) 12/11/2018  . Closed fracture of neck of left femur ([place])   . Closed left hip fracture ([place]) 12/08/2018  . Left foot pain 07/26/2018  . TMJ (temporomandibular joint syndrome) 10/10/2017  . Parkinson's disease ([place]) 03/20/2017  . Bilateral lower extremity edema 03/20/2017  . Chronic venous insufficiency 09/13/2016  . Fatigue 03/15/2016  . Parkinsonian features 09/15/2015  . ED (erectile dysfunction) 09/15/2015  . MDD (major depressive disorder), recurrent episode, severe ([place]) 02/10/2014  . Nonspecific abnormal electrocardiogram (ECG) (EKG) 02/07/2014  . Non-compliant behavior 02/03/2014  . Morton's neuroma of left foot 08/07/2013  . Attention deficit disorder without mention of hyperactivity 03/13/2012  . Hyperglycemia 01/26/2012  . Hypogonadism male 01/24/2012  . CHEST TIGHTNESS 02/18/2008   Past Medical History:  Past Medical History:  Diagnosis Date  . APPENDECTOMY, HX OF 02/18/2008   Qualifier: Diagnosis of  By: Danny Lawless CMA, [place]    . Cardiac murmur    as a child  . Chronic idiopathic constipation   . Depression   . Headache(784.0)   . HEADACHES, HX OF 02/18/2008   Qualifier: Diagnosis of  By: Danny Lawless CMA, [place]    . Parkinson  disease ([place]) 11/2014  . Sinus mucosal thickening    pt unable to lay flat  . Streptococcal meningitis    as an infant   Past Surgical History:  Past Surgical History:  Procedure Laterality Date  . APPENDECTOMY  1967  . COLONOSCOPY    . NASAL SINUS SURGERY     x 4 as a child  . 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 Upon arrival, patient was upright in bed but required Max encouragement to keep his eyes open with intermittent tremors and language of confusion noted. Patient consumed trials of thin liquids via straw without overt s/s of aspiration after consecutive sips and after multiple whole medications administered. Patient with mildly prolonged AP transit and prolonged mastication of solid textures due to impaired attention and awareness of bolus. Total A needed for feeding due to tremors. Recommend patient continue thin liquids but downgrade to Dys. 2 textures to maximize safety and energy conservation with PO intake. Patient's wife verbalized understanding and signed off to provide supervision with meals. SLP will f/u for tolerance and possible upgrade if cognitive function improves.    Skilled Therapeutic Interventions          Administered a BSE, please see above for details. Educated patient's wife in regards to patient's current oral impairments impacting safety with swallowing, she verbalized  understanding.   SLP Assessment  Patient will need skilled Speech Lanaguage Pathology Services during CIR admission    Recommendations  SLP Diet Recommendations: Dysphagia 2 (Fine chop);Thin Liquid Administration via: Cup;Straw Medication Administration: Whole meds with liquid Supervision: Full supervision/cueing for compensatory strategies;Trained  caregiver to feed patient;Staff to assist with self feeding Compensations: Minimize environmental distractions;Slow rate;Small sips/bites Postural Changes and/or Swallow Maneuvers: Seated upright 90 degrees Oral Care Recommendations: Oral care BID Recommendations for Other Services: Neuropsych consult Patient destination: Home Follow up Recommendations: Home Health SLP;24 hour supervision/assistance Equipment Recommended: None recommended by SLP    SLP Frequency 3 to 5 out of 7 days   SLP Duration  SLP Intensity  SLP Treatment/Interventions 16-21 days   Minumum of 1-2 x/day, 30 to 90 minutes  Cueing hierarchy;Dysphagia/aspiration precaution training;Patient/family education;Therapeutic Activities;Environmental controls    Pain No/Denies Pain  Short Term Goals: Week 1: SLP Short Term Goal 1 (Week 1): Pt will utilize external aids to orient to place, date, and situation with max assist multimodal cues.   SLP Short Term Goal 2 (Week 1): Pt will sustain his attention to basic, familiar tasks for 2 minute intervals with max cues for redirection.   SLP Short Term Goal 3 (Week 1): Pt will recall basic, daily information with max assist for use of memory compensatory aids.   SLP Short Term Goal 4 (Week 1): Patient will consume current diet with minimal overt s/s of aspiration with Mod A verbal cues for use of swallowing compensatory strategies.   Refer to Care Plan for Long Term Goals  Recommendations for other services: Neuropsych  Discharge Criteria: Patient will be discharged from SLP if patient refuses treatment 3 consecutive times without medical reason, if treatment goals not met, if there is a change in medical status, if patient makes no progress towards goals or if patient is discharged from hospital.  The above assessment, treatment plan, treatment alternatives and goals were discussed and mutually agreed upon: by patient and by family  [name] [name] 12/14/2018, 3:37 PM

## 2018-12-14 NOTE — Care Management (Signed)
Inpatient [facility] Individual Statement of Services  Patient Name:  [name] [name]  Date:  12/14/2018  Welcome to the [facility].  Our goal is to provide you with an individualized program based on your diagnosis and situation, designed to meet your specific needs.  With this comprehensive rehabilitation program, you will be expected to participate in at least 3 hours of rehabilitation therapies Monday-Friday, with modified therapy programming on the weekends.  Your rehabilitation program will include the following services:  Physical Therapy (PT), Occupational Therapy (OT), Speech Therapy (ST), 24 hour per day rehabilitation nursing, Therapeutic Recreaction (TR), Neuropsychology, Case Management (Social Worker), Rehabilitation Medicine, Nutrition Services and Pharmacy Services  Weekly team conferences will be held on Wednesdays to discuss your progress.  Your Social Worker will talk with you frequently to get your input and to update you on team discussions.  Team conferences with you and your family in attendance may also be held.  Expected length of stay: 16-21 days   Overall anticipated outcome: minimal assistance  Depending on your progress and recovery, your program may change. Your Social Worker will coordinate services and will keep you informed of any changes. Your Social Worker's name and contact numbers are listed  below.  The following services may also be recommended but are not provided by the [facility] will be made to provide these services after discharge if needed.  Arrangements include referral to agencies that provide these services.  Your insurance has been verified to be:  Medicare; [place] Your primary doctor is:  Pricilla Holm  Pertinent information will be shared with your doctor and your  insurance company.  Social Worker:  [place], [place] or ([phone]   Information discussed with and copy given to patient by: Lennart Pall, 12/14/2018, 1:58 PM

## 2018-12-14 NOTE — Progress Notes (Signed)
Occupational Therapy Session Note  Patient Details  Name: OVIE CORNELIO MRN: [id number] Date of Birth: 09/02/1951  Today's Date: 12/14/2018 OT Individual Time: 0800-0900 OT Individual Time Calculation (min): 60 min    Short Term Goals: Week 1:  OT Short Term Goal 1 (Week 1): Pt will complete LB bathing with mod assist sit to stand. OT Short Term Goal 2 (Week 1): Pt will complete LB dressing with mod assist using AE PRN. OT Short Term Goal 3 (Week 1): Pt will complete UB dressing with min assist 2 consecutive sessions. OT Short Term Goal 4 (Week 1): Pt will perform stand pivot transfer to the 3:1 with mod assist using the RW for support.   Skilled Therapeutic Interventions/Progress Updates:    Pt worked on bathing and dressing in supine during session.  Pt still with visual hallucinations and confabulation of statements not related to current questions therapist asked him.  He was initially unable to roll in bed for checking brief, but after 20 mins of session he could roll both right and left with max demonstrational cueing and total +2 (pt 20%) for placement of bed pain, washing buttocks, and donning new brief.  Pt with eyes closed most of the time.  He was able to bring a cup to his mouth when presented to him at midline and max demonstrational cueing given for grasping cup and bringing it to his mouth.  He was able to wash his face with mod demonstrational cueing and mod assist but needed total assist to wash all other aspects secondary to confusion and decreased ability to follow commands.  Opted to not transfer pt OOB secondary to awareness and confusion as well as decreased initiation.  MD aware of pt's current status and PA notified for request for pt's spouse to talk to them.  Pt left in bed with call button and phone in reach and bed alarm in place.    Therapy Documentation Precautions:  Precautions Precautions: Fall Precaution Comments: history of  parkinson's Restrictions Weight Bearing Restrictions: No LLE Weight Bearing: Weight bearing as tolerated  Pain: Pain Assessment Pain Scale: Faces Pain Score: 0-No pain Faces Pain Scale: No hurt ADL: See Care Tool Section for some details of ADL  Therapy/Group: Individual Therapy  Courteny Egler OTR/L 12/14/2018, 12:09 PM

## 2018-12-14 NOTE — Progress Notes (Signed)
Social Work  Social Work Assessment and Plan  Patient Details  Name: [name] [name] MRN: [id number] Date of Birth: [birth year]  Today's Date: 12/14/2018  Problem List:  Patient Active Problem List   Diagnosis Date Noted  . AMS (altered mental status)   . Labile blood pressure   . Dysphagia   . Acute blood loss anemia   . Leukocytosis   . Episode of recurrent major depressive disorder ([place])   . Hypoalbuminemia due to protein-calorie malnutrition ([place])   . Left displaced femoral neck fracture ([place]) 12/11/2018  . Closed fracture of neck of left femur ([place])   . Closed left hip fracture ([place]) 12/08/2018  . Left foot pain 07/26/2018  . TMJ (temporomandibular joint syndrome) 10/10/2017  . Parkinson's disease ([place]) 03/20/2017  . Bilateral lower extremity edema 03/20/2017  . Chronic venous insufficiency 09/13/2016  . Fatigue 03/15/2016  . Parkinsonian features 09/15/2015  . ED (erectile dysfunction) 09/15/2015  . MDD (major depressive disorder), recurrent episode, severe ([place]) 02/10/2014  . Nonspecific abnormal electrocardiogram (ECG) (EKG) 02/07/2014  . Non-compliant behavior 02/03/2014  . Morton's neuroma of left foot 08/07/2013  . Attention deficit disorder without mention of hyperactivity 03/13/2012  . Hyperglycemia 01/26/2012  . Hypogonadism male 01/24/2012  . CHEST TIGHTNESS 02/18/2008   Past Medical History:  Past Medical History:  Diagnosis Date  . APPENDECTOMY, HX OF 02/18/2008   Qualifier: Diagnosis of  By: Danny Lawless CMA, [place]    . Cardiac murmur    as a child  . Chronic idiopathic constipation   . Depression   . Headache(784.0)   . HEADACHES, HX OF 02/18/2008   Qualifier: Diagnosis of  By: Danny Lawless CMA, [place]    . Parkinson disease ([place]) 11/2014  . Sinus mucosal thickening    pt unable to lay flat  . Streptococcal meningitis    as an infant   Past Surgical History:  Past Surgical History:  Procedure Laterality Date  . APPENDECTOMY  1967  . COLONOSCOPY     . NASAL SINUS SURGERY     x 4 as a child  . TOTAL HIP ARTHROPLASTY Left 12/09/2018   Procedure: TOTAL HIP ARTHROPLASTY ANTERIOR APPROACH;  Surgeon: Leandrew Koyanagi, MD;  Location: [facility];  Service: Orthopedics;  Laterality: Left;  Marland Kitchen VASECTOMY     Social History:  reports that he has never smoked. He has never used smokeless tobacco. He reports that he does not drink alcohol or use drugs.  Family / Support Systems Marital Status: Married How Long?: 82 yrs Patient Roles: Spouse, Parent Spouse/Significant Other: wife, [name] [name] @ (H) [phone] or (C) [phone] Children: Pt and wife have 3 adult children with two living in the home and other in [place]. Anticipated Caregiver: wife, [name] [name]  Ability/Limitations of Caregiver: None Caregiver Availability: 24/7 Family Dynamics: Wife very involved and supportive.  Very vocal about her concerns with pt's current "altered" presentation and insistent that this was never a state he presented in PTA.  Social History Preferred language: English Religion:  Cultural Background: NA Read: Yes Write: Yes Employment Status: Retired Public relations account executive Issues: None Guardian/Conservator: None - per MD, pt is not fully capable of making decisions on his own behalf.   Abuse/Neglect Abuse/Neglect Assessment Can Be Completed: Yes Physical Abuse: Denies Verbal Abuse: Denies Sexual Abuse: Denies Exploitation of patient/patient's resources: Denies Self-Neglect: Denies  Emotional Status Pt's affect, behavior and adjustment status: Pt with significant confusion, oriented to person only and with tremors and jerking as I complete intake assessment with  spouse.  Can not assess mood at this time.  Will monitor if cognition begins to clear and refer to neuropsychology as indicated. Recent Psychosocial Issues: None Psychiatric History: pt with h/o depression - on medication Substance Abuse History: NA  Patient / Family Perceptions, Expectations &  Goals Pt/Family understanding of illness & functional limitations: As noted, pt with significant cognitive impairments.  Wife with good awareness of fx, replacement surgery and concerned about his current "altered state". Premorbid pt/family roles/activities: Per wife, pt independent overall PTA and would only use cane occasionally Anticipated changes in roles/activities/participation: Dependent on cognitive clearing as this is significantly limiting progress with therapies Pt/family expectations/goals: "I need him to be back to his normal."  [employer]: None Premorbid Home Care/DME Agencies: None Transportation available at discharge: yes Resource referrals recommended: Neuropsychology, Support group (specify)  Discharge Planning Living Arrangements: Spouse/significant other, Children Support Systems: Spouse/significant other, Children Type of Residence: Private residence Insurance Resources: [employer], Multimedia programmer (specify)([employer]) Museum/gallery curator Resources: Radio broadcast assistant Screen Referred: No Living Expenses: Medical laboratory scientific officer Management: Spouse Does the patient have any problems obtaining your medications?: No Home Management: pt and spouse Patient/Family Preliminary Plans: Pt to return home with wife providing 24/7 support. Social Work Anticipated Follow Up Needs: HH/OP, Support Group Expected length of stay: 16-21 days  Clinical Impression Very unfortunate gentleman here following a fall at home and suffering hip fx.  Underwent THR and is full WB, however, with known Parkinson's and currently showing significant tremors/ jerking and confusion.  Wife provide all of the intake information as pt oriented to person only and keeps eyes closed throughout interview.  Wife very supportive and able to provide 24/7 support, however, she is very concerned about pt's current presentation - MD fully aware of her concerns.  Will follow for support and d/c  planning needs.  Camy Leder 12/14/2018, 1:54 PM

## 2018-12-14 NOTE — Plan of Care (Signed)
  Problem: RH BOWEL ELIMINATION Goal: RH STG MANAGE BOWEL WITH ASSISTANCE Description STG Manage Bowel with min Assistance.  Outcome: Progressing   Problem: RH BLADDER ELIMINATION Goal: RH STG MANAGE BLADDER WITH ASSISTANCE Description STG Manage Bladder With min Assistance  Outcome: Progressing   Problem: RH SKIN INTEGRITY Goal: RH STG SKIN FREE OF INFECTION/BREAKDOWN Outcome: Progressing Goal: RH STG MAINTAIN SKIN INTEGRITY WITH ASSISTANCE Description STG Maintain Skin Integrity With min Assistance.  Outcome: Progressing   Problem: RH SAFETY Goal: RH STG ADHERE TO SAFETY PRECAUTIONS W/ASSISTANCE/DEVICE Description STG Adhere to Safety Precautions With min Assistance/Device.  Outcome: Progressing   Problem: RH PAIN MANAGEMENT Goal: RH STG PAIN MANAGED AT OR BELOW PT'S PAIN GOAL Description Less than 3 out of 10  Outcome: Progressing   Problem: RH KNOWLEDGE DEFICIT GENERAL Goal: RH STG INCREASE KNOWLEDGE OF SELF CARE AFTER HOSPITALIZATION Outcome: Progressing   Problem: Consults Goal: RH GENERAL PATIENT EDUCATION Description See Patient Education module for education specifics. Outcome: Progressing

## 2018-12-14 NOTE — Progress Notes (Signed)
Physical Therapy Session Note  Patient Details  Name: [name] [name] MRN: [id number] Date of Birth: 1951/04/17  Today's Date: 12/14/2018 PT Individual Time: 4370-0525 PT Individual Time Calculation (min): 75 min   Short Term Goals: Week 1:  PT Short Term Goal 1 (Week 1): Pt will perform bed mobility with max assist  PT Short Term Goal 2 (Week 1): Pt will perform bed<>transfers with mod assist  PT Short Term Goal 3 (Week 1): Pt will propell WC with min assist x 139f  PT Short Term Goal 4 (Week 1): Pt will ambulate 13fwith mod assist   Skilled Therapeutic Interventions/Progress Updates:   Pt received supine in bed. Continues to have inconsistent severe resting tremors In BUE, and noted to have intermittent language of confusion . Pt's Wife adamant that pt needs to have bowel movement, and that he wants to get up.   Supine>sit with total assist from PT, with active resistance from pt, stating that he had a little pain in the L hip , but also said "if you let me go, I can finish the show all over again." PT required to facilitate movement of BLE as well as pelvic positioning. To improve safety at EOB. Once Pt EOB, able to sit with min-supervision assist and BUE.  Sit>stand in stedy from elevated bed height with max-total cues for orientation to situation as well as motor planning to reach for grab bar on the stedy. Sit>stand with max assist to obtain full upright positioning. stedy transfer to toilet with + 2 assist from Wife to manage stedy parts   Pt able to to have BM with increased time on BSC. Supervision assist sitting balance on BSC with UE supported on stedy. Intermittent jerking and tremor in UEE, +2 max assist [facility] peri care while pt in stedy.   Patient returned to room and [facility] in reach and all needs met.          Therapy Documentation Precautions:  Precautions Precautions: Fall Precaution Comments: history of  parkinson's Restrictions Weight Bearing Restrictions: No LLE Weight Bearing: Weight bearing as tolerated Vital Signs: Therapy Vitals Temp: 100 F (37.8 C) Temp Source: Oral Pulse Rate: 82 Resp: 19 BP: 124/77 Patient Position (if appropriate): Sitting Oxygen Therapy SpO2: 94 % O2 Device: Room Air Pain: Faces: hurts a little   Therapy/Group: Individual Therapy  AuLorie Phenix/13/2020, 3:26 PM

## 2018-12-14 NOTE — Progress Notes (Signed)
[place] PHYSICAL MEDICINE & REHABILITATION PROGRESS NOTE  Subjective/Complaints: Patient seen laying in bed this morning.  Noted to be jerking upon arrival.  He is tangential and confused.  Discussed with therapies as well.  Yesterday, he was evaluated by neurology.  Discussed with PA  ROS: Limited due to cognition.  Objective: Vital Signs: Blood pressure (!) 142/70, pulse 84, temperature 99.8 F (37.7 C), temperature source Oral, resp. rate (!) 21, SpO2 92 %. Dg Chest 2 View  Result Date: 12/13/2018 CLINICAL DATA:  Altered mental status. EXAM: CHEST - 2 VIEW COMPARISON:  None. FINDINGS: The heart size is exaggerated by low lung volumes. Asymmetric left basilar airspace disease is present. No edema or effusion is present. No other focal airspace disease is evident. IMPRESSION: 1. Asymmetric left basilar airspace disease likely reflects atelectasis. Infection or aspiration is not excluded. Electronically Signed   By: San Morelle M.D.   On: 12/13/2018 16:02   Vas [place] Lower Extremity Venous (dvt)  Result Date: 12/12/2018  Lower Venous Study Indications: Swelling.  Limitations: Body habitus and patient positioning. Performing Technologist: Abram Sander RVS  Examination Guidelines: A complete evaluation includes B-mode imaging, spectral Doppler, color Doppler, and power Doppler as needed of all accessible portions of each vessel. Bilateral testing is considered an integral part of a complete examination. Limited examinations for reoccurring indications may be performed as noted.  Right Venous Findings: +---------+---------------+---------+-----------+----------+------------------+          CompressibilityPhasicitySpontaneityPropertiesSummary            +---------+---------------+---------+-----------+----------+------------------+ CFV      Full           Yes      Yes                                      +---------+---------------+---------+-----------+----------+------------------+ SFJ      Full                                                            +---------+---------------+---------+-----------+----------+------------------+ FV Prox  Full                                                            +---------+---------------+---------+-----------+----------+------------------+ FV Mid   Full                                                            +---------+---------------+---------+-----------+----------+------------------+ FV DistalFull                                                            +---------+---------------+---------+-----------+----------+------------------+ PFV      Full                                                            +---------+---------------+---------+-----------+----------+------------------+  POP                     Yes      Yes                  limited                                                                  visualization      +---------+---------------+---------+-----------+----------+------------------+ PTV      Full                                                            +---------+---------------+---------+-----------+----------+------------------+ PERO     Full                                                            +---------+---------------+---------+-----------+----------+------------------+  Left Venous Findings: +---------+---------------+---------+-----------+----------+------------------+          CompressibilityPhasicitySpontaneityPropertiesSummary            +---------+---------------+---------+-----------+----------+------------------+ CFV      Full           Yes      Yes                                     +---------+---------------+---------+-----------+----------+------------------+ SFJ      Full                                                             +---------+---------------+---------+-----------+----------+------------------+ FV Prox  Full                                                            +---------+---------------+---------+-----------+----------+------------------+ FV Mid   Full                                                            +---------+---------------+---------+-----------+----------+------------------+ FV DistalFull                                                            +---------+---------------+---------+-----------+----------+------------------+ PFV  Full                                                            +---------+---------------+---------+-----------+----------+------------------+ POP      Full           Yes      Yes                  limited                                                                  visualization      +---------+---------------+---------+-----------+----------+------------------+ PTV      Full                                                            +---------+---------------+---------+-----------+----------+------------------+ PERO                                                  Not visualized     +---------+---------------+---------+-----------+----------+------------------+    Summary: Right: There is no evidence of deep vein thrombosis in the lower extremity. However, portions of this examination were limited- see technologist comments above. No cystic structure found in the popliteal fossa. Left: There is no evidence of deep vein thrombosis in the lower extremity. However, portions of this examination were limited- see technologist comments above. No cystic structure found in the popliteal fossa.  *See table(s) above for measurements and observations. Electronically signed by Harold Barban MD on 12/12/2018 at 6:49:32 PM.    Final    Recent Labs    12/12/18 0507 12/13/18 1014  WBC 11.7* 15.2*  HGB 9.8* 9.8*  HCT 28.0*  27.9*  PLT 216 274   Recent Labs    12/12/18 0507 12/13/18 1014  NA 136 137  K 3.7 3.7  CL 107 105  CO2 20* 22  GLUCOSE 146* 118*  BUN 16 24*  CREATININE 1.07 1.13  CALCIUM 8.4* 8.5*    Physical Exam: BP (!) 142/70 (BP Location: Left Arm)   Pulse 84   Temp 99.8 F (37.7 C) (Oral)   Resp (!) 21   SpO2 92%  Constitutional: No distress . Vital signs reviewed. HENT: Normocephalic.  Atraumatic. Eyes: EOMI. No discharge. Cardiovascular: RRR. No JVD.  + Murmur. Respiratory: CTA bilaterally. Normal effort. GI: BS +.  Distended. Musculoskeletal: Edema and tenderness in left hip  Neurological:Alert Patient is alert.  Makes good eye contact Masked facies.  Sporadic jerking movements noted Chin tremor Dysarthria.  Motor: Bilateral UE 4+-5 proximal to distal, stable LLE: Limited by pain, hip flexion, knee extension 2-/5, ankle dorsiflexion 3/5, stable RLE: 4/5 proximal to distal Skin: Left hip with dressing C/C/I Psychiatric: Confused, tangential  Assessment/Plan: 1. Functional deficits secondary to left hip  fracture with history of Parkinson's disease which require 3+ hours per day of interdisciplinary therapy in a comprehensive inpatient rehab setting.  Physiatrist is providing close team supervision and 24 hour management of active medical problems listed below.  Physiatrist and rehab team continue to assess barriers to discharge/monitor patient progress toward functional and medical goals  Care Tool:  Bathing    Body parts bathed by patient: Right arm, Left arm, Chest, Abdomen, Face   Body parts bathed by helper: Front perineal area, Buttocks, Left lower leg, Right lower leg, Left upper leg, Right upper leg     Bathing assist Assist Level: Total Assistance - Patient < 25%     Upper Body Dressing/Undressing Upper body dressing   What is the patient wearing?: Pull over shirt    Upper body assist Assist Level: Total Assistance - Patient < 25%    Lower Body  Dressing/Undressing Lower body dressing            Lower body assist Assist for lower body dressing: Dependent - Patient 0%     Toileting Toileting Toileting Activity did not occur (Clothing management and hygiene only): Safety/medical concerns  Toileting assist Assist for toileting: 2 Helpers     Transfers Chair/bed transfer  Transfers assist  Chair/bed transfer activity did not occur: Safety/medical concerns  Chair/bed transfer assist level: 2 Helpers     Locomotion Ambulation   Ambulation assist   Ambulation activity did not occur: Safety/medical concerns          Walk 10 feet activity   Assist  Walk 10 feet activity did not occur: Safety/medical concerns        Walk 50 feet activity   Assist Walk 50 feet with 2 turns activity did not occur: Safety/medical concerns         Walk 150 feet activity   Assist Walk 150 feet activity did not occur: Safety/medical concerns         Walk 10 feet on uneven surface  activity   Assist Walk 10 feet on uneven surfaces activity did not occur: Safety/medical concerns         Wheelchair     Assist   Type of Wheelchair: Manual    Wheelchair assist level: Dependent - Patient 0% Max wheelchair distance: 100    Wheelchair 50 feet with 2 turns activity    Assist        Assist Level: Dependent - Patient 0%   Wheelchair 150 feet activity     Assist     Assist Level: Dependent - Patient 0%      Medical Problem List and Plan: 1.Decreased functional mobilitysecondary to Left femoral neck fracture. 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 Weightbearing as tolerated  Continue CIR 2. Antithrombotics: -DVT/anticoagulation:Subcutaneous Lovenox. vascular study negative for DVT -antiplatelet therapy: N/A 3. Pain Management:  Will schedule tylenol  Will d/c Oxy and Robaxin 4. Mood:Wellbutrin 150 mg daily and 300 mg daily at  bedtime  Celexa 40 mg daily, decreased to 20 mg on 3/11 due with prolonged QT  Provide emotional support  Will consider Klonopin -antipsychotic agents: see above 5. Neuropsych: This patientisnot fully capable of making decisions on hisown behalf. 6. Skin/Wound Care:Routine skin checks 7. Fluids/Electrolytes/Nutrition:Routine in and out's   BMP within acceptable range on 3/11  D3 thin diet, advance diet as tolerated 8. Acute blood loss anemia.   Hemoglobin 9.8 on 3/12  Continue to monitor 9. Parkinson disease. Sinemet 25-100 1-2 tablets 3 times a  day,Comtan 200 mg 3 times a day,Requip 1 mg 3 times a day,  Seen by Neurology- trial Ativan without results, appreciate recs 10. Chronic idiopathic constipation. Linzess290 mcg daily 11. Overactive bladder. Toviaz 4 mg daily 12.  Hyperglycemia  Monitor with increased mobility 13.  Leukocytosis  WBCs 15.2 on 3/12  Labs ordered for tomorrow  Labs ordered for Monday  Patient now with jerking movements, disorientation, hallucinations  UA relatively unremarkable, urine culture pending  Ammonia ordered  Chest x-ray reviewed, equivocal, repeat x-ray ordered for today  Afebrile  Ammonia within normal limits  Continue to monitor 14.  Hypoalbuminemia  Supplement initiated on 3/11  15.  Labile blood pressure  Labile on 3/13  LOS: 3 days A FACE TO FACE EVALUATION WAS PERFORMED  Tahir Blank Lorie Phenix 12/14/2018, 9:06 AM

## 2018-12-15 ENCOUNTER — Inpatient Hospital Stay (HOSPITAL_COMMUNITY): Payer: Self-pay | Admitting: Occupational Therapy

## 2018-12-15 ENCOUNTER — Inpatient Hospital Stay (HOSPITAL_COMMUNITY): Payer: Self-pay | Admitting: Speech Pathology

## 2018-12-15 ENCOUNTER — Inpatient Hospital Stay (HOSPITAL_COMMUNITY): Payer: Self-pay | Admitting: Physical Therapy

## 2018-12-15 DIAGNOSIS — R400 Somnolence: Secondary | ICD-10-CM

## 2018-12-15 DIAGNOSIS — R1312 Dysphagia, oropharyngeal phase: Secondary | ICD-10-CM

## 2018-12-15 LAB — CBC WITH DIFFERENTIAL/PLATELET
Abs Immature Granulocytes: 0.3 10*3/uL — ABNORMAL HIGH (ref 0.00–0.07)
Basophils Absolute: 0 10*3/uL (ref 0.0–0.1)
Basophils Relative: 0 %
Eosinophils Absolute: 0.4 10*3/uL (ref 0.0–0.5)
Eosinophils Relative: 3 %
HCT: 28.2 % — ABNORMAL LOW (ref 39.0–52.0)
Hemoglobin: 9.1 g/dL — ABNORMAL LOW (ref 13.0–17.0)
Immature Granulocytes: 2 %
Lymphocytes Relative: 12 %
Lymphs Abs: 1.5 10*3/uL (ref 0.7–4.0)
MCH: 29.4 pg (ref 26.0–34.0)
MCHC: 32.3 g/dL (ref 30.0–36.0)
MCV: 91.3 fL (ref 80.0–100.0)
MONOS PCT: 9 %
Monocytes Absolute: 1.2 10*3/uL — ABNORMAL HIGH (ref 0.1–1.0)
NEUTROS PCT: 74 %
Neutro Abs: 9.2 10*3/uL — ABNORMAL HIGH (ref 1.7–7.7)
Platelets: 312 10*3/uL (ref 150–400)
RBC: 3.09 MIL/uL — ABNORMAL LOW (ref 4.22–5.81)
RDW: 12.9 % (ref 11.5–15.5)
WBC: 12.6 10*3/uL — ABNORMAL HIGH (ref 4.0–10.5)
nRBC: 0 % (ref 0.0–0.2)

## 2018-12-15 NOTE — Progress Notes (Signed)
Speech Language Pathology Daily Session Note  Patient Details  Name: [name] [name] MRN: [id number] Date of Birth: March 27, 1951  Today's Date: 12/15/2018 SLP Individual Time: 0930-1000 SLP Individual Time Calculation (min): 30 min  Short Term Goals: Week 1: SLP Short Term Goal 1 (Week 1): Pt will utilize external aids to orient to place, date, and situation with max assist multimodal cues.   SLP Short Term Goal 2 (Week 1): Pt will sustain his attention to basic, familiar tasks for 2 minute intervals with max cues for redirection.   SLP Short Term Goal 3 (Week 1): Pt will recall basic, daily information with max assist for use of memory compensatory aids.   SLP Short Term Goal 4 (Week 1): Patient will consume current diet with minimal overt s/s of aspiration with Mod A verbal cues for use of swallowing compensatory strategies.   Skilled Therapeutic Interventions:  SLP recieved pt semi-reclined in bed looking thru blue bag. Pt perseverative on looking thru items in bag. Pt with nonsepcifc answers as to items he was looking to obtain. With verbal cues, pt able to decide to retrieve his iPods (4 of them). Pt very self-directive and often resistant to theraputic cues. For example, SLP requested pt demonstrate/sequence how to operate iPod. Pt states he" will in a minute but I need that table first." Then he requires Max A to problem solve that battery is dead when he continues to press "on" button. Pt continued to state that he didn't like bed being raised 2 inches up for the bedside table to placed, he didn't like sitting any further up than being semi-reclined. At end of session, pt requested ice chips. SLP provided education that pt was not positioned safely for PO consumption. With some discussion and resistance by pt, he agreed to sit upright and consumed 3 boluses of ice chips without overt s/s of aspiration. Pt left upright in bed, bed alarm on and all needs within reach as well as tele-sitter  present. Continue per current plan of care.       Pain Pain Assessment Pain Scale: 0-10 Pain Score: 0-No pain  Therapy/Group: Individual Therapy  Cassie Henkels 12/15/2018, 3:53 PM

## 2018-12-15 NOTE — Progress Notes (Signed)
[place] PHYSICAL MEDICINE & REHABILITATION PROGRESS NOTE  Subjective/Complaints: Pt asleep when I arrived. Had eaten some breakfast. Occasional twitching.   ROS: Limited due to cognitive/behavioral   Objective: Vital Signs: Blood pressure (!) 120/92, pulse 80, temperature 98 F (36.7 C), resp. rate 20, SpO2 98 %. Dg Chest 2 View  Result Date: 12/14/2018 CLINICAL DATA:  Altered mental status EXAM: CHEST - 2 VIEW COMPARISON:  12/13/2018 FINDINGS: Poor inspiration. Heart size upper limits of normal. Mild volume loss at the left base, probably secondary to the poor inspiration. No consolidation or lobar collapse. No visible effusion. IMPRESSION: Poor inspiration. Mild volume loss left base, probably secondary to the poor inspiration. Electronically Signed   By: Nelson Chimes M.D.   On: 12/14/2018 10:28   Dg Chest 2 View  Result Date: 12/13/2018 CLINICAL DATA:  Altered mental status. EXAM: CHEST - 2 VIEW COMPARISON:  None. FINDINGS: The heart size is exaggerated by low lung volumes. Asymmetric left basilar airspace disease is present. No edema or effusion is present. No other focal airspace disease is evident. IMPRESSION: 1. Asymmetric left basilar airspace disease likely reflects atelectasis. Infection or aspiration is not excluded. Electronically Signed   By: San Morelle M.D.   On: 12/13/2018 16:02   Recent Labs    12/13/18 1014 12/15/18 0620  WBC 15.2* 12.6*  HGB 9.8* 9.1*  HCT 27.9* 28.2*  PLT 274 312   Recent Labs    12/13/18 1014  NA 137  K 3.7  CL 105  CO2 22  GLUCOSE 118*  BUN 24*  CREATININE 1.13  CALCIUM 8.5*    Physical Exam: BP (!) 120/92 (BP Location: Right Arm)   Pulse 80   Temp 98 F (36.7 C)   Resp 20   SpO2 98%  Constitutional: No distress . Vital signs reviewed. HEENT: EOMI, oral membranes moist Neck: supple Cardiovascular: RRR with murmur. No JVD    Respiratory: CTA Bilaterally without wheezes or rales. Normal effort    GI: BS +,  non-tender, distended  Musculoskeletal: Edema and tenderness in left hip  Neurological:sleepy    Masked facies.  Sporadic jerking movements persists Chin tremor Dysarthria.  Motor: Bilateral UE 4+-5 proximal to distal, stable LLE: remains Limited by pain, hip flexion, knee extension 2-/5, ankle dorsiflexion 3/5, stable RLE: 4/5 proximal to distal Skin: Left hip with dressing C/C/I Psychiatric: tangential, confused  Assessment/Plan: 1. Functional deficits secondary to left hip fracture with history of Parkinson's disease which require 3+ hours per day of interdisciplinary therapy in a comprehensive inpatient rehab setting.  Physiatrist is providing close team supervision and 24 hour management of active medical problems listed below.  Physiatrist and rehab team continue to assess barriers to discharge/monitor patient progress toward functional and medical goals  Care Tool:  Bathing    Body parts bathed by patient: Right arm, Left arm, Chest, Abdomen, Face   Body parts bathed by helper: Front perineal area, Buttocks, Left lower leg, Right lower leg, Left upper leg, Right upper leg     Bathing assist Assist Level: Total Assistance - Patient < 25%     Upper Body Dressing/Undressing Upper body dressing   What is the patient wearing?: Pull over shirt    Upper body assist Assist Level: Total Assistance - Patient < 25%    Lower Body Dressing/Undressing Lower body dressing      What is the patient wearing?: Pants     Lower body assist Assist for lower body dressing: 2 Helpers(bed level)  Toileting Toileting Toileting Activity did not occur Landscape architect and hygiene only): Safety/medical concerns  Toileting assist Assist for toileting: 2 Helpers     Transfers Chair/bed transfer  Transfers assist  Chair/bed transfer activity did not occur: Safety/medical concerns  Chair/bed transfer assist level: 2 Helpers     Locomotion Ambulation   Ambulation  assist   Ambulation activity did not occur: Safety/medical concerns          Walk 10 feet activity   Assist  Walk 10 feet activity did not occur: Safety/medical concerns        Walk 50 feet activity   Assist Walk 50 feet with 2 turns activity did not occur: Safety/medical concerns         Walk 150 feet activity   Assist Walk 150 feet activity did not occur: Safety/medical concerns         Walk 10 feet on uneven surface  activity   Assist Walk 10 feet on uneven surfaces activity did not occur: Safety/medical concerns         Wheelchair     Assist   Type of Wheelchair: Manual    Wheelchair assist level: Dependent - Patient 0% Max wheelchair distance: 100    Wheelchair 50 feet with 2 turns activity    Assist        Assist Level: Dependent - Patient 0%   Wheelchair 150 feet activity     Assist     Assist Level: Dependent - Patient 0%      Medical Problem List and Plan: 1.Decreased functional mobilitysecondary to Left femoral neck fracture. 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 Weightbearing as tolerated  Continue CIR 2. Antithrombotics: -DVT/anticoagulation:Subcutaneous Lovenox. vascular study negative for DVT -antiplatelet therapy: N/A 3. Pain Management:  Will schedule tylenol  Have stopped Oxy and Robaxin due to lethargy 4. Mood:Wellbutrin 150 mg daily and 300 mg daily at bedtime  Celexa 40 mg daily, decreased to 20 mg on 3/11 due with prolonged QT  Provide emotional support   consider Klonopin but would like to observe given sedating effects -antipsychotic agents: see above 5. Neuropsych: This patientisnot fully capable of making decisions on hisown behalf. 6. Skin/Wound Care:Routine skin checks 7. Fluids/Electrolytes/Nutrition:Routine in and out's   BMP within acceptable range on 3/11  D3 thin diet, advance diet as tolerated 8. Acute blood  loss anemia.   Hemoglobin 9.8 on 3/12  Continue to monitor 9. Parkinson disease. Sinemet 25-100 1-2 tablets 3 times a day,Comtan 200 mg 3 times a day,Requip 1 mg 3 times a day,  Seen by Neurology- trial Ativan without results, appreciate recs 10. Chronic idiopathic constipation. Linzess290 mcg daily 11. Overactive bladder. Toviaz 4 mg daily 12.  Hyperglycemia  Monitor with increased mobility 13.  Leukocytosis  WBCs 15.2 on 3/12---12.6 3/14  Labs ordered for Monday  Patient now with jerking movements, disorientation, hallucinations  UA relatively unremarkable, urine culture pending  Ammonia ordered  Chest x-ray reviewed and without acute changes  Afebrile  Ammonia within normal limits  No changes to plan today 14.  Hypoalbuminemia  Supplement initiated on 3/11  15.  Labile blood pressure  Labile on 3/13  LOS: 4 days A FACE TO FACE EVALUATION WAS PERFORMED  Meredith Staggers 12/15/2018, 10:06 AM

## 2018-12-15 NOTE — Progress Notes (Signed)
Occupational Therapy Session Note  Patient Details  Name: [name] [name] MRN: [id number] Date of Birth: 19-Feb-1951  Today's Date: 12/15/2018 OT Individual Time: 6286-3817 (60 mins)      Short Term Goals: Week 1:  OT Short Term Goal 1 (Week 1): Pt will complete LB bathing with mod assist sit to stand. OT Short Term Goal 2 (Week 1): Pt will complete LB dressing with mod assist using AE PRN. OT Short Term Goal 3 (Week 1): Pt will complete UB dressing with min assist 2 consecutive sessions. OT Short Term Goal 4 (Week 1): Pt will perform stand pivot transfer to the 3:1 with mod assist using the RW for support.   Skilled Therapeutic Interventions/Progress Updates:    Treatment session with focus on bed mobility and arousal to engage in self-care tasks.  Pt received supine in bed reporting uncomfortable.  Engaged in mobility at bed level to increase arousal and participation.  Pt with difficulty maintaining arousal throughout session.  Therapist assisted pt with drinking coke (per pt request), by holding cup and placing straw in mouth after pt attempted to hold cup but could not due to shakiness/tremors in BUE.  Engaged in dressing at bed level.  Pt required +2 for rolling due to pain and decreased participation due to impaired arousal.   Therapy Documentation Precautions:  Precautions Precautions: Fall Precaution Comments: history of parkinson's Restrictions Weight Bearing Restrictions: No LLE Weight Bearing: Weight bearing as tolerated General:   Vital Signs: Therapy Vitals Pulse Rate: 78 Resp: 18 BP: 121/77 Patient Position (if appropriate): Sitting Oxygen Therapy SpO2: 94 % O2 Device: Room Air Pain:  Pt with c/o pain in BLE, repositioned.   Therapy/Group: Individual Therapy  Simonne Come 12/15/2018, 3:25 PM

## 2018-12-15 NOTE — Progress Notes (Signed)
Physical Therapy Session Note  Patient Details  Name: [name] [name] MRN: [id number] Date of Birth: 18-Sep-1951  Today's Date: 12/15/2018 PT Individual Time: 1000-1100 PT Individual Time Calculation (min): 60 min   Short Term Goals: Week 1:  PT Short Term Goal 1 (Week 1): Pt will perform bed mobility with max assist  PT Short Term Goal 2 (Week 1): Pt will perform bed<>transfers with mod assist  PT Short Term Goal 3 (Week 1): Pt will propell WC with min assist x 169f  PT Short Term Goal 4 (Week 1): Pt will ambulate 127fwith mod assist   Skilled Therapeutic Interventions/Progress Updates:   Pt received supine in bed and agreeable to PT. Pt slightly confused, but speech intelligibility and awareness greatly improved from previous day. Pt making automatic movements to turn off Ipod at start of session. Supine>sit transfer with total assist from semi reclined, and due to decreased desire perform transfer. Sitting balance EOB x 10 mintues with supervision assist assist from PT. No near LOB noted with BUE support on bed. Hand over hand assist to reach for grab bar on stedy. [facility] to [facility] assist to maintain balance in sitting on stedy. Pt transported to rehab gym, perseverating on needed Ice chips before doing any movement. SLP states Ice chips okay in upright sitting. Sit<>stand at parallel bars x 3 with 30 sec standing tolerance mod assist for transfers and min assist to maintain standing with BUE support. On third attempt pt performed 2 steps forward/backward with BLE and min-mod assist from PT for safety and coordination of LE movement. Pt performed sit<>stand at RW x 2 with mod-max asssist due to increased tremors and rigidty. Max-mod assist to prevent posterior LOB from PT. Patient returned to room and [facility] in reach and all needs met.           Therapy Documentation Precautions:  Precautions Precautions: Fall Precaution Comments: history of  parkinson's Restrictions Weight Bearing Restrictions: No LLE Weight Bearing: Weight bearing as tolerated Pain:   3/10 L hip with movemet. See MAR   Therapy/Group: Individual Therapy  AuLorie Phenix/14/2020, 11:03 AM

## 2018-12-16 ENCOUNTER — Inpatient Hospital Stay (HOSPITAL_COMMUNITY): Payer: Self-pay | Admitting: Speech Pathology

## 2018-12-16 NOTE — Progress Notes (Signed)
[place] PHYSICAL MEDICINE & REHABILITATION PROGRESS NOTE  Subjective/Complaints: Pt up in bed when I came in. "I want to get up.". says he slept pretty well. Denies pain, says it's getting better  ROS: Limited due to cognitive/behavioral   Objective: Vital Signs: Blood pressure (!) 146/72, pulse 71, temperature 98.2 F (36.8 C), resp. rate 18, SpO2 92 %. Dg Chest 2 View  Result Date: 12/14/2018 CLINICAL DATA:  Altered mental status EXAM: CHEST - 2 VIEW COMPARISON:  12/13/2018 FINDINGS: Poor inspiration. Heart size upper limits of normal. Mild volume loss at the left base, probably secondary to the poor inspiration. No consolidation or lobar collapse. No visible effusion. IMPRESSION: Poor inspiration. Mild volume loss left base, probably secondary to the poor inspiration. Electronically Signed   By: Nelson Chimes M.D.   On: 12/14/2018 10:28   Recent Labs    12/13/18 1014 12/15/18 0620  WBC 15.2* 12.6*  HGB 9.8* 9.1*  HCT 27.9* 28.2*  PLT 274 312   Recent Labs    12/13/18 1014  NA 137  K 3.7  CL 105  CO2 22  GLUCOSE 118*  BUN 24*  CREATININE 1.13  CALCIUM 8.5*    Physical Exam: BP (!) 146/72 (BP Location: Right Wrist)   Pulse 71   Temp 98.2 F (36.8 C)   Resp 18   SpO2 92%  Constitutional: No distress . Vital signs reviewed. HEENT: EOMI, oral membranes moist Neck: supple Cardiovascular: RRR without murmur. No JVD    Respiratory: CTA Bilaterally without wheezes or rales. Normal effort    GI: BS +, non-tender, non-distended  Musculoskeletal: Edema and tenderness in left hip  Neurological:more alert  Masked facies.  Sporadic jerking movements persists  significant dysarthria.  Motor: Bilateral UE 4+-5 proximal to distal, stable LLE: remains Limited by pain, hip flexion, knee extension 2-/5, ankle dorsiflexion 3/5, stable RLE: 4/5 proximal to distal Skin: Left hip with dressing c, no visible drainage Psychiatric: more focused today  Assessment/Plan: 1.  Functional deficits secondary to left hip fracture with history of Parkinson's disease which require 3+ hours per day of interdisciplinary therapy in a comprehensive inpatient rehab setting.  Physiatrist is providing close team supervision and 24 hour management of active medical problems listed below.  Physiatrist and rehab team continue to assess barriers to discharge/monitor patient progress toward functional and medical goals  Care Tool:  Bathing    Body parts bathed by patient: Right arm, Left arm, Chest, Abdomen, Face   Body parts bathed by helper: Front perineal area, Buttocks, Left lower leg, Right lower leg, Left upper leg, Right upper leg     Bathing assist Assist Level: Total Assistance - Patient < 25%     Upper Body Dressing/Undressing Upper body dressing   What is the patient wearing?: Pull over shirt    Upper body assist Assist Level: Total Assistance - Patient < 25%    Lower Body Dressing/Undressing Lower body dressing      What is the patient wearing?: Pants     Lower body assist Assist for lower body dressing: 2 Helpers(bed level)     Toileting Toileting Toileting Activity did not occur (Clothing management and hygiene only): Safety/medical concerns  Toileting assist Assist for toileting: 2 Helpers     Transfers Chair/bed transfer  Transfers assist  Chair/bed transfer activity did not occur: Safety/medical concerns  Chair/bed transfer assist level: 2 Helpers     Locomotion Ambulation   Ambulation assist   Ambulation activity did not occur: Safety/medical concerns  Walk 10 feet activity   Assist  Walk 10 feet activity did not occur: Safety/medical concerns        Walk 50 feet activity   Assist Walk 50 feet with 2 turns activity did not occur: Safety/medical concerns         Walk 150 feet activity   Assist Walk 150 feet activity did not occur: Safety/medical concerns         Walk 10 feet on uneven surface   activity   Assist Walk 10 feet on uneven surfaces activity did not occur: Safety/medical concerns         Wheelchair     Assist   Type of Wheelchair: Manual    Wheelchair assist level: Dependent - Patient 0% Max wheelchair distance: 100    Wheelchair 50 feet with 2 turns activity    Assist        Assist Level: Dependent - Patient 0%   Wheelchair 150 feet activity     Assist     Assist Level: Dependent - Patient 0%      Medical Problem List and Plan: 1.Decreased functional mobilitysecondary to Left femoral neck fracture. 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 Weightbearing as tolerated  Continue CIR 2. Antithrombotics: -DVT/anticoagulation:Subcutaneous Lovenox. vascular study negative for DVT -antiplatelet therapy: N/A 3. Pain Management:  scheduled tylenol, local ice/ heat  Have stopped Oxy and Robaxin due to lethargy 4. Mood:Wellbutrin 150 mg daily and 300 mg daily at bedtime  Celexa 40 mg daily, decreased to 20 mg on 3/11 due with prolonged QT  Provide emotional support   consider Klonopin but would probably avoid given sedating effects -antipsychotic agents: no antipsychotics given PD 5. Neuropsych: This patientisnot fully capable of making decisions on hisown behalf. 6. Skin/Wound Care:Routine skin checks 7. Fluids/Electrolytes/Nutrition:Routine in and out's   BMP within acceptable range on 3/11  D3 thin diet, advance diet as tolerated 8. Acute blood loss anemia.   Hemoglobin 9.8 on 3/12  Continue to monitor 9. Parkinson disease. Sinemet 25-100 1-2 tablets 3 times a day,Comtan 200 mg 3 times a day,Requip 1 mg 3 times a day,  Seen by Neurology- trial Ativan without results, appreciate recs 10. Chronic idiopathic constipation. Linzess290 mcg daily  -colace stopped per wife request 11. Overactive bladder. Toviaz 4 mg daily 12.  Hyperglycemia  Monitor with increased  mobility 13.  Leukocytosis  WBCs 15.2 on 3/12---12.6 3/14  Labs ordered for Monday  Patient  with jerking movements which may be a little better over weekend.  UA relatively unremarkable, urine culture pending  Ammonia ordered  Chest x-ray   without acute changes  Afebrile  Ammonia within normal limits  No changes in plan today 3/15 14.  Hypoalbuminemia  Supplement initiated on 3/11  15.  Labile blood pressure  Labile on 3/13  LOS: 5 days A FACE TO FACE EVALUATION WAS PERFORMED  Meredith Staggers 12/16/2018, 8:37 AM

## 2018-12-17 ENCOUNTER — Inpatient Hospital Stay (HOSPITAL_COMMUNITY): Payer: Self-pay | Admitting: Physical Therapy

## 2018-12-17 ENCOUNTER — Inpatient Hospital Stay (HOSPITAL_COMMUNITY): Payer: Self-pay | Admitting: Occupational Therapy

## 2018-12-17 ENCOUNTER — Ambulatory Visit: Payer: Medicare Other | Admitting: Psychology

## 2018-12-17 ENCOUNTER — Inpatient Hospital Stay (HOSPITAL_COMMUNITY): Payer: Self-pay | Admitting: Speech Pathology

## 2018-12-17 DIAGNOSIS — F332 Major depressive disorder, recurrent severe without psychotic features: Secondary | ICD-10-CM

## 2018-12-17 LAB — CBC WITH DIFFERENTIAL/PLATELET
Abs Immature Granulocytes: 1 10*3/uL — ABNORMAL HIGH (ref 0.00–0.07)
Band Neutrophils: 2 %
Basophils Absolute: 0 10*3/uL (ref 0.0–0.1)
Basophils Relative: 0 %
EOS PCT: 0 %
Eosinophils Absolute: 0 10*3/uL (ref 0.0–0.5)
HCT: 29.6 % — ABNORMAL LOW (ref 39.0–52.0)
Hemoglobin: 9.7 g/dL — ABNORMAL LOW (ref 13.0–17.0)
Lymphocytes Relative: 8 %
Lymphs Abs: 1 10*3/uL (ref 0.7–4.0)
MCH: 29.8 pg (ref 26.0–34.0)
MCHC: 32.8 g/dL (ref 30.0–36.0)
MCV: 91.1 fL (ref 80.0–100.0)
MONO ABS: 1.3 10*3/uL — AB (ref 0.1–1.0)
Metamyelocytes Relative: 3 %
Monocytes Relative: 11 %
Myelocytes: 3 %
Neutro Abs: 8.9 10*3/uL — ABNORMAL HIGH (ref 1.7–7.7)
Neutrophils Relative %: 71 %
Platelets: 379 10*3/uL (ref 150–400)
Promyelocytes Relative: 2 %
RBC: 3.25 MIL/uL — ABNORMAL LOW (ref 4.22–5.81)
RDW: 12.9 % (ref 11.5–15.5)
WBC: 12.2 10*3/uL — ABNORMAL HIGH (ref 4.0–10.5)
nRBC: 0 % (ref 0.0–0.2)

## 2018-12-17 NOTE — Progress Notes (Signed)
Physical Therapy Session Note  Patient Details  Name: [name] [name] MRN: [id number] Date of Birth: Mar 28, 1951  Today's Date: 12/17/2018 PT Individual Time: 6568-1275 PT Individual Time Calculation (min): 70 min   Short Term Goals: Week 1:  PT Short Term Goal 1 (Week 1): Pt will perform bed mobility with max assist  PT Short Term Goal 2 (Week 1): Pt will perform bed<>transfers with mod assist  PT Short Term Goal 3 (Week 1): Pt will propell WC with min assist x 125ft  PT Short Term Goal 4 (Week 1): Pt will ambulate 63ft with mod assist   Skilled Therapeutic Interventions/Progress Updates: Pt presented in bed with wife present agreeable to therapy. Per wife pt is more orientated and alert then last week. Pt expressed fear of pain, discussed with pt and wife that due to injury there will be pain associated with movement however will try to manage as best as possible. Nsg notified and pt received pain meds during session. With increased time pt performed supine to sit with modA with pt able to initiate RLE and move LLE with assist. Pt required mod multimodal cues for sequencing and increasing anterior wt shifting. At EOB pt performed STS in [facility] CGA with PTA providing total A for pulling shorts over knees. Pt then transferred to [place] chair and transported to rehab gym total A. At parallel bars pt performed STS x 3 CGA requiring tactile cues for increasing forefoot wt bearing to decrease posterior lean. Pt was able to perform lateral wt shifts at bars and step forwards with R/LLLE for wt shifting with fair tolerance. Pt then performed STS from TIS with initially modA fading to min. Pt required cues for hand placement and increasing anterior wt shifting. Pt performed well with verbal cues for "nose over toes". Pt noted to have posterior lean when returning to seated position but was able to control descent. Pt transported back to room at end of session and left with NT and wife as pt requesting use of  toilet for possible BM.      Therapy Documentation Precautions:  Precautions Precautions: Fall Precaution Comments: history of parkinson's Restrictions Weight Bearing Restrictions: No LLE Weight Bearing: Weight bearing as tolerated General:   Vital Signs: Therapy Vitals Temp: 98.5 F (36.9 C) Pulse Rate: 77 Resp: 16 BP: 114/72 Patient Position (if appropriate): Sitting Oxygen Therapy SpO2: 95 % O2 Device: Room Air   Therapy/Group: Individual Therapy  Rick Warnick  Granger Chui, PTA  12/17/2018, 2:55 PM

## 2018-12-17 NOTE — Progress Notes (Signed)
Occupational Therapy Session Note  Patient Details  Name: [name] [name] MRN: [id number] Date of Birth: Jan 19, 1951  Today's Date: 12/17/2018 OT Individual Time: 1302-1401 OT Individual Time Calculation (min): 59 min    Short Term Goals: Week 1:  OT Short Term Goal 1 (Week 1): Pt will complete LB bathing with mod assist sit to stand. OT Short Term Goal 2 (Week 1): Pt will complete LB dressing with mod assist using AE PRN. OT Short Term Goal 3 (Week 1): Pt will complete UB dressing with min assist 2 consecutive sessions. OT Short Term Goal 4 (Week 1): Pt will perform stand pivot transfer to the 3:1 with mod assist using the RW for support.   Skilled Therapeutic Interventions/Progress Updates:    Pt completed bathing and dressing during session.  Pt able to follow one step commands with 90% accuracy during selfcare tasks.  He was able to utilize the [place] for transfer from the wheelchair at bedside to the shower seat.  Min assist for removing his pullover shirt with max assist to remove all LB clothing.  He was able to then complete showering with supervision for UB and max assist for LB secondary to decreased ability to complete sit to stand from the shower seat with use of the grab bar.  He could only achieve partial stand for 30 45 seconds with bilateral knees flexed.  After washing and drying off, he transferred out to the wheelchair with use of the [place] again for dressing tasks.  Mod assist for UB dressing with max assist for LB dressing.  He was able to complete sit to stand from the wheelchair with use of the RW and min assist as well as min assist when pulling up on the [place].  Mod demonstrational cueing for hand placement with sit to stand from the wheelchair.  Finished session with pt up in the wheelchair and spouse present as well.  Call button and phone in reach with safety alarm belt in place.    Therapy Documentation Precautions:  Precautions Precautions: Fall Precaution  Comments: history of parkinson's Restrictions Weight Bearing Restrictions: No LLE Weight Bearing: Weight bearing as tolerated  Pain: Pain Assessment Pain Scale: Faces Faces Pain Scale: Hurts a little bit Pain Type: Acute pain Pain Location: Hip Pain Orientation: Left Pain Descriptors / Indicators: Discomfort;Grimacing Pain Onset: With Activity Pain Intervention(s): Repositioned ADL: See Care Plan Section for some details of ADL  Therapy/Group: Individual Therapy  Gordon Carlson OTR/L 12/17/2018, 3:14 PM

## 2018-12-17 NOTE — Progress Notes (Signed)
Speech Language Pathology Daily Session Note  Patient Details  Name: [name] [name] MRN: [id number] Date of Birth: 13-Mar-1951  Today's Date: 12/17/2018 SLP Individual Time: 1200-1255 SLP Individual Time Calculation (min): 55 min  Short Term Goals: Week 1: SLP Short Term Goal 1 (Week 1): Pt will utilize external aids to orient to place, date, and situation with max assist multimodal cues.   SLP Short Term Goal 2 (Week 1): Pt will sustain his attention to basic, familiar tasks for 2 minute intervals with max cues for redirection.   SLP Short Term Goal 3 (Week 1): Pt will recall basic, daily information with max assist for use of memory compensatory aids.   SLP Short Term Goal 4 (Week 1): Patient will consume current diet with minimal overt s/s of aspiration with Mod A verbal cues for use of swallowing compensatory strategies.   Skilled Therapeutic Interventions: Skilled treatment session focused on dysphagia and cognitive goals. Upon arrival, patient was awake and upright in the wheelchair. Patient was awake and alert and appeared back to his cognitive baseline. Patient consumed trials of regular textures with efficient mastication and complete oral clearance without overt s/s of aspiration. Due to patient returning to cognitive baseline and consuming trials without overt s/s of aspiration, recommend patient upgrade to regular textures. patient's wife present and verbalized understanding. Also recommend patient consume all meals while upright in the wheelchair.  Patient able to sustain attention to self-feeding for ~20 minutes with supervision verbal cues. Patient left upright in wheelchair with all needs within reach and wife present. Continue with current plan of care.      Pain No/Denies Pain   Therapy/Group: Individual Therapy  Enriqueta Augusta 12/17/2018, 2:33 PM

## 2018-12-17 NOTE — Plan of Care (Signed)
  Problem: RH BOWEL ELIMINATION Goal: RH STG MANAGE BOWEL WITH ASSISTANCE Description STG Manage Bowel with min Assistance.  Outcome: Progressing   Problem: RH BLADDER ELIMINATION Goal: RH STG MANAGE BLADDER WITH ASSISTANCE Description STG Manage Bladder With min Assistance  Outcome: Progressing   Problem: RH SKIN INTEGRITY Goal: RH STG SKIN FREE OF INFECTION/BREAKDOWN Outcome: Progressing Goal: RH STG MAINTAIN SKIN INTEGRITY WITH ASSISTANCE Description STG Maintain Skin Integrity With min Assistance.  Outcome: Progressing   Problem: RH SAFETY Goal: RH STG ADHERE TO SAFETY PRECAUTIONS W/ASSISTANCE/DEVICE Description STG Adhere to Safety Precautions With min Assistance/Device.  Outcome: Progressing   Problem: RH PAIN MANAGEMENT Goal: RH STG PAIN MANAGED AT OR BELOW PT'S PAIN GOAL Description Less than 3 out of 10  Outcome: Progressing   Problem: RH KNOWLEDGE DEFICIT GENERAL Goal: RH STG INCREASE KNOWLEDGE OF SELF CARE AFTER HOSPITALIZATION Outcome: Progressing   Problem: Consults Goal: RH GENERAL PATIENT EDUCATION Description See Patient Education module for education specifics. Outcome: Progressing

## 2018-12-17 NOTE — Progress Notes (Signed)
[place] PHYSICAL MEDICINE & REHABILITATION PROGRESS NOTE  Subjective/Complaints: Patient seen sitting up in bed this morning.  He states he does not feel well.  He states he had a bad weekend.  When asked why he states that he is in the hospital he does not want to be here.  He states that we are forcing him to do things he does not want to do.  He is perseverative on having a regular textured diet.  Attempted to explain to patient precautions regarding dysphagia diet, however patient persistent about wanting to have normal texture and better tasting food.  ROS: Denies CP, shortness of breath, nausea, vomiting, diarrhea.  Objective: Vital Signs: Blood pressure 127/78, pulse 75, temperature 98.7 F (37.1 C), temperature source Oral, resp. rate 20, SpO2 92 %. No results found. Recent Labs    12/15/18 0620  WBC 12.6*  HGB 9.1*  HCT 28.2*  PLT 312   No results for input(s): NA, K, CL, CO2, GLUCOSE, BUN, CREATININE, CALCIUM in the last 72 hours.  Physical Exam: BP 127/78 (BP Location: Right Arm)   Pulse 75   Temp 98.7 F (37.1 C) (Oral)   Resp 20   SpO2 92%  Constitutional: No distress . Vital signs reviewed. HENT: Normocephalic.  Atraumatic. Eyes: EOMI. No discharge. Cardiovascular: RRR. No JVD. Respiratory: CTA Bilaterally. Normal effort. GI: BS +. Non-distended. Musculoskeletal: Edema and tenderness in left hip, improving Neurological:Alert and oriented x3 Masked facies.  Dysarthria.  Motor: Bilateral UE 5/5 proximal to distal LLE: remains Limited by pain, hip flexion, knee extension 2-/5, ankle dorsiflexion 4-/5 RLE: 4+/5 proximal to distal Skin: Left hip with dressing c/d/i Psychiatric: Perseverative.  Depressed.  See above.  Assessment/Plan: 1. Functional deficits secondary to left hip fracture with history of Parkinson's disease which require 3+ hours per day of interdisciplinary therapy in a comprehensive inpatient rehab setting.  Physiatrist is providing close  team supervision and 24 hour management of active medical problems listed below.  Physiatrist and rehab team continue to assess barriers to discharge/monitor patient progress toward functional and medical goals  Care Tool:  Bathing    Body parts bathed by patient: Right arm, Left arm, Chest, Abdomen, Face   Body parts bathed by helper: Front perineal area, Buttocks, Left lower leg, Right lower leg, Left upper leg, Right upper leg     Bathing assist Assist Level: Total Assistance - Patient < 25%     Upper Body Dressing/Undressing Upper body dressing   What is the patient wearing?: Pull over shirt    Upper body assist Assist Level: Total Assistance - Patient < 25%    Lower Body Dressing/Undressing Lower body dressing      What is the patient wearing?: Pants     Lower body assist Assist for lower body dressing: 2 Helpers(bed level)     Toileting Toileting Toileting Activity did not occur Landscape architect and hygiene only): Safety/medical concerns  Toileting assist Assist for toileting: 2 Helpers     Transfers Chair/bed transfer  Transfers assist  Chair/bed transfer activity did not occur: Safety/medical concerns  Chair/bed transfer assist level: 2 Helpers     Locomotion Ambulation   Ambulation assist   Ambulation activity did not occur: Safety/medical concerns          Walk 10 feet activity   Assist  Walk 10 feet activity did not occur: Safety/medical concerns        Walk 50 feet activity   Assist Walk 50 feet with 2 turns activity  did not occur: Safety/medical concerns         Walk 150 feet activity   Assist Walk 150 feet activity did not occur: Safety/medical concerns         Walk 10 feet on uneven surface  activity   Assist Walk 10 feet on uneven surfaces activity did not occur: Safety/medical concerns         Wheelchair     Assist   Type of Wheelchair: Manual    Wheelchair assist level: Dependent - Patient  0% Max wheelchair distance: 100    Wheelchair 50 feet with 2 turns activity    Assist        Assist Level: Dependent - Patient 0%   Wheelchair 150 feet activity     Assist     Assist Level: Dependent - Patient 0%      Medical Problem List and Plan: 1.Decreased functional mobilitysecondary to Left femoral neck fracture. 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 Weightbearing as tolerated  Continue CIR  Notes reviewed, labs reviewed 2. Antithrombotics: -DVT/anticoagulation:Subcutaneous Lovenox. vascular study negative for DVT -antiplatelet therapy: N/A 3. Pain Management:  Scheduled tylenol, local ice/ heat  Have stopped Oxy and Robaxin due to lethargy 4. Mood:Wellbutrin 150 mg daily and 300 mg daily at bedtime  Celexa 40 mg daily, decreased to 20 mg on 3/11 due with prolonged QT  Provide emotional support  Severely depressed, will ask neuropsych for eval  Antipsychotic agents: no antipsychotics given PD 5. Neuropsych: This patientisnot fully capable of making decisions on hisown behalf. 6. Skin/Wound Care:Routine skin checks 7. Fluids/Electrolytes/Nutrition:Routine in and out's   BMP within acceptable range on 3/11  D3 thin diet, advance diet as tolerated 8. Acute blood loss anemia.   Hemoglobin 9.8 on 3/12  Continue to monitor 9. Parkinson disease. Sinemet 25-100 1-2 tablets 3 times a day, Comtan 200 mg 3 times a day,Requip 1 mg 3 times a day,  Seen by Neurology- trial Ativan without results, appreciate recs 10. Chronic idiopathic constipation. Linzess290 mcg daily  -colace stopped per wife request 11. Overactive bladder. Toviaz 4 mg daily 12.  Hyperglycemia  Monitor with increased mobility 13.  Leukocytosis  WBCs 12.6 on 3/14  Labs pending  UA relatively unremarkable, urine culture NG  Ammonia WNL  Chest x-ray   without acute changes  Afebrile 14.  Hypoalbuminemia  Supplement initiated on  3/11  15.  Labile blood pressure  Controlled on 3/16  LOS: 6 days A FACE TO FACE EVALUATION WAS PERFORMED  Rennie Rouch Lorie Phenix 12/17/2018, 9:25 AM

## 2018-12-18 ENCOUNTER — Inpatient Hospital Stay (HOSPITAL_COMMUNITY): Payer: Self-pay | Admitting: Speech Pathology

## 2018-12-18 ENCOUNTER — Encounter (HOSPITAL_COMMUNITY): Payer: Self-pay | Admitting: Psychology

## 2018-12-18 ENCOUNTER — Inpatient Hospital Stay (HOSPITAL_COMMUNITY): Payer: Self-pay | Admitting: Physical Therapy

## 2018-12-18 ENCOUNTER — Inpatient Hospital Stay (HOSPITAL_COMMUNITY): Payer: Self-pay | Admitting: Occupational Therapy

## 2018-12-18 NOTE — Plan of Care (Signed)
  Problem: RH BOWEL ELIMINATION Goal: RH STG MANAGE BOWEL WITH ASSISTANCE Description STG Manage Bowel with min Assistance.  Outcome: Progressing   Problem: RH BLADDER ELIMINATION Goal: RH STG MANAGE BLADDER WITH ASSISTANCE Description STG Manage Bladder With min Assistance  Outcome: Progressing   Problem: RH SKIN INTEGRITY Goal: RH STG SKIN FREE OF INFECTION/BREAKDOWN Outcome: Progressing Goal: RH STG MAINTAIN SKIN INTEGRITY WITH ASSISTANCE Description STG Maintain Skin Integrity With min Assistance.  Outcome: Progressing   Problem: RH SAFETY Goal: RH STG ADHERE TO SAFETY PRECAUTIONS W/ASSISTANCE/DEVICE Description STG Adhere to Safety Precautions With min Assistance/Device.  Outcome: Progressing   Problem: RH PAIN MANAGEMENT Goal: RH STG PAIN MANAGED AT OR BELOW PT'S PAIN GOAL Description Less than 3 out of 10  Outcome: Progressing   Problem: RH KNOWLEDGE DEFICIT GENERAL Goal: RH STG INCREASE KNOWLEDGE OF SELF CARE AFTER HOSPITALIZATION Outcome: Progressing   Problem: Consults Goal: RH GENERAL PATIENT EDUCATION Description See Patient Education module for education specifics. Outcome: Progressing

## 2018-12-18 NOTE — Progress Notes (Signed)
PHYSICAL MEDICINE & REHABILITATION PROGRESS NOTE  Subjective/Complaints: Patient seen laying in bed this morning, about to work with SLP.  He states he slept fairly overnight because he did not want to be here  ROS: Denies CP, shortness of breath, nausea, vomiting, diarrhea.  Objective: Vital Signs: Blood pressure (!) 154/78, pulse 76, temperature 98.9 F (37.2 C), temperature source Oral, resp. rate 15, SpO2 93 %. No results found. Recent Labs    12/17/18 0915  WBC 12.2*  HGB 9.7*  HCT 29.6*  PLT 379   No results for input(s): NA, K, CL, CO2, GLUCOSE, BUN, CREATININE, CALCIUM in the last 72 hours.  Physical Exam: BP (!) 154/78 (BP Location: Left Arm)   Pulse 76   Temp 98.9 F (37.2 C) (Oral)   Resp 15   SpO2 93%  Constitutional: No distress . Vital signs reviewed. HENT: Normocephalic.  Atraumatic. Eyes: EOMI. No discharge. Cardiovascular: RRR.  No JVD. Respiratory: CTA bilaterally.  Normal effort. GI: BS +. Non-distended. Musculoskeletal: Edema and tenderness in left hip, improving Neurological:Alert and oriented Masked facies.  No rigidity noted Dysarthria.  Motor: Bilateral UE 5/5 proximal to distal LLE: remains Limited by pain, hip flexion, knee extension 2-/5, ankle dorsiflexion 4-/5, unchanged RLE: 4+/5 proximal to distal, unchanged skin: Left hip with dressing c/d/i Psychiatric: Depressed.  See above.  Assessment/Plan: 1. Functional deficits secondary to left hip fracture with history of Parkinson's disease which require 3+ hours per day of interdisciplinary therapy in a comprehensive inpatient rehab setting.  Physiatrist is providing close team supervision and 24 hour management of active medical problems listed below.  Physiatrist and rehab team continue to assess barriers to discharge/monitor patient progress toward functional and medical goals  Care Tool:  Bathing    Body parts bathed by patient: Right arm, Left arm, Chest, Abdomen,  Face, Front perineal area, Right upper leg, Left upper leg   Body parts bathed by helper: Right lower leg, Left lower leg, Buttocks     Bathing assist Assist Level: Moderate Assistance - Patient 50 - 74%     Upper Body Dressing/Undressing Upper body dressing   What is the patient wearing?: Pull over shirt    Upper body assist Assist Level: Moderate Assistance - Patient 50 - 74%    Lower Body Dressing/Undressing Lower body dressing      What is the patient wearing?: Pants     Lower body assist Assist for lower body dressing: Maximal Assistance - Patient 25 - 49%     Toileting Toileting Toileting Activity did not occur Landscape architect and hygiene only): Safety/medical concerns  Toileting assist Assist for toileting: 2 Helpers     Transfers Chair/bed transfer  Transfers assist  Chair/bed transfer activity did not occur: Safety/medical concerns  Chair/bed transfer assist level: 2 Helpers     Locomotion Ambulation   Ambulation assist   Ambulation activity did not occur: Safety/medical concerns          Walk 10 feet activity   Assist  Walk 10 feet activity did not occur: Safety/medical concerns        Walk 50 feet activity   Assist Walk 50 feet with 2 turns activity did not occur: Safety/medical concerns         Walk 150 feet activity   Assist Walk 150 feet activity did not occur: Safety/medical concerns         Walk 10 feet on uneven surface  activity   Assist Walk 10 feet on uneven surfaces  activity did not occur: Safety/medical concerns         Wheelchair     Assist   Type of Wheelchair: Manual    Wheelchair assist level: Dependent - Patient 0% Max wheelchair distance: 100    Wheelchair 50 feet with 2 turns activity    Assist        Assist Level: Dependent - Patient 0%   Wheelchair 150 feet activity     Assist     Assist Level: Dependent - Patient 0%      Medical Problem List and  Plan: 1.Decreased functional mobilitysecondary to Left femoral neck fracture. 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 Weightbearing as tolerated  Continue CIR 2. Antithrombotics: -DVT/anticoagulation:Subcutaneous Lovenox. vascular study negative for DVT -antiplatelet therapy: N/A 3. Pain Management:  Scheduled tylenol, local ice/ heat  Have stopped Oxy and Robaxin due to lethargy 4. Mood:Wellbutrin 150 mg daily and 300 mg daily at bedtime  Celexa 40 mg daily, decreased to 20 mg on 3/11 due with prolonged QT  Provide emotional support  Severely depressed, will ask neuropsych for eval  Antipsychotic agents: no antipsychotics given PD 5. Neuropsych: This patientisnot fully capable of making decisions on hisown behalf. 6. Skin/Wound Care:Routine skin checks 7. Fluids/Electrolytes/Nutrition:Routine in and out's   BMP within acceptable range on 3/11, labs ordered for tomorrow  P.o. intake has been variable  Advanced to regular texture thin liquids with supervision 8. Acute blood loss anemia.   Hemoglobin 9.7 on 3/16  Continue to monitor 9. Parkinson disease. Sinemet 25-100 1-2 tablets 3 times a day, Comtan 200 mg 3 times a day,Requip 1 mg 3 times a day,  Seen by Neurology- trial Ativan without results, appreciate recs 10. Chronic idiopathic constipation. Linzess290 mcg daily  -colace stopped per wife request 11. Overactive bladder. Toviaz 4 mg daily 12.  Hyperglycemia  Monitor with increased mobility 13.  Leukocytosis  WBCs 12.2 on 3/16  UA relatively unremarkable, urine culture NG  Ammonia WNL  Chest x-ray   without acute changes  Afebrile 14.  Hypoalbuminemia  Supplement initiated on 3/11  15.  Labile blood pressure  Labile on 3/17  LOS: 7 days A FACE TO FACE EVALUATION WAS PERFORMED  Ankit Lorie Phenix 12/18/2018, 8:29 AM

## 2018-12-18 NOTE — Progress Notes (Signed)
Physical Therapy Session Note  Patient Details  Name: [name] [name] MRN: [id number] Date of Birth: [birth year]  Today's Date: 12/18/2018 PT Individual Time: 1000-1040 PT Individual Time Calculation (min): 40 min   Short Term Goals: Week 1:  PT Short Term Goal 1 (Week 1): Pt will perform bed mobility with max assist  PT Short Term Goal 2 (Week 1): Pt will perform bed<>transfers with mod assist  PT Short Term Goal 3 (Week 1): Pt will propell WC with min assist x 165ft  PT Short Term Goal 4 (Week 1): Pt will ambulate 57ft with mod assist   Skilled Therapeutic Interventions/Progress Updates:   Pt in w/c and agreeable to therapy, denies pain at rest. Total assist w/c transport to/from therapy gym. Worked on sit<>stands and gait. Sit<>stand to RW w/ min assist and min assist to maintain static stance for 30 sec x3 bouts. Verbal and tactile cues for initiation, technique, and for balance strategies. Pt w/ posterior lean bias, cues for anterior weight shifting for balance. Ambulated 15' w/ min-mod assist overall, needed manual assist for RW management and manual cues for anterior weight shifting onto RW. 2nd helper provided close w/c follow for safety, however did not need to use it emergently. Pt ambulated at very slow gait speed, suspect 2/2 fear of movement. Able to increase step length w/ verbal/visual cues. Returned to room via w/c, ended session in w/c and all needs in reach.   Therapy Documentation Precautions:  Precautions Precautions: Fall Precaution Comments: history of parkinson's Restrictions Weight Bearing Restrictions: No LLE Weight Bearing: Weight bearing as tolerated  Therapy/Group: Individual Therapy  Tanisa Lagace K Josten [place] 12/18/2018, 10:40 AM

## 2018-12-18 NOTE — Progress Notes (Signed)
Occupational Therapy Weekly Progress Note  Patient Details  Name: [name] [name] MRN: [id number] Date of Birth: Feb 16, 1951  Beginning of progress report period: December 12, 2018 End of progress report period: December 18, 2018  Today's Date: 12/18/2018 OT Individual Time: 4360-6770 OT Individual Time Calculation (min): 33 min    Patient has met 2 of 4 short term goals.  Pt completes functional transfers currently at mod assist level.  He needs supervision for UB bathing with min assist for UB dressing tasks.  LB bathing is at a mod assist level with max assist for LB dressing.  He continues to demonstrate decreased LLE strength and and decreased active movement with mobility.  All tasks are completed at a slow rate of speed secondary to Parkinson's.  Max step by step cueing is needed for all transitional movements with regards to hand placement, walker sequencing,, and step sequencing. He has increased his ability to participate in session over the past week, with greater attention and improved cognition now that the anesthesia has worn off as well as the decreased use of pain meds.  Feel he is on target for overall min assist level goals for discharge.  Will continue with current OT POC with CIR level therapy until goals are achieved.    Patient continues to demonstrate the following deficits: muscle weakness and muscle joint tightness, impaired timing and sequencing, unbalanced muscle activation and decreased coordination, decreased initiation and delayed processing and decreased standing balance, decreased postural control and decreased balance strategies and therefore will continue to benefit from skilled OT intervention to enhance overall performance with BADL and Reduce care partner burden.  Patient progressing toward long term goals..  Continue plan of care.  OT Short Term Goals Week 2:  OT Short Term Goal 1 (Week 2): Pt will complete toilet transfers with min assist RW to 3:1 OT Short Term  Goal 2 (Week 2): Pt will complete LB dressing with mod assist sit to stand and AE PRN.  OT Short Term Goal 3 (Week 2): Pt will complete LB bathing with min assist sit to stand.   OT Short Term Goal 4 (Week 2): Pt will manage clothing with min assist sit to stand.    Skilled Therapeutic Interventions/Progress Updates:    Pt completed functional transfers from the tilt in space chair to the regular chair with mod assist, ambulating approximately 5 ft with use of the RW for support.  He needed max demonstrational cueing for sequencing walker, LLE, and then RLE.  He demonstrated flexed trunk and hip flexion in standing, requiring mod demonstrational cueing for upright posture as well.  Pt without pain when asked, but still with increased fear of falling.  Slow movements noted with all transitions and functional mobility.  Finish session with call button and phone in reach and pt's spouse present.  Safety belt in place as well.   Therapy Documentation Precautions:  Precautions Precautions: Fall Precaution Comments: history of parkinson's Restrictions Weight Bearing Restrictions: No LLE Weight Bearing: Weight bearing as tolerated   Pain: Pain Assessment Pain Score: 0-No pain ADL: See Care Tool Section for some details of ADLs  Therapy/Group: Individual Therapy  [name] OTR/L 12/18/2018, 4:15 PM

## 2018-12-18 NOTE — Progress Notes (Signed)
Speech Language Pathology Daily Session Note  Patient Details  Name: [name] [name] MRN: [id number] Date of Birth: 09/30/1951  Today's Date: 12/18/2018 SLP Individual Time: 0730-0825 SLP Individual Time Calculation (min): 55 min  Short Term Goals: Week 1: SLP Short Term Goal 1 (Week 1): Pt will utilize external aids to orient to place, date, and situation with max assist multimodal cues.   SLP Short Term Goal 2 (Week 1): Pt will sustain his attention to basic, familiar tasks for 2 minute intervals with max cues for redirection.   SLP Short Term Goal 3 (Week 1): Pt will recall basic, daily information with max assist for use of memory compensatory aids.   SLP Short Term Goal 4 (Week 1): Patient will consume current diet with minimal overt s/s of aspiration with Mod A verbal cues for use of swallowing compensatory strategies.   Skilled Therapeutic Interventions: Skilled treatment session focused on cognitive and dysphagia goals. SLP facilitated session by providing extra time and Mod A verbal cues for problem solving during transfer to the wheelchair via the [place]. Patient consumed breakfast meal of regular textures with thin liquids without overt s/s of aspiration with min A verbal cues needed for problem solving during tray set-up. Patient alternated attention between functional conversation and self-feeding for ~30 minutes with Min A verbal cues. Patient left upright in wheelchair with alarm on and all needs within reach. Continue with current plan of care.       Pain No/Denies Pain   Therapy/Group: Individual Therapy  Anea Fodera 12/18/2018, 3:24 PM

## 2018-12-18 NOTE — Consult Note (Signed)
Neuropsychological Consultation   Patient:   [name] [name]   DOB:   [birth year]  MR Number:  [id number]  Location:  [facility] [id number] [place] [id number] Dept: [place]: [phone]           Date of Service:   12/18/2018  Start Time:   2 PM End Time:   3 PM  Provider/Observer:  Ilean Skill, Psy.D.       Clinical Neuropsychologist       Billing Code/Service: [phone]  Chief Complaint:    [name] [name] is [name] 68 year old male with history of Parkinson's disease followed by neurology services by Dr. Carles [name].  Maintained on Sinemet, [name].  Major Depressive Disorder is being followed by Dr. Adele [name] and maintained on Wellbutrin and Celexa.  Presented on 12/08/2018 after fall while walking into garage.  Missed the last step.  Significant left hip pain.  Left femoral neck fracture.  Underwent total hip replacement by Dr. Erlinda [name] with anterior approach.  Patient was recommended for CIR program.  Patient had significant confusion and delirium following surgery that may be due to reactions to anesthesia in conjunction with medications and parkinson's.     Reason for Service:  JJO:ACZYSAY [name] [name] [name] ,[name] [name], [name] [name], [name] idiopathic constipation. Per chart review patient lives with supportive wife. Modified independent using [name] straight point cane occasionally. Two-level home with bedroom bath upstairs 8 steps to entry. Wife does assist with some basic ADLs. Presented 12/08/2018 after [name] fall while walking down into his garage. He states he missed the last step. He did not hit his head or black out. Patient noted significant left hip pain. X-rays  and imaging revealed left femoral neck fracture. Underwent total hip replacement uncemented 12/09/2018 per Dr.Xu with anterior approach. Hospital course pain management and weightbearing as tolerated left lower extremity. Acute blood loss anemia 9.3. subcutaneous Lovenox for DVT prophylaxis. Therapy evaluations completed with recommendations of physical medicine rehabilitation consult. Patient was admitted for [name] comprehensive rehabilitation program.  Current Status:  The patient and wife report significant improvement in confusion and delirium.  The patient was lethargic but oriented today.  Reported depressive symptoms, but this has been typical and patient is on both Wellbutrin and Celexa already. Wife was very concerned about the possibility of exacerbation of his Parkinson's disease post surgery and all of the difficulties he had.  However, she reports that now that medications are being given consistently and at the right timing and that he is further out from surgery that he is returning to baseline and his Parkinson symptoms have returned to baseline.  Behavioral Observation: [name] [name]  presents as [name] 68 y.o.-year-old Right Caucasian Male who appeared his stated age. his dress was Appropriate and he was Well Groomed and his manners were Appropriate to the situation.  his participation was indicative of Appropriate and Drowsy behaviors.  There were any physical disabilities noted.  he displayed an appropriate level of cooperation and motivation.     Interactions:    Active Appropriate and Drowsy  Attention:   abnormal and attention span appeared shorter than expected for age  Memory:   abnormal; remote memory intact, recent memory impaired  Visuo-spatial:  not examined  Speech (Volume):  low  Speech:   normal; slowed response time  Thought Process:  Coherent and Relevant  Though Content:  WNL; not suicidal and not homicidal  Orientation:   person, place, time/date and  situation  Judgment:   Fair  Planning:   Fair  Affect:    Depressed  Mood:    Dysphoric  Insight:   Fair  Intelligence:   high  Medical History:   Past Medical History:  Diagnosis Date  . APPENDECTOMY, HX OF 02/18/2008   Qualifier: Diagnosis of  By: [name] [name] CMA, [name] [name]    . Cardiac murmur    as [name] child  . [name] idiopathic constipation   . [name]   . Headache(784.0)   . HEADACHES, HX OF 02/18/2008   Qualifier: Diagnosis of  By: [name] [name] CMA, [name] [name]    . Parkinson disease ([place]) 11/2014  . Sinus mucosal thickening    pt unable to lay flat  . Streptococcal meningitis    as an infant       Psychiatric History:  The patient does have [name] history of significant major depressive disorder and is being followed by Dr. Adele [name] with behavioral healt through [facility] health  Family Med/Psych History:  Family History  Problem Relation Age of Onset  . Lung cancer Father   . Alcohol abuse Brother   . Drug abuse Brother   . Seizures Daughter   . Colon cancer Neg Hx   . Stomach cancer Neg Hx   . Rectal cancer Neg Hx   . Esophageal cancer Neg Hx     Risk of Suicide/Violence: low patient denies any suicidal or homicidal ideation.  Impression/DX:  [name] [name] is [name] 68 year old male with history of Parkinson's disease followed by neurology services by Dr. Carles [name].  Maintained on Sinemet, [name].  Major Depressive Disorder is being followed by Dr. Adele [name] and maintained on Wellbutrin and Celexa.  Presented on 12/08/2018 after fall while walking into garage.  Missed the last step.  Significant left hip pain.  Left femoral neck fracture.  Underwent total hip replacement by Dr. Erlinda [name] with anterior approach.  Patient was recommended for CIR program.  Patient had significant confusion and delirium following surgery that may be due to reactions to anesthesia in conjunction with medications and parkinson's.     The patient and wife report significant improvement in confusion and delirium.  The patient was  lethargic but oriented today.  Reported depressive symptoms, but this has been typical and patient is on both Wellbutrin and Celexa already. Wife was very concerned about the possibility of exacerbation of his Parkinson's disease post surgery and all of the difficulties he had.  However, she reports that now that medications are being given consistently and at the right timing and that he is further out from surgery that he is returning to baseline and his Parkinson symptoms have returned to baseline.   Disposition/Plan:  Will see the patient again if needed next week as there does appear to be [name] lot of distress by the patient's wife in particular and is likely that it would benefit both the patient and his wife for more face-to-face interactions with treating doctors.        Electronically Signed   _______________________ Ilean Skill, Psy.D.

## 2018-12-18 NOTE — Progress Notes (Signed)
Occupational Therapy Session Note  Patient Details  Name: [name] MRN: [id number] Date of Birth: 1951/09/18  Today's Date: 12/18/2018 OT Individual Time: 1101-1200 OT Individual Time Calculation (min): 59 min    Short Term Goals: Week 1:  OT Short Term Goal 1 (Week 1): Pt will complete LB bathing with mod assist sit to stand. OT Short Term Goal 2 (Week 1): Pt will complete LB dressing with mod assist using AE PRN. OT Short Term Goal 3 (Week 1): Pt will complete UB dressing with min assist 2 consecutive sessions. OT Short Term Goal 4 (Week 1): Pt will perform stand pivot transfer to the 3:1 with mod assist using the RW for support.   Skilled Therapeutic Interventions/Progress Updates:    Pt completed bathing and dressing during session.  Charlaine Dalton was utilized for transfer from the wheelchair to the shower seat.  He was able to complete bathing with increased time and mod assist sit to stand.  Movements for bathing are slow secondary to Parkinson's.  He needed assistance for washing and drying his lower legs and feet.  Stedy utilized for transfer to the wheelchair after completion of shower.  Min assist with donning pullover shirt with max assist for all LB dressing sit to stand.  Finished session with call button and phone in reach and pt tilted back in the wheelchair with call button and phone in reach.    Therapy Documentation Precautions:  Precautions Precautions: Fall Precaution Comments: history of parkinson's Restrictions Weight Bearing Restrictions: No LLE Weight Bearing: Weight bearing as tolerated    Vital Signs: Therapy Vitals Temp: 98.4 F (36.9 C) Temp Source: Oral Pulse Rate: 73 Resp: 18 BP: 115/67 Oxygen Therapy SpO2: 98 % O2 Device: Room Air Pain:  No report of pain  ADL: See Care Tool for some details of ADLs  Therapy/Group: Individual Therapy  Meria Crilly OTR/L 12/18/2018, 3:56 PM

## 2018-12-19 ENCOUNTER — Inpatient Hospital Stay (HOSPITAL_COMMUNITY): Payer: Self-pay

## 2018-12-19 ENCOUNTER — Inpatient Hospital Stay (HOSPITAL_COMMUNITY): Payer: Self-pay | Admitting: Speech Pathology

## 2018-12-19 DIAGNOSIS — F331 Major depressive disorder, recurrent, moderate: Secondary | ICD-10-CM

## 2018-12-19 LAB — BASIC METABOLIC PANEL
Anion gap: 4 — ABNORMAL LOW (ref 5–15)
BUN: 16 mg/dL (ref 8–23)
CO2: 26 mmol/L (ref 22–32)
Calcium: 8.5 mg/dL — ABNORMAL LOW (ref 8.9–10.3)
Chloride: 105 mmol/L (ref 98–111)
Creatinine, Ser: 1.09 mg/dL (ref 0.61–1.24)
GFR calc non Af Amer: >60 mL/min (ref >60)
Glucose, Bld: 107 mg/dL — ABNORMAL HIGH (ref 70–99)
Potassium: 3.8 mmol/L (ref 3.5–5.1)
SODIUM: 135 mmol/L (ref 135–145)

## 2018-12-19 NOTE — Progress Notes (Signed)
[place] PHYSICAL MEDICINE & REHABILITATION PROGRESS NOTE  Subjective/Complaints: Patient seen sitting up in his chair eating breakfast this morning.  He states he slept well overnight.  He has questions about his progress.  ROS: Denies CP, shortness of breath, nausea, vomiting, diarrhea.  Objective: Vital Signs: Blood pressure 129/79, pulse 80, temperature 97.8 F (36.6 C), temperature source Oral, resp. rate 19, weight (!) 140 kg, SpO2 99 %. No results found. Recent Labs    12/17/18 0915  WBC 12.2*  HGB 9.7*  HCT 29.6*  PLT 379   Recent Labs    12/19/18 0454  NA 135  K 3.8  CL 105  CO2 26  GLUCOSE 107*  BUN 16  CREATININE 1.09  CALCIUM 8.5*    Physical Exam: BP 129/79 (BP Location: Left Arm)   Pulse 80   Temp 97.8 F (36.6 C) (Oral)   Resp 19   Wt (!) 140 kg   SpO2 99%   BMI 39.63 kg/m  Constitutional: No distress . Vital signs reviewed. HENT: Normocephalic.  Atraumatic. Eyes: EOMI. No discharge. Cardiovascular: RRR.  No JVD. Respiratory: CTA bilaterally.  Normal effort. GI: BS +. Non-distended. Musculoskeletal: Edema and tenderness in left hip, improving Neurological:Alert and oriented Mask facies.  Dysarthria.  Motor:  LLE: remains Limited by pain, hip flexion, knee extension 2-/5, ankle dorsiflexion 4-/5, stable RLE: 4+/5 proximal to distal, stable Skin: Left hip with dressing c/d/i Psychiatric: Depressed.  See above.  Assessment/Plan: 1. Functional deficits secondary to left hip fracture with history of Parkinson's disease which require 3+ hours per day of interdisciplinary therapy in a comprehensive inpatient rehab setting.  Physiatrist is providing close team supervision and 24 hour management of active medical problems listed below.  Physiatrist and rehab team continue to assess barriers to discharge/monitor patient progress toward functional and medical goals  Care Tool:  Bathing    Body parts bathed by patient: Right arm, Left arm,  Chest, Abdomen, Face, Front perineal area, Right upper leg, Left upper leg   Body parts bathed by helper: Right lower leg, Left lower leg, Buttocks     Bathing assist Assist Level: Moderate Assistance - Patient 50 - 74%     Upper Body Dressing/Undressing Upper body dressing   What is the patient wearing?: Pull over shirt    Upper body assist Assist Level: Minimal Assistance - Patient > 75%    Lower Body Dressing/Undressing Lower body dressing      What is the patient wearing?: Pants     Lower body assist Assist for lower body dressing: Maximal Assistance - Patient 25 - 49%     Toileting Toileting Toileting Activity did not occur (Clothing management and hygiene only): Safety/medical concerns  Toileting assist Assist for toileting: 2 Helpers     Transfers Chair/bed transfer  Transfers assist  Chair/bed transfer activity did not occur: Safety/medical concerns  Chair/bed transfer assist level: Moderate Assistance - Patient 50 - 74%     Locomotion Ambulation   Ambulation assist   Ambulation activity did not occur: Safety/medical concerns  Assist level: 2 helpers(mod assist x2) Assistive device: Walker-rolling Max distance: 15'   Walk 10 feet activity   Assist  Walk 10 feet activity did not occur: Safety/medical concerns  Assist level: Moderate Assistance - Patient - 50 - 74% Assistive device: Walker-rolling   Walk 50 feet activity   Assist Walk 50 feet with 2 turns activity did not occur: Safety/medical concerns         Walk 150 feet  activity   Assist Walk 150 feet activity did not occur: Safety/medical concerns         Walk 10 feet on uneven surface  activity   Assist Walk 10 feet on uneven surfaces activity did not occur: Safety/medical concerns         Wheelchair     Assist   Type of Wheelchair: Manual    Wheelchair assist level: Dependent - Patient 0% Max wheelchair distance: 100    Wheelchair 50 feet with 2 turns  activity    Assist        Assist Level: Dependent - Patient 0%   Wheelchair 150 feet activity     Assist     Assist Level: Dependent - Patient 0%      Medical Problem List and Plan: 1.Decreased functional mobilitysecondary to Left femoral neck fracture. 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 Weightbearing as tolerated  Continue CIR  Team conference today to discuss current and goals and coordination of care, home and environmental barriers, and discharge planning with nursing, case manager, and therapies.  2. Antithrombotics: -DVT/anticoagulation:Subcutaneous Lovenox. vascular study negative for DVT -antiplatelet therapy: N/A 3. Pain Management:  Scheduled tylenol, local ice/ heat  Have stopped Oxy and Robaxin due to lethargy 4. Mood:Wellbutrin 150 mg daily and 300 mg daily at bedtime  Celexa 40 mg daily, decreased to 20 mg on 3/11 due with prolonged QT  Provide emotional support  Depressed, appreciate neuropsych eval  Antipsychotic agents: no antipsychotics given PD 5. Neuropsych: This patientisnot fully capable of making decisions on hisown behalf. 6. Skin/Wound Care:Routine skin checks 7. Fluids/Electrolytes/Nutrition:Routine in and out's   BMP within acceptable range on 3/18  P.o. intake has been variable  Advanced to regular texture thin liquids with supervision, now intermittent 8. Acute blood loss anemia.   Hemoglobin 9.7 on 3/16  Continue to monitor 9. Parkinson disease. Sinemet 25-100 1-2 tablets 3 times a day, Comtan 200 mg 3 times a day,Requip 1 mg 3 times a day,  Seen by Neurology- trial Ativan without results, appreciate recs 10. Chronic idiopathic constipation. Linzess290 mcg daily  -colace stopped per wife request 11. Overactive bladder. Toviaz 4 mg daily 12.  Hyperglycemia  Monitor with increased mobility  Improving 13.  Leukocytosis  WBCs 12.2 on 3/16  UA relatively unremarkable,  urine culture NG  Ammonia WNL  Chest x-ray   without acute changes  Afebrile 14.  Hypoalbuminemia  Supplement initiated on 3/11  15.  Labile blood pressure  Labile, but overall controlled on 3/18  LOS: 8 days A FACE TO FACE EVALUATION WAS PERFORMED  [name] [name] Lorie Phenix 12/19/2018, 9:32 AM

## 2018-12-19 NOTE — Progress Notes (Signed)
Physical Therapy Session Note  Patient Details  Name: [name] [name] MRN: [id number] Date of Birth: 1950-11-17  Today's Date: 12/19/2018 PT Individual Time: 1310-1350 PT Individual Time Calculation (min): 40 min   Short Term Goals: Week 1:  PT Short Term Goal 1 (Week 1): Pt will perform bed mobility with max assist  PT Short Term Goal 2 (Week 1): Pt will perform bed<>transfers with mod assist  PT Short Term Goal 3 (Week 1): Pt will propell WC with min assist x 165ft  PT Short Term Goal 4 (Week 1): Pt will ambulate 43ft with mod assist   Skilled Therapeutic Interventions/Progress Updates:    Patient in w/c in room eating lunch which had just arrived.  Patient educated on reason why he doesn't see the same therapist.  Wrote on board primary team.  Discussed holding off rest of his lunch for speech therapy at 1:45.  Patient assisted in w/c to gym. Transferred to stand with min A x 3 reps for practice cues for hand placement.  Gait with RW initially slow and shuffling steps with flexed posture improving with cues and time, chair at end of room as goal, but pt requested to keep walking and performed yet another lap in gym approx 200' total.  Cues for avoiding obstacles and assist for safety/balance.  Noted bilateral ankle/foot supination during gait worse on L.  Patient seated on low mat with greatly increased time and cues.  Sit to stand from mat with cues for hand position and for increased anterior weight shift with min to mod A.  Transferred with RW to w/c min A.  Pushed in chair to room for handoff to SLP.   Therapy Documentation Precautions:  Precautions Precautions: Fall Precaution Comments: history of parkinson's Restrictions Weight Bearing Restrictions: No LLE Weight Bearing: Weight bearing as tolerated Pain: Pain Assessment Faces Pain Scale: Hurts a little bit Pain Type: Surgical pain Pain Location: Hip Pain Orientation: Left Pain Descriptors / Indicators: Grimacing Pain  Onset: With Activity(transitional movements) Pain Intervention(s): Repositioned;Ambulation/increased activity    Therapy/Group: Individual Therapy  Reginia Naas  [name] [name], PT 12/19/2018, 5:14 PM

## 2018-12-19 NOTE — Progress Notes (Signed)
Occupational Therapy Session Note  Patient Details  Name: [name] [name] MRN: [id number] Date of Birth: [birth year]  Today's Date: 12/19/2018 OT Individual Time: 3785-8850 OT Individual Time Calculation (min): 74 min    Short Term Goals: Week 1:  OT Short Term Goal 1 (Week 1): Pt will complete LB bathing with mod assist sit to stand. OT Short Term Goal 1 - Progress (Week 1): Met OT Short Term Goal 2 (Week 1): Pt will complete LB dressing with mod assist using AE PRN. OT Short Term Goal 2 - Progress (Week 1): Not met OT Short Term Goal 3 (Week 1): Pt will complete UB dressing with min assist 2 consecutive sessions. OT Short Term Goal 3 - Progress (Week 1): Not met OT Short Term Goal 4 (Week 1): Pt will perform stand pivot transfer to the 3:1 with mod assist using the RW for support.  OT Short Term Goal 4 - Progress (Week 1): Met  Skilled Therapeutic Interventions/Progress Updates:    1;1. Pt received w/c. Pt requesting to "get out of the chair." Pt completes sit to stand at sink with min A with VC for hand placement on w/c at sink. Pt completes oral care in standing. Pt dons shirt seated in w/c with supervision and pants with min A for threading LLE into pants and steady A to advance pants past hips. Changed cushion in TIS for improved comfort. Pt educated on AE for donning socks. However upon attempt to use sock aide, pt feet to wide to fit foot into sock aide. Exited sesion with pt seated in TIS, tilted back,k and belt alarm on.  Therapy Documentation Precautions:  Precautions Precautions: Fall Precaution Comments: history of parkinson's Restrictions Weight Bearing Restrictions: Yes LLE Weight Bearing: Weight bearing as tolerated General:   Vital Signs:   Pain:   ADL: ADL Eating: Supervision/safety Where Assessed-Eating: Wheelchair Grooming: Supervision/safety Where Assessed-Grooming: Wheelchair Upper Body Bathing: Maximal assistance Where Assessed-Upper Body Bathing:  Wheelchair Lower Body Bathing: Dependent Where Assessed-Lower Body Bathing: Wheelchair Upper Body Dressing: Maximal assistance Where Assessed-Upper Body Dressing: Wheelchair Lower Body Dressing: Dependent Where Assessed-Lower Body Dressing: Wheelchair Toileting: Dependent Where Assessed-Toileting: Bedside Commode Toilet Transfer: Dependent(use of stedy) Toilet Transfer Method: Other (comment)(Stedy) Science writer: Art gallery manager    Praxis   Exercises:   Other Treatments:     Therapy/Group: Individual Therapy  Tonny Branch 12/19/2018, 10:46 AM

## 2018-12-19 NOTE — Progress Notes (Signed)
Speech Language Pathology Daily Session Note  Patient Details  Name: [name] [name] MRN: [id number] Date of Birth: 1951/08/07  Today's Date: 12/19/2018  Session 1: SLP Individual Time: 1150-1200 SLP Individual Time Calculation (min): 10 min   Session 2: SLP Individual Time: 1751-0258 SLP Individual Time Calculation (min): 40 min  Short Term Goals: Week 1: SLP Short Term Goal 1 (Week 1): Pt will utilize external aids to orient to place, date, and situation with max assist multimodal cues.   SLP Short Term Goal 2 (Week 1): Pt will sustain his attention to basic, familiar tasks for 2 minute intervals with max cues for redirection.   SLP Short Term Goal 3 (Week 1): Pt will recall basic, daily information with max assist for use of memory compensatory aids.   SLP Short Term Goal 4 (Week 1): Patient will consume current diet with minimal overt s/s of aspiration with Mod A verbal cues for use of swallowing compensatory strategies.   Skilled Therapeutic Interventions:  Session 1: Skilled treatment session focused on cognitive goals. SLP facilitated session by providing supervision verbal cues for recall of events from previous therapy sessions and for problem solving while utilizing his cell phone and ipod to locate specific information. Patient left upright in wheelchair with all needs within reach. Continue with current plan of care.     Session 2: Skilled treatment session focused on cognitive and dysphagia goals. SLP facilitated session by providing skilled observation with lunch meal of regular textures with thin liquids. Patient consumed meal without overt s/s of aspiration, therefore, recommend patient continue current diet. Patient able to alternate attention between self-feeding and functional conversation with Mod I and demonstrated appropriate problem solving for self-feeding and tray set-up. Patient left upright in wheelchair with alarm on and all needs within reach. Continue with  current plan of care.   Pain No/Denies Pain   Therapy/Group: Individual Therapy  Trenese Haft 12/19/2018, 3:50 PM

## 2018-12-20 ENCOUNTER — Inpatient Hospital Stay (HOSPITAL_COMMUNITY): Payer: Self-pay | Admitting: Speech Pathology

## 2018-12-20 ENCOUNTER — Inpatient Hospital Stay (HOSPITAL_COMMUNITY): Payer: Self-pay

## 2018-12-20 ENCOUNTER — Inpatient Hospital Stay (HOSPITAL_COMMUNITY): Payer: Self-pay | Admitting: Occupational Therapy

## 2018-12-20 ENCOUNTER — Inpatient Hospital Stay (HOSPITAL_COMMUNITY): Payer: Self-pay | Admitting: Physical Therapy

## 2018-12-20 NOTE — Progress Notes (Signed)
Physical Therapy Weekly Progress Note  Patient Details  Name: [name] [name] MRN: [id number] Date of Birth: 22-Jun-1951  Beginning of progress report period: December 12, 2018 End of progress report period: December 20, 2018  Today's Date: 12/20/2018 PT Individual Time: 1100-1200 PT Individual Time Calculation (min): 60 min   Patient has met 3 of 4 short term goals. Pt has made slow and steady progress towards LTGs this week 2/2 intermittent bouts of lethargy and hallucinations impacting pt's ability to meaningfully participate in therapy. This appears to have since resolved and pt has been consistently participating recent days. He is performing sit<>stands, transfers, and gait w/ min assist using RW, however continues to require mod-max assist for bed mobility and sit<>stands from lower and compliant surfaces 2/2 decreased initiation, poor carryover and pain.  Patient continues to demonstrate the following deficits muscle weakness and muscle joint tightness, decreased cardiorespiratoy endurance, impaired timing and sequencing, unbalanced muscle activation, decreased coordination and decreased motor planning, decreased initiation, decreased awareness, decreased problem solving, decreased safety awareness and decreased memory and decreased sitting balance, decreased standing balance, decreased postural control and decreased balance strategies and therefore will continue to benefit from skilled PT intervention to increase functional independence with mobility.  Patient progressing toward long term goals..  Continue plan of care. LTGs upgraded to supervision-min assist overall as he has exceeded some STGs. Anticipate pt will be limited household ambulator at discharge, added household ambulation goal.   PT Short Term Goals Week 1:  PT Short Term Goal 1 (Week 1): Pt will perform bed mobility with max assist  PT Short Term Goal 1 - Progress (Week 1): Progressing toward goal PT Short Term Goal 2 (Week  1): Pt will perform bed<>transfers with mod assist  PT Short Term Goal 2 - Progress (Week 1): Met PT Short Term Goal 3 (Week 1): Pt will propell WC with min assist x 143f  PT Short Term Goal 3 - Progress (Week 1): Met PT Short Term Goal 4 (Week 1): Pt will ambulate 186fwith mod assist  PT Short Term Goal 4 - Progress (Week 1): Met Week 2:  PT Short Term Goal 1 (Week 2): Pt will initiate stair training PT Short Term Goal 2 (Week 2): Pt will maintain dynamic standing balance w/ min assist PT Short Term Goal 3 (Week 2): Pt will ambulate 150' w/ CGA w/ LRAD  Skilled Therapeutic Interventions/Progress Updates:   Pt in w/c and agreeable to therapy, pain 3/10 in L hip. Stand pivot transfer, w/ min assist, to manual w/c for increased independence w/ locomotion. Instructed pt on self-propelling w/c using BUEs w/ min assist x150'. Worked on gaPersonnel officer/ RW. Ambulated 100' and 150' w/ min assist and verbal cues for increased foot clearance and step length bilaterally to increase to more functional gait speed, tactile cues for upright posture. Max assist sit<>stand from manual w/c which was slightly lower than TIS, faded to min assist w/ repetition. Continues to need many tactile and verbal cues for sit<>stand technique w/ forward trunk lean and where to place UEs. Ambulated back to room w/ same assist as detailed above, 150'. Pt requesting to toilet. Ended session sitting on toilet and in care of wife, all needs met. NT made aware of pt's status, wife agreeable to call nurse when ready to get off toilet.   Therapy Documentation Precautions:  Precautions Precautions: Fall Precaution Comments: history of parkinson's Restrictions Weight Bearing Restrictions: Yes LLE Weight Bearing: Weight bearing as tolerated Vital Signs:  Therapy/Group: Individual Therapy  Brantleigh Mifflin Clent Demark 12/20/2018, 12:14 PM

## 2018-12-20 NOTE — Progress Notes (Signed)
[name] [name] PROGRESS NOTE  Subjective/Complaints: Pt states "i'm doing better". Pain is control. Slept well.   ROS: Limited due to cognitive/behavioral   Objective: Vital Signs: Blood pressure 138/79, pulse 75, temperature 98.6 F (37 C), temperature source Oral, resp. rate 18, weight (!) 140 kg, SpO2 96 %. No results found. No results for input(s): WBC, HGB, HCT, PLT in the last 72 hours. Recent Labs    12/19/18 0454  NA 135  K 3.8  CL 105  CO2 26  GLUCOSE 107*  BUN 16  CREATININE 1.09  CALCIUM 8.5*    Physical Exam: BP 138/79 (BP Location: Left Arm)   Pulse 75   Temp 98.6 F (37 C) (Oral)   Resp 18   Wt (!) 140 kg   SpO2 96%   BMI 39.63 kg/m  Constitutional: No distress . Vital signs reviewed. HEENT: EOMI, oral membranes moist Neck: supple Cardiovascular: RRR without murmur. No JVD    Respiratory: CTA Bilaterally without wheezes or rales. Normal effort    GI: BS +, non-tender, non-distended  Musculoskeletal: Edema and tenderness in left hip, improving Neurological:Alert and oriented Mask facies.  Dysarthria.  Motor:  LLE: remains Limited by pain, hip flexion, knee extension 2-/5, ankle dorsiflexion 4-/5, stable RLE: 4+/5 proximal to distal, stable Skin: left hip cdi Psychiatric: flat  Assessment/Plan: 1. Functional deficits secondary to left hip fracture with history of Parkinson's disease which require 3+ hours per day of interdisciplinary therapy in a comprehensive inpatient rehab setting.  Physiatrist is providing close team supervision and 24 hour management of active medical problems listed below.  Physiatrist and rehab team continue to assess barriers to discharge/monitor patient progress toward functional and medical goals  Care Tool:  Bathing    Body parts bathed by patient: Right arm, Left arm, Chest, Abdomen, Face, Front perineal area, Right upper leg, Left upper leg   Body parts bathed by helper: Right  lower leg, Left lower leg, Buttocks     Bathing assist Assist Level: Moderate Assistance - Patient 50 - 74%     Upper Body Dressing/Undressing Upper body dressing   What is the patient wearing?: Pull over shirt    Upper body assist Assist Level: Minimal Assistance - Patient > 75%    Lower Body Dressing/Undressing Lower body dressing      What is the patient wearing?: Pants     Lower body assist Assist for lower body dressing: Maximal Assistance - Patient 25 - 49%     Toileting Toileting Toileting Activity did not occur (Clothing management and hygiene only): Safety/medical concerns  Toileting assist Assist for toileting: 2 Helpers     Transfers Chair/bed transfer  Transfers assist  Chair/bed transfer activity did not occur: Safety/medical concerns  Chair/bed transfer assist level: Minimal Assistance - Patient > 75%     Locomotion Ambulation   Ambulation assist   Ambulation activity did not occur: Safety/medical concerns  Assist level: Minimal Assistance - Patient > 75% Assistive device: Walker-rolling Max distance: 200'   Walk 10 feet activity   Assist  Walk 10 feet activity did not occur: Safety/medical concerns  Assist level: Minimal Assistance - Patient > 75% Assistive device: Walker-rolling   Walk 50 feet activity   Assist Walk 50 feet with 2 turns activity did not occur: Safety/medical concerns  Assist level: Minimal Assistance - Patient > 75% Assistive device: Walker-rolling    Walk 150 feet activity   Assist Walk 150 feet activity did not occur: Safety/medical concerns  Assist level: Minimal Assistance - Patient > 75% Assistive device: Walker-rolling    Walk 10 feet on uneven surface  activity   Assist Walk 10 feet on uneven surfaces activity did not occur: Safety/medical concerns         Wheelchair     Assist   Type of Wheelchair: Manual    Wheelchair assist level: Dependent - Patient 0% Max wheelchair distance:  100    Wheelchair 50 feet with 2 turns activity    Assist        Assist Level: Dependent - Patient 0%   Wheelchair 150 feet activity     Assist     Assist Level: Dependent - Patient 0%      Medical Problem List and Plan: 1.Decreased functional mobilitysecondary to Left femoral neck fracture. 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 Weightbearing as tolerated  Continue CIR, PT, OT.  2. Antithrombotics: -DVT/anticoagulation:Subcutaneous Lovenox. vascular study negative for DVT -antiplatelet therapy: N/A 3. Pain Management:pain under reasonable control  Scheduled tylenol, local ice/ heat  Have stopped Oxy and Robaxin due to lethargy 4. Mood:Wellbutrin 150 mg daily and 300 mg daily at bedtime  Celexa 40 mg daily, decreased to 20 mg on 3/11 due with prolonged QT  Provide emotional support  Depressed, appreciate neuropsych eval  Antipsychotic agents: no antipsychotics given PD 5. Neuropsych: This patientisnot fully capable of making decisions on hisown behalf. 6. Skin/Wound Care:Routine skin checks 7. Fluids/Electrolytes/Nutrition:Routine in and out's   BMP within acceptable range on 3/18  P.o. intake appears to be improving 75-80% yesterday  Advanced to regular texture thin liquids with supervision, now intermittent 8. Acute blood loss anemia.   Hemoglobin 9.7 on 3/16  Continue to monitor 9. Parkinson disease. Sinemet 25-100 1-2 tablets 3 times a day, Comtan 200 mg 3 times a day,Requip 1 mg 3 times a day,  Seen by Neurology- trial Ativan without results, appreciate recs 10. Chronic idiopathic constipation. Linzess290 mcg daily  -colace stopped per wife request 11. Overactive bladder. Toviaz 4 mg daily 12.  Hyperglycemia  Improved. No longer checking CBG's 13.  Leukocytosis  WBCs 12.2 on 3/16  UA relatively unremarkable, urine culture NG  Ammonia WNL  Chest x-ray   without acute changes  Afebrile 14.   Hypoalbuminemia  Supplement initiated on 3/11  15.  Labile blood pressure   controlled on 3/19  LOS: 9 days A FACE TO [place] [name] [name] 12/20/2018, 11:41 AM

## 2018-12-20 NOTE — Plan of Care (Signed)
  Problem: RH BOWEL ELIMINATION Goal: RH STG MANAGE BOWEL WITH ASSISTANCE Description STG Manage Bowel with min Assistance.  Outcome: Progressing   Problem: RH BLADDER ELIMINATION Goal: RH STG MANAGE BLADDER WITH ASSISTANCE Description STG Manage Bladder With min Assistance  Outcome: Progressing   Problem: RH SKIN INTEGRITY Goal: RH STG SKIN FREE OF INFECTION/BREAKDOWN Outcome: Progressing Goal: RH STG MAINTAIN SKIN INTEGRITY WITH ASSISTANCE Description STG Maintain Skin Integrity With min Assistance.  Outcome: Progressing   Problem: RH SAFETY Goal: RH STG ADHERE TO SAFETY PRECAUTIONS W/ASSISTANCE/DEVICE Description STG Adhere to Safety Precautions With min Assistance/Device.  Outcome: Progressing   Problem: RH PAIN MANAGEMENT Goal: RH STG PAIN MANAGED AT OR BELOW PT'S PAIN GOAL Description Less than 3 out of 10  Outcome: Progressing   Problem: RH KNOWLEDGE DEFICIT GENERAL Goal: RH STG INCREASE KNOWLEDGE OF SELF CARE AFTER HOSPITALIZATION Outcome: Progressing   Problem: Consults Goal: RH GENERAL PATIENT EDUCATION Description See Patient Education module for education specifics. Outcome: Progressing

## 2018-12-20 NOTE — Progress Notes (Signed)
Speech Language Pathology Discharge Summary  Patient Details  Name: [name] [name] MRN: [id number] Date of Birth: 11/18/1950  Today's Date: 12/20/2018 SLP Individual Time: 1255-1340 SLP Individual Time Calculation (min): 45 min   Skilled Therapeutic Interventions:  Skilled treatment session focused on dysphagia and cognitive goals. Upon arrival, patient was sitting upright in the wheelchair consuming his lunch meal of regular textures with thin liquids. Patient consumed meal without overt s/s of aspiration and required supervision verbal cues to alternate attention between self-feeding and functional conversation for ~25 minutes. SLP also facilitated session by providing supervision verbal cues for recall of events from previous therapy sessions. Patient's wife present and bot the patient and his wife report patient is at his cognitive baseline, therefore, he will be discharged from skilled SLP intervention. SLP provided education to the patient's wife in regards to strategies to utilize at home to maximize recall and handouts also given to reinforce information. She verbalized understanding and reported they are utilizing these strategies already at home. She also reported she understands patient has baseline cognitive impairments from dementia and feels they may be somewhat exacerbated by fatigue due to patient requiring multiple naps during the day at home which he is not getting here due to sitting up in wheelchair the majority of the day. Patient left upright in wheelchair with alarm on and wife present.   Patient has met 4 of 4 long term goals.  Patient to discharge at overall Supervision;Min level.   Reasons goals not met: N/A   Clinical Impression/Discharge Summary: Patient has made functional gains and has met 4 of 4 LTGs this admission. Currently, patient is consuming regular textures with thin liquids without overt s/s of aspiration and overall Mod I for use of swallowing compensatory  strategies. Patient currently requires overall supervision-Min A verbal cues to complete basic and familiar tasks safely in regards to sustained attention, functional problem solving, recall and awareness. Both the patient and his wife report he is at his cognitive baseline, therefore, patient will be discharged from skilled SLP intervention and f/u is not warranted at this time. Patient will eventually discharge home with 24 hour supervision from family.   Care Partner:  Caregiver Able to Provide Assistance: Yes  Type of Caregiver Assistance: Physical;Cognitive  Recommendation:  24 hour supervision/assistance      Equipment: N/A   Reasons for discharge: Treatment goals met   Patient/Family Agrees with Progress Made and Goals Achieved: Yes    Blanchard Willhite 12/20/2018, 3:08 PM

## 2018-12-20 NOTE — Progress Notes (Signed)
Physical Therapy Session Note  Patient Details  Name: [name] [name] MRN: [id number] Date of Birth: 06-03-1951  Today's Date: 12/20/2018 PT Individual Time: 1005-1035 PT Individual Time Calculation (min): 30 min   Short Term Goals: Week 1:  PT Short Term Goal 1 (Week 2): Pt will initiate stair training PT Short Term Goal 2 (Week 2): Pt will maintain dynamic standing balance w/ min assist PT Short Term Goal 3 (Week 2): Pt will ambulate 150' w/ CGA w/ LRAD  Skilled Therapeutic Interventions/Progress Updates:    Patient in w/c in room with wife present.  Discussed progress with PT yesterday.  She requested to hear outcome of conference done yesterday.  Educated SW would update.  Patient hoping to work legs for this session and agreed upon using Nu Step for strength and mobility.  Assisted in w/c to dayroom and min A for stand pivot with RW to Nu Step with mod cues and increased time.  Performed with UE/LE x 7 minutes after placing on Nu Step at level 3.  Patient assisted back to w/c with increased assist for sit to stand from seat on Nu Step and 3 attempts for success with less support using both hands on walker and cues for increased anterior weight shift and min to mod A.  Patient in w/c assisted to room and left with wife present and alarm belt on, call bell in reach.   Therapy Documentation Precautions:  Precautions Precautions: Fall Precaution Comments: history of parkinson's Restrictions Weight Bearing Restrictions: Yes LLE Weight Bearing: Weight bearing as tolerated Pain: Pain Assessment Faces Pain Scale: Hurts little more Pain Type: Surgical pain Pain Location: Hip Pain Orientation: Left Pain Descriptors / Indicators: Operative site guarding;Discomfort Pain Onset: With Activity Pain Intervention(s): Repositioned    Therapy/Group: Individual Therapy  Reginia Naas  Magda Kiel, PT 12/20/2018, 1:59 PM

## 2018-12-20 NOTE — Progress Notes (Signed)
Occupational Therapy Session Note  Patient Details  Name: [name] [name] MRN: [id number] Date of Birth: 02/23/1951  Today's Date: 12/20/2018 OT Individual Time: 4373-5789 OT Individual Time Calculation (min): 57 min   Short Term Goals: Week 2:  OT Short Term Goal 1 (Week 2): Pt will complete toilet transfers with min assist RW to 3:1 OT Short Term Goal 2 (Week 2): Pt will complete LB dressing with mod assist sit to stand and AE PRN.  OT Short Term Goal 3 (Week 2): Pt will complete LB bathing with min assist sit to stand.   OT Short Term Goal 4 (Week 2): Pt will manage clothing with min assist sit to stand.    Skilled Therapeutic Interventions/Progress Updates:    Pt greeted seated in wc with nurse tech about to set pt up for breakfast. Pt slow to initiate self-feeding task and needed verbal cues to begin eating. Pt able to manipulate utensils and bring food to mouth. Pt needed min verbal cues throughout to continue eating. Pt declined any bathing/dressing stating he had already been washed off today. Pt reported feeling depressed today, so utilized therapeutic use of self and music therapy to help brighten patients mood. Pt brought to day room and completed 15 mins on [employer] arm bike on level 3 without rest breaks. Pt returned to room and left seated in wc with alarm belt on and needs met.   Therapy Documentation Precautions:  Precautions Precautions: Fall Precaution Comments: history of parkinson's Restrictions Weight Bearing Restrictions: Yes LLE Weight Bearing: Weight bearing as tolerated Pain:  none/denies pain  Therapy/Group: Individual Therapy  [name] 12/20/2018, 9:14 AM

## 2018-12-21 ENCOUNTER — Inpatient Hospital Stay (HOSPITAL_COMMUNITY): Payer: Self-pay | Admitting: Occupational Therapy

## 2018-12-21 ENCOUNTER — Inpatient Hospital Stay (HOSPITAL_COMMUNITY): Payer: Self-pay | Admitting: Speech Pathology

## 2018-12-21 ENCOUNTER — Inpatient Hospital Stay (HOSPITAL_COMMUNITY): Payer: Self-pay | Admitting: Physical Therapy

## 2018-12-21 NOTE — Progress Notes (Signed)
Occupational Therapy Session Note  Patient Details  Name: CHEYENNE SCHUMM MRN: [id number] Date of Birth: 04/20/1951  Today's Date: 12/21/2018 OT Individual Time: 0830-0930 OT Individual Time Calculation (min): 60 min   Short Term Goals: Week 2:  OT Short Term Goal 1 (Week 2): Pt will complete toilet transfers with min assist RW to 3:1 OT Short Term Goal 2 (Week 2): Pt will complete LB dressing with mod assist sit to stand and AE PRN.  OT Short Term Goal 3 (Week 2): Pt will complete LB bathing with min assist sit to stand.   OT Short Term Goal 4 (Week 2): Pt will manage clothing with min assist sit to stand.    Skilled Therapeutic Interventions/Progress Updates:     Pt greeted semi-reclined in bed and agreeable to OT treatment session. Pt needed mod A and increased time to get to EOB. Attempted sit<>stand 3x from raised bed and RW, but pt unable to power up. Stedy used for sit<>stand with CGA and heavy use of bars to pull up. Pt transferred onto tub bench using stedy. Bathing completed with pt able to achieve lateral lean to wash buttocks today. OT educated on use of LH sponge to increase access to LB, however, sponges unavailable at this time so OT washed feet. Stedy transfer out of shower in similar fashion. Pt needs increased time for all BADL tasks. Worked on LB dressing strategies, but pt needed OT assistance for time management. Educated on use of friction reducing bag to don TED hose as well. OT played pt preferred music throughout session with pt reporting improved mood. Pt left seated in regular wc with alarm belt on, call bell in reach, and needs met.   Therapy Documentation Precautions:  Precautions Precautions: Fall Precaution Comments: history of parkinson's Restrictions Weight Bearing Restrictions: Yes LLE Weight Bearing: Weight bearing as tolerated Pain:   denies pain  Therapy/Group: Individual Therapy  [name] 12/21/2018, 9:47 AM

## 2018-12-21 NOTE — Patient Care Conference (Signed)
Inpatient RehabilitationTeam Conference and Plan of Care Update Date: 12/19/2018   Time: 2:35 PM    Patient Name: [name] [name]      Medical Record Number: [id number]  Date of Birth: [birth year] Sex: Male         Room/Bed: 4M07C/4M07C-01 Payor Info: Payor: MEDICARE / Plan: MEDICARE PART A AND B / Product Type: *No Product type* /    Admitting Diagnosis: l hip fx sp total arthroplasty  Admit Date/Time:  12/11/2018  3:09 PM Admission Comments: No comment available   Primary Diagnosis:  <principal problem not specified> Principal Problem: <principal problem not specified>  Patient Active Problem List   Diagnosis Date Noted  . AMS (altered mental status)   . Labile blood pressure   . Dysphagia   . Acute blood loss anemia   . Leukocytosis   . Episode of recurrent major depressive disorder ([place])   . Hypoalbuminemia due to protein-calorie malnutrition ([place])   . Left displaced femoral neck fracture ([place]) 12/11/2018  . Closed fracture of neck of left femur ([place])   . Closed left hip fracture ([place]) 12/08/2018  . Left foot pain 07/26/2018  . TMJ (temporomandibular joint syndrome) 10/10/2017  . Parkinson's disease ([place]) 03/20/2017  . Bilateral lower extremity edema 03/20/2017  . Chronic venous insufficiency 09/13/2016  . Fatigue 03/15/2016  . Parkinsonian features 09/15/2015  . ED (erectile dysfunction) 09/15/2015  . MDD (major depressive disorder), recurrent episode, severe ([place]) 02/10/2014  . Nonspecific abnormal electrocardiogram (ECG) (EKG) 02/07/2014  . Non-compliant behavior 02/03/2014  . Morton's neuroma of left foot 08/07/2013  . Attention deficit disorder without mention of hyperactivity 03/13/2012  . Hyperglycemia 01/26/2012  . Hypogonadism male 01/24/2012  . CHEST TIGHTNESS 02/18/2008    Expected Discharge Date: Expected Discharge Date: 01/04/19  Team Members Present: Physician leading conference: Dr. Delice Lesch Social Worker Present: Lennart Pall, LCSW Nurse  Present: Leonette Nutting, RN PT Present: Barrie Folk, PT OT Present: Mariane Masters, OT SLP Present: Weston Anna, SLP PPS Coordinator present : Gunnar Fusi     Current Status/Progress Goal Weekly Team Focus  Medical   Decreased functional mobility secondary to  Left femoral neck fracture. S/P left total hip replacement uncemented anterior approach 12/09/2018  Improve mobility, transfers, depression, WBCs  See above   Bowel/Bladder   Pt is incontinent at times of both B/B. LBM 12/17/2018.  Encourage timed toileting. Maintain regular bowel pattern.  Assist with toileting needs PRN.   Swallow/Nutrition/ Hydration   Regular textures with thin liquids, Mod I  Mod I   Goal Met    ADL's   Mod A UB bathing, Max A LB, Stedy transfer min/mod A  Supervision-Min A  Self-care retraining, sit<>stand, modified bathing/dressing, cognition, activity tolerance   Mobility   stedy transfer, min assist sit<>stands to RW, mod assist gait 15' w/ w/c follow  Min assist bed mobility, trasnfers, and ambulation at household level  OOB tolerance, balance and postural control, all functional mobility, cognitive remediation   Communication             Safety/Cognition/ Behavioral Observations  Supervision-Min A   Supervision  Family Education    Pain   No complaints of pain.  Remain pain free.  Assess pain Q shift and PRN.   Skin   Pt has incision to L hip that is sutured.  Treat skin and promote healing per orders.  Assess skin Q shift and PRN.    Rehab Goals Patient on target to meet rehab goals: Yes *See  Care Plan and progress notes for long and short-term goals.     Barriers to Discharge  Current Status/Progress Possible Resolutions Date Resolved   Physician    Medical stability;Other (comments)  Baseline Parkinsons  See above  Therapies, follow labs, Neuro meds      Nursing                  PT                    OT                  SLP                SW                 Discharge Planning/Teaching Needs:  Plan for pt to return home with wife who can provide 24/7 assistance.  Teaching has been ongoing.   Team Discussion:  BP more controlled;  Depression seems to have lessened somewhat?  Diet back to regular and good gains with cognition - wife reports at baseline.  Improved orientation and awareness, however, memory still very impaired.  Mod-max with b/d but at min today.  Decreased activity tolerance.  Feel need to extended LOS and have set new target date of 4/3.  Revisions to Treatment Plan:  NA    Continued Need for Acute Rehabilitation Level of Care: The patient requires daily medical management by a physician with specialized training in physical medicine and rehabilitation for the following conditions: Daily direction of a multidisciplinary physical rehabilitation program to ensure safe treatment while eliciting the highest outcome that is of practical value to the patient.: Yes Daily medical management of patient stability for increased activity during participation in an intensive rehabilitation regime.: Yes Daily analysis of laboratory values and/or radiology reports with any subsequent need for medication adjustment of medical intervention for : Post surgical problems;Neurological problems;Other   I attest that I was present, lead the team conference, and concur with the assessment and plan of the team.   Rosita Guzzetta 12/21/2018, 11:40 AM

## 2018-12-21 NOTE — Progress Notes (Signed)
[facility] PHYSICAL MEDICINE & REHABILITATION PROGRESS NOTE  Subjective/Complaints: Pt seen laying in bed this AM.  He states he slept well overnight.   Denies complaints.   ROS: Denies CP, SOB, N/V/D   Objective: Vital Signs: Blood pressure 135/88, pulse 80, temperature 97.9 F (36.6 C), temperature source Oral, resp. rate 19, weight (!) 140 kg, SpO2 98 %. No results found. No results for input(s): WBC, HGB, HCT, PLT in the last 72 hours. Recent Labs    12/19/18 0454  NA 135  K 3.8  CL 105  CO2 26  GLUCOSE 107*  BUN 16  CREATININE 1.09  CALCIUM 8.5*    Physical Exam: BP 135/88 (BP Location: Right Arm)   Pulse 80   Temp 97.9 F (36.6 C) (Oral)   Resp 19   Wt (!) 140 kg   SpO2 98%   BMI 39.63 kg/m  Constitutional: No distress . Vital signs reviewed. HENT: Normocephalic.  Atraumatic. Eyes: EOMI. No discharge. Cardiovascular: RRR. No JVD. Respiratory: CTA Bilaterally. Normal effort. GI: BS +. Non-distended. Musc: No edema or tenderness in extremities. Musculoskeletal: Edema and tenderness in left hip, stable Neurological:Alert and oriented Mask facies.  Dysarthria.  Motor:  LLE: remains Limited by pain, hip flexion, knee extension 2+-3-/5, ankle dorsiflexion 4/5 RLE: 4+/5 proximal to distal, stable Skin: left hip with suture c/d/i Psychiatric: flat  Assessment/Plan: 1. Functional deficits secondary to left hip fracture with history of Parkinson's disease which require 3+ hours per day of interdisciplinary therapy in a comprehensive inpatient rehab setting.  Physiatrist is providing close team supervision and 24 hour management of active medical problems listed below.  Physiatrist and rehab team continue to assess barriers to discharge/monitor patient progress toward functional and medical goals  Care Tool:  Bathing    Body parts bathed by patient: Right arm, Left arm, Chest, Abdomen, Face, Front perineal area, Right upper leg, Left upper leg   Body  parts bathed by helper: Right lower leg, Left lower leg, Buttocks     Bathing assist Assist Level: Moderate Assistance - Patient 50 - 74%     Upper Body Dressing/Undressing Upper body dressing   What is the patient wearing?: Pull over shirt    Upper body assist Assist Level: Minimal Assistance - Patient > 75%    Lower Body Dressing/Undressing Lower body dressing      What is the patient wearing?: Pants     Lower body assist Assist for lower body dressing: Maximal Assistance - Patient 25 - 49%     Toileting Toileting Toileting Activity did not occur (Clothing management and hygiene only): Safety/medical concerns  Toileting assist Assist for toileting: 2 Helpers     Transfers Chair/bed transfer  Transfers assist  Chair/bed transfer activity did not occur: Safety/medical concerns  Chair/bed transfer assist level: Minimal Assistance - Patient > 75%     Locomotion Ambulation   Ambulation assist   Ambulation activity did not occur: Safety/medical concerns  Assist level: Minimal Assistance - Patient > 75% Assistive device: Walker-rolling Max distance: 150'   Walk 10 feet activity   Assist  Walk 10 feet activity did not occur: Safety/medical concerns  Assist level: Minimal Assistance - Patient > 75% Assistive device: Walker-rolling   Walk 50 feet activity   Assist Walk 50 feet with 2 turns activity did not occur: Safety/medical concerns  Assist level: Minimal Assistance - Patient > 75% Assistive device: Walker-rolling    Walk 150 feet activity   Assist Walk 150 feet activity did not  occur: Safety/medical concerns  Assist level: Minimal Assistance - Patient > 75% Assistive device: Walker-rolling    Walk 10 feet on uneven surface  activity   Assist Walk 10 feet on uneven surfaces activity did not occur: Safety/medical concerns         Wheelchair     Assist   Type of Wheelchair: Manual    Wheelchair assist level: Minimal Assistance  - Patient > 75% Max wheelchair distance: 100    Wheelchair 50 feet with 2 turns activity    Assist        Assist Level: Minimal Assistance - Patient > 75%   Wheelchair 150 feet activity     Assist     Assist Level: Dependent - Patient 0%      Medical Problem List and Plan: 1.Decreased functional mobilitysecondary to Left femoral neck fracture. 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 Weightbearing as tolerated  Continue CIR  D/c sutures 2. Antithrombotics: -DVT/anticoagulation:Subcutaneous Lovenox. vascular study negative for DVT -antiplatelet therapy: N/A 3. Pain Management:pain under reasonable control  Scheduled tylenol, local ice/ heat  Have stopped Oxy and Robaxin due to lethargy 4. Mood:Wellbutrin 150 mg daily and 300 mg daily at bedtime  Celexa 40 mg daily, decreased to 20 mg on 3/11 due with prolonged QT  Provide emotional support  Depressed, appreciate neuropsych eval  Antipsychotic agents: no antipsychotics given PD 5. Neuropsych: This patientisnot fully capable of making decisions on hisown behalf. 6. Skin/Wound Care:Routine skin checks 7. Fluids/Electrolytes/Nutrition:Routine in and out's   BMP within acceptable range on 3/18  P.o. intake appears to be improving 75-80% yesterday  Advanced to regular texture thin liquids with supervision, now intermittent 8. Acute blood loss anemia.   Hemoglobin 9.7 on 3/16  Labs ordered for Monday  Continue to monitor 9. Parkinson disease. Sinemet 25-100 1-2 tablets 3 times a day, Comtan 200 mg 3 times a day,Requip 1 mg 3 times a day,  Seen by Neurology- trial Ativan without results, appreciate recs 10. Chronic idiopathic constipation. Linzess290 mcg daily  -colace stopped per wife request 11. Overactive bladder. Toviaz 4 mg daily 12.  Hyperglycemia  Improved. No longer checking CBG's 13.  Leukocytosis  WBCs 12.2 on 3/16  Labs ordered for Monday  UA  relatively unremarkable, urine culture NG  Ammonia WNL  Chest x-ray   without acute changes  Afebrile 14.  Hypoalbuminemia  Supplement initiated on 3/11  15.  Labile blood pressure   Controlled on 3/20  LOS: 10 days A FACE TO FACE EVALUATION WAS PERFORMED  Vijay Durflinger Lorie Phenix 12/21/2018, 10:03 AM

## 2018-12-21 NOTE — Progress Notes (Signed)
Social Work Patient ID: [name] [name], male   DOB: [birth year], 68 y.o.   MRN: [id number]  Have reviewed team conference with pt and wife who are both aware and agreeable with newly targeted d/c date of 4/3 and min assist goals overall.  Pt very eager to d/c earlier if possible.  Pleased with progress the past several days and beginning to work on stairs.  Wife still trying to secure either a ramp or stair lift for home.  Continue to follow.  Samuele Storey, LCSW

## 2018-12-21 NOTE — Progress Notes (Signed)
Occupational Therapy Session Note  Patient Details  Name: [name] [name] MRN: [id number] Date of Birth: March 24, 1951  Today's Date: 12/21/2018 OT Individual Time: 1610-1705 OT Individual Time Calculation (min): 55 min    Skilled Therapeutic Interventions/Progress Updates: patient with wet brief upon approach for OT and preferred a brief change before transfer to chair.   He was able to wash front periarea with encouragement and moderate assistance (supine with head of bed elevated).   Other wise, he participated as follows:  Supine to edge of bed transfer:  Extra time to process and motor plan  And moderate assistance; sit to stand edge of bed x 3 without enough power to stand as walker; so she transferred sit to stnd into stedy with contact guard assist;    He required many cues to complete the steps of sitting down into chair while at the same time pushing hips back into chair.   Once in chair he complete upper extremity endurance to increase strength for self care and transfers.   Wife participated in session.   He was left seated in w/c with chair alarm in place and attentive wife nearby.     Therapy Documentation Precautions:  Precautions Precautions: Fall Precaution Comments: history of parkinson's Restrictions Weight Bearing Restrictions: Yes LLE Weight Bearing: Weight bearing as tolerated    Pain:  Therapy/Group: Individual Therapy  Herschell Dimes 12/21/2018, 7:12 PM

## 2018-12-21 NOTE — Progress Notes (Signed)
17 Sutures removed from right hip incision. Pt tolerated well. Incision is approximated. No drainage noted.   [name] [name] W [name] [name]

## 2018-12-21 NOTE — Progress Notes (Signed)
Physical Therapy Session Note  Patient Details  Name: [name] [name] MRN: [id number] Date of Birth: August 01, 1951  Today's Date: 12/21/2018 PT Individual Time: 1100-1223 PT Individual Time Calculation (min): 83 min   Short Term Goals: Week 2:  PT Short Term Goal 1 (Week 2): Pt will initiate stair training PT Short Term Goal 2 (Week 2): Pt will maintain dynamic standing balance w/ min assist PT Short Term Goal 3 (Week 2): Pt will ambulate 150' w/ CGA w/ LRAD  Skilled Therapeutic Interventions/Progress Updates:   Pt in w/c and agreeable to therapy, denies pain at rest but c/o L hip pain w/ mobility - does not rate and resolves w/ brief rest. Pt self-propelled w/c >250' w/ BUEs to work on endurance and UE strengthening. NuStep 8 min @ level 4 for global strengthening, endurance, and gentle ROM of BLEs in reciprocal movement pattern. Ambulated to therapy gym, ~200', w/ RW and CGA. Worked on sit<>stands from various heights of the mat to work on functional LE strengthening and blocked practice for improved carryover of task. Tactile cues for anterior trunk lean during transitions and verbal cues for technique and head/hips relationship. Focused on pushing up from beside him on mat to decrease reliance on RW for support. CGA from elevated mat surface, min assist from progressively lower mat surfaces w/ increased time. Performed 20-25 sit<>stands in total w/ intermittent brief rest breaks. Pt reporting needing to void partial way through session, voided in urinal w/ therapy gym w/ privacy curtain. Ambulated back to room w/ CGA, ~150'. Ended session in w/c, all needs in reach.   Therapy Documentation Precautions:  Precautions Precautions: Fall Precaution Comments: history of parkinson's Restrictions Weight Bearing Restrictions: Yes LLE Weight Bearing: Weight bearing as tolerated Pain: Pain Assessment Pain Scale: 0-10 Pain Score: 0-No pain  Therapy/Group: Individual Therapy  Kathrynne Kulinski Clent Demark 12/21/2018, 12:49 PM

## 2018-12-21 NOTE — Plan of Care (Signed)
  Problem: RH BOWEL ELIMINATION Goal: RH STG MANAGE BOWEL WITH ASSISTANCE Description STG Manage Bowel with min Assistance.  Outcome: Progressing   Problem: RH BLADDER ELIMINATION Goal: RH STG MANAGE BLADDER WITH ASSISTANCE Description STG Manage Bladder With min Assistance  Outcome: Progressing   Problem: RH SKIN INTEGRITY Goal: RH STG SKIN FREE OF INFECTION/BREAKDOWN Outcome: Progressing Goal: RH STG MAINTAIN SKIN INTEGRITY WITH ASSISTANCE Description STG Maintain Skin Integrity With min Assistance.  Outcome: Progressing   Problem: RH SAFETY Goal: RH STG ADHERE TO SAFETY PRECAUTIONS W/ASSISTANCE/DEVICE Description STG Adhere to Safety Precautions With min Assistance/Device.  Outcome: Progressing   Problem: RH PAIN MANAGEMENT Goal: RH STG PAIN MANAGED AT OR BELOW PT'S PAIN GOAL Description Less than 3 out of 10  Outcome: Progressing   Problem: RH KNOWLEDGE DEFICIT GENERAL Goal: RH STG INCREASE KNOWLEDGE OF SELF CARE AFTER HOSPITALIZATION Outcome: Progressing   Problem: Consults Goal: RH GENERAL PATIENT EDUCATION Description See Patient Education module for education specifics. Outcome: Progressing

## 2018-12-22 ENCOUNTER — Inpatient Hospital Stay (HOSPITAL_COMMUNITY): Payer: Self-pay | Admitting: Physical Therapy

## 2018-12-22 NOTE — Progress Notes (Signed)
CATHERINE OAK is a 68 y.o. male [birth year] [id number]  Subjective: No new complaints. No new problems. Slept well. Feeling OK.  Objective: Vital signs in last 24 hours: Temp:  [98.1 F (36.7 C)-98.5 F (36.9 C)] 98.1 F (36.7 C) (03/21 0521) Pulse Rate:  [73-79] 74 (03/21 0521) Resp:  [16-18] 16 (03/21 0521) BP: (115-139)/(65-83) 139/83 (03/21 0521) SpO2:  [93 %-98 %] 94 % (03/21 0521) Weight change:  Last BM Date: 12/20/18  Intake/Output from previous day: 03/20 0701 - 03/21 0700 In: 600 [P.O.:600] Out: 825 [Urine:825] Last cbgs: CBG (last 3)  No results for input(s): GLUCAP in the last 72 hours.   Physical Exam General: No apparent distress. Obese  HEENT: not dry Lungs: Normal effort. Lungs clear to auscultation, no crackles or wheezes. Cardiovascular: Regular rate and rhythm, no edema Abdomen: S/NT/ND; BS(+) Musculoskeletal:  unchanged Neurological: No new neurological deficits Wounds: N/A    Skin: clear  Aging changes Mental state: Alert, cooperative    Lab Results: BMET    Component Value Date/Time   NA 135 12/19/2018 0454   K 3.8 12/19/2018 0454   CL 105 12/19/2018 0454   CO2 26 12/19/2018 0454   GLUCOSE 107 (H) 12/19/2018 0454   BUN 16 12/19/2018 0454   CREATININE 1.09 12/19/2018 0454   CALCIUM 8.5 (L) 12/19/2018 0454   GFRNONAA >60 12/19/2018 0454   GFRAA >60 12/19/2018 0454   CBC    Component Value Date/Time   WBC 12.2 (H) 12/17/2018 0915   RBC 3.25 (L) 12/17/2018 0915   HGB 9.7 (L) 12/17/2018 0915   HCT 29.6 (L) 12/17/2018 0915   PLT 379 12/17/2018 0915   MCV 91.1 12/17/2018 0915   MCH 29.8 12/17/2018 0915   MCHC 32.8 12/17/2018 0915   RDW 12.9 12/17/2018 0915   LYMPHSABS 1.0 12/17/2018 0915   MONOABS 1.3 (H) 12/17/2018 0915   EOSABS 0.0 12/17/2018 0915   BASOSABS 0.0 12/17/2018 0915    Studies/Results: No results found.  Medications: I have reviewed the patient's current medications.  Assessment/Plan:              1.   Left femoral neck fracture.  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  Weightbearing as tolerated.  Continue CIR 2.  DVT prophylaxis with Lovenox 3.  Pain control with Tylenol.  Oxycodone and Robaxin was stopped due to lethargy 4.  Depression.  On Celexa 20 mg a day.  Emotional support. 5.  Routine skin care 6.  Acute blood loss anemia.  Monitoring hemoglobin 7.  Parkinson's disease.  Continue with Sinemet, Comtan, Requip.  Status post neurology consultation 8.  Chronic constipation.  Continue with Linzess. 9.  Hyperglycemia.  Improved    Length of stay, days: 11  Walker Kehr , MD 12/22/2018, 11:53 AM

## 2018-12-22 NOTE — Progress Notes (Signed)
Physical Therapy Session Note  Patient Details  Name: [name] [name] MRN: [id number] Date of Birth: 1951/03/02  Today's Date: 12/22/2018 PT Individual Time: 1000-1045 PT Individual Time Calculation (min): 45 min   Short Term Goals: Week 2:  PT Short Term Goal 1 (Week 2): Pt will initiate stair training PT Short Term Goal 2 (Week 2): Pt will maintain dynamic standing balance w/ min assist PT Short Term Goal 3 (Week 2): Pt will ambulate 150' w/ CGA w/ LRAD  Skilled Therapeutic Interventions/Progress Updates:   Pt in w/c and agreeable to therapy, no c/o pain. Self-propelled w/c to therapy gym w/ BUEs to warm-up. Focused on stair negotiation this session. Negotiated 12 steps in total, 4 w/ bilateral rails, 8 w/ unilateral rail (R ascending and then L ascending). CGA-min assist overall w/ occasional HHA when utilizing 1 handrail. Verbal cues for technique and step-to pattern. Seated rest break after 8 steps before finishing w/ 4 more. Pt very eager to go home and asking if this gets him closer to going home. Educated him on his progress and continuing to make functional gains over next few days prior to team meeting on Wednesday. Returned to room total assist in w/c, ended session in w/c and all needs in reach.  Therapy Documentation Precautions:  Precautions Precautions: Fall Precaution Comments: history of parkinson's Restrictions Weight Bearing Restrictions: Yes LLE Weight Bearing: Weight bearing as tolerated  Therapy/Group: Individual Therapy  Flower Franko K Talon Witting 12/22/2018, 11:02 AM

## 2018-12-23 ENCOUNTER — Inpatient Hospital Stay (HOSPITAL_COMMUNITY): Payer: Self-pay

## 2018-12-23 NOTE — Progress Notes (Signed)
Physical Therapy Session Note  Patient Details  Name: [name] [name] MRN: [id number] Date of Birth: [birth year]  Today's Date: 12/23/2018 PT Individual Time: 1400-1500 PT Individual Time Calculation (min): 60 min   Short Term Goals: Week 2:  PT Short Term Goal 1 (Week 2): Pt will initiate stair training PT Short Term Goal 2 (Week 2): Pt will maintain dynamic standing balance w/ min assist PT Short Term Goal 3 (Week 2): Pt will ambulate 150' w/ CGA w/ LRAD  Skilled Therapeutic Interventions/Progress Updates:    Pt seated in w/c upon PT arrival, agreeable to therapy tx and denies pain. Therapist and wife discussed home set up including platform step + 90 deg L turn + 6 steps with L rail going up. Pt transported to the gym in w/c. Pt performed sit<>stand from w/c with mod assist and RW, pt ambulated 2 x 80 ft this session with RW and CGA working on endurance, verbal cues for upright posture. Pt performed 2 x 5 sit<>stands from elevated mat with RW and UE support, CGA-min assist with emphasis on techniques and power up. Pt ambulated to the steps and ascended/descended 4 steps with B rails this session step to pattern and 8 steps this session with B UE support on L rail, min assist-CGA. Pt ambulated x 120 ft with RW and CGA, cues for upright posture and increased cues with turn to sit for techniques. Pt transported back to room and left in w/c with needs in reach and chair alarm set.   Therapy Documentation Precautions:  Precautions Precautions: Fall Precaution Comments: history of parkinson's Restrictions Weight Bearing Restrictions: Yes LLE Weight Bearing: Weight bearing as tolerated    Therapy/Group: Individual Therapy  Netta Corrigan, PT, DPT 12/23/2018, 1:00 PM

## 2018-12-23 NOTE — Progress Notes (Signed)
[name] [name] is a 68 y.o. male 1951-08-27 [id number]  Subjective: No new complaints. No new problems. Slept well. Feeling OK.  Objective: Vital signs in last 24 hours: Temp:  [97.8 F (36.6 C)-98.2 F (36.8 C)] 98 F (36.7 C) (03/22 0353) Pulse Rate:  [68-80] 68 (03/22 0353) Resp:  [16-18] 16 (03/22 0353) BP: (109-114)/(61-69) 114/69 (03/22 0353) SpO2:  [93 %-99 %] 93 % (03/22 0353) Weight change:  Last BM Date: 12/21/18  Intake/Output from previous day: 03/21 0701 - 03/22 0700 In: 478 [P.O.:478] Out: 1275 [Urine:1275] Last cbgs: CBG (last 3)  No results for input(s): GLUCAP in the last 72 hours.   Physical Exam General: No apparent distress.  Obese HEENT: not dry Lungs: Normal effort. Lungs clear to auscultation, no crackles or wheezes. Cardiovascular: Regular rate and rhythm, no edema Abdomen: S/NT/ND; BS(+) Musculoskeletal:  unchanged Neurological: No new neurological deficits Wounds: N/A    Skin: clear  Aging changes Mental state: Alert, cooperative    Lab Results: BMET    Component Value Date/Time   NA 135 12/19/2018 0454   K 3.8 12/19/2018 0454   CL 105 12/19/2018 0454   CO2 26 12/19/2018 0454   GLUCOSE 107 (H) 12/19/2018 0454   BUN 16 12/19/2018 0454   CREATININE 1.09 12/19/2018 0454   CALCIUM 8.5 (L) 12/19/2018 0454   GFRNONAA >60 12/19/2018 0454   GFRAA >60 12/19/2018 0454   CBC    Component Value Date/Time   WBC 12.2 (H) 12/17/2018 0915   RBC 3.25 (L) 12/17/2018 0915   HGB 9.7 (L) 12/17/2018 0915   HCT 29.6 (L) 12/17/2018 0915   PLT 379 12/17/2018 0915   MCV 91.1 12/17/2018 0915   MCH 29.8 12/17/2018 0915   MCHC 32.8 12/17/2018 0915   RDW 12.9 12/17/2018 0915   LYMPHSABS 1.0 12/17/2018 0915   MONOABS 1.3 (H) 12/17/2018 0915   EOSABS 0.0 12/17/2018 0915   BASOSABS 0.0 12/17/2018 0915    Studies/Results: No results found.  Medications: I have reviewed the patient's current medications.  Assessment/Plan:  1.  Left femoral  neck fracture.  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  Weightbearing as tolerated.  Continue with CIR 2.  DVT prophylaxis-Lovenox 3.  Pain control with Tylenol.  Oxycodone and Robaxin were stopped due to lethargy. 4.  Depression.  Currently on Celexa 20 mg daily.  Emotional support 5.  Routine skin care 6.  Acute blood loss anemia.  Monitor CBC 7.  Parkinson's disease.  Continue with Sinemet, Comtan, Requip.  Status post neurology consultation 8.  Chronic constipation.  Linzess to continue 9.  Hyperglycemia.  Improved       Length of stay, days: 12  Walker Kehr , MD 12/23/2018, 11:54 AM

## 2018-12-24 ENCOUNTER — Inpatient Hospital Stay (HOSPITAL_COMMUNITY): Payer: Self-pay | Admitting: Occupational Therapy

## 2018-12-24 ENCOUNTER — Inpatient Hospital Stay (HOSPITAL_COMMUNITY): Payer: Self-pay

## 2018-12-24 LAB — CBC WITH DIFFERENTIAL/PLATELET
Abs Immature Granulocytes: 0.13 10*3/uL — ABNORMAL HIGH (ref 0.00–0.07)
Basophils Absolute: 0 10*3/uL (ref 0.0–0.1)
Basophils Relative: 0 %
Eosinophils Absolute: 0.2 10*3/uL (ref 0.0–0.5)
Eosinophils Relative: 2 %
HCT: 32.7 % — ABNORMAL LOW (ref 39.0–52.0)
Hemoglobin: 10.3 g/dL — ABNORMAL LOW (ref 13.0–17.0)
Immature Granulocytes: 2 %
Lymphocytes Relative: 14 %
Lymphs Abs: 1.1 10*3/uL (ref 0.7–4.0)
MCH: 29.3 pg (ref 26.0–34.0)
MCHC: 31.5 g/dL (ref 30.0–36.0)
MCV: 93.2 fL (ref 80.0–100.0)
Monocytes Absolute: 0.6 10*3/uL (ref 0.1–1.0)
Monocytes Relative: 7 %
Neutro Abs: 6.2 10*3/uL (ref 1.7–7.7)
Neutrophils Relative %: 75 %
PLATELETS: 421 10*3/uL — AB (ref 150–400)
RBC: 3.51 MIL/uL — ABNORMAL LOW (ref 4.22–5.81)
RDW: 13 % (ref 11.5–15.5)
WBC: 8.2 10*3/uL (ref 4.0–10.5)
nRBC: 0 % (ref 0.0–0.2)

## 2018-12-24 LAB — BASIC METABOLIC PANEL
Anion gap: 10 (ref 5–15)
BUN: 14 mg/dL (ref 8–23)
CO2: 23 mmol/L (ref 22–32)
Calcium: 9 mg/dL (ref 8.9–10.3)
Chloride: 103 mmol/L (ref 98–111)
Creatinine, Ser: 1.09 mg/dL (ref 0.61–1.24)
GFR calc Af Amer: >60 mL/min (ref >60)
GLUCOSE: 136 mg/dL — AB (ref 70–99)
Potassium: 3.7 mmol/L (ref 3.5–5.1)
Sodium: 136 mmol/L (ref 135–145)

## 2018-12-24 NOTE — Progress Notes (Signed)
Occupational Therapy Session Note  Patient Details  Name: [name] [name] MRN: [id number] Date of Birth: [birth year]  Today's Date: 12/24/2018 OT Individual Time: 3664-4034 OT Individual Time Calculation (min): 28 min     Skilled Therapeutic Interventions/Progress Updates:    Pt began session with sit to stand from the wheelchair.  When attempting to stand with one hand on the RW and one hand on the arm rest, he needed mod assist.  When he was cued to push up from the wheelchair with both hands, he was able to complete with min assist to min guard.  Pt with slight bladder incontinence per report, so brief was changed while standing.  He was able to manage pulling down his shorts and then pulling them back up with min assist after therapist gave total assist for donning brief.  He then ambulated down to the ortho gym with min guard assist.  Mod demonstrational cueing to maintain upright posture with lumbar and cervical extension.  Once in the gym, therapist performed slight stretching to the right lateral neck musculature as he keeps his head flexed and tilted to the right at rest and with mobility.  Finished session with ambulation back to the room with use of the RW for min guard assist.  Pt left in the wheelchair with PT present to take over.    Therapy Documentation Precautions:  Precautions Precautions: Fall Precaution Comments: history of parkinson's Restrictions Weight Bearing Restrictions: No LLE Weight Bearing: Weight bearing as tolerated General:   Vital Signs: Therapy Vitals Temp: 98.2 F (36.8 C) Temp Source: Oral Pulse Rate: 82 Resp: 17 BP: 115/67 Patient Position (if appropriate): Sitting Oxygen Therapy SpO2: 100 % O2 Device: Room Air Pain: Pain Assessment Pain Scale: Faces Pain Score: 0-No pain ADL: ADL Eating: Supervision/safety Where Assessed-Eating: Wheelchair Grooming: Supervision/safety Where Assessed-Grooming: Wheelchair Upper Body Bathing: Maximal  assistance Where Assessed-Upper Body Bathing: Wheelchair Lower Body Bathing: Dependent Where Assessed-Lower Body Bathing: Wheelchair Upper Body Dressing: Maximal assistance Where Assessed-Upper Body Dressing: Wheelchair Lower Body Dressing: Dependent Where Assessed-Lower Body Dressing: Wheelchair Toileting: Dependent Where Assessed-Toileting: Bedside Commode Toilet Transfer: Dependent(use of stedy) Toilet Transfer Method: Other (comment)(Stedy) Science writer: Art gallery manager    Praxis   Exercises:   Other Treatments:     Therapy/Group: Individual Therapy  Donald Memoli 12/24/2018, 2:22 PM

## 2018-12-24 NOTE — Progress Notes (Signed)
Occupational Therapy Session Note  Patient Details  Name: [name] [name] MRN: [id number] Date of Birth: 05-May-1951  Today's Date: 12/24/2018 OT Individual Time: 1100-1200 OT Individual Time Calculation (min): 60 min   Short Term Goals: Week 2:  OT Short Term Goal 1 (Week 2): Pt will complete toilet transfers with min assist RW to 3:1 OT Short Term Goal 2 (Week 2): Pt will complete LB dressing with mod assist sit to stand and AE PRN.  OT Short Term Goal 3 (Week 2): Pt will complete LB bathing with min assist sit to stand.   OT Short Term Goal 4 (Week 2): Pt will manage clothing with min assist sit to stand.    Skilled Therapeutic Interventions/Progress Updates:    Pt greeted sitting in wc after recently finishing with PT and agreeable to OT treatment session. Pt reports he feels the need to have a BM. Pt completed stand-pivot to raised commode over toilet with min A and increased time to initiate. Assistance for clothing management. Pt used urinal while seated on commode and voided bladder. Pt with small smear of loose BM, requiring OT assist for peri-care. Pt then ambulated to shower bench with RW, increased time, and verbal cues to locate bench. Issued pt on long handled sponge and pt was able to wash back and feet today. Stand-pivot out of shower with heavy use of grab bars, verbal cues for motor planning and min A overall.  OT assisted with threading pant legs for time management, but worked on multiple sit<>stands and standing balance when pulling pants up over hips. Pt left seated in wc at end of session with safety belt on and needs met.   Therapy Documentation Precautions:  Precautions Precautions: Fall Precaution Comments: history of parkinson's Restrictions Weight Bearing Restrictions: Yes LLE Weight Bearing: Weight bearing as tolerated Pain: Pain Assessment Pain Scale: 0-10 Pain Score: 0-No pain   Therapy/Group: Individual Therapy  [name] 12/24/2018, 12:19  PM

## 2018-12-24 NOTE — Progress Notes (Signed)
Physical Therapy Session Note  Patient Details  Name: [name] [name] MRN: [id number] Date of Birth: 29-Oct-1950  Today's Date: 12/24/2018 PT Individual Time: 1415-1530 PT Individual Time Calculation (min): 75 min   Short Term Goals: Week 2:  PT Short Term Goal 1 (Week 2): Pt will initiate stair training PT Short Term Goal 2 (Week 2): Pt will maintain dynamic standing balance w/ min assist PT Short Term Goal 3 (Week 2): Pt will ambulate 150' w/ CGA w/ LRAD  Skilled Therapeutic Interventions/Progress Updates:    Patient seated in w/c finishing OT session. Sit > stand CGA increased time, pt requesting PT not to help.  Gait to ortho gym.  Completed car transfer with mod A for both LE's, much increased time and maximal cues. Patient performed standing balance activities reaching for and tossing horseshoes without UE support with CGA/S.  Performed seated postural activity with green therapy ball reaching to floor to retrieve and lifting up overhead with pt looking at ball throughout x 5 reps.  Patient ambulated with [facility] x 70' min A with cues for step width, step length, foot clearance and posture.  With [facility] noted slight improvement due to feet not so close, but remains with heavy footfalls, flexed posture and high fall risk with ever decreasing foot clearance.  Patient requesting to toilet so gait to room with RW and CGA cues for posture throughout.  Patient with partial incontinence in brief but used urinal to complete void.  Max A to change brief.  Patient ambulated another 15' with SPC and min A continues with same gait deviations as previously noted.  Educated in fall prevention including using walker at home, footwear, lighting, sturdy railings for stairs, grabbar for shower, non-slip shower surface and rubber backed mat for stepping out of surface.  Also discussed improving confidence to reduce fall risk as fear of falling increases risk.  Patient verbalized understanding of all.  Left in w/c with  alarm belt and call button in reach.   Therapy Documentation Precautions:  Precautions Precautions: Fall Precaution Comments: history of parkinson's Restrictions Weight Bearing Restrictions: No LLE Weight Bearing: Weight bearing as tolerated Pain: Pain Assessment Pain Scale: Faces Pain Score: 0-No pain    Therapy/Group: Individual Therapy  Reginia Naas  Magda Kiel, PT 12/24/2018, 4:44 PM

## 2018-12-24 NOTE — Progress Notes (Signed)
Physical Therapy Session Note  Patient Details  Name: [name] [name] MRN: [id number] Date of Birth: 1950/10/13  Today's Date: 12/24/2018 PT Individual Time: 1000-1035 PT Individual Time Calculation (min): 35 min   Short Term Goals: Week 2:  PT Short Term Goal 1 (Week 2): Pt will initiate stair training PT Short Term Goal 2 (Week 2): Pt will maintain dynamic standing balance w/ min assist PT Short Term Goal 3 (Week 2): Pt will ambulate 150' w/ CGA w/ LRAD  Skilled Therapeutic Interventions/Progress Updates:    Pt expressing having a "lousy" day due to being in the hospital and vebralized being depressed about visitor restrictions. Emotional support provided throughout session and encouraged patient to engage in goal direction of session. Discussed recommendation for neuropsych and pt eager (notifed CSW for follow up).  Pt request to use urinal (declines going into bathroom at this time because "it just doesn't matter."). Total assist for positioning and brief noted to be wet (pt with + void into urinal also). Total assist to change brief, focusing on sit <> stands and sit to squat due to pt unable to stand sucessfully on these attempts. Focusing on sit <> stands with RW with focus on technique - initially requiring multiple attempts with mod assist to complete, but after 3 reps, pt able to perform consistently with min assist progressing to Sarah Ann. In standing,focused on upright posture through trunk, marching, functional balance with 1 UE support and mini squats (x 5 reps) for functional strengthening and carryover to mobility. Pt reports it is always difficult to get started first thing but then mobility improves. Educated on how to transfer this to home by making sure that he does not stay seated for more than 30-45 min at a time before he gets up to do some kind of functional mobility (ex. During tv shows, get up and do a lap around the couch or walk to the kitchen, etc). Pt verbalized  understanding and in agreement.    Therapy Documentation Precautions:  Precautions Precautions: Fall Precaution Comments: history of parkinson's Restrictions Weight Bearing Restrictions: Yes LLE Weight Bearing: Weight bearing as tolerated    Pain:  Denies pain. Reports occasional 1/10 pain in LLE with certain movements.   Therapy/Group: Individual Therapy  Canary Brim Ivory Broad, PT, DPT, CBIS  12/24/2018, 10:48 AM

## 2018-12-24 NOTE — Progress Notes (Signed)
Occupational Therapy Weekly Progress Note  Patient Details  Name: [name] [name] MRN: [id number] Date of Birth: [birth year]  Beginning of progress report period: December 12, 2018 End of progress report period: December 24, 2018   Patient has met 4 of 4 short term goals. Pt has made steady progress towards OT goals this week. Pt has progressed with sit<>stands and functional transfers to an overall min A level. Pt occasionally is fatigued and can require mod A, but is more consistent. Pt is progressing with LB BADL tasks to a min/mod A level pending fatigue. Continue current POC.  Patient continues to demonstrate the following deficits: muscle weakness, decreased actviity tolerance, decreased initiation, decreased safety awareness and delayed processing and decreased standing balance, decreased postural control, decreased balance strategies and difficulty maintaining precautions and therefore will continue to benefit from skilled OT intervention to enhance overall performance with BADL.  Patient progressing toward long term goals..  Continue plan of care.  OT Short Term Goals Week 2:  OT Short Term Goal 1 (Week 2): Pt will complete toilet transfers with min assist RW to 3:1 OT Short Term Goal 1 - Progress (Week 2): Met OT Short Term Goal 2 (Week 2): Pt will complete LB dressing with mod assist sit to stand and AE PRN.  OT Short Term Goal 2 - Progress (Week 2): Met OT Short Term Goal 3 (Week 2): Pt will complete LB bathing with min assist sit to stand.   OT Short Term Goal 3 - Progress (Week 2): Met OT Short Term Goal 4 (Week 2): Pt will manage clothing with min assist sit to stand.   OT Short Term Goal 4 - Progress (Week 2): Met Week 3:  OT Short Term Goal 1 (Week 3): Pt will complete LB dressing with min A using ADL AE OT Short Term Goal 2 (Week 3): Pt will complete 1/3 toileting steps OT Short Term Goal 3 (Week 3): Pt tolerate standing at the sink for 2 mins in preparation for BADL  tasks  Skilled Therapeutic Interventions/Progress Updates:      Therapy Documentation Precautions:  Precautions Precautions: Fall Precaution Comments: history of parkinson's Restrictions Weight Bearing Restrictions: Yes LLE Weight Bearing: Weight bearing as tolerated   Therapy/Group: Individual Therapy  [name] 12/24/2018, 12:36 PM

## 2018-12-24 NOTE — Progress Notes (Signed)
[place] PHYSICAL MEDICINE & REHABILITATION PROGRESS NOTE  Subjective/Complaints: Patient seen sitting up in his chair eating breakfast this morning.  He states he slept well overnight.  He states he does not feel well this morning and did not have a good weekend because he is not allowed to have visitors.  ROS: Denies CP, SOB, N/V/D   Objective: Vital Signs: Blood pressure 119/72, pulse 74, temperature 98.7 F (37.1 C), temperature source Oral, resp. rate 19, weight (!) 140 kg, SpO2 96 %. No results found. No results for input(s): WBC, HGB, HCT, PLT in the last 72 hours. No results for input(s): NA, K, CL, CO2, GLUCOSE, BUN, CREATININE, CALCIUM in the last 72 hours.  Physical Exam: BP 119/72 (BP Location: Right Arm)   Pulse 74   Temp 98.7 F (37.1 C) (Oral)   Resp 19   Wt (!) 140 kg   SpO2 96%   BMI 39.63 kg/m  Constitutional: No distress . Vital signs reviewed. HENT: Normocephalic.  Atraumatic. Eyes: EOMI. No discharge. Cardiovascular: RRR.  No JVD. Respiratory: CTA bilaterally.  Normal effort. GI: BS +. Non-distended. Musc: No edema or tenderness in extremities. Musculoskeletal: Edema and tenderness in left hip, improving Neurological:Alert and oriented Mask facies.  Dysarthria.  Motor:  LLE: remains Limited by pain, hip flexion, knee extension 2+-3-/5, ankle dorsiflexion 4/5, improving RLE: 4+/5 proximal to distal, stable Skin: left hip incision c/d/i Psychiatric: flat  Assessment/Plan: 1. Functional deficits secondary to left hip fracture with history of Parkinson's disease which require 3+ hours per day of interdisciplinary therapy in a comprehensive inpatient rehab setting.  Physiatrist is providing close team supervision and 24 hour management of active medical problems listed below.  Physiatrist and rehab team continue to assess barriers to discharge/monitor patient progress toward functional and medical goals  Care Tool:  Bathing    Body parts bathed  by patient: Right arm, Left arm, Chest, Abdomen, Face, Front perineal area, Right upper leg, Left upper leg   Body parts bathed by helper: Right lower leg, Left lower leg, Buttocks     Bathing assist Assist Level: Moderate Assistance - Patient 50 - 74%     Upper Body Dressing/Undressing Upper body dressing   What is the patient wearing?: Pull over shirt    Upper body assist Assist Level: Minimal Assistance - Patient > 75%    Lower Body Dressing/Undressing Lower body dressing      What is the patient wearing?: Pants     Lower body assist Assist for lower body dressing: Maximal Assistance - Patient 25 - 49%     Toileting Toileting Toileting Activity did not occur Landscape architect and hygiene only): Safety/medical concerns  Toileting assist Assist for toileting: 2 Helpers     Transfers Chair/bed transfer  Transfers assist  Chair/bed transfer activity did not occur: Safety/medical concerns  Chair/bed transfer assist level: Minimal Assistance - Patient > 75%     Locomotion Ambulation   Ambulation assist   Ambulation activity did not occur: Safety/medical concerns  Assist level: Contact Guard/Touching assist Assistive device: Walker-rolling Max distance: 100 ft   Walk 10 feet activity   Assist  Walk 10 feet activity did not occur: Safety/medical concerns  Assist level: Contact Guard/Touching assist Assistive device: Walker-rolling   Walk 50 feet activity   Assist Walk 50 feet with 2 turns activity did not occur: Safety/medical concerns  Assist level: Contact Guard/Touching assist Assistive device: Walker-rolling    Walk 150 feet activity   Assist Walk 150 feet activity  did not occur: Safety/medical concerns  Assist level: Contact Guard/Touching assist Assistive device: Walker-rolling    Walk 10 feet on uneven surface  activity   Assist Walk 10 feet on uneven surfaces activity did not occur: Safety/medical concerns          Wheelchair     Assist   Type of Wheelchair: Manual    Wheelchair assist level: Supervision/Verbal cueing Max wheelchair distance: 150'    Wheelchair 50 feet with 2 turns activity    Assist        Assist Level: Supervision/Verbal cueing   Wheelchair 150 feet activity     Assist     Assist Level: Supervision/Verbal cueing      Medical Problem List and Plan: 1.Decreased functional mobilitysecondary to Left femoral neck fracture. 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 Weightbearing as tolerated  Continue CIR  Weekend notes reviewed 2. Antithrombotics: -DVT/anticoagulation:Subcutaneous Lovenox. vascular study negative for DVT -antiplatelet therapy: N/A 3. Pain Management:pain under reasonable control  Scheduled tylenol, local ice/ heat  Have stopped Oxy and Robaxin due to lethargy 4. Mood:Wellbutrin 150 mg daily and 300 mg daily at bedtime  Celexa 40 mg daily, decreased to 20 mg on 3/11 due with prolonged QT  Provide emotional support  Depressed, appreciate neuropsych eval  Stable  Antipsychotic agents: no antipsychotics given PD 5. Neuropsych: This patientisnot fully capable of making decisions on hisown behalf. 6. Skin/Wound Care:Routine skin checks 7. Fluids/Electrolytes/Nutrition:Routine in and out's   BMP within acceptable range on 3/23  P.o. intake improving  Advanced to regular texture thin liquids with supervision, now intermittent 8. Acute blood loss anemia.   Hemoglobin 9.7 on 3/16  Labs pending  Continue to monitor 9. Parkinson disease. Sinemet 25-100 1-2 tablets 3 times a day, Comtan 200 mg 3 times a day,Requip 1 mg 3 times a day,  Seen by Neurology- trial Ativan without results, appreciate recs 10. Chronic idiopathic constipation. Linzess290 mcg daily  -colace stopped per wife request 11. Overactive bladder. Toviaz 4 mg daily 12.  Hyperglycemia  Improved. No longer checking  CBG's 13.  Leukocytosis  WBCs 12.2 on 3/16  Labs pending  UA relatively unremarkable, urine culture NG  Ammonia WNL  Chest x-ray   without acute changes  Afebrile 14.  Hypoalbuminemia  Supplement initiated on 3/11  15.  Labile blood pressure   Controlled on 3/20  LOS: 13 days A FACE TO FACE EVALUATION WAS PERFORMED  Sandee Bernath Lorie Phenix 12/24/2018, 9:49 AM

## 2018-12-25 ENCOUNTER — Inpatient Hospital Stay (HOSPITAL_COMMUNITY): Payer: Self-pay

## 2018-12-25 ENCOUNTER — Inpatient Hospital Stay (HOSPITAL_COMMUNITY): Payer: Self-pay | Admitting: Physical Therapy

## 2018-12-25 NOTE — Progress Notes (Signed)
Physical Therapy Session Note  Patient Details  Name: [name] [name] MRN: [id number] Date of Birth: [birth year]  Today's Date: 12/25/2018 PT Individual Time: 0900-0933 PT Individual Time Calculation (min): 33 min   Short Term Goals: Week 2:  PT Short Term Goal 1 (Week 2): Pt will initiate stair training PT Short Term Goal 2 (Week 2): Pt will maintain dynamic standing balance w/ min assist PT Short Term Goal 3 (Week 2): Pt will ambulate 150' w/ CGA w/ LRAD  Skilled Therapeutic Interventions/Progress Updates:    Patient seated in w/c in room.  Reports feeling tired despite still early morning.  Noted pt wearing brief only so obtained shorts as directed per pt.  Min to mod A for donning with using reacher with increased time and mod cues.  Patient sit to stand initially unsuccessful despite cues for foot position, second attempt with min A and assisted to pull up shorts, but pt reported brief soiled so assisted to doff shorts to remove soiled brief.  Pt in standing while obtained cloth for cleaning and clean brief.  Patient washed front perineal area, PT assisted to wash back. Pt requesting to void so obtained urinal and pt able to void while standing.  Donned brief total A and pt donned shorts min A in standing.  Pt stand to sit increased time, cues and S.  Total A to don TED hose, pt donned shoes with mod A.  Patient sit to stand with S x 2 more trials.  Gait x 100' with CGA with RW.  Left seated in w/c with alarm belt and call bell/bedside table in reach.   Therapy Documentation Precautions:  Precautions Precautions: Fall Precaution Comments: history of parkinson's Restrictions Weight Bearing Restrictions: No LLE Weight Bearing: Weight bearing as tolerated Pain: Pain Assessment Pain Scale: 0-10 Pain Score: 0-No pain   Therapy/Group: Individual Therapy  Reginia Naas  Magda Kiel, PT 12/25/2018, 12:53 PM

## 2018-12-25 NOTE — Plan of Care (Signed)
  Problem: RH BOWEL ELIMINATION Goal: RH STG MANAGE BOWEL WITH ASSISTANCE Description STG Manage Bowel with min Assistance.  Outcome: Progressing   Problem: RH BLADDER ELIMINATION Goal: RH STG MANAGE BLADDER WITH ASSISTANCE Description STG Manage Bladder With min Assistance  Outcome: Progressing   Problem: RH SKIN INTEGRITY Goal: RH STG SKIN FREE OF INFECTION/BREAKDOWN Outcome: Progressing Goal: RH STG MAINTAIN SKIN INTEGRITY WITH ASSISTANCE Description STG Maintain Skin Integrity With min Assistance.  Outcome: Progressing   Problem: RH SAFETY Goal: RH STG ADHERE TO SAFETY PRECAUTIONS W/ASSISTANCE/DEVICE Description STG Adhere to Safety Precautions With min Assistance/Device.  Outcome: Progressing   Problem: RH PAIN MANAGEMENT Goal: RH STG PAIN MANAGED AT OR BELOW PT'S PAIN GOAL Description Less than 3 out of 10  Outcome: Progressing   Problem: RH KNOWLEDGE DEFICIT GENERAL Goal: RH STG INCREASE KNOWLEDGE OF SELF CARE AFTER HOSPITALIZATION Outcome: Progressing   Problem: Consults Goal: RH GENERAL PATIENT EDUCATION Description See Patient Education module for education specifics. Outcome: Progressing

## 2018-12-25 NOTE — Progress Notes (Signed)
[place] PHYSICAL MEDICINE & REHABILITATION PROGRESS NOTE  Subjective/Complaints: Patient seen laying in bed this morning.  He states he slept well overnight.  He denies complaints.  ROS: Denies CP, SOB, N/V/D   Objective: Vital Signs: Blood pressure 133/75, pulse 73, temperature 98.7 F (37.1 C), temperature source Oral, resp. rate 12, weight (!) 140 kg, SpO2 97 %. No results found. Recent Labs    12/24/18 1003  WBC 8.2  HGB 10.3*  HCT 32.7*  PLT 421*   Recent Labs    12/24/18 1003  NA 136  K 3.7  CL 103  CO2 23  GLUCOSE 136*  BUN 14  CREATININE 1.09  CALCIUM 9.0    Physical Exam: BP 133/75 (BP Location: Right Arm)   Pulse 73   Temp 98.7 F (37.1 C) (Oral)   Resp 12   Wt (!) 140 kg   SpO2 97%   BMI 39.63 kg/m  Constitutional: No distress . Vital signs reviewed. HENT: Normocephalic.  Atraumatic. Eyes: EOMI. No discharge. Cardiovascular: RRR.  No JVD. Respiratory: CTA bilaterally.  Normal effort. GI: BS +. Non-distended. Musc: No edema or tenderness in extremities. Musculoskeletal: Edema and tenderness in left hip, improving Neurological:Alert and oriented Mask facies.  Dysarthria.  Motor:  LLE: remains Limited by pain, hip flexion, knee extension 4-/5, ankle dorsiflexion 4+/5 RLE: 4+/5 proximal to distal, unchanged Skin: left hip incision c/d/i Psychiatric: flat  Assessment/Plan: 1. Functional deficits secondary to left hip fracture with history of Parkinson's disease which require 3+ hours per day of interdisciplinary therapy in a comprehensive inpatient rehab setting.  Physiatrist is providing close team supervision and 24 hour management of active medical problems listed below.  Physiatrist and rehab team continue to assess barriers to discharge/monitor patient progress toward functional and medical goals  Care Tool:  Bathing    Body parts bathed by patient: Right arm, Left arm, Chest, Abdomen, Face, Front perineal area, Right upper leg,  Left upper leg   Body parts bathed by helper: Right lower leg, Left lower leg, Buttocks     Bathing assist Assist Level: Moderate Assistance - Patient 50 - 74%     Upper Body Dressing/Undressing Upper body dressing   What is the patient wearing?: Pull over shirt    Upper body assist Assist Level: Minimal Assistance - Patient > 75%    Lower Body Dressing/Undressing Lower body dressing      What is the patient wearing?: Pants     Lower body assist Assist for lower body dressing: Maximal Assistance - Patient 25 - 49%     Toileting Toileting Toileting Activity did not occur (Clothing management and hygiene only): Safety/medical concerns  Toileting assist Assist for toileting: Moderate Assistance - Patient 50 - 74%     Transfers Chair/bed transfer  Transfers assist  Chair/bed transfer activity did not occur: Safety/medical concerns  Chair/bed transfer assist level: Minimal Assistance - Patient > 75%     Locomotion Ambulation   Ambulation assist   Ambulation activity did not occur: Safety/medical concerns  Assist level: Minimal Assistance - Patient > 75% Assistive device: Cane-straight Max distance: 100'   Walk 10 feet activity   Assist  Walk 10 feet activity did not occur: Safety/medical concerns  Assist level: Minimal Assistance - Patient > 75% Assistive device: Cane-straight   Walk 50 feet activity   Assist Walk 50 feet with 2 turns activity did not occur: Safety/medical concerns  Assist level: Minimal Assistance - Patient > 75% Assistive device: Cane-straight  Walk 150 feet activity   Assist Walk 150 feet activity did not occur: Safety/medical concerns  Assist level: Contact Guard/Touching assist Assistive device: Walker-rolling    Walk 10 feet on uneven surface  activity   Assist Walk 10 feet on uneven surfaces activity did not occur: Safety/medical concerns         Wheelchair     Assist   Type of Wheelchair: Manual     Wheelchair assist level: Supervision/Verbal cueing Max wheelchair distance: 150'    Wheelchair 50 feet with 2 turns activity    Assist        Assist Level: Supervision/Verbal cueing   Wheelchair 150 feet activity     Assist     Assist Level: Supervision/Verbal cueing      Medical Problem List and Plan: 1.Decreased functional mobilitysecondary to Left femoral neck fracture. 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 Weightbearing as tolerated  Continue CIR 2. Antithrombotics: -DVT/anticoagulation:Subcutaneous Lovenox. vascular study negative for DVT -antiplatelet therapy: N/A 3. Pain Management:pain under reasonable control  Scheduled tylenol, local ice/ heat  Have stopped Oxy and Robaxin due to lethargy 4. Mood:Wellbutrin 150 mg daily and 300 mg daily at bedtime  Celexa 40 mg daily, decreased to 20 mg on 3/11 due with prolonged QT  Provide emotional support  Depressed, appreciate neuropsych eval  Stable  Antipsychotic agents: no antipsychotics given PD 5. Neuropsych: This patientisnot fully capable of making decisions on hisown behalf. 6. Skin/Wound Care:Routine skin checks 7. Fluids/Electrolytes/Nutrition:Routine in and out's   BMP within acceptable range on 3/23  P.o. intake improving  Advanced to regular texture thin liquids with supervision, now intermittent 8. Acute blood loss anemia.   Hemoglobin 10.3 on 3/23  Continue to monitor 9. Parkinson disease. Sinemet 25-100 1-2 tablets 3 times a day, Comtan 200 mg 3 times a day,Requip 1 mg 3 times a day,  Seen by Neurology- trial Ativan without results, appreciate recs 10. Chronic idiopathic constipation. Linzess290 mcg daily  -colace stopped per wife request 11. Overactive bladder. Toviaz 4 mg daily 12.  Hyperglycemia  Improved. No longer checking CBG's 13.  Leukocytosis  WBCs 8.2 on 3/23  UA relatively unremarkable, urine culture NG  Ammonia  WNL  Chest x-ray   without acute changes  Afebrile 14.  Hypoalbuminemia  Supplement initiated on 3/11  15.  Labile blood pressure  Relatively controlled on 3/24  LOS: 14 days A FACE TO FACE EVALUATION WAS PERFORMED  Ankit Lorie Phenix 12/25/2018, 9:07 AM

## 2018-12-25 NOTE — Progress Notes (Signed)
Occupational Therapy Session Note  Patient Details  Name: [name] [name] MRN: [id number] Date of Birth: 1950/12/09  Today's Date: 12/25/2018 OT Individual Time: 0730-0830 OT Individual Time Calculation (min): 60 min    Short Term Goals: Week 3:  OT Short Term Goal 1 (Week 3): Pt will complete LB dressing with min A using ADL AE OT Short Term Goal 2 (Week 3): Pt will complete 1/3 toileting steps OT Short Term Goal 3 (Week 3): Pt tolerate standing at the sink for 2 mins in preparation for BADL tasks  Skilled Therapeutic Interventions/Progress Updates:    Upon entering room pt in mid stand pivot transfer with NT Zach. Pt very anxious and freezing in transfer. Emotional support and calm demeanour used to ease pt and to encourage to complete transfer. Cueing provided for UE placement and technique. Pt has no c/o pain at beginning of session, however reiterating feelings of sadness d/t no visitor policy. Emotional support provided as needed throughout session. Pt set up to eat breakfast with intermittent (S). Pt reported feelings of "despair" and great sadness. Spoke with pt at length re self efficacy, motivating factors, and engagement in IADLs/ADLs using motivational interviewing techniques. Pt was given a small booklet to self reflect and journal. Pt was left sitting up in w/c with chair alarm belt fastened and all needs met.   Therapy Documentation Precautions:  Precautions Precautions: Fall Precaution Comments: history of parkinson's Restrictions Weight Bearing Restrictions: No LLE Weight Bearing: Weight bearing as tolerated Pain: Pain Assessment Pain Scale: 0-10 Pain Score: 0-No pain   Therapy/Group: Individual Therapy  Curtis Sites 12/25/2018, 10:58 AM

## 2018-12-25 NOTE — Progress Notes (Signed)
Physical Therapy Session Note  Patient Details  Name: [name] [name] MRN: [id number] Date of Birth: 07-25-1951  Today's Date: 12/25/2018 PT Individual Time: 1000-1110 AND 1330-1415 PT Individual Time Calculation (min): 70 min AND 45 min  Short Term Goals: Week 2:  PT Short Term Goal 1 (Week 2): Pt will initiate stair training PT Short Term Goal 2 (Week 2): Pt will maintain dynamic standing balance w/ min assist PT Short Term Goal 3 (Week 2): Pt will ambulate 150' w/ CGA w/ LRAD  Skilled Therapeutic Interventions/Progress Updates:   Session 1:  Pt in w/c and agreeable to therapy, no c/o pain. Pt self-propelled w/c to day room w/ BUEs, supervision. NuStep 10 min @ level 4 w/ all extremities for loosening up of stiffness from sitting. Worked on sit<>stands for functional strengthening from seated in w/c and from mat w/o UE support. CGA fading to close supervision w/ repetition from each surface. Pt w/ better carryover of technique w/ using UEs to push up, scooting forward, and anterior trunk weight shifting. Discussed surfaces at home that pt would need to get up from. Educated on only sitting on either firm surfaces w/o armrests such as a bench or any chair w/ armrests as long as it isn't too low to the ground. Pt verbalized understanding and states this is typically what he does already. Practiced car transfer at height of pt's car (27.5 inches), performed w/ CGA and verbal cues for technique and safety. Pt self-propelled w/c back to room, ended session in w/c and all needs in reach.   Session 2:  Pt in w/c and agreeable to therapy, no c/o pain. Total assist w/c transport to/from stairwell. Worked on stair negotiation in home environment this session. Set-up to simulate stairs to access home, as per wife's picture, performed w/ min assist to negotiate 8 rails w/ L rail ascending. Pt w/ a few episodes of Parkinsonian "freezing" of gait, able to overcome w/ increased time and encouragement.  Negotiated 8 steps w/ R rail ascending to simulate stairs to access 2nd floor, performed w/ CGA-min assist and increased confidence. Returned to room via w/c, ended session in w/c and all needs in reach.  Therapy Documentation Precautions:  Precautions Precautions: Fall Precaution Comments: history of parkinson's Restrictions Weight Bearing Restrictions: No LLE Weight Bearing: Weight bearing as tolerated  Therapy/Group: Individual Therapy  Ramello Cordial K Marcia Hartwell 12/25/2018, 11:11 AM

## 2018-12-26 ENCOUNTER — Inpatient Hospital Stay (HOSPITAL_COMMUNITY): Payer: Self-pay | Admitting: Occupational Therapy

## 2018-12-26 ENCOUNTER — Inpatient Hospital Stay (HOSPITAL_COMMUNITY): Payer: Self-pay

## 2018-12-26 ENCOUNTER — Encounter (HOSPITAL_COMMUNITY): Payer: Self-pay | Admitting: Psychology

## 2018-12-26 ENCOUNTER — Inpatient Hospital Stay (HOSPITAL_COMMUNITY): Payer: Self-pay | Admitting: Physical Therapy

## 2018-12-26 DIAGNOSIS — G8918 Other acute postprocedural pain: Secondary | ICD-10-CM

## 2018-12-26 NOTE — Consult Note (Signed)
Neuropsychological Consultation   Patient:   [name] [name]   DOB:   Feb 14, 1951  MR Number:  [id number]  Location:  [facility] [id number] [place] [address] Dept: [place]: [phone]           Date of Service:   12/26/2018  Start Time:   9 AM End Time:   10 AM  Provider/Observer:  Ilean Skill, Psy.D.       Clinical Neuropsychologist       Billing Code/Service: 96158/[id number]  Chief Complaint:    [name] [name] is [name] 68 year old male with history of Parkinson's disease followed by neurology services by Dr. Carles Collet.  Maintained on Sinemet, comtan.  Major Depressive Disorder is being followed by Dr. Adele Schilder and maintained on Wellbutrin and Celexa.  Presented on 12/08/2018 after fall while walking into garage.  Missed the last step.  Significant left hip pain.  Left femoral neck fracture.  Underwent total hip replacement by Dr. Erlinda Hong with anterior approach.  Patient was recommended for CIR program.  Patient had significant confusion and delirium following surgery that may be due to reactions to anesthesia in conjunction with medications and parkinson's.    12/26/2018:  Today the patient was alone without wife present.  Patient reported significant stress due to wife not being able to come see him.  He reports strong desire to go home as soon as possible.  Very fixated on this.  Concern that depressive could worsened but patient reports that with his current medication regimen from Dr. Adele Schilder and for Parkinson's that his has not had significant depressive symptoms lately.     Reason for Service:  GEX:BMWUXLK [name] [name] is [name] 68 year old right-handed male with history of Parkinson's disease followed by neurology services Dr. [name] Tat maintained on Sinemet 25-100 ,Comtan 200 mg 3 times [name] day, depression maintained on Wellbutrin 150 mg every morning and 300 mg every evening Celexa 40 mg daily,  chronic idiopathic constipation. Per chart review patient lives with supportive wife. Modified independent using [name] straight point cane occasionally. Two-level home with bedroom bath upstairs 8 steps to entry. Wife does assist with some basic ADLs. Presented 12/08/2018 after [name] fall while walking down into his garage. He states he missed the last step. He did not hit his head or black out. Patient noted significant left hip pain. X-rays and imaging revealed left femoral neck fracture. Underwent total hip replacement uncemented 12/09/2018 per Dr.Xu with anterior approach. Hospital course pain management and weightbearing as tolerated left lower extremity. Acute blood loss anemia 9.3. subcutaneous Lovenox for DVT prophylaxis. Therapy evaluations completed with recommendations of physical medicine rehabilitation consult. Patient was admitted for [name] comprehensive rehabilitation program.  Current Status:  The patient has increasing concern about how soon he is able to go home with increased concern now that wife can't come in to see him.   Behavioral Observation: [name] [name]  presents as [name] 68 y.o.-year-old Right Caucasian Male who appeared his stated age. his dress was Appropriate and he was Well Groomed and his manners were Appropriate to the situation.  his participation was indicative of Appropriate and Drowsy behaviors.  There were any physical disabilities noted.  he displayed an appropriate level of cooperation and motivation.     Interactions:    Active Appropriate and Drowsy  Attention:   abnormal and attention span appeared shorter than expected for age  Memory:   abnormal; remote memory  intact, recent memory impaired  Visuo-spatial:  not examined  Speech (Volume):  low  Speech:   normal; slowed response time  Thought Process:  Coherent and Relevant  Though Content:  WNL; not suicidal and not homicidal  Orientation:   person, place, time/date and  situation  Judgment:   Fair  Planning:   Fair  Affect:    Depressed  Mood:    Dysphoric  Insight:   Fair  Intelligence:   high  Medical History:   Past Medical History:  Diagnosis Date  . APPENDECTOMY, HX OF 02/18/2008   Qualifier: Diagnosis of  By: Danny Lawless CMA, [place]    . Cardiac murmur    as [name] child  . Chronic idiopathic constipation   . Depression   . Headache(784.0)   . HEADACHES, HX OF 02/18/2008   Qualifier: Diagnosis of  By: Danny Lawless CMA, [place]    . Parkinson disease ([place]) 11/2014  . Sinus mucosal thickening    pt unable to lay flat  . Streptococcal meningitis    as an infant       Psychiatric History:  The patient does have [name] history of significant major depressive disorder and is being followed by Dr. Adele Schilder with behavioral healt through [name]  Family Med/Psych History:  Family History  Problem Relation Age of Onset  . Lung cancer Father   . Alcohol abuse Brother   . Drug abuse Brother   . Seizures Daughter   . Colon cancer Neg Hx   . Stomach cancer Neg Hx   . Rectal cancer Neg Hx   . Esophageal cancer Neg Hx     Risk of Suicide/Violence: low patient denies any suicidal or homicidal ideation.  Impression/DX:  [name] [name] is [name] 68 year old male with history of Parkinson's disease followed by neurology services by Dr. Carles Collet.  Maintained on Sinemet, comtan.  Major Depressive Disorder is being followed by Dr. Adele Schilder and maintained on Wellbutrin and Celexa.  Presented on 12/08/2018 after fall while walking into garage.  Missed the last step.  Significant left hip pain.  Left femoral neck fracture.  Underwent total hip replacement by Dr. Erlinda Hong with anterior approach.  Patient was recommended for CIR program.  Patient had significant confusion and delirium following surgery that may be due to reactions to anesthesia in conjunction with medications and parkinson's.     The patient and wife report significant improvement in confusion and delirium.  The patient was  lethargic but oriented today.  Reported depressive symptoms, but this has been typical and patient is on both Wellbutrin and Celexa already. Wife was very concerned about the possibility of exacerbation of his Parkinson's disease post surgery and all of the difficulties he had.  However, she reports that now that medications are being given consistently and at the right timing and that he is further out from surgery that he is returning to baseline and his Parkinson symptoms have returned to baseline.  12/26/2018:  Today the patient was alone without wife present.  Patient reported significant stress due to wife not being able to come see him.  He reports strong desire to go home as soon as possible.  Very fixated on this.  Concern that depressive could worsened but patient reports that with his current medication regimen from Dr. Adele Schilder and for Parkinson's that his has not had significant depressive symptoms lately.   Disposition/Plan:  The patient has now been informed about his planned discharge on Saturday and while he would prefer to  go home even faster knows that there are still therapies that he needs to complete.        Electronically Signed   _______________________ Ilean Skill, Psy.D.

## 2018-12-26 NOTE — Progress Notes (Signed)
Occupational Therapy Session Note  Patient Details  Name: [name] [name] MRN: [id number] Date of Birth: Apr 04, 1951  Today's Date: 12/26/2018 OT Individual Time: 1330-1452 OT Individual Time Calculation (min): 82 min    Short Term Goals: Week 3:  OT Short Term Goal 1 (Week 3): Pt will complete LB dressing with min A using ADL AE OT Short Term Goal 2 (Week 3): Pt will complete 1/3 toileting steps OT Short Term Goal 3 (Week 3): Pt tolerate standing at the sink for 2 mins in preparation for BADL tasks  Skilled Therapeutic Interventions/Progress Updates:    Session focused on dynamic standing balance, BUE/LE coordination, and ADL transfers. Pt received sitting up in w/c with no c/o pain. Pt proudly showed OT his entries into booklet made for pt yesterday. Reviewed entries with pt and reinforced benefits of self-reflection. Pt declined participation in any ADLs despite encouragement. Pt was transported to therapy gym in w/c for time management. Throughout session pt completed several sit <> stand transfers with CGA. Pt had 1 instance of freezing during a stand > sit and then abruptly sat. Pt remained standing with varying levels of RW support and completed functional reaching forward and across midline with (S). Pt was challenged to reach distally, achieving a full squat x15 repetitions with CGA-min A. Pt frequently frustrated by therapists hand on back stating "I can do this!". Discussed safety for pt and therapist. Pt completed functional stepping activity with and without RW support, with 1 minor LOB. Pt completed dynamic reaching to the floor with min A x10 repetitions. Pt returned to room and completed standing level toileting with (S). Pt left sitting up in w/c with all needs met chair alarm set.   Therapy Documentation Precautions:  Precautions Precautions: Fall Precaution Comments: history of parkinson's Restrictions Weight Bearing Restrictions: No LLE Weight Bearing: Weight bearing as  tolerated Pain:  No pain reported   Therapy/Group: Individual Therapy  Curtis Sites 12/26/2018, 7:26 AM

## 2018-12-26 NOTE — Progress Notes (Signed)
Occupational Therapy Session Note  Patient Details  Name: [name] [name] MRN: [id number] Date of Birth: [birth year]  Today's Date: 12/26/2018 OT Individual Time: 2863-8177 OT Individual Time Calculation (min): 55 min    Short Term Goals: Week 3:OT Short Term Goal 1 (Week 3): Pt will complete LB dressing with min A using ADL AE OT Short Term Goal 2 (Week 3): Pt will complete 1/3 toileting steps OT Short Term Goal 3 (Week 3): Pt tolerate standing at the sink for 2 mins in preparation for BADL tasks Skilled Therapeutic Interventions/Progress Updates:    Pt seen for OT session focusing on functional transfers and self-feeding task. Pt sitting up in bed upon arrival with MD present completing AM [name]. Pt denied pain and agreeable to tx session, he denied need for bathing/dressing routine.Marland Kitchen He transferred to sitting EOB with min A using hospital bed functions. Requested toileting task, total A for clothing management and positioning of hand held urinal while pt sat EOB. Completed min A stand pivot transfer using RW standing from elevated EOB. VCs throughout for technique/sequencing of transfer.  Pt ate breakfast seated in w/c, able to open all needed containers and manage utensils independently.  Pt discussing increased anxiety in hospital setting and bery motivated to return home. Pt reports wife is home all day and able to assist as well as adult daughter who is [facility] LPN. OT made CSW aware of pt's desires. Therapist also provided extensive education to pt regarding therapy goals and considerations taken  When determining d/c date.  Pt desiring to work on determining meals for the day. Pt able to read food menu and hand wrote order for meals tomorrow.  Discussed at length coping strategies for pt's increased anxiety and returning to PLOF activities. Pt left seated in w/c at end of session, all needs in reach.   Therapy Documentation Precautions:  Precautions Precautions: Fall Precaution  Comments: history of parkinson's Restrictions Weight Bearing Restrictions: No LLE Weight Bearing: Weight bearing as tolerated Pain:   No/denies pain   Therapy/Group: Individual Therapy  Tamarah Bhullar L 12/26/2018, 6:58 AM

## 2018-12-26 NOTE — Progress Notes (Signed)
[facility] PHYSICAL MEDICINE & REHABILITATION PROGRESS NOTE  Subjective/Complaints: Patient seen lying in bed this morning.  He states he slept well overnight.  No reported issues overnight.  ROS: Denies CP, SOB, N/V/D   Objective: Vital Signs: Blood pressure 132/74, pulse 75, temperature 98.2 F (36.8 C), temperature source Oral, resp. rate 18, weight (!) 140 kg, SpO2 97 %. No results found. Recent Labs    12/24/18 1003  WBC 8.2  HGB 10.3*  HCT 32.7*  PLT 421*   Recent Labs    12/24/18 1003  NA 136  K 3.7  CL 103  CO2 23  GLUCOSE 136*  BUN 14  CREATININE 1.09  CALCIUM 9.0    Physical Exam: BP 132/74 (BP Location: Left Arm)   Pulse 75   Temp 98.2 F (36.8 C) (Oral)   Resp 18   Wt (!) 140 kg   SpO2 97%   BMI 39.63 kg/m  Constitutional: No distress . Vital signs reviewed. HENT: Normocephalic.  Atraumatic. Eyes: EOMI. No discharge. Cardiovascular: RRR.  No JVD. Respiratory: CTA bilaterally.  Normal effort. GI: BS +. Non-distended. Musc: No edema or tenderness in extremities. Musculoskeletal: Edema and tenderness in left hip, improving Neurological:Alert and oriented Mask facies, unchanged.  Dysarthria, unchanged.  Motor:  LLE: hip flexion, knee extension 4-/5, ankle dorsiflexion 4+/5, stable RLE: 4+/5 proximal to distal, stable Skin: left hip incision c/d/i Psychiatric: flat  Assessment/Plan: 1. Functional deficits secondary to left hip fracture with history of Parkinson's disease which require 3+ hours per day of interdisciplinary therapy in a comprehensive inpatient rehab setting.  Physiatrist is providing close team supervision and 24 hour management of active medical problems listed below.  Physiatrist and rehab team continue to assess barriers to discharge/monitor patient progress toward functional and medical goals  Care Tool:  Bathing    Body parts bathed by patient: Right arm, Left arm, Chest, Abdomen, Face, Front perineal area, Right  upper leg, Left upper leg   Body parts bathed by helper: Right lower leg, Left lower leg, Buttocks     Bathing assist Assist Level: Moderate Assistance - Patient 50 - 74%     Upper Body Dressing/Undressing Upper body dressing   What is the patient wearing?: Pull over shirt    Upper body assist Assist Level: Minimal Assistance - Patient > 75%    Lower Body Dressing/Undressing Lower body dressing      What is the patient wearing?: Pants, Incontinence brief     Lower body assist Assist for lower body dressing: Maximal Assistance - Patient 25 - 49%     Toileting Toileting Toileting Activity did not occur (Clothing management and hygiene only): Safety/medical concerns  Toileting assist Assist for toileting: Moderate Assistance - Patient 50 - 74%     Transfers Chair/bed transfer  Transfers assist  Chair/bed transfer activity did not occur: Safety/medical concerns  Chair/bed transfer assist level: Contact Guard/Touching assist     Locomotion Ambulation   Ambulation assist   Ambulation activity did not occur: Safety/medical concerns  Assist level: Contact Guard/Touching assist Assistive device: Walker-rolling Max distance: 100'   Walk 10 feet activity   Assist  Walk 10 feet activity did not occur: Safety/medical concerns  Assist level: Contact Guard/Touching assist Assistive device: Walker-rolling   Walk 50 feet activity   Assist Walk 50 feet with 2 turns activity did not occur: Safety/medical concerns  Assist level: Contact Guard/Touching assist Assistive device: Walker-rolling    Walk 150 feet activity   Assist Walk 150 feet  activity did not occur: Safety/medical concerns  Assist level: Contact Guard/Touching assist Assistive device: Walker-rolling    Walk 10 feet on uneven surface  activity   Assist Walk 10 feet on uneven surfaces activity did not occur: Safety/medical concerns         Wheelchair     Assist   Type of Wheelchair:  Manual    Wheelchair assist level: Supervision/Verbal cueing Max wheelchair distance: 150'    Wheelchair 50 feet with 2 turns activity    Assist        Assist Level: Supervision/Verbal cueing   Wheelchair 150 feet activity     Assist     Assist Level: Supervision/Verbal cueing      Medical Problem List and Plan: 1.Decreased functional mobilitysecondary to Left femoral neck fracture. 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 Weightbearing as tolerated  Continue CIR  Team conference today to discuss current and goals and coordination of care, home and environmental barriers, and discharge planning with nursing, case manager, and therapies.  2. Antithrombotics: -DVT/anticoagulation:Subcutaneous Lovenox. vascular study negative for DVT -antiplatelet therapy: N/A 3. Pain Management:  Scheduled tylenol, local ice/ heat  Have stopped Oxy and Robaxin due to lethargy  Controlled on 3/25 4. Mood:Wellbutrin 150 mg daily and 300 mg daily at bedtime  Celexa 40 mg daily, decreased to 20 mg on 3/11 due with prolonged QT  Provide emotional support  Depressed, appreciate neuropsych eval  Stable  Antipsychotic agents: no antipsychotics given PD 5. Neuropsych: This patientisnot fully capable of making decisions on hisown behalf. 6. Skin/Wound Care:Routine skin checks 7. Fluids/Electrolytes/Nutrition:Routine in and out's   BMP within acceptable range on 3/23  P.o. intake improving  Advanced to regular texture thin liquids with supervision, now intermittent 8. Acute blood loss anemia.   Hemoglobin 10.3 on 3/23  Continue to monitor 9. Parkinson disease. Sinemet 25-100 1-2 tablets 3 times a day, Comtan 200 mg 3 times a day,Requip 1 mg 3 times a day  Seen by Neurology- trial Ativan without results, appreciate recs 10. Chronic idiopathic constipation. Linzess290 mcg daily  -colace stopped per wife request 11. Overactive  bladder. Toviaz 4 mg daily 12.  Hyperglycemia  Improved. No longer checking CBG's 13.  Leukocytosis  WBCs 8.2 on 3/23  UA relatively unremarkable, urine culture NG  Ammonia WNL  Chest x-ray without acute changes  Afebrile 14.  Hypoalbuminemia  Supplement initiated on 3/11  15.  Labile blood pressure  Controlled on 3/25  LOS: 15 days A FACE TO FACE EVALUATION WAS PERFORMED  Ankit Lorie Phenix 12/26/2018, 8:41 AM

## 2018-12-26 NOTE — Patient Care Conference (Signed)
Inpatient RehabilitationTeam Conference and Plan of Care Update Date: 12/26/2018   Time: 2:30 PM    Patient Name: [name] [name]      Medical Record Number: [id number]  Date of Birth: 09-21-1951 Sex: Male         Room/Bed: 4M07C/4M07C-01 Payor Info: Payor: MEDICARE / Plan: MEDICARE PART A AND B / Product Type: *No Product type* /    Admitting Diagnosis: l hip fx sp total arthroplasty  Admit Date/Time:  12/11/2018  3:09 PM Admission Comments: No comment available   Primary Diagnosis:  <principal problem not specified> Principal Problem: <principal problem not specified>  Patient Active Problem List   Diagnosis Date Noted  . Postoperative pain   . AMS (altered mental status)   . Labile blood pressure   . Dysphagia   . Acute blood loss anemia   . Leukocytosis   . Episode of recurrent major depressive disorder ([place])   . Hypoalbuminemia due to protein-calorie malnutrition ([place])   . Left displaced femoral neck fracture ([place]) 12/11/2018  . Closed fracture of neck of left femur ([place])   . Closed left hip fracture ([place]) 12/08/2018  . Left foot pain 07/26/2018  . TMJ (temporomandibular joint syndrome) 10/10/2017  . Parkinson's disease (tremor, stiffness, slow motion, unstable posture) ([place]) 03/20/2017  . Bilateral lower extremity edema 03/20/2017  . Chronic venous insufficiency 09/13/2016  . Fatigue 03/15/2016  . Parkinsonian features 09/15/2015  . ED (erectile dysfunction) 09/15/2015  . MDD (major depressive disorder), recurrent episode, severe ([place]) 02/10/2014  . Nonspecific abnormal electrocardiogram (ECG) (EKG) 02/07/2014  . Non-compliant behavior 02/03/2014  . Morton's neuroma of left foot 08/07/2013  . 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 Hyperglycemia 01/26/2012  . Hypogonadism male 01/24/2012  . CHEST TIGHTNESS 02/18/2008    Expected Discharge Date: Expected Discharge Date: 12/29/18  Team Members Present: Physician leading  conference: Dr. Delice Lesch Social Worker Present: Lennart Pall, LCSW Nurse Present: Isla Pence, RN PT Present: Burnard Bunting, PT OT Present: Cherylynn Ridges, OT SLP Present: Weston Anna, SLP PPS Coordinator present : Gunnar Fusi     Current Status/Progress Goal Weekly Team Focus  Medical   Decreased functional mobility secondary to  Left femoral neck fracture. 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   Improve mobility, transfers, endurance  See above   Bowel/Bladder   Pt is continent B/B. LBM 12/25/2018.  Maintain regular bowel pattern.   Assist with toileting needs PRN.   Swallow/Nutrition/ Hydration             ADL's   CGA/Min A transfers, Min A BADLs  Supervision/min A overall  self-care retraining, sit<>stand, activity tolerance, dc planning, pt/family education   Mobility   CGA-min assist overall, gait w/ RW household distances, 8 steps w/ unilateral rail  supervision to min assist overall, gait household level  functional strength, sit<>stands from lower surface, discharge planning and family edu   Communication             Safety/Cognition/ Behavioral Observations            Pain   No complaints of pain.  Remain pain free.  Assess pain Q shift and PRN.   Skin   Pt has an unstageable pressure ulcer to the base of the penis.  Treat skin issues per orders.  Assess skin Q shift and PRN.    Rehab Goals Patient on target to meet rehab goals: Yes *See Care Plan and progress notes for long and  short-term goals.     Barriers to Discharge  Current Status/Progress Possible Resolutions Date Resolved   Physician    Medical stability;Other (comments)  Baseline Parkinsons  See above  Therapies, follow labs, cont Neuro meds      Nursing                  PT  Inaccessible home environment;Home environment access/layout  8 steps to access home, bedroom/bathroom on 2nd floor               OT                  SLP                SW                 Discharge Planning/Teaching Needs:  Plan for pt to return home with wife who can provide 24/7 assistance.  Teaching planned for tomorrow.   Team Discussion:  No new medical issues/ concerns.  Cont of urine and incision healing well.  Reaching goals of supervision to min assist and plans to do real car transfer tomorrow with wife.  Still needs extra time to complete tasks but doing well and team agrees can move d/c date earlier.  Revisions to Treatment Plan:  NA    Continued Need for Acute Rehabilitation Level of Care: The patient requires daily medical management by a physician with specialized training in physical medicine and rehabilitation for the following conditions: Daily direction of a multidisciplinary physical rehabilitation program to ensure safe treatment while eliciting the highest outcome that is of practical value to the patient.: Yes Daily medical management of patient stability for increased activity during participation in an intensive rehabilitation regime.: Yes Daily analysis of laboratory values and/or radiology reports with any subsequent need for medication adjustment of medical intervention for : Post surgical problems;Neurological problems   I attest that I was present, lead the team conference, and concur with the assessment and plan of the team.   Quita Mcgrory 12/26/2018, 4:35 PM

## 2018-12-26 NOTE — Progress Notes (Addendum)
Physical Therapy Session Note  Patient Details  Name: [name] [name] MRN: [id number] Date of Birth: 1951-08-10  Today's Date: 12/26/2018 PT Individual Time: 1100-1200 PT Individual Time Calculation (min): 60 min   Short Term Goals: Week 2:  PT Short Term Goal 1 (Week 2): Pt will initiate stair training PT Short Term Goal 2 (Week 2): Pt will maintain dynamic standing balance w/ min assist PT Short Term Goal 3 (Week 2): Pt will ambulate 150' w/ CGA w/ LRAD  Skilled Therapeutic Interventions/Progress Updates: Pt presented in w/c agreeable to therapy. Pt denies pain throughout session. Pt requesting to use bathroom prior to leaving room. Performed STS from w/c with CGA however significant posterior lean upon standing requiring assist from PTA for recovery. Pt was then able to ambulate to bathroom and was able to use urinal in standing with set up and LUE on wall. Pt returned to w/c with CGA and RW and PTA transported pt to day room. Pt participated in NuStep L5 x 11min for general conditioning and to "loosen up" pt. Pt then transported to stair well and participated in ascending/descending stairs L/R rail only. Pt ascended/descending stairs with R rail CGA with x 2 occurences of freezing lasting 10-20 sec. Pt varied between step to and step through pattern ascending however per observation appeared more fluid with step to pattern. Pt with more hesitancy ascending with L rail ,taking x 2 steps then longer episode of freezing. Pt was able to come back down x 2 steps "restart", ascended initially with x 2 rails but able to progress to 1 rail with CGA. PTA expressed importance to continue practicing with x 1 rail as this is what pt has at home with pt verbalizing understanding. Once pt back in hallway pt ambulated back to room with RW supervision level. Pt returned to w/c at end of session and left with call bell within reach and nsg present.      Therapy Documentation Precautions:   Precautions Precautions: Fall Precaution Comments: history of parkinson's Restrictions Weight Bearing Restrictions: No LLE Weight Bearing: Weight bearing as tolerated    Therapy/Group: Individual Therapy  Carrin Vannostrand  Davon Folta, PTA  12/26/2018, 1:22 PM

## 2018-12-27 ENCOUNTER — Inpatient Hospital Stay (HOSPITAL_COMMUNITY): Payer: Self-pay

## 2018-12-27 ENCOUNTER — Inpatient Hospital Stay (HOSPITAL_COMMUNITY): Payer: Self-pay | Admitting: Physical Therapy

## 2018-12-27 ENCOUNTER — Inpatient Hospital Stay (HOSPITAL_COMMUNITY): Payer: Self-pay | Admitting: Occupational Therapy

## 2018-12-27 MED ORDER — LINACLOTIDE 145 MCG PO CAPS
290.0000 ug | ORAL_CAPSULE | Freq: Every day | ORAL | Status: DC
  Administered 2018-12-27 – 2018-12-29 (×3): 290 ug via ORAL
  Filled 2018-12-27 (×3): qty 2

## 2018-12-27 NOTE — Progress Notes (Signed)
Occupational Therapy Session Note  Patient Details  Name: [name] [name] MRN: [id number] Date of Birth: [birth year]  Today's Date: 12/27/2018 OT Individual Time: 1000-1115 OT Individual Time Calculation (min): 75 min   Short Term Goals: Week 3:  OT Short Term Goal 1 (Week 3): Pt will complete LB dressing with min A using ADL AE OT Short Term Goal 2 (Week 3): Pt will complete 1/3 toileting steps OT Short Term Goal 3 (Week 3): Pt tolerate standing at the sink for 2 mins in preparation for BADL tasks  Skilled Therapeutic Interventions/Progress Updates:    Pt greeted seated in wc and agreeable to OT treatment session. Pt reports need to go to the bathroom. Urinal used in standing with OT providing set-up, then pt able to place urinal and maintain standing balance with supervision. Pt then ambulated to bathroom and transferred onto raised commode with CGA. Pt with small loose BM in commode. Worked on toileting strategies with pt able to completed peri-care with set-up A and supervision. Pt then ambulated to shower bench and doffed clothing using reacher and CGA. Bathing completed with increased time, LH sponge, and overall close supervision. Reviewed LB dressing strategies using reacher to don pants with verbal cues for technique.  Pt then practiced walk-in shower transfer in simulated home environment using bariatric 3-in-1 BSC. Pt needed CGA for transfer when stepping over ledge. Pt left seated in wc for next PT session coming right after OT.   Therapy Documentation Precautions:  Precautions Precautions: Fall Precaution Comments: history of parkinson's Restrictions Weight Bearing Restrictions: No LLE Weight Bearing: Weight bearing as tolerated Pain: Pain Assessment Pain Scale: 0-10 Pain Score: 0-No pain  Therapy/Group: Individual Therapy  [name] 12/27/2018, 10:17 AM

## 2018-12-27 NOTE — Progress Notes (Signed)
Occupational Therapy Session Note  Patient Details  Name: [name] [name] MRN: [id number] Date of Birth: Mar 09, 1951  Today's Date: 12/27/2018 OT Individual Time: 1315-1430 OT Individual Time Calculation (min): 75 min    Short Term Goals: Week 2:  OT Short Term Goal 1 (Week 2): Pt will complete toilet transfers with min assist RW to 3:1 OT Short Term Goal 1 - Progress (Week 2): Met OT Short Term Goal 2 (Week 2): Pt will complete LB dressing with mod assist sit to stand and AE PRN.  OT Short Term Goal 2 - Progress (Week 2): Met OT Short Term Goal 3 (Week 2): Pt will complete LB bathing with min assist sit to stand.   OT Short Term Goal 3 - Progress (Week 2): Met OT Short Term Goal 4 (Week 2): Pt will manage clothing with min assist sit to stand.   OT Short Term Goal 4 - Progress (Week 2): Met  Skilled Therapeutic Interventions/Progress Updates:    1:1. Pt received in w/c  With no c/o pain. Pt initially joking asking to do crossword puzzle wth OT. Pt propels w/c to tx gym with increased time. Pt stands to play game of cornhole 3 rounds reaching in mod-max ranges outside BOS. Pt completes standing game of quirkle with mod fading to min VC for following rules. Pt able to stand in 15 min increments. Pt reports need to toilet. Pt compeltes stand pivot transfer with CGA to low toilet and requies min"boost" to power up to standing after increased time to void bladder. Unsucessful bowel movement. Exited session withpt seated in w/c, call light in reach and all needs met  Therapy Documentation Precautions:  Precautions Precautions: Fall Precaution Comments: history of parkinson's Restrictions Weight Bearing Restrictions: No LLE Weight Bearing: Weight bearing as tolerated General:   Vital Signs:   Pain:   ADL: ADL Eating: Supervision/safety Where Assessed-Eating: Wheelchair Grooming: Supervision/safety Where Assessed-Grooming: Wheelchair Upper Body Bathing: Maximal assistance Where  Assessed-Upper Body Bathing: Wheelchair Lower Body Bathing: Dependent Where Assessed-Lower Body Bathing: Wheelchair Upper Body Dressing: Maximal assistance Where Assessed-Upper Body Dressing: Wheelchair Lower Body Dressing: Dependent Where Assessed-Lower Body Dressing: Wheelchair Toileting: Dependent Where Assessed-Toileting: Bedside Commode Toilet Transfer: Dependent(use of stedy) Toilet Transfer Method: Other (comment)(Stedy) Science writer: Art gallery manager    Praxis   Exercises:   Other Treatments:     Therapy/Group: Individual Therapy  Tonny Branch 12/27/2018, 2:33 PM

## 2018-12-27 NOTE — Plan of Care (Signed)
  Problem: RH BOWEL ELIMINATION Goal: RH STG MANAGE BOWEL WITH ASSISTANCE Description STG Manage Bowel with min Assistance.  Outcome: Progressing   Problem: RH BLADDER ELIMINATION Goal: RH STG MANAGE BLADDER WITH ASSISTANCE Description STG Manage Bladder With min Assistance  Outcome: Progressing   Problem: RH SKIN INTEGRITY Goal: RH STG SKIN FREE OF INFECTION/BREAKDOWN Outcome: Progressing Goal: RH STG MAINTAIN SKIN INTEGRITY WITH ASSISTANCE Description STG Maintain Skin Integrity With min Assistance.  Outcome: Progressing   Problem: RH SAFETY Goal: RH STG ADHERE TO SAFETY PRECAUTIONS W/ASSISTANCE/DEVICE Description STG Adhere to Safety Precautions With min Assistance/Device.  Outcome: Progressing   Problem: RH PAIN MANAGEMENT Goal: RH STG PAIN MANAGED AT OR BELOW PT'S PAIN GOAL Description Less than 3 out of 10  Outcome: Progressing   Problem: RH KNOWLEDGE DEFICIT GENERAL Goal: RH STG INCREASE KNOWLEDGE OF SELF CARE AFTER HOSPITALIZATION Outcome: Progressing   Problem: Consults Goal: RH GENERAL PATIENT EDUCATION Description See Patient Education module for education specifics. Outcome: Progressing

## 2018-12-27 NOTE — Discharge Instructions (Signed)
Inpatient Rehab Discharge Instructions  [name] [name] Discharge date and time: No discharge date for patient encounter.   Activities/Precautions/ Functional Status: Activity: weightbearing as tolerated Diet: regular diet Wound Care: keep wound clean and dry Functional status:  ___ No restrictions     ___ Walk up steps independently ___ 24/7 supervision/assistance   ___ Walk up steps with assistance ___ Intermittent supervision/assistance  ___ Bathe/dress independently ___ Walk with walker     _x__ Bathe/dress with assistance ___ Walk Independently    ___ Shower independently ___ Walk with assistance    ___ Shower with assistance ___ No alcohol     ___ Return to work/school ________    COMMUNITY REFERRALS UPON DISCHARGE:    Home Health:   PT      OT                         Agency:  [facility]        Phone:  [phone]   Medical Equipment/Items Ordered:  Wheelchair, walker, commode                                                        Agency/Supplier:  [facility] @ [phone]      Special Instructions: No driving   My questions have been answered and I understand these instructions. I will adhere to these goals and the provided educational materials after my discharge from the hospital.  Patient/Caregiver Signature _______________________________ Date __________  Clinician Signature _______________________________________ Date __________  Please bring this form and your medication list with you to all your follow-up doctor's appointments.

## 2018-12-27 NOTE — Progress Notes (Signed)
[facility] PHYSICAL MEDICINE & REHABILITATION PROGRESS NOTE  Subjective/Complaints: Patient seen sitting up in bed this morning.  He states he slept well overnight.  He is pleased with his discharge date being moved up.  ROS: Denies CP, SOB, N/V/D   Objective: Vital Signs: Blood pressure 125/73, pulse 72, temperature 98.7 F (37.1 C), temperature source Oral, resp. rate 16, weight (!) 140 kg, SpO2 95 %. No results found. No results for input(s): WBC, HGB, HCT, PLT in the last 72 hours. No results for input(s): NA, K, CL, CO2, GLUCOSE, BUN, CREATININE, CALCIUM in the last 72 hours.  Physical Exam: BP 125/73 (BP Location: Left Arm)   Pulse 72   Temp 98.7 F (37.1 C) (Oral)   Resp 16   Wt (!) 140 kg   SpO2 95%   BMI 39.63 kg/m  Constitutional: No distress . Vital signs reviewed. HENT: Normocephalic.  Atraumatic. Eyes: EOMI. No discharge. Cardiovascular: RRR.  No JVD. Respiratory: CTA bilaterally.  Normal effort. GI: BS +. Non-distended. Musc: No edema or tenderness in extremities. Musculoskeletal: Edema and tenderness in left hip, improving Neurological:Alert and oriented Mask facies, stable.  Dysarthria, stable.  Motor:  LLE: hip flexion, knee extension 4-/5, ankle dorsiflexion 4+/5, unchanged RLE: 4+/5 proximal to distal, unchanged Skin: left hip incision c/d/i Psychiatric: flat  Assessment/Plan: 1. Functional deficits secondary to left hip fracture with history of Parkinson's disease which require 3+ hours per day of interdisciplinary therapy in a comprehensive inpatient rehab setting.  Physiatrist is providing close team supervision and 24 hour management of active medical problems listed below.  Physiatrist and rehab team continue to assess barriers to discharge/monitor patient progress toward functional and medical goals  Care Tool:  Bathing    Body parts bathed by patient: Right arm, Left arm, Chest, Abdomen, Face, Front perineal area, Right upper leg, Left  upper leg   Body parts bathed by helper: Right lower leg, Left lower leg, Buttocks     Bathing assist Assist Level: Moderate Assistance - Patient 50 - 74%     Upper Body Dressing/Undressing Upper body dressing   What is the patient wearing?: Pull over shirt    Upper body assist Assist Level: Minimal Assistance - Patient > 75%    Lower Body Dressing/Undressing Lower body dressing      What is the patient wearing?: Pants, Incontinence brief     Lower body assist Assist for lower body dressing: Maximal Assistance - Patient 25 - 49%     Toileting Toileting Toileting Activity did not occur (Clothing management and hygiene only): Safety/medical concerns  Toileting assist Assist for toileting: Moderate Assistance - Patient 50 - 74%     Transfers Chair/bed transfer  Transfers assist  Chair/bed transfer activity did not occur: Safety/medical concerns  Chair/bed transfer assist level: Minimal Assistance - Patient > 75%     Locomotion Ambulation   Ambulation assist   Ambulation activity did not occur: Safety/medical concerns  Assist level: Contact Guard/Touching assist Assistive device: Walker-rolling Max distance: 100'   Walk 10 feet activity   Assist  Walk 10 feet activity did not occur: Safety/medical concerns  Assist level: Contact Guard/Touching assist Assistive device: Walker-rolling   Walk 50 feet activity   Assist Walk 50 feet with 2 turns activity did not occur: Safety/medical concerns  Assist level: Contact Guard/Touching assist Assistive device: Walker-rolling    Walk 150 feet activity   Assist Walk 150 feet activity did not occur: Safety/medical concerns  Assist level: Contact Guard/Touching assist Assistive device:  Walker-rolling    Walk 10 feet on uneven surface  activity   Assist Walk 10 feet on uneven surfaces activity did not occur: Safety/medical concerns         Wheelchair     Assist   Type of Wheelchair: Manual     Wheelchair assist level: Supervision/Verbal cueing Max wheelchair distance: 150'    Wheelchair 50 feet with 2 turns activity    Assist        Assist Level: Supervision/Verbal cueing   Wheelchair 150 feet activity     Assist     Assist Level: Supervision/Verbal cueing      Medical Problem List and Plan: 1.Decreased functional mobilitysecondary to Left femoral neck fracture. 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 Weightbearing as tolerated  Continue CIR 2. Antithrombotics: -DVT/anticoagulation:Subcutaneous Lovenox. vascular study negative for DVT -antiplatelet therapy: N/A 3. Pain Management:  Scheduled tylenol, local ice/ heat  Have stopped Oxy and Robaxin due to lethargy  Controlled on 3/26 4. Mood:Wellbutrin 150 mg daily and 300 mg daily at bedtime  Celexa 40 mg daily, decreased to 20 mg on 3/11 due with prolonged QT  Provide emotional support  Depressed, appreciate neuropsych eval  Stable  Antipsychotic agents: no antipsychotics given PD 5. Neuropsych: This patientisnot fully capable of making decisions on hisown behalf. 6. Skin/Wound Care:Routine skin checks 7. Fluids/Electrolytes/Nutrition:Routine in and out's   BMP within acceptable range on 3/23  P.o. intake improved  Advanced to regular texture thin liquids with supervision, now intermittent 8. Acute blood loss anemia.   Hemoglobin 10.3 on 3/23  Continue to monitor 9. Parkinson disease. Sinemet 25-100 1-2 tablets 3 times a day, Comtan 200 mg 3 times a day,Requip 1 mg 3 times a day  Stable 10. Chronic idiopathic constipation. Linzess290 mcg daily  -colace stopped per wife request 11. Overactive bladder. Toviaz 4 mg daily 12.  Hyperglycemia  Improved. No longer checking CBG's 13.  Leukocytosis: Resolved  WBCs 8.2 on 3/23  UA relatively unremarkable, urine culture NG  Ammonia WNL  Chest x-ray without acute changes  Afebrile 14.   Hypoalbuminemia  Supplement initiated on 3/11  15.  Labile blood pressure  Controlled on 3/26  LOS: 16 days A FACE TO FACE EVALUATION WAS PERFORMED  Lelani Garnett Lorie Phenix 12/27/2018, 10:16 AM

## 2018-12-27 NOTE — Progress Notes (Signed)
Physical Therapy Session Note  Patient Details  Name: [name] [name] MRN: [id number] Date of Birth: Feb 06, 1951  Today's Date: 12/27/2018 PT Individual Time: 0804-0830 AND 1115-1225 PT Individual Time Calculation (min): 26 min AND 70 min  Short Term Goals: Week 2:  PT Short Term Goal 1 (Week 2): Pt will initiate stair training PT Short Term Goal 2 (Week 2): Pt will maintain dynamic standing balance w/ min assist PT Short Term Goal 3 (Week 2): Pt will ambulate 150' w/ CGA w/ LRAD  Skilled Therapeutic Interventions/Progress Updates:   Session 1:  Pt in supine and agreeable to therapy, no c/o pain. Supervision bed mobility and supervision to don shirt, min assist to don shorts. Sit<>stand from bed surface w/ min assist to boost. Ambulated 150' in hallway to "loosen up" as he reports increased stiffness from laying in bed all night. Set-up assist to eat breakfast in w/c. Ended session in w/c and all needs in reach. Increased time for all mobility 2/2 stiffness and occasional Parkinsonian freezing of movement.   Session 2:  Pt in w/c and agreeable to therapy, no c/o pain. Total assist w/c transport to/from outside of hospital to practice real car transfer. Performed transfer w/ primarily supervision assist, needed manual assist to lift LLE into car and verbal cues for safety/technique. Wife present to observe and provided extensive education to wife while she was present. Discussed importance of close supervision for transfers and gait, and CGA while on stairs. Pt ambulated >200', including uneven surfaces w/ wife providing appropriate cues for safety and RW management. Verbally educated and discussed step-to pattern for stairs, proper guarding techniques, and use of gait belt. Per wife, pt's son and male family friend will be present at d/c day and she plans to have them assist on first attempt at carpeted stairs to 2nd floor for safety. Discussed importance of not sitting on low surfaces to  maintain anterior THA precautions and importance of requiring bilateral armrests for all surfaces that pt will be standing from. Pt sleeps in recliner that elevates to standing surface. Lastly, educated on pt's Parkinsonian "freezing" of movement and importance of taking his time and allowing him to take his time to avoid increased stress and fall risk w/ functional mobility. Wife and pt verbalized understanding of all education and in agreement. Returned to unit and negotiated 8 steps w/ L rail only and 8 steps w/ R rail only, CGA and much improved confidence than last session with this therapist - no freezing. Returned to room and ended session in w/c, all needs in reach.  Therapy Documentation Precautions:  Precautions Precautions: Fall Precaution Comments: history of parkinson's Restrictions Weight Bearing Restrictions: No LLE Weight Bearing: Weight bearing as tolerated Vital Signs: Therapy Vitals Temp: 98.7 F (37.1 C) Temp Source: Oral Pulse Rate: 72 Resp: 16 BP: 125/73 Patient Position (if appropriate): Lying Oxygen Therapy SpO2: 95 % O2 Device: Room Air   Therapy/Group: Individual Therapy  Tammie Yanda Clent Demark 12/27/2018, 8:31 AM

## 2018-12-27 NOTE — Progress Notes (Signed)
Social Work Patient ID: [name] [name], male   DOB: Apr 11, 1951, 68 y.o.   MRN: [id number]  Have reviewed team conference with pt and wife who are both very pleased with progress and earlier d/c date of 3/28.  Wife to be here this morning to complete family ed (outside) with real car transfers.  Discussed DME and [facility] referrals made.  No further questions at this time.  Davione Lenker, LCSW

## 2018-12-28 ENCOUNTER — Inpatient Hospital Stay (HOSPITAL_COMMUNITY): Payer: Self-pay | Admitting: Occupational Therapy

## 2018-12-28 ENCOUNTER — Inpatient Hospital Stay (HOSPITAL_COMMUNITY): Payer: Self-pay | Admitting: Physical Therapy

## 2018-12-28 MED ORDER — LINACLOTIDE 290 MCG PO CAPS: ORAL_CAPSULE | ORAL | 5 refills | Status: DC

## 2018-12-28 MED ORDER — FESOTERODINE FUMARATE ER 4 MG PO TB24: 4.0000 mg | ORAL_TABLET | Freq: Every day | ORAL | 0 refills | Status: DC

## 2018-12-28 MED ORDER — CITALOPRAM HYDROBROMIDE 20 MG PO TABS: 20.0000 mg | ORAL_TABLET | Freq: Every day | ORAL | 0 refills | Status: DC

## 2018-12-28 NOTE — Progress Notes (Signed)
Occupational Therapy Session Note  Patient Details  Name: [name] [name] MRN: [id number] Date of Birth: [birth year]  Today's Date: 12/28/2018 OT Individual Time: 1000-1110 OT Individual Time Calculation (min): 70 min   Short Term Goals: Week 3:  OT Short Term Goal 1 (Week 3): Pt will complete LB dressing with min A using ADL AE OT Short Term Goal 2 (Week 3): Pt will complete 1/3 toileting steps OT Short Term Goal 3 (Week 3): Pt tolerate standing at the sink for 2 mins in preparation for BADL tasks  Skilled Therapeutic Interventions/Progress Updates:    Pt greeted sitting in wc and agreeable to OT treatment session. Pt reported need to brush his teeth. Worked on functional ambulation and standing balance/endurance with standing grooming task. Supervision for ambulation and dynamic balance. Pt then ambulated to therapy gym without rest break, RW and supervision. Discussed dc plan and home modifications for safe BADL participation. Worked on B UE strengthening with seated chest press ball toss. Pt then reported need to go to the bathroom and ambulated back to room in similar fashion. Pt transitioned onto commode w/ supervision. Pt able to manage clothing and complete peri-care after urinating and attemting BM with supervision. Practiced donning/doffing shoe with use of reacher to fasten velcro straps. Pt left seated in wc at end of session with safety belt on and needs met awaiting next therapy session.   Therapy Documentation Precautions:  Precautions Precautions: Fall Precaution Comments: history of parkinson's Restrictions Weight Bearing Restrictions: Yes LLE Weight Bearing: Weight bearing as tolerated   Therapy/Group: Individual Therapy  [name] 12/28/2018, 11:15 AM

## 2018-12-28 NOTE — Discharge Summary (Signed)
Physician Discharge Summary  Patient ID: [name] [name] MRN: [id number] DOB/AGE: 1951/01/08 68 y.o.  Admit date: 12/11/2018 Discharge date: 12/29/2018  Discharge Diagnoses:  Active Problems:   Parkinson's disease (tremor, stiffness, slow motion, unstable posture) (HCC)   Left displaced femoral neck fracture (HCC)   Acute blood loss anemia   Leukocytosis   Episode of recurrent major depressive disorder (HCC)   Hypoalbuminemia due to protein-calorie malnutrition (HCC)   AMS (altered mental status)   Labile blood pressure   Dysphagia   Postoperative pain chronic idiopathic constipation Overactive bladder  Discharged Condition: stable  Significant Diagnostic Studies: Dg Chest 2 View  Result Date: 12/14/2018 CLINICAL DATA:  Altered mental status EXAM: CHEST - 2 VIEW COMPARISON:  12/13/2018 FINDINGS: Poor inspiration. Heart size upper limits of normal. Mild volume loss at the left base, probably secondary to the poor inspiration. No consolidation or lobar collapse. No visible effusion. IMPRESSION: Poor inspiration. Mild volume loss left base, probably secondary to the poor inspiration. Electronically Signed   By: Nelson Chimes M.D.   On: 12/14/2018 10:28   Dg Chest 2 View  Result Date: 12/13/2018 CLINICAL DATA:  Altered mental status. EXAM: CHEST - 2 VIEW COMPARISON:  None. FINDINGS: The heart size is exaggerated by low lung volumes. Asymmetric left basilar airspace disease is present. No edema or effusion is present. No other focal airspace disease is evident. IMPRESSION: 1. Asymmetric left basilar airspace disease likely reflects atelectasis. Infection or aspiration is not excluded. Electronically Signed   By: San Morelle M.D.   On: 12/13/2018 16:02   Pelvis Portable  Result Date: 12/09/2018 CLINICAL DATA:  Status post left hip replacement EXAM: PORTABLE PELVIS 1 VIEWS COMPARISON:  None. FINDINGS: Left hip prosthesis is noted in satisfactory position. No acute soft tissue or  bony abnormality is noted. IMPRESSION: Status post left hip replacement Electronically Signed   By: Inez Catalina M.D.   On: 12/09/2018 12:00   Dg Chest Port 1 View  Result Date: 12/08/2018 CLINICAL DATA:  Preop hip fracture. EXAM: PORTABLE CHEST 1 VIEW COMPARISON:  10/02/2013 FINDINGS: Mild cardiomegaly and vascular congestion. Low lung volumes. Left base atelectasis. No confluent opacity on the right. No overt edema, effusions or acute bony abnormality. IMPRESSION: Low lung volumes with mild cardiomegaly, vascular congestion and left base atelectasis. Electronically Signed   By: Rolm Baptise M.D.   On: 12/08/2018 18:50   Dg C-arm 1-60 Min  Result Date: 12/09/2018 CLINICAL DATA:  Left femoral neck fracture EXAM: OPERATIVE LEFT HIP WITH PELVIS; DG C-ARM 61-120 MIN COMPARISON:  None. FLUOROSCOPY TIME:  Fluoroscopy Time:  38 seconds Radiation Exposure Index (if provided by the fluoroscopic device): Not available Number of Acquired Spot Images: 6 FINDINGS: Initial images again demonstrate the femoral neck fracture. Subsequent removal of the femoral head with placement of the acetabular component is seen. Left femoral portion of the prosthesis is then placed in satisfactory position. IMPRESSION: Status post left hip replacement. Electronically Signed   By: Inez Catalina M.D.   On: 12/09/2018 12:00   Dg Hip Operative Unilat W Or W/o Pelvis Left  Result Date: 12/09/2018 CLINICAL DATA:  Left femoral neck fracture EXAM: OPERATIVE LEFT HIP WITH PELVIS; DG C-ARM 61-120 MIN COMPARISON:  None. FLUOROSCOPY TIME:  Fluoroscopy Time:  38 seconds Radiation Exposure Index (if provided by the fluoroscopic device): Not available Number of Acquired Spot Images: 6 FINDINGS: Initial images again demonstrate the femoral neck fracture. Subsequent removal of the femoral head with placement of the acetabular component  is seen. Left femoral portion of the prosthesis is then placed in satisfactory position. IMPRESSION: Status post left  hip replacement. Electronically Signed   By: Inez Catalina M.D.   On: 12/09/2018 12:00   Dg Hip Unilat W Or Wo Pelvis 2-3 Views Left  Result Date: 12/08/2018 CLINICAL DATA:  Left leg shortening. EXAM: DG HIP (WITH OR WITHOUT PELVIS) 2-3V LEFT COMPARISON:  None. FINDINGS: Fracture of the left femoral neck. Pelvic bony ring is intact. Right hip is intact. Superior displacement of the left femoral trochanters secondary to the fracture. Left femoral head appears to be located on the cross-table lateral view. Degenerative changes in the lower lumbar spine. IMPRESSION: Fracture of the left femoral neck. Electronically Signed   By: Markus Daft M.D.   On: 12/08/2018 16:30   Dg Femur Min 2 Views Left  Result Date: 12/08/2018 CLINICAL DATA:  Fall with left hip injury EXAM: LEFT FEMUR 2 VIEWS COMPARISON:  None. FINDINGS: Impacted left femoral neck fracture. No additional fracture in left femoral shaft. No evidence of dislocation at the left hip or left knee on these views. No focal osseous lesions. No radiopaque foreign body. IMPRESSION: Impacted left femoral neck fracture, please see the separate concurrent dedicated left hip radiograph report for further details. Electronically Signed   By: Ilona Sorrel M.D.   On: 12/08/2018 16:29   Vas [place] Lower Extremity Venous (dvt)  Result Date: 12/12/2018  Lower Venous Study Indications: Swelling.  Limitations: Body habitus and patient positioning. Performing Technologist: Abram Sander RVS  Examination Guidelines: A complete evaluation includes B-mode imaging, spectral Doppler, color Doppler, and power Doppler as needed of all accessible portions of each vessel. Bilateral testing is considered an integral part of a complete examination. Limited examinations for reoccurring indications may be performed as noted.  Right Venous Findings: +---------+---------------+---------+-----------+----------+------------------+          CompressibilityPhasicitySpontaneityPropertiesSummary             +---------+---------------+---------+-----------+----------+------------------+ CFV      Full           Yes      Yes                                     +---------+---------------+---------+-----------+----------+------------------+ SFJ      Full                                                            +---------+---------------+---------+-----------+----------+------------------+ FV Prox  Full                                                            +---------+---------------+---------+-----------+----------+------------------+ FV Mid   Full                                                            +---------+---------------+---------+-----------+----------+------------------+ FV DistalFull                                                            +---------+---------------+---------+-----------+----------+------------------+  PFV      Full                                                            +---------+---------------+---------+-----------+----------+------------------+ POP                     Yes      Yes                  limited                                                                  visualization      +---------+---------------+---------+-----------+----------+------------------+ PTV      Full                                                            +---------+---------------+---------+-----------+----------+------------------+ PERO     Full                                                            +---------+---------------+---------+-----------+----------+------------------+  Left Venous Findings: +---------+---------------+---------+-----------+----------+------------------+          CompressibilityPhasicitySpontaneityPropertiesSummary            +---------+---------------+---------+-----------+----------+------------------+ CFV      Full           Yes      Yes                                      +---------+---------------+---------+-----------+----------+------------------+ SFJ      Full                                                            +---------+---------------+---------+-----------+----------+------------------+ FV Prox  Full                                                            +---------+---------------+---------+-----------+----------+------------------+ FV Mid   Full                                                            +---------+---------------+---------+-----------+----------+------------------+  FV DistalFull                                                            +---------+---------------+---------+-----------+----------+------------------+ PFV      Full                                                            +---------+---------------+---------+-----------+----------+------------------+ POP      Full           Yes      Yes                  limited                                                                  visualization      +---------+---------------+---------+-----------+----------+------------------+ PTV      Full                                                            +---------+---------------+---------+-----------+----------+------------------+ PERO                                                  Not visualized     +---------+---------------+---------+-----------+----------+------------------+    Summary: Right: There is no evidence of deep vein thrombosis in the lower extremity. However, portions of this examination were limited- see technologist comments above. No cystic structure found in the popliteal fossa. Left: There is no evidence of deep vein thrombosis in the lower extremity. However, portions of this examination were limited- see technologist comments above. No cystic structure found in the popliteal fossa.  *See table(s) above for measurements and observations. Electronically  signed by Harold Barban MD on 12/12/2018 at 6:49:32 PM.    Final     Labs:  Basic Metabolic Panel: Recent Labs  Lab 12/24/18 1003  NA 136  K 3.7  CL 103  CO2 23  GLUCOSE 136*  BUN 14  CREATININE 1.09  CALCIUM 9.0    CBC: Recent Labs  Lab 12/24/18 1003  WBC 8.2  NEUTROABS 6.2  HGB 10.3*  HCT 32.7*  MCV 93.2  PLT 421*    CBG: No results for input(s): GLUCAP in the last 168 hours.  Family history. Father with lung cancer, daughter with history of seizures. Negative history for colon cancer, rectal cancer or diabetes  Brief HPI:    [name] [name] is a 68 year old right-handed male with history of Parkinson's disease followed by neurology services Dr. [name] Tat and maintained on Sinemet as well as Comtan . Per chart review lives  with supportive wife. Modified independent prior to admission. Two-level home. Wife does assist with some ADLs. Presented 12/08/2018 after a fall while walking down into his garage. He stated he missed the last step. He did not strike his head. Significant left hip pain. X-rays revealed left femoral neck fracture. Underwent total hip replacement on cemented 12/09/2018 per Dr.Xu with anterior approach. Hospital course pain management. Weightbearing as tolerated. Acute blood loss anemia 9.3. Subcutaneous Lovenox for DVT prophylaxis. Therapy evaluations completed and patient was admitted for a comprehensive rehabilitation program.   Hospital Course: ZACARIAS KRAUTER was admitted to rehab 12/11/2018 for inpatient therapies to consist of PT, ST and OT at least three hours five days a week. Past admission physiatrist, therapy team and rehab RN have worked together to provide customized collaborative inpatient rehab. Pertaining to Mr.Levitz left femoral neck fracture he underwent left total hip replacement uncemented anterior approach 12/09/2018. Neurovascular sensation intact he would follow up with orthopedic services. Patient would remain weightbearing as  tolerated. Subcutaneous Lovenox for DVT prophylaxis venous Doppler studies negative.pain management with the use of Tylenol only. Oxycodone and Robaxin discontinued due to lethargy. Mood stabilization with Wellbutrin and Celexa as prior to admission. Patient was improving with the use of emotional support and follow-up per neuropsychology. Acute blood loss anemia 10.3 and stable. Patient did have a history of Parkinson's disease followed by outpatient neurology services and would continue on Sinemet as well as Comtan and Requip. Chronic idiopathic constipation remained on Linzess.   Rehab course: During patient's stay in rehab weekly team conferences were held to monitor patient's progress, set goals and discuss barriers to discharge. At admission, patient required minimal guard sit to stand, total assist supine to sit +2 for physical assistance. Required total assist for bed mobility and max assist for lower extremity management. Max total assist ADLs.  Physical exam. Blood pressure 123/74 pulse 82 temperature 90.8 respirations 18 oxygen saturation is 93% room air Constitutional. Well-developed no distress HEENT Head. Normocephalic and atraumatic Eyes. Pupils round and reactive to light no discharge without nystagmus EOMs normal Neck. Normal range of motion or tracheal deviation present no thyromegaly without bruit Cardiovascular. Normal rate no friction rub or murmur heard. Respiratory. Effort normal good breath sounds no respiratory distress without wheezes GI. Soft exhibits no distention With some diminished bowel sounds Musculoskeletal Gen. Edema +1. Left hip tender Neurological Patient alert distress made eye contact mood was flat and blunted masked facies. Cogwheeling rigidity in all 4 limbs. Speech was a bit dysarthric. Upper extremities 4+ out of 5 bilateral. Left lower extremity limited by pain. Right lower extremity 3-4 out of 5 proximal to distal.   He  has had improvement in activity  tolerance, balance, postural control as well as ability to compensate for deficits. He/She has had improvement in functional use RUE/LUE  and RLE/LLE as well as improvement in awareness patient with excellent overall progress. Supervision bed mobility and supervision to don his shirt minimal assist to don his shorts. Sit to stand from bed surface with minimal assist to boost. Angulates 150 feet in the hallway with assistive device. Setup assist to eat breakfast. Working with energy conservation techniques. Perform transfers with primarily supervision. He was able to ambulate up to 200 feet at a time. Patient can ambulate to the bathroom and transferred onto raised commode with contact-guard assist. Ambulates to the shower bench and doff clothing using reacher and contact-guard. Bathing completed with increased time. Family teaching completed and plan discharge to home  Disposition: discharge to home   Diet:regular  Special Instructions: Weightbearing as tolerated  Medications at discharge 1. Tylenol as needed 2.Wellbutrin 300 mg daily, Sinemet 25-100 milligrams as scheduled prior to admission 3. Celexa 20 mg daily 4.Comtan 200 mg 3 times a day 5.Toviaz 4 mg daily 6.Linzess 290 g daily before breakfast 7. MiraLAX daily as needed 8. Requip 1 mg 3 times a day  Discharge Instructions    Ambulatory referral to Physical Medicine Rehab   Complete by:  As directed    Moderate complexity follow-up 1-2 weeks left femoral neck fracture with a history of Parkinson's disease      Follow-up Information    Jamse Arn, MD Follow up.   Specialty:  Physical Medicine and Rehabilitation Why:  office to call for appointment Contact information: [place] [address] [phone]        TatEustace Quail, DO Follow up.   Specialty:  Neurology Why:  call for appointment Contact information: [place]  [id number] [phone]        Leandrew Koyanagi, MD Follow up.   Specialty:  Orthopedic Surgery Why:  call for appointment 2 weeks Contact information: [place]  [phone]           Signed: Cathlyn Parsons 12/28/2018, 5:29 AM

## 2018-12-28 NOTE — Progress Notes (Signed)
Occupational Therapy Discharge Summary  Patient Details  Name: [name] [name] MRN: [id number] Date of Birth: 1951-04-04  Patient has met 68 of 10 long term goals due to improved activity tolerance, improved balance, postural control, ability to compensate for deficits, functional use of  LEFT lower extremity, improved attention, improved awareness and improved coordination.  Patient to discharge at overall Supervision level.  Patient's care partner is independent to provide the necessary physical and cognitive assistance at discharge.    Reasons goals not met: n/a  Recommendation:  Patient will benefit from ongoing skilled OT services in home health setting to continue to advance functional skills in the area of BADL and Reduce care partner burden.  Equipment: bariatric BSC, RW, wheelchair  Reasons for discharge: treatment goals met and discharge from hospital  Patient/family agrees with progress made and goals achieved: Yes  OT Discharge Precautions/Restrictions  Precautions Precautions: Fall Precaution Comments: history of parkinson's Restrictions Weight Bearing Restrictions: No LLE Weight Bearing: Weight bearing as tolerated Pain  none/denies pain ADL ADL Eating: Supervision/safety Where Assessed-Eating: Wheelchair Grooming: Supervision/safety Where Assessed-Grooming: Wheelchair Upper Body Bathing: Supervision/safety Where Assessed-Upper Body Bathing: Wheelchair Lower Body Bathing: Dependent, Supervision/safety Where Assessed-Lower Body Bathing: Wheelchair Upper Body Dressing: Supervision/safety Where Assessed-Upper Body Dressing: Wheelchair Lower Body Dressing: Contact guard Where Assessed-Lower Body Dressing: Wheelchair Toileting: Contact guard Where Assessed-Toileting: Glass blower/designer: Close supervision(use of stedy) Armed forces technical officer Method: Counselling psychologist: Grab bars, Raised toilet seat Social research officer, government: Agricultural engineer Method: Herbalist: Not tested Praxis Praxis: Intact Cognition Overall Cognitive Status: History of cognitive impairments - at baseline Arousal/Alertness: Awake/alert Orientation Level: Oriented X4 Focused Attention: Appears intact Sustained Attention: Appears intact Memory: Appears intact Sensation Sensation Light Touch: Appears Intact Hot/Cold: Appears Intact Proprioception: Appears Intact Stereognosis: Appears Intact Coordination Gross Motor Movements are Fluid and Coordinated: No Fine Motor Movements are Fluid and Coordinated: No Motor  Motor Motor: Abnormal postural alignment and control Motor - Discharge Observations: Pt will slow movements, occasional shuffling/freezing of gait, and tremors 2/2 history of Parkinson's Mobility  Transfers Sit to Stand: Supervision/Verbal cueing Stand to Sit: Supervision/Verbal cueing  Trunk/Postural Assessment  Cervical Assessment Cervical Assessment: Exceptions to WFL(forward head posture) Thoracic Assessment Thoracic Assessment: Exceptions to WFL(rounded shoulders) Lumbar Assessment Lumbar Assessment: Exceptions to WFL(posterior pelvic tilt)  Balance Balance Balance Assessed: Yes Static Sitting Balance Static Sitting - Balance Support: Feet supported Static Sitting - Level of Assistance: 5: Stand by assistance Dynamic Sitting Balance Dynamic Sitting - Balance Support: During functional activity;Feet supported Dynamic Sitting - Level of Assistance: 5: Stand by assistance Static Standing Balance Static Standing - Balance Support: During functional activity Static Standing - Level of Assistance: 5: Stand by assistance Dynamic Standing Balance Dynamic Standing - Balance Support: During functional activity;No upper extremity supported Dynamic Standing - Level of Assistance: 5: Stand by assistance Extremity/Trunk Assessment RUE Assessment RUE Assessment: Exceptions to  [facility] General Strength Comments: AROM WFLs for all joints with strength 5/5 throughout.  Noted bilateral tremors in hands which spouse reports was not present PTA.. LUE Assessment LUE Assessment: Exceptions to [facility] General Strength Comments: AROM WFLs for all joints with strength 5/5 throughout.  Noted bilateral tremors in hands which spouse reports was not present PTA.   [name] [name] Kidney 12/28/2018, 3:20 PM

## 2018-12-28 NOTE — Progress Notes (Signed)
Subjective:    Patient reports pain as mild.  Patient doing well and progressing nicely with PT.  Will likely d/c home with wife tomorrow.    Objective: Vital signs in last 24 hours: Temp:  [97.7 F (36.5 C)-99 F (37.2 C)] 99 F (37.2 C) (03/27 0644) Pulse Rate:  [76-80] 78 (03/27 0644) Resp:  [18-20] 20 (03/26 2255) BP: (118-142)/(66-76) 142/76 (03/27 0644) SpO2:  [95 %-100 %] 95 % (03/27 0644)  Intake/Output from previous day: 03/26 0701 - 03/27 0700 In: 600 [P.O.:600] Out: 300 [Urine:300] Intake/Output this shift: Total I/O In: -  Out: 200 [Urine:200]  No results for input(s): HGB in the last 72 hours. No results for input(s): WBC, RBC, HCT, PLT in the last 72 hours. No results for input(s): NA, K, CL, CO2, BUN, CREATININE, GLUCOSE, CALCIUM in the last 72 hours. No results for input(s): LABPT, INR in the last 72 hours.  Neurovascular intact Sensation intact distally Intact pulses distally Dorsiflexion/Plantar flexion intact Incision: dressing C/D/I No cellulitis present Compartment soft  Well healing incision: small seroma which is not tense.  No tenderness and no drainage.  Very mild peri-incisional erythema.     Assessment/Plan:    Up with therapy  WBAT LLE Sutures have been removed Please reapply steri strips to the wound Apply ice to seroma as much as possible Continue ASA 81mg  bid for 4 weeks Follow-up with Dr. Erlinda Hong in 4 weeks      Aundra Dubin 12/28/2018, 8:59 AM

## 2018-12-28 NOTE — Progress Notes (Signed)
Occupational Therapy Session Note  Patient Details  Name: [name] [name] MRN: [id number] Date of Birth: 26-Nov-1950  Today's Date: 12/28/2018 OT Individual Time: 1351-1419 OT Individual Time Calculation (min): 28 min    Short Term Goals: Week 3:  OT Short Term Goal 1 (Week 3): Pt will complete LB dressing with min A using ADL AE OT Short Term Goal 2 (Week 3): Pt will complete 1/3 toileting steps OT Short Term Goal 3 (Week 3): Pt tolerate standing at the sink for 2 mins in preparation for BADL tasks  Skilled Therapeutic Interventions/Progress Updates:   Pt completed transfer stand pivot from the wheelchair to the therapy mat with min guard assist using the RW for support.  He was able to complete sit to stand from the EOM with min guard assist and then work on standing balance at the same level while reaching down to the mat to pick up cards and place them on a vertical board.  He needed increased time to position his feet when rotating to both the right and the left for reaching across his body.  Min guard assist needed for balance with all reaching tasks.  Finished session with ambulation back to the room with min guard assist using the RW for support.  He transferred to the EOB once he was in the room.  Before laying down, he decided to complete a toilet transfer before laying down.  Pt completed sit to stand from the bed with min guard assist and then ambulated to the bathroom with NT assisting the rest of the way.     Therapy Documentation Precautions:  Precautions Precautions: Fall Precaution Comments: history of parkinson's Restrictions Weight Bearing Restrictions: No LLE Weight Bearing: Weight bearing as tolerated  Pain: Pain Assessment Pain Scale: Faces Pain Score: 0-No pain    Therapy/Group: Individual Therapy  Iseah Plouff OTR/L 12/28/2018, 4:06 PM

## 2018-12-28 NOTE — Progress Notes (Signed)
[facility] PHYSICAL MEDICINE & REHABILITATION PROGRESS NOTE  Subjective/Complaints: Patient seen sitting up in bed this morning.  He states he slept well overnight.  He states he is looking forward to discharge tomorrow.  ROS: Denies CP, SOB, N/V/D   Objective: Vital Signs: Blood pressure (!) 142/76, pulse 78, temperature 99 F (37.2 C), temperature source Oral, resp. rate 20, weight (!) 140 kg, SpO2 95 %. No results found. No results for input(s): WBC, HGB, HCT, PLT in the last 72 hours. No results for input(s): NA, K, CL, CO2, GLUCOSE, BUN, CREATININE, CALCIUM in the last 72 hours.  Physical Exam: BP (!) 142/76   Pulse 78   Temp 99 F (37.2 C) (Oral)   Resp 20   Wt (!) 140 kg   SpO2 95%   BMI 39.63 kg/m  Constitutional: No distress . Vital signs reviewed. HENT: Normocephalic.  Atraumatic. Eyes: EOMI. No discharge. Cardiovascular: RRR.  No JVD. Respiratory: CTA bilaterally. Normal effort. GI: BS +. Non-distended. Musc: No edema or tenderness in extremities. Musculoskeletal: Edema and tenderness in left hip, unchanged Neurological:Alert and oriented Mask facies, stable.  Dysarthria, unchanged.  Motor:  LLE: hip flexion, knee extension 4/5, ankle dorsiflexion 4+/5 RLE: 4+/5 proximal to distal, unchanged Skin: left hip incision c/d/i with resolving hematoma Psychiatric: flat  Assessment/Plan: 1. Functional deficits secondary to left hip fracture with history of Parkinson's disease which require 3+ hours per day of interdisciplinary therapy in a comprehensive inpatient rehab setting.  Physiatrist is providing close team supervision and 24 hour management of active medical problems listed below.  Physiatrist and rehab team continue to assess barriers to discharge/monitor patient progress toward functional and medical goals  Care Tool:  Bathing    Body parts bathed by patient: Right arm, Left arm, Chest, Abdomen, Face, Front perineal area, Right upper leg, Left upper  leg   Body parts bathed by helper: Right lower leg, Left lower leg, Buttocks     Bathing assist Assist Level: Moderate Assistance - Patient 50 - 74%     Upper Body Dressing/Undressing Upper body dressing   What is the patient wearing?: Pull over shirt    Upper body assist Assist Level: Minimal Assistance - Patient > 75%    Lower Body Dressing/Undressing Lower body dressing      What is the patient wearing?: Pants, Incontinence brief     Lower body assist Assist for lower body dressing: Maximal Assistance - Patient 25 - 49%     Toileting Toileting Toileting Activity did not occur (Clothing management and hygiene only): Safety/medical concerns  Toileting assist Assist for toileting: Moderate Assistance - Patient 50 - 74%     Transfers Chair/bed transfer  Transfers assist  Chair/bed transfer activity did not occur: Safety/medical concerns  Chair/bed transfer assist level: Supervision/Verbal cueing     Locomotion Ambulation   Ambulation assist   Ambulation activity did not occur: Safety/medical concerns  Assist level: Contact Guard/Touching assist Assistive device: Walker-rolling Max distance: 200'   Walk 10 feet activity   Assist  Walk 10 feet activity did not occur: Safety/medical concerns  Assist level: Contact Guard/Touching assist Assistive device: Walker-rolling   Walk 50 feet activity   Assist Walk 50 feet with 2 turns activity did not occur: Safety/medical concerns  Assist level: Contact Guard/Touching assist Assistive device: Walker-rolling    Walk 150 feet activity   Assist Walk 150 feet activity did not occur: Safety/medical concerns  Assist level: Contact Guard/Touching assist Assistive device: Walker-rolling    Walk 10  feet on uneven surface  activity   Assist Walk 10 feet on uneven surfaces activity did not occur: Safety/medical concerns   Assist level: Contact Guard/Touching assist Assistive device: Horticulturist, commercial   Type of Wheelchair: Manual    Wheelchair assist level: Supervision/Verbal cueing Max wheelchair distance: 150'    Wheelchair 50 feet with 2 turns activity    Assist        Assist Level: Supervision/Verbal cueing   Wheelchair 150 feet activity     Assist     Assist Level: Supervision/Verbal cueing      Medical Problem List and Plan: 1.Decreased functional mobilitysecondary to Left femoral neck fracture. 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 Weightbearing as tolerated  Continue CIR  Plan for d/c tomorrow  Will see patient for transitional care management in 1-2 weeks post-discharge 2. Antithrombotics: -DVT/anticoagulation:Subcutaneous Lovenox. vascular study negative for DVT -antiplatelet therapy: N/A 3. Pain Management:  Scheduled tylenol, local ice/ heat  Have stopped Oxy and Robaxin due to lethargy  Controlled on 3/27 4. Mood:Wellbutrin 150 mg daily and 300 mg daily at bedtime  Celexa 40 mg daily, decreased to 20 mg on 3/11 due with prolonged QT  Provide emotional support  Depressed, appreciate neuropsych eval  Stable  Antipsychotic agents: no antipsychotics given PD 5. Neuropsych: This patientisnot fully capable of making decisions on hisown behalf. 6. Skin/Wound Care:Routine skin checks 7. Fluids/Electrolytes/Nutrition:Routine in and out's   BMP within acceptable range on 3/23  P.o. intake improved  Advanced to regular texture thin liquids with supervision, now intermittent 8. Acute blood loss anemia.   Hemoglobin 10.3 on 3/23  Continue to monitor 9. Parkinson disease. Sinemet 25-100 1-2 tablets 3 times a day, Comtan 200 mg 3 times a day,Requip 1 mg 3 times a day  Stable on 3/27 10. Chronic idiopathic constipation. Linzess290 mcg daily  -colace stopped per wife request 11. Overactive bladder. Toviaz 4 mg daily  Stable on 3/27 12.   Hyperglycemia  Improved. No longer checking CBG's 13.  Leukocytosis: Resolved  WBCs 8.2 on 3/23  UA relatively unremarkable, urine culture NG  Ammonia WNL  Chest x-ray without acute changes  Afebrile 14.  Hypoalbuminemia  Supplement initiated on 3/11  15.  Labile blood pressure  Controlled on 3/27  LOS: 17 days A FACE TO FACE EVALUATION WAS PERFORMED  Rama Mcclintock Lorie Phenix 12/28/2018, 8:40 AM

## 2018-12-28 NOTE — Progress Notes (Signed)
Physical Therapy Discharge Summary  Patient Details  Name: [name] [name] MRN: [id number] Date of Birth: 10-31-1950  Today's Date: 12/28/2018 PT Individual Time: 0820-0900 AND 8182-9937 PT Individual Time Calculation (min): 40 min AND 60 min  Session 1:  Pt in w/c and agreeable to therapy, denies pain. Missed 20 min of skilled PT at beginning of session 2/2 eating breakfast. Assisted w/ donning shorts prior to standing w/ RW, ambulated to/from therapy gym w/ supervision using RW. Session focused on HEP and THA education. Instructed on seated HEP including L heel slides and isometric adduction and abduction. Pt demo-ed correctly and w/o pain. Educated on anterior hip precautions and talked through handout given to him and for him to take home and show his wife. Pt verbalized understanding and in agreement w/ all education. Returned to room and ended session in w/c, all needs in reach.  Session 2:  Pt in w/c and agreeable to therapy, no c/o pain. Pt self-propelled w/c to therapy gym w/ supervision using BUEs. Worked on stair training, negotiated 12 steps w/ unilateral rail and CGA. Pt c/o increased lightheadedness at top of stairs, returned to bottom safely and BP taken in seated, 125/69. Provided pt w/ few minutes of rest and water, BP 112/65 in standing and pt endorsed increased lightheadedness in stance. Educated pt on postural hypotension in Parkinson's population and importance of staying hydrated. Instructed on standing exercises w/ counter top support including side stepping, L knee march, sit<>stands practice, and mini-squats. Pt performed correctly and w/o pain. Provided w/ written handout including written instructions to perform exercises w/ wife next to him and in sets of 10 (except 5 sit<>stands), 1x per day, and to build up to 3 sets per day. Pt verbalized understanding of education. Pt continued to report increased fatigue and lightheadedness, requesting to lay down after lunch. Returned  to room via w/c and ended session in w/c, all needs in reach. Made NT aware of status. Missed 15 min of skilled PT 2/2 fatigue/symptomatic orthostasis.   Patient has met 11 of 11 long term goals due to improved activity tolerance, improved balance, improved postural control, increased strength, decreased pain, ability to compensate for deficits, improved attention, improved awareness and improved coordination.  Patient to discharge at an ambulatory level Supervision.   Patient's care partner is independent to provide the necessary physical assistance at discharge.  Reasons goals not met: n/a  Recommendation:  Patient will benefit from ongoing skilled PT services in home health setting to continue to advance safe functional mobility, address ongoing impairments in functional strength, initiation and termination of task, L hip ROM and flexibility, and endurance, and minimize fall risk.  Equipment: RW and 18x20 w/c  Reasons for discharge: treatment goals met and discharge from hospital  Patient/family agrees with progress made and goals achieved: Yes  PT Discharge Precautions/Restrictions Precautions Precautions: Fall Precaution Comments: history of parkinson's Restrictions Weight Bearing Restrictions: Yes LLE Weight Bearing: Weight bearing as tolerated Vital Signs Therapy Vitals Temp: 99 F (37.2 C) Temp Source: Oral Pulse Rate: 78 BP: (!) 142/76 Oxygen Therapy SpO2: 95 % Vision/Perception  Praxis Praxis: Intact  Cognition Overall Cognitive Status: History of cognitive impairments - at baseline Arousal/Alertness: Awake/alert Orientation Level: Oriented X4 Attention: Selective Focused Attention: Appears intact Sustained Attention: Appears intact Selective Attention: Impaired Memory: Impaired Memory Impairment: Decreased short term memory;Retrieval deficit Awareness: Impaired Problem Solving: Impaired Safety/Judgment: Impaired Comments: decreased awareness of deficits,  decreased safety awareness, all of which appear to be baseline Sensation Sensation Light  Touch: Appears Intact Coordination Gross Motor Movements are Fluid and Coordinated: No Fine Motor Movements are Fluid and Coordinated: No Motor  Motor Motor: Abnormal postural alignment and control Motor - Discharge Observations: Pt will slow movements, occasional shuffling/freezing of gait, and tremors 2/2 history of Parkinson's  Mobility Bed Mobility Bed Mobility: Rolling Right;Rolling Left;Supine to Sit;Sit to Supine Rolling Right: Supervision/verbal cueing Rolling Left: Supervision/Verbal cueing Supine to Sit: Supervision/Verbal cueing Sit to Supine: Supervision/Verbal cueing Transfers Transfers: Sit to Stand;Stand to Sit;Stand Pivot Transfers Sit to Stand: Supervision/Verbal cueing Stand to Sit: Supervision/Verbal cueing Stand Pivot Transfers: Supervision/Verbal cueing Stand Pivot Transfer Details: Verbal cues for technique;Verbal cues for precautions/safety Transfer (Assistive device): Rolling walker Locomotion  Gait Ambulation: Yes Gait Assistance: Supervision/Verbal cueing Gait Distance (Feet): 150 Feet Assistive device: Rolling walker Gait Gait: Yes Gait Pattern: Impaired Gait Pattern: Wide base of support;Trunk flexed;Step-to pattern;Shuffle Gait velocity: decreased Stairs / Additional Locomotion Stairs: Yes Stairs Assistance: Contact Guard/Touching assist Stair Management Technique: One rail Right;One rail Left(has done 12 w/ R rail only and 12 w/ L rail only) Number of Stairs: 12 Height of Stairs: 6 Wheelchair Mobility Wheelchair Mobility: Yes Wheelchair Assistance: Chartered loss adjuster: Both upper extremities Wheelchair Parts Management: Needs assistance Distance: 150'  Trunk/Postural Assessment  Cervical Assessment Cervical Assessment: Exceptions to WFL(forward head posture) Thoracic Assessment Thoracic Assessment: Exceptions to  WFL(rounded shoulders) Lumbar Assessment Lumbar Assessment: Exceptions to WFL(posterior pelvic tilt) Postural Control Postural Control: Deficits on evaluation Postural Limitations: Flexed posture in standing, delayed and insufficient righting reactions  Balance Static Sitting Balance Static Sitting - Balance Support: Feet supported Static Sitting - Level of Assistance: 5: Stand by assistance Dynamic Sitting Balance Dynamic Sitting - Balance Support: During functional activity;Feet supported Static Standing Balance Static Standing - Balance Support: During functional activity;Bilateral upper extremity supported Static Standing - Level of Assistance: 5: Stand by assistance Dynamic Standing Balance Dynamic Standing - Balance Support: During functional activity;No upper extremity supported Dynamic Standing - Level of Assistance: 5: Stand by assistance Extremity Assessment  RLE Assessment RLE Assessment: Within Functional Limits LLE Assessment Passive Range of Motion (PROM) Comments: Limited 2/2 anterior THA precautions General Strength Comments: 4+ to 5/5 globally, mild pain w/ active movement, resolves when relaxed    Dodd Schmid K Treshun Wold 12/28/2018, 9:03 AM

## 2018-12-29 NOTE — Progress Notes (Signed)
[place] PHYSICAL MEDICINE & REHABILITATION PROGRESS NOTE  Subjective/Complaints:  Took some pain medication this am, feels ready to go home  ROS: Denies CP, SOB, N/V/D   Objective: Vital Signs: Blood pressure (!) 150/89, pulse 80, temperature 98.4 F (36.9 C), temperature source Oral, resp. rate 14, weight 133.6 kg, SpO2 97 %. No results found. No results for input(s): WBC, HGB, HCT, PLT in the last 72 hours. No results for input(s): NA, K, CL, CO2, GLUCOSE, BUN, CREATININE, CALCIUM in the last 72 hours.  Physical Exam: BP (!) 150/89 (BP Location: Left Wrist)   Pulse 80   Temp 98.4 F (36.9 C) (Oral)   Resp 14   Wt 133.6 kg   SpO2 97%   BMI 37.82 kg/m  Constitutional: No distress . Vital signs reviewed. HENT: Normocephalic.  Atraumatic. Eyes: EOMI. No discharge. Cardiovascular: RRR.  No JVD. Respiratory: CTA bilaterally. Normal effort. GI: BS +. Non-distended. Musc: No edema or tenderness in extremities. Musculoskeletal: Edema and tenderness in left hip, unchanged Neurological:Alert and oriented Mask facies, stable.  Dysarthria, unchanged.  Motor:  LLE: hip flexion, knee extension 4/5, ankle dorsiflexion 4+/5 RLE: 4+/5 proximal to distal, unchanged Skin: left hip incision c/d/i with resolving hematoma Psychiatric: flat  Assessment/Plan: 1. Functional deficits secondary to left hip fracture with history of Parkinson's disease Stable for D/C today F/u PCP in 3-4 weeks F/u PM&R 2 weeks See D/C summary See D/C instructions Care Tool:  Bathing    Body parts bathed by patient: Left arm, Right arm, Chest, Front perineal area, Abdomen, Right upper leg, Buttocks, Left upper leg, Right lower leg, Left lower leg, Face   Body parts bathed by helper: Right lower leg, Left lower leg, Buttocks     Bathing assist Assist Level: Supervision/Verbal cueing     Upper Body Dressing/Undressing Upper body dressing   What is the patient wearing?: Pull over shirt    Upper  body assist Assist Level: Set up assist    Lower Body Dressing/Undressing Lower body dressing      What is the patient wearing?: Pants     Lower body assist Assist for lower body dressing: Supervision/Verbal cueing     Toileting Toileting Toileting Activity did not occur (Clothing management and hygiene only): Safety/medical concerns  Toileting assist Assist for toileting: Contact Guard/Touching assist     Transfers Chair/bed transfer  Transfers assist  Chair/bed transfer activity did not occur: Safety/medical concerns  Chair/bed transfer assist level: Contact Guard/Touching assist     Locomotion Ambulation   Ambulation assist   Ambulation activity did not occur: Safety/medical concerns  Assist level: Contact Guard/Touching assist Assistive device: Walker-rolling Max distance: 150'   Walk 10 feet activity   Assist  Walk 10 feet activity did not occur: Safety/medical concerns  Assist level: Supervision/Verbal cueing Assistive device: Walker-rolling   Walk 50 feet activity   Assist Walk 50 feet with 2 turns activity did not occur: Safety/medical concerns  Assist level: Supervision/Verbal cueing Assistive device: Walker-rolling    Walk 150 feet activity   Assist Walk 150 feet activity did not occur: Safety/medical concerns  Assist level: Supervision/Verbal cueing Assistive device: Walker-rolling    Walk 10 feet on uneven surface  activity   Assist Walk 10 feet on uneven surfaces activity did not occur: Safety/medical concerns   Assist level: Contact Guard/Touching assist Assistive device: Aeronautical engineer Will patient use wheelchair at discharge?: (PRN) Type of Wheelchair: Manual    Wheelchair assist level: Supervision/Verbal  cueing Max wheelchair distance: 150'    Wheelchair 50 feet with 2 turns activity    Assist        Assist Level: Supervision/Verbal cueing   Wheelchair 150 feet activity      Assist     Assist Level: Supervision/Verbal cueing      Medical Problem List and Plan: 1.Decreased functional mobilitysecondary to Left femoral neck fracture. 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 Pt with parkinson's disease Weightbearing as tolerated  Continue CIR  Plan for d/c today  Dr Posey Pronto see patient for transitional care management in 1-2 weeks post-discharge 2. Antithrombotics: -DVT/anticoagulation:Subcutaneous Lovenox. vascular study negative for DVT -antiplatelet therapy: N/A 3. Pain Management:  Scheduled tylenol, local ice/ heat  Have stopped Oxy and Robaxin due to lethargy  Controlled on 3/27 4. Mood:Wellbutrin 150 mg daily and 300 mg daily at bedtime  Celexa 40 mg daily, decreased to 20 mg on 3/11 due with prolonged QT  Provide emotional support  Depressed, appreciate neuropsych eval  Stable  Antipsychotic agents: no antipsychotics given PD 5. Neuropsych: This patientisnot fully capable of making decisions on hisown behalf. 6. Skin/Wound Care:Routine skin checks 7. Fluids/Electrolytes/Nutrition:Routine in and out's   BMP within acceptable range on 3/23  P.o. intake improved  Advanced to regular texture thin liquids with supervision, now intermittent 8. Acute blood loss anemia.   Hemoglobin 10.3 on 3/23  Continue to monitor 9. Parkinson disease. Sinemet 25-100 1-2 tablets 3 times a day, Comtan 200 mg 3 times a day,Requip 1 mg 3 times a day  10. Chronic idiopathic constipation. Linzess290 mcg daily  -colace stopped per wife request 11. Overactive bladder. Toviaz 4 mg daily  12.  Hyperglycemia  Improved. No longer checking CBG's 13.  Leukocytosis: Resolved  WBCs 8.2 on 3/23  UA relatively unremarkable, urine culture NG  Ammonia WNL  Chest x-ray without acute changes  Afebrile 14.  Hypoalbuminemia  Supplement initiated on 3/11  15.  Labile blood pressure- no change in meds may have  orthostatic componenet given PD Vitals:   12/28/18 2145 12/29/18 0603  BP: 127/74 (!) 150/89  Pulse: 79 80  Resp: (!) 21 14  Temp: 97.7 F (36.5 C) 98.4 F (36.9 C)  SpO2: 100% 97%    LOS: 18 days A FACE TO FACE EVALUATION WAS PERFORMED  Charlett Blake 12/29/2018, 9:26 AM

## 2018-12-30 DIAGNOSIS — Z9181 History of falling: Secondary | ICD-10-CM | POA: Diagnosis not present

## 2018-12-30 DIAGNOSIS — S72002D Fracture of unspecified part of neck of left femur, subsequent encounter for closed fracture with routine healing: Secondary | ICD-10-CM | POA: Diagnosis not present

## 2018-12-30 DIAGNOSIS — K5904 Chronic idiopathic constipation: Secondary | ICD-10-CM | POA: Diagnosis not present

## 2018-12-30 DIAGNOSIS — N3281 Overactive bladder: Secondary | ICD-10-CM | POA: Diagnosis not present

## 2018-12-30 DIAGNOSIS — G4733 Obstructive sleep apnea (adult) (pediatric): Secondary | ICD-10-CM | POA: Diagnosis not present

## 2018-12-30 DIAGNOSIS — F332 Major depressive disorder, recurrent severe without psychotic features: Secondary | ICD-10-CM | POA: Diagnosis not present

## 2018-12-30 DIAGNOSIS — G20 Parkinson's disease: Secondary | ICD-10-CM | POA: Diagnosis not present

## 2018-12-30 DIAGNOSIS — Z96642 Presence of left artificial hip joint: Secondary | ICD-10-CM | POA: Diagnosis not present

## 2018-12-30 DIAGNOSIS — I872 Venous insufficiency (chronic) (peripheral): Secondary | ICD-10-CM | POA: Diagnosis not present

## 2018-12-31 ENCOUNTER — Ambulatory Visit: Payer: Medicare Other | Admitting: Psychology

## 2018-12-31 NOTE — Progress Notes (Addendum)
Social Work  Discharge Note  The overall goal for the admission was met for:   Discharge location: Yes - home with wife and son who can provide 24/7 assistance  Length of Stay: Yes - 18 days  Discharge activity level: Yes - minimal assist overall  Home/community participation: Yes  Services provided included: MD, RD, PT, OT, SLP, RN, TR, Pharmacy, Neuropsych and SW  Financial Services: Medicare and Private Insurance: [place]  Follow-up services arranged: Home Health: PT, OT via [facility], DME: 20x18 lightweight w/c, cushion, wide rw, wide bsc via [place] and Patient/Family has no preference for HH/DME agencies  Comments (or additional information):  Contact info:  Wife, Kalim Kissel @ (H) [phone] or [phone]  Patient/Family verbalized understanding of follow-up arrangements: Yes  Individual responsible for coordination of the follow-up plan: pt  Confirmed correct DME delivered: Aleck Locklin, Lorre Nick 12/31/2018    Harrel Ferrone

## 2019-01-01 ENCOUNTER — Telehealth (INDEPENDENT_AMBULATORY_CARE_PROVIDER_SITE_OTHER): Payer: Self-pay | Admitting: Orthopaedic Surgery

## 2019-01-01 ENCOUNTER — Telehealth: Payer: Self-pay | Admitting: Internal Medicine

## 2019-01-01 DIAGNOSIS — F332 Major depressive disorder, recurrent severe without psychotic features: Secondary | ICD-10-CM | POA: Diagnosis not present

## 2019-01-01 DIAGNOSIS — S72002D Fracture of unspecified part of neck of left femur, subsequent encounter for closed fracture with routine healing: Secondary | ICD-10-CM | POA: Diagnosis not present

## 2019-01-01 DIAGNOSIS — G4733 Obstructive sleep apnea (adult) (pediatric): Secondary | ICD-10-CM | POA: Diagnosis not present

## 2019-01-01 DIAGNOSIS — G20 Parkinson's disease: Secondary | ICD-10-CM | POA: Diagnosis not present

## 2019-01-01 DIAGNOSIS — N3281 Overactive bladder: Secondary | ICD-10-CM | POA: Diagnosis not present

## 2019-01-01 DIAGNOSIS — I872 Venous insufficiency (chronic) (peripheral): Secondary | ICD-10-CM | POA: Diagnosis not present

## 2019-01-01 NOTE — Telephone Encounter (Unsigned)
Copied from [place] [phone]. Topic: Quick Communication - Home Health Verbal Orders >> Jan 01, 2019  4:00 PM Alanda Slim E wrote: Caller/Agency: Costella Hatcher / [facility]  Callback Number: [phone] Requesting OT Frequency: 2x a week for 4 weeks  1x a week for 1 week

## 2019-01-01 NOTE — Telephone Encounter (Signed)
Informed [name] [name] to contact orthopedics doctor

## 2019-01-01 NOTE — Telephone Encounter (Signed)
Should come from orthopedics or can make hospital follow up

## 2019-01-01 NOTE — Telephone Encounter (Signed)
Clair Gulling with [facility] requesting verbal orders for OT for 2times a week for 4 weeks & 1time a week for 1 week. Jim's callback # [phone]

## 2019-01-01 NOTE — Telephone Encounter (Signed)
[name] [name] with [facility] request verbal orders for 3 times a week for 4 weeks. [name] [name]'s callback # [phone]

## 2019-01-01 NOTE — Telephone Encounter (Signed)
Called to give verbal orders  

## 2019-01-02 ENCOUNTER — Other Ambulatory Visit: Payer: Self-pay

## 2019-01-02 ENCOUNTER — Encounter: Payer: Self-pay | Admitting: Registered Nurse

## 2019-01-02 ENCOUNTER — Encounter: Payer: Medicare Other | Attending: Registered Nurse | Admitting: Registered Nurse

## 2019-01-02 DIAGNOSIS — G20 Parkinson's disease: Secondary | ICD-10-CM | POA: Diagnosis not present

## 2019-01-02 DIAGNOSIS — S72002A Fracture of unspecified part of neck of left femur, initial encounter for closed fracture: Secondary | ICD-10-CM

## 2019-01-02 DIAGNOSIS — F339 Major depressive disorder, recurrent, unspecified: Secondary | ICD-10-CM

## 2019-01-02 DIAGNOSIS — K5904 Chronic idiopathic constipation: Secondary | ICD-10-CM | POA: Diagnosis not present

## 2019-01-02 NOTE — Progress Notes (Signed)
Virtual Transitional Care call  Patient name: [name] [name] DOB: 04/01/1951 1. Are you/is patient experiencing any problems since coming home? No a. Are there any questions regarding any aspect of care? No 2. Are there any questions regarding medications administration/dosing? No a. Are meds being taken as prescribed? Yes b. "Patient should review meds with caller to confirm" Medication List Reviewed 3. Have there been any falls? No 4. Has Home Health been to the house and/or have they contacted you? Yes, [facility] a. If not, have you tried to contact them? NA b. Can we help you contact them? NA 5. Are bowels and bladder emptying properly? Yes, Bladder, Chronic Constipation on Linzess. [name] was instructed to continue to monitor, she verbalizes understanding.  a. Are there any unexpected incontinence issues? NA b. If applicable, is patient following bowel/bladder programs?NA 6. Any fevers, problems with breathing, unexpected pain? No 7. Are there any skin problems or new areas of breakdown? No 8. Has the patient/family member arranged specialty MD follow up (ie cardiology/neurology/renal/surgical/etc.)?  HFU appointments have been scheduled, also awaiting a return call from Dr. Erlinda Hong office she reports. She was asked to call our office if she hasn't heard from them by Friday 01/04/2019, she verbalizes understanding.  a. Can we help arrange? NA 9. Does the patient need any other services or support that we can help arrange? No 10. Are caregivers following through as expected in assisting the patient? Yes 11. Has the patient quit smoking, drinking alcohol, or using drugs as recommended? [name] reports [name] doesn't smoke, drink alcohol or use illicit drugs.   Appointment date/time 05/04/ 2020  arrival time 10:00 for 10:20 appointment with Dr. Posey Pronto. At [facility]

## 2019-01-02 NOTE — Progress Notes (Signed)
*  Late entry*  Patient received discharge instructions, Friday, 12/28/2018, from Silvestre Mesi, PA-C with spouse listening via telephone with verbal understanding. Patient discharged to home with spouse and patient belongings. All questions answered to patient and spouse's satisfaction.

## 2019-01-02 NOTE — Telephone Encounter (Signed)
Called Clair Gulling back to advise and approve orders. Thank you.

## 2019-01-03 ENCOUNTER — Ambulatory Visit (INDEPENDENT_AMBULATORY_CARE_PROVIDER_SITE_OTHER): Payer: Medicare Other | Admitting: Psychology

## 2019-01-03 DIAGNOSIS — I872 Venous insufficiency (chronic) (peripheral): Secondary | ICD-10-CM | POA: Diagnosis not present

## 2019-01-03 DIAGNOSIS — F332 Major depressive disorder, recurrent severe without psychotic features: Secondary | ICD-10-CM | POA: Diagnosis not present

## 2019-01-03 DIAGNOSIS — G20 Parkinson's disease: Secondary | ICD-10-CM | POA: Diagnosis not present

## 2019-01-03 DIAGNOSIS — N3281 Overactive bladder: Secondary | ICD-10-CM | POA: Diagnosis not present

## 2019-01-03 DIAGNOSIS — F4323 Adjustment disorder with mixed anxiety and depressed mood: Secondary | ICD-10-CM

## 2019-01-03 DIAGNOSIS — S72002D Fracture of unspecified part of neck of left femur, subsequent encounter for closed fracture with routine healing: Secondary | ICD-10-CM | POA: Diagnosis not present

## 2019-01-03 DIAGNOSIS — G4733 Obstructive sleep apnea (adult) (pediatric): Secondary | ICD-10-CM | POA: Diagnosis not present

## 2019-01-03 NOTE — Progress Notes (Signed)
Virtual Visit via Video Note The purpose of this virtual visit is to provide medical care while limiting exposure to the novel coronavirus.    Consent was obtained for video visit:  Yes.   Answered questions that patient had about telehealth interaction:  Yes.   I discussed the limitations, risks, security and privacy concerns of performing an evaluation and management service by telemedicine. I also discussed with the patient that there may be a patient responsible charge related to this service. The patient expressed understanding and agreed to proceed.  Pt location: Home Physician Location: office Name of referring provider:  [name], * I connected with [name] [name] at patients initiation/request on 01/04/2019 at 10:00 AM EDT by video enabled telemedicine application and verified that I am speaking with the correct person using two identifiers. Pt MRN:  [id number] Pt DOB:  1950/10/19 Video Participants:  [name];  wife, who supplements the history   History of Present Illness:  Patient is seen in follow-up for Parkinson's disease.  Much has happened since last visit.  I have reviewed an extensive number of records, including his hospital records.  He is on carbidopa/levodopa 25/100, 2/2/1.  Last visit, entacapone was added to each dose of levodopa.  He has had no trouble with that. Pt wasn't sure that it helped but wife thinks that it did.  Tremor had almost completely gone.   He is also on ropinirole, 1 mg 3 times per day.  Patient was in the hospital the large majority of March for a left femoral neck fracture.  He fell down the garage stairs.  This required surgery on December 09, 2018 and subsequently he went to inpatient rehab.  He did have some postoperative delirium, which did resolve.  Neurology was consulted while in rehab, mostly because his wife was frustrated that they felt he was not getting his Parkinson's medication on time.  Dr. Cheral Marker felt that most  of his symptoms were anxiety driven.  He was given Celexa while in the hospital, which was decreased from 40 mg to 20 mg because of prolonged QT.  He had a neurocognitive evaluation by Dr. Sima Matas on March 25.  It was felt that depression was fairly stable.  Patient reported that he was just frustrated that his wife was not able to come see him (coronavirus pandemic).  Patient was discharged from rehab on December 28, 2018 per records (but wife states 3/25) .  He has been home a week.  [facility] has started in the home.  He now has handrails where he didn't before.  He is sleeping now downstairs because bedroom is upstairs and PT is going to try and work with getting him upstairs.  He is still getting sponge baths.  He is using a walker and cane.  He has no pain and is off of pain meds but is constipated.  Enema caused confusion in the hospital, and was given with Colace, so wife does not want Colace any longer.  Current Outpatient Medications on File Prior to Visit  Medication Sig Dispense Refill  . B Complex-C (B-COMPLEX WITH VITAMIN C) tablet Take 1 tablet by mouth daily.    Marland Kitchen buPROPion (WELLBUTRIN XL) 150 MG 24 hr tablet Take 1 tablet (150 mg total) by mouth every morning. (Patient taking differently: Take 150 mg by mouth every evening. ) 90 tablet 0  . buPROPion (WELLBUTRIN XL) 300 MG 24 hr tablet TAKE 1 TABLET BY MOUTH EVERY DAY IN THE  MORNING (Patient taking differently: Take 300 mg by mouth every morning. ) 90 tablet 0  . carbidopa-levodopa (SINEMET IR) 25-100 MG tablet 2 IN THE MORNING, 2 IN THE AFTERNOON, AND 1 IN THE EVENING (Patient taking differently: Take 1-2 tablets by mouth See admin instructions. Take 2 tabs at 7am, Take 2 tabs at 11 am, and Take 1 tabs at 4 pm.) 450 tablet 1  . citalopram (CELEXA) 20 MG tablet Take 1 tablet (20 mg total) by mouth daily. (Patient taking differently: Take 40 mg by mouth daily. ) 30 tablet 0  . entacapone (COMTAN) 200 MG tablet Take 1 tablet (200 mg  total) by mouth 3 (three) times daily. 90 tablet 5  . fesoterodine (TOVIAZ) 4 MG TB24 tablet Take 1 tablet (4 mg total) by mouth daily. 30 tablet 0  . linaclotide (LINZESS) 290 MCG CAPS capsule TAKE 1 CAPSULE BY MOUTH DAILY BEFORE BREAKFAST. 30 capsule 5  . Omega 3-6-9 Fatty Acids (OMEGA 3-6-9 PO) Take 1 capsule by mouth daily.     . polyethylene glycol (MIRALAX / GLYCOLAX) packet Take 17 g by mouth daily as needed for mild constipation. (Patient not taking: Reported on 12/11/2018) 14 each 0  . rOPINIRole (REQUIP) 1 MG tablet Take 1 tablet (1 mg total) by mouth 3 (three) times daily. 270 tablet 1  . TURMERIC CURCUMIN PO Take 1 capsule by mouth daily.      No current facility-administered medications on file prior to visit.       Observations/Objective:   Vitals:   01/04/19 0909  Weight: 290 lb (131.5 kg)  Height: 6\' 2"  (1.88 m)   GEN:  The patient appears stated age and is in NAD.  Patient has a flat affect.  Neurological examination:  Orientation: The patient is alert and oriented x3. Cranial nerves: There is good facial symmetry.  He does have facial hypomimia.  The speech is fluent and clear.  He is hypophonic.  Soft palate rises symmetrically and there is no tongue deviation. Hearing is intact to conversational tone. Motor: Strength is at least antigravity x 4.   Shoulder shrug is equal and symmetric.  There is no pronator drift.  Movement examination: Tone: unable Abnormal movements: There is some left hand tremor when the patient attempts to arise out of the chair and is pushing off of the table. Coordination:  There is mild decremation with RAM's, with finger taps bilaterally.  I was unable to see his feet and they were unable to position the camera to allow me to see the feet to test toe or heel taps. Gait and Station: The patient pushes 3 times off of the table before he is able to arise.  He has a walker.  He is slow with a walker (but just had a hip fracture) and is short  stepped.  He turns en bloc.      Assessment and Plan:   1.  Parkinsonism             - continue requip 1 mg tid.  Higher dosage with memory change and EDS.  This was refilled today.             -continue carbidopa/levodopa 25/100 2/2/1             -Continue Comtan 200 mg tid with each dose of levodopa             -s/p hip fx in 12/2018 with post op delirium that quickly resolved.  Discussed that it will  take time to heal physically and to be patient as just got home from the hospital.  It appears that patient lacks confidence and ability to get better.  He and I talked about the fact that he will get better, and Parkinson symptoms will follow that.  He needs to work hard with home physical therapy.  -Did congratulate the patient that he is still working with a recumbent bike now that rock steady boxing and ACT are on hold because of the pandemic.  I will send him some other things that he can do online. 2  Depression             -Despite denial, I continue to think that depression and mood drive his feelings of not doing well physically.  Neurocognitive testing by 2 different neuropsychologists (Drs. Si Raider and Sima Matas have affirmed this belief) 3.  Sialorrhea             -This is commonly associated with PD.  We talked about treatments.  The patient is not a candidate for oral anticholinergic therapy because of increased risk of confusion and falls.  We discussed Botox (type A and B) and 1% atropine drops.  We discusssed that candy like lemon drops can help by stimulating mm of the oropharynx to induce swallowing.  He doesn't want to pursue these options 4.  Dizziness and occasional Orthostatic hypotension             -drinking plenty of fluid and doing well with that.             -has abdominal compression binder but isn't using.  5.  Memory loss             -Patient has twice had neurocognitive testing, the last of which was in December 2017, and then had a shorter evaluation in the hospital  in 12/2018.  I have not seen any significant cognitive decline, despite continued complaints.  Last testing Dr. Si Raider felt that some of the issues related to the patient's personality of seeing the world in a negative light.  I tend to agree with this.   6.  Urinary incontinence, frequency and nocturia             -Wife asked me about this again today.  We have discussed this at previous visits.  He is already following with urology at [facility] urology but admits that hasn't been there in a long time (about a year).  Told to make a follow appointment.  She asks about leg swelling.  Urology had previously stopped the Toviaz to see if it would help the swelling, but his bladder function got worse and it was restarted.   7.  EDS             -Patient had nocturnal polysomnogram in August, 2017 with AHI 5.6.  Doing somewhat better with changes in Requip. 8.    Constipation  -States that he is drinking plenty of water.  -Wife does not want him to use Colace.  She felt that he got confused with this, but it was actually the enema in combination with the Colace.  They are going to try MiraLAX.  Constipation got worse when in the hospital for his hip fracture, which did require some short-term opioids.  I think he will get better once he is moving around more.  Follow Up Instructions:    -I discussed the assessment and treatment plan with the patient. The patient was provided an opportunity to  ask questions and all were answered. The patient agreed with the plan and demonstrated an understanding of the instructions.   The patient was advised to call back or seek an in-person evaluation if the symptoms worsen or if the condition fails to improve as anticipated.    Total Time spent in visit with the patient was:  25 min, of which more than 50% of the time was spent in counseling and/or coordinating care on safety in the home.   Pt understands and agrees with the plan of care outlined.  Time mentioned did  not  include the 40 min of record review which was detailed above, which was non face to face time.    Alonza Bogus, DO

## 2019-01-04 ENCOUNTER — Telehealth (INDEPENDENT_AMBULATORY_CARE_PROVIDER_SITE_OTHER): Payer: Medicare Other | Admitting: Neurology

## 2019-01-04 ENCOUNTER — Other Ambulatory Visit (HOSPITAL_COMMUNITY): Payer: Self-pay | Admitting: Psychiatry

## 2019-01-04 ENCOUNTER — Other Ambulatory Visit: Payer: Self-pay

## 2019-01-04 ENCOUNTER — Encounter: Payer: Self-pay | Admitting: Neurology

## 2019-01-04 DIAGNOSIS — K5901 Slow transit constipation: Secondary | ICD-10-CM

## 2019-01-04 DIAGNOSIS — F332 Major depressive disorder, recurrent severe without psychotic features: Secondary | ICD-10-CM

## 2019-01-04 DIAGNOSIS — G20 Parkinson's disease: Secondary | ICD-10-CM

## 2019-01-04 DIAGNOSIS — S72009D Fracture of unspecified part of neck of unspecified femur, subsequent encounter for closed fracture with routine healing: Secondary | ICD-10-CM | POA: Diagnosis not present

## 2019-01-04 DIAGNOSIS — F331 Major depressive disorder, recurrent, moderate: Secondary | ICD-10-CM | POA: Diagnosis not present

## 2019-01-04 DIAGNOSIS — F411 Generalized anxiety disorder: Secondary | ICD-10-CM

## 2019-01-04 MED ORDER — ROPINIROLE HCL 1 MG PO TABS: 1.0000 mg | ORAL_TABLET | Freq: Three times a day (TID) | ORAL | 1 refills | Status: DC

## 2019-01-07 DIAGNOSIS — N3281 Overactive bladder: Secondary | ICD-10-CM | POA: Diagnosis not present

## 2019-01-07 DIAGNOSIS — I872 Venous insufficiency (chronic) (peripheral): Secondary | ICD-10-CM | POA: Diagnosis not present

## 2019-01-07 DIAGNOSIS — G20 Parkinson's disease: Secondary | ICD-10-CM | POA: Diagnosis not present

## 2019-01-07 DIAGNOSIS — G4733 Obstructive sleep apnea (adult) (pediatric): Secondary | ICD-10-CM | POA: Diagnosis not present

## 2019-01-07 DIAGNOSIS — S72002D Fracture of unspecified part of neck of left femur, subsequent encounter for closed fracture with routine healing: Secondary | ICD-10-CM | POA: Diagnosis not present

## 2019-01-07 DIAGNOSIS — F332 Major depressive disorder, recurrent severe without psychotic features: Secondary | ICD-10-CM | POA: Diagnosis not present

## 2019-01-08 DIAGNOSIS — I872 Venous insufficiency (chronic) (peripheral): Secondary | ICD-10-CM | POA: Diagnosis not present

## 2019-01-08 DIAGNOSIS — S72002D Fracture of unspecified part of neck of left femur, subsequent encounter for closed fracture with routine healing: Secondary | ICD-10-CM | POA: Diagnosis not present

## 2019-01-08 DIAGNOSIS — G4733 Obstructive sleep apnea (adult) (pediatric): Secondary | ICD-10-CM | POA: Diagnosis not present

## 2019-01-08 DIAGNOSIS — G20 Parkinson's disease: Secondary | ICD-10-CM | POA: Diagnosis not present

## 2019-01-08 DIAGNOSIS — F332 Major depressive disorder, recurrent severe without psychotic features: Secondary | ICD-10-CM | POA: Diagnosis not present

## 2019-01-08 DIAGNOSIS — N3281 Overactive bladder: Secondary | ICD-10-CM | POA: Diagnosis not present

## 2019-01-09 ENCOUNTER — Other Ambulatory Visit (HOSPITAL_COMMUNITY): Payer: Self-pay | Admitting: Psychiatry

## 2019-01-09 ENCOUNTER — Telehealth (INDEPENDENT_AMBULATORY_CARE_PROVIDER_SITE_OTHER): Payer: Self-pay | Admitting: Orthopaedic Surgery

## 2019-01-09 ENCOUNTER — Telehealth: Payer: Self-pay | Admitting: Registered Nurse

## 2019-01-09 DIAGNOSIS — F332 Major depressive disorder, recurrent severe without psychotic features: Secondary | ICD-10-CM | POA: Diagnosis not present

## 2019-01-09 DIAGNOSIS — G4733 Obstructive sleep apnea (adult) (pediatric): Secondary | ICD-10-CM | POA: Diagnosis not present

## 2019-01-09 DIAGNOSIS — N3281 Overactive bladder: Secondary | ICD-10-CM | POA: Diagnosis not present

## 2019-01-09 DIAGNOSIS — G20 Parkinson's disease: Secondary | ICD-10-CM | POA: Diagnosis not present

## 2019-01-09 DIAGNOSIS — I872 Venous insufficiency (chronic) (peripheral): Secondary | ICD-10-CM | POA: Diagnosis not present

## 2019-01-09 DIAGNOSIS — S72002D Fracture of unspecified part of neck of left femur, subsequent encounter for closed fracture with routine healing: Secondary | ICD-10-CM | POA: Diagnosis not present

## 2019-01-09 NOTE — Telephone Encounter (Signed)
Return [name] [name] called, she reports she hasn't heard from Dr. Erlinda Hong office. This provider called Dr. Erlinda Hong office the staff member states they don't have a recors she called their office. Placed another call to [name] [name] asking her to call Dr. Erlinda Hong office, she verbalizes understanding.   [name] also states her dad [name] [name] was on his recumbent bicycle yesterday and leaned forward and fell backwards and landed on his back. He didn't seek medical attention she reports. Also states he has a small bruise, no injury noted. She states.

## 2019-01-09 NOTE — Telephone Encounter (Signed)
I tried calling back, no answer. LMVM advising that office currently closed but if husband in severe pain as described and unable to WTB should seek medical attention in ER or call office back in the morning for [place] to consult with Dr Erlinda Hong or that she could reach out to doctor on call for our office. Rogelia Boga

## 2019-01-09 NOTE — Telephone Encounter (Signed)
Patient's wife Joycelyn Schmid request a call back @ [phone]. She's very upset and states that her husband can barely walk and she can not get him up to bring him into the office. He's having severe pain and she does not know what to do.

## 2019-01-09 NOTE — Telephone Encounter (Signed)
Called to let you know Dr. Erlinda Hong has not called them. Stated they were supposed to let you know.

## 2019-01-10 ENCOUNTER — Inpatient Hospital Stay (INDEPENDENT_AMBULATORY_CARE_PROVIDER_SITE_OTHER): Payer: Medicare Other | Admitting: Orthopaedic Surgery

## 2019-01-10 DIAGNOSIS — N3281 Overactive bladder: Secondary | ICD-10-CM | POA: Diagnosis not present

## 2019-01-10 DIAGNOSIS — G20 Parkinson's disease: Secondary | ICD-10-CM | POA: Diagnosis not present

## 2019-01-10 DIAGNOSIS — F332 Major depressive disorder, recurrent severe without psychotic features: Secondary | ICD-10-CM | POA: Diagnosis not present

## 2019-01-10 DIAGNOSIS — S72002D Fracture of unspecified part of neck of left femur, subsequent encounter for closed fracture with routine healing: Secondary | ICD-10-CM | POA: Diagnosis not present

## 2019-01-10 DIAGNOSIS — G4733 Obstructive sleep apnea (adult) (pediatric): Secondary | ICD-10-CM | POA: Diagnosis not present

## 2019-01-10 DIAGNOSIS — I872 Venous insufficiency (chronic) (peripheral): Secondary | ICD-10-CM | POA: Diagnosis not present

## 2019-01-10 NOTE — Telephone Encounter (Signed)
Per Erlinda Hong have patients wife email him a picture. Called to let her know.  She will be emailing you a picture. FYI

## 2019-01-11 ENCOUNTER — Telehealth (HOSPITAL_COMMUNITY): Payer: Self-pay | Admitting: *Deleted

## 2019-01-11 DIAGNOSIS — F411 Generalized anxiety disorder: Secondary | ICD-10-CM

## 2019-01-11 DIAGNOSIS — F332 Major depressive disorder, recurrent severe without psychotic features: Secondary | ICD-10-CM

## 2019-01-11 MED ORDER — CITALOPRAM HYDROBROMIDE 40 MG PO TABS: 40.0000 mg | ORAL_TABLET | Freq: Every day | ORAL | 0 refills | Status: DC

## 2019-01-11 NOTE — Telephone Encounter (Signed)
Patients wife called stating that the pharmacy, [facility], called her and told her they can't get [facility] to respond to their reply for refills. Wife called upset asking for refills of Wellbutrin and Celexa and needing them called today. Explained that Wellbutrin was given for a 90 day supply and would not be needed. After checking patients bottles while on the phone with this writer, she noticed he did not need refills. She was also upset stating that her husband went inpatient at the hospital and they "messed up his Celexa." He is currently on 20mg  and she is giving him 40mg  that he was on previously. She would like a new prescription for 40mg  to be sent to his pharmacy. She states that his Dr Adele Schilder) is aware of this.  She states he has enough "for a little while", but wants it corrected in his chart.

## 2019-01-11 NOTE — Telephone Encounter (Signed)
Message received from patient's wife that she need refill on Celexa 40 mg.  I reviewed discharge summary.  Patient was discharged on 20 mg from recent hospitalization from medical floor.  However wife is giving him 40 mg and does not want to change.  I tried to return phone call twice but unable to connect and unable to leave message.  We will resume 40 mg Celexa as patient is getting 40 mg daily.

## 2019-01-14 ENCOUNTER — Ambulatory Visit (INDEPENDENT_AMBULATORY_CARE_PROVIDER_SITE_OTHER): Payer: Medicare Other | Admitting: Psychology

## 2019-01-14 ENCOUNTER — Ambulatory Visit: Payer: Medicare Other | Admitting: Psychology

## 2019-01-14 ENCOUNTER — Ambulatory Visit: Payer: Self-pay | Admitting: Neurology

## 2019-01-14 ENCOUNTER — Encounter

## 2019-01-14 DIAGNOSIS — F332 Major depressive disorder, recurrent severe without psychotic features: Secondary | ICD-10-CM | POA: Diagnosis not present

## 2019-01-14 DIAGNOSIS — S72002D Fracture of unspecified part of neck of left femur, subsequent encounter for closed fracture with routine healing: Secondary | ICD-10-CM | POA: Diagnosis not present

## 2019-01-14 DIAGNOSIS — G20 Parkinson's disease: Secondary | ICD-10-CM | POA: Diagnosis not present

## 2019-01-14 DIAGNOSIS — I872 Venous insufficiency (chronic) (peripheral): Secondary | ICD-10-CM | POA: Diagnosis not present

## 2019-01-14 DIAGNOSIS — F4323 Adjustment disorder with mixed anxiety and depressed mood: Secondary | ICD-10-CM | POA: Diagnosis not present

## 2019-01-14 DIAGNOSIS — G4733 Obstructive sleep apnea (adult) (pediatric): Secondary | ICD-10-CM | POA: Diagnosis not present

## 2019-01-14 DIAGNOSIS — N3281 Overactive bladder: Secondary | ICD-10-CM | POA: Diagnosis not present

## 2019-01-15 DIAGNOSIS — I872 Venous insufficiency (chronic) (peripheral): Secondary | ICD-10-CM | POA: Diagnosis not present

## 2019-01-15 DIAGNOSIS — G20 Parkinson's disease: Secondary | ICD-10-CM | POA: Diagnosis not present

## 2019-01-15 DIAGNOSIS — G4733 Obstructive sleep apnea (adult) (pediatric): Secondary | ICD-10-CM | POA: Diagnosis not present

## 2019-01-15 DIAGNOSIS — F332 Major depressive disorder, recurrent severe without psychotic features: Secondary | ICD-10-CM | POA: Diagnosis not present

## 2019-01-15 DIAGNOSIS — S72002D Fracture of unspecified part of neck of left femur, subsequent encounter for closed fracture with routine healing: Secondary | ICD-10-CM | POA: Diagnosis not present

## 2019-01-15 DIAGNOSIS — N3281 Overactive bladder: Secondary | ICD-10-CM | POA: Diagnosis not present

## 2019-01-16 DIAGNOSIS — S72002D Fracture of unspecified part of neck of left femur, subsequent encounter for closed fracture with routine healing: Secondary | ICD-10-CM | POA: Diagnosis not present

## 2019-01-16 DIAGNOSIS — I872 Venous insufficiency (chronic) (peripheral): Secondary | ICD-10-CM | POA: Diagnosis not present

## 2019-01-16 DIAGNOSIS — F332 Major depressive disorder, recurrent severe without psychotic features: Secondary | ICD-10-CM | POA: Diagnosis not present

## 2019-01-16 DIAGNOSIS — G4733 Obstructive sleep apnea (adult) (pediatric): Secondary | ICD-10-CM | POA: Diagnosis not present

## 2019-01-16 DIAGNOSIS — N3281 Overactive bladder: Secondary | ICD-10-CM | POA: Diagnosis not present

## 2019-01-16 DIAGNOSIS — G20 Parkinson's disease: Secondary | ICD-10-CM | POA: Diagnosis not present

## 2019-01-17 DIAGNOSIS — G20 Parkinson's disease: Secondary | ICD-10-CM | POA: Diagnosis not present

## 2019-01-17 DIAGNOSIS — I872 Venous insufficiency (chronic) (peripheral): Secondary | ICD-10-CM | POA: Diagnosis not present

## 2019-01-17 DIAGNOSIS — S72002D Fracture of unspecified part of neck of left femur, subsequent encounter for closed fracture with routine healing: Secondary | ICD-10-CM | POA: Diagnosis not present

## 2019-01-17 DIAGNOSIS — F332 Major depressive disorder, recurrent severe without psychotic features: Secondary | ICD-10-CM | POA: Diagnosis not present

## 2019-01-17 DIAGNOSIS — N3281 Overactive bladder: Secondary | ICD-10-CM | POA: Diagnosis not present

## 2019-01-17 DIAGNOSIS — G4733 Obstructive sleep apnea (adult) (pediatric): Secondary | ICD-10-CM | POA: Diagnosis not present

## 2019-01-21 DIAGNOSIS — F332 Major depressive disorder, recurrent severe without psychotic features: Secondary | ICD-10-CM | POA: Diagnosis not present

## 2019-01-21 DIAGNOSIS — G4733 Obstructive sleep apnea (adult) (pediatric): Secondary | ICD-10-CM | POA: Diagnosis not present

## 2019-01-21 DIAGNOSIS — G20 Parkinson's disease: Secondary | ICD-10-CM | POA: Diagnosis not present

## 2019-01-21 DIAGNOSIS — S72002D Fracture of unspecified part of neck of left femur, subsequent encounter for closed fracture with routine healing: Secondary | ICD-10-CM | POA: Diagnosis not present

## 2019-01-21 DIAGNOSIS — N3281 Overactive bladder: Secondary | ICD-10-CM | POA: Diagnosis not present

## 2019-01-21 DIAGNOSIS — I872 Venous insufficiency (chronic) (peripheral): Secondary | ICD-10-CM | POA: Diagnosis not present

## 2019-01-22 DIAGNOSIS — G20 Parkinson's disease: Secondary | ICD-10-CM | POA: Diagnosis not present

## 2019-01-22 DIAGNOSIS — F332 Major depressive disorder, recurrent severe without psychotic features: Secondary | ICD-10-CM | POA: Diagnosis not present

## 2019-01-22 DIAGNOSIS — S72002D Fracture of unspecified part of neck of left femur, subsequent encounter for closed fracture with routine healing: Secondary | ICD-10-CM | POA: Diagnosis not present

## 2019-01-22 DIAGNOSIS — I872 Venous insufficiency (chronic) (peripheral): Secondary | ICD-10-CM | POA: Diagnosis not present

## 2019-01-22 DIAGNOSIS — G4733 Obstructive sleep apnea (adult) (pediatric): Secondary | ICD-10-CM | POA: Diagnosis not present

## 2019-01-22 DIAGNOSIS — N3281 Overactive bladder: Secondary | ICD-10-CM | POA: Diagnosis not present

## 2019-01-23 DIAGNOSIS — I872 Venous insufficiency (chronic) (peripheral): Secondary | ICD-10-CM | POA: Diagnosis not present

## 2019-01-23 DIAGNOSIS — G4733 Obstructive sleep apnea (adult) (pediatric): Secondary | ICD-10-CM | POA: Diagnosis not present

## 2019-01-23 DIAGNOSIS — N3281 Overactive bladder: Secondary | ICD-10-CM | POA: Diagnosis not present

## 2019-01-23 DIAGNOSIS — F332 Major depressive disorder, recurrent severe without psychotic features: Secondary | ICD-10-CM | POA: Diagnosis not present

## 2019-01-23 DIAGNOSIS — S72002D Fracture of unspecified part of neck of left femur, subsequent encounter for closed fracture with routine healing: Secondary | ICD-10-CM | POA: Diagnosis not present

## 2019-01-23 DIAGNOSIS — G20 Parkinson's disease: Secondary | ICD-10-CM | POA: Diagnosis not present

## 2019-01-24 ENCOUNTER — Telehealth (INDEPENDENT_AMBULATORY_CARE_PROVIDER_SITE_OTHER): Payer: Self-pay | Admitting: Orthopaedic Surgery

## 2019-01-24 DIAGNOSIS — S72002D Fracture of unspecified part of neck of left femur, subsequent encounter for closed fracture with routine healing: Secondary | ICD-10-CM | POA: Diagnosis not present

## 2019-01-24 DIAGNOSIS — G20 Parkinson's disease: Secondary | ICD-10-CM | POA: Diagnosis not present

## 2019-01-24 DIAGNOSIS — I872 Venous insufficiency (chronic) (peripheral): Secondary | ICD-10-CM | POA: Diagnosis not present

## 2019-01-24 DIAGNOSIS — G4733 Obstructive sleep apnea (adult) (pediatric): Secondary | ICD-10-CM | POA: Diagnosis not present

## 2019-01-24 DIAGNOSIS — N3281 Overactive bladder: Secondary | ICD-10-CM | POA: Diagnosis not present

## 2019-01-24 DIAGNOSIS — F332 Major depressive disorder, recurrent severe without psychotic features: Secondary | ICD-10-CM | POA: Diagnosis not present

## 2019-01-24 NOTE — Telephone Encounter (Signed)
Flor with [facility] left a message to speak to Dr. Erlinda Hong about a possible referral to a Education officer, museum.  CB#[phone].  Thank you.

## 2019-01-25 ENCOUNTER — Telehealth: Payer: Self-pay

## 2019-01-25 ENCOUNTER — Telehealth (INDEPENDENT_AMBULATORY_CARE_PROVIDER_SITE_OTHER): Payer: Self-pay

## 2019-01-25 DIAGNOSIS — S72002D Fracture of unspecified part of neck of left femur, subsequent encounter for closed fracture with routine healing: Secondary | ICD-10-CM | POA: Diagnosis not present

## 2019-01-25 DIAGNOSIS — F332 Major depressive disorder, recurrent severe without psychotic features: Secondary | ICD-10-CM | POA: Diagnosis not present

## 2019-01-25 DIAGNOSIS — I872 Venous insufficiency (chronic) (peripheral): Secondary | ICD-10-CM | POA: Diagnosis not present

## 2019-01-25 DIAGNOSIS — G20 Parkinson's disease: Secondary | ICD-10-CM | POA: Diagnosis not present

## 2019-01-25 DIAGNOSIS — N3281 Overactive bladder: Secondary | ICD-10-CM | POA: Diagnosis not present

## 2019-01-25 DIAGNOSIS — G4733 Obstructive sleep apnea (adult) (pediatric): Secondary | ICD-10-CM | POA: Diagnosis not present

## 2019-01-25 NOTE — Telephone Encounter (Signed)
Patient has already contacted orthopedics doctor for this and it has been approved by them and they will be taking care of this

## 2019-01-25 NOTE — Telephone Encounter (Signed)
Okay for Social worker referral?

## 2019-01-25 NOTE — Telephone Encounter (Signed)
Yes that's fine by me.  When can he come in for xrays.

## 2019-01-25 NOTE — Telephone Encounter (Signed)
Called to advise that pt had a fall this week. Walking dog and tripped over leash. Has abrasion on head. Hip is fine and relates fall to pts Parkinson's. Reviewed fall precautions with pt and wife. Just wanted to make you aware.

## 2019-01-25 NOTE — Telephone Encounter (Signed)
Copied from [place] [id number]. Topic: Referral - Request for Referral >> Jan 25, 2019 12:18 PM Richardo Priest, NT wrote: Has patient seen PCP for this complaint? No. Referral for which specialty: Social work at [facility] Preferred provider/office: Any, [facility] social work Reason for referral: Therapist, art with physical therapy called in on behalf of patient and his wife in regards to wanting a referral to be put in for social work for an evaluation. States wife is overwhelmed and would like to see options to continue husbands care. Call back is [phone].

## 2019-01-28 ENCOUNTER — Ambulatory Visit: Payer: Medicare Other | Admitting: Psychology

## 2019-01-28 NOTE — Telephone Encounter (Signed)
Called Flor back to approve orders. Also called patient but wife answered and advised when can she bring patient to get xrays.  She would like to wait a couple more weeks until he's a little bit stronger. Hip/leg is doing fine. Has good and bad days. (+) Parkinsons disease.   VO [phone]

## 2019-01-28 NOTE — Telephone Encounter (Signed)
ok 

## 2019-01-29 ENCOUNTER — Ambulatory Visit (INDEPENDENT_AMBULATORY_CARE_PROVIDER_SITE_OTHER): Payer: Medicare Other | Admitting: Psychology

## 2019-01-29 DIAGNOSIS — F4311 Post-traumatic stress disorder, acute: Secondary | ICD-10-CM

## 2019-01-29 DIAGNOSIS — F332 Major depressive disorder, recurrent severe without psychotic features: Secondary | ICD-10-CM | POA: Diagnosis not present

## 2019-01-29 DIAGNOSIS — Z9181 History of falling: Secondary | ICD-10-CM | POA: Diagnosis not present

## 2019-01-29 DIAGNOSIS — G20 Parkinson's disease: Secondary | ICD-10-CM | POA: Diagnosis not present

## 2019-01-29 DIAGNOSIS — I872 Venous insufficiency (chronic) (peripheral): Secondary | ICD-10-CM | POA: Diagnosis not present

## 2019-01-29 DIAGNOSIS — K5904 Chronic idiopathic constipation: Secondary | ICD-10-CM | POA: Diagnosis not present

## 2019-01-29 DIAGNOSIS — F4323 Adjustment disorder with mixed anxiety and depressed mood: Secondary | ICD-10-CM | POA: Diagnosis not present

## 2019-01-29 DIAGNOSIS — S72002D Fracture of unspecified part of neck of left femur, subsequent encounter for closed fracture with routine healing: Secondary | ICD-10-CM | POA: Diagnosis not present

## 2019-01-29 DIAGNOSIS — Z96642 Presence of left artificial hip joint: Secondary | ICD-10-CM | POA: Diagnosis not present

## 2019-01-29 DIAGNOSIS — N3281 Overactive bladder: Secondary | ICD-10-CM | POA: Diagnosis not present

## 2019-01-29 DIAGNOSIS — G4733 Obstructive sleep apnea (adult) (pediatric): Secondary | ICD-10-CM | POA: Diagnosis not present

## 2019-01-30 DIAGNOSIS — N3281 Overactive bladder: Secondary | ICD-10-CM | POA: Diagnosis not present

## 2019-01-30 DIAGNOSIS — S72002D Fracture of unspecified part of neck of left femur, subsequent encounter for closed fracture with routine healing: Secondary | ICD-10-CM | POA: Diagnosis not present

## 2019-01-30 DIAGNOSIS — G20 Parkinson's disease: Secondary | ICD-10-CM | POA: Diagnosis not present

## 2019-01-30 DIAGNOSIS — I872 Venous insufficiency (chronic) (peripheral): Secondary | ICD-10-CM | POA: Diagnosis not present

## 2019-01-30 DIAGNOSIS — F332 Major depressive disorder, recurrent severe without psychotic features: Secondary | ICD-10-CM | POA: Diagnosis not present

## 2019-01-30 DIAGNOSIS — G4733 Obstructive sleep apnea (adult) (pediatric): Secondary | ICD-10-CM | POA: Diagnosis not present

## 2019-01-31 DIAGNOSIS — I872 Venous insufficiency (chronic) (peripheral): Secondary | ICD-10-CM | POA: Diagnosis not present

## 2019-01-31 DIAGNOSIS — G4733 Obstructive sleep apnea (adult) (pediatric): Secondary | ICD-10-CM | POA: Diagnosis not present

## 2019-01-31 DIAGNOSIS — F332 Major depressive disorder, recurrent severe without psychotic features: Secondary | ICD-10-CM | POA: Diagnosis not present

## 2019-01-31 DIAGNOSIS — N3281 Overactive bladder: Secondary | ICD-10-CM | POA: Diagnosis not present

## 2019-01-31 DIAGNOSIS — G20 Parkinson's disease: Secondary | ICD-10-CM | POA: Diagnosis not present

## 2019-01-31 DIAGNOSIS — S72002D Fracture of unspecified part of neck of left femur, subsequent encounter for closed fracture with routine healing: Secondary | ICD-10-CM | POA: Diagnosis not present

## 2019-02-04 ENCOUNTER — Encounter: Payer: Medicare Other | Attending: Physical Medicine & Rehabilitation | Admitting: Physical Medicine & Rehabilitation

## 2019-02-04 ENCOUNTER — Encounter: Payer: Self-pay | Admitting: Physical Medicine & Rehabilitation

## 2019-02-04 ENCOUNTER — Other Ambulatory Visit: Payer: Self-pay

## 2019-02-04 VITALS — BP 110/70 | Temp 97.6°F | Ht 74.0 in | Wt 295.0 lb

## 2019-02-04 DIAGNOSIS — F339 Major depressive disorder, recurrent, unspecified: Secondary | ICD-10-CM

## 2019-02-04 DIAGNOSIS — G20 Parkinson's disease: Secondary | ICD-10-CM

## 2019-02-04 DIAGNOSIS — K5904 Chronic idiopathic constipation: Secondary | ICD-10-CM

## 2019-02-04 DIAGNOSIS — G4733 Obstructive sleep apnea (adult) (pediatric): Secondary | ICD-10-CM | POA: Diagnosis not present

## 2019-02-04 DIAGNOSIS — S72002A Fracture of unspecified part of neck of left femur, initial encounter for closed fracture: Secondary | ICD-10-CM

## 2019-02-04 DIAGNOSIS — I872 Venous insufficiency (chronic) (peripheral): Secondary | ICD-10-CM | POA: Diagnosis not present

## 2019-02-04 DIAGNOSIS — S72002D Fracture of unspecified part of neck of left femur, subsequent encounter for closed fracture with routine healing: Secondary | ICD-10-CM | POA: Diagnosis not present

## 2019-02-04 DIAGNOSIS — N3281 Overactive bladder: Secondary | ICD-10-CM

## 2019-02-04 DIAGNOSIS — R0989 Other specified symptoms and signs involving the circulatory and respiratory systems: Secondary | ICD-10-CM

## 2019-02-04 DIAGNOSIS — F332 Major depressive disorder, recurrent severe without psychotic features: Secondary | ICD-10-CM | POA: Diagnosis not present

## 2019-02-04 NOTE — Progress Notes (Signed)
Subjective:    Patient ID: [name] [name], male    DOB: [birth year], 68 y.o.   MRN: [id number]  TELEHEALTH NOTE  Due to national recommendations of social distancing due to COVID 19, an audio/video telehealth visit is felt to be most appropriate for this patient at this time.  See Chart message from today for the patient's consent to telehealth from [place].     I verified that I am speaking with the correct person using two identifiers.  Location of patient: Home Location of provider: Office Method of communication: WebEx Names of participants : Zorita Pang scheduling, Marland Mcalpine obtaining consent and vitals if available Established patient Time spent on call: 21 minutes  HPI 68 year old right-handed male with history of Parkinson's disease presents for follow-up for left hip fracture status post total hip replacement.  History supplemented by wife.  Since discharge, communication exchange between our NP and patient regarding follow-up visit with Ortho.  At discharge he was instructed to follow up with Dr. Carles Collet, no changed. He spoke with Ortho, who examined the wound and scheduled xray. He had 2 falls, once bending over to unplug something and the other time being tangled up with his dog. Wife notes PTSD from original fall.  Usually using walker for mobility, but can do without at times now. Pain is controlled. Mood is improved at home. Bowel movements are regular now. Urination back to normal. BPs have been controlled.  Therapies: PT/OT 2/week DME: Toilet seat Mobility: Gilford Rile most of the time  Pain Inventory Average Pain 3 Pain Right Now 2 My pain is intermittent, dull and aching  In the last 24 hours, has pain interfered with the following? General activity 3 Relation with others 3 Enjoyment of life 3 What TIME of day is your pain at its worst? varries Sleep (in general) Fair  Pain is worse with: inactivity and some activites  Pain improves with: therapy/exercise and pacing activities Relief from Meds: na  Mobility walk without assistance walk with assistance use a cane use a walker ability to climb steps?  no do you drive?  no  Function retired  Neuro/Psych bladder control problems weakness numbness tremor tingling trouble walking spasms dizziness confusion depression anxiety  Prior Studies Any changes since last visit?  no  Physicians involved in your care Any changes since last visit?  no   Family History  Problem Relation Age of Onset  . Lung cancer Father   . Alcohol abuse Brother   . Drug abuse Brother   . Seizures Daughter   . Colon cancer Neg Hx   . Stomach cancer Neg Hx   . Rectal cancer Neg Hx   . Esophageal cancer Neg Hx    Social History   Socioeconomic History  . Marital status: Married    Spouse name: Not on file  . Number of children: Y  . Years of education: Not on file  . Highest education level: Not on file  Occupational History  . 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 Financial resource strain: Not on file  . Food insecurity:    Worry: Not on file    Inability: Not on file  . Transportation needs:    Medical: Not on file    Non-medical: Not on file  Tobacco Use  . Smoking status: Never Smoker  . Smokeless tobacco: Never Used  Substance and Sexual Activity  .  Alcohol use: No    Alcohol/week: 0.0 standard drinks  . Drug use: No  . Sexual activity: Yes    Birth control/protection: None  Lifestyle  . Physical activity:    Days per week: Not on file    Minutes per session: Not on file  . Stress: Not on file  Relationships  . Social connections:    Talks on phone: Not on file    Gets together: Not on file    Attends religious service: Not on file    Active member of club or organization: Not on file    Attends meetings of clubs or organizations: Not on file    Relationship status: Not on  file  Other Topics Concern  . Not on file  Social History Narrative   [employer] partner    Past Surgical History:  Procedure Laterality Date  . APPENDECTOMY  1967  . COLONOSCOPY    . NASAL SINUS SURGERY     x 4 as a child  . TOTAL HIP ARTHROPLASTY Left 12/09/2018   Procedure: TOTAL HIP ARTHROPLASTY ANTERIOR APPROACH;  Surgeon: Leandrew Koyanagi, MD;  Location: [facility];  Service: Orthopedics;  Laterality: Left;  Marland Kitchen VASECTOMY     Past Medical History:  Diagnosis Date  . APPENDECTOMY, HX OF 02/18/2008   Qualifier: Diagnosis of  By: Danny Lawless CMA, [place]    . Cardiac murmur    as a child  . Chronic idiopathic constipation   . Depression   . Headache(784.0)   . HEADACHES, HX OF 02/18/2008   Qualifier: Diagnosis of  By: Danny Lawless CMA, [place]    . Parkinson disease ([facility]) 11/2014  . Sinus mucosal thickening    pt unable to lay flat  . Streptococcal meningitis    as an infant   BP 110/70 Comment: from last PT visit (stated)  Temp 97.6 F (36.4 C) Comment: from last PT visit (stated)  Ht 6\' 2"  (1.88 m)   Wt 295 lb (133.8 kg)   BMI 37.88 kg/m   Opioid Risk Score:   Fall Risk Score:  `1  Depression screen PHQ 2/9  No flowsheet data found.   Review of Systems  Constitutional: Negative.   HENT: Negative.   Eyes: Negative.   Respiratory: Negative.   Cardiovascular: Negative.   Gastrointestinal: Negative.   Endocrine: Negative.   Genitourinary: Negative.   Musculoskeletal: Positive for arthralgias, gait problem and myalgias.  Skin: Negative.   Allergic/Immunologic: Negative.   Neurological: Positive for dizziness, tremors, weakness and numbness.  Hematological: Negative.   Psychiatric/Behavioral: Positive for confusion and dysphoric mood. The patient is nervous/anxious.   All other systems reviewed and are negative.      Objective:   Physical Exam Gen: NAD. HENT: Normocephalic, Atraumatic Eyes: EOMI. No discharge.  Cardio: No JVD. Pulm: Effort normal Neuro: Alert      Assessment & Plan:  68 year old right-handed male with history of Parkinson's disease presents for follow-up for left hip fracture status post total hip replacement.  1.  Decreased functional mobility secondary to  Left femoral neck fracture. 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   Continue therapies  Follow-up with Ortho-x-ray scheduled  2. Mood:    Continue meds-patient notes improvement at home   3. Parkinson disease.   Continue meds   Continue follow-up with neurology   Wife notes decrease in function due to inability to engage with physical trainer as result of COVID-19   Continue home exercise program as possible  4. Chronic  idiopathic constipation.   Continue meds   Relatively controlled at present   5. Overactive bladder.   Continue meds   Relatively controlled at present   6.  Gait abnormality  Continue walker for safety  Continue therapies  7.  Labile blood pressure  Controlled at present per wife.

## 2019-02-05 DIAGNOSIS — I872 Venous insufficiency (chronic) (peripheral): Secondary | ICD-10-CM | POA: Diagnosis not present

## 2019-02-05 DIAGNOSIS — S72002D Fracture of unspecified part of neck of left femur, subsequent encounter for closed fracture with routine healing: Secondary | ICD-10-CM | POA: Diagnosis not present

## 2019-02-05 DIAGNOSIS — G4733 Obstructive sleep apnea (adult) (pediatric): Secondary | ICD-10-CM | POA: Diagnosis not present

## 2019-02-05 DIAGNOSIS — N3281 Overactive bladder: Secondary | ICD-10-CM | POA: Diagnosis not present

## 2019-02-05 DIAGNOSIS — F332 Major depressive disorder, recurrent severe without psychotic features: Secondary | ICD-10-CM | POA: Diagnosis not present

## 2019-02-05 DIAGNOSIS — G20 Parkinson's disease: Secondary | ICD-10-CM | POA: Diagnosis not present

## 2019-02-06 ENCOUNTER — Telehealth: Payer: Self-pay | Admitting: Neurology

## 2019-02-06 DIAGNOSIS — F332 Major depressive disorder, recurrent severe without psychotic features: Secondary | ICD-10-CM | POA: Diagnosis not present

## 2019-02-06 DIAGNOSIS — N3281 Overactive bladder: Secondary | ICD-10-CM | POA: Diagnosis not present

## 2019-02-06 DIAGNOSIS — I872 Venous insufficiency (chronic) (peripheral): Secondary | ICD-10-CM | POA: Diagnosis not present

## 2019-02-06 DIAGNOSIS — G4733 Obstructive sleep apnea (adult) (pediatric): Secondary | ICD-10-CM | POA: Diagnosis not present

## 2019-02-06 DIAGNOSIS — S72002D Fracture of unspecified part of neck of left femur, subsequent encounter for closed fracture with routine healing: Secondary | ICD-10-CM | POA: Diagnosis not present

## 2019-02-06 DIAGNOSIS — G20 Parkinson's disease: Secondary | ICD-10-CM | POA: Diagnosis not present

## 2019-02-06 NOTE — Telephone Encounter (Signed)
Patient wife called and was upset on the phone about how her husband is acting. She states that she cant get him to do anything not even drink water. She really would like a phone call back today

## 2019-02-06 NOTE — Telephone Encounter (Signed)
Needs to call pcp and see if has UTI, etc since this is acute and new for him.

## 2019-02-06 NOTE — Telephone Encounter (Signed)
Called pt wife--stated pt having hallucination (putting things out that is not there, conversation that never happen), Personality altered. Pt have not use the bathroom since this morning, and had 2 sip of water since this morning. Pt wife is really upset and dont know what to do. Please advise

## 2019-02-07 ENCOUNTER — Telehealth: Payer: Self-pay | Admitting: Internal Medicine

## 2019-02-07 ENCOUNTER — Other Ambulatory Visit: Payer: Self-pay | Admitting: Neurology

## 2019-02-07 ENCOUNTER — Other Ambulatory Visit: Payer: Self-pay

## 2019-02-07 ENCOUNTER — Ambulatory Visit (INDEPENDENT_AMBULATORY_CARE_PROVIDER_SITE_OTHER): Payer: Medicare Other | Admitting: Internal Medicine

## 2019-02-07 ENCOUNTER — Encounter: Payer: Self-pay | Admitting: Internal Medicine

## 2019-02-07 DIAGNOSIS — R410 Disorientation, unspecified: Secondary | ICD-10-CM | POA: Diagnosis not present

## 2019-02-07 DIAGNOSIS — F332 Major depressive disorder, recurrent severe without psychotic features: Secondary | ICD-10-CM | POA: Diagnosis not present

## 2019-02-07 DIAGNOSIS — N3281 Overactive bladder: Secondary | ICD-10-CM | POA: Diagnosis not present

## 2019-02-07 DIAGNOSIS — G20 Parkinson's disease: Secondary | ICD-10-CM | POA: Diagnosis not present

## 2019-02-07 DIAGNOSIS — S72002D Fracture of unspecified part of neck of left femur, subsequent encounter for closed fracture with routine healing: Secondary | ICD-10-CM | POA: Diagnosis not present

## 2019-02-07 DIAGNOSIS — G4733 Obstructive sleep apnea (adult) (pediatric): Secondary | ICD-10-CM | POA: Diagnosis not present

## 2019-02-07 DIAGNOSIS — I872 Venous insufficiency (chronic) (peripheral): Secondary | ICD-10-CM | POA: Diagnosis not present

## 2019-02-07 NOTE — Telephone Encounter (Signed)
We cannot give any home health orders as he did not come for face to face or virtual visit within 30 days of start and have not seen within 90 days. Can do virtual visit for I'm guessing urinary symptoms (no reason specified for U/A and culture request). Needs virtual visit for f/u if they need home health orders or they can contact another provider who is treating him now to see if they can supervise home health.

## 2019-02-07 NOTE — Telephone Encounter (Signed)
Copied from [place] [id number]. Topic: Quick Communication - Home Health Verbal Orders >> Feb 07, 2019 11:57 AM Berneta Levins wrote: Caller/Agency: Kenney Houseman from [facility] Number: [phone], OK to leave a message Requesting OT/PT/Skilled Nursing/Social Work/Speech Therapy: nursing orders Frequency: UA and a C and S - one time only

## 2019-02-07 NOTE — Telephone Encounter (Signed)
I have informed Tonya with [facility].  I have left a message for wife to call back.

## 2019-02-07 NOTE — Telephone Encounter (Signed)
Wife has called back in regard.  Would like UA to be completed today.  Wife can be contacted at [phone].

## 2019-02-07 NOTE — Progress Notes (Signed)
Virtual Visit via Video Note  I connected with [name] [name] on 02/07/19 at  1:40 PM EDT by a video enabled telemedicine application and verified that I am speaking with the correct person using two identifiers.  The patient and the provider were at separate locations throughout the entire encounter.   I discussed the limitations of evaluation and management by telemedicine and the availability of in person appointments. The patient expressed understanding and agreed to proceed.  History of Present Illness: The patient is a 68 y.o. man with visit for altered mental status. Recent hip replacement surgery back in March. Is still getting home health and this is delayed given coronavirus. Started 3 days ago with some intermittent confusion. Now is persistent and cannot recall family members. Is barely eating much and drinking some. Still urinating into depends. Denies fevers or chills. Has been not moving around as much either. Wife is still giving him his parkinson's medications. He did have an episode of altered mental status after the surgery as well. Denies cough or SOB. Overall it is worsening.  Observations/Objective: Appearance: sitting on cough half-upright, breathing appears normal, casual grooming, abdomen does not appear distended, throat normal, memory altered, mental status is altered, not responsive to direct questions  Assessment and Plan: See problem oriented charting  Follow Up Instructions: ordered u/a and culture as well as CBC and CMP through home health company to fax results to our attention  I discussed the assessment and treatment plan with the patient. The patient was provided an opportunity to ask questions and all were answered. The patient agreed with the plan and demonstrated an understanding of the instructions.   The patient was advised to call back or seek an in-person evaluation if the symptoms worsen or if the condition fails to improve as anticipated.  [name], MD

## 2019-02-07 NOTE — Telephone Encounter (Signed)
Spoke with [place] today and verbal given for U/A and culture, CBC w/diff and CMP. Fax number provided for results to be sent. Kenney Houseman stated typically they can get them back within 48 hours.

## 2019-02-07 NOTE — Telephone Encounter (Signed)
Did visit with patient and wife today. Can we call [facility] and have them do U/A and culture, draw CBC with diff and CMP and fax results to [place].

## 2019-02-07 NOTE — Telephone Encounter (Signed)
See other phone note from today.

## 2019-02-07 NOTE — Telephone Encounter (Signed)
Patient has been scheduled for virtual visit today. 

## 2019-02-07 NOTE — Telephone Encounter (Signed)
Also spoke with Melissa today and info given regarding the orders and that I would contact patient to do VV today and address his UA symptoms and home health orders.

## 2019-02-07 NOTE — Telephone Encounter (Signed)
Copied from [place] [id number]. Topic: Quick Communication - Home Health Verbal Orders >> Feb 07, 2019 12:35 PM Gustavus Messing wrote: Caller/Agency: Wife Callback Number: [phone]  [facility] is waiting for Dr. Nathanial Millman approval for someone to come in and get a urine specimen from the patient

## 2019-02-07 NOTE — Telephone Encounter (Signed)
Notified pt wife --to call PCP to check for possible UTI

## 2019-02-07 NOTE — Assessment & Plan Note (Signed)
I have recommended ER visit so help speed diagnosis and treatment but wife declines as they had a poor experience with covid-19 and recent hospital stay. Ordered U/A and culture and CBC/CMP through home health for results faxed to [place]. Treat as appropriate. He does not have symptoms of covid-19 at this time. Checking for UTI. No signs of pneumonia but if no etiology found will likely need urgent care or ER for further evaluation.

## 2019-02-08 ENCOUNTER — Telehealth: Payer: Self-pay

## 2019-02-08 DIAGNOSIS — G20 Parkinson's disease: Secondary | ICD-10-CM | POA: Diagnosis not present

## 2019-02-08 DIAGNOSIS — I872 Venous insufficiency (chronic) (peripheral): Secondary | ICD-10-CM | POA: Diagnosis not present

## 2019-02-08 DIAGNOSIS — N3281 Overactive bladder: Secondary | ICD-10-CM | POA: Diagnosis not present

## 2019-02-08 DIAGNOSIS — G4733 Obstructive sleep apnea (adult) (pediatric): Secondary | ICD-10-CM | POA: Diagnosis not present

## 2019-02-08 DIAGNOSIS — F332 Major depressive disorder, recurrent severe without psychotic features: Secondary | ICD-10-CM | POA: Diagnosis not present

## 2019-02-08 DIAGNOSIS — S72002D Fracture of unspecified part of neck of left femur, subsequent encounter for closed fracture with routine healing: Secondary | ICD-10-CM | POA: Diagnosis not present

## 2019-02-08 LAB — URINALYSIS W MICROSCOPIC (NOT AT ARMC)
Bilirubin Urine: NEGATIVE
Blood, UA: NEGATIVE
Ketones, urine: NEGATIVE
Leukocytes, UA: NEGATIVE — AB
Nitrite, UA: NEGATIVE
Protein, Urine: NEGATIVE
Specific Gravity, Urine: 1.014
Urine Glucose: NEGATIVE
Urobilinogen, UA: NORMAL
pH, Urine: 5

## 2019-02-08 LAB — BASIC METABOLIC PANEL
BUN: 19 (ref 4–21)
Creatinine: 1.1 (ref 0.6–1.3)
Glucose: 75
Potassium: 4.1 (ref 3.4–5.3)
Sodium: 139 (ref 137–147)

## 2019-02-08 LAB — HEPATIC FUNCTION PANEL
ALT: 3 — AB (ref 10–40)
AST: 30 (ref 14–40)
Alkaline Phosphatase: 84 (ref 25–125)
Bilirubin, Total: 0.7

## 2019-02-08 LAB — CBC AND DIFFERENTIAL
HCT: 39 — AB (ref 41–53)
Hemoglobin: 13.1 — AB (ref 13.5–17.5)
Neutrophils Absolute: 4
Platelets: 284 (ref 150–399)
WBC: 5.6

## 2019-02-08 NOTE — Telephone Encounter (Signed)
tried calling patient a couple times and each time it sounded like someone picked up but there was not one there. Routing to [facility] in case they call back.   We have not received the lab results back from [facility] and they stated that it could take up to 48 hours for the results to come back. Dr. Sharlet Salina states that if patient stops eating, drinking, using the restroom patient should go to emergency room right away. We should have results back Monday. Hopefully

## 2019-02-11 ENCOUNTER — Ambulatory Visit: Payer: Medicare Other | Admitting: Psychology

## 2019-02-11 ENCOUNTER — Encounter: Payer: Self-pay | Admitting: Internal Medicine

## 2019-02-11 ENCOUNTER — Telehealth: Payer: Self-pay | Admitting: Internal Medicine

## 2019-02-11 ENCOUNTER — Telehealth: Payer: Self-pay | Admitting: Neurology

## 2019-02-11 DIAGNOSIS — G20 Parkinson's disease: Secondary | ICD-10-CM | POA: Diagnosis not present

## 2019-02-11 DIAGNOSIS — N3281 Overactive bladder: Secondary | ICD-10-CM | POA: Diagnosis not present

## 2019-02-11 DIAGNOSIS — F332 Major depressive disorder, recurrent severe without psychotic features: Secondary | ICD-10-CM | POA: Diagnosis not present

## 2019-02-11 DIAGNOSIS — I872 Venous insufficiency (chronic) (peripheral): Secondary | ICD-10-CM | POA: Diagnosis not present

## 2019-02-11 DIAGNOSIS — S72002D Fracture of unspecified part of neck of left femur, subsequent encounter for closed fracture with routine healing: Secondary | ICD-10-CM | POA: Diagnosis not present

## 2019-02-11 DIAGNOSIS — G4733 Obstructive sleep apnea (adult) (pediatric): Secondary | ICD-10-CM | POA: Diagnosis not present

## 2019-02-11 NOTE — Telephone Encounter (Signed)
Tried calling patient back but it kept picking up but know one was there. Will route to [facility] incase patient calls back

## 2019-02-11 NOTE — Telephone Encounter (Signed)
It would be hard to determine the cause due to the constraints of the visit.

## 2019-02-11 NOTE — Telephone Encounter (Signed)
They are calling the wrong physician for answers.  I haven't seen him since 4/3.  They saw other physicians about the mental status change (PCP) but not me.  Will need to ask the physicians that they saw for this for an opinion.

## 2019-02-11 NOTE — Telephone Encounter (Signed)
While PD can cause hallucinations, it doesn't cause acute hallucinations in a nondemented patient.  He has had multiple neuropsych tests showing no dementia.  He just had surgery.  I don't know the cause of his symptoms.

## 2019-02-11 NOTE — Progress Notes (Signed)
Abstracted and sent to scan  

## 2019-02-11 NOTE — Telephone Encounter (Signed)
Wife is calling in with results from blood and urine testing,. She wants to know why the incident happened if all the results were negative and how to prevent it from happening again. Please call her back at [phone]. Thanks!

## 2019-02-11 NOTE — Telephone Encounter (Signed)
Wife states patient is doing better is going to the bathroom and patient is not as confused. wife is wanting to know if the blood work came back fine what caused his problems in the first place. Wife seems really worried because she has not seen him like this before and is just hoping there is some sort of explanation.  States that they will not go to the ER unless it is a critical emergency  Patient wanted me to let you know that they have the utmost respect for you and really appreciate all your advice that has been given and ar glad that you continue to give so much support.

## 2019-02-11 NOTE — Telephone Encounter (Signed)
Patient's wife is insisting that these changes are due to his Parkinsons.  Dr. Sharlet Salina did the lab work and said that everything came back ok.  He was hallucinating so much last week and she said that he is doing much better this week.  She wants to know if this is going to happen again.  Please advise.

## 2019-02-11 NOTE — Telephone Encounter (Signed)
Please call and let them know that the labs are normal including close to normal blood counts, kidneys and liver normal. The urine was without any signs of infection. If he is still confused and having problems I would recommend ER evaluation. Please abstract labs into system.

## 2019-02-12 ENCOUNTER — Ambulatory Visit (INDEPENDENT_AMBULATORY_CARE_PROVIDER_SITE_OTHER): Payer: Medicare Other | Admitting: Psychology

## 2019-02-12 DIAGNOSIS — F4323 Adjustment disorder with mixed anxiety and depressed mood: Secondary | ICD-10-CM | POA: Diagnosis not present

## 2019-02-12 DIAGNOSIS — S72002D Fracture of unspecified part of neck of left femur, subsequent encounter for closed fracture with routine healing: Secondary | ICD-10-CM | POA: Diagnosis not present

## 2019-02-12 DIAGNOSIS — F332 Major depressive disorder, recurrent severe without psychotic features: Secondary | ICD-10-CM | POA: Diagnosis not present

## 2019-02-12 DIAGNOSIS — I872 Venous insufficiency (chronic) (peripheral): Secondary | ICD-10-CM | POA: Diagnosis not present

## 2019-02-12 DIAGNOSIS — G4733 Obstructive sleep apnea (adult) (pediatric): Secondary | ICD-10-CM | POA: Diagnosis not present

## 2019-02-12 DIAGNOSIS — G20 Parkinson's disease: Secondary | ICD-10-CM | POA: Diagnosis not present

## 2019-02-12 DIAGNOSIS — F4311 Post-traumatic stress disorder, acute: Secondary | ICD-10-CM | POA: Diagnosis not present

## 2019-02-12 DIAGNOSIS — N3281 Overactive bladder: Secondary | ICD-10-CM | POA: Diagnosis not present

## 2019-02-12 NOTE — Telephone Encounter (Signed)
Patient's wife given information per Dr. Carles Collet.

## 2019-02-13 DIAGNOSIS — G20 Parkinson's disease: Secondary | ICD-10-CM | POA: Diagnosis not present

## 2019-02-13 DIAGNOSIS — F332 Major depressive disorder, recurrent severe without psychotic features: Secondary | ICD-10-CM | POA: Diagnosis not present

## 2019-02-13 DIAGNOSIS — N3281 Overactive bladder: Secondary | ICD-10-CM | POA: Diagnosis not present

## 2019-02-13 DIAGNOSIS — G4733 Obstructive sleep apnea (adult) (pediatric): Secondary | ICD-10-CM | POA: Diagnosis not present

## 2019-02-13 DIAGNOSIS — S72002D Fracture of unspecified part of neck of left femur, subsequent encounter for closed fracture with routine healing: Secondary | ICD-10-CM | POA: Diagnosis not present

## 2019-02-13 DIAGNOSIS — I872 Venous insufficiency (chronic) (peripheral): Secondary | ICD-10-CM | POA: Diagnosis not present

## 2019-02-14 DIAGNOSIS — N3281 Overactive bladder: Secondary | ICD-10-CM | POA: Diagnosis not present

## 2019-02-14 DIAGNOSIS — G4733 Obstructive sleep apnea (adult) (pediatric): Secondary | ICD-10-CM | POA: Diagnosis not present

## 2019-02-14 DIAGNOSIS — F332 Major depressive disorder, recurrent severe without psychotic features: Secondary | ICD-10-CM | POA: Diagnosis not present

## 2019-02-14 DIAGNOSIS — S72002D Fracture of unspecified part of neck of left femur, subsequent encounter for closed fracture with routine healing: Secondary | ICD-10-CM | POA: Diagnosis not present

## 2019-02-14 DIAGNOSIS — I872 Venous insufficiency (chronic) (peripheral): Secondary | ICD-10-CM | POA: Diagnosis not present

## 2019-02-14 DIAGNOSIS — G20 Parkinson's disease: Secondary | ICD-10-CM | POA: Diagnosis not present

## 2019-02-15 ENCOUNTER — Other Ambulatory Visit: Payer: Self-pay | Admitting: Physician Assistant

## 2019-02-15 ENCOUNTER — Telehealth: Payer: Self-pay | Admitting: Orthopaedic Surgery

## 2019-02-15 DIAGNOSIS — I872 Venous insufficiency (chronic) (peripheral): Secondary | ICD-10-CM | POA: Diagnosis not present

## 2019-02-15 DIAGNOSIS — G4733 Obstructive sleep apnea (adult) (pediatric): Secondary | ICD-10-CM | POA: Diagnosis not present

## 2019-02-15 DIAGNOSIS — N3281 Overactive bladder: Secondary | ICD-10-CM | POA: Diagnosis not present

## 2019-02-15 DIAGNOSIS — F332 Major depressive disorder, recurrent severe without psychotic features: Secondary | ICD-10-CM | POA: Diagnosis not present

## 2019-02-15 DIAGNOSIS — G20 Parkinson's disease: Secondary | ICD-10-CM | POA: Diagnosis not present

## 2019-02-15 DIAGNOSIS — S72002D Fracture of unspecified part of neck of left femur, subsequent encounter for closed fracture with routine healing: Secondary | ICD-10-CM | POA: Diagnosis not present

## 2019-02-15 MED ORDER — AMOXICILLIN 500 MG PO CAPS: ORAL_CAPSULE | ORAL | 1 refills | Status: DC

## 2019-02-15 NOTE — Telephone Encounter (Signed)
I just sent in

## 2019-02-15 NOTE — Telephone Encounter (Signed)
IC advised. Sent.

## 2019-02-15 NOTE — Telephone Encounter (Signed)
pls advise. Thanks.  

## 2019-02-15 NOTE — Telephone Encounter (Signed)
Patients wife called in saying patient has an dentist appt 2 weeks from today. Says Dr, Erlinda Hong told her that he would give pt some antibiotics if needed. She thinks pt needs it before the appt. Says she will give to him if he makes it to the appt and if he doesn't go she will not give it to him. She would like if someone would give her a call.

## 2019-02-18 DIAGNOSIS — N3281 Overactive bladder: Secondary | ICD-10-CM | POA: Diagnosis not present

## 2019-02-18 DIAGNOSIS — G20 Parkinson's disease: Secondary | ICD-10-CM | POA: Diagnosis not present

## 2019-02-18 DIAGNOSIS — I872 Venous insufficiency (chronic) (peripheral): Secondary | ICD-10-CM | POA: Diagnosis not present

## 2019-02-18 DIAGNOSIS — F332 Major depressive disorder, recurrent severe without psychotic features: Secondary | ICD-10-CM | POA: Diagnosis not present

## 2019-02-18 DIAGNOSIS — S72002D Fracture of unspecified part of neck of left femur, subsequent encounter for closed fracture with routine healing: Secondary | ICD-10-CM | POA: Diagnosis not present

## 2019-02-18 DIAGNOSIS — G4733 Obstructive sleep apnea (adult) (pediatric): Secondary | ICD-10-CM | POA: Diagnosis not present

## 2019-02-19 DIAGNOSIS — S72002D Fracture of unspecified part of neck of left femur, subsequent encounter for closed fracture with routine healing: Secondary | ICD-10-CM | POA: Diagnosis not present

## 2019-02-19 DIAGNOSIS — I872 Venous insufficiency (chronic) (peripheral): Secondary | ICD-10-CM | POA: Diagnosis not present

## 2019-02-19 DIAGNOSIS — G4733 Obstructive sleep apnea (adult) (pediatric): Secondary | ICD-10-CM | POA: Diagnosis not present

## 2019-02-19 DIAGNOSIS — F332 Major depressive disorder, recurrent severe without psychotic features: Secondary | ICD-10-CM | POA: Diagnosis not present

## 2019-02-19 DIAGNOSIS — N3281 Overactive bladder: Secondary | ICD-10-CM | POA: Diagnosis not present

## 2019-02-19 DIAGNOSIS — G20 Parkinson's disease: Secondary | ICD-10-CM | POA: Diagnosis not present

## 2019-02-20 ENCOUNTER — Other Ambulatory Visit (HOSPITAL_COMMUNITY): Payer: Self-pay | Admitting: Psychiatry

## 2019-02-20 DIAGNOSIS — S72002D Fracture of unspecified part of neck of left femur, subsequent encounter for closed fracture with routine healing: Secondary | ICD-10-CM | POA: Diagnosis not present

## 2019-02-20 DIAGNOSIS — F332 Major depressive disorder, recurrent severe without psychotic features: Secondary | ICD-10-CM

## 2019-02-20 DIAGNOSIS — F411 Generalized anxiety disorder: Secondary | ICD-10-CM

## 2019-02-20 DIAGNOSIS — N3281 Overactive bladder: Secondary | ICD-10-CM | POA: Diagnosis not present

## 2019-02-20 DIAGNOSIS — G4733 Obstructive sleep apnea (adult) (pediatric): Secondary | ICD-10-CM | POA: Diagnosis not present

## 2019-02-20 DIAGNOSIS — G20 Parkinson's disease: Secondary | ICD-10-CM | POA: Diagnosis not present

## 2019-02-20 DIAGNOSIS — I872 Venous insufficiency (chronic) (peripheral): Secondary | ICD-10-CM | POA: Diagnosis not present

## 2019-02-21 ENCOUNTER — Telehealth: Payer: Self-pay

## 2019-02-21 ENCOUNTER — Other Ambulatory Visit (HOSPITAL_COMMUNITY): Payer: Self-pay | Admitting: Psychiatry

## 2019-02-21 DIAGNOSIS — F332 Major depressive disorder, recurrent severe without psychotic features: Secondary | ICD-10-CM

## 2019-02-21 NOTE — Telephone Encounter (Signed)
Vaughan Basta saw patient yesterday and wife was worried about the patient having more tremors and hallucinations. Vaughan Basta noticed the increase in tremors but has not noticed any hallucinations. Wanted to make patients PCP aware of wife's concerns. Vaughan Basta also states patient was having issues with incontinence yesterday and that was new for patient

## 2019-02-21 NOTE — Telephone Encounter (Signed)
I would advise them to call neurology and see if they can do a virtual visit with a provider there as soon as able. If not within 3-4 days can do virtual visit with [place].

## 2019-02-21 NOTE — Telephone Encounter (Signed)
Without an actual visit with a provider we cannot get any answers to any of her questions. I am not requesting that they call neurology for information, just to schedule a visit. The tremors (depending on what they are since I am not able to diagnose them from these limited notes) may be related more to his parkinson's so they are more likely to get answers from a visit with neurology. If they refuse this they can schedule visit with me. If unwilling to schedule visit with either I cannot give them any more information as I have not treated him for this.

## 2019-02-21 NOTE — Telephone Encounter (Signed)
Contacted pt spouse Joycelyn Schmid) and spouse has agreed to do a virtual visit with PCP to evaluate the hallucinations (auditory and visual). Spouse has agreed to visit via doxy (9am on Friday 02/22/2019).

## 2019-02-21 NOTE — Telephone Encounter (Signed)
Would need information from wife about symptoms as this is not clear. Is he having hallucinations? Visual or audio or both?

## 2019-02-21 NOTE — Telephone Encounter (Signed)
The tremors are becoming more frequent and during the episodes he hallucinating states it is a visual and audio and also will have accidents during the episodes. During the hallucinations he starts to move his hands and talking and picking up things that are not there. Example for this morning he saw bugs on his walker but they were not there. Patient wife is very addiment that she will not take patient to the hospital unless it is an emergency (if he hurts himself). Wants me to make it very clear that she will not take him there.

## 2019-02-21 NOTE — Telephone Encounter (Signed)
Copied from [place] [id number]. Topic: General - Other >> Feb 20, 2019  1:53 PM Burchel, Abbi R wrote: Reason for CRM:   Vaughan Basta ([facility] [phone]) requesting call back from Dr Nathanial Millman assistant.

## 2019-02-21 NOTE — Telephone Encounter (Signed)
Patients wife seems to be getting upset because Dr. Carles Collet is telling patient to see Dr. Sharlet Salina and vise versa. States that she feels like a ping pong ball between the two offices and would like for the two doctors to talk to each other about patient so they can come to a decision on what needs to be done. Wife is over all this back and forth and patient is currently going through another episode right now and cannot deal with all these appointments just to be ping pong'd back to the other doctor. Patient wife is pleading that both doctors get in touch with each other.   Dr. Carles Collet phone number [phone]

## 2019-02-22 ENCOUNTER — Encounter: Payer: Self-pay | Admitting: Internal Medicine

## 2019-02-22 ENCOUNTER — Telehealth: Payer: Self-pay

## 2019-02-22 ENCOUNTER — Ambulatory Visit (INDEPENDENT_AMBULATORY_CARE_PROVIDER_SITE_OTHER): Payer: Medicare Other | Admitting: Internal Medicine

## 2019-02-22 ENCOUNTER — Telehealth: Payer: Self-pay | Admitting: Orthopaedic Surgery

## 2019-02-22 DIAGNOSIS — I872 Venous insufficiency (chronic) (peripheral): Secondary | ICD-10-CM | POA: Diagnosis not present

## 2019-02-22 DIAGNOSIS — R443 Hallucinations, unspecified: Secondary | ICD-10-CM | POA: Diagnosis not present

## 2019-02-22 DIAGNOSIS — S72002D Fracture of unspecified part of neck of left femur, subsequent encounter for closed fracture with routine healing: Secondary | ICD-10-CM | POA: Diagnosis not present

## 2019-02-22 DIAGNOSIS — G4733 Obstructive sleep apnea (adult) (pediatric): Secondary | ICD-10-CM | POA: Diagnosis not present

## 2019-02-22 DIAGNOSIS — R410 Disorientation, unspecified: Secondary | ICD-10-CM

## 2019-02-22 DIAGNOSIS — F332 Major depressive disorder, recurrent severe without psychotic features: Secondary | ICD-10-CM | POA: Diagnosis not present

## 2019-02-22 DIAGNOSIS — N3281 Overactive bladder: Secondary | ICD-10-CM | POA: Diagnosis not present

## 2019-02-22 DIAGNOSIS — G20 Parkinson's disease: Secondary | ICD-10-CM | POA: Diagnosis not present

## 2019-02-22 NOTE — Assessment & Plan Note (Signed)
Etiology is uncertain at this time. I have recommended EMS to transport patient to ER for urgent evaluation but the wife is not agreeing with this. She will try to get patient for CT head today and we talked about how this may not help [place] depending on cause. They understand that this could be a serious condition which could cause death if undiagnosed and they still do not agree to proceed to ER evaluation at this time. Depending on CT results and ability of patient to come for labs we will likely need more testing.

## 2019-02-22 NOTE — Telephone Encounter (Signed)
CALLED [facility] BACK TO ADVISE.

## 2019-02-22 NOTE — Assessment & Plan Note (Signed)
The etiology of this remains broad. Given that she is unable to get the patient anywhere we cannot really assess condition. It could be medication related, subclinical seizure, infection with hip replacement, worsening parkinson's, another etiology in the brain, stroke, infection such as pneumonia or UTI.

## 2019-02-22 NOTE — Telephone Encounter (Signed)
Patient's wife called regarding his Wellbutrin 150mg  and Wellbutrin 300mg . Patient has appointment scheduled for 03/04/19. Patient's wife was thinking he needed a refill before then, however, doctor looked at medications and stated patient should have enough until appointment on 6/1. I called patient's wife to inform her of that and she stated she is fine with waiting until patient's appointment for refills. I told her that if for any reason she finds patient running a little short before his appointment to let us know. Wife's main concern was patient's appointment. She stated that she definitely needs a televisit due to her inability to physically get him to the office for an appointment. Thank you.

## 2019-02-22 NOTE — Progress Notes (Signed)
Virtual Visit via Video Note  I connected with [name] [name] on 02/22/19 at  9:00 AM EDT by a video enabled telemedicine application and verified that I am speaking with the correct person using two identifiers.  The patient and the provider were at separate locations throughout the entire encounter.   I discussed the limitations of evaluation and management by telemedicine and the availability of in person appointments. The patient expressed understanding and agreed to proceed.  History of Present Illness: The patient is a 68 y.o. man with visit for new auditory and visual hallucinations. His wife is present and gives most of the history. He did have fall/hip fracture and replacement several months ago. Stayed in the hospital and rehab and this was a very traumatic experience due to covid-19 restrictions. His wife is very reluctant to take him anywhere as a result. Started 4-5 days ago with head to toe "tremors". He has been shaking head to toe since that time, sometimes worse and others better. He does have parkinson's and this is worse with more stiffness in the same time frame. She denies he is having any fevers, chills, SOB, cough, chest pains, problems with pain or swelling or redness at surgical site, diarrhea, constipation (outside usual). No change in medications or doses recently. Had an episode where he was altered back at the beginning of May and the etiology of this episode was not found. Labs were run and normal as well as U/A at that time. Denies falls in the last 3 weeks. The auditory hallucinations she cannot describe and he is not able to describe during our visit either. The visual he is seeing things on the walker or other places. It is unclear if he is aware these are not there or not. The tremors he is unsure if he can stop if he tries. She is feeding him as he is not able to eat without spilling. Denies headaches. She denies that he is not moving any arm or leg in the last week.  Overall it is stable and present 24/7 rather than coming and going throughout the day. Strength is very poor and she is not able to get him upstairs. He has been incontinent the last 4-5 days as well and wearing depends. She is not sure if he does not know he has to go or is unable to tell or just unable to get to the bathroom in time. He has sometimes told her in advance when he has to go and sometimes can get to the bathroom. Has tried nothing.   Observations/Objective: Appearance: not alert, breathing appears normal, no coughing during visit, no drooping face although some masked facies stable from prior, tremors in the arms diffuse, not regular, casual grooming, abdomen does not appear distended, not tender to palpation from wife during visit, throat not examined, memory poor and unable to assess due to lack of cooperation, mental status is sleepy and arousable  Assessment and Plan: See problem oriented charting  Follow Up Instructions: recommendation to call EMS to take him to ER for assessment, they do refuse even after extensive counseling, ordered CT head stat and they want to wait until Tuesday to take him and extensive counseling and wife agrees to try to take him for this today  Visit time 40 minutes: greater than 50% of that time was spent in non face to face counseling and coordination of care with the patient: counseled about the serious nature of his condition, the lack of information I  have about the cause, the need to urgently find the cause and remedy this, potential etiology of condition   I discussed the assessment and treatment plan with the patient. The patient was provided an opportunity to ask questions and all were answered. The patient agreed with the plan and demonstrated an understanding of the instructions.   The patient was advised to call back or seek an in-person evaluation if the symptoms worsen or if the condition fails to improve as anticipated.  [name], MD

## 2019-02-22 NOTE — Telephone Encounter (Signed)
Noted  

## 2019-02-22 NOTE — Telephone Encounter (Signed)
Received call from [facility] with [facility] at [facility] needing verbal order to recert patient next week. The number to contact [facility] is [phone]

## 2019-02-26 ENCOUNTER — Telehealth: Payer: Self-pay

## 2019-02-26 DIAGNOSIS — S72002D Fracture of unspecified part of neck of left femur, subsequent encounter for closed fracture with routine healing: Secondary | ICD-10-CM | POA: Diagnosis not present

## 2019-02-26 DIAGNOSIS — I872 Venous insufficiency (chronic) (peripheral): Secondary | ICD-10-CM | POA: Diagnosis not present

## 2019-02-26 DIAGNOSIS — N3281 Overactive bladder: Secondary | ICD-10-CM | POA: Diagnosis not present

## 2019-02-26 DIAGNOSIS — G20 Parkinson's disease: Secondary | ICD-10-CM | POA: Diagnosis not present

## 2019-02-26 DIAGNOSIS — G4733 Obstructive sleep apnea (adult) (pediatric): Secondary | ICD-10-CM | POA: Diagnosis not present

## 2019-02-26 DIAGNOSIS — F332 Major depressive disorder, recurrent severe without psychotic features: Secondary | ICD-10-CM | POA: Diagnosis not present

## 2019-02-26 NOTE — Telephone Encounter (Signed)
[facility] called needing Verbal orders to extend PT order. Approved orders.

## 2019-02-27 ENCOUNTER — Ambulatory Visit (INDEPENDENT_AMBULATORY_CARE_PROVIDER_SITE_OTHER): Payer: Medicare Other | Admitting: Psychology

## 2019-02-27 DIAGNOSIS — S72002D Fracture of unspecified part of neck of left femur, subsequent encounter for closed fracture with routine healing: Secondary | ICD-10-CM | POA: Diagnosis not present

## 2019-02-27 DIAGNOSIS — G4733 Obstructive sleep apnea (adult) (pediatric): Secondary | ICD-10-CM | POA: Diagnosis not present

## 2019-02-27 DIAGNOSIS — F4323 Adjustment disorder with mixed anxiety and depressed mood: Secondary | ICD-10-CM | POA: Diagnosis not present

## 2019-02-27 DIAGNOSIS — G20 Parkinson's disease: Secondary | ICD-10-CM | POA: Diagnosis not present

## 2019-02-27 DIAGNOSIS — F332 Major depressive disorder, recurrent severe without psychotic features: Secondary | ICD-10-CM | POA: Diagnosis not present

## 2019-02-27 DIAGNOSIS — N3281 Overactive bladder: Secondary | ICD-10-CM | POA: Diagnosis not present

## 2019-02-27 DIAGNOSIS — I872 Venous insufficiency (chronic) (peripheral): Secondary | ICD-10-CM | POA: Diagnosis not present

## 2019-02-28 ENCOUNTER — Telehealth (HOSPITAL_COMMUNITY): Payer: Self-pay | Admitting: Psychiatry

## 2019-02-28 ENCOUNTER — Other Ambulatory Visit: Payer: Self-pay

## 2019-02-28 ENCOUNTER — Other Ambulatory Visit: Payer: Medicare Other

## 2019-02-28 ENCOUNTER — Ambulatory Visit
Admission: RE | Admit: 2019-02-28 | Discharge: 2019-02-28 | Disposition: A | Discharge status: Home / Self Care | Payer: Medicare Other | Source: Ambulatory Visit | Attending: Internal Medicine | Admitting: Internal Medicine

## 2019-02-28 DIAGNOSIS — I872 Venous insufficiency (chronic) (peripheral): Secondary | ICD-10-CM | POA: Diagnosis not present

## 2019-02-28 DIAGNOSIS — G20 Parkinson's disease: Secondary | ICD-10-CM | POA: Diagnosis not present

## 2019-02-28 DIAGNOSIS — S72002D Fracture of unspecified part of neck of left femur, subsequent encounter for closed fracture with routine healing: Secondary | ICD-10-CM | POA: Diagnosis not present

## 2019-02-28 DIAGNOSIS — F332 Major depressive disorder, recurrent severe without psychotic features: Secondary | ICD-10-CM | POA: Diagnosis not present

## 2019-02-28 DIAGNOSIS — N3281 Overactive bladder: Secondary | ICD-10-CM | POA: Diagnosis not present

## 2019-02-28 DIAGNOSIS — R443 Hallucinations, unspecified: Secondary | ICD-10-CM | POA: Diagnosis not present

## 2019-02-28 DIAGNOSIS — R4182 Altered mental status, unspecified: Secondary | ICD-10-CM | POA: Diagnosis not present

## 2019-02-28 DIAGNOSIS — Z96642 Presence of left artificial hip joint: Secondary | ICD-10-CM | POA: Diagnosis not present

## 2019-02-28 DIAGNOSIS — G4733 Obstructive sleep apnea (adult) (pediatric): Secondary | ICD-10-CM | POA: Diagnosis not present

## 2019-02-28 DIAGNOSIS — Z9181 History of falling: Secondary | ICD-10-CM | POA: Diagnosis not present

## 2019-03-04 ENCOUNTER — Other Ambulatory Visit: Payer: Self-pay

## 2019-03-04 ENCOUNTER — Encounter (HOSPITAL_COMMUNITY): Payer: Self-pay | Admitting: Psychiatry

## 2019-03-04 ENCOUNTER — Ambulatory Visit (INDEPENDENT_AMBULATORY_CARE_PROVIDER_SITE_OTHER): Payer: Medicare Other | Admitting: Psychiatry

## 2019-03-04 DIAGNOSIS — G20 Parkinson's disease: Secondary | ICD-10-CM | POA: Diagnosis not present

## 2019-03-04 DIAGNOSIS — F332 Major depressive disorder, recurrent severe without psychotic features: Secondary | ICD-10-CM

## 2019-03-04 DIAGNOSIS — G4733 Obstructive sleep apnea (adult) (pediatric): Secondary | ICD-10-CM | POA: Diagnosis not present

## 2019-03-04 DIAGNOSIS — N3281 Overactive bladder: Secondary | ICD-10-CM | POA: Diagnosis not present

## 2019-03-04 DIAGNOSIS — F411 Generalized anxiety disorder: Secondary | ICD-10-CM | POA: Diagnosis not present

## 2019-03-04 DIAGNOSIS — I872 Venous insufficiency (chronic) (peripheral): Secondary | ICD-10-CM | POA: Diagnosis not present

## 2019-03-04 DIAGNOSIS — S72002D Fracture of unspecified part of neck of left femur, subsequent encounter for closed fracture with routine healing: Secondary | ICD-10-CM | POA: Diagnosis not present

## 2019-03-04 MED ORDER — BUPROPION HCL ER (XL) 300 MG PO TB24: 300.0000 mg | ORAL_TABLET | ORAL | 0 refills | Status: DC

## 2019-03-04 MED ORDER — BUPROPION HCL ER (XL) 150 MG PO TB24: 150.0000 mg | ORAL_TABLET | Freq: Every day | ORAL | 0 refills | Status: DC

## 2019-03-04 MED ORDER — CITALOPRAM HYDROBROMIDE 40 MG PO TABS: 40.0000 mg | ORAL_TABLET | Freq: Every day | ORAL | 0 refills | Status: DC

## 2019-03-04 NOTE — Progress Notes (Signed)
Virtual Visit via Telephone Note  I connected with [name] [name] on 03/04/19 at  2:00 PM EDT by telephone and verified that I am speaking with the correct person using two identifiers.   I discussed the limitations, risks, security and privacy concerns of performing an evaluation and management service by telephone and the availability of in person appointments. I also discussed with the patient that there may be a patient responsible charge related to this service. The patient expressed understanding and agreed to proceed.   History of Present Illness: Patient was evaluated through phone session.  Most of the information was obtained through his wife who is actively involved in his treatment plan.  Patient was admitted in March due to fall and broke his left leg.  He went through surgery and after that he had a rehab.  His wife is concerned because medicines were changed and he was not getting the right dose of Celexa and having increased hallucination, paranoia and change mental status.  During the hospital he was seen by Dr. Sima Matas.  He is doing better since the dose is adjusted and he is taking Celexa 40 mg daily.  He is feeling better.  He still has hallucination but they are not as bad.  He started physical therapy.  Patient reported he has days when he is very low and there are days when he is fine.  He does not want to change his medication.  He is sleeping better.  Denies any agitation, anger or any hallucination.  He is not taking a lot of precaution while he is walking due to history of fall.  He endorses memory was worse when he was in the hospital but now slowly and gradually he is getting back to normal.  He still have difficulty remembering things but he think this is his baseline.  He has tremors due to Parkinson but sometimes he tried to do crossword puzzles to keep his memory intact.  His appetite is okay.  His energy level is fair.  He denies any suicidal thoughts or homicidal  thought.  Like to continue his current medication.  Past Psychiatric History: Reviewed. H/O depression and anxiety.  Noh/o suicidal attempt, inpatient psychiatric treatment, mania, psychosis, paranoia or any hallucination. Tried Effexor, Abilify, Paxil, Cymbalta, Zoloft, amitriptyline, Pristiq, Britnellex, Celexa, Deplin,Vyvanseand Latuda. He is seeingin this office since September 2009. He has seen multiple psychiatrists in the past.     Recent Results (from the past 2160 hour(s))  Basic metabolic panel     Status: Abnormal   Collection Time: 12/08/18  5:07 PM  Result Value Ref Range   Sodium 139 135 - 145 mmol/L   Potassium 3.7 3.5 - 5.1 mmol/L   Chloride 108 98 - 111 mmol/L   CO2 24 22 - 32 mmol/L   Glucose, Bld 93 70 - 99 mg/dL   BUN 17 8 - 23 mg/dL   Creatinine, Ser 1.13 0.61 - 1.24 mg/dL   Calcium 8.6 (L) 8.9 - 10.3 mg/dL   GFR calc non Af Amer >60 >60 mL/min   GFR calc Af Amer >60 >60 mL/min   Anion gap 7 5 - 15    Comment: Performed at [facility], [place] [address], [place], [place] [address]  CBC WITH DIFFERENTIAL     Status: Abnormal   Collection Time: 12/08/18  5:07 PM  Result Value Ref Range   WBC 10.5 4.0 - 10.5 K/uL   RBC 4.15 (L) 4.22 - 5.81 MIL/uL  Hemoglobin 12.9 (L) 13.0 - 17.0 g/dL   HCT 40.0 39.0 - 52.0 %   MCV 96.4 80.0 - 100.0 fL   MCH 31.1 26.0 - 34.0 pg   MCHC 32.3 30.0 - 36.0 g/dL   RDW 12.9 11.5 - 15.5 %   Platelets 262 150 - 400 K/uL   nRBC 0.0 0.0 - 0.2 %   Neutrophils Relative % 83 %   Neutro Abs 8.7 (H) 1.7 - 7.7 K/uL   Lymphocytes Relative 10 %   Lymphs Abs 1.1 0.7 - 4.0 K/uL   Monocytes Relative 5 %   Monocytes Absolute 0.6 0.1 - 1.0 K/uL   Eosinophils Relative 1 %   Eosinophils Absolute 0.1 0.0 - 0.5 K/uL   Basophils Relative 0 %   Basophils Absolute 0.0 0.0 - 0.1 K/uL   Immature Granulocytes 1 %   Abs Immature Granulocytes 0.05 0.00 - 0.07 K/uL    Comment: Performed at [facility], [place]  [address], [place], [place] [address]  Protime-INR     Status: None   Collection Time: 12/08/18  5:07 PM  Result Value Ref Range   Prothrombin Time 14.2 11.4 - 15.2 seconds   INR 1.1 0.8 - 1.2    Comment: (NOTE) INR goal varies based on device and disease states. Performed at [facility], [place] [address] [place], [place] [address]   Type and screen [place]     Status: None   Collection Time: 12/08/18  5:09 PM  Result Value Ref Range   ABO/RH(D) O POS    Antibody Screen NEG    Sample Expiration      12/11/2018 Performed at [facility], [place] [address] [place], [place] [address]   ABO/Rh     Status: None   Collection Time: 12/08/18  5:56 PM  Result Value Ref Range   ABO/RH(D)      O POS Performed at [facility], [place] [address], [place], [place] [address]   Surgical pcr screen     Status: None   Collection Time: 12/08/18 11:21 PM  Result Value Ref Range   MRSA, PCR NEGATIVE NEGATIVE   Staphylococcus aureus NEGATIVE NEGATIVE    Comment: (NOTE) The Xpert SA Assay (FDA approved for NASAL specimens in patients 24 years of age and older), is one component of a comprehensive surveillance program. It is not intended to diagnose infection nor to guide or monitor treatment. Performed at [facility], [place] [address], [place], [place] [address]   Type and screen [place]     Status: None   Collection Time: 12/09/18 12:17 PM  Result Value Ref Range   ABO/RH(D) O POS    Antibody Screen NEG    Sample Expiration      12/12/2018 Performed at [facility], [place] [address] [place],  [address]   CBC     Status: Abnormal   Collection Time: 12/09/18 12:17 PM  Result Value Ref Range   WBC 14.0 (H) 4.0 - 10.5 K/uL   RBC 3.73 (L) 4.22 - 5.81 MIL/uL   Hemoglobin 11.5 (L) 13.0 - 17.0 g/dL   HCT 34.8 (L) 39.0 - 52.0 %   MCV 93.3 80.0 - 100.0 fL   MCH 30.8 26.0 - 34.0 pg    MCHC 33.0 30.0 - 36.0 g/dL   RDW 12.7 11.5 - 15.5 %   Platelets 229 150 - 400 K/uL   nRBC  0.0 0.0 - 0.2 %    Comment: Performed at [facility], [place] [address] [place], [place] [address]  Creatinine, serum     Status: None   Collection Time: 12/09/18 12:17 PM  Result Value Ref Range   Creatinine, Ser 1.18 0.61 - 1.24 mg/dL   GFR calc non Af Amer >60 >60 mL/min   GFR calc Af Amer >60 >60 mL/min    Comment: Performed at [place] [address] [place], [place] [address]  ABO/Rh     Status: None   Collection Time: 12/09/18 12:20 PM  Result Value Ref Range   ABO/RH(D)      O POS Performed at [place] [address], [place], [place] [address]   CBC     Status: Abnormal   Collection Time: 12/10/18  3:27 AM  Result Value Ref Range   WBC 13.9 (H) 4.0 - 10.5 K/uL   RBC 3.18 (L) 4.22 - 5.81 MIL/uL   Hemoglobin 9.8 (L) 13.0 - 17.0 g/dL   HCT 29.1 (L) 39.0 - 52.0 %   MCV 91.5 80.0 - 100.0 fL   MCH 30.8 26.0 - 34.0 pg   MCHC 33.7 30.0 - 36.0 g/dL   RDW 12.8 11.5 - 15.5 %   Platelets 202 150 - 400 K/uL   nRBC 0.0 0.0 - 0.2 %    Comment: Performed at [facility], [place] [address] [place], [place] [address]  Basic metabolic panel     Status: Abnormal   Collection Time: 12/10/18  3:27 AM  Result Value Ref Range   Sodium 136 135 - 145 mmol/L   Potassium 4.1 3.5 - 5.1 mmol/L   Chloride 106 98 - 111 mmol/L   CO2 23 22 - 32 mmol/L   Glucose, Bld 139 (H) 70 - 99 mg/dL   BUN 15 8 - 23 mg/dL   Creatinine, Ser 1.24 0.61 - 1.24 mg/dL   Calcium 8.2 (L) 8.9 - 10.3 mg/dL   GFR calc non Af Amer >60 >60 mL/min   GFR calc Af Amer >60 >60 mL/min   Anion gap 7 5 - 15    Comment: Performed at [facility], [place] [address], [place], [place] [address]  CBC     Status: Abnormal   Collection Time: 12/11/18  3:13 AM  Result Value Ref Range   WBC 10.5 4.0 - 10.5 K/uL   RBC 3.06 (L) 4.22 - 5.81 MIL/uL   Hemoglobin 9.3 (L) 13.0 - 17.0 g/dL   HCT 27.7 (L) 39.0 -  52.0 %   MCV 90.5 80.0 - 100.0 fL   MCH 30.4 26.0 - 34.0 pg   MCHC 33.6 30.0 - 36.0 g/dL   RDW 13.0 11.5 - 15.5 %   Platelets 181 150 - 400 K/uL   nRBC 0.0 0.0 - 0.2 %    Comment: Performed at [facility], [place] [address] [place], [place] [address]  Basic metabolic panel     Status: Abnormal   Collection Time: 12/11/18  3:13 AM  Result Value Ref Range   Sodium 137 135 - 145 mmol/L   Potassium 3.8 3.5 - 5.1 mmol/L   Chloride 105 98 - 111 mmol/L   CO2 23 22 - 32 mmol/L   Glucose, Bld 129 (H) 70 - 99 mg/dL   BUN 19 8 - 23 mg/dL   Creatinine, Ser 1.10 0.61 - 1.24 mg/dL   Calcium 8.5 (L) 8.9 -  10.3 mg/dL   GFR calc non Af Amer >60 >60 mL/min   GFR calc Af Amer >60 >60 mL/min   Anion gap 9 5 - 15    Comment: Performed at [place] [address], [place], [place] [address]  CBC WITH DIFFERENTIAL     Status: Abnormal   Collection Time: 12/12/18  5:07 AM  Result Value Ref Range   WBC 11.7 (H) 4.0 - 10.5 K/uL   RBC 3.12 (L) 4.22 - 5.81 MIL/uL   Hemoglobin 9.8 (L) 13.0 - 17.0 g/dL   HCT 28.0 (L) 39.0 - 52.0 %   MCV 89.7 80.0 - 100.0 fL   MCH 31.4 26.0 - 34.0 pg   MCHC 35.0 30.0 - 36.0 g/dL   RDW 12.7 11.5 - 15.5 %   Platelets 216 150 - 400 K/uL   nRBC 0.0 0.0 - 0.2 %   Neutrophils Relative % 87 %   Neutro Abs 10.1 (H) 1.7 - 7.7 K/uL   Lymphocytes Relative 5 %   Lymphs Abs 0.6 (L) 0.7 - 4.0 K/uL   Monocytes Relative 8 %   Monocytes Absolute 0.9 0.1 - 1.0 K/uL   Eosinophils Relative 0 %   Eosinophils Absolute 0.0 0.0 - 0.5 K/uL   Basophils Relative 0 %   Basophils Absolute 0.0 0.0 - 0.1 K/uL   Immature Granulocytes 0 %   Abs Immature Granulocytes 0.04 0.00 - 0.07 K/uL    Comment: Performed at [facility], [address], [place], [place] [address]  Comprehensive metabolic panel     Status: Abnormal   Collection Time: 12/12/18  5:07 AM  Result Value Ref Range   Sodium 136 135 - 145 mmol/L   Potassium 3.7 3.5 - 5.1 mmol/L   Chloride 107 98 - 111 mmol/L    CO2 20 (L) 22 - 32 mmol/L   Glucose, Bld 146 (H) 70 - 99 mg/dL   BUN 16 8 - 23 mg/dL   Creatinine, Ser 1.07 0.61 - 1.24 mg/dL   Calcium 8.4 (L) 8.9 - 10.3 mg/dL   Total Protein 5.8 (L) 6.5 - 8.1 g/dL   Albumin 2.9 (L) 3.5 - 5.0 g/dL   AST 65 (H) 15 - 41 U/L   ALT 33 0 - 44 U/L   Alkaline Phosphatase 46 38 - 126 U/L   Total Bilirubin 1.3 (H) 0.3 - 1.2 mg/dL   GFR calc non Af Amer >60 >60 mL/min   GFR calc Af Amer >60 >60 mL/min   Anion gap 9 5 - 15    Comment: Performed at [facility], [place] [address], [place], [place] [address]  Urinalysis, Routine w reflex microscopic     Status: Abnormal   Collection Time: 12/13/18 10:04 AM  Result Value Ref Range   Color, Urine AMBER (A) YELLOW    Comment: BIOCHEMICALS MAY BE AFFECTED BY COLOR   APPearance CLEAR CLEAR   Specific Gravity, Urine 1.029 1.005 - 1.030   pH 5.0 5.0 - 8.0   Glucose, UA NEGATIVE NEGATIVE mg/dL   Hgb urine dipstick SMALL (A) NEGATIVE   Bilirubin Urine NEGATIVE NEGATIVE   Ketones, ur 5 (A) NEGATIVE mg/dL   Protein, ur 30 (A) NEGATIVE mg/dL   Nitrite NEGATIVE NEGATIVE   Leukocytes,Ua NEGATIVE NEGATIVE   RBC / HPF 0-5 0 - 5 RBC/hpf   WBC, UA 0-5 0 - 5 WBC/hpf   Bacteria, UA FEW (A) NONE SEEN   Mucus PRESENT  Comment: Performed at [facility], [place] [address], [place], [place] [address]  Ammonia     Status: None   Collection Time: 12/13/18 10:14 AM  Result Value Ref Range   Ammonia 25 9 - 35 umol/L    Comment: Performed at [facility], [place] [address] [place], [place] [address]  Lactic acid, plasma     Status: None   Collection Time: 12/13/18 10:14 AM  Result Value Ref Range   Lactic Acid, Venous 1.8 0.5 - 1.9 mmol/L    Comment: Performed at [place] [address], [place], [place] [address]  CBC     Status: Abnormal   Collection Time: 12/13/18 10:14 AM  Result Value Ref Range   WBC 15.2 (H) 4.0 - 10.5 K/uL   RBC 3.11 (L) 4.22 - 5.81 MIL/uL   Hemoglobin 9.8 (L) 13.0 - 17.0  g/dL   HCT 27.9 (L) 39.0 - 52.0 %   MCV 89.7 80.0 - 100.0 fL   MCH 31.5 26.0 - 34.0 pg   MCHC 35.1 30.0 - 36.0 g/dL   RDW 12.7 11.5 - 15.5 %   Platelets 274 150 - 400 K/uL   nRBC 0.0 0.0 - 0.2 %    Comment: Performed at [facility], [place] [address], [place], [place] [address]  Basic metabolic panel     Status: Abnormal   Collection Time: 12/13/18 10:14 AM  Result Value Ref Range   Sodium 137 135 - 145 mmol/L   Potassium 3.7 3.5 - 5.1 mmol/L   Chloride 105 98 - 111 mmol/L   CO2 22 22 - 32 mmol/L   Glucose, Bld 118 (H) 70 - 99 mg/dL   BUN 24 (H) 8 - 23 mg/dL   Creatinine, Ser 1.13 0.61 - 1.24 mg/dL   Calcium 8.5 (L) 8.9 - 10.3 mg/dL   GFR calc non Af Amer >60 >60 mL/min   GFR calc Af Amer >60 >60 mL/min   Anion gap 10 5 - 15    Comment: Performed at [facility], [place] [address], [place], [place] [address]  Urine Culture     Status: None   Collection Time: 12/13/18 11:19 AM  Result Value Ref Range   Specimen Description URINE, CLEAN CATCH    Special Requests NONE    Culture      NO GROWTH Performed at [facility], [place] [address] [place], [place] [address]    Report Status 12/14/2018 FINAL   Lactic acid, plasma     Status: None   Collection Time: 12/13/18 12:32 PM  Result Value Ref Range   Lactic Acid, Venous 0.9 0.5 - 1.9 mmol/L    Comment: Performed at [facility], [place] [address] [place], [place] [address]  CBC with Differential/Platelet     Status: Abnormal   Collection Time: 12/15/18  6:20 AM  Result Value Ref Range   WBC 12.6 (H) 4.0 - 10.5 K/uL   RBC 3.09 (L) 4.22 - 5.81 MIL/uL   Hemoglobin 9.1 (L) 13.0 - 17.0 g/dL   HCT 28.2 (L) 39.0 - 52.0 %   MCV 91.3 80.0 - 100.0 fL   MCH 29.4 26.0 - 34.0 pg   MCHC 32.3 30.0 - 36.0 g/dL   RDW 12.9 11.5 - 15.5 %   Platelets 312 150 - 400 K/uL   nRBC 0.0 0.0 - 0.2 %   Neutrophils Relative % 74 %   Neutro Abs 9.2 (H)  1.7 - 7.7 K/uL   Lymphocytes Relative 12 %   Lymphs Abs 1.5 0.7 - 4.0 K/uL    Monocytes Relative 9 %   Monocytes Absolute 1.2 (H) 0.1 - 1.0 K/uL   Eosinophils Relative 3 %   Eosinophils Absolute 0.4 0.0 - 0.5 K/uL   Basophils Relative 0 %   Basophils Absolute 0.0 0.0 - 0.1 K/uL   Immature Granulocytes 2 %   Abs Immature Granulocytes 0.30 (H) 0.00 - 0.07 K/uL    Comment: Performed at [place] [address] [place], [place] [address]  CBC with Differential/Platelet     Status: Abnormal   Collection Time: 12/17/18  9:15 AM  Result Value Ref Range   WBC 12.2 (H) 4.0 - 10.5 K/uL   RBC 3.25 (L) 4.22 - 5.81 MIL/uL   Hemoglobin 9.7 (L) 13.0 - 17.0 g/dL   HCT 29.6 (L) 39.0 - 52.0 %   MCV 91.1 80.0 - 100.0 fL   MCH 29.8 26.0 - 34.0 pg   MCHC 32.8 30.0 - 36.0 g/dL   RDW 12.9 11.5 - 15.5 %   Platelets 379 150 - 400 K/uL   nRBC 0.0 0.0 - 0.2 %   Neutrophils Relative % 71 %   Neutro Abs 8.9 (H) 1.7 - 7.7 K/uL   Band Neutrophils 2 %   Lymphocytes Relative 8 %   Lymphs Abs 1.0 0.7 - 4.0 K/uL   Monocytes Relative 11 %   Monocytes Absolute 1.3 (H) 0.1 - 1.0 K/uL   Eosinophils Relative 0 %   Eosinophils Absolute 0.0 0.0 - 0.5 K/uL   Basophils Relative 0 %   Basophils Absolute 0.0 0.0 - 0.1 K/uL   WBC Morphology MILD LEFT SHIFT (1-5% METAS, OCC MYELO, OCC BANDS)     Comment: TOXIC GRANULATION   Smear Review PLATELET CLUMPS NOTED ON SMEAR.    Metamyelocytes Relative 3 %   Myelocytes 3 %   Promyelocytes Relative 2 %   Abs Immature Granulocytes 1.00 (H) 0.00 - 0.07 K/uL    Comment: Performed at [facility], [place] [address], [place], [place] [address]  Basic metabolic panel     Status: Abnormal   Collection Time: 12/19/18  4:54 AM  Result Value Ref Range   Sodium 135 135 - 145 mmol/L   Potassium 3.8 3.5 - 5.1 mmol/L   Chloride 105 98 - 111 mmol/L   CO2 26 22 - 32 mmol/L   Glucose, Bld 107 (H) 70 - 99 mg/dL   BUN 16 8 - 23 mg/dL   Creatinine, Ser 1.09 0.61 - 1.24 mg/dL   Calcium 8.5 (L) 8.9 - 10.3 mg/dL   GFR calc non Af Amer >60 >60 mL/min   GFR  calc Af Amer >60 >60 mL/min   Anion gap 4 (L) 5 - 15    Comment: Performed at [facility], [place] [address], [place], [place] [address]  Basic metabolic panel     Status: Abnormal   Collection Time: 12/24/18 10:03 AM  Result Value Ref Range   Sodium 136 135 - 145 mmol/L   Potassium 3.7 3.5 - 5.1 mmol/L   Chloride 103 98 - 111 mmol/L   CO2 23 22 - 32 mmol/L   Glucose, Bld 136 (H) 70 - 99 mg/dL   BUN 14 8 - 23 mg/dL   Creatinine, Ser 1.09 0.61 - 1.24 mg/dL   Calcium 9.0 8.9 - 10.3 mg/dL   GFR calc non Af Amer >60 >  60 mL/min   GFR calc Af Amer >60 >60 mL/min   Anion gap 10 5 - 15    Comment: Performed at [place] [address], [place], [place] [address]  CBC with Differential/Platelet     Status: Abnormal   Collection Time: 12/24/18 10:03 AM  Result Value Ref Range   WBC 8.2 4.0 - 10.5 K/uL   RBC 3.51 (L) 4.22 - 5.81 MIL/uL   Hemoglobin 10.3 (L) 13.0 - 17.0 g/dL   HCT 32.7 (L) 39.0 - 52.0 %   MCV 93.2 80.0 - 100.0 fL   MCH 29.3 26.0 - 34.0 pg   MCHC 31.5 30.0 - 36.0 g/dL   RDW 13.0 11.5 - 15.5 %   Platelets 421 (H) 150 - 400 K/uL   nRBC 0.0 0.0 - 0.2 %   Neutrophils Relative % 75 %   Neutro Abs 6.2 1.7 - 7.7 K/uL   Lymphocytes Relative 14 %   Lymphs Abs 1.1 0.7 - 4.0 K/uL   Monocytes Relative 7 %   Monocytes Absolute 0.6 0.1 - 1.0 K/uL   Eosinophils Relative 2 %   Eosinophils Absolute 0.2 0.0 - 0.5 K/uL   Basophils Relative 0 %   Basophils Absolute 0.0 0.0 - 0.1 K/uL   Immature Granulocytes 2 %   Abs Immature Granulocytes 0.13 (H) 0.00 - 0.07 K/uL    Comment: Performed at [place] [address] [place], [place] [address]  Urinalysis with microscopic (not at [facility])     Status: Abnormal   Collection Time: 02/08/19 12:00 AM  Result Value Ref Range   Color - urine yellow    Appearance clear    Specific Gravity, Urine 1.014    pH, Urine 5.0    Protein, Urine Neg    Urine Glucose Neg    Ketones, urine NEg    Bilirubin Urine Neg    Blood, UA  Neg    Urobilinogen, UA Normal Normal   Nitrite, UA Negative Negative   Leukocytes, UA Negative (A) (none)  CBC and differential     Status: Abnormal   Collection Time: 02/08/19 12:00 AM  Result Value Ref Range   Hemoglobin 13.1 (A) 13.5 - 17.5   HCT 39 (A) 41 - 53   Neutrophils Absolute 4    Platelets 284 150 - 399   WBC 5.6   Basic metabolic panel     Status: None   Collection Time: 02/08/19 12:00 AM  Result Value Ref Range   Glucose 75    BUN 19 4 - 21   Creatinine 1.1 0.6 - 1.3   Potassium 4.1 3.4 - 5.3   Sodium 139 137 - 147  Hepatic function panel     Status: Abnormal   Collection Time: 02/08/19 12:00 AM  Result Value Ref Range   Alkaline Phosphatase 84 25 - 125   ALT 3 (A) 10 - 40   AST 30 14 - 40   Bilirubin, Total 0.7      Psychiatric Specialty Exam: Physical Exam  ROS  There were no vitals taken for this visit.There is no height or weight on file to calculate BMI.  General Appearance: NA  Eye Contact:  NA  Speech:  Slow  Volume:  Decreased  Mood:  Dysphoric  Affect:  NA  Thought Process:  Descriptions of Associations: Intact  Orientation:  Full (Time, Place, and Person)  Thought Content:  Hallucinations: Visual  Suicidal Thoughts:  No  Homicidal Thoughts:  No  Memory:  Immediate;   Fair Recent;   Fair Remote;   Fair  Judgement:  Fair  Insight:  Fair  Psychomotor Activity:  NA  Concentration:  Concentration: Fair and Attention Span: Fair  Recall:  [employer] of Knowledge:  Fair  Language:  Fair  Akathisia:  NA  Handed:  Right  AIMS (if indicated):     Assets:  Housing Resilience Social Support  ADL's:  Impaired  Cognition:  Impaired,  Mild  Sleep:         Assessment and Plan: Major depressive disorder, recurrent.  Generalized anxiety disorder.  I review recent discharge summary, current medication, blood work results and notes from his hospitalization.  Since he is back on Celexa 40 mg he is doing better.  He has hallucinations but they  are not as bad.  These hallucinations are usually visual and he is not acting on these hallucinations.  Recommend to resume therapy with Dr. Rexene Edison for CBT.  Discussed medication side effects and benefits.  Continue Celexa 40 mg daily and Wellbutrin 300 mg in the morning 150 mg in the evening.  Recommended to call us back if is any question or any concern.  Follow-up in 3 months.  Follow Up Instructions:    I discussed the assessment and treatment plan with the patient. The patient was provided an opportunity to ask questions and all were answered. The patient agreed with the plan and demonstrated an understanding of the instructions.   The patient was advised to call back or seek an in-person evaluation if the symptoms worsen or if the condition fails to improve as anticipated.  I provided 30 minutes of non-face-to-face time during this encounter.   Kathlee Nations, MD

## 2019-03-05 DIAGNOSIS — G20 Parkinson's disease: Secondary | ICD-10-CM | POA: Diagnosis not present

## 2019-03-05 DIAGNOSIS — G4733 Obstructive sleep apnea (adult) (pediatric): Secondary | ICD-10-CM | POA: Diagnosis not present

## 2019-03-05 DIAGNOSIS — I872 Venous insufficiency (chronic) (peripheral): Secondary | ICD-10-CM | POA: Diagnosis not present

## 2019-03-05 DIAGNOSIS — F332 Major depressive disorder, recurrent severe without psychotic features: Secondary | ICD-10-CM | POA: Diagnosis not present

## 2019-03-05 DIAGNOSIS — S72002D Fracture of unspecified part of neck of left femur, subsequent encounter for closed fracture with routine healing: Secondary | ICD-10-CM | POA: Diagnosis not present

## 2019-03-05 DIAGNOSIS — N3281 Overactive bladder: Secondary | ICD-10-CM | POA: Diagnosis not present

## 2019-03-06 DIAGNOSIS — G4733 Obstructive sleep apnea (adult) (pediatric): Secondary | ICD-10-CM | POA: Diagnosis not present

## 2019-03-06 DIAGNOSIS — G20 Parkinson's disease: Secondary | ICD-10-CM | POA: Diagnosis not present

## 2019-03-06 DIAGNOSIS — F332 Major depressive disorder, recurrent severe without psychotic features: Secondary | ICD-10-CM | POA: Diagnosis not present

## 2019-03-06 DIAGNOSIS — N3281 Overactive bladder: Secondary | ICD-10-CM | POA: Diagnosis not present

## 2019-03-06 DIAGNOSIS — S72002D Fracture of unspecified part of neck of left femur, subsequent encounter for closed fracture with routine healing: Secondary | ICD-10-CM | POA: Diagnosis not present

## 2019-03-06 DIAGNOSIS — I872 Venous insufficiency (chronic) (peripheral): Secondary | ICD-10-CM | POA: Diagnosis not present

## 2019-03-07 DIAGNOSIS — F332 Major depressive disorder, recurrent severe without psychotic features: Secondary | ICD-10-CM | POA: Diagnosis not present

## 2019-03-07 DIAGNOSIS — S72002D Fracture of unspecified part of neck of left femur, subsequent encounter for closed fracture with routine healing: Secondary | ICD-10-CM | POA: Diagnosis not present

## 2019-03-07 DIAGNOSIS — G4733 Obstructive sleep apnea (adult) (pediatric): Secondary | ICD-10-CM | POA: Diagnosis not present

## 2019-03-07 DIAGNOSIS — N3281 Overactive bladder: Secondary | ICD-10-CM | POA: Diagnosis not present

## 2019-03-07 DIAGNOSIS — I872 Venous insufficiency (chronic) (peripheral): Secondary | ICD-10-CM | POA: Diagnosis not present

## 2019-03-07 DIAGNOSIS — G20 Parkinson's disease: Secondary | ICD-10-CM | POA: Diagnosis not present

## 2019-03-08 ENCOUNTER — Telehealth: Payer: Self-pay | Admitting: Internal Medicine

## 2019-03-08 DIAGNOSIS — N3281 Overactive bladder: Secondary | ICD-10-CM | POA: Diagnosis not present

## 2019-03-08 DIAGNOSIS — I872 Venous insufficiency (chronic) (peripheral): Secondary | ICD-10-CM | POA: Diagnosis not present

## 2019-03-08 DIAGNOSIS — S72002D Fracture of unspecified part of neck of left femur, subsequent encounter for closed fracture with routine healing: Secondary | ICD-10-CM | POA: Diagnosis not present

## 2019-03-08 DIAGNOSIS — F332 Major depressive disorder, recurrent severe without psychotic features: Secondary | ICD-10-CM | POA: Diagnosis not present

## 2019-03-08 DIAGNOSIS — G20 Parkinson's disease: Secondary | ICD-10-CM | POA: Diagnosis not present

## 2019-03-08 DIAGNOSIS — G4733 Obstructive sleep apnea (adult) (pediatric): Secondary | ICD-10-CM | POA: Diagnosis not present

## 2019-03-08 NOTE — Telephone Encounter (Signed)
Verbals given  

## 2019-03-08 NOTE — Telephone Encounter (Signed)
Okay 

## 2019-03-08 NOTE — Telephone Encounter (Unsigned)
Copied from [place] [phone]. Topic: Quick Communication - Home Health Verbal Orders >> Mar 08, 2019 10:32 AM Percell Belt A wrote: Caller/Agency: margaret with [facility]  Callback Number: [phone] Requesting OT/PT/Skilled Nursing/Social Work/Speech Therapy: request an order for a MSW to help the wife id some community recourses

## 2019-03-11 DIAGNOSIS — S72002D Fracture of unspecified part of neck of left femur, subsequent encounter for closed fracture with routine healing: Secondary | ICD-10-CM | POA: Diagnosis not present

## 2019-03-11 DIAGNOSIS — I872 Venous insufficiency (chronic) (peripheral): Secondary | ICD-10-CM | POA: Diagnosis not present

## 2019-03-11 DIAGNOSIS — G4733 Obstructive sleep apnea (adult) (pediatric): Secondary | ICD-10-CM | POA: Diagnosis not present

## 2019-03-11 DIAGNOSIS — G20 Parkinson's disease: Secondary | ICD-10-CM | POA: Diagnosis not present

## 2019-03-11 DIAGNOSIS — F332 Major depressive disorder, recurrent severe without psychotic features: Secondary | ICD-10-CM | POA: Diagnosis not present

## 2019-03-11 DIAGNOSIS — N3281 Overactive bladder: Secondary | ICD-10-CM | POA: Diagnosis not present

## 2019-03-12 ENCOUNTER — Telehealth: Payer: Self-pay | Admitting: Internal Medicine

## 2019-03-12 DIAGNOSIS — N3281 Overactive bladder: Secondary | ICD-10-CM | POA: Diagnosis not present

## 2019-03-12 DIAGNOSIS — F332 Major depressive disorder, recurrent severe without psychotic features: Secondary | ICD-10-CM | POA: Diagnosis not present

## 2019-03-12 DIAGNOSIS — I872 Venous insufficiency (chronic) (peripheral): Secondary | ICD-10-CM | POA: Diagnosis not present

## 2019-03-12 DIAGNOSIS — G20 Parkinson's disease: Secondary | ICD-10-CM | POA: Diagnosis not present

## 2019-03-12 DIAGNOSIS — S72002D Fracture of unspecified part of neck of left femur, subsequent encounter for closed fracture with routine healing: Secondary | ICD-10-CM | POA: Diagnosis not present

## 2019-03-12 DIAGNOSIS — G4733 Obstructive sleep apnea (adult) (pediatric): Secondary | ICD-10-CM | POA: Diagnosis not present

## 2019-03-12 NOTE — Telephone Encounter (Unsigned)
Copied from [place] [id number]. Topic: Quick Communication - Home Health Verbal Orders >> Mar 12, 2019  1:13 PM Alanda Slim E wrote: Caller/Agency: [place] Number: [phone] Social Work Morene Antu spoke with family of the Pt about Respite Care over the phone so no home visit at this time./ please advise

## 2019-03-12 NOTE — Telephone Encounter (Signed)
Just an FYI no advise needed

## 2019-03-12 NOTE — Telephone Encounter (Signed)
I'm not sure what the question is about this?

## 2019-03-13 ENCOUNTER — Telehealth: Payer: Self-pay | Admitting: Physical Medicine & Rehabilitation

## 2019-03-13 DIAGNOSIS — I872 Venous insufficiency (chronic) (peripheral): Secondary | ICD-10-CM | POA: Diagnosis not present

## 2019-03-13 DIAGNOSIS — S72002D Fracture of unspecified part of neck of left femur, subsequent encounter for closed fracture with routine healing: Secondary | ICD-10-CM | POA: Diagnosis not present

## 2019-03-13 DIAGNOSIS — N3281 Overactive bladder: Secondary | ICD-10-CM | POA: Diagnosis not present

## 2019-03-13 DIAGNOSIS — G20 Parkinson's disease: Secondary | ICD-10-CM | POA: Diagnosis not present

## 2019-03-13 DIAGNOSIS — G4733 Obstructive sleep apnea (adult) (pediatric): Secondary | ICD-10-CM | POA: Diagnosis not present

## 2019-03-13 DIAGNOSIS — F332 Major depressive disorder, recurrent severe without psychotic features: Secondary | ICD-10-CM | POA: Diagnosis not present

## 2019-03-13 NOTE — Telephone Encounter (Signed)
Patient's wife called about appointment for Monday with Dr. Posey Pronto and I told her that he is an in person visit.  I looked at his risk sore and he is a low risk.  She said "You need to change that in your system and I told her I couldn't,this is set through [facility].  She was very irate and said I can promise you he isn't coming in to office or will ever, he is a high risk patient.  I tried to explain to her that we can do a Web Ex visit for Monday, but he needed to come in next month he would have to be an in person visit, per [employer] and [employer].  She didn't want to hear what I was telling her and I then told her she can speak to my Glass blower/designer.  Please call wife.

## 2019-03-14 ENCOUNTER — Ambulatory Visit: Payer: Medicare Other | Admitting: Physical Therapy

## 2019-03-14 ENCOUNTER — Encounter: Payer: Self-pay | Admitting: Speech Pathology

## 2019-03-14 ENCOUNTER — Encounter: Payer: Self-pay | Admitting: Occupational Therapy

## 2019-03-14 ENCOUNTER — Ambulatory Visit: Payer: Medicare Other

## 2019-03-14 ENCOUNTER — Ambulatory Visit: Payer: Self-pay | Admitting: Physical Therapy

## 2019-03-14 ENCOUNTER — Ambulatory Visit: Payer: Medicare Other | Admitting: Occupational Therapy

## 2019-03-18 ENCOUNTER — Encounter: Payer: Self-pay | Admitting: Physical Medicine & Rehabilitation

## 2019-03-18 ENCOUNTER — Encounter: Payer: Medicare Other | Attending: Physical Medicine & Rehabilitation | Admitting: Physical Medicine & Rehabilitation

## 2019-03-18 ENCOUNTER — Other Ambulatory Visit: Payer: Self-pay

## 2019-03-18 VITALS — BP 110/70 | Ht 74.0 in | Wt 300.0 lb

## 2019-03-18 DIAGNOSIS — K5904 Chronic idiopathic constipation: Secondary | ICD-10-CM | POA: Insufficient documentation

## 2019-03-18 DIAGNOSIS — S72002D Fracture of unspecified part of neck of left femur, subsequent encounter for closed fracture with routine healing: Secondary | ICD-10-CM | POA: Diagnosis not present

## 2019-03-18 DIAGNOSIS — G20 Parkinson's disease: Secondary | ICD-10-CM | POA: Diagnosis not present

## 2019-03-18 DIAGNOSIS — G4733 Obstructive sleep apnea (adult) (pediatric): Secondary | ICD-10-CM | POA: Diagnosis not present

## 2019-03-18 DIAGNOSIS — N3281 Overactive bladder: Secondary | ICD-10-CM

## 2019-03-18 DIAGNOSIS — I872 Venous insufficiency (chronic) (peripheral): Secondary | ICD-10-CM | POA: Diagnosis not present

## 2019-03-18 DIAGNOSIS — S72002A Fracture of unspecified part of neck of left femur, initial encounter for closed fracture: Secondary | ICD-10-CM | POA: Diagnosis not present

## 2019-03-18 DIAGNOSIS — F332 Major depressive disorder, recurrent severe without psychotic features: Secondary | ICD-10-CM | POA: Diagnosis not present

## 2019-03-18 NOTE — Progress Notes (Signed)
Subjective:    Patient ID: [name] [name], male    DOB: Apr 09, 1951, 68 y.o.   MRN: [id number]  TELEHEALTH NOTE  Due to national recommendations of social distancing due to COVID 19, an audio/video telehealth visit is felt to be most appropriate for this patient at this time.  See Chart message from today for the patient's consent to telehealth from [place].     I verified that I am speaking with the correct person using two identifiers.  Location of patient: Home Location of provider: Office Method of communication: WebEx Names of participants : [name] [name] scheduling, [name] [name] obtaining consent and vitals if available Established patient Time spent on call: 11 minutes  HPI 68 year old right-handed male with history of Parkinson's disease presents for follow-up for left hip fracture status post total hip replacement. They have not had repeat xrays. She notes improvement in BP.  Bladder and bowel movements are relatively controlled. Continues to use walker and cane.  Denies falls.   Pain Inventory Average Pain 3 Pain Right Now 0 My pain is intermittent, dull and aching  In the last 24 hours, has pain interfered with the following? General activity 0 Relation with others 0 Enjoyment of life 0 What TIME of day is your pain at its worst? varries Sleep (in general) Fair  Pain is worse with: inactivity and some activites Pain improves with: therapy/exercise and pacing activities Relief from Meds: na  Mobility walk without assistance walk with assistance use a cane use a walker ability to climb steps?  no do you drive?  no  Function retired  Neuro/Psych bladder control problems weakness numbness tremor tingling trouble walking spasms dizziness confusion depression anxiety  Prior Studies CT/MRI  Physicians involved in your care Any changes since last visit?  no   Family History  Problem Relation Age of Onset   . Lung cancer Father   . Alcohol abuse Brother   . Drug abuse Brother   . Seizures Daughter   . Colon cancer Neg Hx   . Stomach cancer Neg Hx   . Rectal cancer Neg Hx   . Esophageal cancer Neg Hx    Social History   Socioeconomic History  . Marital status: Married    Spouse name: Not on file  . Number of children: Y  . Years of education: Not on file  . Highest education level: Not on file  Occupational History  . 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 Financial resource strain: Not on file  . Food insecurity    Worry: Not on file    Inability: Not on file  . Transportation needs    Medical: Not on file    Non-medical: Not on file  Tobacco Use  . Smoking status: Never Smoker  . Smokeless tobacco: Never Used  Substance and Sexual Activity  . Alcohol use: No    Alcohol/week: 0.0 standard drinks  . Drug use: No  . Sexual activity: Yes    Birth control/protection: None  Lifestyle  . Physical activity    Days per week: Not on file    Minutes per session: Not on file  . Stress: Not on file  Relationships  . Social Herbalist on phone: Not on file    Gets together: Not on file    Attends religious service: Not on file    Active member of  club or organization: Not on file    Attends meetings of clubs or organizations: Not on file    Relationship status: Not on file  Other Topics Concern  . Not on file  Social History Narrative   [place] partner    Past Surgical History:  Procedure Laterality Date  . APPENDECTOMY  1967  . COLONOSCOPY    . NASAL SINUS SURGERY     x 4 as a child  . TOTAL HIP ARTHROPLASTY Left 12/09/2018   Procedure: TOTAL HIP ARTHROPLASTY ANTERIOR APPROACH;  Surgeon: Leandrew Koyanagi, MD;  Location: [facility];  Service: Orthopedics;  Laterality: Left;  Marland Kitchen VASECTOMY     Past Medical History:  Diagnosis Date  . APPENDECTOMY, HX OF 02/18/2008   Qualifier: Diagnosis of  By: Danny Lawless CMA, [place]     . Cardiac murmur    as a child  . Chronic idiopathic constipation   . Depression   . Headache(784.0)   . HEADACHES, HX OF 02/18/2008   Qualifier: Diagnosis of  By: Danny Lawless CMA, [place]    . Parkinson disease ([place]) 11/2014  . Sinus mucosal thickening    pt unable to lay flat  . Streptococcal meningitis    as an infant   BP 110/70 Comment: pt reported virtual visit  Ht 6\' 2"  (1.88 m) Comment: pt reported virtual visit  Wt 300 lb (136.1 kg) Comment: pt reported virtual visit  BMI 38.52 kg/m   Opioid Risk Score:   Fall Risk Score:  `1  Depression screen PHQ 2/9  No flowsheet data found.   Review of Systems  Constitutional: Negative.   HENT: Negative.   Eyes: Negative.   Respiratory: Negative.   Cardiovascular: Negative.   Gastrointestinal: Negative.   Endocrine: Negative.   Genitourinary: Negative.   Musculoskeletal: Positive for arthralgias, gait problem and myalgias.  Skin: Negative.   Allergic/Immunologic: Negative.   Neurological: Positive for dizziness, tremors, weakness and numbness.  Hematological: Negative.   Psychiatric/Behavioral: Positive for confusion and dysphoric mood. The patient is nervous/anxious.   All other systems reviewed and are negative.      Objective:   Physical Exam Gen: NAD. HENT: Normocephalic, Atraumatic Eyes: EOMI. No discharge.  Cardio: No JVD. Pulm: Effort normal  Neuro: Alert     Assessment & Plan:  68 year old right-handed male with history of Parkinson's disease presents for follow-up for left hip fracture status post total hip replacement.  1.  Decreased functional mobility secondary to  Left femoral neck fracture. 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   Continue therapies, per wife Ortho prescribing  Follow-up with Ortho-x-ray scheduled, recommended  2. Mood:    Continue meds-patient notes improvement at home   3. Parkinson disease.   Continue meds   Continue follow-up with neurology    Wife notes decrease in function due to inability to engage with physical trainer as result of COVID-19   Continue therapies  4. Chronic idiopathic constipation.   Continue meds   Controlled  5. Overactive bladder.   Continue meds   Controlled  6.  Gait abnormality  Continue walker for safety  Continue therapies

## 2019-03-19 DIAGNOSIS — F332 Major depressive disorder, recurrent severe without psychotic features: Secondary | ICD-10-CM | POA: Diagnosis not present

## 2019-03-19 DIAGNOSIS — I872 Venous insufficiency (chronic) (peripheral): Secondary | ICD-10-CM | POA: Diagnosis not present

## 2019-03-19 DIAGNOSIS — S72002D Fracture of unspecified part of neck of left femur, subsequent encounter for closed fracture with routine healing: Secondary | ICD-10-CM | POA: Diagnosis not present

## 2019-03-19 DIAGNOSIS — G4733 Obstructive sleep apnea (adult) (pediatric): Secondary | ICD-10-CM | POA: Diagnosis not present

## 2019-03-19 DIAGNOSIS — N3281 Overactive bladder: Secondary | ICD-10-CM | POA: Diagnosis not present

## 2019-03-19 DIAGNOSIS — G20 Parkinson's disease: Secondary | ICD-10-CM | POA: Diagnosis not present

## 2019-03-20 DIAGNOSIS — F332 Major depressive disorder, recurrent severe without psychotic features: Secondary | ICD-10-CM | POA: Diagnosis not present

## 2019-03-20 DIAGNOSIS — N3281 Overactive bladder: Secondary | ICD-10-CM | POA: Diagnosis not present

## 2019-03-20 DIAGNOSIS — I872 Venous insufficiency (chronic) (peripheral): Secondary | ICD-10-CM | POA: Diagnosis not present

## 2019-03-20 DIAGNOSIS — G20 Parkinson's disease: Secondary | ICD-10-CM | POA: Diagnosis not present

## 2019-03-20 DIAGNOSIS — G4733 Obstructive sleep apnea (adult) (pediatric): Secondary | ICD-10-CM | POA: Diagnosis not present

## 2019-03-20 DIAGNOSIS — S72002D Fracture of unspecified part of neck of left femur, subsequent encounter for closed fracture with routine healing: Secondary | ICD-10-CM | POA: Diagnosis not present

## 2019-03-22 DIAGNOSIS — G4733 Obstructive sleep apnea (adult) (pediatric): Secondary | ICD-10-CM | POA: Diagnosis not present

## 2019-03-22 DIAGNOSIS — G20 Parkinson's disease: Secondary | ICD-10-CM | POA: Diagnosis not present

## 2019-03-22 DIAGNOSIS — F332 Major depressive disorder, recurrent severe without psychotic features: Secondary | ICD-10-CM | POA: Diagnosis not present

## 2019-03-22 DIAGNOSIS — S72002D Fracture of unspecified part of neck of left femur, subsequent encounter for closed fracture with routine healing: Secondary | ICD-10-CM | POA: Diagnosis not present

## 2019-03-22 DIAGNOSIS — N3281 Overactive bladder: Secondary | ICD-10-CM | POA: Diagnosis not present

## 2019-03-22 DIAGNOSIS — I872 Venous insufficiency (chronic) (peripheral): Secondary | ICD-10-CM | POA: Diagnosis not present

## 2019-03-26 ENCOUNTER — Telehealth: Payer: Self-pay | Admitting: Neurology

## 2019-03-26 DIAGNOSIS — G4733 Obstructive sleep apnea (adult) (pediatric): Secondary | ICD-10-CM | POA: Diagnosis not present

## 2019-03-26 DIAGNOSIS — I872 Venous insufficiency (chronic) (peripheral): Secondary | ICD-10-CM | POA: Diagnosis not present

## 2019-03-26 DIAGNOSIS — S72002D Fracture of unspecified part of neck of left femur, subsequent encounter for closed fracture with routine healing: Secondary | ICD-10-CM | POA: Diagnosis not present

## 2019-03-26 DIAGNOSIS — G20 Parkinson's disease: Secondary | ICD-10-CM | POA: Diagnosis not present

## 2019-03-26 DIAGNOSIS — N3281 Overactive bladder: Secondary | ICD-10-CM | POA: Diagnosis not present

## 2019-03-26 DIAGNOSIS — F332 Major depressive disorder, recurrent severe without psychotic features: Secondary | ICD-10-CM | POA: Diagnosis not present

## 2019-03-26 NOTE — Telephone Encounter (Signed)
Patient would like to speak someone today about what is going on with patient, he is having some problems and parkinson is getting worst.

## 2019-03-26 NOTE — Telephone Encounter (Signed)
Called spoke with spouse she was informed of provider response  She would like to know if he could be place on waiting list with Dr. Melvyn Novas

## 2019-03-26 NOTE — Telephone Encounter (Signed)
She also states that Pervis Hocking recommended patient see Neuropsychiatrist  She would like your opinion on this and if he could be referred to someone you know.  [place] for wait list for Dr. Melvyn Novas

## 2019-03-26 NOTE — Telephone Encounter (Signed)
This is not what Dr. Melvyn Novas does.  We don't see PTSD at all.  They will need to deal with that with psychiatry and counseling.  Dr. Melvyn Novas only does testing and otherwise doesn't see patients for counseling/therapy, etc.

## 2019-03-26 NOTE — Telephone Encounter (Signed)
If just over the last 5 days, call PCP to make sure nothing like UTI going on contributing.  Also, hold comtan (entacapone) for now

## 2019-03-26 NOTE — Telephone Encounter (Addendum)
Called spoke with spouse she states patient is in his 5th day of hallucination auditory and visual. Delusions, started having tremors mostly at night, having trouble standing up.  Pt called with c/o:  confusion/hallucinations New medications?  No. When did they start? Started Friday night 5 days ago If hallucinations are new, has patient been checked for infection, including UTI?  Yes.  no UTI  Current medications prescribed by Dr. Carles Collet and TIMES taking the medications: REQUIP 1 MG TID 7AM/11AM/4PM SINEMENT 25/100 MG 2 tablets AT 7AM 2 tablets 11AM 1 tablets AT 4PM ENTACAPONE  200 MG TID 7AM/11AM/4PM

## 2019-03-26 NOTE — Telephone Encounter (Signed)
He sees psychiatry and counseling.  What service does she think Dr. Melvyn Novas can assist with?

## 2019-03-26 NOTE — Telephone Encounter (Signed)
Pt spouse is requesting help with patient PTSD from fall She believe that the episodes are coming from the fall   She says he is stilling his psychiatrist. She doesn't thinks that the zoom visit with psychiatrist and counseling is helping him, she has not seen any improvement with him. She states that Dr. Pervis Hocking recommend patient to see neuropsychiatrist to help with his episodes.

## 2019-03-27 ENCOUNTER — Ambulatory Visit: Payer: Medicare Other | Admitting: Psychology

## 2019-03-27 ENCOUNTER — Telehealth: Payer: Self-pay | Admitting: Internal Medicine

## 2019-03-27 NOTE — Telephone Encounter (Signed)
Called spoke with patient spouse she was informed of this.

## 2019-03-27 NOTE — Telephone Encounter (Signed)
Pt wife called and stated that patients home health is ending with [facility] and would like to extend services. Pt wife would like a call from the nurse.

## 2019-03-28 ENCOUNTER — Telehealth: Payer: Self-pay | Admitting: Neurology

## 2019-03-28 ENCOUNTER — Other Ambulatory Visit: Payer: Self-pay

## 2019-03-28 ENCOUNTER — Encounter: Payer: Self-pay | Admitting: Internal Medicine

## 2019-03-28 ENCOUNTER — Ambulatory Visit (INDEPENDENT_AMBULATORY_CARE_PROVIDER_SITE_OTHER): Payer: Medicare Other | Admitting: Internal Medicine

## 2019-03-28 DIAGNOSIS — N3281 Overactive bladder: Secondary | ICD-10-CM | POA: Diagnosis not present

## 2019-03-28 DIAGNOSIS — R443 Hallucinations, unspecified: Secondary | ICD-10-CM

## 2019-03-28 DIAGNOSIS — R410 Disorientation, unspecified: Secondary | ICD-10-CM

## 2019-03-28 DIAGNOSIS — F332 Major depressive disorder, recurrent severe without psychotic features: Secondary | ICD-10-CM | POA: Diagnosis not present

## 2019-03-28 DIAGNOSIS — I872 Venous insufficiency (chronic) (peripheral): Secondary | ICD-10-CM | POA: Diagnosis not present

## 2019-03-28 DIAGNOSIS — S72002D Fracture of unspecified part of neck of left femur, subsequent encounter for closed fracture with routine healing: Secondary | ICD-10-CM | POA: Diagnosis not present

## 2019-03-28 DIAGNOSIS — G20 Parkinson's disease: Secondary | ICD-10-CM | POA: Diagnosis not present

## 2019-03-28 DIAGNOSIS — G4733 Obstructive sleep apnea (adult) (pediatric): Secondary | ICD-10-CM | POA: Diagnosis not present

## 2019-03-28 NOTE — Assessment & Plan Note (Signed)
Again he is having about 7 days of acute mental status change. The wife is a poor historian and hysterical about this during our visit so is unable to give good information. It is concerning for lewy body dementia with the auditory and visual hallucinations versus seizure disorder versus parkinson's dementia versus some psychiatric disorder. I do not have a good way to examine the patient as the wife refuses to bring him anywhere or call 911 to transport to ER. Have asked home health who was there for visit during our video visit to capture U/A and culture to rule out acute infection. There are no symptoms for other infection clinically. If the urine test is clear MRI would help look for changes typical for dementia causing symptoms. Will reach out to his neurologist to see if they feel EEG outpatient could be helpful with the tremor and confusion episodes with symptoms.

## 2019-03-28 NOTE — Assessment & Plan Note (Signed)
It is very unclear to me the cause of these episodes. Previously in the midst of symptoms we have checked labs and urine without cause. He has had CT with some changes but no acute changes. MRI would be helpful to distinguish better. Will check in with neurology to see if there is a role for EEG to evaluate his staring episodes and tremors. The other cause which is difficult to rule out especially without patient participation is psychotic episode.

## 2019-03-28 NOTE — Progress Notes (Signed)
Virtual Visit via Video Note  I connected with [name] [name] on 03/28/19 at  3:40 PM EDT by a video enabled telemedicine application and verified that I am speaking with the correct person using two identifiers.  The patient and the provider were at separate locations throughout the entire encounter.   I discussed the limitations of evaluation and management by telemedicine and the availability of in person appointments. The patient expressed understanding and agreed to proceed.  History of Present Illness: The patient is a 68 y.o. man with visit for hallucinations auditory and visual and confusion. His wife states that this started about 7 days ago. She is a difficult historian and hysterical during our visit to some degree. She states that the confusion is better and worse. Knows who he is and his wife but sometimes does not know where he is or what it going on. Seeing things which are not there, she cannot elaborate. She states he is hearing things and carrying on a conversation with someone. She is not sure what caused this. No changes in meds prior to onset. She called neurologist and is massively frustrated with this experience. She states that she feels like a ping pong ball and every time she calls the neurologist they tell her to call PCP. She then states that earlier this week they told her to get a urine. Home health is there now and can check the urine but needs order. They called neurologist for this and they said to get it from PCP. They then called [place] and we did not know anything about the cause or need. She states no pain with urination. He is not urinating as much. Staying dry overnight and has only urinated once today. He is eating and drinking normally. They have noticed no pain in the last week in his stomach or chest. BM daily and no problems there. No diarrhea. No SOB or cough. No fevers or chills. The wife admits that the episodes or "spells" as she calls them are more frequent and  lasting longer. The first time this really happened was May and we did labs and urine at that time without cause. We did CT head about 1 month ago without acute findings. She states that he is having tremor episodes which last hours with jerks to his limbs that last about 1 second each. Happen bilateral. He seems not responsive during this episodes. His confusion comes and goes but never fades. Has been worsening with less urination in the last 24 hours. Has tried stopping comtan per neurology advice 2 days ago.   PMH, [facility], social history reviewed and updated  Caveat visit was conducted with the patient present but all information coming from the home health nurse and the wife as the patient has mental status change and cannot participate in the visit  Observations/Objective: Appearance: not responsive, sitting up in the chair, breathing appears normal, no coughing during visit, casual grooming, abdomen does not appear distended, memory unable to asses, mental status is unable to assess  Assessment and Plan: See problem oriented charting  Follow Up Instructions: ordered U/A and culture drawn by home health, with the understanding that if there is no infection he would need further workup and that this may not give [place] any answers  Visit time 40 minutes: greater than 50% of that time was spent in face to face counseling and coordination of care with the patient: counseled about various causes of his hallucinations including psych disorders, seizure disorder, DLB,  PDD, infection, other etiology as well as the limitations of this format of visit and how we cannot assess him well this way and may not be able to get her an answer due to inadequate assessment. Wife refuses to take him to ER, she is aware that it will likely take days for the U/A to result from home health and we will not be able to treat during that time  I discussed the assessment and treatment plan with the patient. The patient was provided  an opportunity to ask questions and all were answered. The patient agreed with the plan and demonstrated an understanding of the instructions.   The patient was advised to call back or seek an in-person evaluation if the symptoms worsen or if the condition fails to improve as anticipated.  [name], MD

## 2019-03-28 NOTE — Telephone Encounter (Signed)
Katharine Look at [facility] called while at pt home requesting order for cath for urine lab. Per Tat assistant they need to call PCP for order. Confirmed in prior note from [facility] to A.Mason CMA.

## 2019-03-29 ENCOUNTER — Telehealth: Payer: Self-pay | Admitting: Internal Medicine

## 2019-03-29 DIAGNOSIS — N390 Urinary tract infection, site not specified: Secondary | ICD-10-CM | POA: Diagnosis not present

## 2019-03-29 DIAGNOSIS — N3281 Overactive bladder: Secondary | ICD-10-CM | POA: Diagnosis not present

## 2019-03-29 LAB — URINALYSIS W MICROSCOPIC (NOT AT ARMC)
Bilirubin Urine: NEGATIVE
Blood, UA: NEGATIVE
Glucose: NEGATIVE
Ketones, urine: NEGATIVE
Leukocytes, UA: NEGATIVE — AB
Nitrite, UA: NEGATIVE
Protein, Urine: 10
RBC, UA: 2
Specific Gravity, Urine: 1.027
Urobilinogen, UA: NORMAL
WBC, UA: 11
pH, Urine: 5.5

## 2019-03-29 NOTE — Telephone Encounter (Signed)
Fine

## 2019-03-29 NOTE — Telephone Encounter (Signed)
I spoke to [name] [name], case manager for patient. She states the patient's parkinson's is  worse, he is declining, They are trying for a  Medicare maintenance program. They feel that the patient needs placement as they can not manage his care much longer. [name] [name] stated they "will continue what we can". She states patient's wife is not understanding fully and they have already been in the home for (2) 60 day episodes. Patient is certified until 3/68/59 to keep receiving services.   [name] Shown, MD

## 2019-03-29 NOTE — Telephone Encounter (Signed)
I called wife- ok to contact [facility] with verbal orders for patient to extend services?

## 2019-03-29 NOTE — Telephone Encounter (Signed)
Home Health Verbal Orders - Caller/Agency: [facility]   Callback Number: [phone] Requesting OT/PT/Skilled Nursing/Social Work/Speech Therapy: pt  Frequency: requesting re-assesment  1 week1

## 2019-03-30 DIAGNOSIS — Z9181 History of falling: Secondary | ICD-10-CM | POA: Diagnosis not present

## 2019-03-30 DIAGNOSIS — I872 Venous insufficiency (chronic) (peripheral): Secondary | ICD-10-CM | POA: Diagnosis not present

## 2019-03-30 DIAGNOSIS — G20 Parkinson's disease: Secondary | ICD-10-CM | POA: Diagnosis not present

## 2019-03-30 DIAGNOSIS — Z96642 Presence of left artificial hip joint: Secondary | ICD-10-CM | POA: Diagnosis not present

## 2019-03-30 DIAGNOSIS — N3281 Overactive bladder: Secondary | ICD-10-CM | POA: Diagnosis not present

## 2019-03-30 DIAGNOSIS — S72002D Fracture of unspecified part of neck of left femur, subsequent encounter for closed fracture with routine healing: Secondary | ICD-10-CM | POA: Diagnosis not present

## 2019-03-30 DIAGNOSIS — G4733 Obstructive sleep apnea (adult) (pediatric): Secondary | ICD-10-CM | POA: Diagnosis not present

## 2019-03-30 DIAGNOSIS — F332 Major depressive disorder, recurrent severe without psychotic features: Secondary | ICD-10-CM | POA: Diagnosis not present

## 2019-04-01 ENCOUNTER — Telehealth: Payer: Self-pay | Admitting: Neurology

## 2019-04-01 DIAGNOSIS — G4733 Obstructive sleep apnea (adult) (pediatric): Secondary | ICD-10-CM | POA: Diagnosis not present

## 2019-04-01 DIAGNOSIS — F332 Major depressive disorder, recurrent severe without psychotic features: Secondary | ICD-10-CM | POA: Diagnosis not present

## 2019-04-01 DIAGNOSIS — N3281 Overactive bladder: Secondary | ICD-10-CM | POA: Diagnosis not present

## 2019-04-01 DIAGNOSIS — G20 Parkinson's disease: Secondary | ICD-10-CM | POA: Diagnosis not present

## 2019-04-01 DIAGNOSIS — S72002D Fracture of unspecified part of neck of left femur, subsequent encounter for closed fracture with routine healing: Secondary | ICD-10-CM | POA: Diagnosis not present

## 2019-04-01 DIAGNOSIS — I872 Venous insufficiency (chronic) (peripheral): Secondary | ICD-10-CM | POA: Diagnosis not present

## 2019-04-01 NOTE — Telephone Encounter (Signed)
Called spoke with spouse she states that patient is doing a lot better he is finally coming out of these episode he was having. U/A has been done by [facility] results will be back within 1-2 days from now. Unable to do in/out cath patient was unable to void even with cath, but they was able to get a urine sample

## 2019-04-01 NOTE — Telephone Encounter (Signed)
Please call wife/pt and find out how doing.  Seems to be some misunderstanding.  I got an order for a condom cath, which I didn't want to sign as out of my field.  On that same order the home health company had the UA I ordered and asked them to put that on another paper so I could sign it but they took it as refusal to sign the order, which was not the case.  Concerned about pt have MS change for 1 week or so, but that would be unusual for PD alone to do that and glad it is being investigated further.  Per chart, wife refusing to visit ED or an office for detailed examination.

## 2019-04-01 NOTE — Telephone Encounter (Signed)
Great.  Glad to hear it.  I know that I d/c his entacapone so we will keep him off of that for now.   Thanks for calling him/her

## 2019-04-01 NOTE — Telephone Encounter (Signed)
noted 

## 2019-04-01 NOTE — Telephone Encounter (Signed)
LVM with verbals 

## 2019-04-01 NOTE — Telephone Encounter (Signed)
Fine

## 2019-04-02 DIAGNOSIS — N3281 Overactive bladder: Secondary | ICD-10-CM | POA: Diagnosis not present

## 2019-04-02 DIAGNOSIS — G4733 Obstructive sleep apnea (adult) (pediatric): Secondary | ICD-10-CM | POA: Diagnosis not present

## 2019-04-02 DIAGNOSIS — G20 Parkinson's disease: Secondary | ICD-10-CM | POA: Diagnosis not present

## 2019-04-02 DIAGNOSIS — S72002D Fracture of unspecified part of neck of left femur, subsequent encounter for closed fracture with routine healing: Secondary | ICD-10-CM | POA: Diagnosis not present

## 2019-04-02 DIAGNOSIS — F332 Major depressive disorder, recurrent severe without psychotic features: Secondary | ICD-10-CM | POA: Diagnosis not present

## 2019-04-02 DIAGNOSIS — I872 Venous insufficiency (chronic) (peripheral): Secondary | ICD-10-CM | POA: Diagnosis not present

## 2019-04-03 ENCOUNTER — Encounter: Payer: Self-pay | Admitting: Internal Medicine

## 2019-04-03 DIAGNOSIS — G20 Parkinson's disease: Secondary | ICD-10-CM | POA: Diagnosis not present

## 2019-04-03 DIAGNOSIS — F332 Major depressive disorder, recurrent severe without psychotic features: Secondary | ICD-10-CM | POA: Diagnosis not present

## 2019-04-03 DIAGNOSIS — S72002D Fracture of unspecified part of neck of left femur, subsequent encounter for closed fracture with routine healing: Secondary | ICD-10-CM | POA: Diagnosis not present

## 2019-04-03 DIAGNOSIS — I872 Venous insufficiency (chronic) (peripheral): Secondary | ICD-10-CM | POA: Diagnosis not present

## 2019-04-03 DIAGNOSIS — G4733 Obstructive sleep apnea (adult) (pediatric): Secondary | ICD-10-CM | POA: Diagnosis not present

## 2019-04-03 DIAGNOSIS — N3281 Overactive bladder: Secondary | ICD-10-CM | POA: Diagnosis not present

## 2019-04-03 NOTE — Progress Notes (Signed)
Abstracted and sent to scan  

## 2019-04-04 ENCOUNTER — Telehealth: Payer: Self-pay | Admitting: Neurology

## 2019-04-04 ENCOUNTER — Telehealth: Payer: Self-pay

## 2019-04-04 DIAGNOSIS — I872 Venous insufficiency (chronic) (peripheral): Secondary | ICD-10-CM | POA: Diagnosis not present

## 2019-04-04 DIAGNOSIS — S72002D Fracture of unspecified part of neck of left femur, subsequent encounter for closed fracture with routine healing: Secondary | ICD-10-CM | POA: Diagnosis not present

## 2019-04-04 DIAGNOSIS — N3281 Overactive bladder: Secondary | ICD-10-CM | POA: Diagnosis not present

## 2019-04-04 DIAGNOSIS — G20 Parkinson's disease: Secondary | ICD-10-CM | POA: Diagnosis not present

## 2019-04-04 DIAGNOSIS — F332 Major depressive disorder, recurrent severe without psychotic features: Secondary | ICD-10-CM | POA: Diagnosis not present

## 2019-04-04 DIAGNOSIS — G4733 Obstructive sleep apnea (adult) (pediatric): Secondary | ICD-10-CM | POA: Diagnosis not present

## 2019-04-04 NOTE — Telephone Encounter (Signed)
Called spoke with patient spouse the [place] sent her a document for patient to get benefits.  She mailed form to office to be completed by Dr. Carles Collet  FORM Catastrophically Disable Veteran Evaluation form  Please be aware form will be delivered via mail

## 2019-04-04 NOTE — Telephone Encounter (Signed)
error 

## 2019-04-04 NOTE — Telephone Encounter (Signed)
New Message  Joycelyn Schmid verbalized sent by mail application for catastrophically disable veteran evaluation and it should be faxed.  Please f/u

## 2019-04-08 ENCOUNTER — Telehealth: Payer: Self-pay | Admitting: Neurology

## 2019-04-08 DIAGNOSIS — N3281 Overactive bladder: Secondary | ICD-10-CM | POA: Diagnosis not present

## 2019-04-08 DIAGNOSIS — F332 Major depressive disorder, recurrent severe without psychotic features: Secondary | ICD-10-CM | POA: Diagnosis not present

## 2019-04-08 DIAGNOSIS — I872 Venous insufficiency (chronic) (peripheral): Secondary | ICD-10-CM | POA: Diagnosis not present

## 2019-04-08 DIAGNOSIS — G4733 Obstructive sleep apnea (adult) (pediatric): Secondary | ICD-10-CM | POA: Diagnosis not present

## 2019-04-08 DIAGNOSIS — G20 Parkinson's disease: Secondary | ICD-10-CM | POA: Diagnosis not present

## 2019-04-08 DIAGNOSIS — S72002D Fracture of unspecified part of neck of left femur, subsequent encounter for closed fracture with routine healing: Secondary | ICD-10-CM | POA: Diagnosis not present

## 2019-04-08 NOTE — Telephone Encounter (Signed)
Received forms in the mail that were pre-filled out by the patient's wife that were labeled "catastrophically disabled veteran evaluation."  Under section 1A, as stated "the veteran is catastrophically disabled if the veteran has 1 of the following permanent conditions.  Check the conditions for which the veteran qualifies. 1.  Quadriplegia and quadriparesis  2.  Paraplegia;  3.  Legal blindness defined as visual impairment of 20/200 or less visual acuity in the better seeing eye with corrective lenses, or a visual field restriction of 20 degrees or less in the better seeing eye with corrective lenses. 4.  Persistent vegetative state  The patient's wife had already filled out the form and checked that he had qualified based on quadriplegia and quadriparesis.  She wanted me to sign and fill out this form.  [employer], please let the patient's wife know that neurologically, he does not meet the medical definition for any of these conditions.  I know they need and want medical services, but I am unable to state that he has conditions that he does not have.

## 2019-04-09 DIAGNOSIS — G4733 Obstructive sleep apnea (adult) (pediatric): Secondary | ICD-10-CM | POA: Diagnosis not present

## 2019-04-09 DIAGNOSIS — N3281 Overactive bladder: Secondary | ICD-10-CM | POA: Diagnosis not present

## 2019-04-09 DIAGNOSIS — I872 Venous insufficiency (chronic) (peripheral): Secondary | ICD-10-CM | POA: Diagnosis not present

## 2019-04-09 DIAGNOSIS — F332 Major depressive disorder, recurrent severe without psychotic features: Secondary | ICD-10-CM | POA: Diagnosis not present

## 2019-04-09 DIAGNOSIS — S72002D Fracture of unspecified part of neck of left femur, subsequent encounter for closed fracture with routine healing: Secondary | ICD-10-CM | POA: Diagnosis not present

## 2019-04-09 DIAGNOSIS — G20 Parkinson's disease: Secondary | ICD-10-CM | POA: Diagnosis not present

## 2019-04-09 NOTE — Telephone Encounter (Addendum)
Called spoke with patient spouse she was informed on provider response below. She sound very disappointed but understands, she requested that forms be mailed back to her at home address listed in patient chart.  Spouse states that she look up the definition of quadriparesis and he has those symptoms he has weakness in all 4 limbs.  Pt spouse was informed of provider response that " neurologically patient does not meet the medical definition for any of these conditions"   Pt spouse aware and understands

## 2019-04-09 NOTE — Telephone Encounter (Signed)
Pt spouse called back again today requesting that provider review form again she says that patient does have weakness in all 4 limbs which would qualify him for benefits. She states that she look up the definition of quadriparesis and that is what he has.  Pt informed that provider is out of the office today and will return tomorrow.  Message will have to wait until provider returns. She is aware and understands this.

## 2019-04-09 NOTE — Telephone Encounter (Signed)
I understand that she looked up what she thinks is the definition but medically, he does not have quadriparesis.  I cannot fill out this form.

## 2019-04-09 NOTE — Telephone Encounter (Signed)
Called spoke with spouse she is aware and understands.

## 2019-04-09 NOTE — Telephone Encounter (Signed)
Forms mailed back today

## 2019-04-15 DIAGNOSIS — G4733 Obstructive sleep apnea (adult) (pediatric): Secondary | ICD-10-CM | POA: Diagnosis not present

## 2019-04-15 DIAGNOSIS — I872 Venous insufficiency (chronic) (peripheral): Secondary | ICD-10-CM | POA: Diagnosis not present

## 2019-04-15 DIAGNOSIS — F332 Major depressive disorder, recurrent severe without psychotic features: Secondary | ICD-10-CM | POA: Diagnosis not present

## 2019-04-15 DIAGNOSIS — N3281 Overactive bladder: Secondary | ICD-10-CM | POA: Diagnosis not present

## 2019-04-15 DIAGNOSIS — G20 Parkinson's disease: Secondary | ICD-10-CM | POA: Diagnosis not present

## 2019-04-15 DIAGNOSIS — S72002D Fracture of unspecified part of neck of left femur, subsequent encounter for closed fracture with routine healing: Secondary | ICD-10-CM | POA: Diagnosis not present

## 2019-04-16 DIAGNOSIS — S72002D Fracture of unspecified part of neck of left femur, subsequent encounter for closed fracture with routine healing: Secondary | ICD-10-CM | POA: Diagnosis not present

## 2019-04-16 DIAGNOSIS — G20 Parkinson's disease: Secondary | ICD-10-CM | POA: Diagnosis not present

## 2019-04-16 DIAGNOSIS — F332 Major depressive disorder, recurrent severe without psychotic features: Secondary | ICD-10-CM | POA: Diagnosis not present

## 2019-04-16 DIAGNOSIS — G4733 Obstructive sleep apnea (adult) (pediatric): Secondary | ICD-10-CM | POA: Diagnosis not present

## 2019-04-16 DIAGNOSIS — N3281 Overactive bladder: Secondary | ICD-10-CM | POA: Diagnosis not present

## 2019-04-16 DIAGNOSIS — I872 Venous insufficiency (chronic) (peripheral): Secondary | ICD-10-CM | POA: Diagnosis not present

## 2019-04-17 DIAGNOSIS — N3281 Overactive bladder: Secondary | ICD-10-CM | POA: Diagnosis not present

## 2019-04-17 DIAGNOSIS — G4733 Obstructive sleep apnea (adult) (pediatric): Secondary | ICD-10-CM | POA: Diagnosis not present

## 2019-04-17 DIAGNOSIS — F332 Major depressive disorder, recurrent severe without psychotic features: Secondary | ICD-10-CM | POA: Diagnosis not present

## 2019-04-17 DIAGNOSIS — G20 Parkinson's disease: Secondary | ICD-10-CM | POA: Diagnosis not present

## 2019-04-17 DIAGNOSIS — I872 Venous insufficiency (chronic) (peripheral): Secondary | ICD-10-CM | POA: Diagnosis not present

## 2019-04-17 DIAGNOSIS — S72002D Fracture of unspecified part of neck of left femur, subsequent encounter for closed fracture with routine healing: Secondary | ICD-10-CM | POA: Diagnosis not present

## 2019-04-23 ENCOUNTER — Telehealth: Payer: Self-pay

## 2019-04-23 DIAGNOSIS — G20 Parkinson's disease: Secondary | ICD-10-CM

## 2019-04-23 NOTE — Telephone Encounter (Signed)
ORDERS SENT

## 2019-04-23 NOTE — Telephone Encounter (Signed)
Received call from [facility] rehab request new order for PT, OT, ST   Pt spoue called requesting patient continue therapy with them. Patient has completed Home health PT, OT, ST    Requesting 3 individual orders per each therapy.  Ok to send

## 2019-04-23 NOTE — Telephone Encounter (Signed)
yes

## 2019-04-24 DIAGNOSIS — N3281 Overactive bladder: Secondary | ICD-10-CM | POA: Diagnosis not present

## 2019-04-24 DIAGNOSIS — G20 Parkinson's disease: Secondary | ICD-10-CM | POA: Diagnosis not present

## 2019-04-24 DIAGNOSIS — I872 Venous insufficiency (chronic) (peripheral): Secondary | ICD-10-CM | POA: Diagnosis not present

## 2019-04-24 DIAGNOSIS — F332 Major depressive disorder, recurrent severe without psychotic features: Secondary | ICD-10-CM | POA: Diagnosis not present

## 2019-04-24 DIAGNOSIS — G4733 Obstructive sleep apnea (adult) (pediatric): Secondary | ICD-10-CM | POA: Diagnosis not present

## 2019-04-24 DIAGNOSIS — S72002D Fracture of unspecified part of neck of left femur, subsequent encounter for closed fracture with routine healing: Secondary | ICD-10-CM | POA: Diagnosis not present

## 2019-04-25 ENCOUNTER — Other Ambulatory Visit: Payer: Self-pay | Admitting: Neurology

## 2019-04-25 DIAGNOSIS — G20 Parkinson's disease: Secondary | ICD-10-CM | POA: Diagnosis not present

## 2019-04-25 DIAGNOSIS — F332 Major depressive disorder, recurrent severe without psychotic features: Secondary | ICD-10-CM | POA: Diagnosis not present

## 2019-04-25 DIAGNOSIS — N3281 Overactive bladder: Secondary | ICD-10-CM | POA: Diagnosis not present

## 2019-04-25 DIAGNOSIS — G4733 Obstructive sleep apnea (adult) (pediatric): Secondary | ICD-10-CM | POA: Diagnosis not present

## 2019-04-25 DIAGNOSIS — I872 Venous insufficiency (chronic) (peripheral): Secondary | ICD-10-CM | POA: Diagnosis not present

## 2019-04-25 DIAGNOSIS — S72002D Fracture of unspecified part of neck of left femur, subsequent encounter for closed fracture with routine healing: Secondary | ICD-10-CM | POA: Diagnosis not present

## 2019-04-25 NOTE — Telephone Encounter (Signed)
Requested Prescriptions   Pending Prescriptions Disp Refills  . carbidopa-levodopa (SINEMET IR) 25-100 MG tablet [Pharmacy Med Name: CARBIDOPA-LEVODOPA 25-100 TAB] 270 tablet 1    Sig: TAKE 1 TABLET BY MOUTH THREE TIMES A DAY   Rx last filled:11/13/18 #450 1 refills  Pt last seen:01/04/19  Follow up appt scheduled:06/03/19

## 2019-05-07 ENCOUNTER — Telehealth: Payer: Self-pay

## 2019-05-07 ENCOUNTER — Ambulatory Visit: Payer: Self-pay

## 2019-05-07 NOTE — Telephone Encounter (Signed)
Copied from [place] [id number]. Topic: General - Other >> May 06, 2019 12:06 PM Jeri Cos wrote: Reason for CRM: Pt's wife called wanting to let Dr. Sharlet Salina know that she should be receiving an order for a condom catheter by fax either later today or tomorrow. She would like for Dr. Sharlet Salina to sign and date the order and fax it back along with the enclosed cover sheet.

## 2019-05-07 NOTE — Telephone Encounter (Signed)
Incoming call from wife of Patient. Requesting to Dr.  Sharlet Salina to order the form for home care.Patient is  Incontinent must feed him cant walk. Completely altered.  Wife sounds  Desperate. Wife would like to have Dr to please call he back Wife in tears.  Desperate to have Dr. Sharlet Salina to sign form related to home heath care made reference to [place].  Wife awaits return phone call to [phone] from Dr.  Sharlet Salina.  .  This is 2nd request states wife.  [name] [name]

## 2019-05-07 NOTE — Telephone Encounter (Signed)
Called back and informed if there is any way to get patient in here for an appointment an evaluation can be done here but it does have to be a F2F for medicare. Informed that ER is what Dr. Sharlet Salina strongly suggests but if she can get in we can work with her to get him in here and be evaluated. Patients wife was so thankful that we can do this for them and will figure out away to get in either tomorrow or Thursday.

## 2019-05-07 NOTE — Telephone Encounter (Signed)
I really think that ER evaluation is appropriate. They are letting 1 person come with so he would not be alone in the ER. This sudden change is not normal and I have no capacity to evaluate this since she cannot get him out of the house. I need her to take him to ER so that he can be checked out and we can start to find out why this is going on. I do not have a reasonable or good explanation why he is like this.

## 2019-05-07 NOTE — Telephone Encounter (Signed)
If this is new he needs to go to ER for evaluation

## 2019-05-07 NOTE — Telephone Encounter (Signed)
Patient wife is wanting in home health care for husband having these same issues for a while but getting worse as time goes on. Patient wife states she needs help in home she cannot do this by herself anymore. Patient states medicare will not do anything till PCP provider signs off on home health care. Wife states that she is not taking him to the hospital at all unless things get really bad like when she had to in march. Patient wife states cannot help in anymore took an hour to help him to the bathroom states just at the end of her rope needs help

## 2019-05-07 NOTE — Telephone Encounter (Signed)
Called back and wife and patient in background stating are not going to ER that he does not need another evaluation or labs or scans or anything have already had those. That all they need is home health and would like that to be done so she can get help. I tried explaining to patient that there has to be a face to face with in a certain amount of time in order to sign off on anything and that is another reason why we suggest going to the ER since we know she cannot get him here. Patients wife states that they cannot afford a ride over to the hospital and another hospital stay. That the last time he was there for a week and she is not doing that again. They really want to talk with you about it and would like to do a virtual or phone call. Told them I would relay the message

## 2019-05-07 NOTE — Telephone Encounter (Signed)
Would you like to have a telephone visit to discuss changes. I have not received any faxes since 7/30 for patient and they are already signed and have been faxed

## 2019-05-07 NOTE — Telephone Encounter (Signed)
Informed that we have not received any forms since 7/30 and those have been signed and faxed

## 2019-05-07 NOTE — Telephone Encounter (Signed)
I cannot appropriately evaluate him on phone or video. He needs a face to face evaluation with someone given his drastic change in status over the last several months. If she can bring him to office for evaluation we can do this but as she cannot I feel strongly that he needs evaluation ER would be only option. Medicare does not cover long term home health nursing. They only cover this for a short time for an acute condition. Medicaid does cover longer term nursing at home. Most of the times this has to come out of pocket for individuals.

## 2019-05-09 ENCOUNTER — Other Ambulatory Visit (INDEPENDENT_AMBULATORY_CARE_PROVIDER_SITE_OTHER): Payer: Medicare Other

## 2019-05-09 ENCOUNTER — Ambulatory Visit (INDEPENDENT_AMBULATORY_CARE_PROVIDER_SITE_OTHER): Payer: Medicare Other | Admitting: Internal Medicine

## 2019-05-09 ENCOUNTER — Other Ambulatory Visit: Payer: Self-pay

## 2019-05-09 ENCOUNTER — Encounter: Payer: Self-pay | Admitting: Internal Medicine

## 2019-05-09 VITALS — BP 118/78 | HR 81 | Temp 97.7°F | Ht 74.0 in | Wt 300.0 lb

## 2019-05-09 DIAGNOSIS — Z13220 Encounter for screening for lipoid disorders: Secondary | ICD-10-CM

## 2019-05-09 DIAGNOSIS — R5383 Other fatigue: Secondary | ICD-10-CM | POA: Diagnosis not present

## 2019-05-09 DIAGNOSIS — E559 Vitamin D deficiency, unspecified: Secondary | ICD-10-CM | POA: Diagnosis not present

## 2019-05-09 DIAGNOSIS — E538 Deficiency of other specified B group vitamins: Secondary | ICD-10-CM | POA: Diagnosis not present

## 2019-05-09 DIAGNOSIS — K5904 Chronic idiopathic constipation: Secondary | ICD-10-CM | POA: Diagnosis not present

## 2019-05-09 DIAGNOSIS — G20 Parkinson's disease: Secondary | ICD-10-CM | POA: Diagnosis not present

## 2019-05-09 DIAGNOSIS — F339 Major depressive disorder, recurrent, unspecified: Secondary | ICD-10-CM

## 2019-05-09 DIAGNOSIS — R739 Hyperglycemia, unspecified: Secondary | ICD-10-CM

## 2019-05-09 DIAGNOSIS — R443 Hallucinations, unspecified: Secondary | ICD-10-CM

## 2019-05-09 LAB — COMPREHENSIVE METABOLIC PANEL
ALT: 3 U/L (ref 0–53)
AST: 10 U/L (ref 0–37)
Albumin: 4.1 g/dL (ref 3.5–5.2)
Alkaline Phosphatase: 61 U/L (ref 39–117)
BUN: 17 mg/dL (ref 6–23)
CO2: 28 mEq/L (ref 19–32)
Calcium: 9.3 mg/dL (ref 8.4–10.5)
Chloride: 106 mEq/L (ref 96–112)
Creatinine, Ser: 1.22 mg/dL (ref 0.40–1.50)
GFR: 59.47 mL/min — ABNORMAL LOW (ref >60.00)
Glucose, Bld: 104 mg/dL — ABNORMAL HIGH (ref 70–99)
Potassium: 4.2 mEq/L (ref 3.5–5.1)
Sodium: 140 mEq/L (ref 135–145)
Total Bilirubin: 0.5 mg/dL (ref 0.2–1.2)
Total Protein: 6.8 g/dL (ref 6.0–8.3)

## 2019-05-09 LAB — LIPID PANEL
Cholesterol: 141 mg/dL (ref 0–200)
HDL: 51.8 mg/dL (ref >39.00)
LDL Cholesterol: 71 mg/dL (ref 0–99)
NonHDL: 89.51
Total CHOL/HDL Ratio: 3
Triglycerides: 94 mg/dL (ref 0.0–149.0)
VLDL: 18.8 mg/dL (ref 0.0–40.0)

## 2019-05-09 LAB — CBC
HCT: 38.2 % — ABNORMAL LOW (ref 39.0–52.0)
Hemoglobin: 12.8 g/dL — ABNORMAL LOW (ref 13.0–17.0)
MCHC: 33.4 g/dL (ref 30.0–36.0)
MCV: 90.1 fl (ref 78.0–100.0)
Platelets: 332 10*3/uL (ref 150.0–400.0)
RBC: 4.24 Mil/uL (ref 4.22–5.81)
RDW: 14.8 % (ref 11.5–15.5)
WBC: 6.5 10*3/uL (ref 4.0–10.5)

## 2019-05-09 LAB — VITAMIN B12: Vitamin B-12: 777 pg/mL (ref 211–911)

## 2019-05-09 LAB — TSH: TSH: 1.06 u[IU]/mL (ref 0.35–4.50)

## 2019-05-09 LAB — VITAMIN D 25 HYDROXY (VIT D DEFICIENCY, FRACTURES): VITD: 14.83 ng/mL — ABNORMAL LOW (ref 30.00–100.00)

## 2019-05-09 LAB — T4, FREE: Free T4: 0.91 ng/dL (ref 0.60–1.60)

## 2019-05-09 LAB — HEMOGLOBIN A1C: Hgb A1c MFr Bld: 6.1 % (ref 4.6–6.5)

## 2019-05-09 LAB — LIPASE: Lipase: 6 U/L — ABNORMAL LOW (ref 11.0–59.0)

## 2019-05-09 NOTE — Progress Notes (Signed)
Subjective:   Patient ID: [name] [name], male    DOB: 1951/07/10, 68 y.o.   MRN: [id number]  HPI The patient is a 68 YO man coming in for concerns about his overall health. Off and on since April he has had episodes of confusion and hallucinations. We have had several video visits about this and history was limited. It has been very unclear what is going on with these episodes. His wife has contacted his neurologist many times without getting any answers and for some reason they have never just offered her a visit about this and she has not asked for a visit. We have checked his urine several times during episodes without infection. Labs done during one spell back in May did not reveal any abnormalities. He is currently not in an episode. He was able to walk with walker into the car and then wheelchair into the building. He is able to speak normally. He does not recall a lot about these episodes. His wife clarifies that she can usually tell one is coming on as he just sits and stares at the onset. Then he gets more tremors and is confused for some time afterwards. He does have hallucinations which are visual and auditory while he is confused. Due to the severe tremor he is not able to feed himself. He cannot get to the restroom. He is having condom catheter on all the time even today. He denies pain or burning with urination. Denies appetite changes. His wife has stopped all aspertame containing products and she feels that this has made a small positive change with episodes not lasting as long. His gaze is not deviated during episodes. They have not contacted his psychiatrist to talk to them about these spells as his wife feels that is not bad as he is only on wellbutrin and celexa for this currently. He denies depression or anxiety today. His wife is at the end of her rope as she needs home health services and has called medicare a lot of times during this and feels he needs home health long term. She  is unable to care for him long term with these episodes as they are quite often now. He may go 1-2 days between episodes or it could be a week or two. Not predictable. He does not complain of stomach or chest pain during episodes. No diarrhea during episodes. He struggles with constipation long term but this is not changed recently.   PMH, [facility], social history reviewed and updated  Review of Systems  Constitutional: Positive for activity change and fatigue. Negative for appetite change, chills, fever and unexpected weight change.  HENT: Negative.   Eyes: Negative.   Respiratory: Negative.   Cardiovascular: Negative.   Gastrointestinal: Positive for constipation. Negative for abdominal distention, abdominal pain, anal bleeding, blood in stool, diarrhea, nausea, rectal pain and vomiting.  Genitourinary: Negative.   Musculoskeletal: Negative.   Skin: Negative.   Neurological: Positive for tremors and weakness. Negative for dizziness, seizures, syncope, light-headedness, numbness and headaches.  Psychiatric/Behavioral: Positive for confusion, decreased concentration, hallucinations and sleep disturbance. Negative for self-injury and suicidal ideas. The patient is not nervous/anxious.     Objective:  Physical Exam Constitutional:      Appearance: He is well-developed. He is obese.  HENT:     Head: Normocephalic and atraumatic.  Neck:     Musculoskeletal: Normal range of motion.  Cardiovascular:     Rate and Rhythm: Normal rate and regular rhythm.  Pulmonary:  Effort: Pulmonary effort is normal. No respiratory distress.     Breath sounds: Normal breath sounds. No wheezing or rales.  Abdominal:     General: Bowel sounds are normal. There is no distension.     Palpations: Abdomen is soft.     Tenderness: There is no abdominal tenderness. There is no rebound.  Skin:    General: Skin is warm and dry.  Neurological:     Mental Status: He is alert and oriented to person, place, and time.       Coordination: Coordination abnormal.  Psychiatric:     Comments: Wife does most of talking but patient is appropriate with answers and thought process during today's visit     Vitals:   05/09/19 1500  BP: 118/78  Pulse: 81  Temp: 97.7 F (36.5 C)  TempSrc: Oral  SpO2: 95%  Weight: 300 lb (136.1 kg)  Height: 6\' 2"  (1.88 m)    Assessment & Plan:  Visit time 40 minutes: greater than 50% of that time was spent in face to face counseling and coordination of care with the patient: counseled about course, etiology, lack of understanding of these episodes due to no evaluation during episode to help narrow etiology

## 2019-05-09 NOTE — Patient Instructions (Signed)
We will check the blood work today.   If he has another episode take him to the hospital so they can check him out to see if he is having seizures or what the cause is.

## 2019-05-10 ENCOUNTER — Other Ambulatory Visit: Payer: Self-pay | Admitting: Internal Medicine

## 2019-05-10 MED ORDER — VITAMIN D (ERGOCALCIFEROL) 1.25 MG (50000 UNIT) PO CAPS: 50000.0000 [IU] | ORAL_CAPSULE | ORAL | 0 refills | Status: DC

## 2019-05-10 NOTE — Assessment & Plan Note (Addendum)
Checking HgA1c for ruling out progression to diabetes.

## 2019-05-10 NOTE — Assessment & Plan Note (Signed)
Have asked them to contact his psychiatrist as hallucinations can often have a psychiatric cause. He has not had medication change recently but did have delirium after surgery this spring which could have triggered some change emotionally.

## 2019-05-10 NOTE — Assessment & Plan Note (Signed)
Wife admits to visit with neurology at the end of the month and agree with this although I would rather this was sooner. Also asked her to contact his psychiatrist to see if they can offer any explanations. Explained to her the importance of seeking care via EMS immediately when he has an episode as we need to find out what these episodes are which means having an evaluation during an episode with potential EEG, imaging, labs, urine studies and not days to weeks later when the cause may not be apparent. Prior CT head without acute changes but we discussed that dementia does not show up on CT scan as his wife was convinced that he did not have dementia because CT scan was normal. Due to the nature and length of the visit and history taking MMSE was unable to be performed today. I do still have concerns that he could be having absence seizures (staring spells) at the onset of his "episodes" and needs EEG during episode to rule this out. An extended post ictal period could explain some of his behavior during an episode but there is not adequate evaluation. I spent significant time explaining to his wife that if we cannot assess the source of these episodes we will not be able to attempt to lessen the amount or frequency of these episodes and these episodes are what is causing her burn out as caregiver. Referral to home health today for weakness but also spent a significant amount of time counseling her on the fact that medicare does not cover or provide long term care and if her husband's condition is not treatable and is long term she will need to work on other arrangements for his ongoing care.

## 2019-05-10 NOTE — Assessment & Plan Note (Signed)
Asked them to have visit with their neurologist to see if there is an avenue to explore of neurologic cause or contributor to these episodes. Several of his medications have hallucinations as side effect listed but it is unclear to me how often this occurs and if the description could match that or not.

## 2019-05-10 NOTE — Assessment & Plan Note (Signed)
Stable, abdomen exam benign.

## 2019-05-16 NOTE — Telephone Encounter (Signed)
Pt's wife calling and want to know the status of the request for 20 more condom catheter. The request was sent in on monday by [employer] Fax # [phone]

## 2019-05-16 NOTE — Telephone Encounter (Signed)
Tried calling was on hold forever faxed them to re-send the forms to side A fax machine

## 2019-05-17 ENCOUNTER — Telehealth: Payer: Self-pay

## 2019-05-17 NOTE — Telephone Encounter (Signed)
Noted  

## 2019-05-17 NOTE — Telephone Encounter (Signed)
fyi

## 2019-05-17 NOTE — Telephone Encounter (Signed)
Copied from [place] [id number]. Topic: General - Other >> May 17, 2019 11:42 AM Rainey Pines A wrote: Ester Rink from [facility] called to inform provider that they have a delay for start of care and can see patient August 21st due to staffing. Ester Rink can be reached [phone]

## 2019-05-20 ENCOUNTER — Other Ambulatory Visit: Payer: Self-pay

## 2019-05-20 ENCOUNTER — Telehealth: Payer: Self-pay | Admitting: Neurology

## 2019-05-20 ENCOUNTER — Telehealth (INDEPENDENT_AMBULATORY_CARE_PROVIDER_SITE_OTHER): Payer: Medicare Other | Admitting: Neurology

## 2019-05-20 VITALS — Ht 74.0 in | Wt 300.0 lb

## 2019-05-20 DIAGNOSIS — G20 Parkinson's disease: Secondary | ICD-10-CM | POA: Diagnosis not present

## 2019-05-20 DIAGNOSIS — R410 Disorientation, unspecified: Secondary | ICD-10-CM | POA: Diagnosis not present

## 2019-05-20 DIAGNOSIS — R441 Visual hallucinations: Secondary | ICD-10-CM

## 2019-05-20 NOTE — Telephone Encounter (Signed)
Got in contact with [employer] [phone] and they are re-faxing the second order for the condom catheters to fax up front will keep a look out for form.

## 2019-05-20 NOTE — Telephone Encounter (Signed)
Wife is calling in and cant even get him up because he can not walk at all. He is getting worse. She is having to cancel his NeuroPT. She said his delusions and hallucinations are all the time now. She is wanting to try to increase his medication. Thanks!

## 2019-05-20 NOTE — Telephone Encounter (Signed)
Noted She would like to still keep appt for 06/03/19 its scheduled as a virtual visit but is requesting to come in the office. She states if she can move the appt to 10:00 on 06/03/19 she has someone willing to help get him to the office but can only come at 10:00 am.  Ok to use the NEW PATIENT slot for him

## 2019-05-20 NOTE — Telephone Encounter (Signed)
Called spoke with patient spouse she says she is unable to bring him in the office. He is to stiff/ can not move she has no one at home to help get him to the car or up. She states it took him 1 hour just to get him a bath.    Perry for virtual visit at 2:00pm? [email or web address]

## 2019-05-20 NOTE — Telephone Encounter (Signed)
Wife said she is unable to get him out of the house is there anyway they can do a virtual or telephone visit today at 2?

## 2019-05-20 NOTE — Telephone Encounter (Signed)
I will see him today at 2 virtually if this is all she/they can do.  I was just trying to give him the best options for his health care.

## 2019-05-20 NOTE — Progress Notes (Signed)
Virtual Visit via Video Note The purpose of this virtual visit is to provide medical care while limiting exposure to the novel coronavirus.    Consent was obtained for video visit:  Yes.   Answered questions that patient had about telehealth interaction:  Yes.   I discussed the limitations, risks, security and privacy concerns of performing an evaluation and management service by telemedicine. I also discussed with the patient that there may be a patient responsible charge related to this service. The patient expressed understanding and agreed to proceed.  Pt location: Home Physician Location: office Name of referring provider:  [name], * I connected with Therisa Doyne Yanko at patients initiation/request on 05/20/2019 at  2:00 PM EDT by video enabled telemedicine application and verified that I am speaking with the correct person using two identifiers. Pt MRN:  [id number] Pt DOB:  1951-07-27 Video Participants:  Tommie Raymond A Vasudevan;  Wife supplements hx and both daughters are present and supplement hx as well   History of Present Illness:  Patient seen today in follow-up for Parkinson's disease.  He was worked into my schedule today.  He was offered an in person visit, but this was declined by his wife.  Patient's wife called this morning stating that patient could not move well and it took her nearly an hour to get him out of bed.  She was asking if we could just increase his medication.  I did not want to do that, given the fact that he has had confusion and hallucinations since our last visit.  Therefore, we scheduled this visit emergently.   Prior medical records are also reviewed.  About 4 weeks after I last saw him in April, they called me with confusion and hallucinations.  He has also been following with his primary care physician.  I discontinued his entacapone on June 23 because of his confusion and hallucinations.  His counselor felt that perhaps he was experiencing PTSD  from his fall.  They last saw Dr. Adele Schilder in June but this was just a phone visit.   It appears that they did discuss hallucinations, but it also appears that they were somewhat better at the time and no med changes were made.  I have reviewed those notes.  Family noting visual hallucinations.  He saw Carlye Grippe in the back yard and didn't know it was not real until he was told.  He has also had conversations with his father who is now deceased.  He is reaching out for things in the air.    pts wife states that this confusion seemed to initially start in episodes but now it is more continuous (been more confused for the last 1.5 weeks).   He is still on carbidopa/levodopa 25/100, 2 tablets in the morning, 2 in the afternoon and 1 in the evening.  He is on ropinirole, 0.5 mg 3 times per day.  His family states that he really is not walking, unless his family physically helps him out of the chair.   Current Outpatient Medications on File Prior to Visit  Medication Sig Dispense Refill  . B Complex-C (B-COMPLEX WITH VITAMIN C) tablet Take 1 tablet by mouth daily.    Marland Kitchen buPROPion (WELLBUTRIN XL) 150 MG 24 hr tablet Take 1 tablet (150 mg total) by mouth daily. 90 tablet 0  . buPROPion (WELLBUTRIN XL) 300 MG 24 hr tablet Take 1 tablet (300 mg total) by mouth every morning. 90 tablet 0  . carbidopa-levodopa (SINEMET IR)  25-100 MG tablet Take 1-2 tablets by mouth See admin instructions. Take 2 tabs at 7am, Take 2 tabs at 11 am, and Take 1 tabs at 4 pm. 450 tablet 1  . citalopram (CELEXA) 40 MG tablet Take 1 tablet (40 mg total) by mouth daily. 90 tablet 0  . fesoterodine (TOVIAZ) 4 MG TB24 tablet Take 1 tablet (4 mg total) by mouth daily. (Patient taking differently: Take 8 mg by mouth daily. ) 30 tablet 0  . Omega 3-6-9 Fatty Acids (OMEGA 3-6-9 PO) Take 1 capsule by mouth daily.     Marland Kitchen rOPINIRole (REQUIP) 1 MG tablet Take 1 tablet (1 mg total) by mouth 3 (three) times daily. 270 tablet 1  . TURMERIC CURCUMIN PO  Take 1 capsule by mouth daily.      No current facility-administered medications on file prior to visit.      Observations/Objective:   Vitals:   05/20/19 1339  Weight: 300 lb (136.1 kg)  Height: 6\' 2"  (1.88 m)   GEN:  The patient appears stated age and is in NAD.  He was cooperative for the exam and followed commands.  Neurological examination:  Orientation: The patient is alert and oriented to person and place.  He also knew who this examiner was.  However, he also talked about things that were not with him in the realm of reality (discussed his brother being present at some time, and his wife told me that his brother is deceased). Cranial nerves: There is good facial symmetry. 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  The speech is fluent and clear. Soft palate rises symmetrically and there is no tongue deviation. Hearing is intact to conversational tone. Motor: Strength is at least antigravity x 4.   Shoulder shrug is equal and symmetric.  There is no pronator drift.  Movement examination: Tone: unable Abnormal movements: None seen Coordination:  There is no decremation with RAM's, with hand opening and closing, finger taps or the action of turning in a light bulb. Gait and Station: Family states that he would not be able to walk, even with a walker.    Chemistry      Component Value Date/Time   NA 140 05/09/2019 1540   NA 139 02/08/2019   K 4.2 05/09/2019 1540   CL 106 05/09/2019 1540   CO2 28 05/09/2019 1540   BUN 17 05/09/2019 1540   BUN 19 02/08/2019   CREATININE 1.22 05/09/2019 1540   GLU 75 02/08/2019      Component Value Date/Time   CALCIUM 9.3 05/09/2019 1540   ALKPHOS 61 05/09/2019 1540   AST 10 05/09/2019 1540   ALT 3 05/09/2019 1540   BILITOT 0.5 05/09/2019 1540     Lab Results  Component Value Date   WBC 6.5 05/09/2019   HGB 12.8 (L) 05/09/2019   HCT 38.2 (L) 05/09/2019   MCV 90.1 05/09/2019   PLT 332.0 05/09/2019   Lab Results  Component Value  Date   [id number] 786 05/09/2019   Lab Results  Component Value Date   TSH 1.06 05/09/2019       Assessment and Plan:   1. Parkinsonism -Decrease ropinirole from 1 mg 3 times per day to 0.5 mg 3 times per day for the next week and then, if doing okay, will decrease it to 0.5 mg twice per day the following week.  I have an appointment with him in 2 weeks and will address this further. -Slightly increase carbidopa/levodopa 25/100, 2 tablets 3 times per day.  2.  Confusion with MS change  -I am not sure all of this is related primarily to Parkinsons disease, although it could be.  -I discontinued his entacapone when this started.  This did not seem to change his mental status.  Making the above changes today.  -I strongly recommended follow-up with psychiatry.  The patient has a very long history of depression, and I am worried about depression related psychosis.  He has had many neurocognitive evaluations now which haven't suggested dementia and it would be strange for symptoms of dementia to come on suddenly.  Wife is going to call psychiatry today.  We will do other tests to make sure that we are not missing anything  -we will do an EEG if wife able to get him to office.  If neg, we will do an ambulatory EEG  -discussed lumbar puncture to r/o other sources of encephalopathy.  Decided to hold on that right now since we are doing other things in the work-up, but this is still within the realm of possibilities if family would like to proceed.  -Discussed that we could try Nuplazid if the above changes do not work.  I do not want to make that many changes all at once.  Family agreed.  -wife is overwhelmed and discussed that she is having trouble taking care of him in the home.  Discussed that I would certainly support her looking into alternative living situations.  Finances are an issue, and she stated that he would need to have Medicaid and she was not sure they would  qualify.  I told her that the only way to know is to start the process. 3 Depression -Despite denial, I continue to think that depression and mood drive his feelings of not doing well physically.  Neurocognitive testing by 2 different neuropsychologists (Drs. 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 We talked about treatments. The patient is not a candidate for oral anticholinergic therapy because of increased risk of confusion and falls. We discussed Botox (type A and B) and 1% atropine drops. We discusssed that candy like lemon drops can help by stimulating mm of the oropharynx to induce swallowing. He doesn't want to pursue these options 5. Dizziness and occasional Orthostatic hypotension -drinking plenty of fluid and doing well with that. -has abdominal compression binder but isn't using.  6. Memory loss -Patient has twice had neurocognitive testing, the last of which was in December 2017, and then had a shorter evaluation in the hospital in 12/2018. I have not seen any significant cognitive decline, despite continued complaints. Last testing Dr. Si Raider felt that some of the issues related to the patient's personality of seeing the world in a negative light. I tend to agree with this.  7. Urinary incontinence, frequency and nocturia -f/u with urology   Follow Up Instructions:  has appt in 2 weeks with me   -I discussed the assessment and treatment plan with the patient. The patient was provided an opportunity to ask questions and all were answered. The patient agreed with the plan and demonstrated an understanding of the instructions.   The patient was advised to call back or seek an in-person evaluation if the symptoms worsen or if the condition fails to improve as anticipated.    Total Time spent in visit with the patient was:  25 min, of which  more than 50% of the time was spent in counseling, as above.   Pt understands  and agrees with the plan of care outlined.  This did not include the 35 min of record review which was detailed above, which was non face to face time.    Alonza Bogus, DO

## 2019-05-20 NOTE — Telephone Encounter (Signed)
Spoke with spouse she states that she can not afford to pay for medical transportation. She started crying saying she does not konw what else she can do for him. She was informed of provider response below also about transferring his care to Nps that does house calls. She then says "does she not want to take care of him anymore." [name] [name] was informed that transferring his care would be beneficial to getting someone that does house calls since he is unable to leave the home. She states that she doesn't have any help put will call around to see if their is someone that can assist with getting him in the office. She will call back to cancel if she can not find help getting him to the office.  Unable to afford home health aide out of pocket/ insurance will not cover

## 2019-05-20 NOTE — Telephone Encounter (Signed)
Can she bring him in for an appointment at 2pm today?

## 2019-05-20 NOTE — Telephone Encounter (Signed)
I need to see him in person, to feel his tone.  I know she wants a med increase but increasing his med can potentially worsen confusion/hallucinations, which I understand have not been good lately.  Any way to get medical transport?  How will she get him here in the future?  If unable to come, perhaps we need to transfer care to NP's that make house calls so that he can be seen in person.  Trying to figure out best solution for him.

## 2019-05-20 NOTE — Addendum Note (Signed)
Addended by: Ranae Plumber on: 05/20/2019 04:25 PM   Modules accepted: Orders

## 2019-05-21 ENCOUNTER — Telehealth: Payer: Self-pay | Admitting: Neurology

## 2019-05-21 ENCOUNTER — Telehealth (HOSPITAL_COMMUNITY): Payer: Self-pay

## 2019-05-21 DIAGNOSIS — F332 Major depressive disorder, recurrent severe without psychotic features: Secondary | ICD-10-CM

## 2019-05-21 MED ORDER — OLANZAPINE 5 MG PO TABS: ORAL_TABLET | ORAL | 0 refills | Status: DC

## 2019-05-21 NOTE — Telephone Encounter (Signed)
Unfortunately, that is a NP slot.  I'm glad I was able to emergently work him in on a same day appointment yesterday (which was offered in person) but I can't move him that day to a new patient appointment

## 2019-05-21 NOTE — Telephone Encounter (Signed)
I returned patient's wife phone call.  She is very nervous and concerned about her husband's health who has been deteriorating in past few days.  She told patient is been talking to himself and grasping that things in the air.  He is seeing things and delusional and up all night and very restless.  She told that husband is not able to move and she had a hard time dealing with the situation.  Patient had Parkinson disease and she had a virtual visit with his neurologist yesterday.  She was told that it could be worsening of the depression with psychosis.  His Parkinson medication dose were adjusted and had a follow-up ointment in 2 weeks and plan to do more test.  I discussed with patient's wife who admitted that he has been more isolated and withdrawn and not sure what triggered his symptoms in past few days.  We discussed that adding antipsychotic medication may cause worsening of Parkinson symptoms including tremors, shakes and fall.  However she like to try because she cannot handle the current condition.  In the past he had tried Abilify but felt it because worsening of the depression.  Patient has dementia.  Recommend to try low-dose Zyprexa 2.5 mg to 5 mg at bedtime to target insomnia, psychosis.  However reminded that Zyprexa can cause metabolic syndrome, increased tremors, shakes and risk of fall and she has to watch carefully about her balance.  We also talked about exploring the idea of nursing home or assisted living facility.  We will continue current dose of Celexa and Wellbutrin for now.  I also discussed if symptoms started to get worse and if she feels that he is aggressive and violent then should call 911 and taken to the hospital.  She was also recommended to call us back if current treatment plan does not work and symptoms started to get worse.  I will call Zyprexa 5 mg to take half to 1 tablet as needed at bedtime for insomnia and psychosis.

## 2019-05-21 NOTE — Telephone Encounter (Signed)
Pt was seen yesterday as virtual visit Follow Up Instructions: has appt in 2 weeks with me              -I discussed the assessment and treatment plan with the patient. The patient was provided an opportunity to ask questions and all were answered. The patient agreed with the plan and demonstrated an understanding of the instructions.  The patient was advised to call back or seek an in-person evaluation if the symptoms worsen or if the condition fails to improve as anticipated.  See other note Pt spouse can do in person but only at 10:00 am when she has someone to help her get him here. Ok to move him to the 10:15 am slot on 06/03/19?

## 2019-05-21 NOTE — Telephone Encounter (Signed)
Spoke with spouse she is aware will keep visit as virtual visit.

## 2019-05-21 NOTE — Telephone Encounter (Signed)
Patients wife called this morning, patient had an appointment yesterday with his Neurologist Dr. Carles Collet, (see note in chart) patients wife is very concerned and would like to speak with you about this appointment

## 2019-05-21 NOTE — Telephone Encounter (Signed)
Patient's wife is calling in about a schedule change she said she talked to you about and you were supposed to let her know. Do we need to do something with his appt on the 31st? Did whatever she is talking about get approved? Thanks!

## 2019-05-22 ENCOUNTER — Ambulatory Visit: Payer: Medicare Other | Admitting: Physical Therapy

## 2019-05-22 ENCOUNTER — Ambulatory Visit: Payer: Medicare Other | Admitting: Occupational Therapy

## 2019-05-22 ENCOUNTER — Ambulatory Visit: Payer: Medicare Other

## 2019-05-23 ENCOUNTER — Telehealth: Payer: Self-pay | Admitting: Neurology

## 2019-05-23 NOTE — Telephone Encounter (Signed)
I got notification from staff that pt couldn't come in to do EEG and have noted that they have changed appt in 2 weeks to virtual.  I need to see the patient in person.  I notice that he has seen PCP in person but doesn't seem to be able to get to [place] in person.  I can't provide good care by only seeing him virtually and can't do EEG if he cannot come in to the office.  If they are able to come in to the appointment, then we can try and do the EEG on same day as appointment.

## 2019-05-24 ENCOUNTER — Telehealth: Payer: Self-pay | Admitting: Neurology

## 2019-05-24 NOTE — Telephone Encounter (Signed)
~  [name] [name]  I think you was addressing this. Last time I spoke with patient spouse was on 05/21/19 concerning her virtual visit Has EEG been addressed with patient spouse

## 2019-05-24 NOTE — Telephone Encounter (Signed)
Note that we were able to work with pts wife today and work him into appt on Tuesday for in person appt and EEG testing same day.

## 2019-05-24 NOTE — Telephone Encounter (Signed)
Please call pt/wife.  Unfortunately, wife stated that she cannot get pt in for appointment and I am worried about him.  I don't feel that I can care for him over the phone or in a virtual visit adequately.  She was able to get him to PCP office but said she cannot get him to our office.  Worried that we could be missing diagnosis and not treating properly.  We offered to transfer care to docs making house calls (generally NP's who go into the home) but wife declined.  She cannot get him into the office for the EEG.  I know that she was considering SNF and certainly a doc could look at him there, but I am sure that I cannot take care of him with only virtual visits.  She has declined medical transport due to cost.

## 2019-05-24 NOTE — Telephone Encounter (Signed)
Yes I informed her that he needs to come in to do EEG. I was looking into home EEG but that is for ambulatory. I will call the spouse to let her know that the EEG must be done in person. And if an ambulatory EEG is warranted Dr. Carles Collet may order that as an in home test.

## 2019-05-25 DIAGNOSIS — Z96642 Presence of left artificial hip joint: Secondary | ICD-10-CM | POA: Diagnosis not present

## 2019-05-25 DIAGNOSIS — G4733 Obstructive sleep apnea (adult) (pediatric): Secondary | ICD-10-CM | POA: Diagnosis not present

## 2019-05-25 DIAGNOSIS — K5904 Chronic idiopathic constipation: Secondary | ICD-10-CM | POA: Diagnosis not present

## 2019-05-25 DIAGNOSIS — N3281 Overactive bladder: Secondary | ICD-10-CM | POA: Diagnosis not present

## 2019-05-25 DIAGNOSIS — G20 Parkinson's disease: Secondary | ICD-10-CM | POA: Diagnosis not present

## 2019-05-25 DIAGNOSIS — F332 Major depressive disorder, recurrent severe without psychotic features: Secondary | ICD-10-CM | POA: Diagnosis not present

## 2019-05-25 DIAGNOSIS — I872 Venous insufficiency (chronic) (peripheral): Secondary | ICD-10-CM | POA: Diagnosis not present

## 2019-05-25 DIAGNOSIS — E559 Vitamin D deficiency, unspecified: Secondary | ICD-10-CM | POA: Diagnosis not present

## 2019-05-25 DIAGNOSIS — E538 Deficiency of other specified B group vitamins: Secondary | ICD-10-CM | POA: Diagnosis not present

## 2019-05-25 DIAGNOSIS — Z9181 History of falling: Secondary | ICD-10-CM | POA: Diagnosis not present

## 2019-05-27 NOTE — Progress Notes (Signed)
[name] [name] was seen today in follow up for Parkinsons disease.  Wife accompanies him and supplements history.  I just saw him via telemedicine on August 17.  At that point, I decreased his ropinirole from 1 mg 3 times per day to 0.5 mg 3 times per day for 1 week, and he is currently on 0.5 mg twice per day (started that today).  He is on carbidopa/levodopa 25/100, 2 tablets 3 times per day.  This was a slight increase after our last visit.  I did ask his wife to call the psychiatrist to get an appointment after our last visit.  She did call the psychiatrist.  I did review that phone call.  The patient has not been seen.  He was started on Zyprexa.  Wife states that the changes that have been made are really helping.  Hallucinations and delusions are resolved.  He was supposed to have an EEG last week, but his wife declined the visit, stating that she could not get him in here.  He did have the EEG immediately prior to seeing me today.   PREVIOUS MEDICATIONS: Entacapone; ropinirole; levodopa  ALLERGIES:  No Known Allergies  CURRENT MEDICATIONS:  Outpatient Encounter Medications as of 05/28/2019  Medication Sig  . B Complex-C (B-COMPLEX WITH VITAMIN C) tablet Take 1 tablet by mouth daily.  Marland Kitchen buPROPion (WELLBUTRIN XL) 150 MG 24 hr tablet Take 1 tablet (150 mg total) by mouth daily.  Marland Kitchen buPROPion (WELLBUTRIN XL) 300 MG 24 hr tablet Take 1 tablet (300 mg total) by mouth every morning.  . carbidopa-levodopa (SINEMET IR) 25-100 MG tablet Take 1-2 tablets by mouth See admin instructions. Take 2 tabs at 7am, Take 2 tabs at 11 am, and Take 1 tabs at 4 pm. (Patient taking differently: Take 1-2 tablets by mouth See admin instructions. Take 2 tabs at 7am, Take 2 tabs at 11 am, and Take 2 tabs at 4 pm.)  . citalopram (CELEXA) 40 MG tablet Take 1 tablet (40 mg total) by mouth daily.  . fesoterodine (TOVIAZ) 4 MG TB24 tablet Take 1 tablet (4 mg total) by mouth daily. (Patient taking differently: Take 8 mg  by mouth daily. )  . [name] (ZYPREXA) 5 MG tablet Take 1/2 to one tab at bed time for psychosis  . Omega 3-6-9 Fatty Acids (OMEGA 3-6-9 PO) Take 1 capsule by mouth daily.   Marland Kitchen rOPINIRole (REQUIP) 1 MG tablet Take 1 tablet (1 mg total) by mouth 3 (three) times daily. (Patient taking differently: Take 1 mg by mouth 2 (two) times daily. 1/2 tablet at 7 am, 1/2 at 4pm)  . TURMERIC CURCUMIN PO Take 1 capsule by mouth daily.    No facility-administered encounter medications on file as of 05/28/2019.     PAST MEDICAL HISTORY:   Past Medical History:  Diagnosis Date  . APPENDECTOMY, HX OF 02/18/2008   Qualifier: Diagnosis of  By: Danny Lawless CMA, [place]    . Cardiac murmur    as a child  . Chronic idiopathic constipation   . Depression   . Headache(784.0)   . HEADACHES, HX OF 02/18/2008   Qualifier: Diagnosis of  By: Danny Lawless CMA, [place]    . Parkinson disease ([place]) 11/2014  . Sinus mucosal thickening    pt unable to lay flat  . Streptococcal meningitis    as an infant    PAST SURGICAL HISTORY:   Past Surgical History:  Procedure Laterality Date  . APPENDECTOMY  1967  . COLONOSCOPY    .  NASAL SINUS SURGERY     x 4 as a child  . 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 Marital status: Married    Spouse name: maggie  . Number of children: 3  . Years of education: Not on file  . Highest education level: Bachelor's degree (e.g., BA, AB, BS)  Occupational History  . 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 Financial resource strain: Not on file  . Food insecurity    Worry: Not on file    Inability: Not on file  . Transportation needs    Medical: Not on file    Non-medical: Not on file  Tobacco Use  . Smoking  status: Never Smoker  . Smokeless tobacco: Never Used  Substance and Sexual Activity  . Alcohol use: No    Alcohol/week: 0.0 standard drinks  . Drug use: No  . Sexual activity: Yes    Birth control/protection: None  Lifestyle  . Physical activity    Days per week: Not on file    Minutes per session: Not on file  . Stress: Not on file  Relationships  . Social Herbalist on phone: Not on file    Gets together: Not on file    Attends religious service: Not on file    Active member of club or organization: Not on file    Attends meetings of clubs or organizations: Not on file    Relationship status: Not on file  . Intimate partner violence    Fear of current or ex partner: Not on file    Emotionally abused: Not on file    Physically abused: Not on file    Forced sexual activity: Not on file  Other Topics Concern  . Not on file  Social History Narrative   [place] partner pt lives at home with spouse Chancy Hurter, has 3 Childrens   Right handed       FAMILY HISTORY:   Family Status  Relation Name Status  . Father  Deceased       lung cancer, alzheimer's  . Mother  Alive       HTN, A fib  . Brother  Deceased       accident  . Sister  Alive       healthy  . Son  Alive       healthy  . Daughter  Alive       healthy  . Daughter  Alive       healthy  . Mat Aunt  Alive       Parkinson's Disease  . Neg Hx  (Not Specified)    ROS:  Review of Systems  Constitutional: Positive for malaise/fatigue.  HENT: Negative.   Eyes: Negative.   Respiratory: Negative.   Cardiovascular: Negative.   Gastrointestinal: Negative.   Skin: Negative.   Neurological: Positive for speech change.  Psychiatric/Behavioral: Positive for memory loss.    PHYSICAL EXAMINATION:    VITALS:   Vitals:   05/28/19 1118  BP: 112/68  Pulse: 81  SpO2: 99%  Weight: 295 lb (133.8 kg)    GEN:  The patient appears stated age and is in NAD. HEENT:  Normocephalic, atraumatic.  The mucous  membranes are moist. The superficial temporal arteries are without ropiness or tenderness. CV:  RRR Lungs:  CTAB Neck/HEME:  There are no carotid bruits bilaterally.  Neurological examination:  Orientation: The patient is alert and oriented x3. Cranial nerves: There is good facial symmetry with facial hypomimia. The speech is fluent and clear. Soft palate rises symmetrically and there is no tongue deviation. Hearing is intact to conversational tone. Sensation: Sensation is intact to light touch throughout Motor: Strength is at least antigravity x4.  Movement examination: Tone: There is normal tone in the upper and lower extremities. Abnormal movements: No tremor today. Coordination:  There is slowness with all rapid alternating movements, but no real decremation. Gait and Station: The patient requires assistance out of the chair.  He ambulates alone with his walker.  He is slow, but able to ambulate on his own with a walker.  He is very slow in the turns.   ASSESSMENT/PLAN:  1. Parkinsonism -In 1 week, decrease ropinirole 0.5 mg from twice per day, to 0.5 mg once per day for a week and then discontinue the medication. -Continue carbidopa/levodopa 25/100, 2 tablets 3 times per day.  -Discussed intentional goalsetting with the patient and wife.  Discussed getting back to home physical therapy.  Patients wife states that primary care physician has just ordered home physical therapy.  Patient asked about speech therapy and I told him to ask the home therapist about adding that on.  We discussed slowly getting back to online rock steady boxing as well as the prior ACT program that he was previously participating in. 2.  Confusion with MS change             -I do not think that all this is related to Parkinson's disease.  CT brain unremarkable.  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  He is getting better on antipsychotics.              -EEG results are pending.  Had this done immediately prior to today's visit.  Suspect that it may be somewhat slow, but doubt epileptiform.             -wife is overwhelmed and discussed that she is having trouble taking care of him in the home.  Discussed that I would certainly support her looking into alternative living situations.  Finances are an issue, and she stated that he would need to have Medicaid and she was not sure they would qualify.  I told her that the only way to know is to start the process. 3 Depression -I continue to worry about depression related psychosis.  Psychiatry just placed him on Zyprexa.  Would have preferred Nuplazid, given that Zyprexa can significantly worsen parkinsonian symptoms.  Nonetheless, he is doing much better on Zyprexa than he was.  We will need to closely watch his parkinsonian symptoms.  Discussed with patient and wife.  They expressed understanding. 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 We talked about treatments. The patient is not a candidate for oral anticholinergic therapy because of increased risk of confusion and falls. We discussed Botox (type A and B) and 1% atropine drops. We discusssed that candy like lemon drops can help by stimulating mm of the oropharynx to induce swallowing. He doesn't want to pursue these options 5. Dizziness and occasional Orthostatic hypotension -drinking plenty of fluid and doing well with that. -has abdominal compression binder but isn't using.  6.  Memory loss -Patient has twice had neurocognitive testing, the last of which was in December 2017, and then had a shorter evaluation in the hospital in 12/2018. I have not seen any significant cognitive decline, despite continued complaints. Last testing Dr. Si Raider felt that some of the issues related to the patient's personality of seeing the world in a negative light. I tend to agree with this.  Patient asked me about the memory change again today, and we discussed this again today as we have many other times. 7. Urinary incontinence, frequency and nocturia -f/u with urology 8.  Discussed the method of which he sees me.  Told him that it was okay that he sees me occasionally in virtual visits, but that I also need to see him in person visits.  Patient/wife expressed understanding.  They request a virtual visit next time.  Cc:  [name], MD

## 2019-05-28 ENCOUNTER — Ambulatory Visit (INDEPENDENT_AMBULATORY_CARE_PROVIDER_SITE_OTHER): Payer: Medicare Other | Admitting: Neurology

## 2019-05-28 ENCOUNTER — Encounter: Payer: Self-pay | Admitting: Neurology

## 2019-05-28 ENCOUNTER — Other Ambulatory Visit: Payer: Self-pay

## 2019-05-28 ENCOUNTER — Telehealth: Payer: Self-pay | Admitting: Neurology

## 2019-05-28 VITALS — BP 112/68 | HR 81 | Wt 295.0 lb

## 2019-05-28 DIAGNOSIS — R413 Other amnesia: Secondary | ICD-10-CM | POA: Diagnosis not present

## 2019-05-28 DIAGNOSIS — F331 Major depressive disorder, recurrent, moderate: Secondary | ICD-10-CM | POA: Diagnosis not present

## 2019-05-28 DIAGNOSIS — E559 Vitamin D deficiency, unspecified: Secondary | ICD-10-CM | POA: Diagnosis not present

## 2019-05-28 DIAGNOSIS — I872 Venous insufficiency (chronic) (peripheral): Secondary | ICD-10-CM | POA: Diagnosis not present

## 2019-05-28 DIAGNOSIS — R9401 Abnormal electroencephalogram [EEG]: Secondary | ICD-10-CM

## 2019-05-28 DIAGNOSIS — G20 Parkinson's disease: Secondary | ICD-10-CM

## 2019-05-28 DIAGNOSIS — G20A1 Parkinson's disease without dyskinesia, without mention of fluctuations: Secondary | ICD-10-CM

## 2019-05-28 DIAGNOSIS — R410 Disorientation, unspecified: Secondary | ICD-10-CM

## 2019-05-28 DIAGNOSIS — F332 Major depressive disorder, recurrent severe without psychotic features: Secondary | ICD-10-CM | POA: Diagnosis not present

## 2019-05-28 DIAGNOSIS — G4733 Obstructive sleep apnea (adult) (pediatric): Secondary | ICD-10-CM | POA: Diagnosis not present

## 2019-05-28 DIAGNOSIS — E538 Deficiency of other specified B group vitamins: Secondary | ICD-10-CM | POA: Diagnosis not present

## 2019-05-28 DIAGNOSIS — R441 Visual hallucinations: Secondary | ICD-10-CM

## 2019-05-28 NOTE — Patient Instructions (Signed)
-   In 1 week decrease ropinirole to 1 tablet once a day for a week then STOP

## 2019-05-28 NOTE — Telephone Encounter (Signed)
Called patient spouse she was informed of results and understands

## 2019-05-28 NOTE — Telephone Encounter (Signed)
Patient wife states that he has appt with Dr Adele Schilder on 06-04-19 @ 3:00. She also states that his EEG results are on his My Chart acct but they dont understand the results and would like a call back about them so she can understand them  Please call

## 2019-05-28 NOTE — Telephone Encounter (Signed)
Ludwig Clarks, DO sent to Ranae Plumber, CMA        Let wife know that results were as expected (I discussed with pt/her in room that I thought that this would be result). No evidence of seizure. Mildly slow eeg but not bad at all. Since getting better lets hold on ambulatory EEG for now as yield will be low. Doesn't sound like seizure

## 2019-05-28 NOTE — Procedures (Signed)
TECHNICAL SUMMARY:  A multichannel referential and bipolar montage EEG using the standard international 10-20 system was performed on the patient described as awake.  The dominant background activity consists of 8 hertz activity seen most prominantly over the posterior head region.  Much 7 Hz activity is intermixed.  The backgound activity is reactive to eye opening and closing procedures.  Low voltage fast (beta) activity is distributed symmetrically and maximally over the anterior head regions.  ACTIVATION:  Stepwise photic stimulation at 4-20 flashes per second was performed and did not elicit any abnormal waveforms.  Hyperventilation was not performed  EPILEPTIFORM ACTIVITY:  There were no spikes, sharp waves or paroxysmal activity.  SLEEP: No sleep is noted  CARDIAC:  The EKG lead revealed a regular rhythm  IMPRESSION:  This is an abnormal EEG demonstrating a very mild diffuse slowing of electrocerebral activity.  This can be seen in a wide variety of encephalopathic state including those of a toxic, metabolic, or degenerative nature.  There were no focal, hemispheric, or lateralizing features.  No epileptiform activity was recorded.

## 2019-05-30 ENCOUNTER — Telehealth: Payer: Self-pay | Admitting: Internal Medicine

## 2019-05-30 NOTE — Telephone Encounter (Signed)
Home Health Verbal Orders - Caller/Agency: [facility] Callback Number: [phone] secure Requesting OT/PT/Skilled Nursing/Social Work/Speech Therapy: PT Frequency: 2x for 8wks.

## 2019-05-30 NOTE — Telephone Encounter (Signed)
Fine

## 2019-05-30 NOTE — Telephone Encounter (Signed)
LVM with verbals 

## 2019-06-02 ENCOUNTER — Other Ambulatory Visit (HOSPITAL_COMMUNITY): Payer: Self-pay | Admitting: Psychiatry

## 2019-06-02 DIAGNOSIS — F332 Major depressive disorder, recurrent severe without psychotic features: Secondary | ICD-10-CM

## 2019-06-02 DIAGNOSIS — F411 Generalized anxiety disorder: Secondary | ICD-10-CM

## 2019-06-03 ENCOUNTER — Telehealth: Payer: Medicare Other | Admitting: Neurology

## 2019-06-04 ENCOUNTER — Telehealth: Payer: Self-pay | Admitting: Internal Medicine

## 2019-06-04 ENCOUNTER — Ambulatory Visit (INDEPENDENT_AMBULATORY_CARE_PROVIDER_SITE_OTHER): Payer: Medicare Other | Admitting: Psychiatry

## 2019-06-04 ENCOUNTER — Other Ambulatory Visit: Payer: Self-pay

## 2019-06-04 ENCOUNTER — Encounter (HOSPITAL_COMMUNITY): Payer: Self-pay | Admitting: Psychiatry

## 2019-06-04 DIAGNOSIS — F411 Generalized anxiety disorder: Secondary | ICD-10-CM | POA: Diagnosis not present

## 2019-06-04 DIAGNOSIS — E538 Deficiency of other specified B group vitamins: Secondary | ICD-10-CM | POA: Diagnosis not present

## 2019-06-04 DIAGNOSIS — G20 Parkinson's disease: Secondary | ICD-10-CM | POA: Diagnosis not present

## 2019-06-04 DIAGNOSIS — F332 Major depressive disorder, recurrent severe without psychotic features: Secondary | ICD-10-CM

## 2019-06-04 DIAGNOSIS — E559 Vitamin D deficiency, unspecified: Secondary | ICD-10-CM | POA: Diagnosis not present

## 2019-06-04 DIAGNOSIS — G4733 Obstructive sleep apnea (adult) (pediatric): Secondary | ICD-10-CM | POA: Diagnosis not present

## 2019-06-04 DIAGNOSIS — I872 Venous insufficiency (chronic) (peripheral): Secondary | ICD-10-CM | POA: Diagnosis not present

## 2019-06-04 MED ORDER — OLANZAPINE 2.5 MG PO TABS: 2.5000 mg | ORAL_TABLET | Freq: Every day | ORAL | 0 refills | Status: DC

## 2019-06-04 MED ORDER — CITALOPRAM HYDROBROMIDE 40 MG PO TABS: 40.0000 mg | ORAL_TABLET | Freq: Every day | ORAL | 0 refills | Status: DC

## 2019-06-04 MED ORDER — BUPROPION HCL ER (XL) 150 MG PO TB24: 150.0000 mg | ORAL_TABLET | Freq: Every day | ORAL | 0 refills | Status: DC

## 2019-06-04 MED ORDER — BUPROPION HCL ER (XL) 300 MG PO TB24: 300.0000 mg | ORAL_TABLET | ORAL | 0 refills | Status: DC

## 2019-06-04 NOTE — Telephone Encounter (Signed)
Patient is incontinent and has been using them for 2-3 months now off and on, but now completley incontinent. States that she told you during the virtual visit and in the in office visit that he was incontinent and needs these states that they have been getting them from our office for the last 2-3 months. States per medicare has to be ordered monthly and will have to be signed off  Monthly. Did not go into why patient was completely incontinent just that he was and needs the catheters.

## 2019-06-04 NOTE — Telephone Encounter (Signed)
I do not know why patient needs these. He does not have a prostate condition which would make him unable to urinate.

## 2019-06-04 NOTE — Telephone Encounter (Signed)
I think something was signed by plotnikov when you were out, but I think you want patient to get these from another provider? [facility] is needing documentation to why patient needs these. Just need to know if we are going to send these in for patient or if they need to have someone else send these in.

## 2019-06-04 NOTE — Progress Notes (Signed)
Virtual Visit via Telephone Note  I connected with [name] [name] on 06/04/19 at  3:00 PM EDT by telephone and verified that I am speaking with the correct person using two identifiers.   I discussed the limitations, risks, security and privacy concerns of performing an evaluation and management service by telephone and the availability of in person appointments. I also discussed with the patient that there may be a patient responsible charge related to this service. The patient expressed understanding and agreed to proceed.   History of Present Illness: Patient was evaluated through phone session.  I talked to him on August 18 with him and his wife as he was experiencing lot of irritability, hallucination, and agitated.  He was seeing things and wife is concerned because he was delusional and very restless.  He was also not able to move and had a difficult time at night.  He was also very isolated and withdrawn but it was unclear what triggered the symptoms.  We recommended to try low-dose Zyprexa and he also saw his neurologist [name] Tat who cut down Requip.  His wife told that he has been doing miraculously much better.  He is less delusional and he is sleeping good.  He is not as restless or agitated.  At that time they were thinking about moving to assisted living but now patient and his wife feels that things are much better and they can handle the current situation.  He is taking Zyprexa 2.5 mg at bedtime which is helping his sleep, hallucination, irritability and agitation.  He is also taking Wellbutrin 450 mg and Celexa 40 mg daily.  His wife did not mention any worsening of the symptoms or side effects which we discussed including tremors, shakes, worsening of Parkinson, weight gain and increased blood sugar.  Patient reported his appetite remains the same but he will watch carefully not to gain weight.  His tremors remain the same.  His memory is actually better as he able to talk today and  comprehend the symptoms.  He also had a EEG and he was told everything is normal.  Patient denies any suicidal thoughts or homicidal thought.  He reported his motivation is slowly getting better and he is more active.  However he is still dependent on his wife who lives with him and very supportive.  Patient like to continue his current medication.  Past Psychiatric History:Reviewed. H/Odepression and anxiety.Noh/osuicidal attempt, mania,inpatient psychiatric treatment.  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 Seeingin this office since September 2009. He has seen multiple psychiatrists in the past.   Psychiatric Specialty Exam: Physical Exam  ROS  There were no vitals taken for this visit.There is no height or weight on file to calculate BMI.  General Appearance: NA  Eye Contact:  NA  Speech:  Slow  Volume:  Decreased  Mood:  Euthymic  Affect:  NA  Thought Process:  Descriptions of Associations: Intact  Orientation:  Other:  Alert and oriented x2  Thought Content:  Hallucinations: Visual, Rumination and Poverty of thought content  Suicidal Thoughts:  No  Homicidal Thoughts:  No  Memory:  Immediate;   Fair Recent;   Fair Remote;   Fair  Judgement:  Good  Insight:  Fair  Psychomotor Activity:  NA  Concentration:  Concentration: Fair and Attention Span: Fair  Recall:  [employer] of Knowledge:  Fair  Language:  Fair  Akathisia:  Tremors  Handed:  Right  AIMS (if indicated):     Assets:  Desire for Improvement Housing Social Support  ADL's:  Intact  Cognition:  Impaired,  Mild  Sleep:   Improved      Recent Results (from the past 2160 hour(s))  Urinalysis with microscopic (not at [facility])     Status: Abnormal   Collection Time: 03/29/19 12:00 AM  Result Value Ref Range   Color - urine yellow    Appearance turbid    Specific Gravity,  Urine 1.027    pH, Urine 5.5    Protein, Urine 10    Glucose Neg    Ketones, urine Neg    Bilirubin Urine Neg    Blood, UA Neg    Urobilinogen, UA Normal Normal   Nitrite, UA Neg    Leukocytes, UA Negative (A) (none)   Bacteria moderate    Hyaline Cast 6-10    Squamous Epithelial few    Calcium Oxalate Crystal present    WBC, UA 11    RBC, UA 2   T4, free     Status: None   Collection Time: 05/09/19  3:40 PM  Result Value Ref Range   Free T4 0.91 0.60 - 1.60 ng/dL    Comment: Specimens from patients who are undergoing biotin therapy and /or ingesting biotin supplements may contain high levels of biotin.  The higher biotin concentration in these specimens interferes with this Free T4 assay.  Specimens that contain high levels  of biotin may cause false high results for this Free T4 assay.  Please interpret results in light of the total clinical presentation of the patient.    VITAMIN D 25 Hydroxy (Vit-D Deficiency, Fractures)     Status: Abnormal   Collection Time: 05/09/19  3:40 PM  Result Value Ref Range   VITD 14.83 (L) 30.00 - 100.00 ng/mL  TSH     Status: None   Collection Time: 05/09/19  3:40 PM  Result Value Ref Range   TSH 1.06 0.35 - 4.50 uIU/mL  Vitamin B12     Status: None   Collection Time: 05/09/19  3:40 PM  Result Value Ref Range   Vitamin B-12 777 211 - 911 pg/mL  Lipase     Status: Abnormal   Collection Time: 05/09/19  3:40 PM  Result Value Ref Range   Lipase 6.0 (L) 11.0 - 59.0 U/L  Lipid panel     Status: None   Collection Time: 05/09/19  3:40 PM  Result Value Ref Range   Cholesterol 141 0 - 200 mg/dL    Comment: ATP III Classification       Desirable:  < 200 mg/dL               Borderline High:  200 - 239 mg/dL          High:  > = 240 mg/dL   Triglycerides 94.0 0.0 - 149.0 mg/dL    Comment: Normal:  <150 mg/dLBorderline High:  150 - 199 mg/dL   HDL 51.80 >39.00 mg/dL   VLDL 18.8 0.0 - 40.0 mg/dL   LDL Cholesterol 71 0 - 99 mg/dL   Total CHOL/HDL  Ratio 3     Comment:                Men          Women1/2 Average Risk     3.4          3.3Average Risk  5.0          4.42X Average Risk          9.6          7.13X Average Risk          15.0          11.0                       NonHDL 89.51     Comment: NOTE:  Non-HDL goal should be 30 mg/dL higher than patient's LDL goal (i.e. LDL goal of < 70 mg/dL, would have non-HDL goal of < 100 mg/dL)  Hemoglobin A1c     Status: None   Collection Time: 05/09/19  3:40 PM  Result Value Ref Range   Hgb A1c MFr Bld 6.1 4.6 - 6.5 %    Comment: Glycemic Control Guidelines for People with Diabetes:Non Diabetic:  <6%Goal of Therapy: <7%Additional Action Suggested:  >8%   CBC     Status: Abnormal   Collection Time: 05/09/19  3:40 PM  Result Value Ref Range   WBC 6.5 4.0 - 10.5 K/uL   RBC 4.24 4.22 - 5.81 Mil/uL   Platelets 332.0 150.0 - 400.0 K/uL   Hemoglobin 12.8 (L) 13.0 - 17.0 g/dL   HCT 38.2 (L) 39.0 - 52.0 %   MCV 90.1 78.0 - 100.0 fl   MCHC 33.4 30.0 - 36.0 g/dL   RDW 14.8 11.5 - 15.5 %  Comprehensive metabolic panel     Status: Abnormal   Collection Time: 05/09/19  3:40 PM  Result Value Ref Range   Sodium 140 135 - 145 mEq/L   Potassium 4.2 3.5 - 5.1 mEq/L   Chloride 106 96 - 112 mEq/L   CO2 28 19 - 32 mEq/L   Glucose, Bld 104 (H) 70 - 99 mg/dL   BUN 17 6 - 23 mg/dL   Creatinine, Ser 1.22 0.40 - 1.50 mg/dL   Total Bilirubin 0.5 0.2 - 1.2 mg/dL   Alkaline Phosphatase 61 39 - 117 U/L   AST 10 0 - 37 U/L   ALT 3 0 - 53 U/L   Total Protein 6.8 6.0 - 8.3 g/dL   Albumin 4.1 3.5 - 5.2 g/dL   Calcium 9.3 8.4 - 10.5 mg/dL   GFR 59.47 (L) >60.00 mL/min      Assessment and Plan: Major depressive disorder, recurrent.  Generalized anxiety disorder.  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  Patient and his wife noticed much improvement with addition of low-dose olanzapine.  He do not recall worsening of tremors or any dizziness.  His  hallucination and delusions are not as bad.  He is able to sleep.  He does not want to change medication.  I will continue Wellbutrin XL 300 mg in the morning and 150 mg in the evening, Celexa 40 mg daily and continue olanzapine 2.5 mg at bedtime.  Encouraged to resume therapy with Dr. parent for CBT.  Patient wants to follow-up in 3 months his medicine is working very well.  I also reviewed blood work results and notes from his neurologist.  His hemoglobin A1c is 6.2.  Discussed medication side effects and benefits.  Recommended to call us back if is any question or any concern.  Follow-up in 3 months.  Time spent 30 minutes.  More than 50% of the time spent in psychoeducation, counseling, coronation of care, prognosis and reviewing the chart and the blood  work results.  Follow Up Instructions:    I discussed the assessment and treatment plan with the patient. The patient was provided an opportunity to ask questions and all were answered. The patient agreed with the plan and demonstrated an understanding of the instructions.   The patient was advised to call back or seek an in-person evaluation if the symptoms worsen or if the condition fails to improve as anticipated.  I provided 30 minutes of non-face-to-face time during this encounter.   Kathlee Nations, MD

## 2019-06-04 NOTE — Telephone Encounter (Signed)
pts spouse called in because she says that the pt has to have something sent to [facility] every month for Condom Catheters with the bags and other equipment  Attached. Per spouse  [facility] has faxed over request and is unable to send out supply due to "pending documentation" from provider. spouse would like to make provider aware to expect something from them every month as needed.   [phone]   Please assist.

## 2019-06-05 NOTE — Telephone Encounter (Signed)
But they have not taken him to ER during an episode as requested to see if we can find the source of the problem. I do see that they have talked to the neurologist and they have asked them to see psychiatrist to see if this can help his hallucinations, have they seen any improvement in his symptoms?

## 2019-06-05 NOTE — Telephone Encounter (Signed)
Patient wife wanted to make a telephone call visit to discuss to get this all figured out so she can get the equipment needed for patient since he is incontinent

## 2019-06-06 ENCOUNTER — Encounter: Payer: Self-pay | Admitting: Internal Medicine

## 2019-06-06 ENCOUNTER — Ambulatory Visit (INDEPENDENT_AMBULATORY_CARE_PROVIDER_SITE_OTHER): Payer: Medicare Other | Admitting: Internal Medicine

## 2019-06-06 ENCOUNTER — Ambulatory Visit: Payer: Self-pay | Admitting: Neurology

## 2019-06-06 DIAGNOSIS — G4733 Obstructive sleep apnea (adult) (pediatric): Secondary | ICD-10-CM | POA: Diagnosis not present

## 2019-06-06 DIAGNOSIS — N3281 Overactive bladder: Secondary | ICD-10-CM

## 2019-06-06 DIAGNOSIS — I872 Venous insufficiency (chronic) (peripheral): Secondary | ICD-10-CM | POA: Diagnosis not present

## 2019-06-06 DIAGNOSIS — E559 Vitamin D deficiency, unspecified: Secondary | ICD-10-CM | POA: Diagnosis not present

## 2019-06-06 DIAGNOSIS — F332 Major depressive disorder, recurrent severe without psychotic features: Secondary | ICD-10-CM | POA: Diagnosis not present

## 2019-06-06 DIAGNOSIS — E538 Deficiency of other specified B group vitamins: Secondary | ICD-10-CM | POA: Diagnosis not present

## 2019-06-06 DIAGNOSIS — G20 Parkinson's disease: Secondary | ICD-10-CM | POA: Diagnosis not present

## 2019-06-06 NOTE — Progress Notes (Signed)
Virtual Visit via Audio Note  I connected with [name] [name] on 06/06/19 at  9:40 AM EDT by an audio-only enabled telemedicine application and verified that I am speaking with the correct person using two identifiers.  The patient and the provider were at separate locations throughout the entire encounter.   I discussed the limitations of evaluation and management by telemedicine and the availability of in person appointments. The patient expressed understanding and agreed to proceed.  History of Present Illness: The patient is a 68 y.o. man with visit for concerns about urinary incontinence. He has been using external catheters for several months now. Started months ago when he was having hallucinations and extreme weakness and unable to tell if he needed to urinate or get to bathroom. They have been consistently using them 24/7 at this time due to constantly wetting. Denies pain with urination. He is having mostly large volume voids into condom catheter currently. Cannot control urine when sleeping and sleeps at night as well as napping some during the day. Taking [name] [name] and has not missed doses of this. Has no fevers or chills. Hallucinations are greatly improved on olanzapine and they are happy with this. His wife has noticed that he is more with it and finally able to work with PT and mobility. He is not able to move quickly or get to bathroom without help currently. Overall it is stable. His wife helps to provide most history but both are on call.   Observations/Objective: Voice stable and alert and oriented, no coughing or dyspnea during visit  Assessment and Plan: See problem oriented charting  Follow Up Instructions: continue external catheters discussed bedside urinal and voiding every 30 minutes once able to help with bladder functionality.   Visit time 12 minutes: that time was spent in non-face to face counseling and coordination of care with the patient: counseled about as  above  I discussed the assessment and treatment plan with the patient. The patient was provided an opportunity to ask questions and all were answered. The patient agreed with the plan and demonstrated an understanding of the instructions.   The patient was advised to call back or seek an in-person evaluation if the symptoms worsen or if the condition fails to improve as anticipated.  [name] Koch, MD

## 2019-06-06 NOTE — Assessment & Plan Note (Signed)
At this time he is not mobile enough to get to the bathroom and needs condom catheter while sleeping. I have asked wife to consider no catheter while awake and encourage bedside urinal to help promote normal bladder function as he is mentally able to do this. Especially at first to encourage bladder emptying every 30 minutes or more frequently to help avoid these long term. Okay with still using currently and moving to only when sleeping in the near future with the goal of being more mobile with PT and using mix of bathroom and bedside urinal.

## 2019-06-11 DIAGNOSIS — E559 Vitamin D deficiency, unspecified: Secondary | ICD-10-CM | POA: Diagnosis not present

## 2019-06-11 DIAGNOSIS — I872 Venous insufficiency (chronic) (peripheral): Secondary | ICD-10-CM | POA: Diagnosis not present

## 2019-06-11 DIAGNOSIS — G20 Parkinson's disease: Secondary | ICD-10-CM | POA: Diagnosis not present

## 2019-06-11 DIAGNOSIS — E538 Deficiency of other specified B group vitamins: Secondary | ICD-10-CM | POA: Diagnosis not present

## 2019-06-11 DIAGNOSIS — G4733 Obstructive sleep apnea (adult) (pediatric): Secondary | ICD-10-CM | POA: Diagnosis not present

## 2019-06-11 DIAGNOSIS — F332 Major depressive disorder, recurrent severe without psychotic features: Secondary | ICD-10-CM | POA: Diagnosis not present

## 2019-06-13 DIAGNOSIS — G20 Parkinson's disease: Secondary | ICD-10-CM | POA: Diagnosis not present

## 2019-06-13 DIAGNOSIS — E538 Deficiency of other specified B group vitamins: Secondary | ICD-10-CM | POA: Diagnosis not present

## 2019-06-13 DIAGNOSIS — E559 Vitamin D deficiency, unspecified: Secondary | ICD-10-CM | POA: Diagnosis not present

## 2019-06-13 DIAGNOSIS — F332 Major depressive disorder, recurrent severe without psychotic features: Secondary | ICD-10-CM | POA: Diagnosis not present

## 2019-06-13 DIAGNOSIS — I872 Venous insufficiency (chronic) (peripheral): Secondary | ICD-10-CM | POA: Diagnosis not present

## 2019-06-13 DIAGNOSIS — G4733 Obstructive sleep apnea (adult) (pediatric): Secondary | ICD-10-CM | POA: Diagnosis not present

## 2019-06-14 DIAGNOSIS — G4733 Obstructive sleep apnea (adult) (pediatric): Secondary | ICD-10-CM | POA: Diagnosis not present

## 2019-06-14 DIAGNOSIS — G20 Parkinson's disease: Secondary | ICD-10-CM | POA: Diagnosis not present

## 2019-06-14 DIAGNOSIS — I872 Venous insufficiency (chronic) (peripheral): Secondary | ICD-10-CM | POA: Diagnosis not present

## 2019-06-14 DIAGNOSIS — F332 Major depressive disorder, recurrent severe without psychotic features: Secondary | ICD-10-CM | POA: Diagnosis not present

## 2019-06-14 DIAGNOSIS — E538 Deficiency of other specified B group vitamins: Secondary | ICD-10-CM | POA: Diagnosis not present

## 2019-06-14 DIAGNOSIS — E559 Vitamin D deficiency, unspecified: Secondary | ICD-10-CM | POA: Diagnosis not present

## 2019-06-16 DIAGNOSIS — E559 Vitamin D deficiency, unspecified: Secondary | ICD-10-CM | POA: Diagnosis not present

## 2019-06-16 DIAGNOSIS — G20 Parkinson's disease: Secondary | ICD-10-CM | POA: Diagnosis not present

## 2019-06-16 DIAGNOSIS — G4733 Obstructive sleep apnea (adult) (pediatric): Secondary | ICD-10-CM | POA: Diagnosis not present

## 2019-06-16 DIAGNOSIS — E538 Deficiency of other specified B group vitamins: Secondary | ICD-10-CM | POA: Diagnosis not present

## 2019-06-16 DIAGNOSIS — F332 Major depressive disorder, recurrent severe without psychotic features: Secondary | ICD-10-CM | POA: Diagnosis not present

## 2019-06-16 DIAGNOSIS — I872 Venous insufficiency (chronic) (peripheral): Secondary | ICD-10-CM | POA: Diagnosis not present

## 2019-06-18 DIAGNOSIS — G4733 Obstructive sleep apnea (adult) (pediatric): Secondary | ICD-10-CM | POA: Diagnosis not present

## 2019-06-18 DIAGNOSIS — G20 Parkinson's disease: Secondary | ICD-10-CM | POA: Diagnosis not present

## 2019-06-18 DIAGNOSIS — E559 Vitamin D deficiency, unspecified: Secondary | ICD-10-CM | POA: Diagnosis not present

## 2019-06-18 DIAGNOSIS — F332 Major depressive disorder, recurrent severe without psychotic features: Secondary | ICD-10-CM | POA: Diagnosis not present

## 2019-06-18 DIAGNOSIS — I872 Venous insufficiency (chronic) (peripheral): Secondary | ICD-10-CM | POA: Diagnosis not present

## 2019-06-18 DIAGNOSIS — E538 Deficiency of other specified B group vitamins: Secondary | ICD-10-CM | POA: Diagnosis not present

## 2019-06-19 ENCOUNTER — Ambulatory Visit: Payer: Self-pay

## 2019-06-19 ENCOUNTER — Telehealth: Payer: Self-pay | Admitting: Internal Medicine

## 2019-06-19 ENCOUNTER — Emergency Department (HOSPITAL_COMMUNITY): Payer: Medicare Other

## 2019-06-19 ENCOUNTER — Other Ambulatory Visit: Payer: Self-pay

## 2019-06-19 ENCOUNTER — Inpatient Hospital Stay (HOSPITAL_COMMUNITY)
Admission: EM | Admit: 2019-06-19 | Discharge: 2019-06-24 | DRG: 871 | Disposition: A | Discharge status: Skilled Nursing Facility | Payer: Medicare Other | Attending: Family Medicine | Admitting: Family Medicine

## 2019-06-19 DIAGNOSIS — G20 Parkinson's disease: Secondary | ICD-10-CM | POA: Diagnosis present

## 2019-06-19 DIAGNOSIS — R531 Weakness: Secondary | ICD-10-CM

## 2019-06-19 DIAGNOSIS — R509 Fever, unspecified: Secondary | ICD-10-CM | POA: Diagnosis not present

## 2019-06-19 DIAGNOSIS — L8990 Pressure ulcer of unspecified site, unspecified stage: Secondary | ICD-10-CM | POA: Insufficient documentation

## 2019-06-19 DIAGNOSIS — Z20828 Contact with and (suspected) exposure to other viral communicable diseases: Secondary | ICD-10-CM | POA: Diagnosis not present

## 2019-06-19 DIAGNOSIS — R0902 Hypoxemia: Secondary | ICD-10-CM | POA: Diagnosis not present

## 2019-06-19 DIAGNOSIS — Z79899 Other long term (current) drug therapy: Secondary | ICD-10-CM

## 2019-06-19 DIAGNOSIS — R404 Transient alteration of awareness: Secondary | ICD-10-CM | POA: Diagnosis not present

## 2019-06-19 DIAGNOSIS — W19XXXA Unspecified fall, initial encounter: Secondary | ICD-10-CM | POA: Diagnosis not present

## 2019-06-19 DIAGNOSIS — A419 Sepsis, unspecified organism: Secondary | ICD-10-CM | POA: Diagnosis present

## 2019-06-19 DIAGNOSIS — F411 Generalized anxiety disorder: Secondary | ICD-10-CM

## 2019-06-19 DIAGNOSIS — Z6835 Body mass index (BMI) 35.0-35.9, adult: Secondary | ICD-10-CM

## 2019-06-19 DIAGNOSIS — F329 Major depressive disorder, single episode, unspecified: Secondary | ICD-10-CM | POA: Diagnosis not present

## 2019-06-19 DIAGNOSIS — E538 Deficiency of other specified B group vitamins: Secondary | ICD-10-CM | POA: Diagnosis not present

## 2019-06-19 DIAGNOSIS — G4733 Obstructive sleep apnea (adult) (pediatric): Secondary | ICD-10-CM | POA: Diagnosis not present

## 2019-06-19 DIAGNOSIS — F332 Major depressive disorder, recurrent severe without psychotic features: Secondary | ICD-10-CM | POA: Diagnosis not present

## 2019-06-19 DIAGNOSIS — Z23 Encounter for immunization: Secondary | ICD-10-CM

## 2019-06-19 DIAGNOSIS — J189 Pneumonia, unspecified organism: Secondary | ICD-10-CM | POA: Diagnosis not present

## 2019-06-19 DIAGNOSIS — A4151 Sepsis due to Escherichia coli [E. coli]: Secondary | ICD-10-CM | POA: Diagnosis not present

## 2019-06-19 DIAGNOSIS — Z20822 Contact with and (suspected) exposure to covid-19: Secondary | ICD-10-CM

## 2019-06-19 DIAGNOSIS — N390 Urinary tract infection, site not specified: Secondary | ICD-10-CM | POA: Diagnosis not present

## 2019-06-19 DIAGNOSIS — I10 Essential (primary) hypertension: Secondary | ICD-10-CM | POA: Diagnosis not present

## 2019-06-19 DIAGNOSIS — K5900 Constipation, unspecified: Secondary | ICD-10-CM | POA: Diagnosis present

## 2019-06-19 DIAGNOSIS — E559 Vitamin D deficiency, unspecified: Secondary | ICD-10-CM | POA: Diagnosis not present

## 2019-06-19 DIAGNOSIS — N3000 Acute cystitis without hematuria: Secondary | ICD-10-CM

## 2019-06-19 DIAGNOSIS — E669 Obesity, unspecified: Secondary | ICD-10-CM | POA: Diagnosis present

## 2019-06-19 DIAGNOSIS — Z96642 Presence of left artificial hip joint: Secondary | ICD-10-CM | POA: Diagnosis present

## 2019-06-19 DIAGNOSIS — I872 Venous insufficiency (chronic) (peripheral): Secondary | ICD-10-CM | POA: Diagnosis not present

## 2019-06-19 DIAGNOSIS — G20A1 Parkinson's disease without dyskinesia, without mention of fluctuations: Secondary | ICD-10-CM | POA: Diagnosis present

## 2019-06-19 DIAGNOSIS — L89152 Pressure ulcer of sacral region, stage 2: Secondary | ICD-10-CM | POA: Diagnosis present

## 2019-06-19 DIAGNOSIS — R32 Unspecified urinary incontinence: Secondary | ICD-10-CM | POA: Diagnosis present

## 2019-06-19 LAB — CBC WITH DIFFERENTIAL/PLATELET
Abs Immature Granulocytes: 0.05 10*3/uL (ref 0.00–0.07)
Basophils Absolute: 0 10*3/uL (ref 0.0–0.1)
Basophils Relative: 0 %
Eosinophils Absolute: 0 10*3/uL (ref 0.0–0.5)
Eosinophils Relative: 0 %
HCT: 32.3 % — ABNORMAL LOW (ref 39.0–52.0)
Hemoglobin: 10.7 g/dL — ABNORMAL LOW (ref 13.0–17.0)
Immature Granulocytes: 1 %
Lymphocytes Relative: 3 %
Lymphs Abs: 0.3 10*3/uL — ABNORMAL LOW (ref 0.7–4.0)
MCH: 30.7 pg (ref 26.0–34.0)
MCHC: 33.1 g/dL (ref 30.0–36.0)
MCV: 92.8 fL (ref 80.0–100.0)
Monocytes Absolute: 0.7 10*3/uL (ref 0.1–1.0)
Monocytes Relative: 7 %
Neutro Abs: 9.2 10*3/uL — ABNORMAL HIGH (ref 1.7–7.7)
Neutrophils Relative %: 89 %
Platelets: 253 10*3/uL (ref 150–400)
RBC: 3.48 MIL/uL — ABNORMAL LOW (ref 4.22–5.81)
RDW: 13.1 % (ref 11.5–15.5)
WBC: 10.3 10*3/uL (ref 4.0–10.5)
nRBC: 0 % (ref 0.0–0.2)

## 2019-06-19 LAB — URINALYSIS, ROUTINE W REFLEX MICROSCOPIC
Bilirubin Urine: NEGATIVE
Glucose, UA: NEGATIVE mg/dL
Ketones, ur: 5 mg/dL — AB
Nitrite: POSITIVE — AB
Protein, ur: 30 mg/dL — AB
Specific Gravity, Urine: 1.014 (ref 1.005–1.030)
WBC, UA: >50 WBC/hpf — ABNORMAL HIGH (ref 0–5)
pH: 5 (ref 5.0–8.0)

## 2019-06-19 LAB — COMPREHENSIVE METABOLIC PANEL
ALT: 10 U/L (ref 0–44)
AST: 52 U/L — ABNORMAL HIGH (ref 15–41)
Albumin: 3.1 g/dL — ABNORMAL LOW (ref 3.5–5.0)
Alkaline Phosphatase: 83 U/L (ref 38–126)
Anion gap: 11 (ref 5–15)
BUN: 17 mg/dL (ref 8–23)
CO2: 22 mmol/L (ref 22–32)
Calcium: 8.4 mg/dL — ABNORMAL LOW (ref 8.9–10.3)
Chloride: 99 mmol/L (ref 98–111)
Creatinine, Ser: 1.2 mg/dL (ref 0.61–1.24)
GFR calc Af Amer: >60 mL/min (ref >60)
GFR calc non Af Amer: >60 mL/min (ref >60)
Glucose, Bld: 125 mg/dL — ABNORMAL HIGH (ref 70–99)
Potassium: 3.9 mmol/L (ref 3.5–5.1)
Sodium: 132 mmol/L — ABNORMAL LOW (ref 135–145)
Total Bilirubin: 0.8 mg/dL (ref 0.3–1.2)
Total Protein: 6.7 g/dL (ref 6.5–8.1)

## 2019-06-19 LAB — LACTIC ACID, PLASMA: Lactic Acid, Venous: 1.9 mmol/L (ref 0.5–1.9)

## 2019-06-19 MED ORDER — SODIUM CHLORIDE 0.9 % IV SOLN
1.0000 g | Freq: Once | INTRAVENOUS | Status: AC
  Administered 2019-06-19: 23:00:00 1 g via INTRAVENOUS
  Filled 2019-06-19: qty 10

## 2019-06-19 MED ORDER — SODIUM CHLORIDE 0.9 % IV BOLUS (SEPSIS)
1000.0000 mL | Freq: Once | INTRAVENOUS | Status: AC
  Administered 2019-06-19: 20:00:00 1000 mL via INTRAVENOUS

## 2019-06-19 MED ORDER — SODIUM CHLORIDE 0.9 % IV SOLN
1000.0000 mL | INTRAVENOUS | Status: DC
  Administered 2019-06-19 – 2019-06-22 (×3): 1000 mL via INTRAVENOUS

## 2019-06-19 NOTE — Telephone Encounter (Signed)
Earlier when I spoke to wife, she declined our COVID testing due to patient being homebound. Can we place orders for [facility] nurse to do testing?

## 2019-06-19 NOTE — ED Triage Notes (Signed)
Pt BIB EMS from home. Pt has hx of Parkinsons. Pt fell out of chair, pt hit face. Pt denies LOC. Pt c/o dark urine.   20G L FA

## 2019-06-19 NOTE — Telephone Encounter (Signed)
I do not think [facility] is able to do covid testing. The only covid testing I have capacity to order is with drive through. The other option is EMS to get him to a hospital for evaluation (especially if functional or mental status changes).

## 2019-06-19 NOTE — Telephone Encounter (Signed)
Patient's wife informed of below. She is now agreeable to take patient to [facility] for testing. Order placed. Wife provided address of testing site.

## 2019-06-19 NOTE — ED Provider Notes (Signed)
[facility] DEPT Provider Note   CSN: [id number] Arrival date & time: 06/19/19  1910     History   Chief Complaint Chief Complaint  Patient presents with  . Fall  . Urinary Tract Infection    HPI [name] [name] is a 68 y.o. male.     HPI Patient presents to the emergency room for evaluation of fever and weakness.  Patient has a history of Parkinson's disease.  Wife states that last evening the patient developed a fever up to 102.  He has not however had any other infectious type symptoms.  He has not been coughing.  He has not had any vomiting or diarrhea.  She has not noticed any rashes.  Wife called the patient's doctor and they had him get sent for an outpatient covid test.  That was performed today.  Wife does not think it is likely he has he has not had any exposure to anyone other than that doctors appointments.  Today however he was trying to get up to the go to the bathroom and he became very weak.  He was not able to stand.  EMS was called.  Patient has not had any fever today.  He denies any complaints.  He denies any headache or head injury.  No injuries from sliding out of the chair. Past Medical History:  Diagnosis Date  . APPENDECTOMY, HX OF 02/18/2008   Qualifier: Diagnosis of  By: Danny Lawless CMA, [place]    . Cardiac murmur    as a child  . Chronic idiopathic constipation   . Depression   . Headache(784.0)   . HEADACHES, HX OF 02/18/2008   Qualifier: Diagnosis of  By: Danny Lawless CMA, [place]    . Parkinson disease ([place]) 11/2014  . Sinus mucosal thickening    pt unable to lay flat  . Streptococcal meningitis    as an infant    Patient Active Problem List   Diagnosis Date Noted  . Chronic idiopathic constipation 03/18/2019  . Hallucination 02/22/2019  . Overactive bladder 02/04/2019  . Postoperative pain   . Labile blood pressure   . Dysphagia   . Episode of recurrent major depressive disorder ([place])   . Hypoalbuminemia due to  protein-calorie malnutrition ([place])   . Left displaced femoral neck fracture ([place]) 12/11/2018  . Closed fracture of neck of left femur ([place])   . Closed left hip fracture ([place]) 12/08/2018  . TMJ (temporomandibular joint syndrome) 10/10/2017  . Parkinson's disease (tremor, stiffness, slow motion, unstable posture) ([place]) 03/20/2017  . Bilateral lower extremity edema 03/20/2017  . Chronic venous insufficiency 09/13/2016  . Fatigue 03/15/2016  . Parkinsonian features 09/15/2015  . ED (erectile dysfunction) 09/15/2015  . MDD (major depressive disorder), recurrent episode, severe ([place]) 02/10/2014  . Nonspecific abnormal electrocardiogram (ECG) (EKG) 02/07/2014  . Non-compliant behavior 02/03/2014  . Morton's neuroma of left foot 08/07/2013  . 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 Hyperglycemia 01/26/2012  . Hypogonadism male 01/24/2012    Past Surgical History:  Procedure Laterality Date  . APPENDECTOMY  1967  . COLONOSCOPY    . NASAL SINUS SURGERY     x 4 as a child  . TOTAL HIP ARTHROPLASTY Left 12/09/2018   Procedure: TOTAL HIP ARTHROPLASTY ANTERIOR APPROACH;  Surgeon: Leandrew Koyanagi, MD;  Location: [facility];  Service: Orthopedics;  Laterality: Left;  Marland Kitchen VASECTOMY          Home Medications    Prior to Admission medications  Medication Sig Start Date End Date Taking? Authorizing Provider  B Complex-C (B-COMPLEX WITH VITAMIN C) tablet Take 1 tablet by mouth daily.   Yes [provider]  buPROPion (WELLBUTRIN XL) 150 MG 24 hr tablet Take 1 tablet (150 mg total) by mouth daily. 06/04/19 06/03/20 Yes Arfeen, Arlyce Harman, MD  buPROPion (WELLBUTRIN XL) 300 MG 24 hr tablet Take 1 tablet (300 mg total) by mouth every morning. 06/04/19  Yes Arfeen, Arlyce Harman, MD  carbidopa-levodopa (SINEMET IR) 25-100 MG tablet Take 1-2 tablets by mouth See admin instructions. Take 2 tabs at 7am, Take 2 tabs at 11 am, and Take 1 tabs at 4 pm. Patient taking differently: Take 1-2  tablets by mouth See admin instructions. Take 2 tabs at 7am, Take 2 tabs at 11 am, and Take 2 tabs at 4 pm. 04/25/19  Yes Tat, Eustace Quail, DO  citalopram (CELEXA) 40 MG tablet Take 1 tablet (40 mg total) by mouth daily. 06/04/19  Yes Arfeen, Arlyce Harman, MD  fesoterodine (TOVIAZ) 8 MG TB24 tablet Take 8 mg by mouth daily.   Yes [provider]  [name] (ZYPREXA) 2.5 MG tablet Take 1 tablet (2.5 mg total) by mouth at bedtime. 06/04/19  Yes Arfeen, Arlyce Harman, MD  Omega 3-6-9 Fatty Acids (OMEGA 3-6-9 PO) Take 1 capsule by mouth daily.    Yes [provider]  TURMERIC CURCUMIN PO Take 1 capsule by mouth daily.    Yes [provider]  rOPINIRole (REQUIP) 1 MG tablet Take 1 tablet (1 mg total) by mouth 3 (three) times daily. Patient not taking: Reported on 06/19/2019 01/04/19   Tat, Eustace Quail, DO    Family History Family History  Problem Relation Age of Onset  . Lung cancer Father   . Alzheimer's disease Father   . Hypertension Mother   . Atrial fibrillation Mother   . Alcohol abuse Brother   . Drug abuse Brother   . Healthy Sister   . Healthy Son   . Seizures Daughter   . Healthy Daughter   . Healthy Daughter   . Colon cancer Neg Hx   . Stomach cancer Neg Hx   . Rectal cancer Neg Hx   . Esophageal cancer Neg Hx     Social History Social History   Tobacco Use  . Smoking status: Never Smoker  . Smokeless tobacco: Never Used  Substance Use Topics  . Alcohol use: No    Alcohol/week: 0.0 standard drinks  . Drug use: No     Allergies   Patient has no known allergies.   Review of Systems Review of Systems  All other systems reviewed and are negative.    Physical Exam Updated Vital Signs BP (!) 115/59   Pulse 83   Temp 99.8 F (37.7 C) (Oral)   Resp (!) 23   SpO2 92%   Physical Exam Vitals signs and nursing note reviewed.  Constitutional:      Appearance: He is well-developed. He is not toxic-appearing or diaphoretic.  HENT:     Head: Normocephalic  and atraumatic.     Right Ear: External ear normal.     Left Ear: External ear normal.  Eyes:     General: No scleral icterus.       Right eye: No discharge.        Left eye: No discharge.     Conjunctiva/sclera: Conjunctivae normal.  Neck:     Musculoskeletal: Neck supple.     Trachea: No tracheal deviation.  Cardiovascular:     Rate and Rhythm: Normal rate and regular rhythm.  Pulmonary:     Effort: Pulmonary effort is normal. No respiratory distress.     Breath sounds: Normal breath sounds. No stridor. No wheezing or rales.  Abdominal:     General: Bowel sounds are normal. There is no distension.     Palpations: Abdomen is soft.     Tenderness: There is no abdominal tenderness. There is no guarding or rebound.  Musculoskeletal:        General: No tenderness.     Right shoulder: He exhibits no tenderness, no bony tenderness and no swelling.     Left shoulder: He exhibits no tenderness, no bony tenderness and no swelling.     Right wrist: He exhibits no tenderness, no bony tenderness and no swelling.     Left wrist: He exhibits no tenderness, no bony tenderness and no swelling.     Right hip: He exhibits normal range of motion, no tenderness, no bony tenderness and no swelling.     Left hip: He exhibits normal range of motion, no tenderness and no bony tenderness.     Right ankle: He exhibits no swelling. No tenderness.     Left ankle: He exhibits no swelling. No tenderness.     Cervical back: He exhibits no tenderness, no bony tenderness and no swelling.     Thoracic back: He exhibits no tenderness, no bony tenderness and no swelling.     Lumbar back: He exhibits no tenderness, no bony tenderness and no swelling.     Comments: Trace edema bilateral lower extremities  Skin:    General: Skin is warm and dry.     Findings: No rash.  Neurological:     Mental Status: He is alert.     Cranial Nerves: No cranial nerve deficit (no facial droop, extraocular movements intact, no  slurred speech).     Sensory: No sensory deficit.     Motor: Weakness, tremor and abnormal muscle tone present. No seizure activity.     Coordination: Coordination normal.     Comments: Generalized weakness although patient is able to grip with both hands, is also to slightly lift legs legs off the bed, he is not able to rollover on his own      ED Treatments / Results  Labs (all labs ordered are listed, but only abnormal results are displayed) Labs Reviewed  CBC WITH DIFFERENTIAL/PLATELET - Abnormal; Notable for the following components:      Result Value   RBC 3.48 (*)    Hemoglobin 10.7 (*)    HCT 32.3 (*)    Neutro Abs 9.2 (*)    Lymphs Abs 0.3 (*)    All other components within normal limits  COMPREHENSIVE METABOLIC PANEL - Abnormal; Notable for the following components:   Sodium 132 (*)    Glucose, Bld 125 (*)    Calcium 8.4 (*)    Albumin 3.1 (*)    AST 52 (*)    All other components within normal limits  URINALYSIS, ROUTINE W REFLEX MICROSCOPIC - Abnormal; Notable for the following components:   Color, Urine AMBER (*)    APPearance CLOUDY (*)    Hgb urine dipstick MODERATE (*)    Ketones, ur 5 (*)    Protein, ur 30 (*)    Nitrite POSITIVE (*)    Leukocytes,Ua LARGE (*)    WBC, UA >50 (*)    Bacteria, UA MANY (*)    All  other components within normal limits  CULTURE, BLOOD (ROUTINE X 2)  CULTURE, BLOOD (ROUTINE X 2)  URINE CULTURE  LACTIC ACID, PLASMA    EKG None  Radiology Dg Chest Portable 1 View  Result Date: 06/19/2019 CLINICAL DATA:  Fever EXAM: PORTABLE CHEST 1 VIEW COMPARISON:  12/14/2018 FINDINGS: Low lung volumes. Mild cardiomegaly. Mild airspace disease at the left base. No pneumothorax. IMPRESSION: 1. Mild airspace disease at the peripheral left base, atelectasis versus mild pneumonia 2. Low lung volumes.  Mild cardiomegaly Electronically Signed   By: Donavan Foil M.D.   On: 06/19/2019 20:28    Procedures Procedures (including critical care  time)  Medications Ordered in ED Medications  sodium chloride 0.9 % bolus 1,000 mL (0 mLs Intravenous Stopped 06/19/19 2241)    Followed by  0.9 %  sodium chloride infusion (1,000 mLs Intravenous New Bag/Given 06/19/19 2239)  cefTRIAXone (ROCEPHIN) 1 g in sodium chloride 0.9 % 100 mL IVPB (1 g Intravenous New Bag/Given 06/19/19 2238)     Initial Impression / Assessment and Plan / ED Course  I have reviewed the triage vital signs and the nursing notes.  Pertinent labs & imaging results that were available during my care of the patient were reviewed by me and considered in my medical decision making (see chart for details).  Clinical Course as of Jun 19 2323  Wed Jun 19, 2019  2209 Anemia noted but not significantly different than baseline.   [JK]  2209 CBC with Differential/Platelet(!) [JK]  2210 Urinalysis consistent with urinary tract infection.   [JK]  2218 DG Chest Portable 1 View [JK]    Clinical Course User Index [JK] Dorie Rank, MD     Patient presented to the ED with complaints of weakness and recent fever.  Patient's ED work-up is notable for urinary tract infection.  He is afebrile and nontoxic.  No signs of lactic acidosis but considering his weakness and high fevers at home I will consult with the medical service for admission.  Final Clinical Impressions(s) / ED Diagnoses   Final diagnoses:  Acute cystitis without hematuria  Weakness      Dorie Rank, MD 06/19/19 2325

## 2019-06-19 NOTE — Telephone Encounter (Addendum)
I called pt's wife- she states his sxs (fever) have resolved. She does not think pt has COVID19 and he will not be going for testing. She only called for FYI, but she is not concerned that this is COVID. She states he is unable to leave the house at this time.

## 2019-06-19 NOTE — Telephone Encounter (Signed)
Would recommend covid testing at drive through. Can place orders for him

## 2019-06-19 NOTE — Telephone Encounter (Signed)
Continuation from today Nurse Triage Telephone Encounter (unable to addendum)  Spouse (Goins,[name] ) home # [phone] would like a call back prior to 1pm to discuss sending [place] orders to [facility] due to nurse making a wellness home check visit today. Spouse is under the impression PCP is placing COVID orders but as per patient spouse patient is unable to leave the house. Patient has no fever, please advise

## 2019-06-19 NOTE — Telephone Encounter (Signed)
Phone call rec'd from pt's wife re: onset of fever of 102.7 at 7:30 PM, 9/15.  Reported she contacted [facility], and was advised to continue to monitor, and give Tylenol as needed.  Reported Tylenol 500 mg, 2 tablets given @ 8:00 PM, @ 2:00 AM, and @ 7:00 AM.  Last temp. checked at 7:00 AM, and was 99.5, orally.  Stated pt. Wears a condom catheter.  Reported his urine is "normal"; having good outputs. Denied any burning with urination, or urinary pressure/urgency.  Drank 2 liters H20 yesterday.  Able to stay hydrated.  Noted some decreased appetite last night, otherwise denied any cough, shortness of breath, sore throat, or body aches.  Denied any diarrhea.  Reported he had a suppository and stool softener, and had a large, formed BM yesterday. Wife stated he does not go out, and has limited exposure to anyone coming to the house; everyone wears a mask that comes to the house. Reported he does receive PT. Advised to continue to monitor temperature, keep pt. well hydrated, monitor for any worsening of symptoms.  Care advice give per protocol.  Wife encouraged to call back with any other concerns.  Verb. Understanding.  Advised will send Triage note to Dr. Sharlet Salina to make her aware.  Agreed with plan.   Reason for Disposition . [1] Fever AND [2] no signs of serious infection or localizing symptoms (all other triage questions negative)  Answer Assessment - Initial Assessment Questions 1. TEMPERATURE: "What is the most recent temperature?"  "How was it measured?"      99.5 orally/ digital  2. ONSET: "When did the fever start?"      7:30 PM 9/15 3. SYMPTOMS: "Do you have any other symptoms besides the fever?"  (e.g., colds, headache, sore throat, earache, cough, rash, diarrhea, vomiting, abdominal pain)     Denied sore throat, cough, body aches 4. CAUSE: If there are no symptoms, ask: "What do you think is causing the fever?"      unknown 5. CONTACTS: "Does anyone else in the family have an  infection?"     no 6. TREATMENT: "What have you done so far to treat this fever?" (e.g., medications)     Tylenol ES, 2 tabs per dose ; last dose was at 7:00 AM  7. IMMUNOCOMPROMISE: "Do you have of the following: diabetes, HIV positive, splenectomy, cancer chemotherapy, chronic steroid treatment, transplant patient, etc."     Hx of Parkinson's disease  8. PREGNANCY: "Is there any chance you are pregnant?" "When was your last menstrual period?"     N/a  9. TRAVEL: "Have you traveled out of the country in the last month?" (e.g., travel history, exposures)    No  Protocols used: FEVER-A-AH

## 2019-06-20 DIAGNOSIS — K5904 Chronic idiopathic constipation: Secondary | ICD-10-CM | POA: Diagnosis not present

## 2019-06-20 DIAGNOSIS — R278 Other lack of coordination: Secondary | ICD-10-CM | POA: Diagnosis not present

## 2019-06-20 DIAGNOSIS — M6281 Muscle weakness (generalized): Secondary | ICD-10-CM | POA: Diagnosis not present

## 2019-06-20 DIAGNOSIS — Z7401 Bed confinement status: Secondary | ICD-10-CM | POA: Diagnosis not present

## 2019-06-20 DIAGNOSIS — A419 Sepsis, unspecified organism: Secondary | ICD-10-CM | POA: Diagnosis present

## 2019-06-20 DIAGNOSIS — N3000 Acute cystitis without hematuria: Secondary | ICD-10-CM

## 2019-06-20 DIAGNOSIS — Z20828 Contact with and (suspected) exposure to other viral communicable diseases: Secondary | ICD-10-CM | POA: Diagnosis present

## 2019-06-20 DIAGNOSIS — Z79899 Other long term (current) drug therapy: Secondary | ICD-10-CM | POA: Diagnosis not present

## 2019-06-20 DIAGNOSIS — J181 Lobar pneumonia, unspecified organism: Secondary | ICD-10-CM | POA: Diagnosis not present

## 2019-06-20 DIAGNOSIS — R509 Fever, unspecified: Secondary | ICD-10-CM | POA: Diagnosis not present

## 2019-06-20 DIAGNOSIS — N390 Urinary tract infection, site not specified: Secondary | ICD-10-CM

## 2019-06-20 DIAGNOSIS — R5381 Other malaise: Secondary | ICD-10-CM | POA: Diagnosis not present

## 2019-06-20 DIAGNOSIS — R0902 Hypoxemia: Secondary | ICD-10-CM | POA: Diagnosis not present

## 2019-06-20 DIAGNOSIS — K5900 Constipation, unspecified: Secondary | ICD-10-CM | POA: Diagnosis present

## 2019-06-20 DIAGNOSIS — G20 Parkinson's disease: Secondary | ICD-10-CM

## 2019-06-20 DIAGNOSIS — M2550 Pain in unspecified joint: Secondary | ICD-10-CM | POA: Diagnosis not present

## 2019-06-20 DIAGNOSIS — L89152 Pressure ulcer of sacral region, stage 2: Secondary | ICD-10-CM | POA: Diagnosis present

## 2019-06-20 DIAGNOSIS — J189 Pneumonia, unspecified organism: Secondary | ICD-10-CM | POA: Diagnosis present

## 2019-06-20 DIAGNOSIS — R52 Pain, unspecified: Secondary | ICD-10-CM | POA: Diagnosis not present

## 2019-06-20 DIAGNOSIS — R41841 Cognitive communication deficit: Secondary | ICD-10-CM | POA: Diagnosis not present

## 2019-06-20 DIAGNOSIS — R531 Weakness: Secondary | ICD-10-CM | POA: Diagnosis not present

## 2019-06-20 DIAGNOSIS — A4151 Sepsis due to Escherichia coli [E. coli]: Secondary | ICD-10-CM | POA: Diagnosis present

## 2019-06-20 DIAGNOSIS — Z96642 Presence of left artificial hip joint: Secondary | ICD-10-CM | POA: Diagnosis present

## 2019-06-20 DIAGNOSIS — Z6835 Body mass index (BMI) 35.0-35.9, adult: Secondary | ICD-10-CM | POA: Diagnosis not present

## 2019-06-20 DIAGNOSIS — F329 Major depressive disorder, single episode, unspecified: Secondary | ICD-10-CM | POA: Diagnosis present

## 2019-06-20 DIAGNOSIS — E669 Obesity, unspecified: Secondary | ICD-10-CM | POA: Diagnosis present

## 2019-06-20 DIAGNOSIS — R2689 Other abnormalities of gait and mobility: Secondary | ICD-10-CM | POA: Diagnosis not present

## 2019-06-20 DIAGNOSIS — Z23 Encounter for immunization: Secondary | ICD-10-CM | POA: Diagnosis not present

## 2019-06-20 DIAGNOSIS — L8990 Pressure ulcer of unspecified site, unspecified stage: Secondary | ICD-10-CM | POA: Insufficient documentation

## 2019-06-20 DIAGNOSIS — R32 Unspecified urinary incontinence: Secondary | ICD-10-CM | POA: Diagnosis present

## 2019-06-20 DIAGNOSIS — F339 Major depressive disorder, recurrent, unspecified: Secondary | ICD-10-CM | POA: Diagnosis not present

## 2019-06-20 LAB — HIV ANTIBODY (ROUTINE TESTING W REFLEX): HIV Screen 4th Generation wRfx: NONREACTIVE

## 2019-06-20 LAB — CBC
HCT: 32.5 % — ABNORMAL LOW (ref 39.0–52.0)
Hemoglobin: 10.5 g/dL — ABNORMAL LOW (ref 13.0–17.0)
MCH: 30.3 pg (ref 26.0–34.0)
MCHC: 32.3 g/dL (ref 30.0–36.0)
MCV: 93.9 fL (ref 80.0–100.0)
Platelets: 237 10*3/uL (ref 150–400)
RBC: 3.46 MIL/uL — ABNORMAL LOW (ref 4.22–5.81)
RDW: 13 % (ref 11.5–15.5)
WBC: 8.1 10*3/uL (ref 4.0–10.5)
nRBC: 0 % (ref 0.0–0.2)

## 2019-06-20 LAB — SARS CORONAVIRUS 2 BY RT PCR (HOSPITAL ORDER, PERFORMED IN ~~LOC~~ HOSPITAL LAB): SARS Coronavirus 2: NEGATIVE

## 2019-06-20 LAB — BASIC METABOLIC PANEL
Anion gap: 11 (ref 5–15)
BUN: 13 mg/dL (ref 8–23)
CO2: 19 mmol/L — ABNORMAL LOW (ref 22–32)
Calcium: 8.4 mg/dL — ABNORMAL LOW (ref 8.9–10.3)
Chloride: 105 mmol/L (ref 98–111)
Creatinine, Ser: 1.07 mg/dL (ref 0.61–1.24)
GFR calc Af Amer: >60 mL/min (ref >60)
GFR calc non Af Amer: >60 mL/min (ref >60)
Glucose, Bld: 105 mg/dL — ABNORMAL HIGH (ref 70–99)
Potassium: 3.4 mmol/L — ABNORMAL LOW (ref 3.5–5.1)
Sodium: 135 mmol/L (ref 135–145)

## 2019-06-20 LAB — NOVEL CORONAVIRUS, NAA: SARS-CoV-2, NAA: NOT DETECTED

## 2019-06-20 MED ORDER — AZITHROMYCIN 250 MG PO TABS
500.0000 mg | ORAL_TABLET | Freq: Every day | ORAL | Status: AC
  Administered 2019-06-20: 500 mg via ORAL
  Filled 2019-06-20: qty 2

## 2019-06-20 MED ORDER — BUPROPION HCL ER (XL) 150 MG PO TB24
150.0000 mg | ORAL_TABLET | Freq: Every day | ORAL | Status: DC
  Administered 2019-06-20 – 2019-06-23 (×4): 150 mg via ORAL
  Filled 2019-06-20 (×4): qty 1

## 2019-06-20 MED ORDER — BISACODYL 10 MG RE SUPP
10.0000 mg | Freq: Once | RECTAL | Status: AC
  Administered 2019-06-20: 18:00:00 10 mg via RECTAL
  Filled 2019-06-20: qty 1

## 2019-06-20 MED ORDER — ENOXAPARIN SODIUM 40 MG/0.4ML ~~LOC~~ SOLN
40.0000 mg | SUBCUTANEOUS | Status: DC
  Administered 2019-06-20 – 2019-06-24 (×4): 40 mg via SUBCUTANEOUS
  Filled 2019-06-20 (×4): qty 0.4

## 2019-06-20 MED ORDER — PNEUMOCOCCAL VAC POLYVALENT 25 MCG/0.5ML IJ INJ
0.5000 mL | INJECTION | INTRAMUSCULAR | Status: DC
  Filled 2019-06-20: qty 0.5

## 2019-06-20 MED ORDER — FESOTERODINE FUMARATE ER 8 MG PO TB24
8.0000 mg | ORAL_TABLET | Freq: Every day | ORAL | Status: DC
  Administered 2019-06-20 – 2019-06-23 (×4): 8 mg via ORAL
  Filled 2019-06-20 (×5): qty 1

## 2019-06-20 MED ORDER — OLANZAPINE 2.5 MG PO TABS
2.5000 mg | ORAL_TABLET | Freq: Every day | ORAL | Status: DC
  Administered 2019-06-20 – 2019-06-23 (×4): 2.5 mg via ORAL
  Filled 2019-06-20 (×5): qty 1

## 2019-06-20 MED ORDER — ONDANSETRON HCL 4 MG PO TABS: 4.0000 mg | ORAL_TABLET | Freq: Four times a day (QID) | ORAL | Status: DC | PRN

## 2019-06-20 MED ORDER — FESOTERODINE FUMARATE ER 8 MG PO TB24
8.0000 mg | ORAL_TABLET | Freq: Every day | ORAL | Status: DC
  Filled 2019-06-20: qty 1

## 2019-06-20 MED ORDER — ONDANSETRON HCL 4 MG/2ML IJ SOLN: 4.0000 mg | Freq: Four times a day (QID) | INTRAMUSCULAR | Status: DC | PRN

## 2019-06-20 MED ORDER — CARBIDOPA-LEVODOPA 25-100 MG PO TABS
2.0000 | ORAL_TABLET | ORAL | Status: DC
  Administered 2019-06-20 – 2019-06-24 (×14): 2 via ORAL
  Filled 2019-06-20 (×14): qty 2

## 2019-06-20 MED ORDER — BUPROPION HCL ER (XL) 150 MG PO TB24
300.0000 mg | ORAL_TABLET | Freq: Every day | ORAL | Status: DC
  Filled 2019-06-20: qty 2

## 2019-06-20 MED ORDER — ACETAMINOPHEN 650 MG RE SUPP: 650.0000 mg | Freq: Four times a day (QID) | RECTAL | Status: DC | PRN

## 2019-06-20 MED ORDER — POTASSIUM CHLORIDE CRYS ER 20 MEQ PO TBCR
40.0000 meq | EXTENDED_RELEASE_TABLET | Freq: Once | ORAL | Status: AC
  Administered 2019-06-20: 40 meq via ORAL
  Filled 2019-06-20: qty 2

## 2019-06-20 MED ORDER — CITALOPRAM HYDROBROMIDE 10 MG PO TABS
40.0000 mg | ORAL_TABLET | Freq: Every day | ORAL | Status: DC
  Filled 2019-06-20: qty 4

## 2019-06-20 MED ORDER — AZITHROMYCIN 250 MG PO TABS
250.0000 mg | ORAL_TABLET | Freq: Every day | ORAL | Status: AC
  Administered 2019-06-21 – 2019-06-24 (×4): 250 mg via ORAL
  Filled 2019-06-20 (×4): qty 1

## 2019-06-20 MED ORDER — ACETAMINOPHEN 325 MG PO TABS: 650.0000 mg | ORAL_TABLET | Freq: Four times a day (QID) | ORAL | Status: DC | PRN

## 2019-06-20 MED ORDER — SODIUM CHLORIDE 0.9 % IV SOLN
2.0000 g | INTRAVENOUS | Status: DC
  Administered 2019-06-20 – 2019-06-23 (×4): 2 g via INTRAVENOUS
  Filled 2019-06-20 (×2): qty 2
  Filled 2019-06-20: qty 20
  Filled 2019-06-20: qty 2
  Filled 2019-06-20: qty 20

## 2019-06-20 MED ORDER — SODIUM CHLORIDE 0.9 % IV SOLN
1.0000 g | Freq: Once | INTRAVENOUS | Status: AC
  Administered 2019-06-20: 02:00:00 1 g via INTRAVENOUS
  Filled 2019-06-20: qty 10

## 2019-06-20 MED ORDER — CITALOPRAM HYDROBROMIDE 20 MG PO TABS
40.0000 mg | ORAL_TABLET | Freq: Every day | ORAL | Status: DC
  Administered 2019-06-20 – 2019-06-23 (×4): 40 mg via ORAL
  Filled 2019-06-20 (×4): qty 2

## 2019-06-20 MED ORDER — BUPROPION HCL ER (XL) 150 MG PO TB24: 150.0000 mg | ORAL_TABLET | Freq: Every day | ORAL | Status: DC

## 2019-06-20 NOTE — H&P (Signed)
History and Physical    [name] [name] [id number] DOB: 07-01-1951 DOA: 06/19/2019  PCP: [name], MD  Patient coming from: Home  I have personally briefly reviewed patient's old medical records in [place]  Chief Complaint: Fall, fever  HPI: [name] [name] is a 68 y.o. male with medical history significant of Parkinson's disease.  Patient had fever of 102 last evening.  Wife called PCP, got sent over to [facility] drive through for COVID testing.  No known exposures to COVID though as he doesn't go anywhere other than doctors appointments.  Today trying to get up to walk to bathroom, became very weak and unable to stand.  EMS called.  No cough, no vomiting, no diarrhea, no reported rashes.  Has some neck pain, but no stiffness, had headache earlier but none now he says.  No injuries from sliding out of chair.   ED Course: UA is very positive for UTI with large LE, positive Nitrites > 50 WBCs, cloudy, etc.  WBC 10k.  Does have mild LLL airspace disease, actelectasis vs PNA.   Review of Systems: As per HPI, otherwise all review of systems negative.  Past Medical History:  Diagnosis Date  . APPENDECTOMY, HX OF 02/18/2008   Qualifier: Diagnosis of  By: Danny Lawless CMA, [place]    . Cardiac murmur    as a child  . Chronic idiopathic constipation   . Depression   . Headache(784.0)   . HEADACHES, HX OF 02/18/2008   Qualifier: Diagnosis of  By: Danny Lawless CMA, [place]    . Parkinson disease ([place]) 11/2014  . Sinus mucosal thickening    pt unable to lay flat  . Streptococcal meningitis    as an infant    Past Surgical History:  Procedure Laterality Date  . APPENDECTOMY  1967  . COLONOSCOPY    . NASAL SINUS SURGERY     x 4 as a child  . TOTAL HIP ARTHROPLASTY Left 12/09/2018   Procedure: TOTAL HIP ARTHROPLASTY ANTERIOR APPROACH;  Surgeon: Leandrew Koyanagi, MD;  Location: [facility];  Service: Orthopedics;  Laterality: Left;  Marland Kitchen VASECTOMY       reports that he has  never smoked. He has never used smokeless tobacco. He reports that he does not drink alcohol or use drugs.  No Known Allergies  Family History  Problem Relation Age of Onset  . Lung cancer Father   . Alzheimer's disease Father   . Hypertension Mother   . Atrial fibrillation Mother   . Alcohol abuse Brother   . Drug abuse Brother   . Healthy Sister   . Healthy Son   . Seizures Daughter   . Healthy Daughter   . Healthy Daughter   . Colon cancer Neg Hx   . Stomach cancer Neg Hx   . Rectal cancer Neg Hx   . Esophageal cancer Neg Hx      Prior to Admission medications   Medication Sig Start Date End Date Taking? Authorizing Provider  B Complex-C (B-COMPLEX WITH VITAMIN C) tablet Take 1 tablet by mouth daily.   Yes [provider]  buPROPion (WELLBUTRIN XL) 150 MG 24 hr tablet Take 1 tablet (150 mg total) by mouth daily. 06/04/19 06/03/20 Yes Arfeen, Arlyce Harman, MD  buPROPion (WELLBUTRIN XL) 300 MG 24 hr tablet Take 1 tablet (300 mg total) by mouth every morning. 06/04/19  Yes Arfeen, Arlyce Harman, MD  carbidopa-levodopa (SINEMET IR) 25-100 MG tablet Take 1-2 tablets by mouth See admin instructions.  Take 2 tabs at 7am, Take 2 tabs at 11 am, and Take 1 tabs at 4 pm. Patient taking differently: Take 1-2 tablets by mouth See admin instructions. Take 2 tabs at 7am, Take 2 tabs at 11 am, and Take 2 tabs at 4 pm. 04/25/19  Yes Tat, Eustace Quail, DO  citalopram (CELEXA) 40 MG tablet Take 1 tablet (40 mg total) by mouth daily. 06/04/19  Yes Arfeen, Arlyce Harman, MD  fesoterodine (TOVIAZ) 8 MG TB24 tablet Take 8 mg by mouth daily.   Yes [provider]  [name] (ZYPREXA) 2.5 MG tablet Take 1 tablet (2.5 mg total) by mouth at bedtime. 06/04/19  Yes Arfeen, Arlyce Harman, MD  Omega 3-6-9 Fatty Acids (OMEGA 3-6-9 PO) Take 1 capsule by mouth daily.    Yes [provider]  TURMERIC CURCUMIN PO Take 1 capsule by mouth daily.    Yes [provider]    Physical Exam: Vitals:   06/19/19 2130  06/19/19 2200 06/19/19 2230 06/19/19 2330  BP: 120/68 119/68 (!) 115/59 113/64  Pulse: 87 86 83 81  Resp: (!) 23 (!) 26 (!) 23 (!) 24  Temp:      TempSrc:      SpO2: 92% (!) 87% 92% 91%    Constitutional: NAD, calm, comfortable Eyes: PERRL, lids and conjunctivae normal ENMT: Mucous membranes are moist. Posterior pharynx clear of any exudate or lesions.Normal dentition.  Neck: normal, supple, no masses, no thyromegaly Respiratory: clear to auscultation bilaterally, no wheezing, no crackles. Normal respiratory effort. No accessory muscle use.  Cardiovascular: Regular rate and rhythm, no murmurs / rubs / gallops. Trace extremity edema. 2+ pedal pulses. No carotid bruits.  Abdomen: no tenderness, no masses palpated. No hepatosplenomegaly. Bowel sounds positive.  Musculoskeletal: no clubbing / cyanosis. No joint deformity upper and lower extremities. Good ROM, no contractures. Normal muscle tone.  Skin: mild erythema to face Neurologic: generalized weakness, parkinson tremor Psychiatric: Normal judgment and insight. Alert and oriented x 3. Normal mood.    Labs on Admission: I have personally reviewed following labs and imaging studies  CBC: Recent Labs  Lab 06/19/19 2004  WBC 10.3  NEUTROABS 9.2*  HGB 10.7*  HCT 32.3*  MCV 92.8  PLT 123456   Basic Metabolic Panel: Recent Labs  Lab 06/19/19 2004  NA 132*  K 3.9  CL 99  CO2 22  GLUCOSE 125*  BUN 17  CREATININE 1.20  CALCIUM 8.4*   GFR: CrCl cannot be calculated (Unknown ideal weight.). Liver Function Tests: Recent Labs  Lab 06/19/19 2004  AST 52*  ALT 10  ALKPHOS 83  BILITOT 0.8  PROT 6.7  ALBUMIN 3.1*   No results for input(s): LIPASE, AMYLASE in the last 168 hours. No results for input(s): AMMONIA in the last 168 hours. Coagulation Profile: No results for input(s): INR, PROTIME in the last 168 hours. Cardiac Enzymes: No results for input(s): CKTOTAL, CKMB, CKMBINDEX, TROPONINI in the last 168 hours. BNP  (last 3 results) No results for input(s): PROBNP in the last 8760 hours. HbA1C: No results for input(s): HGBA1C in the last 72 hours. CBG: No results for input(s): GLUCAP in the last 168 hours. Lipid Profile: No results for input(s): CHOL, HDL, LDLCALC, TRIG, CHOLHDL, LDLDIRECT in the last 72 hours. Thyroid Function Tests: No results for input(s): TSH, T4TOTAL, FREET4, T3FREE, THYROIDAB in the last 72 hours. Anemia Panel: No results for input(s): VITAMINB12, FOLATE, FERRITIN, TIBC, IRON, RETICCTPCT in the last 72 hours. Urine analysis:    Component  Value Date/Time   COLORURINE AMBER (A) 06/19/2019 2004   APPEARANCEUR CLOUDY (A) 06/19/2019 2004   LABSPEC 1.014 06/19/2019 2004   LABSPEC 1.027 03/29/2019   PHURINE 5.0 06/19/2019 2004   GLUCOSEU NEGATIVE 06/19/2019 2004   GLUCOSEU Neg 02/08/2019   HGBUR MODERATE (A) 06/19/2019 2004   BILIRUBINUR NEGATIVE 06/19/2019 2004   KETONESUR 5 (A) 06/19/2019 2004   PROTEINUR 30 (A) 06/19/2019 2004   UROBILINOGEN Normal 03/29/2019   NITRITE POSITIVE (A) 06/19/2019 2004   LEUKOCYTESUR LARGE (A) 06/19/2019 2004    Radiological Exams on Admission: Dg Chest Portable 1 View  Result Date: 06/19/2019 CLINICAL DATA:  Fever EXAM: PORTABLE CHEST 1 VIEW COMPARISON:  12/14/2018 FINDINGS: Low lung volumes. Mild cardiomegaly. Mild airspace disease at the left base. No pneumothorax. IMPRESSION: 1. Mild airspace disease at the peripheral left base, atelectasis versus mild pneumonia 2. Low lung volumes.  Mild cardiomegaly Electronically Signed   By: Donavan Foil M.D.   On: 06/19/2019 20:28    EKG: Independently reviewed.  Assessment/Plan Principal Problem:   Acute lower UTI Active Problems:   Community acquired pneumonia of left lower lobe of lung ([place])   Parkinson's disease (tremor, stiffness, slow motion, unstable posture) ([facility])   Fever    1. Acute lower UTI with fever - 1. Rocephin 2. UCx pending 3. BCx pending 4. Repeat CBC / BMP in AM 5.  IVF: 1L bolus then 125 cc/hr NS 6. Tylenol PRN fever 2. LLL CAP vs actelectasis - 1. Will add azithromycin for CAP coverage 2. Will also obtain COVID testing and admit as PUI in the meantime till this results. 3. Parkinson's disease - 1. Continue sinemet 2. Also continue welbutrin and zyprexa.  DVT prophylaxis: Lovenox Code Status: Full Family Communication: No family in room Disposition Plan: Home after admit Consults called: None Admission status: Place in obs, likely convert to [place], [place] Hospitalists  How to contact the [facility] Attending or Consulting provider [place] or covering provider during after hours [place], for this patient?  1. Check the care team in [facility] and look for a) attending/consulting [facility] provider listed and b) the [facility] team listed 2. Log into [email or web address]  Amion Physician Scheduling and messaging for groups and whole hospitals  On call and physician scheduling software for group practices, residents, hospitalists and other medical providers for call, clinic, rotation and shift schedules. OnCall [employer] is a hospital-wide system for scheduling doctors and paging doctors on call. EasyPlot is for scientific plotting and data analysis.  [email or web address]  and use [place]'s universal password to access. If you do not have the password, please contact the hospital operator.  3. Locate the [facility] provider you are looking for under [facility] and page to a number that you can be directly reached. 4. If you still have difficulty reaching the provider, please page the [facility] (Director on Call) for the Hospitalists listed on amion for assistance.  06/20/2019, 12:30 AM

## 2019-06-20 NOTE — Progress Notes (Signed)
PROGRESS NOTE    [name] STRADFORD  [id number] DOB: 1951/04/14 DOA: 06/19/2019 PCP: [name], MD    Brief Narrative:   [name] [name] is a 68 y.o. male with medical history significant of Parkinson's disease.  Patient had fever of 102 last evening. Wife called PCP, got sent over to [facility] drive through for COVID testing.  No known exposures to COVID though as he doesn't go anywhere other than doctors appointments. Today trying to get up to walk to bathroom, became very weak and unable to stand.  EMS called.  No cough, no vomiting, no diarrhea, no reported rashes. Has some neck pain, but no stiffness, had headache earlier but none now he says.  No injuries from sliding out of chair.  ED Course: UA is very positive for UTI with large LE, positive Nitrites > 50 WBCs, cloudy, etc. WBC 10k. Does have mild LLL airspace disease, actelectasis vs PNA.  Assessment & Plan:   Principal Problem:   Acute lower UTI Active Problems:   Community acquired pneumonia of left lower lobe of lung ([place])   Parkinson's disease (tremor, stiffness, slow motion, unstable posture) ([facility])   Fever   Pressure injury of skin   Sepsis ([place])  Sepsis Urinary tract infection Suspected community-acquired pneumonia Patient presenting from home after increased lethargy and fever up to 102.0.  The BC count elevated to 10.3 on admission.  Urinalysis notable for large leukoesterase, positive nitrite, WBCs greater than 50 with many bacteria.  Also noted on chest x-ray mild airspace disease left base consistent with atelectasis versus pneumonia.  COVID-19 test was negative. --Blood cultures x2: Pending --Urine culture: Pending --Continue antibiotics with azithromycin and ceftriaxone --Supportive care, antipyretics  Parkinson's disease --Sinemet IR 2 tabs at 0700, 100, 1600  Depression --Wellbutrin 300mg  qam and 150mg  at 2100  Urinary incontinence --Toviaz 8mg  PO daily at 1800  Weakness/debility:  --PT OT consultation  Stage II sacral pressure ulcer, present on admission --Continue local wound care --Frequent position changes  Obesity BMI 35.38.  Discussed with patient need for lifestyle changes and weight loss measures as this complicates all facets of care.   DVT prophylaxis: Lovenox Code Status: Full code Family Communication: Discussed with spouse present at bedside Disposition Plan: Admit inpatient, awaiting further culture results, further depending on clinical course   Consultants:   none  Procedures:   None  Antimicrobials:   Azithromycin 9/16>>  Ceftriaxone 9/16>>   Subjective: Patient seen and examined at bedside, resting comfortably.  Upset regarding amount of time it took for him to get into a regular room.  Spouse present at bedside this morning, she was concerned regarding the actual timing of her husband's medications and was terse to the nursing staff regarding this.  Discussed extensively that her husband is very sick with a high fever on presentation with a significant urinary tract infection and possible commune acquired pneumonia.  Patient states he continues to feel weak and fatigued.  No other complaints at this time.  Denies headache, no chills/night sweats, no nauseous or vomit/diarrhea, no chest pain, no palpitations, no shortness of breath, no abdominal pain, no congestion, no paresthesia.  No acute events overnight per nursing staff.  Objective: Vitals:   06/20/19 0530 06/20/19 0602 06/20/19 0613 06/20/19 0621  BP: 129/78   115/68  Pulse: 84   81  Resp: (!) 26  18 20   Temp:    99.5 F (37.5 C)  TempSrc:    Oral  SpO2: 90%   92%  Weight:  125 kg    Height:  6\' 2"  (1.88 m)      Intake/Output Summary (Last 24 hours) at 06/20/2019 1255 Last data filed at 06/20/2019 1253 Gross per 24 hour  Intake 1160 ml  Output 2051 ml  Net -891 ml   Filed Weights   06/20/19 0602  Weight: 125 kg    Examination:  General exam: Appears calm and  comfortable, obese Respiratory system: Clear to auscultation. Respiratory effort normal.  Oxygenating well on room air Cardiovascular system: S1 & S2 heard, RRR. No JVD, murmurs, rubs, gallops or clicks. No pedal edema. Gastrointestinal system: Abdomen is nondistended, soft and nontender. No organomegaly or masses felt. Normal bowel sounds heard. Central nervous system: Alert and oriented.  Parkinsonian tremor noted Extremities: Symmetric 5 x 5 power. Skin: No rashes, lesions or ulcers Psychiatry: Judgement and insight appear normal. Mood & affect appropriate.     Data Reviewed: I have personally reviewed following labs and imaging studies  CBC: Recent Labs  Lab 06/19/19 2004 06/20/19 0609  WBC 10.3 8.1  NEUTROABS 9.2*  --   HGB 10.7* 10.5*  HCT 32.3* 32.5*  MCV 92.8 93.9  PLT 253 [id number]   Basic Metabolic Panel: Recent Labs  Lab 06/19/19 2004 06/20/19 0609  NA 132* 135  K 3.9 3.4*  CL 99 105  CO2 22 19*  GLUCOSE 125* 105*  BUN 17 13  CREATININE 1.20 1.07  CALCIUM 8.4* 8.4*   GFR: Estimated Creatinine Clearance: 96.7 mL/min (by C-G formula based on SCr of 1.07 mg/dL). Liver Function Tests: Recent Labs  Lab 06/19/19 2004  AST 52*  ALT 10  ALKPHOS 83  BILITOT 0.8  PROT 6.7  ALBUMIN 3.1*   No results for input(s): LIPASE, AMYLASE in the last 168 hours. No results for input(s): AMMONIA in the last 168 hours. Coagulation Profile: No results for input(s): INR, PROTIME in the last 168 hours. Cardiac Enzymes: No results for input(s): CKTOTAL, CKMB, CKMBINDEX, TROPONINI in the last 168 hours. BNP (last 3 results) No results for input(s): PROBNP in the last 8760 hours. HbA1C: No results for input(s): HGBA1C in the last 72 hours. CBG: No results for input(s): GLUCAP in the last 168 hours. Lipid Profile: No results for input(s): CHOL, HDL, LDLCALC, TRIG, CHOLHDL, LDLDIRECT in the last 72 hours. Thyroid Function Tests: No results for input(s): TSH, T4TOTAL, FREET4,  T3FREE, THYROIDAB in the last 72 hours. Anemia Panel: No results for input(s): VITAMINB12, FOLATE, FERRITIN, TIBC, IRON, RETICCTPCT in the last 72 hours. Sepsis Labs: Recent Labs  Lab 06/19/19 2004  LATICACIDVEN 1.9    Recent Results (from the past 240 hour(s))  Blood culture (routine x 2)     Status: None (Preliminary result)   Collection Time: 06/19/19  8:04 PM   Specimen: BLOOD  Result Value Ref Range Status   Specimen Description   Final    BLOOD BLOOD LEFT FOREARM Performed at [facility] [address] [place], [place] [address]    Special Requests   Final    BOTTLES DRAWN AEROBIC AND ANAEROBIC Blood Culture adequate volume Performed at [facility] [address], [place], [place] [address]    Culture   Final    NO GROWTH < 12 HOURS Performed at [facility] [address] [place], [place] [address]    Report Status PENDING  Incomplete  Blood culture (routine x 2)     Status: None (Preliminary result)   Collection Time: 06/19/19  8:09 PM   Specimen: BLOOD RIGHT HAND  Result Value Ref Range Status   Specimen Description   Final    BLOOD RIGHT HAND Performed at [facility] [address] [place], [place] [address]    Special Requests   Final    BOTTLES DRAWN AEROBIC AND ANAEROBIC Blood Culture results may not be optimal due to an inadequate volume of blood received in culture bottles Performed at [facility] [address] [place], [place] [address]    Culture   Final    NO GROWTH < 12 HOURS Performed at [facility] [address], [place], [place] [address]    Report Status PENDING  Incomplete  SARS Coronavirus 2 [facility] order, Performed in [facility] hospital lab) Nasopharyngeal Nasopharyngeal Swab     Status: None   Collection Time: 06/20/19  2:05 AM   Specimen: Nasopharyngeal Swab  Result Value Ref Range Status   SARS Coronavirus 2 NEGATIVE NEGATIVE Final     Comment: (NOTE) If result is NEGATIVE SARS-CoV-2 target nucleic acids are NOT DETECTED. The SARS-CoV-2 RNA is generally detectable in upper and lower  respiratory specimens during the acute phase of infection. The lowest  concentration of SARS-CoV-2 viral copies this assay can detect is 250  copies / mL. A negative result does not preclude SARS-CoV-2 infection  and should not be used as the sole basis for treatment or other  patient management decisions.  A negative result may occur with  improper specimen collection / handling, submission of specimen other  than nasopharyngeal swab, presence of viral mutation(s) within the  areas targeted by this assay, and inadequate number of viral copies  (<250 copies / mL). A negative result must be combined with clinical  observations, patient history, and epidemiological information. If result is POSITIVE SARS-CoV-2 target nucleic acids are DETECTED. The SARS-CoV-2 RNA is generally detectable in upper and lower  respiratory specimens dur ing the acute phase of infection.  Positive  results are indicative of active infection with SARS-CoV-2.  Clinical  correlation with patient history and other diagnostic information is  necessary to determine patient infection status.  Positive results do  not rule out bacterial infection or co-infection with other viruses. If result is PRESUMPTIVE POSTIVE SARS-CoV-2 nucleic acids MAY BE PRESENT.   A presumptive positive result was obtained on the submitted specimen  and confirmed on repeat testing.  While 2019 novel coronavirus  (SARS-CoV-2) nucleic acids may be present in the submitted sample  additional confirmatory testing may be necessary for epidemiological  and / or clinical management purposes  to differentiate between  SARS-CoV-2 and other Sarbecovirus currently known to infect humans.  If clinically indicated additional testing with an alternate test  methodology [phone]) is advised. The  SARS-CoV-2 RNA is generally  detectable in upper and lower respiratory sp ecimens during the acute  phase of infection. The expected result is Negative. Fact Sheet for Patients:  StrictlyIdeas.no Fact Sheet for Healthcare Providers: BankingDealers.co.za This test is not yet approved or cleared by the [place] FDA and has been authorized for detection and/or diagnosis of SARS-CoV-2 by FDA under an Emergency Use Authorization (EUA).  This EUA will remain in effect (meaning this test can be used) for the duration of the COVID-19 declaration under Section 564(b)(1) of the Act, 21 U.S.C. section 360bbb-3(b)(1), unless the authorization is terminated or revoked sooner. Performed at [facility], [place] [address], [place], [place] [address]  Radiology Studies: Dg Chest Portable 1 View  Result Date: 06/19/2019 CLINICAL DATA:  Fever EXAM: PORTABLE CHEST 1 VIEW COMPARISON:  12/14/2018 FINDINGS: Low lung volumes. Mild cardiomegaly. Mild airspace disease at the left base. No pneumothorax. IMPRESSION: 1. Mild airspace disease at the peripheral left base, atelectasis versus mild pneumonia 2. Low lung volumes.  Mild cardiomegaly Electronically Signed   By: Donavan Foil M.D.   On: 06/19/2019 20:28        Scheduled Meds: . [START ON 06/21/2019] azithromycin  250 mg Oral Daily  . buPROPion  150 mg Oral QHS  . buPROPion  300 mg Oral Daily  . carbidopa-levodopa  2 tablet Oral 3 times per day  . citalopram  40 mg Oral Daily  . enoxaparin (LOVENOX) injection  40 mg Subcutaneous Q24H  . fesoterodine  8 mg Oral Daily  . [name]  2.5 mg Oral QHS  . [START ON 06/21/2019] pneumococcal 23 valent vaccine  0.5 mL Intramuscular Tomorrow-1000   Continuous Infusions: . sodium chloride 1,000 mL (06/19/19 2239)  . cefTRIAXone (ROCEPHIN)  IV       LOS: 0 days    Time spent: 39 minutes spent on chart review, discussion with  nursing staff, consultants, personally reviewing all imaging studies and labs, updating family and interview/physical exam; more than 50% of that time was spent in counseling and/or coordination of care.  Severity of Illness: The appropriate patient status for this patient is INPATIENT. Inpatient status is judged to be reasonable and necessary in order to provide the required intensity of service to ensure the patient's safety. The patient's presenting symptoms, physical exam findings, and initial radiographic and laboratory data in the context of their chronic comorbidities is felt to place them at high risk for further clinical deterioration. Furthermore, it is not anticipated that the patient will be medically stable for discharge from the hospital within 2 midnights of admission. The following factors support the patient status of inpatient.   " The patient's presenting symptoms include fever, weakness, malaise, fall " The worrisome physical exam findings include fever " The initial radiographic and laboratory data are worrisome because of elevated WBC count, urinalysis consistent with UTI, chest x-ray with left lower lobe airspace disease concerning for pneumonia " The chronic co-morbidities include urinary incontinence, obesity, Parkinson's disease.   * I certify that at the point of admission it is my clinical judgment that the patient will require inpatient hospital care spanning beyond 2 midnights from the point of admission due to high intensity of service, high risk for further deterioration and high frequency of surveillance required.Donnamarie Poag [place] DO [facility] Pager [phone]  If 7PM-7AM, please contact night-coverage [email or web address] [facility] 06/20/2019, 12:55 PM

## 2019-06-20 NOTE — Evaluation (Signed)
Occupational Therapy Evaluation Patient Details Name: [name] [name] MRN: [id number] DOB: [birth year] Today's Date: 06/20/2019    History of Present Illness 68 year old man admitted with fever and weakness.  Found to have uti and possible pna.  PMH: Parkinson's Disease,    Clinical Impression   Pt was admitted for the above. At baseline, he lives with wife and 2 children. Wife assists with LB adls and provides min guard initially when he stands for safety. Pt walks with RW and ascends a flight of stairs daily to take a shower.  He stands in the shower with stick on grab bars.  Pt was able to stand with min A from a high surface with steady today. Will follow in acute setting with min guard level goals to return to baselien for toileting/standing for adls     Follow Up Recommendations  Supervision/Assistance - 24 hour;Home health OT(depending upon progress)    Equipment Recommendations  None recommended by OT    Recommendations for Other Services       Precautions / Restrictions Precautions Precautions: Fall Restrictions Weight Bearing Restrictions: No      Mobility Bed Mobility               General bed mobility comments: pt sleeps in lift chair at home:  mod +2 to lie down. Pt unable to scoot backwards on bed  Transfers Overall transfer level: Needs assistance Equipment used: (stedy) Transfers: Sit to/from Stand Sit to Stand: Min assist         General transfer comment: from high bed surface    Balance                                           ADL either performed or assessed with clinical judgement   ADL Overall ADL's : Needs assistance/impaired                                       General ADL Comments: Pt was up to [facility] with NTs using steady.  Stood using this after pt returned to sitting EOB. He usually uses a RW. Wife assists with LB adls at baseline.  Based on clinical judgment, min A for UB adls and max for LB.      Vision         Perception     Praxis      Pertinent Vitals/Pain Pain Assessment: No/denies pain     Hand Dominance Right   Extremity/Trunk Assessment Upper Extremity Assessment Upper Extremity Assessment: Overall WFL for tasks assessed(not shaky at time of eval:  intermittent)           Communication Communication Communication: No difficulties   Cognition Arousal/Alertness: Awake/alert Behavior During Therapy: WFL for tasks assessed/performed                                   General Comments: needed some repetition/restating with cues.  Intermittent slow processing   General Comments       Exercises     Shoulder Instructions      Home Living Family/patient expects to be discharged to:: Private residence Living Arrangements: Spouse/significant other;Children Available Help at Discharge: Family;Available 24 hours/day Type of Home: House Home Access:  Stairs to enter [employer] of Steps: 8-9 Entrance Stairs-Rails: Right Home Layout: Two level;Bed/bath upstairs;1/2 bath on main level(pt sleeps downstairs in lift chair) Alternate Level Stairs-Number of Steps: flight   Bathroom Shower/Tub: Occupational psychologist: Standard     Home Equipment: Environmental consultant - 2 wheels(also has quad and single point cane; has BSC, doesn't like)   Additional Comments: sleeps in lift chair.  2 children living with them.  1/2 bath on main level      Prior Functioning/Environment Level of Independence: Needs assistance  Gait / Transfers Assistance Needed: min guard for safety ADL's / Homemaking Assistance Needed: Pt's wife assists with LB adls, sometimes feeding if pt is shaky   Comments: pt is a retired Company secretary.  pt walks upstairs to take a shower.  They have clamp on grab bars x 3 in shower; does not like/use 3:1 commode        OT Problem List: Decreased activity tolerance;Impaired balance (sitting and/or standing);Decreased knowledge of  use of DME or AE;Decreased strength      OT Treatment/Interventions: Self-care/ADL training;DME and/or AE instruction;Therapeutic activities;Patient/family education;Balance training;Energy conservation    OT Goals(Current goals can be found in the care plan section) Acute Rehab OT Goals Patient Stated Goal: return to PLOF OT Goal Formulation: With patient Time For Goal Achievement: 07/04/19 Potential to Achieve Goals: Good ADL Goals Pt Will Transfer to Toilet: with min guard assist;ambulating(high toilet) Additional ADL Goal #1: pt will go from sit to stand with min guard with RW and maintain at this level for 2 minutes for adls  OT Frequency: Min 2X/week   Barriers to D/C:            Co-evaluation              AM-PAC OT "6 Clicks" Daily Activity     Outcome Measure Help from another person eating meals?: A Little Help from another person taking care of personal grooming?: A Little Help from another person toileting, which includes using toliet, bedpan, or urinal?: A Lot Help from another person bathing (including washing, rinsing, drying)?: A Lot Help from another person to put on and taking off regular upper body clothing?: A Little Help from another person to put on and taking off regular lower body clothing?: A Lot 6 Click Score: 15   End of Session    Activity Tolerance: Patient tolerated treatment well Patient left: in bed;with call bell/phone within reach;with bed alarm set  OT Visit Diagnosis: Unsteadiness on feet (R26.81)                Time: [id number] OT Time Calculation (min): 23 min Charges:  OT General Charges $OT Visit: 1 Visit OT Evaluation $OT Eval Low Complexity: [place], OTR/L Acute Rehabilitation Services [phone] [facility] pager [phone] office 06/20/2019  [place] 06/20/2019, 3:51 PM

## 2019-06-20 NOTE — ED Notes (Signed)
Pt claims he brought his glasses to ED. Not found in room with pt before transporting to [place].  Pt's belongings included: shoes, pants and shirt.

## 2019-06-20 NOTE — ED Notes (Signed)
Pt asking this nurse to look for his glasses, unable to locate them and stated his wife may have taken them with her. Patients shirt, shorts, and shoes brought upstairs.

## 2019-06-20 NOTE — Progress Notes (Signed)
Pt "s wife Joycelyn Schmid called to request  that his carbidopa is given  exactly at 0700 am ,11 am and 4 pm. ."Only give 30 min before food or 30 min after food". She stated "Wellbutrin is to be given at 9 pm exactly. Celexa 40 mg is to be given at exactly 9 pm.". she requested not to give him any stool softeners especially 'Colace" and no enemas .TOVIAZ 8 MG AT 6 PM". Writer will notify MD and pharmacy

## 2019-06-21 LAB — BASIC METABOLIC PANEL
Anion gap: 7 (ref 5–15)
BUN: 13 mg/dL (ref 8–23)
CO2: 23 mmol/L (ref 22–32)
Calcium: 8.6 mg/dL — ABNORMAL LOW (ref 8.9–10.3)
Chloride: 109 mmol/L (ref 98–111)
Creatinine, Ser: 0.99 mg/dL (ref 0.61–1.24)
GFR calc Af Amer: >60 mL/min (ref >60)
GFR calc non Af Amer: >60 mL/min (ref >60)
Glucose, Bld: 105 mg/dL — ABNORMAL HIGH (ref 70–99)
Potassium: 3.5 mmol/L (ref 3.5–5.1)
Sodium: 139 mmol/L (ref 135–145)

## 2019-06-21 LAB — CBC
HCT: 31.7 % — ABNORMAL LOW (ref 39.0–52.0)
Hemoglobin: 10.3 g/dL — ABNORMAL LOW (ref 13.0–17.0)
MCH: 30.3 pg (ref 26.0–34.0)
MCHC: 32.5 g/dL (ref 30.0–36.0)
MCV: 93.2 fL (ref 80.0–100.0)
Platelets: 245 10*3/uL (ref 150–400)
RBC: 3.4 MIL/uL — ABNORMAL LOW (ref 4.22–5.81)
RDW: 13.1 % (ref 11.5–15.5)
WBC: 6.5 10*3/uL (ref 4.0–10.5)
nRBC: 0 % (ref 0.0–0.2)

## 2019-06-21 LAB — MAGNESIUM: Magnesium: 2.1 mg/dL (ref 1.7–2.4)

## 2019-06-21 MED ORDER — POTASSIUM CHLORIDE CRYS ER 20 MEQ PO TBCR
40.0000 meq | EXTENDED_RELEASE_TABLET | Freq: Once | ORAL | Status: AC
  Administered 2019-06-21: 40 meq via ORAL
  Filled 2019-06-21: qty 2

## 2019-06-21 MED ORDER — BUPROPION HCL ER (XL) 300 MG PO TB24
300.0000 mg | ORAL_TABLET | Freq: Every day | ORAL | Status: DC
  Administered 2019-06-21 – 2019-06-24 (×4): 300 mg via ORAL
  Filled 2019-06-21 (×4): qty 1

## 2019-06-21 NOTE — Progress Notes (Signed)
PROGRESS NOTE    [name] [name]  [id number] DOB: 11-25-1950 DOA: 06/19/2019 PCP: [name], MD    Brief Narrative:   [name] [name] is a 68 y.o. male with medical history significant of Parkinson's disease.  Patient had fever of 102 last evening. Wife called PCP, got sent over to [facility] drive through for COVID testing.  No known exposures to COVID though as he doesn't go anywhere other than doctors appointments. Today trying to get up to walk to bathroom, became very weak and unable to stand.  EMS called.  No cough, no vomiting, no diarrhea, no reported rashes. Has some neck pain, but no stiffness, had headache earlier but none now he says.  No injuries from sliding out of chair.  ED Course: UA is very positive for UTI with large LE, positive Nitrites > 50 WBCs, cloudy, etc. WBC 10k. Does have mild LLL airspace disease, actelectasis vs PNA.  Assessment & Plan:   Principal Problem:   Acute lower UTI Active Problems:   Community acquired pneumonia of left lower lobe of lung ([place])   Parkinson's disease (tremor, stiffness, slow motion, unstable posture) ([facility])   Fever   Pressure injury of skin   Sepsis ([place])  Sepsis Ecoli Urinary tract infection Suspected community-acquired pneumonia Patient presenting from home after increased lethargy and fever up to 102.0.  The BC count elevated to 10.3 on admission.  Urinalysis notable for large leukoesterase, positive nitrite, WBCs greater than 50 with many bacteria.  Also noted on chest x-ray mild airspace disease left base consistent with atelectasis versus pneumonia.  COVID-19 test was negative. --Blood cultures x2: no growth <12h --Urine culture: Ecoli, susceptibilities pending --Continue antibiotics with azithromycin and ceftriaxone --Supportive care, antipyretics  Parkinson's disease --Sinemet IR 2 tabs at 0700, 1000, 1600  Depression --Wellbutrin 300mg  0700 and 150mg  at 2100  Urinary incontinence --Toviaz 8mg  PO  daily at 1800  Weakness/debility: --OT recommends home health  Stage II sacral pressure ulcer, present on admission --Continue local wound care --Frequent position changes  Obesity BMI 35.38.  Discussed with patient need for lifestyle changes and weight loss measures as this complicates all facets of care.   DVT prophylaxis: Lovenox Code Status: Full code Family Communication: non present at bedside Disposition Plan: Admit inpatient, awaiting further culture results, further depending on clinical course   Consultants:   none  Procedures:   None  Antimicrobials:   Azithromycin 9/16>>  Ceftriaxone 9/16>>   Subjective: Patient seen and examined at bedside, resting comfortably.  Upset regarding about why still in the hospital, reiterated to them once again today on multiple occasions that we are waiting for his culture susceptibilities return to ascertain to which antibiotic we will be able to discharge him home on.  No family present at bedside this morning. Patient states he continues to feel weak and fatigued.  No other complaints at this time.  Denies headache, no chills/night sweats, no nauseous or vomit/diarrhea, no chest pain, no palpitations, no shortness of breath, no abdominal pain, no congestion, no paresthesia.  No acute events overnight per nursing staff.  Objective: Vitals:   06/20/19 0621 06/20/19 1401 06/20/19 2157 06/21/19 0659  BP: 115/68 118/70 129/77 111/75  Pulse: 81 79 78 74  Resp: 20 18 19 16   Temp: 99.5 F (37.5 C) 98.7 F (37.1 C) 98.7 F (37.1 C) 98.5 F (36.9 C)  TempSrc: Oral Oral Oral Oral  SpO2: 92% 93% 92% 93%  Weight:      Height:  Intake/Output Summary (Last 24 hours) at 06/21/2019 1140 Last data filed at 06/21/2019 0956 Gross per 24 hour  Intake 960 ml  Output 2354 ml  Net -1394 ml   Filed Weights   06/20/19 0602  Weight: 125 kg    Examination:  General exam: Appears calm and comfortable, obese Respiratory system:  Clear to auscultation. Respiratory effort normal.  Oxygenating well on room air Cardiovascular system: S1 & S2 heard, RRR. No JVD, murmurs, rubs, gallops or clicks. No pedal edema. Gastrointestinal system: Abdomen is nondistended, soft and nontender. No organomegaly or masses felt. Normal bowel sounds heard. Central nervous system: Alert and oriented.  Parkinsonian tremor noted Extremities: Symmetric 5 x 5 power. Skin: No rashes, lesions or ulcers Psychiatry: Judgement and insight appear normal. Mood & affect appropriate.     Data Reviewed: I have personally reviewed following labs and imaging studies  CBC: Recent Labs  Lab 06/19/19 2004 06/20/19 0609 06/21/19 0454  WBC 10.3 8.1 6.5  NEUTROABS 9.2*  --   --   HGB 10.7* 10.5* 10.3*  HCT 32.3* 32.5* 31.7*  MCV 92.8 93.9 93.2  PLT 253 237 [id number]   Basic Metabolic Panel: Recent Labs  Lab 06/19/19 2004 06/20/19 0609 06/21/19 0454  NA 132* 135 139  K 3.9 3.4* 3.5  CL 99 105 109  CO2 22 19* 23  GLUCOSE 125* 105* 105*  BUN 17 13 13   CREATININE 1.20 1.07 0.99  CALCIUM 8.4* 8.4* 8.6*  MG  --   --  2.1   GFR: Estimated Creatinine Clearance: 104.5 mL/min (by C-G formula based on SCr of 0.99 mg/dL). Liver Function Tests: Recent Labs  Lab 06/19/19 2004  AST 52*  ALT 10  ALKPHOS 83  BILITOT 0.8  PROT 6.7  ALBUMIN 3.1*   No results for input(s): LIPASE, AMYLASE in the last 168 hours. No results for input(s): AMMONIA in the last 168 hours. Coagulation Profile: No results for input(s): INR, PROTIME in the last 168 hours. Cardiac Enzymes: No results for input(s): CKTOTAL, CKMB, CKMBINDEX, TROPONINI in the last 168 hours. BNP (last 3 results) No results for input(s): PROBNP in the last 8760 hours. HbA1C: No results for input(s): HGBA1C in the last 72 hours. CBG: No results for input(s): GLUCAP in the last 168 hours. Lipid Profile: No results for input(s): CHOL, HDL, LDLCALC, TRIG, CHOLHDL, LDLDIRECT in the last 72 hours.  Thyroid Function Tests: No results for input(s): TSH, T4TOTAL, FREET4, T3FREE, THYROIDAB in the last 72 hours. Anemia Panel: No results for input(s): VITAMINB12, FOLATE, FERRITIN, TIBC, IRON, RETICCTPCT in the last 72 hours. Sepsis Labs: Recent Labs  Lab 06/19/19 2004  LATICACIDVEN 1.9    Recent Results (from the past 240 hour(s))  Novel Coronavirus, NAA ([facility])     Status: None   Collection Time: 06/19/19 12:00 AM   Specimen: Oropharyngeal(OP) collection in vial transport medium   OROPHARYNGEA  TESTING  Result Value Ref Range Status   SARS-CoV-2, NAA Not Detected Not Detected Final    Comment: This nucleic acid amplification test was developed and its performance characteristics determined by [employer]. Nucleic acid amplification tests include PCR and TMA. This test has not been FDA cleared or approved. This test has been authorized by FDA under an Emergency Use Authorization (EUA). This test is only authorized for the duration of time the declaration that circumstances exist justifying the authorization of the emergency use of in vitro diagnostic tests for detection of SARS-CoV-2 virus and/or diagnosis of COVID-19 infection under section 564(b)(1)  of the Act, 21 U.S.C. [id number]) (1), unless the authorization is terminated or revoked sooner. When diagnostic testing is negative, the possibility of a false negative result should be considered in the context of a patient's recent exposures and the presence of clinical signs and symptoms consistent with COVID-19. An individual without symptoms of COVID-19 and who is not shedding SARS-CoV-2 virus would  expect to have a negative (not detected) result in this assay.   Blood culture (routine x 2)     Status: None (Preliminary result)   Collection Time: 06/19/19  8:04 PM   Specimen: BLOOD  Result Value Ref Range Status   Specimen Description   Final    BLOOD BLOOD LEFT FOREARM Performed at [facility] [address] [place], [place] [address]    Special Requests   Final    BOTTLES DRAWN AEROBIC AND ANAEROBIC Blood Culture adequate volume Performed at [facility] [address], [place], [place] [address]    Culture   Final    NO GROWTH < 12 HOURS Performed at [facility] [address] [place], [place] [address]    Report Status PENDING  Incomplete  Blood culture (routine x 2)     Status: None (Preliminary result)   Collection Time: 06/19/19  8:09 PM   Specimen: BLOOD RIGHT HAND  Result Value Ref Range Status   Specimen Description   Final    BLOOD RIGHT HAND Performed at [facility] [address], [place], [place] [address]    Special Requests   Final    BOTTLES DRAWN AEROBIC AND ANAEROBIC Blood Culture results may not be optimal due to an inadequate volume of blood received in culture bottles Performed at [facility] [address], [place], [place] [address]    Culture   Final    NO GROWTH < 12 HOURS Performed at [facility] [address] [place], [place] [address]    Report Status PENDING  Incomplete  Urine Culture     Status: Abnormal (Preliminary result)   Collection Time: 06/19/19 10:14 PM   Specimen: Urine  Result Value Ref Range Status   Specimen Description   Final    Urine Performed at [facility] [address] [place], [place] [address]    Special Requests   Final    NONE Performed at [facility], [place] [address], [place], [place] [address]    Culture (A)  Final    >=100,000 COLONIES/mL ESCHERICHIA COLI SUSCEPTIBILITIES TO FOLLOW Performed at [facility], [place] [address] [place],  [address]    Report Status PENDING  Incomplete  SARS Coronavirus 2 [facility] order, Performed in [facility] hospital lab) Nasopharyngeal Nasopharyngeal Swab     Status: None   Collection Time: 06/20/19  2:05 AM   Specimen:  Nasopharyngeal Swab  Result Value Ref Range Status   SARS Coronavirus 2 NEGATIVE NEGATIVE Final    Comment: (NOTE) If result is NEGATIVE SARS-CoV-2 target nucleic acids are NOT DETECTED. The SARS-CoV-2 RNA is generally detectable in upper and lower  respiratory specimens during the acute phase of infection. The lowest  concentration of SARS-CoV-2 viral copies this assay can detect is 250  copies / mL. A negative result does not preclude SARS-CoV-2 infection  and should not be used as the sole basis for treatment or other  patient management decisions.  A negative result may occur with  improper  specimen collection / handling, submission of specimen other  than nasopharyngeal swab, presence of viral mutation(s) within the  areas targeted by this assay, and inadequate number of viral copies  (<250 copies / mL). A negative result must be combined with clinical  observations, patient history, and epidemiological information. If result is POSITIVE SARS-CoV-2 target nucleic acids are DETECTED. The SARS-CoV-2 RNA is generally detectable in upper and lower  respiratory specimens dur ing the acute phase of infection.  Positive  results are indicative of active infection with SARS-CoV-2.  Clinical  correlation with patient history and other diagnostic information is  necessary to determine patient infection status.  Positive results do  not rule out bacterial infection or co-infection with other viruses. If result is PRESUMPTIVE POSTIVE SARS-CoV-2 nucleic acids MAY BE PRESENT.   A presumptive positive result was obtained on the submitted specimen  and confirmed on repeat testing.  While 2019 novel coronavirus  (SARS-CoV-2) nucleic acids may be present in the submitted sample  additional confirmatory testing may be necessary for epidemiological  and / or clinical management purposes  to differentiate between  SARS-CoV-2 and other Sarbecovirus currently known to infect humans.  If clinically  indicated additional testing with an alternate test  methodology [phone]) is advised. The SARS-CoV-2 RNA is generally  detectable in upper and lower respiratory sp ecimens during the acute  phase of infection. The expected result is Negative. Fact Sheet for Patients:  StrictlyIdeas.no Fact Sheet for Healthcare Providers: BankingDealers.co.za This test is not yet approved or cleared by the [place] FDA and has been authorized for detection and/or diagnosis of SARS-CoV-2 by FDA under an Emergency Use Authorization (EUA).  This EUA will remain in effect (meaning this test can be used) for the duration of the COVID-19 declaration under Section 564(b)(1) of the Act, 21 U.S.C. section 360bbb-3(b)(1), unless the authorization is terminated or revoked sooner. Performed at [facility], [place] [address], [place], [place] [address]          Radiology Studies: Dg Chest Portable 1 View  Result Date: 06/19/2019 CLINICAL DATA:  Fever EXAM: PORTABLE CHEST 1 VIEW COMPARISON:  12/14/2018 FINDINGS: Low lung volumes. Mild cardiomegaly. Mild airspace disease at the left base. No pneumothorax. IMPRESSION: 1. Mild airspace disease at the peripheral left base, atelectasis versus mild pneumonia 2. Low lung volumes.  Mild cardiomegaly Electronically Signed   By: Donavan Foil M.D.   On: 06/19/2019 20:28        Scheduled Meds: . azithromycin  250 mg Oral Daily  . buPROPion  150 mg Oral QHS  . buPROPion  300 mg Oral Daily  . carbidopa-levodopa  2 tablet Oral 3 times per day  . citalopram  40 mg Oral Daily  . enoxaparin (LOVENOX) injection  40 mg Subcutaneous Q24H  . fesoterodine  8 mg Oral Daily  . [name]  2.5 mg Oral QHS  . pneumococcal 23 valent vaccine  0.5 mL Intramuscular Tomorrow-1000   Continuous Infusions: . sodium chloride 1,000 mL (06/19/19 2239)  . cefTRIAXone (ROCEPHIN)  IV 2 g (06/20/19 2104)     LOS: 1 day     Time spent: 35 minutes spent on chart review, discussion with nursing staff, consultants, personally reviewing all imaging studies and labs, updating family and interview/physical exam; more than 50% of that time was spent in counseling and/or coordination of care.    Kadence Mikkelson J [place] DO [facility] Pager [phone]  If 7PM-7AM, please contact night-coverage [email or web address] Password [email or web address] 06/21/2019, 11:40 AM

## 2019-06-21 NOTE — Evaluation (Signed)
Physical Therapy Evaluation Patient Details Name: [name] [name] MRN: [id number] DOB: 07/22/1951 Today's Date: 06/21/2019   History of Present Illness  68 year old man admitted with fever and weakness.  Found to have uti and possible pna.  PMH: Parkinson's Disease,   Clinical Impression  CEOLA MANIA is a 68 y.o. male admitted with above HPI. He requires assistance from his wife at home for all ADL's and supervision for mobility with RW. Pt's wife reports ~2 days ago the patient was ambulating up/down stairs at home and walking wit RW without difficulty. Pt had increased confusion today secondary to infection and required repeated cue to complete mobility tasks. Pt required mod assist +2 for bed mobility and min assist for sit<>stand transfers with stedy today. He will continue to benefit from skilled PT interventions to progress towards goals and progress towards PLOF. PT would require 24/7 assist and would benefit form HHPT if he progresses well versus SF placement. Acute PT will progress as able.    Follow Up Recommendations SNF;Home health PT;Supervision/Assistance - 24 hour(pending progress pt may be able to return home with 24/7 assist and HHPT)    Equipment Recommendations  None recommended by PT    Recommendations for Other Services       Precautions / Restrictions Precautions Precautions: Fall Restrictions Weight Bearing Restrictions: No      Mobility  Bed Mobility Overal bed mobility: Needs Assistance Bed Mobility: Supine to Sit     Supine to sit: +2 for physical assistance;Mod assist     General bed mobility comments: mod assist with 2+ for safety, pt required repeated cues to sequence rolling and UE reaching to bed rails, veral/tactile cues used  Transfers Overall transfer level: Needs assistance Equipment used: (stedy) Transfers: Sit to/from Stand Sit to Stand: Min assist;From elevated surface         General transfer comment: min assist to cue for  safe hand placement on stedy, min assist to initaite power up from EOB (elevated), pt able to initiate power up without assist from stedy (performed 5x) pt also performed sit<>stand with stedy to [facility]  Ambulation/Gait             General Gait Details: NT  Stairs            Wheelchair Mobility    Modified Rankin (Stroke Patients Only)       Balance Overall balance assessment: Needs assistance Sitting-balance support: Feet supported;Bilateral upper extremity supported Sitting balance-Leahy Scale: Poor Sitting balance - Comments: pt relieant on bil UE support to maitian upright sitting     Standing balance-Leahy Scale: Poor Standing balance comment: reliant on UE support to maintain standing balance                Pertinent Vitals/Pain Pain Assessment: No/denies pain    Home Living Family/patient expects to be discharged to:: Private residence Living Arrangements: Spouse/significant other;Children Available Help at Discharge: Family;Available 24 hours/day Type of Home: House Home Access: Stairs to enter Entrance Stairs-Rails: Right Entrance Stairs-Number of Steps: 8-9 Home Layout: Two level;Bed/bath upstairs;1/2 bath on main level(pt sleeps downstairs in lift chair) Home Equipment: Walker - 2 wheels(also has quad and single point cane; has BSC, doesn't like) Additional Comments: sleeps in lift chair.  2 children living with them.  1/2 bath on main level    Prior Function Level of Independence: Needs assistance   Gait / Transfers Assistance Needed: min guard for safety  ADL's / Homemaking Assistance Needed: Pt's wife assists  with LB adls, sometimes feeding if pt is shaky  Comments: pt is a retired Company secretary.  pt walks upstairs to take a shower.  They have clamp on grab bars x 3 in shower; does not like/use 3:1 commode     Hand Dominance   Dominant Hand: Right    Extremity/Trunk Assessment   Upper Extremity Assessment Upper Extremity Assessment: Defer to  OT evaluation(pt with UE tremors)    Lower Extremity Assessment Lower Extremity Assessment: Generalized weakness       Communication   Communication: No difficulties  Cognition Arousal/Alertness: Awake/alert Behavior During Therapy: WFL for tasks assessed/performed Overall Cognitive Status: Impaired/Different from baseline Area of Impairment: Attention;Orientation                 Orientation Level: Disoriented to;Situation;Place Current Attention Level: Alternating           General Comments: needed some repetition for cues.  Intermittent slow processing; pt with visual hallucinations during therapy bu able to re-oriented to situation      General Comments      Exercises     Assessment/Plan    PT Assessment Patient needs continued PT services  PT Problem List Decreased balance;Decreased strength;Decreased mobility;Decreased knowledge of use of DME;Decreased activity tolerance;Decreased safety awareness       PT Treatment Interventions      PT Goals (Current goals can be found in the Care Plan section)  Acute Rehab PT Goals Patient Stated Goal: return to PLOF PT Goal Formulation: With patient/family Time For Goal Achievement: 07/05/19 Potential to Achieve Goals: Good    Frequency Min 4X/week    AM-PAC PT "6 Clicks" Mobility  Outcome Measure Help needed turning from your back to your side while in a flat bed without using bedrails?: Total Help needed moving from lying on your back to sitting on the side of a flat bed without using bedrails?: Total Help needed moving to and from a bed to a chair (including a wheelchair)?: Total Help needed standing up from a chair using your arms (e.g., wheelchair or bedside chair)?: Total Help needed to walk in hospital room?: Total Help needed climbing 3-5 steps with a railing? : Total 6 Click Score: 6    End of Session Equipment Utilized During Treatment: Gait belt Activity Tolerance: Patient tolerated treatment  well Patient left: in chair;with call bell/phone within reach;with chair alarm set;with family/visitor present Nurse Communication: Mobility status PT Visit Diagnosis: Unsteadiness on feet (R26.81);Difficulty in walking, not elsewhere classified (R26.2);Muscle weakness (generalized) (M62.81);Other abnormalities of gait and mobility (R26.89)    Time: [id number] PT Time Calculation (min) (ACUTE ONLY): 26 min   Charges:   PT Evaluation $PT Eval Moderate Complexity: 1 Mod PT Treatments $Therapeutic Activity: 8-22 mins        Kipp Brood, PT, DPT, [facility] Physical Therapist with [facility]  06/21/2019 3:43 PM

## 2019-06-22 LAB — CBC
HCT: 31.3 % — ABNORMAL LOW (ref 39.0–52.0)
Hemoglobin: 10.1 g/dL — ABNORMAL LOW (ref 13.0–17.0)
MCH: 30.2 pg (ref 26.0–34.0)
MCHC: 32.3 g/dL (ref 30.0–36.0)
MCV: 93.7 fL (ref 80.0–100.0)
Platelets: 281 10*3/uL (ref 150–400)
RBC: 3.34 MIL/uL — ABNORMAL LOW (ref 4.22–5.81)
RDW: 13.2 % (ref 11.5–15.5)
WBC: 5.3 10*3/uL (ref 4.0–10.5)
nRBC: 0 % (ref 0.0–0.2)

## 2019-06-22 LAB — BLOOD CULTURE ID PANEL (REFLEXED)

## 2019-06-22 LAB — BASIC METABOLIC PANEL
Anion gap: 10 (ref 5–15)
BUN: 13 mg/dL (ref 8–23)
CO2: 24 mmol/L (ref 22–32)
Calcium: 8.6 mg/dL — ABNORMAL LOW (ref 8.9–10.3)
Chloride: 107 mmol/L (ref 98–111)
Creatinine, Ser: 1 mg/dL (ref 0.61–1.24)
GFR calc Af Amer: >60 mL/min (ref >60)
GFR calc non Af Amer: >60 mL/min (ref >60)
Glucose, Bld: 102 mg/dL — ABNORMAL HIGH (ref 70–99)
Potassium: 3.8 mmol/L (ref 3.5–5.1)
Sodium: 141 mmol/L (ref 135–145)

## 2019-06-22 LAB — URINE CULTURE: Culture: >=100000 — AB

## 2019-06-22 LAB — MAGNESIUM: Magnesium: 2.2 mg/dL (ref 1.7–2.4)

## 2019-06-22 MED ORDER — CITALOPRAM HYDROBROMIDE 40 MG PO TABS: 20.0000 mg | ORAL_TABLET | Freq: Every day | ORAL | Status: DC

## 2019-06-22 MED ORDER — CEFDINIR 300 MG PO CAPS: 300.0000 mg | ORAL_CAPSULE | Freq: Two times a day (BID) | ORAL | 0 refills | Status: DC

## 2019-06-22 MED ORDER — AZITHROMYCIN 250 MG PO TABS: 250.0000 mg | ORAL_TABLET | Freq: Every day | ORAL | 0 refills | Status: DC

## 2019-06-22 NOTE — Progress Notes (Signed)
PROGRESS NOTE    [name] [name]  [id number] DOB: September 06, 1951 DOA: 06/19/2019 PCP: [name], MD   Brief Narrative: [name] [name] is a 68 y.o. malewith medical history significant ofParkinson's disease. Patient presented secondary to fevers and weakness and found to have evidence of sepsis physiology in setting of UTI and pneumonia.   Assessment & Plan:   Principal Problem:   Acute lower UTI Active Problems:   Community acquired pneumonia of left lower lobe of lung ([place])   Parkinson's disease (tremor, stiffness, slow motion, unstable posture) (HCC)   Fever   Pressure injury of skin   Sepsis ([place])   Sepsis Secondary to urinary tract infection. Physiology resolved. Present on admission. Blood culture significant for staph species in 1/4 bottles; likely contaminant  E coli UTI Pansensitive. Empirically treated with Ceftriaxone and will continue until discharge to SNF  Possible pneumonia Empiric treatment with Ceftriaxone and azithromycin -Continue treatment  Depression -Continue Wellbutrin  Parkinson disease -Continue Sinemet  Urinary incontinence -Continue Toviaz  Obesity Body mass index is 35.38 kg/m.  Pressure injury Medial sacrum. POA   DVT prophylaxis: Lovenox Code Status:   Code Status: Full Code Family Communication: Wife at bedside Disposition Plan: Discharge to SNF   Consultants:   None  Procedures:   None  Antimicrobials:  Ceftriaxone  Azithromycin    Subjective: Slept this morning/afternoon.  Objective: Vitals:   06/21/19 1235 06/21/19 2111 06/22/19 0724 06/22/19 1351  BP: 106/70 135/83 131/78 111/73  Pulse: 76 82 61 79  Resp: 15 16 20 12   Temp: 99.6 F (37.6 C) 98.2 F (36.8 C)  (!) 97.4 F (36.3 C)  TempSrc: Oral Oral  Oral  SpO2: 94% 95% 93% 93%  Weight:      Height:        Intake/Output Summary (Last 24 hours) at 06/22/2019 1729 Last data filed at 06/22/2019 1400 Gross per 24 hour  Intake  1540.49 ml  Output 1300 ml  Net 240.49 ml   Filed Weights   06/20/19 0602  Weight: 125 kg    Examination:  General exam: Appears calm and comfortable Respiratory system: Clear to auscultation. Respiratory effort normal. Cardiovascular system: S1 & S2 heard, RRR. No murmurs, rubs, gallops or clicks. Gastrointestinal system: Abdomen is nondistended, soft and nontender. No organomegaly or masses felt. Normal bowel sounds heard. Central nervous system: Alert. Extremities: No edema. No calf tenderness Skin: No cyanosis. No rashes    Data Reviewed: I have personally reviewed following labs and imaging studies  CBC: Recent Labs  Lab 06/19/19 2004 06/20/19 0609 06/21/19 0454 06/22/19 0433  WBC 10.3 8.1 6.5 5.3  NEUTROABS 9.2*  --   --   --   HGB 10.7* 10.5* 10.3* 10.1*  HCT 32.3* 32.5* 31.7* 31.3*  MCV 92.8 93.9 93.2 93.7  PLT 253 237 245 [id number]   Basic Metabolic Panel: Recent Labs  Lab 06/19/19 2004 06/20/19 0609 06/21/19 0454 06/22/19 0433  NA 132* 135 139 141  K 3.9 3.4* 3.5 3.8  CL 99 105 109 107  CO2 22 19* 23 24  GLUCOSE 125* 105* 105* 102*  BUN 17 13 13 13   CREATININE 1.20 1.07 0.99 1.00  CALCIUM 8.4* 8.4* 8.6* 8.6*  MG  --   --  2.1 2.2   GFR: Estimated Creatinine Clearance: 103.4 mL/min (by C-G formula based on SCr of 1 mg/dL). Liver Function Tests: Recent Labs  Lab 06/19/19 2004  AST 52*  ALT 10  ALKPHOS 83  BILITOT 0.8  PROT 6.7  ALBUMIN 3.1*   No results for input(s): LIPASE, AMYLASE in the last 168 hours. No results for input(s): AMMONIA in the last 168 hours. Coagulation Profile: No results for input(s): INR, PROTIME in the last 168 hours. Cardiac Enzymes: No results for input(s): CKTOTAL, CKMB, CKMBINDEX, TROPONINI in the last 168 hours. BNP (last 3 results) No results for input(s): PROBNP in the last 8760 hours. HbA1C: No results for input(s): HGBA1C in the last 72 hours. CBG: No results for input(s): GLUCAP in the last 168 hours.  Lipid Profile: No results for input(s): CHOL, HDL, LDLCALC, TRIG, CHOLHDL, LDLDIRECT in the last 72 hours. Thyroid Function Tests: No results for input(s): TSH, T4TOTAL, FREET4, T3FREE, THYROIDAB in the last 72 hours. Anemia Panel: No results for input(s): VITAMINB12, FOLATE, FERRITIN, TIBC, IRON, RETICCTPCT in the last 72 hours. Sepsis Labs: Recent Labs  Lab 06/19/19 2004  LATICACIDVEN 1.9    Recent Results (from the past 240 hour(s))  Novel Coronavirus, NAA ([facility])     Status: None   Collection Time: 06/19/19 12:00 AM   Specimen: Oropharyngeal(OP) collection in vial transport medium   OROPHARYNGEA  TESTING  Result Value Ref Range Status   SARS-CoV-2, NAA Not Detected Not Detected Final    Comment: This nucleic acid amplification test was developed and its performance characteristics determined by [employer]. Nucleic acid amplification tests include PCR and TMA. This test has not been FDA cleared or approved. This test has been authorized by FDA under an Emergency Use Authorization (EUA). This test is only authorized for the duration of time the declaration that circumstances exist justifying the authorization of the emergency use of in vitro diagnostic tests for detection of SARS-CoV-2 virus and/or diagnosis of COVID-19 infection under section 564(b)(1) of the Act, 21 U.S.C. [id number]) (1), unless the authorization is terminated or revoked sooner. When diagnostic testing is negative, the possibility of a false negative result should be considered in the context of a patient's recent exposures and the presence of clinical signs and symptoms consistent with COVID-19. An individual without symptoms of COVID-19 and who is not shedding SARS-CoV-2 virus would  expect to have a negative (not detected) result in this assay.   Blood culture (routine x 2)     Status: None (Preliminary result)   Collection Time: 06/19/19  8:04 PM   Specimen: BLOOD  Result Value Ref  Range Status   Specimen Description   Final    BLOOD BLOOD LEFT FOREARM Performed at [facility] [address], [place], [place] [address]    Special Requests   Final    BOTTLES DRAWN AEROBIC AND ANAEROBIC Blood Culture adequate volume Performed at [facility] [address], [place], [place] [address]    Culture  Setup Time   Final    GRAM POSITIVE COCCI AEROBIC BOTTLE ONLY Organism ID to follow CRITICAL RESULT CALLED TO, READ BACK BY AND VERIFIED WITH: PHRMD B GREEN @ 0410 06/22/19 BY  S GEZAHEGN Performed at [facility], [place] [address] [place], [place] [address]    Culture GRAM POSITIVE COCCI  Final   Report Status PENDING  Incomplete  Blood Culture ID Panel (Reflexed)     Status: Abnormal   Collection Time: 06/19/19  8:04 PM  Result Value Ref Range Status   Enterococcus species NOT DETECTED NOT DETECTED Final   Listeria monocytogenes NOT DETECTED NOT DETECTED Final   Staphylococcus species DETECTED (A) NOT DETECTED Final    Comment: Methicillin (  oxacillin) susceptible coagulase negative staphylococcus. Possible blood culture contaminant (unless isolated from more than one blood culture draw or clinical case suggests pathogenicity). No antibiotic treatment is indicated for blood  culture contaminants. CRITICAL RESULT CALLED TO, READ BACK BY AND VERIFIED WITH: PHRMD B GREEN @ 0410 06/22/19 BY  S GEZAHEGN    Staphylococcus aureus (BCID) NOT DETECTED NOT DETECTED Final   Methicillin resistance NOT DETECTED NOT DETECTED Final   Streptococcus species NOT DETECTED NOT DETECTED Final   Streptococcus agalactiae NOT DETECTED NOT DETECTED Final   Streptococcus pneumoniae NOT DETECTED NOT DETECTED Final   Streptococcus pyogenes NOT DETECTED NOT DETECTED Final   Acinetobacter baumannii NOT DETECTED NOT DETECTED Final   Enterobacteriaceae species NOT DETECTED NOT DETECTED Final   Enterobacter cloacae complex NOT DETECTED NOT DETECTED Final    Escherichia coli NOT DETECTED NOT DETECTED Final   Klebsiella oxytoca NOT DETECTED NOT DETECTED Final   Klebsiella pneumoniae NOT DETECTED NOT DETECTED Final   Proteus species NOT DETECTED NOT DETECTED Final   Serratia marcescens NOT DETECTED NOT DETECTED Final   Haemophilus influenzae NOT DETECTED NOT DETECTED Final   Neisseria meningitidis NOT DETECTED NOT DETECTED Final   Pseudomonas aeruginosa NOT DETECTED NOT DETECTED Final   Candida albicans NOT DETECTED NOT DETECTED Final   Candida glabrata NOT DETECTED NOT DETECTED Final   Candida krusei NOT DETECTED NOT DETECTED Final   Candida parapsilosis NOT DETECTED NOT DETECTED Final   Candida tropicalis NOT DETECTED NOT DETECTED Final    Comment: Performed at [facility], [place]. [address] [place], [place] [address]  Blood culture (routine x 2)     Status: None (Preliminary result)   Collection Time: 06/19/19  8:09 PM   Specimen: BLOOD RIGHT HAND  Result Value Ref Range Status   Specimen Description   Final    BLOOD RIGHT HAND Performed at [facility] [address], [place], [place] [address]    Special Requests   Final    BOTTLES DRAWN AEROBIC AND ANAEROBIC Blood Culture results may not be optimal due to an inadequate volume of blood received in culture bottles Performed at [facility] [address], [place], [place] [address]    Culture   Final    NO GROWTH 3 DAYS Performed at [facility], [place] [address], [place], [place] [address]    Report Status PENDING  Incomplete  Urine Culture     Status: Abnormal   Collection Time: 06/19/19 10:14 PM   Specimen: Urine  Result Value Ref Range Status   Specimen Description   Final    Urine Performed at [facility] [address] [place], [place] [address]    Special Requests   Final    NONE Performed at [facility], [place] [address] [place], [place] [address]    Culture >=100,000  COLONIES/mL ESCHERICHIA COLI (A)  Final   Report Status 06/22/2019 FINAL  Final   Organism ID, Bacteria ESCHERICHIA COLI (A)  Final      Susceptibility   Escherichia coli - MIC*    AMPICILLIN <=2 SENSITIVE Sensitive     CEFAZOLIN <=4 SENSITIVE Sensitive     CEFEPIME <=1 SENSITIVE Sensitive     CEFTAZIDIME <=1 SENSITIVE Sensitive     CEFTRIAXONE <=1 SENSITIVE Sensitive     CIPROFLOXACIN <=0.25 SENSITIVE Sensitive     GENTAMICIN <=1 SENSITIVE Sensitive     IMIPENEM <=0.25 SENSITIVE Sensitive     TRIMETH/SULFA <=20  SENSITIVE Sensitive     AMPICILLIN/SULBACTAM <=2 SENSITIVE Sensitive     PIP/TAZO <=4 SENSITIVE Sensitive     Extended ESBL NEGATIVE Sensitive     * >=100,000 COLONIES/mL ESCHERICHIA COLI  SARS Coronavirus 2 [facility] order, Performed in [facility] hospital lab) Nasopharyngeal Nasopharyngeal Swab     Status: None   Collection Time: 06/20/19  2:05 AM   Specimen: Nasopharyngeal Swab  Result Value Ref Range Status   SARS Coronavirus 2 NEGATIVE NEGATIVE Final    Comment: (NOTE) If result is NEGATIVE SARS-CoV-2 target nucleic acids are NOT DETECTED. The SARS-CoV-2 RNA is generally detectable in upper and lower  respiratory specimens during the acute phase of infection. The lowest  concentration of SARS-CoV-2 viral copies this assay can detect is 250  copies / mL. A negative result does not preclude SARS-CoV-2 infection  and should not be used as the sole basis for treatment or other  patient management decisions.  A negative result may occur with  improper specimen collection / handling, submission of specimen other  than nasopharyngeal swab, presence of viral mutation(s) within the  areas targeted by this assay, and inadequate number of viral copies  (<250 copies / mL). A negative result must be combined with clinical  observations, patient history, and epidemiological information. If result is POSITIVE SARS-CoV-2 target nucleic acids are DETECTED. The SARS-CoV-2 RNA is  generally detectable in upper and lower  respiratory specimens dur ing the acute phase of infection.  Positive  results are indicative of active infection with SARS-CoV-2.  Clinical  correlation with patient history and other diagnostic information is  necessary to determine patient infection status.  Positive results do  not rule out bacterial infection or co-infection with other viruses. If result is PRESUMPTIVE POSTIVE SARS-CoV-2 nucleic acids MAY BE PRESENT.   A presumptive positive result was obtained on the submitted specimen  and confirmed on repeat testing.  While 2019 novel coronavirus  (SARS-CoV-2) nucleic acids may be present in the submitted sample  additional confirmatory testing may be necessary for epidemiological  and / or clinical management purposes  to differentiate between  SARS-CoV-2 and other Sarbecovirus currently known to infect humans.  If clinically indicated additional testing with an alternate test  methodology [phone]) is advised. The SARS-CoV-2 RNA is generally  detectable in upper and lower respiratory sp ecimens during the acute  phase of infection. The expected result is Negative. Fact Sheet for Patients:  StrictlyIdeas.no Fact Sheet for Healthcare Providers: BankingDealers.co.za This test is not yet approved or cleared by the [place] FDA and has been authorized for detection and/or diagnosis of SARS-CoV-2 by FDA under an Emergency Use Authorization (EUA).  This EUA will remain in effect (meaning this test can be used) for the duration of the COVID-19 declaration under Section 564(b)(1) of the Act, 21 U.S.C. section 360bbb-3(b)(1), unless the authorization is terminated or revoked sooner. Performed at [facility], [place] [address], [place], [place] [address]          Radiology Studies: No results found.      Scheduled Meds: . azithromycin  250 mg Oral Daily  .  buPROPion  150 mg Oral QHS  . buPROPion  300 mg Oral Daily  . carbidopa-levodopa  2 tablet Oral 3 times per day  . citalopram  40 mg Oral Daily  . enoxaparin (LOVENOX) injection  40 mg Subcutaneous Q24H  . fesoterodine  8 mg Oral Daily  . [name]  2.5 mg Oral QHS  . pneumococcal  23 valent vaccine  0.5 mL Intramuscular Tomorrow-1000   Continuous Infusions: . cefTRIAXone (ROCEPHIN)  IV Stopped (06/21/19 2326)     LOS: 2 days     Cordelia Poche, MD [facility] 06/22/2019, 5:29 PM  If 7PM-7AM, please contact night-coverage [email or web address]

## 2019-06-22 NOTE — Progress Notes (Signed)
  Home health agencies that serve [id number].        Home Health Agencies Search Results  Results List Table  Home Health Agency Information Quality of Patient Care Rating Patient Survey Summary Rating  [facility] [phone] 3 out of 5 stars 5 out of 5 stars  [facility] [phone] 3 out of 5 stars 4 out of 5 stars  [facility] [phone] 4 out of 5 stars 4 out of 5 stars  [facility] [phone] 4  out of 5 stars 3 out of 5 stars  [facility] [phone] 4 out of 5 stars 4 out of 5 stars  [facility] [phone] 4 out of 5 stars 4 out of 5 stars  [facility] [phone] 4  out of 5 stars 4 out of 5 stars  [facility] [phone] 4 out of 5 stars 4 out of 5 stars  [facility] [phone] 3  out of 5 stars 4 out of 5 stars  [facility] [phone] 3 out of 5 stars 4 out of 5 stars  [facility] [phone] 4 out of 5 stars Not Available12  [facility] [phone] 3  out of 5 stars 3 out of 5 stars  [facility] [phone] 3  out of 5 stars 4 out of 5 stars  [facility] [phone] 3  out of 5 stars 4 out of 5 stars  [facility] [phone] 3  out of 5 stars 3 out of 5 stars  [facility] [phone] 3  out of 5 stars Not Available11  [facility] [phone] 3  out of 5 stars 4 out of 5 stars  [facility] [phone] 4  out of 5 stars 3 out of 5 stars  [facility] [phone] 4  out of 5 stars 2 out of 5 stars   Home Health Footnotes  Footnote number Footnote as displayed on Home Health Compare  1 This agency provides services under a federal waiver program to non-traditional, chronic long term population.  2 This agency provides services to a  special needs population.  3 Not Available.  4 The number of patient episodes for this measure is too small to report.  5 This measure currently does not have data or provider has been certified/recertified for less than 6 months.  6 The national average for this measure is not provided because of state-to-state differences in data collection.  7 Medicare is not displaying rates for this measure for any home health agency, because of an issue with the data.  8 There were problems with the data and they are being corrected.  9 Zero, or very few, patients met the survey's rules for inclusion. The scores shown, if any, reflect a very small number of surveys and may not accurately tell how an agency is doing.  10 Survey results are based on less than 12 months of data.  11 Fewer than 70 patients completed the survey. Use the scores shown, if any, with caution as the number of surveys may be too low to accurately tell how an agency is doing.  12 No survey results are available for this period.  13 Data suppressed by CMS   for one or more quarters.

## 2019-06-22 NOTE — TOC Progression Note (Signed)
Transition of Care [facility]) - Progression Note    Patient Details  Name: [name] [name] MRN: [id number] Date of Birth: 11-19-1950  Transition of Care [facility]) CM/SW [place], [place] Phone Number: [phone] 06/22/2019, 4:09 PM  Clinical Narrative:   Wife now agreeable to SNF recommendation (had previously declined and set up for return home). Completed FL2 and referrals. Wife will need bed offers.  Pasrr in manual review, will need follow up once additional information requested ([facility] does not operate on weekends).    Expected Discharge Plan: SNF Barriers to Discharge: SNF pending bed offer, pasrr Expected Discharge Plan and Services Expected Discharge Plan: SNF   Discharge Planning Services: CM Consult Post Acute Care Choice: Resumption of Svcs/PTA Provider Living arrangements for the past 2 months: Single Family Home Expected Discharge Date: 06/22/19               DME Arranged: N/A DME Agency: NA       HH Arranged: OT, PT [place] Agency: [facility] [employer] Date [place]: 06/22/19 Time [place]: [place] spoke with at [place]: [place] ([place]) Interventions    Readmission Risk Interventions No flowsheet data found.

## 2019-06-22 NOTE — NC FL2 (Signed)
[place] MEDICAID FL2 LEVEL OF CARE SCREENING TOOL     IDENTIFICATION  Patient Name: [name] [name] Birthdate: 05/26/1951 Sex: male Admission Date (Current Location): 06/19/2019  [facility] and [place] Number:  Herbalist and Address:  [facility],  [place], [place]      Provider Number: [id number]  Attending Physician Name and Address:  Mariel Aloe, MD  Relative Name and Phone Number:  wife [name] [phone]    Current Level of Care: Hospital Recommended Level of Care: [place] Prior Approval Number:    Date Approved/Denied:   PASRR Number: pending  Discharge Plan: SNF    Current Diagnoses: Patient Active Problem List   Diagnosis Date Noted  . Fever 06/20/2019  . Acute lower UTI 06/20/2019  . Pressure injury of skin 06/20/2019  . Sepsis ([place]) 06/20/2019  . Chronic idiopathic constipation 03/18/2019  . Hallucination 02/22/2019  . Overactive bladder 02/04/2019  . Postoperative pain   . Labile blood pressure   . Dysphagia   . Episode of recurrent major depressive disorder ([place])   . Hypoalbuminemia due to protein-calorie malnutrition ([place])   . Left displaced femoral neck fracture ([place]) 12/11/2018  . Closed fracture of neck of left femur ([place])   . Closed left hip fracture ([place]) 12/08/2018  . TMJ (temporomandibular joint syndrome) 10/10/2017  . Parkinson's disease (tremor, stiffness, slow motion, unstable posture) ([place]) 03/20/2017  . Bilateral lower extremity edema 03/20/2017  . Chronic venous insufficiency 09/13/2016  . Fatigue 03/15/2016  . Parkinsonian features 09/15/2015  . ED (erectile dysfunction) 09/15/2015  . MDD (major depressive disorder), recurrent episode, severe ([place]) 02/10/2014  . Nonspecific abnormal electrocardiogram (ECG) (EKG) 02/07/2014  . Non-compliant behavior 02/03/2014  . Community acquired pneumonia of left lower lobe of lung ([place]) 10/02/2013  . Morton's neuroma of left foot  08/07/2013  . 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 Hyperglycemia 01/26/2012  . Hypogonadism male 01/24/2012    Orientation RESPIRATION BLADDER Height & Weight     Self, Time, Situation, Place  Normal Incontinent Weight: 275 lb 9.2 oz (125 kg) Height:  6\' 2"  (188 cm)  BEHAVIORAL SYMPTOMS/MOOD NEUROLOGICAL BOWEL NUTRITION STATUS      Incontinent Diet(heart healthy)  AMBULATORY STATUS COMMUNICATION OF NEEDS Skin   Extensive Assist Verbally PU Stage and Appropriate Care(sacrum, foam dressing)                       Personal Care Assistance Level of Assistance  Bathing, Feeding, Dressing Bathing Assistance: Maximum assistance Feeding assistance: Maximum assistance Dressing Assistance: Maximum assistance     Functional Limitations Info  Sight, Hearing, Speech Sight Info: Adequate Hearing Info: Adequate Speech Info: Adequate    SPECIAL CARE FACTORS FREQUENCY  PT (By licensed PT), OT (By licensed OT)     PT Frequency: 5x OT Frequency: 5x            Contractures Contractures Info: Not present    Additional Factors Info  Code Status, Allergies Code Status Info: full code Allergies Info: nka           Current Medications (06/22/2019):  This is the current hospital active medication list Current Facility-Administered Medications  Medication Dose Route Frequency Provider Last Rate Last Dose  . acetaminophen (TYLENOL) tablet 650 mg  650 mg Oral Q6H PRN Etta Quill, DO       Or  . acetaminophen (TYLENOL) suppository 650 mg  650 mg Rectal Q6H  PRN Etta Quill, DO      . azithromycin [facility]) tablet 250 mg  250 mg Oral Daily Jennette Kettle M, DO   250 mg at 06/22/19 1118  . buPROPion (WELLBUTRIN XL) 24 hr tablet 150 mg  150 mg Oral QHS [place] Eric J, DO   150 mg at 06/21/19 2254  . buPROPion (WELLBUTRIN XL) 24 hr tablet 300 mg  300 mg Oral Daily [place] Donnamarie Poag, DO   300 mg at 06/22/19 [id number]  . carbidopa-levodopa  (SINEMET IR) 25-100 MG per tablet immediate release 2 tablet  2 tablet Oral 3 times per day Mariel Aloe, MD   2 tablet at 06/22/19 1604  . cefTRIAXone (ROCEPHIN) 2 g in sodium chloride 0.9 % 100 mL IVPB  2 g Intravenous Q24H Etta Quill, DO   Stopped at 06/21/19 2326  . citalopram (CELEXA) tablet 40 mg  40 mg Oral Daily [place] Eric J, DO   40 mg at 06/21/19 2253  . enoxaparin (LOVENOX) injection 40 mg  40 mg Subcutaneous Q24H Jennette Kettle M, DO   40 mg at 06/22/19 0020  . fesoterodine (TOVIAZ) tablet 8 mg  8 mg Oral Daily [place] Donnamarie Poag, DO   8 mg at 06/21/19 1805  . [name] (ZYPREXA) tablet 2.5 mg  2.5 mg Oral QHS Jennette Kettle M, DO   2.5 mg at 06/21/19 2253  . ondansetron (ZOFRAN) tablet 4 mg  4 mg Oral Q6H PRN Etta Quill, DO       Or  . ondansetron [facility]) injection 4 mg  4 mg Intravenous Q6H PRN Etta Quill, DO      . pneumococcal 23 valent vaccine (PNU-IMMUNE) injection 0.5 mL  0.5 mL Intramuscular Tomorrow-1000 Etta Quill, DO         Discharge Medications: Please see discharge summary for a list of discharge medications.  Relevant Imaging Results:  Relevant Lab Results:   Additional Information SS# [id number]  Nila Nephew, LCSW

## 2019-06-22 NOTE — Progress Notes (Signed)
Physical Therapy Treatment Patient Details Name: [name] [name] MRN: [id number] DOB: Jul 22, 1951 Today's Date: 06/22/2019    History of Present Illness 68 year old man admitted with fever and weakness.  Found to have uti and possible pna.  PMH: Parkinson's Disease,     PT Comments    DISCUSSION HAD WITH PATIENT AND WIFE regards to DC plan for home today via nonemergent EMS. Wife requesting pt. Be taken to 2nd level where bed/bath are located. This PT recommended to wife that a  hospital be on first floor would be advisable as pt. currently is unable to get to 2nd level.  Only has a lift chair on first level . Wife requesting hospital bed on 2nd floor. Deferred to case manager for the specifics of DME.Marland Kitchen Also recommended to wife a mechanical lift as pt. Currently unable to stand. Wife became emotional and stated her concern for getting him to a BSC. This Probation officer informed wife that the lift is usable for getting pt. Out of recliner.. Wife then stated that  The patient will need to go to SNF/rehab prior to DC. RN notified of wife's request. Continue PT while in acute care.  Follow Up Recommendations  SNF;Supervision/Assistance - 24 hour     Equipment Recommendations  None recommended by PT(if Dc home, Hospital bed, hoyer lift)    Recommendations for Other Services       Precautions / Restrictions Precautions Precautions: Fall    Mobility  Bed Mobility               General bed mobility comments: NT, nursing just made attempt to stand w/ RW  Transfers                    Ambulation/Gait                 Stairs             Wheelchair Mobility    Modified Rankin (Stroke Patients Only)       Balance                                            Cognition Arousal/Alertness: Awake/alert Behavior During Therapy: Flat affect Overall Cognitive Status: Impaired/Different from baseline Area of Impairment: Attention;Orientation                  Orientation Level: Disoriented to;Situation;Place             General Comments: stated : I'm the patient" while speaking with wife and pt. re: DC plans      Exercises      General Comments        Pertinent Vitals/Pain      Home Living                      Prior Function            PT Goals (current goals can now be found in the care plan section) Progress towards PT goals: Progressing toward goals    Frequency    Min 2X/week      PT Plan Current plan remains appropriate;Frequency needs to be updated    Co-evaluation              AM-PAC PT "6 Clicks" Mobility   Outcome Measure  Help needed turning from your back to your  side while in a flat bed without using bedrails?: Total Help needed moving from lying on your back to sitting on the side of a flat bed without using bedrails?: Total Help needed moving to and from a bed to a chair (including a wheelchair)?: Total Help needed standing up from a chair using your arms (e.g., wheelchair or bedside chair)?: Total Help needed to walk in hospital room?: Total Help needed climbing 3-5 steps with a railing? : Total 6 Click Score: 6    End of Session   Activity Tolerance: Patient limited by lethargy Patient left: in bed;with call bell/phone within reach;with family/visitor present Nurse Communication: Mobility status(wife now wants SNF) PT Visit Diagnosis: Unsteadiness on feet (R26.81);Difficulty in walking, not elsewhere classified (R26.2);Muscle weakness (generalized) (M62.81);Other abnormalities of gait and mobility (R26.89)     Time: [id number] PT Time Calculation (min) (ACUTE ONLY): 18 min  Charges:  $Self Care/Home Management: Benedict Pager [phone] Office [phone]    Claretha Cooper 06/22/2019, 3:11 PM

## 2019-06-22 NOTE — Discharge Summary (Signed)
Physician Discharge Summary  ADE SCHETTLER [id number] DOB: 1951-09-29 DOA: 06/19/2019  PCP: [name], MD  Admit date: 06/19/2019 Discharge date: 06/24/2019  Admitted From: Home Disposition: SNF  Recommendations for Outpatient Follow-up:  1. Follow up with PCP in 1 week 2. Please obtain BMP/CBC in one week 3. Please follow up on the following pending results: None  Home Health: SNF Equipment/Devices: SNF  Discharge Condition: Stable CODE STATUS: Full code Diet recommendation: Regular   Brief/Interim Summary:  Admission HPI written by Etta Quill, DO   Chief Complaint: Fall, fever  HPI: LYRIX WINGERD is a 68 y.o. male with medical history significant of Parkinson's disease.  Patient had fever of 102 last evening.  Wife called PCP, got sent over to [facility] drive through for COVID testing.  No known exposures to COVID though as he doesn't go anywhere other than doctors appointments.  Today trying to get up to walk to bathroom, became very weak and unable to stand.  EMS called.  No cough, no vomiting, no diarrhea, no reported rashes.  Has some neck pain, but no stiffness, had headache earlier but none now he says.  No injuries from sliding out of chair.   ED Course: UA is very positive for UTI with large LE, positive Nitrites > 50 WBCs, cloudy, etc. WBC 10k. Does have mild LLL airspace disease, actelectasis vs PNA.    Hospital course:  Sepsis Secondary to urinary tract infection. Physiology resolved. Present on admission. Blood culture significant for staph species in 1/4 bottles; likely contaminant. No treatment.  E coli UTI Pansensitive. Empirically treated with Ceftriaxone and will discharge on Cefdinir to complete 7 days.  Possible pneumonia Empiric treatment with Ceftriaxone and azithromycin. Completed azithromycin. Cefdinir on discharge for two days to complete 7 day course.  Depression Continue Wellbutrin  Parkinson  disease Continue Sinemet  Constipation Dulcolax suppository prn. Miralaxprn  Urinary incontinence Continue Toviaz  Obesity Body mass index is 35.38 kg/m.  Pressure injury Medial sacrum. POA   Discharge Diagnoses:  Principal Problem:   Acute lower UTI Active Problems:   Community acquired pneumonia of left lower lobe of lung ([place])   Parkinson's disease (tremor, stiffness, slow motion, unstable posture) ([facility])   Fever   Pressure injury of skin   Sepsis ([place])    Discharge Instructions  Discharge Instructions    Call MD for:  temperature >100.4   Complete by: As directed      Allergies as of 06/24/2019   No Known Allergies     Medication List    TAKE these medications   B-complex with vitamin C tablet Take 1 tablet by mouth daily.   bisacodyl 10 MG suppository Commonly known as: DULCOLAX Place 1 suppository (10 mg total) rectally daily as needed for moderate constipation.   buPROPion 300 MG 24 hr tablet Commonly known as: WELLBUTRIN XL Take 1 tablet (300 mg total) by mouth every morning.   buPROPion 150 MG 24 hr tablet Commonly known as: Wellbutrin XL Take 1 tablet (150 mg total) by mouth daily.   carbidopa-levodopa 25-100 MG tablet Commonly known as: SINEMET IR Take 1-2 tablets by mouth See admin instructions. Take 2 tabs at 7am, Take 2 tabs at 11 am, and Take 1 tabs at 4 pm. What changed: additional instructions   cefdinir 300 MG capsule Commonly known as: OMNICEF Take 1 capsule (300 mg total) by mouth 2 (two) times daily for 2 days.   citalopram 40 MG tablet Commonly known as:  CELEXA Take 0.5 tablets (20 mg total) by mouth daily. Dose adjusted for safety. Please discuss with prescribing physician. What changed:   how much to take  additional instructions   [name] 2.5 MG tablet Commonly known as: ZYPREXA Take 1 tablet (2.5 mg total) by mouth at bedtime.   OMEGA 3-6-9 PO Take 1 capsule by mouth daily.   polyethylene glycol 17 g  packet Commonly known as: MIRALAX / GLYCOLAX Take 17 g by mouth daily.   Toviaz 8 MG Tb24 tablet Generic drug: fesoterodine Take 8 mg by mouth daily.   TURMERIC CURCUMIN PO Take 1 capsule by mouth daily.      Follow-up Information    [facility] Follow up.   Specialty: Home Health Services Why: agency will provide home health physical and occupational therapy, agency will call you to schedule first visit. Contact information: [address] [place] [address] [phone]        [name], MD. Schedule an appointment as soon as possible for a visit in 1 week(s).   Specialty: Internal Medicine Contact information: [place] [address] [phone]          No Known Allergies  Consultations:  None   Procedures/Studies: Dg Chest Portable 1 View  Result Date: 06/19/2019 CLINICAL DATA:  Fever EXAM: PORTABLE CHEST 1 VIEW COMPARISON:  12/14/2018 FINDINGS: Low lung volumes. Mild cardiomegaly. Mild airspace disease at the left base. No pneumothorax. IMPRESSION: 1. Mild airspace disease at the peripheral left base, atelectasis versus mild pneumonia 2. Low lung volumes.  Mild cardiomegaly Electronically Signed   By: Donavan Foil M.D.   On: 06/19/2019 20:28     Subjective: No issues today. Had a large bowel movement.  Discharge Exam: Vitals:   06/24/19 0631 06/24/19 1310  BP: 115/71 106/73  Pulse: 65 80  Resp: 16 20  Temp: 98.2 F (36.8 C) 98 F (36.7 C)  SpO2: 91% 90%   Vitals:   06/23/19 1325 06/23/19 2249 06/24/19 0631 06/24/19 1310  BP: 110/71 120/82 115/71 106/73  Pulse: 81 75 65 80  Resp: 18 16 16 20   Temp: 98.1 F (36.7 C) 98.3 F (36.8 C) 98.2 F (36.8 C) 98 F (36.7 C)  TempSrc: Oral Oral Oral Oral  SpO2: 95% 93% 91% 90%  Weight:      Height:        General: Pt is alert, awake, not in acute distress Cardiovascular: RRR, S1/S2 +, no rubs, no gallops Respiratory: CTA bilaterally, no wheezing,  no rhonchi Abdominal: Soft, NT, ND, bowel sounds + Extremities: no edema, no cyanosis    The results of significant diagnostics from this hospitalization (including imaging, microbiology, ancillary and laboratory) are listed below for reference.     Microbiology: Recent Results (from the past 240 hour(s))  Novel Coronavirus, NAA ([facility])     Status: None   Collection Time: 06/19/19 12:00 AM   Specimen: Oropharyngeal(OP) collection in vial transport medium   OROPHARYNGEA  TESTING  Result Value Ref Range Status   SARS-CoV-2, NAA Not Detected Not Detected Final    Comment: This nucleic acid amplification test was developed and its performance characteristics determined by [employer]. Nucleic acid amplification tests include PCR and TMA. This test has not been FDA cleared or approved. This test has been authorized by FDA under an Emergency Use Authorization (EUA). This test is only authorized for the duration of time the declaration that circumstances exist justifying the authorization of the  emergency use of in vitro diagnostic tests for detection of SARS-CoV-2 virus and/or diagnosis of COVID-19 infection under section 564(b)(1) of the Act, 21 U.S.C. [id number]) (1), unless the authorization is terminated or revoked sooner. When diagnostic testing is negative, the possibility of a false negative result should be considered in the context of a patient's recent exposures and the presence of clinical signs and symptoms consistent with COVID-19. An individual without symptoms of COVID-19 and who is not shedding SARS-CoV-2 virus would  expect to have a negative (not detected) result in this assay.   Blood culture (routine x 2)     Status: Abnormal   Collection Time: 06/19/19  8:04 PM   Specimen: BLOOD  Result Value Ref Range Status   Specimen Description   Final    BLOOD BLOOD LEFT FOREARM Performed at [facility] [address], [place], [place]  [address]    Special Requests   Final    BOTTLES DRAWN AEROBIC AND ANAEROBIC Blood Culture adequate volume Performed at [facility] [address] [place], [place] [address]    Culture  Setup Time   Final    GRAM POSITIVE COCCI AEROBIC BOTTLE ONLY CRITICAL RESULT CALLED TO, READ BACK BY AND VERIFIED WITH: PHRMD B GREEN @ 0410 06/22/19 BY  S GEZAHEGN    Culture (A)  Final    STAPHYLOCOCCUS SPECIES (COAGULASE NEGATIVE) THE SIGNIFICANCE OF ISOLATING THIS ORGANISM FROM A SINGLE SET OF BLOOD CULTURES WHEN MULTIPLE SETS ARE DRAWN IS UNCERTAIN. PLEASE NOTIFY THE MICROBIOLOGY DEPARTMENT WITHIN ONE WEEK IF SPECIATION AND SENSITIVITIES ARE REQUIRED. Performed at [facility], [place] [address] [place], [place] [address]    Report Status 06/23/2019 FINAL  Final  Blood Culture ID Panel (Reflexed)     Status: Abnormal   Collection Time: 06/19/19  8:04 PM  Result Value Ref Range Status   Enterococcus species NOT DETECTED NOT DETECTED Final   Listeria monocytogenes NOT DETECTED NOT DETECTED Final   Staphylococcus species DETECTED (A) NOT DETECTED Final    Comment: Methicillin (oxacillin) susceptible coagulase negative staphylococcus. Possible blood culture contaminant (unless isolated from more than one blood culture draw or clinical case suggests pathogenicity). No antibiotic treatment is indicated for blood  culture contaminants. CRITICAL RESULT CALLED TO, READ BACK BY AND VERIFIED WITH: PHRMD B GREEN @ 0410 06/22/19 BY  S GEZAHEGN    Staphylococcus aureus (BCID) NOT DETECTED NOT DETECTED Final   Methicillin resistance NOT DETECTED NOT DETECTED Final   Streptococcus species NOT DETECTED NOT DETECTED Final   Streptococcus agalactiae NOT DETECTED NOT DETECTED Final   Streptococcus pneumoniae NOT DETECTED NOT DETECTED Final   Streptococcus pyogenes NOT DETECTED NOT DETECTED Final   Acinetobacter baumannii NOT DETECTED NOT DETECTED Final   Enterobacteriaceae species NOT DETECTED  NOT DETECTED Final   Enterobacter cloacae complex NOT DETECTED NOT DETECTED Final   Escherichia coli NOT DETECTED NOT DETECTED Final   Klebsiella oxytoca NOT DETECTED NOT DETECTED Final   Klebsiella pneumoniae NOT DETECTED NOT DETECTED Final   Proteus species NOT DETECTED NOT DETECTED Final   Serratia marcescens NOT DETECTED NOT DETECTED Final   Haemophilus influenzae NOT DETECTED NOT DETECTED Final   Neisseria meningitidis NOT DETECTED NOT DETECTED Final   Pseudomonas aeruginosa NOT DETECTED NOT DETECTED Final   Candida albicans NOT DETECTED NOT DETECTED Final   Candida glabrata NOT DETECTED NOT DETECTED Final   Candida krusei NOT DETECTED NOT DETECTED Final   Candida parapsilosis NOT DETECTED NOT DETECTED Final  Candida tropicalis NOT DETECTED NOT DETECTED Final    Comment: Performed at [facility], [place] [address] [place], [place] [address]  Blood culture (routine x 2)     Status: None   Collection Time: 06/19/19  8:09 PM   Specimen: BLOOD RIGHT HAND  Result Value Ref Range Status   Specimen Description   Final    BLOOD RIGHT HAND Performed at [facility] [address], [place], [place] [address]    Special Requests   Final    BOTTLES DRAWN AEROBIC AND ANAEROBIC Blood Culture results may not be optimal due to an inadequate volume of blood received in culture bottles Performed at [facility] [address] [place], [place] [address]    Culture   Final    NO GROWTH 5 DAYS Performed at [facility], [place] [address], [place], [place] [address]    Report Status 06/24/2019 FINAL  Final  Urine Culture     Status: Abnormal   Collection Time: 06/19/19 10:14 PM   Specimen: Urine  Result Value Ref Range Status   Specimen Description   Final    Urine Performed at [facility] [address], [place],  [address]    Special Requests   Final    NONE Performed at [facility], [place]  [address] [place], [place] [address]    Culture >=100,000 COLONIES/mL ESCHERICHIA COLI (A)  Final   Report Status 06/22/2019 FINAL  Final   Organism ID, Bacteria ESCHERICHIA COLI (A)  Final      Susceptibility   Escherichia coli - MIC*    AMPICILLIN <=2 SENSITIVE Sensitive     CEFAZOLIN <=4 SENSITIVE Sensitive     CEFEPIME <=1 SENSITIVE Sensitive     CEFTAZIDIME <=1 SENSITIVE Sensitive     CEFTRIAXONE <=1 SENSITIVE Sensitive     CIPROFLOXACIN <=0.25 SENSITIVE Sensitive     GENTAMICIN <=1 SENSITIVE Sensitive     IMIPENEM <=0.25 SENSITIVE Sensitive     TRIMETH/SULFA <=20 SENSITIVE Sensitive     AMPICILLIN/SULBACTAM <=2 SENSITIVE Sensitive     PIP/TAZO <=4 SENSITIVE Sensitive     Extended ESBL NEGATIVE Sensitive     * >=100,000 COLONIES/mL ESCHERICHIA COLI  SARS Coronavirus 2 [facility] order, Performed in [facility] hospital lab) Nasopharyngeal Nasopharyngeal Swab     Status: None   Collection Time: 06/20/19  2:05 AM   Specimen: Nasopharyngeal Swab  Result Value Ref Range Status   SARS Coronavirus 2 NEGATIVE NEGATIVE Final    Comment: (NOTE) If result is NEGATIVE SARS-CoV-2 target nucleic acids are NOT DETECTED. The SARS-CoV-2 RNA is generally detectable in upper and lower  respiratory specimens during the acute phase of infection. The lowest  concentration of SARS-CoV-2 viral copies this assay can detect is 250  copies / mL. A negative result does not preclude SARS-CoV-2 infection  and should not be used as the sole basis for treatment or other  patient management decisions.  A negative result may occur with  improper specimen collection / handling, submission of specimen other  than nasopharyngeal swab, presence of viral mutation(s) within the  areas targeted by this assay, and inadequate number of viral copies  (<250 copies / mL). A negative result must be combined with clinical  observations, patient history, and epidemiological information. If result is POSITIVE SARS-CoV-2  target nucleic acids are DETECTED. The SARS-CoV-2 RNA is generally detectable in upper and lower  respiratory specimens dur ing the acute  phase of infection.  Positive  results are indicative of active infection with SARS-CoV-2.  Clinical  correlation with patient history and other diagnostic information is  necessary to determine patient infection status.  Positive results do  not rule out bacterial infection or co-infection with other viruses. If result is PRESUMPTIVE POSTIVE SARS-CoV-2 nucleic acids MAY BE PRESENT.   A presumptive positive result was obtained on the submitted specimen  and confirmed on repeat testing.  While 2019 novel coronavirus  (SARS-CoV-2) nucleic acids may be present in the submitted sample  additional confirmatory testing may be necessary for epidemiological  and / or clinical management purposes  to differentiate between  SARS-CoV-2 and other Sarbecovirus currently known to infect humans.  If clinically indicated additional testing with an alternate test  methodology [phone]) is advised. The SARS-CoV-2 RNA is generally  detectable in upper and lower respiratory sp ecimens during the acute  phase of infection. The expected result is Negative. Fact Sheet for Patients:  StrictlyIdeas.no Fact Sheet for Healthcare Providers: BankingDealers.co.za This test is not yet approved or cleared by the [place] FDA and has been authorized for detection and/or diagnosis of SARS-CoV-2 by FDA under an Emergency Use Authorization (EUA).  This EUA will remain in effect (meaning this test can be used) for the duration of the COVID-19 declaration under Section 564(b)(1) of the Act, 21 U.S.C. section 360bbb-3(b)(1), unless the authorization is terminated or revoked sooner. Performed at [facility], [place] [address] [place], [place] [address]      Labs: BNP (last 3 results) No results for input(s): BNP  in the last 8760 hours. Basic Metabolic Panel: Recent Labs  Lab 06/19/19 2004 06/20/19 0609 06/21/19 0454 06/22/19 0433  NA 132* 135 139 141  K 3.9 3.4* 3.5 3.8  CL 99 105 109 107  CO2 22 19* 23 24  GLUCOSE 125* 105* 105* 102*  BUN 17 13 13 13   CREATININE 1.20 1.07 0.99 1.00  CALCIUM 8.4* 8.4* 8.6* 8.6*  MG  --   --  2.1 2.2   Liver Function Tests: Recent Labs  Lab 06/19/19 2004  AST 52*  ALT 10  ALKPHOS 83  BILITOT 0.8  PROT 6.7  ALBUMIN 3.1*   No results for input(s): LIPASE, AMYLASE in the last 168 hours. No results for input(s): AMMONIA in the last 168 hours. CBC: Recent Labs  Lab 06/19/19 2004 06/20/19 0609 06/21/19 0454 06/22/19 0433  WBC 10.3 8.1 6.5 5.3  NEUTROABS 9.2*  --   --   --   HGB 10.7* 10.5* 10.3* 10.1*  HCT 32.3* 32.5* 31.7* 31.3*  MCV 92.8 93.9 93.2 93.7  PLT 253 237 245 281   Cardiac Enzymes: No results for input(s): CKTOTAL, CKMB, CKMBINDEX, TROPONINI in the last 168 hours. BNP: Invalid input(s): POCBNP CBG: No results for input(s): GLUCAP in the last 168 hours. D-Dimer No results for input(s): DDIMER in the last 72 hours. Hgb A1c No results for input(s): HGBA1C in the last 72 hours. Lipid Profile No results for input(s): CHOL, HDL, LDLCALC, TRIG, CHOLHDL, LDLDIRECT in the last 72 hours. Thyroid function studies No results for input(s): TSH, T4TOTAL, T3FREE, THYROIDAB in the last 72 hours.  Invalid input(s): FREET3 Anemia work up No results for input(s): VITAMINB12, FOLATE, FERRITIN, TIBC, IRON, RETICCTPCT in the last 72 hours. Urinalysis    Component Value Date/Time   COLORURINE AMBER (A) 06/19/2019 2004   APPEARANCEUR CLOUDY (A) 06/19/2019 2004   LABSPEC 1.014 06/19/2019 2004   LABSPEC 1.027  03/29/2019   PHURINE 5.0 06/19/2019 2004   GLUCOSEU NEGATIVE 06/19/2019 2004   GLUCOSEU Neg 02/08/2019   HGBUR MODERATE (A) 06/19/2019 2004   BILIRUBINUR NEGATIVE 06/19/2019 2004   KETONESUR 5 (A) 06/19/2019 2004   PROTEINUR 30 (A)  06/19/2019 2004   UROBILINOGEN Normal 03/29/2019   NITRITE POSITIVE (A) 06/19/2019 2004   LEUKOCYTESUR LARGE (A) 06/19/2019 2004   Sepsis Labs Invalid input(s): PROCALCITONIN,  WBC,  LACTICIDVEN Microbiology Recent Results (from the past 240 hour(s))  Novel Coronavirus, NAA ([facility])     Status: None   Collection Time: 06/19/19 12:00 AM   Specimen: Oropharyngeal(OP) collection in vial transport medium   OROPHARYNGEA  TESTING  Result Value Ref Range Status   SARS-CoV-2, NAA Not Detected Not Detected Final    Comment: This nucleic acid amplification test was developed and its performance characteristics determined by [employer]. Nucleic acid amplification tests include PCR and TMA. This test has not been FDA cleared or approved. This test has been authorized by FDA under an Emergency Use Authorization (EUA). This test is only authorized for the duration of time the declaration that circumstances exist justifying the authorization of the emergency use of in vitro diagnostic tests for detection of SARS-CoV-2 virus and/or diagnosis of COVID-19 infection under section 564(b)(1) of the Act, 21 U.S.C. [id number]) (1), unless the authorization is terminated or revoked sooner. When diagnostic testing is negative, the possibility of a false negative result should be considered in the context of a patient's recent exposures and the presence of clinical signs and symptoms consistent with COVID-19. An individual without symptoms of COVID-19 and who is not shedding SARS-CoV-2 virus would  expect to have a negative (not detected) result in this assay.   Blood culture (routine x 2)     Status: Abnormal   Collection Time: 06/19/19  8:04 PM   Specimen: BLOOD  Result Value Ref Range Status   Specimen Description   Final    BLOOD BLOOD LEFT FOREARM Performed at [facility] [address] [place], [place] [address]    Special Requests   Final    BOTTLES DRAWN  AEROBIC AND ANAEROBIC Blood Culture adequate volume Performed at [facility] [address], [place], [place] [address]    Culture  Setup Time   Final    GRAM POSITIVE COCCI AEROBIC BOTTLE ONLY CRITICAL RESULT CALLED TO, READ BACK BY AND VERIFIED WITH: PHRMD B GREEN @ 0410 06/22/19 BY  S GEZAHEGN    Culture (A)  Final    STAPHYLOCOCCUS SPECIES (COAGULASE NEGATIVE) THE SIGNIFICANCE OF ISOLATING THIS ORGANISM FROM A SINGLE SET OF BLOOD CULTURES WHEN MULTIPLE SETS ARE DRAWN IS UNCERTAIN. PLEASE NOTIFY THE MICROBIOLOGY DEPARTMENT WITHIN ONE WEEK IF SPECIATION AND SENSITIVITIES ARE REQUIRED. Performed at [facility], [place] [address] [place],  [address]    Report Status 06/23/2019 FINAL  Final  Blood Culture ID Panel (Reflexed)     Status: Abnormal   Collection Time: 06/19/19  8:04 PM  Result Value Ref Range Status   Enterococcus species NOT DETECTED NOT DETECTED Final   Listeria monocytogenes NOT DETECTED NOT DETECTED Final   Staphylococcus species DETECTED (A) NOT DETECTED Final    Comment: Methicillin (oxacillin) susceptible coagulase negative staphylococcus. Possible blood culture contaminant (unless isolated from more than one blood culture draw or clinical case suggests pathogenicity). No antibiotic treatment is indicated for blood  culture contaminants. CRITICAL RESULT CALLED TO, READ BACK BY AND VERIFIED WITH: PHRMD B GREEN @  0410 06/22/19 BY  S GEZAHEGN    Staphylococcus aureus (BCID) NOT DETECTED NOT DETECTED Final   Methicillin resistance NOT DETECTED NOT DETECTED Final   Streptococcus species NOT DETECTED NOT DETECTED Final   Streptococcus agalactiae NOT DETECTED NOT DETECTED Final   Streptococcus pneumoniae NOT DETECTED NOT DETECTED Final   Streptococcus pyogenes NOT DETECTED NOT DETECTED Final   Acinetobacter baumannii NOT DETECTED NOT DETECTED Final   Enterobacteriaceae species NOT DETECTED NOT DETECTED Final   Enterobacter cloacae complex  NOT DETECTED NOT DETECTED Final   Escherichia coli NOT DETECTED NOT DETECTED Final   Klebsiella oxytoca NOT DETECTED NOT DETECTED Final   Klebsiella pneumoniae NOT DETECTED NOT DETECTED Final   Proteus species NOT DETECTED NOT DETECTED Final   Serratia marcescens NOT DETECTED NOT DETECTED Final   Haemophilus influenzae NOT DETECTED NOT DETECTED Final   Neisseria meningitidis NOT DETECTED NOT DETECTED Final   Pseudomonas aeruginosa NOT DETECTED NOT DETECTED Final   Candida albicans NOT DETECTED NOT DETECTED Final   Candida glabrata NOT DETECTED NOT DETECTED Final   Candida krusei NOT DETECTED NOT DETECTED Final   Candida parapsilosis NOT DETECTED NOT DETECTED Final   Candida tropicalis NOT DETECTED NOT DETECTED Final    Comment: Performed at [facility], [place] [address] [place], [place] [address]  Blood culture (routine x 2)     Status: None   Collection Time: 06/19/19  8:09 PM   Specimen: BLOOD RIGHT HAND  Result Value Ref Range Status   Specimen Description   Final    BLOOD RIGHT HAND Performed at [facility] [address] [place], [place] [address]    Special Requests   Final    BOTTLES DRAWN AEROBIC AND ANAEROBIC Blood Culture results may not be optimal due to an inadequate volume of blood received in culture bottles Performed at [facility] [address], [place], [place] [address]    Culture   Final    NO GROWTH 5 DAYS Performed at [facility], [place] [address] [place], [place] [address]    Report Status 06/24/2019 FINAL  Final  Urine Culture     Status: Abnormal   Collection Time: 06/19/19 10:14 PM   Specimen: Urine  Result Value Ref Range Status   Specimen Description   Final    Urine Performed at [facility] [address], [place], [place] [address]    Special Requests   Final    NONE Performed at [facility], [place] [address], [place], [place] [address]    Culture  >=100,000 COLONIES/mL ESCHERICHIA COLI (A)  Final   Report Status 06/22/2019 FINAL  Final   Organism ID, Bacteria ESCHERICHIA COLI (A)  Final      Susceptibility   Escherichia coli - MIC*    AMPICILLIN <=2 SENSITIVE Sensitive     CEFAZOLIN <=4 SENSITIVE Sensitive     CEFEPIME <=1 SENSITIVE Sensitive     CEFTAZIDIME <=1 SENSITIVE Sensitive     CEFTRIAXONE <=1 SENSITIVE Sensitive     CIPROFLOXACIN <=0.25 SENSITIVE Sensitive     GENTAMICIN <=1 SENSITIVE Sensitive     IMIPENEM <=0.25 SENSITIVE Sensitive     TRIMETH/SULFA <=20 SENSITIVE Sensitive     AMPICILLIN/SULBACTAM <=2 SENSITIVE Sensitive     PIP/TAZO <=4 SENSITIVE Sensitive     Extended ESBL NEGATIVE Sensitive     * >=100,000 COLONIES/mL ESCHERICHIA COLI  SARS Coronavirus 2 [facility] order, Performed in [facility] hospital lab) Nasopharyngeal  Nasopharyngeal Swab     Status: None   Collection Time: 06/20/19  2:05 AM   Specimen: Nasopharyngeal Swab  Result Value Ref Range Status   SARS Coronavirus 2 NEGATIVE NEGATIVE Final    Comment: (NOTE) If result is NEGATIVE SARS-CoV-2 target nucleic acids are NOT DETECTED. The SARS-CoV-2 RNA is generally detectable in upper and lower  respiratory specimens during the acute phase of infection. The lowest  concentration of SARS-CoV-2 viral copies this assay can detect is 250  copies / mL. A negative result does not preclude SARS-CoV-2 infection  and should not be used as the sole basis for treatment or other  patient management decisions.  A negative result may occur with  improper specimen collection / handling, submission of specimen other  than nasopharyngeal swab, presence of viral mutation(s) within the  areas targeted by this assay, and inadequate number of viral copies  (<250 copies / mL). A negative result must be combined with clinical  observations, patient history, and epidemiological information. If result is POSITIVE SARS-CoV-2 target nucleic acids are DETECTED. The SARS-CoV-2  RNA is generally detectable in upper and lower  respiratory specimens dur ing the acute phase of infection.  Positive  results are indicative of active infection with SARS-CoV-2.  Clinical  correlation with patient history and other diagnostic information is  necessary to determine patient infection status.  Positive results do  not rule out bacterial infection or co-infection with other viruses. If result is PRESUMPTIVE POSTIVE SARS-CoV-2 nucleic acids MAY BE PRESENT.   A presumptive positive result was obtained on the submitted specimen  and confirmed on repeat testing.  While 2019 novel coronavirus  (SARS-CoV-2) nucleic acids may be present in the submitted sample  additional confirmatory testing may be necessary for epidemiological  and / or clinical management purposes  to differentiate between  SARS-CoV-2 and other Sarbecovirus currently known to infect humans.  If clinically indicated additional testing with an alternate test  methodology [phone]) is advised. The SARS-CoV-2 RNA is generally  detectable in upper and lower respiratory sp ecimens during the acute  phase of infection. The expected result is Negative. Fact Sheet for Patients:  StrictlyIdeas.no Fact Sheet for Healthcare Providers: BankingDealers.co.za This test is not yet approved or cleared by the [place] FDA and has been authorized for detection and/or diagnosis of SARS-CoV-2 by FDA under an Emergency Use Authorization (EUA).  This EUA will remain in effect (meaning this test can be used) for the duration of the COVID-19 declaration under Section 564(b)(1) of the Act, 21 U.S.C. section 360bbb-3(b)(1), unless the authorization is terminated or revoked sooner. Performed at [facility], [place] [address] [place], [place] [address]      Time coordinating discharge: 35 minutes  SIGNED:   Cordelia Poche, MD [facility] 06/24/2019, 1:53  PM

## 2019-06-22 NOTE — TOC Initial Note (Signed)
Transition of Care [facility]) - Initial/Assessment Note    Patient Details  Name: [name] [name] MRN: [id number] Date of Birth: May 12, 1951  [facility]) CM/SW Contact:    Joaquin Courts, RN Phone Number: 06/22/2019, 12:41 PM  Clinical Narrative: Patient active with [facility] prior to admit, rep notified of impending discharge and resumption of care orders.                   Expected Discharge Plan: [place] Barriers to Discharge: Continued Medical Work up   Patient Goals and CMS Choice Patient states their goals for this hospitalization and ongoing recovery are:: to go home      Expected Discharge Plan and Services Expected Discharge Plan: [place]   Discharge Planning Services: CM Consult Post Acute Care Choice: Resumption of Svcs/PTA Provider Living arrangements for the past 2 months: Single Family Home Expected Discharge Date: 06/22/19               DME Arranged: N/A DME Agency: NA       HH Arranged: OT, PT [place] Agency: [facility]formerly [employer]) Date [place]: 06/22/19 Time [place]: 81 Representative spoke with at [place]: Alwyn Ren  Prior Living Arrangements/Services Living arrangements for the past 2 months: [place] with:: Spouse Patient language and need for interpreter reviewed:: Yes Do you feel safe going back to the place where you live?: Yes      Need for Family Participation in Patient Care: Yes (Comment) Care giver support system in place?: Yes (comment)   Criminal Activity/Legal Involvement Pertinent to Current Situation/Hospitalization: No - Comment as needed  Activities of Daily Living Home Assistive Devices/Equipment: Walker (specify type)(front wheel) ADL Screening (condition at time of admission) Patient's cognitive ability adequate to safely complete daily activities?: Yes Is the patient deaf or have difficulty hearing?: No Does the  patient have difficulty seeing, even when wearing glasses/contacts?: No Does the patient have difficulty concentrating, remembering, or making decisions?: Yes Patient able to express need for assistance with ADLs?: Yes Does the patient have difficulty dressing or bathing?: No Independently performs ADLs?: No Communication: Independent Dressing (OT): Independent Grooming: Independent Feeding: Independent Bathing: Needs assistance Is this a change from baseline?: Pre-admission baseline Toileting: Needs assistance Is this a change from baseline?: Pre-admission baseline In/Out Bed: Needs assistance Is this a change from baseline?: Pre-admission baseline Walks in Home: Needs assistance Is this a change from baseline?: Pre-admission baseline Does the patient have difficulty walking or climbing stairs?: Yes Weakness of Legs: Both Weakness of Arms/Hands: None  Permission Sought/Granted                  Emotional Assessment Appearance:: Appears stated age Attitude/Demeanor/Rapport: Engaged Affect (typically observed): Accepting Orientation: : Oriented to Place, Oriented to  Time, Oriented to Situation, Oriented to Self   Psych Involvement: No (comment)  Admission diagnosis:  Weakness [R53.1] Acute cystitis without hematuria [N30.00] Fever [R50.9] Patient Active Problem List   Diagnosis Date Noted  . Fever 06/20/2019  . Acute lower UTI 06/20/2019  . Pressure injury of skin 06/20/2019  . Sepsis ([place]) 06/20/2019  . Chronic idiopathic constipation 03/18/2019  . Hallucination 02/22/2019  . Overactive bladder 02/04/2019  . Postoperative pain   . Labile blood pressure   . Dysphagia   . Episode of recurrent major depressive disorder ([place])   . Hypoalbuminemia due to protein-calorie malnutrition ([place])   . Left  displaced femoral neck fracture ([place]) 12/11/2018  . Closed fracture of neck of left femur ([place])   . Closed left hip fracture ([place]) 12/08/2018  . TMJ (temporomandibular  joint syndrome) 10/10/2017  . Parkinson's disease (tremor, stiffness, slow motion, unstable posture) ([place]) 03/20/2017  . Bilateral lower extremity edema 03/20/2017  . Chronic venous insufficiency 09/13/2016  . Fatigue 03/15/2016  . Parkinsonian features 09/15/2015  . ED (erectile dysfunction) 09/15/2015  . MDD (major depressive disorder), recurrent episode, severe ([place]) 02/10/2014  . Nonspecific abnormal electrocardiogram (ECG) (EKG) 02/07/2014  . Non-compliant behavior 02/03/2014  . Community acquired pneumonia of left lower lobe of lung ([place]) 10/02/2013  . Morton's neuroma of left foot 08/07/2013  . Attention deficit disorder without mention of hyperactivity 03/13/2012  . Hyperglycemia 01/26/2012  . Hypogonadism male 01/24/2012   PCP:  [name], MD Pharmacy:   [facility] [place], [place] [address] [place] [address] Phone: [phone] Fax: [phone]     Social Determinants of Health (SDOH) Interventions    Readmission Risk Interventions No flowsheet data found.

## 2019-06-22 NOTE — Progress Notes (Signed)
PHARMACY - PHYSICIAN COMMUNICATION CRITICAL VALUE ALERT - BLOOD CULTURE IDENTIFICATION (BCID)  [name] [name] is an 68 y.o. male who presented to [facility] on 06/19/2019 with a chief complaint of fever.  Assessment:  urine (include suspected source if known) BCID 1 of 4 bottles staph species, likely contaminant Name of physician (or Provider) Contacted: Eilleen Kempf, NP  Current antibiotics: roc/zmax  Changes to prescribed antibiotics recommended:  Patient is on recommended antibiotics - No changes needed  Results for orders placed or performed during the hospital encounter of 06/19/19  Blood Culture ID Panel (Reflexed) (Collected: 06/19/2019  8:04 PM)  Result Value Ref Range   Enterococcus species NOT DETECTED NOT DETECTED   Listeria monocytogenes NOT DETECTED NOT DETECTED   Staphylococcus species DETECTED (A) NOT DETECTED   Staphylococcus aureus (BCID) NOT DETECTED NOT DETECTED   Methicillin resistance NOT DETECTED NOT DETECTED   Streptococcus species NOT DETECTED NOT DETECTED   Streptococcus agalactiae NOT DETECTED NOT DETECTED   Streptococcus pneumoniae NOT DETECTED NOT DETECTED   Streptococcus pyogenes NOT DETECTED NOT DETECTED   Acinetobacter baumannii NOT DETECTED NOT DETECTED   Enterobacteriaceae species NOT DETECTED NOT DETECTED   Enterobacter cloacae complex NOT DETECTED NOT DETECTED   Escherichia coli NOT DETECTED NOT DETECTED   Klebsiella oxytoca NOT DETECTED NOT DETECTED   Klebsiella pneumoniae NOT DETECTED NOT DETECTED   Proteus species NOT DETECTED NOT DETECTED   Serratia marcescens NOT DETECTED NOT DETECTED   Haemophilus influenzae NOT DETECTED NOT DETECTED   Neisseria meningitidis NOT DETECTED NOT DETECTED   Pseudomonas aeruginosa NOT DETECTED NOT DETECTED   Candida albicans NOT DETECTED NOT DETECTED   Candida glabrata NOT DETECTED NOT DETECTED   Candida krusei NOT DETECTED NOT DETECTED   Candida parapsilosis NOT DETECTED NOT DETECTED   Candida tropicalis NOT  DETECTED NOT DETECTED    Dorrene German 06/22/2019  4:51 AM

## 2019-06-22 NOTE — Discharge Instructions (Signed)
[name] [name],  You were admitted with concern for pneumonia and UTI. You have been started on appropriate antibiotic therapy. You received 4 days of IV antibiotics and will continue treatment on discharge with oral antibiotics. Your Celexa has been dose adjusted for safety secondary to your age; this can be discussed with your primary care physician.

## 2019-06-23 LAB — CULTURE, BLOOD (ROUTINE X 2): Special Requests: ADEQUATE

## 2019-06-23 MED ORDER — BISACODYL 10 MG RE SUPP
10.0000 mg | Freq: Once | RECTAL | Status: AC
  Administered 2019-06-23: 10 mg via RECTAL
  Filled 2019-06-23: qty 1

## 2019-06-23 MED ORDER — POLYETHYLENE GLYCOL 3350 17 G PO PACK: 17.0000 g | PACK | Freq: Every day | ORAL | Status: DC

## 2019-06-23 MED ORDER — SODIUM CHLORIDE 0.9 % IV SOLN
INTRAVENOUS | Status: DC | PRN
  Administered 2019-06-23: 250 mL via INTRAVENOUS

## 2019-06-23 NOTE — Progress Notes (Addendum)
CSW received a call from pt's wife Joycelyn Schmid stating her husband had been referred to and received a call from admissions at  [facility] but pt's wife states she prefers that pt goes to [facility] as [facility] is close to her home.  Janie in admissions at [facility] updated and will review.  Pt's wife also states she will need a hospital bed for her home for when pt discharges from the SNF, but would like to initiate that process now.  CSW updated by weekend [facility] RN CM that the SNF staff will have to make arrangements for a bed.  Wife updated.  2nd shift ED CSW will leave handoff for 1st shift CSW.  CSW will continue to follow for D/C needs.  Alphonse Guild. Kree Rafter, LCSW, LCAS, CSI Transitions of Care Clinical Social Worker Care Coordination Department Ph: [phone]

## 2019-06-23 NOTE — Progress Notes (Signed)
PROGRESS NOTE    [name] [name]  [id number] DOB: Jan 20, 1951 DOA: 06/19/2019 PCP: [name], MD   Brief Narrative: [name] [name] is a 68 y.o. malewith medical history significant ofParkinson's disease. Patient presented secondary to fevers and weakness and found to have evidence of sepsis physiology in setting of UTI and pneumonia.   Assessment & Plan:   Principal Problem:   Acute lower UTI Active Problems:   Community acquired pneumonia of left lower lobe of lung ([place])   Parkinson's disease (tremor, stiffness, slow motion, unstable posture) (HCC)   Fever   Pressure injury of skin   Sepsis ([place])   Sepsis Secondary to urinary tract infection. Physiology resolved. Present on admission. Blood culture significant for staph species in 1/4 bottles; likely contaminant  E coli UTI Pansensitive. Empirically treated with Ceftriaxone and will continue until discharge to SNF . Will treat for a total of 7 days  Possible pneumonia Empiric treatment with Ceftriaxone and azithromycin -Continue treatment  Depression -Continue Wellbutrin  Parkinson disease -Continue Sinemet  Constipation -Dulcolax suppository x1; miralax  Urinary incontinence -Continue Toviaz  Obesity Body mass index is 35.38 kg/m.  Pressure injury Medial sacrum. POA   DVT prophylaxis: Lovenox Code Status:   Code Status: Full Code Family Communication: Wife at bedside Disposition Plan: Discharge to SNF   Consultants:   None  Procedures:   None  Antimicrobials:  Ceftriaxone  Azithromycin    Subjective: Feels like he needs to have a bowel movement but cannot  Objective: Vitals:   06/21/19 2111 06/22/19 0724 06/22/19 1351 06/22/19 2208  BP: 135/83 131/78 111/73 137/84  Pulse: 82 61 79 73  Resp: 16 20 12 16   Temp: 98.2 F (36.8 C)  (!) 97.4 F (36.3 C) 98.8 F (37.1 C)  TempSrc: Oral  Oral Oral  SpO2: 95% 93% 93% 93%  Weight:      Height:         Intake/Output Summary (Last 24 hours) at 06/23/2019 1016 Last data filed at 06/23/2019 0800 Gross per 24 hour  Intake 600 ml  Output 2650 ml  Net -2050 ml   Filed Weights   06/20/19 0602  Weight: 125 kg    Examination:  General exam: Appears calm and comfortable Respiratory system: Clear to auscultation. Respiratory effort normal. Cardiovascular system: S1 & S2 heard, RRR. No murmurs, rubs, gallops or clicks. Gastrointestinal system: Abdomen is nondistended, soft and nontender. No organomegaly or masses felt. Normal bowel sounds heard. Central nervous system: Alert and oriented. Extremities: No edema. No calf tenderness Skin: No cyanosis. No rashes Psychiatry: Judgement and insight appear normal. Mood & affect appropriate.     Data Reviewed: I have personally reviewed following labs and imaging studies  CBC: Recent Labs  Lab 06/19/19 2004 06/20/19 0609 06/21/19 0454 06/22/19 0433  WBC 10.3 8.1 6.5 5.3  NEUTROABS 9.2*  --   --   --   HGB 10.7* 10.5* 10.3* 10.1*  HCT 32.3* 32.5* 31.7* 31.3*  MCV 92.8 93.9 93.2 93.7  PLT 253 237 245 [id number]   Basic Metabolic Panel: Recent Labs  Lab 06/19/19 2004 06/20/19 0609 06/21/19 0454 06/22/19 0433  NA 132* 135 139 141  K 3.9 3.4* 3.5 3.8  CL 99 105 109 107  CO2 22 19* 23 24  GLUCOSE 125* 105* 105* 102*  BUN 17 13 13 13   CREATININE 1.20 1.07 0.99 1.00  CALCIUM 8.4* 8.4* 8.6* 8.6*  MG  --   --  2.1 2.2   GFR:  Estimated Creatinine Clearance: 103.4 mL/min (by C-G formula based on SCr of 1 mg/dL). Liver Function Tests: Recent Labs  Lab 06/19/19 2004  AST 52*  ALT 10  ALKPHOS 83  BILITOT 0.8  PROT 6.7  ALBUMIN 3.1*   No results for input(s): LIPASE, AMYLASE in the last 168 hours. No results for input(s): AMMONIA in the last 168 hours. Coagulation Profile: No results for input(s): INR, PROTIME in the last 168 hours. Cardiac Enzymes: No results for input(s): CKTOTAL, CKMB, CKMBINDEX, TROPONINI in the last 168 hours.  BNP (last 3 results) No results for input(s): PROBNP in the last 8760 hours. HbA1C: No results for input(s): HGBA1C in the last 72 hours. CBG: No results for input(s): GLUCAP in the last 168 hours. Lipid Profile: No results for input(s): CHOL, HDL, LDLCALC, TRIG, CHOLHDL, LDLDIRECT in the last 72 hours. Thyroid Function Tests: No results for input(s): TSH, T4TOTAL, FREET4, T3FREE, THYROIDAB in the last 72 hours. Anemia Panel: No results for input(s): VITAMINB12, FOLATE, FERRITIN, TIBC, IRON, RETICCTPCT in the last 72 hours. Sepsis Labs: Recent Labs  Lab 06/19/19 2004  LATICACIDVEN 1.9    Recent Results (from the past 240 hour(s))  Novel Coronavirus, NAA ([facility])     Status: None   Collection Time: 06/19/19 12:00 AM   Specimen: Oropharyngeal(OP) collection in vial transport medium   OROPHARYNGEA  TESTING  Result Value Ref Range Status   SARS-CoV-2, NAA Not Detected Not Detected Final    Comment: This nucleic acid amplification test was developed and its performance characteristics determined by [employer]. Nucleic acid amplification tests include PCR and TMA. This test has not been FDA cleared or approved. This test has been authorized by FDA under an Emergency Use Authorization (EUA). This test is only authorized for the duration of time the declaration that circumstances exist justifying the authorization of the emergency use of in vitro diagnostic tests for detection of SARS-CoV-2 virus and/or diagnosis of COVID-19 infection under section 564(b)(1) of the Act, 21 U.S.C. [id number]) (1), unless the authorization is terminated or revoked sooner. When diagnostic testing is negative, the possibility of a false negative result should be considered in the context of a patient's recent exposures and the presence of clinical signs and symptoms consistent with COVID-19. An individual without symptoms of COVID-19 and who is not shedding SARS-CoV-2 virus would  expect to  have a negative (not detected) result in this assay.   Blood culture (routine x 2)     Status: Abnormal   Collection Time: 06/19/19  8:04 PM   Specimen: BLOOD  Result Value Ref Range Status   Specimen Description   Final    BLOOD BLOOD LEFT FOREARM Performed at [facility] [address] [place], [place] [address]    Special Requests   Final    BOTTLES DRAWN AEROBIC AND ANAEROBIC Blood Culture adequate volume Performed at [facility] [address] [place], [place] [address]    Culture  Setup Time   Final    GRAM POSITIVE COCCI AEROBIC BOTTLE ONLY CRITICAL RESULT CALLED TO, READ BACK BY AND VERIFIED WITH: PHRMD B GREEN @ 0410 06/22/19 BY  S GEZAHEGN    Culture (A)  Final    STAPHYLOCOCCUS SPECIES (COAGULASE NEGATIVE) THE SIGNIFICANCE OF ISOLATING THIS ORGANISM FROM A SINGLE SET OF BLOOD CULTURES WHEN MULTIPLE SETS ARE DRAWN IS UNCERTAIN. PLEASE NOTIFY THE MICROBIOLOGY DEPARTMENT WITHIN ONE WEEK IF SPECIATION AND SENSITIVITIES ARE REQUIRED. Performed at [facility], [place]  [address], [place], [place] [address]    Report Status 06/23/2019 FINAL  Final  Blood Culture ID Panel (Reflexed)     Status: Abnormal   Collection Time: 06/19/19  8:04 PM  Result Value Ref Range Status   Enterococcus species NOT DETECTED NOT DETECTED Final   Listeria monocytogenes NOT DETECTED NOT DETECTED Final   Staphylococcus species DETECTED (A) NOT DETECTED Final    Comment: Methicillin (oxacillin) susceptible coagulase negative staphylococcus. Possible blood culture contaminant (unless isolated from more than one blood culture draw or clinical case suggests pathogenicity). No antibiotic treatment is indicated for blood  culture contaminants. CRITICAL RESULT CALLED TO, READ BACK BY AND VERIFIED WITH: PHRMD B GREEN @ 0410 06/22/19 BY  S GEZAHEGN    Staphylococcus aureus (BCID) NOT DETECTED NOT DETECTED Final   Methicillin resistance NOT DETECTED NOT DETECTED  Final   Streptococcus species NOT DETECTED NOT DETECTED Final   Streptococcus agalactiae NOT DETECTED NOT DETECTED Final   Streptococcus pneumoniae NOT DETECTED NOT DETECTED Final   Streptococcus pyogenes NOT DETECTED NOT DETECTED Final   Acinetobacter baumannii NOT DETECTED NOT DETECTED Final   Enterobacteriaceae species NOT DETECTED NOT DETECTED Final   Enterobacter cloacae complex NOT DETECTED NOT DETECTED Final   Escherichia coli NOT DETECTED NOT DETECTED Final   Klebsiella oxytoca NOT DETECTED NOT DETECTED Final   Klebsiella pneumoniae NOT DETECTED NOT DETECTED Final   Proteus species NOT DETECTED NOT DETECTED Final   Serratia marcescens NOT DETECTED NOT DETECTED Final   Haemophilus influenzae NOT DETECTED NOT DETECTED Final   Neisseria meningitidis NOT DETECTED NOT DETECTED Final   Pseudomonas aeruginosa NOT DETECTED NOT DETECTED Final   Candida albicans NOT DETECTED NOT DETECTED Final   Candida glabrata NOT DETECTED NOT DETECTED Final   Candida krusei NOT DETECTED NOT DETECTED Final   Candida parapsilosis NOT DETECTED NOT DETECTED Final   Candida tropicalis NOT DETECTED NOT DETECTED Final    Comment: Performed at [facility], [place]. [address], [place], [place] [address]  Blood culture (routine x 2)     Status: None (Preliminary result)   Collection Time: 06/19/19  8:09 PM   Specimen: BLOOD RIGHT HAND  Result Value Ref Range Status   Specimen Description   Final    BLOOD RIGHT HAND Performed at [facility] [address], [place], [place] [address]    Special Requests   Final    BOTTLES DRAWN AEROBIC AND ANAEROBIC Blood Culture results may not be optimal due to an inadequate volume of blood received in culture bottles Performed at [facility] [address] [place], [place] [address]    Culture   Final    NO GROWTH 3 DAYS Performed at [facility], [place] [address] [place], [place] [address]    Report Status PENDING   Incomplete  Urine Culture     Status: Abnormal   Collection Time: 06/19/19 10:14 PM   Specimen: Urine  Result Value Ref Range Status   Specimen Description   Final    Urine Performed at [facility] [address], [place], [place] [address]    Special Requests   Final    NONE Performed at [facility], [place] [address], [place], [place] [address]    Culture >=100,000 COLONIES/mL ESCHERICHIA COLI (A)  Final   Report Status 06/22/2019 FINAL  Final   Organism ID, Bacteria ESCHERICHIA COLI (A)  Final      Susceptibility   Escherichia coli -  MIC*    AMPICILLIN <=2 SENSITIVE Sensitive     CEFAZOLIN <=4 SENSITIVE Sensitive     CEFEPIME <=1 SENSITIVE Sensitive     CEFTAZIDIME <=1 SENSITIVE Sensitive     CEFTRIAXONE <=1 SENSITIVE Sensitive     CIPROFLOXACIN <=0.25 SENSITIVE Sensitive     GENTAMICIN <=1 SENSITIVE Sensitive     IMIPENEM <=0.25 SENSITIVE Sensitive     TRIMETH/SULFA <=20 SENSITIVE Sensitive     AMPICILLIN/SULBACTAM <=2 SENSITIVE Sensitive     PIP/TAZO <=4 SENSITIVE Sensitive     Extended ESBL NEGATIVE Sensitive     * >=100,000 COLONIES/mL ESCHERICHIA COLI  SARS Coronavirus 2 [facility] order, Performed in [facility] hospital lab) Nasopharyngeal Nasopharyngeal Swab     Status: None   Collection Time: 06/20/19  2:05 AM   Specimen: Nasopharyngeal Swab  Result Value Ref Range Status   SARS Coronavirus 2 NEGATIVE NEGATIVE Final    Comment: (NOTE) If result is NEGATIVE SARS-CoV-2 target nucleic acids are NOT DETECTED. The SARS-CoV-2 RNA is generally detectable in upper and lower  respiratory specimens during the acute phase of infection. The lowest  concentration of SARS-CoV-2 viral copies this assay can detect is 250  copies / mL. A negative result does not preclude SARS-CoV-2 infection  and should not be used as the sole basis for treatment or other  patient management decisions.  A negative result may occur with  improper specimen  collection / handling, submission of specimen other  than nasopharyngeal swab, presence of viral mutation(s) within the  areas targeted by this assay, and inadequate number of viral copies  (<250 copies / mL). A negative result must be combined with clinical  observations, patient history, and epidemiological information. If result is POSITIVE SARS-CoV-2 target nucleic acids are DETECTED. The SARS-CoV-2 RNA is generally detectable in upper and lower  respiratory specimens dur ing the acute phase of infection.  Positive  results are indicative of active infection with SARS-CoV-2.  Clinical  correlation with patient history and other diagnostic information is  necessary to determine patient infection status.  Positive results do  not rule out bacterial infection or co-infection with other viruses. If result is PRESUMPTIVE POSTIVE SARS-CoV-2 nucleic acids MAY BE PRESENT.   A presumptive positive result was obtained on the submitted specimen  and confirmed on repeat testing.  While 2019 novel coronavirus  (SARS-CoV-2) nucleic acids may be present in the submitted sample  additional confirmatory testing may be necessary for epidemiological  and / or clinical management purposes  to differentiate between  SARS-CoV-2 and other Sarbecovirus currently known to infect humans.  If clinically indicated additional testing with an alternate test  methodology [phone]) is advised. The SARS-CoV-2 RNA is generally  detectable in upper and lower respiratory sp ecimens during the acute  phase of infection. The expected result is Negative. Fact Sheet for Patients:  StrictlyIdeas.no Fact Sheet for Healthcare Providers: BankingDealers.co.za This test is not yet approved or cleared by the [place] FDA and has been authorized for detection and/or diagnosis of SARS-CoV-2 by FDA under an Emergency Use Authorization (EUA).  This EUA will remain in effect  (meaning this test can be used) for the duration of the COVID-19 declaration under Section 564(b)(1) of the Act, 21 U.S.C. section 360bbb-3(b)(1), unless the authorization is terminated or revoked sooner. Performed at [facility], [place] [address], [place], [place] [address]          Radiology Studies: No results found.      Scheduled Meds: .  azithromycin  250 mg Oral Daily  . buPROPion  150 mg Oral QHS  . buPROPion  300 mg Oral Daily  . carbidopa-levodopa  2 tablet Oral 3 times per day  . citalopram  40 mg Oral Daily  . enoxaparin (LOVENOX) injection  40 mg Subcutaneous Q24H  . fesoterodine  8 mg Oral Daily  . [name]  2.5 mg Oral QHS  . pneumococcal 23 valent vaccine  0.5 mL Intramuscular Tomorrow-1000  . polyethylene glycol  17 g Oral Daily   Continuous Infusions: . cefTRIAXone (ROCEPHIN)  IV 2 g (06/22/19 2105)     LOS: 3 days     Cordelia Poche, MD [facility] 06/23/2019, 10:16 AM  If 7PM-7AM, please contact night-coverage [email or web address]

## 2019-06-24 DIAGNOSIS — F060 Psychotic disorder with hallucinations due to known physiological condition: Secondary | ICD-10-CM | POA: Diagnosis not present

## 2019-06-24 DIAGNOSIS — R2689 Other abnormalities of gait and mobility: Secondary | ICD-10-CM | POA: Diagnosis not present

## 2019-06-24 DIAGNOSIS — R52 Pain, unspecified: Secondary | ICD-10-CM | POA: Diagnosis not present

## 2019-06-24 DIAGNOSIS — K5904 Chronic idiopathic constipation: Secondary | ICD-10-CM | POA: Diagnosis not present

## 2019-06-24 DIAGNOSIS — F339 Major depressive disorder, recurrent, unspecified: Secondary | ICD-10-CM | POA: Diagnosis not present

## 2019-06-24 DIAGNOSIS — J189 Pneumonia, unspecified organism: Secondary | ICD-10-CM | POA: Diagnosis not present

## 2019-06-24 DIAGNOSIS — M2550 Pain in unspecified joint: Secondary | ICD-10-CM | POA: Diagnosis not present

## 2019-06-24 DIAGNOSIS — R5381 Other malaise: Secondary | ICD-10-CM | POA: Diagnosis not present

## 2019-06-24 DIAGNOSIS — N390 Urinary tract infection, site not specified: Secondary | ICD-10-CM | POA: Diagnosis not present

## 2019-06-24 DIAGNOSIS — E669 Obesity, unspecified: Secondary | ICD-10-CM | POA: Diagnosis not present

## 2019-06-24 DIAGNOSIS — A419 Sepsis, unspecified organism: Secondary | ICD-10-CM | POA: Diagnosis not present

## 2019-06-24 DIAGNOSIS — R0902 Hypoxemia: Secondary | ICD-10-CM | POA: Diagnosis not present

## 2019-06-24 DIAGNOSIS — L89312 Pressure ulcer of right buttock, stage 2: Secondary | ICD-10-CM | POA: Diagnosis not present

## 2019-06-24 DIAGNOSIS — K5900 Constipation, unspecified: Secondary | ICD-10-CM | POA: Diagnosis not present

## 2019-06-24 DIAGNOSIS — K5909 Other constipation: Secondary | ICD-10-CM | POA: Diagnosis not present

## 2019-06-24 DIAGNOSIS — R278 Other lack of coordination: Secondary | ICD-10-CM | POA: Diagnosis not present

## 2019-06-24 DIAGNOSIS — Z20828 Contact with and (suspected) exposure to other viral communicable diseases: Secondary | ICD-10-CM | POA: Diagnosis not present

## 2019-06-24 DIAGNOSIS — M6281 Muscle weakness (generalized): Secondary | ICD-10-CM | POA: Diagnosis not present

## 2019-06-24 DIAGNOSIS — R41841 Cognitive communication deficit: Secondary | ICD-10-CM | POA: Diagnosis not present

## 2019-06-24 DIAGNOSIS — G20 Parkinson's disease: Secondary | ICD-10-CM | POA: Diagnosis not present

## 2019-06-24 DIAGNOSIS — L89159 Pressure ulcer of sacral region, unspecified stage: Secondary | ICD-10-CM | POA: Diagnosis not present

## 2019-06-24 DIAGNOSIS — L89152 Pressure ulcer of sacral region, stage 2: Secondary | ICD-10-CM | POA: Diagnosis not present

## 2019-06-24 DIAGNOSIS — R32 Unspecified urinary incontinence: Secondary | ICD-10-CM | POA: Diagnosis not present

## 2019-06-24 DIAGNOSIS — A4151 Sepsis due to Escherichia coli [E. coli]: Secondary | ICD-10-CM | POA: Diagnosis not present

## 2019-06-24 DIAGNOSIS — F329 Major depressive disorder, single episode, unspecified: Secondary | ICD-10-CM | POA: Diagnosis not present

## 2019-06-24 DIAGNOSIS — Z7401 Bed confinement status: Secondary | ICD-10-CM | POA: Diagnosis not present

## 2019-06-24 LAB — CULTURE, BLOOD (ROUTINE X 2): Culture: NO GROWTH

## 2019-06-24 MED ORDER — BISACODYL 10 MG RE SUPP: 10.0000 mg | Freq: Every day | RECTAL | Status: DC | PRN

## 2019-06-24 MED ORDER — CEFDINIR 300 MG PO CAPS: 300.0000 mg | ORAL_CAPSULE | Freq: Two times a day (BID) | ORAL | Status: AC

## 2019-06-24 MED ORDER — BISACODYL 5 MG PO TBEC: 5.0000 mg | DELAYED_RELEASE_TABLET | Freq: Every day | ORAL | Status: DC | PRN

## 2019-06-24 MED ORDER — POLYETHYLENE GLYCOL 3350 17 G PO PACK: 17.0000 g | PACK | Freq: Every day | ORAL | Status: DC

## 2019-06-24 NOTE — Care Management Important Message (Signed)
Important Message  Patient Details IM Letter given to Marney Doctor RN to present to the Patient Name: NICOLO SZABLEWSKI MRN: [id number] Date of Birth: Sep 05, 1951   Medicare Important Message Given:  Yes     Kerin Salen 06/24/2019, 10:54 AM

## 2019-06-24 NOTE — Progress Notes (Signed)
Attempted to call report x2 to [facility]. Pt discharged via [facility] in stable condition. Packet given to [facility]. No immediate questions or concerns expressed by patient or spouse.

## 2019-06-24 NOTE — TOC Transition Note (Signed)
Transition of Care [facility]) - CM/SW Discharge Note   Patient Details  Name: [name] [name] MRN: [id number] Date of Birth: 03-10-1951  Transition of Care [facility]) CM/SW Contact:  Aarica Wax, Marjie Skiff, RN Phone Number:[phone] 06/24/2019, 1:48 PM   Clinical Narrative:    Manual review for [facility] completed and [facility] number received [id number] E. MD to do DC summary. Pt to go to [facility] room 3239. RN to call [phone] for report. [facility] to transport pt.

## 2019-06-24 NOTE — Consult Note (Signed)
   [facility] CM Inpatient Consult   06/24/2019  SAYED PIWOWAR 08/07/1951 [id number]   Patient screened for potential [facility] Care Management services due to unplanned readmission risk score of 16%. Member in [facility] under [employer] plan.   Per chart review, current disposition plan is for SNF. No [facility] identifiable needs at this time.  Netta Cedars, MSN, [facility] Liaison Nurse Mobile Phone [phone]  Toll free office [phone]

## 2019-06-25 ENCOUNTER — Telehealth: Payer: Self-pay | Admitting: Neurology

## 2019-06-25 ENCOUNTER — Telehealth (HOSPITAL_COMMUNITY): Payer: Self-pay

## 2019-06-25 DIAGNOSIS — F329 Major depressive disorder, single episode, unspecified: Secondary | ICD-10-CM | POA: Diagnosis not present

## 2019-06-25 DIAGNOSIS — A4151 Sepsis due to Escherichia coli [E. coli]: Secondary | ICD-10-CM | POA: Diagnosis not present

## 2019-06-25 DIAGNOSIS — F060 Psychotic disorder with hallucinations due to known physiological condition: Secondary | ICD-10-CM | POA: Diagnosis not present

## 2019-06-25 DIAGNOSIS — G20 Parkinson's disease: Secondary | ICD-10-CM | POA: Diagnosis not present

## 2019-06-25 NOTE — Telephone Encounter (Signed)
Patients wife called, she is very upset because her husband went to the hospital and he was transferred to a nursing home. They reduced his Celexa to 20 mgs and patients wife wants it increased back up to 40 mgs. Please review and advise, thank you

## 2019-06-25 NOTE — Telephone Encounter (Signed)
Called patient spouse she was made aware of provider response. She is requesting that someone speak with [facility] about having patient medication change to the correct does written by Dr. Carles Collet. Called spoke with Freda Munro she states that [name] Bales is in a meeting. Freda Munro was given the message and will pass it along to her.

## 2019-06-25 NOTE — Telephone Encounter (Signed)
She need to follow the provider at nursing home.  I am happy to talk to them if they like to communicate with me.  I cannot override their orders since they are not treating physician at this time.

## 2019-06-25 NOTE — Telephone Encounter (Signed)
Unfortunately, that issue needs to be addressed with [facility] directly.  I don't have any credentials there.  She can request a family conference there but I personally cannot help if they already know times that medications are supposed to be given

## 2019-06-25 NOTE — Telephone Encounter (Signed)
See below Patient currently in hospital

## 2019-06-25 NOTE — Telephone Encounter (Signed)
I called patients wife back and let her know what Dr. Adele Schilder said, she is going to reach out to the nursing home doctor

## 2019-06-25 NOTE — Telephone Encounter (Signed)
Patient's wife called and is needing to let Dr. Carles Collet know that [name] [name] has been in the Hospital due to sliding out of his chair and not being able to move. She said she had to call EMS to come and take him to the Hospital. She said he went to [name] afterwards. She said he had Pneumonia as well as a UTI. She said [facility] is not giving him his medication like they should. The nurse is Lura Em and Dr. Jori Moll Polite [phone] Room [id number]. Thanks

## 2019-06-26 DIAGNOSIS — L89159 Pressure ulcer of sacral region, unspecified stage: Secondary | ICD-10-CM | POA: Diagnosis not present

## 2019-06-26 DIAGNOSIS — G20 Parkinson's disease: Secondary | ICD-10-CM | POA: Diagnosis not present

## 2019-06-26 DIAGNOSIS — N390 Urinary tract infection, site not specified: Secondary | ICD-10-CM | POA: Diagnosis not present

## 2019-06-26 DIAGNOSIS — A419 Sepsis, unspecified organism: Secondary | ICD-10-CM | POA: Diagnosis not present

## 2019-06-26 DIAGNOSIS — J189 Pneumonia, unspecified organism: Secondary | ICD-10-CM | POA: Diagnosis not present

## 2019-06-26 DIAGNOSIS — F329 Major depressive disorder, single episode, unspecified: Secondary | ICD-10-CM | POA: Diagnosis not present

## 2019-06-27 ENCOUNTER — Ambulatory Visit: Payer: Medicare Other | Admitting: Neurology

## 2019-06-27 ENCOUNTER — Other Ambulatory Visit: Payer: Medicare Other

## 2019-07-02 ENCOUNTER — Other Ambulatory Visit: Payer: Self-pay | Admitting: Neurology

## 2019-07-02 DIAGNOSIS — N390 Urinary tract infection, site not specified: Secondary | ICD-10-CM | POA: Diagnosis not present

## 2019-07-02 DIAGNOSIS — G20 Parkinson's disease: Secondary | ICD-10-CM | POA: Diagnosis not present

## 2019-07-02 DIAGNOSIS — L89152 Pressure ulcer of sacral region, stage 2: Secondary | ICD-10-CM | POA: Diagnosis not present

## 2019-07-02 DIAGNOSIS — F329 Major depressive disorder, single episode, unspecified: Secondary | ICD-10-CM | POA: Diagnosis not present

## 2019-07-02 NOTE — Telephone Encounter (Signed)
Requested Prescriptions   Pending Prescriptions Disp Refills  . rOPINIRole (REQUIP) 1 MG tablet [Pharmacy Med Name: ROPINIROLE HCL 1 MG TABLET] 270 tablet 1    Sig: TAKE 1 TABLET (1 MG TOTAL) BY MOUTH 3 (THREE) TIMES DAILY.   Rx last filled:not on current medication list  Pt last seen:05/28/19   ASSESSMENT/PLAN:  1. Parkinsonism -In 1 week, decrease ropinirole 0.5 mg from twice per day, to 0.5 mg once per day for a week and then discontinue the medication. Follow up appt scheduled: 09/18/19

## 2019-07-03 ENCOUNTER — Other Ambulatory Visit: Payer: Self-pay | Admitting: *Deleted

## 2019-07-03 NOTE — Patient Outreach (Signed)
Member assessed for potential [facility] Care Management needs as a benefit of  [place] Medicare.  Member is currently receiving rehab therapy at [facility] SN[facility].  Collaboration with Mobile after her telephonic IDT meeting with facility. [facility] RN reports member making poor progress with therapy, per facility. Unclear of dc plans at this time. Home with wife prior. May need palliative care at dc.  Writer will continue to follow for disposition plans, progression, and for potential [facility] Care Management services.  Marthenia Rolling, MSN-Ed, RN,BSN [place] Acute Care Coordinator [phone] [facility]) [phone]  (Toll free office)

## 2019-07-09 DIAGNOSIS — F060 Psychotic disorder with hallucinations due to known physiological condition: Secondary | ICD-10-CM | POA: Diagnosis not present

## 2019-07-09 DIAGNOSIS — F329 Major depressive disorder, single episode, unspecified: Secondary | ICD-10-CM | POA: Diagnosis not present

## 2019-07-09 DIAGNOSIS — L89152 Pressure ulcer of sacral region, stage 2: Secondary | ICD-10-CM | POA: Diagnosis not present

## 2019-07-09 DIAGNOSIS — G20 Parkinson's disease: Secondary | ICD-10-CM | POA: Diagnosis not present

## 2019-07-13 DIAGNOSIS — R32 Unspecified urinary incontinence: Secondary | ICD-10-CM | POA: Diagnosis not present

## 2019-07-13 DIAGNOSIS — F329 Major depressive disorder, single episode, unspecified: Secondary | ICD-10-CM | POA: Diagnosis not present

## 2019-07-13 DIAGNOSIS — L89152 Pressure ulcer of sacral region, stage 2: Secondary | ICD-10-CM | POA: Diagnosis not present

## 2019-07-13 DIAGNOSIS — K5909 Other constipation: Secondary | ICD-10-CM | POA: Diagnosis not present

## 2019-07-13 DIAGNOSIS — F060 Psychotic disorder with hallucinations due to known physiological condition: Secondary | ICD-10-CM | POA: Diagnosis not present

## 2019-07-13 DIAGNOSIS — G20 Parkinson's disease: Secondary | ICD-10-CM | POA: Diagnosis not present

## 2019-07-17 DIAGNOSIS — J189 Pneumonia, unspecified organism: Secondary | ICD-10-CM | POA: Diagnosis not present

## 2019-07-17 DIAGNOSIS — F329 Major depressive disorder, single episode, unspecified: Secondary | ICD-10-CM | POA: Diagnosis not present

## 2019-07-17 DIAGNOSIS — N390 Urinary tract infection, site not specified: Secondary | ICD-10-CM | POA: Diagnosis not present

## 2019-07-17 DIAGNOSIS — G20 Parkinson's disease: Secondary | ICD-10-CM | POA: Diagnosis not present

## 2019-07-18 ENCOUNTER — Telehealth: Payer: Self-pay | Admitting: *Deleted

## 2019-07-18 NOTE — Telephone Encounter (Signed)
Rec'd msg on Patient [name] [name] stating pt has been discharge 07/18/19 @ 11:00 AM  from [facility] calling pt to follow=up and make follow-up appt if need too. There was no answer LMOM RTC.Marland KitchenJohny Chess

## 2019-07-19 NOTE — Telephone Encounter (Signed)
Called pt concerning discharge from [facility] on yesterday. Spoke w/wife completed TCM call below.[name] [name]  Transition Care Management Follow-up Telephone Call   Date discharged? 07/18/19   How have you been since you were released from the hospital? Wife states he is doing ok.. still trying to get his strength   Do you understand why you were in the hospital? YES   Do you understand the discharge instructions? YES   Where were you discharged to? Home   Items Reviewed:  Medications reviewed: YES, per wife no changes  Allergies reviewed: YES  Dietary changes reviewed: YES, heart healthy            Referrals reviewed: YES, [facility]:   Activities of Daily Living (ADLs):   She states he are independent in the following: feeding, continence, grooming, toileting and dressing States he require assistance with the following: ambulation and bathing and hygiene due to not having his strength. She is requesting a virtual visit. She states she is not capable of bringing him into the office.    Any transportation issues/concerns?: NO   Any patient concerns? NO   Confirmed importance and date/time of follow-up visits scheduled YES, virtual 07/29/19  Provider Appointment booked with Dr. Sharlet Salina  Confirmed with patient if condition begins to worsen call PCP or go to the ER.  Patient was given the office number and encouraged to call back with question or concerns.  : YES

## 2019-07-20 DIAGNOSIS — Z8701 Personal history of pneumonia (recurrent): Secondary | ICD-10-CM | POA: Diagnosis not present

## 2019-07-20 DIAGNOSIS — F060 Psychotic disorder with hallucinations due to known physiological condition: Secondary | ICD-10-CM | POA: Diagnosis not present

## 2019-07-20 DIAGNOSIS — R32 Unspecified urinary incontinence: Secondary | ICD-10-CM | POA: Diagnosis not present

## 2019-07-20 DIAGNOSIS — E669 Obesity, unspecified: Secondary | ICD-10-CM | POA: Diagnosis not present

## 2019-07-20 DIAGNOSIS — F329 Major depressive disorder, single episode, unspecified: Secondary | ICD-10-CM | POA: Diagnosis not present

## 2019-07-20 DIAGNOSIS — Z87440 Personal history of urinary (tract) infections: Secondary | ICD-10-CM | POA: Diagnosis not present

## 2019-07-20 DIAGNOSIS — G20 Parkinson's disease: Secondary | ICD-10-CM | POA: Diagnosis not present

## 2019-07-20 DIAGNOSIS — L89322 Pressure ulcer of left buttock, stage 2: Secondary | ICD-10-CM | POA: Diagnosis not present

## 2019-07-20 DIAGNOSIS — Z4800 Encounter for change or removal of nonsurgical wound dressing: Secondary | ICD-10-CM | POA: Diagnosis not present

## 2019-07-20 DIAGNOSIS — K5909 Other constipation: Secondary | ICD-10-CM | POA: Diagnosis not present

## 2019-07-20 DIAGNOSIS — L89312 Pressure ulcer of right buttock, stage 2: Secondary | ICD-10-CM | POA: Diagnosis not present

## 2019-07-20 DIAGNOSIS — Z6835 Body mass index (BMI) 35.0-35.9, adult: Secondary | ICD-10-CM | POA: Diagnosis not present

## 2019-07-22 DIAGNOSIS — L89312 Pressure ulcer of right buttock, stage 2: Secondary | ICD-10-CM | POA: Diagnosis not present

## 2019-07-22 DIAGNOSIS — F060 Psychotic disorder with hallucinations due to known physiological condition: Secondary | ICD-10-CM | POA: Diagnosis not present

## 2019-07-22 DIAGNOSIS — L89322 Pressure ulcer of left buttock, stage 2: Secondary | ICD-10-CM | POA: Diagnosis not present

## 2019-07-22 DIAGNOSIS — G20 Parkinson's disease: Secondary | ICD-10-CM | POA: Diagnosis not present

## 2019-07-22 DIAGNOSIS — R32 Unspecified urinary incontinence: Secondary | ICD-10-CM | POA: Diagnosis not present

## 2019-07-22 DIAGNOSIS — F329 Major depressive disorder, single episode, unspecified: Secondary | ICD-10-CM | POA: Diagnosis not present

## 2019-07-24 ENCOUNTER — Other Ambulatory Visit: Payer: Self-pay | Admitting: *Deleted

## 2019-07-24 ENCOUNTER — Telehealth: Payer: Self-pay | Admitting: Internal Medicine

## 2019-07-24 DIAGNOSIS — L89322 Pressure ulcer of left buttock, stage 2: Secondary | ICD-10-CM | POA: Diagnosis not present

## 2019-07-24 DIAGNOSIS — F329 Major depressive disorder, single episode, unspecified: Secondary | ICD-10-CM | POA: Diagnosis not present

## 2019-07-24 DIAGNOSIS — R32 Unspecified urinary incontinence: Secondary | ICD-10-CM | POA: Diagnosis not present

## 2019-07-24 DIAGNOSIS — G20 Parkinson's disease: Secondary | ICD-10-CM

## 2019-07-24 DIAGNOSIS — F060 Psychotic disorder with hallucinations due to known physiological condition: Secondary | ICD-10-CM | POA: Diagnosis not present

## 2019-07-24 DIAGNOSIS — L89312 Pressure ulcer of right buttock, stage 2: Secondary | ICD-10-CM | POA: Diagnosis not present

## 2019-07-24 NOTE — Telephone Encounter (Signed)
Fine

## 2019-07-24 NOTE — Patient Outreach (Signed)
Member assessed for potential [facility] Care Management needs as a benefit of [place] Medicare.  Verified in Patient [name] [name] that member discharged from [facility] SNF on 07/18/19.  Will make referral to [facility] for follow up.   [name] returned home with wife. He has [facility].   Member has a history of UTI, Parkinson's, major depression disorder, UTI, pneumonia.   Marthenia Rolling, MSN-Ed, RN,BSN [place] Acute Care Coordinator [phone] [facility]) [phone]  (Toll free office)

## 2019-07-24 NOTE — Telephone Encounter (Signed)
Please advise 

## 2019-07-24 NOTE — Telephone Encounter (Signed)
Verbals given  

## 2019-07-24 NOTE — Telephone Encounter (Signed)
Home Health Verbal Orders - Caller/Agency: [facility] Callback Number: [phone] Requesting OT/PT/Skilled Nursing/Social Work/Speech Therapy: PT  Frequency: 2x for 6wks, 1x for 2wks

## 2019-07-26 ENCOUNTER — Telehealth: Payer: Self-pay | Admitting: Internal Medicine

## 2019-07-26 DIAGNOSIS — R32 Unspecified urinary incontinence: Secondary | ICD-10-CM | POA: Diagnosis not present

## 2019-07-26 DIAGNOSIS — F329 Major depressive disorder, single episode, unspecified: Secondary | ICD-10-CM | POA: Diagnosis not present

## 2019-07-26 DIAGNOSIS — F060 Psychotic disorder with hallucinations due to known physiological condition: Secondary | ICD-10-CM | POA: Diagnosis not present

## 2019-07-26 DIAGNOSIS — L89312 Pressure ulcer of right buttock, stage 2: Secondary | ICD-10-CM | POA: Diagnosis not present

## 2019-07-26 DIAGNOSIS — G20 Parkinson's disease: Secondary | ICD-10-CM | POA: Diagnosis not present

## 2019-07-26 DIAGNOSIS — L89322 Pressure ulcer of left buttock, stage 2: Secondary | ICD-10-CM | POA: Diagnosis not present

## 2019-07-26 NOTE — Telephone Encounter (Signed)
Home Health Verbal Orders - Caller/Agency: [facility] Callback Number: [phone] Requesting OT/PT/Skilled Nursing/Social Work/Speech Therapy: Home Health OT Frequency: 1x 3w  2w 2x

## 2019-07-26 NOTE — Telephone Encounter (Signed)
Verbals given  

## 2019-07-26 NOTE — Telephone Encounter (Signed)
Fine

## 2019-07-29 ENCOUNTER — Encounter: Payer: Self-pay | Admitting: Internal Medicine

## 2019-07-29 ENCOUNTER — Ambulatory Visit (INDEPENDENT_AMBULATORY_CARE_PROVIDER_SITE_OTHER): Payer: Medicare Other | Admitting: Internal Medicine

## 2019-07-29 DIAGNOSIS — R5382 Chronic fatigue, unspecified: Secondary | ICD-10-CM | POA: Diagnosis not present

## 2019-07-29 DIAGNOSIS — N3281 Overactive bladder: Secondary | ICD-10-CM

## 2019-07-29 DIAGNOSIS — F329 Major depressive disorder, single episode, unspecified: Secondary | ICD-10-CM | POA: Diagnosis not present

## 2019-07-29 DIAGNOSIS — L89322 Pressure ulcer of left buttock, stage 2: Secondary | ICD-10-CM | POA: Diagnosis not present

## 2019-07-29 DIAGNOSIS — F332 Major depressive disorder, recurrent severe without psychotic features: Secondary | ICD-10-CM | POA: Diagnosis not present

## 2019-07-29 DIAGNOSIS — G20 Parkinson's disease: Secondary | ICD-10-CM | POA: Diagnosis not present

## 2019-07-29 DIAGNOSIS — R32 Unspecified urinary incontinence: Secondary | ICD-10-CM | POA: Diagnosis not present

## 2019-07-29 DIAGNOSIS — N390 Urinary tract infection, site not specified: Secondary | ICD-10-CM | POA: Diagnosis not present

## 2019-07-29 DIAGNOSIS — F060 Psychotic disorder with hallucinations due to known physiological condition: Secondary | ICD-10-CM | POA: Diagnosis not present

## 2019-07-29 DIAGNOSIS — L89312 Pressure ulcer of right buttock, stage 2: Secondary | ICD-10-CM | POA: Diagnosis not present

## 2019-07-29 NOTE — Progress Notes (Signed)
Virtual Visit via Video Note  I connected with [name] [name] on 07/29/19 at  2:00 PM EDT by a video enabled telemedicine application and verified that I am speaking with the correct person using two identifiers.  The patient and the provider were at separate locations throughout the entire encounter.   I discussed the limitations of evaluation and management by telemedicine and the availability of in person appointments. The patient expressed understanding and agreed to proceed. The patient and the provider were the only parties present for the visit unless noted in HPI below.  History of Present Illness: The patient is a 68 y.o. man with visit for hospital follow up (discharge from hospital for UTI and high fevers, then went to rehab for strengthening, finished all antibiotics in rehab/hospital). Was constipated at rehab and since discharge last week has gone to the bathroom several times. His wife has been giving him prune juice and fruits. She is present for visit and helps to provide history. Denies chest pains. Denies SOB. Denies nausea or vomiting. Using walker for ambulation and is walking more. Feels strong. Doing PT/OT at home. Overall it is stable and doing much better.   Observations/Objective: Appearance: normal, breathing appears normal, casual grooming, abdomen does not appear distended, throat normal, memory stable, mental status is A and O times 3  Assessment and Plan: See problem oriented charting  Follow Up Instructions: keep meds the same, finish PT/OT, no labs today  I discussed the assessment and treatment plan with the patient. The patient was provided an opportunity to ask questions and all were answered. The patient agreed with the plan and demonstrated an understanding of the instructions.   The patient was advised to call back or seek an in-person evaluation if the symptoms worsen or if the condition fails to improve as anticipated.  [name], MD

## 2019-07-30 NOTE — Assessment & Plan Note (Signed)
Strength still poor and working with PT/OT currently.

## 2019-07-30 NOTE — Assessment & Plan Note (Signed)
This is now resolved.

## 2019-07-30 NOTE — Assessment & Plan Note (Signed)
Still following with psych and overall stable.

## 2019-07-30 NOTE — Assessment & Plan Note (Signed)
Due to some urinary retention since leaving rehab facility he is no longer taking toviaz.

## 2019-07-31 ENCOUNTER — Telehealth: Payer: Self-pay | Admitting: Internal Medicine

## 2019-07-31 ENCOUNTER — Other Ambulatory Visit: Payer: Self-pay | Admitting: *Deleted

## 2019-07-31 ENCOUNTER — Other Ambulatory Visit: Payer: Self-pay

## 2019-07-31 DIAGNOSIS — F060 Psychotic disorder with hallucinations due to known physiological condition: Secondary | ICD-10-CM | POA: Diagnosis not present

## 2019-07-31 DIAGNOSIS — L89322 Pressure ulcer of left buttock, stage 2: Secondary | ICD-10-CM | POA: Diagnosis not present

## 2019-07-31 DIAGNOSIS — L89312 Pressure ulcer of right buttock, stage 2: Secondary | ICD-10-CM | POA: Diagnosis not present

## 2019-07-31 DIAGNOSIS — G20 Parkinson's disease: Secondary | ICD-10-CM | POA: Diagnosis not present

## 2019-07-31 DIAGNOSIS — R32 Unspecified urinary incontinence: Secondary | ICD-10-CM | POA: Diagnosis not present

## 2019-07-31 DIAGNOSIS — F329 Major depressive disorder, single episode, unspecified: Secondary | ICD-10-CM | POA: Diagnosis not present

## 2019-07-31 NOTE — Patient Outreach (Signed)
Telephone outreach post SNF stay.  Called spoke with [name] briefly and she asked if I would call  Back later today.  Spoke with [name] on speaker phone. Introduced myself and [facility] and reminded them that they had previously talked with Marthenia Rolling, RN, about [facility].  Pt has been home about a week now from [name]. He is cared for at home by his wife. He is getting home health nursing, PT and OT.  [name] reports that he is doing very well. He is out of the medication fog so to speak and is much better on the olanzapine. He no longer is having tremors or "freezing up."  Our main concerns will be for him to regain mobility and avoid falls.  I will call back in a week. They have my name and number for any incidental needs that may come up.  [name] [name]. [name] Neither, MSN, [facility] Gerontological Nurse Practitioner [facility] Care Management [phone]

## 2019-07-31 NOTE — Telephone Encounter (Signed)
Home Health Verbal Orders - Caller/Agency: [facility] Number: [phone] Requesting OT/PT/Skilled Nursing/Social Work/Speech Therapy: Request to move Social Work Eval to next Monday  Frequency:

## 2019-08-01 ENCOUNTER — Encounter: Payer: Self-pay | Admitting: *Deleted

## 2019-08-01 NOTE — Telephone Encounter (Signed)
Verbals given  

## 2019-08-01 NOTE — Telephone Encounter (Signed)
Fine

## 2019-08-02 DIAGNOSIS — F329 Major depressive disorder, single episode, unspecified: Secondary | ICD-10-CM | POA: Diagnosis not present

## 2019-08-02 DIAGNOSIS — L89312 Pressure ulcer of right buttock, stage 2: Secondary | ICD-10-CM | POA: Diagnosis not present

## 2019-08-02 DIAGNOSIS — L89322 Pressure ulcer of left buttock, stage 2: Secondary | ICD-10-CM | POA: Diagnosis not present

## 2019-08-02 DIAGNOSIS — R32 Unspecified urinary incontinence: Secondary | ICD-10-CM | POA: Diagnosis not present

## 2019-08-02 DIAGNOSIS — F060 Psychotic disorder with hallucinations due to known physiological condition: Secondary | ICD-10-CM | POA: Diagnosis not present

## 2019-08-02 DIAGNOSIS — G20 Parkinson's disease: Secondary | ICD-10-CM | POA: Diagnosis not present

## 2019-08-05 DIAGNOSIS — F060 Psychotic disorder with hallucinations due to known physiological condition: Secondary | ICD-10-CM | POA: Diagnosis not present

## 2019-08-05 DIAGNOSIS — G20 Parkinson's disease: Secondary | ICD-10-CM | POA: Diagnosis not present

## 2019-08-05 DIAGNOSIS — F329 Major depressive disorder, single episode, unspecified: Secondary | ICD-10-CM | POA: Diagnosis not present

## 2019-08-05 DIAGNOSIS — L89312 Pressure ulcer of right buttock, stage 2: Secondary | ICD-10-CM | POA: Diagnosis not present

## 2019-08-05 DIAGNOSIS — R32 Unspecified urinary incontinence: Secondary | ICD-10-CM | POA: Diagnosis not present

## 2019-08-05 DIAGNOSIS — L89322 Pressure ulcer of left buttock, stage 2: Secondary | ICD-10-CM | POA: Diagnosis not present

## 2019-08-06 ENCOUNTER — Ambulatory Visit: Payer: Self-pay | Admitting: *Deleted

## 2019-08-07 DIAGNOSIS — F329 Major depressive disorder, single episode, unspecified: Secondary | ICD-10-CM | POA: Diagnosis not present

## 2019-08-07 DIAGNOSIS — L89312 Pressure ulcer of right buttock, stage 2: Secondary | ICD-10-CM | POA: Diagnosis not present

## 2019-08-07 DIAGNOSIS — L89322 Pressure ulcer of left buttock, stage 2: Secondary | ICD-10-CM | POA: Diagnosis not present

## 2019-08-07 DIAGNOSIS — F060 Psychotic disorder with hallucinations due to known physiological condition: Secondary | ICD-10-CM | POA: Diagnosis not present

## 2019-08-07 DIAGNOSIS — R32 Unspecified urinary incontinence: Secondary | ICD-10-CM | POA: Diagnosis not present

## 2019-08-07 DIAGNOSIS — G20 Parkinson's disease: Secondary | ICD-10-CM | POA: Diagnosis not present

## 2019-08-09 DIAGNOSIS — R32 Unspecified urinary incontinence: Secondary | ICD-10-CM | POA: Diagnosis not present

## 2019-08-09 DIAGNOSIS — L89322 Pressure ulcer of left buttock, stage 2: Secondary | ICD-10-CM | POA: Diagnosis not present

## 2019-08-09 DIAGNOSIS — F329 Major depressive disorder, single episode, unspecified: Secondary | ICD-10-CM | POA: Diagnosis not present

## 2019-08-09 DIAGNOSIS — G20 Parkinson's disease: Secondary | ICD-10-CM | POA: Diagnosis not present

## 2019-08-09 DIAGNOSIS — L89312 Pressure ulcer of right buttock, stage 2: Secondary | ICD-10-CM | POA: Diagnosis not present

## 2019-08-09 DIAGNOSIS — F060 Psychotic disorder with hallucinations due to known physiological condition: Secondary | ICD-10-CM | POA: Diagnosis not present

## 2019-08-13 DIAGNOSIS — G20 Parkinson's disease: Secondary | ICD-10-CM | POA: Diagnosis not present

## 2019-08-13 DIAGNOSIS — F060 Psychotic disorder with hallucinations due to known physiological condition: Secondary | ICD-10-CM | POA: Diagnosis not present

## 2019-08-13 DIAGNOSIS — F329 Major depressive disorder, single episode, unspecified: Secondary | ICD-10-CM | POA: Diagnosis not present

## 2019-08-13 DIAGNOSIS — L89322 Pressure ulcer of left buttock, stage 2: Secondary | ICD-10-CM | POA: Diagnosis not present

## 2019-08-13 DIAGNOSIS — R32 Unspecified urinary incontinence: Secondary | ICD-10-CM | POA: Diagnosis not present

## 2019-08-13 DIAGNOSIS — L89312 Pressure ulcer of right buttock, stage 2: Secondary | ICD-10-CM | POA: Diagnosis not present

## 2019-08-14 DIAGNOSIS — G20 Parkinson's disease: Secondary | ICD-10-CM | POA: Diagnosis not present

## 2019-08-14 DIAGNOSIS — L89312 Pressure ulcer of right buttock, stage 2: Secondary | ICD-10-CM | POA: Diagnosis not present

## 2019-08-14 DIAGNOSIS — R32 Unspecified urinary incontinence: Secondary | ICD-10-CM | POA: Diagnosis not present

## 2019-08-14 DIAGNOSIS — F060 Psychotic disorder with hallucinations due to known physiological condition: Secondary | ICD-10-CM | POA: Diagnosis not present

## 2019-08-14 DIAGNOSIS — L89322 Pressure ulcer of left buttock, stage 2: Secondary | ICD-10-CM | POA: Diagnosis not present

## 2019-08-14 DIAGNOSIS — F329 Major depressive disorder, single episode, unspecified: Secondary | ICD-10-CM | POA: Diagnosis not present

## 2019-08-16 ENCOUNTER — Telehealth: Payer: Self-pay | Admitting: Internal Medicine

## 2019-08-16 DIAGNOSIS — L89312 Pressure ulcer of right buttock, stage 2: Secondary | ICD-10-CM | POA: Diagnosis not present

## 2019-08-16 DIAGNOSIS — F329 Major depressive disorder, single episode, unspecified: Secondary | ICD-10-CM | POA: Diagnosis not present

## 2019-08-16 DIAGNOSIS — F060 Psychotic disorder with hallucinations due to known physiological condition: Secondary | ICD-10-CM | POA: Diagnosis not present

## 2019-08-16 DIAGNOSIS — R32 Unspecified urinary incontinence: Secondary | ICD-10-CM | POA: Diagnosis not present

## 2019-08-16 DIAGNOSIS — L89322 Pressure ulcer of left buttock, stage 2: Secondary | ICD-10-CM | POA: Diagnosis not present

## 2019-08-16 DIAGNOSIS — G20 Parkinson's disease: Secondary | ICD-10-CM | POA: Diagnosis not present

## 2019-08-16 NOTE — Telephone Encounter (Signed)
Verbal orders given to [facility] for below.

## 2019-08-16 NOTE — Telephone Encounter (Signed)
OT with [facility] health calling in to request a VO so that the nurse can collect a UA      CB: [phone] -Ailene Ravel

## 2019-08-19 DIAGNOSIS — R32 Unspecified urinary incontinence: Secondary | ICD-10-CM | POA: Diagnosis not present

## 2019-08-19 DIAGNOSIS — Z6835 Body mass index (BMI) 35.0-35.9, adult: Secondary | ICD-10-CM | POA: Diagnosis not present

## 2019-08-19 DIAGNOSIS — L89312 Pressure ulcer of right buttock, stage 2: Secondary | ICD-10-CM | POA: Diagnosis not present

## 2019-08-19 DIAGNOSIS — G20 Parkinson's disease: Secondary | ICD-10-CM | POA: Diagnosis not present

## 2019-08-19 DIAGNOSIS — E669 Obesity, unspecified: Secondary | ICD-10-CM | POA: Diagnosis not present

## 2019-08-19 DIAGNOSIS — Z87440 Personal history of urinary (tract) infections: Secondary | ICD-10-CM | POA: Diagnosis not present

## 2019-08-19 DIAGNOSIS — Z4800 Encounter for change or removal of nonsurgical wound dressing: Secondary | ICD-10-CM | POA: Diagnosis not present

## 2019-08-19 DIAGNOSIS — F329 Major depressive disorder, single episode, unspecified: Secondary | ICD-10-CM | POA: Diagnosis not present

## 2019-08-19 DIAGNOSIS — L89322 Pressure ulcer of left buttock, stage 2: Secondary | ICD-10-CM | POA: Diagnosis not present

## 2019-08-19 DIAGNOSIS — Z8701 Personal history of pneumonia (recurrent): Secondary | ICD-10-CM | POA: Diagnosis not present

## 2019-08-19 DIAGNOSIS — F060 Psychotic disorder with hallucinations due to known physiological condition: Secondary | ICD-10-CM | POA: Diagnosis not present

## 2019-08-19 DIAGNOSIS — K5909 Other constipation: Secondary | ICD-10-CM | POA: Diagnosis not present

## 2019-08-21 ENCOUNTER — Telehealth: Payer: Self-pay | Admitting: Internal Medicine

## 2019-08-21 DIAGNOSIS — F060 Psychotic disorder with hallucinations due to known physiological condition: Secondary | ICD-10-CM | POA: Diagnosis not present

## 2019-08-21 DIAGNOSIS — R32 Unspecified urinary incontinence: Secondary | ICD-10-CM | POA: Diagnosis not present

## 2019-08-21 DIAGNOSIS — F329 Major depressive disorder, single episode, unspecified: Secondary | ICD-10-CM | POA: Diagnosis not present

## 2019-08-21 DIAGNOSIS — G20 Parkinson's disease: Secondary | ICD-10-CM | POA: Diagnosis not present

## 2019-08-21 DIAGNOSIS — L89312 Pressure ulcer of right buttock, stage 2: Secondary | ICD-10-CM | POA: Diagnosis not present

## 2019-08-21 DIAGNOSIS — L89322 Pressure ulcer of left buttock, stage 2: Secondary | ICD-10-CM | POA: Diagnosis not present

## 2019-08-21 NOTE — Telephone Encounter (Signed)
Fine

## 2019-08-21 NOTE — Telephone Encounter (Signed)
Verbals given  

## 2019-08-21 NOTE — Telephone Encounter (Signed)
Home Health Verbal Orders - Caller/Agency: Ailene Ravel with [facility] Number: [phone] Requesting OT/PT/Skilled Nursing/Social Work/Speech Therapy: OT Frequency: move visit to 11/23 week instead

## 2019-08-22 ENCOUNTER — Telehealth: Payer: Self-pay | Admitting: Internal Medicine

## 2019-08-22 ENCOUNTER — Encounter: Payer: Self-pay | Admitting: Internal Medicine

## 2019-08-22 DIAGNOSIS — G20 Parkinson's disease: Secondary | ICD-10-CM | POA: Diagnosis not present

## 2019-08-22 DIAGNOSIS — L89322 Pressure ulcer of left buttock, stage 2: Secondary | ICD-10-CM | POA: Diagnosis not present

## 2019-08-22 DIAGNOSIS — F329 Major depressive disorder, single episode, unspecified: Secondary | ICD-10-CM | POA: Diagnosis not present

## 2019-08-22 DIAGNOSIS — F060 Psychotic disorder with hallucinations due to known physiological condition: Secondary | ICD-10-CM | POA: Diagnosis not present

## 2019-08-22 DIAGNOSIS — R32 Unspecified urinary incontinence: Secondary | ICD-10-CM | POA: Diagnosis not present

## 2019-08-22 DIAGNOSIS — L89312 Pressure ulcer of right buttock, stage 2: Secondary | ICD-10-CM | POA: Diagnosis not present

## 2019-08-22 NOTE — Telephone Encounter (Signed)
Fine

## 2019-08-22 NOTE — Telephone Encounter (Signed)
Verbals given  

## 2019-08-22 NOTE — Telephone Encounter (Signed)
Copied from [place] [id number]. Topic: Quick Communication - Home Health Verbal Orders >> Aug 22, 2019  1:57 PM Carolyn Stare wrote: Angela Nevin with [facility] Number [phone]  Requesting OT VERBAL orders to move visit   Frequency 11 20 2020 visit to 12 2 2020

## 2019-08-23 DIAGNOSIS — F329 Major depressive disorder, single episode, unspecified: Secondary | ICD-10-CM | POA: Diagnosis not present

## 2019-08-23 DIAGNOSIS — R32 Unspecified urinary incontinence: Secondary | ICD-10-CM | POA: Diagnosis not present

## 2019-08-23 DIAGNOSIS — L89322 Pressure ulcer of left buttock, stage 2: Secondary | ICD-10-CM | POA: Diagnosis not present

## 2019-08-23 DIAGNOSIS — G20 Parkinson's disease: Secondary | ICD-10-CM | POA: Diagnosis not present

## 2019-08-23 DIAGNOSIS — L89312 Pressure ulcer of right buttock, stage 2: Secondary | ICD-10-CM | POA: Diagnosis not present

## 2019-08-23 DIAGNOSIS — F060 Psychotic disorder with hallucinations due to known physiological condition: Secondary | ICD-10-CM | POA: Diagnosis not present

## 2019-08-23 NOTE — Telephone Encounter (Signed)
Called wife and informed that I had called [facility] and found out that the order was for a routine urine culture and those cannot be stat as they have to grow the bacteria to see what is sensitive or resistant. I informed her that I had asked them to add on a UA to see if we could get those results today at least so if possibly we could treat for uti if bacteria shows up while we wait on the culture results. I have given [facility] side A fax number and have been checking that periodically today for any results. Wife stated understanding just was very worried when she saw the cloudy milky film that came out when patient voided. Wife is just terrified that patient will have to end back up in the ED for another UTI and does not want that to happen again. Told patient that I would be in touch as soon as any type of results come in. Wife stated understanding and was appreciative.

## 2019-08-23 NOTE — Telephone Encounter (Signed)
I am not sure what the question is here. Is patient have urinary issues?

## 2019-08-23 NOTE — Telephone Encounter (Signed)
I do not know who ordered UA as there is not a note in the chart from a provider to order. When ordering this can be done stat but there is not a way usually to change the speed of orders afterwards. Additionally I do not see a culture ordered to let us know if there is an infection if there are any resistances. We can do this today at our lab if they want to do that.

## 2019-08-23 NOTE — Telephone Encounter (Signed)
Copied from [place] [id number]. Topic: Quick Communication - Home Health Verbal Orders >> Aug 23, 2019  9:59 AM Alanda Slim E wrote: Pts wife called and stated she thinks they need the urine results for the Pt sooner than Tuesday which is what she was advised would be the time frame by the home nurse.  Pts wife said about 30 minutes ago she seen a cloudy substance in the Pts urine /  please advise asap cb# [phone]

## 2019-08-23 NOTE — Telephone Encounter (Signed)
Called wife states [facility] got a clean catch urine yesterday but was told it would take till Tuesday for results.   Wife states that there is filmy cloudy substance coming out with the urine and urine odor "smells like sulfer."  Wife is wanting to know if there is any way to facilitate the results at [facility] to get results so he can get on an abx to help if there is an infection. States she really feels like he has one and is worried to wait so long for results. Unable to bring patient here for urine test that's why last week they asked for orders to have a nurse come out and get a sample.

## 2019-08-26 ENCOUNTER — Other Ambulatory Visit: Payer: Self-pay | Admitting: Internal Medicine

## 2019-08-26 ENCOUNTER — Telehealth: Payer: Self-pay | Admitting: Internal Medicine

## 2019-08-26 DIAGNOSIS — G20 Parkinson's disease: Secondary | ICD-10-CM | POA: Diagnosis not present

## 2019-08-26 DIAGNOSIS — L89312 Pressure ulcer of right buttock, stage 2: Secondary | ICD-10-CM | POA: Diagnosis not present

## 2019-08-26 DIAGNOSIS — F060 Psychotic disorder with hallucinations due to known physiological condition: Secondary | ICD-10-CM | POA: Diagnosis not present

## 2019-08-26 DIAGNOSIS — R32 Unspecified urinary incontinence: Secondary | ICD-10-CM | POA: Diagnosis not present

## 2019-08-26 DIAGNOSIS — L89322 Pressure ulcer of left buttock, stage 2: Secondary | ICD-10-CM | POA: Diagnosis not present

## 2019-08-26 DIAGNOSIS — F329 Major depressive disorder, single episode, unspecified: Secondary | ICD-10-CM | POA: Diagnosis not present

## 2019-08-26 MED ORDER — SULFAMETHOXAZOLE-TRIMETHOPRIM 800-160 MG PO TABS: 1.0000 | ORAL_TABLET | Freq: Two times a day (BID) | ORAL | 0 refills | Status: DC

## 2019-08-26 NOTE — Telephone Encounter (Signed)
Patient's wife calling to check the status of urine test. She was told the test was not in yet. She is very upset and stated this was urgent. She would like a call back ASAP please

## 2019-08-26 NOTE — Telephone Encounter (Signed)
Bactrim 7 day course sent in for UTI. Results back from home health. Given the two infections recently I would like them to discontinue the external catheter as this could be causing the infections. I would like them to use a bedside commode or urinal for urination. If he is having incontinence he will need visit to evaluate.

## 2019-08-26 NOTE — Telephone Encounter (Signed)
Taken care of in previous encounter

## 2019-08-26 NOTE — Telephone Encounter (Signed)
Patient informed of MD response states only uses the catheter at night and cleans every time she changes it. If she does not use it at night then she would be cleaning laundry all the time and would be up all night. Is hoping that the last ABx was just not strong enough and hope that this one will be good but will watch closely to see if maybe it is the catheter. Will assess after this round and let us know.

## 2019-08-28 DIAGNOSIS — G20 Parkinson's disease: Secondary | ICD-10-CM | POA: Diagnosis not present

## 2019-08-28 DIAGNOSIS — L89312 Pressure ulcer of right buttock, stage 2: Secondary | ICD-10-CM | POA: Diagnosis not present

## 2019-08-28 DIAGNOSIS — R32 Unspecified urinary incontinence: Secondary | ICD-10-CM | POA: Diagnosis not present

## 2019-08-28 DIAGNOSIS — F060 Psychotic disorder with hallucinations due to known physiological condition: Secondary | ICD-10-CM | POA: Diagnosis not present

## 2019-08-28 DIAGNOSIS — F329 Major depressive disorder, single episode, unspecified: Secondary | ICD-10-CM | POA: Diagnosis not present

## 2019-08-28 DIAGNOSIS — L89322 Pressure ulcer of left buttock, stage 2: Secondary | ICD-10-CM | POA: Diagnosis not present

## 2019-09-02 ENCOUNTER — Telehealth: Payer: Self-pay | Admitting: Internal Medicine

## 2019-09-02 ENCOUNTER — Telehealth: Payer: Self-pay | Admitting: Neurology

## 2019-09-02 NOTE — Telephone Encounter (Signed)
Pt is wanting a call back about about UTI med is finished but neuro dr has been consulted and wants another tested ordered from [facility] as suspect not gone [phone]

## 2019-09-02 NOTE — Telephone Encounter (Signed)
Should have office visit with urine done then.

## 2019-09-02 NOTE — Telephone Encounter (Signed)
Wife is aware, and she stated she is "at the end of her rope" and she does not know what else to do. Sending to Judson Roch so she can reach out to the patients wife whom is supplying the care giving for the patient. Please reference notes from PCP Dr Sharlet Salina.

## 2019-09-02 NOTE — Telephone Encounter (Signed)
1.  UTI will cause PD symptoms to be worse.  Tx is to treat the UTI, not to change the PD meds.  It can take a while for the PD symptoms to get better even if the UTI is treated 2.  Dosing for levodopa is not weight dependent 3.  Pt has an appt in 2 weeks and we can talk then.  Make them aware that this will likely be video due to [facility] new policy

## 2019-09-02 NOTE — Telephone Encounter (Signed)
Please advise on below  

## 2019-09-02 NOTE — Telephone Encounter (Signed)
Can you make patient an office visit please. Needs to be discussed why patient keeps having UTI's. NEEDS to have a visit

## 2019-09-02 NOTE — Telephone Encounter (Signed)
Wife is calling in about antibiotic, he just finished the 2nd set of them yesterday- another UTI- she said that he weighs over 300 pounds and is wanting to increase the carbidopa levodopa- she said he is so stiff she can't move him or help him walk. She is asking for some help. Wife said that she is needing a call back as soon as possible. Thanks!

## 2019-09-03 ENCOUNTER — Other Ambulatory Visit: Payer: Self-pay | Admitting: *Deleted

## 2019-09-03 ENCOUNTER — Encounter (HOSPITAL_COMMUNITY): Payer: Self-pay | Admitting: Psychiatry

## 2019-09-03 ENCOUNTER — Ambulatory Visit (INDEPENDENT_AMBULATORY_CARE_PROVIDER_SITE_OTHER): Payer: Medicare Other | Admitting: Psychiatry

## 2019-09-03 ENCOUNTER — Other Ambulatory Visit: Payer: Self-pay

## 2019-09-03 DIAGNOSIS — F411 Generalized anxiety disorder: Secondary | ICD-10-CM

## 2019-09-03 DIAGNOSIS — F332 Major depressive disorder, recurrent severe without psychotic features: Secondary | ICD-10-CM | POA: Diagnosis not present

## 2019-09-03 MED ORDER — CITALOPRAM HYDROBROMIDE 40 MG PO TABS: 40.0000 mg | ORAL_TABLET | Freq: Every day | ORAL | 0 refills | Status: DC

## 2019-09-03 MED ORDER — BUPROPION HCL ER (XL) 150 MG PO TB24: 150.0000 mg | ORAL_TABLET | Freq: Every day | ORAL | 0 refills | Status: DC

## 2019-09-03 MED ORDER — BUPROPION HCL ER (XL) 300 MG PO TB24: 300.0000 mg | ORAL_TABLET | ORAL | 0 refills | Status: DC

## 2019-09-03 MED ORDER — OLANZAPINE 2.5 MG PO TABS: 2.5000 mg | ORAL_TABLET | Freq: Every day | ORAL | 0 refills | Status: DC

## 2019-09-03 NOTE — Patient Outreach (Signed)
Telephone outreach to pt wife today to discuss pt and wife's current caregiving services and other needs.  Talked with Mrs. Mandley at length today. She admits to much frustration and burn out. She has some outside caregiving assistance but not substantial enough to relieve her caregiving burden. (Aid 2 X a week for 3 hours a day paid for by a state grant) 2 nights caregiver paid for by pt children. Their resources are limited without dipping into the IRA that pt has.   Discussed options: 1) Respite for a few days - not interested because of COVID. 2) LTC placment - not interested because of COVID, feels care at home is much better and desirable. 3)Spend down the IRA to pay for additional caregiving - resistant to spend, attorney is advising them.  Encouraged to continue current routine and to ask for help from others when she is feeling especially fatigued.  She is very concerned with whether his recent UTI is resolved and wants a subsequent UA to verify. She does report that his urine is clear and the odor is gone.  Outpatient Encounter Medications as of 09/03/2019  Medication Sig Note  . B Complex-C (B-COMPLEX WITH VITAMIN C) tablet Take 1 tablet by mouth daily.   Marland Kitchen buPROPion (WELLBUTRIN XL) 150 MG 24 hr tablet Take 1 tablet (150 mg total) by mouth daily. 06/19/2019: Takes at 9 PM  . buPROPion (WELLBUTRIN XL) 300 MG 24 hr tablet Take 1 tablet (300 mg total) by mouth every morning.   . carbidopa-levodopa (SINEMET IR) 25-100 MG tablet Take 1-2 tablets by mouth See admin instructions. Take 2 tabs at 7am, Take 2 tabs at 11 am, and Take 1 tabs at 4 pm. (Patient taking differently: Take 1-2 tablets by mouth See admin instructions. Take 2 tabs at 7am, Take 2 tabs at 11 am, and Take 2 tabs at 4 pm.)   . citalopram (CELEXA) 40 MG tablet Take 0.5 tablets (20 mg total) by mouth daily. Dose adjusted for safety. Please discuss with prescribing physician.   Marland Kitchen [name] (ZYPREXA) 2.5 MG tablet Take 1 tablet  (2.5 mg total) by mouth at bedtime.   Ernestine Conrad 3-6-9 Fatty Acids (OMEGA 3-6-9 PO) Take 1 capsule by mouth daily.    Marland Kitchen sulfamethoxazole-trimethoprim (BACTRIM DS) 800-160 MG tablet Take 1 tablet by mouth 2 (two) times daily.   . TURMERIC CURCUMIN PO Take 1 capsule by mouth daily.     No facility-administered encounter medications on file as of 09/03/2019.    Fall Risk  05/28/2019 05/20/2019 02/04/2019 01/04/2019 08/16/2018  Falls in the past year? 0 _0 0  Number falls in past yr: 0 _1 0  Injury with Fall? 0 1 0 1 0  Risk Factor Category  - - - - -  Risk for fall due to : - - Impaired mobility;Impaired balance/gait;Mental status change - -  Follow up - - Education provided Falls evaluation completed Falls evaluation completed   Depression screen [facility] 2/9 09/03/2019  Decreased Interest 1  Down, Depressed, Hopeless 1  PHQ - 2 Score 2  Altered sleeping 0  Tired, decreased energy 1  Change in appetite 0  Feeling bad or failure about yourself  0  Trouble concentrating 0  Moving slowly or fidgety/restless 1  Suicidal thoughts 0  PHQ-9 Score 4  Difficult doing work/chores Not difficult at all  Some recent data might be hidden   [facility] CM Care Plan Problem One     Most Recent Value  Care Plan Problem One  Mobility  Role Documenting the Problem One  Care Management [place] for Problem One  Active  THN Long Term Goal   Pt will regain his ability to move about the house independently over the next 60 days.  THN Long Term Goal Start Date  07/31/19  Interventions for Problem One Long Term Goal  Encouraged to continue to maintain this ability and do daily exercises recommended by PT.  THN CM Short Term Goal #1   Pt will continue to participate with PT and progress through his goals over the next 30 days.  THN CM Short Term Goal #1 Start Date  07/31/19  THN CM Short Term Goal #1 Met Date  09/03/19    [facility] CM Care Plan Problem Two     Most Recent Value  Care Plan Problem Two  High  Risk for Falls  Role Documenting the Problem Two  Care Management Coordinator  Care Plan for Problem Two  Active  THN CM Short Term Goal #1   Pt will not fall and sustain a serious injury requiring ED visit over the next 30 days.  THN CM Short Term Goal #1 Start Date  07/31/19  [facility] CM Short Term Goal #1 Met Date   09/03/19  Interventions for Short Term Goal #2   Encouraged continuation of current safety precautions.    THN CM Care Plan Problem Three     Most Recent Value  Care Plan Problem Three  No Advanced Directives  Role Documenting the Problem Three  Care Management Coordinator  Care Plan for Problem Three  Active  THN CM Short Term Goal #1   Pt will complete a HCPOA and Living Will and MOST form over the next 30 days.  THN CM Short Term Goal #1 Start Date  07/31/19  Interventions for Short Term Goal #1  Will complete goals of care and MOST form on next call.      Will call her at least once a month which is what she agreed to. Encouraged her to call me if she needs advice or just to talk.  Will ask Dr. Sharlet Salina to order a UA for her reassurance.  [name]. Myrtie Neither, MSN, [facility] Gerontological Nurse Practitioner [facility] Care Management [phone]

## 2019-09-03 NOTE — Progress Notes (Signed)
Virtual Visit via Telephone Note  I connected with [name] [name] on 09/03/19 at  3:00 PM EST by telephone and verified that I am speaking with the correct person using two identifiers.   I discussed the limitations, risks, security and privacy concerns of performing an evaluation and management service by telephone and the availability of in person appointments. I also discussed with the patient that there may be a patient responsible charge related to this service. The patient expressed understanding and agreed to proceed.   History of Present Illness: Patient was evaluated through phone session.  Most of the information was obtained through electronic medical record and his wife.  Patient has Parkinson and recently he was admitted because of infection and upon discharge completed rehabilitation.  Patient's wife was upset because he was not getting full dose of Celexa at rehab.  Patient told he was feeling sad and depressed as he was not taking full dose of Celexa.  Now he is back on 40 mg Celexa, Wellbutrin XL 450 mg and olanzapine 2.5 mg at bedtime.  His wife is also concerned because she feels he may have a UTI because his urine color is changed but patient has no fever or any distress.  She is afraid to take him to the ER because of Covid.  I also spoke to the patient who feels fine.  He is taking his medication and does not feel depressed, irritable, angry.  He admitted chronic hallucination which is usually visual as he is seeing things.  He has tremors but his appetite is okay.  He is afraid to cut down his medication because he feels it is working very well.  [name] is helping his sleep.  He has no suicidal thoughts, paranoia or any self abusive behavior.  Patient lives with his wife who is very supportive.      Past Psychiatric History:Reviewed. H/Odepression and anxiety.Noh/osuicidal attempt, mania,inpatient psychiatric treatment.  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 Seeingin this office since September 2009. He has seen multiple psychiatrists in the past.  Recent Results (from the past 2160 hour(s))  Novel Coronavirus, NAA ([facility])     Status: None   Collection Time: 06/19/19 12:00 AM   Specimen: Oropharyngeal(OP) collection in vial transport medium   OROPHARYNGEA  TESTING  Result Value Ref Range   SARS-CoV-2, NAA Not Detected Not Detected    Comment: This nucleic acid amplification test was developed and its performance characteristics determined by [employer]. Nucleic acid amplification tests include PCR and TMA. This test has not been FDA cleared or approved. This test has been authorized by FDA under an Emergency Use Authorization (EUA). This test is only authorized for the duration of time the declaration that circumstances exist justifying the authorization of the emergency use of in vitro diagnostic tests for detection of SARS-CoV-2 virus and/or diagnosis of COVID-19 infection under section 564(b)(1) of the Act, 21 U.S.C. [id number]) (1), unless the authorization is terminated or revoked sooner. When diagnostic testing is negative, the possibility of a false negative result should be considered in the context of a patient's recent exposures and the presence of clinical signs and symptoms consistent with COVID-19. An individual without symptoms of COVID-19 and who is not shedding SARS-CoV-2 virus would  expect to have a negative (not detected) result in this assay.   CBC with Differential/Platelet     Status: Abnormal   Collection Time: 06/19/19  8:04 PM  Result Value Ref Range   WBC 10.3 4.0 - 10.5 K/uL   RBC 3.48 (L) 4.22 - 5.81 MIL/uL   Hemoglobin 10.7 (L) 13.0 - 17.0 g/dL   HCT 32.3 (L) 39.0 - 52.0 %   MCV 92.8 80.0 - 100.0 fL   MCH 30.7 26.0 - 34.0 pg   MCHC 33.1 30.0 - 36.0 g/dL   RDW  13.1 11.5 - 15.5 %   Platelets 253 150 - 400 K/uL   nRBC 0.0 0.0 - 0.2 %   Neutrophils Relative % 89 %   Neutro Abs 9.2 (H) 1.7 - 7.7 K/uL   Lymphocytes Relative 3 %   Lymphs Abs 0.3 (L) 0.7 - 4.0 K/uL   Monocytes Relative 7 %   Monocytes Absolute 0.7 0.1 - 1.0 K/uL   Eosinophils Relative 0 %   Eosinophils Absolute 0.0 0.0 - 0.5 K/uL   Basophils Relative 0 %   Basophils Absolute 0.0 0.0 - 0.1 K/uL   Immature Granulocytes 1 %   Abs Immature Granulocytes 0.05 0.00 - 0.07 K/uL    Comment: Performed at [facility], [place] [address], [place], [place] [address]  Comprehensive metabolic panel     Status: Abnormal   Collection Time: 06/19/19  8:04 PM  Result Value Ref Range   Sodium 132 (L) 135 - 145 mmol/L   Potassium 3.9 3.5 - 5.1 mmol/L   Chloride 99 98 - 111 mmol/L   CO2 22 22 - 32 mmol/L   Glucose, Bld 125 (H) 70 - 99 mg/dL   BUN 17 8 - 23 mg/dL   Creatinine, Ser 1.20 0.61 - 1.24 mg/dL   Calcium 8.4 (L) 8.9 - 10.3 mg/dL   Total Protein 6.7 6.5 - 8.1 g/dL   Albumin 3.1 (L) 3.5 - 5.0 g/dL   AST 52 (H) 15 - 41 U/L   ALT 10 0 - 44 U/L   Alkaline Phosphatase 83 38 - 126 U/L   Total Bilirubin 0.8 0.3 - 1.2 mg/dL   GFR calc non Af Amer >60 >60 mL/min   GFR calc Af Amer >60 >60 mL/min   Anion gap 11 5 - 15    Comment: Performed at [facility], [place] [address], [place], [place] [address]  Urinalysis, Routine w reflex microscopic     Status: Abnormal   Collection Time: 06/19/19  8:04 PM  Result Value Ref Range   Color, Urine AMBER (A) YELLOW    Comment: BIOCHEMICALS MAY BE AFFECTED BY COLOR   APPearance CLOUDY (A) CLEAR   Specific Gravity, Urine 1.014 1.005 - 1.030   pH 5.0 5.0 - 8.0   Glucose, UA NEGATIVE NEGATIVE mg/dL   Hgb urine dipstick MODERATE (A) NEGATIVE   Bilirubin Urine NEGATIVE NEGATIVE   Ketones, ur 5 (A) NEGATIVE mg/dL   Protein, ur 30 (A) NEGATIVE mg/dL   Nitrite POSITIVE (A) NEGATIVE   Leukocytes,Ua LARGE (A) NEGATIVE   RBC /  HPF 6-10 0 - 5 RBC/hpf   WBC, UA >50 (H) 0 - 5 WBC/hpf   Bacteria, UA MANY (A) NONE SEEN   WBC Clumps PRESENT    Mucus PRESENT     Comment: Performed at [facility], [place] [address] [place], [place] [address]  Lactic acid, plasma     Status: None   Collection Time: 06/19/19  8:04 PM  Result Value Ref Range   Lactic Acid, Venous 1.9 0.5 - 1.9 mmol/L    Comment: Performed at [facility], [place] [address] [place], [place] [address]  Blood culture (routine x 2)     Status: Abnormal   Collection Time: 06/19/19  8:04 PM   Specimen: BLOOD  Result Value Ref Range   Specimen Description      BLOOD BLOOD LEFT FOREARM Performed at [place] [address], [place], [place] [address]    Special Requests      BOTTLES DRAWN AEROBIC AND ANAEROBIC Blood Culture adequate volume Performed at [place] [address] [place], [place] [address]    Culture  Setup Time      GRAM POSITIVE COCCI AEROBIC BOTTLE ONLY CRITICAL RESULT CALLED TO, READ BACK BY AND VERIFIED WITH: PHRMD B GREEN @ 0410 06/22/19 BY  S GEZAHEGN    Culture (A)     STAPHYLOCOCCUS SPECIES (COAGULASE NEGATIVE) THE SIGNIFICANCE OF ISOLATING THIS ORGANISM FROM A SINGLE SET OF BLOOD CULTURES WHEN MULTIPLE SETS ARE DRAWN IS UNCERTAIN. PLEASE NOTIFY THE MICROBIOLOGY DEPARTMENT WITHIN ONE WEEK IF SPECIATION AND SENSITIVITIES ARE REQUIRED. Performed at [facility], [place] [address] [place], [place] [address]    Report Status 06/23/2019 FINAL   Blood Culture ID Panel (Reflexed)     Status: Abnormal   Collection Time: 06/19/19  8:04 PM  Result Value Ref Range   Enterococcus species NOT DETECTED NOT DETECTED   Listeria monocytogenes NOT DETECTED NOT DETECTED   Staphylococcus species DETECTED (A) NOT DETECTED    Comment: Methicillin (oxacillin) susceptible coagulase negative staphylococcus. Possible blood culture contaminant (unless isolated from more  than one blood culture draw or clinical case suggests pathogenicity). No antibiotic treatment is indicated for blood  culture contaminants. CRITICAL RESULT CALLED TO, READ BACK BY AND VERIFIED WITH: PHRMD B GREEN @ 0410 06/22/19 BY  S GEZAHEGN    Staphylococcus aureus (BCID) NOT DETECTED NOT DETECTED   Methicillin resistance NOT DETECTED NOT DETECTED   Streptococcus species NOT DETECTED NOT DETECTED   Streptococcus agalactiae NOT DETECTED NOT DETECTED   Streptococcus pneumoniae NOT DETECTED NOT DETECTED   Streptococcus pyogenes NOT DETECTED NOT DETECTED   Acinetobacter baumannii NOT DETECTED NOT DETECTED   Enterobacteriaceae species NOT DETECTED NOT DETECTED   Enterobacter cloacae complex NOT DETECTED NOT DETECTED   Escherichia coli NOT DETECTED NOT DETECTED   Klebsiella oxytoca NOT DETECTED NOT DETECTED   Klebsiella pneumoniae NOT DETECTED NOT DETECTED   Proteus species NOT DETECTED NOT DETECTED   Serratia marcescens NOT DETECTED NOT DETECTED   Haemophilus influenzae NOT DETECTED NOT DETECTED   Neisseria meningitidis NOT DETECTED NOT DETECTED   Pseudomonas aeruginosa NOT DETECTED NOT DETECTED   Candida albicans NOT DETECTED NOT DETECTED   Candida glabrata NOT DETECTED NOT DETECTED   Candida krusei NOT DETECTED NOT DETECTED   Candida parapsilosis NOT DETECTED NOT DETECTED   Candida tropicalis NOT DETECTED NOT DETECTED    Comment: Performed at [facility], [address] [place], [place] [address]  Blood culture (routine x 2)     Status: None   Collection Time: 06/19/19  8:09 PM   Specimen: BLOOD RIGHT HAND  Result Value Ref Range   Specimen Description      BLOOD RIGHT HAND Performed at [facility] [address], [place], [place] [address]    Special Requests      BOTTLES DRAWN AEROBIC AND ANAEROBIC Blood Culture results may not be optimal due to an inadequate volume of blood received in culture bottles Performed at [facility],  [place] [address] [place], [place] [address]    Culture      NO GROWTH 5 DAYS Performed at [facility], [place] [address], [place], [place] [address]    Report Status 06/24/2019 FINAL   Urine Culture     Status: Abnormal   Collection Time: 06/19/19 10:14 PM   Specimen: Urine  Result Value Ref Range   Specimen Description      Urine Performed at [place] [address], [place], [place] [address]    Special Requests      NONE Performed at [facility], [place] [address] [place], [place] [address]    Culture >=100,000 COLONIES/mL ESCHERICHIA COLI (A)    Report Status 06/22/2019 FINAL    Organism ID, Bacteria ESCHERICHIA COLI (A)       Susceptibility   Escherichia coli - MIC*    AMPICILLIN <=2 SENSITIVE Sensitive     CEFAZOLIN <=4 SENSITIVE Sensitive     CEFEPIME <=1 SENSITIVE Sensitive     CEFTAZIDIME <=1 SENSITIVE Sensitive     CEFTRIAXONE <=1 SENSITIVE Sensitive     CIPROFLOXACIN <=0.25 SENSITIVE Sensitive     GENTAMICIN <=1 SENSITIVE Sensitive     IMIPENEM <=0.25 SENSITIVE Sensitive     TRIMETH/SULFA <=20 SENSITIVE Sensitive     AMPICILLIN/SULBACTAM <=2 SENSITIVE Sensitive     PIP/TAZO <=4 SENSITIVE Sensitive     Extended ESBL NEGATIVE Sensitive     * >=100,000 COLONIES/mL ESCHERICHIA COLI  SARS Coronavirus 2 [facility] order, Performed in [facility] hospital lab) Nasopharyngeal Nasopharyngeal Swab     Status: None   Collection Time: 06/20/19  2:05 AM   Specimen: Nasopharyngeal Swab  Result Value Ref Range   SARS Coronavirus 2 NEGATIVE NEGATIVE    Comment: (NOTE) If result is NEGATIVE SARS-CoV-2 target nucleic acids are NOT DETECTED. The SARS-CoV-2 RNA is generally detectable in upper and lower  respiratory specimens during the acute phase of infection. The lowest  concentration of SARS-CoV-2 viral copies this assay can detect is 250  copies / mL. A negative result does not preclude SARS-CoV-2 infection  and should not be  used as the sole basis for treatment or other  patient management decisions.  A negative result may occur with  improper specimen collection / handling, submission of specimen other  than nasopharyngeal swab, presence of viral mutation(s) within the  areas targeted by this assay, and inadequate number of viral copies  (<250 copies / mL). A negative result must be combined with clinical  observations, patient history, and epidemiological information. If result is POSITIVE SARS-CoV-2 target nucleic acids are DETECTED. The SARS-CoV-2 RNA is generally detectable in upper and lower  respiratory specimens dur ing the acute phase of infection.  Positive  results are indicative of active infection with SARS-CoV-2.  Clinical  correlation with patient history and other diagnostic information is  necessary to determine patient infection status.  Positive results do  not rule out bacterial infection or co-infection with other viruses. If result is PRESUMPTIVE POSTIVE SARS-CoV-2 nucleic acids MAY BE PRESENT.   A presumptive positive result was obtained on the submitted specimen  and confirmed on repeat testing.  While 2019 novel coronavirus  (SARS-CoV-2) nucleic acids may be present in the submitted sample  additional confirmatory testing may be necessary for epidemiological  and / or clinical management purposes  to differentiate between  SARS-CoV-2 and other Sarbecovirus currently known to infect humans.  If clinically indicated additional testing with an alternate test  methodology [phone]) is advised. The SARS-CoV-2 RNA is generally  detectable in upper and lower respiratory sp ecimens during the acute  phase of infection. The expected result is Negative. Fact Sheet for Patients:  StrictlyIdeas.no Fact Sheet for Healthcare Providers: BankingDealers.co.za This test is not yet approved or cleared by the [place] FDA and has been authorized  for detection and/or diagnosis of SARS-CoV-2 by FDA under an Emergency Use Authorization (EUA).  This EUA will remain in effect (meaning this test can be used) for the duration of the COVID-19 declaration under Section 564(b)(1) of the Act, 21 U.S.C. section 360bbb-3(b)(1), unless the authorization is terminated or revoked sooner. Performed at [facility], [place] [address], [place], [place] [address]   HIV antibody (Routine Testing)     Status: None   Collection Time: 06/20/19  2:05 AM  Result Value Ref Range   HIV Screen 4th Generation wRfx Non Reactive Non Reactive    Comment: (NOTE) Performed At: [facility] [place], [place] [id number] Rush Farmer MD [id number]   CBC     Status: Abnormal   Collection Time: 06/20/19  6:09 AM  Result Value Ref Range   WBC 8.1 4.0 - 10.5 K/uL   RBC 3.46 (L) 4.22 - 5.81 MIL/uL   Hemoglobin 10.5 (L) 13.0 - 17.0 g/dL   HCT 32.5 (L) 39.0 - 52.0 %   MCV 93.9 80.0 - 100.0 fL   MCH 30.3 26.0 - 34.0 pg   MCHC 32.3 30.0 - 36.0 g/dL   RDW 13.0 11.5 - 15.5 %   Platelets 237 150 - 400 K/uL   nRBC 0.0 0.0 - 0.2 %    Comment: Performed at [facility], [place] [address], [place], [place] [id number]  Basic metabolic panel     Status: Abnormal   Collection Time: 06/20/19  6:09 AM  Result Value Ref Range   Sodium 135 135 - 145 mmol/L   Potassium 3.4 (L) 3.5 - 5.1 mmol/L   Chloride 105 98 - 111 mmol/L   CO2 19 (L) 22 - 32 mmol/L   Glucose, Bld 105 (H) 70 - 99 mg/dL   BUN 13 8 - 23 mg/dL   Creatinine, Ser 1.07 0.61 - 1.24 mg/dL   Calcium 8.4 (L) 8.9 - 10.3 mg/dL   GFR calc non Af Amer >60 >60 mL/min   GFR calc Af Amer >60 >60 mL/min   Anion gap 11 5 - 15    Comment: Performed at [facility], [place] [address], [place], [place] [address]  CBC     Status: Abnormal   Collection Time: 06/21/19  4:54 AM  Result Value Ref Range   WBC 6.5 4.0 - 10.5 K/uL   RBC 3.40 (L) 4.22 -  5.81 MIL/uL   Hemoglobin 10.3 (L) 13.0 - 17.0 g/dL   HCT 31.7 (L) 39.0 - 52.0 %   MCV 93.2 80.0 - 100.0 fL   MCH 30.3 26.0 - 34.0 pg   MCHC 32.5 30.0 - 36.0 g/dL   RDW 13.1 11.5 - 15.5 %   Platelets 245 150 - 400 K/uL   nRBC 0.0 0.0 - 0.2 %    Comment: Performed at [facility], [place] [address] [place],  [id number]  Basic metabolic panel     Status: Abnormal   Collection Time: 06/21/19  4:54 AM  Result Value Ref Range   Sodium 139 135 - 145 mmol/L   Potassium 3.5 3.5 - 5.1  mmol/L   Chloride 109 98 - 111 mmol/L   CO2 23 22 - 32 mmol/L   Glucose, Bld 105 (H) 70 - 99 mg/dL   BUN 13 8 - 23 mg/dL   Creatinine, Ser 0.99 0.61 - 1.24 mg/dL   Calcium 8.6 (L) 8.9 - 10.3 mg/dL   GFR calc non Af Amer >60 >60 mL/min   GFR calc Af Amer >60 >60 mL/min   Anion gap 7 5 - 15    Comment: Performed at [facility], [place] [address] [place], [place] [address]  Magnesium     Status: None   Collection Time: 06/21/19  4:54 AM  Result Value Ref Range   Magnesium 2.1 1.7 - 2.4 mg/dL    Comment: Performed at [facility], [place] [address] [place], [place] [id number]  Basic metabolic panel     Status: Abnormal   Collection Time: 06/22/19  4:33 AM  Result Value Ref Range   Sodium 141 135 - 145 mmol/L   Potassium 3.8 3.5 - 5.1 mmol/L   Chloride 107 98 - 111 mmol/L   CO2 24 22 - 32 mmol/L   Glucose, Bld 102 (H) 70 - 99 mg/dL   BUN 13 8 - 23 mg/dL   Creatinine, Ser 1.00 0.61 - 1.24 mg/dL   Calcium 8.6 (L) 8.9 - 10.3 mg/dL   GFR calc non Af Amer >60 >60 mL/min   GFR calc Af Amer >60 >60 mL/min   Anion gap 10 5 - 15    Comment: Performed at [facility], [place] [address] [place], [place] [address]  Magnesium     Status: None   Collection Time: 06/22/19  4:33 AM  Result Value Ref Range   Magnesium 2.2 1.7 - 2.4 mg/dL    Comment: Performed at [facility], [place] [address] [place], [place] [address]  CBC      Status: Abnormal   Collection Time: 06/22/19  4:33 AM  Result Value Ref Range   WBC 5.3 4.0 - 10.5 K/uL   RBC 3.34 (L) 4.22 - 5.81 MIL/uL   Hemoglobin 10.1 (L) 13.0 - 17.0 g/dL   HCT 31.3 (L) 39.0 - 52.0 %   MCV 93.7 80.0 - 100.0 fL   MCH 30.2 26.0 - 34.0 pg   MCHC 32.3 30.0 - 36.0 g/dL   RDW 13.2 11.5 - 15.5 %   Platelets 281 150 - 400 K/uL   nRBC 0.0 0.0 - 0.2 %    Comment: Performed at [facility], [place] [address], [place], [place] [address]       Psychiatric Specialty Exam: Physical Exam  ROS  There were no vitals taken for this visit.There is no height or weight on file to calculate BMI.  General Appearance: NA  Eye Contact:  NA  Speech:  Slow  Volume:  Decreased  Mood:  Dysphoric  Affect:  NA  Thought Process:  Descriptions of Associations: Intact  Orientation:  Full (Time, Place, and Person)  Thought Content:  Hallucinations: Visual, Rumination and poverty of thought content  Suicidal Thoughts:  No  Homicidal Thoughts:  No  Memory:  Immediate;   Fair Recent;   Fair Remote;   Fair  Judgement:  Good  Insight:  Good  Psychomotor Activity:  Decreased  Concentration:  Concentration: Fair and Attention Span: Fair  Recall:  [employer] of Knowledge:  Fair  Language:  Fair  Akathisia:  No  Handed:  Right  AIMS (if indicated):     Assets:  Desire for Improvement Housing Resilience Social Support  ADL's:  Intact  Cognition:  Impaired,  Mild  Sleep:   ok      Assessment and Plan: Major depressive disorder, recurrent.  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  Generalized anxiety disorder.  I reviewed blood work results.  Patient is back on Celexa 40 mg and he feeling it is working well.  He is also taking olanzapine 2.5 mg at bedtime, Wellbutrin 300 mg in the morning and 150 in the evening.  I encouraged that he should contact his PCP if he noticed any fever, UTI symptoms as wife noticed change in urine color but patient does not appear to  be in distress.  He is getting physical therapy.  I will continue his medication.  Discussed medication side effects and benefits.  Recommended to call us back if is any question or any concern.  Follow-up in 3 months.  Follow Up Instructions:    I discussed the assessment and treatment plan with the patient. The patient was provided an opportunity to ask questions and all were answered. The patient agreed with the plan and demonstrated an understanding of the instructions.   The patient was advised to call back or seek an in-person evaluation if the symptoms worsen or if the condition fails to improve as anticipated.  I provided 20 minutes of non-face-to-face time during this encounter.   Kathlee Nations, MD

## 2019-09-04 ENCOUNTER — Telehealth: Payer: Self-pay

## 2019-09-04 DIAGNOSIS — G20 Parkinson's disease: Secondary | ICD-10-CM | POA: Diagnosis not present

## 2019-09-04 DIAGNOSIS — L89312 Pressure ulcer of right buttock, stage 2: Secondary | ICD-10-CM | POA: Diagnosis not present

## 2019-09-04 DIAGNOSIS — F329 Major depressive disorder, single episode, unspecified: Secondary | ICD-10-CM | POA: Diagnosis not present

## 2019-09-04 DIAGNOSIS — F060 Psychotic disorder with hallucinations due to known physiological condition: Secondary | ICD-10-CM | POA: Diagnosis not present

## 2019-09-04 DIAGNOSIS — R32 Unspecified urinary incontinence: Secondary | ICD-10-CM | POA: Diagnosis not present

## 2019-09-04 DIAGNOSIS — L89322 Pressure ulcer of left buttock, stage 2: Secondary | ICD-10-CM | POA: Diagnosis not present

## 2019-09-04 NOTE — Telephone Encounter (Signed)
Please see prior phone note, he needs visit if having problems with UTI and they can make that.

## 2019-09-04 NOTE — Telephone Encounter (Signed)
Patient's wife informed of below. His wife states patient is not ambulatory due to worsening stiffness. She is requesting a order for his [name] [name], [name] [name] Pulse to obtain specimen this Friday when she is in the home. She insists she can not get patient in the office at this time.

## 2019-09-04 NOTE — Telephone Encounter (Signed)
If he is sick he needs to be seen somewhere so we can help him.

## 2019-09-04 NOTE — Telephone Encounter (Signed)
Copied from [place] [id number]. Topic: General - Other >> Sep 04, 2019  1:24 PM Rainey Pines A wrote: Ailene Ravel from [facility] is requesting verbal order Ot 1w1 effective next week. Patients wife stated patient is still having some hallucinations and thinks that maybe uti is still present. Ailene Ravel would like an order for a UA. Best contact number [phone]

## 2019-09-05 NOTE — Telephone Encounter (Signed)
Can you call patients wife to explain why he needs to be seen? Patient has had many UTI's back to back and this one has seemed to be lasting for a while. Dr. Sharlet [place] wants him to be seen so we can get to the underlying problem of why these keep happening instead of just doing UTI's and prescribing ABX

## 2019-09-05 NOTE — Telephone Encounter (Signed)
I have talked with patient's wife and explained reason for the need to have patient come into office with re-occurring problems---also, not standard practice to continue prescribing abx again and again --the need to get to the underlying problem is foremost before continuing with abx that may not be needed---patient's wife states husband is not ambulatory and she can't afford stretcher/transportation to get him here---the only thing she can do is call 911 to go to ER---I again explained that dr crawford will need to see the patient in person to discuss symptoms and problem solve why UTI's are re-occurring---patients wife said she would have to call back

## 2019-09-06 ENCOUNTER — Telehealth: Payer: Self-pay | Admitting: Internal Medicine

## 2019-09-06 DIAGNOSIS — F329 Major depressive disorder, single episode, unspecified: Secondary | ICD-10-CM | POA: Diagnosis not present

## 2019-09-06 DIAGNOSIS — G20 Parkinson's disease: Secondary | ICD-10-CM | POA: Diagnosis not present

## 2019-09-06 DIAGNOSIS — L89312 Pressure ulcer of right buttock, stage 2: Secondary | ICD-10-CM | POA: Diagnosis not present

## 2019-09-06 DIAGNOSIS — L89322 Pressure ulcer of left buttock, stage 2: Secondary | ICD-10-CM | POA: Diagnosis not present

## 2019-09-06 DIAGNOSIS — F060 Psychotic disorder with hallucinations due to known physiological condition: Secondary | ICD-10-CM | POA: Diagnosis not present

## 2019-09-06 DIAGNOSIS — R32 Unspecified urinary incontinence: Secondary | ICD-10-CM | POA: Diagnosis not present

## 2019-09-06 NOTE — Telephone Encounter (Signed)
Verbal orders given to Meredith. 

## 2019-09-06 NOTE — Telephone Encounter (Signed)
Fine to extend verbal orders

## 2019-09-06 NOTE — Telephone Encounter (Signed)
Merdith, from [facility], calling to check on the verbal orders to extend OT for 1wk x1 for the week of 09/09/2019. Please advise.     [phone]

## 2019-09-06 NOTE — Telephone Encounter (Signed)
Error

## 2019-09-09 DIAGNOSIS — R32 Unspecified urinary incontinence: Secondary | ICD-10-CM | POA: Diagnosis not present

## 2019-09-09 DIAGNOSIS — L89312 Pressure ulcer of right buttock, stage 2: Secondary | ICD-10-CM | POA: Diagnosis not present

## 2019-09-09 DIAGNOSIS — F329 Major depressive disorder, single episode, unspecified: Secondary | ICD-10-CM | POA: Diagnosis not present

## 2019-09-09 DIAGNOSIS — F060 Psychotic disorder with hallucinations due to known physiological condition: Secondary | ICD-10-CM | POA: Diagnosis not present

## 2019-09-09 DIAGNOSIS — G20 Parkinson's disease: Secondary | ICD-10-CM | POA: Diagnosis not present

## 2019-09-09 DIAGNOSIS — L89322 Pressure ulcer of left buttock, stage 2: Secondary | ICD-10-CM | POA: Diagnosis not present

## 2019-09-11 DIAGNOSIS — R32 Unspecified urinary incontinence: Secondary | ICD-10-CM | POA: Diagnosis not present

## 2019-09-11 DIAGNOSIS — G20 Parkinson's disease: Secondary | ICD-10-CM | POA: Diagnosis not present

## 2019-09-11 DIAGNOSIS — F329 Major depressive disorder, single episode, unspecified: Secondary | ICD-10-CM | POA: Diagnosis not present

## 2019-09-11 DIAGNOSIS — L89312 Pressure ulcer of right buttock, stage 2: Secondary | ICD-10-CM | POA: Diagnosis not present

## 2019-09-11 DIAGNOSIS — L89322 Pressure ulcer of left buttock, stage 2: Secondary | ICD-10-CM | POA: Diagnosis not present

## 2019-09-11 DIAGNOSIS — F060 Psychotic disorder with hallucinations due to known physiological condition: Secondary | ICD-10-CM | POA: Diagnosis not present

## 2019-09-11 NOTE — Telephone Encounter (Signed)
[facility] case management also seeing the patient and the following advice given:  Discussed options: 1) Respite for a few days - not interested because of COVID. 2) LTC placment - not interested because of COVID, feels care at home is much better and desirable. 3)Spend down the IRA to pay for additional caregiving - resistant to spend, attorney is advising them.  Looks like the are aware of resources, some of which they refuse.  Unfortunately, these are the only options at this point so they will need to decide what is best for them

## 2019-09-11 NOTE — Telephone Encounter (Signed)
They were receiving funds from [employer] for respite, they could reach out if more is needed but this is an ongong need that needs a long-term solution. The last I knew, they were receiving 6hr/wk of in-home care. I know that's not a lot, however wife does not want to pay out of pocket for additional. I have let her know about Elder Law planning and the option to look at how to qualify for Medicaid but she hasn't been interested in 1) paying for increased level of services or 2) learning to see how they may qualify for Medicaid or long-term care insurance, so Im really unsure how to help

## 2019-09-13 DIAGNOSIS — F329 Major depressive disorder, single episode, unspecified: Secondary | ICD-10-CM | POA: Diagnosis not present

## 2019-09-13 DIAGNOSIS — G20 Parkinson's disease: Secondary | ICD-10-CM | POA: Diagnosis not present

## 2019-09-13 DIAGNOSIS — L89312 Pressure ulcer of right buttock, stage 2: Secondary | ICD-10-CM | POA: Diagnosis not present

## 2019-09-13 DIAGNOSIS — L89322 Pressure ulcer of left buttock, stage 2: Secondary | ICD-10-CM | POA: Diagnosis not present

## 2019-09-13 DIAGNOSIS — F060 Psychotic disorder with hallucinations due to known physiological condition: Secondary | ICD-10-CM | POA: Diagnosis not present

## 2019-09-13 DIAGNOSIS — R32 Unspecified urinary incontinence: Secondary | ICD-10-CM | POA: Diagnosis not present

## 2019-09-13 NOTE — Addendum Note (Signed)
Addended by: [name] on: 09/13/2019 08:58 AM   Modules accepted: Orders

## 2019-09-13 NOTE — Telephone Encounter (Signed)
Can we call home health and authorize U/A and culture. This will be the last time this is done outside of a visit for his health and safety.

## 2019-09-13 NOTE — Telephone Encounter (Signed)
Verbals given  

## 2019-09-13 NOTE — Telephone Encounter (Signed)
Spoke with pt wife, Joycelyn Schmid. Informed HH orders placed for UA and culture. Per wife, [facility] nurse is scheduled for Monday, she will let me know if any problems.  Informed wife that based on results or continuing issues, a visit may be needed. She stated understanding, although reiterated her concerns about exposure to COVID>

## 2019-09-14 DIAGNOSIS — Z0000 Encounter for general adult medical examination without abnormal findings: Secondary | ICD-10-CM | POA: Diagnosis not present

## 2019-09-14 DIAGNOSIS — F329 Major depressive disorder, single episode, unspecified: Secondary | ICD-10-CM | POA: Diagnosis not present

## 2019-09-14 DIAGNOSIS — L89159 Pressure ulcer of sacral region, unspecified stage: Secondary | ICD-10-CM | POA: Diagnosis not present

## 2019-09-14 DIAGNOSIS — F4310 Post-traumatic stress disorder, unspecified: Secondary | ICD-10-CM | POA: Diagnosis not present

## 2019-09-14 DIAGNOSIS — M6281 Muscle weakness (generalized): Secondary | ICD-10-CM | POA: Diagnosis not present

## 2019-09-14 DIAGNOSIS — N390 Urinary tract infection, site not specified: Secondary | ICD-10-CM | POA: Diagnosis not present

## 2019-09-16 ENCOUNTER — Telehealth: Payer: Self-pay | Admitting: Internal Medicine

## 2019-09-16 DIAGNOSIS — L89322 Pressure ulcer of left buttock, stage 2: Secondary | ICD-10-CM | POA: Diagnosis not present

## 2019-09-16 DIAGNOSIS — F329 Major depressive disorder, single episode, unspecified: Secondary | ICD-10-CM | POA: Diagnosis not present

## 2019-09-16 DIAGNOSIS — G20 Parkinson's disease: Secondary | ICD-10-CM | POA: Diagnosis not present

## 2019-09-16 DIAGNOSIS — R32 Unspecified urinary incontinence: Secondary | ICD-10-CM | POA: Diagnosis not present

## 2019-09-16 DIAGNOSIS — F060 Psychotic disorder with hallucinations due to known physiological condition: Secondary | ICD-10-CM | POA: Diagnosis not present

## 2019-09-16 DIAGNOSIS — L89312 Pressure ulcer of right buttock, stage 2: Secondary | ICD-10-CM | POA: Diagnosis not present

## 2019-09-16 NOTE — Telephone Encounter (Signed)
Reita Chard with [facility] is calling to report that the patient is changing providers. CB- [phone]

## 2019-09-16 NOTE — Telephone Encounter (Signed)
fyi

## 2019-09-17 ENCOUNTER — Encounter: Payer: Self-pay | Admitting: Neurology

## 2019-09-17 NOTE — Progress Notes (Signed)
Virtual Visit via Video Note The purpose of this virtual visit is to provide medical care while limiting exposure to the novel coronavirus.    Consent was obtained for video visit:  Yes.   Answered questions that patient had about telehealth interaction:  Yes.   I discussed the limitations, risks, security and privacy concerns of performing an evaluation and management service by telemedicine. I also discussed with the patient that there may be a patient responsible charge related to this service. The patient expressed understanding and agreed to proceed.  Pt location: Home Physician Location: office Name of referring provider:  [name], * I connected with [name] [name] at patients initiation/request on 09/18/2019 at 11:15 AM EST by video enabled telemedicine application and verified that I am speaking with the correct person using two identifiers. Pt MRN:  [id number] Pt DOB:  [birth year] Video Participants:  [name]; wife supplements history   History of Present Illness:  Patient seen today in follow-up for parkinsonism.  He had an EEG at our last visit.  This did not demonstrate any epileptiform activity.  Medical records are reviewed since our last visit.  Patient was back in the hospital in September for sepsis due to urinary tract infection.  He was very weak and deconditioned.  He ended up going to [name] for rehab after that.  He did well with that.  His wife subsequently called me about 2 weeks ago stating that he had another urinary tract infection and got weak again.  She wanted to increase the levodopa.  I explained that this really does not make sense, as the source of the weakness was the urinary tract infection and not the parkinsonism.  Patient's wife was frustrated, mostly because she has been exhausted and taking care of the patient.  I did note [facility] case management has been in contact with the patient.  I have discussed options for respite care  and long-term placement, but noted that patient/family were not interested in those things.  He is still seeing psychiatry.  Last seen on December 1.  He is on olanzapine.  Wife states that she feels that this was really a "miracle drug" for him.  Current movement d/o meds: Carbidopa/levodopa 25/100, 2 tablets 3 times per day Ropinirole discontinued last visit    Current Outpatient Medications on File Prior to Visit  Medication Sig Dispense Refill  . B Complex-C (B-COMPLEX WITH VITAMIN C) tablet Take 1 tablet by mouth daily.    Marland Kitchen buPROPion (WELLBUTRIN XL) 150 MG 24 hr tablet Take 1 tablet (150 mg total) by mouth daily. 90 tablet 0  . buPROPion (WELLBUTRIN XL) 300 MG 24 hr tablet Take 1 tablet (300 mg total) by mouth every morning. 90 tablet 0  . carbidopa-levodopa (SINEMET IR) 25-100 MG tablet Take 1-2 tablets by mouth See admin instructions. Take 2 tabs at 7am, Take 2 tabs at 11 am, and Take 1 tabs at 4 pm. (Patient taking differently: 2 tablets 3 (three) times daily. Take 2 tabs at 7am, Take 2 tabs at 11 am, 2 tabs at 4 pm.) 450 tablet 1  . citalopram (CELEXA) 40 MG tablet Take 1 tablet (40 mg total) by mouth daily. Dose adjusted for safety. Please discuss with prescribing physician. 90 tablet 0  . [name] (ZYPREXA) 2.5 MG tablet Take 1 tablet (2.5 mg total) by mouth at bedtime. 90 tablet 0  . Omega 3-6-9 Fatty Acids (OMEGA 3-6-9 PO) Take 1 capsule by mouth daily.     Marland Kitchen  TURMERIC CURCUMIN PO Take 1 capsule by mouth daily.      No current facility-administered medications on file prior to visit.     Observations/Objective:   Vitals:   09/17/19 1501  BP: 120/70  Weight: 295 lb (133.8 kg)   GEN:  The patient appears stated age and is in NAD.  Neurological examination:  Orientation: The patient is alert and oriented x3. Cranial nerves: There is good facial symmetry. There is facial hypomimia.  The speech is fluent and clear. Soft palate rises symmetrically and there is no tongue  deviation. Hearing is intact to conversational tone. Motor: Strength is at least antigravity x 4.   Shoulder shrug is equal and symmetric.  There is no pronator drift.  Movement examination: Tone: unable Abnormal movements: None seen Coordination:  There is slow rapid alternating movements in the bilateral upper and lower extremity, but no true decremation.   Gait and Station: The patient pushes off of his couch to arise.  He is able to arise on his own.  He ambulates with his walker.  He is slow, but ambulates fairly well.  He is stable.      Assessment and Plan:   1. Parkinsonism -Continue carbidopa/levodopa 25/100, 2 tablets 3 times per day. 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  Unfortunately, the Zyprexa certainly can make parkinsonism worse.  We will need to monitor for that, but overall we have not seen his parkinsonism worsen.  His depression related psychosis definitely got better with the addition of olanzapine. 3. Memory loss -Patient has twice had neurocognitive testing, the last of which was in December 2017, and then had a shorter evaluation in the hospital in 12/2018. I have not seen any significant cognitive decline, although he has had intermittent decline when he ends up in the hospital. 4.  Multiple urinary tract infections with sepsis  -Discussed need to follow-up with urology.  He does have a urologist, Dr. Vikki Ports.  Encouraged patient/wife to follow-up with him.  Discussed that he may be a candidate for suppressive antibiotics, although that is really out of my field of expertise.  His wife is going to make an appointment with the urologist to discuss.  Follow Up Instructions:  5 months  -I discussed the assessment and treatment plan with the patient. The patient was provided an opportunity to ask questions and all were answered. The patient agreed with the plan and demonstrated an  understanding of the instructions.   The patient was advised to call back or seek an in-person evaluation if the symptoms worsen or if the condition fails to improve as anticipated.    Total Time spent in visit with the patient was:  25 min, of which more than 50% of the time was spent in counseling.   Pt understands and agrees with the plan of care outlined.     [name] Bogus, DO

## 2019-09-18 ENCOUNTER — Other Ambulatory Visit: Payer: Self-pay

## 2019-09-18 ENCOUNTER — Telehealth (INDEPENDENT_AMBULATORY_CARE_PROVIDER_SITE_OTHER): Payer: Medicare Other | Admitting: Neurology

## 2019-09-18 VITALS — BP 120/70 | Wt 295.0 lb

## 2019-09-18 DIAGNOSIS — F060 Psychotic disorder with hallucinations due to known physiological condition: Secondary | ICD-10-CM | POA: Diagnosis not present

## 2019-09-18 DIAGNOSIS — N39498 Other specified urinary incontinence: Secondary | ICD-10-CM | POA: Diagnosis not present

## 2019-09-18 DIAGNOSIS — F331 Major depressive disorder, recurrent, moderate: Secondary | ICD-10-CM | POA: Diagnosis not present

## 2019-09-18 DIAGNOSIS — Z87440 Personal history of urinary (tract) infections: Secondary | ICD-10-CM | POA: Diagnosis not present

## 2019-09-18 DIAGNOSIS — Z8701 Personal history of pneumonia (recurrent): Secondary | ICD-10-CM | POA: Diagnosis not present

## 2019-09-18 DIAGNOSIS — Z6835 Body mass index (BMI) 35.0-35.9, adult: Secondary | ICD-10-CM | POA: Diagnosis not present

## 2019-09-18 DIAGNOSIS — E669 Obesity, unspecified: Secondary | ICD-10-CM | POA: Diagnosis not present

## 2019-09-18 DIAGNOSIS — G20 Parkinson's disease: Secondary | ICD-10-CM

## 2019-09-18 DIAGNOSIS — F329 Major depressive disorder, single episode, unspecified: Secondary | ICD-10-CM | POA: Diagnosis not present

## 2019-09-18 DIAGNOSIS — R32 Unspecified urinary incontinence: Secondary | ICD-10-CM | POA: Diagnosis not present

## 2019-09-19 DIAGNOSIS — M6281 Muscle weakness (generalized): Secondary | ICD-10-CM | POA: Diagnosis not present

## 2019-09-19 DIAGNOSIS — F329 Major depressive disorder, single episode, unspecified: Secondary | ICD-10-CM | POA: Diagnosis not present

## 2019-09-19 DIAGNOSIS — F4310 Post-traumatic stress disorder, unspecified: Secondary | ICD-10-CM | POA: Diagnosis not present

## 2019-09-20 DIAGNOSIS — E669 Obesity, unspecified: Secondary | ICD-10-CM | POA: Diagnosis not present

## 2019-09-20 DIAGNOSIS — R32 Unspecified urinary incontinence: Secondary | ICD-10-CM | POA: Diagnosis not present

## 2019-09-20 DIAGNOSIS — F329 Major depressive disorder, single episode, unspecified: Secondary | ICD-10-CM | POA: Diagnosis not present

## 2019-09-20 DIAGNOSIS — G20 Parkinson's disease: Secondary | ICD-10-CM | POA: Diagnosis not present

## 2019-09-20 DIAGNOSIS — Z87440 Personal history of urinary (tract) infections: Secondary | ICD-10-CM | POA: Diagnosis not present

## 2019-09-20 DIAGNOSIS — F060 Psychotic disorder with hallucinations due to known physiological condition: Secondary | ICD-10-CM | POA: Diagnosis not present

## 2019-09-23 DIAGNOSIS — R32 Unspecified urinary incontinence: Secondary | ICD-10-CM | POA: Diagnosis not present

## 2019-09-23 DIAGNOSIS — Z87440 Personal history of urinary (tract) infections: Secondary | ICD-10-CM | POA: Diagnosis not present

## 2019-09-23 DIAGNOSIS — F329 Major depressive disorder, single episode, unspecified: Secondary | ICD-10-CM | POA: Diagnosis not present

## 2019-09-23 DIAGNOSIS — E669 Obesity, unspecified: Secondary | ICD-10-CM | POA: Diagnosis not present

## 2019-09-23 DIAGNOSIS — F060 Psychotic disorder with hallucinations due to known physiological condition: Secondary | ICD-10-CM | POA: Diagnosis not present

## 2019-09-23 DIAGNOSIS — G20 Parkinson's disease: Secondary | ICD-10-CM | POA: Diagnosis not present

## 2019-09-24 ENCOUNTER — Other Ambulatory Visit: Payer: Self-pay | Admitting: *Deleted

## 2019-09-24 NOTE — Patient Outreach (Signed)
Follow up telephone call for chronic care management.  Spoke to pt's wife, [name] [name]. She verifies that her husband is stable at this time. She has changed [name] [name] primary care provider to Dr. Verlin Fester who will make housecalls to pt.  Pt recently saw Dr. Carles Collet, who advised increasing pt's levadopa would not help his urinary tract sxs. He advised talking with his neurologist and that perhaps he would start him on a prophyllactic antibiotic. They will have a virtual visit today.  Attempted to discuss advanced directives with [name]. She stated very forcefully that she did not want to talk about the topic at all.   She reports the grant money has run out for his inhome assistance 2 times a week. Introduced the idea of [facility] program and she will call to find out if he is eligible.   I will call back in one month for a follow up. Reminded pt's wife that I am available to questions or problems that need to be resolved.  [name]. Myrtie Neither, MSN, [facility] Gerontological Nurse Practitioner [facility] Care Management [phone]

## 2019-09-25 DIAGNOSIS — R32 Unspecified urinary incontinence: Secondary | ICD-10-CM | POA: Diagnosis not present

## 2019-09-25 DIAGNOSIS — Z87440 Personal history of urinary (tract) infections: Secondary | ICD-10-CM | POA: Diagnosis not present

## 2019-09-25 DIAGNOSIS — E669 Obesity, unspecified: Secondary | ICD-10-CM | POA: Diagnosis not present

## 2019-09-25 DIAGNOSIS — F329 Major depressive disorder, single episode, unspecified: Secondary | ICD-10-CM | POA: Diagnosis not present

## 2019-09-25 DIAGNOSIS — F060 Psychotic disorder with hallucinations due to known physiological condition: Secondary | ICD-10-CM | POA: Diagnosis not present

## 2019-09-25 DIAGNOSIS — G20 Parkinson's disease: Secondary | ICD-10-CM | POA: Diagnosis not present

## 2019-09-26 DIAGNOSIS — F060 Psychotic disorder with hallucinations due to known physiological condition: Secondary | ICD-10-CM | POA: Diagnosis not present

## 2019-09-26 DIAGNOSIS — Z87440 Personal history of urinary (tract) infections: Secondary | ICD-10-CM | POA: Diagnosis not present

## 2019-09-26 DIAGNOSIS — F329 Major depressive disorder, single episode, unspecified: Secondary | ICD-10-CM | POA: Diagnosis not present

## 2019-09-26 DIAGNOSIS — R32 Unspecified urinary incontinence: Secondary | ICD-10-CM | POA: Diagnosis not present

## 2019-09-26 DIAGNOSIS — E669 Obesity, unspecified: Secondary | ICD-10-CM | POA: Diagnosis not present

## 2019-09-26 DIAGNOSIS — G20 Parkinson's disease: Secondary | ICD-10-CM | POA: Diagnosis not present

## 2019-09-30 DIAGNOSIS — Z87440 Personal history of urinary (tract) infections: Secondary | ICD-10-CM | POA: Diagnosis not present

## 2019-09-30 DIAGNOSIS — E669 Obesity, unspecified: Secondary | ICD-10-CM | POA: Diagnosis not present

## 2019-09-30 DIAGNOSIS — F060 Psychotic disorder with hallucinations due to known physiological condition: Secondary | ICD-10-CM | POA: Diagnosis not present

## 2019-09-30 DIAGNOSIS — F329 Major depressive disorder, single episode, unspecified: Secondary | ICD-10-CM | POA: Diagnosis not present

## 2019-09-30 DIAGNOSIS — G20 Parkinson's disease: Secondary | ICD-10-CM | POA: Diagnosis not present

## 2019-09-30 DIAGNOSIS — R32 Unspecified urinary incontinence: Secondary | ICD-10-CM | POA: Diagnosis not present

## 2019-10-02 DIAGNOSIS — Z87440 Personal history of urinary (tract) infections: Secondary | ICD-10-CM | POA: Diagnosis not present

## 2019-10-02 DIAGNOSIS — R32 Unspecified urinary incontinence: Secondary | ICD-10-CM | POA: Diagnosis not present

## 2019-10-02 DIAGNOSIS — F329 Major depressive disorder, single episode, unspecified: Secondary | ICD-10-CM | POA: Diagnosis not present

## 2019-10-02 DIAGNOSIS — G20 Parkinson's disease: Secondary | ICD-10-CM | POA: Diagnosis not present

## 2019-10-02 DIAGNOSIS — E669 Obesity, unspecified: Secondary | ICD-10-CM | POA: Diagnosis not present

## 2019-10-02 DIAGNOSIS — F060 Psychotic disorder with hallucinations due to known physiological condition: Secondary | ICD-10-CM | POA: Diagnosis not present

## 2019-10-03 DIAGNOSIS — Z87440 Personal history of urinary (tract) infections: Secondary | ICD-10-CM | POA: Diagnosis not present

## 2019-10-03 DIAGNOSIS — G20 Parkinson's disease: Secondary | ICD-10-CM | POA: Diagnosis not present

## 2019-10-03 DIAGNOSIS — F060 Psychotic disorder with hallucinations due to known physiological condition: Secondary | ICD-10-CM | POA: Diagnosis not present

## 2019-10-03 DIAGNOSIS — F329 Major depressive disorder, single episode, unspecified: Secondary | ICD-10-CM | POA: Diagnosis not present

## 2019-10-03 DIAGNOSIS — E669 Obesity, unspecified: Secondary | ICD-10-CM | POA: Diagnosis not present

## 2019-10-03 DIAGNOSIS — R32 Unspecified urinary incontinence: Secondary | ICD-10-CM | POA: Diagnosis not present

## 2019-10-09 DIAGNOSIS — F060 Psychotic disorder with hallucinations due to known physiological condition: Secondary | ICD-10-CM | POA: Diagnosis not present

## 2019-10-09 DIAGNOSIS — G20 Parkinson's disease: Secondary | ICD-10-CM | POA: Diagnosis not present

## 2019-10-09 DIAGNOSIS — F329 Major depressive disorder, single episode, unspecified: Secondary | ICD-10-CM | POA: Diagnosis not present

## 2019-10-09 DIAGNOSIS — E669 Obesity, unspecified: Secondary | ICD-10-CM | POA: Diagnosis not present

## 2019-10-09 DIAGNOSIS — R32 Unspecified urinary incontinence: Secondary | ICD-10-CM | POA: Diagnosis not present

## 2019-10-09 DIAGNOSIS — Z87440 Personal history of urinary (tract) infections: Secondary | ICD-10-CM | POA: Diagnosis not present

## 2019-10-10 DIAGNOSIS — E669 Obesity, unspecified: Secondary | ICD-10-CM | POA: Diagnosis not present

## 2019-10-10 DIAGNOSIS — Z87440 Personal history of urinary (tract) infections: Secondary | ICD-10-CM | POA: Diagnosis not present

## 2019-10-10 DIAGNOSIS — F329 Major depressive disorder, single episode, unspecified: Secondary | ICD-10-CM | POA: Diagnosis not present

## 2019-10-10 DIAGNOSIS — G20 Parkinson's disease: Secondary | ICD-10-CM | POA: Diagnosis not present

## 2019-10-10 DIAGNOSIS — R32 Unspecified urinary incontinence: Secondary | ICD-10-CM | POA: Diagnosis not present

## 2019-10-10 DIAGNOSIS — F060 Psychotic disorder with hallucinations due to known physiological condition: Secondary | ICD-10-CM | POA: Diagnosis not present

## 2019-10-11 DIAGNOSIS — F329 Major depressive disorder, single episode, unspecified: Secondary | ICD-10-CM | POA: Diagnosis not present

## 2019-10-11 DIAGNOSIS — R32 Unspecified urinary incontinence: Secondary | ICD-10-CM | POA: Diagnosis not present

## 2019-10-11 DIAGNOSIS — Z87440 Personal history of urinary (tract) infections: Secondary | ICD-10-CM | POA: Diagnosis not present

## 2019-10-11 DIAGNOSIS — F060 Psychotic disorder with hallucinations due to known physiological condition: Secondary | ICD-10-CM | POA: Diagnosis not present

## 2019-10-11 DIAGNOSIS — E669 Obesity, unspecified: Secondary | ICD-10-CM | POA: Diagnosis not present

## 2019-10-11 DIAGNOSIS — G20 Parkinson's disease: Secondary | ICD-10-CM | POA: Diagnosis not present

## 2019-10-14 DIAGNOSIS — Z87440 Personal history of urinary (tract) infections: Secondary | ICD-10-CM | POA: Diagnosis not present

## 2019-10-14 DIAGNOSIS — F060 Psychotic disorder with hallucinations due to known physiological condition: Secondary | ICD-10-CM | POA: Diagnosis not present

## 2019-10-14 DIAGNOSIS — R32 Unspecified urinary incontinence: Secondary | ICD-10-CM | POA: Diagnosis not present

## 2019-10-14 DIAGNOSIS — G20 Parkinson's disease: Secondary | ICD-10-CM | POA: Diagnosis not present

## 2019-10-14 DIAGNOSIS — F329 Major depressive disorder, single episode, unspecified: Secondary | ICD-10-CM | POA: Diagnosis not present

## 2019-10-14 DIAGNOSIS — E669 Obesity, unspecified: Secondary | ICD-10-CM | POA: Diagnosis not present

## 2019-10-16 DIAGNOSIS — F060 Psychotic disorder with hallucinations due to known physiological condition: Secondary | ICD-10-CM | POA: Diagnosis not present

## 2019-10-16 DIAGNOSIS — E669 Obesity, unspecified: Secondary | ICD-10-CM | POA: Diagnosis not present

## 2019-10-16 DIAGNOSIS — G20 Parkinson's disease: Secondary | ICD-10-CM | POA: Diagnosis not present

## 2019-10-16 DIAGNOSIS — R32 Unspecified urinary incontinence: Secondary | ICD-10-CM | POA: Diagnosis not present

## 2019-10-16 DIAGNOSIS — Z87440 Personal history of urinary (tract) infections: Secondary | ICD-10-CM | POA: Diagnosis not present

## 2019-10-16 DIAGNOSIS — F329 Major depressive disorder, single episode, unspecified: Secondary | ICD-10-CM | POA: Diagnosis not present

## 2019-10-17 DIAGNOSIS — F060 Psychotic disorder with hallucinations due to known physiological condition: Secondary | ICD-10-CM | POA: Diagnosis not present

## 2019-10-17 DIAGNOSIS — R32 Unspecified urinary incontinence: Secondary | ICD-10-CM | POA: Diagnosis not present

## 2019-10-17 DIAGNOSIS — G20 Parkinson's disease: Secondary | ICD-10-CM | POA: Diagnosis not present

## 2019-10-17 DIAGNOSIS — E669 Obesity, unspecified: Secondary | ICD-10-CM | POA: Diagnosis not present

## 2019-10-17 DIAGNOSIS — F329 Major depressive disorder, single episode, unspecified: Secondary | ICD-10-CM | POA: Diagnosis not present

## 2019-10-17 DIAGNOSIS — Z87440 Personal history of urinary (tract) infections: Secondary | ICD-10-CM | POA: Diagnosis not present

## 2019-10-18 DIAGNOSIS — F329 Major depressive disorder, single episode, unspecified: Secondary | ICD-10-CM | POA: Diagnosis not present

## 2019-10-18 DIAGNOSIS — R32 Unspecified urinary incontinence: Secondary | ICD-10-CM | POA: Diagnosis not present

## 2019-10-18 DIAGNOSIS — E669 Obesity, unspecified: Secondary | ICD-10-CM | POA: Diagnosis not present

## 2019-10-18 DIAGNOSIS — G20 Parkinson's disease: Secondary | ICD-10-CM | POA: Diagnosis not present

## 2019-10-18 DIAGNOSIS — Z6835 Body mass index (BMI) 35.0-35.9, adult: Secondary | ICD-10-CM | POA: Diagnosis not present

## 2019-10-18 DIAGNOSIS — Z87440 Personal history of urinary (tract) infections: Secondary | ICD-10-CM | POA: Diagnosis not present

## 2019-10-18 DIAGNOSIS — Z8701 Personal history of pneumonia (recurrent): Secondary | ICD-10-CM | POA: Diagnosis not present

## 2019-10-18 DIAGNOSIS — F060 Psychotic disorder with hallucinations due to known physiological condition: Secondary | ICD-10-CM | POA: Diagnosis not present

## 2019-10-20 DIAGNOSIS — R32 Unspecified urinary incontinence: Secondary | ICD-10-CM | POA: Diagnosis not present

## 2019-10-20 DIAGNOSIS — F329 Major depressive disorder, single episode, unspecified: Secondary | ICD-10-CM | POA: Diagnosis not present

## 2019-10-20 DIAGNOSIS — G20 Parkinson's disease: Secondary | ICD-10-CM | POA: Diagnosis not present

## 2019-10-20 DIAGNOSIS — E669 Obesity, unspecified: Secondary | ICD-10-CM | POA: Diagnosis not present

## 2019-10-20 DIAGNOSIS — Z87440 Personal history of urinary (tract) infections: Secondary | ICD-10-CM | POA: Diagnosis not present

## 2019-10-20 DIAGNOSIS — F060 Psychotic disorder with hallucinations due to known physiological condition: Secondary | ICD-10-CM | POA: Diagnosis not present

## 2019-10-21 ENCOUNTER — Other Ambulatory Visit (HOSPITAL_COMMUNITY): Payer: Self-pay | Admitting: Psychiatry

## 2019-10-21 DIAGNOSIS — F332 Major depressive disorder, recurrent severe without psychotic features: Secondary | ICD-10-CM

## 2019-10-21 DIAGNOSIS — E669 Obesity, unspecified: Secondary | ICD-10-CM | POA: Diagnosis not present

## 2019-10-21 DIAGNOSIS — G20 Parkinson's disease: Secondary | ICD-10-CM | POA: Diagnosis not present

## 2019-10-21 DIAGNOSIS — F060 Psychotic disorder with hallucinations due to known physiological condition: Secondary | ICD-10-CM | POA: Diagnosis not present

## 2019-10-21 DIAGNOSIS — F329 Major depressive disorder, single episode, unspecified: Secondary | ICD-10-CM | POA: Diagnosis not present

## 2019-10-21 DIAGNOSIS — R32 Unspecified urinary incontinence: Secondary | ICD-10-CM | POA: Diagnosis not present

## 2019-10-21 DIAGNOSIS — Z87440 Personal history of urinary (tract) infections: Secondary | ICD-10-CM | POA: Diagnosis not present

## 2019-10-21 DIAGNOSIS — F411 Generalized anxiety disorder: Secondary | ICD-10-CM

## 2019-10-22 ENCOUNTER — Other Ambulatory Visit: Payer: Self-pay | Admitting: *Deleted

## 2019-10-22 ENCOUNTER — Encounter: Payer: Self-pay | Admitting: *Deleted

## 2019-10-22 NOTE — Patient Outreach (Signed)
Telephone outreach and case closure.  Spoke with Mrs. Roddey today. She reports Mr. Amor is stable. She is providing his care 24/7, except for once every 2 weeks [facility] a Parkinson's support program sends an aide for a bath. Mrs. Noseworthy is very tired and stressed. It is difficult for her to go out of the house for errands because she doesn't have the support to leave her husband and she cannot leave him unattended.  Asked if she had called [facility] to discuss their services and she has not. Encouraged her that may be the solution for her to be confident he is being cared for during the day and she can reclaim that time for herself.  She says she is disappointed that she has to make muliple trips out of the home each month to pick up meds from [employer]. She reports she once had mailorder but she had problems with that. Inquired whether she had looked into a local pharmacy that delivers and she says she cannot afford that service and is reluctant to change pharmacies. I will request that our pharmacy team send her a list of pharmacies that deliver and the cost for her information.  Asked if they have had any further contact with Dr. Albesa Seen and she says, "No." Asked if pt has a follow up appointment with him or an associate and she says, "No." Suggest she call and make an appointment and request a routine follow up at least every 6 months. She can always call him or me if there are problems that arise.  Asked if there are any further needs or assistance that NP can offer and she denies further needs.  [name]. Myrtie Neither, MSN, [facility] Gerontological Nurse Practitioner [facility] Care Management [phone]

## 2019-10-24 ENCOUNTER — Other Ambulatory Visit: Payer: Self-pay | Admitting: Pharmacist

## 2019-10-24 DIAGNOSIS — R32 Unspecified urinary incontinence: Secondary | ICD-10-CM | POA: Diagnosis not present

## 2019-10-24 DIAGNOSIS — E669 Obesity, unspecified: Secondary | ICD-10-CM | POA: Diagnosis not present

## 2019-10-24 DIAGNOSIS — F329 Major depressive disorder, single episode, unspecified: Secondary | ICD-10-CM | POA: Diagnosis not present

## 2019-10-24 DIAGNOSIS — Z87440 Personal history of urinary (tract) infections: Secondary | ICD-10-CM | POA: Diagnosis not present

## 2019-10-24 DIAGNOSIS — G20 Parkinson's disease: Secondary | ICD-10-CM | POA: Diagnosis not present

## 2019-10-24 DIAGNOSIS — F060 Psychotic disorder with hallucinations due to known physiological condition: Secondary | ICD-10-CM | POA: Diagnosis not present

## 2019-10-24 NOTE — Patient Outreach (Signed)
[facility]) Care Management  [name] [name]   10/24/2019  [name] [name] 1951-01-11 [id number]  Reason for referral: Send medication compliance and delivery options to patient  Referral source: [facility] NP Current insurance: Unknown   Referral placed by [facility] NP for pharmacist to mail patient a list of local pharmacies that offer compliance packaging and delivery.  NP recommends mail only rather than telephone call to best reach patient.    PMHx includes but not limited to:  Parkinson's Disease, depression, memory loss, recurrent UTIs, chronic venous insufficiency, OAB  [facility]).   Plan: Will mail list of options for pharmacy compliance packaging and delivery in [place] to patient and will provide my contact information if patient or family would like to discuss further.    Ralene Bathe, PharmD, [place] [phone]

## 2019-10-28 ENCOUNTER — Telehealth: Payer: Self-pay | Admitting: Internal Medicine

## 2019-10-28 ENCOUNTER — Other Ambulatory Visit: Payer: Self-pay

## 2019-10-28 ENCOUNTER — Telehealth: Payer: Self-pay | Admitting: Neurology

## 2019-10-28 DIAGNOSIS — R32 Unspecified urinary incontinence: Secondary | ICD-10-CM | POA: Diagnosis not present

## 2019-10-28 DIAGNOSIS — F060 Psychotic disorder with hallucinations due to known physiological condition: Secondary | ICD-10-CM | POA: Diagnosis not present

## 2019-10-28 DIAGNOSIS — G20 Parkinson's disease: Secondary | ICD-10-CM | POA: Diagnosis not present

## 2019-10-28 DIAGNOSIS — Z87440 Personal history of urinary (tract) infections: Secondary | ICD-10-CM | POA: Diagnosis not present

## 2019-10-28 DIAGNOSIS — E669 Obesity, unspecified: Secondary | ICD-10-CM | POA: Diagnosis not present

## 2019-10-28 DIAGNOSIS — F329 Major depressive disorder, single episode, unspecified: Secondary | ICD-10-CM | POA: Diagnosis not present

## 2019-10-28 MED ORDER — CARBIDOPA-LEVODOPA 25-100 MG PO TABS: 2.0000 | ORAL_TABLET | Freq: Three times a day (TID) | ORAL | 1 refills | Status: DC

## 2019-10-28 NOTE — Telephone Encounter (Signed)
Sent to pharmacy, mychart message sent to patient

## 2019-10-28 NOTE — Telephone Encounter (Signed)
Wife is calling in about wrong dosage of medication- carbidopa levodopa medication- dosage needs to say 2 @ 7AM, 2 @ 11AM and then 2 @ 4pm. Pharm has everything correct but the 4 pm they have just 1 pill.  Thanks!

## 2019-10-28 NOTE — Telephone Encounter (Signed)
[name] [name] from [facility] called in asking for orders that where sent to were sent over in Dec. She wanted to check on this, I did ask for the orders to be refax

## 2019-10-30 DIAGNOSIS — R32 Unspecified urinary incontinence: Secondary | ICD-10-CM | POA: Diagnosis not present

## 2019-10-30 DIAGNOSIS — Z87440 Personal history of urinary (tract) infections: Secondary | ICD-10-CM | POA: Diagnosis not present

## 2019-10-30 DIAGNOSIS — F329 Major depressive disorder, single episode, unspecified: Secondary | ICD-10-CM | POA: Diagnosis not present

## 2019-10-30 DIAGNOSIS — F060 Psychotic disorder with hallucinations due to known physiological condition: Secondary | ICD-10-CM | POA: Diagnosis not present

## 2019-10-30 DIAGNOSIS — G20 Parkinson's disease: Secondary | ICD-10-CM | POA: Diagnosis not present

## 2019-10-30 DIAGNOSIS — E669 Obesity, unspecified: Secondary | ICD-10-CM | POA: Diagnosis not present

## 2019-10-31 DIAGNOSIS — E669 Obesity, unspecified: Secondary | ICD-10-CM | POA: Diagnosis not present

## 2019-10-31 DIAGNOSIS — G20 Parkinson's disease: Secondary | ICD-10-CM | POA: Diagnosis not present

## 2019-10-31 DIAGNOSIS — F329 Major depressive disorder, single episode, unspecified: Secondary | ICD-10-CM | POA: Diagnosis not present

## 2019-10-31 DIAGNOSIS — F060 Psychotic disorder with hallucinations due to known physiological condition: Secondary | ICD-10-CM | POA: Diagnosis not present

## 2019-10-31 DIAGNOSIS — Z87440 Personal history of urinary (tract) infections: Secondary | ICD-10-CM | POA: Diagnosis not present

## 2019-10-31 DIAGNOSIS — R32 Unspecified urinary incontinence: Secondary | ICD-10-CM | POA: Diagnosis not present

## 2019-11-04 DIAGNOSIS — Z87440 Personal history of urinary (tract) infections: Secondary | ICD-10-CM | POA: Diagnosis not present

## 2019-11-04 DIAGNOSIS — R32 Unspecified urinary incontinence: Secondary | ICD-10-CM | POA: Diagnosis not present

## 2019-11-04 DIAGNOSIS — E669 Obesity, unspecified: Secondary | ICD-10-CM | POA: Diagnosis not present

## 2019-11-04 DIAGNOSIS — G20 Parkinson's disease: Secondary | ICD-10-CM | POA: Diagnosis not present

## 2019-11-04 DIAGNOSIS — F329 Major depressive disorder, single episode, unspecified: Secondary | ICD-10-CM | POA: Diagnosis not present

## 2019-11-04 DIAGNOSIS — F060 Psychotic disorder with hallucinations due to known physiological condition: Secondary | ICD-10-CM | POA: Diagnosis not present

## 2019-11-05 DIAGNOSIS — E669 Obesity, unspecified: Secondary | ICD-10-CM | POA: Diagnosis not present

## 2019-11-05 DIAGNOSIS — F060 Psychotic disorder with hallucinations due to known physiological condition: Secondary | ICD-10-CM | POA: Diagnosis not present

## 2019-11-05 DIAGNOSIS — Z87440 Personal history of urinary (tract) infections: Secondary | ICD-10-CM | POA: Diagnosis not present

## 2019-11-05 DIAGNOSIS — R32 Unspecified urinary incontinence: Secondary | ICD-10-CM | POA: Diagnosis not present

## 2019-11-05 DIAGNOSIS — F329 Major depressive disorder, single episode, unspecified: Secondary | ICD-10-CM | POA: Diagnosis not present

## 2019-11-05 DIAGNOSIS — G20 Parkinson's disease: Secondary | ICD-10-CM | POA: Diagnosis not present

## 2019-11-07 DIAGNOSIS — R32 Unspecified urinary incontinence: Secondary | ICD-10-CM | POA: Diagnosis not present

## 2019-11-07 DIAGNOSIS — Z87440 Personal history of urinary (tract) infections: Secondary | ICD-10-CM | POA: Diagnosis not present

## 2019-11-07 DIAGNOSIS — F329 Major depressive disorder, single episode, unspecified: Secondary | ICD-10-CM | POA: Diagnosis not present

## 2019-11-07 DIAGNOSIS — E669 Obesity, unspecified: Secondary | ICD-10-CM | POA: Diagnosis not present

## 2019-11-07 DIAGNOSIS — G20 Parkinson's disease: Secondary | ICD-10-CM | POA: Diagnosis not present

## 2019-11-07 DIAGNOSIS — F060 Psychotic disorder with hallucinations due to known physiological condition: Secondary | ICD-10-CM | POA: Diagnosis not present

## 2019-11-14 DIAGNOSIS — F329 Major depressive disorder, single episode, unspecified: Secondary | ICD-10-CM | POA: Diagnosis not present

## 2019-11-14 DIAGNOSIS — E669 Obesity, unspecified: Secondary | ICD-10-CM | POA: Diagnosis not present

## 2019-11-14 DIAGNOSIS — R32 Unspecified urinary incontinence: Secondary | ICD-10-CM | POA: Diagnosis not present

## 2019-11-14 DIAGNOSIS — F060 Psychotic disorder with hallucinations due to known physiological condition: Secondary | ICD-10-CM | POA: Diagnosis not present

## 2019-11-14 DIAGNOSIS — G20 Parkinson's disease: Secondary | ICD-10-CM | POA: Diagnosis not present

## 2019-11-14 DIAGNOSIS — Z87440 Personal history of urinary (tract) infections: Secondary | ICD-10-CM | POA: Diagnosis not present

## 2019-11-17 DIAGNOSIS — Z6835 Body mass index (BMI) 35.0-35.9, adult: Secondary | ICD-10-CM | POA: Diagnosis not present

## 2019-11-17 DIAGNOSIS — Z87440 Personal history of urinary (tract) infections: Secondary | ICD-10-CM | POA: Diagnosis not present

## 2019-11-17 DIAGNOSIS — G20 Parkinson's disease: Secondary | ICD-10-CM | POA: Diagnosis not present

## 2019-11-17 DIAGNOSIS — R32 Unspecified urinary incontinence: Secondary | ICD-10-CM | POA: Diagnosis not present

## 2019-11-17 DIAGNOSIS — L89322 Pressure ulcer of left buttock, stage 2: Secondary | ICD-10-CM | POA: Diagnosis not present

## 2019-11-17 DIAGNOSIS — Z8701 Personal history of pneumonia (recurrent): Secondary | ICD-10-CM | POA: Diagnosis not present

## 2019-11-17 DIAGNOSIS — F329 Major depressive disorder, single episode, unspecified: Secondary | ICD-10-CM | POA: Diagnosis not present

## 2019-11-17 DIAGNOSIS — F060 Psychotic disorder with hallucinations due to known physiological condition: Secondary | ICD-10-CM | POA: Diagnosis not present

## 2019-11-17 DIAGNOSIS — Z4800 Encounter for change or removal of nonsurgical wound dressing: Secondary | ICD-10-CM | POA: Diagnosis not present

## 2019-11-17 DIAGNOSIS — L89312 Pressure ulcer of right buttock, stage 2: Secondary | ICD-10-CM | POA: Diagnosis not present

## 2019-11-17 DIAGNOSIS — E669 Obesity, unspecified: Secondary | ICD-10-CM | POA: Diagnosis not present

## 2019-11-20 DIAGNOSIS — L89312 Pressure ulcer of right buttock, stage 2: Secondary | ICD-10-CM | POA: Diagnosis not present

## 2019-11-20 DIAGNOSIS — L89322 Pressure ulcer of left buttock, stage 2: Secondary | ICD-10-CM | POA: Diagnosis not present

## 2019-11-20 DIAGNOSIS — Z4800 Encounter for change or removal of nonsurgical wound dressing: Secondary | ICD-10-CM | POA: Diagnosis not present

## 2019-11-20 DIAGNOSIS — F060 Psychotic disorder with hallucinations due to known physiological condition: Secondary | ICD-10-CM | POA: Diagnosis not present

## 2019-11-20 DIAGNOSIS — Z87440 Personal history of urinary (tract) infections: Secondary | ICD-10-CM | POA: Diagnosis not present

## 2019-11-20 DIAGNOSIS — G20 Parkinson's disease: Secondary | ICD-10-CM | POA: Diagnosis not present

## 2019-11-22 ENCOUNTER — Telehealth: Payer: Self-pay

## 2019-11-22 NOTE — Telephone Encounter (Signed)
New message    [facility] calling    Fax was sent on 11/21/2019 for Dr. Sharlet Salina .

## 2019-11-25 NOTE — Telephone Encounter (Signed)
Form given to PCP. Once it is completed it will be faxed back.

## 2019-11-28 DIAGNOSIS — Z4800 Encounter for change or removal of nonsurgical wound dressing: Secondary | ICD-10-CM | POA: Diagnosis not present

## 2019-11-28 DIAGNOSIS — F060 Psychotic disorder with hallucinations due to known physiological condition: Secondary | ICD-10-CM | POA: Diagnosis not present

## 2019-11-28 DIAGNOSIS — G20 Parkinson's disease: Secondary | ICD-10-CM | POA: Diagnosis not present

## 2019-11-28 DIAGNOSIS — Z87440 Personal history of urinary (tract) infections: Secondary | ICD-10-CM | POA: Diagnosis not present

## 2019-11-28 DIAGNOSIS — L89322 Pressure ulcer of left buttock, stage 2: Secondary | ICD-10-CM | POA: Diagnosis not present

## 2019-11-28 DIAGNOSIS — L89312 Pressure ulcer of right buttock, stage 2: Secondary | ICD-10-CM | POA: Diagnosis not present

## 2019-12-01 DIAGNOSIS — L89312 Pressure ulcer of right buttock, stage 2: Secondary | ICD-10-CM | POA: Diagnosis not present

## 2019-12-02 ENCOUNTER — Ambulatory Visit (INDEPENDENT_AMBULATORY_CARE_PROVIDER_SITE_OTHER): Payer: Medicare Other | Admitting: Psychiatry

## 2019-12-02 ENCOUNTER — Encounter (HOSPITAL_COMMUNITY): Payer: Self-pay | Admitting: Psychiatry

## 2019-12-02 ENCOUNTER — Other Ambulatory Visit: Payer: Self-pay

## 2019-12-02 DIAGNOSIS — F411 Generalized anxiety disorder: Secondary | ICD-10-CM

## 2019-12-02 DIAGNOSIS — F332 Major depressive disorder, recurrent severe without psychotic features: Secondary | ICD-10-CM | POA: Diagnosis not present

## 2019-12-02 DIAGNOSIS — G20 Parkinson's disease: Secondary | ICD-10-CM | POA: Diagnosis not present

## 2019-12-02 MED ORDER — BUPROPION HCL ER (XL) 150 MG PO TB24: 150.0000 mg | ORAL_TABLET | Freq: Every day | ORAL | 0 refills | Status: DC

## 2019-12-02 MED ORDER — CITALOPRAM HYDROBROMIDE 40 MG PO TABS: 40.0000 mg | ORAL_TABLET | Freq: Every day | ORAL | 0 refills | Status: DC

## 2019-12-02 MED ORDER — BUPROPION HCL ER (XL) 300 MG PO TB24: 300.0000 mg | ORAL_TABLET | ORAL | 0 refills | Status: DC

## 2019-12-02 MED ORDER — OLANZAPINE 2.5 MG PO TABS: 2.5000 mg | ORAL_TABLET | Freq: Every day | ORAL | 0 refills | Status: DC

## 2019-12-02 NOTE — Progress Notes (Signed)
Virtual Visit via Telephone Note  I connected with Doni A Boschee on 12/02/19 at  2:00 PM EST by telephone and verified that I am speaking with the correct person using two identifiers.   I discussed the limitations, risks, security and privacy concerns of performing an evaluation and management service by telephone and the availability of in person appointments. I also discussed with the patient that there may be a patient responsible charge related to this service. The patient expressed understanding and agreed to proceed.   History of Present Illness: Patient was evaluated through phone session.  His wife is also close by.  Patient is taking Wellbutrin, olanzapine and Celexa.  Recently he had changed his PCP and now seeing Dr. Arnette Norris who makes house call.  His wife reported there is a change in antibiotic and he takes prophylactically antibiotic to prevent UTI and fax working very well.  He also noticed much improvement in his tremors and lately he has no paranoia, delusions, hallucinations.  He feels his depression is stable and patient and his wife does not want to change medication.  His wife endorsed olanzapine is helping his delusions and sleep.  His appetite is okay.  His energy level is fair.   Past Psychiatric History:Reviewed. 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 Seeing in this office since September 2009. Saw multiple psychiatrists in the past.  Psychiatric Specialty Exam: Physical Exam  Review of Systems  There were no vitals taken for this visit.There is no height or weight on file to calculate BMI.  General Appearance: NA  Eye Contact:  NA  Speech:  Slow  Volume:  Decreased  Mood:  Dysphoric  Affect:  NA  Thought Process:  Descriptions of  Associations: Intact  Orientation:  Full (Time, Place, and Person)  Thought Content:  Rumination  Suicidal Thoughts:  No  Homicidal Thoughts:  No  Memory:  Immediate;   Fair Recent;   Fair Remote;   Fair  Judgement:  Fair  Insight:  Present  Psychomotor Activity:  NA  Concentration:  Concentration: Fair and Attention Span: Fair  Recall:  [employer] of Knowledge:  Fair  Language:  Good  Akathisia:  mild tremors  Handed:  Right  AIMS (if indicated):     Assets:  Communication Skills Desire for Improvement Housing Social Support  ADL's:  Intact  Cognition:  Impaired,  Mild  Sleep:   ok      Assessment and Plan: Major depressive disorder, recurrent.  Generalized anxiety disorder.  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  Patient change his PCP who does make house calls.  His wife provided name and number of Dr. Hardin Negus [phone] in case decided to communicate with him.  In few days daughter is coming to visit him and I encouraged to have written consent sent to [place] so we can share the records.  At this time patient and his wife does not want to change medication since they are working very well.  I discussed medication side effects and benefits.  Continue olanzapine 2.5 mg at bedtime, Wellbutrin XL 300 mg in the morning and 150 in the evening and citalopram 40 mg daily.  Recommended to call us back if he has any question or any concern.  Follow-up in 3 months.   Follow Up Instructions:    I discussed the assessment and treatment plan with the patient. The patient was provided an  opportunity to ask questions and all were answered. The patient agreed with the plan and demonstrated an understanding of the instructions.   The patient was advised to call back or seek an in-person evaluation if the symptoms worsen or if the condition fails to improve as anticipated.  I provided 20 minutes of non-face-to-face time during this encounter.   Kathlee Nations, MD

## 2019-12-05 DIAGNOSIS — Z4800 Encounter for change or removal of nonsurgical wound dressing: Secondary | ICD-10-CM | POA: Diagnosis not present

## 2019-12-05 DIAGNOSIS — G20 Parkinson's disease: Secondary | ICD-10-CM | POA: Diagnosis not present

## 2019-12-05 DIAGNOSIS — L89312 Pressure ulcer of right buttock, stage 2: Secondary | ICD-10-CM | POA: Diagnosis not present

## 2019-12-05 DIAGNOSIS — L89322 Pressure ulcer of left buttock, stage 2: Secondary | ICD-10-CM | POA: Diagnosis not present

## 2019-12-05 DIAGNOSIS — Z87440 Personal history of urinary (tract) infections: Secondary | ICD-10-CM | POA: Diagnosis not present

## 2019-12-05 DIAGNOSIS — F060 Psychotic disorder with hallucinations due to known physiological condition: Secondary | ICD-10-CM | POA: Diagnosis not present

## 2019-12-09 DIAGNOSIS — K5909 Other constipation: Secondary | ICD-10-CM | POA: Diagnosis not present

## 2019-12-09 DIAGNOSIS — L89312 Pressure ulcer of right buttock, stage 2: Secondary | ICD-10-CM | POA: Diagnosis not present

## 2019-12-09 DIAGNOSIS — Z87440 Personal history of urinary (tract) infections: Secondary | ICD-10-CM | POA: Diagnosis not present

## 2019-12-09 DIAGNOSIS — Z4800 Encounter for change or removal of nonsurgical wound dressing: Secondary | ICD-10-CM | POA: Diagnosis not present

## 2019-12-09 DIAGNOSIS — L219 Seborrheic dermatitis, unspecified: Secondary | ICD-10-CM | POA: Diagnosis not present

## 2019-12-09 DIAGNOSIS — L89322 Pressure ulcer of left buttock, stage 2: Secondary | ICD-10-CM | POA: Diagnosis not present

## 2019-12-09 DIAGNOSIS — G20 Parkinson's disease: Secondary | ICD-10-CM | POA: Diagnosis not present

## 2019-12-09 DIAGNOSIS — F060 Psychotic disorder with hallucinations due to known physiological condition: Secondary | ICD-10-CM | POA: Diagnosis not present

## 2019-12-09 DIAGNOSIS — L89159 Pressure ulcer of sacral region, unspecified stage: Secondary | ICD-10-CM | POA: Diagnosis not present

## 2019-12-09 DIAGNOSIS — N390 Urinary tract infection, site not specified: Secondary | ICD-10-CM | POA: Diagnosis not present

## 2019-12-11 DIAGNOSIS — L89322 Pressure ulcer of left buttock, stage 2: Secondary | ICD-10-CM | POA: Diagnosis not present

## 2019-12-11 DIAGNOSIS — G20 Parkinson's disease: Secondary | ICD-10-CM | POA: Diagnosis not present

## 2019-12-11 DIAGNOSIS — L89312 Pressure ulcer of right buttock, stage 2: Secondary | ICD-10-CM | POA: Diagnosis not present

## 2019-12-11 DIAGNOSIS — F060 Psychotic disorder with hallucinations due to known physiological condition: Secondary | ICD-10-CM | POA: Diagnosis not present

## 2019-12-11 DIAGNOSIS — Z4800 Encounter for change or removal of nonsurgical wound dressing: Secondary | ICD-10-CM | POA: Diagnosis not present

## 2019-12-11 DIAGNOSIS — Z87440 Personal history of urinary (tract) infections: Secondary | ICD-10-CM | POA: Diagnosis not present

## 2019-12-12 DIAGNOSIS — Z4800 Encounter for change or removal of nonsurgical wound dressing: Secondary | ICD-10-CM | POA: Diagnosis not present

## 2019-12-12 DIAGNOSIS — G20 Parkinson's disease: Secondary | ICD-10-CM | POA: Diagnosis not present

## 2019-12-12 DIAGNOSIS — Z87440 Personal history of urinary (tract) infections: Secondary | ICD-10-CM | POA: Diagnosis not present

## 2019-12-12 DIAGNOSIS — F060 Psychotic disorder with hallucinations due to known physiological condition: Secondary | ICD-10-CM | POA: Diagnosis not present

## 2019-12-12 DIAGNOSIS — L89312 Pressure ulcer of right buttock, stage 2: Secondary | ICD-10-CM | POA: Diagnosis not present

## 2019-12-12 DIAGNOSIS — L89322 Pressure ulcer of left buttock, stage 2: Secondary | ICD-10-CM | POA: Diagnosis not present

## 2019-12-16 ENCOUNTER — Telehealth: Payer: Self-pay | Admitting: Neurology

## 2019-12-16 DIAGNOSIS — L89322 Pressure ulcer of left buttock, stage 2: Secondary | ICD-10-CM | POA: Diagnosis not present

## 2019-12-16 DIAGNOSIS — F060 Psychotic disorder with hallucinations due to known physiological condition: Secondary | ICD-10-CM | POA: Diagnosis not present

## 2019-12-16 DIAGNOSIS — Z4800 Encounter for change or removal of nonsurgical wound dressing: Secondary | ICD-10-CM | POA: Diagnosis not present

## 2019-12-16 DIAGNOSIS — L89312 Pressure ulcer of right buttock, stage 2: Secondary | ICD-10-CM | POA: Diagnosis not present

## 2019-12-16 DIAGNOSIS — Z87440 Personal history of urinary (tract) infections: Secondary | ICD-10-CM | POA: Diagnosis not present

## 2019-12-16 DIAGNOSIS — G20 Parkinson's disease: Secondary | ICD-10-CM | POA: Diagnosis not present

## 2019-12-16 NOTE — Telephone Encounter (Signed)
Called spoke to wife and let her know per Dr. Verdia Kuba was not appropriate for patient. She verbalized understanding and said thank you for calling her back and letting her know.

## 2019-12-16 NOTE — Telephone Encounter (Signed)
Patient's wife called with questions about a new patch for Parkinson's disease starting with: "Neupro."   [facility] [address]

## 2019-12-16 NOTE — Telephone Encounter (Signed)
Its not new but its not appropriate for him either.  Its basically the same as requip that he was on and can generate more confusion/hallucinations

## 2019-12-17 DIAGNOSIS — Z4800 Encounter for change or removal of nonsurgical wound dressing: Secondary | ICD-10-CM | POA: Diagnosis not present

## 2019-12-17 DIAGNOSIS — L89312 Pressure ulcer of right buttock, stage 2: Secondary | ICD-10-CM | POA: Diagnosis not present

## 2019-12-17 DIAGNOSIS — G20 Parkinson's disease: Secondary | ICD-10-CM | POA: Diagnosis not present

## 2019-12-17 DIAGNOSIS — R32 Unspecified urinary incontinence: Secondary | ICD-10-CM | POA: Diagnosis not present

## 2019-12-17 DIAGNOSIS — Z8701 Personal history of pneumonia (recurrent): Secondary | ICD-10-CM | POA: Diagnosis not present

## 2019-12-17 DIAGNOSIS — L89322 Pressure ulcer of left buttock, stage 2: Secondary | ICD-10-CM | POA: Diagnosis not present

## 2019-12-17 DIAGNOSIS — F060 Psychotic disorder with hallucinations due to known physiological condition: Secondary | ICD-10-CM | POA: Diagnosis not present

## 2019-12-17 DIAGNOSIS — Z6835 Body mass index (BMI) 35.0-35.9, adult: Secondary | ICD-10-CM | POA: Diagnosis not present

## 2019-12-17 DIAGNOSIS — E669 Obesity, unspecified: Secondary | ICD-10-CM | POA: Diagnosis not present

## 2019-12-17 DIAGNOSIS — Z87440 Personal history of urinary (tract) infections: Secondary | ICD-10-CM | POA: Diagnosis not present

## 2019-12-17 DIAGNOSIS — F329 Major depressive disorder, single episode, unspecified: Secondary | ICD-10-CM | POA: Diagnosis not present

## 2019-12-19 DIAGNOSIS — L89312 Pressure ulcer of right buttock, stage 2: Secondary | ICD-10-CM | POA: Diagnosis not present

## 2019-12-19 DIAGNOSIS — Z4800 Encounter for change or removal of nonsurgical wound dressing: Secondary | ICD-10-CM | POA: Diagnosis not present

## 2019-12-19 DIAGNOSIS — G20 Parkinson's disease: Secondary | ICD-10-CM | POA: Diagnosis not present

## 2019-12-19 DIAGNOSIS — Z87440 Personal history of urinary (tract) infections: Secondary | ICD-10-CM | POA: Diagnosis not present

## 2019-12-19 DIAGNOSIS — F060 Psychotic disorder with hallucinations due to known physiological condition: Secondary | ICD-10-CM | POA: Diagnosis not present

## 2019-12-19 DIAGNOSIS — L89322 Pressure ulcer of left buttock, stage 2: Secondary | ICD-10-CM | POA: Diagnosis not present

## 2019-12-20 DIAGNOSIS — G20 Parkinson's disease: Secondary | ICD-10-CM | POA: Diagnosis not present

## 2019-12-20 DIAGNOSIS — F060 Psychotic disorder with hallucinations due to known physiological condition: Secondary | ICD-10-CM | POA: Diagnosis not present

## 2019-12-20 DIAGNOSIS — Z4800 Encounter for change or removal of nonsurgical wound dressing: Secondary | ICD-10-CM | POA: Diagnosis not present

## 2019-12-20 DIAGNOSIS — L89312 Pressure ulcer of right buttock, stage 2: Secondary | ICD-10-CM | POA: Diagnosis not present

## 2019-12-20 DIAGNOSIS — Z87440 Personal history of urinary (tract) infections: Secondary | ICD-10-CM | POA: Diagnosis not present

## 2019-12-20 DIAGNOSIS — L89322 Pressure ulcer of left buttock, stage 2: Secondary | ICD-10-CM | POA: Diagnosis not present

## 2019-12-23 DIAGNOSIS — G20 Parkinson's disease: Secondary | ICD-10-CM | POA: Diagnosis not present

## 2019-12-23 DIAGNOSIS — Z4800 Encounter for change or removal of nonsurgical wound dressing: Secondary | ICD-10-CM | POA: Diagnosis not present

## 2019-12-23 DIAGNOSIS — Z87440 Personal history of urinary (tract) infections: Secondary | ICD-10-CM | POA: Diagnosis not present

## 2019-12-23 DIAGNOSIS — L89312 Pressure ulcer of right buttock, stage 2: Secondary | ICD-10-CM | POA: Diagnosis not present

## 2019-12-23 DIAGNOSIS — L89322 Pressure ulcer of left buttock, stage 2: Secondary | ICD-10-CM | POA: Diagnosis not present

## 2019-12-23 DIAGNOSIS — F060 Psychotic disorder with hallucinations due to known physiological condition: Secondary | ICD-10-CM | POA: Diagnosis not present

## 2019-12-27 DIAGNOSIS — Z87440 Personal history of urinary (tract) infections: Secondary | ICD-10-CM | POA: Diagnosis not present

## 2019-12-27 DIAGNOSIS — G20 Parkinson's disease: Secondary | ICD-10-CM | POA: Diagnosis not present

## 2019-12-27 DIAGNOSIS — L89322 Pressure ulcer of left buttock, stage 2: Secondary | ICD-10-CM | POA: Diagnosis not present

## 2019-12-27 DIAGNOSIS — F060 Psychotic disorder with hallucinations due to known physiological condition: Secondary | ICD-10-CM | POA: Diagnosis not present

## 2019-12-27 DIAGNOSIS — Z4800 Encounter for change or removal of nonsurgical wound dressing: Secondary | ICD-10-CM | POA: Diagnosis not present

## 2019-12-27 DIAGNOSIS — L89312 Pressure ulcer of right buttock, stage 2: Secondary | ICD-10-CM | POA: Diagnosis not present

## 2020-01-01 DIAGNOSIS — Z4800 Encounter for change or removal of nonsurgical wound dressing: Secondary | ICD-10-CM | POA: Diagnosis not present

## 2020-01-01 DIAGNOSIS — L89322 Pressure ulcer of left buttock, stage 2: Secondary | ICD-10-CM | POA: Diagnosis not present

## 2020-01-01 DIAGNOSIS — F060 Psychotic disorder with hallucinations due to known physiological condition: Secondary | ICD-10-CM | POA: Diagnosis not present

## 2020-01-01 DIAGNOSIS — G20 Parkinson's disease: Secondary | ICD-10-CM | POA: Diagnosis not present

## 2020-01-01 DIAGNOSIS — L89312 Pressure ulcer of right buttock, stage 2: Secondary | ICD-10-CM | POA: Diagnosis not present

## 2020-01-01 DIAGNOSIS — Z87440 Personal history of urinary (tract) infections: Secondary | ICD-10-CM | POA: Diagnosis not present

## 2020-01-02 DIAGNOSIS — L89322 Pressure ulcer of left buttock, stage 2: Secondary | ICD-10-CM | POA: Diagnosis not present

## 2020-01-02 DIAGNOSIS — G20 Parkinson's disease: Secondary | ICD-10-CM | POA: Diagnosis not present

## 2020-01-02 DIAGNOSIS — Z87440 Personal history of urinary (tract) infections: Secondary | ICD-10-CM | POA: Diagnosis not present

## 2020-01-02 DIAGNOSIS — L89312 Pressure ulcer of right buttock, stage 2: Secondary | ICD-10-CM | POA: Diagnosis not present

## 2020-01-02 DIAGNOSIS — Z4800 Encounter for change or removal of nonsurgical wound dressing: Secondary | ICD-10-CM | POA: Diagnosis not present

## 2020-01-02 DIAGNOSIS — F060 Psychotic disorder with hallucinations due to known physiological condition: Secondary | ICD-10-CM | POA: Diagnosis not present

## 2020-01-06 DIAGNOSIS — F060 Psychotic disorder with hallucinations due to known physiological condition: Secondary | ICD-10-CM | POA: Diagnosis not present

## 2020-01-06 DIAGNOSIS — L89312 Pressure ulcer of right buttock, stage 2: Secondary | ICD-10-CM | POA: Diagnosis not present

## 2020-01-06 DIAGNOSIS — Z4800 Encounter for change or removal of nonsurgical wound dressing: Secondary | ICD-10-CM | POA: Diagnosis not present

## 2020-01-06 DIAGNOSIS — L89322 Pressure ulcer of left buttock, stage 2: Secondary | ICD-10-CM | POA: Diagnosis not present

## 2020-01-06 DIAGNOSIS — G20 Parkinson's disease: Secondary | ICD-10-CM | POA: Diagnosis not present

## 2020-01-06 DIAGNOSIS — Z87440 Personal history of urinary (tract) infections: Secondary | ICD-10-CM | POA: Diagnosis not present

## 2020-01-09 DIAGNOSIS — G20 Parkinson's disease: Secondary | ICD-10-CM | POA: Diagnosis not present

## 2020-01-09 DIAGNOSIS — Z4800 Encounter for change or removal of nonsurgical wound dressing: Secondary | ICD-10-CM | POA: Diagnosis not present

## 2020-01-09 DIAGNOSIS — F060 Psychotic disorder with hallucinations due to known physiological condition: Secondary | ICD-10-CM | POA: Diagnosis not present

## 2020-01-09 DIAGNOSIS — L89312 Pressure ulcer of right buttock, stage 2: Secondary | ICD-10-CM | POA: Diagnosis not present

## 2020-01-09 DIAGNOSIS — L89322 Pressure ulcer of left buttock, stage 2: Secondary | ICD-10-CM | POA: Diagnosis not present

## 2020-01-09 DIAGNOSIS — Z87440 Personal history of urinary (tract) infections: Secondary | ICD-10-CM | POA: Diagnosis not present

## 2020-01-13 DIAGNOSIS — L89322 Pressure ulcer of left buttock, stage 2: Secondary | ICD-10-CM | POA: Diagnosis not present

## 2020-01-13 DIAGNOSIS — G20 Parkinson's disease: Secondary | ICD-10-CM | POA: Diagnosis not present

## 2020-01-13 DIAGNOSIS — Z4800 Encounter for change or removal of nonsurgical wound dressing: Secondary | ICD-10-CM | POA: Diagnosis not present

## 2020-01-13 DIAGNOSIS — Z87440 Personal history of urinary (tract) infections: Secondary | ICD-10-CM | POA: Diagnosis not present

## 2020-01-13 DIAGNOSIS — F060 Psychotic disorder with hallucinations due to known physiological condition: Secondary | ICD-10-CM | POA: Diagnosis not present

## 2020-01-13 DIAGNOSIS — L89312 Pressure ulcer of right buttock, stage 2: Secondary | ICD-10-CM | POA: Diagnosis not present

## 2020-01-15 DIAGNOSIS — L89312 Pressure ulcer of right buttock, stage 2: Secondary | ICD-10-CM | POA: Diagnosis not present

## 2020-01-15 DIAGNOSIS — G20 Parkinson's disease: Secondary | ICD-10-CM | POA: Diagnosis not present

## 2020-01-15 DIAGNOSIS — Z87440 Personal history of urinary (tract) infections: Secondary | ICD-10-CM | POA: Diagnosis not present

## 2020-01-15 DIAGNOSIS — L89322 Pressure ulcer of left buttock, stage 2: Secondary | ICD-10-CM | POA: Diagnosis not present

## 2020-01-15 DIAGNOSIS — Z4800 Encounter for change or removal of nonsurgical wound dressing: Secondary | ICD-10-CM | POA: Diagnosis not present

## 2020-01-15 DIAGNOSIS — F060 Psychotic disorder with hallucinations due to known physiological condition: Secondary | ICD-10-CM | POA: Diagnosis not present

## 2020-01-16 DIAGNOSIS — G20 Parkinson's disease: Secondary | ICD-10-CM | POA: Diagnosis not present

## 2020-01-16 DIAGNOSIS — Z4800 Encounter for change or removal of nonsurgical wound dressing: Secondary | ICD-10-CM | POA: Diagnosis not present

## 2020-01-16 DIAGNOSIS — L89322 Pressure ulcer of left buttock, stage 2: Secondary | ICD-10-CM | POA: Diagnosis not present

## 2020-01-22 DIAGNOSIS — L89159 Pressure ulcer of sacral region, unspecified stage: Secondary | ICD-10-CM | POA: Diagnosis not present

## 2020-01-22 DIAGNOSIS — M6281 Muscle weakness (generalized): Secondary | ICD-10-CM | POA: Diagnosis not present

## 2020-01-22 DIAGNOSIS — N390 Urinary tract infection, site not specified: Secondary | ICD-10-CM | POA: Diagnosis not present

## 2020-01-22 DIAGNOSIS — R21 Rash and other nonspecific skin eruption: Secondary | ICD-10-CM | POA: Diagnosis not present

## 2020-01-27 NOTE — Progress Notes (Signed)
Virtual Visit Via Video   The purpose of this virtual visit is to provide medical care while limiting exposure to the novel coronavirus.    Consent was obtained for video visit:  Yes.   Answered questions that patient had about telehealth interaction:  Yes.   I discussed the limitations, risks, security and privacy concerns of performing an evaluation and management service by telemedicine. I also discussed with the patient that there may be a patient responsible charge related to this service. The patient expressed understanding and agreed to proceed.  Pt location: Home Physician Location: home Name of referring provider:  [name], * I connected with Therisa Doyne Thelander at patients initiation/request on 01/29/2020 at  9:15 AM EDT by video enabled telemedicine application and verified that I am speaking with the correct person using two identifiers. Pt MRN:  [id number] Pt DOB:  June 18, 1951 Video Participants:  Tommie Raymond A Posey;  Wife supplements hx  Assessment/Plan:   1.  Parkinsonism  -Change dosing of carbidopa/levodopa 25/100 and increase to 2 at 8am/2 at 11am/2 at 2pm/1 at 5pm  -discussed home modifications in the future for planning (shower upstairs) 2.  Depression, with history of depression related psychosis  -On olanzapine.  They understand that olanzapine can make his parkinsonism worse.  The addition, however, definitely improved his depression related psychosis.  -Following with psychiatry. 3.  Memory change  -Multiple neurocognitive testing.  Had last outpatient neurocognitive testing in December, 2017, and a shorter evaluation in March, 2020 in the hospital.  None of these had indicated dementia.  4.  Follow up is anticipated in the next 4-6 months, sooner should new neurologic issues arise.  Wife wants to keep VV for now as hard to get patient into the office  Subjective   Patient seen today in follow-up for Parkinson's.  My records as well as any outside  records that were made available to me were reviewed.  Pt denies falls but my walking "is down to a shuffle."  Wife thinks that he is more stiff.  He has a Physiological scientist, [employer] - josh - that comes 2 days a week.  He has a RSB coach coming to the house 1 day a week.  He has to go upstairs to take a shower and does that 2 days a week and wife thinks that they are managing that well.    He has a new PCP that comes to the home - Dr. Kathaleen Bury, Dr. Darliss Cheney.  The NP's usually come to the home.  Pt denies lightheadedness, near syncope.  No hallucinations with olanzapine.  Mood has been good.  He has been vaccinated with the J&J vaccine  Current movement d/o meds:  Carbidopa/levodopa 25/100, 2 tablets 3 times per day On olanzapine, 2.5 mg daily from psychiatry  Prior medications: Ropinirole; entacapone; levodopa   Current Outpatient Medications on File Prior to Visit  Medication Sig Dispense Refill  . B Complex-C (B-COMPLEX WITH VITAMIN C) tablet Take 1 tablet by mouth daily.    Marland Kitchen buPROPion (WELLBUTRIN XL) 150 MG 24 hr tablet Take 1 tablet (150 mg total) by mouth daily. (Patient taking differently: Take 150 mg by mouth at bedtime. ) 90 tablet 0  . buPROPion (WELLBUTRIN XL) 300 MG 24 hr tablet Take 1 tablet (300 mg total) by mouth every morning. 90 tablet 0  . citalopram (CELEXA) 40 MG tablet Take 1 tablet (40 mg total) by mouth daily. Dose adjusted for safety. Please discuss with prescribing physician. 90 tablet  0  . CRANBERRY PO Take 650 mg by mouth daily.    . hydrocortisone cream 1 % Apply 1 application topically 2 (two) times a week.    Marland Kitchen [name] (ZYPREXA) 2.5 MG tablet Take 1 tablet (2.5 mg total) by mouth at bedtime. 90 tablet 0  . Omega 3-6-9 Fatty Acids (OMEGA 3-6-9 PO) Take 1 capsule by mouth daily.     Marland Kitchen trimethoprim (TRIMPEX) 100 MG tablet Take 100 mg by mouth daily.    . TURMERIC CURCUMIN PO Take 1 capsule by mouth daily.      No current facility-administered medications on file prior to  visit.     Objective   Vitals:   01/28/20 1355  Weight: 300 lb (136.1 kg)  Height: 6\' 2"  (1.88 m)   GEN:  The patient appears stated age and is in NAD.  Neurological examination:  Orientation: The patient is alert and oriented x3. Cranial nerves: There is good facial symmetry. There is facial hypomimia with lips parted.  The speech is fluent and clear. Soft palate rises symmetrically and there is no tongue deviation. Hearing is intact to conversational tone. Motor: Strength is at least antigravity x 4.   Shoulder shrug is equal and symmetric.  There is no pronator drift.  Movement examination: Tone: unable Abnormal movements: none Coordination:  There is  decremation with RAM's, with any form of RAMS, including alternating supination and pronation of the forearm, hand opening and closing, finger taps, heel taps and toe taps, L more than right Gait and Station: The patient arises with a lift chair.   He is given a walker.  He is short stepped but walks well with the walker    Follow up Instructions      -I discussed the assessment and treatment plan with the patient. The patient was provided an opportunity to ask questions and all were answered. The patient agreed with the plan and demonstrated an understanding of the instructions.   The patient was advised to call back or seek an in-person evaluation if the symptoms worsen or if the condition fails to improve as anticipated.    Total time spent on today's visit was 35 minutes, including both face-to-face time and nonface-to-face time.  Time included that spent on review of records (prior notes available to me/labs/imaging if pertinent), discussing treatment and goals, answering patient's questions and coordinating care.   Alonza Bogus, DO

## 2020-01-28 ENCOUNTER — Telehealth: Payer: Medicare Other | Admitting: Neurology

## 2020-01-28 ENCOUNTER — Encounter: Payer: Self-pay | Admitting: Neurology

## 2020-01-29 ENCOUNTER — Telehealth (INDEPENDENT_AMBULATORY_CARE_PROVIDER_SITE_OTHER): Payer: Medicare Other | Admitting: Neurology

## 2020-01-29 ENCOUNTER — Other Ambulatory Visit: Payer: Self-pay

## 2020-01-29 VITALS — Ht 74.0 in | Wt 300.0 lb

## 2020-01-29 DIAGNOSIS — F331 Major depressive disorder, recurrent, moderate: Secondary | ICD-10-CM | POA: Diagnosis not present

## 2020-01-29 DIAGNOSIS — G20 Parkinson's disease: Secondary | ICD-10-CM | POA: Diagnosis not present

## 2020-01-29 MED ORDER — CARBIDOPA-LEVODOPA 25-100 MG PO TABS: ORAL_TABLET | ORAL | 1 refills | Status: DC

## 2020-02-15 DIAGNOSIS — L89322 Pressure ulcer of left buttock, stage 2: Secondary | ICD-10-CM | POA: Diagnosis not present

## 2020-02-19 DIAGNOSIS — R350 Frequency of micturition: Secondary | ICD-10-CM | POA: Diagnosis not present

## 2020-02-19 DIAGNOSIS — N3941 Urge incontinence: Secondary | ICD-10-CM | POA: Diagnosis not present

## 2020-02-26 ENCOUNTER — Other Ambulatory Visit (HOSPITAL_COMMUNITY): Payer: Self-pay | Admitting: Psychiatry

## 2020-02-26 DIAGNOSIS — F411 Generalized anxiety disorder: Secondary | ICD-10-CM

## 2020-02-26 DIAGNOSIS — G20 Parkinson's disease: Secondary | ICD-10-CM

## 2020-02-26 DIAGNOSIS — F332 Major depressive disorder, recurrent severe without psychotic features: Secondary | ICD-10-CM

## 2020-03-03 ENCOUNTER — Encounter (HOSPITAL_COMMUNITY): Payer: Self-pay | Admitting: Psychiatry

## 2020-03-03 ENCOUNTER — Telehealth (INDEPENDENT_AMBULATORY_CARE_PROVIDER_SITE_OTHER): Payer: Medicare Other | Admitting: Psychiatry

## 2020-03-03 ENCOUNTER — Other Ambulatory Visit: Payer: Self-pay

## 2020-03-03 DIAGNOSIS — F332 Major depressive disorder, recurrent severe without psychotic features: Secondary | ICD-10-CM | POA: Diagnosis not present

## 2020-03-03 DIAGNOSIS — G20 Parkinson's disease: Secondary | ICD-10-CM | POA: Diagnosis not present

## 2020-03-03 DIAGNOSIS — F411 Generalized anxiety disorder: Secondary | ICD-10-CM

## 2020-03-03 MED ORDER — BUPROPION HCL ER (XL) 150 MG PO TB24: 150.0000 mg | ORAL_TABLET | Freq: Every day | ORAL | 0 refills | Status: DC

## 2020-03-03 MED ORDER — CITALOPRAM HYDROBROMIDE 40 MG PO TABS: 40.0000 mg | ORAL_TABLET | Freq: Every day | ORAL | 0 refills | Status: DC

## 2020-03-03 MED ORDER — BUPROPION HCL ER (XL) 300 MG PO TB24: 300.0000 mg | ORAL_TABLET | ORAL | 0 refills | Status: DC

## 2020-03-03 MED ORDER — OLANZAPINE 2.5 MG PO TABS: 2.5000 mg | ORAL_TABLET | Freq: Every day | ORAL | 0 refills | Status: DC

## 2020-03-03 NOTE — Progress Notes (Signed)
Virtual Visit via Telephone Note  I connected with [name] [name] on 03/03/20 at 11:00 AM EDT by telephone and verified that I am speaking with the correct person using two identifiers.   I discussed the limitations, risks, security and privacy concerns of performing an evaluation and management service by telephone and the availability of in person appointments. I also discussed with the patient that there may be a patient responsible charge related to this service. The patient expressed understanding and agreed to proceed. Patient location; home Provider location; home office  History of Present Illness: Patient is evaluated by phone session.  His wife is also present and provided information about him.  He has been doing okay.  He feels olanzapine is helping his psychosis, delusions and hallucinations.  He recently had a visit to neurology and there has been no changes in the dose but he is taking Parkinson medicine 4 times a day.  He has tremors but they are stable.  He is able to hold his fork but does require some assistance to help walking.  He denies any crying spells or any feeling of hopelessness.  He denies any anger, irritability.  His appetite is okay.  His weight is stable.  Patient and his wife does not want to change the medication since it is working well.  He denies any panic attack or any anxiety or nervousness.   Past Psychiatric History:Reviewed. 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 Seeing in this office since September 2009. Saw multiple psychiatrists in the past.  Psychiatric Specialty Exam: Physical Exam  Review of Systems  There were no vitals taken for this visit.There is no height or weight on file to calculate BMI.  General Appearance: NA  Eye  Contact:  NA  Speech:  Slow  Volume:  Decreased  Mood:  Euthymic  Affect:  NA  Thought Process:  Descriptions of Associations: Intact  Orientation:  Full (Time, Place, and Person)  Thought Content:  Logical and slow  Suicidal Thoughts:  No  Homicidal Thoughts:  No  Memory:  Immediate;   Fair Recent;   Fair Remote;   Fair  Judgement:  Intact  Insight:  Present  Psychomotor Activity:  Decreased  Concentration:  Concentration: Fair and Attention Span: Fair  Recall:  [employer] of Knowledge:  Fair  Language:  Good  Akathisia:  NA  Handed:  Right  AIMS (if indicated):     Assets:  Communication Skills Desire for Improvement Housing Social Support  ADL's:  Impaired need help to walk  Cognition:  Impaired,  Mild  Sleep:   ok      Assessment and Plan: Major depressive disorder, recurrent.  Generalized anxiety disorder.  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  Patient is a stable on his current medication.  His PCP is Dr. Hardin Negus who does house calls.  He recently saw neurology I reviewed the notes.  Patient overall doing well on his current medication.  Patient and his wife does not feel there is olanzapine causing worsening of Parkinson symptoms.  I will continue low-dose olanzapine 2.5 mg at bedtime, Wellbutrin XL 300 mg in the morning and 150 at bedtime and Celexa 40 mg daily.  Discussed medication side effects and benefits.  Recommended to call us back if he has any questions or any concerns.  Follow-up in 3 months.  Follow Up Instructions:    I discussed the  assessment and treatment plan with the patient. The patient was provided an opportunity to ask questions and all were answered. The patient agreed with the plan and demonstrated an understanding of the instructions.   The patient was advised to call back or seek an in-person evaluation if the symptoms worsen or if the condition fails to improve as anticipated.  I provided 20 minutes of non-face-to-face time during  this encounter.   Kathlee Nations, MD

## 2020-03-28 DIAGNOSIS — F329 Major depressive disorder, single episode, unspecified: Secondary | ICD-10-CM | POA: Diagnosis not present

## 2020-03-28 DIAGNOSIS — N390 Urinary tract infection, site not specified: Secondary | ICD-10-CM | POA: Diagnosis not present

## 2020-03-28 DIAGNOSIS — L89159 Pressure ulcer of sacral region, unspecified stage: Secondary | ICD-10-CM | POA: Diagnosis not present

## 2020-03-28 DIAGNOSIS — F4310 Post-traumatic stress disorder, unspecified: Secondary | ICD-10-CM | POA: Diagnosis not present

## 2020-04-09 ENCOUNTER — Telehealth: Payer: Self-pay | Admitting: Neurology

## 2020-04-09 NOTE — Telephone Encounter (Signed)
Spoke with pts wife who states pt has been getting progressively worse since carbidopa levodopa change 01/29/2020 (taking 2@ 8AM/11AM/2PM/1@ 5PM) but has gotten worse over last week. She says he's more confused (denies hallucinations) and "freezing up" more when ambulating. He saw his PCP a couple weeks ago but does not endorse urinalysis/labs being done. She says he is on a daily antibiotic prescribed by his urologist and she does not think he has a UTI. He cont to work with PT 3x/week. She denies stroke like symptoms. She thinks he needs to go back to his previous medication schedule of 2@ 7AM/11AM/4PM.   Spoke with Dr Tat who says it is okay to decrease carbidopa levodopa dose but she believes this will make his symptoms worse. She also says that he needs to be evaluated by PCP for UTI or other underlying infections that could be making his symptoms worse. Wife  states his PCP does house calls so she will call now to have someone come see pt. I also told her that if pt cont to experience a decline that he may need to be seen in the ED and she verbalized understanding.

## 2020-04-09 NOTE — Telephone Encounter (Signed)
Patient's wife called and said, "My husband has Parkinson's and he has gotten much worse over the last couple of days. He is basically non-ambulatory and cannot use his legs. He's also been having hallucinations and is acting like maybe he had a stroke but he is too young for that. I'm really scared."

## 2020-04-10 DIAGNOSIS — N390 Urinary tract infection, site not specified: Secondary | ICD-10-CM | POA: Diagnosis not present

## 2020-04-11 DIAGNOSIS — N390 Urinary tract infection, site not specified: Secondary | ICD-10-CM | POA: Diagnosis not present

## 2020-04-11 DIAGNOSIS — R443 Hallucinations, unspecified: Secondary | ICD-10-CM | POA: Diagnosis not present

## 2020-04-13 ENCOUNTER — Telehealth: Payer: Self-pay | Admitting: Neurology

## 2020-04-13 NOTE — Telephone Encounter (Signed)
Last week, pts wife called and insisted that we reduce levodopa (see 7/8 phone call).  I didn't want to do that and said that it would likely make sx's worse but said she could if she insisted.  Might need to go back up on that.  Re: hallucinations.  His hallucinations in the past have been part of psychotic depression (when he didn't have UTI) and not part of Parkinsons Disease.  He should also follow up with psychiatry because they also have him on meds that could potentially make his Parkinsons Disease worse (and his wife/pt are aware of that - that is the zyprexa/olanzapine)

## 2020-04-13 NOTE — Telephone Encounter (Signed)
Patient's daughter called in to let Dr. Carles [name] know that his UTI test came back negative. She is not convinced it isn't still a UTI.

## 2020-04-13 NOTE — Telephone Encounter (Signed)
Spoke with patients wife and she thinks the antiboitic is not working. (patient has been taking for a whole)   Wife thinks the sxs are the same as a UTI. Wife could not get patient out of bed yesterday.  Sxs include:  halluincations visual and auditory Unsteady on feet Weakness Tremors exacerbated  Stiffness is worse Confusion Foggy brain No appetite Forcing fluids  night terrors are worse when patient is sick Unable to walk Frequent urination  Wife thinks pateint has UTI. And that maybe [facility] did not test for all possible bacteria.   Takes probiotic daily.   Will contact Dr McDermick (urologist) to do virtual visit but he is on vacation until next week.  Wife is very worried and concerned. Wants to know why would antibiotic work if there was no infection.

## 2020-04-14 NOTE — Telephone Encounter (Signed)
Patients wife thinks the patient has an infection. Tried to explain to patient that this is not a neurological issue or has anything to the patients parkinson disease.   Maggie states she was just calling to make Dr Tat aware. Because the patients the urologist is not doing anything. She states she has also been in touch with the patients pcp.   She states UTI is common in patients with Parkinson's Disease.  Wife states she gives patient cranberry vitamins. And thins [facility] makes mistakes because her son worked there and he saw mistakes.   Informed her that this was a issue that would need to be addressed by urology or pcp. wife states she is about to wash her hands with urology and get a new urologist.

## 2020-05-08 ENCOUNTER — Telehealth (HOSPITAL_COMMUNITY): Payer: Self-pay | Admitting: *Deleted

## 2020-05-08 DIAGNOSIS — F332 Major depressive disorder, recurrent severe without psychotic features: Secondary | ICD-10-CM

## 2020-05-08 DIAGNOSIS — G20 Parkinson's disease: Secondary | ICD-10-CM

## 2020-05-08 NOTE — Telephone Encounter (Signed)
Patients wife called and stated patient was hallucinating badly. Requested that Zyprexa be increased from 2.5mg  to 5mg . Patient stated she had 5mg  tablets. Spoke with Dr. Montel Culver in Dr. Darletta Moll absence and he agreed to increase. Returned call and informed wife of permission to increase. Wife to call back 3 days before she runs out of medication.

## 2020-05-11 ENCOUNTER — Telehealth: Payer: Self-pay | Admitting: Neurology

## 2020-05-11 MED ORDER — OLANZAPINE 5 MG PO TABS: 5.0000 mg | ORAL_TABLET | Freq: Every day | ORAL | 0 refills | Status: DC

## 2020-05-11 NOTE — Telephone Encounter (Signed)
OK. I called [facility] in [place] the new script.Please let her know.

## 2020-05-11 NOTE — Telephone Encounter (Signed)
[name] [name] from [name] [name] was at appointment at patients home. States he's having trouble standing and wife reports his night terrors are getting worse. Caller is wanting to see if any medication changes can be made to help patient.

## 2020-05-12 NOTE — Telephone Encounter (Signed)
He has upcoming appt and we will discuss then as I really need to see him.  As we have discussed previously though his psych meds (zyprexa/olanzapine) can contribute to making parkinsonism worse and there is nothing unfortunately that I can do about that.

## 2020-05-12 NOTE — Telephone Encounter (Signed)
Left VM for Almyra Free requesting call back.

## 2020-05-12 NOTE — Telephone Encounter (Signed)
Spoke with Almyra Free who sees pt at home through [facility]. Told her that Dr Tat needs to see pt before she can recommend any changes in treatment, that his psychiatric medications are likely a factor in Parkinsonism symptoms. She verbalizes understanding. She states pt will be discussed on Thur for appropriateness of changing pt over to hospice since he's getting progressively weaker and declining overall, she says wife is agreeable since it would provide more services for pt. Told her I'd update Dr Tat.

## 2020-05-12 NOTE — Telephone Encounter (Signed)
[name] [name] from [facility] called back to ask if patient could be seen sooner. I have added patient to waitlist.

## 2020-05-18 NOTE — Progress Notes (Signed)
Virtual Visit Via Video   The purpose of this virtual visit is to provide medical care while limiting exposure to the novel coronavirus.    Consent was obtained for video visit:  Yes.   Answered questions that patient had about telehealth interaction:  Yes.   I discussed the limitations, risks, security and privacy concerns of performing an evaluation and management service by telemedicine. I also discussed with the patient that there may be a patient responsible charge related to this service. The patient expressed understanding and agreed to proceed.  Pt location: Home Physician Location: office Name of referring provider:  Clovia Cuff, MD I connected with [name] [name] at patients initiation/request on 05/21/2020 at  9:45 AM EDT by video enabled telemedicine application and verified that I am speaking with the correct person using two identifiers. Pt MRN:  [id number] Pt DOB:  07/07/1951 Video Participants:  [name] [name];    Assessment/Plan:   1.  Parkinsonism             -continue carbidopa/levodopa 25/100  2 at 8am/2 at 11am/2 at 2pm/1 at 5pm             -add carbidopa/levodopa 50/200 at bedtime.  Did tell them to wait 1 to 2 weeks to start this (as they are making med changes with psychiatry today) 2.  Depression, with history of depression related psychosis             -On olanzapine.    I do think that the increase in olanzapine is what caused the sleepiness and probably increased his trouble walking, as antipsychotics make parkinsonism worse.  We discussed this in detail today.  The olanzapine was discontinued yesterday (they have not yet done this) and changed to Rexulti, but the same concerns exist with Rexulti.  Nonetheless, I discussed with patient and wife that the psychosis certainly needs to be under control, even if we are sacrificing motor control.  -I do think that the patient's wife has become overwhelmed with caregiving.  Resources have been provided to them  for respite care. 3.  Memory change             -Multiple neurocognitive testing in the past.  Had last outpatient neurocognitive testing in December, 2017, and a shorter evaluation in March, 2020 in the hospital.  None of these had indicated dementia. 4.  Constipation  -discussed nature and pathophysiology and association with PD  -discussed importance of hydration.    -recommended daily colace  -recommended miralax prn   Subjective   Patient worked in today for his Parkinson's disease.  I have received numerous phone calls from the patient's wife as well as hospice.  Patient not able to move like he was.  Initially, his wife thought it was from urinary tract infection, despite negative UA.  We had asked wife and hospice nurse to have them contact psychiatry, as the olanzapine can certainly make parkinsonism worse.  I do see that they contacted psychiatry, but not in regards to my concerns.  In fact, on August 9, the patient's wife called psychiatry and stated that "patient was hallucinating badly.  Requested that Zyprexa be increased from 2.5 mg to 5 mg."  That was just done on August 9.  He was unable to get here for an appointment today.  Wife states that he is homebound and she can no longer get him out of the house.  Before his visit today, we called his house and his wife was  very upset stating that the patient was not alert and was unresponsive.  It was advised that she call 911, and she declined that.  It was therefore, advised that she either call 911 or consider hospice.  She states that he was deemed not a bad enough candidate for hospice and when we called back, she stated that the patient was alert and ready for the visit.  Pt states "I am not doing so well."  Wife having more "psychotic episodes."  He will be having a conversation with people who are not there.  He has more trouble getting up in the AM.  He has more trouble ambulating "like the feet are like lead."  Wife states that  yesterday they talked with psychiatry and were told to start rexulti but she hasn't gotten that yet.  Wife has caregiving 2 nights per week but nothing else during the day.  Merrily Pew is still coming 3 days per week from [facility] for 30 min.  Last week or 2, patient been very sleepy (more sleepy than usual) but generally more awake in the day.  They do think that this is from the olanzapine.  Current movement d/o meds:  Carbidopa/levodopa 25/100, 2 at 8 AM/2 at 11 AM/2 at 2 PM/1 at 5 PM (slight increase last visit) [name], 2.5 mg daily  Prior medications: Ropinirole; entacapone; levodopa   Current Outpatient Medications on File Prior to Visit  Medication Sig Dispense Refill  . B Complex-C (B-COMPLEX WITH VITAMIN C) tablet Take 1 tablet by mouth daily.    Marland Kitchen buPROPion (WELLBUTRIN XL) 150 MG 24 hr tablet Take 1 tablet (150 mg total) by mouth at bedtime. 90 tablet 0  . buPROPion (WELLBUTRIN XL) 300 MG 24 hr tablet Take 1 tablet (300 mg total) by mouth every morning. 90 tablet 0  . carbidopa-levodopa (SINEMET IR) 25-100 MG tablet 2 at 8am/2 at 11am/2 at 2pm/1 at 5pm 630 tablet 1  . citalopram (CELEXA) 40 MG tablet Take 1 tablet (40 mg total) by mouth daily. Dose adjusted for safety. Please discuss with prescribing physician. 90 tablet 0  . CRANBERRY PO Take 650 mg by mouth in the morning and at bedtime.     . hydrocortisone cream 1 % Apply 1 application topically 2 (two) times a week.    Marland Kitchen [name] (ZYPREXA) 5 MG tablet Take 1 tablet (5 mg total) by mouth at bedtime. 90 tablet 0  . Omega 3-6-9 Fatty Acids (OMEGA 3-6-9 PO) Take 1 capsule by mouth daily.     . TURMERIC CURCUMIN PO Take 1 capsule by mouth daily.     . Brexpiprazole (REXULTI) 0.5 MG TABS Take 1 tablet (0.5 mg total) by mouth daily. (Patient not taking: Reported on 05/21/2020) 30 tablet 0   No current facility-administered medications on file prior to visit.     Objective   Vitals:   05/21/20 0901  Weight: 295 lb (133.8 kg)    Height: 6\' 2"  (1.88 m)   GEN:  The patient appears stated age and is in NAD.  Follows commands appropriately.  Neurological examination:  Orientation: The patient is alert and oriented but he is somewhat somnolent. Cranial nerves: There is good facial symmetry. There is facial hypomimia.  The speech is fluent and clear. Soft palate rises symmetrically and there is no tongue deviation. Hearing is intact to conversational tone. Motor: Strength is at least antigravity x 4.   Shoulder shrug is equal and symmetric.  There is no pronator drift.  Movement  examination: Tone: unable Abnormal movements: None seen, but I really did not see the hands well at rest Coordination:  There is mild decremation with RAM's,  Gait and Station: not tested    Follow up Instructions      -I discussed the assessment and treatment plan with the patient. The patient was provided an opportunity to ask questions and all were answered. The patient agreed with the plan and demonstrated an understanding of the instructions.   The patient was advised to call back or seek an in-person evaluation if the symptoms worsen or if the condition fails to improve as anticipated.    Total time spent on today's visit was 30 minutes, including both face-to-face time and nonface-to-face time.  Time included that spent on review of records (prior notes available to me/labs/imaging if pertinent), discussing treatment and goals, answering patient's questions and coordinating care.   Alonza Bogus, DO

## 2020-05-20 ENCOUNTER — Telehealth (HOSPITAL_COMMUNITY): Payer: Self-pay | Admitting: *Deleted

## 2020-05-20 MED ORDER — REXULTI 0.5 MG PO TABS: 0.5000 mg | ORAL_TABLET | Freq: Every day | ORAL | 0 refills | Status: DC

## 2020-05-20 NOTE — Telephone Encounter (Signed)
Call returned.  Spoke to wife.  Unfortunately most of antipsychotic medication can cause worsening of tremors and Parkinson.  We will try Rexulti 0.5 mg to help the hallucinations.  I also told the wife to check with insurance if Arman Filter covers under his insurance.  If Rexulti did not help or caused worsening of Parkinson symptoms then we may need to switch Vraylar.  Discontinue olanzapine once he start Rexulti.

## 2020-05-20 NOTE — Telephone Encounter (Signed)
Patients wife called and stated that patients hallucinations and tremors are worsening and that she doesn't think the [name] is working. Please review and advise.

## 2020-05-21 ENCOUNTER — Telehealth: Payer: Self-pay

## 2020-05-21 ENCOUNTER — Telehealth (HOSPITAL_COMMUNITY): Payer: Self-pay | Admitting: *Deleted

## 2020-05-21 ENCOUNTER — Encounter: Payer: Self-pay | Admitting: Neurology

## 2020-05-21 ENCOUNTER — Telehealth (INDEPENDENT_AMBULATORY_CARE_PROVIDER_SITE_OTHER): Payer: Medicare Other | Admitting: Neurology

## 2020-05-21 ENCOUNTER — Other Ambulatory Visit: Payer: Self-pay

## 2020-05-21 VITALS — Ht 74.0 in | Wt 295.0 lb

## 2020-05-21 DIAGNOSIS — G20 Parkinson's disease: Secondary | ICD-10-CM | POA: Diagnosis not present

## 2020-05-21 DIAGNOSIS — F29 Unspecified psychosis not due to a substance or known physiological condition: Secondary | ICD-10-CM | POA: Diagnosis not present

## 2020-05-21 DIAGNOSIS — R441 Visual hallucinations: Secondary | ICD-10-CM | POA: Diagnosis not present

## 2020-05-21 MED ORDER — CARBIDOPA-LEVODOPA ER 50-200 MG PO TBCR: 1.0000 | EXTENDED_RELEASE_TABLET | Freq: Every day | ORAL | 1 refills | Status: AC

## 2020-05-21 NOTE — Telephone Encounter (Signed)
Patients wife called and asked if patient could just stop Zyprexa and start Rexulti without a taper process. Please advise.

## 2020-05-21 NOTE — Telephone Encounter (Signed)
Spoke with patients wife and she states she has reached out to hospice. She states the patient does not qualify for hospice but will start palliative care.  And wife requested to do virtual visit patient is alert.

## 2020-05-21 NOTE — Telephone Encounter (Signed)
Contacted patient to start work up for virtual visit today at 9:45am.  Wife stated patient was not alert and it too her about thirty minutes to wake up patient. She states the patient is not well and she thinks he is dying. She states her husband is having hallucinations and can not walk. She states she does not know what to do.   I advised wife to contact 911 is the patient is not alert. She states the patient does not want her to call 911. She states she does not want to take the patient to the hospital because that is a death sentence and covid is bad at the hospital.   Spoke with Dr Tat who stated the patient has two options take the patient to the emergency room or consider hospice.

## 2020-05-21 NOTE — Telephone Encounter (Signed)
Ok. She can stopped [name] and continue Rexulti

## 2020-05-22 ENCOUNTER — Telehealth (HOSPITAL_COMMUNITY): Payer: Self-pay | Admitting: *Deleted

## 2020-05-22 NOTE — Telephone Encounter (Signed)
She need to call insurance to ask what antipsychotic meds are covered.

## 2020-05-22 NOTE — Telephone Encounter (Signed)
Writer spoke with pt wife, "[name] [name]", regarding cost of Rexulti and calling her insurance company to see what antipsychotics are covered. [name] verbalizes understanding and will f/u she says, but now also feels that the Zyprexa 5mg  is working now. FYI.

## 2020-05-22 NOTE — Telephone Encounter (Signed)
ok 

## 2020-05-22 NOTE — Telephone Encounter (Signed)
Pt wife called stating that the Rexulti is $400 and they cannot afford. Please advise.

## 2020-05-26 ENCOUNTER — Other Ambulatory Visit (HOSPITAL_COMMUNITY): Payer: Self-pay | Admitting: Psychiatry

## 2020-05-26 DIAGNOSIS — F332 Major depressive disorder, recurrent severe without psychotic features: Secondary | ICD-10-CM

## 2020-05-26 DIAGNOSIS — F411 Generalized anxiety disorder: Secondary | ICD-10-CM

## 2020-05-27 DIAGNOSIS — Z792 Long term (current) use of antibiotics: Secondary | ICD-10-CM | POA: Diagnosis not present

## 2020-05-27 DIAGNOSIS — G20 Parkinson's disease: Secondary | ICD-10-CM | POA: Diagnosis not present

## 2020-05-27 DIAGNOSIS — R441 Visual hallucinations: Secondary | ICD-10-CM | POA: Diagnosis not present

## 2020-05-27 DIAGNOSIS — F900 Attention-deficit hyperactivity disorder, predominantly inattentive type: Secondary | ICD-10-CM | POA: Diagnosis not present

## 2020-05-27 DIAGNOSIS — Z87440 Personal history of urinary (tract) infections: Secondary | ICD-10-CM | POA: Diagnosis not present

## 2020-05-27 DIAGNOSIS — R1310 Dysphagia, unspecified: Secondary | ICD-10-CM | POA: Diagnosis not present

## 2020-05-27 DIAGNOSIS — F4310 Post-traumatic stress disorder, unspecified: Secondary | ICD-10-CM | POA: Diagnosis not present

## 2020-05-27 DIAGNOSIS — F329 Major depressive disorder, single episode, unspecified: Secondary | ICD-10-CM | POA: Diagnosis not present

## 2020-05-27 DIAGNOSIS — Z9181 History of falling: Secondary | ICD-10-CM | POA: Diagnosis not present

## 2020-05-27 DIAGNOSIS — I872 Venous insufficiency (chronic) (peripheral): Secondary | ICD-10-CM | POA: Diagnosis not present

## 2020-05-27 DIAGNOSIS — K5904 Chronic idiopathic constipation: Secondary | ICD-10-CM | POA: Diagnosis not present

## 2020-05-28 ENCOUNTER — Other Ambulatory Visit (HOSPITAL_COMMUNITY): Payer: Self-pay | Admitting: *Deleted

## 2020-05-28 MED ORDER — REXULTI 0.5 MG PO TABS: 0.5000 mg | ORAL_TABLET | Freq: Every day | ORAL | 0 refills | Status: DC

## 2020-05-30 DIAGNOSIS — G20 Parkinson's disease: Secondary | ICD-10-CM | POA: Diagnosis not present

## 2020-05-30 DIAGNOSIS — M6281 Muscle weakness (generalized): Secondary | ICD-10-CM | POA: Diagnosis not present

## 2020-05-30 DIAGNOSIS — R441 Visual hallucinations: Secondary | ICD-10-CM | POA: Diagnosis not present

## 2020-05-30 DIAGNOSIS — N390 Urinary tract infection, site not specified: Secondary | ICD-10-CM | POA: Diagnosis not present

## 2020-06-01 DIAGNOSIS — F900 Attention-deficit hyperactivity disorder, predominantly inattentive type: Secondary | ICD-10-CM | POA: Diagnosis not present

## 2020-06-01 DIAGNOSIS — R1310 Dysphagia, unspecified: Secondary | ICD-10-CM | POA: Diagnosis not present

## 2020-06-01 DIAGNOSIS — I872 Venous insufficiency (chronic) (peripheral): Secondary | ICD-10-CM | POA: Diagnosis not present

## 2020-06-01 DIAGNOSIS — F329 Major depressive disorder, single episode, unspecified: Secondary | ICD-10-CM | POA: Diagnosis not present

## 2020-06-01 DIAGNOSIS — G20 Parkinson's disease: Secondary | ICD-10-CM | POA: Diagnosis not present

## 2020-06-01 DIAGNOSIS — R441 Visual hallucinations: Secondary | ICD-10-CM | POA: Diagnosis not present

## 2020-06-03 ENCOUNTER — Telehealth (HOSPITAL_COMMUNITY): Payer: Self-pay | Admitting: *Deleted

## 2020-06-03 ENCOUNTER — Other Ambulatory Visit: Payer: Self-pay

## 2020-06-03 ENCOUNTER — Telehealth (INDEPENDENT_AMBULATORY_CARE_PROVIDER_SITE_OTHER): Payer: Medicare Other | Admitting: Psychiatry

## 2020-06-03 ENCOUNTER — Encounter (HOSPITAL_COMMUNITY): Payer: Self-pay | Admitting: Psychiatry

## 2020-06-03 DIAGNOSIS — F411 Generalized anxiety disorder: Secondary | ICD-10-CM | POA: Diagnosis not present

## 2020-06-03 DIAGNOSIS — I872 Venous insufficiency (chronic) (peripheral): Secondary | ICD-10-CM | POA: Diagnosis not present

## 2020-06-03 DIAGNOSIS — F900 Attention-deficit hyperactivity disorder, predominantly inattentive type: Secondary | ICD-10-CM | POA: Diagnosis not present

## 2020-06-03 DIAGNOSIS — F332 Major depressive disorder, recurrent severe without psychotic features: Secondary | ICD-10-CM

## 2020-06-03 DIAGNOSIS — R1310 Dysphagia, unspecified: Secondary | ICD-10-CM | POA: Diagnosis not present

## 2020-06-03 DIAGNOSIS — F329 Major depressive disorder, single episode, unspecified: Secondary | ICD-10-CM | POA: Diagnosis not present

## 2020-06-03 DIAGNOSIS — G20 Parkinson's disease: Secondary | ICD-10-CM | POA: Diagnosis not present

## 2020-06-03 DIAGNOSIS — R441 Visual hallucinations: Secondary | ICD-10-CM | POA: Diagnosis not present

## 2020-06-03 MED ORDER — BUPROPION HCL ER (XL) 300 MG PO TB24: 300.0000 mg | ORAL_TABLET | ORAL | 0 refills | Status: DC

## 2020-06-03 MED ORDER — BUPROPION HCL ER (XL) 150 MG PO TB24: 150.0000 mg | ORAL_TABLET | Freq: Every day | ORAL | 0 refills | Status: DC

## 2020-06-03 MED ORDER — CITALOPRAM HYDROBROMIDE 40 MG PO TABS: 40.0000 mg | ORAL_TABLET | Freq: Every day | ORAL | 0 refills | Status: DC

## 2020-06-03 NOTE — Telephone Encounter (Signed)
Pt's wife, [name], in clinic today to pick up samples of Rexulti per her conversation with Dr. Adele Schilder. Maggie was provided with one dose pack with 7 0.5mg  and 7 1mg , as well as 2 boxes ( 7 tabs each ) of the 1mg . Nurse wrote down instructions for administration and gave verbal instructions to reinforce. Pt encouraged to call nurse with any questions/issues. Mrs. Marquess verbalized understanding.

## 2020-06-03 NOTE — [place] Notes (Signed)
Virtual Visit via Telephone Note  I connected with [name] [name] on 06/03/20 at 11:00 AM EDT by telephone and verified that I am speaking with the correct person using two identifiers.  Location: Patient: home Provider: home office   I discussed the limitations, risks, security and privacy concerns of performing an evaluation and management service by telephone and the availability of in person appointments. I also discussed with the patient that there may be a patient responsible charge related to this service. The patient expressed understanding and agreed to proceed.   History of Present Illness: Patient is evaluated by phone session.  I spoke to his wife [name] and her daughter [name] since they provide more information.  As per wife patient is still struggling with hallucination and delusion.  Mostly these hallucinations are visual and sometimes he see bubbles and shadows.  Recently neurologist has increased carbidopa and hallucinations are getting worse.  Patient's wife called few times last week requesting to initially increase olanzapine but that did not work and we recommend to switch to [place].  Patient had called prescription but since it is expensive she is not picked up yet.  Patient's wife appear frustrated as she is a primary caretaker other than 2 days when she get home health aide.  She is concerned because patient general condition is getting deteriorated.  He had dizziness and some time fall.  Last week he fell but likely no injury.  His gait is unsteady.  He requires assistant helping walking.  He has tremors.  Patient denies any suicidal thoughts or any auditory hallucination.  We have talked about nursing home/assisted living facility but patient and his family does not want.  He is taking Celexa and Wellbutrin.   Past Psychiatric History:Reviewed. 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 [place] psychiatrists in the past.   Psychiatric Specialty Exam: Physical Exam  Review of Systems  There were no vitals taken for this visit.There is no height or weight on file to calculate BMI.  General Appearance: NA  Eye Contact:  NA  Speech:  Slow  Volume:  Decreased  Mood:  Dysphoric  Affect:  NA  Thought Process:  Descriptions of Associations: Intact  Orientation:  Full (Time, Place, and Person)  Thought Content:  Hallucinations: Visual  Suicidal Thoughts:  No  Homicidal Thoughts:  No  Memory:  Immediate;   Fair Recent;   Fair Remote;   Fair  Judgement:  Intact  Insight:  Shallow  Psychomotor Activity:  NA  Concentration:  Concentration: Fair and Attention Span: Fair  Recall:  [employer] of Knowledge:  Fair  Language:  Fair  Akathisia:  No  Handed:  Right  AIMS (if indicated):     Assets:  Housing Social Support  ADL's:  Impaired  Cognition:  Impaired,  Moderate  Sleep:   fair      Assessment and Plan: Major depressive disorder, recurrent.  Generalized anxiety disorder.  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  I had a long discussion with the patient's wife regarding a fine balance between Parkinson medicine and psychosis.  I explained adjusting Parkinson medicine can cause worsening of psychosis and the same if we increase psychosis medicine it cause worsening of Parkinson.  Patient wife understand but currently she feels her psychosis is worsening.  He is having visual hallucination which could be due to increase carbidopa recently.  Patient is waiting REXULTI and still taking olanzapine 5 mg which patient feels does not help his hallucination.  I recommend that he should try Rexulti 0.5 mg samples first before he order expensive medicine.  I explained cut down the olanzapine to  2.5 mg only at bedtime once he starts the Rexulti 0.5 mg and after 1 week he can take Rexulti 1 mg and stopped olanzapine.  She will observe his hallucinations if Rexulti had help the visual hallucinations.  Unfortunately most of the antipsychotic medication can cause worsening of Parkinson.  Patient's wife will come and pick up the samples.  He will continue Wellbutrin XL 300 mg in the morning, 150 at bedtime and Celexa 40 mg daily.  I recommend to call us back if Rexulti did not work then we will consider Seroquel but unfortunately it caused postural hypotension.  Follow-up in 6 weeks.  Follow Up Instructions:    I discussed the assessment and treatment plan with the patient. The patient was provided an opportunity to ask questions and all were answered. The patient agreed with the plan and demonstrated an understanding of the instructions.   The patient was advised to call back or seek an in-person evaluation if the symptoms worsen or if the condition fails to improve as anticipated.  I provided 25 minutes of non-face-to-face time during this encounter.   Kathlee Nations, MD

## 2020-06-05 ENCOUNTER — Telehealth (HOSPITAL_COMMUNITY): Payer: Self-pay | Admitting: *Deleted

## 2020-06-05 NOTE — Telephone Encounter (Signed)
Writer returned pt call regarding [name] [name]. [name] [name] left message sounding distraught after Rx filled for #90 of the Rexulti 0.5mg . prescription cost $125 and she was worried he would run out before next appointment on 07/15/20. This nurse advised her to give pt 2 of the 0.5mg  until out of medication, then use the 1 mg samples writer gave her on 06/03/20 ( 3 weeks worth ) and this should get pt to next appointment. Writer encouraged wife to call if needing more samples or with any questions or concerns. [name] [name] verbalizes understanding.

## 2020-06-10 ENCOUNTER — Telehealth: Payer: Medicare Other | Admitting: Neurology

## 2020-06-10 DIAGNOSIS — G20 Parkinson's disease: Secondary | ICD-10-CM | POA: Diagnosis not present

## 2020-06-10 DIAGNOSIS — F900 Attention-deficit hyperactivity disorder, predominantly inattentive type: Secondary | ICD-10-CM | POA: Diagnosis not present

## 2020-06-10 DIAGNOSIS — R441 Visual hallucinations: Secondary | ICD-10-CM | POA: Diagnosis not present

## 2020-06-10 DIAGNOSIS — F329 Major depressive disorder, single episode, unspecified: Secondary | ICD-10-CM | POA: Diagnosis not present

## 2020-06-10 DIAGNOSIS — I872 Venous insufficiency (chronic) (peripheral): Secondary | ICD-10-CM | POA: Diagnosis not present

## 2020-06-10 DIAGNOSIS — R1310 Dysphagia, unspecified: Secondary | ICD-10-CM | POA: Diagnosis not present

## 2020-06-11 DIAGNOSIS — Z87440 Personal history of urinary (tract) infections: Secondary | ICD-10-CM | POA: Diagnosis not present

## 2020-06-11 DIAGNOSIS — G20 Parkinson's disease: Secondary | ICD-10-CM | POA: Diagnosis not present

## 2020-06-11 DIAGNOSIS — R0902 Hypoxemia: Secondary | ICD-10-CM | POA: Diagnosis not present

## 2020-06-11 DIAGNOSIS — R634 Abnormal weight loss: Secondary | ICD-10-CM | POA: Diagnosis not present

## 2020-06-11 DIAGNOSIS — F329 Major depressive disorder, single episode, unspecified: Secondary | ICD-10-CM | POA: Diagnosis not present

## 2020-06-11 DIAGNOSIS — R410 Disorientation, unspecified: Secondary | ICD-10-CM | POA: Diagnosis not present

## 2020-06-11 DIAGNOSIS — R1310 Dysphagia, unspecified: Secondary | ICD-10-CM | POA: Diagnosis not present

## 2020-06-13 DIAGNOSIS — F329 Major depressive disorder, single episode, unspecified: Secondary | ICD-10-CM | POA: Diagnosis not present

## 2020-06-13 DIAGNOSIS — R0902 Hypoxemia: Secondary | ICD-10-CM | POA: Diagnosis not present

## 2020-06-13 DIAGNOSIS — R634 Abnormal weight loss: Secondary | ICD-10-CM | POA: Diagnosis not present

## 2020-06-13 DIAGNOSIS — R1310 Dysphagia, unspecified: Secondary | ICD-10-CM | POA: Diagnosis not present

## 2020-06-13 DIAGNOSIS — R410 Disorientation, unspecified: Secondary | ICD-10-CM | POA: Diagnosis not present

## 2020-06-13 DIAGNOSIS — G20 Parkinson's disease: Secondary | ICD-10-CM | POA: Diagnosis not present

## 2020-06-15 DIAGNOSIS — R0902 Hypoxemia: Secondary | ICD-10-CM | POA: Diagnosis not present

## 2020-06-15 DIAGNOSIS — G20 Parkinson's disease: Secondary | ICD-10-CM | POA: Diagnosis not present

## 2020-06-15 DIAGNOSIS — F329 Major depressive disorder, single episode, unspecified: Secondary | ICD-10-CM | POA: Diagnosis not present

## 2020-06-15 DIAGNOSIS — R634 Abnormal weight loss: Secondary | ICD-10-CM | POA: Diagnosis not present

## 2020-06-15 DIAGNOSIS — R410 Disorientation, unspecified: Secondary | ICD-10-CM | POA: Diagnosis not present

## 2020-06-15 DIAGNOSIS — R1310 Dysphagia, unspecified: Secondary | ICD-10-CM | POA: Diagnosis not present

## 2020-06-16 DIAGNOSIS — F329 Major depressive disorder, single episode, unspecified: Secondary | ICD-10-CM | POA: Diagnosis not present

## 2020-06-16 DIAGNOSIS — R634 Abnormal weight loss: Secondary | ICD-10-CM | POA: Diagnosis not present

## 2020-06-16 DIAGNOSIS — G20 Parkinson's disease: Secondary | ICD-10-CM | POA: Diagnosis not present

## 2020-06-16 DIAGNOSIS — R1310 Dysphagia, unspecified: Secondary | ICD-10-CM | POA: Diagnosis not present

## 2020-06-16 DIAGNOSIS — R410 Disorientation, unspecified: Secondary | ICD-10-CM | POA: Diagnosis not present

## 2020-06-16 DIAGNOSIS — R0902 Hypoxemia: Secondary | ICD-10-CM | POA: Diagnosis not present

## 2020-06-17 DIAGNOSIS — G20 Parkinson's disease: Secondary | ICD-10-CM | POA: Diagnosis not present

## 2020-06-17 DIAGNOSIS — F329 Major depressive disorder, single episode, unspecified: Secondary | ICD-10-CM | POA: Diagnosis not present

## 2020-06-17 DIAGNOSIS — R0902 Hypoxemia: Secondary | ICD-10-CM | POA: Diagnosis not present

## 2020-06-17 DIAGNOSIS — R1310 Dysphagia, unspecified: Secondary | ICD-10-CM | POA: Diagnosis not present

## 2020-06-17 DIAGNOSIS — R410 Disorientation, unspecified: Secondary | ICD-10-CM | POA: Diagnosis not present

## 2020-06-17 DIAGNOSIS — R634 Abnormal weight loss: Secondary | ICD-10-CM | POA: Diagnosis not present

## 2020-06-19 DIAGNOSIS — F329 Major depressive disorder, single episode, unspecified: Secondary | ICD-10-CM | POA: Diagnosis not present

## 2020-06-19 DIAGNOSIS — R410 Disorientation, unspecified: Secondary | ICD-10-CM | POA: Diagnosis not present

## 2020-06-19 DIAGNOSIS — R0902 Hypoxemia: Secondary | ICD-10-CM | POA: Diagnosis not present

## 2020-06-19 DIAGNOSIS — G20 Parkinson's disease: Secondary | ICD-10-CM | POA: Diagnosis not present

## 2020-06-19 DIAGNOSIS — R634 Abnormal weight loss: Secondary | ICD-10-CM | POA: Diagnosis not present

## 2020-06-19 DIAGNOSIS — R1310 Dysphagia, unspecified: Secondary | ICD-10-CM | POA: Diagnosis not present

## 2020-06-20 DIAGNOSIS — G20 Parkinson's disease: Secondary | ICD-10-CM | POA: Diagnosis not present

## 2020-06-20 DIAGNOSIS — R1310 Dysphagia, unspecified: Secondary | ICD-10-CM | POA: Diagnosis not present

## 2020-06-20 DIAGNOSIS — F329 Major depressive disorder, single episode, unspecified: Secondary | ICD-10-CM | POA: Diagnosis not present

## 2020-06-20 DIAGNOSIS — R634 Abnormal weight loss: Secondary | ICD-10-CM | POA: Diagnosis not present

## 2020-06-20 DIAGNOSIS — R0902 Hypoxemia: Secondary | ICD-10-CM | POA: Diagnosis not present

## 2020-06-20 DIAGNOSIS — R410 Disorientation, unspecified: Secondary | ICD-10-CM | POA: Diagnosis not present

## 2020-06-22 DIAGNOSIS — R1310 Dysphagia, unspecified: Secondary | ICD-10-CM | POA: Diagnosis not present

## 2020-06-22 DIAGNOSIS — R410 Disorientation, unspecified: Secondary | ICD-10-CM | POA: Diagnosis not present

## 2020-06-22 DIAGNOSIS — G20 Parkinson's disease: Secondary | ICD-10-CM | POA: Diagnosis not present

## 2020-06-22 DIAGNOSIS — R634 Abnormal weight loss: Secondary | ICD-10-CM | POA: Diagnosis not present

## 2020-06-22 DIAGNOSIS — F329 Major depressive disorder, single episode, unspecified: Secondary | ICD-10-CM | POA: Diagnosis not present

## 2020-06-22 DIAGNOSIS — R0902 Hypoxemia: Secondary | ICD-10-CM | POA: Diagnosis not present

## 2020-06-24 DIAGNOSIS — R0902 Hypoxemia: Secondary | ICD-10-CM | POA: Diagnosis not present

## 2020-06-24 DIAGNOSIS — R1310 Dysphagia, unspecified: Secondary | ICD-10-CM | POA: Diagnosis not present

## 2020-06-24 DIAGNOSIS — R634 Abnormal weight loss: Secondary | ICD-10-CM | POA: Diagnosis not present

## 2020-06-24 DIAGNOSIS — F329 Major depressive disorder, single episode, unspecified: Secondary | ICD-10-CM | POA: Diagnosis not present

## 2020-06-24 DIAGNOSIS — G20 Parkinson's disease: Secondary | ICD-10-CM | POA: Diagnosis not present

## 2020-06-24 DIAGNOSIS — R410 Disorientation, unspecified: Secondary | ICD-10-CM | POA: Diagnosis not present

## 2020-06-26 DIAGNOSIS — R1310 Dysphagia, unspecified: Secondary | ICD-10-CM | POA: Diagnosis not present

## 2020-06-26 DIAGNOSIS — R0902 Hypoxemia: Secondary | ICD-10-CM | POA: Diagnosis not present

## 2020-06-26 DIAGNOSIS — F329 Major depressive disorder, single episode, unspecified: Secondary | ICD-10-CM | POA: Diagnosis not present

## 2020-06-26 DIAGNOSIS — G20 Parkinson's disease: Secondary | ICD-10-CM | POA: Diagnosis not present

## 2020-06-26 DIAGNOSIS — R410 Disorientation, unspecified: Secondary | ICD-10-CM | POA: Diagnosis not present

## 2020-06-26 DIAGNOSIS — R634 Abnormal weight loss: Secondary | ICD-10-CM | POA: Diagnosis not present

## 2020-06-27 DIAGNOSIS — R634 Abnormal weight loss: Secondary | ICD-10-CM | POA: Diagnosis not present

## 2020-06-27 DIAGNOSIS — F329 Major depressive disorder, single episode, unspecified: Secondary | ICD-10-CM | POA: Diagnosis not present

## 2020-06-27 DIAGNOSIS — G20 Parkinson's disease: Secondary | ICD-10-CM | POA: Diagnosis not present

## 2020-06-27 DIAGNOSIS — R0902 Hypoxemia: Secondary | ICD-10-CM | POA: Diagnosis not present

## 2020-06-27 DIAGNOSIS — R410 Disorientation, unspecified: Secondary | ICD-10-CM | POA: Diagnosis not present

## 2020-06-27 DIAGNOSIS — R1310 Dysphagia, unspecified: Secondary | ICD-10-CM | POA: Diagnosis not present

## 2020-06-29 ENCOUNTER — Other Ambulatory Visit: Payer: Self-pay | Admitting: Neurology

## 2020-06-29 DIAGNOSIS — R0902 Hypoxemia: Secondary | ICD-10-CM | POA: Diagnosis not present

## 2020-06-29 DIAGNOSIS — R634 Abnormal weight loss: Secondary | ICD-10-CM | POA: Diagnosis not present

## 2020-06-29 DIAGNOSIS — G20 Parkinson's disease: Secondary | ICD-10-CM | POA: Diagnosis not present

## 2020-06-29 DIAGNOSIS — R410 Disorientation, unspecified: Secondary | ICD-10-CM | POA: Diagnosis not present

## 2020-06-29 DIAGNOSIS — F329 Major depressive disorder, single episode, unspecified: Secondary | ICD-10-CM | POA: Diagnosis not present

## 2020-06-29 DIAGNOSIS — R1310 Dysphagia, unspecified: Secondary | ICD-10-CM | POA: Diagnosis not present

## 2020-06-30 DIAGNOSIS — R634 Abnormal weight loss: Secondary | ICD-10-CM | POA: Diagnosis not present

## 2020-06-30 DIAGNOSIS — F329 Major depressive disorder, single episode, unspecified: Secondary | ICD-10-CM | POA: Diagnosis not present

## 2020-06-30 DIAGNOSIS — R1310 Dysphagia, unspecified: Secondary | ICD-10-CM | POA: Diagnosis not present

## 2020-06-30 DIAGNOSIS — R0902 Hypoxemia: Secondary | ICD-10-CM | POA: Diagnosis not present

## 2020-06-30 DIAGNOSIS — R410 Disorientation, unspecified: Secondary | ICD-10-CM | POA: Diagnosis not present

## 2020-06-30 DIAGNOSIS — G20 Parkinson's disease: Secondary | ICD-10-CM | POA: Diagnosis not present

## 2020-07-01 DIAGNOSIS — R634 Abnormal weight loss: Secondary | ICD-10-CM | POA: Diagnosis not present

## 2020-07-01 DIAGNOSIS — F329 Major depressive disorder, single episode, unspecified: Secondary | ICD-10-CM | POA: Diagnosis not present

## 2020-07-01 DIAGNOSIS — G20 Parkinson's disease: Secondary | ICD-10-CM | POA: Diagnosis not present

## 2020-07-01 DIAGNOSIS — R410 Disorientation, unspecified: Secondary | ICD-10-CM | POA: Diagnosis not present

## 2020-07-01 DIAGNOSIS — R1310 Dysphagia, unspecified: Secondary | ICD-10-CM | POA: Diagnosis not present

## 2020-07-01 DIAGNOSIS — R0902 Hypoxemia: Secondary | ICD-10-CM | POA: Diagnosis not present

## 2020-07-02 DIAGNOSIS — R0902 Hypoxemia: Secondary | ICD-10-CM | POA: Diagnosis not present

## 2020-07-02 DIAGNOSIS — R634 Abnormal weight loss: Secondary | ICD-10-CM | POA: Diagnosis not present

## 2020-07-02 DIAGNOSIS — R410 Disorientation, unspecified: Secondary | ICD-10-CM | POA: Diagnosis not present

## 2020-07-02 DIAGNOSIS — F329 Major depressive disorder, single episode, unspecified: Secondary | ICD-10-CM | POA: Diagnosis not present

## 2020-07-02 DIAGNOSIS — G20 Parkinson's disease: Secondary | ICD-10-CM | POA: Diagnosis not present

## 2020-07-02 DIAGNOSIS — R1310 Dysphagia, unspecified: Secondary | ICD-10-CM | POA: Diagnosis not present

## 2020-07-03 DIAGNOSIS — F329 Major depressive disorder, single episode, unspecified: Secondary | ICD-10-CM | POA: Diagnosis not present

## 2020-07-03 DIAGNOSIS — R0902 Hypoxemia: Secondary | ICD-10-CM | POA: Diagnosis not present

## 2020-07-03 DIAGNOSIS — G20 Parkinson's disease: Secondary | ICD-10-CM | POA: Diagnosis not present

## 2020-07-03 DIAGNOSIS — R634 Abnormal weight loss: Secondary | ICD-10-CM | POA: Diagnosis not present

## 2020-07-03 DIAGNOSIS — R410 Disorientation, unspecified: Secondary | ICD-10-CM | POA: Diagnosis not present

## 2020-07-03 DIAGNOSIS — R1310 Dysphagia, unspecified: Secondary | ICD-10-CM | POA: Diagnosis not present

## 2020-07-03 DIAGNOSIS — Z87440 Personal history of urinary (tract) infections: Secondary | ICD-10-CM | POA: Diagnosis not present

## 2020-07-04 DIAGNOSIS — R634 Abnormal weight loss: Secondary | ICD-10-CM | POA: Diagnosis not present

## 2020-07-04 DIAGNOSIS — R0902 Hypoxemia: Secondary | ICD-10-CM | POA: Diagnosis not present

## 2020-07-04 DIAGNOSIS — R1310 Dysphagia, unspecified: Secondary | ICD-10-CM | POA: Diagnosis not present

## 2020-07-04 DIAGNOSIS — R410 Disorientation, unspecified: Secondary | ICD-10-CM | POA: Diagnosis not present

## 2020-07-04 DIAGNOSIS — G20 Parkinson's disease: Secondary | ICD-10-CM | POA: Diagnosis not present

## 2020-07-04 DIAGNOSIS — F329 Major depressive disorder, single episode, unspecified: Secondary | ICD-10-CM | POA: Diagnosis not present

## 2020-07-06 DIAGNOSIS — G20 Parkinson's disease: Secondary | ICD-10-CM | POA: Diagnosis not present

## 2020-07-06 DIAGNOSIS — R0902 Hypoxemia: Secondary | ICD-10-CM | POA: Diagnosis not present

## 2020-07-06 DIAGNOSIS — R634 Abnormal weight loss: Secondary | ICD-10-CM | POA: Diagnosis not present

## 2020-07-06 DIAGNOSIS — F329 Major depressive disorder, single episode, unspecified: Secondary | ICD-10-CM | POA: Diagnosis not present

## 2020-07-06 DIAGNOSIS — R410 Disorientation, unspecified: Secondary | ICD-10-CM | POA: Diagnosis not present

## 2020-07-06 DIAGNOSIS — R1310 Dysphagia, unspecified: Secondary | ICD-10-CM | POA: Diagnosis not present

## 2020-07-07 DIAGNOSIS — F329 Major depressive disorder, single episode, unspecified: Secondary | ICD-10-CM | POA: Diagnosis not present

## 2020-07-07 DIAGNOSIS — R1310 Dysphagia, unspecified: Secondary | ICD-10-CM | POA: Diagnosis not present

## 2020-07-07 DIAGNOSIS — R410 Disorientation, unspecified: Secondary | ICD-10-CM | POA: Diagnosis not present

## 2020-07-07 DIAGNOSIS — R634 Abnormal weight loss: Secondary | ICD-10-CM | POA: Diagnosis not present

## 2020-07-07 DIAGNOSIS — R0902 Hypoxemia: Secondary | ICD-10-CM | POA: Diagnosis not present

## 2020-07-07 DIAGNOSIS — G20 Parkinson's disease: Secondary | ICD-10-CM | POA: Diagnosis not present

## 2020-07-08 DIAGNOSIS — R1310 Dysphagia, unspecified: Secondary | ICD-10-CM | POA: Diagnosis not present

## 2020-07-08 DIAGNOSIS — R634 Abnormal weight loss: Secondary | ICD-10-CM | POA: Diagnosis not present

## 2020-07-08 DIAGNOSIS — R0902 Hypoxemia: Secondary | ICD-10-CM | POA: Diagnosis not present

## 2020-07-08 DIAGNOSIS — R410 Disorientation, unspecified: Secondary | ICD-10-CM | POA: Diagnosis not present

## 2020-07-08 DIAGNOSIS — G20 Parkinson's disease: Secondary | ICD-10-CM | POA: Diagnosis not present

## 2020-07-08 DIAGNOSIS — F329 Major depressive disorder, single episode, unspecified: Secondary | ICD-10-CM | POA: Diagnosis not present

## 2020-07-10 DIAGNOSIS — G20 Parkinson's disease: Secondary | ICD-10-CM | POA: Diagnosis not present

## 2020-07-10 DIAGNOSIS — R634 Abnormal weight loss: Secondary | ICD-10-CM | POA: Diagnosis not present

## 2020-07-10 DIAGNOSIS — F329 Major depressive disorder, single episode, unspecified: Secondary | ICD-10-CM | POA: Diagnosis not present

## 2020-07-10 DIAGNOSIS — R1310 Dysphagia, unspecified: Secondary | ICD-10-CM | POA: Diagnosis not present

## 2020-07-10 DIAGNOSIS — R410 Disorientation, unspecified: Secondary | ICD-10-CM | POA: Diagnosis not present

## 2020-07-10 DIAGNOSIS — R0902 Hypoxemia: Secondary | ICD-10-CM | POA: Diagnosis not present

## 2020-07-11 DIAGNOSIS — F329 Major depressive disorder, single episode, unspecified: Secondary | ICD-10-CM | POA: Diagnosis not present

## 2020-07-11 DIAGNOSIS — R0902 Hypoxemia: Secondary | ICD-10-CM | POA: Diagnosis not present

## 2020-07-11 DIAGNOSIS — R410 Disorientation, unspecified: Secondary | ICD-10-CM | POA: Diagnosis not present

## 2020-07-11 DIAGNOSIS — R1310 Dysphagia, unspecified: Secondary | ICD-10-CM | POA: Diagnosis not present

## 2020-07-11 DIAGNOSIS — G20 Parkinson's disease: Secondary | ICD-10-CM | POA: Diagnosis not present

## 2020-07-11 DIAGNOSIS — R634 Abnormal weight loss: Secondary | ICD-10-CM | POA: Diagnosis not present

## 2020-07-13 DIAGNOSIS — N390 Urinary tract infection, site not specified: Secondary | ICD-10-CM | POA: Diagnosis not present

## 2020-07-13 DIAGNOSIS — R0902 Hypoxemia: Secondary | ICD-10-CM | POA: Diagnosis not present

## 2020-07-13 DIAGNOSIS — R1310 Dysphagia, unspecified: Secondary | ICD-10-CM | POA: Diagnosis not present

## 2020-07-13 DIAGNOSIS — R410 Disorientation, unspecified: Secondary | ICD-10-CM | POA: Diagnosis not present

## 2020-07-13 DIAGNOSIS — K5909 Other constipation: Secondary | ICD-10-CM | POA: Diagnosis not present

## 2020-07-13 DIAGNOSIS — F329 Major depressive disorder, single episode, unspecified: Secondary | ICD-10-CM | POA: Diagnosis not present

## 2020-07-13 DIAGNOSIS — R634 Abnormal weight loss: Secondary | ICD-10-CM | POA: Diagnosis not present

## 2020-07-13 DIAGNOSIS — J9611 Chronic respiratory failure with hypoxia: Secondary | ICD-10-CM | POA: Diagnosis not present

## 2020-07-13 DIAGNOSIS — G20 Parkinson's disease: Secondary | ICD-10-CM | POA: Diagnosis not present

## 2020-07-13 DIAGNOSIS — R1312 Dysphagia, oropharyngeal phase: Secondary | ICD-10-CM | POA: Diagnosis not present

## 2020-07-15 ENCOUNTER — Telehealth (INDEPENDENT_AMBULATORY_CARE_PROVIDER_SITE_OTHER): Payer: Medicare Other | Admitting: Psychiatry

## 2020-07-15 ENCOUNTER — Other Ambulatory Visit: Payer: Self-pay

## 2020-07-15 DIAGNOSIS — F411 Generalized anxiety disorder: Secondary | ICD-10-CM

## 2020-07-15 DIAGNOSIS — F332 Major depressive disorder, recurrent severe without psychotic features: Secondary | ICD-10-CM

## 2020-07-15 DIAGNOSIS — R0902 Hypoxemia: Secondary | ICD-10-CM | POA: Diagnosis not present

## 2020-07-15 DIAGNOSIS — R1310 Dysphagia, unspecified: Secondary | ICD-10-CM | POA: Diagnosis not present

## 2020-07-15 DIAGNOSIS — G20 Parkinson's disease: Secondary | ICD-10-CM | POA: Diagnosis not present

## 2020-07-15 DIAGNOSIS — R410 Disorientation, unspecified: Secondary | ICD-10-CM | POA: Diagnosis not present

## 2020-07-15 DIAGNOSIS — R634 Abnormal weight loss: Secondary | ICD-10-CM | POA: Diagnosis not present

## 2020-07-15 DIAGNOSIS — F329 Major depressive disorder, single episode, unspecified: Secondary | ICD-10-CM | POA: Diagnosis not present

## 2020-07-15 MED ORDER — BUPROPION HCL ER (XL) 300 MG PO TB24
300.0000 mg | ORAL_TABLET | ORAL | 0 refills | Status: AC
Start: 2020-07-15 — End: ?

## 2020-07-15 MED ORDER — BUPROPION HCL ER (XL) 150 MG PO TB24: 150.0000 mg | ORAL_TABLET | Freq: Every day | ORAL | 0 refills | Status: AC

## 2020-07-15 MED ORDER — CITALOPRAM HYDROBROMIDE 40 MG PO TABS
40.0000 mg | ORAL_TABLET | Freq: Every day | ORAL | 0 refills | Status: AC
Start: 2020-07-15 — End: ?

## 2020-07-15 MED ORDER — BREXPIPRAZOLE 1 MG PO TABS: 1.0000 mg | ORAL_TABLET | Freq: Every day | ORAL | 0 refills | Status: AC

## 2020-07-15 NOTE — [place] Notes (Signed)
Virtual Visit via Telephone Note  I connected with [name] [name] on 07/15/20 at 11:00 AM EDT by telephone and verified that I am speaking with the correct person using two identifiers.  Location: Patient: home Provider: home office   I discussed the limitations, risks, security and privacy concerns of performing an evaluation and management service by telephone and the availability of in person appointments. I also discussed with the patient that there may be a patient responsible charge related to this service. The patient expressed understanding and agreed to proceed.   History of Present Illness: Patient is evaluated by phone session.  His wife [name] [name] was also present and provided most of the information.  Patient recently moved to in-house hospice care and his wife is very concerned about his gradual decline in his daily functioning.  He is now 24/7 on the bed.  He has difficulty eating, swallowing and he is now on 3 L of oxygen.  There are periods of disorientation but today he is alert and oriented and able to respond smell answers.  He is still have hallucinations and sometimes paranoia but as per wife it is stable.  She does not want to change the medication since Rexulti help better than olanzapine but it is expensive.  She is able to get better price through [facility].  She is getting another medicine through hospice and that is helpful.  Patient denies any suicidal thoughts but admitted that sometimes he feels depressed because of his health condition.  Since he is on 24/7 on bed he has not had a fall.  His birthday is coming in 3 days and he remember his birthday.  He is taking Celexa and Wellbutrin.  As per wife he is not violent or aggressive.  Past Psychiatric History: H/Odepression and anxiety.Noh/osuicidal attempt,mania,inpatient psychiatric treatment.H/Ohallucination, delusion due to Parkinson.Tried Effexor, Abilify, Paxil, Cymbalta, Zoloft, amitriptyline, Pristiq,  Britnellex, Celexa, Deplin,Vyvanseand Latuda.Seeingin this office since September 2009. [place] psychiatrists in the past.  Psychiatric Specialty Exam: Physical Exam  Review of Systems  There were no vitals taken for this visit.There is no height or weight on file to calculate BMI.  General Appearance: NA  Eye Contact:  NA  Speech:  Slow  Volume:  Decreased  Mood:  Dysphoric  Affect:  NA  Thought Process:  Descriptions of Associations: Intact  Orientation:  Full (Time, Place, and Person)  Thought Content:  Hallucinations: Visual and poverty of thought content  Suicidal Thoughts:  No  Homicidal Thoughts:  No  Memory:  Immediate;   Fair Recent;   Fair Remote;   Fair  Judgement:  Fair  Insight:  Shallow  Psychomotor Activity:  NA  Concentration:  Concentration: Fair and Attention Span: Fair  Recall:  [employer] of Knowledge:  Fair  Language:  Fair  Akathisia:  No  Handed:  Right  AIMS (if indicated):     Assets:  [employer] Housing Social Support  ADL's:  Impaired  Cognition:  Impaired,  Mild  Sleep:   fair      Assessment and Plan: 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  Major depressive disorder, recurrent.  Generalized anxiety disorder.  Patient is now in home hospice care due to declining in his daily functioning.  As per wife patient is prescribed morphine but she does not give morphine as she has noticed episodes of constipation and diarrhea and she is reluctant to provide any medicine that make him more constipated.  She absolutely does not want  to change the medication and like to keep the Rexulti 1 mg, Celexa 40 mg and Wellbutrin 450 mg daily.  She is afraid about his health condition however reassurance given.  Discussed medication side effects and benefits and patient's long-term prognosis.  Patient also does not want to change the medication.  We will continue Rexulti 1 mg daily, Celexa 40 mg daily, Wellbutrin XL 300 mg in the  morning and 150 at bedtime.  We will send Rexulti 1 mg to [facility] all other medication is given by hospice in house physician Dr. Marden Noble. Recommended to call us back if he has any question or any concern.  Follow-up in 3 months.     Follow Up Instructions:    I discussed the assessment and treatment plan with the patient. The patient was provided an opportunity to ask questions and all were answered. The patient agreed with the plan and demonstrated an understanding of the instructions.   The patient was advised to call back or seek an in-person evaluation if the symptoms worsen or if the condition fails to improve as anticipated.  I provided 25 minutes of non-face-to-face time during this encounter.   Kathlee Nations, MD

## 2020-07-16 DIAGNOSIS — G20 Parkinson's disease: Secondary | ICD-10-CM | POA: Diagnosis not present

## 2020-07-16 DIAGNOSIS — R0902 Hypoxemia: Secondary | ICD-10-CM | POA: Diagnosis not present

## 2020-07-16 DIAGNOSIS — R634 Abnormal weight loss: Secondary | ICD-10-CM | POA: Diagnosis not present

## 2020-07-16 DIAGNOSIS — R1310 Dysphagia, unspecified: Secondary | ICD-10-CM | POA: Diagnosis not present

## 2020-07-16 DIAGNOSIS — F329 Major depressive disorder, single episode, unspecified: Secondary | ICD-10-CM | POA: Diagnosis not present

## 2020-07-16 DIAGNOSIS — R410 Disorientation, unspecified: Secondary | ICD-10-CM | POA: Diagnosis not present

## 2020-07-18 DIAGNOSIS — R0902 Hypoxemia: Secondary | ICD-10-CM | POA: Diagnosis not present

## 2020-07-18 DIAGNOSIS — R1310 Dysphagia, unspecified: Secondary | ICD-10-CM | POA: Diagnosis not present

## 2020-07-18 DIAGNOSIS — G20 Parkinson's disease: Secondary | ICD-10-CM | POA: Diagnosis not present

## 2020-07-18 DIAGNOSIS — R410 Disorientation, unspecified: Secondary | ICD-10-CM | POA: Diagnosis not present

## 2020-07-18 DIAGNOSIS — R634 Abnormal weight loss: Secondary | ICD-10-CM | POA: Diagnosis not present

## 2020-07-18 DIAGNOSIS — F329 Major depressive disorder, single episode, unspecified: Secondary | ICD-10-CM | POA: Diagnosis not present

## 2020-07-20 DIAGNOSIS — R0902 Hypoxemia: Secondary | ICD-10-CM | POA: Diagnosis not present

## 2020-07-20 DIAGNOSIS — R634 Abnormal weight loss: Secondary | ICD-10-CM | POA: Diagnosis not present

## 2020-07-20 DIAGNOSIS — F329 Major depressive disorder, single episode, unspecified: Secondary | ICD-10-CM | POA: Diagnosis not present

## 2020-07-20 DIAGNOSIS — G20 Parkinson's disease: Secondary | ICD-10-CM | POA: Diagnosis not present

## 2020-07-20 DIAGNOSIS — R410 Disorientation, unspecified: Secondary | ICD-10-CM | POA: Diagnosis not present

## 2020-07-20 DIAGNOSIS — R1310 Dysphagia, unspecified: Secondary | ICD-10-CM | POA: Diagnosis not present

## 2020-07-22 DIAGNOSIS — R1310 Dysphagia, unspecified: Secondary | ICD-10-CM | POA: Diagnosis not present

## 2020-07-22 DIAGNOSIS — F329 Major depressive disorder, single episode, unspecified: Secondary | ICD-10-CM | POA: Diagnosis not present

## 2020-07-22 DIAGNOSIS — R410 Disorientation, unspecified: Secondary | ICD-10-CM | POA: Diagnosis not present

## 2020-07-22 DIAGNOSIS — G20 Parkinson's disease: Secondary | ICD-10-CM | POA: Diagnosis not present

## 2020-07-22 DIAGNOSIS — R0902 Hypoxemia: Secondary | ICD-10-CM | POA: Diagnosis not present

## 2020-07-22 DIAGNOSIS — R634 Abnormal weight loss: Secondary | ICD-10-CM | POA: Diagnosis not present

## 2020-07-23 DIAGNOSIS — R634 Abnormal weight loss: Secondary | ICD-10-CM | POA: Diagnosis not present

## 2020-07-23 DIAGNOSIS — F329 Major depressive disorder, single episode, unspecified: Secondary | ICD-10-CM | POA: Diagnosis not present

## 2020-07-23 DIAGNOSIS — G20 Parkinson's disease: Secondary | ICD-10-CM | POA: Diagnosis not present

## 2020-07-23 DIAGNOSIS — R1310 Dysphagia, unspecified: Secondary | ICD-10-CM | POA: Diagnosis not present

## 2020-07-23 DIAGNOSIS — R410 Disorientation, unspecified: Secondary | ICD-10-CM | POA: Diagnosis not present

## 2020-07-23 DIAGNOSIS — R0902 Hypoxemia: Secondary | ICD-10-CM | POA: Diagnosis not present

## 2020-07-24 DIAGNOSIS — F329 Major depressive disorder, single episode, unspecified: Secondary | ICD-10-CM | POA: Diagnosis not present

## 2020-07-24 DIAGNOSIS — R410 Disorientation, unspecified: Secondary | ICD-10-CM | POA: Diagnosis not present

## 2020-07-24 DIAGNOSIS — R0902 Hypoxemia: Secondary | ICD-10-CM | POA: Diagnosis not present

## 2020-07-24 DIAGNOSIS — R634 Abnormal weight loss: Secondary | ICD-10-CM | POA: Diagnosis not present

## 2020-07-24 DIAGNOSIS — G20 Parkinson's disease: Secondary | ICD-10-CM | POA: Diagnosis not present

## 2020-07-24 DIAGNOSIS — R1310 Dysphagia, unspecified: Secondary | ICD-10-CM | POA: Diagnosis not present

## 2020-07-25 DIAGNOSIS — R0902 Hypoxemia: Secondary | ICD-10-CM | POA: Diagnosis not present

## 2020-07-25 DIAGNOSIS — R1310 Dysphagia, unspecified: Secondary | ICD-10-CM | POA: Diagnosis not present

## 2020-07-25 DIAGNOSIS — R410 Disorientation, unspecified: Secondary | ICD-10-CM | POA: Diagnosis not present

## 2020-07-25 DIAGNOSIS — G20 Parkinson's disease: Secondary | ICD-10-CM | POA: Diagnosis not present

## 2020-07-25 DIAGNOSIS — F329 Major depressive disorder, single episode, unspecified: Secondary | ICD-10-CM | POA: Diagnosis not present

## 2020-07-25 DIAGNOSIS — R634 Abnormal weight loss: Secondary | ICD-10-CM | POA: Diagnosis not present

## 2020-07-26 DIAGNOSIS — R410 Disorientation, unspecified: Secondary | ICD-10-CM | POA: Diagnosis not present

## 2020-07-26 DIAGNOSIS — F329 Major depressive disorder, single episode, unspecified: Secondary | ICD-10-CM | POA: Diagnosis not present

## 2020-07-26 DIAGNOSIS — R0902 Hypoxemia: Secondary | ICD-10-CM | POA: Diagnosis not present

## 2020-07-26 DIAGNOSIS — R634 Abnormal weight loss: Secondary | ICD-10-CM | POA: Diagnosis not present

## 2020-07-26 DIAGNOSIS — R1310 Dysphagia, unspecified: Secondary | ICD-10-CM | POA: Diagnosis not present

## 2020-07-26 DIAGNOSIS — G20 Parkinson's disease: Secondary | ICD-10-CM | POA: Diagnosis not present

## 2020-07-27 DIAGNOSIS — R1310 Dysphagia, unspecified: Secondary | ICD-10-CM | POA: Diagnosis not present

## 2020-07-27 DIAGNOSIS — G20 Parkinson's disease: Secondary | ICD-10-CM | POA: Diagnosis not present

## 2020-07-27 DIAGNOSIS — R634 Abnormal weight loss: Secondary | ICD-10-CM | POA: Diagnosis not present

## 2020-07-27 DIAGNOSIS — F329 Major depressive disorder, single episode, unspecified: Secondary | ICD-10-CM | POA: Diagnosis not present

## 2020-07-27 DIAGNOSIS — R410 Disorientation, unspecified: Secondary | ICD-10-CM | POA: Diagnosis not present

## 2020-07-27 DIAGNOSIS — R0902 Hypoxemia: Secondary | ICD-10-CM | POA: Diagnosis not present

## 2020-07-28 DIAGNOSIS — R0902 Hypoxemia: Secondary | ICD-10-CM | POA: Diagnosis not present

## 2020-07-28 DIAGNOSIS — F329 Major depressive disorder, single episode, unspecified: Secondary | ICD-10-CM | POA: Diagnosis not present

## 2020-07-28 DIAGNOSIS — R1310 Dysphagia, unspecified: Secondary | ICD-10-CM | POA: Diagnosis not present

## 2020-07-28 DIAGNOSIS — R634 Abnormal weight loss: Secondary | ICD-10-CM | POA: Diagnosis not present

## 2020-07-28 DIAGNOSIS — G20 Parkinson's disease: Secondary | ICD-10-CM | POA: Diagnosis not present

## 2020-07-28 DIAGNOSIS — R410 Disorientation, unspecified: Secondary | ICD-10-CM | POA: Diagnosis not present

## 2020-07-29 DIAGNOSIS — R634 Abnormal weight loss: Secondary | ICD-10-CM | POA: Diagnosis not present

## 2020-07-29 DIAGNOSIS — R410 Disorientation, unspecified: Secondary | ICD-10-CM | POA: Diagnosis not present

## 2020-07-29 DIAGNOSIS — F329 Major depressive disorder, single episode, unspecified: Secondary | ICD-10-CM | POA: Diagnosis not present

## 2020-07-29 DIAGNOSIS — G20 Parkinson's disease: Secondary | ICD-10-CM | POA: Diagnosis not present

## 2020-07-29 DIAGNOSIS — R0902 Hypoxemia: Secondary | ICD-10-CM | POA: Diagnosis not present

## 2020-07-29 DIAGNOSIS — R1310 Dysphagia, unspecified: Secondary | ICD-10-CM | POA: Diagnosis not present

## 2020-07-30 ENCOUNTER — Telehealth (HOSPITAL_COMMUNITY): Payer: Self-pay | Admitting: *Deleted

## 2020-07-30 DIAGNOSIS — R634 Abnormal weight loss: Secondary | ICD-10-CM | POA: Diagnosis not present

## 2020-07-30 DIAGNOSIS — R410 Disorientation, unspecified: Secondary | ICD-10-CM | POA: Diagnosis not present

## 2020-07-30 DIAGNOSIS — R1310 Dysphagia, unspecified: Secondary | ICD-10-CM | POA: Diagnosis not present

## 2020-07-30 DIAGNOSIS — F329 Major depressive disorder, single episode, unspecified: Secondary | ICD-10-CM | POA: Diagnosis not present

## 2020-07-30 DIAGNOSIS — R0902 Hypoxemia: Secondary | ICD-10-CM | POA: Diagnosis not present

## 2020-07-30 DIAGNOSIS — G20 Parkinson's disease: Secondary | ICD-10-CM | POA: Diagnosis not present

## 2020-07-30 NOTE — Telephone Encounter (Signed)
Pt's wife called to cx pt appointment on October 23, 2020 as he has passed away today. She is available to speak to you if needed.

## 2020-08-03 ENCOUNTER — Other Ambulatory Visit (HOSPITAL_COMMUNITY): Payer: Self-pay | Admitting: Psychiatry

## 2020-08-03 DIAGNOSIS — F332 Major depressive disorder, recurrent severe without psychotic features: Secondary | ICD-10-CM

## 2020-08-03 DIAGNOSIS — G20 Parkinson's disease: Secondary | ICD-10-CM

## 2020-08-03 DEATH — deceased

## 2020-08-04 NOTE — Telephone Encounter (Signed)
I called his wife who told patient died last 01/20/23 while he was in hospice care and [employer].  I provided condolence of her loss. I offer hospice counseling but patient told that she had a good support system but she will think about grief counseling if needed.  She appreciated my phone call and promised to reach out if she need help in the future.

## 2020-09-22 IMAGING — DX DG CHEST 1V PORT
1 series · 1 of 1 positions shown · non-contrast
Comparison: 10/02/2013

CLINICAL DATA: Preop hip fracture.

EXAM:
PORTABLE CHEST 1 VIEW

[chest ap]
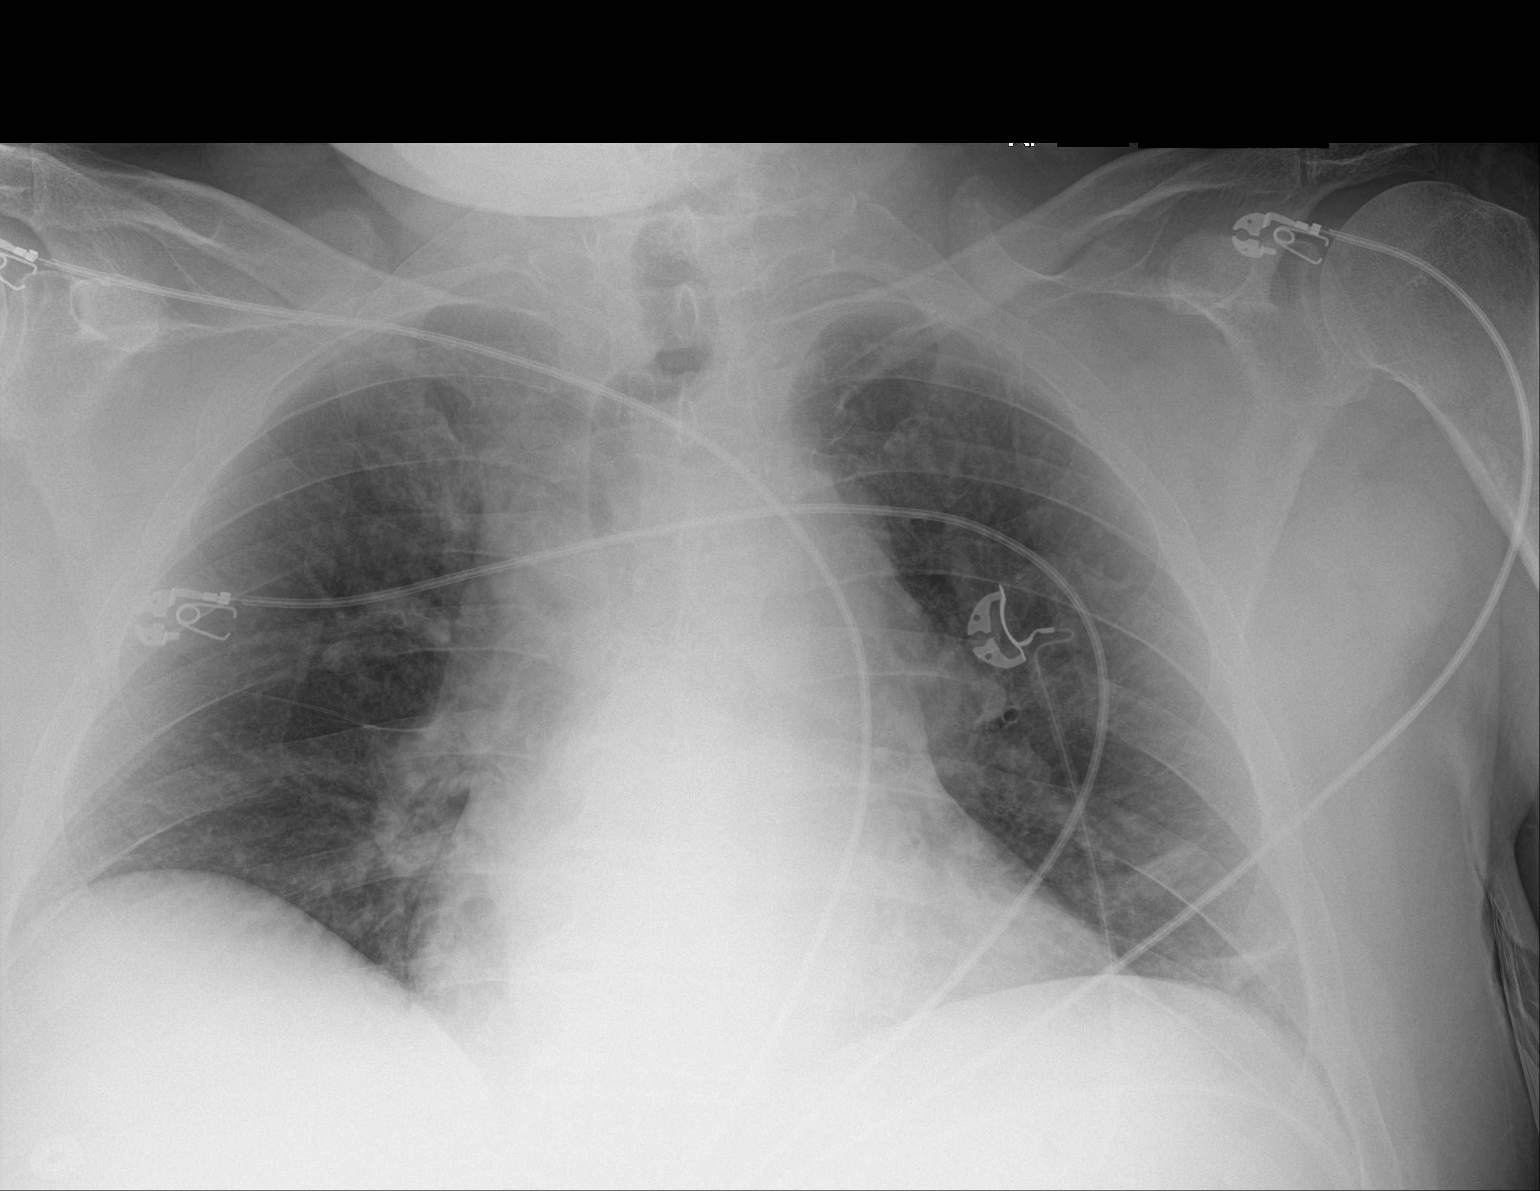

[1 of 1 positions shown; findings below may reference images not displayed]

FINDINGS: Mild cardiomegaly and vascular congestion. Low lung volumes. Left
base atelectasis. No confluent opacity on the right. No overt edema,
effusions or acute bony abnormality.
IMPRESSION: Low lung volumes with mild cardiomegaly, vascular congestion and
left base atelectasis.

## 2020-09-23 IMAGING — DX DG PORTABLE PELVIS
1 series · 1 of 1 positions shown · non-contrast
Comparison: None.

CLINICAL DATA: Status post left hip replacement

EXAM:
PORTABLE PELVIS 1 VIEWS

[pelvis]
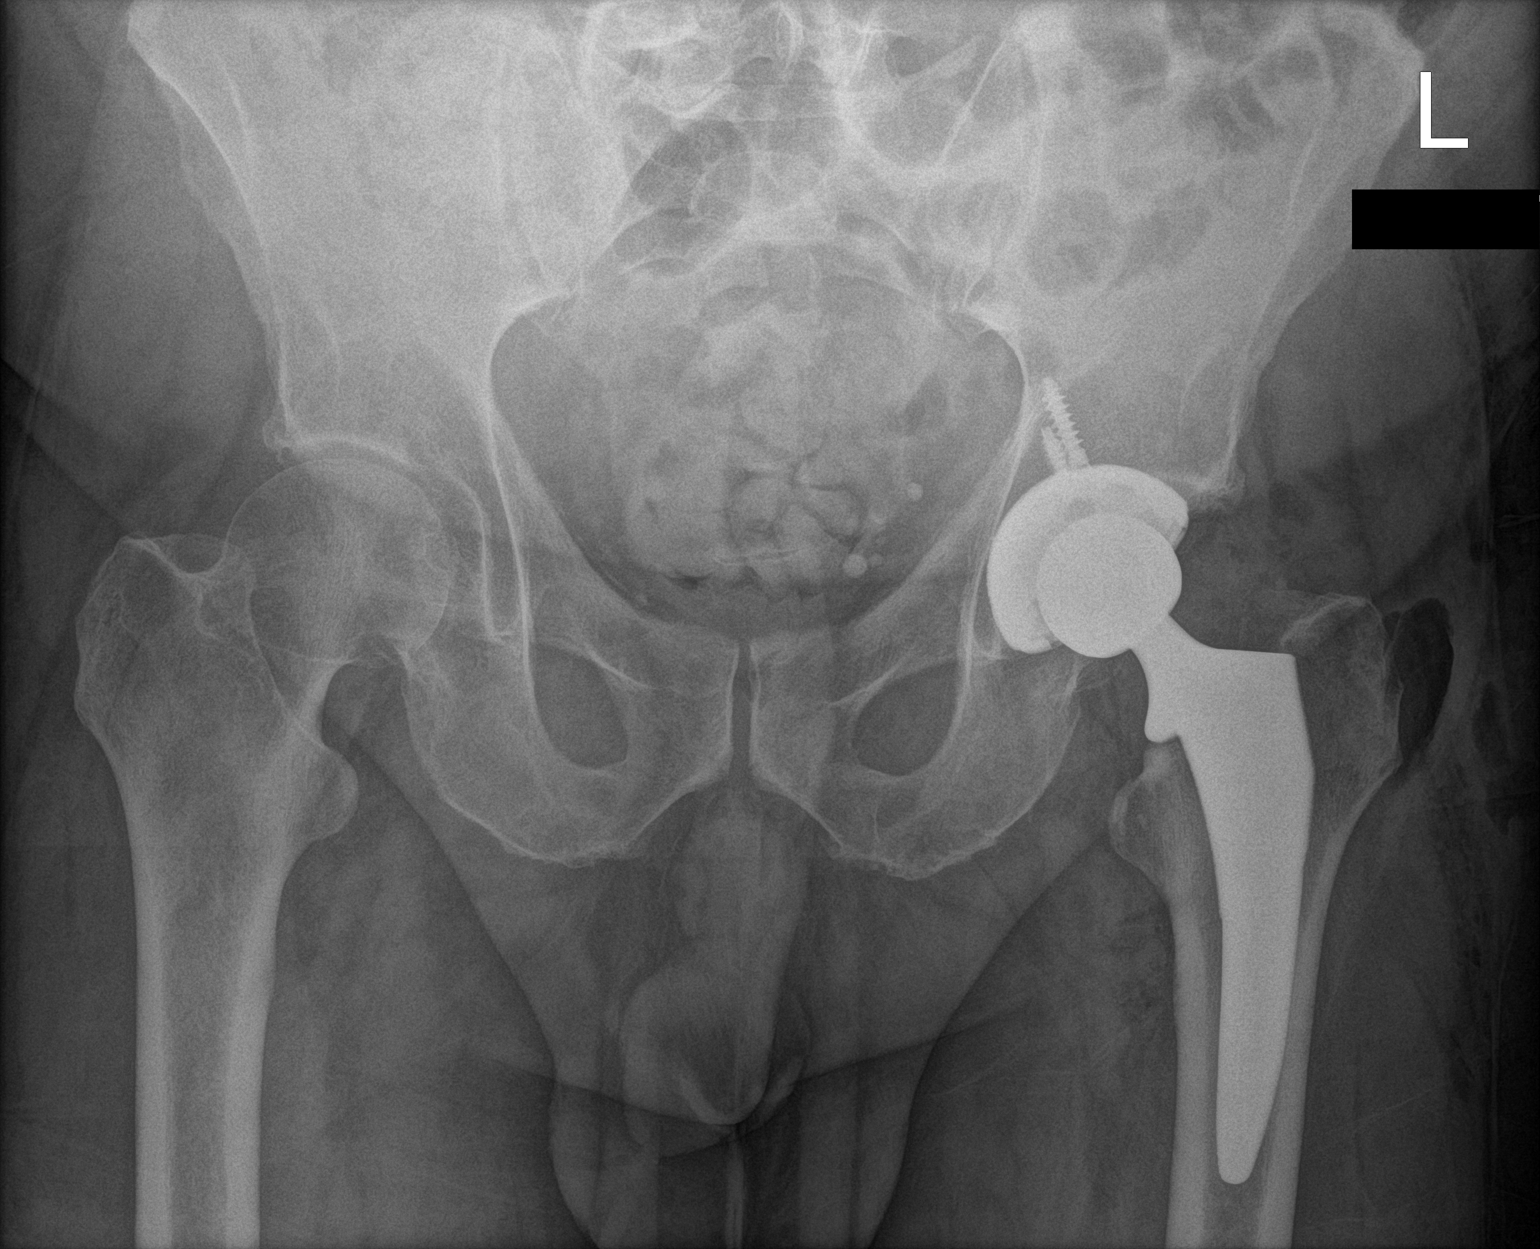

[1 of 1 positions shown; findings below may reference images not displayed]

FINDINGS: Left hip prosthesis is noted in satisfactory position. No acute soft
tissue or bony abnormality is noted.
IMPRESSION: Status post left hip replacement

## 2020-09-23 IMAGING — RF DG C-ARM 61-120 MIN
1 series · 7 of 7 positions shown · non-contrast
Comparison: None.

CLINICAL DATA: Left femoral neck fracture

EXAM:
OPERATIVE LEFT HIP WITH PELVIS; DG C-ARM 61-120 MIN

[Series 1: run · 7 of 7 slices shown]
[im 1/7]
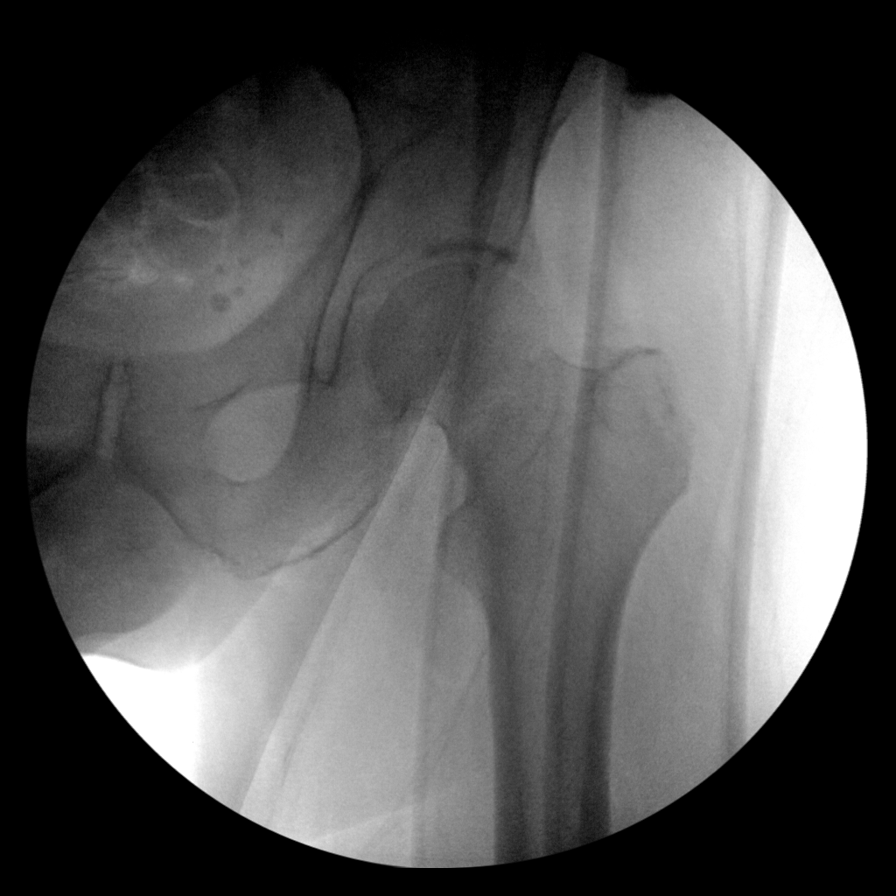
[im 2/7]
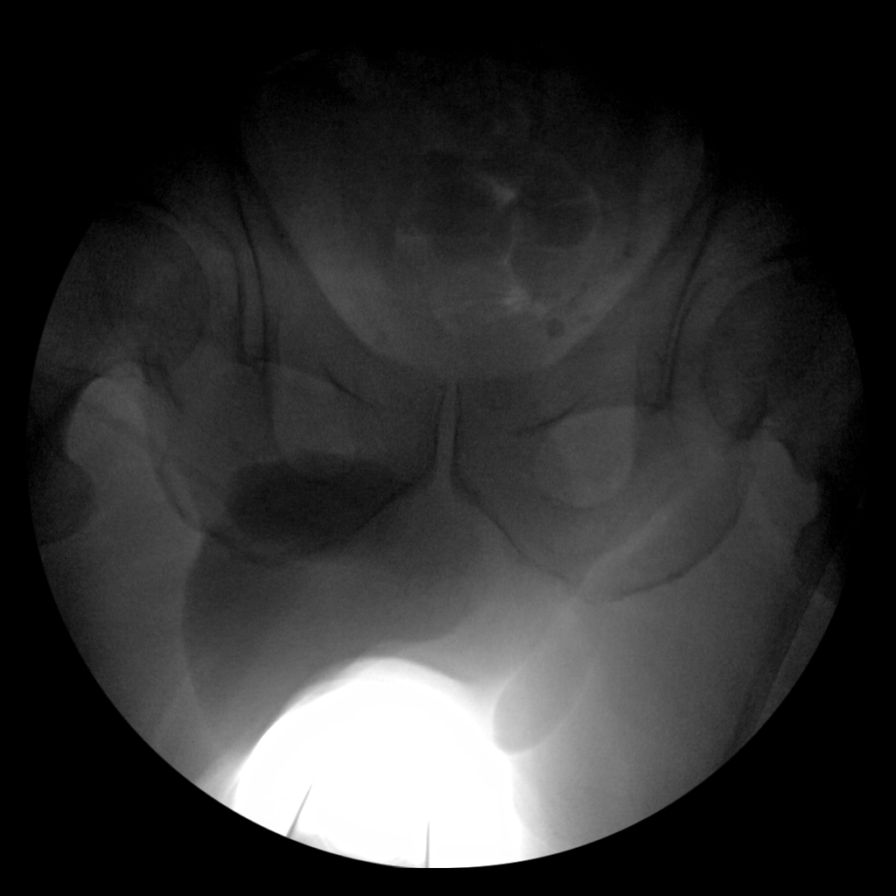
[im 3/7]
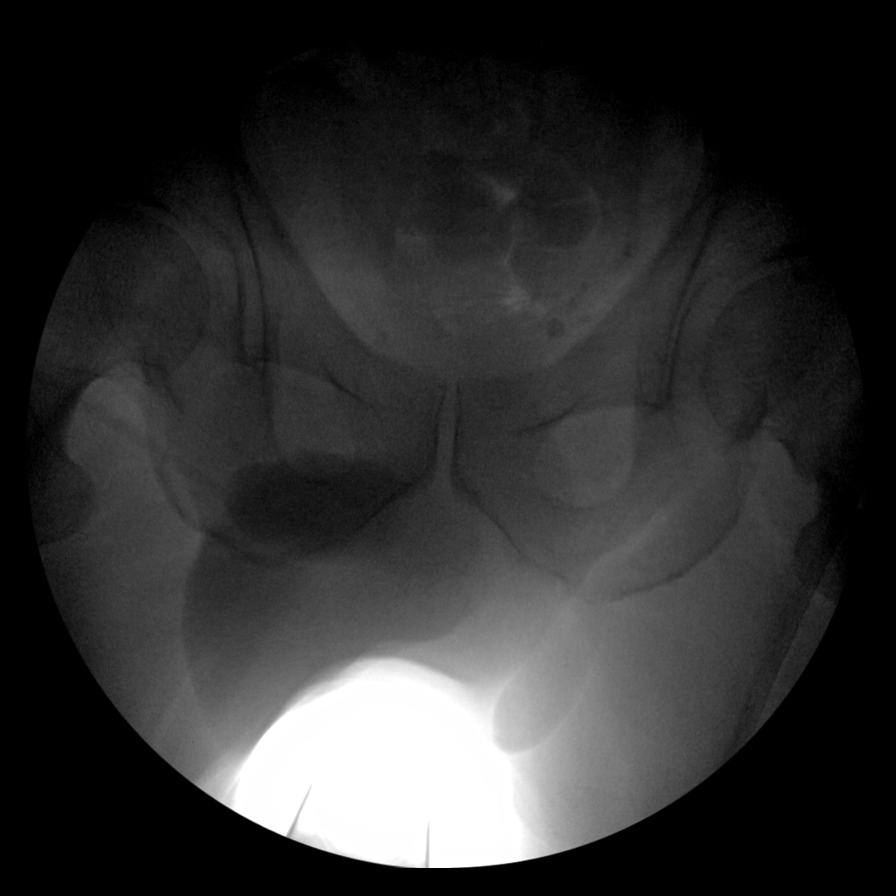
[im 4/7]
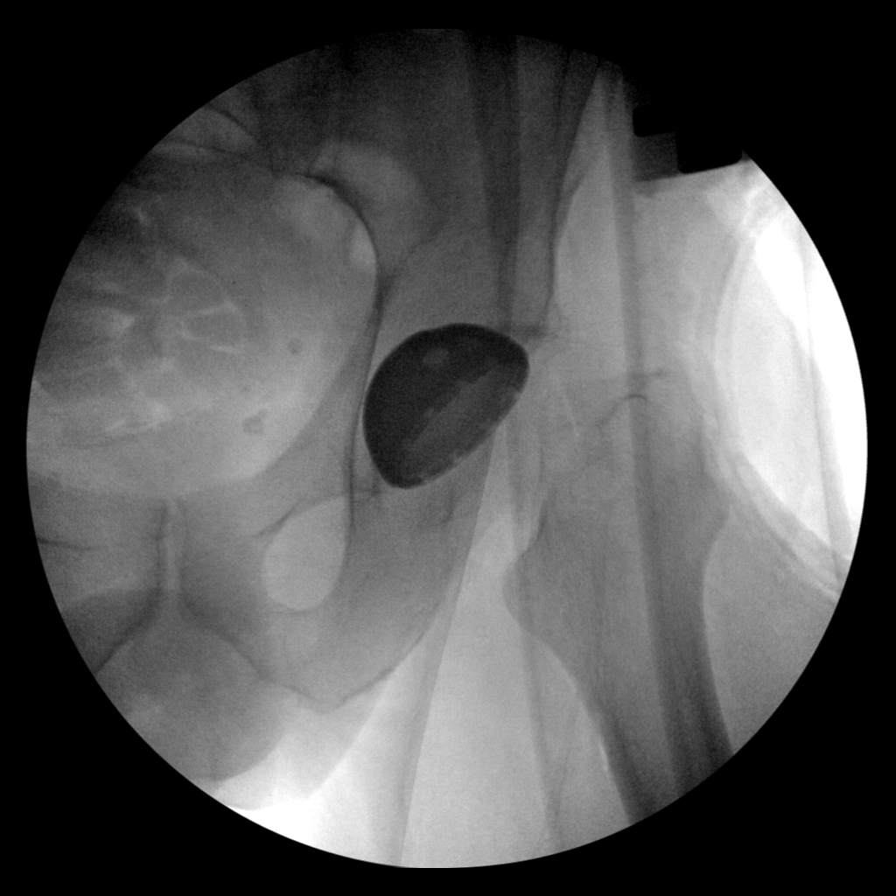
[im 5/7]
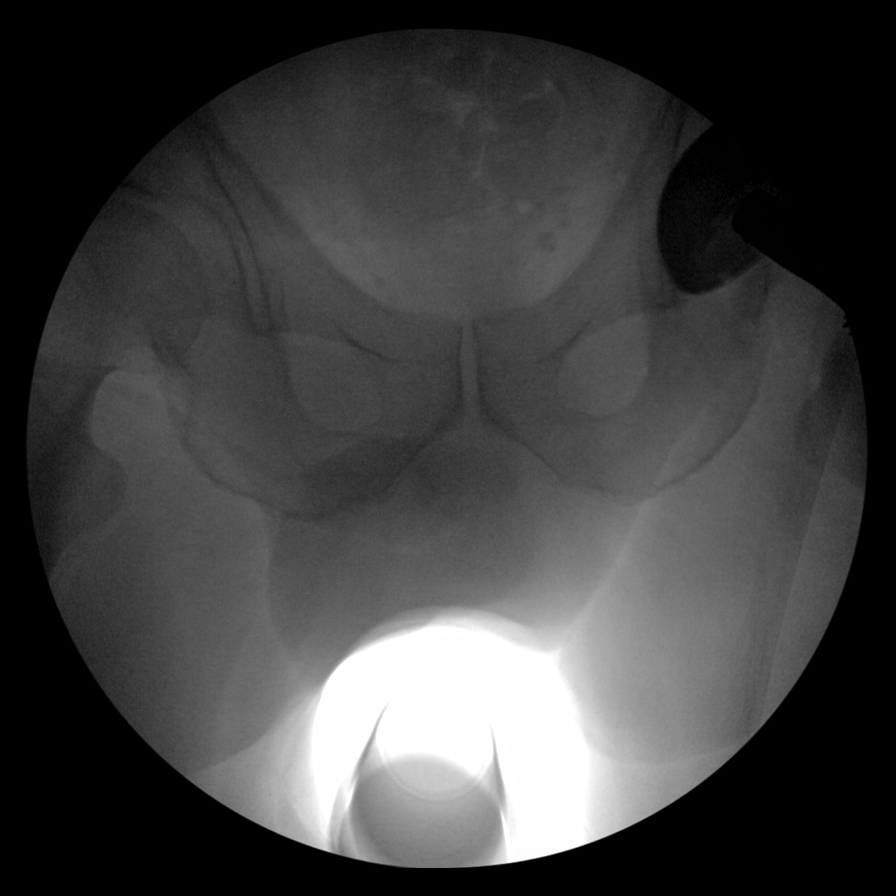
[im 6/7]
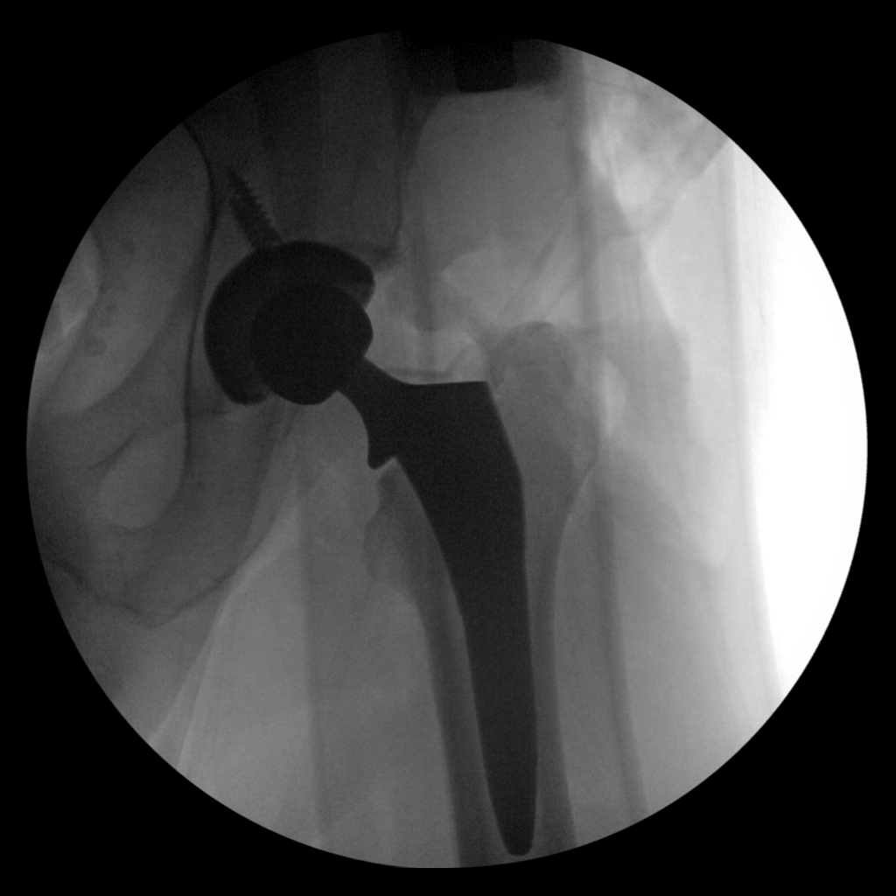
[im 7/7]
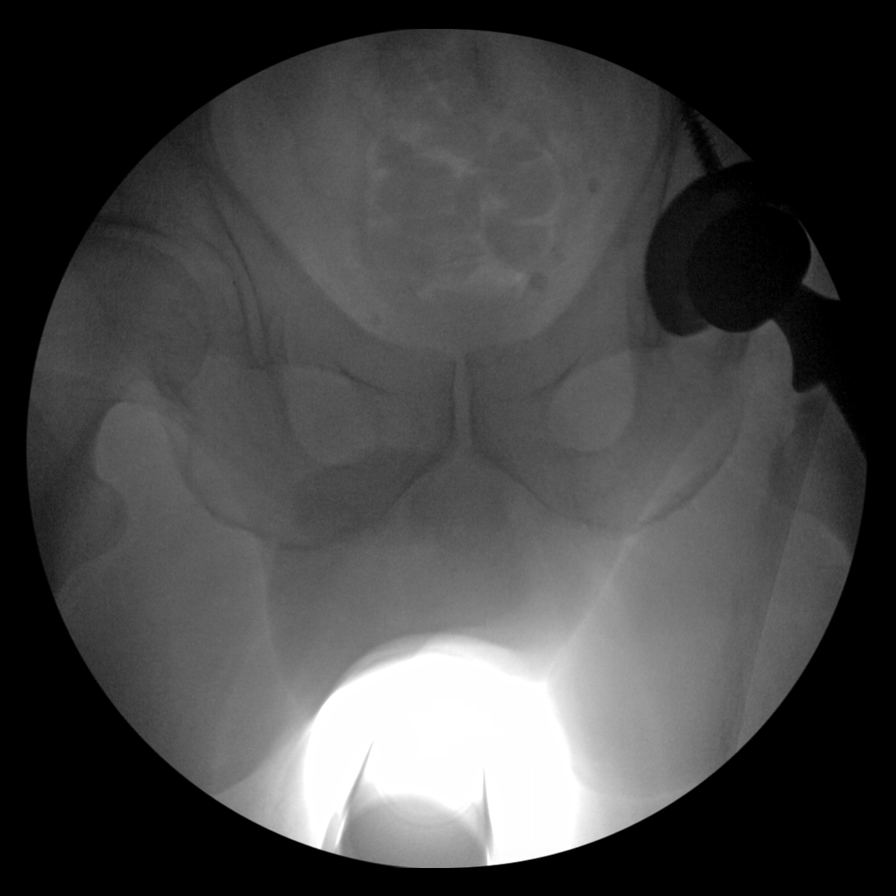

[7 of 7 positions shown; findings below may reference images not displayed]

FLUOROSCOPY TIME:  Fluoroscopy Time:  38 seconds

Radiation Exposure Index (if provided by the fluoroscopic device):
Not available

Number of Acquired Spot Images: 6
FINDINGS: Initial images again demonstrate the femoral neck fracture.
Subsequent removal of the femoral head with placement of the
acetabular component is seen. Left femoral portion of the prosthesis
is then placed in satisfactory position.
IMPRESSION: Status post left hip replacement.

## 2020-10-15 ENCOUNTER — Telehealth (HOSPITAL_COMMUNITY): Payer: Medicare Other | Admitting: Psychiatry

## 2020-11-09 ENCOUNTER — Telehealth: Payer: Medicare Other | Admitting: Neurology

## 2020-12-13 IMAGING — CT CT HEAD WITHOUT CONTRAST
1 series · 16 of 30 positions shown, 20 images · non-contrast
Comparison: None.

CLINICAL DATA: Hallucinations, AMS

EXAM:
CT HEAD WITHOUT CONTRAST
TECHNIQUE: Contiguous axial images were obtained from the base of the skull
through the vertex without intravenous contrast.

[Series 2: head w/(date) · axial · 0.45mm/px · z∈[-182,-32]mm · 16 of 34 slices shown, 20 images]
[im 2/34  brain]
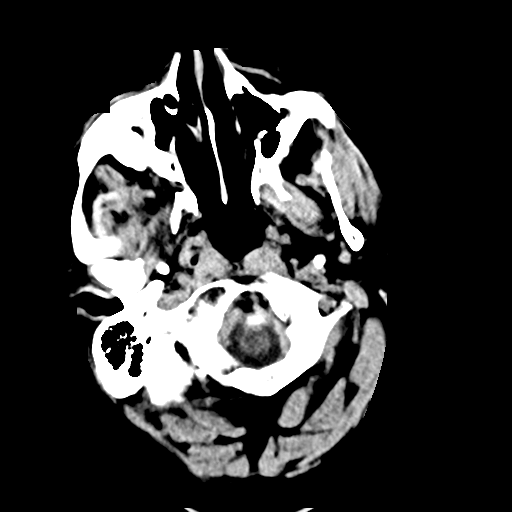
[im 2/34  bone]
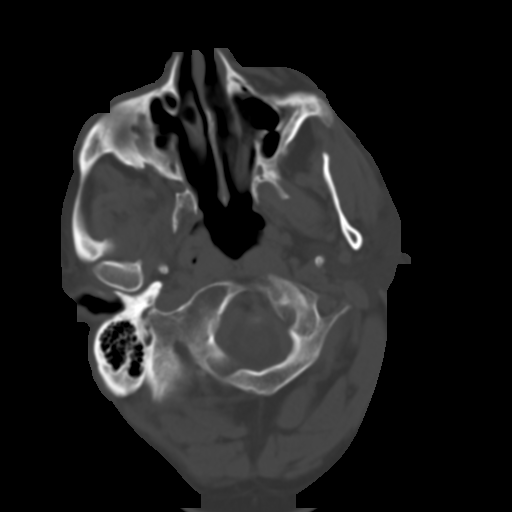
[im 4/34  brain]
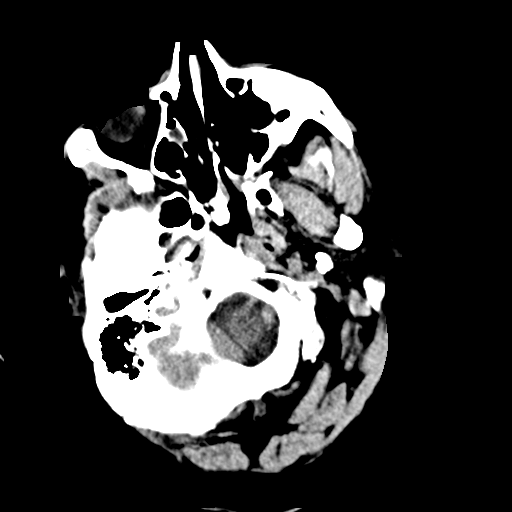
[im 6/34  brain]
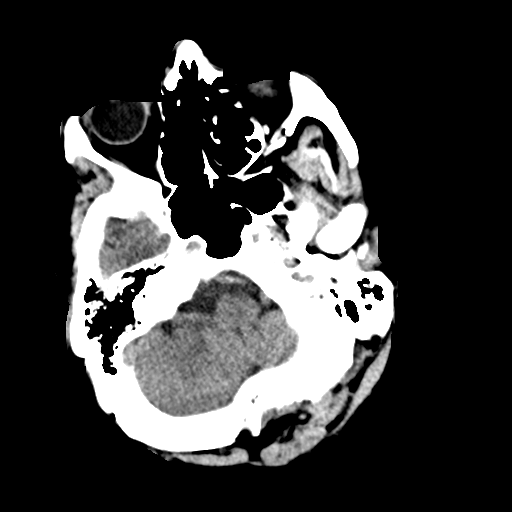
[im 8/34  brain]
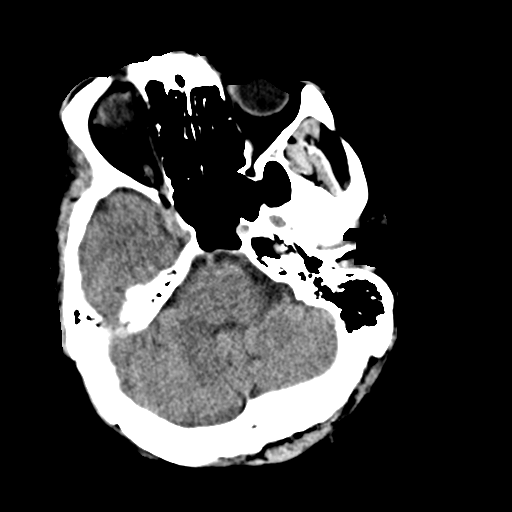
[im 10/34  brain]
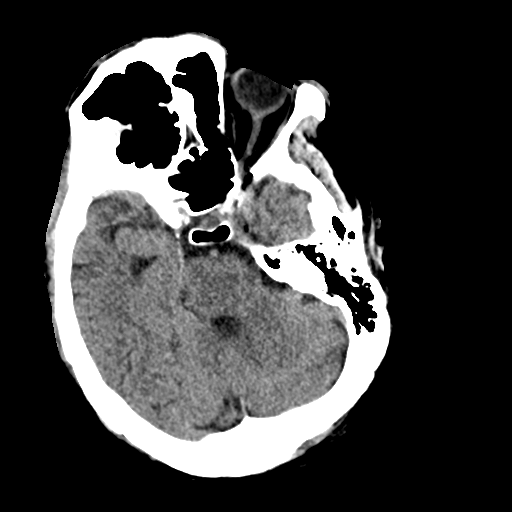
[im 10/34  bone]
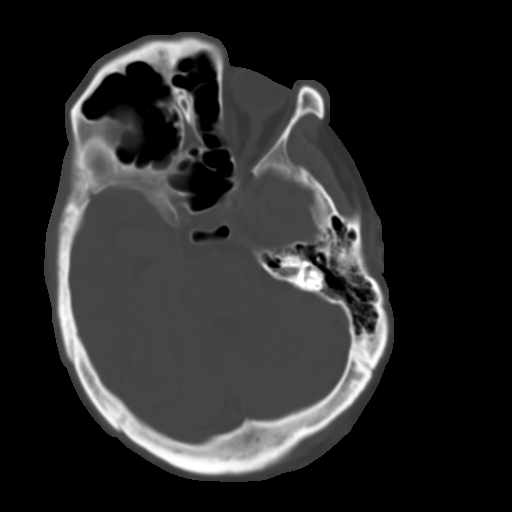
[im 12/34  brain]
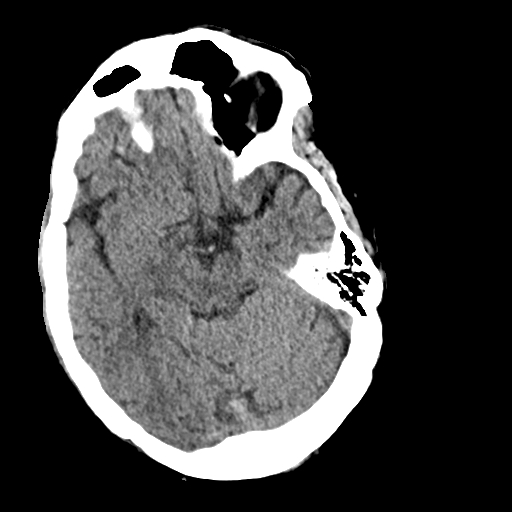
[im 14/34  brain]
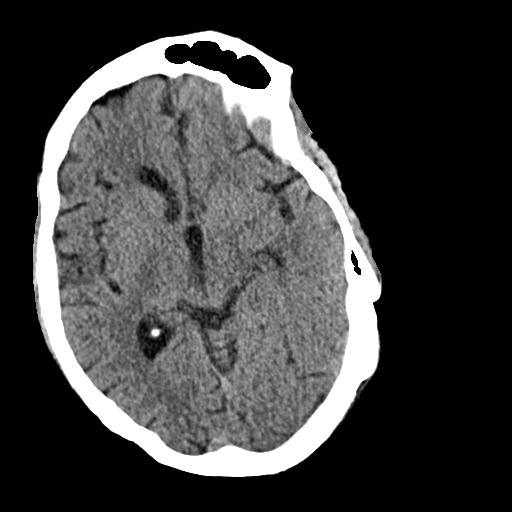
[im 16/34  brain]
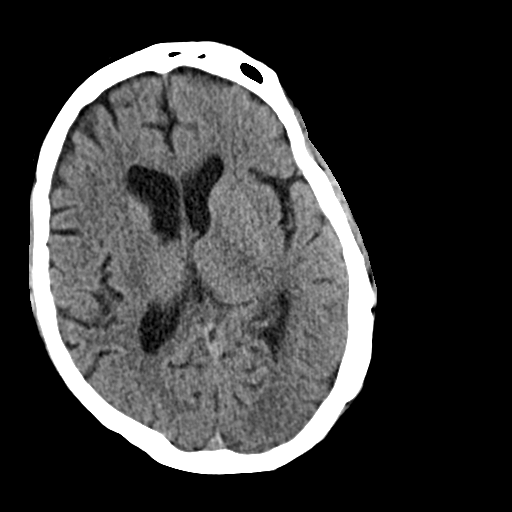
[im 18/34  brain]
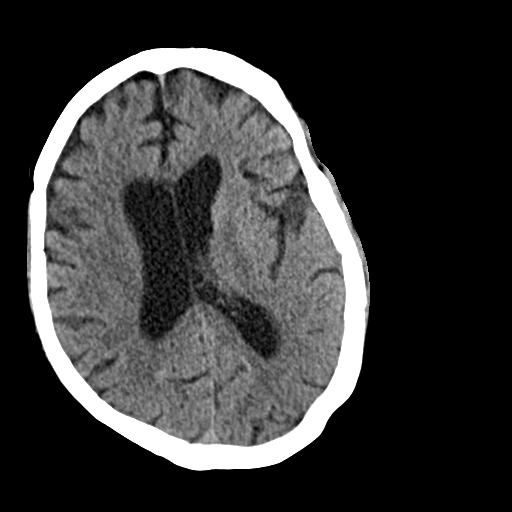
[im 18/34  bone]
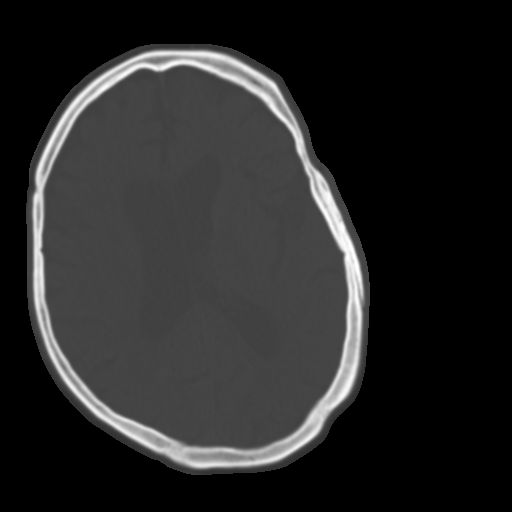
[im 20/34  brain]
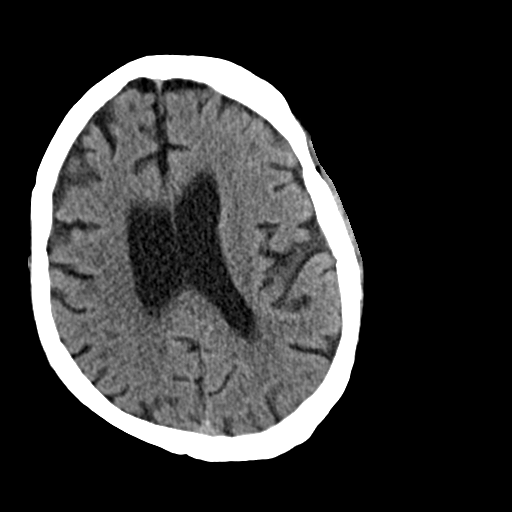
[im 22/34  brain]
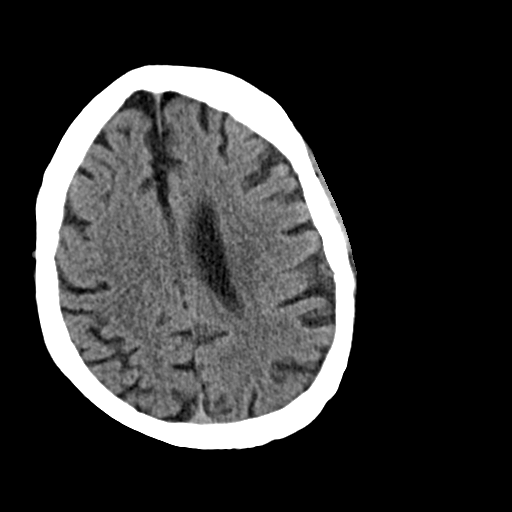
[im 24/34  brain]
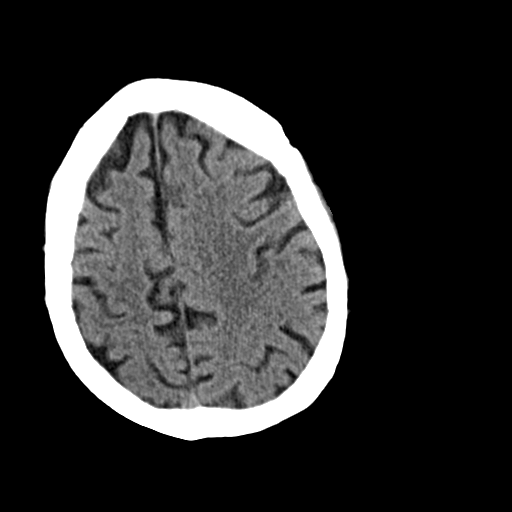
[im 26/34  brain]
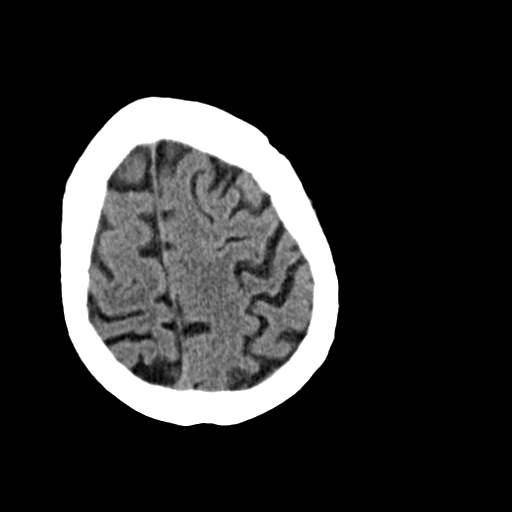
[im 26/34  bone]
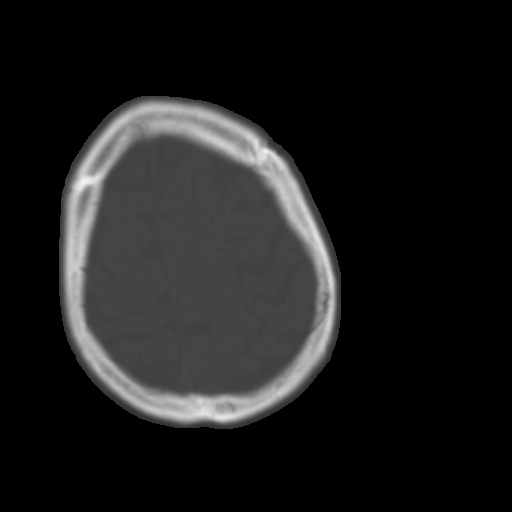
[im 28/34  brain]
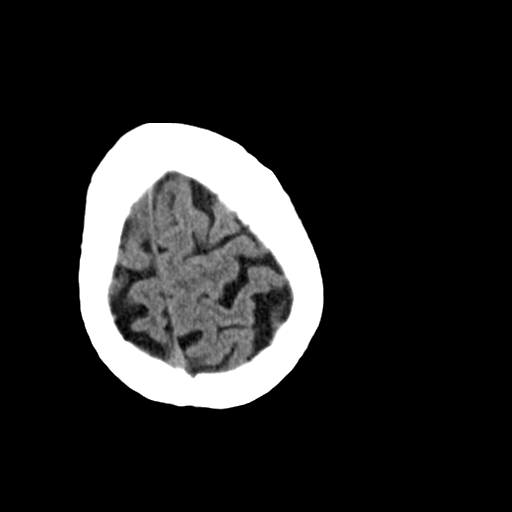
[im 30/34  brain]
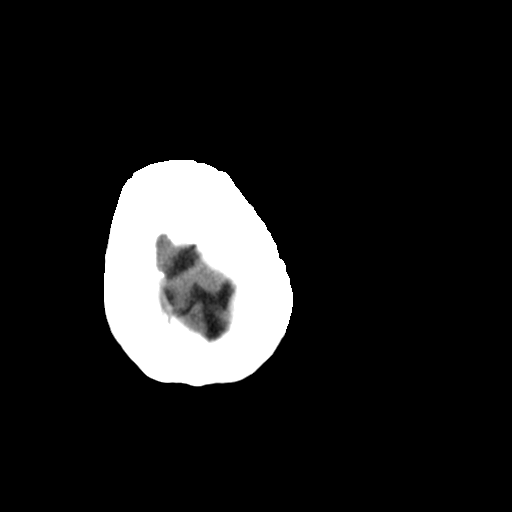
[im 32/34  brain]
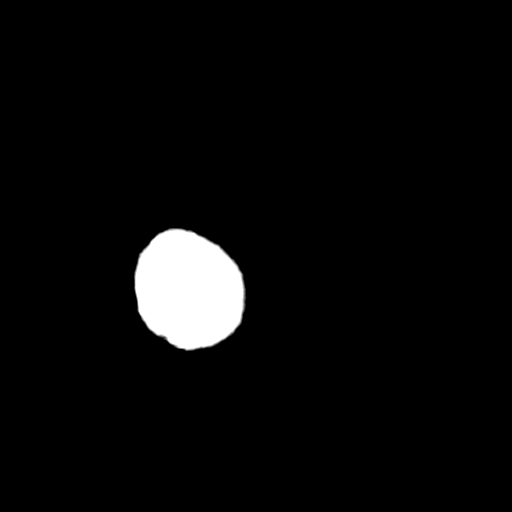

[16 of 30 positions shown; findings below may reference images not displayed]

FINDINGS: Brain: No evidence of acute infarction, hemorrhage, hydrocephalus,
extra-axial collection or mass lesion/mass effect. Mild
periventricular white matter hypodensity.

Vascular: No hyperdense vessel or unexpected calcification.

Skull: Normal. Negative for fracture or focal lesion.

Sinuses/Orbits: No acute finding.

Other: None.
IMPRESSION: No acute intracranial pathology. Mild small-vessel white matter
disease.

## 2021-04-03 IMAGING — DX DG CHEST 1V PORT
1 series · 1 of 1 positions shown · non-contrast
Comparison: 12/14/2018

CLINICAL DATA: Fever

EXAM:
PORTABLE CHEST 1 VIEW

[chest ap]
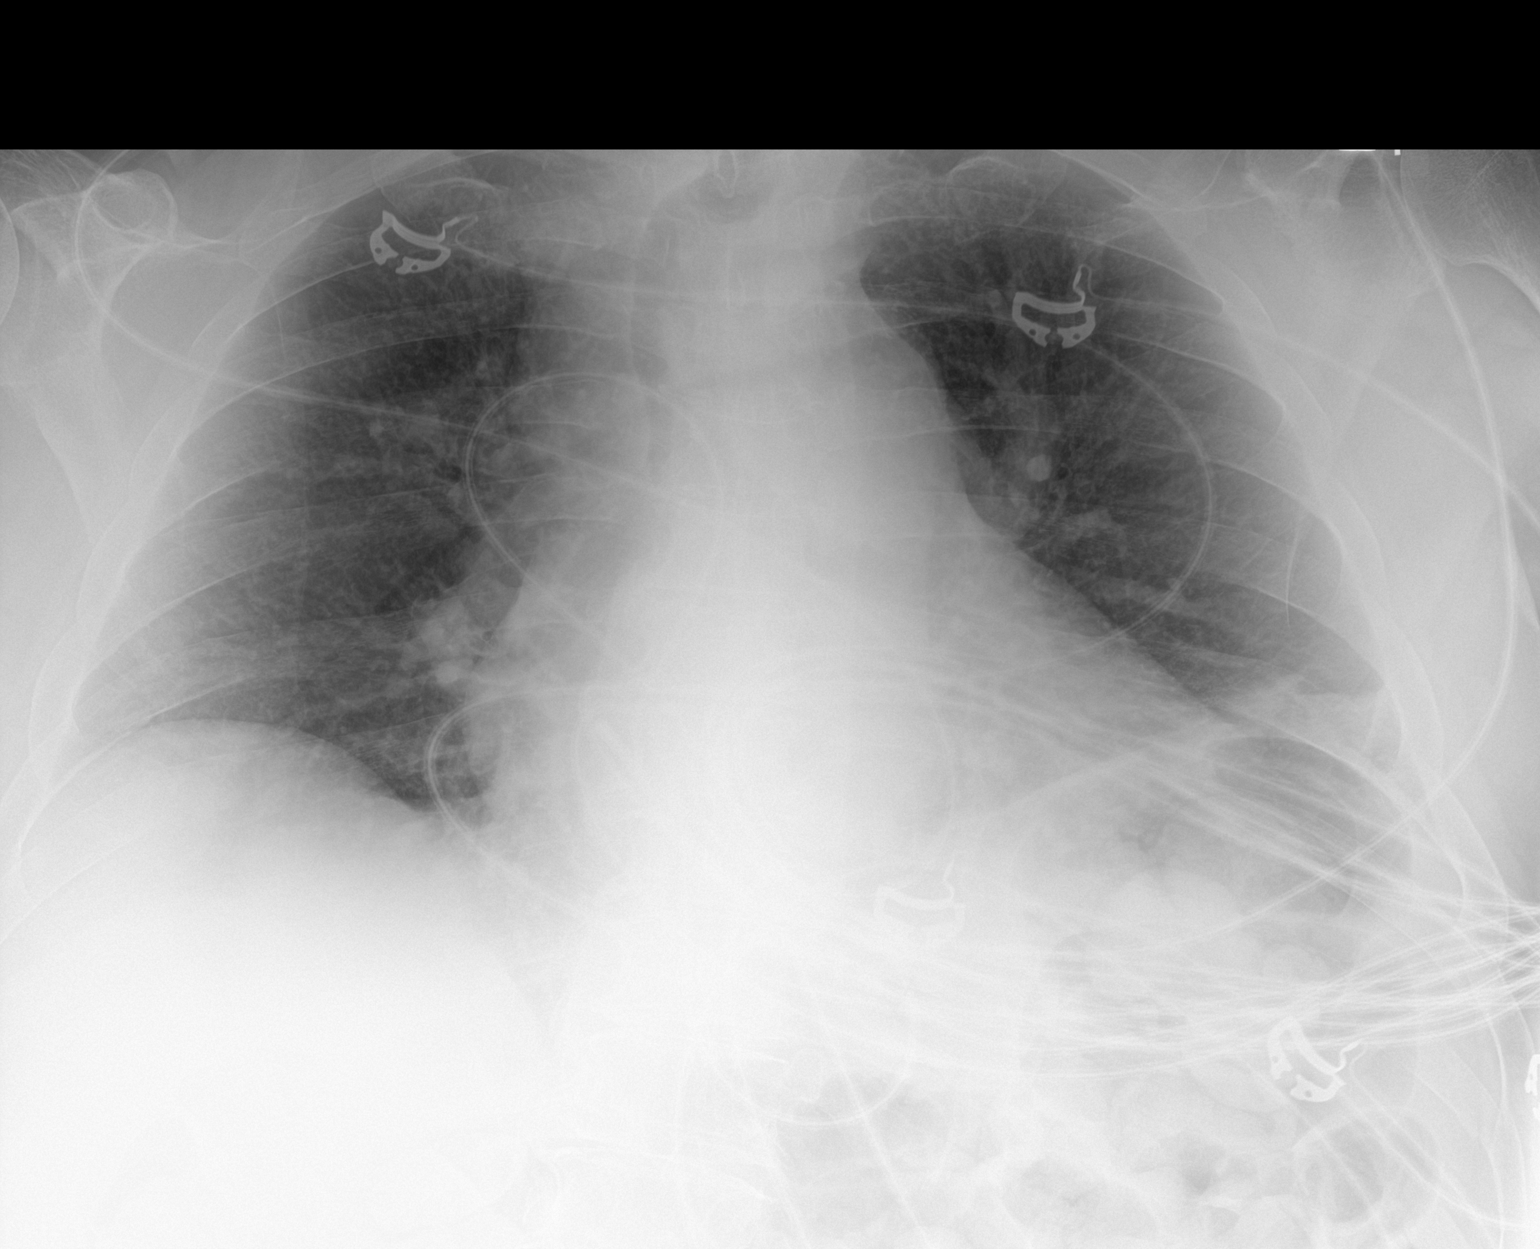

[1 of 1 positions shown; findings below may reference images not displayed]

FINDINGS: Low lung volumes. Mild cardiomegaly. Mild airspace disease at the
left base. No pneumothorax.
IMPRESSION: 1. Mild airspace disease at the peripheral left base, atelectasis
versus mild pneumonia
2. Low lung volumes.  Mild cardiomegaly
